# Patient Record
Sex: Male | Born: 1945 | Race: White | Hispanic: No | Marital: Married | Smoking: Former smoker
Health system: Southern US, Community
[De-identification: ages and names within clinical notes are randomized; demographics above are authoritative.]

## PROBLEM LIST (undated history)

## (undated) DIAGNOSIS — K5730 Diverticulosis of large intestine without perforation or abscess without bleeding: Secondary | ICD-10-CM

## (undated) DIAGNOSIS — R5381 Other malaise: Secondary | ICD-10-CM

## (undated) DIAGNOSIS — I10 Essential (primary) hypertension: Secondary | ICD-10-CM

## (undated) DIAGNOSIS — E119 Type 2 diabetes mellitus without complications: Secondary | ICD-10-CM

## (undated) DIAGNOSIS — J309 Allergic rhinitis, unspecified: Secondary | ICD-10-CM

## (undated) DIAGNOSIS — R58 Hemorrhage, not elsewhere classified: Secondary | ICD-10-CM

## (undated) DIAGNOSIS — I219 Acute myocardial infarction, unspecified: Secondary | ICD-10-CM

## (undated) DIAGNOSIS — I951 Orthostatic hypotension: Secondary | ICD-10-CM

## (undated) DIAGNOSIS — H269 Unspecified cataract: Secondary | ICD-10-CM

## (undated) DIAGNOSIS — T7840XA Allergy, unspecified, initial encounter: Secondary | ICD-10-CM

## (undated) DIAGNOSIS — I255 Ischemic cardiomyopathy: Secondary | ICD-10-CM

## (undated) DIAGNOSIS — F329 Major depressive disorder, single episode, unspecified: Secondary | ICD-10-CM

## (undated) DIAGNOSIS — Z95810 Presence of automatic (implantable) cardiac defibrillator: Secondary | ICD-10-CM

## (undated) DIAGNOSIS — I472 Ventricular tachycardia, unspecified: Secondary | ICD-10-CM

## (undated) DIAGNOSIS — F528 Other sexual dysfunction not due to a substance or known physiological condition: Secondary | ICD-10-CM

## (undated) DIAGNOSIS — Z860100 Personal history of colon polyps, unspecified: Secondary | ICD-10-CM

## (undated) DIAGNOSIS — M1990 Unspecified osteoarthritis, unspecified site: Secondary | ICD-10-CM

## (undated) DIAGNOSIS — Z87891 Personal history of nicotine dependence: Secondary | ICD-10-CM

## (undated) DIAGNOSIS — K683 Retroperitoneal hematoma: Secondary | ICD-10-CM

## (undated) DIAGNOSIS — K589 Irritable bowel syndrome without diarrhea: Secondary | ICD-10-CM

## (undated) DIAGNOSIS — I779 Disorder of arteries and arterioles, unspecified: Secondary | ICD-10-CM

## (undated) DIAGNOSIS — R5383 Other fatigue: Secondary | ICD-10-CM

## (undated) DIAGNOSIS — K222 Esophageal obstruction: Secondary | ICD-10-CM

## (undated) DIAGNOSIS — E785 Hyperlipidemia, unspecified: Secondary | ICD-10-CM

## (undated) DIAGNOSIS — Z9089 Acquired absence of other organs: Secondary | ICD-10-CM

## (undated) DIAGNOSIS — R351 Nocturia: Secondary | ICD-10-CM

## (undated) DIAGNOSIS — E669 Obesity, unspecified: Secondary | ICD-10-CM

## (undated) DIAGNOSIS — K219 Gastro-esophageal reflux disease without esophagitis: Secondary | ICD-10-CM

## (undated) DIAGNOSIS — I2510 Atherosclerotic heart disease of native coronary artery without angina pectoris: Secondary | ICD-10-CM

## (undated) DIAGNOSIS — F32A Depression, unspecified: Secondary | ICD-10-CM

## (undated) DIAGNOSIS — F4310 Post-traumatic stress disorder, unspecified: Secondary | ICD-10-CM

## (undated) DIAGNOSIS — F419 Anxiety disorder, unspecified: Secondary | ICD-10-CM

## (undated) DIAGNOSIS — I5022 Chronic systolic (congestive) heart failure: Secondary | ICD-10-CM

## (undated) DIAGNOSIS — C801 Malignant (primary) neoplasm, unspecified: Secondary | ICD-10-CM

## (undated) DIAGNOSIS — K649 Unspecified hemorrhoids: Secondary | ICD-10-CM

## (undated) DIAGNOSIS — I5042 Chronic combined systolic (congestive) and diastolic (congestive) heart failure: Secondary | ICD-10-CM

## (undated) DIAGNOSIS — R232 Flushing: Secondary | ICD-10-CM

## (undated) HISTORY — DX: Esophageal obstruction: K22.2

## (undated) HISTORY — DX: Personal history of colonic polyps: Z86.010

## (undated) HISTORY — DX: Personal history of colon polyps, unspecified: Z86.0100

## (undated) HISTORY — DX: Unspecified hemorrhoids: K64.9

## (undated) HISTORY — DX: Atherosclerotic heart disease of native coronary artery without angina pectoris: I25.10

## (undated) HISTORY — PX: ADENOIDECTOMY: SUR15

## (undated) HISTORY — DX: Orthostatic hypotension: I95.1

## (undated) HISTORY — DX: Irritable bowel syndrome, unspecified: K58.9

## (undated) HISTORY — DX: Essential (primary) hypertension: I10

## (undated) HISTORY — PX: SHOULDER SURGERY: SHX246

## (undated) HISTORY — PX: COLON RESECTION: SHX5231

## (undated) HISTORY — DX: Ventricular tachycardia, unspecified: I47.20

## (undated) HISTORY — DX: Diverticulosis of large intestine without perforation or abscess without bleeding: K57.30

## (undated) HISTORY — DX: Type 2 diabetes mellitus without complications: E11.9

## (undated) HISTORY — DX: Hemorrhage, not elsewhere classified: R58

## (undated) HISTORY — DX: Allergy, unspecified, initial encounter: T78.40XA

## (undated) HISTORY — PX: POLYPECTOMY: SHX149

## (undated) HISTORY — PX: CHOLECYSTECTOMY: SHX55

## (undated) HISTORY — DX: Other sexual dysfunction not due to a substance or known physiological condition: F52.8

## (undated) HISTORY — PX: ELBOW BURSA SURGERY: SHX615

## (undated) HISTORY — DX: Personal history of nicotine dependence: Z87.891

## (undated) HISTORY — DX: Disorder of arteries and arterioles, unspecified: I77.9

## (undated) HISTORY — DX: Presence of automatic (implantable) cardiac defibrillator: Z95.810

## (undated) HISTORY — PX: CORONARY ANGIOPLASTY: SHX604

## (undated) HISTORY — DX: Nocturia: R35.1

## (undated) HISTORY — DX: Other fatigue: R53.83

## (undated) HISTORY — DX: Chronic systolic (congestive) heart failure: I50.22

## (undated) HISTORY — DX: Anxiety disorder, unspecified: F41.9

## (undated) HISTORY — DX: Gastro-esophageal reflux disease without esophagitis: K21.9

## (undated) HISTORY — PX: CARPAL TUNNEL RELEASE: SHX101

## (undated) HISTORY — DX: Ventricular tachycardia: I47.2

## (undated) HISTORY — DX: Hyperlipidemia, unspecified: E78.5

## (undated) HISTORY — DX: Chronic combined systolic (congestive) and diastolic (congestive) heart failure: I50.42

## (undated) HISTORY — DX: Retroperitoneal hematoma: K68.3

## (undated) HISTORY — DX: Unspecified cataract: H26.9

## (undated) HISTORY — DX: Obesity, unspecified: E66.9

## (undated) HISTORY — DX: Acquired absence of other organs: Z90.89

## (undated) HISTORY — DX: Unspecified osteoarthritis, unspecified site: M19.90

## (undated) HISTORY — PX: TONSILLECTOMY: SUR1361

## (undated) HISTORY — DX: Malignant (primary) neoplasm, unspecified: C80.1

## (undated) HISTORY — DX: Ischemic cardiomyopathy: I25.5

## (undated) HISTORY — DX: Allergic rhinitis, unspecified: J30.9

## (undated) HISTORY — DX: Other malaise: R53.81

## (undated) HISTORY — PX: EYE SURGERY: SHX253

## (undated) HISTORY — DX: Flushing: R23.2

## (undated) SURGICAL SUPPLY — 1 items: POUCH AIGIS-R ANTIBACT ICD (Mesh General) ×1 IMPLANT

---

## 1989-08-02 ENCOUNTER — Encounter: Payer: Self-pay | Admitting: Gastroenterology

## 2000-01-20 ENCOUNTER — Emergency Department (HOSPITAL_COMMUNITY): Admission: EM | Admit: 2000-01-20 | Discharge: 2000-01-20 | Payer: Self-pay | Admitting: Emergency Medicine

## 2001-10-08 ENCOUNTER — Encounter: Payer: Self-pay | Admitting: Internal Medicine

## 2001-10-11 ENCOUNTER — Encounter: Payer: Self-pay | Admitting: Internal Medicine

## 2003-03-04 ENCOUNTER — Encounter: Payer: Self-pay | Admitting: Internal Medicine

## 2003-07-07 ENCOUNTER — Encounter: Payer: Self-pay | Admitting: Internal Medicine

## 2004-07-01 ENCOUNTER — Ambulatory Visit: Payer: Self-pay | Admitting: Internal Medicine

## 2004-08-03 ENCOUNTER — Ambulatory Visit: Payer: Self-pay | Admitting: Internal Medicine

## 2004-08-16 ENCOUNTER — Ambulatory Visit: Payer: Self-pay | Admitting: Internal Medicine

## 2004-08-26 ENCOUNTER — Ambulatory Visit: Payer: Self-pay | Admitting: Internal Medicine

## 2004-09-02 ENCOUNTER — Ambulatory Visit: Payer: Self-pay | Admitting: Internal Medicine

## 2004-10-15 ENCOUNTER — Ambulatory Visit: Payer: Self-pay | Admitting: Internal Medicine

## 2004-11-09 ENCOUNTER — Ambulatory Visit: Payer: Self-pay | Admitting: Internal Medicine

## 2004-11-28 ENCOUNTER — Emergency Department (HOSPITAL_COMMUNITY): Admission: EM | Admit: 2004-11-28 | Discharge: 2004-11-29 | Payer: Self-pay | Admitting: Emergency Medicine

## 2004-11-30 ENCOUNTER — Ambulatory Visit: Payer: Self-pay | Admitting: Internal Medicine

## 2005-01-03 ENCOUNTER — Ambulatory Visit: Payer: Self-pay | Admitting: Cardiology

## 2005-01-07 ENCOUNTER — Ambulatory Visit: Payer: Self-pay | Admitting: Internal Medicine

## 2005-02-15 ENCOUNTER — Ambulatory Visit: Payer: Self-pay | Admitting: Internal Medicine

## 2005-02-17 ENCOUNTER — Ambulatory Visit: Payer: Self-pay | Admitting: Internal Medicine

## 2005-03-14 ENCOUNTER — Ambulatory Visit: Payer: Self-pay | Admitting: Internal Medicine

## 2005-04-13 ENCOUNTER — Ambulatory Visit: Payer: Self-pay | Admitting: Internal Medicine

## 2005-04-21 ENCOUNTER — Ambulatory Visit: Payer: Self-pay | Admitting: Internal Medicine

## 2005-04-28 ENCOUNTER — Ambulatory Visit: Payer: Self-pay | Admitting: Internal Medicine

## 2005-05-11 ENCOUNTER — Ambulatory Visit: Payer: Self-pay | Admitting: Internal Medicine

## 2005-06-24 ENCOUNTER — Ambulatory Visit: Payer: Self-pay | Admitting: Internal Medicine

## 2005-06-30 ENCOUNTER — Ambulatory Visit: Payer: Self-pay | Admitting: Cardiology

## 2005-08-05 ENCOUNTER — Ambulatory Visit: Payer: Self-pay | Admitting: Internal Medicine

## 2005-08-25 ENCOUNTER — Ambulatory Visit: Payer: Self-pay | Admitting: Internal Medicine

## 2005-09-12 ENCOUNTER — Ambulatory Visit: Payer: Self-pay | Admitting: Internal Medicine

## 2005-09-13 ENCOUNTER — Ambulatory Visit: Payer: Self-pay | Admitting: Internal Medicine

## 2005-11-07 ENCOUNTER — Ambulatory Visit: Payer: Self-pay | Admitting: Internal Medicine

## 2005-11-14 ENCOUNTER — Ambulatory Visit: Payer: Self-pay | Admitting: Internal Medicine

## 2005-12-07 ENCOUNTER — Ambulatory Visit: Payer: Self-pay | Admitting: Internal Medicine

## 2005-12-14 ENCOUNTER — Ambulatory Visit: Payer: Self-pay | Admitting: Cardiology

## 2005-12-20 ENCOUNTER — Ambulatory Visit: Payer: Self-pay | Admitting: Internal Medicine

## 2005-12-22 ENCOUNTER — Ambulatory Visit: Payer: Self-pay | Admitting: Cardiology

## 2006-01-31 ENCOUNTER — Ambulatory Visit: Payer: Self-pay | Admitting: Internal Medicine

## 2006-02-01 ENCOUNTER — Ambulatory Visit: Payer: Self-pay | Admitting: Cardiology

## 2006-02-28 ENCOUNTER — Ambulatory Visit: Payer: Self-pay | Admitting: Internal Medicine

## 2006-03-13 ENCOUNTER — Ambulatory Visit: Payer: Self-pay | Admitting: Internal Medicine

## 2006-04-20 ENCOUNTER — Ambulatory Visit: Payer: Self-pay | Admitting: Internal Medicine

## 2006-06-02 ENCOUNTER — Ambulatory Visit: Payer: Self-pay | Admitting: Internal Medicine

## 2006-06-22 ENCOUNTER — Ambulatory Visit: Payer: Self-pay | Admitting: Internal Medicine

## 2006-07-18 ENCOUNTER — Ambulatory Visit: Payer: Self-pay | Admitting: Pulmonary Disease

## 2006-08-02 ENCOUNTER — Ambulatory Visit: Payer: Self-pay | Admitting: Internal Medicine

## 2006-08-08 ENCOUNTER — Ambulatory Visit: Payer: Self-pay | Admitting: Cardiology

## 2006-08-08 LAB — CONVERTED CEMR LAB
Chol/HDL Ratio, serum: 2.7
Cholesterol: 156 mg/dL (ref 0–200)
HDL: 57.7 mg/dL (ref >39.0)
LDL Cholesterol: 60 mg/dL (ref 0–99)
Total Bilirubin: 0.8 mg/dL (ref 0.3–1.2)
Total Protein: 6.7 g/dL (ref 6.0–8.3)

## 2006-08-15 ENCOUNTER — Ambulatory Visit: Payer: Self-pay | Admitting: Cardiology

## 2006-08-17 ENCOUNTER — Ambulatory Visit: Payer: Self-pay | Admitting: Internal Medicine

## 2006-08-23 ENCOUNTER — Ambulatory Visit: Payer: Self-pay | Admitting: Internal Medicine

## 2006-08-25 ENCOUNTER — Ambulatory Visit: Payer: Self-pay | Admitting: Internal Medicine

## 2006-08-31 ENCOUNTER — Ambulatory Visit: Payer: Self-pay | Admitting: Internal Medicine

## 2006-09-05 ENCOUNTER — Ambulatory Visit: Payer: Self-pay | Admitting: Internal Medicine

## 2006-09-07 ENCOUNTER — Ambulatory Visit: Payer: Self-pay | Admitting: Internal Medicine

## 2006-09-12 ENCOUNTER — Ambulatory Visit: Payer: Self-pay | Admitting: Internal Medicine

## 2006-09-18 ENCOUNTER — Ambulatory Visit: Payer: Self-pay | Admitting: Internal Medicine

## 2006-09-21 ENCOUNTER — Ambulatory Visit: Payer: Self-pay | Admitting: Internal Medicine

## 2006-09-25 ENCOUNTER — Ambulatory Visit: Payer: Self-pay | Admitting: Internal Medicine

## 2006-10-02 ENCOUNTER — Ambulatory Visit: Payer: Self-pay | Admitting: Internal Medicine

## 2006-10-06 ENCOUNTER — Ambulatory Visit: Payer: Self-pay | Admitting: Internal Medicine

## 2006-10-10 ENCOUNTER — Ambulatory Visit: Payer: Self-pay | Admitting: Internal Medicine

## 2006-10-10 LAB — CONVERTED CEMR LAB
AST: 25 units/L (ref 0–37)
BUN: 10 mg/dL (ref 6–23)
Basophils Absolute: 0.1 10*3/uL (ref 0.0–0.1)
Cholesterol: 124 mg/dL (ref 0–200)
Eosinophils Absolute: 0.1 10*3/uL (ref 0.0–0.6)
Hemoglobin: 17.7 g/dL — ABNORMAL HIGH (ref 13.0–17.0)
LDL Cholesterol: 40 mg/dL (ref 0–99)
Lymphocytes Relative: 24.1 % (ref 12.0–46.0)
Neutro Abs: 5.7 10*3/uL (ref 1.4–7.7)
Neutrophils Relative %: 67.1 % (ref 43.0–77.0)
Platelets: 181 10*3/uL (ref 150–400)
RBC: 5.84 M/uL — ABNORMAL HIGH (ref 4.22–5.81)
TSH: 0.96 microintl units/mL (ref 0.35–5.50)
Testosterone: 161.05 ng/dL — ABNORMAL LOW (ref 350.00–890)
Total CHOL/HDL Ratio: 2.5
VLDL: 35 mg/dL (ref 0–40)
Vitamin B-12: 292 pg/mL (ref 211–911)
WBC: 8.5 10*3/uL (ref 4.5–10.5)

## 2006-10-16 ENCOUNTER — Ambulatory Visit: Payer: Self-pay | Admitting: Internal Medicine

## 2006-10-18 ENCOUNTER — Ambulatory Visit: Payer: Self-pay | Admitting: Internal Medicine

## 2006-10-20 ENCOUNTER — Ambulatory Visit: Payer: Self-pay | Admitting: Internal Medicine

## 2006-10-24 ENCOUNTER — Ambulatory Visit: Payer: Self-pay | Admitting: Internal Medicine

## 2006-11-06 ENCOUNTER — Ambulatory Visit: Payer: Self-pay | Admitting: Internal Medicine

## 2006-11-16 ENCOUNTER — Ambulatory Visit: Payer: Self-pay | Admitting: Internal Medicine

## 2006-11-20 ENCOUNTER — Ambulatory Visit: Payer: Self-pay | Admitting: Internal Medicine

## 2006-11-27 ENCOUNTER — Ambulatory Visit: Payer: Self-pay | Admitting: Internal Medicine

## 2006-12-01 ENCOUNTER — Ambulatory Visit: Payer: Self-pay | Admitting: Internal Medicine

## 2006-12-06 ENCOUNTER — Ambulatory Visit: Payer: Self-pay | Admitting: Internal Medicine

## 2006-12-13 ENCOUNTER — Ambulatory Visit: Payer: Self-pay | Admitting: Internal Medicine

## 2006-12-21 ENCOUNTER — Ambulatory Visit: Payer: Self-pay | Admitting: Internal Medicine

## 2006-12-28 ENCOUNTER — Ambulatory Visit: Payer: Self-pay | Admitting: Internal Medicine

## 2007-01-03 ENCOUNTER — Ambulatory Visit: Payer: Self-pay | Admitting: Internal Medicine

## 2007-01-04 ENCOUNTER — Ambulatory Visit: Payer: Self-pay | Admitting: Internal Medicine

## 2007-01-05 ENCOUNTER — Ambulatory Visit: Payer: Self-pay | Admitting: Internal Medicine

## 2007-01-05 LAB — CONVERTED CEMR LAB
CO2: 33 meq/L — ABNORMAL HIGH (ref 19–32)
Calcium: 10.3 mg/dL (ref 8.4–10.5)
Chloride: 100 meq/L (ref 96–112)
GFR calc non Af Amer: 81 mL/min
Glucose, Bld: 123 mg/dL — ABNORMAL HIGH (ref 70–99)
PSA: 1.68 ng/mL (ref 0.10–4.00)
Sodium: 139 meq/L (ref 135–145)
TSH: 1.07 microintl units/mL (ref 0.35–5.50)
Testosterone: 203.26 ng/dL — ABNORMAL LOW (ref 350.00–890)

## 2007-01-10 ENCOUNTER — Ambulatory Visit: Payer: Self-pay | Admitting: Internal Medicine

## 2007-01-16 ENCOUNTER — Ambulatory Visit: Payer: Self-pay | Admitting: Internal Medicine

## 2007-01-23 ENCOUNTER — Ambulatory Visit: Payer: Self-pay | Admitting: Internal Medicine

## 2007-01-24 ENCOUNTER — Encounter: Payer: Self-pay | Admitting: Endocrinology

## 2007-01-24 DIAGNOSIS — Z9089 Acquired absence of other organs: Secondary | ICD-10-CM | POA: Insufficient documentation

## 2007-01-24 DIAGNOSIS — Z860100 Personal history of colon polyps, unspecified: Secondary | ICD-10-CM | POA: Insufficient documentation

## 2007-01-24 DIAGNOSIS — K5730 Diverticulosis of large intestine without perforation or abscess without bleeding: Secondary | ICD-10-CM | POA: Insufficient documentation

## 2007-01-24 DIAGNOSIS — I25119 Atherosclerotic heart disease of native coronary artery with unspecified angina pectoris: Secondary | ICD-10-CM | POA: Insufficient documentation

## 2007-01-24 DIAGNOSIS — K589 Irritable bowel syndrome without diarrhea: Secondary | ICD-10-CM | POA: Insufficient documentation

## 2007-01-24 DIAGNOSIS — I2510 Atherosclerotic heart disease of native coronary artery without angina pectoris: Secondary | ICD-10-CM

## 2007-01-24 DIAGNOSIS — F528 Other sexual dysfunction not due to a substance or known physiological condition: Secondary | ICD-10-CM | POA: Insufficient documentation

## 2007-01-24 DIAGNOSIS — E785 Hyperlipidemia, unspecified: Secondary | ICD-10-CM | POA: Insufficient documentation

## 2007-01-24 DIAGNOSIS — K219 Gastro-esophageal reflux disease without esophagitis: Secondary | ICD-10-CM | POA: Insufficient documentation

## 2007-01-24 DIAGNOSIS — E669 Obesity, unspecified: Secondary | ICD-10-CM | POA: Insufficient documentation

## 2007-01-24 DIAGNOSIS — I10 Essential (primary) hypertension: Secondary | ICD-10-CM | POA: Insufficient documentation

## 2007-01-31 ENCOUNTER — Ambulatory Visit: Payer: Self-pay | Admitting: Internal Medicine

## 2007-02-05 ENCOUNTER — Ambulatory Visit: Payer: Self-pay | Admitting: Internal Medicine

## 2007-02-13 ENCOUNTER — Ambulatory Visit: Payer: Self-pay | Admitting: Internal Medicine

## 2007-02-23 ENCOUNTER — Ambulatory Visit: Payer: Self-pay | Admitting: Internal Medicine

## 2007-03-01 ENCOUNTER — Ambulatory Visit: Payer: Self-pay | Admitting: Internal Medicine

## 2007-03-09 ENCOUNTER — Ambulatory Visit: Payer: Self-pay | Admitting: Internal Medicine

## 2007-03-16 ENCOUNTER — Ambulatory Visit: Payer: Self-pay | Admitting: Internal Medicine

## 2007-03-20 ENCOUNTER — Ambulatory Visit: Payer: Self-pay | Admitting: Internal Medicine

## 2007-03-26 ENCOUNTER — Ambulatory Visit: Payer: Self-pay | Admitting: Internal Medicine

## 2007-03-26 ENCOUNTER — Encounter: Payer: Self-pay | Admitting: Internal Medicine

## 2007-03-29 ENCOUNTER — Ambulatory Visit: Payer: Self-pay | Admitting: Internal Medicine

## 2007-03-31 ENCOUNTER — Emergency Department (HOSPITAL_COMMUNITY): Admission: EM | Admit: 2007-03-31 | Discharge: 2007-03-31 | Payer: Self-pay | Admitting: Emergency Medicine

## 2007-04-02 ENCOUNTER — Ambulatory Visit: Payer: Self-pay | Admitting: Internal Medicine

## 2007-04-13 ENCOUNTER — Ambulatory Visit: Payer: Self-pay | Admitting: Internal Medicine

## 2007-04-18 ENCOUNTER — Ambulatory Visit: Payer: Self-pay | Admitting: Internal Medicine

## 2007-04-24 ENCOUNTER — Ambulatory Visit: Payer: Self-pay | Admitting: Internal Medicine

## 2007-05-04 ENCOUNTER — Ambulatory Visit: Payer: Self-pay | Admitting: Internal Medicine

## 2007-05-14 ENCOUNTER — Ambulatory Visit: Payer: Self-pay | Admitting: Internal Medicine

## 2007-05-25 ENCOUNTER — Ambulatory Visit: Payer: Self-pay | Admitting: Internal Medicine

## 2007-06-01 ENCOUNTER — Ambulatory Visit: Payer: Self-pay | Admitting: Internal Medicine

## 2007-06-11 ENCOUNTER — Emergency Department (HOSPITAL_COMMUNITY): Admission: EM | Admit: 2007-06-11 | Discharge: 2007-06-12 | Payer: Self-pay | Admitting: Emergency Medicine

## 2007-06-11 ENCOUNTER — Ambulatory Visit: Payer: Self-pay | Admitting: Internal Medicine

## 2007-06-13 ENCOUNTER — Ambulatory Visit: Payer: Self-pay | Admitting: Internal Medicine

## 2007-06-18 ENCOUNTER — Encounter: Payer: Self-pay | Admitting: Internal Medicine

## 2007-06-20 ENCOUNTER — Ambulatory Visit: Payer: Self-pay | Admitting: Pulmonary Disease

## 2007-06-20 ENCOUNTER — Ambulatory Visit: Payer: Self-pay | Admitting: Internal Medicine

## 2007-06-21 ENCOUNTER — Ambulatory Visit: Payer: Self-pay | Admitting: Internal Medicine

## 2007-06-29 ENCOUNTER — Ambulatory Visit: Payer: Self-pay | Admitting: Internal Medicine

## 2007-07-09 ENCOUNTER — Ambulatory Visit: Payer: Self-pay | Admitting: Internal Medicine

## 2007-07-10 ENCOUNTER — Ambulatory Visit: Payer: Self-pay | Admitting: Internal Medicine

## 2007-07-17 ENCOUNTER — Ambulatory Visit: Payer: Self-pay | Admitting: Internal Medicine

## 2007-07-23 ENCOUNTER — Encounter: Payer: Self-pay | Admitting: Internal Medicine

## 2007-08-09 ENCOUNTER — Telehealth: Payer: Self-pay | Admitting: Internal Medicine

## 2007-08-09 ENCOUNTER — Ambulatory Visit: Payer: Self-pay | Admitting: Internal Medicine

## 2007-08-16 ENCOUNTER — Ambulatory Visit: Payer: Self-pay | Admitting: Internal Medicine

## 2007-08-16 DIAGNOSIS — J3089 Other allergic rhinitis: Secondary | ICD-10-CM | POA: Insufficient documentation

## 2007-08-27 ENCOUNTER — Ambulatory Visit: Payer: Self-pay | Admitting: Internal Medicine

## 2007-09-07 ENCOUNTER — Ambulatory Visit: Payer: Self-pay | Admitting: Internal Medicine

## 2007-09-07 DIAGNOSIS — E1169 Type 2 diabetes mellitus with other specified complication: Secondary | ICD-10-CM | POA: Insufficient documentation

## 2007-09-07 DIAGNOSIS — R5381 Other malaise: Secondary | ICD-10-CM | POA: Insufficient documentation

## 2007-09-07 DIAGNOSIS — R5383 Other fatigue: Secondary | ICD-10-CM | POA: Insufficient documentation

## 2007-09-07 DIAGNOSIS — E119 Type 2 diabetes mellitus without complications: Secondary | ICD-10-CM

## 2007-09-07 LAB — CONVERTED CEMR LAB
ALT: 39 units/L (ref 0–53)
Albumin: 4.2 g/dL (ref 3.5–5.2)
Alkaline Phosphatase: 75 units/L (ref 39–117)
Bilirubin, Direct: 0.1 mg/dL (ref 0.0–0.3)
CO2: 27 meq/L (ref 19–32)
Cholesterol: 135 mg/dL (ref 0–200)
Folate: 9.3 ng/mL
GFR calc Af Amer: 111 mL/min
GFR calc non Af Amer: 91 mL/min
HDL: 49.5 mg/dL (ref >39.0)
Hgb A1c MFr Bld: 6.6 % — ABNORMAL HIGH (ref 4.6–6.0)
LDL Cholesterol: 46 mg/dL (ref 0–99)
Potassium: 4 meq/L (ref 3.5–5.1)
Vitamin B-12: 250 pg/mL (ref 211–911)

## 2007-09-09 ENCOUNTER — Encounter: Payer: Self-pay | Admitting: Internal Medicine

## 2007-09-14 ENCOUNTER — Ambulatory Visit: Payer: Self-pay | Admitting: Internal Medicine

## 2007-09-17 ENCOUNTER — Ambulatory Visit: Payer: Self-pay | Admitting: Internal Medicine

## 2007-09-21 ENCOUNTER — Ambulatory Visit: Payer: Self-pay | Admitting: Internal Medicine

## 2007-09-27 ENCOUNTER — Telehealth: Payer: Self-pay | Admitting: Internal Medicine

## 2007-10-01 ENCOUNTER — Ambulatory Visit: Payer: Self-pay | Admitting: Internal Medicine

## 2007-10-09 ENCOUNTER — Telehealth: Payer: Self-pay | Admitting: Internal Medicine

## 2007-10-09 ENCOUNTER — Ambulatory Visit: Payer: Self-pay | Admitting: Internal Medicine

## 2007-10-11 ENCOUNTER — Ambulatory Visit: Payer: Self-pay | Admitting: Internal Medicine

## 2007-10-23 ENCOUNTER — Ambulatory Visit: Payer: Self-pay | Admitting: Internal Medicine

## 2007-11-02 ENCOUNTER — Ambulatory Visit: Payer: Self-pay | Admitting: Internal Medicine

## 2007-11-07 ENCOUNTER — Ambulatory Visit: Payer: Self-pay | Admitting: Internal Medicine

## 2007-11-22 ENCOUNTER — Ambulatory Visit: Payer: Self-pay | Admitting: Internal Medicine

## 2007-12-11 ENCOUNTER — Ambulatory Visit: Payer: Self-pay | Admitting: Internal Medicine

## 2007-12-11 DIAGNOSIS — R232 Flushing: Secondary | ICD-10-CM | POA: Insufficient documentation

## 2007-12-11 DIAGNOSIS — R351 Nocturia: Secondary | ICD-10-CM | POA: Insufficient documentation

## 2007-12-11 LAB — CONVERTED CEMR LAB
CO2: 31 meq/L (ref 19–32)
Chloride: 99 meq/L (ref 96–112)
Cortisol, Plasma: 1.8 ug/dL
Creatinine, Ser: 1 mg/dL (ref 0.4–1.5)
Direct LDL: 54.9 mg/dL
GFR calc Af Amer: 98 mL/min
Glucose, Bld: 141 mg/dL — ABNORMAL HIGH (ref 70–99)
HDL: 57.3 mg/dL (ref >39.0)
Potassium: 3.7 meq/L (ref 3.5–5.1)
Sodium: 137 meq/L (ref 135–145)
Total CHOL/HDL Ratio: 2.5
Triglycerides: 255 mg/dL (ref 0–149)
VLDL: 51 mg/dL — ABNORMAL HIGH (ref 0–40)

## 2007-12-13 ENCOUNTER — Ambulatory Visit: Payer: Self-pay | Admitting: *Deleted

## 2007-12-14 ENCOUNTER — Encounter: Payer: Self-pay | Admitting: Internal Medicine

## 2007-12-14 ENCOUNTER — Inpatient Hospital Stay (HOSPITAL_COMMUNITY): Admission: EM | Admit: 2007-12-14 | Discharge: 2007-12-14 | Payer: Self-pay | Admitting: Emergency Medicine

## 2007-12-14 LAB — CONVERTED CEMR LAB
5-HIAA, 24 Hr Urine: 1.8 mg/(24.h) (ref <=6.0)
Epinephrine 24 Hr Urine: 1 mcg/24hr (ref <20)
Norepinephrine 24 Hr Urine: 52 mcg/24hr (ref <80)
Normetanephrine, 24H Ur: 152 (ref 122–676)

## 2007-12-15 ENCOUNTER — Encounter: Payer: Self-pay | Admitting: Internal Medicine

## 2007-12-18 ENCOUNTER — Telehealth: Payer: Self-pay | Admitting: Internal Medicine

## 2007-12-20 ENCOUNTER — Ambulatory Visit: Payer: Self-pay

## 2007-12-20 ENCOUNTER — Encounter: Payer: Self-pay | Admitting: Internal Medicine

## 2007-12-26 ENCOUNTER — Ambulatory Visit: Payer: Self-pay | Admitting: Internal Medicine

## 2008-01-01 ENCOUNTER — Ambulatory Visit: Payer: Self-pay | Admitting: Cardiology

## 2008-01-31 ENCOUNTER — Ambulatory Visit: Payer: Self-pay | Admitting: Internal Medicine

## 2008-02-04 ENCOUNTER — Telehealth: Payer: Self-pay | Admitting: Internal Medicine

## 2008-02-05 ENCOUNTER — Telehealth: Payer: Self-pay | Admitting: Internal Medicine

## 2008-08-27 ENCOUNTER — Encounter: Payer: Self-pay | Admitting: Internal Medicine

## 2009-01-01 ENCOUNTER — Telehealth: Payer: Self-pay | Admitting: Internal Medicine

## 2009-01-27 ENCOUNTER — Ambulatory Visit: Payer: Self-pay | Admitting: Cardiology

## 2009-01-27 ENCOUNTER — Encounter: Payer: Self-pay | Admitting: Cardiology

## 2010-06-18 ENCOUNTER — Ambulatory Visit: Payer: Self-pay | Admitting: Internal Medicine

## 2010-06-18 DIAGNOSIS — K5732 Diverticulitis of large intestine without perforation or abscess without bleeding: Secondary | ICD-10-CM | POA: Insufficient documentation

## 2010-09-28 NOTE — Procedures (Signed)
Summary: EGD/[place]  E[facility]   Imported By: Sherian Rein 06/21/2010 12:01:18  _____________________________________________________________________  External Attachment:    Type:   Image     Comment:   External Document

## 2010-09-28 NOTE — Procedures (Signed)
Summary: EGD   EGD  Procedure date:  03/26/2007  Findings:      Location: [facility]   [name], Cameron Daniel. MRN:  Procedure Procedures: Panendoscopy (EGD) CPT: 43235.    with biopsy(s)/brushing(s). CPT: [id number].    with maloney dilation of the esophagus Personnel: Endoscopist: Wilhemina Bonito. Marina Goodell, MD.  Exam Location: Exam performed in Outpatient Clinic. Outpatient  Patient Consent: Procedure, Alternatives, Risks and Benefits discussed, consent obtained, from patient. Consent was obtained by the RN.  Indications  Therapeutics: Reason for exam: Esophageal dilation.  Symptoms: Dysphagia. Reflux symptoms  History  Current Medications: Patient is not currently taking Coumadin.  Pre-Exam Physical: Performed Mar 26, 2007  Cardio-pulmonary exam, HEENT exam, Abdominal exam, Mental status exam WNL.  Comments: Pt. history reviewed/updated, physical exam performed prior to initiation of sedation?yes Exam Exam Info: Maximum depth of insertion Duodenum, intended Duodenum. Patient position: on left side. Vocal cords visualized. Gastric retroflexion performed. Images taken. ASA Classification: II. Tolerance: excellent.  Sedation Meds: Patient assessed and found to be appropriate for moderate (conscious) sedation. Residual sedation present from prior procedure today. Cetacaine Spray 2 sprays Versed 2 mg. given IV.  Monitoring: BP and pulse monitoring done. Oximetry used. Supplemental O2 given  Findings STRICTURE / STENOSIS: Stricture in Distal Esophagus.  Constriction: partial. Etiology: benign due to reflux. 40 cm from mouth. Lumen diameter is 15 mm. ICD9: Esophageal Stricture: 530.3. Comment: No Barrett's or esophagitis.  - Dilation: Distal Esophagus. Maloney dilator used, Diameter: 54 F, No Resistance, No Heme present on extraction. Patient tolerance excellent.  - Normal: Fundus to Duodenal 2nd Portion. Biopsy/Normal taken. Comments: mild antral  erythema (CLO BX).   Assessment  Diagnoses: 530.3: Esophageal Stricture. s/p dilation.  530.81: GERD.   Comments: Mild antral erythema r/o H. pylori Events  Unplanned Intervention: No unplanned interventions were required.  Unplanned Events: There were no complications. Plans Medication(s): Continue current medications.  Disposition: After procedure patient sent to recovery. After recovery patient sent home.  Scheduling: Follow-up prn.  This report was created from the original endoscopy report, which was reviewed and signed by the above listed endoscopist.

## 2010-09-28 NOTE — Consult Note (Signed)
Summary: [facility]   Imported By: Sherian Rein 06/21/2010 12:03:31  _____________________________________________________________________  External Attachment:    Type:   Image     Comment:   External Document

## 2010-09-28 NOTE — Procedures (Signed)
Summary: Colon   Colonoscopy  Procedure date:  03/26/2007  Findings:      Location:  [facility].   Patient Name: Cameron, Daniel. MRN:  Procedure Procedures: Colonoscopy CPT: [phone].    with polypectomy. CPT: A3573898.  Personnel: Endoscopist: Wilhemina Bonito. Marina Goodell, MD.  Exam Location: Exam performed in Outpatient Clinic. Outpatient  Patient Consent: Procedure, Alternatives, Risks and Benefits discussed, consent obtained, from patient. Consent was obtained by the RN.  Indications  Surveillance of: Adenomatous Polyp(s). This is an initial surveillance exam. Initial polypectomy was performed in 2003. in Feb. 3 or more Polyps were found at Index Exam. Largest polyp removed was 6 to 9 mm. Prior polyp located in both proximal and distal colon. Pathology of worst  polyp: tubular adenoma.  History  Current Medications: Patient is not currently taking Coumadin.  Pre-Exam Physical: Performed Mar 26, 2007. Cardio-pulmonary exam, Rectal exam, HEENT exam , Abdominal exam, Mental status exam WNL.  Comments: Pt. history reviewed/updated, physical exam performed prior to initiation of sedation?yes Exam Exam: Extent of exam reached: Cecum, extent intended: Cecum.  The cecum was identified by appendiceal orifice and IC valve. Patient position: on left side. Time to Cecum: 00:02:44. Time for Withdrawl: 00:08:47. Colon retroflexion performed. Images taken. ASA Classification: II. Tolerance: excellent.  Monitoring: Pulse and BP monitoring, Oximetry used. Supplemental O2 given.  Colon Prep Used osmo prep for colon prep. Prep results: good.  Sedation Meds: Patient assessed and found to be appropriate for moderate (conscious) sedation. Fentanyl 75 mcg. given IV. Versed 7 mg. given IV.  Findings MULTIPLE POLYPS: Hepatic Flexure. removed, Polyp retrieved, 3 polyps Polyps sent to pathology. ICD9: Colon Polyps: 211.3. Comments: 3mm, 38mm,4mm.  NORMAL EXAM: Cecum to Rectum.  -  DIVERTICULOSIS: Cecum to Sigmoid Colon. ICD9: Diverticulosis, Colon: 562.10. Comments: marked changes.   Assessment  Diagnoses: 211.3: Colon Polyps. x 3.  562.10: Diverticulosis, Colon.  455.6: Hemorrhoids, Internal and  External. small.   Events  Unplanned Interventions: No intervention was required.  Unplanned Events: There were no complications. Plans Disposition: After procedure patient sent remain in endo suite for second procedure.  Scheduling/Referral: Colonoscopy, to Wilhemina Bonito. Marina Goodell, MD, in 5 years,    This report was created from the original endoscopy report, which was reviewed and signed by the above listed endoscopist.

## 2010-09-28 NOTE — Procedures (Signed)
Summary: Colon   Colonoscopy  Procedure date:  07/07/2003  Findings:      Location:  [facility].    Colonoscopy  Procedure date:  07/07/2003  Findings:      Location:  [facility].   [name], Cameron Daniel. MRN:  Procedure Procedures: Colonoscopy CPT: [phone].    with polypectomy. CPT: A3573898.  Personnel: Endoscopist: Wilhemina Bonito. Marina Goodell, MD.  Exam Location: Exam performed in Outpatient Clinic. Outpatient  Patient Consent: Procedure, Alternatives, Risks and Benefits discussed, consent obtained, from patient. Consent was obtained by the RN.  Indications  Surveillance of: Adenomatous Polyp(s). This is an initial surveillance exam. Initial polypectomy was performed in 2003. in Feb. 3 or more Polyps were found at Index Exam. Largest polyp removed was 6 to 9 mm. Prior polyp located in both proximal and distal colon. Pathology of worst  polyp: tubular adenoma.  History  Current Medications: Patient is not currently taking Coumadin.  Pre-Exam Physical: Performed Jul 07, 2003. Entire physical exam was normal.  Exam Exam: Extent of exam reached: Cecum, extent intended: Cecum.  The cecum was identified by appendiceal orifice and IC valve. Patient position: on left side. Colon retroflexion performed. Images taken. ASA Classification: II. Tolerance: excellent.  Monitoring: Pulse and BP monitoring, Oximetry used. Supplemental O2 given.  Colon Prep Used Visicol for colon prep. Prep results: excellent.  Sedation Meds: Patient assessed and found to be appropriate for moderate (conscious) sedation. Fentanyl 100 mcg. given IV. Versed 5 mg. given IV.  Findings - MULTIPLE POLYPS: Transverse Colon. minimum size 3 mm, maximum size 4 mm. Procedure:  snare without cautery, removed, Polyp retrieved, 3 polyps Polyps sent to pathology. ICD9: Colon Polyps: 211.3.  NORMAL EXAM: Cecum to Rectum.  - DIVERTICULOSIS: Ascending Colon to Sigmoid Colon.  ICD9: Diverticulosis, Colon: 562.10.  POLYP: Sigmoid Colon, diminutive, Procedure:  snare with cautery, removed, retrieved, Polyp sent to pathology.   Assessment Abnormal examination, see findings above.  Diagnoses: 211.3: Colon Polyps.  562.10: Diverticulosis, Colon.   Events  Unplanned Interventions: No intervention was required.  Unplanned Events: There were no complications. Plans Patient Education: Patient given standard instructions for: Polyps.  Disposition: After procedure patient sent to recovery. After recovery patient sent home.  Scheduling/Referral: Colonoscopy, to Wilhemina Bonito. Marina Goodell, MD, in 3 years,    This report was created from the original endoscopy report, which was reviewed and signed by the above listed endoscopist.

## 2010-09-28 NOTE — Assessment & Plan Note (Signed)
Summary: DIVERTICULITIS FLARE     History of Present Illness Visit Type: Follow-up Visit Primary GI MD: Yancey Flemings MD [facility]  Requesting Provider: na Chief Complaint: Pt c/o lower abd pain and lots of gas. Patient states that he found Flagyl and started taking that and finished them and started feeling better   History of Present Illness:   65 year old with coronary artery disease, hypertension, hyperlipidemia, obesity, GERD, colon polyps, diverticulosis. He also has a history of irritable bowel. He presents today with complaints of lower abdominal pain. He feels this may represent diverticulitis. No fevers. He did take a days worth of Flagyl and stated that this helped. Still with some discomfort. Also complains of increased intestinal gas. No nausea or vomiting. Normal bowel movements. Last colonoscopy in 2008. Due for followup in 2013. Other questions regarding anhedonia. He receives significant medical care at the [facility]. He requests H&H substitute for his pantoprazole.Marland Kitchen   GI Review of Systems    Reports abdominal pain and  bloating.     Location of  Abdominal pain: lower abdomen.    Denies acid reflux, belching, chest pain, dysphagia with liquids, dysphagia with solids, heartburn, loss of appetite, nausea, vomiting, vomiting blood, weight loss, and  weight gain.      Reports diverticulosis.     Denies anal fissure, black tarry stools, change in bowel habit, constipation, diarrhea, fecal incontinence, heme positive stool, hemorrhoids, irritable bowel syndrome, jaundice, light color stool, liver problems, rectal bleeding, and  rectal pain.    Current Medications (verified): 1)  Aspirin 81 Mg  Tbec (Aspirin) .... Take 1 By Mouth Once Daily 2)  Pantoprazole Sodium 40 Mg Tbec (Pantoprazole Sodium) .... One Tablet By Mouth Once Daily 3)  Testosterone Cypionate 100 Mg/ml Oil (Testosterone Cypionate) .... Injection Every Other Week 4)  Cholestyramine 4 Gm   Pack (Cholestyramine) .Marland Kitchen.. 1 Packet in Water Dialy 5)  Nitroglycerin 0.4 Mg Subl (Nitroglycerin) .... One Tablet Under Tongue Every 5 Minutes As Needed For Chest Pain---May Repeat Times Three 6)  Zocor 20 Mg Tabs (Simvastatin) .... Take 1 1/2 Tablet Once Daily 7)  Clarinex 5 Mg Tabs (Desloratadine) .... Take 1 Tablet By Mouth Once A Day 8)  Benicar 40 Mg Tabs (Olmesartan Medoxomil) .... One Tablet By Mouth Once Daily 9)  Actos 15 Mg Tabs (Pioglitazone Hcl) .... One Tablet By Mouth Once Daily  Allergies (verified): 1)  ! Sulfa 2)  ! Pcn 3)  ! Doxazosin Mesylate (Doxazosin Mesylate)  Past History:  Past Medical History:  CORONARY ARTERY DISEASE (ICD-414.00) HYPERTENSION (ICD-401.9) HYPERLIPIDEMIA (ICD-272.4) OBESITY (ICD-278.00) GERD (ICD-530.81) NOCTURIA (ICD-788.43) FACIAL FLUSHING (ICD-782.62) OTHER MALAISE AND FATIGUE (ICD-780.79) DIABETES MELLITUS, TYPE II (ICD-250.00) ALLERGIC RHINITIS (ICD-477.9) CHOLECYSTECTOMY, HX OF (ICD-V45.79) ERECTILE DYSFUNCTION (ICD-302.72) IRRITABLE BOWEL SYNDROME (ICD-564.1) DIVERTICULOSIS, COLON (ICD-562.10) COLONIC POLYPS, HX OF (ICD-V12.72)  Past Surgical History: Reviewed history from 01/22/2009 and no changes required. Surgical excision of (R) Elbow Bursa  percutaneous coronaryintervention in 1985.  Family History: Reviewed history from 01/22/2009 and no changes required. father- deceased @ 66; lung cancer mother- deceased @ 45: CHF, CAD, MVR Neg- colon cancer, DM, CAD  Social History: Reviewed history from 01/22/2009 and no changes required. HSG, College 2 years [employer] 2 years married '76 - 4 years divorced;  married '82-6 years divorced; married '92 - 1 son '81 work: Forensic psychologist business.  Review of Systems       The patient complains of hearing problems.  The patient denies allergy/sinus, anemia, anxiety-new, arthritis/joint pain,  back pain, blood in urine, breast changes/lumps, change in vision, confusion, cough, coughing up blood,  depression-new, fainting, fatigue, fever, headaches-new, heart murmur, heart rhythm changes, itching, menstrual pain, muscle pains/cramps, night sweats, nosebleeds, pregnancy symptoms, shortness of breath, skin rash, sleeping problems, sore throat, swelling of feet/legs, swollen lymph glands, thirst - excessive , urination - excessive , urination changes/pain, urine leakage, vision changes, and voice change.    Vital Signs:  Patient profile:   65 year old male Height:      71 inches Weight:      249 pounds BMI:     34.85 BSA:     2.32 Pulse rate:   64 / minute Pulse rhythm:   regular BP sitting:   142 / 86  (left arm) Cuff size:   large  Vitals Entered By: Ok Anis CMA (June 18, 2010 9:36 AM)  Physical Exam  General:  Well developed,obese, well nourished, no acute distress. Head:  Normocephalic and atraumatic. Eyes:  PERRLA, no icterus. Ears:  decreased hearing bilaterally Mouth:  No deformity or lesions. Neck:  Supple; no masses or thyromegaly. Lungs:  Clear throughout to auscultation. Heart:  Regular rate and rhythm; no murmurs, rubs,  or bruits. Abdomen:  obese soft with mild tenderness in the lower abdomen to palpation. No rebound. Bowel sounds heard. No hepatosplenomegaly or hernia. Msk:  Symmetrical with no gross deformities. Normal posture. Pulses:  Normal pulses noted. Extremities:  No clubbing, cyanosis, edema or deformities noted. Neurologic:  Alert and  oriented x4. Skin:  Intact without significant lesions or rashes. Psych:  Alert and cooperative. Normal mood and affect.   Impression & Recommendations:  Problem # 1:  DIVERTICULITIS-COLON (ICD-562.11) lower abdominal pain in a patient with pan diverticulosis and irritable bowel syndrome. Some mild tenderness on exam today. May have had a mild bout of diverticulitis. He did take one daily Flagyl.  Plan: #1. Metronidazole 250 mg p.o. t.i.d. x1 week. Prescribed electronically. One refill. #2. High-fiber diet  after symptom-free #3. Followup as needed.  Problem # 2:  GERD (ICD-530.81) asymptomatic on pantoprazole. Wants to change PPI for cost reasons.  Plan: #1. Prescribed generic omeprazole 40 mg daily  Problem # 3:  COLONIC POLYPS, HX OF (ICD-V12.72) history of adenomatous colon polyps in 2003. Last followup 2008 with small polyps. Followup do routinely around 2013  Patient Instructions: 1)  Metronidazole 250 mg 1 by mouth three times a day #21 x 1 RF. 2)  [facility]  3)  The medication list was reviewed and reconciled.  All changed / newly prescribed medications were explained.  A complete medication list was provided to the patient / caregiver. Prescriptions: METRONIDAZOLE 250 MG TABS (METRONIDAZOLE) 1 by mouth three times a day  #21 x 1   Entered by:   Milford Cage NCMA   Authorized by:   Hilarie Fredrickson MD   Signed by:   Milford Cage NCMA on 06/18/2010   Method used:   Print then Give to Patient   RxID:   [id number] METRONIDAZOLE 250 MG TABS (METRONIDAZOLE) 1 by mouth three times a day  #21 x 1   Entered by:   Milford Cage NCMA   Authorized by:   Hilarie Fredrickson MD   Signed by:   Milford Cage NCMA on 06/18/2010   Method used:   Electronically to        [id number] (retail)       [address]       [place], [place]  [address]       Ph: [phone] or [phone]       Fax: [phone]   RxID:   [id number]  Pt. wanted Rx. printed and given to him.  Milford Cage [facility]  June 18, 2010 10:16 AM

## 2010-09-28 NOTE — Procedures (Signed)
Summary: Colon   Colonoscopy  Procedure date:  10/11/2001  Findings:      Location:  [facility].   [name], Cameron Daniel. MRN:  Procedure Procedures: Colonoscopy CPT: [phone].    with polypectomy. CPT: A3573898.  Personnel: Endoscopist: Wilhemina Bonito. Marina Goodell, MD.  Exam Location: Exam performed in Outpatient Clinic. Outpatient  Patient Consent: Procedure, Alternatives, Risks and Benefits discussed, consent obtained, from patient. Consent was obtained by the RN.  Indications  Average Risk Screening Routine.  History  Pre-Exam Physical: Performed Oct 11, 2001. Entire physical exam was normal.  Exam Exam: Extent of exam reached: Cecum, extent intended: Cecum.  The cecum was identified by appendiceal orifice and IC valve. Patient position: on left side. Colon retroflexion performed. Images taken. ASA Classification: I. Tolerance: excellent.  Monitoring: Pulse and BP monitoring, Oximetry used. Supplemental O2 given.  Colon Prep Used Golytely for colon prep. Prep results: good.  Sedation Meds: Patient assessed and found to be appropriate for moderate (conscious) sedation. Fentanyl 100 mcg. given IV. Versed 5 mg. given IV.  Findings POLYP: Transverse Colon, Maximum size: 5 mm. sessile polyp. Procedure:  snare with cautery, removed, retrieved, Polyp sent to pathology. ICD9: Colon Polyps: 211.3.  POLYP: Transverse Colon, Maximum size: 5 mm. sessile polyp. Procedure:  snare with cautery, removed, not retrieved, ICD9: Colon Polyps: 211. 3.  POLYP: Descending Colon, Maximum size: 5 mm. sessile polyp. Procedure:  snare with cautery, removed, retrieved, sent to pathology. ICD9: Colon Polyps: 211.3.  POLYP: Ascending Colon, Maximum size: 7 mm. sessile polyp. Procedure:  snare with cautery, removed, retrieved, sent to pathology. ICD9: Colon Polyps: 211.3.  - DIVERTICULOSIS: Descending Colon to Sigmoid Colon. ICD9: Diverticulosis, Colon: 562.10.  NORMAL EXAM:  Cecum.  POLYP: Sigmoid Colon, Maximum size: 5 mm. sessile polyp. Procedure:  snare with cautery, removed, retrieved, sent to pathology. ICD9: Colon Polyps: 211.3.  POLYP: Rectum, Maximum size: 5 mm. sessile polyp. Procedure:  snare with cautery, removed, retrieved, sent to pathology. ICD9: Colon Polyps: 211.3.  NORMAL EXAM: Rectum.   Assessment Abnormal examination, see findings above.  Diagnoses: 562.10: Diverticulosis, Colon.  211.3: Colon Polyps.   Events  Unplanned Interventions: No intervention was required.  Unplanned Events: There were no complications. Plans  Post Exam Instructions: No aspirin or non-steroidal containing medications: 2 weeks.  Patient Education: Patient given standard instructions for: Polyps. Diverticulosis.  Disposition: After procedure patient sent to recovery. After recovery patient sent home.  Scheduling/Referral: Colonoscopy, to Wilhemina Bonito. Marina Goodell, MD, in 18 months if polyps adenomatous. ,    This report was created from the original endoscopy report, which was reviewed and signed by the above listed endoscopist.

## 2010-09-28 NOTE — Progress Notes (Signed)
Summary: [facility]   Imported By: Sherian Rein 06/21/2010 12:05:23  _____________________________________________________________________  External Attachment:    Type:   Image     Comment:   External Document

## 2010-12-23 ENCOUNTER — Telehealth: Payer: Self-pay | Admitting: Internal Medicine

## 2010-12-23 NOTE — Telephone Encounter (Signed)
Pt states he is having problems with lots of gas and bloating, some diarrhea. States he has a fullness across the top of his stomach. Pt states he is taking Nexium 40mg  2, bid that he gets from the [place]. Pt states Dr. Marina Goodell gave him something in the past that helped. Pt was given Metronidazole 250mg  tid. Pt isn't sure if this would help or not. Dr. Marina Goodell please advise.

## 2010-12-23 NOTE — Telephone Encounter (Signed)
Ok to retreat with metronidazole 250mg  tid x 10 days

## 2010-12-24 MED ORDER — METRONIDAZOLE 250 MG PO TABS: 250.0000 mg | ORAL_TABLET | Freq: Three times a day (TID) | ORAL | Status: DC

## 2010-12-24 MED ORDER — METRONIDAZOLE 250 MG PO TABS: 250.0000 mg | ORAL_TABLET | Freq: Three times a day (TID) | ORAL | Status: AC

## 2010-12-24 NOTE — Telephone Encounter (Signed)
Pt aware of Dr. Lamar Sprinkles recommendations. Rx sent to pharmacy.

## 2011-01-07 ENCOUNTER — Telehealth: Payer: Self-pay | Admitting: Internal Medicine

## 2011-01-07 NOTE — Telephone Encounter (Signed)
Pt is having a lot of problems with gas, bloating, and abdominal pain. Pt would like to see Dr. Marina Goodell. Appt given to pt for 01/10/11@8 :30am with Dr. Marina Goodell. Pt aware of appt date and time.

## 2011-01-10 ENCOUNTER — Ambulatory Visit: Payer: Self-pay | Admitting: Internal Medicine

## 2011-01-11 NOTE — H&P (Signed)
NAME:  TRUETT, MCFARLAN NO.:  [id number]   MEDICAL RECORD NO.:  [id number]          PATIENT TYPE:  INP   LOCATION:  2004                         FACILITY:  [facility]   PHYSICIAN:  Rod Holler, MD     DATE OF BIRTH:  27-Jan-1946   DATE OF ADMISSION:  12/13/2007  DATE OF DISCHARGE:                              HISTORY & PHYSICAL   ATTENDING CARDIOLOGIST:  Arturo Morton. Riley Kill, MD, [facility]   CHIEF COMPLAINT:  Not feeling well.   HISTORY:  Mr. 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  He states that over the past couple weeks he has generally  not felt well.  He has episodes where he feels like his face is flushed,  with associated elevation of his blood pressure, also with complaints of  PVC-like symptoms of palpitations.  He has had no syncope or presyncope.  He has had no chest pain.  He does have some mild shortness of breath  with exertion.  Otherwise, he has no further CHF symptoms to include PND  or orthopnea, no lower extremity swelling.  Tonight, while working, the  patient had a similar episode of just not feeling right and felt like  that he needed to come to the emergency department.  At current, he  feels at his baseline.   PAST MEDICAL HISTORY:  1. Coronary disease status post MI in the 1980s, status post balloon      angioplasty at that time.  2. Hypertension.  3. Hyperlipidemia.  4. Diabetes mellitus on no medical regimen.  5. Obesity.   MEDICINES.:  1. Toprol XL 50 mg p.o. daily.  2. Lipitor 10 mg p.o. daily.  3. Aspirin 81 mg p.o. daily.  4. Questran one pack p.o. daily.  5. Cardura 4 mg p.o. daily.   ALLERGIES:  PENICILLIN, SULFA.   SOCIAL HISTORY:  The patient is nonsmoker.   FAMILY HISTORY:  Noncontributory.   REVIEW OF SYSTEMS:  All systems reviewed in detail and are negative  except as noted in history of present illness.   PHYSICAL EXAMINATION:  VITAL SIGNS: Blood pressure 139/70, heart rate  76, respiratory rate 16, oxygen saturation 99%, temperature 97.7.  GENERAL:  Obese male, alert and x3, no apparent distress.  HEENT: Atraumatic, normocephalic, pupils equal, round, react to light,  extraocular is intact.  NECK:  Supple.  No adenopathy, no JVD, no carotid bruits.  CHEST:  Lungs clear to auscultation bilaterally with equal breath  sounds.  Coronary regular rhythm, normal rate, normal S1-S2, no murmurs,  rubs or gallops.  ABDOMEN:  Soft, nontender, nondistended.  EXTREMITIES:  No clubbing, cyanosis or edema.  NEUROLOGIC:  No focal deficits.   STUDIES:  EKG shows normal sinus rhythm with an old inferoposterior MI,  no ischemic changes from previous, poor quality tracing.  Chest x-ray  shows no acute abnormality.   LABORATORY DATA:  White blood cell count 10.9, hematocrit 52.7,  platelet  count 145.  Sodium 136, potassium 3.6, chloride 99, BUN 13, creatinine  1.1, glucose 172, D-dimer 0.22, CK-MB 1.6/2, troponin less than 0.05,  less than 0.05, myoglobin 99/126.   IMPRESSION AND PLAN:  A 65 year old male with known coronary artery  disease who presented to the emergency department with a couple week  history of facial flushing, episode of palpitations that sound similar  to premature ventricular contractions, along with elevated blood  pressure.  The patient has had no chest pain.  He does have mild  shortness of breath with exertion.   PLAN:  1. Cardiovascular:  Admit the patient to a telemetry bed, rule out      with serial cardiac enzymes, increase Toprol XL 200 mg p.o. daily,      aspirin daily, Lipitor daily, home dose of Cardura, daily EKGs,      check a BNP with next lab draw.  2. Endocrine.  Check hemoglobin A1c, sliding scale insulin, thyroid      function tests.  3.  Fluids, electrolytes and nutrition.  Diabetic diet, n.p.o. after      midnight, CMP and magnesium in the morning.  4. If all other workup is negative, consider pheochromocytoma workup.      Rod Holler, MD  Electronically Signed     TRK/MEDQ  D:  12/13/2007  T:  12/14/2007  Job:  [id number]

## 2011-01-11 NOTE — Assessment & Plan Note (Signed)
[facility]                         GASTROENTEROLOGY OFFICE NOTE   Cameron, Daniel                   MRN:          [id number]  DATE:01/04/2007                            DOB:          Jul 22, 1946    OFFICE CONSULTATION NOTE   Patient is self-referred.   REASON FOR CONSULTATION:  1. Dysphagia.  2. Gas.  3. Due for surveillance colonoscopy.   HISTORY:  This is a 65 year old white male with a history of coronary  artery disease, hypertension, hyperlipidemia, obesity, gastroesophageal  reflux disease, irritable bowel syndrome, prior cholecystectomy,  adenomatous colon polyps, and diverticulosis, who refers himself for  evaluation of the above-listed problems.  He was last evaluated in July  of 2007 when he was having problems with lower abdominal cramping,  urgency, and loose stools.  This was felt possibly to be due to  intolerance to the drug Zetia.  He tells me that after discontinuing the  drug, his symptoms improved.  He was advised that he was due for a  followup colonoscopy in the fall.  He did receive a recall letter.  For  his reflux disease, he has been using Nexium sporadically.  He tells me  now that his current problem is that of intermittent solid food  dysphagia, which he has experienced several times over the past several  weeks.  Also, he had a bad taste in his mouth and a coating on his  tongue, which Dr. Debby Bud treated him with some topical agent.  Other  complaints are more vague such as fatigue, bad taste in his mouth, and  gas.  He has had no vomiting, bleeding, or weight loss.   ALLERGIES:  1. SULFA.  2. PENICILLIN.  3. LISINOPRIL.   MEDICATIONS:  1. Toprol-XL 50 mg daily.  2. Lipitor 10 mg daily.  3. Aspirin 81 mg daily.  4. Cholestane one packet daily.  5. Xyzal once daily.  6. Nexium 40 mg p.r.n.   PHYSICAL EXAMINATION:  GENERAL:  A well-appearing male in no acute  distress.  VITAL SIGNS:  Blood pressure  is 150/88, heart rate is 90, weight is  241.8 pounds.  HEENT:  Sclerae are anicteric, conjunctivae are pink, oral mucosa is  intact with no evidence of thrush or other abnormality.  LUNGS:  Clear.  HEART:  Regular.  ABDOMEN:  Soft without tenderness, masses, or hernia.  Good bowel sounds  heard.  EXTREMITIES:  Without edema.   IMPRESSION:  1. History of reflux disease with intermittent dysphagia.  Rule out      peptic stricture.  2. Irritable bowel syndrome.  Diarrhea predominant.  Ongoing.  3. Increased intestinal gas.  4. History of adenomatous colon polyps due for surveillance.   RECOMMENDATIONS:  1. Nexium 40 mg daily.  A prescription with multiple refills has been      provided.  2. Probiotic Align one daily for two weeks.  Samples have been      provided.  3. Schedule a colonoscopy for polyp surveillance and upper endoscopy      with dilation for dysphagia.  The nature of both procedures as well  as the risks, benefits, and alternatives have been reviewed.  He      understood and agreed to proceed.     Wilhemina Bonito. Marina Goodell, MD  Electronically Signed    JNP/MedQ  DD: 01/04/2007  DT: 01/04/2007  Job #: [id number]   cc:   Rosalyn Gess. Norins, MD

## 2011-01-11 NOTE — Assessment & Plan Note (Signed)
[facility]                            CARDIOLOGY OFFICE NOTE   Cameron Daniel, Cameron Daniel                   MRN:          [id number]  DATE:01/01/2008                            DOB:          [birth year]    Cameron Daniel is in for followup.  We sat and reviewed his Cardiolite in detail.  He was able to exercise without chest pain or EKG changes.  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  Since  discharge from the hospital, he has actually been feeling quite well.  He denies any ongoing symptoms.  Cameron Daniel quit smoking in 1985.   MEDICATIONS:  1. Nexium 40 mg daily.  2. Toprol XL 50 mg daily.  3. Lipitor 10 mg daily.  4. Aspirin 81 mg daily.  5. Questran pack daily.   PHYSICAL EXAMINATION:  He is alert and oriented.  Blood pressure is  138/90, pulse is 68.  The lung fields are clear and the cardiac rhythm is regular.  No  significant murmurs are noted.   His hospital LDL was relatively well-controlled, being 54.9.   His chest x-ray in the hospital likewise is unremarkable.   IMPRESSION:  1. Coronary artery disease with prior percutaneous coronary      intervention 1985.  2. No significant change in myocardial perfusion imaging.  3. Hypercholesterolemia with an LDL at target.  4. Moderate obesity.  5. Hypertension, borderline control on current medical regimen.   RECOMMENDATIONS:  The recommendations remain the same and unchanged.  Treatment of blood pressure could be accomplished with minus weight  loss.     Arturo Morton. Riley Kill, MD, [facility]  Electronically Signed    TDS/MedQ  DD: 01/01/2008  DT: 01/01/2008  Job #: [id number]

## 2011-01-11 NOTE — Assessment & Plan Note (Signed)
[facility]                         GASTROENTEROLOGY OFFICE NOTE   AMERE, BRICCO                   MRN:          [id number]  DATE:07/10/2007                            DOB:          Oct 20, 1945    Cameron Daniel presents today for followup.  He was evaluated June 13, 2007, by  Mike Gip, PA-C as a work-in patient for abdominal bloating, cramps,  indigestion, gas, and loose stools of 2 month's duration.  Multiple  stool studies were obtained and returned normal or negative.  He was  treated with a 2-week course of Flagyl and Robinul.  Approximately 8  days into his treatment, he reports resolution of symptoms, still with  some gas.  Robinul is helpful.  Stools are more formed and back to  normal.  He has several other GI complaints including positional  numbness of his hands for which I have referred him to Dr. Debby Bud.   CURRENT MEDICATIONS:  Nexium, metoprolol XL, Lipitor, aspirin, Questran,  multivitamin, and Robinul.   PHYSICAL EXAM:  Well-appearing male in no acute distress.  His blood pressure is 142/98, heart rate 88, weight 252.6 pounds.  ABDOMEN:  Soft without tenderness, mass, or hernia, normal bowel sounds  heard.   IMPRESSION:  1. Recent problems with loose stools, likely infectious      gastroenteritis exacerbating IBS.  Improved after a course of      Flagyl therapy.  This is either cause and effect or temporal      relationship.  In any event, he is back to his baseline.  2. Irritable bowel syndrome.  Ongoing.  3. History of reflux disease.   RECOMMENDATIONS:  1. Continue Robinul p.r.n.  2. Continue Nexium for reflux.  3. Resume general medical care with Dr. Debby Bud.     Wilhemina Bonito. Marina Goodell, MD  Electronically Signed    JNP/MedQ  DD: 07/10/2007  DT: 07/10/2007  Job #: [id number]   cc:   Rosalyn Gess. Norins, MD

## 2011-01-11 NOTE — Assessment & Plan Note (Signed)
[facility]                         GASTROENTEROLOGY OFFICE NOTE   Cameron Daniel, Cameron Daniel                   MRN:          [id number]  DATE:06/13/2007                            DOB:          July 27, 1946    OFFICE ADD-ON NOTE:   CHIEF COMPLAINT:  Abdominal bloating, cramping, indigestion, gas, and  loose stools x2 months.   HISTORY:  Cameron Daniel is a pleasant 65 year old white male known to Dr. Yancey Flemings who does have a history of GERD and IBS and adenomatous polyps.  He has had problems with diarrhea and gas in the past attributed to his  IBS.  He last underwent a colonoscopy in July 2008, per Dr. Marina Goodell.  He  did have 3 small colon polyps, universal diverticulosis, and internal  and externa hemorrhoids.  He also had endoscopy at that time with an  esophageal stricture which was dilated.  He says that he has had trouble  over the past 6-8 weeks with increase in intestinal gas, bloating, and  indigestion.  He says he has also generally felt fatigued and says that  he feels that this is wearing him out.  He has not had any nausea or  vomiting.  His appetite has been okay, occasional nausea, no fever or  chills, and says usually he is having from 2-4 bowel movements per day,  usually starts out with a formed stool often followed by more urgent  diarrheal stools.  He has not noted any melena or hematochezia.  He has  been on Align now over the past 6 weeks and has not noticed any change  in his symptoms.  He does mention that Dr. Debby Bud had given him some  Bentyl which caused him side effects with sleepiness, lethargy, etc.,  and he stopped taking it.  He is also concerned because he says his wife  went on a trip to [place] and got sick after she returned.  He was not  aware that she was having diarrhea problems and wonders if he may have  caught something from her.  She apparently was given a course of Flagyl  and is feeling better.  The patient does work  outdoors.  He has well  water at home but drinks bottled water.  He did take a course of Biaxin  a couple of months ago for an upper respiratory infection.   CURRENT MEDICATIONS:  1. Nexium 40 p.o. daily.  2. Toprol XL 50 daily.  3. Lipitor 10 daily.  4. Aspirin 81 mg daily.  5. Questran 1 pack daily.   ALLERGIES:  1. SULFA.  2. PENICILLIN.  3. LISINOPRIL.   PHYSICAL EXAMINATION:  GENERAL:  A well developed white male in no acute  distress, very pleasant.  VITAL SIGNS:  Weight is 248, blood pressure 132/90, pulse 78.  HEENT:  Nontraumatic.  Normocephalic.  EOMI.  PERRLA.  Sclerae  anicteric.  CARDIOVASCULAR:  Regular rate and rhythm with S1 and S2.  No murmur,  rub, or gallop.  PULMONARY:  Clear to A&P.  ABDOMEN:  Soft, not visibly distended.  Bowel sounds are active to  hyperactive.  He  has no focal tenderness.  No mass or  hepatosplenomegaly.  RECTAL:  Not done today.   IMPRESSION:  A 65 year old male with intestinal gas, bloating, urgency,  and loose stools.  Unclear whether this is all an exacerbation of his  irritable bowel syndrome, perhaps underlying bacterial overgrowth, or  infectious enteritis.   PLAN:  1. Check stool for C&S, O&P, C. diff, and stool for WBC.  2. Trial of Robinul forte 2 mg q.a.m. which should have less      anticholinergic side effects.  3. Flagyl 250 p.o. q.i.d. x14 days.  4. He will follow up with Dr. Marina Goodell.      Mike Gip, PA-C  Electronically Signed      Wilhemina Bonito. Marina Goodell, MD  Electronically Signed   AE/MedQ  DD: 06/13/2007  DT: 06/14/2007  Job #: [id number]

## 2011-01-11 NOTE — Discharge Summary (Signed)
NAME:  Cameron Daniel, Cameron Daniel NO.:  [id number]   MEDICAL RECORD NO.:  [id number]          PATIENT TYPE:  INP   LOCATION:  2004                         FACILITY:  [facility]   PHYSICIAN:  Bevelyn Buckles. Bensimhon, MDDATE OF BIRTH:  [birth year]   DATE OF ADMISSION:  12/13/2007  DATE OF DISCHARGE:                         DISCHARGE SUMMARY - REFERRING   DISCHARGE DIAGNOSES:  1. Malaise.  2. History of coronary artery disease.  3. Flushing, currently being evaluated by Dr. Debby Bud was 24-hour      urine.  4. History as noted below.   BRIEF HISTORY:  Mr. Stecklein is a 65 year old male who presents to the  emergency room with multiple complaints.  He states that over the past  several weeks, he generally has not felt well.  He describes flushing  associated with elevated blood pressure and PVC-like symptoms of  palpitations.  He denies any chest discomfort, but he has noted some  mild dyspnea on exertion, but specifically denies orthopnea, PND or  lower extremity swelling.  While working on the evening of presentation,  he stated he just did not feel right, thus he came to the emergency room  for further evaluation.   PAST MEDICAL HISTORY:  His history is notable for:  1. Myocardial infarction in the 1980s with balloon angioplasty at that      time.  2. Hypertension.  3. Hyperlipidemia.  4. Diabetes without medical regimen.  5. Obesity.   ALLERGIES:  To PENICILLIN and SULFA.   LABORATORY AND ACCESSORY CLINICAL DATA:  Chest x-ray on the 16th  revealed no acute disease.   Admission weight was 112.7 kg.  Admission H&H were 17.8 and 52.7, normal  indices, platelets 145,000, WBC 10.9.  D-dimer 0.22.  Sodium 137,  potassium 3.9, BUN 11, creatinine 0.97, glucose 130, normal LFTs at 3  and 3.8 and 6.5 and 4.6.  Troponins were within normal limits x2.  BNP  was less than 30.  Urinalysis did not show any abnormalities.   EKG showed normal sinus rhythm, left axis deviation, early R  wave,  nonspecific ST-T wave changes.   HOSPITAL COURSE:  Dr. Samuel Bouche admitted the patient overnight for  observation.  By the morning of the 17th, he stated that he felt much  better and he did not have any specific complaints.  MBs were noted to  be slightly elevated so Dr. Gala Romney ordered a second set.  As these  were declining and his troponins were negative, Dr. Gala Romney felt that  the patient could be discharged home with outpatient followup with Dr.  Riley Kill.   DISPOSITION:  The patient is discharged home on December 14, 2007.   ACTIVITY:  Not restricted.   DIET:  He is asked to maintain a low-sodium heart-healthy ADA diet.   WOUND CARE:  Not applicable.   DISCHARGE MEDICATIONS:  He was asked to continue on his home medications  which include:  1. Aspirin 81 mg daily.  2. Lipitor 10 mg nightly.  3. Toprol-XL 50 mg daily.  4. Nexium 40 mg daily.  5. Questran daily.  6. Cardura 40 mg daily.  7. He received  a new prescription for nitroglycerin as needed.   FOLLOWUP:  He will have an outpatient stress Myoview; the office will  call him with the date and time.  He will follow up with Dr. Riley Kill on  Jan 01, 2008 a 4 p.m. and continue to follow up with Dr. Debby Bud as  scheduled.  He was asked to bring all medications to all appointments.   DISCHARGE TIME:  Forty minutes.      Joellyn Rued, PA-C      Bevelyn Buckles. Bensimhon, MD  Electronically Signed    EW/MEDQ  D:  12/14/2007  T:  12/14/2007  Job:  [id number]   cc:   Rosalyn Gess. Norins, MD  Arturo Morton. Riley Kill, MD, [facility]

## 2011-01-13 ENCOUNTER — Telehealth: Payer: Self-pay | Admitting: Internal Medicine

## 2011-01-13 NOTE — Telephone Encounter (Signed)
Pt aware of Dr. Perry's recommendations. 

## 2011-01-13 NOTE — Telephone Encounter (Signed)
Pt wants to be seen for increased gas and pain. He was scheduled for an appt 01/10/11 but states the [place] hosp called with an appt the same day so he couldn't keep that appt. He was not pleased with his visit that day, he was told to take Beano. Requesting an appt, offered appt with [facility]level but wants to see Cameron Daniel. Appt made for pt to see Cameron Daniel 01/28/11@4pm . Pt states that a couple of weeks ago Cameron Daniel had prescribed Flagyl for him and that the symptoms got better while he was on the Flagyl, but when he was finished the symptoms came back. Pt wants to know if there is anything he can do/try until his visit. Please advise.

## 2011-01-13 NOTE — Telephone Encounter (Signed)
Left message for pt to call back  °

## 2011-01-13 NOTE — Telephone Encounter (Signed)
Sorry he no showed for his appt this week. He can try Align one daily for 2 weeks

## 2011-01-14 NOTE — Assessment & Plan Note (Signed)
[facility]                             PULMONARY OFFICE NOTE   [name], [name]                   MRN:          [id number]  DATE:10/02/2006                            DOB:          [birth year]    PROBLEM:  1. Allergic conjunctivitis.  2. Allergic rhinitis, perennial and seasonal.   HISTORY:  Building up vaccine based on skin testing in December.  Had a  little local reaction once, but no systemic problems.  Notices blood  pressure is running higher.  Has had GI upset, followed by Dr. Marina Goodell for  irritable bowel, and just restarted Nexium, but no specific food  intolerance.   MEDICATIONS:  1. Enteric aspirin 81 mg.  2. Cholestyramine.  3. Testosterone.  4. Nexium 40 mg.  5. Xyzal 5 mg.  6. Lipitor 10 mg.  7. Veramyst.   DRUG INTOLERANCE:  SULFA, PENICILLIN, AND LISINOPRIL.   OBJECTIVE:  Weight 253 pounds, BP 156/104, pulse regular 75, room air  saturation 97%.  Oropharynx is a little reddened without evidence of yeast.  There is no  adenopathy, no visible postnasal drainage.  LUNGS:  Clear.  HEART:  Sounds regular without murmur.   IMPRESSION:  1. Stomatitis, likely from reflux.  2. Allergic rhinitis and allergic conjunctivitis, otherwise doing      well.  3. He needs to see a primary physician about his blood pressure.   PLAN:  1. He will continue allergy vaccine, and our lab is monitoring his      response.  2. Cameron Daniel.  3. Continue Nexium.  4. Follow up with Dr. Marina Goodell for gastrointestinal, and with his other      doctors as before.  5. Schedule return 2 months, earlier p.r.n.     Clinton D. Maple Hudson, MD, Tonny Bollman, FACP  Electronically Signed    CDY/MedQ  DD: 10/05/2006  DT: 10/05/2006  Job #: [id number]

## 2011-01-14 NOTE — Assessment & Plan Note (Signed)
[facility]                               PULMONARY OFFICE NOTE   Cameron Daniel, Cameron Daniel                   MRN:          [id number]  DATE:07/18/2006                            DOB:          July 06, 1946    HISTORY OF PRESENT ILLNESS:  This is a 65 year old white male patient of Dr.  Posey Rea, who has a known history of hypertension, hyperlipidemia, and  allergic rhinitis.  The patient presents for an acute office visit  complaining of a 2 month history of persistent allergy problems.  He  complains of watery itchy eyes, sneezing, nasal congestion, post-nasal drip.  He has been using Allergra, Zyrtec, Patanol, and Nasacort without much  relief.  The patient does report that he did take a couple samples of Xyzal  that seemed to help somewhat.  The patient denies any fever, chest pain,  shortness of breath, wheezing.  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  The patient also has previously been  on allergy vaccines several years ago by Dr. Corinda Gubler.   PAST MEDICAL HISTORY:  Reviewed.   CURRENT MEDICATIONS:  Reviewed.   PHYSICAL EXAM:  The patient is a pleasant male in no acute distress.  He is afebrile with stable vital signs.  O2 saturation is 95% on room air.  HEENT:  Conjunctivae and nasal mucosa are pale.  Posterior oropharynx is  clear without any exudate.  TMs  are normal.  NECK:  Supple without adenopathy.  LUNGS:  Sounds are clear.  CARDIAC:  Regular rate.  ABDOMEN:  Morbidly obese and soft.  EXTREMITIES:  Warm without any calf cyanosis, clubbing, or edema.   IMPRESSION AND PLAN:  Allergic rhinitis.  The patient is to begin Xyzal 5 mg  nightly.  Change from Nasacort over to Veramyst 2 puffs twice daily.  May  use saline nasal spray as needed.  The patient is advised on environmental  triggers and occupational preventative  measures. He  has been recommended to follow up with Dr. Maple Hudson, our allergist here at  [facility] in 2 weeks, or sooner if needed.     Rubye Oaks, NP  Electronically Signed      Lonzo Cloud. Kriste Basque, MD  Electronically Signed   TP/MedQ  DD: 07/18/2006  DT: 07/18/2006  Job #: [id number]

## 2011-01-14 NOTE — Assessment & Plan Note (Signed)
[facility]                           PRIMARY CARE OFFICE NOTE   ARSENIY, TOOMEY                   MRN:          [id number]  DATE:10/02/2006                            DOB:          09-30-1945    [name] is well known to me.  He has been followed for intermittent  hypertension and tried on multiple medications in the past for which he  was poorly tolerant including hydrochlorothiazide and lisinopril.   The patient was seen in the allergy department by Dr. Maple Hudson today and  was found to have significantly elevated blood pressure in the 150s over  102.  He was sent immediately downstairs for evaluation.   The patient does give a history of having elevated blood pressures at  home with systolics running in the 140s to 150s and diastolics in the  low 90s.  He has had no headaches, no nose bleeds, no visual changes or  no other symptoms.   The patient reports he has had a flare of dyspepsia with abdominal  bloating and gas.  He has resumed taking Levbid and Nexium.   Musculoskeletal:  The patient did see Dr. Cleophas Dunker recently.  He did  have a cortisone injection in his right shoulder with some improvement  in his discomfort.   CURRENT MEDICATIONS:  1. Testosterone injections.  2. Levbid as needed.  3. Aspirin daily.  4. Lipitor 10 mg daily.  5. Xyzal daily.  6. Questran as needed.   REVIEW OF SYSTEMS:  Negative as noted.   EXAMINATION:  VITAL SIGNS:  Temperature was 97.1 with blood pressure  157/98, on my repeat 156/102.  Pulse was 89 and weight 253.  GENERAL APPEARANCE:  This is a heavyset Caucasian male in no acute  distress.   No further exam conduced.   ASSESSMENT AND PLAN:  1. Hypertension.  The patient with persistently elevated blood      pressure, now running with diastolics in the 100 range.  Plan:  The      patient is given a prescription for Caduet 5/10 to start at this      time.  This will include his Lipitor as  well as amlodipine for      blood pressure      control.  He will need to have followup blood pressure check at his      convenience.  2. Gastrointestinal.  The patient with a flare of dyspepsia and      irritable bowel syndrome.  He is to continue with Levbid and      Nexium.     Cameron Gess Norins, MD  Electronically Signed    MEN/MedQ  DD: 10/02/2006  DT: 10/02/2006  Job #: [id number]

## 2011-01-14 NOTE — Assessment & Plan Note (Signed)
[facility]                             PULMONARY OFFICE NOTE   TRIPTON, NED                   MRN:          [id number]  DATE:08/02/2006                            DOB:          Jan 01, 1946    PROBLEMS:  1. Allergic conjunctivitis.  2. Allergic rhinitis, perennial and seasonal.   HISTORY:  One-year followup.  I had last seen him in June of 2006, when  he was choosing to transfer his allergy care to my service.  He grows  Teacher, adult education, and we had discussed the environmental precautions  available.  He comes now wishing allergy testing.  They have been  remodeling a house, changing filters, but there has been considerable  dust.  He saw our nurse practitioner in November and has tried [place] and  [employer].  These do help some.  He has also used Astelin nasal spray.  Previously he had been on allergy vaccine from Dr. Stevphen Rochester, and  feels this helped.  Specific triggers that he has recognized include  ragweed, house dust and especially exposure to wax myrtle trees.   MEDICATIONS:  1. Enteric aspirin 81 mg.  2. Cholestyramine.  3. Astelin nasal spray.  4. Testosterone injections.  5. Xizal 5 mg used p.r.n.  6. Nasacort.  7. Lipitor.  8. Recently he has substituted Veramyst for Nasacort.   ALLERGIES:  DRUG INTOLERANCE TO SULFA, PENICILLIN and LISINOPRIL.   OBJECTIVE:  Weight 251 pounds, BP 130/88, pulse regular 85, room air  saturation 95%.  Conjunctivae are injected.  There is mild end expiratory wheezing, unlabored, with fair air flow, no  cough.  No visible postnasal drainage or pharyngeal erythema, no neck vein  distention.  Heart sounds are regular without murmur.   SKIN TESTS:  Positive histamine, negative __________ controls, positive  intradermal reactions primarily to grass, weed and tree pollens, house  dust and dust mite, as would be expected from his history.   IMPRESSION:  1. Allergic rhinitis.  2.  Asthma.  3. Allergic conjunctivitis.   PLAN:  1. We refilled Veramyst, Pataday, with alternative to try Optivar.  2. We discussed risk and benefit considerations of allergy vaccine.      He is interested in having his wife give his shots, and I have      discussed issues related to administration outside of a medical      office.  He will begin here, and we will discuss associated issues      as he returns for followup.  He may continue Xizal p.r.n.  3. Schedule return in 2 months, earlier p.r.n.     Clinton D. Maple Hudson, MD, [facility] Bollman, FACP  Electronically Signed    CDY/MedQ  DD: 08/05/2006  DT: 08/06/2006  Job #: [id number]

## 2011-01-14 NOTE — Assessment & Plan Note (Signed)
[facility]                            CARDIOLOGY OFFICE NOTE   [name], [name]                   MRN:          [id number]  DATE:08/15/2006                            DOB:          26-Jun-1946    [name] is in for followup.  He is stable, he has not been having  any ongoing chest pain.  He has had trouble with a number of lipid-  lowering medicines, and has seen Dr. Debby Bud and now is back on Lipitor.  With this his LDL cholesterol is 65 and his SGOT is normal, with an SGPT  of 41.  His hemoglobin A1c is 6.5.  Over the last several months, he has  gained about 10 pounds.   PHYSICAL EXAMINATION:  The weight is 249 pounds, the blood pressure is  136/90, the pulse is 85.  LUNGS:  The lung fields are clear.  CARDIAC:  The cardiac rhythm is regular, there is no murmur appreciated.   EKG is compatible with inferoposterior wall myocardial infarction.   IMPRESSION:  1. Coronary disease, status post acute myocardial infarction 1985,      with repeat balloon dilatation in August 1986.  2. Hypercholesterolemia, on lipid-lowering therapy.  3. Obesity with increased weight gain.  4. Hypertension.   RECOMMENDATIONS:  We have reviewed the indications and reasons for  significant weight reduction.  I would recommend that he remain on his  lipid-lowering therapy as long as he tolerates this.  He is to return to  see [place] in followup in about a year.  He will continue with his lipid  followup with Dr. Debby Bud.     Cameron Daniel. Riley Kill, MD, [facility]  Electronically Signed    TDS/MedQ  DD: 08/15/2006  DT: 08/16/2006  Job #: [id number]

## 2011-01-14 NOTE — Assessment & Plan Note (Signed)
[facility]                             PRIMARY CARE OFFICE NOTE   Cameron Daniel, Cameron Daniel                   MRN:          [id number]  DATE:03/13/2006                            DOB:          07/22/1946    PRIMARY CARE OFFICE NOTE:  [name] is well known to the practice.  He  presents as a walk-in today complaining of allergy problems.  He reports  that he was recently at a house that had been closed-up for quite some time,  and immediately thereafter he had a significant increase in his allergy  symptoms. Patient is complaining of pressure in his maxillary and sphenoid  sinus regions.  He has had no fevers or chills.  He has continued with  Astelin nasal spray, but on an infrequent basis.  He recently restarted his  Singular.   Patient developed erythema and discomfort at the lateral aspect of his left  eye, the bulbar conjunctiva.  He had been using Acular, which did not  relieve his discomfort.  It has now stopped hurting but he still has an area  of painless erythema.   MEDICATIONS:  1.  No axillary medications.  2.  Aspirin 81 mg daily.  3.  Cholestyramine q.a.m.  4.  Astelin nasal spray p.r.n.  5.  Testosterone injections monthly.  6.  Nexium 40 mg daily.  7.  Levitra p.r.n.  8.  Recently had the addition of Levbid for irritable bowel syndrome.   The patient is followed in a lipid clinic where the staff is making efforts  to find a lipid lowering regimen he will tolerate.   REVIEW OF SYSTEMS:  Per the HPI.   EXAMINATION:  VITAL SIGNS:  Temperature 97.8.  Blood pressure 140/91.  Pulse  79.  GENERAL:  He is a heavy-set Caucasian gentleman in no acute distress.  HEENT:  Patient has dark circles and bags under his eyes.  He had tenderness  to percussion over his maxillary sinus.  No further exam conducted.   IMPRESSION AND PLAN:  1.  Allergic rhinitis.  Patient with a flare of allergies.  He will continue      with his Singular.   He will continue Astelin nasal spray daily.  Will      add nasal inhalation steroid spray. A sample of Rhinocort is provided      for him to use one spray in each nostril daily.  He may use over-the-      counter Claritin as needed for rhinorrhea or itching eyes.  Patient is      given Depo-Medrol 80 mg intramuscular today for acute allergy symptoms.  2.  Ophthalmologic.  Patient with raised, erythematous area the bulbar      conjunctiva not      involving the lens.  Patient is advised if he has recurrent pain or      discomfort or persistent erythema he will need to be seen and evaluated      by an Ophthalmology.  Rosalyn Gess Norins, MD   MEN/MedQ  DD:  03/13/2006  DT:  03/13/2006  Job #:  [id number]

## 2011-01-28 ENCOUNTER — Ambulatory Visit (INDEPENDENT_AMBULATORY_CARE_PROVIDER_SITE_OTHER): Payer: Self-pay | Admitting: Internal Medicine

## 2011-01-28 ENCOUNTER — Encounter: Payer: Self-pay | Admitting: Internal Medicine

## 2011-01-28 VITALS — BP 164/82 | HR 80 | Ht 71.0 in | Wt 241.8 lb

## 2011-01-28 DIAGNOSIS — R141 Gas pain: Secondary | ICD-10-CM

## 2011-01-28 DIAGNOSIS — R143 Flatulence: Secondary | ICD-10-CM

## 2011-01-28 DIAGNOSIS — K589 Irritable bowel syndrome without diarrhea: Secondary | ICD-10-CM

## 2011-01-28 DIAGNOSIS — Z86010 Personal history of colonic polyps: Secondary | ICD-10-CM

## 2011-01-28 DIAGNOSIS — K219 Gastro-esophageal reflux disease without esophagitis: Secondary | ICD-10-CM

## 2011-01-28 NOTE — Progress Notes (Signed)
HISTORY OF PRESENT ILLNESS:  Cameron Daniel is a 65 y.o. male with coronary artery disease, hypertension, hyperlipidemia, obesity, GERD, adenomatous colon polyps, diverticulosis. Also a history of irritable bowel syndrome with alternating bowel habits and bloating. He presents today with a chief complaint of abdominal discomfort and bloating. He was last evaluated October 2011. See that dictation. Contacted the office recently complaining of bloating. He requested metronidazole which he took for 10 days. This did not help. Subsequently seen at the [facility] hospital by a GI doctor. Apparently no significant recommendations. He contacted the esophagus was placed on probiotic Align which is taken for 10 days. He states that this started to help. Reports that he has been asymptomatic the past 3 days. No nausea, vomiting, or weight loss. No bleeding. His last colonoscopy was in July of 2008. He is due for followup next year. He continues on pantoprazole for GERD. He tells me that he is taking 160 mg daily. He should be on 40 mg daily. No GERD symptoms. No dysphagia.  REVIEW OF SYSTEMS:  All non-GI ROS reported to be negative.  Past Medical History  Diagnosis Date  . Coronary atherosclerosis of unspecified type of vessel, native or graft   . Unspecified essential hypertension   . Other and unspecified hyperlipidemia   . Obesity, unspecified   . Esophageal reflux   . Nocturia   . Flushing   . Other malaise and fatigue   . Type II or unspecified type diabetes mellitus without mention of complication, not stated as uncontrolled   . Allergic rhinitis, cause unspecified   . Other acquired absence of organ   . Psychosexual dysfunction with inhibited sexual excitement   . Irritable bowel syndrome   . Diverticulosis of colon (without mention of hemorrhage)   . Personal history of colonic polyps     Past Surgical History  Procedure Date  . Elbow bursa surgery     Social History Cameron Daniel   reports that he quit smoking about 27 years ago. He has quit using smokeless tobacco. He reports that he does not drink alcohol or use illicit drugs.  family history includes Coronary artery disease in his mother and Lung cancer in his father.  There is no history of Colon cancer.  Allergies  Allergen Reactions  . Doxazosin Mesylate   . Penicillins   . Sulfonamide Derivatives        PHYSICAL EXAMINATION:  Vital signs: BP 164/82  Pulse 80  Ht 5\' 11"  (1.803 m)  Wt 241 lb 12.8 oz (109.68 kg)  BMI 33.72 kg/m2 General: Well-developed, obese, well-nourished, no acute distress HEENT: Sclerae are anicteric, conjunctiva pink. Oral mucosa intact Lungs: Clear Heart: Regular Abdomen: soft, obese, nontender, nondistended, no obvious ascites, no peritoneal signs, normal bowel sounds. No organomegaly. Extremities: No edema Psychiatric: alert and oriented x3. Cooperative   ASSESSMENT:  #1. Irritable bowel syndrome. Improved with probiotic #2. Gas and bloating. Improved with probiotic #3. GERD. Controlled on PPI #4. History of adenomatous polyps. Last colonoscopy July 2008   PLAN:  #1. Molli Knock to use Align on demand. One month of samples provided #2. Decrease pantoprazole to 40 mg daily. To be taken 30 minutes before her first meal #3. Reflux precautions with attention to weight loss #4. Surveillance colonoscopy around July of 2013

## 2011-01-28 NOTE — Patient Instructions (Signed)
Align samples given for you to take 1 daily.

## 2011-03-03 ENCOUNTER — Emergency Department (HOSPITAL_COMMUNITY)
Admission: EM | Admit: 2011-03-03 | Discharge: 2011-03-03 | Disposition: A | Discharge status: Home / Self Care | Payer: Non-veteran care | Attending: Emergency Medicine | Admitting: Emergency Medicine

## 2011-03-03 ENCOUNTER — Emergency Department (HOSPITAL_COMMUNITY): Payer: Non-veteran care

## 2011-03-03 DIAGNOSIS — I4510 Unspecified right bundle-branch block: Secondary | ICD-10-CM | POA: Insufficient documentation

## 2011-03-03 DIAGNOSIS — I10 Essential (primary) hypertension: Secondary | ICD-10-CM | POA: Insufficient documentation

## 2011-03-03 DIAGNOSIS — E7800 Pure hypercholesterolemia, unspecified: Secondary | ICD-10-CM | POA: Insufficient documentation

## 2011-03-03 DIAGNOSIS — I2510 Atherosclerotic heart disease of native coronary artery without angina pectoris: Secondary | ICD-10-CM | POA: Insufficient documentation

## 2011-03-03 DIAGNOSIS — I252 Old myocardial infarction: Secondary | ICD-10-CM | POA: Insufficient documentation

## 2011-03-03 DIAGNOSIS — K117 Disturbances of salivary secretion: Secondary | ICD-10-CM | POA: Insufficient documentation

## 2011-03-03 DIAGNOSIS — E119 Type 2 diabetes mellitus without complications: Secondary | ICD-10-CM | POA: Insufficient documentation

## 2011-03-03 DIAGNOSIS — T43205A Adverse effect of unspecified antidepressants, initial encounter: Secondary | ICD-10-CM | POA: Insufficient documentation

## 2011-03-03 DIAGNOSIS — R197 Diarrhea, unspecified: Secondary | ICD-10-CM | POA: Insufficient documentation

## 2011-03-03 LAB — COMPREHENSIVE METABOLIC PANEL
ALT: 24 U/L (ref 0–53)
Alkaline Phosphatase: 85 U/L (ref 39–117)
BUN: 9 mg/dL (ref 6–23)
CO2: 26 mEq/L (ref 19–32)
Calcium: 10.5 mg/dL (ref 8.4–10.5)
GFR calc Af Amer: >60 mL/min (ref >60)
GFR calc non Af Amer: >60 mL/min (ref >60)
Glucose, Bld: 131 mg/dL — ABNORMAL HIGH (ref 70–99)
Potassium: 3.7 mEq/L (ref 3.5–5.1)
Sodium: 135 mEq/L (ref 135–145)
Total Protein: 7.4 g/dL (ref 6.0–8.3)

## 2011-03-03 LAB — CK TOTAL AND CKMB (NOT AT ARMC)
CK, MB: 3.2 ng/mL (ref 0.3–4.0)
Total CK: 124 U/L (ref 7–232)

## 2011-03-03 LAB — DIFFERENTIAL
Basophils Absolute: 0 10*3/uL (ref 0.0–0.1)
Basophils Relative: 0 % (ref 0–1)
Eosinophils Relative: 1 % (ref 0–5)
Monocytes Absolute: 0.6 10*3/uL (ref 0.1–1.0)
Monocytes Relative: 6 % (ref 3–12)
Neutrophils Relative %: 71 % (ref 43–77)

## 2011-03-03 LAB — URINALYSIS, ROUTINE W REFLEX MICROSCOPIC
Glucose, UA: NEGATIVE mg/dL
Hgb urine dipstick: NEGATIVE
Ketones, ur: NEGATIVE mg/dL
Leukocytes, UA: NEGATIVE
Protein, ur: NEGATIVE mg/dL
Specific Gravity, Urine: 1.018 (ref 1.005–1.030)
pH: 5 (ref 5.0–8.0)

## 2011-03-03 LAB — CBC
MCH: 30.5 pg (ref 26.0–34.0)
MCHC: 35.3 g/dL (ref 30.0–36.0)
RDW: 14.3 % (ref 11.5–15.5)

## 2011-03-03 LAB — LIPASE, BLOOD: Lipase: 26 U/L (ref 11–59)

## 2011-03-04 LAB — URINE CULTURE: Culture  Setup Time: 201207052153

## 2011-05-24 LAB — CBC
HCT: 52.7 — ABNORMAL HIGH
Hemoglobin: 17.8 — ABNORMAL HIGH
MCV: 86.7
RBC: 6.08 — ABNORMAL HIGH
WBC: 10.9 — ABNORMAL HIGH

## 2011-05-24 LAB — DIFFERENTIAL
Eosinophils Absolute: 0.1
Eosinophils Relative: 1
Lymphocytes Relative: 21
Lymphs Abs: 2.3
Monocytes Absolute: 0.6
Monocytes Relative: 6

## 2011-05-24 LAB — POCT I-STAT, CHEM 8
BUN: 13
Calcium, Ion: 1.23
Chloride: 99
Creatinine, Ser: 1.1
Glucose, Bld: 172 — ABNORMAL HIGH
TCO2: 27

## 2011-05-24 LAB — URINALYSIS, ROUTINE W REFLEX MICROSCOPIC
Bilirubin Urine: NEGATIVE
Nitrite: NEGATIVE
Specific Gravity, Urine: 1.015
Urobilinogen, UA: 0.2

## 2011-05-24 LAB — TROPONIN I: Troponin I: 0.02

## 2011-05-24 LAB — COMPREHENSIVE METABOLIC PANEL
ALT: 40
AST: 28
CO2: 26
Chloride: 102
GFR calc Af Amer: >60
GFR calc non Af Amer: >60
Glucose, Bld: 130 — ABNORMAL HIGH
Sodium: 137
Total Bilirubin: 0.9

## 2011-05-24 LAB — POCT CARDIAC MARKERS
CKMB, poc: 1.6
Myoglobin, poc: 126
Troponin i, poc: <0.05

## 2011-05-24 LAB — CK TOTAL AND CKMB (NOT AT ARMC)
CK, MB: 7.3 — ABNORMAL HIGH
Relative Index: 3.8 — ABNORMAL HIGH

## 2011-05-24 LAB — D-DIMER, QUANTITATIVE: D-Dimer, Quant: 0.22

## 2011-05-24 LAB — HEMOGLOBIN A1C
Hgb A1c MFr Bld: 7.1 — ABNORMAL HIGH
Mean Plasma Glucose: 175

## 2011-05-24 LAB — B-NATRIURETIC PEPTIDE (CONVERTED LAB): Pro B Natriuretic peptide (BNP): <30

## 2011-06-09 LAB — URINALYSIS, ROUTINE W REFLEX MICROSCOPIC
Hgb urine dipstick: NEGATIVE
Nitrite: NEGATIVE
Protein, ur: NEGATIVE
Urobilinogen, UA: 0.2

## 2011-06-09 LAB — COMPREHENSIVE METABOLIC PANEL
AST: 27
Albumin: 3.6
Alkaline Phosphatase: 89
BUN: 8
CO2: 27
Chloride: 99
Creatinine, Ser: 1.17
GFR calc Af Amer: >60
GFR calc non Af Amer: >60
Potassium: 3.2 — ABNORMAL LOW
Total Bilirubin: 0.5

## 2011-06-09 LAB — I-STAT 8, (EC8 V) (CONVERTED LAB)
Acid-Base Excess: 2
BUN: 10
BUN: 9
Bicarbonate: 26.9 — ABNORMAL HIGH
Chloride: 101
HCT: 47
HCT: 47
Hemoglobin: 16
Hemoglobin: 16
Operator id: 270651
Operator id: 270651
Potassium: 3.4 — ABNORMAL LOW
Sodium: 138
Sodium: 138
TCO2: 28
pCO2, Ven: 42.8 — ABNORMAL LOW

## 2011-06-09 LAB — DIFFERENTIAL
Basophils Absolute: 0.1
Eosinophils Relative: 1
Lymphocytes Relative: 30
Lymphs Abs: 2.7
Neutro Abs: 5.3
Neutrophils Relative %: 61

## 2011-06-09 LAB — CBC
HCT: 45.2
Platelets: 209
RDW: 13.6
WBC: 8.8

## 2011-06-09 LAB — POCT I-STAT CREATININE: Creatinine, Ser: 1.1

## 2011-06-13 LAB — I-STAT 8, (EC8 V) (CONVERTED LAB)
Acid-Base Excess: 3 — ABNORMAL HIGH
Chloride: 101
HCT: 44
Hemoglobin: 15
Operator id: 270111
Potassium: 3.9
Sodium: 137
pCO2, Ven: 53.5 — ABNORMAL HIGH

## 2011-06-13 LAB — POCT I-STAT CREATININE: Creatinine, Ser: 1

## 2011-06-13 LAB — CK TOTAL AND CKMB (NOT AT ARMC)
CK, MB: 6 — ABNORMAL HIGH
Total CK: 269 — ABNORMAL HIGH

## 2011-06-13 LAB — DIFFERENTIAL
Basophils Absolute: 0
Basophils Relative: 1
Eosinophils Absolute: 0.1
Eosinophils Relative: 1
Monocytes Absolute: 0.5
Monocytes Relative: 7

## 2011-06-13 LAB — POCT CARDIAC MARKERS: CKMB, poc: 2.7

## 2011-06-13 LAB — CBC
MCHC: 34.3
MCV: 88.4
Platelets: 213
RDW: 14.1 — ABNORMAL HIGH

## 2011-08-30 HISTORY — PX: COLON SURGERY: SHX602

## 2012-01-13 ENCOUNTER — Telehealth: Payer: Self-pay | Admitting: Internal Medicine

## 2012-01-13 NOTE — Telephone Encounter (Signed)
[facility]. Pt would like to see Dr. Marina Goodell and get his opinion. Offered appt with Dr. Marina Goodell for June 21st. Pt states he will keep his appt at the [facility] but would like for [place] to call him if we have any cancellations.

## 2012-01-20 ENCOUNTER — Ambulatory Visit (INDEPENDENT_AMBULATORY_CARE_PROVIDER_SITE_OTHER): Payer: Medicare Other | Admitting: General Surgery

## 2012-01-20 ENCOUNTER — Encounter (INDEPENDENT_AMBULATORY_CARE_PROVIDER_SITE_OTHER): Payer: Self-pay | Admitting: General Surgery

## 2012-01-20 VITALS — BP 124/90 | HR 80 | Temp 98.0°F | Resp 16 | Ht 71.0 in | Wt 245.4 lb

## 2012-01-20 DIAGNOSIS — K5732 Diverticulitis of large intestine without perforation or abscess without bleeding: Secondary | ICD-10-CM

## 2012-01-20 NOTE — Progress Notes (Signed)
Patient ID: Cameron Daniel, male   DOB: June 23, 1946, 66 y.o.   MRN: [id number]  Chief Complaint  Patient presents with  . New Evaluation    New PT: Eval Bowel Periforation    HPI Cameron Daniel is a 66 y.o. male.  This patient presents for evaluation of recurrent diverticulitis and a recent episode of propagated diverticulitis. He has had approximately one episode of left lower quadrant pain and suspected  Diverticulitis about once per year over the last 5 years. He recently presented to his primary care physician at the end of March for what he thought was a recurrent episode of his diverticulitis. He was given a prescription for Cipro and Flagyl and attempted to treat this as an outpatient. His symptoms became progressively worse and he presented to the [facility] for evaluation. He had a CT scan there which demonstrated diverticulitis with some small areas of localized air and a small abscess. There did not treat this surgically. He did not require drain placement. He stated 4 days in the hospital and his symptoms improved and he was discharged home on antibiotic therapy. Since then he has been doing fairly well and has only had one day of left lower quadrant pain where he thought that he might be having another episode but this resolved after a short period. His bowels are otherwise normal his lungs he takes Questran otherwise he complains of diarrhea. He denies any blood in the stool or melena. This episode started a suprapubic pain which radiated to his back and had some associated dark urine and fevers and chills. HPI  Past Medical History  Diagnosis Date  . Coronary atherosclerosis of unspecified type of vessel, native or graft   . Unspecified essential hypertension   . Other and unspecified hyperlipidemia   . Obesity, unspecified   . Esophageal reflux   . Nocturia   . Flushing   . Other malaise and fatigue   . Type II or unspecified type diabetes mellitus without mention of  complication, not stated as uncontrolled   . Allergic rhinitis, cause unspecified   . Other acquired absence of organ   . Psychosexual dysfunction with inhibited sexual excitement   . Irritable bowel syndrome   . Diverticulosis of colon (without mention of hemorrhage)   . Personal history of colonic polyps     Past Surgical History  Procedure Date  . Elbow bursa surgery   . Cholecystectomy   . Shoulder surgery     Rt. Shoulder    Family History  Problem Relation Age of Onset  . Lung cancer Father   . Cancer Father     Lung  . Coronary artery disease Mother   . Colon cancer Neg Hx     Social History History  Substance Use Topics  . Smoking status: Former Smoker    Quit date: 08/30/1983  . Smokeless tobacco: Former Neurosurgeon  . Alcohol Use: No    Allergies  Allergen Reactions  . Doxazosin Mesylate   . Penicillins   . Sulfonamide Derivatives     Current Outpatient Prescriptions  Medication Sig Dispense Refill  . aspirin 81 MG tablet Take 81 mg by mouth daily.        . cholestyramine (QUESTRAN) 4 G packet Take 1 packet by mouth 3 (three) times daily with meals.        . Ergocalciferol (VITAMIN D2 PO) Take 1 tablet by mouth daily.        . fexofenadine (ALLEGRA) 180 MG  tablet Take 180 mg by mouth daily.        . magnesium oxide (MAG-OX) 400 MG tablet Take 400 mg by mouth daily.        . Multiple Vitamin (MULTIVITAMIN) tablet Take 1 tablet by mouth daily.        Marland Kitchen olmesartan (BENICAR) 40 MG tablet Take 40 mg by mouth daily.        . pantoprazole (PROTONIX) 40 MG tablet Take 40 mg by mouth daily.        . pioglitazone (ACTOS) 15 MG tablet Take 15 mg by mouth daily.        . simvastatin (ZOCOR) 20 MG tablet Take 20 mg by mouth at bedtime.          Review of Systems Review of Systems All other review of systems negative or noncontributory except as stated in the HPI  Blood pressure 124/90, pulse 80, temperature 98 F (36.7 C), temperature source Temporal, resp. rate 16,  height 5\' 11"  (1.803 m), weight 245 lb 6.4 oz (111.313 kg).  Physical Exam Physical Exam Physical Exam  Vitals reviewed. Constitutional: He is oriented to person, place, and time. He appears well-developed and well-nourished. No distress.  HENT:  Head: Normocephalic and atraumatic.  Mouth/Throat: No oropharyngeal exudate.  Eyes: Conjunctivae and EOM are normal. Pupils are equal, round, and reactive to light. Right eye exhibits no discharge. Left eye exhibits no discharge. No scleral icterus.  Neck: Normal range of motion. No tracheal deviation present.  Cardiovascular: Normal rate, regular rhythm and normal heart sounds.   Pulmonary/Chest: Effort normal and breath sounds normal. No stridor. No respiratory distress. He has no wheezes. He has no rales. He exhibits no tenderness.  Abdominal: Soft. Bowel sounds are normal. He exhibits no distension and no mass. There is no tenderness. There is no rebound and no guarding.  Musculoskeletal: Normal range of motion. He exhibits no edema and no tenderness.  Neurological: He is alert and oriented to person, place, and time.  Skin: Skin is warm and dry. No rash noted. He is not diaphoretic. No erythema. No pallor.  Psychiatric: He has a normal mood and affect. His behavior is normal. Judgment and thought content normal.    [facility] report  Assessment    Recurrent diverticulitis with recent episode of complicated diverticulitis I had a lengthy discussion with the patient and his wife about the treatment options of continued observation and expectant management versus elective sigmoid colectomy to prevent recurrence. I explained that given his multiple episodes of recurrent diverticulitis as well as a single episode of recent complicated diverticulitis with an abscess, the standard recommendation would be for elective sigmoid colectomy. We discussed the pros and cons of observation versus surgery and we discussed the surgical  procedure in expected postoperative course. I recommended that we proceed with elective laparoscopic sigmoid colectomy and he is going to think about the options that we discussed and let me know if he would like to go ahead and schedule the procedure. I explained that he may have no further episodes of diverticulitis in the future or he could have a complicated episode of perforated diverticulitis and potentially require colostomy. Again, he would like to think about his options and he will let me know if you would like to schedule laparoscopic sigmoid colectomy. If he does want to proceed with surgery, I would recommend repeat CT scan of the abdomen to evaluate for resolution of his inflammatory changes and abscess.  Plan    He will let me know if he would like to proceed with CT scans and elective laparoscopic sigmoid colectomy.       [name] DAVID 01/20/2012, 4:43 PM

## 2012-01-24 ENCOUNTER — Telehealth (INDEPENDENT_AMBULATORY_CARE_PROVIDER_SITE_OTHER): Payer: Self-pay

## 2012-01-24 ENCOUNTER — Other Ambulatory Visit (INDEPENDENT_AMBULATORY_CARE_PROVIDER_SITE_OTHER): Payer: Self-pay

## 2012-01-24 DIAGNOSIS — K5792 Diverticulitis of intestine, part unspecified, without perforation or abscess without bleeding: Secondary | ICD-10-CM

## 2012-01-24 NOTE — Telephone Encounter (Signed)
Pt's wife calling to see about the Ct scan that is going to be scheduled for her husband. They are wanting to get the scan done ASAP. Pls call first thing on Wednesday.

## 2012-01-26 ENCOUNTER — Telehealth (INDEPENDENT_AMBULATORY_CARE_PROVIDER_SITE_OTHER): Payer: Self-pay

## 2012-01-26 NOTE — Telephone Encounter (Signed)
Patient CT rescheduled to later date closer to date of surgery, patient aware new date & time for CT abd pelvis 02/23/12 @ 8:15 (drink 1 bottle of contrast at 6:30 & 1 bottle of contrast @ 7:30, NPO 4 hours prior)

## 2012-01-27 ENCOUNTER — Other Ambulatory Visit: Payer: Non-veteran care

## 2012-01-31 ENCOUNTER — Encounter (INDEPENDENT_AMBULATORY_CARE_PROVIDER_SITE_OTHER): Payer: Self-pay

## 2012-02-23 ENCOUNTER — Ambulatory Visit
Admission: RE | Admit: 2012-02-23 | Discharge: 2012-02-23 | Disposition: A | Discharge status: Home / Self Care | Payer: Medicare Other | Source: Ambulatory Visit | Attending: General Surgery | Admitting: General Surgery

## 2012-02-23 ENCOUNTER — Telehealth (INDEPENDENT_AMBULATORY_CARE_PROVIDER_SITE_OTHER): Payer: Self-pay

## 2012-02-23 DIAGNOSIS — K5792 Diverticulitis of intestine, part unspecified, without perforation or abscess without bleeding: Secondary | ICD-10-CM

## 2012-02-23 MED ORDER — IOHEXOL 300 MG/ML  SOLN
125.0000 mL | Freq: Once | INTRAMUSCULAR | Status: AC | PRN
  Administered 2012-02-23: 125 mL via INTRAVENOUS

## 2012-02-23 NOTE — Telephone Encounter (Signed)
Faxed previous CT abd/pelvis reports to [facility] [phone].

## 2012-02-27 ENCOUNTER — Telehealth (INDEPENDENT_AMBULATORY_CARE_PROVIDER_SITE_OTHER): Payer: Self-pay | Admitting: General Surgery

## 2012-02-27 NOTE — Telephone Encounter (Signed)
Pt's wife calling for CT results (had it late last week.)  OK to leave results on answering machine:  [phone].

## 2012-02-28 NOTE — Telephone Encounter (Signed)
CT results given to patient.

## 2012-03-09 ENCOUNTER — Telehealth (INDEPENDENT_AMBULATORY_CARE_PROVIDER_SITE_OTHER): Payer: Self-pay

## 2012-03-09 NOTE — Telephone Encounter (Signed)
Pt calling b/c he wants Dr Delice Lesch opinion of the CT scan he had done a few weeks a go before he has surgery on 03/28/12. Pls call at home or cell.

## 2012-03-13 NOTE — Telephone Encounter (Signed)
Paged Dr. Biagio Quint with patient contact phone numbers.

## 2012-03-14 ENCOUNTER — Telehealth (INDEPENDENT_AMBULATORY_CARE_PROVIDER_SITE_OTHER): Payer: Self-pay

## 2012-03-14 NOTE — Telephone Encounter (Deleted)
Message copied by Maryan Puls on Wed Mar 14, 2012  6:08 PM ------      Message from: Marnette Burgess      Created: Wed Mar 14, 2012  1:00 PM      Contact: [phone]       Patient called to sched a pre-op appt, shceduled for sx on 03/28/12, please call.

## 2012-03-14 NOTE — Telephone Encounter (Signed)
Patient notified of Pre-Op appointment given to patient for 03/23/12 @ 12:15 w/Dr. Biagio Quint.  Patient scheduled for Sigmoid Colectomy on 03/28/12.

## 2012-03-20 ENCOUNTER — Encounter (HOSPITAL_COMMUNITY): Payer: Self-pay | Admitting: Pharmacy Technician

## 2012-03-22 NOTE — Progress Notes (Signed)
Need orders placed in EPIC for preop appointmetn on 03/23/12 at 0830am. Thanks.

## 2012-03-22 NOTE — Patient Instructions (Signed)
[address] BRAKEBILL  03/22/2012   Your procedure is scheduled on:  03/28/12 0730aqm-1100am  Report to [facility] at 0530 AM.  Call this number if you have problems the morning of surgery: [phone]   Remember:   Do not eat food:After Midnight.  May have clear liquids:until Midnight .    Take these medicines the morning of surgery with A SIP OF WATER:    Do not wear jewelry,   Do not wear lotions, powders, or perfumes.  . Men may shave face and neck.  Do not bring valuables to the hospital.  Contacts, dentures or bridgework may not be worn into surgery.  Leave suitcase in the car. After surgery it may be brought to your room.  For patients admitted to the hospital, checkout time is 11:00 AM the day of discharge.      Special Instructions: CHG Shower Use Special Wash: 1/2 bottle night before surgery and 1/2 bottle morning of surgery. shower chin to toes with CHG.  Wash face and private parts with regular soap.    Please read over the following fact sheets that you were given: MRSA Information, coughing and deep breathing exercises, leg exercises

## 2012-03-23 ENCOUNTER — Encounter (HOSPITAL_COMMUNITY): Payer: Self-pay

## 2012-03-23 ENCOUNTER — Encounter (INDEPENDENT_AMBULATORY_CARE_PROVIDER_SITE_OTHER): Payer: Self-pay | Admitting: General Surgery

## 2012-03-23 ENCOUNTER — Ambulatory Visit (INDEPENDENT_AMBULATORY_CARE_PROVIDER_SITE_OTHER): Payer: Medicare Other | Admitting: General Surgery

## 2012-03-23 ENCOUNTER — Encounter (HOSPITAL_COMMUNITY)
Admission: RE | Admit: 2012-03-23 | Discharge: 2012-03-23 | Disposition: A | Discharge status: Home / Self Care | Payer: Medicare Other | Source: Ambulatory Visit | Attending: General Surgery | Admitting: General Surgery

## 2012-03-23 VITALS — BP 124/76 | HR 70 | Temp 96.8°F | Resp 16 | Ht 71.0 in | Wt 254.4 lb

## 2012-03-23 DIAGNOSIS — K5732 Diverticulitis of large intestine without perforation or abscess without bleeding: Secondary | ICD-10-CM

## 2012-03-23 DIAGNOSIS — K5792 Diverticulitis of intestine, part unspecified, without perforation or abscess without bleeding: Secondary | ICD-10-CM

## 2012-03-23 HISTORY — DX: Depression, unspecified: F32.A

## 2012-03-23 HISTORY — DX: Acute myocardial infarction, unspecified: I21.9

## 2012-03-23 HISTORY — DX: Major depressive disorder, single episode, unspecified: F32.9

## 2012-03-23 LAB — COMPREHENSIVE METABOLIC PANEL
BUN: 12 mg/dL (ref 6–23)
Calcium: 10.2 mg/dL (ref 8.4–10.5)
GFR calc Af Amer: >90 mL/min (ref >90)
Glucose, Bld: 133 mg/dL — ABNORMAL HIGH (ref 70–99)
Sodium: 137 mEq/L (ref 135–145)
Total Protein: 6.9 g/dL (ref 6.0–8.3)

## 2012-03-23 LAB — CBC
HCT: 45.6 % (ref 39.0–52.0)
Hemoglobin: 15.3 g/dL (ref 13.0–17.0)
MCH: 29.4 pg (ref 26.0–34.0)
MCHC: 33.6 g/dL (ref 30.0–36.0)
RDW: 14.1 % (ref 11.5–15.5)

## 2012-03-23 LAB — SURGICAL PCR SCREEN: MRSA, PCR: NEGATIVE

## 2012-03-23 NOTE — Progress Notes (Signed)
03/23/12 Requested by fax with medical release form information regarding most recent ekg, stress test, cxr and cath report from [facility].

## 2012-03-23 NOTE — Progress Notes (Signed)
Patient ID: Cameron Daniel, male   DOB: 1945/10/11, 66 y.o.   MRN: [id number]  Chief Complaint  Patient presents with  . Pre-op Exam    Sigmoid colectomy    HPI Cameron Daniel is a 66 y.o. male.  HPI This patient presents for preoperative evaluation for sigmoid colectomy. He has a history of several episodes of diverticulitis approximately one year over the last 5 years. He most recently had an attack of diverticulitis with abscess and a small amount of contained free air back in March well admitted at [facility] and this was treated nonoperatively. He has improved and had a recent CT scan which showed some mild thickening posterior to the sigmoid colon but otherwise his infiltrate changes seem to have improved and he has been symptom free since then as well. Past Medical History  Diagnosis Date  . Unspecified essential hypertension   . Other and unspecified hyperlipidemia   . Obesity, unspecified   . Esophageal reflux   . Nocturia   . Flushing   . Other malaise and fatigue   . Type II or unspecified type diabetes mellitus without mention of complication, not stated as uncontrolled   . Allergic rhinitis, cause unspecified   . Other acquired absence of organ   . Psychosexual dysfunction with inhibited sexual excitement   . Irritable bowel syndrome   . Diverticulosis of colon (without mention of hemorrhage)   . Personal history of colonic polyps   . Coronary atherosclerosis of unspecified type of vessel, native or graft     1985 , arthroplasty x 1   . Myocardial infarction   . Depression     PTSD     Past Surgical History  Procedure Date  . Elbow bursa surgery   . Cholecystectomy   . Shoulder surgery     Rt. Shoulder  . Carpal tunnel release     left   . Coronary angioplasty     1985     Family History  Problem Relation Age of Onset  . Lung cancer Father   . Cancer Father     Lung  . Coronary artery disease Mother   . Colon cancer Neg Hx     Social  History History  Substance Use Topics  . Smoking status: Former Smoker    Quit date: 08/30/1983  . Smokeless tobacco: Former Neurosurgeon  . Alcohol Use: Yes     rare    Allergies  Allergen Reactions  . Doxazosin Mesylate     Reaction=unknown  . Penicillins Rash  . Sulfonamide Derivatives Rash    Current Outpatient Prescriptions  Medication Sig Dispense Refill  . citalopram (CELEXA) 10 MG tablet Take 10 mg by mouth daily.      Marland Kitchen aspirin 81 MG tablet Take 81 mg by mouth every morning.       . cholecalciferol (VITAMIN D) 1000 UNITS tablet Take 2,000 Units by mouth daily.      . cholestyramine (QUESTRAN) 4 G packet Take 1 packet by mouth 3 (three) times daily with meals.       . fexofenadine (ALLEGRA) 180 MG tablet Take 180 mg by mouth daily as needed. For allergies      . magnesium oxide (MAG-OX) 400 MG tablet Take 400 mg by mouth daily.       . Multiple Vitamin (MULTIVITAMIN) tablet Take 1 tablet by mouth daily.       Marland Kitchen olmesartan (BENICAR) 20 MG tablet Take 20 mg by mouth every morning.      Marland Kitchen  pantoprazole (PROTONIX) 40 MG tablet Take 40 mg by mouth every morning.       . pioglitazone (ACTOS) 15 MG tablet Take 15 mg by mouth every morning.       . simvastatin (ZOCOR) 20 MG tablet Take 20 mg by mouth at bedtime.       . Testosterone (ANDROGEL) 20.25 MG/1.25GM (1.62%) GEL Place onto the skin daily. 2 pumps daily        Review of Systems Review of Systems  Blood pressure 124/76, pulse 70, temperature 96.8 F (36 C), temperature source Temporal, resp. rate 16, height 5\' 11"  (1.803 m), weight 254 lb 6 oz (115.384 kg).  Physical Exam Physical Exam Physical Exam  Vitals reviewed. Constitutional: He is oriented to person, place, and time. He appears well-developed and well-nourished. No distress.  HENT:  Head: Normocephalic and atraumatic.  Mouth/Throat: No oropharyngeal exudate.  Eyes: Conjunctivae and EOM are normal. Pupils are equal, round, and reactive to light. Right eye exhibits  no discharge. Left eye exhibits no discharge. No scleral icterus.  Neck: Normal range of motion. No tracheal deviation present.  Cardiovascular: Normal rate, regular rhythm and normal heart sounds.   Pulmonary/Chest: Effort normal and breath sounds normal. No stridor. No respiratory distress. He has no wheezes. He has no rales. He exhibits no tenderness.  Abdominal: Soft. Bowel sounds are normal. He exhibits no distension and no mass. There is no tenderness. There is no rebound and no guarding.  Musculoskeletal: Normal range of motion. He exhibits no edema and no tenderness.  Neurological: He is alert and oriented to person, place, and time.  Skin: Skin is warm and dry. No rash noted. He is not diaphoretic. No erythema. No pallor.  Psychiatric: He has a normal mood and affect. His behavior is normal. Judgment and thought content normal.    Data Reviewed CT  Assessment    Recurrent diverticulitis Since he has had recurrent diverticulitis as well as the episode of complicated diverticulitis I discussed the options with him again of continued observation versus elective sigmoid colectomy and he is interested in sigmoid colectomy to minimize the chance of recurrence. I discussed with him the procedure and the perioperative management as well as the risks of the procedure. We discussed the risks of infection, bleeding, pain, scarring, hernia, and recurrence, injury to bowels or ureter, possible open, and anastomotic leakage and the possible need for reoperation or ostomy and he expressed understanding and desires to proceed with elective sigmoid colectomy.    Plan    We will set him up for laparoscopic sigmoid colectomy possible open procedure.       [name] DAVID 03/23/2012, 1:39 PM

## 2012-03-23 NOTE — Progress Notes (Signed)
03/23/12 0904  OBSTRUCTIVE SLEEP APNEA  Have you ever been diagnosed with sleep apnea through a sleep study? No  Do you snore loudly (loud enough to be heard through closed doors)?  0  Do you often feel tired, fatigued, or sleepy during the daytime? 0  Has anyone observed you stop breathing during your sleep? 0  Do you have, or are you being treated for high blood pressure? 1  BMI more than 35 kg/m2? 1  Age over 65 years old? 1  Gender: 1  Obstructive Sleep Apnea Score 4   Score 4 or greater  Updated health history

## 2012-03-23 NOTE — Progress Notes (Signed)
03/12/10 Stress Test from [facility] on chart  EKG 02/23/10 and 08/20/09 on chart  EKG 03/23/12 on chart

## 2012-03-26 ENCOUNTER — Telehealth (INDEPENDENT_AMBULATORY_CARE_PROVIDER_SITE_OTHER): Payer: Self-pay

## 2012-03-26 NOTE — Progress Notes (Signed)
03/26/12  Office called due to orders not in EPIC.  Nurse made aware at office and stated she would have MD put orders in EPIC.

## 2012-03-26 NOTE — Telephone Encounter (Signed)
Pt called with questions about pre op.  He is a Type II DM and wanted to know if he would be ok on liquids until surgery 7/31.  I instructed him to check his sugar daily, and gave him more options for liquids such as popsicles, jello, and broths.  He will call if his sugar drops or if he has other concerns.

## 2012-03-27 ENCOUNTER — Other Ambulatory Visit (INDEPENDENT_AMBULATORY_CARE_PROVIDER_SITE_OTHER): Payer: Self-pay | Admitting: General Surgery

## 2012-03-27 DIAGNOSIS — K5792 Diverticulitis of intestine, part unspecified, without perforation or abscess without bleeding: Secondary | ICD-10-CM

## 2012-03-27 NOTE — Progress Notes (Signed)
03/27/12/Pt is scheduled for surgery on 03/28/12 .  Need orders placed in EPIC.  Thank You.

## 2012-03-27 NOTE — Progress Notes (Signed)
Pt was notified of time change for OR. He will be here at [facility] 03/28/12

## 2012-03-28 ENCOUNTER — Encounter (HOSPITAL_COMMUNITY): Payer: Self-pay | Admitting: *Deleted

## 2012-03-28 ENCOUNTER — Encounter (HOSPITAL_COMMUNITY): Payer: Self-pay | Admitting: Registered Nurse

## 2012-03-28 ENCOUNTER — Inpatient Hospital Stay (HOSPITAL_COMMUNITY)
Admission: RE | Admit: 2012-03-28 | Discharge: 2012-04-07 | DRG: 330 | Disposition: A | Discharge status: Home Health | Payer: Medicare Other | Source: Ambulatory Visit | Attending: General Surgery | Admitting: General Surgery

## 2012-03-28 ENCOUNTER — Ambulatory Visit (HOSPITAL_COMMUNITY): Payer: Medicare Other | Admitting: Registered Nurse

## 2012-03-28 ENCOUNTER — Encounter (HOSPITAL_COMMUNITY)
Admission: RE | Disposition: A | Discharge status: Home Health | Payer: Self-pay | Source: Ambulatory Visit | Attending: General Surgery

## 2012-03-28 DIAGNOSIS — T8140XA Infection following a procedure, unspecified, initial encounter: Secondary | ICD-10-CM | POA: Diagnosis not present

## 2012-03-28 DIAGNOSIS — M109 Gout, unspecified: Secondary | ICD-10-CM

## 2012-03-28 DIAGNOSIS — E669 Obesity, unspecified: Secondary | ICD-10-CM | POA: Diagnosis present

## 2012-03-28 DIAGNOSIS — K5732 Diverticulitis of large intestine without perforation or abscess without bleeding: Principal | ICD-10-CM | POA: Diagnosis present

## 2012-03-28 DIAGNOSIS — I2510 Atherosclerotic heart disease of native coronary artery without angina pectoris: Secondary | ICD-10-CM | POA: Diagnosis present

## 2012-03-28 DIAGNOSIS — I10 Essential (primary) hypertension: Secondary | ICD-10-CM | POA: Diagnosis present

## 2012-03-28 DIAGNOSIS — Z9861 Coronary angioplasty status: Secondary | ICD-10-CM

## 2012-03-28 DIAGNOSIS — E119 Type 2 diabetes mellitus without complications: Secondary | ICD-10-CM | POA: Diagnosis present

## 2012-03-28 DIAGNOSIS — E1169 Type 2 diabetes mellitus with other specified complication: Secondary | ICD-10-CM | POA: Diagnosis present

## 2012-03-28 DIAGNOSIS — Z01812 Encounter for preprocedural laboratory examination: Secondary | ICD-10-CM

## 2012-03-28 DIAGNOSIS — Y838 Other surgical procedures as the cause of abnormal reaction of the patient, or of later complication, without mention of misadventure at the time of the procedure: Secondary | ICD-10-CM | POA: Diagnosis not present

## 2012-03-28 DIAGNOSIS — I252 Old myocardial infarction: Secondary | ICD-10-CM

## 2012-03-28 DIAGNOSIS — K5792 Diverticulitis of intestine, part unspecified, without perforation or abscess without bleeding: Secondary | ICD-10-CM

## 2012-03-28 LAB — CBC
MCH: 30.3 pg (ref 26.0–34.0)
MCHC: 33.9 g/dL (ref 30.0–36.0)
MCV: 89.3 fL (ref 78.0–100.0)
Platelets: 149 10*3/uL — ABNORMAL LOW (ref 150–400)
RDW: 14 % (ref 11.5–15.5)
WBC: 9.9 10*3/uL (ref 4.0–10.5)

## 2012-03-28 LAB — GLUCOSE, CAPILLARY: Glucose-Capillary: 139 mg/dL — ABNORMAL HIGH (ref 70–99)

## 2012-03-28 LAB — TYPE AND SCREEN
ABO/RH(D): B POS
Antibody Screen: NEGATIVE

## 2012-03-28 LAB — ABO/RH: ABO/RH(D): B POS

## 2012-03-28 SURGERY — COLECTOMY, SIGMOID, LAPAROSCOPIC
Anesthesia: General | Wound class: Clean Contaminated

## 2012-03-28 MED ORDER — PROPOFOL 10 MG/ML IV EMUL
INTRAVENOUS | Status: DC | PRN
  Administered 2012-03-28: 160 mg via INTRAVENOUS
  Administered 2012-03-28: 40 mg via INTRAVENOUS

## 2012-03-28 MED ORDER — CIPROFLOXACIN IN D5W 400 MG/200ML IV SOLN
400.0000 mg | Freq: Two times a day (BID) | INTRAVENOUS | Status: AC
  Administered 2012-03-28: 400 mg via INTRAVENOUS
  Filled 2012-03-28: qty 200

## 2012-03-28 MED ORDER — LIDOCAINE-EPINEPHRINE (PF) 1 %-1:200000 IJ SOLN
INTRAMUSCULAR | Status: DC | PRN
  Administered 2012-03-28: 3.5 mL

## 2012-03-28 MED ORDER — METRONIDAZOLE IN NACL 5-0.79 MG/ML-% IV SOLN
INTRAVENOUS | Status: AC
  Filled 2012-03-28: qty 100

## 2012-03-28 MED ORDER — HYDROMORPHONE HCL PF 1 MG/ML IJ SOLN
0.2500 mg | INTRAMUSCULAR | Status: DC | PRN
  Administered 2012-03-28 (×4): 0.5 mg via INTRAVENOUS

## 2012-03-28 MED ORDER — ONDANSETRON HCL 4 MG/2ML IJ SOLN
4.0000 mg | Freq: Four times a day (QID) | INTRAMUSCULAR | Status: DC | PRN
  Filled 2012-03-28: qty 2

## 2012-03-28 MED ORDER — SODIUM CHLORIDE 0.9 % IJ SOLN: 9.0000 mL | INTRAMUSCULAR | Status: DC | PRN

## 2012-03-28 MED ORDER — HETASTARCH-ELECTROLYTES 6 % IV SOLN
INTRAVENOUS | Status: DC | PRN
  Administered 2012-03-28 (×2): via INTRAVENOUS

## 2012-03-28 MED ORDER — MORPHINE SULFATE (PF) 1 MG/ML IV SOLN
INTRAVENOUS | Status: AC
  Filled 2012-03-28: qty 25

## 2012-03-28 MED ORDER — METRONIDAZOLE IN NACL 5-0.79 MG/ML-% IV SOLN
500.0000 mg | INTRAVENOUS | Status: AC
Start: 2012-03-28 — End: 2012-03-28
  Administered 2012-03-28: .5 g via INTRAVENOUS

## 2012-03-28 MED ORDER — LIDOCAINE-EPINEPHRINE 1 %-1:100000 IJ SOLN
INTRAMUSCULAR | Status: AC
  Filled 2012-03-28: qty 1

## 2012-03-28 MED ORDER — ONDANSETRON HCL 4 MG/2ML IJ SOLN
4.0000 mg | Freq: Four times a day (QID) | INTRAMUSCULAR | Status: DC | PRN
  Administered 2012-03-28 – 2012-03-29 (×2): 4 mg via INTRAVENOUS
  Filled 2012-03-28: qty 2

## 2012-03-28 MED ORDER — EPHEDRINE SULFATE 50 MG/ML IJ SOLN
INTRAMUSCULAR | Status: DC | PRN
  Administered 2012-03-28: 5 mg via INTRAVENOUS

## 2012-03-28 MED ORDER — PROMETHAZINE HCL 25 MG/ML IJ SOLN
6.2500 mg | INTRAMUSCULAR | Status: DC | PRN
  Administered 2012-03-28: 12.5 mg via INTRAVENOUS

## 2012-03-28 MED ORDER — ONDANSETRON HCL 4 MG/2ML IJ SOLN
INTRAMUSCULAR | Status: DC | PRN
  Administered 2012-03-28: 4 mg via INTRAVENOUS

## 2012-03-28 MED ORDER — GLYCOPYRROLATE 0.2 MG/ML IJ SOLN
INTRAMUSCULAR | Status: DC | PRN
  Administered 2012-03-28: .4 mg via INTRAVENOUS
  Administered 2012-03-28: .6 mg via INTRAVENOUS

## 2012-03-28 MED ORDER — 0.9 % SODIUM CHLORIDE (POUR BTL) OPTIME
TOPICAL | Status: DC | PRN
  Administered 2012-03-28: 1000 mL

## 2012-03-28 MED ORDER — BUPIVACAINE HCL 0.25 % IJ SOLN
INTRAMUSCULAR | Status: DC | PRN
  Administered 2012-03-28: 3.5 mL

## 2012-03-28 MED ORDER — HEPARIN SODIUM (PORCINE) 5000 UNIT/ML IJ SOLN
5000.0000 [IU] | Freq: Once | INTRAMUSCULAR | Status: AC
  Administered 2012-03-28: 5000 [IU] via SUBCUTANEOUS
  Filled 2012-03-28: qty 1

## 2012-03-28 MED ORDER — NEOSTIGMINE METHYLSULFATE 1 MG/ML IJ SOLN
INTRAMUSCULAR | Status: DC | PRN
  Administered 2012-03-28: 3 mg via INTRAVENOUS
  Administered 2012-03-28: 2 mg via INTRAVENOUS

## 2012-03-28 MED ORDER — STERILE WATER FOR IRRIGATION IR SOLN
Status: DC | PRN
  Administered 2012-03-28: 1

## 2012-03-28 MED ORDER — ENOXAPARIN SODIUM 40 MG/0.4ML ~~LOC~~ SOLN
40.0000 mg | SUBCUTANEOUS | Status: DC
  Filled 2012-03-28: qty 0.4

## 2012-03-28 MED ORDER — DIPHENHYDRAMINE HCL 12.5 MG/5ML PO ELIX
12.5000 mg | ORAL_SOLUTION | Freq: Four times a day (QID) | ORAL | Status: DC | PRN
  Filled 2012-03-28: qty 5

## 2012-03-28 MED ORDER — HYDROMORPHONE HCL PF 1 MG/ML IJ SOLN
INTRAMUSCULAR | Status: DC | PRN
  Administered 2012-03-28 (×2): 1 mg via INTRAVENOUS

## 2012-03-28 MED ORDER — ROCURONIUM BROMIDE 100 MG/10ML IV SOLN
INTRAVENOUS | Status: DC | PRN
  Administered 2012-03-28 (×4): 10 mg via INTRAVENOUS
  Administered 2012-03-28: 60 mg via INTRAVENOUS
  Administered 2012-03-28: 10 mg via INTRAVENOUS

## 2012-03-28 MED ORDER — ONDANSETRON HCL 4 MG PO TABS: 4.0000 mg | ORAL_TABLET | Freq: Four times a day (QID) | ORAL | Status: DC | PRN

## 2012-03-28 MED ORDER — SUFENTANIL CITRATE 50 MCG/ML IV SOLN
INTRAVENOUS | Status: DC | PRN
  Administered 2012-03-28 (×2): 10 ug via INTRAVENOUS
  Administered 2012-03-28 (×2): 5 ug via INTRAVENOUS
  Administered 2012-03-28: 10 ug via INTRAVENOUS
  Administered 2012-03-28: 15 ug via INTRAVENOUS
  Administered 2012-03-28 (×3): 10 ug via INTRAVENOUS
  Administered 2012-03-28: 5 ug via INTRAVENOUS
  Administered 2012-03-28 (×2): 10 ug via INTRAVENOUS

## 2012-03-28 MED ORDER — MORPHINE SULFATE (PF) 1 MG/ML IV SOLN
INTRAVENOUS | Status: DC
  Administered 2012-03-28: 13:00:00 via INTRAVENOUS
  Administered 2012-03-29: 3 mg via INTRAVENOUS
  Administered 2012-03-29: 4.2 mg via INTRAVENOUS
  Administered 2012-03-29: 7.5 mg via INTRAVENOUS
  Administered 2012-03-29: 15:00:00 via INTRAVENOUS
  Administered 2012-03-30: 1.5 mg via INTRAVENOUS
  Administered 2012-03-30: 3 mg via INTRAVENOUS
  Administered 2012-03-30 – 2012-03-31 (×4): 1.5 mg via INTRAVENOUS
  Administered 2012-03-31: 3 mg via INTRAVENOUS
  Administered 2012-03-31: 1.5 mg via INTRAVENOUS
  Administered 2012-04-01: 3 mg via INTRAVENOUS
  Administered 2012-04-01: 4 mg via INTRAVENOUS
  Administered 2012-04-01 (×2): 4.5 mg via INTRAVENOUS
  Administered 2012-04-01: 3 mg via INTRAVENOUS
  Administered 2012-04-01: 4.5 mg via INTRAVENOUS
  Administered 2012-04-02: 1.5 mg via INTRAVENOUS
  Administered 2012-04-02: 3 mg via INTRAVENOUS
  Administered 2012-04-02: 4.5 mg via INTRAVENOUS
  Administered 2012-04-03: 3 mg via INTRAVENOUS
  Administered 2012-04-03: 1.5 mg via INTRAVENOUS
  Filled 2012-03-28 (×3): qty 25

## 2012-03-28 MED ORDER — ENOXAPARIN SODIUM 60 MG/0.6ML ~~LOC~~ SOLN
60.0000 mg | SUBCUTANEOUS | Status: DC
  Administered 2012-03-29 – 2012-04-06 (×9): 60 mg via SUBCUTANEOUS
  Filled 2012-03-28 (×11): qty 0.6

## 2012-03-28 MED ORDER — CIPROFLOXACIN IN D5W 400 MG/200ML IV SOLN
INTRAVENOUS | Status: AC
  Filled 2012-03-28: qty 200

## 2012-03-28 MED ORDER — INSULIN ASPART 100 UNIT/ML ~~LOC~~ SOLN
0.0000 [IU] | SUBCUTANEOUS | Status: DC
  Administered 2012-03-28: 1 [IU] via SUBCUTANEOUS
  Administered 2012-03-28: 3 [IU] via SUBCUTANEOUS
  Administered 2012-03-29 (×2): 2 [IU] via SUBCUTANEOUS
  Administered 2012-03-29: 3 [IU] via SUBCUTANEOUS
  Administered 2012-03-29: 2 [IU] via SUBCUTANEOUS
  Administered 2012-03-29 (×2): 3 [IU] via SUBCUTANEOUS
  Administered 2012-03-30 (×4): 2 [IU] via SUBCUTANEOUS
  Administered 2012-03-30 – 2012-03-31 (×2): 3 [IU] via SUBCUTANEOUS
  Administered 2012-03-31 – 2012-04-02 (×11): 2 [IU] via SUBCUTANEOUS
  Administered 2012-04-03: 3 [IU] via SUBCUTANEOUS
  Administered 2012-04-03 – 2012-04-04 (×4): 2 [IU] via SUBCUTANEOUS
  Administered 2012-04-04: 3 [IU] via SUBCUTANEOUS
  Administered 2012-04-04 – 2012-04-05 (×2): 2 [IU] via SUBCUTANEOUS
  Administered 2012-04-05: 3 [IU] via SUBCUTANEOUS
  Administered 2012-04-05: 2 [IU] via SUBCUTANEOUS

## 2012-03-28 MED ORDER — METRONIDAZOLE IN NACL 5-0.79 MG/ML-% IV SOLN
500.0000 mg | Freq: Three times a day (TID) | INTRAVENOUS | Status: AC
  Administered 2012-03-28 – 2012-03-29 (×2): 500 mg via INTRAVENOUS
  Filled 2012-03-28 (×2): qty 100

## 2012-03-28 MED ORDER — SUCCINYLCHOLINE CHLORIDE 20 MG/ML IJ SOLN
INTRAMUSCULAR | Status: DC | PRN
  Administered 2012-03-28: 100 mg via INTRAVENOUS

## 2012-03-28 MED ORDER — HYDROMORPHONE HCL PF 1 MG/ML IJ SOLN
INTRAMUSCULAR | Status: AC
  Filled 2012-03-28: qty 1

## 2012-03-28 MED ORDER — BUPIVACAINE HCL (PF) 0.25 % IJ SOLN
INTRAMUSCULAR | Status: AC
  Filled 2012-03-28: qty 30

## 2012-03-28 MED ORDER — CIPROFLOXACIN IN D5W 400 MG/200ML IV SOLN
400.0000 mg | Freq: Once | INTRAVENOUS | Status: AC
  Administered 2012-03-28: 400 mg via INTRAVENOUS

## 2012-03-28 MED ORDER — LIDOCAINE HCL (CARDIAC) 20 MG/ML IV SOLN
INTRAVENOUS | Status: DC | PRN
  Administered 2012-03-28: 80 mg via INTRAVENOUS

## 2012-03-28 MED ORDER — LACTATED RINGERS IV SOLN
INTRAVENOUS | Status: DC | PRN
  Administered 2012-03-28: 14:00:00
  Administered 2012-03-28 (×3): via INTRAVENOUS

## 2012-03-28 MED ORDER — PROMETHAZINE HCL 25 MG/ML IJ SOLN
INTRAMUSCULAR | Status: AC
  Filled 2012-03-28: qty 1

## 2012-03-28 MED ORDER — DIPHENHYDRAMINE HCL 50 MG/ML IJ SOLN: 12.5000 mg | Freq: Four times a day (QID) | INTRAMUSCULAR | Status: DC | PRN

## 2012-03-28 MED ORDER — RINGERS IRRIGATION IR SOLN
Status: DC | PRN
  Administered 2012-03-28: 1

## 2012-03-28 MED ORDER — PHENYLEPHRINE HCL 10 MG/ML IJ SOLN
INTRAMUSCULAR | Status: DC | PRN
  Administered 2012-03-28 (×4): 80 ug via INTRAVENOUS

## 2012-03-28 MED ORDER — FLEET ENEMA 7-19 GM/118ML RE ENEM
1.0000 | ENEMA | Freq: Once | RECTAL | Status: AC
  Administered 2012-03-28: 1 via RECTAL
  Filled 2012-03-28: qty 1

## 2012-03-28 MED ORDER — LACTATED RINGERS IV SOLN
INTRAVENOUS | Status: DC
  Administered 2012-03-28: 21:00:00 via INTRAVENOUS

## 2012-03-28 MED ORDER — NALOXONE HCL 0.4 MG/ML IJ SOLN: 0.4000 mg | INTRAMUSCULAR | Status: DC | PRN

## 2012-03-28 MED ORDER — BIOTENE DRY MOUTH MT LIQD
15.0000 mL | Freq: Two times a day (BID) | OROMUCOSAL | Status: DC
  Administered 2012-03-28 – 2012-04-07 (×18): 15 mL via OROMUCOSAL

## 2012-03-28 MED ORDER — MIDAZOLAM HCL 5 MG/5ML IJ SOLN
INTRAMUSCULAR | Status: DC | PRN
  Administered 2012-03-28: 2 mg via INTRAVENOUS

## 2012-03-28 SURGICAL SUPPLY — 92 items
APPLIER CLIP 5 13 M/L LIGAMAX5 (MISCELLANEOUS)
APPLIER CLIP ROT 10 11.4 M/L (STAPLE)
APR CLP MED LRG 11.4X10 (STAPLE)
APR CLP MED LRG 5 ANG JAW (MISCELLANEOUS)
BLADE EXTENDED COATED 6.5IN (ELECTRODE) ×1 IMPLANT
BLADE HEX COATED 2.75 (ELECTRODE) ×2 IMPLANT
BLADE SURG SZ10 CARB STEEL (BLADE) ×1 IMPLANT
CANISTER SUCTION 2500CC (MISCELLANEOUS) ×2 IMPLANT
CELLS DAT CNTRL 66122 CELL SVR (MISCELLANEOUS) IMPLANT
CHLORAPREP W/TINT 26ML (MISCELLANEOUS) ×1 IMPLANT
CLAMP ENDO BABCK 10MM (STAPLE) IMPLANT
CLIP APPLIE 5 13 M/L LIGAMAX5 (MISCELLANEOUS) IMPLANT
CLIP APPLIE ROT 10 11.4 M/L (STAPLE) IMPLANT
CLOTH BEACON ORANGE TIMEOUT ST (SAFETY) ×2 IMPLANT
CONNECTOR 5 IN 1 STRAIGHT STRL (MISCELLANEOUS) IMPLANT
COVER MAYO STAND STRL (DRAPES) ×2 IMPLANT
COVER SURGICAL LIGHT HANDLE (MISCELLANEOUS) ×1 IMPLANT
DECANTER SPIKE VIAL GLASS SM (MISCELLANEOUS) ×2 IMPLANT
DEVICE TROCAR PUNCTURE CLOSURE (ENDOMECHANICALS) IMPLANT
DRAIN CHANNEL RND F F (WOUND CARE) ×1 IMPLANT
DRAPE LAPAROSCOPIC ABDOMINAL (DRAPES) ×2 IMPLANT
DRAPE LG THREE QUARTER DISP (DRAPES) ×1 IMPLANT
DRAPE UTILITY 15X26 (DRAPE) ×1 IMPLANT
DRAPE WARM FLUID 44X44 (DRAPE) ×3 IMPLANT
ELECT REM PT RETURN 9FT ADLT (ELECTROSURGICAL) ×2
ELECTRODE REM PT RTRN 9FT ADLT (ELECTROSURGICAL) ×1 IMPLANT
ENSEAL DEVICE STD TIP 35CM (ENDOMECHANICALS) IMPLANT
EVACUATOR DRAINAGE 10X20 100CC (DRAIN) IMPLANT
EVACUATOR SILICONE 100CC (DRAIN) ×2
GAUZE PACKING IODOFORM 1/4X5 (PACKING) ×1 IMPLANT
GLOVE BIOGEL PI IND STRL 7.0 (GLOVE) ×1 IMPLANT
GLOVE BIOGEL PI INDICATOR 7.0 (GLOVE) ×1
GOWN STRL NON-REIN LRG LVL3 (GOWN DISPOSABLE) ×1 IMPLANT
GOWN STRL REIN XL XLG (GOWN DISPOSABLE) ×6 IMPLANT
HAND ACTIVATED (MISCELLANEOUS) ×1 IMPLANT
KIT BASIN OR (CUSTOM PROCEDURE TRAY) ×2 IMPLANT
LEGGING LITHOTOMY PAIR STRL (DRAPES) ×1 IMPLANT
LIGASURE IMPACT 36 18CM CVD LR (INSTRUMENTS) ×1 IMPLANT
NS IRRIG 1000ML POUR BTL (IV SOLUTION) ×4 IMPLANT
PENCIL BUTTON HOLSTER BLD 10FT (ELECTRODE) ×2 IMPLANT
RETRACTOR WND ALEXIS 18 MED (MISCELLANEOUS) IMPLANT
RTRCTR WOUND ALEXIS 18CM MED (MISCELLANEOUS)
SCISSORS LAP 5X35 DISP (ENDOMECHANICALS) ×1 IMPLANT
SEALER TISSUE G2 CVD JAW 35 (ENDOMECHANICALS) IMPLANT
SEALER TISSUE G2 CVD JAW 45CM (ENDOMECHANICALS) ×1
SET IRRIG TUBING LAPAROSCOPIC (IRRIGATION / IRRIGATOR) ×1 IMPLANT
SLEEVE Z-THREAD 12X100MM (TROCAR) ×1 IMPLANT
SLEEVE Z-THREAD 5X100MM (TROCAR) IMPLANT
SOLUTION ANTI FOG 6CC (MISCELLANEOUS) ×2 IMPLANT
SPONGE GAUZE 4X4 12PLY (GAUZE/BANDAGES/DRESSINGS) ×1 IMPLANT
SPONGE LAP 18X18 X RAY DECT (DISPOSABLE) ×5 IMPLANT
STAPLER CUT CVD 40MM GREEN (STAPLE) ×1 IMPLANT
STAPLER CUT RELOAD GREEN (STAPLE) ×1 IMPLANT
STAPLER VISISTAT 35W (STAPLE) ×2 IMPLANT
STRIP CLOSURE SKIN 1/2X4 (GAUZE/BANDAGES/DRESSINGS) IMPLANT
SUCTION POOLE TIP (SUCTIONS) ×2 IMPLANT
SUT ETHILON 2 0 PS N (SUTURE) ×1 IMPLANT
SUT PDS AB 1 CT1 27 (SUTURE) IMPLANT
SUT PDS AB 1 CTX 36 (SUTURE) IMPLANT
SUT PDS AB 1 TP1 96 (SUTURE) ×2 IMPLANT
SUT PDS AB 3-0 SH 27 (SUTURE) ×2 IMPLANT
SUT PDS AB 4-0 SH 27 (SUTURE) IMPLANT
SUT PROLENE 2 0 KS (SUTURE) IMPLANT
SUT SILK 2 0 (SUTURE) ×2
SUT SILK 2 0 SH CR/8 (SUTURE) ×2 IMPLANT
SUT SILK 2-0 18XBRD TIE 12 (SUTURE) ×1 IMPLANT
SUT SILK 3 0 (SUTURE) ×2
SUT SILK 3 0 SH CR/8 (SUTURE) ×4 IMPLANT
SUT SILK 3-0 18XBRD TIE 12 (SUTURE) ×1 IMPLANT
SUT VIC AB 2-0 CT1 27 (SUTURE)
SUT VIC AB 2-0 CT1 27XBRD (SUTURE) IMPLANT
SUT VIC AB 3-0 PS2 18 (SUTURE)
SUT VIC AB 3-0 PS2 18XBRD (SUTURE) IMPLANT
SUT VIC AB 4-0 SH 18 (SUTURE) IMPLANT
SUT VICRYL 0 UR6 27IN ABS (SUTURE) IMPLANT
SYR 30ML LL (SYRINGE) ×1 IMPLANT
SYR BULB IRRIGATION 50ML (SYRINGE) ×2 IMPLANT
TAPE CLOTH SURG 4X10 WHT LF (GAUZE/BANDAGES/DRESSINGS) ×1 IMPLANT
TOWEL OR 17X26 10 PK STRL BLUE (TOWEL DISPOSABLE) ×3 IMPLANT
TRAY FOLEY CATH 14FRSI W/METER (CATHETERS) ×2 IMPLANT
TRAY LAP CHOLE (CUSTOM PROCEDURE TRAY) ×2 IMPLANT
TROCAR BALLN 12MMX100 BLUNT (TROCAR) ×1 IMPLANT
TROCAR BLADELESS OPT 5 100 (ENDOMECHANICALS) ×1 IMPLANT
TROCAR HASSON GELL 12X100 (TROCAR) IMPLANT
TROCAR Z-THREAD FIOS 11X100 BL (TROCAR) IMPLANT
TROCAR Z-THREAD FIOS 12X100MM (TROCAR) ×1 IMPLANT
TROCAR Z-THREAD FIOS 5X100MM (TROCAR) IMPLANT
TROCAR Z-THREAD SLEEVE 11X100 (TROCAR) IMPLANT
TUBING CONNECTING 10 (TUBING) IMPLANT
TUBING FILTER THERMOFLATOR (ELECTROSURGICAL) ×2 IMPLANT
YANKAUER SUCT BULB TIP 10FT TU (MISCELLANEOUS) ×2 IMPLANT
YANKAUER SUCT BULB TIP NO VENT (SUCTIONS) ×2 IMPLANT

## 2012-03-28 NOTE — Transfer of Care (Signed)
Immediate Anesthesia Transfer of Care Note  Patient: Cameron Daniel  Procedure(s) Performed: Procedure(s) (LRB): LAPAROSCOPIC SIGMOID COLECTOMY (N/A)  Patient Location: PACU  Anesthesia Type: General  Level of Consciousness: awake, alert , oriented and patient cooperative  Airway & Oxygen Therapy: Patient Spontanous Breathing and Patient connected to face mask oxygen  Post-op Assessment: Report given to PACU RN and Post -op Vital signs reviewed and stable  Post vital signs: stable  Complications: No apparent anesthesia complications

## 2012-03-28 NOTE — Progress Notes (Signed)
He is actually doing okay but I had requested stepdown given his low UOP during the case as well as his cardiac history so will move him to stepdown for observation.

## 2012-03-28 NOTE — Brief Op Note (Signed)
03/28/2012  12:29 PM  PATIENT:  Cameron Daniel  66 y.o. male  PRE-OPERATIVE DIAGNOSIS:  diverticulitis  POST-OPERATIVE DIAGNOSIS:  diverticulits  PROCEDURE:  Procedure(s) (LRB): LAPAROSCOPIC SIGMOID COLECTOMY (N/A)  SURGEON:  Surgeon(s) and Role:    * [name], DO - Primary    * Valarie Merino, MD - Assisting  PHYSICIAN ASSISTANT:   ASSISTANTS: martin   ANESTHESIA:   general  EBL:  Total I/O In: 3000 [I.V.:2000; IV Piggyback:1000] Out: 480 [Urine:180; Blood:300]  BLOOD ADMINISTERED:none  DRAINS: (69F) Jackson-Pratt drain(s) with closed bulb suction in the area of anastamosis   LOCAL MEDICATIONS USED:  NONE  SPECIMEN:  Source of Specimen:  sigmoid colon, suture distal  DISPOSITION OF SPECIMEN:  PATHOLOGY  COUNTS:  YES  TOURNIQUET:  * No tourniquets in log *  DICTATION: .Other Dictation: Dictation Number   PLAN OF CARE: Admit to inpatient   PATIENT DISPOSITION:  PACU - hemodynamically stable.   Delay start of Pharmacological VTE agent (>24hrs) due to surgical blood loss or risk of bleeding: no

## 2012-03-28 NOTE — Interval H&P Note (Signed)
History and Physical Interval Note:  03/28/2012 7:56 AM  Cameron Daniel  has presented today for surgery, with the diagnosis of diverticulitis  The various methods of treatment have been discussed with the patient and family. After consideration of risks, benefits and other options for treatment, the patient has consented to  Procedure(s) (LRB): LAPAROSCOPIC SIGMOID COLECTOMY (N/A) as a surgical intervention .  The patient's history has been reviewed, patient examined, no change in status, stable for surgery.  I have reviewed the patient's chart and labs.  Questions were answered to the patient's satisfaction.  Pt. Seen in preop area.  No changes from prior visit.  He will get one more enema this am and plan for lap sigmoidectomy.  We again discussed the risks of infection, bleeding, abscess, need for open surgery, injury to ureter, and anastamotic leakage or need for ostomy and he expressed understanding and desires to proceed with lap sigmoidectomy possible open.   Cameron Daniel

## 2012-03-28 NOTE — H&P (View-Only) (Signed)
Patient ID: Cameron Daniel, male   DOB: 08/25/1946, 66 y.o.   MRN: [id number]  Chief Complaint  Patient presents with  . Pre-op Exam    Sigmoid colectomy    HPI Cameron Daniel is a 66 y.o. male.  HPI This patient presents for preoperative evaluation for sigmoid colectomy. He has a history of several episodes of diverticulitis approximately one year over the last 5 years. He most recently had an attack of diverticulitis with abscess and a small amount of contained free air back in March well admitted at [facility] and this was treated nonoperatively. He has improved and had a recent CT scan which showed some mild thickening posterior to the sigmoid colon but otherwise his infiltrate changes seem to have improved and he has been symptom free since then as well. Past Medical History  Diagnosis Date  . Unspecified essential hypertension   . Other and unspecified hyperlipidemia   . Obesity, unspecified   . Esophageal reflux   . Nocturia   . Flushing   . Other malaise and fatigue   . Type II or unspecified type diabetes mellitus without mention of complication, not stated as uncontrolled   . Allergic rhinitis, cause unspecified   . Other acquired absence of organ   . Psychosexual dysfunction with inhibited sexual excitement   . Irritable bowel syndrome   . Diverticulosis of colon (without mention of hemorrhage)   . Personal history of colonic polyps   . Coronary atherosclerosis of unspecified type of vessel, native or graft     1985 , arthroplasty x 1   . Myocardial infarction   . Depression     PTSD     Past Surgical History  Procedure Date  . Elbow bursa surgery   . Cholecystectomy   . Shoulder surgery     Rt. Shoulder  . Carpal tunnel release     left   . Coronary angioplasty     1985     Family History  Problem Relation Age of Onset  . Lung cancer Father   . Cancer Father     Lung  . Coronary artery disease Mother   . Colon cancer Neg Hx     Social  History History  Substance Use Topics  . Smoking status: Former Smoker    Quit date: 08/30/1983  . Smokeless tobacco: Former User  . Alcohol Use: Yes     rare    Allergies  Allergen Reactions  . Doxazosin Mesylate     Reaction=unknown  . Penicillins Rash  . Sulfonamide Derivatives Rash    Current Outpatient Prescriptions  Medication Sig Dispense Refill  . citalopram (CELEXA) 10 MG tablet Take 10 mg by mouth daily.      . aspirin 81 MG tablet Take 81 mg by mouth every morning.       . cholecalciferol (VITAMIN D) 1000 UNITS tablet Take 2,000 Units by mouth daily.      . cholestyramine (QUESTRAN) 4 G packet Take 1 packet by mouth 3 (three) times daily with meals.       . fexofenadine (ALLEGRA) 180 MG tablet Take 180 mg by mouth daily as needed. For allergies      . magnesium oxide (MAG-OX) 400 MG tablet Take 400 mg by mouth daily.       . Multiple Vitamin (MULTIVITAMIN) tablet Take 1 tablet by mouth daily.       . olmesartan (BENICAR) 20 MG tablet Take 20 mg by mouth every morning.      .   pantoprazole (PROTONIX) 40 MG tablet Take 40 mg by mouth every morning.       . pioglitazone (ACTOS) 15 MG tablet Take 15 mg by mouth every morning.       . simvastatin (ZOCOR) 20 MG tablet Take 20 mg by mouth at bedtime.       . Testosterone (ANDROGEL) 20.25 MG/1.25GM (1.62%) GEL Place onto the skin daily. 2 pumps daily        Review of Systems Review of Systems  Blood pressure 124/76, pulse 70, temperature 96.8 F (36 C), temperature source Temporal, resp. rate 16, height 5' 11" (1.803 m), weight 254 lb 6 oz (115.384 kg).  Physical Exam Physical Exam Physical Exam  Vitals reviewed. Constitutional: He is oriented to person, place, and time. He appears well-developed and well-nourished. No distress.  HENT:  Head: Normocephalic and atraumatic.  Mouth/Throat: No oropharyngeal exudate.  Eyes: Conjunctivae and EOM are normal. Pupils are equal, round, and reactive to light. Right eye exhibits  no discharge. Left eye exhibits no discharge. No scleral icterus.  Neck: Normal range of motion. No tracheal deviation present.  Cardiovascular: Normal rate, regular rhythm and normal heart sounds.   Pulmonary/Chest: Effort normal and breath sounds normal. No stridor. No respiratory distress. He has no wheezes. He has no rales. He exhibits no tenderness.  Abdominal: Soft. Bowel sounds are normal. He exhibits no distension and no mass. There is no tenderness. There is no rebound and no guarding.  Musculoskeletal: Normal range of motion. He exhibits no edema and no tenderness.  Neurological: He is alert and oriented to person, place, and time.  Skin: Skin is warm and dry. No rash noted. He is not diaphoretic. No erythema. No pallor.  Psychiatric: He has a normal mood and affect. His behavior is normal. Judgment and thought content normal.    Data Reviewed CT  Assessment    Recurrent diverticulitis Since he has had recurrent diverticulitis as well as the episode of complicated diverticulitis I discussed the options with him again of continued observation versus elective sigmoid colectomy and he is interested in sigmoid colectomy to minimize the chance of recurrence. I discussed with him the procedure and the perioperative management as well as the risks of the procedure. We discussed the risks of infection, bleeding, pain, scarring, hernia, and recurrence, injury to bowels or ureter, possible open, and anastomotic leakage and the possible need for reoperation or ostomy and he expressed understanding and desires to proceed with elective sigmoid colectomy.    Plan    We will set him up for laparoscopic sigmoid colectomy possible open procedure.       Chen Holzman DAVID 03/23/2012, 1:39 PM    

## 2012-03-28 NOTE — Anesthesia Preprocedure Evaluation (Signed)
Anesthesia Evaluation  Patient identified by MRN, date of birth, ID band Patient awake    Reviewed: Allergy & Precautions, H&P , NPO status , Patient's Chart, lab work & pertinent test results, reviewed documented beta blocker date and time   Airway Mallampati: II TM Distance: >3 FB Neck ROM: Full    Dental  (+) Teeth Intact and Dental Advisory Given   Pulmonary neg pulmonary ROS,  breath sounds clear to auscultation        Cardiovascular hypertension, Pt. on medications + CAD and + Past MI Rhythm:Regular Rate:Normal  Angioplasty 1985 Currently asymptomatic   Neuro/Psych negative neurological ROS  negative psych ROS   GI/Hepatic Neg liver ROS, Diverticulosis   Endo/Other  Well Controlled, Type 2, Oral Hypoglycemic Agents  Renal/GU negative Renal ROS  negative genitourinary   Musculoskeletal negative musculoskeletal ROS (+)   Abdominal   Peds negative pediatric ROS (+)  Hematology negative hematology ROS (+)   Anesthesia Other Findings Upper front bridge  Reproductive/Obstetrics negative OB ROS                           Anesthesia Physical Anesthesia Plan  ASA: III  Anesthesia Plan: General   Post-op Pain Management:    Induction: Intravenous  Airway Management Planned: Oral ETT  Additional Equipment:   Intra-op Plan:   Post-operative Plan: Extubation in OR  Informed Consent: I have reviewed the patients History and Physical, chart, labs and discussed the procedure including the risks, benefits and alternatives for the proposed anesthesia with the patient or authorized representative who has indicated his/her understanding and acceptance.   Dental advisory given  Plan Discussed with: CRNA and Surgeon  Anesthesia Plan Comments:         Anesthesia Quick Evaluation

## 2012-03-28 NOTE — Anesthesia Postprocedure Evaluation (Signed)
  Anesthesia Post-op Note  Patient: Cameron Daniel  Procedure(s) Performed: Procedure(s) (LRB): LAPAROSCOPIC SIGMOID COLECTOMY (N/A)  Patient Location: PACU  Anesthesia Type: General  Level of Consciousness: oriented and sedated  Airway and Oxygen Therapy: Patient Spontanous Breathing and Patient connected to nasal cannula oxygen  Post-op Pain: mild  Post-op Assessment: Post-op Vital signs reviewed, Patient's Cardiovascular Status Stable, Respiratory Function Stable and Patent Airway  Post-op Vital Signs: stable  Complications: No apparent anesthesia complications

## 2012-03-29 LAB — BASIC METABOLIC PANEL
BUN: 6 mg/dL (ref 6–23)
Chloride: 97 mEq/L (ref 96–112)
GFR calc Af Amer: >90 mL/min (ref >90)
Potassium: 3.6 mEq/L (ref 3.5–5.1)

## 2012-03-29 LAB — CBC
HCT: 41.3 % (ref 39.0–52.0)
Hemoglobin: 13.8 g/dL (ref 13.0–17.0)
MCHC: 33.4 g/dL (ref 30.0–36.0)
RBC: 4.68 MIL/uL (ref 4.22–5.81)

## 2012-03-29 LAB — GLUCOSE, CAPILLARY
Glucose-Capillary: 126 mg/dL — ABNORMAL HIGH (ref 70–99)
Glucose-Capillary: 127 mg/dL — ABNORMAL HIGH (ref 70–99)
Glucose-Capillary: 131 mg/dL — ABNORMAL HIGH (ref 70–99)
Glucose-Capillary: 151 mg/dL — ABNORMAL HIGH (ref 70–99)
Glucose-Capillary: 159 mg/dL — ABNORMAL HIGH (ref 70–99)
Glucose-Capillary: 172 mg/dL — ABNORMAL HIGH (ref 70–99)

## 2012-03-29 MED ORDER — POTASSIUM CHLORIDE IN NACL 20-0.9 MEQ/L-% IV SOLN
INTRAVENOUS | Status: DC
  Administered 2012-03-29 – 2012-04-03 (×9): via INTRAVENOUS
  Filled 2012-03-29 (×13): qty 1000

## 2012-03-29 MED ORDER — PANTOPRAZOLE SODIUM 40 MG IV SOLR
40.0000 mg | Freq: Every day | INTRAVENOUS | Status: DC
  Administered 2012-03-29 – 2012-04-04 (×7): 40 mg via INTRAVENOUS
  Filled 2012-03-29 (×7): qty 40

## 2012-03-29 NOTE — Op Note (Signed)
NAMEMarland Kitchen  OLUWASEUN, CREMER NO.:  [id number]  MEDICAL RECORD NO.:  [id number]  LOCATION:  1238                         FACILITY:  [facility]  PHYSICIAN:  [name], MD       DATE OF BIRTH:  1945-10-15  DATE OF PROCEDURE:  03/28/2012 DATE OF DISCHARGE:                              OPERATIVE REPORT   PROCEDURE:  Laparoscopic-assisted sigmoid colectomy with mobilization of the splenic flexure.  PREOPERATIVE DIAGNOSIS:  Diverticulitis.  POSTOPERATIVE DIAGNOSIS:  Diverticulitis.  SURGEON:  [name], MD  ASSISTANT:  Dr. Daphine Deutscher.  ANESTHESIA:  General endotracheal anesthesia.  FLUIDS:  2500 mL of crystalloid with 1 L of extend.  ESTIMATED BLOOD LOSS:  300 mL.  URINE OUTPUT:  180 mL.  DRAINS:  A 19-French Blake drains placed near the anastomosis and exited through the left lower quadrant.  SPECIMENS:  Sigmoid colon with the suture marking in distal margin sent to pathology for permanent section.  COMPLICATIONS:  None apparent.  FINDINGS:  Thickened sigmoid colon with evidence of inflammatory changes from prior diverticulitis.  No evidence for residual purulent material. Very fatty colon and fatty mesentry with limited visualization laparoscopically and mobilization of the splenic flexure.  INDICATION FOR PROCEDURE:  Mr. Orrego is a 66 year old male with several episodes of recurrent diverticulitis, which has been treated medically in March of this year.  He had an episode of complicated diverticulitis with abscess, which was treated with percutaneous drainage at [facility].  We recommended elective sigmoid colectomy to minimize chance of recurrent diverticulitis.  OPERATIVE DETAILS:  Mr. Kothari was seen and evaluated in the preoperative area and risks and benefits of the procedure were again discussed in lay terms.  Informed consent was obtained.  He had been on clear liquid diet and received enemas for bowel prep.  He was given prophylactic  antibiotics and a subcutaneous heparin and taken to the operating room, placed on table in a supine position, and general endotracheal anesthesia was obtained.  His right arm was tucked and Foley catheter was placed, and his legs were placed in the Yellofin stirrups and his abdomen and perineum were prepped and draped in the standard surgical fashion.  Procedure time-out was performed with all operative team members to confirm proper patient and procedure, and supraumbilical midline incision was made in the skin and dissection carried down through the subcutaneous tissue using blunt dissection. The abdominal wall fascia was elevated and sharply incised, and the peritoneum entered bluntly.  A 12 mm balloon trocar was placed at the umbilicus and pneumoperitoneum was obtained.  Laparoscope was introduced and there was no evidence of bowel injury upon entry.  He had some filmy adhesions in the pelvis consistent with his prior infection and diverticulitis.  I placed a 12 mm right lower quadrant trocar and 12 mm suprapubic trocar under direct visualization.  I positioned him in Trendelenburg with left side up, and attempted to position him for laparoscopic sigmoid colectomy in my usual fashion from a medial to lateral approach.  However, the intra-abdominal contents were so fatty with very large fatty epiploica appendages, that even though I was able to retract most of the small bowel in the right upper quadrant.  I still had a very difficult time visualizing the aorta and the vascular structures, and difficult time retracting the sigmoid colon due to the very large and fatty intra-abdominal contents.  So, I decided to place another 5 mm left lower quadrant trocar and proceeded with a lateral to medial approach mobilizing the white line of Toldt, starting from the area of disease which was noted to be at the pelvic brim at the iliac vessels.  Using mostly scissors and cautery along the white  line of Toldt laterally, I mobilized the descending colon.  As I approached more proximal in the colon, I used EnSeal bipolar cautery device to mobilize the fat and lateral attachments in the splenic flexure with the EnSeal device.  I carried this up around the splenic flexure and on to the transverse colon mobilizing the greater omentum from the transverse colon.  The mesenteric colon was very fatty as well, and because of the inflammatory changes from his previous diverticulitis, it was difficult to visualize a good working plane as well.  Most of the dissection was done bluntly.  I was not able to visualize the ureter, so I stayed very high up on the colon for further mobilization and did not divide anything unless it was right up on the colon.  I divided the lateral attachment at the pelvic brim as well, and this was the area of the obvious disease where he had inflammatory changes in the colon and thickening of the colon as well.  After I had mobilized the lateral aspects of the colon and the splenic flexure, we decided to proceed with the remainder of the dissection in the open fashion.  He was taken out of Trendelenburg and is in lateral position.  I opened his abdomen in the lower midline and dissection carried down into the peritoneum.  A Bookwalter retractor was placed for self retraction and the sigmoid colon was elevated into the wound.  I packed the small bowel into the right upper quadrant, and we created a window in the medial colon just proximal to the diseased portion of the colon.  It was actually difficult to visualize the diverticular because he had such fatty colon as well, so it was difficult to identify the proximal and distal extent of the resection.  So, we decided to divide this proximal enough to encompass the gross disease.  After the window was created through the mesocolon, a Green contour stapler was used to divide the bowel proximally.  Then using the  EnSeal cautery device, I divided the mesocolon high up near the bowel wall of the sigmoid colon as to avoid injury to the ureter, which was not well visualized.  Any areas of bleeding or obvious vascular pedicles were controlled with 2-0 silk stick ties, and the remainder of the mesentery was divided with the EnSeal device.  Carried this down towards the pelvis to what appeared to be the area of the rectosigmoid junction, and the bowel wall was cleared of the fat and another Green contour staple load was used to divide the young colon at the rectosigmoid junction.  A silk suture was placed on the distal margin and this was sent to pathology for permanent section. The abdomen was then irrigated with sterile saline solution and noted to be hemostatic.  After we inspected the dissected area for hemostasis and we felt that hemostasis was obtained, we decided to perform a end-to-end sutured anastomosis.  The proximal and approached distal end was without tension and the  posterior wall of 3-0 silk interrupted Lembert sutures was used to perform the approximate the back wall of the colon to the rectum and after the back wall sutures were placed, each of the staple lines were removed with the scissors.  Then, inner layer of the anastomosis was created with a 3-0 running PDS x2 for the back wall and this was transitioned to a Connell stitch for the anterior wall.  The sutures were secured and tied again on the anterior wall, and a 2nd layer of was 3-0 silk Lembert sutures were used to oversew the inner layer.  Then, Dr. Daphine Deutscher performed sigmoidoscopy while I had the bowel clamp proximally with my hand.  He blew this up with air, actually very tightly while it was submerged under water and there was no evidence of air leakage.  The air was removed from the rectum and since he had such large fatty epiploica appendages, these were sutured over the anastomosis as well with interrupted 3-0 and 2-0 silk  sutures as additional fatty patch over the anastomosis.  The abdomen was then again irrigated with sterile saline solution, and hemostasis was noted to be adequate.  All of the lap sponges were removed from the abdomen and a 19- [place] Blake drain was placed through the left lower quadrant trocar site and left in place just lateral to the anastomosis and anterior to the anastomosis in the pelvis to drain any excess fluid.  Then, the fascia was approximated with #1 looped PDS x2 taking care to avoid injury to underlying bowel contents, and the fascia was well approximated.  We exchanged the gloves prior to fascial closure.  The subcutaneous tissue was irrigated and with sterile saline solution and the wound was noted to be hemostatic.  The skin edges were then loosely approximated with skin staples and the wound was packed intermittently with quarter-inch iodoform gauze, and sterile dressing was applied.  All sponge, needle, and instrument counts were correct at the end of the case and the patient tolerated procedure well without apparent complication.  Foley catheter was left in place and he was transferred to the recovery room in stable condition.          ______________________________ [name], MD     BL/MEDQ  D:  03/28/2012  T:  03/29/2012  Job:  [id number]

## 2012-03-29 NOTE — Progress Notes (Signed)
1 Day Post-Op  Subjective: Burping, no flatus.  Pain controlled.    Objective: Vital signs in last 24 hours: Temp:  [98 F (36.7 C)-99.1 F (37.3 C)] 98.9 F (37.2 C) (08/01 0400) Pulse Rate:  [94-114] 95  (08/01 0345) Resp:  [12-28] 24  (08/01 0345) BP: (104-137)/(70-92) 126/75 mmHg (08/01 0345) SpO2:  [91 %-100 %] 95 % (08/01 0345) FiO2 (%):  [21 %] 21 % (08/01 0344) Weight:  [253 lb 1.4 oz (114.8 kg)-253 lb 12.8 oz (115.123 kg)] 253 lb 1.4 oz (114.8 kg) (08/01 0000) Last BM Date: 03/28/12  Intake/Output from previous day: 07/31 0701 - 08/01 0700 In: 5545 [P.O.:50; I.V.:4195; IV Piggyback:1300] Out: 2620 [Urine:2230; Drains:90; Blood:300] Intake/Output this shift:    General appearance: alert, cooperative and no distress Resp: nonlabored Cardio: normal rate, regular rhythm GI: soft, appropriate tenderness, ND, dressing intact, JP with appropriate ss output  Lab Results:   Basename 03/29/12 0315 03/28/12 1558  WBC 10.3 9.9  HGB 13.8 13.6  HCT 41.3 40.1  PLT 168 149*   BMET  Basename 03/29/12 0315  NA 133*  K 3.6  CL 97  CO2 28  GLUCOSE 139*  BUN 6  CREATININE 0.93  CALCIUM 8.6   PT/INR No results found for this basename: LABPROT:2,INR:2 in the last 72 hours ABG No results found for this basename: PHART:2,PCO2:2,PO2:2,HCO3:2 in the last 72 hours  Studies/Results: No results found.  Anti-infectives: Anti-infectives     Start     Dose/Rate Route Frequency Ordered Stop   03/28/12 2100   ciprofloxacin (CIPRO) IVPB 400 mg        400 mg 200 mL/hr over 60 Minutes Intravenous Every 12 hours 03/28/12 1512 03/28/12 2259   03/28/12 1600   metroNIDAZOLE (FLAGYL) IVPB 500 mg        500 mg 100 mL/hr over 60 Minutes Intravenous Every 8 hours 03/28/12 1512 03/29/12 0138   03/28/12 0830   ciprofloxacin (CIPRO) IVPB 400 mg        400 mg 200 mL/hr over 60 Minutes Intravenous  Once 03/28/12 0829 03/28/12 0905   03/28/12 0634   metroNIDAZOLE (FLAGYL) IVPB 500 mg          500 mg 100 mL/hr over 60 Minutes Intravenous 60 min pre-op 03/28/12 0634 03/28/12 0838          Assessment/Plan: s/p Procedure(s) (LRB): LAPAROSCOPIC SIGMOID COLECTOMY (N/A) remove foley, ice chips, mobilize today.  awaiting return of bowel function.  will change dressing and wicks tomorrow  LOS: 1 day    [name] DAVID 03/29/2012

## 2012-03-29 NOTE — Progress Notes (Signed)
CARE MANAGEMENT NOTE 03/29/2012  Patient:  CARNELL, BEAVERS   Account Number:  [id number]  Date Initiated:  03/29/2012  Documentation initiated by:  DAVIS,RHONDA  Subjective/Objective Assessment:   patient with cardiac history and hypotensive episodes after surgery, colon resexction and post op cardiac monitoring     Action/Plan:   lives at home   Anticipated DC Date:  04/01/2012   Anticipated DC Plan:  HOME/SELF CARE  In-house referral  NA      DC Planning Services  NA      [facility] Choice  NA   Choice offered to / List presented to:  NA   DME arranged  NA      DME agency  NA     HH arranged  NA      HH agency  NA   Status of service:  In process, will continue to follow Medicare Important Message given?  NA - LOS <3 / Initial given by admissions (If response is "NO", the following Medicare IM given date fields will be blank) Date Medicare IM given:   Date Additional Medicare IM given:    Discharge Disposition:    Per UR Regulation:  Reviewed for med. necessity/level of care/duration of stay  If discussed at Long Length of Stay Meetings, dates discussed:    Comments:  [id number]/Rhonda Earlene Plater, RN, BSN, CCM: CHART REVIEWED AND UPDATED. NO DISCHARGE NEEDS PRESENT AT THIS TIME. CASE MANAGEMENT [phone]

## 2012-03-30 LAB — CBC WITH DIFFERENTIAL/PLATELET
Basophils Absolute: 0 10*3/uL (ref 0.0–0.1)
Basophils Relative: 0 % (ref 0–1)
Lymphocytes Relative: 11 % — ABNORMAL LOW (ref 12–46)
MCHC: 34.1 g/dL (ref 30.0–36.0)
Neutro Abs: 9.1 10*3/uL — ABNORMAL HIGH (ref 1.7–7.7)
Neutrophils Relative %: 79 % — ABNORMAL HIGH (ref 43–77)
Platelets: 149 10*3/uL — ABNORMAL LOW (ref 150–400)
RDW: 14.2 % (ref 11.5–15.5)
WBC: 11.5 10*3/uL — ABNORMAL HIGH (ref 4.0–10.5)

## 2012-03-30 LAB — GLUCOSE, CAPILLARY
Glucose-Capillary: 113 mg/dL — ABNORMAL HIGH (ref 70–99)
Glucose-Capillary: 135 mg/dL — ABNORMAL HIGH (ref 70–99)
Glucose-Capillary: 150 mg/dL — ABNORMAL HIGH (ref 70–99)
Glucose-Capillary: 166 mg/dL — ABNORMAL HIGH (ref 70–99)

## 2012-03-30 LAB — BASIC METABOLIC PANEL
BUN: 6 mg/dL (ref 6–23)
Chloride: 94 mEq/L — ABNORMAL LOW (ref 96–112)
GFR calc Af Amer: >90 mL/min (ref >90)
Potassium: 4.1 mEq/L (ref 3.5–5.1)
Sodium: 130 mEq/L — ABNORMAL LOW (ref 135–145)

## 2012-03-30 NOTE — Progress Notes (Signed)
2 Days Post-Op  Subjective: Urinating frequently throughout the night.  Good uop.  Pain improved compared to yesterday.  Still no flatus or BM  Objective: Vital signs in last 24 hours: Temp:  [97.9 F (36.6 C)-98.9 F (37.2 C)] 98.1 F (36.7 C) (08/02 0411) Pulse Rate:  [91-104] 99  (08/02 0411) Resp:  [16-30] 22  (08/02 0418) BP: (119-152)/(70-87) 148/87 mmHg (08/02 0411) SpO2:  [95 %-97 %] 96 % (08/02 0418) Weight:  [251 lb 8 oz (114.08 kg)] 251 lb 8 oz (114.08 kg) (08/01 1500) Last BM Date: 03/28/12  Intake/Output from previous day: 08/01 0701 - 08/02 0700 In: 2704.6 [I.V.:2689.6] Out: 3375 [Urine:3310; Drains:65] Intake/Output this shift:    General appearance: alert, cooperative and no distress Resp: clear to auscultation bilaterally Cardio: mild tachycardia HR 105, regular GI: soft, appropriate incisional and LLQ tenderness, mild distension, wound okay without infection, wicks in place, dressing changed.  no peritoneal signs  Lab Results:   Basename 03/30/12 0445 03/29/12 0315  WBC 11.5* 10.3  HGB 13.5 13.8  HCT 39.6 41.3  PLT 149* 168   BMET  Basename 03/30/12 0445 03/29/12 0315  NA 130* 133*  K 4.1 3.6  CL 94* 97  CO2 26 28  GLUCOSE 155* 139*  BUN 6 6  CREATININE 0.91 0.93  CALCIUM 9.0 8.6   PT/INR No results found for this basename: LABPROT:2,INR:2 in the last 72 hours ABG No results found for this basename: PHART:2,PCO2:2,PO2:2,HCO3:2 in the last 72 hours  Studies/Results: No results found.  Anti-infectives: Anti-infectives     Start     Dose/Rate Route Frequency Ordered Stop   03/28/12 2100   ciprofloxacin (CIPRO) IVPB 400 mg        400 mg 200 mL/hr over 60 Minutes Intravenous Every 12 hours 03/28/12 1512 03/28/12 2259   03/28/12 1600   metroNIDAZOLE (FLAGYL) IVPB 500 mg        500 mg 100 mL/hr over 60 Minutes Intravenous Every 8 hours 03/28/12 1512 03/29/12 0138   03/28/12 0830   ciprofloxacin (CIPRO) IVPB 400 mg        400 mg 200  mL/hr over 60 Minutes Intravenous  Once 03/28/12 0829 03/28/12 0905   03/28/12 0634   metroNIDAZOLE (FLAGYL) IVPB 500 mg        500 mg 100 mL/hr over 60 Minutes Intravenous 60 min pre-op 03/28/12 0634 03/28/12 0838          Assessment/Plan: s/p Procedure(s) (LRB): LAPAROSCOPIC SIGMOID COLECTOMY (N/A) decrease fluids.  awaiting return of bowel function.  ambulate  LOS: 2 days    Cameron Daniel DAVID 03/30/2012

## 2012-03-30 NOTE — Progress Notes (Signed)
Pt with urinary frequency and urgency all night. Voiding up to 200cc every hour to 2 hours. Voiced having difficulty starting stream and must stand up to void.   Minervia Osso, Ok Edwards RN

## 2012-03-30 NOTE — Progress Notes (Signed)
He is doing okay.  Still no bowel function but has active bowel sounds.  HD stable.  Exam appropriate.

## 2012-03-31 LAB — GLUCOSE, CAPILLARY: Glucose-Capillary: 140 mg/dL — ABNORMAL HIGH (ref 70–99)

## 2012-03-31 LAB — BASIC METABOLIC PANEL
Calcium: 9.4 mg/dL (ref 8.4–10.5)
Creatinine, Ser: 0.79 mg/dL (ref 0.50–1.35)
GFR calc Af Amer: >90 mL/min (ref >90)

## 2012-03-31 LAB — CBC WITH DIFFERENTIAL/PLATELET
Eosinophils Absolute: 0.3 10*3/uL (ref 0.0–0.7)
Eosinophils Relative: 3 % (ref 0–5)
Lymphs Abs: 1.6 10*3/uL (ref 0.7–4.0)
MCH: 29.5 pg (ref 26.0–34.0)
MCV: 88.6 fL (ref 78.0–100.0)
Monocytes Absolute: 0.9 10*3/uL (ref 0.1–1.0)
Platelets: 152 10*3/uL (ref 150–400)
RBC: 4.58 MIL/uL (ref 4.22–5.81)
RDW: 14.2 % (ref 11.5–15.5)

## 2012-03-31 NOTE — Progress Notes (Signed)
3 Days Post-Op  Subjective: Thinks that he may have passed some gas.  No nausea.  Pain controlled   Objective: Vital signs in last 24 hours: Temp:  [97.4 F (36.3 C)-98.4 F (36.9 C)] 97.4 F (36.3 C) (08/03 0435) Pulse Rate:  [89-102] 89  (08/03 0435) Resp:  [16-26] 19  (08/03 0435) BP: (130-134)/(72-85) 132/72 mmHg (08/03 0435) SpO2:  [97 %-98 %] 98 % (08/03 0435) Last BM Date: 03/28/12  Intake/Output from previous day: 08/02 0701 - 08/03 0700 In: 1652.9 [I.V.:1652.9] Out: 3045 [Urine:2975; Drains:70] Intake/Output this shift:    General appearance: alert, cooperative and no distress Resp: nonlabored Cardio: normal rate, regular GI: soft, appropriate incisional tenderness, wicks removed, slight erythema, JP serous,  active bowel sounds  Lab Results:   Basename 03/31/12 0437 03/30/12 0445  WBC 10.4 11.5*  HGB 13.5 13.5  HCT 40.6 39.6  PLT 152 149*   BMET  Basename 03/31/12 0437 03/30/12 0445  NA 132* 130*  K 4.1 4.1  CL 96 94*  CO2 27 26  GLUCOSE 123* 155*  BUN 8 6  CREATININE 0.79 0.91  CALCIUM 9.4 9.0   PT/INR No results found for this basename: LABPROT:2,INR:2 in the last 72 hours ABG No results found for this basename: PHART:2,PCO2:2,PO2:2,HCO3:2 in the last 72 hours  Studies/Results: No results found.  Anti-infectives: Anti-infectives     Start     Dose/Rate Route Frequency Ordered Stop   03/28/12 2100   ciprofloxacin (CIPRO) IVPB 400 mg        400 mg 200 mL/hr over 60 Minutes Intravenous Every 12 hours 03/28/12 1512 03/28/12 2259   03/28/12 1600   metroNIDAZOLE (FLAGYL) IVPB 500 mg        500 mg 100 mL/hr over 60 Minutes Intravenous Every 8 hours 03/28/12 1512 03/29/12 0138   03/28/12 0830   ciprofloxacin (CIPRO) IVPB 400 mg        400 mg 200 mL/hr over 60 Minutes Intravenous  Once 03/28/12 0829 03/28/12 0905   03/28/12 0634   metroNIDAZOLE (FLAGYL) IVPB 500 mg        500 mg 100 mL/hr over 60 Minutes Intravenous 60 min pre-op 03/28/12  0634 03/28/12 0838          Assessment/Plan: s/p Procedure(s) (LRB): LAPAROSCOPIC SIGMOID COLECTOMY (N/A) he has good bowel sounds, will try sips of clears.  wound slightly erythematous we will watch this for now.  otherwise HR has improved, wbc normal and seems to be doing okay.  LOS: 3 days    [name] DAVID 03/31/2012

## 2012-04-01 ENCOUNTER — Inpatient Hospital Stay (HOSPITAL_COMMUNITY): Payer: Medicare Other

## 2012-04-01 LAB — GLUCOSE, CAPILLARY
Glucose-Capillary: 144 mg/dL — ABNORMAL HIGH (ref 70–99)
Glucose-Capillary: 93 mg/dL (ref 70–99)
Glucose-Capillary: 132 mg/dL — ABNORMAL HIGH (ref 70–99)

## 2012-04-01 NOTE — Progress Notes (Signed)
4 Days Post-Op  Subjective: Still no flatus.  Tolerated liquids without nausea but did have some bloating last night.  Objective: Vital signs in last 24 hours: Temp:  [97.9 F (36.6 C)-98.1 F (36.7 C)] 97.9 F (36.6 C) (08/04 0541) Pulse Rate:  [82-90] 90  (08/04 0541) Resp:  [18-24] 18  (08/04 0541) BP: (114-147)/(75-81) 114/75 mmHg (08/04 0541) SpO2:  [94 %-99 %] 98 % (08/04 0541) Last BM Date: 03/28/12  Intake/Output from previous day: 08/03 0701 - 08/04 0700 In: 1200 [P.O.:300; I.V.:900] Out: 2015 [Urine:1950; Drains:65] Intake/Output this shift:    General appearance: alert, cooperative and no distress Resp: clear to auscultation bilaterally Cardio: normal rate, regular GI: soft, NT, mild distension and some upper abdominal tympany, no peritoneal signs. wound okay. JP with serous output Extremities: SCD's  Lab Results:   Basename 03/31/12 0437 03/30/12 0445  WBC 10.4 11.5*  HGB 13.5 13.5  HCT 40.6 39.6  PLT 152 149*   BMET  Basename 03/31/12 0437 03/30/12 0445  NA 132* 130*  K 4.1 4.1  CL 96 94*  CO2 27 26  GLUCOSE 123* 155*  BUN 8 6  CREATININE 0.79 0.91  CALCIUM 9.4 9.0   PT/INR No results found for this basename: LABPROT:2,INR:2 in the last 72 hours ABG No results found for this basename: PHART:2,PCO2:2,PO2:2,HCO3:2 in the last 72 hours  Studies/Results: No results found.  Anti-infectives: Anti-infectives     Start     Dose/Rate Route Frequency Ordered Stop   03/28/12 2100   ciprofloxacin (CIPRO) IVPB 400 mg        400 mg 200 mL/hr over 60 Minutes Intravenous Every 12 hours 03/28/12 1512 03/28/12 2259   03/28/12 1600   metroNIDAZOLE (FLAGYL) IVPB 500 mg        500 mg 100 mL/hr over 60 Minutes Intravenous Every 8 hours 03/28/12 1512 03/29/12 0138   03/28/12 0830   ciprofloxacin (CIPRO) IVPB 400 mg        400 mg 200 mL/hr over 60 Minutes Intravenous  Once 03/28/12 0829 03/28/12 0905   03/28/12 0634   metroNIDAZOLE (FLAGYL) IVPB 500 mg          500 mg 100 mL/hr over 60 Minutes Intravenous 60 min pre-op 03/28/12 0634 03/28/12 0838          Assessment/Plan: s/p Procedure(s) (LRB): LAPAROSCOPIC SIGMOID COLECTOMY (N/A) He has good bowel sounds but still no flatus or BM.  He has some bloating so we will check abdominal xrays for this.  will leave on clears until we have some bowel function.  otherwise HR has come down and is normal.  abdominal exam otherwise okay.  LOS: 4 days    [name] DAVID 04/01/2012

## 2012-04-01 NOTE — Progress Notes (Signed)
Still no flatus or BM.  Feels his bowels grumbling.  HD stable.  xrays with some colonic gas.  Nonspecific pattern.

## 2012-04-02 LAB — GLUCOSE, CAPILLARY
Glucose-Capillary: 127 mg/dL — ABNORMAL HIGH (ref 70–99)
Glucose-Capillary: 129 mg/dL — ABNORMAL HIGH (ref 70–99)
Glucose-Capillary: 109 mg/dL — ABNORMAL HIGH (ref 70–99)

## 2012-04-02 MED ORDER — DOCUSATE SODIUM 100 MG PO CAPS
100.0000 mg | ORAL_CAPSULE | Freq: Two times a day (BID) | ORAL | Status: DC
  Administered 2012-04-02 – 2012-04-07 (×9): 100 mg via ORAL
  Filled 2012-04-02 (×13): qty 1

## 2012-04-02 MED ORDER — BISACODYL 5 MG PO TBEC
5.0000 mg | DELAYED_RELEASE_TABLET | Freq: Once | ORAL | Status: AC
  Administered 2012-04-02: 5 mg via ORAL
  Filled 2012-04-02: qty 1

## 2012-04-02 NOTE — Progress Notes (Signed)
5 Days Post-Op  Subjective: Feeling well.  He has passed flatus several times throughout the night and this morning.  He rested well and "had a good night"  Objective: Vital signs in last 24 hours: Temp:  [97.3 F (36.3 C)-98.6 F (37 C)] 98.6 F (37 C) 2023-04-07 2056) Pulse Rate:  [89-96] 89  04/07/2023 2056) Resp:  [16-22] 18  (08/05 0400) BP: (129-131)/(76-81) 131/76 mmHg 2023/04/07 2056) SpO2:  [97 %-99 %] 98 % (08/05 0400) Last BM Date: 03/28/12  Intake/Output from previous day: 2023/04/07 0701 - 08/05 0700 In: 2120 [P.O.:420; I.V.:1650] Out: 1375 [Urine:1350; Drains:25] Intake/Output this shift: Total I/O In: 1080 [P.O.:180; I.V.:900] Out: 1075 [Urine:1050; Drains:25]  General appearance: alert, cooperative and no distress Resp: clear to auscultation bilaterally Cardio: normal rate (now 80's), regular GI: soft, nontender to my exam, distension improved, wound without infection, dressing changed, jp serous output Extremities: SCD's bilat  Lab Results:   [facility] 03/31/12 0437  WBC 10.4  HGB 13.5  HCT 40.6  PLT 152   BMET  Basename 03/31/12 0437  NA 132*  K 4.1  CL 96  CO2 27  GLUCOSE 123*  BUN 8  CREATININE 0.79  CALCIUM 9.4   PT/INR No results found for this basename: LABPROT:2,INR:2 in the last 72 hours ABG No results found for this basename: PHART:2,PCO2:2,PO2:2,HCO3:2 in the last 72 hours  Studies/Results: Dg Abd 2 Views  06-Apr-2012  *RADIOLOGY REPORT*  Clinical Data: Status post sigmoidectomy.  Bloating and abdominal pain.  ABDOMEN - 2 VIEW  Comparison: CT of the abdomen and pelvis 02/23/2012.  Findings: There is some gas scattered throughout the colon extending to the rectum.  There are a few minimally dilated loops of gas-filled small bowel measuring up to 3.3 cm in diameter in the left lower quadrant of the abdomen.  No gross evidence of pneumoperitoneum.  Midline skin staples and to skin staples projecting over the right lower pelvis.  Multiple pelvic  phleboliths.  Surgical clips projecting over the right upper quadrant of the abdomen, consistent with prior cholecystectomy. A single surgical drain is seen extending into the pelvis.  IMPRESSION: 1.  Nonspecific bowel gas pattern, favored to represent any mild ileus, as above. 2.  No pneumoperitoneum. 3.  Postoperative changes and support apparatus, as above.  Original Report Authenticated By: Florencia Reasons, M.D.    Anti-infectives: Anti-infectives     Start     Dose/Rate Route Frequency Ordered Stop   03/28/12 2100   ciprofloxacin (CIPRO) IVPB 400 mg        400 mg 200 mL/hr over 60 Minutes Intravenous Every 12 hours 03/28/12 1512 03/28/12 2259   03/28/12 1600   metroNIDAZOLE (FLAGYL) IVPB 500 mg        500 mg 100 mL/hr over 60 Minutes Intravenous Every 8 hours 03/28/12 1512 03/29/12 0138   03/28/12 0830   ciprofloxacin (CIPRO) IVPB 400 mg        400 mg 200 mL/hr over 60 Minutes Intravenous  Once 03/28/12 0829 03/28/12 0905   03/28/12 0634   metroNIDAZOLE (FLAGYL) IVPB 500 mg        500 mg 100 mL/hr over 60 Minutes Intravenous 60 min pre-op 03/28/12 0634 03/28/12 0838          Assessment/Plan: s/p Procedure(s) (LRB): LAPAROSCOPIC SIGMOID COLECTOMY (N/A) Advance diet He is passing flatus and continues to feel better.  will advance diet as tolerated  LOS: 5 days    Cameron Daniel DAVID 04/02/2012

## 2012-04-03 LAB — GLUCOSE, CAPILLARY
Glucose-Capillary: 124 mg/dL — ABNORMAL HIGH (ref 70–99)
Glucose-Capillary: 159 mg/dL — ABNORMAL HIGH (ref 70–99)

## 2012-04-03 MED ORDER — CHOLESTYRAMINE 4 G PO PACK
4.0000 g | PACK | Freq: Three times a day (TID) | ORAL | Status: DC
  Administered 2012-04-03 – 2012-04-07 (×6): 4 g via ORAL
  Filled 2012-04-03 (×14): qty 1

## 2012-04-03 MED ORDER — ENSURE PUDDING PO PUDG
1.0000 | Freq: Three times a day (TID) | ORAL | Status: DC
  Administered 2012-04-03 – 2012-04-07 (×6): 1 via ORAL
  Filled 2012-04-03 (×16): qty 1

## 2012-04-03 MED ORDER — MORPHINE SULFATE 2 MG/ML IJ SOLN: 1.0000 mg | INTRAMUSCULAR | Status: DC | PRN

## 2012-04-03 MED ORDER — OXYCODONE-ACETAMINOPHEN 5-325 MG PO TABS
1.0000 | ORAL_TABLET | ORAL | Status: DC | PRN
  Administered 2012-04-03 – 2012-04-07 (×7): 1 via ORAL
  Filled 2012-04-03 (×7): qty 1

## 2012-04-03 MED ORDER — CHOLESTYRAMINE 4 G PO PACK
4.0000 g | PACK | ORAL | Status: DC
  Administered 2012-04-03: 4 g via ORAL
  Filled 2012-04-03: qty 1

## 2012-04-03 NOTE — Progress Notes (Signed)
Doing well.  Tolerating full liquids. Bowels loose now.  Abdomen appropriate.  Vitals okay.  Will advance diet as tolerated.

## 2012-04-03 NOTE — Progress Notes (Signed)
6 Days Post-Op  Subjective: No issues.  Tolerating clears.  BMx2   Objective: Vital signs in last 24 hours: Temp:  [97.5 F (36.4 C)-97.9 F (36.6 C)] 97.9 F (36.6 C) (08/06 0434) Pulse Rate:  [81-89] 81  (08/06 0434) Resp:  [8-20] 8  (08/06 0527) BP: (126-132)/(76-79) 126/76 mmHg (08/06 0434) SpO2:  [97 %-100 %] 98 % (08/06 0527) Last BM Date: 04/02/12  Intake/Output from previous day: 08/05 0701 - 08/06 0700 In: 2385 [P.O.:960; I.V.:1425] Out: 947 [Urine:900; Drains:47] Intake/Output this shift:    General appearance: alert, cooperative and no distress Resp: clear to auscultation bilaterally Cardio: HR 90, regular GI: soft, minimal tenderness, wound okay, jp ss, no peritoneal signs  Lab Results:  No results found for this basename: WBC:2,HGB:2,HCT:2,PLT:2 in the last 72 hours BMET No results found for this basename: NA:2,K:2,CL:2,CO2:2,GLUCOSE:2,BUN:2,CREATININE:2,CALCIUM:2 in the last 72 hours PT/INR No results found for this basename: LABPROT:2,INR:2 in the last 72 hours ABG No results found for this basename: PHART:2,PCO2:2,PO2:2,HCO3:2 in the last 72 hours  Studies/Results: Dg Abd 2 Views  04-20-2012  *RADIOLOGY REPORT*  Clinical Data: Status post sigmoidectomy.  Bloating and abdominal pain.  ABDOMEN - 2 VIEW  Comparison: CT of the abdomen and pelvis 02/23/2012.  Findings: There is some gas scattered throughout the colon extending to the rectum.  There are a few minimally dilated loops of gas-filled small bowel measuring up to 3.3 cm in diameter in the left lower quadrant of the abdomen.  No gross evidence of pneumoperitoneum.  Midline skin staples and to skin staples projecting over the right lower pelvis.  Multiple pelvic phleboliths.  Surgical clips projecting over the right upper quadrant of the abdomen, consistent with prior cholecystectomy. A single surgical drain is seen extending into the pelvis.  IMPRESSION: 1.  Nonspecific bowel gas pattern, favored to  represent any mild ileus, as above. 2.  No pneumoperitoneum. 3.  Postoperative changes and support apparatus, as above.  Original Report Authenticated By: Florencia Reasons, M.D.    Anti-infectives: Anti-infectives     Start     Dose/Rate Route Frequency Ordered Stop   03/28/12 2100   ciprofloxacin (CIPRO) IVPB 400 mg        400 mg 200 mL/hr over 60 Minutes Intravenous Every 12 hours 03/28/12 1512 03/28/12 2259   03/28/12 1600   metroNIDAZOLE (FLAGYL) IVPB 500 mg        500 mg 100 mL/hr over 60 Minutes Intravenous Every 8 hours 03/28/12 1512 03/29/12 0138   03/28/12 0830   ciprofloxacin (CIPRO) IVPB 400 mg        400 mg 200 mL/hr over 60 Minutes Intravenous  Once 03/28/12 0829 03/28/12 0905   03/28/12 0634   metroNIDAZOLE (FLAGYL) IVPB 500 mg        500 mg 100 mL/hr over 60 Minutes Intravenous 60 min pre-op 03/28/12 0634 03/28/12 0838          Assessment/Plan: s/p Procedure(s) (LRB): LAPAROSCOPIC SIGMOID COLECTOMY (N/A) Advance diet  LOS: 6 days    Cameron Daniel DAVID 04/03/2012

## 2012-04-04 LAB — GLUCOSE, CAPILLARY
Glucose-Capillary: 133 mg/dL — ABNORMAL HIGH (ref 70–99)
Glucose-Capillary: 119 mg/dL — ABNORMAL HIGH (ref 70–99)
Glucose-Capillary: 155 mg/dL — ABNORMAL HIGH (ref 70–99)

## 2012-04-04 MED ORDER — PANTOPRAZOLE SODIUM 40 MG PO TBEC
40.0000 mg | DELAYED_RELEASE_TABLET | Freq: Every day | ORAL | Status: DC
  Administered 2012-04-05 – 2012-04-07 (×3): 40 mg via ORAL
  Filled 2012-04-04 (×3): qty 1

## 2012-04-04 MED ORDER — ASPIRIN EC 81 MG PO TBEC
81.0000 mg | DELAYED_RELEASE_TABLET | Freq: Every day | ORAL | Status: DC
  Administered 2012-04-04 – 2012-04-07 (×4): 81 mg via ORAL
  Filled 2012-04-04 (×4): qty 1

## 2012-04-04 NOTE — Progress Notes (Signed)
7 Days Post-Op  Subjective: No issues had some regular foods last night.  Objective: Vital signs in last 24 hours: Temp:  [98 F (36.7 C)-98.6 F (37 C)] 98.3 F (36.8 C) (08/07 0422) Pulse Rate:  [83-95] 83  (08/07 0422) Resp:  [16-19] 19  (08/07 0422) BP: (118-132)/(72-82) 132/81 mmHg (08/07 0422) SpO2:  [96 %-98 %] 97 % (08/07 0422) Last BM Date: 04/03/12  Intake/Output from previous day: 08/06 0701 - 08/07 0700 In: 720 [P.O.:720] Out: 1410 [Urine:1375; Drains:35] Intake/Output this shift:    General appearance: alert, cooperative and no distress Resp: clear to auscultation bilaterally Cardio: normal rate, regular GI: abdomen is soft, NT, ND, JP removed, no peritoneal signs, no infection Extremities: extremities normal, atraumatic, no cyanosis or edema  Lab Results:  No results found for this basename: WBC:2,HGB:2,HCT:2,PLT:2 in the last 72 hours BMET No results found for this basename: NA:2,K:2,CL:2,CO2:2,GLUCOSE:2,BUN:2,CREATININE:2,CALCIUM:2 in the last 72 hours PT/INR No results found for this basename: LABPROT:2,INR:2 in the last 72 hours ABG No results found for this basename: PHART:2,PCO2:2,PO2:2,HCO3:2 in the last 72 hours  Studies/Results: No results found.  Anti-infectives: Anti-infectives     Start     Dose/Rate Route Frequency Ordered Stop   03/28/12 2100   ciprofloxacin (CIPRO) IVPB 400 mg        400 mg 200 mL/hr over 60 Minutes Intravenous Every 12 hours 03/28/12 1512 03/28/12 2259   03/28/12 1600   metroNIDAZOLE (FLAGYL) IVPB 500 mg        500 mg 100 mL/hr over 60 Minutes Intravenous Every 8 hours 03/28/12 1512 03/29/12 0138   03/28/12 0830   ciprofloxacin (CIPRO) IVPB 400 mg        400 mg 200 mL/hr over 60 Minutes Intravenous  Once 03/28/12 0829 03/28/12 0905   03/28/12 0634   metroNIDAZOLE (FLAGYL) IVPB 500 mg        500 mg 100 mL/hr over 60 Minutes Intravenous 60 min pre-op 03/28/12 0634 03/28/12 0838          Assessment/Plan: s/p  Procedure(s) (LRB): LAPAROSCOPIC SIGMOID COLECTOMY (N/A) Advance diet Plan for discharge tomorrow, looks good, and improving   LOS: 7 days    [name] DAVID 04/04/2012

## 2012-04-05 LAB — GLUCOSE, CAPILLARY
Glucose-Capillary: 131 mg/dL — ABNORMAL HIGH (ref 70–99)
Glucose-Capillary: 128 mg/dL — ABNORMAL HIGH (ref 70–99)
Glucose-Capillary: 126 mg/dL — ABNORMAL HIGH (ref 70–99)

## 2012-04-05 MED ORDER — INDOMETHACIN 50 MG PO CAPS
50.0000 mg | ORAL_CAPSULE | Freq: Three times a day (TID) | ORAL | Status: DC
  Administered 2012-04-05 – 2012-04-07 (×5): 50 mg via ORAL
  Filled 2012-04-05 (×9): qty 1

## 2012-04-05 MED ORDER — CLINDAMYCIN HCL 300 MG PO CAPS
300.0000 mg | ORAL_CAPSULE | Freq: Four times a day (QID) | ORAL | Status: DC
  Administered 2012-04-05 – 2012-04-07 (×10): 300 mg via ORAL
  Filled 2012-04-05 (×13): qty 1

## 2012-04-05 MED ORDER — COLCHICINE 0.6 MG PO TABS
1.2000 mg | ORAL_TABLET | Freq: Once | ORAL | Status: AC
  Administered 2012-04-05: 1.2 mg via ORAL
  Filled 2012-04-05: qty 2

## 2012-04-05 NOTE — Progress Notes (Signed)
8 Days Post-Op  Subjective: Tolerating diet.  Main complaint is right foot pain  Objective: Vital signs in last 24 hours: Temp:  [98.1 F (36.7 C)-99.5 F (37.5 C)] 98.8 F (37.1 C) (08/08 0442) Pulse Rate:  [87-93] 87  (08/08 0442) Resp:  [18] 18  (08/08 0442) BP: (126-133)/(69-80) 132/80 mmHg (08/08 0442) SpO2:  [93 %-98 %] 93 % (08/08 0442) Last BM Date: 04/03/12  Intake/Output from previous day: 08/07 0701 - 08/08 0700 In: 600 [P.O.:600] Out: 2350 [Urine:2350] Intake/Output this shift:    General appearance: alert, cooperative and no distress Resp: clear to auscultation bilaterally Cardio: regular rate and rhythm, S1, S2 normal, no murmur, click, rub or gallop GI: soft, NT, ND, mild erythema at lower aspect of wound Extremities: right foot tenderness and swelling with mild erythema at DP region, tender to move great toe  Lab Results:  No results found for this basename: WBC:2,HGB:2,HCT:2,PLT:2 in the last 72 hours BMET No results found for this basename: NA:2,K:2,CL:2,CO2:2,GLUCOSE:2,BUN:2,CREATININE:2,CALCIUM:2 in the last 72 hours PT/INR No results found for this basename: LABPROT:2,INR:2 in the last 72 hours ABG No results found for this basename: PHART:2,PCO2:2,PO2:2,HCO3:2 in the last 72 hours  Studies/Results: No results found.  Anti-infectives: Anti-infectives     Start     Dose/Rate Route Frequency Ordered Stop   04/05/12 0715   clindamycin (CLEOCIN) capsule 300 mg        300 mg Oral 4 times per day 04/05/12 1610     03/28/12 2100   ciprofloxacin (CIPRO) IVPB 400 mg        400 mg 200 mL/hr over 60 Minutes Intravenous Every 12 hours 03/28/12 1512 03/28/12 2259   03/28/12 1600   metroNIDAZOLE (FLAGYL) IVPB 500 mg        500 mg 100 mL/hr over 60 Minutes Intravenous Every 8 hours 03/28/12 1512 03/29/12 0138   03/28/12 0830   ciprofloxacin (CIPRO) IVPB 400 mg        400 mg 200 mL/hr over 60 Minutes Intravenous  Once 03/28/12 0829 03/28/12 0905   03/28/12 0634   metroNIDAZOLE (FLAGYL) IVPB 500 mg        500 mg 100 mL/hr over 60 Minutes Intravenous 60 min pre-op 03/28/12 0634 03/28/12 0838          Assessment/Plan: s/p Procedure(s) (LRB): LAPAROSCOPIC SIGMOID COLECTOMY (N/A) I had planned for discharge today but he appears to be having some erythema at lower aspect of the wound, possible early wound infection? will start clindamycin and see how this looks later or tomorrow. I do not think that the wound needs opening today.  also appears to be having a gouty flare.  right foot swollen but calf nontender and no swelling in leg.  no evidence of DVT.  try colchicine  LOS: 8 days    [name] DAVID 04/05/2012

## 2012-04-06 DIAGNOSIS — I10 Essential (primary) hypertension: Secondary | ICD-10-CM

## 2012-04-06 DIAGNOSIS — M109 Gout, unspecified: Secondary | ICD-10-CM

## 2012-04-06 DIAGNOSIS — E119 Type 2 diabetes mellitus without complications: Secondary | ICD-10-CM

## 2012-04-06 LAB — GLUCOSE, CAPILLARY
Glucose-Capillary: 130 mg/dL — ABNORMAL HIGH (ref 70–99)
Glucose-Capillary: 177 mg/dL — ABNORMAL HIGH (ref 70–99)

## 2012-04-06 MED ORDER — SODIUM CHLORIDE 0.9 % IV SOLN: 1.0000 g | Freq: Once | INTRAVENOUS | Status: DC

## 2012-04-06 MED ORDER — CITALOPRAM HYDROBROMIDE 10 MG PO TABS
10.0000 mg | ORAL_TABLET | Freq: Every day | ORAL | Status: DC
  Administered 2012-04-06 – 2012-04-07 (×2): 10 mg via ORAL
  Filled 2012-04-06 (×2): qty 1

## 2012-04-06 MED ORDER — POLYETHYLENE GLYCOL 3350 17 G PO PACK
17.0000 g | PACK | Freq: Every day | ORAL | Status: DC
  Administered 2012-04-06: 17 g via ORAL
  Filled 2012-04-06 (×2): qty 1

## 2012-04-06 MED ORDER — SIMVASTATIN 20 MG PO TABS
20.0000 mg | ORAL_TABLET | Freq: Every day | ORAL | Status: DC
  Administered 2012-04-06: 20 mg via ORAL
  Filled 2012-04-06 (×2): qty 1

## 2012-04-06 MED ORDER — INSULIN ASPART 100 UNIT/ML ~~LOC~~ SOLN
0.0000 [IU] | Freq: Three times a day (TID) | SUBCUTANEOUS | Status: DC
  Administered 2012-04-06: 2 [IU] via SUBCUTANEOUS
  Administered 2012-04-06: 3 [IU] via SUBCUTANEOUS
  Administered 2012-04-06 – 2012-04-07 (×2): 2 [IU] via SUBCUTANEOUS

## 2012-04-06 NOTE — Progress Notes (Signed)
Still tolerating diet.  Wound examined and the erythema is improved.  Wound repacked.  His foot is feeling better.  Home health not able to come out until Monday and I tried to teach his wife how to care for the wound but she nearly fainted and is not really capable of the wound care.  We will keep him here and plan for discharge Sunday with home health set up for Monday for wound care.  I will be out of town this weekend and Dr. Michaell Cowing or Dr. Ezzard Standing will be caring for him this weekend. Since he will be here this weekend, I will ask the hospitalist to evaluate his gout and make recommendations for treatment.

## 2012-04-06 NOTE — Progress Notes (Signed)
Talked the patient's wife who stated that the did the surgery privately and not under the [employer] benefits and if it was changed in the system it was an error. Patient's wife stated that she will go to the admitting office and have his insurance changed back to [facility] as primary payer source.  Received call from Norberta Keens RN with [facility], stated that they will be able to see the patient Sunday if the patient is discharged on Saturday. Attending MD please call the Case Manager covering for the weekend, Sharmon Leyden RN [phone]) to make sure that [facility] can see the patient on Sunday if the patient is discharged on Saturday 04/07/2012.

## 2012-04-06 NOTE — Progress Notes (Signed)
Norberta Keens with [facility] called concerning [facility], patient set to be discharged home Sunday per Attending MD; Per Epic, patient's insurance has been changed to the [place] and [facility] will not accept that insurance.  Dorene Sorrow with [facility] called, he will contact his main office to see if he can accept the patient. Awaiting a call back.

## 2012-04-06 NOTE — Progress Notes (Signed)
Talked to patient with spouse present about DCP/ HHC; Home health care list provided, patient chose [facility]; telephone call to Norberta Keens RN with [facility] called, they cannot see the patient until Monday of next week;   Solicitor is listed as Public house manager [facility] called, spoke to [place], they do not accept the patient's insurance. [facility] called, per Corrie Dandy they do not accept the patient's insurance. [facility] called, will not accept the patient's insurance [facility] called, they have not staff available until mid next week [facility] called, they do not take the patient's insurance;  Genella Rife RN Case Management Asst Director called, updated concerning inability to arrange [facility] for the patient; Attending MD updated

## 2012-04-06 NOTE — Progress Notes (Signed)
9 Days Post-Op  Subjective: Still tolerating diet.  Right foot pain better and moving toe and foot.    Objective: Vital signs in last 24 hours: Temp:  [97.9 F (36.6 C)-98.8 F (37.1 C)] 98 F (36.7 C) (08/09 0538) Pulse Rate:  [70-89] 70  (08/09 0538) Resp:  [16-18] 16  (08/09 0538) BP: (114-128)/(70-82) 114/75 mmHg (08/09 0538) SpO2:  [95 %-97 %] 96 % (08/09 0538) Last BM Date: 04/03/12  Intake/Output from previous day: 08/08 0701 - 08/09 0700 In: 720 [P.O.:720] Out: 2350 [Urine:2350] Intake/Output this shift:    General appearance: alert, cooperative and no distress Resp: clear to auscultation bilaterally Cardio: regular rate and rhythm, S1, S2 normal, no murmur, click, rub or gallop GI: soft, NT, ND, incision still with some slight erythema at base of wound, I opened the lower aspect of the indision and drained mostly seroma fluid but did have slight purulence. wound packed Extremities: right foot much less tender and moving toes and ankle, dorsum of the foot mildly tender still  Lab Results:  No results found for this basename: WBC:2,HGB:2,HCT:2,PLT:2 in the last 72 hours BMET No results found for this basename: NA:2,K:2,CL:2,CO2:2,GLUCOSE:2,BUN:2,CREATININE:2,CALCIUM:2 in the last 72 hours PT/INR No results found for this basename: LABPROT:2,INR:2 in the last 72 hours ABG No results found for this basename: PHART:2,PCO2:2,PO2:2,HCO3:2 in the last 72 hours  Studies/Results: No results found.  Anti-infectives: Anti-infectives     Start     Dose/Rate Route Frequency Ordered Stop   04/05/12 0800   clindamycin (CLEOCIN) capsule 300 mg        300 mg Oral 4 times per day 04/05/12 0981     03/28/12 2100   ciprofloxacin (CIPRO) IVPB 400 mg        400 mg 200 mL/hr over 60 Minutes Intravenous Every 12 hours 03/28/12 1512 03/28/12 2259   03/28/12 1600   metroNIDAZOLE (FLAGYL) IVPB 500 mg        500 mg 100 mL/hr over 60 Minutes Intravenous Every 8 hours 03/28/12 1512  03/29/12 0138   03/28/12 0830   ciprofloxacin (CIPRO) IVPB 400 mg        400 mg 200 mL/hr over 60 Minutes Intravenous  Once 03/28/12 0829 03/28/12 0905   03/28/12 0634   metroNIDAZOLE (FLAGYL) IVPB 500 mg        500 mg 100 mL/hr over 60 Minutes Intravenous 60 min pre-op 03/28/12 0634 03/28/12 0838          Assessment/Plan: s/p Procedure(s) (LRB): LAPAROSCOPIC SIGMOID COLECTOMY (N/A) He may still be able to go home today if able to set up home health for him for wound care.  I opened his wound and drained some seroma fluid.  His gouty flare is better but will need to continue NSAIDS for this.  I instructed him how to dose and continue his NSAIDS at home for treatment.  LOS: 9 days    Cameron Daniel DAVID 04/06/2012

## 2012-04-06 NOTE — Consult Note (Signed)
Medical Consultation  Cameron Daniel NWG:[id number] DOB: [birth year] DOA: 03/28/2012  Referring physician: [name], MD PCP: Leo Grosser, MD   Date of consultation: 04/06/2012  Reason for consultation: gout  Chief Complaint: pain in right foot  HPI:  66 year old man status-post sigmoid colectomy. He developed pain in his right foot recently consistent with previous history of gout. Last attack was about 4 years ago, and he is not on any preventative therapy. He was started on indomethacin and reports improvement, with decreased pain and erythema in right foot. Pain over dorsum of foot, medially extending to the ankle. No toe pain. I was asked to make further recommendations for gout.  Review of Systems:  Denies fever, visual changes, sore throat, rash, no muscle aches, chest pain, shortness of breath, dysuria, bleeding, nausea, vomiting.  Past Medical History  Diagnosis Date  . Unspecified essential hypertension   . Other and unspecified hyperlipidemia   . Obesity, unspecified   . Esophageal reflux   . Nocturia   . Flushing   . Other malaise and fatigue   . Type II or unspecified type diabetes mellitus without mention of complication, not stated as uncontrolled   . Allergic rhinitis, cause unspecified   . Other acquired absence of organ   . Psychosexual dysfunction with inhibited sexual excitement   . Irritable bowel syndrome   . Diverticulosis of colon (without mention of hemorrhage)   . Personal history of colonic polyps   . Coronary atherosclerosis of unspecified type of vessel, native or graft     1985 , arthroplasty x 1   . Myocardial infarction   . Depression     PTSD    Past Surgical History  Procedure Date  . Elbow bursa surgery   . Cholecystectomy   . Shoulder surgery     Rt. Shoulder  . Carpal tunnel release     left   . Coronary angioplasty     1985    Social History:  reports that he quit smoking about 28 years ago. He has quit using smokeless  tobacco. He reports that he drinks alcohol. He reports that he does not use illicit drugs.  Allergies  Allergen Reactions  . Doxazosin Mesylate     Reaction=unknown  . Penicillins Rash  . Sulfonamide Derivatives Rash   Family History  Problem Relation Age of Onset  . Lung cancer Father   . Cancer Father     Lung  . Coronary artery disease Mother   . Colon cancer Neg Hx     Prior to Admission medications   Medication Sig Start Date End Date Taking? Authorizing Provider  cholecalciferol (VITAMIN D) 1000 UNITS tablet Take 2,000 Units by mouth daily.   Yes Historical Provider, MD  cholestyramine Lanetta Inch) 4 G packet Take 1 packet by mouth 3 (three) times daily with meals.    Yes Historical Provider, MD  citalopram (CELEXA) 10 MG tablet Take 10 mg by mouth daily.   Yes Historical Provider, MD  Multiple Vitamin (MULTIVITAMIN) tablet Take 1 tablet by mouth daily.    Yes Historical Provider, MD  olmesartan (BENICAR) 20 MG tablet Take 20 mg by mouth every morning.   Yes Historical Provider, MD  pantoprazole (PROTONIX) 40 MG tablet Take 40 mg by mouth every morning.    Yes Historical Provider, MD  pioglitazone (ACTOS) 15 MG tablet Take 15 mg by mouth every morning.    Yes Historical Provider, MD  simvastatin (ZOCOR) 20 MG tablet Take 20 mg by mouth at  bedtime.    Yes Historical Provider, MD  aspirin 81 MG tablet Take 81 mg by mouth every morning.     Historical Provider, MD  fexofenadine (ALLEGRA) 180 MG tablet Take 180 mg by mouth daily as needed. For allergies    Historical Provider, MD  magnesium oxide (MAG-OX) 400 MG tablet Take 400 mg by mouth daily.     Historical Provider, MD  Testosterone (ANDROGEL) 20.25 MG/1.25GM (1.62%) GEL Place onto the skin daily. 2 pumps daily    Historical Provider, MD   Physical Exam: Blood pressure 122/76, pulse 76, temperature 97.6 F (36.4 C), temperature source Oral, resp. rate 18, height 5\' 11"  (1.803 m), weight 114.08 kg (251 lb 8 oz), SpO2  96.00%. Filed Vitals:   04/05/12 1422 04/05/12 2120 04/06/12 0538 04/06/12 1430  BP: 128/82 119/70 114/75 122/76  Pulse: 89 85 70 76  Temp: 98.8 F (37.1 C) 97.9 F (36.6 C) 98 F (36.7 C) 97.6 F (36.4 C)  TempSrc: Oral Oral Oral Oral  Resp: 18 18 16 18   Height:      Weight:      SpO2: 95% 97% 96% 96%     General:  Appears calm and comfortable  Eyes: PEERL, normal lids, irises, conjunctiva  ENT: grossly normal hearing, lips and tongue appear unremarkable  Neck: no LAD or masses, no thyromegaly  Cardiovascular: RRR, no m/r/g. Minimal right foot edema, no LLE edema  Respiratory: CTA bilaterally, no w/r/r. Normal respiratory effort.  Abdomen: soft  Skin: right foot: erythema of dorsum, extending medially to ankle, minimal warmth, no fluctuance or pain with palpation.  Musculoskeletal: as above  Psychiatric: grossly normal mood and affect, speech fluent and appropriate  Neurologic: appears grossly normal  Labs on Admission:  Basic Metabolic Panel:  Lab 03/31/12 1610  NA 132*  K 4.1  CL 96  CO2 27  GLUCOSE 123*  BUN 8  CREATININE 0.79  CALCIUM 9.4  MG --  PHOS --   CBC:  Lab 03/31/12 0437  WBC 10.4  NEUTROABS 7.6  HGB 13.5  HCT 40.6  MCV 88.6  PLT 152   CBG:  Lab 04/06/12 1140 04/06/12 0714 04/05/12 2117 04/05/12 1703 04/05/12 1143  GLUCAP 177* 130* 126* 145* 153*   Impression/Recommendations 1. Gout--improving. Agree with indomethacin. Given improvement recommend taper rapidly--suggest change to BID 8/10 and then daily 8/11. Use for minimal duration possible. Recommend BMP in AM to assess renal function. 2. HTN--stable. Can resume Benicar on discharge. 3. Diabetes mellitus, type 2--stable. Can resume Actos on discharge  Time Spent: 25 minutes  I will followup again in the morning. Please contact me if I can be of assistance in the meanwhile. Thank you for this consultation.  Brendia Sacks, MD  [facility] Pager [phone]  If  8PM-8AM, please contact night-coverage [email or web address] Password [facility] 04/06/2012, 4:41 PM

## 2012-04-07 LAB — BASIC METABOLIC PANEL
BUN: 14 mg/dL (ref 6–23)
Calcium: 9.6 mg/dL (ref 8.4–10.5)
GFR calc Af Amer: >90 mL/min (ref >90)
GFR calc non Af Amer: >90 mL/min (ref >90)
Glucose, Bld: 148 mg/dL — ABNORMAL HIGH (ref 70–99)
Sodium: 133 mEq/L — ABNORMAL LOW (ref 135–145)

## 2012-04-07 LAB — GLUCOSE, CAPILLARY: Glucose-Capillary: 139 mg/dL — ABNORMAL HIGH (ref 70–99)

## 2012-04-07 MED ORDER — INDOMETHACIN 50 MG PO CAPS: 50.0000 mg | ORAL_CAPSULE | Freq: Two times a day (BID) | ORAL | Status: AC

## 2012-04-07 MED ORDER — POTASSIUM CHLORIDE CRYS ER 20 MEQ PO TBCR
40.0000 meq | EXTENDED_RELEASE_TABLET | Freq: Once | ORAL | Status: AC
  Administered 2012-04-07: 40 meq via ORAL
  Filled 2012-04-07: qty 2

## 2012-04-07 MED ORDER — OXYCODONE-ACETAMINOPHEN 5-325 MG PO TABS: 1.0000 | ORAL_TABLET | ORAL | Status: AC | PRN

## 2012-04-07 NOTE — Consult Note (Signed)
[facility] CONSULT FOLLOW-UP NOTE  Cameron Daniel:[id number] DOB: 12-11-1945 DOA: 03/28/2012 PCP: Leo Grosser, MD Requesting physician: [name], MD  Patient continues to improve. Gout rapidly resolving. Ambulating in hallway. BMP reassuring.  Discussed with Dr. Abbey Chatters. Discharge planned today. No new recs. Will sign off.  Brendia Sacks, MD  [facility] Pager [phone]. If 8PM-8AM, please contact night-coverage at [email or web address], password [facility] 04/07/2012, 10:22 AM  LOS: 10 days

## 2012-04-07 NOTE — Progress Notes (Signed)
10 Days Post-Op  Subjective: Feels good.  No complaints.  Objective: Vital signs in last 24 hours: Temp:  [97.6 F (36.4 C)-98.2 F (36.8 C)] 98.2 F (36.8 C) (08/10 0525) Pulse Rate:  [76-80] 80  (08/10 0525) Resp:  [16-18] 16  (08/10 0525) BP: (122-129)/(75-81) 122/75 mmHg (08/10 0525) SpO2:  [96 %-99 %] 98 % (08/10 0525) Last BM Date: 04/06/12  Intake/Output from previous day: 08/09 0701 - 08/10 0700 In: 960 [P.O.:960] Out: 700 [Urine:700] Intake/Output this shift: Total I/O In: 360 [P.O.:360] Out: 200 [Urine:200]  PE: Abd-soft, open wound is fairly clean  Lab Results:  No results found for this basename: WBC:2,HGB:2,HCT:2,PLT:2 in the last 72 hours BMET  Basename 04/07/12 0410  NA 133*  K 3.2*  CL 95*  CO2 27  GLUCOSE 148*  BUN 14  CREATININE 0.78  CALCIUM 9.6   PT/INR No results found for this basename: LABPROT:2,INR:2 in the last 72 hours Comprehensive Metabolic Panel:    Component Value Date/Time   NA 133* 04/07/2012 0410   K 3.2* 04/07/2012 0410   CL 95* 04/07/2012 0410   CO2 27 04/07/2012 0410   BUN 14 04/07/2012 0410   CREATININE 0.78 04/07/2012 0410   GLUCOSE 148* 04/07/2012 0410   CALCIUM 9.6 04/07/2012 0410   AST 27 03/23/2012 0905   ALT 30 03/23/2012 0905   ALKPHOS 76 03/23/2012 0905   BILITOT 0.4 03/23/2012 0905   PROT 6.9 03/23/2012 0905   ALBUMIN 3.9 03/23/2012 0905     Studies/Results: No results found.  Anti-infectives: Anti-infectives     Start     Dose/Rate Route Frequency Ordered Stop   04/06/12 0815   ertapenem (INVANZ) 1 g in sodium chloride 0.9 % 50 mL IVPB  Status:  Discontinued        1 g 100 mL/hr over 30 Minutes Intravenous  Once 04/06/12 0807 04/06/12 0808   04/05/12 0800   clindamycin (CLEOCIN) capsule 300 mg        300 mg Oral 4 times per day 04/05/12 0702     03/28/12 2100   ciprofloxacin (CIPRO) IVPB 400 mg        400 mg 200 mL/hr over 60 Minutes Intravenous Every 12 hours 03/28/12 1512 03/28/12 2259   03/28/12 1600    metroNIDAZOLE (FLAGYL) IVPB 500 mg        500 mg 100 mL/hr over 60 Minutes Intravenous Every 8 hours 03/28/12 1512 03/29/12 0138   03/28/12 0830   ciprofloxacin (CIPRO) IVPB 400 mg        400 mg 200 mL/hr over 60 Minutes Intravenous  Once 03/28/12 0829 03/28/12 0905   03/28/12 0634   metroNIDAZOLE (FLAGYL) IVPB 500 mg        500 mg 100 mL/hr over 60 Minutes Intravenous 60 min pre-op 03/28/12 1610 03/28/12 9604          Assessment Active Problems:  Sigmoid colectomy for diverticular disease 03/28/12  Wound infection-open and drained.  DIABETES MELLITUS, TYPE II  HYPERTENSION  Gout    LOS: 10 days   Plan: Discharge today with [facility] starting tomorrow.  Follow up with Dr. Biagio Quint next week.   Baby Gieger J 04/07/2012

## 2012-04-07 NOTE — Care Management (Signed)
Cm spoke with [facility] patient intake coordinator Britta Mccreedy at [phone]. Patient scheduled to receive skilled nursing Sunday 04/08/12. MD orders faxed to [facility] at [phone]. No other needs specified at this time.    Leonie Green [phone]

## 2012-04-09 ENCOUNTER — Telehealth (INDEPENDENT_AMBULATORY_CARE_PROVIDER_SITE_OTHER): Payer: Self-pay | Admitting: General Surgery

## 2012-04-09 NOTE — Telephone Encounter (Signed)
Pt's wife calling for post op appt; was instructed by Dr. Abbey Chatters to see Dr. Biagio Quint on Friday.  Pt still has open wound with Home Health coming daily for dressing changes.  He had a lengthy hospital stay, secondary to infection.  Please advise a time Friday for him to come in.

## 2012-04-09 NOTE — Telephone Encounter (Signed)
Working pt in to Dr. Delice Lesch schedule on Friday at 12:15.  Wife notified.

## 2012-04-13 ENCOUNTER — Ambulatory Visit (INDEPENDENT_AMBULATORY_CARE_PROVIDER_SITE_OTHER): Payer: Medicare Other | Admitting: General Surgery

## 2012-04-13 ENCOUNTER — Encounter (INDEPENDENT_AMBULATORY_CARE_PROVIDER_SITE_OTHER): Payer: Self-pay | Admitting: General Surgery

## 2012-04-13 ENCOUNTER — Encounter: Payer: Self-pay | Admitting: Internal Medicine

## 2012-04-13 VITALS — BP 122/74 | HR 88 | Ht 71.0 in | Wt 236.8 lb

## 2012-04-13 DIAGNOSIS — Z5189 Encounter for other specified aftercare: Secondary | ICD-10-CM

## 2012-04-13 DIAGNOSIS — Z4889 Encounter for other specified surgical aftercare: Secondary | ICD-10-CM

## 2012-04-13 NOTE — Progress Notes (Signed)
Subjective:     Patient ID: Cameron Daniel, male   DOB: March 25, 1946, 66 y.o.   MRN: [id number]  HPI This patient follows up in approximately 2-3 weeks status post sigmoid colectomy for diverticulitis. Prior to his discharge, we had to open up the lower aspect of his incision and he is at home health wound care for this and his wife is also caring for the wound. Is not any fevers or chills and has moved his bowels. He is moving his bowels about 1-2 times per day with soft stools and no blood. He is taking stool softeners. He still has some discomfort in his foot from his flare of gout but this is much improved as well. He is tolerating regular diet and supplementing with some protein as well.  Review of Systems     Objective:   Physical Exam Is in no distress and nontoxic-appearing His incision is healing well I removed the remainder of the staples and applied some benzoin and Steri-Strips. The lower 2 cm the wound is healing very rapidly with healthy granulation tissue and only accommodated a 2 x 2 gauze today. I suspect that this should continue to heal well and should be healed in the next 2-3 weeks. The erythema has completely resolved. His foot swelling has improved and he has full mobility. It looks essentially normal to me. He has some small amount of hemorrhoidal tissue at the left lateral position without evidence of thrombosis or other complication    Assessment:     Status post sigmoid colectomy-improving He seems to be improving daily from his surgery although he still has some recover to do. His gout looks like it has resolved and I recommended that he followup with his primary care physician for possible maintenance therapy were continued treatment for this. His pathology was benign. He is still on stool softeners and I recommended that he titrates as needed. He complained of some hemorrhoids although on exam he really had very minimal evidence of hemorrhoids. With a high fiber diet  and his continued stool softeners, this should improve as well    Plan:     Continue current wound care to see him back in about 2-3 weeks. Otherwise, increase diet as tolerated and work on strengthening and regaining his strength.

## 2012-04-17 NOTE — Discharge Summary (Signed)
Physician Discharge Summary  Patient ID: Cameron Daniel MRN: [id number] DOB/AGE: 65/65/1947 65 y.o.  Admit date: 03/28/12 Discharge date: 04/07/12  Admission Diagnoses: diverticulosis  Discharge Diagnoses: diverticulosis Active Problems:  DIABETES MELLITUS, TYPE II  HYPERTENSION  Gout   Discharged Condition: good  Hospital Course: to OR for lap sigmoid colectomy 03/28/12.  Due to his habitus and difficulty with the procedure, we enlarged the extraction site and a good portion was done in open fashion.  See op note for details.  I waited until the return of bowel function to begin diet and this was slow.  Once his bowels started to show function, we advanced his diet and he tolerated this well without nausea or vomiting.  He was nearing discharge and he had a flare of his gout in his right foot which was treated with indomethacin and responded nicely.  Also, on the morning of the planned discharge, I opened the lower 2cm of his wound for some erythema and the wound was packed.  His wife and family was not able to do the packing and so home health was asked to continue the wound care.  He had to stay an additional day for wound care waiting for home health to be available for wound care.  Consults: IM for management recommendations of gout  Significant Diagnostic Studies:   Treatments: surgery: 03/28/12 for lap-open sigmoidectomy  Disposition: 06-Home-Health Care Svc  Discharge Orders    Future Appointments: Provider: Department: Dept Phone: Center:   04/27/2012 12:00 PM [name], DO [facility] [phone] None     Medication List  As of 04/17/2012 12:18 PM   TAKE these medications         ACTOS 15 MG tablet   Generic drug: pioglitazone   Take 15 mg by mouth every morning.      ANDROGEL 20.25 MG/1.25GM (1.62%) Gel   Generic drug: Testosterone   Place onto the skin daily. 2 pumps daily      aspirin 81 MG tablet   Take 81 mg by mouth every morning.     cholecalciferol 1000 UNITS tablet   Commonly known as: VITAMIN D   Take 2,000 Units by mouth daily.      cholestyramine 4 G packet   Commonly known as: QUESTRAN   Take 1 packet by mouth 3 (three) times daily with meals.      citalopram 10 MG tablet   Commonly known as: CELEXA   Take 10 mg by mouth daily.      fexofenadine 180 MG tablet   Commonly known as: ALLEGRA   Take 180 mg by mouth daily as needed. For allergies      indomethacin 50 MG capsule   Commonly known as: INDOCIN   Take 1 capsule (50 mg total) by mouth 2 (two) times daily with a meal.      magnesium oxide 400 MG tablet   Commonly known as: MAG-OX   Take 400 mg by mouth daily.      multivitamin tablet   Take 1 tablet by mouth daily.      olmesartan 20 MG tablet   Commonly known as: BENICAR   Take 20 mg by mouth every morning.      oxyCODONE-acetaminophen 5-325 MG per tablet   Commonly known as: PERCOCET/ROXICET   Take 1 tablet by mouth every 4 (four) hours as needed (pain).      pantoprazole 40 MG tablet   Commonly known as: PROTONIX   Take 40 mg by  mouth every morning.      simvastatin 20 MG tablet   Commonly known as: ZOCOR   Take 20 mg by mouth at bedtime.             SignedLodema Pilot DAVID 04/17/2012, 12:18 PM

## 2012-04-27 ENCOUNTER — Ambulatory Visit (INDEPENDENT_AMBULATORY_CARE_PROVIDER_SITE_OTHER): Payer: Medicare Other | Admitting: General Surgery

## 2012-04-27 ENCOUNTER — Encounter (INDEPENDENT_AMBULATORY_CARE_PROVIDER_SITE_OTHER): Payer: Self-pay | Admitting: General Surgery

## 2012-04-27 VITALS — BP 140/88 | HR 74 | Temp 97.3°F | Resp 16 | Wt 240.8 lb

## 2012-04-27 DIAGNOSIS — Z4889 Encounter for other specified surgical aftercare: Secondary | ICD-10-CM

## 2012-04-27 DIAGNOSIS — Z5189 Encounter for other specified aftercare: Secondary | ICD-10-CM

## 2012-04-27 NOTE — Progress Notes (Signed)
Subjective:     Patient ID: Cameron Daniel, male   DOB: [birth year], 66 y.o.   MRN: [id number]  HPI The patient follows up almost one month status postsigmoid colectomy for treatment of diverticulitis. He continues to do well and is regain much of the strength and has begun to return to normal activities. He is eating well without any complaints he does have return of bowel function and his bowels are prelunch normal. He has an occasional "twinges" in the left lower quadrant but otherwise has no significant his comfort. He continues to pack his wound at the lower midline and this seems to be doing well.  Review of Systems     Objective:   Physical Exam No acute distress and nontoxic-appearing his incision is healing well without sign of infection. The were part of his wound is almost completely healed and his various shallow now. I did replace a 2 x 2 gauze and the wound but he shouldn't need packing for much longer.    Assessment:     Status post sigmoid colectomy-doing well He seems to be improving greatly and is soon as his wound is healed and another 2-3 or 4 weeks, he should be able to return to activity as tolerated.    Plan:     Continue current wound care and gradually increase activity and I'll see him back in 3 weeks

## 2012-05-07 ENCOUNTER — Ambulatory Visit (INDEPENDENT_AMBULATORY_CARE_PROVIDER_SITE_OTHER): Payer: Medicare Other | Admitting: General Surgery

## 2012-05-07 ENCOUNTER — Encounter (INDEPENDENT_AMBULATORY_CARE_PROVIDER_SITE_OTHER): Payer: Self-pay | Admitting: General Surgery

## 2012-05-07 VITALS — BP 132/84 | HR 98 | Temp 97.8°F | Ht 71.0 in | Wt 246.8 lb

## 2012-05-07 DIAGNOSIS — Z5189 Encounter for other specified aftercare: Secondary | ICD-10-CM

## 2012-05-07 DIAGNOSIS — Z4889 Encounter for other specified surgical aftercare: Secondary | ICD-10-CM

## 2012-05-07 NOTE — Progress Notes (Signed)
Subjective:     Patient ID: Cameron Daniel, male   DOB: November 12, 1945, 66 y.o.   MRN: [id number]  HPI This patient follows up status post sigmoid colectomy for diverticulitis. This was complicated with postoperative wound infection and he has been packing his wound since his procedure. Since the last time I saw him, he has had some clear drainage from the lower aspect of his wound which he says is not foul smelling and he has no tenderness in the area and no fevers.  Review of Systems     Objective:   Physical Exam At the lower aspect of his wound he has a 1 cm opening with some granulation tissue and some serous drainage on the bandage. There is no evidence of any enteric contents or evidence of colocutaneous fistula. I probed the wound and it is approximately 3 cm deep. However, I could not pack a quarter inch packing strip in the wound. I redressed it and applied some silver nitrate.    Assessment:     Status post sigmoid colectomy I'm not sure if exactly what this drainage is from. It appears to be serous fluid in is likely form a seroma which is draining. There is no evidence of infection. I recommended that he change the bandage frequently to avoid moisture and the skin otherwise I will see him back in about one week for repeat evaluation and wound check.    Plan:     followup in 1-2 weeks for repeat evaluation. Wound care as discussed.

## 2012-05-18 ENCOUNTER — Encounter (INDEPENDENT_AMBULATORY_CARE_PROVIDER_SITE_OTHER): Payer: Self-pay | Admitting: General Surgery

## 2012-05-18 ENCOUNTER — Ambulatory Visit (INDEPENDENT_AMBULATORY_CARE_PROVIDER_SITE_OTHER): Payer: Medicare Other | Admitting: General Surgery

## 2012-05-18 VITALS — BP 120/70 | HR 93 | Temp 98.3°F | Ht 71.0 in | Wt 248.2 lb

## 2012-05-18 DIAGNOSIS — Z4889 Encounter for other specified surgical aftercare: Secondary | ICD-10-CM

## 2012-05-18 DIAGNOSIS — Z5189 Encounter for other specified aftercare: Secondary | ICD-10-CM

## 2012-05-18 NOTE — Progress Notes (Signed)
Subjective:     Patient ID: Cameron Daniel, male   DOB: 16-Jun-1946, 66 y.o.   MRN: [id number]  HPI  This patient follows up to months status post sigmoid colectomy for diverticulitis. He says that the drainage from the lower portion of his wound is much decreased and is really just a very small amount. He feels much better and continues to improve every day. Review of Systems     Objective:   Physical Exam The wound is almost completely healed he just has a 2-3 mm area of granulation tissue at the lower portion of his wounds and applied some silver nitrate again to the area. I think at this will completely heal over the next 2 weeks.    Assessment:     Status post sigmoid colectomy-improving  He seems to be much improved. The drainage is nearly completely resolved. I think that this will be healed within the next 2 weeks.     Plan:     He has an appointment already scheduled for 10 days from now and he can keep that appointment if he still has drainage from the wound otherwise he can followup on a p.r.n. basis

## 2012-06-01 ENCOUNTER — Encounter (INDEPENDENT_AMBULATORY_CARE_PROVIDER_SITE_OTHER): Payer: Medicare Other | Admitting: General Surgery

## 2012-10-16 ENCOUNTER — Ambulatory Visit: Payer: Medicare Other | Attending: Orthopaedic Surgery | Admitting: Occupational Therapy

## 2012-10-16 DIAGNOSIS — M6281 Muscle weakness (generalized): Secondary | ICD-10-CM | POA: Insufficient documentation

## 2012-10-16 DIAGNOSIS — IMO0001 Reserved for inherently not codable concepts without codable children: Secondary | ICD-10-CM | POA: Insufficient documentation

## 2012-10-16 DIAGNOSIS — M25649 Stiffness of unspecified hand, not elsewhere classified: Secondary | ICD-10-CM | POA: Insufficient documentation

## 2012-10-18 ENCOUNTER — Ambulatory Visit: Payer: Medicare Other | Admitting: Occupational Therapy

## 2012-10-23 ENCOUNTER — Ambulatory Visit: Payer: Medicare Other | Admitting: Occupational Therapy

## 2012-10-25 ENCOUNTER — Ambulatory Visit: Payer: Medicare Other | Admitting: Occupational Therapy

## 2012-10-30 ENCOUNTER — Ambulatory Visit: Payer: Medicare Other | Admitting: Occupational Therapy

## 2012-11-01 ENCOUNTER — Ambulatory Visit: Payer: Medicare Other | Attending: Orthopaedic Surgery | Admitting: Occupational Therapy

## 2012-11-01 DIAGNOSIS — M6281 Muscle weakness (generalized): Secondary | ICD-10-CM | POA: Insufficient documentation

## 2012-11-01 DIAGNOSIS — IMO0001 Reserved for inherently not codable concepts without codable children: Secondary | ICD-10-CM | POA: Insufficient documentation

## 2012-11-01 DIAGNOSIS — M25649 Stiffness of unspecified hand, not elsewhere classified: Secondary | ICD-10-CM | POA: Insufficient documentation

## 2012-11-06 ENCOUNTER — Ambulatory Visit: Payer: Medicare Other | Admitting: Occupational Therapy

## 2012-11-08 ENCOUNTER — Ambulatory Visit: Payer: Medicare Other | Admitting: Occupational Therapy

## 2012-11-13 ENCOUNTER — Ambulatory Visit: Payer: Medicare Other | Admitting: Occupational Therapy

## 2012-11-15 ENCOUNTER — Ambulatory Visit: Payer: Medicare Other | Admitting: Occupational Therapy

## 2012-11-22 ENCOUNTER — Encounter: Payer: Medicare Other | Admitting: Occupational Therapy

## 2012-12-05 ENCOUNTER — Encounter: Payer: Medicare Other | Admitting: Occupational Therapy

## 2012-12-06 ENCOUNTER — Encounter: Payer: Self-pay | Admitting: Internal Medicine

## 2012-12-13 ENCOUNTER — Encounter: Payer: Self-pay | Admitting: Internal Medicine

## 2012-12-20 ENCOUNTER — Ambulatory Visit (INDEPENDENT_AMBULATORY_CARE_PROVIDER_SITE_OTHER): Payer: Medicare Other | Admitting: Family Medicine

## 2012-12-20 ENCOUNTER — Encounter: Payer: Self-pay | Admitting: Family Medicine

## 2012-12-20 VITALS — BP 140/82 | HR 90 | Temp 97.9°F | Resp 20 | Wt 265.0 lb

## 2012-12-20 DIAGNOSIS — J309 Allergic rhinitis, unspecified: Secondary | ICD-10-CM

## 2012-12-20 MED ORDER — MONTELUKAST SODIUM 10 MG PO TABS: 10.0000 mg | ORAL_TABLET | Freq: Every day | ORAL | Status: DC

## 2012-12-20 MED ORDER — METHYLPREDNISOLONE ACETATE 40 MG/ML IJ SUSP
80.0000 mg | Freq: Once | INTRAMUSCULAR | Status: AC
  Administered 2012-12-20: 80 mg via INTRAMUSCULAR

## 2012-12-20 NOTE — Progress Notes (Signed)
Subjective:    Patient ID: Cameron Daniel, male    DOB: [birth year], 67 y.o.   MRN: [id number]  HPI  Patient is here today with moderate to severe allergies. He has a history of severe allergies. He takes Allegra 180 mg by mouth daily, Nasacort 2 sprays each nostril daily. 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 Now both eyes are really swollen and puffy. His nasal passages are completely occluded. He has a dry raspy cough. He is reporting some mild shortness of breath and wheezing. Past Medical History  Diagnosis Date  . Unspecified essential hypertension   . Other and unspecified hyperlipidemia   . Obesity, unspecified   . Esophageal reflux   . Nocturia   . Flushing   . Other malaise and fatigue   . Type II or unspecified type diabetes mellitus without mention of complication, not stated as uncontrolled   . Allergic rhinitis, cause unspecified   . Other acquired absence of organ   . Psychosexual dysfunction with inhibited sexual excitement   . Irritable bowel syndrome   . Diverticulosis of colon (without mention of hemorrhage)   . Personal history of colonic polyps   . Coronary atherosclerosis of unspecified type of vessel, native or graft     1985 , arthroplasty x 1   . Myocardial infarction   . Depression     PTSD    Current Outpatient Prescriptions on File Prior to Visit  Medication Sig Dispense Refill  . aspirin 81 MG tablet Take 81 mg by mouth every morning.       . cholecalciferol (VITAMIN D) 1000 UNITS tablet Take 2,000 Units by mouth daily.      . cholestyramine (QUESTRAN) 4 G packet Take 1 packet by mouth 3 (three) times daily with meals.       . citalopram (CELEXA) 10 MG tablet Take 10 mg by mouth daily.      Marland Kitchen COLCRYS 0.6 MG tablet       . fexofenadine (ALLEGRA) 180 MG tablet Take 180 mg by mouth daily as needed. For allergies      . Multiple Vitamin (MULTIVITAMIN)  tablet Take 1 tablet by mouth daily.       Marland Kitchen olmesartan (BENICAR) 20 MG tablet Take 20 mg by mouth every morning.      . pantoprazole (PROTONIX) 40 MG tablet Take 40 mg by mouth every morning.       . pioglitazone (ACTOS) 15 MG tablet Take 15 mg by mouth every morning.       . simvastatin (ZOCOR) 20 MG tablet Take 20 mg by mouth at bedtime.       . Testosterone (ANDROGEL) 20.25 MG/1.25GM (1.62%) GEL Place onto the skin daily. 2 pumps daily       No current facility-administered medications on file prior to visit.   Allergies  Allergen Reactions  . Doxazosin Mesylate     Reaction=unknown  . Penicillins Rash  . Sulfonamide Derivatives Rash     Review of Systems  All other systems reviewed and are negative.       Objective:   Physical Exam  Constitutional: He appears well-developed and well-nourished.  HENT:  Head: Normocephalic and atraumatic.  Right Ear: Tympanic membrane and ear canal normal.  Left Ear: Tympanic membrane and ear canal normal.  Nose: Mucosal edema and rhinorrhea present. No epistaxis. Right sinus exhibits no maxillary sinus tenderness and no frontal  sinus tenderness. Left sinus exhibits no maxillary sinus tenderness and no frontal sinus tenderness.  Mouth/Throat: Oropharynx is clear and moist and mucous membranes are normal.  Cardiovascular: Normal rate, regular rhythm and normal heart sounds.   Pulmonary/Chest: Effort normal and breath sounds normal. No respiratory distress. He has no wheezes. He has no rales.          Assessment & Plan:  1. Allergic rhinitis Discussed the risk of steroid injection. The patient was still like to proceed due to severity of his symptoms to try get some control. I did recommend, given his line of work and severe allergies that he add something to his medical regimen. Therefore we'll start Singulair 10 mg by mouth daily that he is to begin taking immediately. - methylPREDNISolone acetate (DEPO-MEDROL) injection 80 mg; Inject 2  mLs (80 mg total) into the muscle once. - montelukast (SINGULAIR) 10 MG tablet; Take 1 tablet (10 mg total) by mouth at bedtime.  Dispense: 30 tablet; Refill: 3

## 2012-12-24 ENCOUNTER — Encounter: Payer: Self-pay | Admitting: Family Medicine

## 2012-12-24 ENCOUNTER — Ambulatory Visit (INDEPENDENT_AMBULATORY_CARE_PROVIDER_SITE_OTHER): Payer: Medicare Other | Admitting: Family Medicine

## 2012-12-24 VITALS — BP 120/80 | HR 90 | Temp 98.8°F | Resp 18 | Wt 257.0 lb

## 2012-12-24 DIAGNOSIS — J309 Allergic rhinitis, unspecified: Secondary | ICD-10-CM

## 2012-12-24 MED ORDER — PREDNISONE 20 MG PO TABS: ORAL_TABLET | ORAL | Status: DC

## 2012-12-24 NOTE — Progress Notes (Signed)
Subjective:    Patient ID: Cameron Daniel, male    DOB: [birth year], 67 y.o.   MRN: [id number]  Cough Associated symptoms include wheezing.  Wheezing  Associated symptoms include coughing.    Patient is here today with moderate to severe allergies. He has a history of severe allergies. He takes Allegra 180 mg by mouth daily, Nasacort 2 sprays each nostril daily. 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 Now both eyes are really swollen and puffy. His nasal passages are completely occluded. He has a dry raspy cough. He is reporting some mild shortness of breath and wheezing. Therefore at last office visit he was given a shot of Depo-Medrol. He states that this helped him for one day, but his symptoms have returned. He is currently using Allegra, Nasacort, and Singulair without relief. He denies any fevers or chills. His symptoms have not changed. Past Medical History  Diagnosis Date  . Unspecified essential hypertension   . Other and unspecified hyperlipidemia   . Obesity, unspecified   . Esophageal reflux   . Nocturia   . Flushing   . Other malaise and fatigue   . Type II or unspecified type diabetes mellitus without mention of complication, not stated as uncontrolled   . Allergic rhinitis, cause unspecified   . Other acquired absence of organ   . Psychosexual dysfunction with inhibited sexual excitement   . Irritable bowel syndrome   . Diverticulosis of colon (without mention of hemorrhage)   . Personal history of colonic polyps   . Coronary atherosclerosis of unspecified type of vessel, native or graft     1985 , arthroplasty x 1   . Myocardial infarction   . Depression     PTSD    Current Outpatient Prescriptions on File Prior to Visit  Medication Sig Dispense Refill  . aspirin 81 MG tablet Take 81 mg by mouth every morning.       . cholecalciferol (VITAMIN D) 1000 UNITS tablet  Take 2,000 Units by mouth daily.      . cholestyramine (QUESTRAN) 4 G packet Take 1 packet by mouth 3 (three) times daily with meals.       . citalopram (CELEXA) 10 MG tablet Take 10 mg by mouth daily.      Marland Kitchen COLCRYS 0.6 MG tablet       . fexofenadine (ALLEGRA) 180 MG tablet Take 180 mg by mouth daily as needed. For allergies      . montelukast (SINGULAIR) 10 MG tablet Take 1 tablet (10 mg total) by mouth at bedtime.  30 tablet  3  . Multiple Vitamin (MULTIVITAMIN) tablet Take 1 tablet by mouth daily.       Marland Kitchen olmesartan (BENICAR) 20 MG tablet Take 20 mg by mouth every morning.      . pantoprazole (PROTONIX) 40 MG tablet Take 40 mg by mouth every morning.       . pioglitazone (ACTOS) 15 MG tablet Take 15 mg by mouth every morning.       . simvastatin (ZOCOR) 20 MG tablet Take 20 mg by mouth at bedtime.       . Testosterone (ANDROGEL) 20.25 MG/1.25GM (1.62%) GEL Place onto the skin daily. 2 pumps daily      . triamcinolone (NASACORT) 55 MCG/ACT nasal inhaler Place 2 sprays into the nose daily.       No current facility-administered medications on file prior to visit.  Allergies  Allergen Reactions  . Doxazosin Mesylate     Reaction=unknown  . Penicillins Rash  . Sulfonamide Derivatives Rash     Review of Systems  Respiratory: Positive for cough and wheezing.   All other systems reviewed and are negative.       Objective:   Physical Exam  Constitutional: He appears well-developed and well-nourished.  HENT:  Head: Normocephalic and atraumatic.  Right Ear: Tympanic membrane and ear canal normal.  Left Ear: Tympanic membrane and ear canal normal.  Nose: Mucosal edema and rhinorrhea present. No epistaxis. Right sinus exhibits no maxillary sinus tenderness and no frontal sinus tenderness. Left sinus exhibits no maxillary sinus tenderness and no frontal sinus tenderness.  Mouth/Throat: Oropharynx is clear and moist and mucous membranes are normal.  Cardiovascular: Normal rate,  regular rhythm and normal heart sounds.   Pulmonary/Chest: Effort normal and breath sounds normal. No respiratory distress. He has no wheezes. He has no rales.          Assessment & Plan:  1. Allergic rhinitis Prescribed prednisone dose pack for 20 mg tablets. He is to take 3 tablets on days one and day 2. Take 2 tablets on day 3 and day 4. Take one tablet on day 5 and a 6.  I advised him to continue the Allegra, Nasacort, and Singulair. 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 However I cautioned him the symptoms are likely not improve given his line of work and given the time a year it is.

## 2013-01-06 ENCOUNTER — Other Ambulatory Visit: Payer: Self-pay | Admitting: Family Medicine

## 2013-01-17 ENCOUNTER — Encounter: Payer: Self-pay | Admitting: Internal Medicine

## 2013-01-17 ENCOUNTER — Ambulatory Visit (INDEPENDENT_AMBULATORY_CARE_PROVIDER_SITE_OTHER): Payer: Medicare Other | Admitting: Internal Medicine

## 2013-01-17 VITALS — BP 150/80 | HR 82 | Ht 70.0 in | Wt 255.5 lb

## 2013-01-17 DIAGNOSIS — K219 Gastro-esophageal reflux disease without esophagitis: Secondary | ICD-10-CM

## 2013-01-17 DIAGNOSIS — K589 Irritable bowel syndrome without diarrhea: Secondary | ICD-10-CM

## 2013-01-17 DIAGNOSIS — R1310 Dysphagia, unspecified: Secondary | ICD-10-CM

## 2013-01-17 DIAGNOSIS — K5732 Diverticulitis of large intestine without perforation or abscess without bleeding: Secondary | ICD-10-CM

## 2013-01-17 NOTE — Progress Notes (Signed)
HISTORY OF PRESENT ILLNESS:  Cameron Daniel is a 67 y.o. male with a history of coronary artery disease, hypertension, hyperlipidemia, obesity, GERD, adenomatous colon polyps, diverticulosis, and chronic irritable bowel syndrome manifested by alternating bowel habits and chronic bloating. He presents today to reestablish GI care. He has received significant portions of his care at the [facility]. I last saw the patient in June of 2012 for irritable bowel-type complaints. In March of 2013 he was admitted to the [facility] with acute diverticulitis. He was found to have 3 cm fluid collection. He subsequently underwent colonoscopy 12/14/2011. He was found to have polyps, diverticulosis, and a lipoma. For problems with recurrent diverticulitis, he underwent laparoscopic assisted sigmoid colectomy 03/28/2012. He recovered from surgery. He has had no further problems with diverticulitis. For chronic GERD he takes pantoprazole. Symptoms currently well controlled. He continues to have abdominal bloating with increased gas. He does complain of some intermittent dysphagia and is interested in esophageal dilation, though wants to have this done after upcoming dental work. Bowel habits continue to fluctuate. His chronic medical problems are stable  REVIEW OF SYSTEMS:  All non-GI ROS negative except for sinus and allergy, anxiety, arthritis, hearing problems, visual change  Past Medical History  Diagnosis Date  . Unspecified essential hypertension   . Other and unspecified hyperlipidemia   . Obesity, unspecified   . Esophageal reflux   . Nocturia   . Flushing   . Other malaise and fatigue   . Type II or unspecified type diabetes mellitus without mention of complication, not stated as uncontrolled   . Allergic rhinitis, cause unspecified   . Other acquired absence of organ   . Psychosexual dysfunction with inhibited sexual excitement   . Irritable bowel syndrome   . Diverticulosis of colon  (without mention of hemorrhage)   . Personal history of colonic polyps   . Coronary atherosclerosis of unspecified type of vessel, native or graft     1985 , arthroplasty x 1   . Myocardial infarction   . Depression     PTSD   . Hemorrhoids   . Esophageal stricture     Past Surgical History  Procedure Laterality Date  . Elbow bursa surgery    . Cholecystectomy    . Shoulder surgery      Rt. Shoulder  . Carpal tunnel release      left   . Coronary angioplasty      1985   . Colon surgery  2013    Social History [name]  reports that he quit smoking about 29 years ago. He has quit using smokeless tobacco. He reports that  drinks alcohol. He reports that he does not use illicit drugs.  family history includes Cancer in his father; Coronary artery disease in his mother; and Lung cancer in his father.  There is no history of Colon cancer.  Allergies  Allergen Reactions  . Doxazosin Mesylate     Reaction=unknown  . Penicillins Rash  . Sulfonamide Derivatives Rash       PHYSICAL EXAMINATION:  Vital signs: BP 150/80  Pulse 82  Ht 5\' 10"  (1.778 m)  Wt 255 lb 8 oz (115.894 kg)  BMI 36.66 kg/m2 General: Well-developed, well-nourished, no acute distress HEENT: Sclerae are anicteric, conjunctiva pink. Oral mucosa intact Lungs: Clear Heart: Regular Abdomen: soft, obese, nontender, nondistended, no obvious ascites, no peritoneal signs, normal bowel sounds. No organomegaly. Surgical incisions well-healed Extremities: No edema Psychiatric: alert and oriented x3. Cooperative  ASSESSMENT:  #1. Dysphagia. Likely due to peptic stricture #2. GERD. Chronic. Classic symptoms controlled with pantoprazole #3. History of diverticulitis status post sigmoid colectomy 03/28/2012 #4. History of adenomatous colon polyps. Last colonoscopy April 2013 #5. IBS. Ongoing   PLAN:  #1. Reflux precautions #2. Continue PPI #3. Schedule upper endoscopy with esophageal dilation  that his nearest convenience.The nature of the procedure, as well as the risks, benefits, and alternatives were carefully and thoroughly reviewed with the patient. Ample time for discussion and questions allowed. The patient understood, was satisfied, and agreed to proceed. #4. Provided samples of probiotic for complaints of increased gas #5. Surveillance colonoscopy around April 2018

## 2013-01-17 NOTE — Patient Instructions (Addendum)
You have been given some samples of Restora, a probiotic that puts good bacteria back into your colon.  You may also use Align, over the counter.  You have also been given some information on diverticulosis and diverticulitis

## 2013-03-12 ENCOUNTER — Encounter (HOSPITAL_COMMUNITY): Payer: Self-pay

## 2013-03-12 ENCOUNTER — Telehealth: Payer: Self-pay | Admitting: Internal Medicine

## 2013-03-12 ENCOUNTER — Emergency Department (HOSPITAL_COMMUNITY)
Admission: EM | Admit: 2013-03-12 | Discharge: 2013-03-12 | Disposition: A | Discharge status: Home / Self Care | Payer: Medicare Other | Attending: Emergency Medicine | Admitting: Emergency Medicine

## 2013-03-12 ENCOUNTER — Telehealth: Payer: Self-pay | Admitting: Family Medicine

## 2013-03-12 ENCOUNTER — Emergency Department (HOSPITAL_COMMUNITY): Payer: Medicare Other

## 2013-03-12 DIAGNOSIS — Z7982 Long term (current) use of aspirin: Secondary | ICD-10-CM | POA: Insufficient documentation

## 2013-03-12 DIAGNOSIS — K219 Gastro-esophageal reflux disease without esophagitis: Secondary | ICD-10-CM | POA: Insufficient documentation

## 2013-03-12 DIAGNOSIS — Z8679 Personal history of other diseases of the circulatory system: Secondary | ICD-10-CM | POA: Insufficient documentation

## 2013-03-12 DIAGNOSIS — E119 Type 2 diabetes mellitus without complications: Secondary | ICD-10-CM | POA: Insufficient documentation

## 2013-03-12 DIAGNOSIS — I2510 Atherosclerotic heart disease of native coronary artery without angina pectoris: Secondary | ICD-10-CM | POA: Insufficient documentation

## 2013-03-12 DIAGNOSIS — I10 Essential (primary) hypertension: Secondary | ICD-10-CM | POA: Insufficient documentation

## 2013-03-12 DIAGNOSIS — I252 Old myocardial infarction: Secondary | ICD-10-CM | POA: Insufficient documentation

## 2013-03-12 DIAGNOSIS — Z87448 Personal history of other diseases of urinary system: Secondary | ICD-10-CM | POA: Insufficient documentation

## 2013-03-12 DIAGNOSIS — E785 Hyperlipidemia, unspecified: Secondary | ICD-10-CM | POA: Insufficient documentation

## 2013-03-12 DIAGNOSIS — Z79899 Other long term (current) drug therapy: Secondary | ICD-10-CM | POA: Insufficient documentation

## 2013-03-12 DIAGNOSIS — Z860100 Personal history of colon polyps, unspecified: Secondary | ICD-10-CM | POA: Insufficient documentation

## 2013-03-12 DIAGNOSIS — E669 Obesity, unspecified: Secondary | ICD-10-CM | POA: Insufficient documentation

## 2013-03-12 DIAGNOSIS — F329 Major depressive disorder, single episode, unspecified: Secondary | ICD-10-CM | POA: Insufficient documentation

## 2013-03-12 DIAGNOSIS — Z8709 Personal history of other diseases of the respiratory system: Secondary | ICD-10-CM | POA: Insufficient documentation

## 2013-03-12 DIAGNOSIS — Z9089 Acquired absence of other organs: Secondary | ICD-10-CM | POA: Insufficient documentation

## 2013-03-12 DIAGNOSIS — Z880 Allergy status to penicillin: Secondary | ICD-10-CM | POA: Insufficient documentation

## 2013-03-12 DIAGNOSIS — Z8719 Personal history of other diseases of the digestive system: Secondary | ICD-10-CM | POA: Insufficient documentation

## 2013-03-12 DIAGNOSIS — K5900 Constipation, unspecified: Secondary | ICD-10-CM | POA: Insufficient documentation

## 2013-03-12 DIAGNOSIS — Z87891 Personal history of nicotine dependence: Secondary | ICD-10-CM | POA: Insufficient documentation

## 2013-03-12 DIAGNOSIS — R109 Unspecified abdominal pain: Secondary | ICD-10-CM | POA: Insufficient documentation

## 2013-03-12 DIAGNOSIS — F3289 Other specified depressive episodes: Secondary | ICD-10-CM | POA: Insufficient documentation

## 2013-03-12 LAB — URINALYSIS, ROUTINE W REFLEX MICROSCOPIC
Hgb urine dipstick: NEGATIVE
Nitrite: NEGATIVE
Protein, ur: NEGATIVE mg/dL
Specific Gravity, Urine: 1.029 (ref 1.005–1.030)
Urobilinogen, UA: 0.2 mg/dL (ref 0.0–1.0)

## 2013-03-12 LAB — CBC WITH DIFFERENTIAL/PLATELET
Basophils Relative: 0 % (ref 0–1)
Eosinophils Absolute: 0.1 10*3/uL (ref 0.0–0.7)
Eosinophils Relative: 1 % (ref 0–5)
MCH: 29.7 pg (ref 26.0–34.0)
MCHC: 33.5 g/dL (ref 30.0–36.0)
MCV: 88.4 fL (ref 78.0–100.0)
Neutrophils Relative %: 70 % (ref 43–77)
Platelets: 190 10*3/uL (ref 150–400)
RDW: 14.4 % (ref 11.5–15.5)

## 2013-03-12 LAB — BASIC METABOLIC PANEL
Calcium: 10.5 mg/dL (ref 8.4–10.5)
GFR calc Af Amer: >90 mL/min (ref >90)
GFR calc non Af Amer: 88 mL/min — ABNORMAL LOW (ref >90)
Glucose, Bld: 152 mg/dL — ABNORMAL HIGH (ref 70–99)
Potassium: 3.8 mEq/L (ref 3.5–5.1)
Sodium: 135 mEq/L (ref 135–145)

## 2013-03-12 MED ORDER — POLYETHYLENE GLYCOL 3350 17 GM/SCOOP PO POWD: 17.0000 g | Freq: Every day | ORAL | Status: DC

## 2013-03-12 NOTE — Telephone Encounter (Signed)
Patient's wife called back he is in the ER.  They will call back if they need GI

## 2013-03-12 NOTE — ED Provider Notes (Signed)
History    CSN: [id number] Arrival date & time 03/12/13  1514  First MD Initiated Contact with Patient 03/12/13 1609     Chief Complaint  Patient presents with  . Abdominal Pain   (Consider location/radiation/quality/duration/timing/severity/associated sxs/prior Treatment) Patient is a 67 y.o. male presenting with abdominal pain. The history is provided by the patient.  Abdominal Pain This is a new problem. Associated symptoms include abdominal pain. Pertinent negatives include no chest pain, no headaches and no shortness of breath.   patient has had some lower abdominal pain for the last 3 days. Has a history of a partial colectomy for "leaking". He states he normally does not have a problem with constipation but states she's had worked to have bowel movements. No fevers. His abdomen is not on any order. No nausea vomiting. No blood in stool or emesis. No weight loss. No weight gain. No trauma. Past Medical History  Diagnosis Date  . Unspecified essential hypertension   . Other and unspecified hyperlipidemia   . Obesity, unspecified   . Esophageal reflux   . Nocturia   . Flushing   . Other malaise and fatigue   . Type II or unspecified type diabetes mellitus without mention of complication, not stated as uncontrolled   . Allergic rhinitis, cause unspecified   . Other acquired absence of organ   . Psychosexual dysfunction with inhibited sexual excitement   . Irritable bowel syndrome   . Diverticulosis of colon (without mention of hemorrhage)   . Personal history of colonic polyps   . Coronary atherosclerosis of unspecified type of vessel, native or graft     1985 , arthroplasty x 1   . Myocardial infarction   . Depression     PTSD   . Hemorrhoids   . Esophageal stricture    Past Surgical History  Procedure Laterality Date  . Elbow bursa surgery    . Cholecystectomy    . Shoulder surgery      Rt. Shoulder  . Carpal tunnel release      left   . Coronary angioplasty       1985   . Colon surgery  2013   Family History  Problem Relation Age of Onset  . Lung cancer Father   . Cancer Father     Lung  . Coronary artery disease Mother   . Colon cancer Neg Hx    History  Substance Use Topics  . Smoking status: Former Smoker    Quit date: 08/30/1983  . Smokeless tobacco: Former Neurosurgeon  . Alcohol Use: Yes     Comment: rare    Review of Systems  Constitutional: Negative for activity change and appetite change.  HENT: Negative for neck stiffness.   Eyes: Negative for pain.  Respiratory: Negative for chest tightness and shortness of breath.   Cardiovascular: Negative for chest pain and leg swelling.  Gastrointestinal: Positive for abdominal pain and constipation. Negative for nausea, vomiting and diarrhea.  Genitourinary: Negative for flank pain.  Musculoskeletal: Negative for back pain.  Skin: Negative for rash.  Neurological: Negative for weakness, numbness and headaches.  Psychiatric/Behavioral: Negative for behavioral problems.    Allergies  Doxazosin mesylate; Penicillins; and Sulfonamide derivatives  Home Medications   Current Outpatient Rx  Name  Route  Sig  Dispense  Refill  . aspirin 81 MG tablet   Oral   Take 81 mg by mouth every morning.          . cholecalciferol (VITAMIN D) 1000  UNITS tablet   Oral   Take 2,000 Units by mouth daily.         . cholestyramine (QUESTRAN) 4 G packet   Oral   Take 1 packet by mouth 3 (three) times daily with meals.          . citalopram (CELEXA) 10 MG tablet   Oral   Take 10 mg by mouth daily.         . fexofenadine (ALLEGRA) 180 MG tablet   Oral   Take 180 mg by mouth daily as needed. For allergies         . montelukast (SINGULAIR) 10 MG tablet   Oral   Take 1 tablet (10 mg total) by mouth at bedtime.   30 tablet   3   . Multiple Vitamin (MULTIVITAMIN) tablet   Oral   Take 1 tablet by mouth daily.          Marland Kitchen olmesartan (BENICAR) 20 MG tablet   Oral   Take 20 mg by  mouth every morning.         . pantoprazole (PROTONIX) 40 MG tablet   Oral   Take 40 mg by mouth every morning.          Marland Kitchen PATADAY 0.2 % SOLN      INSTILL ONE DROP INTO EACH EYE DAILY AS NEEDED FOR ITCH.   1 Bottle   5   . pioglitazone (ACTOS) 15 MG tablet   Oral   Take 15 mg by mouth every morning.          . simvastatin (ZOCOR) 20 MG tablet   Oral   Take 20 mg by mouth at bedtime.          . Testosterone (ANDROGEL) 20.25 MG/1.25GM (1.62%) GEL   Transdermal   Place onto the skin daily. 2 pumps daily         . triamcinolone (NASACORT) 55 MCG/ACT nasal inhaler   Nasal   Place 2 sprays into the nose daily.         . polyethylene glycol powder (GLYCOLAX/MIRALAX) powder   Oral   Take 17 g by mouth daily.   255 g   0    BP 132/83  Pulse 87  Temp(Src) 98.6 F (37 C) (Oral)  Resp 17  Ht 5\' 11"  (1.803 m)  Wt 245 lb (111.131 kg)  BMI 34.19 kg/m2  SpO2 96% Physical Exam  Nursing note and vitals reviewed. Constitutional: He is oriented to person, place, and time. He appears well-developed and well-nourished.  HENT:  Head: Normocephalic and atraumatic.  Eyes: EOM are normal. Pupils are equal, round, and reactive to light.  Neck: Normal range of motion. Neck supple.  Cardiovascular: Normal rate, regular rhythm and normal heart sounds.   No murmur heard. Pulmonary/Chest: Effort normal and breath sounds normal.  Abdominal: Soft. Bowel sounds are normal. He exhibits no distension and no mass. There is tenderness. There is no rebound and no guarding.  Postsurgical scar inferior to umbilicus. Minimal lower abdominal tenderness without rebound or guarding.  Musculoskeletal: Normal range of motion. He exhibits no edema.  Neurological: He is alert and oriented to person, place, and time. No cranial nerve deficit.  Skin: Skin is warm and dry.  Psychiatric: He has a normal mood and affect.    ED Course  Procedures (including critical care time) Labs Reviewed  CBC  WITH DIFFERENTIAL - Abnormal; Notable for the following:    WBC 11.5 (*)  Neutro Abs 8.1 (*)    All other components within normal limits  BASIC METABOLIC PANEL - Abnormal; Notable for the following:    Glucose, Bld 152 (*)    GFR calc non Af Amer 88 (*)    All other components within normal limits  URINALYSIS, ROUTINE W REFLEX MICROSCOPIC - Abnormal; Notable for the following:    Bilirubin Urine SMALL (*)    All other components within normal limits   Dg Abd 2 Views  03/12/2013   *RADIOLOGY REPORT*  Clinical Data: Lower abdominal pain and constipation.  ABDOMEN - 2 VIEW  Comparison: 04/01/2012  Findings: No evidence for free air on the upright view.  Surgical clips in the right upper abdomen.  There is stool in the right colon.  Nonobstructive bowel gas pattern.  Multiple phleboliths in the pelvis.  IMPRESSION: Nonspecific bowel gas pattern.  Stool along the right side of the colon.   Original Report Authenticated By: Richarda Overlie, M.D.   1. Abdominal pain   2. Constipation     MDM  Patient with lower abdominal pain and constipation. States his stools or the normal. Minimal tenderness on exam. White count is barely elevated. I doubt  severe intra-abdominal pathology at this time. If she develops fever she will return. he was given MiraLAX and will followup as needed  Juliet Rude. Rubin Payor, MD 03/12/13 [phone]

## 2013-03-12 NOTE — ED Notes (Signed)
Pt c/o lower abdominal pain x3days, states had half his colon removed 21yr ago. Pt states is constipated, was able to pass stool that was hard.

## 2013-03-12 NOTE — Telephone Encounter (Signed)
Patient C/O severe abdominal pain.  Difficulty passing stool last two days.  States abd pain is a 8 out of 10.  Reports history of extensive bowel surgery last year and is concerned something has happened down there.  With no available appointments to offer patient and his high pain level and history, I have advised him to go to the Emergency Room for evaluation.

## 2013-03-12 NOTE — Telephone Encounter (Signed)
Left message for patient to call back  

## 2013-03-22 ENCOUNTER — Ambulatory Visit (INDEPENDENT_AMBULATORY_CARE_PROVIDER_SITE_OTHER): Payer: Medicare Other | Admitting: Family Medicine

## 2013-03-22 ENCOUNTER — Encounter: Payer: Self-pay | Admitting: Family Medicine

## 2013-03-22 VITALS — BP 136/92 | HR 98 | Temp 98.3°F | Resp 18 | Wt 255.0 lb

## 2013-03-22 DIAGNOSIS — IMO0001 Reserved for inherently not codable concepts without codable children: Secondary | ICD-10-CM

## 2013-03-22 DIAGNOSIS — M791 Myalgia, unspecified site: Secondary | ICD-10-CM

## 2013-03-22 DIAGNOSIS — I10 Essential (primary) hypertension: Secondary | ICD-10-CM

## 2013-03-22 DIAGNOSIS — E119 Type 2 diabetes mellitus without complications: Secondary | ICD-10-CM

## 2013-03-22 DIAGNOSIS — E785 Hyperlipidemia, unspecified: Secondary | ICD-10-CM

## 2013-03-22 DIAGNOSIS — Z125 Encounter for screening for malignant neoplasm of prostate: Secondary | ICD-10-CM

## 2013-03-22 NOTE — Progress Notes (Signed)
Subjective:    Patient ID: GWIN EAGON, male    DOB: [birth year], 67 y.o.   MRN: [id number]  HPI  Patient is a very pleasant 67 year old white male who comes in today complaining of diffuse myalgias. He is complaining of pain in his calves, thighs, upper arms, and lower back. He is also complaining of arthralgias in his ankles elbows and wrists. He states this is been going on now for approximately one month. He denies any tick bites or rashes. He denies any fevers or weight loss. 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  Past medical history is significant for hyperlipidemia which is currently being treated with a statin. He denies any headache or vision changes. He denies any pain over the temporal artery.  He also has type 2 diabetes mellitus. His last eye exam was 6 months ago through the [place]. His fasting blood sugars range 120-140. He denies any neuropathy. He denies any polyuria, polydipsia, or blurred vision.  He also has hypertension. He denies any chest pain shortness of breath or dyspnea on exertion. His diastolic blood pressure is elevated today in clinic at 92. However he checks his blood pressure every day and usually ranges 120-130/70-80.    He also has hyperlipidemia with a history of coronary artery disease.  His goal LDL is less than 70. He denies any right upper quadrant pain. He does complain of diffuse myalgias. He is presently on simvastatin 20 mg by mouth daily.  He is overdue for prostate exam.  Past Medical History  Diagnosis Date  . Unspecified essential hypertension   . Other and unspecified hyperlipidemia   . Obesity, unspecified   . Esophageal reflux   . Nocturia   . Flushing   . Other malaise and fatigue   . Type II or unspecified type diabetes mellitus without mention of complication, not stated as uncontrolled   . Allergic rhinitis, cause unspecified   . Other acquired absence of organ   . Psychosexual  dysfunction with inhibited sexual excitement   . Irritable bowel syndrome   . Diverticulosis of colon (without mention of hemorrhage)   . Personal history of colonic polyps   . Coronary atherosclerosis of unspecified type of vessel, native or graft     1985 , arthroplasty x 1   . Myocardial infarction   . Depression     PTSD   . Hemorrhoids   . Esophageal stricture    Past Surgical History  Procedure Laterality Date  . Elbow bursa surgery    . Cholecystectomy    . Shoulder surgery      Rt. Shoulder  . Carpal tunnel release      left   . Coronary angioplasty      1985   . Colon surgery  2013   Current Outpatient Prescriptions on File Prior to Visit  Medication Sig Dispense Refill  . aspirin 81 MG tablet Take 81 mg by mouth every morning.       . cholecalciferol (VITAMIN D) 1000 UNITS tablet Take 2,000 Units by mouth daily.      . cholestyramine (QUESTRAN) 4 G packet Take 1 packet by mouth 3 (three) times daily with meals.       . citalopram (CELEXA) 10 MG tablet Take 10 mg by mouth daily.      . fexofenadine (ALLEGRA) 180 MG tablet Take 180 mg by mouth daily as needed. For allergies      . montelukast (  SINGULAIR) 10 MG tablet Take 1 tablet (10 mg total) by mouth at bedtime.  30 tablet  3  . Multiple Vitamin (MULTIVITAMIN) tablet Take 1 tablet by mouth daily.       Marland Kitchen olmesartan (BENICAR) 20 MG tablet Take 20 mg by mouth every morning.      . pantoprazole (PROTONIX) 40 MG tablet Take 40 mg by mouth every morning.       Marland Kitchen PATADAY 0.2 % SOLN INSTILL ONE DROP INTO EACH EYE DAILY AS NEEDED FOR ITCH.  1 Bottle  5  . pioglitazone (ACTOS) 15 MG tablet Take 15 mg by mouth every morning.       . polyethylene glycol powder (GLYCOLAX/MIRALAX) powder Take 17 g by mouth daily.  255 g  0  . simvastatin (ZOCOR) 20 MG tablet Take 20 mg by mouth at bedtime.       . Testosterone (ANDROGEL) 20.25 MG/1.25GM (1.62%) GEL Place onto the skin daily. 2 pumps daily      . triamcinolone (NASACORT) 55  MCG/ACT nasal inhaler Place 2 sprays into the nose daily.       No current facility-administered medications on file prior to visit.   Allergies  Allergen Reactions  . Doxazosin Mesylate     Reaction=unknown  . Penicillins Rash  . Sulfonamide Derivatives Rash   History   Social History  . Marital Status: Married    Spouse Name: N/A    Number of Children: 1  . 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 Smoking status: Former Smoker    Quit date: 08/30/1983  . Smokeless tobacco: Former Neurosurgeon  . Alcohol Use: Yes     Comment: rare  . Drug Use: No  . Sexually Active: Not on file   Other Topics Concern  . Not on file   Social History Narrative  . No narrative on file   Family History  Problem Relation Age of Onset  . Lung cancer Father   . Cancer Father     Lung  . Coronary artery disease Mother   . Colon cancer Neg Hx      Review of Systems  All other systems reviewed and are negative.       Objective:   Physical Exam  Vitals reviewed. Constitutional: He is oriented to person, place, and time. He appears well-developed and well-nourished.  HENT:  Head: Normocephalic.  Mouth/Throat: Oropharynx is clear and moist. No oropharyngeal exudate.  Eyes: EOM are normal. Pupils are equal, round, and reactive to light. Right conjunctiva is injected. Left conjunctiva is injected.  Neck: Neck supple. No JVD present. No thyromegaly present.  Cardiovascular: Normal rate, regular rhythm, normal heart sounds and intact distal pulses.  Exam reveals no gallop and no friction rub.   No murmur heard. Pulmonary/Chest: Effort normal and breath sounds normal. No respiratory distress. He has no wheezes. He has no rales. He exhibits no tenderness.  Abdominal: Soft. Bowel sounds are normal. He exhibits no distension and no mass. There is no tenderness. There is no rebound and no guarding.  Musculoskeletal:       Right elbow: He exhibits  normal range of motion and no swelling. Tenderness found.       Left elbow: He exhibits normal range of motion, no swelling and no effusion. Tenderness found.       Right knee: He exhibits normal range of motion. No tenderness found.  Left knee: He exhibits normal range of motion and no swelling. No tenderness found.       Right ankle: He exhibits decreased range of motion and swelling. Tenderness.       Left ankle: He exhibits decreased range of motion and swelling. Tenderness.  Lymphadenopathy:    He has no cervical adenopathy.  Neurological: He is alert and oriented to person, place, and time. He displays normal reflexes. No cranial nerve deficit. He exhibits normal muscle tone. Coordination normal.  Skin: Skin is warm. No rash noted. No erythema.          Assessment & Plan:  1. Myalgia I asked the patient to discontinue simvastatin for 2-3 weeks.  If symptoms completely resolve, we will need to find an alternative statin for the patient. If symptoms persist will need began a rheumatologic workup including a sedimentation rate, ANA, Lyme titers, and x-rays of the involved joints. 2. Type II or unspecified type diabetes mellitus without mention of complication, not stated as uncontrolled Past patient to return fasting so that we can check a hemoglobin A1c. His goal A1c is less than 6.5 - COMPLETE METABOLIC PANEL WITH GFR; Future - Hemoglobin A1c; Future - Lipid panel; Future - Microalbumin, urine; Future  3. HTN (hypertension) Pressure today is slightly elevated, but the patient assures me at home is well controlled. Continue his present medications at their current dosages. - COMPLETE METABOLIC PANEL WITH GFR; Future  4. HLD (hyperlipidemia) Go LDL is less than 70. The patient return for a fasting lipid panel. We have temporarily holding his statin medication to see if the myalgias will improve. We will need to find an alternative if the simvastatin is the offending agent. -  COMPLETE METABOLIC PANEL WITH GFR; Future - Lipid panel; Future  5. Prostate cancer screening Check PSA with his upcoming fasting labs. - PSA, Medicare; Future

## 2013-03-25 ENCOUNTER — Other Ambulatory Visit: Payer: Medicare Other

## 2013-03-25 DIAGNOSIS — E119 Type 2 diabetes mellitus without complications: Secondary | ICD-10-CM

## 2013-03-25 DIAGNOSIS — E785 Hyperlipidemia, unspecified: Secondary | ICD-10-CM

## 2013-03-25 DIAGNOSIS — I10 Essential (primary) hypertension: Secondary | ICD-10-CM

## 2013-03-25 DIAGNOSIS — Z125 Encounter for screening for malignant neoplasm of prostate: Secondary | ICD-10-CM

## 2013-03-25 LAB — COMPLETE METABOLIC PANEL WITH GFR
AST: 29 U/L (ref 0–37)
Alkaline Phosphatase: 67 U/L (ref 39–117)
BUN: 11 mg/dL (ref 6–23)
GFR, Est Non African American: 83 mL/min
Glucose, Bld: 151 mg/dL — ABNORMAL HIGH (ref 70–99)
Potassium: 4.1 mEq/L (ref 3.5–5.3)
Sodium: 138 mEq/L (ref 135–145)
Total Bilirubin: 0.6 mg/dL (ref 0.3–1.2)

## 2013-03-25 LAB — HEMOGLOBIN A1C
Hgb A1c MFr Bld: 6.7 % — ABNORMAL HIGH (ref <5.7)
Mean Plasma Glucose: 146 mg/dL — ABNORMAL HIGH (ref <117)

## 2013-03-25 LAB — LIPID PANEL
Cholesterol: 253 mg/dL — ABNORMAL HIGH (ref 0–200)
HDL: 47 mg/dL (ref >39)
Triglycerides: 511 mg/dL — ABNORMAL HIGH (ref <150)

## 2013-03-27 ENCOUNTER — Telehealth: Payer: Self-pay | Admitting: Family Medicine

## 2013-03-28 NOTE — Telephone Encounter (Signed)
Patient aware.

## 2013-03-28 NOTE — Telephone Encounter (Signed)
Yes but will discuss at his ov due to his recent myalgias.

## 2013-04-05 ENCOUNTER — Encounter: Payer: Self-pay | Admitting: Family Medicine

## 2013-04-05 ENCOUNTER — Ambulatory Visit (INDEPENDENT_AMBULATORY_CARE_PROVIDER_SITE_OTHER): Payer: Medicare Other | Admitting: Family Medicine

## 2013-04-05 VITALS — BP 134/88 | HR 78 | Temp 98.0°F | Resp 18 | Wt 251.0 lb

## 2013-04-05 DIAGNOSIS — E785 Hyperlipidemia, unspecified: Secondary | ICD-10-CM

## 2013-04-05 DIAGNOSIS — I10 Essential (primary) hypertension: Secondary | ICD-10-CM

## 2013-04-05 DIAGNOSIS — E119 Type 2 diabetes mellitus without complications: Secondary | ICD-10-CM

## 2013-04-05 NOTE — Progress Notes (Signed)
Subjective:    Patient ID: Cameron Daniel, male    DOB: 05/17/1946, 67 y.o.   MRN: [id number]  HPI  Patient is here today for followup of diabetes mellitus type 2, hyperlipidemia, and hypertension. His past history Scott attended by coronary artery disease. His medication list and past medical history is reviewed. His most recent lab work is listed below: Appointment on 03/25/2013  Component Date Value Range Status  . Sodium 03/25/2013 138  135 - 145 mEq/L Final  . Potassium 03/25/2013 4.1  3.5 - 5.3 mEq/L Final  . Chloride 03/25/2013 100  96 - 112 mEq/L Final  . CO2 03/25/2013 25  19 - 32 mEq/L Final  . Glucose, Bld 03/25/2013 151* 70 - 99 mg/dL Final  . BUN 14/78/2956 11  6 - 23 mg/dL Final  . Creat 21/30/8657 0.95  0.50 - 1.35 mg/dL Final  . Total Bilirubin 03/25/2013 0.6  0.3 - 1.2 mg/dL Final  . Alkaline Phosphatase 03/25/2013 67  39 - 117 U/L Final  . AST 03/25/2013 29  0 - 37 U/L Final  . ALT 03/25/2013 28  0 - 53 U/L Final  . Total Protein 03/25/2013 6.4  6.0 - 8.3 g/dL Final  . Albumin 84/69/6295 4.2  3.5 - 5.2 g/dL Final  . Calcium 28/41/3244 10.1  8.4 - 10.5 mg/dL Final  . GFR, Est African American 03/25/2013 >89   Final  . GFR, Est Non African American 03/25/2013 83   Final   Comment:                            The estimated GFR is a calculation valid for adults (>=3 years old)                          that uses the CKD-EPI algorithm to adjust for age and sex. It is                            not to be used for children, pregnant women, hospitalized patients,                             patients on dialysis, or with rapidly changing kidney function.                          According to the NKDEP, eGFR >89 is normal, 60-89 shows mild                          impairment, 30-59 shows moderate impairment, 15-29 shows severe                          impairment and <15 is ESRD.                             Marland Kitchen Hemoglobin A1C 03/25/2013 6.7* <5.7 % Final   Comment:  According to the ADA Clinical Practice Recommendations for 2011, when                          HbA1c is used as a screening test:                                                       >=6.5%   Diagnostic of Diabetes Mellitus                                     (if abnormal result is confirmed)                                                     5.7-6.4%   Increased risk of developing Diabetes Mellitus                                                     References:Diagnosis and Classification of Diabetes Mellitus,Diabetes                          Care,2011,34(Suppl 1):S62-S69 and Standards of Medical Care in                                  Diabetes - 2011,Diabetes Care,2011,34 (Suppl 1):S11-S61.                             . Mean Plasma Glucose 03/25/2013 146* <117 mg/dL Final  . Cholesterol 47/42/5956 253* 0 - 200 mg/dL Final   Comment: ATP III Classification:                                < 200        mg/dL        Desirable                               200 - 239     mg/dL        Borderline High                               >= 240        mg/dL        High                             . Triglycerides 03/25/2013 511* <150 mg/dL Final  . HDL 38/75/6433 47  >39 mg/dL Final  . Total CHOL/HDL Ratio 03/25/2013 5.4   Final  . VLDL 03/25/2013 NOT CALC  0 - 40 mg/dL Final  Comment:                            Not calculated due to Triglyceride >400.                          Suggest ordering Direct LDL (Unit Code: 09811).  . LDL Cholesterol 03/25/2013    Final   Comment:                            Not calculated due to Triglyceride >400.                          Suggest ordering Direct LDL (Unit Code: 91478).                                                     Total Cholesterol/HDL Ratio:CHD Risk                                                 Coronary Heart Disease Risk Table                                                                  Men       Women                                   1/2 Average Risk              3.4        3.3                                       Average Risk              5.0        4.4                                    2X Average Risk              9.6        7.1                                    3X Average Risk             23.4       11.0                          Use the calculated Patient Ratio above and the CHD Risk table  to determine the patient's CHD Risk.                          ATP III Classification (LDL):                                < 100        mg/dL         Optimal                               100 - 129     mg/dL         Near or Above Optimal                               130 - 159     mg/dL         Borderline High                               160 - 189     mg/dL         High                                > 190        mg/dL         Very High                             . PSA 03/25/2013 2.27  <=4.00 ng/mL Final   Comment: Test Methodology: ECLIA PSA (Electrochemiluminescence Immunoassay)                                                     For PSA values from 2.5-4.0, particularly in younger men <60 years                          old, the AUA and NCCN suggest testing for % Free PSA (3515) and                          evaluation of the rate of increase in PSA (PSA velocity).  Alyson Reedy, Ur 03/25/2013 1.12  0.00 - 1.89 mg/dL Final  Admission on 78/29/5621, Discharged on 03/12/2013  Component Date Value Range Status  . WBC 03/12/2013 11.5* 4.0 - 10.5 K/uL Final  . RBC 03/12/2013 5.26  4.22 - 5.81 MIL/uL Final  . Hemoglobin 03/12/2013 15.6  13.0 - 17.0 g/dL Final  . HCT 30/86/5784 46.5  39.0 - 52.0 % Final  . MCV 03/12/2013 88.4  78.0 - 100.0 fL Final  . MCH 03/12/2013 29.7  26.0 - 34.0 pg Final  . MCHC 03/12/2013 33.5  30.0 - 36.0 g/dL Final  . RDW 69/62/9528 14.4  11.5 - 15.5 % Final  . Platelets 03/12/2013 190  150 -  400 K/uL Final  . Neutrophils Relative % 03/12/2013 70  43 - 77 % Final  .  Neutro Abs 03/12/2013 8.1* 1.7 - 7.7 K/uL Final  . Lymphocytes Relative 03/12/2013 22  12 - 46 % Final  . Lymphs Abs 03/12/2013 2.5  0.7 - 4.0 K/uL Final  . Monocytes Relative 03/12/2013 7  3 - 12 % Final  . Monocytes Absolute 03/12/2013 0.8  0.1 - 1.0 K/uL Final  . Eosinophils Relative 03/12/2013 1  0 - 5 % Final  . Eosinophils Absolute 03/12/2013 0.1  0.0 - 0.7 K/uL Final  . Basophils Relative 03/12/2013 0  0 - 1 % Final  . Basophils Absolute 03/12/2013 0.0  0.0 - 0.1 K/uL Final  . Sodium 03/12/2013 135  135 - 145 mEq/L Final  . Potassium 03/12/2013 3.8  3.5 - 5.1 mEq/L Final  . Chloride 03/12/2013 97  96 - 112 mEq/L Final  . CO2 03/12/2013 25  19 - 32 mEq/L Final  . Glucose, Bld 03/12/2013 152* 70 - 99 mg/dL Final  . BUN 40/98/1191 11  6 - 23 mg/dL Final  . Creatinine, Ser 03/12/2013 0.88  0.50 - 1.35 mg/dL Final  . Calcium 47/82/9562 10.5  8.4 - 10.5 mg/dL Final  . GFR calc non Af Amer 03/12/2013 88* >90 mL/min Final  . GFR calc Af Amer 03/12/2013 >90  >90 mL/min Final   Comment:                                 The eGFR has been calculated                          using the CKD EPI equation.                          This calculation has not been                          validated in all clinical                          situations.                          eGFR's persistently                          <90 mL/min signify                          possible Chronic Kidney Disease.  . Color, Urine 03/12/2013 YELLOW  YELLOW Final  . APPearance 03/12/2013 CLEAR  CLEAR Final  . Specific Gravity, Urine 03/12/2013 1.029  1.005 - 1.030 Final  . pH 03/12/2013 5.5  5.0 - 8.0 Final  . Glucose, UA 03/12/2013 NEGATIVE  NEGATIVE mg/dL Final  . Hgb urine dipstick 03/12/2013 NEGATIVE  NEGATIVE Final  . Bilirubin Urine 03/12/2013 SMALL* NEGATIVE Final  . Ketones, ur 03/12/2013 NEGATIVE  NEGATIVE mg/dL Final  . Protein, ur  13/03/6577 NEGATIVE  NEGATIVE mg/dL Final  . Urobilinogen, UA 03/12/2013 0.2  0.0 - 1.0 mg/dL Final  . Nitrite 46/96/2952 NEGATIVE  NEGATIVE Final  . Leukocytes, UA 03/12/2013 NEGATIVE  NEGATIVE Final   MICROSCOPIC NOT DONE ON URINES WITH NEGATIVE PROTEIN, BLOOD, LEUKOCYTES, NITRITE, OR GLUCOSE <1000 mg/dL.   Fasting lipid panel is significant for triglycerides  of 511. Of note the patient is taking cholestyramine for diarrhea. He has also discontinued his statin due to myalgias. Since discontinuing his statin however the myalgias have not improved. He is willing to resume taking his statin as he admits that the medicine was not causing pain. However he is very hesitant to stop taking cholestyramine he didn't know it is likely causing his elevated triglycerides. He has occasional diarrhea and he states that he does not take the cholestyramine he cannot "function".  His hemoglobin A1c is slightly elevated at 6.7. He is currently taking Actos 15 mg by mouth daily. He denies any polyuria, polydipsia, or blurred vision. He denies any neuropathy in the feet. He admits he's been eating a lot of junk food lately and would like to try to address the sugars with diet. He gets his eyes checked every 6 months. He denies any chest pain, shortness of breath, dyspnea on exertion. His blood pressure today is well controlled. Past Medical History  Diagnosis Date  . Unspecified essential hypertension   . Other and unspecified hyperlipidemia   . Obesity, unspecified   . Esophageal reflux   . Nocturia   . Flushing   . Other malaise and fatigue   . Type II or unspecified type diabetes mellitus without mention of complication, not stated as uncontrolled   . Allergic rhinitis, cause unspecified   . Other acquired absence of organ   . Psychosexual dysfunction with inhibited sexual excitement   . Irritable bowel syndrome   . Diverticulosis of colon (without mention of hemorrhage)   . Personal history of colonic polyps    . Coronary atherosclerosis of unspecified type of vessel, native or graft     1985 , arthroplasty x 1   . Myocardial infarction   . Depression     PTSD   . Hemorrhoids   . Esophageal stricture    Current Outpatient Prescriptions on File Prior to Visit  Medication Sig Dispense Refill  . aspirin 81 MG tablet Take 81 mg by mouth every morning.       . cholecalciferol (VITAMIN D) 1000 UNITS tablet Take 2,000 Units by mouth daily.      . cholestyramine (QUESTRAN) 4 G packet Take 1 packet by mouth 3 (three) times daily with meals.       . citalopram (CELEXA) 10 MG tablet Take 10 mg by mouth daily.      . fexofenadine (ALLEGRA) 180 MG tablet Take 180 mg by mouth daily as needed. For allergies      . montelukast (SINGULAIR) 10 MG tablet Take 1 tablet (10 mg total) by mouth at bedtime.  30 tablet  3  . Multiple Vitamin (MULTIVITAMIN) tablet Take 1 tablet by mouth daily.       Marland Kitchen olmesartan (BENICAR) 20 MG tablet Take 20 mg by mouth every morning.      . pantoprazole (PROTONIX) 40 MG tablet Take 40 mg by mouth every morning.       Marland Kitchen PATADAY 0.2 % SOLN INSTILL ONE DROP INTO EACH EYE DAILY AS NEEDED FOR ITCH.  1 Bottle  5  . pioglitazone (ACTOS) 15 MG tablet Take 15 mg by mouth every morning.       . polyethylene glycol powder (GLYCOLAX/MIRALAX) powder Take 17 g by mouth daily.  255 g  0  . Testosterone (ANDROGEL) 20.25 MG/1.25GM (1.62%) GEL Place onto the skin daily. 2 pumps daily      . triamcinolone (NASACORT) 55 MCG/ACT nasal inhaler Place 2 sprays  into the nose daily.       No current facility-administered medications on file prior to visit.   Allergies  Allergen Reactions  . Doxazosin Mesylate     Reaction=unknown  . Penicillins Rash  . Sulfonamide Derivatives Rash   History   Social History  . Marital Status: Married    Spouse Name: N/A    Number of Children: 1  . 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 Smoking status: Former  Smoker    Quit date: 08/30/1983  . Smokeless tobacco: Former Neurosurgeon  . Alcohol Use: Yes     Comment: rare  . Drug Use: No  . Sexually Active: Not on file   Other Topics Concern  . Not on file   Social History Narrative  . No narrative on file      Review of Systems  All other systems reviewed and are negative.       Objective:   Physical Exam  Vitals reviewed. Constitutional: He is oriented to person, place, and time. He appears well-developed and well-nourished. No distress.  HENT:  Head: Normocephalic.  Right Ear: External ear normal.  Left Ear: External ear normal.  Nose: Nose normal.  Mouth/Throat: Oropharynx is clear and moist. No oropharyngeal exudate.  Neck: Neck supple. No JVD present. No thyromegaly present.  Cardiovascular: Normal rate, regular rhythm and intact distal pulses.  Exam reveals no gallop and no friction rub.   No murmur heard. Pulmonary/Chest: Effort normal and breath sounds normal. No respiratory distress. He has no wheezes. He has no rales. He exhibits no tenderness.  Abdominal: Soft. Bowel sounds are normal. He exhibits no distension. There is no tenderness. There is no rebound and no guarding.  Lymphadenopathy:    He has no cervical adenopathy.  Neurological: He is alert and oriented to person, place, and time. He has normal reflexes.  Skin: Skin is warm. No rash noted. He is not diaphoretic. No erythema.          Assessment & Plan:  1. HYPERLIPIDEMIA I suggested the patient to discontinue Questran. I recommended fish oil 2 g by mouth daily. I recommended he resume his statin medication. Recheck fasting lipid panel in 3 months.  2. DIABETES MELLITUS, TYPE II Hemoglobin A1c is acceptable. The patient like to try to decrease carbs in his diet and achieve weight loss. Recheck hemoglobin A1c in 3 months.  3. HYPERTENSION Blood pressure is well controlled. Continue current medications at the present dosages.

## 2013-04-25 ENCOUNTER — Telehealth: Payer: Self-pay | Admitting: Family Medicine

## 2013-04-25 MED ORDER — MELOXICAM 15 MG PO TABS: 15.0000 mg | ORAL_TABLET | Freq: Every day | ORAL | Status: DC

## 2013-04-25 NOTE — Telephone Encounter (Signed)
Rx Refilled  

## 2013-04-25 NOTE — Telephone Encounter (Signed)
mobic 15 poqday prn joint pain 30 rf =1

## 2013-04-25 NOTE — Telephone Encounter (Signed)
.?   OK to Refill  - If ok what dose cause i do not see in chart?

## 2013-05-01 ENCOUNTER — Encounter: Payer: Self-pay | Admitting: *Deleted

## 2013-05-06 ENCOUNTER — Encounter: Payer: Self-pay | Admitting: Cardiology

## 2013-05-06 ENCOUNTER — Ambulatory Visit (INDEPENDENT_AMBULATORY_CARE_PROVIDER_SITE_OTHER): Payer: Medicare Other | Admitting: Cardiology

## 2013-05-06 ENCOUNTER — Other Ambulatory Visit: Payer: Self-pay

## 2013-05-06 VITALS — BP 142/90 | HR 70 | Ht 71.0 in | Wt 251.0 lb

## 2013-05-06 DIAGNOSIS — Z136 Encounter for screening for cardiovascular disorders: Secondary | ICD-10-CM

## 2013-05-06 DIAGNOSIS — E785 Hyperlipidemia, unspecified: Secondary | ICD-10-CM

## 2013-05-06 DIAGNOSIS — I2510 Atherosclerotic heart disease of native coronary artery without angina pectoris: Secondary | ICD-10-CM

## 2013-05-06 MED ORDER — ATORVASTATIN CALCIUM 10 MG PO TABS: 10.0000 mg | ORAL_TABLET | Freq: Every day | ORAL | Status: DC

## 2013-05-06 NOTE — Progress Notes (Signed)
Patient ID: AFNAN EMBERTON, male   DOB: Jan 23, 1946, 67 y.o.   MRN: [id number]     [name] Date of Encounter: 05/06/2013  Primary Care Provider:  Leo Grosser, MD Primary Cardiologist:  Tobias Alexander, MD   Patient Profile Re-establish cardiology care  Problem List   Past Medical History  Diagnosis Date  . Unspecified essential hypertension   . Other and unspecified hyperlipidemia   . Obesity, unspecified   . Esophageal reflux   . Nocturia   . Flushing   . Other malaise and fatigue   . Type II or unspecified type diabetes mellitus without mention of complication, not stated as uncontrolled   . Allergic rhinitis, cause unspecified   . Other acquired absence of organ   . Psychosexual dysfunction with inhibited sexual excitement   . Irritable bowel syndrome   . Diverticulosis of colon (without mention of hemorrhage)   . Personal history of colonic polyps   . Coronary atherosclerosis of unspecified type of vessel, native or graft     1985 , arthroplasty x 1   . Myocardial infarction   . Depression     PTSD   . Hemorrhoids   . Esophageal stricture    Past Surgical History  Procedure Laterality Date  . Elbow bursa surgery    . Cholecystectomy    . Shoulder surgery      Rt. Shoulder  . Carpal tunnel release      left   . Coronary angioplasty      1985   . Colon surgery  2013    Allergies  Allergies  Allergen Reactions  . Doxazosin Mesylate     Reaction=unknown  . Penicillins Rash  . Sulfonamide Derivatives Rash    HPI  67 year old male with h/o MI in 1985, HTN, hyperlipidemia, NIDDM, former smoking who is a former patient of Dr. Riley Kill. He was lost for follow up since 2009 when he lost his insurance and was followed at [place]. He denies any CP, SOB, dizziness, palpiations, syncope. He had a nuclear stress test 3 years ago that was unchanged from prior (per patient) and no intervention was done. 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   Home Medications  Prior to Admission medications   Medication Sig Start Date End Date Taking? Authorizing Provider  aspirin 81 MG tablet Take 81 mg by mouth every morning.    Yes Historical Provider, MD  cholecalciferol (VITAMIN D) 1000 UNITS tablet Take 2,000 Units by mouth daily.   Yes Historical Provider, MD  citalopram (CELEXA) 10 MG tablet Take 20 mg by mouth daily.    Yes Historical Provider, MD  colestipol (COLESTID) 5 G granules Take 5 g by mouth daily.   Yes Historical Provider, MD  fexofenadine (ALLEGRA) 180 MG tablet Take 180 mg by mouth daily as needed. For allergies   Yes Historical Provider, MD  fish oil-omega-3 fatty acids 1000 MG capsule Take 500 mg by mouth 2 (two) times daily.   Yes Historical Provider, MD  meloxicam (MOBIC) 15 MG tablet Take 1 tablet (15 mg total) by mouth daily. 04/25/13  Yes Donita Brooks, MD  montelukast (SINGULAIR) 10 MG tablet Take 1 tablet (10 mg total) by mouth at bedtime. 12/20/12  Yes Donita Brooks, MD  Multiple Vitamin (MULTIVITAMIN) tablet Take 1 tablet by mouth daily.    Yes Historical Provider, MD  olmesartan (BENICAR) 20 MG tablet Take 20 mg by mouth every  morning.   Yes Historical Provider, MD  pantoprazole (PROTONIX) 40 MG tablet Take 40 mg by mouth every morning.    Yes Historical Provider, MD  PATADAY 0.2 % SOLN INSTILL ONE DROP INTO EACH EYE DAILY AS NEEDED FOR ITCH. 01/06/13  Yes Donita Brooks, MD  pioglitazone (ACTOS) 15 MG tablet Take 15 mg by mouth every morning.    Yes Historical Provider, MD  polyethylene glycol powder (GLYCOLAX/MIRALAX) powder Take 17 g by mouth daily. 03/12/13  Yes Juliet Rude. Pickering, MD  simvastatin (ZOCOR) 20 MG tablet Take 30 mg by mouth every evening.   Yes Historical Provider, MD  Testosterone (ANDROGEL) 20.25 MG/1.25GM (1.62%) GEL Place onto the skin daily. 2 pumps daily   Yes Historical Provider, MD  triamcinolone (NASACORT) 55 MCG/ACT nasal inhaler Place 2 sprays  into the nose daily.   Yes Historical Provider, MD    Family History  Family History  Problem Relation Age of Onset  . Lung cancer Father   . Coronary artery disease Mother   . Colon cancer Neg Hx     Social History  History   Social History  . Marital Status: Married    Spouse Name: N/A    Number of Children: 1  . 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 Smoking status: Former Smoker    Quit date: 08/30/1983  . Smokeless tobacco: Former Neurosurgeon  . Alcohol Use: Yes     Comment: rare  . Drug Use: No  . Sexual Activity: Not on file   Other Topics Concern  . Not on file   Social History Narrative  . No narrative on file     Review of Systems General:  No chills, fever, night sweats or weight changes.  Cardiovascular:  No chest pain, dyspnea on exertion, edema, orthopnea, palpitations, paroxysmal nocturnal dyspnea. Dermatological: No rash, lesions/masses Respiratory: No cough, dyspnea Urologic: No hematuria, dysuria Abdominal:   No nausea, vomiting, diarrhea, bright red blood per rectum, melena, or hematemesis Neurologic:  No visual changes, wkns, changes in mental status. All other systems reviewed and are otherwise negative except as noted above.  Physical Exam  Blood pressure 142/90, pulse 70, height 5\' 11"  (1.803 m), weight 251 lb (113.853 kg).  General: Pleasant, NAD Psych: Normal affect. Neuro: Alert and oriented X 3. Moves all extremities spontaneously. HEENT: Normal  Neck: Supple without bruits or JVD. Lungs:  Resp regular and unlabored, CTA. Heart: RRR no s3, s4, or murmurs. Abdomen: Soft, non-tender, non-distended, BS + x 4.  Extremities: No clubbing, cyanosis or edema. DP/PT/Radials 2+ and equal bilaterally.  Accessory Clinical Findings  ECG - SR, 70/min, LAD,PR , QRS 98ms, QT 398 ms, prior inferior MI, no ST - T wave abnormalities  Assessment & Plan  67 yearold male who was followed in our  clinic for years and is coming back for re-establishing cardiology care  1. CAD - currently asymptomatic, we will continue with Aspirin, BB, ARB, statin  We will consider stress test if any change in status  2. Hypertension - elevated in the office (repeated 150/92), the patient was asked to bring a diary of [facility] records for a month - we will re-evaluate in 1 month and adjust BP meds   3. Hyperlipidemia - very elevated TAG and LDL in July 2014, per patient he was not NPO and binging. We will change Simvastatin to Lipitor and recheck lipid profile in 1 month. If  still elevated in 1 month we will increase Lipitor dose  4. NIDDM - elevated HbA1c - the patient will follow with PCP  5. Screening for AAA - male > 65 years, former smoker  Follow up in 1 month with BP diary and lipid profile re-chcek    Tobias Alexander, Rexene Edison, MD 05/06/2013, 10:42 AM

## 2013-05-06 NOTE — Patient Instructions (Addendum)
Schedule Abdominal Ultrasound for screening for AAA   Stop Zocor  Start Lipitor 10 mg after evening meal  Bring copy of cholesterol recently done at [facility]   Your physician recommends that you schedule a follow-up appointment in: 1 month need to schedule fasting lipid panel 1 week before follow up

## 2013-05-13 ENCOUNTER — Encounter (INDEPENDENT_AMBULATORY_CARE_PROVIDER_SITE_OTHER): Payer: Medicare Other

## 2013-05-13 DIAGNOSIS — I10 Essential (primary) hypertension: Secondary | ICD-10-CM

## 2013-05-13 DIAGNOSIS — Z136 Encounter for screening for cardiovascular disorders: Secondary | ICD-10-CM

## 2013-05-15 ENCOUNTER — Telehealth: Payer: Self-pay | Admitting: Family Medicine

## 2013-05-15 NOTE — Telephone Encounter (Signed)
Pt went to the [place] on 05/02/13 and had labs done. Here are the results.   Total Cholesterol - 162 Triglycerides - 273 LDL - 71 HDL - 53 Hemoglobin - 6.8 Vit D was low PSA was normal (he thinks it was 2.02)

## 2013-05-16 NOTE — Telephone Encounter (Signed)
Trigs are much better.

## 2013-05-27 ENCOUNTER — Other Ambulatory Visit (INDEPENDENT_AMBULATORY_CARE_PROVIDER_SITE_OTHER): Payer: Medicare Other

## 2013-05-27 DIAGNOSIS — Z136 Encounter for screening for cardiovascular disorders: Secondary | ICD-10-CM

## 2013-05-27 DIAGNOSIS — I2510 Atherosclerotic heart disease of native coronary artery without angina pectoris: Secondary | ICD-10-CM

## 2013-05-27 DIAGNOSIS — E785 Hyperlipidemia, unspecified: Secondary | ICD-10-CM

## 2013-05-27 LAB — LIPID PANEL
Cholesterol: 147 mg/dL (ref 0–200)
HDL: 57.3 mg/dL (ref >39.00)
Total CHOL/HDL Ratio: 3
Triglycerides: 335 mg/dL — ABNORMAL HIGH (ref 0.0–149.0)
VLDL: 67 mg/dL — ABNORMAL HIGH (ref 0.0–40.0)

## 2013-05-27 LAB — LDL CHOLESTEROL, DIRECT: Direct LDL: 50.2 mg/dL

## 2013-05-28 ENCOUNTER — Other Ambulatory Visit: Payer: Self-pay

## 2013-05-28 ENCOUNTER — Ambulatory Visit (INDEPENDENT_AMBULATORY_CARE_PROVIDER_SITE_OTHER): Payer: Medicare Other | Admitting: *Deleted

## 2013-05-28 DIAGNOSIS — Z23 Encounter for immunization: Secondary | ICD-10-CM

## 2013-05-28 DIAGNOSIS — E785 Hyperlipidemia, unspecified: Secondary | ICD-10-CM

## 2013-05-28 DIAGNOSIS — Z136 Encounter for screening for cardiovascular disorders: Secondary | ICD-10-CM

## 2013-05-28 DIAGNOSIS — I2510 Atherosclerotic heart disease of native coronary artery without angina pectoris: Secondary | ICD-10-CM

## 2013-05-28 MED ORDER — ATORVASTATIN CALCIUM 40 MG PO TABS: 40.0000 mg | ORAL_TABLET | Freq: Every day | ORAL | Status: DC

## 2013-06-05 ENCOUNTER — Ambulatory Visit (INDEPENDENT_AMBULATORY_CARE_PROVIDER_SITE_OTHER): Payer: Medicare Other | Admitting: Cardiology

## 2013-06-05 ENCOUNTER — Encounter: Payer: Self-pay | Admitting: Cardiology

## 2013-06-05 VITALS — BP 133/73 | HR 75 | Ht 71.0 in | Wt 247.0 lb

## 2013-06-05 DIAGNOSIS — I10 Essential (primary) hypertension: Secondary | ICD-10-CM

## 2013-06-05 DIAGNOSIS — E785 Hyperlipidemia, unspecified: Secondary | ICD-10-CM

## 2013-06-05 DIAGNOSIS — Z136 Encounter for screening for cardiovascular disorders: Secondary | ICD-10-CM

## 2013-06-05 DIAGNOSIS — I2510 Atherosclerotic heart disease of native coronary artery without angina pectoris: Secondary | ICD-10-CM

## 2013-06-05 MED ORDER — OLMESARTAN MEDOXOMIL 40 MG PO TABS: 40.0000 mg | ORAL_TABLET | Freq: Every morning | ORAL | Status: DC

## 2013-06-05 MED ORDER — ATORVASTATIN CALCIUM 20 MG PO TABS: 20.0000 mg | ORAL_TABLET | Freq: Every day | ORAL | Status: DC

## 2013-06-05 NOTE — Patient Instructions (Addendum)
**Note De-Identified Cameron Daniel Obfuscation** Your physician has recommended you make the following change in your medication: Decrease Lipitor to 20 mg at bedtime and increase Benicar to 40 mg daily  Your physician wants you to follow-up in: 1 year. You will receive a reminder letter in the mail two months in advance. If you don't receive a letter, please call our office to schedule the follow-up appointment.

## 2013-06-05 NOTE — Progress Notes (Signed)
Patient ID: VERNER MCCRONE, male   DOB: Oct 28, 1945, 67 y.o.   MRN: [id number]     [name] Date of Encounter: 06/05/2013  Primary Care Provider:  Leo Grosser, MD Primary Cardiologist:  Tobias Alexander, MD   Patient Profile Re-establish cardiology care  Problem List   Past Medical History  Diagnosis Date  . Unspecified essential hypertension   . Other and unspecified hyperlipidemia   . Obesity, unspecified   . Esophageal reflux   . Nocturia   . Flushing   . Other malaise and fatigue   . Type II or unspecified type diabetes mellitus without mention of complication, not stated as uncontrolled   . Allergic rhinitis, cause unspecified   . Other acquired absence of organ   . Psychosexual dysfunction with inhibited sexual excitement   . Irritable bowel syndrome   . Diverticulosis of colon (without mention of hemorrhage)   . Personal history of colonic polyps   . Coronary atherosclerosis of unspecified type of vessel, native or graft     1985 , arthroplasty x 1   . Myocardial infarction   . Depression     PTSD   . Hemorrhoids   . Esophageal stricture    Past Surgical History  Procedure Laterality Date  . Elbow bursa surgery    . Cholecystectomy    . Shoulder surgery      Rt. Shoulder  . Carpal tunnel release      left   . Coronary angioplasty      1985   . Colon surgery  2013    Allergies  Allergies  Allergen Reactions  . Doxazosin Mesylate     Reaction=unknown  . Penicillins Rash  . Sulfonamide Derivatives Rash    HPI  67 year old male with h/o MI in 1985, HTN, hyperlipidemia, NIDDM, former smoking who is a former patient of Dr. Riley Kill. He was lost for follow up since 2009 when he lost his insurance and was followed at [place]. He denies any CP, SOB, dizziness, palpiations, syncope. He had a nuclear stress test 3 years ago that was unchanged from prior (per patient) and no intervention was done. 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   This is 1 month follow up for BP control. The patient feels well. He brought his BP diary.  Home Medications  Prior to Admission medications   Medication Sig Start Date End Date Taking? Authorizing Provider  aspirin 81 MG tablet Take 81 mg by mouth every morning.    Yes Historical Provider, MD  cholecalciferol (VITAMIN D) 1000 UNITS tablet Take 2,000 Units by mouth daily.   Yes Historical Provider, MD  citalopram (CELEXA) 10 MG tablet Take 20 mg by mouth daily.    Yes Historical Provider, MD  colestipol (COLESTID) 5 G granules Take 5 g by mouth daily.   Yes Historical Provider, MD  fexofenadine (ALLEGRA) 180 MG tablet Take 180 mg by mouth daily as needed. For allergies   Yes Historical Provider, MD  fish oil-omega-3 fatty acids 1000 MG capsule Take 500 mg by mouth 2 (two) times daily.   Yes Historical Provider, MD  meloxicam (MOBIC) 15 MG tablet Take 1 tablet (15 mg total) by mouth daily. 04/25/13  Yes Donita Brooks, MD  montelukast (SINGULAIR) 10 MG tablet Take 1 tablet (10 mg total) by mouth at bedtime. 12/20/12  Yes Donita Brooks, MD  Multiple Vitamin (MULTIVITAMIN) tablet Take 1 tablet by mouth daily.  Yes Historical Provider, MD  olmesartan (BENICAR) 20 MG tablet Take 20 mg by mouth every morning.   Yes Historical Provider, MD  pantoprazole (PROTONIX) 40 MG tablet Take 40 mg by mouth every morning.    Yes Historical Provider, MD  PATADAY 0.2 % SOLN INSTILL ONE DROP INTO EACH EYE DAILY AS NEEDED FOR ITCH. 01/06/13  Yes Donita Brooks, MD  pioglitazone (ACTOS) 15 MG tablet Take 15 mg by mouth every morning.    Yes Historical Provider, MD  polyethylene glycol powder (GLYCOLAX/MIRALAX) powder Take 17 g by mouth daily. 03/12/13  Yes Juliet Rude. Pickering, MD  simvastatin (ZOCOR) 20 MG tablet Take 30 mg by mouth every evening.   Yes Historical Provider, MD  Testosterone (ANDROGEL) 20.25 MG/1.25GM (1.62%) GEL Place onto the skin daily. 2 pumps  daily   Yes Historical Provider, MD  triamcinolone (NASACORT) 55 MCG/ACT nasal inhaler Place 2 sprays into the nose daily.   Yes Historical Provider, MD    Family History  Family History  Problem Relation Age of Onset  . Lung cancer Father   . Coronary artery disease Mother   . Colon cancer Neg Hx     Social History  History   Social History  . Marital Status: Married    Spouse Name: N/A    Number of Children: 1  . 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 Smoking status: Former Smoker    Quit date: 08/30/1983  . Smokeless tobacco: Former Neurosurgeon  . Alcohol Use: Yes     Comment: rare  . Drug Use: No  . Sexual Activity: Not on file   Other Topics Concern  . Not on file   Social History Narrative  . No narrative on file     Review of Systems General:  No chills, fever, night sweats or weight changes.  Cardiovascular:  No chest pain, dyspnea on exertion, edema, orthopnea, palpitations, paroxysmal nocturnal dyspnea. Dermatological: No rash, lesions/masses Respiratory: No cough, dyspnea Urologic: No hematuria, dysuria Abdominal:   No nausea, vomiting, diarrhea, bright red blood per rectum, melena, or hematemesis Neurologic:  No visual changes, wkns, changes in mental status. All other systems reviewed and are otherwise negative except as noted above.  Physical Exam  Blood pressure 133/73, pulse 75, height 5\' 11"  (1.803 m), weight 247 lb (112.038 kg).  General: Pleasant, NAD Psych: Normal affect. Neuro: Alert and oriented X 3. Moves all extremities spontaneously. HEENT: Normal  Neck: Supple without bruits or JVD. Lungs:  Resp regular and unlabored, CTA. Heart: RRR no s3, s4, or murmurs. Abdomen: Soft, non-tender, non-distended, BS + x 4.  Extremities: No clubbing, cyanosis or edema. DP/PT/Radials 2+ and equal bilaterally.  Accessory Clinical Findings  ECG - SR, 70/min, LAD,PR , QRS 98ms, QT 398 ms, prior  inferior MI, no ST - T wave abnormalities  Assessment & Plan  67 yearold male who was followed in our clinic for years and is coming back for re-establishing cardiology care  1. CAD - currently asymptomatic, we will continue with Aspirin, BB, ARB, statin  We will consider stress test if any change in status  2. Hypertension - still elevated, multiple values over 150 mmHG, we will increase Benicar to 40 mg daily.  3. Hyperlipidemia - very elevated TAG., very good LDL 50- and HDL 57. We will decrease Lipitor to 20 mg QHS.  4. NIDDM - elevated HbA1c - the patient will follow with PCP  5. Screening for AAA - male > 65 years, former smoker  Follow up in 1 year.   Tobias Alexander, Rexene Edison, MD 06/05/2013, 9:52 AM

## 2013-06-09 ENCOUNTER — Other Ambulatory Visit: Payer: Self-pay | Admitting: Family Medicine

## 2013-06-24 ENCOUNTER — Telehealth: Payer: Self-pay | Admitting: Cardiology

## 2013-06-24 ENCOUNTER — Encounter: Payer: Self-pay | Admitting: Cardiology

## 2013-06-24 NOTE — Progress Notes (Signed)
Patient ID: Cameron Daniel, male   DOB: Jul 31, 1946, 67 y.o.   MRN: [id number]  The patient called with a complain of muscle pain after using Lipitor for 2 months. We will switch to Crestor 10 mg po QHS and recheck liver enzymes and a kidney function.   Tobias Alexander, Rexene Edison 06/24/2013

## 2013-06-24 NOTE — Telephone Encounter (Signed)
**Note De-Identified Nasreen Goedecke Obfuscation** Pt states that he has been taking Atorvastatin for about 2 months and that gradually he has developed muscle and joint pain in his legs, arms, fingers and neck. He thinks the pain is caused by Atorvastatin and wants to know what to do. Pt is advised not to take Atorvastatin dose tonight and that I will contact him tomorrow with Dr Lindaann Slough recommendation, he verbalized understanding. Please advise.

## 2013-06-24 NOTE — Telephone Encounter (Signed)
Larita Fife, We can try to switch to Crestor 10 mg po QHS. We need to check his liver enzymes. Could you call him ? Thank you, Aris Lot

## 2013-06-24 NOTE — Telephone Encounter (Signed)
New problem  Patient is taking lipitor, It is causing muscle and joint pain. Please call patient

## 2013-06-25 MED ORDER — ROSUVASTATIN CALCIUM 10 MG PO TABS: 10.0000 mg | ORAL_TABLET | Freq: Every day | ORAL | Status: DC

## 2013-06-25 NOTE — Telephone Encounter (Signed)
Pt advised, he verbalized understanding. He states that he wants to take Crestor for a couple of weeks to make sure he can tolerate it then he will call office to schedule labs. RX sent to [facility] to fill per pt request.

## 2013-07-05 ENCOUNTER — Telehealth: Payer: Self-pay | Admitting: Family Medicine

## 2013-07-05 NOTE — Telephone Encounter (Signed)
Patient was given a prescription for Mobic for his joint pain . If he takes it on a regular bases it makes his blood pressure go up. Do I need another appointment , or do you need to give me a referral for a Rheumatologist.

## 2013-07-08 NOTE — Telephone Encounter (Signed)
Would not recommend rheum as it is osteoarthritis.  We can adjust his blood pressure med if necessary.  All arthritis pills can potentially raise BP.

## 2013-07-09 NOTE — Telephone Encounter (Signed)
.  Patient's wife aware and he will take mobic daily and keep track of bp and call us back to let us know how high it is.

## 2013-07-10 NOTE — Telephone Encounter (Signed)
Agree 

## 2013-08-01 ENCOUNTER — Telehealth: Payer: Self-pay | Admitting: Cardiology

## 2013-08-01 DIAGNOSIS — E785 Hyperlipidemia, unspecified: Secondary | ICD-10-CM

## 2013-08-01 MED ORDER — ROSUVASTATIN CALCIUM 10 MG PO TABS: 10.0000 mg | ORAL_TABLET | Freq: Every day | ORAL | Status: DC

## 2013-08-01 NOTE — Telephone Encounter (Signed)
New problem:  Pt is requesting he come in for blood work to check his liver and cholesterol. Pt does not have any orders in Epic. Pt is requesting a call back to let him know if he needs blood work and if it should be fasting.

## 2013-08-01 NOTE — Telephone Encounter (Signed)
LMTCB

## 2013-08-01 NOTE — Telephone Encounter (Signed)
Follow up ° ° ° ° °Returned nurses call °

## 2013-08-01 NOTE — Telephone Encounter (Signed)
Left message with pt's wife for pt to return call.

## 2013-08-01 NOTE — Telephone Encounter (Signed)
Follow up    Pt returned nurses call

## 2013-08-01 NOTE — Telephone Encounter (Signed)
Pt states that he will be here next week to have labs drawn but not sure what day, he is aware not to eat or drink after midnight the night before labs are drawn. Crestor refill sent to [facility] in [place] to fill per pt request.

## 2013-08-26 ENCOUNTER — Other Ambulatory Visit (INDEPENDENT_AMBULATORY_CARE_PROVIDER_SITE_OTHER): Payer: Medicare Other

## 2013-08-26 DIAGNOSIS — E785 Hyperlipidemia, unspecified: Secondary | ICD-10-CM

## 2013-08-26 LAB — HEPATIC FUNCTION PANEL
ALT: 32 U/L (ref 0–53)
AST: 29 U/L (ref 0–37)
Albumin: 4.2 g/dL (ref 3.5–5.2)
Alkaline Phosphatase: 59 U/L (ref 39–117)
Bilirubin, Direct: 0.1 mg/dL (ref 0.0–0.3)
Total Bilirubin: 1 mg/dL (ref 0.3–1.2)
Total Protein: 6.5 g/dL (ref 6.0–8.3)

## 2013-08-26 LAB — LIPID PANEL
Cholesterol: 135 mg/dL (ref 0–200)
HDL: 56.8 mg/dL (ref >39.00)
Total CHOL/HDL Ratio: 2
Triglycerides: 217 mg/dL — ABNORMAL HIGH (ref 0.0–149.0)
VLDL: 43.4 mg/dL — ABNORMAL HIGH (ref 0.0–40.0)

## 2013-08-26 LAB — LDL CHOLESTEROL, DIRECT: Direct LDL: 49.9 mg/dL

## 2013-09-27 ENCOUNTER — Telehealth: Payer: Self-pay | Admitting: Family Medicine

## 2013-09-27 NOTE — Telephone Encounter (Signed)
The pt called and states that he is having head and chest congestion, cough no fever or achy feeling.  Per [facility] can take corcidin HBP and mucinex. We can also call in some tessalon or hycodan if pt needs for cough. We can work him in if he thinks he needs to be seen.  Patient is aware and will try those medications and if no better will call us back.

## 2013-09-30 ENCOUNTER — Ambulatory Visit (INDEPENDENT_AMBULATORY_CARE_PROVIDER_SITE_OTHER): Payer: Medicare HMO | Admitting: Family Medicine

## 2013-09-30 ENCOUNTER — Encounter: Payer: Self-pay | Admitting: Family Medicine

## 2013-09-30 VITALS — BP 120/70 | HR 78 | Temp 98.1°F | Resp 18 | Ht 71.0 in | Wt 246.0 lb

## 2013-09-30 DIAGNOSIS — J329 Chronic sinusitis, unspecified: Secondary | ICD-10-CM

## 2013-09-30 MED ORDER — LEVOFLOXACIN 750 MG PO TABS: 750.0000 mg | ORAL_TABLET | Freq: Every day | ORAL | Status: DC

## 2013-09-30 NOTE — Progress Notes (Signed)
Subjective:    Patient ID: Cameron Daniel, male    DOB: Dec 02, 1945, 68 y.o.   MRN: [id number]  HPI  Patient has had 1 week of sinus pressure, sinus drainage, pnd, sore throat, fevers and chills.   Past Medical History  Diagnosis Date  . Unspecified essential hypertension   . Other and unspecified hyperlipidemia   . Obesity, unspecified   . Esophageal reflux   . Nocturia   . Flushing   . Other malaise and fatigue   . Type II or unspecified type diabetes mellitus without mention of complication, not stated as uncontrolled   . Allergic rhinitis, cause unspecified   . Other acquired absence of organ   . Psychosexual dysfunction with inhibited sexual excitement   . Irritable bowel syndrome   . Diverticulosis of colon (without mention of hemorrhage)   . Personal history of colonic polyps   . Coronary atherosclerosis of unspecified type of vessel, native or graft     1985 , arthroplasty x 1   . Myocardial infarction   . Depression     PTSD   . Hemorrhoids   . Esophageal stricture    Current Outpatient Prescriptions on File Prior to Visit  Medication Sig Dispense Refill  . aspirin 81 MG tablet Take 81 mg by mouth every morning.       . cholecalciferol (VITAMIN D) 1000 UNITS tablet Take 2,000 Units by mouth daily.      . citalopram (CELEXA) 10 MG tablet Take 20 mg by mouth daily.       . colestipol (COLESTID) 5 G granules Take 5 g by mouth daily.      . fexofenadine (ALLEGRA) 180 MG tablet Take 180 mg by mouth daily as needed. For allergies      . fish oil-omega-3 fatty acids 1000 MG capsule Take 500 mg by mouth 2 (two) times daily.      . meloxicam (MOBIC) 15 MG tablet Take 1 tablet (15 mg total) by mouth daily.  30 tablet  1  . montelukast (SINGULAIR) 10 MG tablet Take 1 tablet (10 mg total) by mouth at bedtime.  30 tablet  3  . Multiple Vitamin (MULTIVITAMIN) tablet Take 1 tablet by mouth daily.       Marland Kitchen olmesartan (BENICAR) 40 MG tablet Take 1 tablet (40 mg total) by mouth  every morning.  90 tablet  3  . pantoprazole (PROTONIX) 40 MG tablet Take 40 mg by mouth every morning.       Marland Kitchen PATADAY 0.2 % SOLN INSTILL ONE DROP INTO EACH EYE DAILY AS NEEDED FOR ITCH   2.5 mL  4  . pioglitazone (ACTOS) 15 MG tablet Take 15 mg by mouth every morning.       . polyethylene glycol powder (GLYCOLAX/MIRALAX) powder Take 17 g by mouth daily.  255 g  0  . rosuvastatin (CRESTOR) 10 MG tablet Take 1 tablet (10 mg total) by mouth daily.  90 tablet  1  . Testosterone (ANDROGEL) 20.25 MG/1.25GM (1.62%) GEL Place onto the skin daily. 2 pumps daily      . triamcinolone (NASACORT) 55 MCG/ACT nasal inhaler Place 2 sprays into the nose daily.       No current facility-administered medications on file prior to visit.   Allergies  Allergen Reactions  . Doxazosin Mesylate     Reaction=unknown  . Penicillins Rash  . Sulfonamide Derivatives Rash   History   Social History  . Marital Status: Married    Spouse  Name: N/A    Number of Children: 1  . 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 Smoking status: Former Smoker    Quit date: 08/30/1983  . Smokeless tobacco: Former Systems developer  . Alcohol Use: Yes     Comment: rare  . Drug Use: No  . Sexual Activity: Not on file   Other Topics Concern  . Not on file   Social History Narrative  . No narrative on file     Review of Systems  All other systems reviewed and are negative.       Objective:   Physical Exam  Vitals reviewed. Constitutional: He appears well-developed and well-nourished. No distress.  HENT:  Right Ear: External ear normal.  Left Ear: External ear normal.  Nose: Mucosal edema and rhinorrhea present. Right sinus exhibits maxillary sinus tenderness and frontal sinus tenderness. Left sinus exhibits maxillary sinus tenderness and frontal sinus tenderness.  Mouth/Throat: Oropharynx is clear and moist. No oropharyngeal exudate.  Eyes: Conjunctivae are normal. Right  eye exhibits no discharge. Left eye exhibits no discharge. No scleral icterus.  Neck: Neck supple.  Cardiovascular: Normal rate, regular rhythm and normal heart sounds.   Pulmonary/Chest: Effort normal and breath sounds normal. No respiratory distress. He has no wheezes. He has no rales.  Lymphadenopathy:    He has no cervical adenopathy.  Skin: He is not diaphoretic.          Assessment & Plan:  1. Rhinosinusitis Also take coricidan hbp as needed for congestion and mucines dm as needed for cough. - levofloxacin (LEVAQUIN) 750 MG tablet; Take 1 tablet (750 mg total) by mouth daily.  Dispense: 7 tablet; Refill: 0

## 2013-10-01 ENCOUNTER — Encounter: Payer: Self-pay | Admitting: Family Medicine

## 2013-10-05 ENCOUNTER — Other Ambulatory Visit: Payer: Self-pay | Admitting: Family Medicine

## 2013-10-29 ENCOUNTER — Ambulatory Visit (INDEPENDENT_AMBULATORY_CARE_PROVIDER_SITE_OTHER): Payer: Medicare HMO | Admitting: Family Medicine

## 2013-10-29 ENCOUNTER — Telehealth: Payer: Self-pay | Admitting: Family Medicine

## 2013-10-29 ENCOUNTER — Encounter: Payer: Self-pay | Admitting: Family Medicine

## 2013-10-29 VITALS — BP 128/84 | HR 68 | Temp 97.0°F | Resp 18 | Wt 251.0 lb

## 2013-10-29 DIAGNOSIS — K529 Noninfective gastroenteritis and colitis, unspecified: Secondary | ICD-10-CM

## 2013-10-29 DIAGNOSIS — K5289 Other specified noninfective gastroenteritis and colitis: Secondary | ICD-10-CM

## 2013-10-29 MED ORDER — PROMETHAZINE HCL 25 MG PO TABS: 25.0000 mg | ORAL_TABLET | Freq: Three times a day (TID) | ORAL | Status: DC | PRN

## 2013-10-29 MED ORDER — PIOGLITAZONE HCL 15 MG PO TABS: 15.0000 mg | ORAL_TABLET | Freq: Every morning | ORAL | Status: DC

## 2013-10-29 NOTE — Telephone Encounter (Signed)
Pt with severe diarrhea ,vomiting.  Really feels need OV today.  Appt given.

## 2013-10-29 NOTE — Progress Notes (Signed)
Subjective:    Patient ID: Cameron Daniel, male    DOB: [birth year], 68 y.o.   MRN: [id number]  HPI Chest 3 days of nausea diarrhea and head congestion.  He also has rhinorrhea and a sore throat. He denies any coughing. He denies any vomiting. He denies any abdominal pain. He does report bloating. He has trace hematochezia hemorrhage irritation from the diarrhea. Past Medical History  Diagnosis Date  . Unspecified essential hypertension   . Other and unspecified hyperlipidemia   . Obesity, unspecified   . Esophageal reflux   . Nocturia   . Flushing   . Other malaise and fatigue   . Type II or unspecified type diabetes mellitus without mention of complication, not stated as uncontrolled   . Allergic rhinitis, cause unspecified   . Other acquired absence of organ   . Psychosexual dysfunction with inhibited sexual excitement   . Irritable bowel syndrome   . Diverticulosis of colon (without mention of hemorrhage)   . Personal history of colonic polyps   . Coronary atherosclerosis of unspecified type of vessel, native or graft     1985 , arthroplasty x 1   . Myocardial infarction   . Depression     PTSD   . Hemorrhoids   . Esophageal stricture    Current Outpatient Prescriptions on File Prior to Visit  Medication Sig Dispense Refill  . aspirin 81 MG tablet Take 81 mg by mouth every morning.       . cholecalciferol (VITAMIN D) 1000 UNITS tablet Take 2,000 Units by mouth daily.      . citalopram (CELEXA) 10 MG tablet Take 20 mg by mouth daily.       . colestipol (COLESTID) 5 G granules Take 5 g by mouth daily.      . fexofenadine (ALLEGRA) 180 MG tablet Take 180 mg by mouth daily as needed. For allergies      . fish oil-omega-3 fatty acids 1000 MG capsule Take 500 mg by mouth 2 (two) times daily.      . meloxicam (MOBIC) 15 MG tablet TAKE ONE TABLET BY MOUTH ONE TIME DAILY   30 tablet  11  . montelukast (SINGULAIR) 10 MG tablet Take 1 tablet (10 mg total) by mouth at bedtime.  30  tablet  3  . Multiple Vitamin (MULTIVITAMIN) tablet Take 1 tablet by mouth daily.       Marland Kitchen olmesartan (BENICAR) 40 MG tablet Take 1 tablet (40 mg total) by mouth every morning.  90 tablet  3  . pantoprazole (PROTONIX) 40 MG tablet Take 40 mg by mouth every morning.       Marland Kitchen PATADAY 0.2 % SOLN INSTILL ONE DROP INTO EACH EYE DAILY AS NEEDED FOR ITCH   2.5 mL  4  . polyethylene glycol powder (GLYCOLAX/MIRALAX) powder Take 17 g by mouth daily.  255 g  0  . rosuvastatin (CRESTOR) 10 MG tablet Take 1 tablet (10 mg total) by mouth daily.  90 tablet  1  . Testosterone (ANDROGEL) 20.25 MG/1.25GM (1.62%) GEL Place onto the skin daily. 2 pumps daily      . triamcinolone (NASACORT) 55 MCG/ACT nasal inhaler Place 2 sprays into the nose daily.       No current facility-administered medications on file prior to visit.   Allergies  Allergen Reactions  . Doxazosin Mesylate     Reaction=unknown  . Penicillins Rash  . Sulfonamide Derivatives Rash   History   Social History  . Marital  Status: Married    Spouse Name: N/A    Number of Children: 1  . 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 Smoking status: Former Smoker    Quit date: 08/30/1983  . Smokeless tobacco: Former Systems developer  . Alcohol Use: Yes     Comment: rare  . Drug Use: No  . Sexual Activity: Not on file   Other Topics Concern  . Not on file   Social History Narrative  . No narrative on file      Review of Systems  All other systems reviewed and are negative.       Objective:   Physical Exam  Vitals reviewed. Constitutional: He appears well-developed and well-nourished.  HENT:  Right Ear: External ear normal.  Left Ear: External ear normal.  Nose: Nose normal.  Mouth/Throat: Oropharynx is clear and moist. No oropharyngeal exudate.  Eyes: Conjunctivae are normal. No scleral icterus.  Neck: Neck supple.  Cardiovascular: Normal rate, regular rhythm and normal heart sounds.     No murmur heard. Pulmonary/Chest: Effort normal and breath sounds normal. No respiratory distress. He has no wheezes. He has no rales.  Abdominal: Soft. Bowel sounds are normal. He exhibits no distension. There is no tenderness. There is no rebound and no guarding.  Lymphadenopathy:    He has no cervical adenopathy.          Assessment & Plan:  1. gastroenteritis and colitis I believe the patient has viral gastroenteritis. I recommended tincture of time. I recommended he push fluids. I recommended Imodium over-the-counter as needed for diarrhea. I recommended Phenergan 25 mg every 8 hours as needed for nausea or vomiting. I anticipate spontaneous resolution infantile days. He is seen immediately if symptoms worsen. - promethazine (PHENERGAN) 25 MG tablet; Take 1 tablet (25 mg total) by mouth every 8 (eight) hours as needed for nausea or vomiting.  Dispense: 20 tablet; Refill: 0

## 2013-11-08 ENCOUNTER — Telehealth: Payer: Self-pay | Admitting: Family Medicine

## 2013-11-08 MED ORDER — LEVOFLOXACIN 500 MG PO TABS: 500.0000 mg | ORAL_TABLET | Freq: Every day | ORAL | Status: DC

## 2013-11-08 MED ORDER — AZITHROMYCIN 250 MG PO TABS: ORAL_TABLET | ORAL | Status: DC

## 2013-11-08 NOTE — Telephone Encounter (Signed)
Pt called stating that he is having yellow drainage and feels like a sinus infection can we call him in something?  Per Dr. Dennard Schaumann ok to do Levaquin  Med sent to pharm. And pt aware.

## 2013-11-30 ENCOUNTER — Other Ambulatory Visit: Payer: Self-pay | Admitting: Family Medicine

## 2013-12-02 ENCOUNTER — Telehealth: Payer: Self-pay | Admitting: *Deleted

## 2013-12-02 MED ORDER — PIOGLITAZONE HCL 15 MG PO TABS: ORAL_TABLET | ORAL | Status: DC

## 2013-12-02 NOTE — Telephone Encounter (Signed)
Pt is requesting a refill on Actos 15 mg please send to [facility] [place] [facility].  Call back number is [phone]

## 2013-12-02 NOTE — Telephone Encounter (Signed)
Prescription sent to pharmacy.

## 2013-12-02 NOTE — Addendum Note (Signed)
Addended by: Shary Decamp B on: 12/02/2013 05:01 PM   Modules accepted: Orders

## 2013-12-02 NOTE — Telephone Encounter (Signed)
Rx Refilled  

## 2013-12-03 ENCOUNTER — Telehealth: Payer: Self-pay | Admitting: Family Medicine

## 2013-12-03 MED ORDER — PIOGLITAZONE HCL 15 MG PO TABS: ORAL_TABLET | ORAL | Status: DC

## 2013-12-03 NOTE — Telephone Encounter (Signed)
90 day refill sent. 

## 2013-12-09 ENCOUNTER — Encounter: Payer: Self-pay | Admitting: Family Medicine

## 2013-12-09 ENCOUNTER — Ambulatory Visit (INDEPENDENT_AMBULATORY_CARE_PROVIDER_SITE_OTHER): Payer: Medicare HMO | Admitting: Family Medicine

## 2013-12-09 VITALS — BP 120/78 | HR 78 | Temp 98.5°F | Resp 18 | Ht 71.0 in | Wt 247.0 lb

## 2013-12-09 DIAGNOSIS — K5732 Diverticulitis of large intestine without perforation or abscess without bleeding: Secondary | ICD-10-CM

## 2013-12-09 MED ORDER — CIPROFLOXACIN HCL 500 MG PO TABS: 500.0000 mg | ORAL_TABLET | Freq: Two times a day (BID) | ORAL | Status: DC

## 2013-12-09 MED ORDER — METRONIDAZOLE 500 MG PO TABS: 500.0000 mg | ORAL_TABLET | Freq: Two times a day (BID) | ORAL | Status: DC

## 2013-12-09 NOTE — Progress Notes (Signed)
Subjective:    Patient ID: Cameron Daniel, male    DOB: 26-Apr-1946, 68 y.o.   MRN: [id number]  HPI Patient reports 4 days of left lower quadrant abdominal pain. He has a history of diverticulitis requiring partial colectomy due to perforated bowel. He states the pain feels similar to previous episodes of diverticulitis he has had. He denies any fever. He denies any diarrhea. He denies any hematochezia. He reports excessive gas pain in the left lower quadrant. He denies any constipation. He denies any nausea or vomiting. Past Medical History  Diagnosis Date  . Unspecified essential hypertension   . Other and unspecified hyperlipidemia   . Obesity, unspecified   . Esophageal reflux   . Nocturia   . Flushing   . Other malaise and fatigue   . Type II or unspecified type diabetes mellitus without mention of complication, not stated as uncontrolled   . Allergic rhinitis, cause unspecified   . Other acquired absence of organ   . Psychosexual dysfunction with inhibited sexual excitement   . Irritable bowel syndrome   . Diverticulosis of colon (without mention of hemorrhage)   . Personal history of colonic polyps   . Coronary atherosclerosis of unspecified type of vessel, native or graft     1985 , arthroplasty x 1   . Myocardial infarction   . Depression     PTSD   . Hemorrhoids   . Esophageal stricture    Current Outpatient Prescriptions on File Prior to Visit  Medication Sig Dispense Refill  . aspirin 81 MG tablet Take 81 mg by mouth every morning.       Marland Kitchen azithromycin (ZITHROMAX) 250 MG tablet .2 tabs po x 1 day; 1 tab po qd x days 2-5  6 tablet  0  . cholecalciferol (VITAMIN D) 1000 UNITS tablet Take 2,000 Units by mouth daily.      . citalopram (CELEXA) 10 MG tablet Take 20 mg by mouth daily.       . colestipol (COLESTID) 5 G granules Take 5 g by mouth daily.      . fexofenadine (ALLEGRA) 180 MG tablet Take 180 mg by mouth daily as needed. For allergies      . fish  oil-omega-3 fatty acids 1000 MG capsule Take 500 mg by mouth 2 (two) times daily.      . meloxicam (MOBIC) 15 MG tablet TAKE ONE TABLET BY MOUTH ONE TIME DAILY   30 tablet  11  . montelukast (SINGULAIR) 10 MG tablet Take 1 tablet (10 mg total) by mouth at bedtime.  30 tablet  3  . Multiple Vitamin (MULTIVITAMIN) tablet Take 1 tablet by mouth daily.       Marland Kitchen olmesartan (BENICAR) 40 MG tablet Take 1 tablet (40 mg total) by mouth every morning.  90 tablet  3  . pantoprazole (PROTONIX) 40 MG tablet Take 40 mg by mouth every morning.       Marland Kitchen PATADAY 0.2 % SOLN INSTILL ONE DROP INTO EACH EYE DAILY AS NEEDED FOR ITCH   2.5 mL  4  . pioglitazone (ACTOS) 15 MG tablet TAKE ONE TABLET BY MOUTH EVERY MORNING  90 tablet  0  . polyethylene glycol powder (GLYCOLAX/MIRALAX) powder Take 17 g by mouth daily.  255 g  0  . promethazine (PHENERGAN) 25 MG tablet Take 1 tablet (25 mg total) by mouth every 8 (eight) hours as needed for nausea or vomiting.  20 tablet  0  . rosuvastatin (CRESTOR) 10 MG  tablet Take 1 tablet (10 mg total) by mouth daily.  90 tablet  1  . Testosterone (ANDROGEL) 20.25 MG/1.25GM (1.62%) GEL Place onto the skin daily. 2 pumps daily      . triamcinolone (NASACORT) 55 MCG/ACT nasal inhaler Place 2 sprays into the nose daily.       No current facility-administered medications on file prior to visit.   Allergies  Allergen Reactions  . Doxazosin Mesylate     Reaction=unknown  . Penicillins Rash  . Sulfonamide Derivatives Rash   History   Social History  . Marital Status: Married    Spouse Name: N/A    Number of Children: 1  . 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 Smoking status: Former Smoker    Quit date: 08/30/1983  . Smokeless tobacco: Former Systems developer  . Alcohol Use: Yes     Comment: rare  . Drug Use: No  . Sexual Activity: Not on file   Other Topics Concern  . Not on file   Social History Narrative  . No narrative on file        Review of Systems  All other systems reviewed and are negative.      Objective:   Physical Exam  Vitals reviewed. Constitutional: He appears well-developed and well-nourished.  Cardiovascular: Normal rate, regular rhythm, normal heart sounds and intact distal pulses.  Exam reveals no gallop and no friction rub.   No murmur heard. Pulmonary/Chest: Effort normal and breath sounds normal. No respiratory distress. He has no wheezes. He has no rales.  Abdominal: Soft. He exhibits no distension. There is tenderness. There is no rebound and no guarding.          Assessment & Plan:  1. Diverticulitis of colon without hemorrhage Recheck in 48 hours if no better or sooner if worse. If worsening I would obtain a CT scan of the abdomen and pelvis. - ciprofloxacin (CIPRO) 500 MG tablet; Take 1 tablet (500 mg total) by mouth 2 (two) times daily.  Dispense: 20 tablet; Refill: 0 - metroNIDAZOLE (FLAGYL) 500 MG tablet; Take 1 tablet (500 mg total) by mouth 2 (two) times daily.  Dispense: 20 tablet; Refill: 0

## 2014-01-16 ENCOUNTER — Ambulatory Visit (INDEPENDENT_AMBULATORY_CARE_PROVIDER_SITE_OTHER): Payer: Medicare HMO | Admitting: Family Medicine

## 2014-01-16 ENCOUNTER — Encounter: Payer: Self-pay | Admitting: Family Medicine

## 2014-01-16 VITALS — BP 110/70 | HR 76 | Temp 98.5°F | Resp 20 | Ht 71.0 in | Wt 247.0 lb

## 2014-01-16 DIAGNOSIS — Z9109 Other allergy status, other than to drugs and biological substances: Secondary | ICD-10-CM

## 2014-01-16 MED ORDER — PREDNISONE 20 MG PO TABS: ORAL_TABLET | ORAL | Status: DC

## 2014-01-16 MED ORDER — METHYLPREDNISOLONE ACETATE 40 MG/ML IJ SUSP
80.0000 mg | Freq: Once | INTRAMUSCULAR | Status: AC
  Administered 2014-01-16: 80 mg via INTRAMUSCULAR

## 2014-01-16 NOTE — Progress Notes (Signed)
Subjective:    Patient ID: RAEL YO, male    DOB: October 25, 1945, 68 y.o.   MRN: [id number]  HPI  Patient has a history of severe rhinitis due to allergies, allergic conjunctivitis, and wheezing due to allergies. Last week his allergies have gotten significantly worse.  He is currently taking Pataday, Singulair, and Allegra without benefit. He is requesting a depomedrol injection to decrease the itching and burning in his eyes and to help with the wheezing and severe sinus congestion, pressure, and pain. Past Medical History  Diagnosis Date  . Unspecified essential hypertension   . Other and unspecified hyperlipidemia   . Obesity, unspecified   . Esophageal reflux   . Nocturia   . Flushing   . Other malaise and fatigue   . Type II or unspecified type diabetes mellitus without mention of complication, not stated as uncontrolled   . Allergic rhinitis, cause unspecified   . Other acquired absence of organ   . Psychosexual dysfunction with inhibited sexual excitement   . Irritable bowel syndrome   . Diverticulosis of colon (without mention of hemorrhage)   . Personal history of colonic polyps   . Coronary atherosclerosis of unspecified type of vessel, native or graft     1985 , arthroplasty x 1   . Myocardial infarction   . Depression     PTSD   . Hemorrhoids   . Esophageal stricture    Current Outpatient Prescriptions on File Prior to Visit  Medication Sig Dispense Refill  . aspirin 81 MG tablet Take 81 mg by mouth every morning.       . cholecalciferol (VITAMIN D) 1000 UNITS tablet Take 2,000 Units by mouth daily.      . citalopram (CELEXA) 10 MG tablet Take 20 mg by mouth daily.       . colestipol (COLESTID) 5 G granules Take 5 g by mouth daily.      . fexofenadine (ALLEGRA) 180 MG tablet Take 180 mg by mouth daily as needed. For allergies      . fish oil-omega-3 fatty acids 1000 MG capsule Take 500 mg by mouth 2 (two) times daily.      . meloxicam (MOBIC) 15 MG tablet  TAKE ONE TABLET BY MOUTH ONE TIME DAILY   30 tablet  11  . montelukast (SINGULAIR) 10 MG tablet Take 1 tablet (10 mg total) by mouth at bedtime.  30 tablet  3  . Multiple Vitamin (MULTIVITAMIN) tablet Take 1 tablet by mouth daily.       Marland Kitchen olmesartan (BENICAR) 40 MG tablet Take 1 tablet (40 mg total) by mouth every morning.  90 tablet  3  . pantoprazole (PROTONIX) 40 MG tablet Take 40 mg by mouth every morning.       Marland Kitchen PATADAY 0.2 % SOLN INSTILL ONE DROP INTO EACH EYE DAILY AS NEEDED FOR ITCH   2.5 mL  4  . pioglitazone (ACTOS) 15 MG tablet TAKE ONE TABLET BY MOUTH EVERY MORNING  90 tablet  0  . polyethylene glycol powder (GLYCOLAX/MIRALAX) powder Take 17 g by mouth daily.  255 g  0  . promethazine (PHENERGAN) 25 MG tablet Take 1 tablet (25 mg total) by mouth every 8 (eight) hours as needed for nausea or vomiting.  20 tablet  0  . rosuvastatin (CRESTOR) 10 MG tablet Take 1 tablet (10 mg total) by mouth daily.  90 tablet  1  . Testosterone (ANDROGEL) 20.25 MG/1.25GM (1.62%) GEL Place onto the skin daily. 2  pumps daily      . triamcinolone (NASACORT) 55 MCG/ACT nasal inhaler Place 2 sprays into the nose daily.       No current facility-administered medications on file prior to visit.   Allergies  Allergen Reactions  . Doxazosin Mesylate     Reaction=unknown  . Penicillins Rash  . Sulfonamide Derivatives Rash   History   Social History  . Marital Status: Married    Spouse Name: N/A    Number of Children: 1  . 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 Smoking status: Former Smoker    Quit date: 08/30/1983  . Smokeless tobacco: Former Systems developer  . Alcohol Use: Yes     Comment: rare  . Drug Use: No  . Sexual Activity: Not on file   Other Topics Concern  . Not on file   Social History Narrative  . No narrative on file     Review of Systems  All other systems reviewed and are negative.      Objective:   Physical Exam  Vitals  reviewed. HENT:  Right Ear: Tympanic membrane and ear canal normal.  Left Ear: Tympanic membrane and ear canal normal.  Nose: Mucosal edema and rhinorrhea present. Right sinus exhibits maxillary sinus tenderness and frontal sinus tenderness. Left sinus exhibits maxillary sinus tenderness and frontal sinus tenderness.  Mouth/Throat: Oropharynx is clear and moist and mucous membranes are normal.  Neck: Neck supple.  Cardiovascular: Normal rate.   Pulmonary/Chest: Effort normal and breath sounds normal.  Lymphadenopathy:    He has no cervical adenopathy.          Assessment & Plan:  1. Environmental allergies Depo-Medrol 80 mg IM x1. Also gave patient prescription for a prednisone taper in case his allergies return later this season. Hopefully we'll not need the prednisone. 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 - methylPREDNISolone acetate (DEPO-MEDROL) injection 80 mg; Inject 2 mLs (80 mg total) into the muscle once. - predniSONE (DELTASONE) 20 MG tablet; 3 tabs poqday 1-2, 2 tabs poqday 3-4, 1 tab poqday 5-6  Dispense: 12 tablet; Refill: 0

## 2014-02-03 ENCOUNTER — Telehealth: Payer: Self-pay | Admitting: Family Medicine

## 2014-02-03 NOTE — Telephone Encounter (Signed)
Patient aware of below and will try it over the weekend first to see if it makes him sleepy during the day.

## 2014-02-03 NOTE — Telephone Encounter (Signed)
Message copied by Alyson Locket on Mon Feb 03, 2014  4:14 PM ------      Message from: Jenna Luo      Created: Mon Feb 03, 2014  7:18 AM      Regarding: RE: Med      Contact: [phone]       Works pretty well for nerve pain.  Biggest side effects are dizziness and sleepiness but should be minimal if taken at night.      ----- Message -----         From: Alyson Locket         Sent: 01/31/2014   3:09 PM           To: [name], MD      Subject: FW: Med                                                              ----- Message -----         From: Lenore Manner         Sent: 01/31/2014   2:21 PM           To: Alyson Locket      Subject: Med                                                      Pt was prescribed a medication for nerve pain and the name of it gabapentin 300 mg take by mouth at bedtime            What does dr pickard think of this??             Can leave message if no one answers            [phone] is another number to reach him        ------

## 2014-02-05 ENCOUNTER — Other Ambulatory Visit: Payer: Self-pay | Admitting: Cardiology

## 2014-02-05 ENCOUNTER — Other Ambulatory Visit: Payer: Self-pay | Admitting: Family Medicine

## 2014-02-05 MED ORDER — ROSUVASTATIN CALCIUM 10 MG PO TABS: 10.0000 mg | ORAL_TABLET | Freq: Every day | ORAL | Status: DC

## 2014-02-05 NOTE — Telephone Encounter (Signed)
Rx Refilled  

## 2014-02-07 ENCOUNTER — Other Ambulatory Visit: Payer: Self-pay | Admitting: Cardiology

## 2014-03-06 ENCOUNTER — Encounter: Payer: Self-pay | Admitting: Family Medicine

## 2014-03-06 ENCOUNTER — Ambulatory Visit (INDEPENDENT_AMBULATORY_CARE_PROVIDER_SITE_OTHER): Payer: Medicare HMO | Admitting: Family Medicine

## 2014-03-06 VITALS — BP 126/78 | HR 78 | Temp 98.4°F | Resp 68 | Ht 71.0 in | Wt 244.0 lb

## 2014-03-06 DIAGNOSIS — N509 Disorder of male genital organs, unspecified: Secondary | ICD-10-CM

## 2014-03-06 DIAGNOSIS — R5381 Other malaise: Secondary | ICD-10-CM

## 2014-03-06 DIAGNOSIS — R5383 Other fatigue: Secondary | ICD-10-CM

## 2014-03-06 DIAGNOSIS — N50812 Left testicular pain: Secondary | ICD-10-CM

## 2014-03-06 LAB — COMPLETE METABOLIC PANEL WITH GFR
ALT: 29 U/L (ref 0–53)
AST: 28 U/L (ref 0–37)
Albumin: 4.7 g/dL (ref 3.5–5.2)
Alkaline Phosphatase: 73 U/L (ref 39–117)
BUN: 14 mg/dL (ref 6–23)
CALCIUM: 10.4 mg/dL (ref 8.4–10.5)
CHLORIDE: 98 meq/L (ref 96–112)
CO2: 27 meq/L (ref 19–32)
Creat: 0.98 mg/dL (ref 0.50–1.35)
GFR, EST NON AFRICAN AMERICAN: 79 mL/min
GFR, Est African American: >89 mL/min
GLUCOSE: 132 mg/dL — AB (ref 70–99)
Potassium: 4.5 mEq/L (ref 3.5–5.3)
Sodium: 136 mEq/L (ref 135–145)
Total Bilirubin: 0.5 mg/dL (ref 0.2–1.2)
Total Protein: 6.8 g/dL (ref 6.0–8.3)

## 2014-03-06 LAB — CBC WITH DIFFERENTIAL/PLATELET
Basophils Absolute: 0 10*3/uL (ref 0.0–0.1)
Basophils Relative: 0 % (ref 0–1)
EOS PCT: 1 % (ref 0–5)
Eosinophils Absolute: 0.1 10*3/uL (ref 0.0–0.7)
HEMATOCRIT: 45.5 % (ref 39.0–52.0)
Hemoglobin: 15.3 g/dL (ref 13.0–17.0)
LYMPHS PCT: 30 % (ref 12–46)
Lymphs Abs: 2.5 10*3/uL (ref 0.7–4.0)
MCH: 29.3 pg (ref 26.0–34.0)
MCHC: 33.6 g/dL (ref 30.0–36.0)
MCV: 87.2 fL (ref 78.0–100.0)
MONO ABS: 0.6 10*3/uL (ref 0.1–1.0)
Monocytes Relative: 7 % (ref 3–12)
Neutro Abs: 5.2 10*3/uL (ref 1.7–7.7)
Neutrophils Relative %: 62 % (ref 43–77)
Platelets: 179 10*3/uL (ref 150–400)
RBC: 5.22 MIL/uL (ref 4.22–5.81)
RDW: 14.9 % (ref 11.5–15.5)
WBC: 8.4 10*3/uL (ref 4.0–10.5)

## 2014-03-06 LAB — TSH: TSH: 1.37 u[IU]/mL (ref 0.350–4.500)

## 2014-03-06 LAB — TESTOSTERONE: Testosterone: 248 ng/dL — ABNORMAL LOW (ref 300–890)

## 2014-03-06 LAB — VITAMIN B12: VITAMIN B 12: 493 pg/mL (ref 211–911)

## 2014-03-06 MED ORDER — DOXYCYCLINE HYCLATE 100 MG PO TABS: 100.0000 mg | ORAL_TABLET | Freq: Two times a day (BID) | ORAL | Status: DC

## 2014-03-06 NOTE — Progress Notes (Signed)
Subjective:    Patient ID: Cameron Daniel, male    DOB: 1946/01/17, 68 y.o.   MRN: [id number]  HPI Patient presents today with one-week of worsening left testicular pain. On examination the patient is exquisitely tender to palpation around the left epididymis.  He denies any urethral discharge. He denies any dysuria. He denies any fevers or chills.  He also complains of severe fatigue and hypersomnolence. He states that after lunch, he has no energy and he frequently falls asleep. He also snores loudly. He denies any apnea. Past Medical History  Diagnosis Date  . Unspecified essential hypertension   . Other and unspecified hyperlipidemia   . Obesity, unspecified   . Esophageal reflux   . Nocturia   . Flushing   . Other malaise and fatigue   . Type II or unspecified type diabetes mellitus without mention of complication, not stated as uncontrolled   . Allergic rhinitis, cause unspecified   . Other acquired absence of organ   . Psychosexual dysfunction with inhibited sexual excitement   . Irritable bowel syndrome   . Diverticulosis of colon (without mention of hemorrhage)   . Personal history of colonic polyps   . Coronary atherosclerosis of unspecified type of vessel, native or graft     1985 , arthroplasty x 1   . Myocardial infarction   . Depression     PTSD   . Hemorrhoids   . Esophageal stricture    Current Outpatient Prescriptions on File Prior to Visit  Medication Sig Dispense Refill  . aspirin 81 MG tablet Take 81 mg by mouth every morning.       . cholecalciferol (VITAMIN D) 1000 UNITS tablet Take 2,000 Units by mouth daily.      . citalopram (CELEXA) 10 MG tablet Take 20 mg by mouth daily.       . colestipol (COLESTID) 5 G granules Take 5 g by mouth daily.      . fexofenadine (ALLEGRA) 180 MG tablet Take 180 mg by mouth daily as needed. For allergies      . fish oil-omega-3 fatty acids 1000 MG capsule Take 500 mg by mouth 2 (two) times daily.      . meloxicam  (MOBIC) 15 MG tablet TAKE ONE TABLET BY MOUTH ONE TIME DAILY   30 tablet  11  . montelukast (SINGULAIR) 10 MG tablet Take 1 tablet (10 mg total) by mouth at bedtime.  30 tablet  3  . Multiple Vitamin (MULTIVITAMIN) tablet Take 1 tablet by mouth daily.       Marland Kitchen olmesartan (BENICAR) 40 MG tablet Take 1 tablet (40 mg total) by mouth every morning.  90 tablet  3  . pantoprazole (PROTONIX) 40 MG tablet Take 40 mg by mouth every morning.       Marland Kitchen PATADAY 0.2 % SOLN INSTILL ONE DROP INTO EACH EYE DAILY AS NEEDED FOR ITCH   2.5 mL  4  . pioglitazone (ACTOS) 15 MG tablet TAKE ONE TABLET BY MOUTH EVERY MORNING  90 tablet  0  . polyethylene glycol powder (GLYCOLAX/MIRALAX) powder Take 17 g by mouth daily.  255 g  0  . promethazine (PHENERGAN) 25 MG tablet Take 1 tablet (25 mg total) by mouth every 8 (eight) hours as needed for nausea or vomiting.  20 tablet  0  . rosuvastatin (CRESTOR) 10 MG tablet Take 1 tablet (10 mg total) by mouth daily.  90 tablet  1  . Testosterone (ANDROGEL) 20.25 MG/1.25GM (1.62%) GEL Place  onto the skin daily. 2 pumps daily      . triamcinolone (NASACORT) 55 MCG/ACT nasal inhaler Place 2 sprays into the nose daily.       No current facility-administered medications on file prior to visit.   Allergies  Allergen Reactions  . Doxazosin Mesylate     Reaction=unknown  . Penicillins Rash  . Sulfonamide Derivatives Rash   History   Social History  . Marital Status: Married    Spouse Name: N/A    Number of Children: 1  . 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 Smoking status: Former Smoker    Quit date: 08/30/1983  . Smokeless tobacco: Former Systems developer  . Alcohol Use: Yes     Comment: rare  . Drug Use: No  . Sexual Activity: Not on file   Other Topics Concern  . Not on file   Social History Narrative  . No narrative on file      Review of Systems  All other systems reviewed and are negative.      Objective:    Physical Exam  Vitals reviewed. Cardiovascular: Normal rate and regular rhythm.   Pulmonary/Chest: Effort normal and breath sounds normal.  Abdominal: Soft. Bowel sounds are normal. Hernia confirmed negative in the left inguinal area.  Genitourinary: Penis normal. Left testis shows tenderness. Left testis shows no mass and no swelling.  Lymphadenopathy:       Left: No inguinal adenopathy present.          Assessment & Plan:  1. Testicular pain, left I suspect epididymitis. Begin doxycycline 100 mg by mouth twice a day for 10 days. Pain is not improving on proceed with an ultrasound of the left testicle. - doxycycline (VIBRA-TABS) 100 MG tablet; Take 1 tablet (100 mg total) by mouth 2 (two) times daily.  Dispense: 20 tablet; Refill: 0  2. Other malaise and fatigue Check lab work. Lab is normal consider a sleep study to evaluate for obstructive sleep apnea. - CBC with Differential - COMPLETE METABOLIC PANEL WITH GFR - TSH - Vitamin B12 - Testosterone

## 2014-03-21 ENCOUNTER — Ambulatory Visit (INDEPENDENT_AMBULATORY_CARE_PROVIDER_SITE_OTHER): Payer: Medicare HMO | Admitting: Family Medicine

## 2014-03-21 ENCOUNTER — Encounter: Payer: Self-pay | Admitting: Family Medicine

## 2014-03-21 VITALS — BP 130/78 | HR 64 | Temp 98.3°F | Resp 14 | Ht 70.0 in | Wt 248.0 lb

## 2014-03-21 DIAGNOSIS — M1711 Unilateral primary osteoarthritis, right knee: Secondary | ICD-10-CM

## 2014-03-21 DIAGNOSIS — M1710 Unilateral primary osteoarthritis, unspecified knee: Secondary | ICD-10-CM

## 2014-03-21 NOTE — Progress Notes (Signed)
Patient ID: Cameron Daniel, male   DOB: 10/16/1945, 68 y.o.   MRN: [id number]   Subjective:    Patient ID: Cameron Daniel, male    DOB: 10/06/1945, 68 y.o.   MRN: [id number]  Patient presents for R Knee Pain  patient here with right knee pain for the past 3 days. He is known osteoarthritis in his hands as well as his neck and back. He's had some swelling in his right knee. He does not have a history of gout. He does remember being out working yard this week more than normal it is the leg that he pushes down to adjust the mower. He's been hearing a cracking sound also when he moves his knee. He has not taken any over-the-counter medications. No recent falls.    Review Of Systems:  GEN- denies fatigue, fever, weight loss,weakness, recent illness HEENT- denies eye drainage, change in vision, nasal discharge, CVS- denies chest pain, palpitations RESP- denies SOB, cough, wheeze MSK- + joint pain, muscle aches, injury Neuro- denies headache, dizziness, syncope, seizure activity       Objective:    BP 130/78  Pulse 64  Temp(Src) 98.3 F (36.8 C) (Oral)  Resp 14  Ht 5\' 10"  (1.778 m)  Wt 248 lb (112.492 kg)  BMI 35.58 kg/m2 GEN- NAD, alert and oriented x3 MSK- Right knee- mild effusion, no bony abnormality, crepitus, fair ROM, ligaments in tact, no warmth Left knee- fair ROM, no effusion, mild crepitus EXT- No edema Pulses- Radial, DP- 2+  Procedure-Knee injection Procedure explained to patient questions answered benefits and risks discussed Verbal consent obtained. Antiseptic-Betadine Anesthesia-lidocaine 1%, Marcaine 0.05%, Kenolog 40mg   (2:2:1) Minimal blood loss Patient tolerated procedure well Bandage applied      Assessment & Plan:      Problem List Items Addressed This Visit   None    Visit Diagnoses   Primary osteoarthritis of right knee    -  Primary    known OA of joints, overuse caused flaire, steroid injection given, take mobic on regular basis, ICE  knee       Note: This dictation was prepared with Dragon dictation along with smaller phrase technology. Any transcriptional errors that result from this process are unintentional.

## 2014-03-21 NOTE — Patient Instructions (Signed)
Mobic once a day starting tomorrow ICE the knee Call if not improved

## 2014-03-26 ENCOUNTER — Other Ambulatory Visit: Payer: Medicare HMO

## 2014-03-26 DIAGNOSIS — E785 Hyperlipidemia, unspecified: Secondary | ICD-10-CM

## 2014-03-26 DIAGNOSIS — Z79899 Other long term (current) drug therapy: Secondary | ICD-10-CM

## 2014-03-26 LAB — LIPID PANEL
Cholesterol: 140 mg/dL (ref 0–200)
HDL: 65 mg/dL (ref >39)
LDL Cholesterol: 14 mg/dL (ref 0–99)
Total CHOL/HDL Ratio: 2.2 Ratio
Triglycerides: 304 mg/dL — ABNORMAL HIGH (ref <150)
VLDL: 61 mg/dL — ABNORMAL HIGH (ref 0–40)

## 2014-05-23 ENCOUNTER — Other Ambulatory Visit: Payer: Self-pay | Admitting: Family Medicine

## 2014-05-23 NOTE — Telephone Encounter (Signed)
Refill appropriate and filled per protocol. 

## 2014-06-05 ENCOUNTER — Ambulatory Visit (INDEPENDENT_AMBULATORY_CARE_PROVIDER_SITE_OTHER): Payer: Private Health Insurance - Indemnity | Admitting: Cardiology

## 2014-06-05 ENCOUNTER — Encounter: Payer: Self-pay | Admitting: Cardiology

## 2014-06-05 VITALS — BP 122/72 | HR 67 | Ht 70.0 in | Wt 239.0 lb

## 2014-06-05 DIAGNOSIS — I10 Essential (primary) hypertension: Secondary | ICD-10-CM

## 2014-06-05 MED ORDER — OLMESARTAN MEDOXOMIL 20 MG PO TABS: 20.0000 mg | ORAL_TABLET | Freq: Every day | ORAL | Status: DC

## 2014-06-05 NOTE — Patient Instructions (Signed)
Your physician recommends that you continue on your current medications as directed. Please refer to the Current Medication list given to you today.   Your physician wants you to follow-up in: ONE YEAR WITH DR NELSON You will receive a reminder letter in the mail two months in advance. If you don't receive a letter, please call our office to schedule the follow-up appointment.  

## 2014-06-05 NOTE — Progress Notes (Signed)
Patient ID: Cameron Daniel, male   DOB: 04-03-1946, 68 y.o.   MRN: [id number]    [name] Date of Encounter: 06/05/2014  Primary Care Provider:  Odette Fraction, MD Primary Cardiologist:  [name], MD   Patient Profile Re-establish cardiology care  Problem List   Past Medical History  Diagnosis Date  . Unspecified essential hypertension   . Other and unspecified hyperlipidemia   . Obesity, unspecified   . Esophageal reflux   . Nocturia   . Flushing   . Other malaise and fatigue   . Type II or unspecified type diabetes mellitus without mention of complication, not stated as uncontrolled   . Allergic rhinitis, cause unspecified   . Other acquired absence of organ   . Psychosexual dysfunction with inhibited sexual excitement   . Irritable bowel syndrome   . Diverticulosis of colon (without mention of hemorrhage)   . Personal history of colonic polyps   . Coronary atherosclerosis of unspecified type of vessel, native or graft     1985 , arthroplasty x 1   . Myocardial infarction   . Depression     PTSD   . Hemorrhoids   . Esophageal stricture    Past Surgical History  Procedure Laterality Date  . Elbow bursa surgery    . Cholecystectomy    . Shoulder surgery      Rt. Shoulder  . Carpal tunnel release      left   . Coronary angioplasty      1985   . Colon surgery  2013    Allergies  Allergies  Allergen Reactions  . Doxazosin Mesylate     Reaction=unknown  . Penicillins Rash  . Sulfonamide Derivatives Rash    HPI  68 year old male with h/o MI in 1985, HTN, hyperlipidemia, NIDDM, former smoking who is a former patient of [name]. He was lost for follow up since 2009 when he lost his insurance and was followed at [place]. He denies any CP, SOB, dizziness, palpiations, syncope. He had a nuclear stress test 3 years ago that was unchanged from prior (per patient) and no intervention was done. He is active, working as a Development worker, international aid,  without any exertional chest pain.   This is 1 year follow up, he didn't tolerate Lipitor but has no side effects with Crestor. He does landscaping and works daily with no chest pain and stable dyspnea on moderate exertion.  He is complaint with his meds, but taking benicar 20 as 40 daily made him hypotensive. No palpitations, orthopnea, PND, LE edema.    Home Medications  Prior to Admission medications   Medication Sig Start Date End Date Taking? Authorizing Provider  aspirin 81 MG tablet Take 81 mg by mouth every morning.    Yes Historical Provider, MD  cholecalciferol (VITAMIN D) 1000 UNITS tablet Take 2,000 Units by mouth daily.   Yes Historical Provider, MD  citalopram (CELEXA) 10 MG tablet Take 20 mg by mouth daily.    Yes Historical Provider, MD  colestipol (COLESTID) 5 G granules Take 5 g by mouth daily.   Yes Historical Provider, MD  fexofenadine (ALLEGRA) 180 MG tablet Take 180 mg by mouth daily as needed. For allergies   Yes Historical Provider, MD  fish oil-omega-3 fatty acids 1000 MG capsule Take 500 mg by mouth 2 (two) times daily.   Yes Historical Provider, MD  meloxicam (MOBIC) 15 MG tablet Take 1 tablet (15 mg total) by mouth daily. 04/25/13  Yes [name], MD  montelukast (SINGULAIR) 10 MG tablet Take 1 tablet (10 mg total) by mouth at bedtime. 12/20/12  Yes [name], MD  Multiple Vitamin (MULTIVITAMIN) tablet Take 1 tablet by mouth daily.    Yes Historical Provider, MD  olmesartan (BENICAR) 20 MG tablet Take 20 mg by mouth every morning.   Yes Historical Provider, MD  pantoprazole (PROTONIX) 40 MG tablet Take 40 mg by mouth every morning.    Yes Historical Provider, MD  PATADAY 0.2 % SOLN INSTILL ONE DROP INTO EACH EYE DAILY AS NEEDED FOR ITCH. 01/06/13  Yes [name], MD  pioglitazone (ACTOS) 15 MG tablet Take 15 mg by mouth every morning.    Yes Historical Provider, MD  polyethylene glycol powder (GLYCOLAX/MIRALAX) powder Take 17 g by mouth daily.  03/12/13  Yes Jasper Riling. Pickering, MD  simvastatin (ZOCOR) 20 MG tablet Take 30 mg by mouth every evening.   Yes Historical Provider, MD  Testosterone (ANDROGEL) 20.25 MG/1.25GM (1.62%) GEL Place onto the skin daily. 2 pumps daily   Yes Historical Provider, MD  triamcinolone (NASACORT) 55 MCG/ACT nasal inhaler Place 2 sprays into the nose daily.   Yes Historical Provider, MD    Family History  Family History  Problem Relation Age of Onset  . Lung cancer Father   . Coronary artery disease Mother   . Colon cancer Neg Hx     Social History  History   Social History  . Marital Status: Married    Spouse Name: N/A    Number of Children: 1  . 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 Smoking status: Former Smoker    Quit date: 08/30/1983  . Smokeless tobacco: Former Systems developer  . Alcohol Use: Yes     Comment: rare  . Drug Use: No  . Sexual Activity: Not on file   Other Topics Concern  . Not on file   Social History Narrative  . No narrative on file     Review of Systems General:  No chills, fever, night sweats or weight changes.  Cardiovascular:  No chest pain, dyspnea on exertion, edema, orthopnea, palpitations, paroxysmal nocturnal dyspnea. Dermatological: No rash, lesions/masses Respiratory: No cough, dyspnea Urologic: No hematuria, dysuria Abdominal:   No nausea, vomiting, diarrhea, bright red blood per rectum, melena, or hematemesis Neurologic:  No visual changes, wkns, changes in mental status. All other systems reviewed and are otherwise negative except as noted above.  Physical Exam  Blood pressure 122/72, pulse 67, height 5\' 10"  (1.778 m), weight 239 lb (108.41 kg).  General: Pleasant, NAD Psych: Normal affect. Neuro: Alert and oriented X 3. Moves all extremities spontaneously. HEENT: Normal  Neck: Supple without bruits or JVD. Lungs:  Resp regular and unlabored, CTA. Heart: RRR no s3, s4, or murmurs. Abdomen:  Soft, non-tender, non-distended, BS + x 4.  Extremities: No clubbing, cyanosis or edema. DP/PT/Radials 2+ and equal bilaterally.  Accessory Clinical Findings  ECG - SR, 70/min, LAD,PR 132ms, QRS 38ms, QT 398 ms, prior inferior MI, no ST - T wave abnormalities, unchanged from 2014    Assessment & Plan  68 yearold male who was followed in our clinic for years and is coming back for re-establishing cardiology care  1. CAD - currently asymptomatic, we will continue with Aspirin, BB, ARB, statin  We will consider stress test if any change in status  2. Hypertension - controlled  3.  Hyperlipidemia - very elevated TAG., very good LDL 14- and HDL 65. We started Crestor, added fish oil. He eats lot of sweets and had DM< advised on diet.  4. NIDDM - elevated HbA1c - the patient will follow with PCP  Follow up in 1 year.   Dorothy Spark, MD 06/05/2014, 9:51 AM

## 2014-06-19 ENCOUNTER — Ambulatory Visit (INDEPENDENT_AMBULATORY_CARE_PROVIDER_SITE_OTHER): Payer: Medicare HMO | Admitting: Family Medicine

## 2014-06-19 ENCOUNTER — Encounter: Payer: Self-pay | Admitting: Family Medicine

## 2014-06-19 VITALS — BP 130/70 | HR 76 | Temp 98.4°F | Resp 20 | Ht 71.0 in | Wt 247.0 lb

## 2014-06-19 DIAGNOSIS — J302 Other seasonal allergic rhinitis: Secondary | ICD-10-CM

## 2014-06-19 DIAGNOSIS — B354 Tinea corporis: Secondary | ICD-10-CM

## 2014-06-19 MED ORDER — CLOTRIMAZOLE 1 % EX CREA: 1.0000 "application " | TOPICAL_CREAM | Freq: Two times a day (BID) | CUTANEOUS | Status: DC

## 2014-06-19 MED ORDER — METHYLPREDNISOLONE ACETATE 80 MG/ML IJ SUSP
80.0000 mg | Freq: Once | INTRAMUSCULAR | Status: AC
  Administered 2014-06-19: 60 mg via INTRAMUSCULAR

## 2014-06-19 NOTE — Progress Notes (Signed)
Subjective:    Patient ID: Cameron Daniel, male    DOB: [birth year], 68 y.o.   MRN: [id number]  HPI Patient is a 68 year old white male who has a patch/rash that has appeared on his lateral upper right bicep. It is 2.5 cm x 2 cm in diameter. It is a red ringed rash with central scale. It is pruritic. Is been there for a couple weeks. Patient also is having severe allergies. He reports itchy watery eyes, rhinorrhea, sneezing, congestion, postnasal drip. He is currently taking antihistamine, nasal steroid, and singulair without benefit. He is requesting a steroid shot. He is fully aware of the risk of steroid shot and he is willing to accept those risks in the severity of his allergies. Past Medical History  Diagnosis Date  . Unspecified essential hypertension   . Other and unspecified hyperlipidemia   . Obesity, unspecified   . Esophageal reflux   . Nocturia   . Flushing   . Other malaise and fatigue   . Type II or unspecified type diabetes mellitus without mention of complication, not stated as uncontrolled   . Allergic rhinitis, cause unspecified   . Other acquired absence of organ   . Psychosexual dysfunction with inhibited sexual excitement   . Irritable bowel syndrome   . Diverticulosis of colon (without mention of hemorrhage)   . Personal history of colonic polyps   . Coronary atherosclerosis of unspecified type of vessel, native or graft     1985 , arthroplasty x 1   . Myocardial infarction   . Depression     PTSD   . Hemorrhoids   . Esophageal stricture    Past Surgical History  Procedure Laterality Date  . Elbow bursa surgery    . Cholecystectomy    . Shoulder surgery      Rt. Shoulder  . Carpal tunnel release      left   . Coronary angioplasty      1985   . Colon surgery  2013   Current Outpatient Prescriptions on File Prior to Visit  Medication Sig Dispense Refill  . aspirin 81 MG tablet Take 81 mg by mouth every morning.       . cholecalciferol (VITAMIN D)  1000 UNITS tablet Take 2,000 Units by mouth daily.      . citalopram (CELEXA) 10 MG tablet Take 20 mg by mouth daily.       . colestipol (COLESTID) 5 G granules Take 5 g by mouth daily.      . fexofenadine (ALLEGRA) 180 MG tablet Take 180 mg by mouth daily as needed. For allergies      . fish oil-omega-3 fatty acids 1000 MG capsule Take 500 mg by mouth 2 (two) times daily.      . meloxicam (MOBIC) 15 MG tablet TAKE ONE TABLET BY MOUTH ONE TIME DAILY   30 tablet  11  . montelukast (SINGULAIR) 10 MG tablet Take 1 tablet (10 mg total) by mouth at bedtime.  30 tablet  3  . Multiple Vitamin (MULTIVITAMIN) tablet Take 1 tablet by mouth daily.       Marland Kitchen olmesartan (BENICAR) 20 MG tablet Take 1 tablet (20 mg total) by mouth daily.  90 tablet  6  . pantoprazole (PROTONIX) 40 MG tablet Take 40 mg by mouth every morning.       Marland Kitchen PATADAY 0.2 % SOLN instill one drop into each eye once daily as needed for itch   2.5 mL  3  .  pioglitazone (ACTOS) 15 MG tablet TAKE ONE TABLET BY MOUTH EVERY MORNING  90 tablet  0  . polyethylene glycol powder (GLYCOLAX/MIRALAX) powder Take 17 g by mouth daily.  255 g  0  . promethazine (PHENERGAN) 25 MG tablet Take 1 tablet (25 mg total) by mouth every 8 (eight) hours as needed for nausea or vomiting.  20 tablet  0  . rosuvastatin (CRESTOR) 10 MG tablet Take 1 tablet (10 mg total) by mouth daily.  90 tablet  1  . Testosterone (ANDROGEL) 20.25 MG/1.25GM (1.62%) GEL Place onto the skin daily. 2 pumps daily      . triamcinolone (NASACORT) 55 MCG/ACT nasal inhaler Place 2 sprays into the nose daily.       No current facility-administered medications on file prior to visit.   Allergies  Allergen Reactions  . Doxazosin Mesylate     Reaction=unknown  . Penicillins Rash  . Sulfonamide Derivatives Rash   History   Social History  . Marital Status: Married    Spouse Name: N/A    Number of Children: 1  . Years of Education: N/A   Occupational History  . Lawns    Social  History Main Topics  . Smoking status: Former Smoker    Quit date: 08/30/1983  . Smokeless tobacco: Former Systems developer  . Alcohol Use: Yes     Comment: rare  . Drug Use: No  . Sexual Activity: Not on file   Other Topics Concern  . Not on file   Social History Narrative  . No narrative on file      Review of Systems  All other systems reviewed and are negative.      Objective:   Physical Exam  Vitals reviewed. HENT:  Right Ear: External ear normal.  Left Ear: External ear normal.  Nose: Mucosal edema and rhinorrhea present.  Mouth/Throat: Oropharynx is clear and moist. No oropharyngeal exudate.  Neck: Neck supple.  Cardiovascular: Normal rate, regular rhythm and normal heart sounds.   Pulmonary/Chest: Effort normal and breath sounds normal.  Lymphadenopathy:    He has no cervical adenopathy.  Skin: Rash noted. There is erythema.          Assessment & Plan:  Tinea corporis  Seasonal allergies  KOH is positive. The patient has ringworm. Begin Lotrimin cream twice a day for 14 days. Patient also received Depo-Medrol 60 mg IM x1.

## 2014-06-19 NOTE — Addendum Note (Signed)
Addended by: Shary Decamp B on: 06/19/2014 12:38 PM   Modules accepted: Orders

## 2014-08-25 ENCOUNTER — Ambulatory Visit (INDEPENDENT_AMBULATORY_CARE_PROVIDER_SITE_OTHER): Payer: Medicare HMO | Admitting: Family Medicine

## 2014-08-25 ENCOUNTER — Encounter: Payer: Self-pay | Admitting: Family Medicine

## 2014-08-25 VITALS — BP 130/72 | HR 78 | Temp 98.8°F | Resp 18 | Ht 71.0 in | Wt 251.0 lb

## 2014-08-25 DIAGNOSIS — J019 Acute sinusitis, unspecified: Secondary | ICD-10-CM

## 2014-08-25 MED ORDER — ROSUVASTATIN CALCIUM 10 MG PO TABS: 10.0000 mg | ORAL_TABLET | Freq: Every day | ORAL | Status: DC

## 2014-08-25 MED ORDER — LEVOFLOXACIN 500 MG PO TABS: 500.0000 mg | ORAL_TABLET | Freq: Every day | ORAL | Status: DC

## 2014-08-25 MED ORDER — METHYLPREDNISOLONE ACETATE 80 MG/ML IJ SUSP
60.0000 mg | Freq: Once | INTRAMUSCULAR | Status: AC
  Administered 2014-08-25: 60 mg via INTRAMUSCULAR

## 2014-08-25 NOTE — Addendum Note (Signed)
Addended by: Shary Decamp B on: 08/25/2014 12:53 PM   Modules accepted: Orders

## 2014-08-25 NOTE — Progress Notes (Signed)
Subjective:    Patient ID: Cameron Daniel, male    DOB: March 09, 1946, 68 y.o.   MRN: [id number]  HPI Patient has had pain and pressure in his bilateral maxillary and frontal sinuses for more than one week. He reports green purulent nasal discharge. He reports a dull constant sinus pressure headache. He reports fevers and chills. He reports sneezing and cough. Past Medical History  Diagnosis Date  . Unspecified essential hypertension   . Other and unspecified hyperlipidemia   . Obesity, unspecified   . Esophageal reflux   . Nocturia   . Flushing   . Other malaise and fatigue   . Type II or unspecified type diabetes mellitus without mention of complication, not stated as uncontrolled   . Allergic rhinitis, cause unspecified   . Other acquired absence of organ   . Psychosexual dysfunction with inhibited sexual excitement   . Irritable bowel syndrome   . Diverticulosis of colon (without mention of hemorrhage)   . Personal history of colonic polyps   . Coronary atherosclerosis of unspecified type of vessel, native or graft     1985 , arthroplasty x 1   . Myocardial infarction   . Depression     PTSD   . Hemorrhoids   . Esophageal stricture    Past Surgical History  Procedure Laterality Date  . Elbow bursa surgery    . Cholecystectomy    . Shoulder surgery      Rt. Shoulder  . Carpal tunnel release      left   . Coronary angioplasty      1985   . Colon surgery  2013   Current Outpatient Prescriptions on File Prior to Visit  Medication Sig Dispense Refill  . aspirin 81 MG tablet Take 81 mg by mouth every morning.     . cholecalciferol (VITAMIN D) 1000 UNITS tablet Take 2,000 Units by mouth daily.    . citalopram (CELEXA) 10 MG tablet Take 20 mg by mouth daily.     . clotrimazole (LOTRIMIN) 1 % cream Apply 1 application topically 2 (two) times daily. 30 g 0  . colestipol (COLESTID) 5 G granules Take 5 g by mouth daily.    . fexofenadine (ALLEGRA) 180 MG tablet Take 180 mg  by mouth daily as needed. For allergies    . fish oil-omega-3 fatty acids 1000 MG capsule Take 500 mg by mouth 2 (two) times daily.    . meloxicam (MOBIC) 15 MG tablet TAKE ONE TABLET BY MOUTH ONE TIME DAILY  30 tablet 11  . montelukast (SINGULAIR) 10 MG tablet Take 1 tablet (10 mg total) by mouth at bedtime. 30 tablet 3  . Multiple Vitamin (MULTIVITAMIN) tablet Take 1 tablet by mouth daily.     Marland Kitchen olmesartan (BENICAR) 20 MG tablet Take 1 tablet (20 mg total) by mouth daily. 90 tablet 6  . pantoprazole (PROTONIX) 40 MG tablet Take 40 mg by mouth every morning.     Marland Kitchen PATADAY 0.2 % SOLN instill one drop into each eye once daily as needed for itch  2.5 mL 3  . pioglitazone (ACTOS) 15 MG tablet TAKE ONE TABLET BY MOUTH EVERY MORNING 90 tablet 0  . polyethylene glycol powder (GLYCOLAX/MIRALAX) powder Take 17 g by mouth daily. 255 g 0  . promethazine (PHENERGAN) 25 MG tablet Take 1 tablet (25 mg total) by mouth every 8 (eight) hours as needed for nausea or vomiting. 20 tablet 0  . Testosterone (ANDROGEL) 20.25 MG/1.25GM (1.62%) GEL Place  onto the skin daily. 2 pumps daily    . triamcinolone (NASACORT) 55 MCG/ACT nasal inhaler Place 2 sprays into the nose daily.     No current facility-administered medications on file prior to visit.   Allergies  Allergen Reactions  . Doxazosin Mesylate     Reaction=unknown  . Penicillins Rash  . Sulfonamide Derivatives Rash   History   Social History  . Marital Status: Married    Spouse Name: N/A    Number of Children: 1  . 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 Smoking status: Former Smoker    Quit date: 08/30/1983  . Smokeless tobacco: Former Systems developer  . Alcohol Use: Yes     Comment: rare  . Drug Use: No  . Sexual Activity: Not on file   Other Topics Concern  . Not on file   Social History Narrative      Review of Systems  All other systems reviewed and are negative.      Objective:    Physical Exam  Constitutional: He appears well-developed and well-nourished.  HENT:  Right Ear: External ear normal.  Left Ear: External ear normal.  Nose: Mucosal edema and rhinorrhea present. Right sinus exhibits maxillary sinus tenderness and frontal sinus tenderness. Left sinus exhibits maxillary sinus tenderness and frontal sinus tenderness.  Mouth/Throat: Oropharynx is clear and moist. No oropharyngeal exudate.  Eyes: Conjunctivae are normal.  Neck: No thyromegaly present.  Cardiovascular: Normal rate, regular rhythm and normal heart sounds.   Pulmonary/Chest: Effort normal and breath sounds normal.  Lymphadenopathy:    He has no cervical adenopathy.  Vitals reviewed.         Assessment & Plan:  Acute rhinosinusitis - Plan: levofloxacin (LEVAQUIN) 500 MG tablet  Begin Levaquin 500 mg by mouth daily for 7 days. Patient received 60 mg of Depo-Medrol 1 in the office today.

## 2014-10-10 ENCOUNTER — Telehealth: Payer: Self-pay | Admitting: Family Medicine

## 2014-10-17 ENCOUNTER — Encounter: Payer: Self-pay | Admitting: Family Medicine

## 2014-10-17 ENCOUNTER — Telehealth: Payer: Self-pay | Admitting: Family Medicine

## 2014-10-17 MED ORDER — ROSUVASTATIN CALCIUM 10 MG PO TABS: 10.0000 mg | ORAL_TABLET | Freq: Every day | ORAL | Status: DC

## 2014-10-17 NOTE — Telephone Encounter (Signed)
Medication refill for one time only.  Patient needs to be seen.  Letter sent for patient to call and schedule 

## 2014-10-17 NOTE — Telephone Encounter (Signed)
[phone] PT is needing a refill on crestor 20 mg  [place]

## 2014-10-23 ENCOUNTER — Ambulatory Visit (INDEPENDENT_AMBULATORY_CARE_PROVIDER_SITE_OTHER): Payer: Medicare HMO | Admitting: Family Medicine

## 2014-10-23 ENCOUNTER — Encounter: Payer: Self-pay | Admitting: Family Medicine

## 2014-10-23 VITALS — BP 130/82 | HR 82 | Temp 97.9°F | Resp 18 | Ht 71.0 in | Wt 256.0 lb

## 2014-10-23 DIAGNOSIS — S39011A Strain of muscle, fascia and tendon of abdomen, initial encounter: Secondary | ICD-10-CM

## 2014-10-23 DIAGNOSIS — S76219A Strain of adductor muscle, fascia and tendon of unspecified thigh, initial encounter: Secondary | ICD-10-CM

## 2014-10-23 DIAGNOSIS — M545 Low back pain, unspecified: Secondary | ICD-10-CM

## 2014-10-23 MED ORDER — CYCLOBENZAPRINE HCL 10 MG PO TABS: 10.0000 mg | ORAL_TABLET | Freq: Three times a day (TID) | ORAL | Status: DC | PRN

## 2014-10-23 NOTE — Progress Notes (Signed)
Subjective:    Patient ID: Cameron Daniel, male    DOB: 03/26/1946, 69 y.o.   MRN: [id number]  HPI Early this week, the patient injured his lower back, his left hamstring, and his left groin.  He injured himself shoveling mulch.  He denies any sciatica or leg weakness. He has a palpable muscle spasm in his low left lower back. He also has a very tight and sore groin/hamstring.   Past Medical History  Diagnosis Date  . Unspecified essential hypertension   . Other and unspecified hyperlipidemia   . Obesity, unspecified   . Esophageal reflux   . Nocturia   . Flushing   . Other malaise and fatigue   . Type II or unspecified type diabetes mellitus without mention of complication, not stated as uncontrolled   . Allergic rhinitis, cause unspecified   . Other acquired absence of organ   . Psychosexual dysfunction with inhibited sexual excitement   . Irritable bowel syndrome   . Diverticulosis of colon (without mention of hemorrhage)   . Personal history of colonic polyps   . Coronary atherosclerosis of unspecified type of vessel, native or graft     1985 , arthroplasty x 1   . Myocardial infarction   . Depression     PTSD   . Hemorrhoids   . Esophageal stricture    Past Surgical History  Procedure Laterality Date  . Elbow bursa surgery    . Cholecystectomy    . Shoulder surgery      Rt. Shoulder  . Carpal tunnel release      left   . Coronary angioplasty      1985   . Colon surgery  2013   Current Outpatient Prescriptions on File Prior to Visit  Medication Sig Dispense Refill  . aspirin 81 MG tablet Take 81 mg by mouth every morning.     . cholecalciferol (VITAMIN D) 1000 UNITS tablet Take 2,000 Units by mouth daily.    . citalopram (CELEXA) 10 MG tablet Take 20 mg by mouth daily.     . clotrimazole (LOTRIMIN) 1 % cream Apply 1 application topically 2 (two) times daily. 30 g 0  . colestipol (COLESTID) 5 G granules Take 5 g by mouth daily.    . fexofenadine (ALLEGRA) 180  MG tablet Take 180 mg by mouth daily as needed. For allergies    . fish oil-omega-3 fatty acids 1000 MG capsule Take 500 mg by mouth 2 (two) times daily.    . Multiple Vitamin (MULTIVITAMIN) tablet Take 1 tablet by mouth daily.     Marland Kitchen olmesartan (BENICAR) 20 MG tablet Take 1 tablet (20 mg total) by mouth daily. 90 tablet 6  . pantoprazole (PROTONIX) 40 MG tablet Take 40 mg by mouth every morning.     Marland Kitchen PATADAY 0.2 % SOLN instill one drop into each eye once daily as needed for itch  2.5 mL 3  . pioglitazone (ACTOS) 15 MG tablet TAKE ONE TABLET BY MOUTH EVERY MORNING 90 tablet 0  . polyethylene glycol powder (GLYCOLAX/MIRALAX) powder Take 17 g by mouth daily. 255 g 0  . rosuvastatin (CRESTOR) 10 MG tablet Take 1 tablet (10 mg total) by mouth daily. 30 tablet 0  . Testosterone (ANDROGEL) 20.25 MG/1.25GM (1.62%) GEL Place onto the skin daily. 2 pumps daily     No current facility-administered medications on file prior to visit.   Allergies  Allergen Reactions  . Doxazosin Mesylate     Reaction=unknown  .  Penicillins Rash  . Sulfonamide Derivatives Rash   History   Social History  . Marital Status: Married    Spouse Name: N/A  . Number of Children: 1  . 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 Smoking status: Former Smoker    Quit date: 08/30/1983  . Smokeless tobacco: Former Systems developer  . Alcohol Use: Yes     Comment: rare  . Drug Use: No  . Sexual Activity: Not on file   Other Topics Concern  . Not on file   Social History Narrative      Review of Systems  All other systems reviewed and are negative.      Objective:   Physical Exam  Cardiovascular: Normal rate and regular rhythm.   Pulmonary/Chest: Effort normal and breath sounds normal.  Musculoskeletal:       Lumbar back: He exhibits decreased range of motion, tenderness, pain and spasm.       Left upper leg: He exhibits tenderness. He exhibits no bony tenderness and  no swelling.  Vitals reviewed.         Assessment & Plan:  Midline low back pain without sciatica - Plan: cyclobenzaprine (FLEXERIL) 10 MG tablet  Groin strain, initial encounter  Patient has strained his lower back and pulled his hamstring and groin.  I recommended moist heat, stretching, and Flexeril 10 mg every 8 hours when necessary muscle spasm.

## 2014-10-28 ENCOUNTER — Other Ambulatory Visit: Payer: Medicare HMO

## 2014-10-28 ENCOUNTER — Other Ambulatory Visit: Payer: Self-pay | Admitting: Family Medicine

## 2014-10-28 DIAGNOSIS — I2510 Atherosclerotic heart disease of native coronary artery without angina pectoris: Secondary | ICD-10-CM

## 2014-10-28 DIAGNOSIS — E785 Hyperlipidemia, unspecified: Secondary | ICD-10-CM

## 2014-10-28 DIAGNOSIS — Z125 Encounter for screening for malignant neoplasm of prostate: Secondary | ICD-10-CM

## 2014-10-28 DIAGNOSIS — I10 Essential (primary) hypertension: Secondary | ICD-10-CM

## 2014-10-28 LAB — LIPID PANEL
CHOLESTEROL: 160 mg/dL (ref 0–200)
HDL: 59 mg/dL (ref >=40)
LDL Cholesterol: 43 mg/dL (ref 0–99)
TRIGLYCERIDES: 289 mg/dL — AB (ref <150)
Total CHOL/HDL Ratio: 2.7 Ratio
VLDL: 58 mg/dL — AB (ref 0–40)

## 2014-10-28 LAB — CBC WITH DIFFERENTIAL/PLATELET
Basophils Absolute: 0.1 10*3/uL (ref 0.0–0.1)
Basophils Relative: 1 % (ref 0–1)
Eosinophils Absolute: 0.1 10*3/uL (ref 0.0–0.7)
Eosinophils Relative: 2 % (ref 0–5)
HCT: 46.5 % (ref 39.0–52.0)
HEMOGLOBIN: 15.4 g/dL (ref 13.0–17.0)
LYMPHS PCT: 40 % (ref 12–46)
Lymphs Abs: 2.6 10*3/uL (ref 0.7–4.0)
MCH: 29.6 pg (ref 26.0–34.0)
MCHC: 33.1 g/dL (ref 30.0–36.0)
MCV: 89.4 fL (ref 78.0–100.0)
MONOS PCT: 7 % (ref 3–12)
MPV: 10 fL (ref 8.6–12.4)
Monocytes Absolute: 0.5 10*3/uL (ref 0.1–1.0)
Neutro Abs: 3.3 10*3/uL (ref 1.7–7.7)
Neutrophils Relative %: 50 % (ref 43–77)
Platelets: 157 10*3/uL (ref 150–400)
RBC: 5.2 MIL/uL (ref 4.22–5.81)
RDW: 14.3 % (ref 11.5–15.5)
WBC: 6.6 10*3/uL (ref 4.0–10.5)

## 2014-10-28 LAB — COMPREHENSIVE METABOLIC PANEL
ALT: 32 U/L (ref 0–53)
AST: 33 U/L (ref 0–37)
Albumin: 4.4 g/dL (ref 3.5–5.2)
Alkaline Phosphatase: 57 U/L (ref 39–117)
BUN: 16 mg/dL (ref 6–23)
CO2: 26 mEq/L (ref 19–32)
Calcium: 10 mg/dL (ref 8.4–10.5)
Chloride: 98 mEq/L (ref 96–112)
Creat: 0.95 mg/dL (ref 0.50–1.35)
Glucose, Bld: 156 mg/dL — ABNORMAL HIGH (ref 70–99)
POTASSIUM: 4.2 meq/L (ref 3.5–5.3)
SODIUM: 138 meq/L (ref 135–145)
Total Bilirubin: 0.7 mg/dL (ref 0.2–1.2)
Total Protein: 6.2 g/dL (ref 6.0–8.3)

## 2014-10-28 LAB — HEMOGLOBIN A1C
Hgb A1c MFr Bld: 7.1 % — ABNORMAL HIGH (ref <5.7)
MEAN PLASMA GLUCOSE: 157 mg/dL — AB (ref <117)

## 2014-10-29 LAB — MEASLES/MUMPS/RUBELLA IMMUNITY
Rubella: 2.39 Index — ABNORMAL HIGH (ref <0.90)
Rubeola IgG: 10.8 AU/mL (ref <25.00)

## 2014-10-29 LAB — PSA, MEDICARE: PSA: 3.14 ng/mL (ref <=4.00)

## 2014-10-30 ENCOUNTER — Ambulatory Visit (INDEPENDENT_AMBULATORY_CARE_PROVIDER_SITE_OTHER): Payer: Medicare HMO | Admitting: *Deleted

## 2014-10-30 ENCOUNTER — Telehealth: Payer: Self-pay | Admitting: Family Medicine

## 2014-10-30 DIAGNOSIS — Z299 Encounter for prophylactic measures, unspecified: Secondary | ICD-10-CM

## 2014-10-30 DIAGNOSIS — Z23 Encounter for immunization: Secondary | ICD-10-CM | POA: Diagnosis not present

## 2014-10-30 NOTE — Telephone Encounter (Signed)
Pt came into office to get MMR injection and would like to know if you could write him a letter about the numbness in his hands and feet so that he can take it to the [place] later this month.

## 2014-10-30 NOTE — Progress Notes (Signed)
Patient ID: Cameron Daniel, male   DOB: 04/29/1946, 69 y.o.   MRN: [id number] Patient seen in office for MMR Vaccination.   Tolerated IM administration well.   Immunization history updated.

## 2014-11-03 ENCOUNTER — Ambulatory Visit (INDEPENDENT_AMBULATORY_CARE_PROVIDER_SITE_OTHER): Payer: Medicare HMO | Admitting: Family Medicine

## 2014-11-03 ENCOUNTER — Encounter: Payer: Self-pay | Admitting: Family Medicine

## 2014-11-03 VITALS — BP 118/78 | HR 84 | Temp 97.8°F | Resp 18 | Ht 71.0 in | Wt 253.0 lb

## 2014-11-03 DIAGNOSIS — IMO0002 Reserved for concepts with insufficient information to code with codable children: Secondary | ICD-10-CM

## 2014-11-03 DIAGNOSIS — N400 Enlarged prostate without lower urinary tract symptoms: Secondary | ICD-10-CM

## 2014-11-03 DIAGNOSIS — E1165 Type 2 diabetes mellitus with hyperglycemia: Secondary | ICD-10-CM | POA: Diagnosis not present

## 2014-11-03 NOTE — Progress Notes (Signed)
Subjective:    Patient ID: Cameron Daniel, male    DOB: 22-Mar-1946, 69 y.o.   MRN: [id number]  HPI Patient is here today to discuss his blood work that was obtained recently: Orders Only on 10/28/2014  Component Date Value Ref Range Status  . Rubella 10/28/2014 2.39* <0.90 Index Final   Comment:   Reference Range:        <0.90 Index = Not Immune                     0.90-0.99 Index = Equivocal                        >=1.00 Index = Immune     . Mumps IgG 10/28/2014 <5.00  <9.00 AU/mL Final   Comment:   Reference Range:         <9.00 AU/mL = Negative                     9.00-10.99 AU/mL = Equivocal                        >=11.00 AU/mL = Positive     . Rubeola IgG 10/28/2014 10.80  <25.00 AU/mL Final   Comment:   Reference Range:        <25.00 AU/mL = Negative                    25.00-29.99 AU/mL = Equivocal                        >=30.00 AU/mL = Positive     Lab on 10/28/2014  Component Date Value Ref Range Status  . WBC 10/28/2014 6.6  4.0 - 10.5 K/uL Final  . RBC 10/28/2014 5.20  4.22 - 5.81 MIL/uL Final  . Hemoglobin 10/28/2014 15.4  13.0 - 17.0 g/dL Final  . HCT 10/28/2014 46.5  39.0 - 52.0 % Final  . MCV 10/28/2014 89.4  78.0 - 100.0 fL Final  . MCH 10/28/2014 29.6  26.0 - 34.0 pg Final  . MCHC 10/28/2014 33.1  30.0 - 36.0 g/dL Final  . RDW 10/28/2014 14.3  11.5 - 15.5 % Final  . Platelets 10/28/2014 157  150 - 400 K/uL Final  . MPV 10/28/2014 10.0  8.6 - 12.4 fL Final  . Neutrophils Relative % 10/28/2014 50  43 - 77 % Final  . Neutro Abs 10/28/2014 3.3  1.7 - 7.7 K/uL Final  . Lymphocytes Relative 10/28/2014 40  12 - 46 % Final  . Lymphs Abs 10/28/2014 2.6  0.7 - 4.0 K/uL Final  . Monocytes Relative 10/28/2014 7  3 - 12 % Final  . Monocytes Absolute 10/28/2014 0.5  0.1 - 1.0 K/uL Final  . Eosinophils Relative 10/28/2014 2  0 - 5 % Final  . Eosinophils Absolute 10/28/2014 0.1  0.0 - 0.7 K/uL Final  . Basophils Relative 10/28/2014 1  0 - 1 % Final  . Basophils  Absolute 10/28/2014 0.1  0.0 - 0.1 K/uL Final  . Smear Review 10/28/2014 Criteria for review not met   Final  . Sodium 10/28/2014 138  135 - 145 mEq/L Final  . Potassium 10/28/2014 4.2  3.5 - 5.3 mEq/L Final  . Chloride 10/28/2014 98  96 - 112 mEq/L Final  . CO2 10/28/2014 26  19 - 32 mEq/L Final  . Glucose, Bld 10/28/2014 156* 70 -  99 mg/dL Final  . BUN 10/28/2014 16  6 - 23 mg/dL Final  . Creat 10/28/2014 0.95  0.50 - 1.35 mg/dL Final  . Total Bilirubin 10/28/2014 0.7  0.2 - 1.2 mg/dL Final  . Alkaline Phosphatase 10/28/2014 57  39 - 117 U/L Final  . AST 10/28/2014 33  0 - 37 U/L Final  . ALT 10/28/2014 32  0 - 53 U/L Final  . Total Protein 10/28/2014 6.2  6.0 - 8.3 g/dL Final  . Albumin 10/28/2014 4.4  3.5 - 5.2 g/dL Final  . Calcium 10/28/2014 10.0  8.4 - 10.5 mg/dL Final  . Hgb A1c MFr Bld 10/28/2014 7.1* <5.7 % Final   Comment:                                                                        According to the ADA Clinical Practice Recommendations for 2011, when HbA1c is used as a screening test:     >=6.5%   Diagnostic of Diabetes Mellitus            (if abnormal result is confirmed)   5.7-6.4%   Increased risk of developing Diabetes Mellitus   References:Diagnosis and Classification of Diabetes Mellitus,Diabetes NUUV,2536,64(QIHKV 1):S62-S69 and Standards of Medical Care in         Diabetes - 2011,Diabetes Care,2011,34 (Suppl 1):S11-S61.     . Mean Plasma Glucose 10/28/2014 157* <117 mg/dL Final  . Cholesterol 10/28/2014 160  0 - 200 mg/dL Final   Comment: ATP III Classification:       < 200        mg/dL        Desirable      200 - 239     mg/dL        Borderline High      >= 240        mg/dL        High     . Triglycerides 10/28/2014 289* <150 mg/dL Final  . HDL 10/28/2014 59  >=40 mg/dL Final   ** Please note change in reference range(s). **  . Total CHOL/HDL Ratio 10/28/2014 2.7   Final  . VLDL 10/28/2014 58* 0 - 40 mg/dL Final  . LDL Cholesterol 10/28/2014  43  0 - 99 mg/dL Final   Comment:   Total Cholesterol/HDL Ratio:CHD Risk                        Coronary Heart Disease Risk Table                                        Men       Women          1/2 Average Risk              3.4        3.3              Average Risk              5.0        4.4  2X Average Risk              9.6        7.1           3X Average Risk             23.4       11.0 Use the calculated Patient Ratio above and the CHD Risk table  to determine the patient's CHD Risk. ATP III Classification (LDL):       < 100        mg/dL         Optimal      100 - 129     mg/dL         Near or Above Optimal      130 - 159     mg/dL         Borderline High      160 - 189     mg/dL         High       > 190        mg/dL         Very High     . PSA 10/28/2014 3.14  <=4.00 ng/mL Final   Comment: Test Methodology: ECLIA PSA (Electrochemiluminescence Immunoassay)   For PSA values from 2.5-4.0, particularly in younger men <34 years old, the AUA and NCCN suggest testing for % Free PSA (3515) and evaluation of the rate of increase in PSA (PSA velocity).    Patient's PSA has steadily risen from just over 2-3.14. I am concerned because the patient is on testosterone replacement for hypogonadism. His LDL cholesterol is excellent at 43 which is well below his goal of 70 given his history of coronary artery disease. Unfortunately his hemoglobin A1c is 7.1. His goal hemoglobin A1c is less than 6.5. The patient missed that he drinks 3 soft drinks a day either [facility] or [employer]. He is also not exercising on a regular basis. He does report 3 episodes of nocturia per evening. He also reports increased urinary frequency and weak urinary stream. Past Medical History  Diagnosis Date  . Unspecified essential hypertension   . Other and unspecified hyperlipidemia   . Obesity, unspecified   . Esophageal reflux   . Nocturia   . Flushing   . Other malaise and fatigue   . Type II or  unspecified type diabetes mellitus without mention of complication, not stated as uncontrolled   . Allergic rhinitis, cause unspecified   . Other acquired absence of organ   . Psychosexual dysfunction with inhibited sexual excitement   . Irritable bowel syndrome   . Diverticulosis of colon (without mention of hemorrhage)   . Personal history of colonic polyps   . Coronary atherosclerosis of unspecified type of vessel, native or graft     1985 , arthroplasty x 1   . Myocardial infarction   . Depression     PTSD   . Hemorrhoids   . Esophageal stricture    Past Surgical History  Procedure Laterality Date  . Elbow bursa surgery    . Cholecystectomy    . Shoulder surgery      Rt. Shoulder  . Carpal tunnel release      left   . Coronary angioplasty      1985   . Colon surgery  2013   Current Outpatient Prescriptions on File Prior to Visit  Medication Sig Dispense Refill  . aspirin 81 MG tablet  Take 81 mg by mouth every morning.     . cholecalciferol (VITAMIN D) 1000 UNITS tablet Take 2,000 Units by mouth daily.    . citalopram (CELEXA) 10 MG tablet Take 20 mg by mouth daily.     . clotrimazole (LOTRIMIN) 1 % cream Apply 1 application topically 2 (two) times daily. 30 g 0  . colestipol (COLESTID) 5 G granules Take 5 g by mouth daily.    . cyclobenzaprine (FLEXERIL) 10 MG tablet Take 1 tablet (10 mg total) by mouth 3 (three) times daily as needed for muscle spasms. 30 tablet 0  . fexofenadine (ALLEGRA) 180 MG tablet Take 180 mg by mouth daily as needed. For allergies    . fish oil-omega-3 fatty acids 1000 MG capsule Take 500 mg by mouth 2 (two) times daily.    . Multiple Vitamin (MULTIVITAMIN) tablet Take 1 tablet by mouth daily.     Marland Kitchen olmesartan (BENICAR) 20 MG tablet Take 1 tablet (20 mg total) by mouth daily. 90 tablet 6  . pantoprazole (PROTONIX) 40 MG tablet Take 40 mg by mouth every morning.     Marland Kitchen PATADAY 0.2 % SOLN instill one drop into each eye once daily as needed for itch   2.5 mL 3  . pioglitazone (ACTOS) 15 MG tablet TAKE ONE TABLET BY MOUTH EVERY MORNING 90 tablet 0  . polyethylene glycol powder (GLYCOLAX/MIRALAX) powder Take 17 g by mouth daily. 255 g 0  . rosuvastatin (CRESTOR) 10 MG tablet Take 1 tablet (10 mg total) by mouth daily. 30 tablet 0  . Testosterone (ANDROGEL) 20.25 MG/1.25GM (1.62%) GEL Place onto the skin daily. 2 pumps daily     No current facility-administered medications on file prior to visit.   Allergies  Allergen Reactions  . Doxazosin Mesylate     Reaction=unknown  . Penicillins Rash  . Sulfonamide Derivatives Rash   History   Social History  . Marital Status: Married    Spouse Name: N/A  . Number of Children: 1  . 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 Smoking status: Former Smoker    Quit date: 08/30/1983  . Smokeless tobacco: Former Systems developer  . Alcohol Use: Yes     Comment: rare  . Drug Use: No  . Sexual Activity: Not on file   Other Topics Concern  . Not on file   Social History Narrative      Review of Systems  All other systems reviewed and are negative.      Objective:   Physical Exam  Constitutional: He appears well-developed and well-nourished.  Cardiovascular: Normal rate, regular rhythm, normal heart sounds and intact distal pulses.   Pulmonary/Chest: Effort normal and breath sounds normal. No respiratory distress. He has no wheezes. He has no rales. He exhibits no tenderness.  Abdominal: Soft. Bowel sounds are normal. He exhibits no distension. There is no tenderness. There is no rebound and no guarding.  Genitourinary: Rectum normal.  Vitals reviewed. Prostate is swollen +1 in size. There is no nodularity. There are no hard firm areas        Assessment & Plan:  BPH (benign prostatic hyperplasia)  Diabetes mellitus type II, uncontrolled  I believe the patient's elevated PSA is likely a reflection of BPH. On his examination today there is  no evidence of an underlying prostate cancer. I would like to recheck his PSA in 6 months to monitor more closely. Also recommended the  patient discontinue testosterone as this is likely contributing to his problem. Diabetes is not well controlled. I recommended starting the patient on Januvia but he would like to discontinue soft drinks and try to lose 5-10 pounds and recheck his blood sugar in 6 months. His blood pressure is excellent. When he returns in 6 months I will check a urine microalbumin. Also recommended a diabetic eye exam.

## 2014-11-04 ENCOUNTER — Encounter: Payer: Self-pay | Admitting: Family Medicine

## 2014-11-04 NOTE — Telephone Encounter (Signed)
Pt aware to pu via vm

## 2014-11-04 NOTE — Telephone Encounter (Signed)
Letter is ready to be picked up.

## 2014-11-05 ENCOUNTER — Other Ambulatory Visit: Payer: Self-pay | Admitting: Family Medicine

## 2014-11-05 MED ORDER — POLYETHYL GLYCOL-PROPYL GLYCOL 0.4-0.3 % OP SOLN: 2.0000 [drp] | Freq: Two times a day (BID) | OPHTHALMIC | Status: DC

## 2014-11-05 MED ORDER — OLOPATADINE HCL 0.2 % OP SOLN: OPHTHALMIC | Status: DC

## 2014-11-05 MED ORDER — MELOXICAM 15 MG PO TABS: 15.0000 mg | ORAL_TABLET | Freq: Every day | ORAL | Status: DC

## 2014-12-02 ENCOUNTER — Telehealth: Payer: Self-pay | Admitting: Family Medicine

## 2014-12-02 ENCOUNTER — Encounter: Payer: Self-pay | Admitting: Family Medicine

## 2014-12-02 NOTE — Telephone Encounter (Signed)
[facility] and would like to ask dr pickard some questions about this med if possible  Please call him at [phone]

## 2014-12-03 NOTE — Telephone Encounter (Signed)
Spoke to pt and the [place] sent him some gabapentin 400mg  to take 1/2 tab po qd x 7days 1/2 tab po bid then tid and he wanted to make sure that you thought it was a safe medication for him to take and wanted to know what side effects to look for just in case he had any.

## 2014-12-04 NOTE — Telephone Encounter (Signed)
Patients wife aware

## 2014-12-04 NOTE — Telephone Encounter (Signed)
Yes, may help neuropathic pain.

## 2014-12-09 ENCOUNTER — Telehealth: Payer: Self-pay | Admitting: Family Medicine

## 2014-12-09 NOTE — Telephone Encounter (Signed)
Patient is calling to speak with you regarding his bill  [phone]

## 2014-12-12 NOTE — Telephone Encounter (Signed)
I called and left msg with wife.

## 2014-12-16 ENCOUNTER — Ambulatory Visit (INDEPENDENT_AMBULATORY_CARE_PROVIDER_SITE_OTHER): Payer: Medicare HMO | Admitting: Family Medicine

## 2014-12-16 ENCOUNTER — Telehealth: Payer: Self-pay | Admitting: Family Medicine

## 2014-12-16 ENCOUNTER — Encounter: Payer: Self-pay | Admitting: Family Medicine

## 2014-12-16 VITALS — BP 130/72 | HR 78 | Temp 98.4°F | Resp 16 | Ht 71.0 in | Wt 250.0 lb

## 2014-12-16 DIAGNOSIS — J0100 Acute maxillary sinusitis, unspecified: Secondary | ICD-10-CM | POA: Diagnosis not present

## 2014-12-16 MED ORDER — FLUTICASONE PROPIONATE 50 MCG/ACT NA SUSP: 2.0000 | Freq: Every day | NASAL | Status: DC

## 2014-12-16 MED ORDER — CEFDINIR 300 MG PO CAPS: 300.0000 mg | ORAL_CAPSULE | Freq: Two times a day (BID) | ORAL | Status: DC

## 2014-12-16 NOTE — Telephone Encounter (Signed)
Seen in ov with Dr. Buelah Manis

## 2014-12-16 NOTE — Telephone Encounter (Signed)
Warn patient about abx resistance.  His life may depend on these working someday.  I will almost promise him this is allergies not infxn.  If he insists, he can have zpack.

## 2014-12-16 NOTE — Progress Notes (Signed)
Patient ID: Cameron Daniel, male   DOB: August 12, 1946, 69 y.o.   MRN: [id number]   Subjective:    Patient ID: Cameron Daniel, male    DOB: 09-12-1945, 69 y.o.   MRN: [id number]  Patient presents for Sinus Pressure  patient with sinus pressure and headache on the left side only. The drainage is mostly a thick white however he has history of sinusitis and simply gets this once a year. He has not had any fever no significant cough. He has been using Nasacort and some nasal saline with minimal improvement. He is scheduled to have Mohs procedure for basal cell carcinoma on this Thursday he is also leaving for a trip in about 1 week    Review Of Systems:  GEN- denies fatigue, fever, weight loss,weakness, recent illness HEENT- denies eye drainage, change in vision, +nasal discharge, CVS- denies chest pain, palpitations RESP- denies SOB, cough, wheeze ABD- denies N/V, change in stools, abd pain GU- denies dysuria, hematuria, dribbling, incontinence MSK- denies joint pain, muscle aches, injury Neuro- + headache, dizziness, syncope, seizure activity       Objective:    BP 130/72 mmHg  Pulse 78  Temp(Src) 98.4 F (36.9 C) (Oral)  Resp 16  Ht 5\' 11"  (1.803 m)  Wt 250 lb (113.399 kg)  BMI 34.88 kg/m2 GEN- NAD, alert and oriented x3 HEENT- PERRL, EOMI, non injected sclera, pink conjunctiva, MMM, oropharynxclear, TM clear bilat no effusion,  + Left frontal and maxillary sinus tenderness, mild swelling of left maxillary region,  inflammed turbinates,  Nasal drainage  Neck- Supple, no LAD CVS- RRR, no murmur RESP-CTAB EXT- No edema Pulses- Radial 2+        Assessment & Plan:      Problem List Items Addressed This Visit    None    Visit Diagnoses    Acute maxillary sinusitis, recurrence not specified    -  Primary    Treat with omnicef x 7 days, Flonase alternating with nasal saline, mucinex. Call if not improved    Relevant Medications    fluticasone (FLONASE) 50 MCG/ACT  nasal spray    cefdinir (OMNICEF) 300 MG capsule       Note: This dictation was prepared with Dragon dictation along with smaller phrase technology. Any transcriptional errors that result from this process are unintentional.

## 2014-12-16 NOTE — Patient Instructions (Signed)
Take antibiotics as prescribed Mucinex Flonase sent F/U as needed

## 2014-12-16 NOTE — Telephone Encounter (Signed)
Patient would like something called in for sinus infection if possible [phone] [facility] [place]

## 2014-12-18 DIAGNOSIS — Z85828 Personal history of other malignant neoplasm of skin: Secondary | ICD-10-CM | POA: Insufficient documentation

## 2014-12-23 NOTE — Telephone Encounter (Signed)
On 12/16/14 patient came into office and I advised the patient of the following after reviewing his account I do show that the patient paid $6.41 for DOS 06/19/14 which he paid on 08/25/14. However he was seen on 08/25/14 which his insurance is saying he owes $6.41 for that DOS as well. Patient went ahead and took care of his outstanding balance on that day.

## 2015-02-12 ENCOUNTER — Encounter: Payer: Self-pay | Admitting: Family Medicine

## 2015-02-12 ENCOUNTER — Ambulatory Visit (INDEPENDENT_AMBULATORY_CARE_PROVIDER_SITE_OTHER): Payer: Medicare HMO | Admitting: Family Medicine

## 2015-02-12 VITALS — BP 130/70 | HR 76 | Temp 98.1°F | Resp 18 | Ht 71.0 in | Wt 248.0 lb

## 2015-02-12 DIAGNOSIS — N410 Acute prostatitis: Secondary | ICD-10-CM

## 2015-02-12 DIAGNOSIS — R350 Frequency of micturition: Secondary | ICD-10-CM

## 2015-02-12 LAB — URINALYSIS, ROUTINE W REFLEX MICROSCOPIC
Bilirubin Urine: NEGATIVE
Glucose, UA: NEGATIVE mg/dL
Hgb urine dipstick: NEGATIVE
Ketones, ur: NEGATIVE mg/dL
LEUKOCYTES UA: NEGATIVE
Nitrite: NEGATIVE
PROTEIN: NEGATIVE mg/dL
Specific Gravity, Urine: 1.02 (ref 1.005–1.030)
UROBILINOGEN UA: 0.2 mg/dL (ref 0.0–1.0)
pH: 7 (ref 5.0–8.0)

## 2015-02-12 MED ORDER — TAMSULOSIN HCL 0.4 MG PO CAPS
0.4000 mg | ORAL_CAPSULE | Freq: Every day | ORAL | Status: DC
Start: 2015-02-12 — End: 2015-05-26

## 2015-02-12 MED ORDER — CIPROFLOXACIN HCL 500 MG PO TABS
500.0000 mg | ORAL_TABLET | Freq: Two times a day (BID) | ORAL | Status: DC
Start: 2015-02-12 — End: 2015-05-26

## 2015-02-12 NOTE — Progress Notes (Signed)
Subjective:    Patient ID: Cameron Daniel, male    DOB: 24-Mar-1946, 69 y.o.   MRN: [id number]  HPI Patient presents with 2 weeks of worsening urinary retention, urinary frequency, urinary hesitancy, and some mild dysuria. PSA was recently checked in March and was found to be elevated at 3.16. Since that time the patient has discontinued his testosterone. Over the last 2 weeks his urinary symptoms have developed. He denies any fevers or chills. His urinalysis shows no evidence of urinary tract infection. On examination today, the patient has a +2 prostate which is tender to palpation but there is no palpable nodularity Past Medical History  Diagnosis Date  . Unspecified essential hypertension   . Other and unspecified hyperlipidemia   . Obesity, unspecified   . Esophageal reflux   . Nocturia   . Flushing   . Other malaise and fatigue   . Type II or unspecified type diabetes mellitus without mention of complication, not stated as uncontrolled   . Allergic rhinitis, cause unspecified   . Other acquired absence of organ   . Psychosexual dysfunction with inhibited sexual excitement   . Irritable bowel syndrome   . Diverticulosis of colon (without mention of hemorrhage)   . Personal history of colonic polyps   . Coronary atherosclerosis of unspecified type of vessel, native or graft     1985 , arthroplasty x 1   . Myocardial infarction   . Depression     PTSD   . Hemorrhoids   . Esophageal stricture    Past Surgical History  Procedure Laterality Date  . Elbow bursa surgery    . Cholecystectomy    . Shoulder surgery      Rt. Shoulder  . Carpal tunnel release      left   . Coronary angioplasty      1985   . Colon surgery  2013   Current Outpatient Prescriptions on File Prior to Visit  Medication Sig Dispense Refill  . aspirin 81 MG tablet Take 81 mg by mouth every morning.     . cholecalciferol (VITAMIN D) 1000 UNITS tablet Take 2,000 Units by mouth daily.    . citalopram  (CELEXA) 10 MG tablet Take 20 mg by mouth daily.     . clotrimazole (LOTRIMIN) 1 % cream Apply 1 application topically 2 (two) times daily. 30 g 0  . colestipol (COLESTID) 5 G granules Take 5 g by mouth daily.    . fexofenadine (ALLEGRA) 180 MG tablet Take 180 mg by mouth daily as needed. For allergies    . fish oil-omega-3 fatty acids 1000 MG capsule Take 500 mg by mouth 2 (two) times daily.    . fluticasone (FLONASE) 50 MCG/ACT nasal spray Place 2 sprays into both nostrils daily. 16 g 6  . meloxicam (MOBIC) 15 MG tablet Take 1 tablet (15 mg total) by mouth daily. 90 tablet 3  . Multiple Vitamin (MULTIVITAMIN) tablet Take 1 tablet by mouth daily.     Marland Kitchen olmesartan (BENICAR) 20 MG tablet Take 1 tablet (20 mg total) by mouth daily. 90 tablet 6  . Olopatadine HCl (PATADAY) 0.2 % SOLN instill one drop into each eye once daily as needed for itch 2.5 mL 3  . pantoprazole (PROTONIX) 40 MG tablet Take 40 mg by mouth every morning.     . pioglitazone (ACTOS) 15 MG tablet TAKE ONE TABLET BY MOUTH EVERY MORNING 90 tablet 0  . Polyethyl Glycol-Propyl Glycol 0.4-0.3 % SOLN Apply 2 drops  to eye 2 (two) times daily. 30 mL 1  . rosuvastatin (CRESTOR) 10 MG tablet Take 1 tablet (10 mg total) by mouth daily. 30 tablet 0  . Testosterone (ANDROGEL) 20.25 MG/1.25GM (1.62%) GEL Place onto the skin daily. 2 pumps daily     No current facility-administered medications on file prior to visit.   Allergies  Allergen Reactions  . Doxazosin Mesylate     Reaction=unknown  . Penicillins Rash  . Sulfonamide Derivatives Rash   History   Social History  . Marital Status: Married    Spouse Name: N/A  . Number of Children: 1  . 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 Smoking status: Former Smoker    Quit date: 08/30/1983  . Smokeless tobacco: Former Systems developer  . Alcohol Use: Yes     Comment: rare  . Drug Use: No  . Sexual Activity: Not on file   Other Topics  Concern  . Not on file   Social History Narrative      Review of Systems  All other systems reviewed and are negative.      Objective:   Physical Exam  Cardiovascular: Normal rate, regular rhythm and normal heart sounds.   Pulmonary/Chest: Effort normal and breath sounds normal. No respiratory distress. He has no wheezes. He has no rales.  Genitourinary: Prostate is enlarged and tender.  Vitals reviewed.         Assessment & Plan:  Frequent urination - Plan: Urinalysis, Routine w reflex microscopic (not at [facility])  Acute prostatitis - Plan: tamsulosin (FLOMAX) 0.4 MG CAPS capsule, ciprofloxacin (CIPRO) 500 MG tablet, PSA  I believe the patient has developed acute prostatitis on top of BPH. I will treat him with Cipro 500 mg by mouth twice a day for 10 days and I will add Flomax 0.4 mg by mouth daily at bedtime. Recheck in 10-14 days and I will also recheck a PSA. PSA is rising substantially, I would like to get the patient into see a urologist. He states that his blood sugar has been around 150 so I doubt diabetes would be a cause of his polyuria.

## 2015-02-13 LAB — PSA: PSA: 3.59 ng/mL (ref <=4.00)

## 2015-02-24 ENCOUNTER — Telehealth: Payer: Self-pay | Admitting: Family Medicine

## 2015-02-24 NOTE — Telephone Encounter (Signed)
LMTRC

## 2015-02-24 NOTE — Telephone Encounter (Signed)
Spoke to pt and he feels he is having a rxn to the flomax - it started 2 nights ago and it got worse last night after taking the medication. 2 hours after he took a flomax he started itching all over. Informed pt that we could change it or he could stop it for 2-3 days and then try taking it again and that way we would be certain it was the medication. Pt would like to stop taking it and see what happens after he restarts it and will call us back to let us know if we need to change medication or not.

## 2015-02-24 NOTE — Telephone Encounter (Signed)
[phone] Pt states that Dr Dennard Schaumann put him on that Flomax and that the sulfate drug in it is making him itch really bad it has been going on for couple days no and he is wanting to speak to you about this.

## 2015-03-12 ENCOUNTER — Encounter: Payer: Self-pay | Admitting: Family Medicine

## 2015-03-12 ENCOUNTER — Ambulatory Visit (INDEPENDENT_AMBULATORY_CARE_PROVIDER_SITE_OTHER): Payer: Medicare HMO | Admitting: Family Medicine

## 2015-03-12 VITALS — BP 110/76 | HR 78 | Temp 98.2°F | Resp 18 | Ht 71.0 in | Wt 246.0 lb

## 2015-03-12 DIAGNOSIS — E1165 Type 2 diabetes mellitus with hyperglycemia: Secondary | ICD-10-CM

## 2015-03-12 DIAGNOSIS — N410 Acute prostatitis: Secondary | ICD-10-CM

## 2015-03-12 DIAGNOSIS — G629 Polyneuropathy, unspecified: Secondary | ICD-10-CM

## 2015-03-12 DIAGNOSIS — IMO0002 Reserved for concepts with insufficient information to code with codable children: Secondary | ICD-10-CM

## 2015-03-12 LAB — BASIC METABOLIC PANEL WITH GFR
BUN: 15 mg/dL (ref 6–23)
CALCIUM: 10.3 mg/dL (ref 8.4–10.5)
CHLORIDE: 100 meq/L (ref 96–112)
CO2: 25 meq/L (ref 19–32)
CREATININE: 0.94 mg/dL (ref 0.50–1.35)
GFR, EST NON AFRICAN AMERICAN: 83 mL/min
GFR, Est African American: >89 mL/min
Glucose, Bld: 157 mg/dL — ABNORMAL HIGH (ref 70–99)
POTASSIUM: 4.3 meq/L (ref 3.5–5.3)
SODIUM: 136 meq/L (ref 135–145)

## 2015-03-12 LAB — HEMOGLOBIN A1C
Hgb A1c MFr Bld: 7.4 % — ABNORMAL HIGH (ref <5.7)
MEAN PLASMA GLUCOSE: 166 mg/dL — AB (ref <117)

## 2015-03-12 NOTE — Progress Notes (Signed)
Subjective:    Patient ID: Cameron Daniel, male    DOB: 22-Feb-1946, 69 y.o.   MRN: [id number]  HPI 02/12/15 Patient presents with 2 weeks of worsening urinary retention, urinary frequency, urinary hesitancy, and some mild dysuria. PSA was recently checked in March and was found to be elevated at 3.16. Since that time the patient has discontinued his testosterone. Over the last 2 weeks his urinary symptoms have developed. He denies any fevers or chills. His urinalysis shows no evidence of urinary tract infection. On examination today, the patient has a +2 prostate which is tender to palpation but there is no palpable nodularity.  At that time, my plan was: I believe the patient has developed acute prostatitis on top of BPH. I will treat him with Cipro 500 mg by mouth twice a day for 10 days and I will add Flomax 0.4 mg by mouth daily at bedtime. Recheck in 10-14 days and I will also recheck a PSA. PSA is rising substantially, I would like to get the patient into see a urologist. He states that his blood sugar has been around 150 so I doubt diabetes would be a cause of his polyuria.  03/12/15 Patient is here today for recheck. Frequency of nocturia has improved. He is now only urinating once every night. He denies any dysuria, frequency, hesitancy. He completed the Cipro. He is here today to recheck his PSA. He denies any hematuria or urinary retention. PSA has steadily risen over the last few years. It was 3.16 in March. When I rechecked in June it was 3.69. He would like to recheck that today after completing metabolic. His hemoglobin A1c was also elevated in March. It was 7.1. At present he is trying to control the diabetes with Actos and through diet and exercise. He is due to recheck a hemoglobin A1c today. He also complains of severe peripheral neuropathy in his hands. Patient has undergone bilateral carpal tunnel surgery but continues to have painful burning paresthesias in both hands. Furthermore  the fingers on both hands are completely numb. Patient works as a Development worker, international aid. His work exacerbates his condition and causes severe pain when he tries to use his hands throughout the day. For instance yesterday he mowed using a push mower and had severe burning pain that kept him awake all night long. He also has numbness in his fingertips which prevents him from working on his equipment. This causes him to have a very poor quality of life as he is forced to continue working financially. His job exacerbates his peripheral neuropathy. 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   Past Medical History  Diagnosis Date  . Unspecified essential hypertension   . Other and unspecified hyperlipidemia   . Obesity, unspecified   . Esophageal reflux   . Nocturia   . Flushing   . Other malaise and fatigue   . Type II or unspecified type diabetes mellitus without mention of complication, not stated as uncontrolled   . Allergic rhinitis, cause unspecified   . Other acquired absence of organ   . Psychosexual dysfunction with inhibited sexual excitement   . Irritable bowel syndrome   . Diverticulosis of colon (without mention of hemorrhage)   . Personal history of colonic polyps   . Coronary atherosclerosis of unspecified type of vessel, native or graft     1985 , arthroplasty x 1   . Myocardial infarction   . Depression     PTSD   .  Hemorrhoids   . Esophageal stricture    Past Surgical History  Procedure Laterality Date  . Elbow bursa surgery    . Cholecystectomy    . Shoulder surgery      Rt. Shoulder  . Carpal tunnel release      left   . Coronary angioplasty      1985   . Colon surgery  2013   Current Outpatient Prescriptions on File Prior to Visit  Medication Sig Dispense Refill  . aspirin 81 MG tablet Take 81 mg by mouth every morning.     . cholecalciferol (VITAMIN D) 1000 UNITS tablet Take 2,000 Units by mouth daily.    . citalopram (CELEXA) 10 MG  tablet Take 20 mg by mouth daily.     . clotrimazole (LOTRIMIN) 1 % cream Apply 1 application topically 2 (two) times daily. 30 g 0  . colestipol (COLESTID) 5 G granules Take 5 g by mouth daily.    . fexofenadine (ALLEGRA) 180 MG tablet Take 180 mg by mouth daily as needed. For allergies    . fish oil-omega-3 fatty acids 1000 MG capsule Take 500 mg by mouth 2 (two) times daily.    . fluticasone (FLONASE) 50 MCG/ACT nasal spray Place 2 sprays into both nostrils daily. 16 g 6  . meloxicam (MOBIC) 15 MG tablet Take 1 tablet (15 mg total) by mouth daily. 90 tablet 3  . Multiple Vitamin (MULTIVITAMIN) tablet Take 1 tablet by mouth daily.     Marland Kitchen olmesartan (BENICAR) 20 MG tablet Take 1 tablet (20 mg total) by mouth daily. 90 tablet 6  . Olopatadine HCl (PATADAY) 0.2 % SOLN instill one drop into each eye once daily as needed for itch 2.5 mL 3  . pantoprazole (PROTONIX) 40 MG tablet Take 40 mg by mouth every morning.     . pioglitazone (ACTOS) 15 MG tablet TAKE ONE TABLET BY MOUTH EVERY MORNING 90 tablet 0  . Polyethyl Glycol-Propyl Glycol 0.4-0.3 % SOLN Apply 2 drops to eye 2 (two) times daily. 30 mL 1  . rosuvastatin (CRESTOR) 10 MG tablet Take 1 tablet (10 mg total) by mouth daily. 30 tablet 0  . tamsulosin (FLOMAX) 0.4 MG CAPS capsule Take 1 capsule (0.4 mg total) by mouth daily. 30 capsule 3  . ciprofloxacin (CIPRO) 500 MG tablet Take 1 tablet (500 mg total) by mouth 2 (two) times daily. (Patient not taking: Reported on 03/12/2015) 20 tablet 0   No current facility-administered medications on file prior to visit.   Allergies  Allergen Reactions  . Doxazosin Mesylate     Reaction=unknown  . Flomax [Tamsulosin Hcl]     Itching   . Penicillins Rash  . Sulfonamide Derivatives Rash   History   Social History  . Marital Status: Married    Spouse Name: N/A  . Number of Children: 1  . 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 Smoking status:  Former Smoker    Quit date: 08/30/1983  . Smokeless tobacco: Former Systems developer  . Alcohol Use: Yes     Comment: rare  . Drug Use: No  . Sexual Activity: Not on file   Other Topics Concern  . Not on file   Social History Narrative      Review of Systems  All other systems reviewed and are negative.      Objective:   Physical Exam  Cardiovascular: Normal rate, regular rhythm and normal  heart sounds.   Pulmonary/Chest: Effort normal and breath sounds normal. No respiratory distress. He has no wheezes. He has no rales.  Genitourinary: Prostate is enlarged. Prostate is not tender.  Vitals reviewed.         Assessment & Plan:  Acute prostatitis - Plan: PSA, Medicare  Diabetes mellitus type II, uncontrolled - Plan: BASIC METABOLIC PANEL WITH GFR, Hemoglobin A1c  Peripheral neuropathy  Recheck hemoglobin A1c. Goal hemoglobin A1c is less than 6.5. Consider trying the patient on Januvia if hemoglobin A1c is greater than 6.5. I will recheck a PSA. PSA continues to rise, I would recommend referral to a urologist. Prostate is examined today. It is smooth and symmetric and is nontender. It is still somewhat enlarged. Regarding his neuropathy he will continue to try to manage the pain with Tylenol and ibuprofen.  Unfortunately he cannot retired from working which exacerbates his neuropathy

## 2015-03-13 ENCOUNTER — Other Ambulatory Visit: Payer: Self-pay | Admitting: Family Medicine

## 2015-03-13 LAB — PSA, MEDICARE: PSA: 3.21 ng/mL (ref <=4.00)

## 2015-03-13 MED ORDER — SITAGLIPTIN PHOSPHATE 100 MG PO TABS: 100.0000 mg | ORAL_TABLET | Freq: Every day | ORAL | Status: DC

## 2015-03-13 MED ORDER — OLMESARTAN MEDOXOMIL 20 MG PO TABS: 20.0000 mg | ORAL_TABLET | Freq: Every day | ORAL | Status: DC

## 2015-03-16 ENCOUNTER — Telehealth: Payer: Self-pay | Admitting: Family Medicine

## 2015-03-16 NOTE — Telephone Encounter (Signed)
No generic of benicar but can take losartan 100 mg poqday instead.

## 2015-03-16 NOTE — Telephone Encounter (Signed)
[phone] PT has called and said that the benicar that was called in is to expensive and would like to have the generic of it omasartan called in instead

## 2015-03-16 NOTE — Telephone Encounter (Signed)
Can we change to something cheaper? We do send in generic.

## 2015-03-17 MED ORDER — LOSARTAN POTASSIUM 100 MG PO TABS: 100.0000 mg | ORAL_TABLET | Freq: Every day | ORAL | Status: DC

## 2015-03-17 NOTE — Telephone Encounter (Signed)
Med called to pharm and pt aware 

## 2015-03-24 ENCOUNTER — Ambulatory Visit (INDEPENDENT_AMBULATORY_CARE_PROVIDER_SITE_OTHER): Payer: Medicare HMO | Admitting: Family Medicine

## 2015-03-24 ENCOUNTER — Encounter: Payer: Self-pay | Admitting: Family Medicine

## 2015-03-24 VITALS — BP 122/68 | HR 76 | Temp 98.7°F | Resp 18 | Ht 71.0 in | Wt 246.0 lb

## 2015-03-24 DIAGNOSIS — S46811A Strain of other muscles, fascia and tendons at shoulder and upper arm level, right arm, initial encounter: Secondary | ICD-10-CM

## 2015-03-24 MED ORDER — CYCLOBENZAPRINE HCL 10 MG PO TABS: 10.0000 mg | ORAL_TABLET | Freq: Three times a day (TID) | ORAL | Status: DC | PRN

## 2015-03-24 NOTE — Progress Notes (Signed)
Subjective:    Patient ID: Cameron Daniel, male    DOB: 1946-04-14, 69 y.o.   MRN: [id number]  HPI Patient is a 69 year old white male who presents with several days of pain in the right side of his neck. The pain is located in the trapezius and radiates to to his clavicle and into the posterior aspect of his shoulder. He denies any numbness or tingling radiating down his arm. He denies any weakness in arm. He is very tender to palpation over the right side of the trapezius muscle. He has a palpable spasm. Patient states that this began while he was sleeping one night. He has decreased range of motion due to tightness and stiffness in his neck Past Medical History  Diagnosis Date  . Unspecified essential hypertension   . Other and unspecified hyperlipidemia   . Obesity, unspecified   . Esophageal reflux   . Nocturia   . Flushing   . Other malaise and fatigue   . Type II or unspecified type diabetes mellitus without mention of complication, not stated as uncontrolled   . Allergic rhinitis, cause unspecified   . Other acquired absence of organ   . Psychosexual dysfunction with inhibited sexual excitement   . Irritable bowel syndrome   . Diverticulosis of colon (without mention of hemorrhage)   . Personal history of colonic polyps   . Coronary atherosclerosis of unspecified type of vessel, native or graft     1985 , arthroplasty x 1   . Myocardial infarction   . Depression     PTSD   . Hemorrhoids   . Esophageal stricture    Past Surgical History  Procedure Laterality Date  . Elbow bursa surgery    . Cholecystectomy    . Shoulder surgery      Rt. Shoulder  . Carpal tunnel release      left   . Coronary angioplasty      1985   . Colon surgery  2013   Current Outpatient Prescriptions on File Prior to Visit  Medication Sig Dispense Refill  . aspirin 81 MG tablet Take 81 mg by mouth every morning.     . cholecalciferol (VITAMIN D) 1000 UNITS tablet Take 2,000 Units by  mouth daily.    . ciprofloxacin (CIPRO) 500 MG tablet Take 1 tablet (500 mg total) by mouth 2 (two) times daily. 20 tablet 0  . citalopram (CELEXA) 10 MG tablet Take 20 mg by mouth daily.     . clotrimazole (LOTRIMIN) 1 % cream Apply 1 application topically 2 (two) times daily. 30 g 0  . colestipol (COLESTID) 5 G granules Take 5 g by mouth daily.    . fexofenadine (ALLEGRA) 180 MG tablet Take 180 mg by mouth daily as needed. For allergies    . fish oil-omega-3 fatty acids 1000 MG capsule Take 500 mg by mouth 2 (two) times daily.    . fluticasone (FLONASE) 50 MCG/ACT nasal spray Place 2 sprays into both nostrils daily. 16 g 6  . losartan (COZAAR) 100 MG tablet Take 1 tablet (100 mg total) by mouth daily. 90 tablet 3  . meloxicam (MOBIC) 15 MG tablet Take 1 tablet (15 mg total) by mouth daily. 90 tablet 3  . Multiple Vitamin (MULTIVITAMIN) tablet Take 1 tablet by mouth daily.     . Olopatadine HCl (PATADAY) 0.2 % SOLN instill one drop into each eye once daily as needed for itch 2.5 mL 3  . pantoprazole (PROTONIX) 40 MG tablet  Take 40 mg by mouth every morning.     . pioglitazone (ACTOS) 15 MG tablet TAKE ONE TABLET BY MOUTH EVERY MORNING 90 tablet 0  . Polyethyl Glycol-Propyl Glycol 0.4-0.3 % SOLN Apply 2 drops to eye 2 (two) times daily. 30 mL 1  . rosuvastatin (CRESTOR) 10 MG tablet Take 1 tablet (10 mg total) by mouth daily. 30 tablet 0  . sitaGLIPtin (JANUVIA) 100 MG tablet Take 1 tablet (100 mg total) by mouth daily. 90 tablet 1  . tamsulosin (FLOMAX) 0.4 MG CAPS capsule Take 1 capsule (0.4 mg total) by mouth daily. 30 capsule 3   No current facility-administered medications on file prior to visit.   Allergies  Allergen Reactions  . Doxazosin Mesylate     Reaction=unknown  . Flomax [Tamsulosin Hcl]     Itching   . Penicillins Rash  . Sulfonamide Derivatives Rash   History   Social History  . Marital Status: Married    Spouse Name: N/A  . Number of Children: 1  . Years of  Education: N/A   Occupational History  . Lawns    Social History Main Topics  . Smoking status: Former Smoker    Quit date: 08/30/1983  . Smokeless tobacco: Former Systems developer  . Alcohol Use: Yes     Comment: rare  . Drug Use: No  . Sexual Activity: Not on file   Other Topics Concern  . Not on file   Social History Narrative      Review of Systems  All other systems reviewed and are negative.      Objective:   Physical Exam  Constitutional: He is oriented to person, place, and time.  Cardiovascular: Normal rate, regular rhythm and normal heart sounds.   Pulmonary/Chest: Effort normal and breath sounds normal.  Musculoskeletal:       Right shoulder: He exhibits deformity. He exhibits normal range of motion.       Cervical back: He exhibits decreased range of motion, tenderness, pain and spasm.  Neurological: He is alert and oriented to person, place, and time. He has normal reflexes. He displays normal reflexes. No cranial nerve deficit. He exhibits normal muscle tone. Coordination normal.  Vitals reviewed.         Assessment & Plan:  Trapezius strain, right, initial encounter - Plan: cyclobenzaprine (FLEXERIL) 10 MG tablet  Patient has had a previous rotator cuff surgery. There is a abnormal scar. However I do not believe this is due to his rotator cuff. Instead I think he strained his trapezius muscle while sleeping one night. Therefore I will treat the patient with heat and muscle relaxers. Use Flexeril 10 mg every 8 hours as needed for pain. Recheck in one week if no better or sooner if worse

## 2015-03-25 ENCOUNTER — Telehealth: Payer: Self-pay | Admitting: Family Medicine

## 2015-03-25 NOTE — Telephone Encounter (Signed)
PA submitted for Cyclobenzaprine through [facility] case [id number]

## 2015-03-26 NOTE — Telephone Encounter (Signed)
PA for Cyclobenzaprine has been approved 08/27/14 - 08/29/2015  Ref# [id number]  PA faxed to pharmacy

## 2015-03-30 ENCOUNTER — Encounter: Payer: Self-pay | Admitting: Family Medicine

## 2015-05-25 ENCOUNTER — Other Ambulatory Visit: Payer: Self-pay | Admitting: Family Medicine

## 2015-05-26 ENCOUNTER — Ambulatory Visit: Payer: Medicare HMO

## 2015-05-26 ENCOUNTER — Ambulatory Visit (INDEPENDENT_AMBULATORY_CARE_PROVIDER_SITE_OTHER): Payer: Medicare HMO | Admitting: Family Medicine

## 2015-05-26 ENCOUNTER — Telehealth: Payer: Self-pay | Admitting: Family Medicine

## 2015-05-26 ENCOUNTER — Encounter: Payer: Self-pay | Admitting: Family Medicine

## 2015-05-26 VITALS — BP 140/70 | HR 78 | Temp 98.4°F | Resp 18 | Ht 71.0 in | Wt 248.0 lb

## 2015-05-26 DIAGNOSIS — R42 Dizziness and giddiness: Secondary | ICD-10-CM

## 2015-05-26 NOTE — Progress Notes (Signed)
Subjective:    Patient ID: Cameron Daniel, male    DOB: 1946-07-06, 69 y.o.   MRN: [id number]  HPI  Patient was feeling fine this morning. 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 After bending over, the patient became very lightheaded. He felt extremely weak. He had to go sit down. He staggered to his chair and almost lost his balance. After drinking a Coke, he instantly felt better. He has been feeling fine ever since. Dix-Hallpike maneuver is negative bilaterally here. He denies any strokelike symptoms. He denies any double vision or vision loss. He denies any syncope or presyncope. He denies any palpitations or chest pain. He denies any vertigo but instead complains of dizziness and lightheadedness that was transient and lasted only a few seconds. He admits that he did not drink enough fluids this morning he only had coffee and had been sweating profusely with his job. He is also concerned his blood sugar may have dropped. He denies any seizure-like activity. He denies any unilateral neurologic deficit such as arm weakness or arm numbness or slurred speech or facial drooping Past Medical History  Diagnosis Date  . Unspecified essential hypertension   . Other and unspecified hyperlipidemia   . Obesity, unspecified   . Esophageal reflux   . Nocturia   . Flushing   . Other malaise and fatigue   . Type II or unspecified type diabetes mellitus without mention of complication, not stated as uncontrolled   . Allergic rhinitis, cause unspecified   . Other acquired absence of organ   . Psychosexual dysfunction with inhibited sexual excitement   . Irritable bowel syndrome   . Diverticulosis of colon (without mention of hemorrhage)   . Personal history of colonic polyps   . Coronary atherosclerosis of unspecified type of vessel, native or graft     1985 , arthroplasty x 1   . Myocardial infarction   . Depression     PTSD   . Hemorrhoids   . Esophageal stricture     Past Surgical History  Procedure Laterality Date  . Elbow bursa surgery    . Cholecystectomy    . Shoulder surgery      Rt. Shoulder  . Carpal tunnel release      left   . Coronary angioplasty      1985   . Colon surgery  2013   Current Outpatient Prescriptions on File Prior to Visit  Medication Sig Dispense Refill  . aspirin 81 MG tablet Take 81 mg by mouth every morning.     . cholecalciferol (VITAMIN D) 1000 UNITS tablet Take 2,000 Units by mouth daily.    . citalopram (CELEXA) 10 MG tablet Take 20 mg by mouth daily.     . clotrimazole (LOTRIMIN) 1 % cream Apply 1 application topically 2 (two) times daily. 30 g 0  . colestipol (COLESTID) 5 G granules Take 5 g by mouth daily.    . CRESTOR 10 MG tablet TAKE ONE TABLET BY MOUTH DAILY. 90 tablet 3  . cyclobenzaprine (FLEXERIL) 10 MG tablet Take 1 tablet (10 mg total) by mouth 3 (three) times daily as needed for muscle spasms. 30 tablet 0  . fexofenadine (ALLEGRA) 180 MG tablet Take 180 mg by mouth daily as needed. For allergies    . fish oil-omega-3 fatty acids 1000 MG capsule Take 500 mg by mouth 2 (two) times daily.    . fluticasone (FLONASE) 50 MCG/ACT nasal spray Place 2  sprays into both nostrils daily. 16 g 6  . losartan (COZAAR) 100 MG tablet Take 1 tablet (100 mg total) by mouth daily. 90 tablet 3  . meloxicam (MOBIC) 15 MG tablet Take 1 tablet (15 mg total) by mouth daily. 90 tablet 3  . Multiple Vitamin (MULTIVITAMIN) tablet Take 1 tablet by mouth daily.     . Olopatadine HCl (PATADAY) 0.2 % SOLN instill one drop into each eye once daily as needed for itch 2.5 mL 3  . pantoprazole (PROTONIX) 40 MG tablet Take 40 mg by mouth every morning.     . pioglitazone (ACTOS) 15 MG tablet TAKE ONE TABLET BY MOUTH EVERY MORNING 90 tablet 0  . Polyethyl Glycol-Propyl Glycol 0.4-0.3 % SOLN Apply 2 drops to eye 2 (two) times daily. 30 mL 1  . sitaGLIPtin (JANUVIA) 100 MG tablet Take 1 tablet (100 mg total) by mouth daily. 90 tablet 1  .  tamsulosin (FLOMAX) 0.4 MG CAPS capsule Take 1 capsule (0.4 mg total) by mouth daily. 30 capsule 3   No current facility-administered medications on file prior to visit.   Allergies  Allergen Reactions  . Doxazosin Mesylate     Reaction=unknown  . Flomax [Tamsulosin Hcl]     Itching   . Penicillins Rash  . Sulfonamide Derivatives Rash   Social History   Social History  . Marital Status: Married    Spouse Name: N/A  . Number of Children: 1  . 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 Smoking status: Former Smoker    Quit date: 08/30/1983  . Smokeless tobacco: Former Systems developer  . Alcohol Use: Yes     Comment: rare  . Drug Use: No  . Sexual Activity: Not on file   Other Topics Concern  . Not on file   Social History Narrative     Review of Systems  All other systems reviewed and are negative.      Objective:   Physical Exam  Constitutional: He is oriented to person, place, and time. He appears well-developed and well-nourished. No distress.  HENT:  Right Ear: External ear normal.  Left Ear: External ear normal.  Nose: Nose normal.  Mouth/Throat: Oropharynx is clear and moist. No oropharyngeal exudate.  Eyes: Conjunctivae and EOM are normal. Pupils are equal, round, and reactive to light.  Neck: Neck supple. No JVD present. No thyromegaly present.  Cardiovascular: Normal rate, regular rhythm and normal heart sounds.  Exam reveals no gallop and no friction rub.   No murmur heard. Pulmonary/Chest: Effort normal and breath sounds normal. No respiratory distress. He has no wheezes. He has no rales. He exhibits no tenderness.  Lymphadenopathy:    He has no cervical adenopathy.  Neurological: He is alert and oriented to person, place, and time. He has normal reflexes. He displays normal reflexes. No cranial nerve deficit. He exhibits normal muscle tone. Coordination normal.  Skin: Skin is warm. No rash noted. He is not  diaphoretic. No erythema. No pallor.  Vitals reviewed.         Assessment & Plan:  Dizziness  I suspect the patient's symptoms were due to a transient drop in his blood pressure secondary to heat exhaustion and dehydration versus hypoglycemic episode. Symptoms resolve with rest and with a Coca-Cola. The patient had no other symptoms that would suggest a TIA. He denies any chest pain or palpitations or signs of syncope. Therefore we will monitor the  patient clinically over the next few days. If symptoms do not return, no further workup is necessary at this time. He is to be rechecked immediately if symptoms return.

## 2015-05-26 NOTE — Telephone Encounter (Signed)
Pt called and states that he is urinating a lot during day and night and was wondering if the actos was causing this and if not anything he can do about it? His stream is normal and sometimes volume is a lot and sometimes not.

## 2015-05-26 NOTE — Telephone Encounter (Signed)
meds are not causing this.  Could be due to his prostate.  Lets see if his symptoms today return before any other meds are started.

## 2015-05-27 NOTE — Telephone Encounter (Signed)
Pt aware of below but wants to know if there is anything he can do for his prostate? He says it is doing the same thing as before and you put him on antibx's. He can not take Flomax d/t itching.

## 2015-05-28 ENCOUNTER — Encounter: Payer: Self-pay | Admitting: Family Medicine

## 2015-05-28 ENCOUNTER — Ambulatory Visit (INDEPENDENT_AMBULATORY_CARE_PROVIDER_SITE_OTHER): Payer: Medicare HMO | Admitting: Family Medicine

## 2015-05-28 VITALS — BP 100/60 | HR 78 | Temp 98.6°F | Resp 18 | Ht 71.0 in | Wt 249.0 lb

## 2015-05-28 DIAGNOSIS — R350 Frequency of micturition: Secondary | ICD-10-CM | POA: Diagnosis not present

## 2015-05-28 DIAGNOSIS — N400 Enlarged prostate without lower urinary tract symptoms: Secondary | ICD-10-CM | POA: Diagnosis not present

## 2015-05-28 LAB — URINALYSIS, ROUTINE W REFLEX MICROSCOPIC
Bilirubin Urine: NEGATIVE
GLUCOSE, UA: NEGATIVE
HGB URINE DIPSTICK: NEGATIVE
Ketones, ur: NEGATIVE
LEUKOCYTES UA: NEGATIVE
Nitrite: NEGATIVE
PH: 6 (ref 5.0–8.0)
Protein, ur: NEGATIVE
SPECIFIC GRAVITY, URINE: 1.015 (ref 1.001–1.035)

## 2015-05-28 MED ORDER — SILODOSIN 8 MG PO CAPS: 8.0000 mg | ORAL_CAPSULE | Freq: Every day | ORAL | Status: DC

## 2015-05-28 NOTE — Telephone Encounter (Signed)
Pt aware via vm and appt made

## 2015-05-28 NOTE — Progress Notes (Signed)
Subjective:    Patient ID: Cameron Daniel, male    DOB: 03/27/1946, 69 y.o.   MRN: [id number]  HPI  05/26/15 Patient was feeling fine this morning. 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 After bending over, the patient became very lightheaded. He felt extremely weak. He had to go sit down. He staggered to his chair and almost lost his balance. After drinking a Coke, he instantly felt better. He has been feeling fine ever since. Dix-Hallpike maneuver is negative bilaterally here. He denies any strokelike symptoms. He denies any double vision or vision loss. He denies any syncope or presyncope. He denies any palpitations or chest pain. He denies any vertigo but instead complains of dizziness and lightheadedness that was transient and lasted only a few seconds. He admits that he did not drink enough fluids this morning he only had coffee and had been sweating profusely with his job. He is also concerned his blood sugar may have dropped. He denies any seizure-like activity. He denies any unilateral neurologic deficit such as arm weakness or arm numbness or slurred speech or facial drooping.    At that time, my plan was: I suspect the patient's symptoms were due to a transient drop in his blood pressure secondary to heat exhaustion and dehydration versus hypoglycemic episode. Symptoms resolve with rest and with a Coca-Cola. The patient had no other symptoms that would suggest a TIA. He denies any chest pain or palpitations or signs of syncope. Therefore we will monitor the patient clinically over the next few days. If symptoms do not return, no further workup is necessary at this time. He is to be rechecked immediately if symptoms return.  05/28/15 Patient feels much better. However he forgot to mention at his last office visit that he has been experiencing polyuria. His blood sugars have been between 120 and 180. He checks it regularly and he denies any hyperglycemia. He  reports 3-4 episodes of nocturia per night. He denies any dysuria. He denies any hematuria. Urinalysis today is completely clear. He reports weak stream occasionally. He reports hesitancy. Past Medical History  Diagnosis Date  . Unspecified essential hypertension   . Other and unspecified hyperlipidemia   . Obesity, unspecified   . Esophageal reflux   . Nocturia   . Flushing   . Other malaise and fatigue   . Type II or unspecified type diabetes mellitus without mention of complication, not stated as uncontrolled   . Allergic rhinitis, cause unspecified   . Other acquired absence of organ   . Psychosexual dysfunction with inhibited sexual excitement   . Irritable bowel syndrome   . Diverticulosis of colon (without mention of hemorrhage)   . Personal history of colonic polyps   . Coronary atherosclerosis of unspecified type of vessel, native or graft     1985 , arthroplasty x 1   . Myocardial infarction   . Depression     PTSD   . Hemorrhoids   . Esophageal stricture    Past Surgical History  Procedure Laterality Date  . Elbow bursa surgery    . Cholecystectomy    . Shoulder surgery      Rt. Shoulder  . Carpal tunnel release      left   . Coronary angioplasty      1985   . Colon surgery  2013   Current Outpatient Prescriptions on File Prior to Visit  Medication Sig Dispense Refill  . aspirin 81 MG  tablet Take 81 mg by mouth every morning.     . cholecalciferol (VITAMIN D) 1000 UNITS tablet Take 2,000 Units by mouth daily.    . citalopram (CELEXA) 10 MG tablet Take 20 mg by mouth daily.     . clotrimazole (LOTRIMIN) 1 % cream Apply 1 application topically 2 (two) times daily. 30 g 0  . colestipol (COLESTID) 5 G granules Take 5 g by mouth daily.    . CRESTOR 10 MG tablet TAKE ONE TABLET BY MOUTH DAILY. 90 tablet 3  . cyclobenzaprine (FLEXERIL) 10 MG tablet Take 1 tablet (10 mg total) by mouth 3 (three) times daily as needed for muscle spasms. 30 tablet 0  . fexofenadine  (ALLEGRA) 180 MG tablet Take 180 mg by mouth daily as needed. For allergies    . fish oil-omega-3 fatty acids 1000 MG capsule Take 500 mg by mouth 2 (two) times daily.    . fluticasone (FLONASE) 50 MCG/ACT nasal spray Place 2 sprays into both nostrils daily. 16 g 6  . losartan (COZAAR) 100 MG tablet Take 1 tablet (100 mg total) by mouth daily. 90 tablet 3  . meloxicam (MOBIC) 15 MG tablet Take 1 tablet (15 mg total) by mouth daily. 90 tablet 3  . Multiple Vitamin (MULTIVITAMIN) tablet Take 1 tablet by mouth daily.     . Olopatadine HCl (PATADAY) 0.2 % SOLN instill one drop into each eye once daily as needed for itch 2.5 mL 3  . pantoprazole (PROTONIX) 40 MG tablet Take 40 mg by mouth every morning.     . pioglitazone (ACTOS) 15 MG tablet TAKE ONE TABLET BY MOUTH EVERY MORNING 90 tablet 0  . Polyethyl Glycol-Propyl Glycol 0.4-0.3 % SOLN Apply 2 drops to eye 2 (two) times daily. 30 mL 1  . sitaGLIPtin (JANUVIA) 100 MG tablet Take 1 tablet (100 mg total) by mouth daily. 90 tablet 1   No current facility-administered medications on file prior to visit.   Allergies  Allergen Reactions  . Doxazosin Mesylate     Reaction=unknown  . Flomax [Tamsulosin Hcl]     Itching   . Penicillins Rash  . Sulfonamide Derivatives Rash   Social History   Social History  . Marital Status: Married    Spouse Name: N/A  . Number of Children: 1  . 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 Smoking status: Former Smoker    Quit date: 08/30/1983  . Smokeless tobacco: Former Systems developer  . Alcohol Use: Yes     Comment: rare  . Drug Use: No  . Sexual Activity: Not on file   Other Topics Concern  . Not on file   Social History Narrative     Review of Systems  All other systems reviewed and are negative.      Objective:   Physical Exam  Constitutional: He appears well-developed and well-nourished. No distress.  HENT:  Mouth/Throat: No oropharyngeal  exudate.  Cardiovascular: Normal rate, regular rhythm and normal heart sounds.  Exam reveals no gallop and no friction rub.   No murmur heard. Pulmonary/Chest: Effort normal and breath sounds normal. No respiratory distress. He has no wheezes. He has no rales. He exhibits no tenderness.  Abdominal: Soft. Bowel sounds are normal. He exhibits no distension. There is no tenderness. There is no rebound and no guarding.  Genitourinary: Prostate is enlarged and tender.  Skin: Skin is warm. He is not diaphoretic.  Vitals  reviewed.         Assessment & Plan:  Frequent urination - Plan: Urinalysis, Routine w reflex microscopic (not at [facility])  BPH (benign prostatic hyperplasia) - Plan: silodosin (RAPAFLO) 8 MG CAPS capsule  PSA was recently checked in July was elevated greater than 3. Prostate is enlarged on exam. He cannot tolerate Fosamax due to previous history of itching. He cannot tolerate doxazosin due to hypotensive episodes. Therefore I believe his only choice in this family medication would be Rapaflo 8 mg by mouth daily at bedtime. He cannot tolerate that, I would switch the patient to finasteride. He may have to use Flomax temporarily into the finasteride takes effect and unfortunately, tolerate the itching.  He will accept this if necessary

## 2015-05-28 NOTE — Telephone Encounter (Signed)
Would check ua and psa and dre first.

## 2015-05-29 ENCOUNTER — Encounter: Payer: Self-pay | Admitting: Family Medicine

## 2015-06-02 ENCOUNTER — Telehealth: Payer: Self-pay | Admitting: Family Medicine

## 2015-06-02 MED ORDER — FINASTERIDE 5 MG PO TABS: 5.0000 mg | ORAL_TABLET | Freq: Every day | ORAL | Status: DC

## 2015-06-02 NOTE — Telephone Encounter (Signed)
Pt states that he is better but still going a lot at night and would like to know what to do about it?  Per [facility] - Try Finasteride 5 mg qd for 3 months then at that time we will stop the rapaflo but for now pt should be taking both medications.  Med sent to pharm and pt aware

## 2015-06-05 ENCOUNTER — Encounter: Payer: Self-pay | Admitting: Cardiology

## 2015-06-05 ENCOUNTER — Ambulatory Visit (INDEPENDENT_AMBULATORY_CARE_PROVIDER_SITE_OTHER): Payer: Medicare HMO | Admitting: Cardiology

## 2015-06-05 VITALS — BP 129/85 | HR 73 | Ht 71.0 in | Wt 246.0 lb

## 2015-06-05 DIAGNOSIS — I10 Essential (primary) hypertension: Secondary | ICD-10-CM | POA: Diagnosis not present

## 2015-06-05 DIAGNOSIS — I2510 Atherosclerotic heart disease of native coronary artery without angina pectoris: Secondary | ICD-10-CM | POA: Diagnosis not present

## 2015-06-05 DIAGNOSIS — E785 Hyperlipidemia, unspecified: Secondary | ICD-10-CM | POA: Diagnosis not present

## 2015-06-05 MED ORDER — FENOFIBRATE 160 MG PO TABS: 160.0000 mg | ORAL_TABLET | Freq: Every day | ORAL | Status: DC

## 2015-06-05 NOTE — Progress Notes (Signed)
Patient ID: Cameron Daniel, male   DOB: [birth year], 69 y.o.   MRN: [id number] Patient ID: Cameron Daniel, male   DOB: 1945-12-21, 69 y.o.   MRN: [id number]    [name] Date of Encounter: 06/05/2015  Primary Care Provider:  Odette Fraction, MD Primary Cardiologist:  [name], MD   Patient Profile Re-establish cardiology care  Problem List   Past Medical History  Diagnosis Date  . Unspecified essential hypertension   . Other and unspecified hyperlipidemia   . Obesity, unspecified   . Esophageal reflux   . Nocturia   . Flushing   . Other malaise and fatigue   . Type II or unspecified type diabetes mellitus without mention of complication, not stated as uncontrolled   . Allergic rhinitis, cause unspecified   . Other acquired absence of organ   . Psychosexual dysfunction with inhibited sexual excitement   . Irritable bowel syndrome   . Diverticulosis of colon (without mention of hemorrhage)   . Personal history of colonic polyps   . Coronary atherosclerosis of unspecified type of vessel, native or graft     1985 , arthroplasty x 1   . Myocardial infarction ([place])   . Depression     PTSD   . Hemorrhoids   . Esophageal stricture    Past Surgical History  Procedure Laterality Date  . Elbow bursa surgery    . Cholecystectomy    . Shoulder surgery      Rt. Shoulder  . Carpal tunnel release      left   . Coronary angioplasty      1985   . Colon surgery  2013    Allergies  Allergies  Allergen Reactions  . Doxazosin Mesylate     Reaction=unknown  . Doxazosin Mesylate Other (See Comments)  . Flomax [Tamsulosin Hcl]     Itching   . Other Other (See Comments)  . Penicillins Rash and Other (See Comments)  . Sulfonamide Derivatives Rash    HPI  69 year old male with h/o MI in 1985, HTN, hyperlipidemia, NIDDM, former smoking who is a former patient of [name]. He was lost for follow up since 2009 when he lost his insurance and  was followed at [place]. He denies any CP, SOB, dizziness, palpiations, syncope. He had a nuclear stress test 3 years ago that was unchanged from prior (per patient) and no intervention was done. 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   This is 1 year follow up, he didn't tolerate Lipitor but has no side effects with Crestor. He does landscaping and works daily with no chest pain and stable dyspnea on moderate exertion. He feels more tired now but attributes it to age and is planning to slow down and eventually retire.  He is complaint with his meds. No palpitations, orthopnea, PND, LE edema. One episode of dizziness when he was dehydrated.  Home Medications  Prior to Admission medications   Medication Sig Start Date End Date Taking? Authorizing Provider  aspirin 81 MG tablet Take 81 mg by mouth every morning.    Yes Historical Provider, MD  cholecalciferol (VITAMIN D) 1000 UNITS tablet Take 2,000 Units by mouth daily.   Yes Historical Provider, MD  citalopram (CELEXA) 10 MG tablet Take 20 mg by mouth daily.    Yes Historical Provider, MD  colestipol (COLESTID) 5 G granules Take 5 g by mouth daily.   Yes Historical Provider, MD  fexofenadine (  ALLEGRA) 180 MG tablet Take 180 mg by mouth daily as needed. For allergies   Yes Historical Provider, MD  fish oil-omega-3 fatty acids 1000 MG capsule Take 500 mg by mouth 2 (two) times daily.   Yes Historical Provider, MD  meloxicam (MOBIC) 15 MG tablet Take 1 tablet (15 mg total) by mouth daily. 04/25/13  Yes [name], MD  montelukast (SINGULAIR) 10 MG tablet Take 1 tablet (10 mg total) by mouth at bedtime. 12/20/12  Yes [name], MD  Multiple Vitamin (MULTIVITAMIN) tablet Take 1 tablet by mouth daily.    Yes Historical Provider, MD  olmesartan (BENICAR) 20 MG tablet Take 20 mg by mouth every morning.   Yes Historical Provider, MD  pantoprazole (PROTONIX) 40 MG tablet Take 40 mg by mouth every morning.    Yes  Historical Provider, MD  PATADAY 0.2 % SOLN INSTILL ONE DROP INTO EACH EYE DAILY AS NEEDED FOR ITCH. 01/06/13  Yes [name], MD  pioglitazone (ACTOS) 15 MG tablet Take 15 mg by mouth every morning.    Yes Historical Provider, MD  polyethylene glycol powder (GLYCOLAX/MIRALAX) powder Take 17 g by mouth daily. 03/12/13  Yes Jasper Riling. Pickering, MD  simvastatin (ZOCOR) 20 MG tablet Take 30 mg by mouth every evening.   Yes Historical Provider, MD  Testosterone (ANDROGEL) 20.25 MG/1.25GM (1.62%) GEL Place onto the skin daily. 2 pumps daily   Yes Historical Provider, MD  triamcinolone (NASACORT) 55 MCG/ACT nasal inhaler Place 2 sprays into the nose daily.   Yes Historical Provider, MD    Family History  Family History  Problem Relation Age of Onset  . Lung cancer Father   . Coronary artery disease Mother   . Colon cancer Neg Hx   . Heart attack Neg Hx   . Hypertension Neg Hx   . Stroke Neg Hx     Social History  Social History   Social History  . Marital Status: Married    Spouse Name: N/A  . Number of Children: 1  . 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 Smoking status: Former Smoker    Quit date: 08/30/1983  . Smokeless tobacco: Former Systems developer  . Alcohol Use: Yes     Comment: rare  . Drug Use: No  . Sexual Activity: Not on file   Other Topics Concern  . Not on file   Social History Narrative     Review of Systems General:  No chills, fever, night sweats or weight changes.  Cardiovascular:  No chest pain, dyspnea on exertion, edema, orthopnea, palpitations, paroxysmal nocturnal dyspnea. Dermatological: No rash, lesions/masses Respiratory: No cough, dyspnea Urologic: No hematuria, dysuria Abdominal:   No nausea, vomiting, diarrhea, bright red blood per rectum, melena, or hematemesis Neurologic:  No visual changes, wkns, changes in mental status. All other systems reviewed and are otherwise negative except as noted  above.  Physical Exam  Blood pressure 129/85, pulse 73, height 5\' 11"  (1.803 m), weight 246 lb (111.585 kg).  General: Pleasant, NAD Psych: Normal affect. Neuro: Alert and oriented X 3. Moves all extremities spontaneously. HEENT: Normal  Neck: Supple without bruits or JVD. Lungs:  Resp regular and unlabored, CTA. Heart: RRR no s3, s4, or murmurs. Abdomen: Soft, non-tender, non-distended, BS + x 4.  Extremities: No clubbing, cyanosis or edema. DP/PT/Radials 2+ and equal bilaterally.  Accessory Clinical Findings  ECG - SR, 70/min, LAD,PR 119ms, QRS 50ms,  QT 398 ms, prior inferior MI, no ST - T wave abnormalities, unchanged from 2014    Assessment & Plan  69 yearold male who was followed in our clinic for years and is coming back for re-establishing cardiology care  1. CAD - currently asymptomatic, we will continue with Aspirin, BB, ARB, statin  We will consider stress test if any change in status  2. Hypertension - controlled  3. Hyperlipidemia - very elevated TAG., very good LDL 14- and HDL 65. We started Crestor, added fish oil. TG from [place] 318, HDL 57, LDL 76. I will add fenofibrate 160 mg po daily.  4. NIDDM - elevated HbA1c - the patient will follow with PCP  Follow up in 1 year.   Dorothy Spark, MD 06/05/2015, 9:31 AM

## 2015-06-05 NOTE — Patient Instructions (Addendum)
Medication Instructions:  START FENOFIBRATE  160 MG  EVERY DAY  Labwork: NONE  Testing/Procedures: NONE  Follow-Up: Your physician wants you to follow-up in:  You will receive a reminder letter in the mail two months in advance. If you don't receive a letter, please call our office to schedule the follow-up appointment.   Any Other Special Instructions Will Be Listed Below (If Applicable).

## 2015-06-11 ENCOUNTER — Encounter: Payer: Self-pay | Admitting: Family Medicine

## 2015-06-23 ENCOUNTER — Encounter: Payer: Self-pay | Admitting: Family Medicine

## 2015-06-23 ENCOUNTER — Ambulatory Visit (INDEPENDENT_AMBULATORY_CARE_PROVIDER_SITE_OTHER): Payer: Medicare HMO | Admitting: Family Medicine

## 2015-06-23 VITALS — BP 112/68 | HR 88 | Temp 98.8°F | Resp 18 | Ht 71.0 in | Wt 253.0 lb

## 2015-06-23 DIAGNOSIS — J302 Other seasonal allergic rhinitis: Secondary | ICD-10-CM

## 2015-06-23 MED ORDER — METHYLPREDNISOLONE ACETATE 80 MG/ML IJ SUSP
60.0000 mg | Freq: Once | INTRAMUSCULAR | Status: AC
  Administered 2015-06-23: 60 mg via INTRAMUSCULAR

## 2015-06-23 NOTE — Progress Notes (Signed)
Subjective:    Patient ID: Cameron Daniel, male    DOB: [birth year], 69 y.o.   MRN: [id number]  HPI Patient reports 2 days of sinus congestion, postnasal drip, clear rhinorrhea, scratchy itchy throat, and cough productive of clear mucus. He has a history of chronic allergic rhinitis on maximum medical therapy. Including Allegra, flonase, and pataday.   Past Medical History  Diagnosis Date  . Unspecified essential hypertension   . Other and unspecified hyperlipidemia   . Obesity, unspecified   . Esophageal reflux   . Nocturia   . Flushing   . Other malaise and fatigue   . Type II or unspecified type diabetes mellitus without mention of complication, not stated as uncontrolled   . Allergic rhinitis, cause unspecified   . Other acquired absence of organ   . Psychosexual dysfunction with inhibited sexual excitement   . Irritable bowel syndrome   . Diverticulosis of colon (without mention of hemorrhage)   . Personal history of colonic polyps   . Coronary atherosclerosis of unspecified type of vessel, native or graft     1985 , arthroplasty x 1   . Myocardial infarction ([place])   . Depression     PTSD   . Hemorrhoids   . Esophageal stricture    Past Surgical History  Procedure Laterality Date  . Elbow bursa surgery    . Cholecystectomy    . Shoulder surgery      Rt. Shoulder  . Carpal tunnel release      left   . Coronary angioplasty      1985   . Colon surgery  2013   Current Outpatient Prescriptions on File Prior to Visit  Medication Sig Dispense Refill  . aspirin 81 MG tablet Take 81 mg by mouth every morning.     . cholecalciferol (VITAMIN D) 1000 UNITS tablet Take 2,000 Units by mouth daily.    . citalopram (CELEXA) 10 MG tablet Take 20 mg by mouth daily.     . colestipol (COLESTID) 5 G granules Take 5 g by mouth daily.    . CRESTOR 10 MG tablet TAKE ONE TABLET BY MOUTH DAILY. 90 tablet 3  . fenofibrate 160 MG tablet Take 1 tablet (160 mg total) by mouth daily. 30  tablet 11  . fexofenadine (ALLEGRA) 180 MG tablet Take 180 mg by mouth daily as needed. For allergies    . finasteride (PROSCAR) 5 MG tablet Take 1 tablet (5 mg total) by mouth daily. 30 tablet 5  . fish oil-omega-3 fatty acids 1000 MG capsule Take 500 mg by mouth 2 (two) times daily.    . fluticasone (FLONASE) 50 MCG/ACT nasal spray Place 2 sprays into both nostrils daily. 16 g 6  . losartan (COZAAR) 100 MG tablet Take 1 tablet (100 mg total) by mouth daily. 90 tablet 3  . meloxicam (MOBIC) 15 MG tablet Take 1 tablet (15 mg total) by mouth daily. 90 tablet 3  . Multiple Vitamin (MULTIVITAMIN) tablet Take 1 tablet by mouth daily.     Marland Kitchen olmesartan (BENICAR) 20 MG tablet Take 20 mg by mouth daily.    . Olopatadine HCl (PATADAY) 0.2 % SOLN instill one drop into each eye once daily as needed for itch 2.5 mL 3  . pantoprazole (PROTONIX) 40 MG tablet Take 40 mg by mouth every morning.     . pioglitazone (ACTOS) 15 MG tablet TAKE ONE TABLET BY MOUTH EVERY MORNING 90 tablet 0  . Polyethyl Glycol-Propyl Glycol 0.4-0.3 %  SOLN Apply 2 drops to eye 2 (two) times daily. 30 mL 1  . silodosin (RAPAFLO) 8 MG CAPS capsule Take 1 capsule (8 mg total) by mouth daily with breakfast. 30 capsule 5  . sitaGLIPtin (JANUVIA) 100 MG tablet Take 1 tablet (100 mg total) by mouth daily. (Patient not taking: Reported on 06/23/2015) 90 tablet 1   No current facility-administered medications on file prior to visit.   Allergies  Allergen Reactions  . Doxazosin Mesylate     Reaction=unknown  . Doxazosin Mesylate Other (See Comments)  . Flomax [Tamsulosin Hcl]     Itching   . Other Other (See Comments)  . Penicillins Rash and Other (See Comments)  . Sulfonamide Derivatives Rash   Social History   Social History  . Marital Status: Married    Spouse Name: N/A  . Number of Children: 1  . 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 Smoking status: Former Smoker     Quit date: 08/30/1983  . Smokeless tobacco: Former Systems developer  . Alcohol Use: Yes     Comment: rare  . Drug Use: No  . Sexual Activity: Not on file   Other Topics Concern  . Not on file   Social History Narrative     Review of Systems  All other systems reviewed and are negative.      Objective:   Physical Exam  Constitutional: He appears well-developed and well-nourished. No distress.  HENT:  Right Ear: External ear normal.  Left Ear: External ear normal.  Nose: Mucosal edema and rhinorrhea present. Right sinus exhibits no maxillary sinus tenderness and no frontal sinus tenderness. Left sinus exhibits no maxillary sinus tenderness and no frontal sinus tenderness.  Mouth/Throat: Oropharynx is clear and moist. No oropharyngeal exudate.  Cardiovascular: Normal rate, regular rhythm and normal heart sounds.   Pulmonary/Chest: Effort normal and breath sounds normal.  Lymphadenopathy:    He has no cervical adenopathy.  Skin: He is not diaphoretic.  Vitals reviewed.         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 I recommended tincture of time. However the patient request Depo-Medrol 60 mg as this helped calm his sinuses down. Therefore I gave the patient Depo-Medrol 60 mg IM 1

## 2015-06-23 NOTE — Addendum Note (Signed)
Addended by: Shary Decamp B on: 06/23/2015 03:06 PM   Modules accepted: Orders

## 2015-07-01 DIAGNOSIS — E119 Type 2 diabetes mellitus without complications: Secondary | ICD-10-CM | POA: Diagnosis not present

## 2015-07-16 DIAGNOSIS — M654 Radial styloid tenosynovitis [de Quervain]: Secondary | ICD-10-CM | POA: Diagnosis not present

## 2015-07-20 ENCOUNTER — Telehealth: Payer: Self-pay | Admitting: Family Medicine

## 2015-07-20 DIAGNOSIS — N400 Enlarged prostate without lower urinary tract symptoms: Secondary | ICD-10-CM

## 2015-07-20 DIAGNOSIS — R350 Frequency of micturition: Secondary | ICD-10-CM

## 2015-07-20 NOTE — Telephone Encounter (Signed)
Spoke to wife and he is going a lot and is still having the urgency to go. No blood and no odor to urine and would like to know if there is something else he could try to take before going to urology?

## 2015-07-20 NOTE — Telephone Encounter (Signed)
Patient is calling to talk to you about the urination medication that was prescribed  Cameron Daniel it is not working please call him at [phone]

## 2015-07-21 MED ORDER — SOLIFENACIN SUCCINATE 10 MG PO TABS: 10.0000 mg | ORAL_TABLET | Freq: Every day | ORAL | Status: DC

## 2015-07-21 NOTE — Telephone Encounter (Signed)
LMOVM about the instructions below and informed pt to call back with any questions - referral to urology placed

## 2015-07-21 NOTE — Telephone Encounter (Signed)
If he is on rapaflo AND finasteride, I would like to consult urology.  Meanwhile, lets try samples of vesicare 10 poqdya just in case it is oab and not his bph.  If it gets better on vesicare, I would THEN stop rapaflo.  I'd stay on finasteride because he certainly has bph.

## 2015-08-05 ENCOUNTER — Encounter: Payer: Self-pay | Admitting: Internal Medicine

## 2015-08-10 ENCOUNTER — Other Ambulatory Visit: Payer: Self-pay | Admitting: Family Medicine

## 2015-08-10 MED ORDER — PANTOPRAZOLE SODIUM 40 MG PO TBEC: 40.0000 mg | DELAYED_RELEASE_TABLET | Freq: Every morning | ORAL | Status: DC

## 2015-09-21 DIAGNOSIS — R351 Nocturia: Secondary | ICD-10-CM | POA: Diagnosis not present

## 2015-09-21 DIAGNOSIS — N401 Enlarged prostate with lower urinary tract symptoms: Secondary | ICD-10-CM | POA: Diagnosis not present

## 2015-09-21 DIAGNOSIS — R3915 Urgency of urination: Secondary | ICD-10-CM | POA: Diagnosis not present

## 2015-09-21 DIAGNOSIS — Z0000 Encounter for general adult medical examination without abnormal findings: Secondary | ICD-10-CM | POA: Diagnosis not present

## 2015-09-23 ENCOUNTER — Telehealth: Payer: Self-pay | Admitting: Family Medicine

## 2015-09-23 NOTE — Telephone Encounter (Signed)
Pt states that he just is not feeling well and he would like to be seen - appt made for same day

## 2015-09-23 NOTE — Telephone Encounter (Signed)
Patient would like you to call him. He called in requesting an appt for tomorrow afternoon. I advised patient we didn't have anything available with Dr. Dennard Schaumann I did offer MB.   Patient then asked if he could talk with Lovey Newcomer to let her know what is going on. He stated to me he didn't feel good and he has had problems with his prostate.

## 2015-09-24 ENCOUNTER — Encounter: Payer: Self-pay | Admitting: Family Medicine

## 2015-09-24 ENCOUNTER — Ambulatory Visit (INDEPENDENT_AMBULATORY_CARE_PROVIDER_SITE_OTHER): Payer: Medicare HMO | Admitting: Family Medicine

## 2015-09-24 VITALS — BP 110/64 | HR 78 | Temp 98.4°F | Resp 16 | Ht 71.0 in | Wt 253.0 lb

## 2015-09-24 DIAGNOSIS — N400 Enlarged prostate without lower urinary tract symptoms: Secondary | ICD-10-CM

## 2015-09-24 DIAGNOSIS — W19XXXA Unspecified fall, initial encounter: Secondary | ICD-10-CM | POA: Diagnosis not present

## 2015-09-24 DIAGNOSIS — R350 Frequency of micturition: Secondary | ICD-10-CM

## 2015-09-24 NOTE — Progress Notes (Signed)
Subjective:    Patient ID: THEORY IMIG, male    DOB: May 18, 1946, 70 y.o.   MRN: [id number]  HPI  2 weeks ago, the patient fell walking on ice. He landed on his right shoulder and also twisted his back while falling. He complains of some mild pain in the posterior lateral aspect of his right shoulder. He has full abduction to 150 without pain. 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 He has a negative empty can sign. A negative Hawkins sign. A negative Yergason and speed's test. He has some mild crepitus in the right shoulder. There is no palpable deformity. It is not tender to palpation. I believe he may of just strained his arm or bruised his shoulder in the fall. He also complains of some lower back pain since the fall. He denies any radiation into his legs. He denies any numbness or tingling in his legs. His reflexes are normal at his knee and his Achilles tendon. His muscle strength is normal. He has negative straight leg raise. He is slightly tender to palpation in the lumbar paraspinal muscles. He also saw urology for his BPH. They recommended finasteride and Flomax however when he took Flomax he experienced orthostatic near syncope and therefore he stopped the medication on his iron. His biggest concern now is polyuria. He states the sudden urges to urinate. Frequently he does not make it to the bathroom in time he will experience urge incontinence. The symptoms are more consistent with overactive bladder syndrome even though the patient does have BPH. Past Medical History  Diagnosis Date  . Unspecified essential hypertension   . Other and unspecified hyperlipidemia   . Obesity, unspecified   . Esophageal reflux   . Nocturia   . Flushing   . Other malaise and fatigue   . Type II or unspecified type diabetes mellitus without mention of complication, not stated as uncontrolled   .  Allergic rhinitis, cause unspecified   . Other acquired absence of organ   . Psychosexual dysfunction with inhibited sexual excitement   . Irritable bowel syndrome   . Diverticulosis of colon (without mention of hemorrhage)   . Personal history of colonic polyps   . Coronary atherosclerosis of unspecified type of vessel, native or graft     1985 , arthroplasty x 1   . Myocardial infarction ([place])   . Depression     PTSD   . Hemorrhoids   . Esophageal stricture    Past Surgical History  Procedure Laterality Date  . Elbow bursa surgery    . Cholecystectomy    . Shoulder surgery      Rt. Shoulder  . Carpal tunnel release      left   . Coronary angioplasty      1985   . Colon surgery  2013   Current Outpatient Prescriptions on File Prior to Visit  Medication Sig Dispense Refill  . aspirin 81 MG tablet Take 81 mg by mouth every morning.     . cholecalciferol (VITAMIN D) 1000 UNITS tablet Take 2,000 Units by mouth daily.    . citalopram (CELEXA) 10 MG tablet Take 20 mg by mouth daily.     . colestipol (COLESTID) 5 G granules Take 5 g by mouth daily.    . CRESTOR 10 MG tablet TAKE ONE TABLET BY MOUTH DAILY. 90 tablet 3  .  fenofibrate 160 MG tablet Take 1 tablet (160 mg total) by mouth daily. 30 tablet 11  . fexofenadine (ALLEGRA) 180 MG tablet Take 180 mg by mouth daily as needed. For allergies    . fish oil-omega-3 fatty acids 1000 MG capsule Take 500 mg by mouth 2 (two) times daily.    . fluticasone (FLONASE) 50 MCG/ACT nasal spray Place 2 sprays into both nostrils daily. 16 g 6  . losartan (COZAAR) 100 MG tablet Take 1 tablet (100 mg total) by mouth daily. 90 tablet 3  . meloxicam (MOBIC) 15 MG tablet Take 1 tablet (15 mg total) by mouth daily. 90 tablet 3  . Multiple Vitamin (MULTIVITAMIN) tablet Take 1 tablet by mouth daily.     Marland Kitchen olmesartan (BENICAR) 20 MG tablet Take 20 mg by mouth daily.    . Olopatadine HCl (PATADAY) 0.2 % SOLN instill one drop into each eye once daily as  needed for itch 2.5 mL 3  . pantoprazole (PROTONIX) 40 MG tablet Take 1 tablet (40 mg total) by mouth every morning. 30 tablet 11  . pioglitazone (ACTOS) 15 MG tablet TAKE ONE TABLET BY MOUTH EVERY MORNING 90 tablet 0  . Polyethyl Glycol-Propyl Glycol 0.4-0.3 % SOLN Apply 2 drops to eye 2 (two) times daily. 30 mL 1  . sitaGLIPtin (JANUVIA) 100 MG tablet Take 1 tablet (100 mg total) by mouth daily. 90 tablet 1  . finasteride (PROSCAR) 5 MG tablet Take 1 tablet (5 mg total) by mouth daily. (Patient not taking: Reported on 09/24/2015) 30 tablet 5   No current facility-administered medications on file prior to visit.   Allergies  Allergen Reactions  . Doxazosin Mesylate     Reaction=unknown  . Doxazosin Mesylate Other (See Comments)  . Flomax [Tamsulosin Hcl]     Itching   . Other Other (See Comments)  . Penicillins Rash and Other (See Comments)  . Sulfonamide Derivatives Rash   Social History   Social History  . Marital Status: Married    Spouse Name: N/A  . Number of Children: 1  . 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 Smoking status: Former Smoker    Quit date: 08/30/1983  . Smokeless tobacco: Former Systems developer  . Alcohol Use: Yes     Comment: rare  . Drug Use: No  . Sexual Activity: Not on file   Other Topics Concern  . Not on file   Social History Narrative      Review of Systems  All other systems reviewed and are negative.      Objective:   Physical Exam  Constitutional: He appears well-developed and well-nourished.  Cardiovascular: Normal rate, regular rhythm and normal heart sounds.   Pulmonary/Chest: Effort normal and breath sounds normal.  Musculoskeletal: He exhibits tenderness.       Right shoulder: He exhibits decreased range of motion and crepitus. He exhibits no tenderness, no bony tenderness, no deformity, no pain, no spasm and normal strength.       Lumbar back: He exhibits decreased range of  motion, tenderness and spasm. He exhibits no bony tenderness.  Vitals reviewed.         Assessment & Plan:  Fall, initial encounter  Frequent urination  BPH (benign prostatic hyperplasia)   I believe the patient suffered a contusion and a muscle strain in his shoulder and in his lower back when he fell. I recommended tincture of time. He can certainly  use a back brace to help with the pain in his back with walking. I anticipate both symptoms should improve gradually over the next 2 weeks. His urinary symptoms sound more like overactive bladder today. I have asked the patient  To continue finasteride. However I would suggest trying samples of Vesicare 10 mg by mouth daily to see if it helps with his symptoms. He will try call me back later to see if I have some samples because unfortunately today I'm out of stock.

## 2015-10-08 DIAGNOSIS — M654 Radial styloid tenosynovitis [de Quervain]: Secondary | ICD-10-CM | POA: Diagnosis not present

## 2015-10-21 ENCOUNTER — Other Ambulatory Visit: Payer: Self-pay | Admitting: Orthopaedic Surgery

## 2015-10-21 ENCOUNTER — Other Ambulatory Visit (HOSPITAL_COMMUNITY): Payer: Self-pay | Admitting: Orthopaedic Surgery

## 2015-10-21 DIAGNOSIS — M25531 Pain in right wrist: Secondary | ICD-10-CM

## 2015-10-23 ENCOUNTER — Other Ambulatory Visit: Payer: Self-pay | Admitting: Family Medicine

## 2015-10-23 DIAGNOSIS — E785 Hyperlipidemia, unspecified: Secondary | ICD-10-CM

## 2015-10-23 DIAGNOSIS — Z79899 Other long term (current) drug therapy: Secondary | ICD-10-CM

## 2015-10-23 DIAGNOSIS — E119 Type 2 diabetes mellitus without complications: Secondary | ICD-10-CM

## 2015-10-23 DIAGNOSIS — I10 Essential (primary) hypertension: Secondary | ICD-10-CM

## 2015-10-23 MED ORDER — ROSUVASTATIN CALCIUM 10 MG PO TABS: 10.0000 mg | ORAL_TABLET | Freq: Every day | ORAL | Status: DC

## 2015-10-26 DIAGNOSIS — R3915 Urgency of urination: Secondary | ICD-10-CM | POA: Diagnosis not present

## 2015-10-26 DIAGNOSIS — R351 Nocturia: Secondary | ICD-10-CM | POA: Diagnosis not present

## 2015-10-26 DIAGNOSIS — Z0000 Encounter for general adult medical examination without abnormal findings: Secondary | ICD-10-CM | POA: Diagnosis not present

## 2015-10-26 DIAGNOSIS — N5201 Erectile dysfunction due to arterial insufficiency: Secondary | ICD-10-CM | POA: Diagnosis not present

## 2015-10-26 DIAGNOSIS — N401 Enlarged prostate with lower urinary tract symptoms: Secondary | ICD-10-CM | POA: Diagnosis not present

## 2015-10-27 ENCOUNTER — Other Ambulatory Visit: Payer: Medicare HMO

## 2015-10-27 DIAGNOSIS — E119 Type 2 diabetes mellitus without complications: Secondary | ICD-10-CM

## 2015-10-27 DIAGNOSIS — Z79899 Other long term (current) drug therapy: Secondary | ICD-10-CM

## 2015-10-27 DIAGNOSIS — I10 Essential (primary) hypertension: Secondary | ICD-10-CM | POA: Diagnosis not present

## 2015-10-27 DIAGNOSIS — E785 Hyperlipidemia, unspecified: Secondary | ICD-10-CM | POA: Diagnosis not present

## 2015-10-27 LAB — CBC WITH DIFFERENTIAL/PLATELET
Basophils Absolute: 0.1 10*3/uL (ref 0.0–0.1)
Basophils Relative: 1 % (ref 0–1)
Eosinophils Absolute: 0.1 10*3/uL (ref 0.0–0.7)
Eosinophils Relative: 2 % (ref 0–5)
HEMATOCRIT: 47.7 % (ref 39.0–52.0)
HEMOGLOBIN: 15.6 g/dL (ref 13.0–17.0)
LYMPHS PCT: 34 % (ref 12–46)
Lymphs Abs: 2.2 10*3/uL (ref 0.7–4.0)
MCH: 29.1 pg (ref 26.0–34.0)
MCHC: 32.7 g/dL (ref 30.0–36.0)
MCV: 88.8 fL (ref 78.0–100.0)
MONO ABS: 0.5 10*3/uL (ref 0.1–1.0)
MONOS PCT: 8 % (ref 3–12)
MPV: 9.8 fL (ref 8.6–12.4)
NEUTROS ABS: 3.6 10*3/uL (ref 1.7–7.7)
Neutrophils Relative %: 55 % (ref 43–77)
Platelets: 178 10*3/uL (ref 150–400)
RBC: 5.37 MIL/uL (ref 4.22–5.81)
RDW: 14.6 % (ref 11.5–15.5)
WBC: 6.5 10*3/uL (ref 4.0–10.5)

## 2015-10-27 LAB — LIPID PANEL
CHOL/HDL RATIO: 3.7 ratio (ref <=5.0)
CHOLESTEROL: 195 mg/dL (ref 125–200)
HDL: 53 mg/dL (ref >=40)
LDL CALC: 63 mg/dL (ref <130)
Triglycerides: 397 mg/dL — ABNORMAL HIGH (ref <150)
VLDL: 79 mg/dL — ABNORMAL HIGH (ref <30)

## 2015-10-27 LAB — COMPREHENSIVE METABOLIC PANEL
ALBUMIN: 4.4 g/dL (ref 3.6–5.1)
ALT: 27 U/L (ref 9–46)
AST: 28 U/L (ref 10–35)
Alkaline Phosphatase: 69 U/L (ref 40–115)
BILIRUBIN TOTAL: 0.6 mg/dL (ref 0.2–1.2)
BUN: 13 mg/dL (ref 7–25)
CALCIUM: 10.6 mg/dL — AB (ref 8.6–10.3)
CHLORIDE: 99 mmol/L (ref 98–110)
CO2: 25 mmol/L (ref 20–31)
Creat: 0.94 mg/dL (ref 0.70–1.25)
Glucose, Bld: 168 mg/dL — ABNORMAL HIGH (ref 70–99)
POTASSIUM: 4.6 mmol/L (ref 3.5–5.3)
Sodium: 138 mmol/L (ref 135–146)
Total Protein: 6.7 g/dL (ref 6.1–8.1)

## 2015-10-27 LAB — HEMOGLOBIN A1C
Hgb A1c MFr Bld: 7.3 % — ABNORMAL HIGH (ref <5.7)
MEAN PLASMA GLUCOSE: 163 mg/dL — AB (ref <117)

## 2015-10-30 ENCOUNTER — Telehealth: Payer: Self-pay | Admitting: Family Medicine

## 2015-10-30 NOTE — Telephone Encounter (Signed)
Patient asking for blood work results  [phone]

## 2015-11-02 ENCOUNTER — Ambulatory Visit (HOSPITAL_COMMUNITY)
Admission: RE | Admit: 2015-11-02 | Discharge: 2015-11-02 | Disposition: A | Discharge status: Home / Self Care | Payer: Medicare HMO | Source: Ambulatory Visit | Attending: Orthopaedic Surgery | Admitting: Orthopaedic Surgery

## 2015-11-02 DIAGNOSIS — M67431 Ganglion, right wrist: Secondary | ICD-10-CM | POA: Diagnosis not present

## 2015-11-02 DIAGNOSIS — S63591A Other specified sprain of right wrist, initial encounter: Secondary | ICD-10-CM | POA: Insufficient documentation

## 2015-11-02 DIAGNOSIS — M25531 Pain in right wrist: Secondary | ICD-10-CM | POA: Diagnosis not present

## 2015-11-02 DIAGNOSIS — M19031 Primary osteoarthritis, right wrist: Secondary | ICD-10-CM | POA: Diagnosis not present

## 2015-11-02 DIAGNOSIS — M659 Synovitis and tenosynovitis, unspecified: Secondary | ICD-10-CM | POA: Insufficient documentation

## 2015-11-02 NOTE — Telephone Encounter (Signed)
Patient aware of results via vm  

## 2015-11-10 ENCOUNTER — Telehealth: Payer: Self-pay | Admitting: Family Medicine

## 2015-11-10 NOTE — Telephone Encounter (Signed)
PATIENT HAS A QUESTION ABOUT TAKING THE ACTOS WITH HIS JARDIANCE? DOES HE STILL TAKE THE ACTOS? [phone]

## 2015-11-11 NOTE — Telephone Encounter (Signed)
1. I do not see jardience on med list - Januvia is on med list - called pt's wife she states that pt is not on Januvia but has samples of Jardience 25 mg and Invokana 300 mg (I took Januvia off med list). 2. Is it OK for him to take Actos with either one of those medications?

## 2015-11-12 NOTE — Telephone Encounter (Signed)
I am thoroughly confused.  When he talked to me in the hallway, he stated he was only on actos.  Please verify that was all he was on until recently.  After that I wanted him to add jardiance 25 mg poqday to his actos because his recent a1c was elevated.  Those were supposed to be the only two meds he was on.  I am not sure when invokana was added or even if he were taking it.

## 2015-11-13 NOTE — Telephone Encounter (Signed)
[place] on cell [phone]

## 2015-11-13 NOTE — Telephone Encounter (Signed)
Pt called back.  He is only taking the Actos and is starting the Jardiance samples you gave him.  They are his only diabetic meds.  Jardiance added to med list.

## 2015-11-16 DIAGNOSIS — M25571 Pain in right ankle and joints of right foot: Secondary | ICD-10-CM | POA: Diagnosis not present

## 2015-11-16 NOTE — Telephone Encounter (Signed)
Pt aware via vm 

## 2015-11-16 NOTE — Telephone Encounter (Signed)
Recheck hga1c in 3 months ON jardiance

## 2015-11-26 ENCOUNTER — Other Ambulatory Visit: Payer: Self-pay | Admitting: Family Medicine

## 2015-11-26 ENCOUNTER — Telehealth: Payer: Self-pay | Admitting: *Deleted

## 2015-11-26 NOTE — Telephone Encounter (Signed)
Patient aware of providers recommendations.  

## 2015-11-26 NOTE — Telephone Encounter (Signed)
Given uncontrolled diabetes I would NOT recommend depomedrol.

## 2015-11-26 NOTE — Telephone Encounter (Signed)
Pt calling wanting to know if he could come in to get shot of Depo Medrol for his allergies, I advised pt needs to come to be seen since his last visit for allergies was in Oct pt wants to come in today. I advised pt no appts were available for today he stated that he is needing this NOW because they are so bad. Pt wants to know since there were no appts can he just come in and get the shot? Please advise!

## 2015-12-22 ENCOUNTER — Other Ambulatory Visit: Payer: Self-pay | Admitting: Family Medicine

## 2015-12-22 MED ORDER — EMPAGLIFLOZIN 25 MG PO TABS: 25.0000 mg | ORAL_TABLET | Freq: Every day | ORAL | Status: DC

## 2016-01-08 ENCOUNTER — Telehealth: Payer: Self-pay | Admitting: Family Medicine

## 2016-01-08 NOTE — Telephone Encounter (Signed)
Patient calling to discuss some stomach issues he seems to be having after he started jardiance, is this normal  [phone]

## 2016-01-08 NOTE — Telephone Encounter (Signed)
Pt is having increased gas, stomach upset and lite dizziness and wanted to know if the jardience was the cause.  Per wtp should not cause those symptoms it could be ibs or virus. Pt aware and informed if getting worse NTBS.

## 2016-01-14 ENCOUNTER — Other Ambulatory Visit: Payer: Self-pay | Admitting: Family Medicine

## 2016-01-14 DIAGNOSIS — G5603 Carpal tunnel syndrome, bilateral upper limbs: Secondary | ICD-10-CM

## 2016-01-18 ENCOUNTER — Telehealth: Payer: Self-pay | Admitting: Internal Medicine

## 2016-01-18 ENCOUNTER — Telehealth: Payer: Self-pay | Admitting: Family Medicine

## 2016-01-18 NOTE — Telephone Encounter (Signed)
Pts wife states he is having bad abdominal pain along with diarrhea and urgency. Pt scheduled to see Amy Esterwood PA 01/20/16@2 :30pm. Wife to call back if pt cannot keep appt.

## 2016-01-18 NOTE — Telephone Encounter (Signed)
Pt would like an appt w/ Dr. Dennard Schaumann for his IBS. Advised pt that Dr. Dennard Schaumann does not have any available appts until Thursday. He would like to speak with Dr. Samella Parr nurse [phone]

## 2016-01-19 ENCOUNTER — Other Ambulatory Visit: Payer: Self-pay | Admitting: Family Medicine

## 2016-01-19 DIAGNOSIS — G5603 Carpal tunnel syndrome, bilateral upper limbs: Secondary | ICD-10-CM

## 2016-01-19 NOTE — Telephone Encounter (Signed)
Pt is having some IBS related sxs that he believes could be coming from the [facility] but has an appt to see GI tomorrow. Told pt to go see what they have to say about it and if he would like he could try stopping the Jardience and see if sxs resolve and if they do restart the Jardience and see if sxs return. That is the only way to tell if it is the medication or not. He verbalizes understanding and will call with an update after the GI appt.

## 2016-01-20 ENCOUNTER — Ambulatory Visit (INDEPENDENT_AMBULATORY_CARE_PROVIDER_SITE_OTHER): Payer: Medicare HMO | Admitting: Physician Assistant

## 2016-01-20 ENCOUNTER — Other Ambulatory Visit: Payer: Medicare HMO

## 2016-01-20 ENCOUNTER — Encounter: Payer: Self-pay | Admitting: Physician Assistant

## 2016-01-20 VITALS — BP 116/64 | HR 76 | Ht 71.0 in | Wt 238.1 lb

## 2016-01-20 DIAGNOSIS — Z86010 Personal history of colonic polyps: Secondary | ICD-10-CM | POA: Diagnosis not present

## 2016-01-20 DIAGNOSIS — R140 Abdominal distension (gaseous): Secondary | ICD-10-CM | POA: Diagnosis not present

## 2016-01-20 DIAGNOSIS — K5732 Diverticulitis of large intestine without perforation or abscess without bleeding: Secondary | ICD-10-CM

## 2016-01-20 DIAGNOSIS — R1032 Left lower quadrant pain: Secondary | ICD-10-CM

## 2016-01-20 MED ORDER — METRONIDAZOLE 500 MG PO TABS: 500.0000 mg | ORAL_TABLET | Freq: Two times a day (BID) | ORAL | Status: DC

## 2016-01-20 MED ORDER — CIPROFLOXACIN HCL 500 MG PO TABS: 500.0000 mg | ORAL_TABLET | Freq: Two times a day (BID) | ORAL | Status: AC

## 2016-01-20 NOTE — Patient Instructions (Signed)
We sent prescriptions to [place], [place], [place].  1. Ciprofloxin 500 mg 2. Metronidazole 500 mg   Take Align probiotic daily, 1 capsule daily.  You can get this at your pharmacy or [facility].  After you finish the antibiotics, start Benefiber daily.

## 2016-01-20 NOTE — Progress Notes (Signed)
I agree with assessment and plans as outlined

## 2016-01-20 NOTE — Progress Notes (Addendum)
Patient ID: Cameron Daniel, male   DOB: 1946/03/25, 70 y.o.   MRN: [id number]   Subjective:    Patient ID: Cameron Daniel, male    DOB: [birth year], 70 y.o.   MRN: [id number]  HPI Cameron Daniel is a pleasant 70 year old white male known to Dr. Henrene Pastor with history of GERD, diverticular disease, IBS and adenomatous colon polyps who comes in today with complaints of abdominal pain gas and bloating. He is status post remote cholecystectomy and uses Colestid with good result. He also has history of coronary artery disease and adult-onset diabetes mellitus. He was last seen here for colonoscopy in July 2008 at which time he had 3 polyps removed all of which were tubular adenomas and was to follow-up in 5 years. He was also noted to have diffuse diverticulosis He says he had to go through the [place] for follow-up colonoscopy because of his lack of insurance and had a colonoscopy done at the [place] in 2013. He thinks he was told that he had polyps. Shortly thereafter he had an episode of severe diverticulitis with what sounds like a microperforation, he was treated with IV antibiotics and then eventually underwent a sigmoid resection by Dr. Lilyan Punt here in [place]. He says he did well and didn't have any problems until about 3 weeks ago when he started having lower abdominal discomfort some mild constipation, a sensation of urge for bowel movement without ability to evacuate any stool. He is also developed a lot of gas and bloating. No melena or hematochezia. Says this reminds him of prior episodes of diverticulitis. No fever or chills. He says chronically he does seem to go back and forth between loose stools and constipation. He also just started on Jardiance  about a month ago and wonders if that's causing any problems with bloating or gas.    Review of Systems Pertinent positive and negative review of systems were noted in the above HPI section.  All other review of systems was otherwise negative.  Outpatient  Encounter Prescriptions as of 01/20/2016  Medication Sig  . aspirin 81 MG tablet Take 81 mg by mouth every morning.   . cholecalciferol (VITAMIN D) 1000 UNITS tablet Take 2,000 Units by mouth daily.  . citalopram (CELEXA) 10 MG tablet Take 20 mg by mouth daily.   . colestipol (COLESTID) 5 G granules Take 5 g by mouth daily.  . empagliflozin (JARDIANCE) 25 MG TABS tablet Take 25 mg by mouth daily.  . fexofenadine (ALLEGRA) 180 MG tablet Take 180 mg by mouth daily as needed. For allergies  . finasteride (PROSCAR) 5 MG tablet Take 1 tablet (5 mg total) by mouth daily.  . fish oil-omega-3 fatty acids 1000 MG capsule Take 500 mg by mouth 2 (two) times daily.  . fluticasone (FLONASE) 50 MCG/ACT nasal spray Place 2 sprays into both nostrils daily.  Marland Kitchen losartan (COZAAR) 100 MG tablet Take 1 tablet (100 mg total) by mouth daily.  . meloxicam (MOBIC) 15 MG tablet Take 1 tablet (15 mg total) by mouth daily.  . Multiple Vitamin (MULTIVITAMIN) tablet Take 1 tablet by mouth daily.   Marland Kitchen olmesartan (BENICAR) 20 MG tablet Take 20 mg by mouth daily.  . pantoprazole (PROTONIX) 40 MG tablet Take 1 tablet (40 mg total) by mouth every morning.  Marland Kitchen PATADAY 0.2 % SOLN INSTILL ONE DROP INTO EACH EYE ONCE DAILY AS NEEDED FOR ITCH  . pioglitazone (ACTOS) 15 MG tablet TAKE ONE TABLET BY MOUTH EVERY MORNING  . Polyethyl Glycol-Propyl Glycol 0.4-0.3 %  SOLN Apply 2 drops to eye 2 (two) times daily.  . Probiotic Product (ALIGN PO) Take by mouth.  . rosuvastatin (CRESTOR) 10 MG tablet Take 1 tablet (10 mg total) by mouth daily.  . ciprofloxacin (CIPRO) 500 MG tablet Take 1 tablet (500 mg total) by mouth 2 (two) times daily.  . metroNIDAZOLE (FLAGYL) 500 MG tablet Take 1 tablet (500 mg total) by mouth 2 (two) times daily.  . [DISCONTINUED] fenofibrate 160 MG tablet Take 1 tablet (160 mg total) by mouth daily.   No facility-administered encounter medications on file as of 01/20/2016.   Allergies  Allergen Reactions  .  Doxazosin Mesylate     Reaction=unknown  . Doxazosin Mesylate Other (See Comments)  . Flomax [Tamsulosin Hcl]     Itching   . Other Other (See Comments)  . Penicillins Rash and Other (See Comments)  . Sulfonamide Derivatives Rash   Patient Active Problem List   Diagnosis Date Noted  . Gout 04/06/2012  . DIVERTICULITIS-COLON 06/18/2010  . FACIAL FLUSHING 12/11/2007  . NOCTURIA 12/11/2007  . DIABETES MELLITUS, TYPE II 09/07/2007  . OTHER MALAISE AND FATIGUE 09/07/2007  . ALLERGIC RHINITIS 08/16/2007  . HYPERLIPIDEMIA 01/24/2007  . OBESITY 01/24/2007  . ERECTILE DYSFUNCTION 01/24/2007  . HYPERTENSION 01/24/2007  . CORONARY ARTERY DISEASE 01/24/2007  . GERD 01/24/2007  . DIVERTICULOSIS, COLON 01/24/2007  . IRRITABLE BOWEL SYNDROME 01/24/2007  . COLONIC POLYPS, HX OF 01/24/2007  . CHOLECYSTECTOMY, HX OF 01/24/2007   Social History   Social History  . Marital Status: Married    Spouse Name: N/A  . Number of Children: 1  . 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 Smoking status: Former Smoker    Quit date: 08/30/1983  . Smokeless tobacco: Former Systems developer  . Alcohol Use: Yes     Comment: rare  . Drug Use: No  . Sexual Activity: Not on file   Other Topics Concern  . Not on file   Social History Narrative    [name]'s family history includes Coronary artery disease in his mother; Lung cancer in his father. There is no history of Colon cancer, Heart attack, Hypertension, or Stroke.      Objective:    Filed Vitals:   01/20/16 1414  BP: 116/64  Pulse: 76    Physical Exam well-developed older white male in no acute distress, very pleasant blood pressure 116/64 pulse 76 height 5 foot 11 weight 238, BMI 33. HEENT; nontraumatic normocephalic EOMI PERRLA sclera anicteric, Cardiovascular; regular rate and rhythm with S1-S2 no murmur or gallop, Pulmonary ;clear bilaterally, Abdomen ;large soft he's tender in the left  lower quadrant is no guarding or rebound no palpable mass or hepatosplenomegaly bowel sounds are present he does have a lower midline incisional scar Rectal; exam not done, Ext; no clubbing cyanosis or edema skin warm and dry, Neuropsych; mood and affect appropriate     Assessment & Plan:   #25 37 -year-old white male with 3 week history of lower abdominal discomfort and change in bowel habits bloating and gas. He is tender in the left lower quadrant. Patient has prior history of diverticulitis and symptoms are reminiscent. #2Status post prior sigmoid resection for diverticular disease 2013 #3History of tubular adenomatous colon polyps last colonoscopy here 2008. Patient relates having colonoscopy in 2013 at the [facility] uncertain of the findings #4Adult-onset diabetes mellitus #5 IBS #6 Status post cholecystectomy #7 Coronary artery disease #8Obesity  Plan; we'll start Cipro 500 mg by mouth twice a day and Flagyl 500 mg by mouth twice a day both 14 days Continue align 1 daily Start Benefiber once daily Patient signed a release and will obtain records from the [facility] regarding colonoscopy and path from 2013 and then determine antral for follow-up colonoscopy as he would like to have any further colonoscopies done here with Dr. Henrene Pastor Patient is asked to call if his symptoms do not resolve with the above regimen.  Addendum; colonoscopy report obtained from the [facility], procedure done for 17 2013 with finding of external hemorrhoids, 2 polyps measuring 5 mm and 7 mm in size removed, noted severe diverticulosis in the sigmoid colon and mild diverticulosis in the ascending colon also had a lipoma in the ascending colon Path on the polyps consistent with tubular adenomatous polyps. Will need 5 year interval follow-up which will be April 2018   Alfredia Ferguson PA-C 01/20/2016   Cc: [name], MD

## 2016-01-21 ENCOUNTER — Telehealth: Payer: Self-pay

## 2016-01-21 NOTE — Telephone Encounter (Signed)
Recall entered in epic.

## 2016-01-21 NOTE — Telephone Encounter (Signed)
-----   Message from Alfredia Ferguson, PA-C sent at 01/21/2016  4:17 PM EDT ----- Regarding: colon recall Please place  Pt for recall colon with  Dr Henrene Pastor in April 2018, hx adenomatous polyps... Prior colon was done at [place] in [place]- received records.. thanks

## 2016-01-22 ENCOUNTER — Emergency Department (HOSPITAL_COMMUNITY)
Admission: EM | Admit: 2016-01-22 | Discharge: 2016-01-22 | Disposition: A | Discharge status: Home / Self Care | Payer: Medicare HMO | Attending: Emergency Medicine | Admitting: Emergency Medicine

## 2016-01-22 ENCOUNTER — Emergency Department (HOSPITAL_COMMUNITY): Payer: Medicare HMO

## 2016-01-22 ENCOUNTER — Encounter (HOSPITAL_COMMUNITY): Payer: Self-pay | Admitting: *Deleted

## 2016-01-22 DIAGNOSIS — Z955 Presence of coronary angioplasty implant and graft: Secondary | ICD-10-CM | POA: Diagnosis not present

## 2016-01-22 DIAGNOSIS — Z791 Long term (current) use of non-steroidal anti-inflammatories (NSAID): Secondary | ICD-10-CM | POA: Diagnosis not present

## 2016-01-22 DIAGNOSIS — I252 Old myocardial infarction: Secondary | ICD-10-CM | POA: Insufficient documentation

## 2016-01-22 DIAGNOSIS — I10 Essential (primary) hypertension: Secondary | ICD-10-CM | POA: Insufficient documentation

## 2016-01-22 DIAGNOSIS — Z792 Long term (current) use of antibiotics: Secondary | ICD-10-CM | POA: Insufficient documentation

## 2016-01-22 DIAGNOSIS — K5732 Diverticulitis of large intestine without perforation or abscess without bleeding: Secondary | ICD-10-CM | POA: Diagnosis not present

## 2016-01-22 DIAGNOSIS — Z79899 Other long term (current) drug therapy: Secondary | ICD-10-CM | POA: Insufficient documentation

## 2016-01-22 DIAGNOSIS — Z87891 Personal history of nicotine dependence: Secondary | ICD-10-CM | POA: Diagnosis not present

## 2016-01-22 DIAGNOSIS — Z7984 Long term (current) use of oral hypoglycemic drugs: Secondary | ICD-10-CM | POA: Insufficient documentation

## 2016-01-22 DIAGNOSIS — Z7982 Long term (current) use of aspirin: Secondary | ICD-10-CM | POA: Diagnosis not present

## 2016-01-22 DIAGNOSIS — R1032 Left lower quadrant pain: Secondary | ICD-10-CM | POA: Diagnosis present

## 2016-01-22 DIAGNOSIS — K5730 Diverticulosis of large intestine without perforation or abscess without bleeding: Secondary | ICD-10-CM | POA: Diagnosis not present

## 2016-01-22 DIAGNOSIS — E119 Type 2 diabetes mellitus without complications: Secondary | ICD-10-CM | POA: Diagnosis not present

## 2016-01-22 LAB — LIPASE, BLOOD: Lipase: 26 U/L (ref 11–51)

## 2016-01-22 LAB — URINALYSIS, ROUTINE W REFLEX MICROSCOPIC
BILIRUBIN URINE: NEGATIVE
GLUCOSE, UA: 250 mg/dL — AB
HGB URINE DIPSTICK: NEGATIVE
KETONES UR: NEGATIVE mg/dL
Leukocytes, UA: NEGATIVE
NITRITE: NEGATIVE
PH: 5.5 (ref 5.0–8.0)
Protein, ur: NEGATIVE mg/dL
SPECIFIC GRAVITY, URINE: 1.007 (ref 1.005–1.030)

## 2016-01-22 LAB — COMPREHENSIVE METABOLIC PANEL
ALBUMIN: 4 g/dL (ref 3.5–5.0)
ALK PHOS: 70 U/L (ref 38–126)
ALT: 27 U/L (ref 17–63)
ANION GAP: 7 (ref 5–15)
AST: 33 U/L (ref 15–41)
BILIRUBIN TOTAL: 0.7 mg/dL (ref 0.3–1.2)
BUN: 17 mg/dL (ref 6–20)
CO2: 25 mmol/L (ref 22–32)
Calcium: 9.8 mg/dL (ref 8.9–10.3)
Chloride: 103 mmol/L (ref 101–111)
Creatinine, Ser: 0.94 mg/dL (ref 0.61–1.24)
GFR calc Af Amer: >60 mL/min (ref >60)
GFR calc non Af Amer: >60 mL/min (ref >60)
GLUCOSE: 172 mg/dL — AB (ref 65–99)
POTASSIUM: 4 mmol/L (ref 3.5–5.1)
SODIUM: 135 mmol/L (ref 135–145)
TOTAL PROTEIN: 6.3 g/dL — AB (ref 6.5–8.1)

## 2016-01-22 LAB — CBC
HEMATOCRIT: 43.1 % (ref 39.0–52.0)
HEMOGLOBIN: 14.4 g/dL (ref 13.0–17.0)
MCH: 28.8 pg (ref 26.0–34.0)
MCHC: 33.4 g/dL (ref 30.0–36.0)
MCV: 86.2 fL (ref 78.0–100.0)
Platelets: 150 10*3/uL (ref 150–400)
RBC: 5 MIL/uL (ref 4.22–5.81)
RDW: 14.5 % (ref 11.5–15.5)
WBC: 9.7 10*3/uL (ref 4.0–10.5)

## 2016-01-22 MED ORDER — MORPHINE SULFATE (PF) 4 MG/ML IV SOLN
4.0000 mg | Freq: Once | INTRAVENOUS | Status: AC
  Administered 2016-01-22: 4 mg via INTRAVENOUS
  Filled 2016-01-22: qty 1

## 2016-01-22 MED ORDER — IOPAMIDOL (ISOVUE-300) INJECTION 61%
100.0000 mL | Freq: Once | INTRAVENOUS | Status: AC | PRN
  Administered 2016-01-22: 100 mL via INTRAVENOUS

## 2016-01-22 MED ORDER — DIATRIZOATE MEGLUMINE & SODIUM 66-10 % PO SOLN
30.0000 mL | Freq: Once | ORAL | Status: AC
  Administered 2016-01-22: 30 mL via ORAL

## 2016-01-22 MED ORDER — SODIUM CHLORIDE 0.9 % IV BOLUS (SEPSIS)
1000.0000 mL | Freq: Once | INTRAVENOUS | Status: AC
  Administered 2016-01-22: 1000 mL via INTRAVENOUS

## 2016-01-22 MED ORDER — OXYCODONE-ACETAMINOPHEN 5-325 MG PO TABS: 1.0000 | ORAL_TABLET | Freq: Three times a day (TID) | ORAL | Status: DC | PRN

## 2016-01-22 NOTE — ED Provider Notes (Signed)
CSN: [id number]     Arrival date & time 01/22/16  0534 History   First MD Initiated Contact with Patient 01/22/16 [phone]     Chief Complaint  Patient presents with  . Abdominal Pain   (Consider location/radiation/quality/duration/timing/severity/associated sxs/prior Treatment) HPI 70 y.o. male with a hx of Diverticulitis, presents to the Emergency Department today complaining of LLQ pain since Wednesday. Pt states that he saw his GI in the office on 01/20/16 and was given Cipro and Flagyl for suspected diverticulitis. Noted pain last night that was worsening. States pain is 7/10 and constant. Worse with movement. Notes no relief with OTC tylenol. No fevers. No CP/SOB. No numbness/tingling. No N/V/D. No blood in stool. Has hx of perforation in 2013. No other symptoms noted.     Past Medical History  Diagnosis Date  . Unspecified essential hypertension   . Other and unspecified hyperlipidemia   . Obesity, unspecified   . Esophageal reflux   . Nocturia   . Flushing   . Other malaise and fatigue   . Type II or unspecified type diabetes mellitus without mention of complication, not stated as uncontrolled   . Allergic rhinitis, cause unspecified   . Other acquired absence of organ   . Psychosexual dysfunction with inhibited sexual excitement   . Irritable bowel syndrome   . Diverticulosis of colon (without mention of hemorrhage)   . Personal history of colonic polyps   . Coronary atherosclerosis of unspecified type of vessel, native or graft     1985 , arthroplasty x 1   . Myocardial infarction ([place])   . Depression     PTSD   . Hemorrhoids   . Esophageal stricture    Past Surgical History  Procedure Laterality Date  . Elbow bursa surgery    . Cholecystectomy    . Shoulder surgery      Rt. Shoulder  . Carpal tunnel release      left   . Coronary angioplasty      1985   . Colon surgery  2013  . Colon resection      18 inches   Family History  Problem Relation Age of Onset  .  Lung cancer Father   . Coronary artery disease Mother   . Colon cancer Neg Hx   . Heart attack Neg Hx   . Hypertension Neg Hx   . Stroke Neg Hx    Social History  Substance Use Topics  . Smoking status: Former Smoker    Quit date: 08/30/1983  . Smokeless tobacco: Former Systems developer  . Alcohol Use: Yes     Comment: rare    Review of Systems ROS reviewed and all are negative for acute change except as noted in the HPI.  Allergies  Doxazosin mesylate; Doxazosin mesylate; Flomax; Other; Penicillins; and Sulfonamide derivatives  Home Medications   Prior to Admission medications   Medication Sig Start Date End Date Taking? Authorizing Provider  aspirin 81 MG tablet Take 81 mg by mouth every morning.     Historical Provider, MD  cholecalciferol (VITAMIN D) 1000 UNITS tablet Take 2,000 Units by mouth daily.    Historical Provider, MD  ciprofloxacin (CIPRO) 500 MG tablet Take 1 tablet (500 mg total) by mouth 2 (two) times daily. 01/20/16 02/03/16  Amy S Esterwood, PA-C  citalopram (CELEXA) 10 MG tablet Take 20 mg by mouth daily.     Historical Provider, MD  colestipol (COLESTID) 5 G granules Take 5 g by mouth daily.  Historical Provider, MD  empagliflozin (JARDIANCE) 25 MG TABS tablet Take 25 mg by mouth daily. 12/22/15   [name], MD  fexofenadine (ALLEGRA) 180 MG tablet Take 180 mg by mouth daily as needed. For allergies    Historical Provider, MD  finasteride (PROSCAR) 5 MG tablet Take 1 tablet (5 mg total) by mouth daily. 06/02/15   [name], MD  fish oil-omega-3 fatty acids 1000 MG capsule Take 500 mg by mouth 2 (two) times daily.    Historical Provider, MD  fluticasone (FLONASE) 50 MCG/ACT nasal spray Place 2 sprays into both nostrils daily. 12/16/14   Alycia Rossetti, MD  losartan (COZAAR) 100 MG tablet Take 1 tablet (100 mg total) by mouth daily. 03/17/15   [name], MD  meloxicam (MOBIC) 15 MG tablet Take 1 tablet (15 mg total) by mouth daily. 11/05/14   [name], MD  metroNIDAZOLE (FLAGYL) 500 MG tablet Take 1 tablet (500 mg total) by mouth 2 (two) times daily. 01/20/16   Amy S Esterwood, PA-C  Multiple Vitamin (MULTIVITAMIN) tablet Take 1 tablet by mouth daily.     Historical Provider, MD  olmesartan (BENICAR) 20 MG tablet Take 20 mg by mouth daily.    Historical Provider, MD  pantoprazole (PROTONIX) 40 MG tablet Take 1 tablet (40 mg total) by mouth every morning. 08/10/15   [name], MD  PATADAY 0.2 % SOLN INSTILL ONE DROP INTO EACH EYE ONCE DAILY AS NEEDED FOR [facility] 11/27/15   [name], MD  pioglitazone (ACTOS) 15 MG tablet TAKE ONE TABLET BY MOUTH EVERY MORNING 12/03/13   [name], MD  Polyethyl Glycol-Propyl Glycol 0.4-0.3 % SOLN Apply 2 drops to eye 2 (two) times daily. 11/05/14   [name], MD  Probiotic Product (ALIGN PO) Take by mouth.    Historical Provider, MD  rosuvastatin (CRESTOR) 10 MG tablet Take 1 tablet (10 mg total) by mouth daily. 10/23/15   [name], MD   BP 125/76 mmHg  Pulse 81  Temp(Src) 98.5 F (36.9 C) (Oral)  Resp 18  SpO2 98%   Physical Exam  Constitutional: He is oriented to person, place, and time. He appears well-developed and well-nourished.  Pt in NAD. Sitting Comfortably during exam  HENT:  Head: Normocephalic and atraumatic.  Eyes: EOM are normal. Pupils are equal, round, and reactive to light.  Neck: Normal range of motion. Neck supple.  Cardiovascular: Normal rate, regular rhythm, normal heart sounds and intact distal pulses.   Pulmonary/Chest: Effort normal and breath sounds normal.  Abdominal: Soft. Normal appearance and bowel sounds are normal. There is tenderness in the left lower quadrant. There is no rigidity, no rebound, no guarding, no tenderness at McBurney's point and negative Murphy's sign.  Musculoskeletal: Normal range of motion.  Neurological: He is alert and oriented to person, place, and time.  Skin: Skin is warm and dry.  Psychiatric: He has a normal  mood and affect. His behavior is normal. Thought content normal.  Nursing note and vitals reviewed.  ED Course  Procedures (including critical care time) Labs Review Labs Reviewed  COMPREHENSIVE METABOLIC PANEL - Abnormal; Notable for the following:    Glucose, Bld 172 (*)    Total Protein 6.3 (*)    All other components within normal limits  URINALYSIS, ROUTINE W REFLEX MICROSCOPIC (NOT AT [facility]) - Abnormal; Notable for the following:    Glucose, UA 250 (*)    All other components within normal  limits  LIPASE, BLOOD  CBC   Imaging Review Ct Abdomen Pelvis W Contrast  01/22/2016  CLINICAL DATA:  Left lower quadrant abdominal pain for 1 day EXAM: CT ABDOMEN AND PELVIS WITH CONTRAST TECHNIQUE: Multidetector CT imaging of the abdomen and pelvis was performed using the standard protocol following bolus administration of intravenous contrast. CONTRAST:  158mL ISOVUE-300 IOPAMIDOL (ISOVUE-300) INJECTION 61% COMPARISON:  Plain films 03/12/2013.  CT 02/23/2012. FINDINGS: Lower chest: Lung bases are clear. No effusions. Heart is normal size. The visualized coronary arteries are diffusely calcified. Hepatobiliary: Prior cholecystectomy. Small hypodensities scattered within the liver, likely small cysts. Pancreas: No focal abnormality or ductal dilatation. Spleen: No focal abnormality.  Normal size. Adrenals/Urinary Tract: No renal or ureteral stones. No hydronephrosis. No focal renal abnormality. Mild bilateral perinephric stranding likely reflects remote insults. Adrenal glands and urinary bladder grossly unremarkable. Stomach/Bowel: Sigmoid and distal descending colonic diverticulosis. Mild inflammatory stranding around the proximal sigmoid colon. Stomach and small bowel decompressed. Vascular/Lymphatic: Aortic calcifications without aneurysm. No adenopathy. Reproductive: No visible focal abnormality. Other: No free fluid or free air. Musculoskeletal: No acute bony abnormality or focal bone lesion.  Degenerative changes in the lumbar spine. IMPRESSION: Descending colonic and sigmoid diverticulosis with changes of active diverticulitis in the proximal sigmoid colon. Coronary artery disease, aortic atherosclerosis. Electronically Signed   By: Rolm Baptise M.D.   On: 01/22/2016 08:33   I have personally reviewed and evaluated these images and lab results as part of my medical decision-making.   EKG Interpretation None     MDM  I have reviewed and evaluated the relevant laboratory values I have reviewed and evaluated the relevant imaging studies. I have reviewed the relevant previous healthcare records. I obtained HPI from historian.  ED Course:  Assessment: Pt is a 70yM with hx Diverticulitis who presents with LLQ pain. Seen at GI office and given Cipro/Flagyl on Wednesday. Increased pain last night. Concern for perf as pt has history before. On exam, pt in NAD. Nontoxic/nonseptic appearing. VSS. Afebrile. Lungs CTA. Heart RRR. Abdomen TTP LLQ. Labs unremarkable. UA unremarkable. CT ABD/Pelvis with active diverticulitis. Given morphine in ED. Plan is to DC home with follow up to GI if symptoms continue. Given Rx Percocet #12. Instructed to continue ABX given by GI. At time of discharge, Patient is in no acute distress. Vital Signs are stable. Patient is able to ambulate. Patient able to tolerate PO.   Disposition/Plan:  DC Home Additional Verbal discharge instructions given and discussed with patient.  Pt Instructed to f/u with GI for evaluation and treatment of symptoms if they do not improve. Strict return precautions given Pt acknowledges and agrees with plan  Supervising Physician Varney Biles, MD   Final diagnoses:  Diverticulitis of large intestine without perforation or abscess without bleeding      Shary Decamp, PA-C 01/22/16 [id number]  Varney Biles, MD 01/22/16 [id number]

## 2016-01-22 NOTE — Discharge Instructions (Signed)
Please read and follow all provided instructions.  Your diagnoses today include:  1. Diverticulitis of large intestine without perforation or abscess without bleeding    Tests performed today include:  Blood counts and electrolytes  Blood tests to check liver and kidney function  Blood tests to check pancreas function  Vital signs. See below for your results today.   Medications prescribed:   Take any prescribed medications only as directed.  Home care instructions:   Follow any educational materials contained in this packet.  Follow-up instructions: Please follow-up with Gastroenterologist for further evaluation of your symptoms if they do not improve.    Return instructions:  SEEK IMMEDIATE MEDICAL ATTENTION IF:  The pain does not go away or becomes severe   A temperature above 101F develops   Repeated vomiting occurs (multiple episodes)   The pain becomes localized to portions of the abdomen. The right side could possibly be appendicitis. In an adult, the left lower portion of the abdomen could be colitis or diverticulitis.   Blood is being passed in stools or vomit (bright red or black tarry stools)   You develop chest pain, difficulty breathing, dizziness or fainting, or become confused, poorly responsive, or inconsolable (young children)  If you have any other emergent concerns regarding your health  Additional Information: Abdominal (belly) pain can be caused by many things. Your caregiver performed an examination and possibly ordered blood/urine tests and imaging (CT scan, x-rays, ultrasound). Many cases can be observed and treated at home after initial evaluation in the emergency department. Even though you are being discharged home, abdominal pain can be unpredictable. Therefore, you need a repeated exam if your pain does not resolve, returns, or worsens. Most patients with abdominal pain don't have to be admitted to the hospital or have surgery, but serious  problems like appendicitis and gallbladder attacks can start out as nonspecific pain. Many abdominal conditions cannot be diagnosed in one visit, so follow-up evaluations are very important.  Your vital signs today were: BP 131/72 mmHg   Pulse 83   Temp(Src) 98.5 F (36.9 C) (Oral)   Resp 16   SpO2 97% If your blood pressure (bp) was elevated above 135/85 this visit, please have this repeated by your doctor within one month. --------------

## 2016-01-22 NOTE — ED Notes (Signed)
Pt c/o LLQ abdominal pain,, was diagnosed with diverticulitis on Wednesday, given Cipro and flagyl, reports pain much worse today. No n/v/d

## 2016-01-28 ENCOUNTER — Ambulatory Visit
Admission: RE | Admit: 2016-01-28 | Discharge: 2016-01-28 | Disposition: A | Discharge status: Home / Self Care | Payer: No Typology Code available for payment source | Source: Ambulatory Visit | Attending: Family Medicine | Admitting: Family Medicine

## 2016-01-28 DIAGNOSIS — G5603 Carpal tunnel syndrome, bilateral upper limbs: Secondary | ICD-10-CM

## 2016-02-16 ENCOUNTER — Telehealth: Payer: Self-pay | Admitting: Family Medicine

## 2016-02-16 NOTE — Telephone Encounter (Signed)
LMTRC

## 2016-02-16 NOTE — Telephone Encounter (Signed)
Patient called and would like you to call him back about a form from the [place].   CB#[phone]

## 2016-02-22 DIAGNOSIS — M545 Low back pain: Secondary | ICD-10-CM | POA: Diagnosis not present

## 2016-02-22 DIAGNOSIS — M5030 Other cervical disc degeneration, unspecified cervical region: Secondary | ICD-10-CM | POA: Diagnosis not present

## 2016-02-22 DIAGNOSIS — M47817 Spondylosis without myelopathy or radiculopathy, lumbosacral region: Secondary | ICD-10-CM | POA: Diagnosis not present

## 2016-02-22 DIAGNOSIS — S139XXA Sprain of joints and ligaments of unspecified parts of neck, initial encounter: Secondary | ICD-10-CM | POA: Diagnosis not present

## 2016-03-03 ENCOUNTER — Encounter: Payer: Self-pay | Admitting: Family Medicine

## 2016-03-03 ENCOUNTER — Ambulatory Visit (INDEPENDENT_AMBULATORY_CARE_PROVIDER_SITE_OTHER): Payer: Medicare HMO | Admitting: Family Medicine

## 2016-03-03 VITALS — BP 120/68 | HR 78 | Temp 97.7°F | Resp 16 | Ht 71.0 in | Wt 239.0 lb

## 2016-03-03 DIAGNOSIS — I10 Essential (primary) hypertension: Secondary | ICD-10-CM | POA: Diagnosis not present

## 2016-03-03 DIAGNOSIS — E1100 Type 2 diabetes mellitus with hyperosmolarity without nonketotic hyperglycemic-hyperosmolar coma (NKHHC): Secondary | ICD-10-CM | POA: Diagnosis not present

## 2016-03-03 DIAGNOSIS — E785 Hyperlipidemia, unspecified: Secondary | ICD-10-CM

## 2016-03-03 DIAGNOSIS — I2510 Atherosclerotic heart disease of native coronary artery without angina pectoris: Secondary | ICD-10-CM | POA: Diagnosis not present
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
## 2016-03-10 ENCOUNTER — Encounter: Payer: Self-pay | Admitting: Family Medicine

## 2016-03-10 ENCOUNTER — Ambulatory Visit (INDEPENDENT_AMBULATORY_CARE_PROVIDER_SITE_OTHER): Payer: Medicare HMO | Admitting: Family Medicine

## 2016-03-10 ENCOUNTER — Other Ambulatory Visit: Payer: Medicare HMO

## 2016-03-10 VITALS — BP 120/62 | HR 68 | Temp 97.8°F | Resp 16 | Ht 71.0 in | Wt 239.0 lb

## 2016-03-10 DIAGNOSIS — R300 Dysuria: Secondary | ICD-10-CM

## 2016-03-10 DIAGNOSIS — B3742 Candidal balanitis: Secondary | ICD-10-CM | POA: Diagnosis not present

## 2016-03-10 DIAGNOSIS — E1100 Type 2 diabetes mellitus with hyperosmolarity without nonketotic hyperglycemic-hyperosmolar coma (NKHHC): Secondary | ICD-10-CM | POA: Diagnosis not present

## 2016-03-10 LAB — CBC WITH DIFFERENTIAL/PLATELET
BASOS ABS: 0 {cells}/uL (ref 0–200)
Basophils Relative: 0 %
EOS PCT: 4 %
Eosinophils Absolute: 268 cells/uL (ref 15–500)
HCT: 45.7 % (ref 38.5–50.0)
Hemoglobin: 15 g/dL (ref 13.0–17.0)
Lymphocytes Relative: 31 %
Lymphs Abs: 2077 cells/uL (ref 850–3900)
MCH: 28.7 pg (ref 27.0–33.0)
MCHC: 32.8 g/dL (ref 32.0–36.0)
MCV: 87.4 fL (ref 80.0–100.0)
MONOS PCT: 6 %
MPV: 9.8 fL (ref 7.5–12.5)
Monocytes Absolute: 402 cells/uL (ref 200–950)
NEUTROS ABS: 3953 {cells}/uL (ref 1500–7800)
Neutrophils Relative %: 59 %
PLATELETS: 146 10*3/uL (ref 140–400)
RBC: 5.23 MIL/uL (ref 4.20–5.80)
RDW: 14.8 % (ref 11.0–15.0)
WBC: 6.7 10*3/uL (ref 3.8–10.8)

## 2016-03-10 LAB — COMPLETE METABOLIC PANEL WITH GFR
ALT: 28 U/L (ref 9–46)
AST: 28 U/L (ref 10–35)
Albumin: 4.2 g/dL (ref 3.6–5.1)
Alkaline Phosphatase: 71 U/L (ref 40–115)
BUN: 14 mg/dL (ref 7–25)
CHLORIDE: 103 mmol/L (ref 98–110)
CO2: 23 mmol/L (ref 20–31)
Calcium: 9.7 mg/dL (ref 8.6–10.3)
Creat: 0.93 mg/dL (ref 0.70–1.25)
GFR, EST NON AFRICAN AMERICAN: 83 mL/min (ref >=60)
GFR, Est African American: >89 mL/min (ref >=60)
GLUCOSE: 146 mg/dL — AB (ref 70–99)
POTASSIUM: 4 mmol/L (ref 3.5–5.3)
SODIUM: 138 mmol/L (ref 135–146)
Total Bilirubin: 0.6 mg/dL (ref 0.2–1.2)
Total Protein: 6.1 g/dL (ref 6.1–8.1)

## 2016-03-10 LAB — URINALYSIS, ROUTINE W REFLEX MICROSCOPIC
Bilirubin Urine: NEGATIVE
Hgb urine dipstick: NEGATIVE
Ketones, ur: NEGATIVE
Leukocytes, UA: NEGATIVE
Nitrite: NEGATIVE
PH: 5 (ref 5.0–8.0)
Protein, ur: NEGATIVE
SPECIFIC GRAVITY, URINE: 1.015 (ref 1.001–1.035)

## 2016-03-10 LAB — LIPID PANEL
CHOL/HDL RATIO: 2.3 ratio (ref <=5.0)
Cholesterol: 133 mg/dL (ref 125–200)
HDL: 59 mg/dL (ref >=40)
LDL CALC: 35 mg/dL (ref <130)
Triglycerides: 194 mg/dL — ABNORMAL HIGH (ref <150)
VLDL: 39 mg/dL — ABNORMAL HIGH (ref <30)

## 2016-03-10 LAB — HEMOGLOBIN A1C
HEMOGLOBIN A1C: 6.9 % — AB (ref <5.7)
Mean Plasma Glucose: 151 mg/dL

## 2016-03-10 MED ORDER — FLUCONAZOLE 150 MG PO TABS: 150.0000 mg | ORAL_TABLET | Freq: Once | ORAL | Status: DC

## 2016-03-10 MED ORDER — CLOTRIMAZOLE 1 % EX CREA: 1.0000 "application " | TOPICAL_CREAM | Freq: Two times a day (BID) | CUTANEOUS | Status: DC

## 2016-03-10 NOTE — Progress Notes (Signed)
Subjective:    Patient ID: Cameron Daniel, male    DOB: 1945/10/15, 70 y.o.   MRN: [id number]  HPI Patient is currently on Jardiance.  Approximately 1 week ago, he developed a white cheesy-like rash on the head of his penis with exudative discharge up underneath his foreskin consistent with candida balanitis. He also reports dysuria but his urinalysis today is clear. Examination of the head of the penis reveals an erythematous glans with white cottage cheeselike exudate underneath the foreskin at the proximal base of the glans Past Medical History  Diagnosis Date  . Unspecified essential hypertension   . Other and unspecified hyperlipidemia   . Obesity, unspecified   . Esophageal reflux   . Nocturia   . Flushing   . Other malaise and fatigue   . Type II or unspecified type diabetes mellitus without mention of complication, not stated as uncontrolled   . Allergic rhinitis, cause unspecified   . Other acquired absence of organ   . Psychosexual dysfunction with inhibited sexual excitement   . Irritable bowel syndrome   . Diverticulosis of colon (without mention of hemorrhage)   . Personal history of colonic polyps   . Coronary atherosclerosis of unspecified type of vessel, native or graft     1985 , arthroplasty x 1   . Myocardial infarction ([place])   . Depression     PTSD   . Hemorrhoids   . Esophageal stricture    Past Surgical History  Procedure Laterality Date  . Elbow bursa surgery    . Cholecystectomy    . Shoulder surgery      Rt. Shoulder  . Carpal tunnel release      left   . Coronary angioplasty      1985   . Colon surgery  2013  . Colon resection      18 inches   Current Outpatient Prescriptions on File Prior to Visit  Medication Sig Dispense Refill  . aspirin 81 MG tablet Take 81 mg by mouth every morning.     . cholecalciferol (VITAMIN D) 1000 UNITS tablet Take 2,000 Units by mouth daily.    . citalopram (CELEXA) 10 MG tablet Take 20 mg by mouth daily.      . colestipol (COLESTID) 5 G granules Take 5 g by mouth daily.    . empagliflozin (JARDIANCE) 25 MG TABS tablet Take 25 mg by mouth daily. 30 tablet 5  . fexofenadine (ALLEGRA) 180 MG tablet Take 180 mg by mouth daily as needed. For allergies    . finasteride (PROSCAR) 5 MG tablet Take 1 tablet (5 mg total) by mouth daily. 30 tablet 5  . fish oil-omega-3 fatty acids 1000 MG capsule Take 500 mg by mouth 2 (two) times daily.    . fluticasone (FLONASE) 50 MCG/ACT nasal spray Place 2 sprays into both nostrils daily. (Patient taking differently: Place 1 spray into both nostrils daily as needed for allergies. ) 16 g 6  . meloxicam (MOBIC) 15 MG tablet Take 1 tablet (15 mg total) by mouth daily. 90 tablet 3  . Multiple Vitamin (MULTIVITAMIN) tablet Take 1 tablet by mouth daily.     Marland Kitchen olmesartan (BENICAR) 20 MG tablet Take 20 mg by mouth daily.    Marland Kitchen oxyCODONE-acetaminophen (PERCOCET/ROXICET) 5-325 MG tablet Take 1 tablet by mouth every 8 (eight) hours as needed for severe pain. 12 tablet 0  . pantoprazole (PROTONIX) 40 MG tablet Take 1 tablet (40 mg total) by mouth every morning. 30 tablet  11  . PATADAY 0.2 % SOLN INSTILL ONE DROP INTO EACH EYE ONCE DAILY AS NEEDED FOR ITCH 2.5 mL 5  . pioglitazone (ACTOS) 15 MG tablet TAKE ONE TABLET BY MOUTH EVERY MORNING 90 tablet 0  . Polyethyl Glycol-Propyl Glycol 0.4-0.3 % SOLN Apply 2 drops to eye 2 (two) times daily. 30 mL 1  . Probiotic Product (ALIGN PO) Take by mouth.    . rosuvastatin (CRESTOR) 10 MG tablet Take 1 tablet (10 mg total) by mouth daily. 90 tablet 4   No current facility-administered medications on file prior to visit.   Allergies  Allergen Reactions  . Doxazosin Mesylate     Reaction=unknown  . Doxazosin Mesylate Other (See Comments)  . Flomax [Tamsulosin Hcl]     Itching   . Other Other (See Comments)  . Penicillins Rash and Other (See Comments)    Has patient had a PCN reaction causing immediate rash, facial/tongue/throat swelling,  SOB or lightheadedness with hypotension: No Has patient had a PCN reaction causing severe rash involving mucus membranes or skin necrosis: No Has patient had a PCN reaction that required hospitalization No Has patient had a PCN reaction occurring within the last 10 years: No If all of the above answers are "NO", then may proceed with Cephalosporin use.   . Sulfonamide Derivatives Rash   Social History   Social History  . Marital Status: Married    Spouse Name: N/A  . Number of Children: 1  . 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 Smoking status: Former Smoker    Quit date: 08/30/1983  . Smokeless tobacco: Former Systems developer  . Alcohol Use: Yes     Comment: rare  . Drug Use: No  . Sexual Activity: Not on file   Other Topics Concern  . Not on file   Social History Narrative      Review of Systems  All other systems reviewed and are negative.      Objective:   Physical Exam  Cardiovascular: Normal rate, regular rhythm and normal heart sounds.   Pulmonary/Chest: Effort normal and breath sounds normal.  Skin: Rash noted. There is erythema.  Vitals reviewed.         Assessment & Plan:  Candidal balanitis - Plan: fluconazole (DIFLUCAN) 150 MG tablet, clotrimazole (LOTRIMIN AF) 1 % cream  Temporarily discontinue jardiance.  Begin Diflucan 150 mg by mouth 1 now. Lotrimin cream applied 3 times a day to the head of the penis for 2 weeks. Resume diabetes medication once rash has resolved. We discussed strategies and techniques to prevent this in the future

## 2016-03-11 LAB — MICROALBUMIN, URINE: Microalb, Ur: 0.6 mg/dL

## 2016-03-22 ENCOUNTER — Telehealth: Payer: Self-pay | Admitting: *Deleted

## 2016-03-22 ENCOUNTER — Ambulatory Visit (INDEPENDENT_AMBULATORY_CARE_PROVIDER_SITE_OTHER): Payer: Medicare HMO | Admitting: Family Medicine

## 2016-03-22 ENCOUNTER — Encounter: Payer: Self-pay | Admitting: Family Medicine

## 2016-03-22 VITALS — BP 130/68 | HR 82 | Temp 98.1°F | Resp 18 | Ht 71.0 in | Wt 242.0 lb

## 2016-03-22 DIAGNOSIS — J302 Other seasonal allergic rhinitis: Secondary | ICD-10-CM

## 2016-03-22 DIAGNOSIS — B3742 Candidal balanitis: Secondary | ICD-10-CM

## 2016-03-22 DIAGNOSIS — H109 Unspecified conjunctivitis: Secondary | ICD-10-CM | POA: Diagnosis not present

## 2016-03-22 DIAGNOSIS — Z125 Encounter for screening for malignant neoplasm of prostate: Secondary | ICD-10-CM | POA: Diagnosis not present

## 2016-03-22 MED ORDER — CLOTRIMAZOLE 1 % EX CREA: 1.0000 "application " | TOPICAL_CREAM | Freq: Two times a day (BID) | CUTANEOUS | 0 refills | Status: DC

## 2016-03-22 MED ORDER — POLYMYXIN B-TRIMETHOPRIM 10000-0.1 UNIT/ML-% OP SOLN: 1.0000 [drp] | Freq: Four times a day (QID) | OPHTHALMIC | 0 refills | Status: AC

## 2016-03-22 MED ORDER — OLOPATADINE HCL 0.2 % OP SOLN
OPHTHALMIC | 5 refills | Status: DC
Start: 2016-03-22 — End: 2016-07-01

## 2016-03-22 MED ORDER — POLYMYXIN B-TRIMETHOPRIM 10000-0.1 UNIT/ML-% OP SOLN: 1.0000 [drp] | Freq: Four times a day (QID) | OPHTHALMIC | 0 refills | Status: DC

## 2016-03-22 NOTE — Addendum Note (Signed)
Addended by: Vic Blackbird F on: 03/22/2016 05:05 PM   Modules accepted: Orders

## 2016-03-22 NOTE — Telephone Encounter (Signed)
Received call from patient.   Reports that prescription for eye infection was not sent to pharmacy.   MD please advise.

## 2016-03-22 NOTE — Telephone Encounter (Signed)
Medication was sent, please call pharmacy and make sure they received

## 2016-03-22 NOTE — Progress Notes (Signed)
    Subjective:    Patient ID: Cameron Daniel, male    DOB: Oct 22, 1945, 70 y.o.   MRN: [id number]  Patient presents for Illness (sinus inflammation- sinus pressure, HA on frontal sinuses); Other (L Eyelid edema- x1 week- reports that he had something in eye and now skin around eye is swollen); and Other (possible Yeast infection d/t jardiance) Patient with multiple complaints He actually scheduled visit because of some swelling in his left eyeafter his Dog jumped up on him, he felt some grit in his eye. He had swelling of lower lid and  some pressure above it.  he has had some watery drainage she's been using over-the-counter lubricating drops which has helped the swelling and irritation.  He did not have any pus that he noted but states that his eyes been red. He also like a refill of his allergy drops in general. He has had some congestion he is using Flonase which helped with his allergies it is worse when he is outside medically he also has Allegra but has not been using this.  He was being treated for balanitis secondary to jardiance he is out of his cream it is helping to resolve the issue. He is currently holding the  jardiance  he has will plan to try again. States blood sugars have been okay  Requested PSA be done - not done with last set of labs are too early   Review Of Systems:  GEN- denies fatigue, fever, weight loss,weakness, recent illness HEENT+eye drainage,  Denies change in vision, nasal discharge, CVS- denies chest pain, palpitations RESP- denies SOB, cough, wheeze ABD- denies N/V, change in stools, abd pain GU- denies dysuria, hematuria, dribbling, incontinence MSK- denies joint pain, muscle aches, injury Neuro- denies headache, dizziness, syncope, seizure activity       Objective:    BP 130/68 (BP Location: Left Arm, Patient Position: Sitting, Cuff Size: Large)   Pulse 82   Temp 98.1 F (36.7 C) (Oral)   Resp 18   Ht 5\' 11"  (1.803 m)   Wt 242 lb (109.8 kg)    BMI 33.75 kg/m  GEN- NAD, alert and oriented x3 HEENT- PERRL, EOMI, non injected sclera,injected left  Conjunctiva, vision grossly in tact, no swelling of lid  MMM, oropharynx clear, TM clear bilat, nares clear  Neck- Supple, no LAD  CVS- RRR, no murmur RESP-CTAB GU- Penis- uncircumcised, cream noted, minimal erythema, no pustules, no penile discharge         Assessment & Plan:      Problem List Items Addressed This Visit    None    Visit Diagnoses    Conjunctivitis of left eye    -  Primary   Treat for conjunctivitis, vision in tact, erythema lower conjunctiva, no swelling noted, no sign of sinusitis   Candidal balanitis       improving with treatment  with clotrimazole   Relevant Medications   clotrimazole (LOTRIMIN AF) 1 % cream   Seasonal allergies       Pataday. allergra, flonase    Screening PSA (prostate specific antigen)       Relevant Orders   PSA, Medicare      Note: This dictation was prepared with Dragon dictation along with smaller phrase technology. Any transcriptional errors that result from this process are unintentional.

## 2016-03-22 NOTE — Patient Instructions (Signed)
Take your allegra Use antibiotic drop Pataday drops sent for your allergies More cream sent to pharmacy F/U with Dr. Dennard Schaumann as scheduled

## 2016-03-22 NOTE — Telephone Encounter (Signed)
Call placed to pharmacy.   Was advised that Polytrim is on backorder a will be available on 03/23/2016.  MD advised and states that pt can wait to pick up until 03/23/2016.  Call placed to patient and patient made aware per VM.

## 2016-03-23 LAB — PSA, MEDICARE: PSA: 1.94 ng/mL (ref <=4.00)

## 2016-03-24 ENCOUNTER — Other Ambulatory Visit: Payer: Self-pay | Admitting: Family Medicine

## 2016-03-25 MED ORDER — OLMESARTAN MEDOXOMIL 20 MG PO TABS: 20.0000 mg | ORAL_TABLET | Freq: Every day | ORAL | 3 refills | Status: DC

## 2016-03-25 NOTE — Addendum Note (Signed)
Addended by: Shary Decamp B on: 03/25/2016 08:46 AM   Modules accepted: Orders

## 2016-03-25 NOTE — Telephone Encounter (Signed)
Medication called/sent to requested pharmacy  

## 2016-03-28 DIAGNOSIS — M47817 Spondylosis without myelopathy or radiculopathy, lumbosacral region: Secondary | ICD-10-CM | POA: Diagnosis not present

## 2016-03-28 DIAGNOSIS — M5030 Other cervical disc degeneration, unspecified cervical region: Secondary | ICD-10-CM | POA: Diagnosis not present

## 2016-03-30 DIAGNOSIS — L739 Follicular disorder, unspecified: Secondary | ICD-10-CM | POA: Diagnosis not present

## 2016-04-05 DIAGNOSIS — M542 Cervicalgia: Secondary | ICD-10-CM | POA: Diagnosis not present

## 2016-04-05 DIAGNOSIS — M545 Low back pain: Secondary | ICD-10-CM | POA: Diagnosis not present

## 2016-04-08 DIAGNOSIS — M545 Low back pain: Secondary | ICD-10-CM | POA: Diagnosis not present

## 2016-04-08 DIAGNOSIS — M542 Cervicalgia: Secondary | ICD-10-CM | POA: Diagnosis not present

## 2016-04-11 DIAGNOSIS — M545 Low back pain: Secondary | ICD-10-CM | POA: Diagnosis not present

## 2016-04-11 DIAGNOSIS — M542 Cervicalgia: Secondary | ICD-10-CM | POA: Diagnosis not present

## 2016-04-12 ENCOUNTER — Telehealth: Payer: Self-pay | Admitting: Family Medicine

## 2016-04-12 NOTE — Telephone Encounter (Signed)
Pt called LMOVM stating that the urination medication is not working that he is going about 20 times a day and it is on a very urgent basis. Would like to know what else he can do/take?

## 2016-04-14 NOTE — Telephone Encounter (Signed)
I am assuming they mean finasteride.  Try samples of myrbetriq 25 mg poqday and increase to 50 mg poqday in 1 week.  Get samples here.

## 2016-04-14 NOTE — Telephone Encounter (Signed)
LMOVM about instructions to take medication and to pick up samples

## 2016-04-15 DIAGNOSIS — M542 Cervicalgia: Secondary | ICD-10-CM | POA: Diagnosis not present

## 2016-04-15 DIAGNOSIS — M545 Low back pain: Secondary | ICD-10-CM | POA: Diagnosis not present

## 2016-04-15 NOTE — Telephone Encounter (Signed)
Called and LMOVM with wife to pu samples

## 2016-04-18 ENCOUNTER — Other Ambulatory Visit: Payer: Medicare HMO

## 2016-04-18 DIAGNOSIS — N318 Other neuromuscular dysfunction of bladder: Secondary | ICD-10-CM | POA: Diagnosis not present

## 2016-04-18 DIAGNOSIS — N3281 Overactive bladder: Secondary | ICD-10-CM

## 2016-04-19 DIAGNOSIS — M545 Low back pain: Secondary | ICD-10-CM | POA: Diagnosis not present

## 2016-04-19 DIAGNOSIS — M542 Cervicalgia: Secondary | ICD-10-CM | POA: Diagnosis not present

## 2016-04-19 LAB — URINALYSIS, ROUTINE W REFLEX MICROSCOPIC
BILIRUBIN URINE: NEGATIVE
Glucose, UA: NEGATIVE
Hgb urine dipstick: NEGATIVE
KETONES UR: NEGATIVE
Leukocytes, UA: NEGATIVE
Nitrite: NEGATIVE
PH: 5 (ref 5.0–8.0)
PROTEIN: NEGATIVE
SPECIFIC GRAVITY, URINE: 1.03 (ref 1.001–1.035)

## 2016-04-21 DIAGNOSIS — M542 Cervicalgia: Secondary | ICD-10-CM | POA: Diagnosis not present

## 2016-04-21 DIAGNOSIS — M545 Low back pain: Secondary | ICD-10-CM | POA: Diagnosis not present

## 2016-04-26 DIAGNOSIS — M542 Cervicalgia: Secondary | ICD-10-CM | POA: Diagnosis not present

## 2016-04-26 DIAGNOSIS — M545 Low back pain: Secondary | ICD-10-CM | POA: Diagnosis not present

## 2016-04-28 DIAGNOSIS — M545 Low back pain: Secondary | ICD-10-CM | POA: Diagnosis not present

## 2016-04-28 DIAGNOSIS — M542 Cervicalgia: Secondary | ICD-10-CM | POA: Diagnosis not present

## 2016-04-29 DIAGNOSIS — M5030 Other cervical disc degeneration, unspecified cervical region: Secondary | ICD-10-CM | POA: Diagnosis not present

## 2016-04-29 DIAGNOSIS — M47817 Spondylosis without myelopathy or radiculopathy, lumbosacral region: Secondary | ICD-10-CM | POA: Diagnosis not present

## 2016-05-16 ENCOUNTER — Ambulatory Visit (INDEPENDENT_AMBULATORY_CARE_PROVIDER_SITE_OTHER): Payer: Medicare HMO | Admitting: Family Medicine

## 2016-05-16 ENCOUNTER — Encounter: Payer: Self-pay | Admitting: Family Medicine

## 2016-05-16 VITALS — BP 118/70 | HR 76 | Temp 98.3°F | Resp 18 | Ht 71.0 in | Wt 246.0 lb

## 2016-05-16 DIAGNOSIS — B3742 Candidal balanitis: Secondary | ICD-10-CM | POA: Diagnosis not present

## 2016-05-16 DIAGNOSIS — E1100 Type 2 diabetes mellitus with hyperosmolarity without nonketotic hyperglycemic-hyperosmolar coma (NKHHC): Secondary | ICD-10-CM

## 2016-05-16 MED ORDER — FLUCONAZOLE 150 MG PO TABS: 150.0000 mg | ORAL_TABLET | Freq: Once | ORAL | 0 refills | Status: AC

## 2016-05-16 MED ORDER — DULAGLUTIDE 1.5 MG/0.5ML ~~LOC~~ SOAJ: 1.5000 mg | SUBCUTANEOUS | 5 refills | Status: DC

## 2016-05-16 MED ORDER — CLOTRIMAZOLE 1 % EX CREA: 1.0000 "application " | TOPICAL_CREAM | Freq: Two times a day (BID) | CUTANEOUS | 0 refills | Status: DC

## 2016-05-16 NOTE — Progress Notes (Signed)
Subjective:    Patient ID: Cameron Daniel, male    DOB: 16-Aug-1946, 70 y.o.   MRN: [id number]  HPI  02/2016 Patient is currently on Jardiance.  Approximately 1 week ago, he developed a white cheesy-like rash on the head of his penis with exudative discharge up underneath his foreskin consistent with candida balanitis. He also reports dysuria but his urinalysis today is clear. Examination of the head of the penis reveals an erythematous glans with white cottage cheeselike exudate underneath the foreskin at the proximal base of the glans.  At that time, my plan was: Temporarily discontinue jardiance.  Begin Diflucan 150 mg by mouth 1 now. Lotrimin cream applied 3 times a day to the head of the penis for 2 weeks. Resume diabetes medication once rash has resolved. We discussed strategies and techniques to prevent this in the future  05/16/16 Patient Is having another reaction to Jardiance. He has erythematous glans penis. He has white cheesy like discharge underneath his foreskin. He is itching all over his body although there is no visible rash. He wants to discontinue the medication. Past Medical History:  Diagnosis Date  . Allergic rhinitis, cause unspecified   . Coronary atherosclerosis of unspecified type of vessel, native or graft    1985 , arthroplasty x 1   . Depression    PTSD   . Diverticulosis of colon (without mention of hemorrhage)   . Esophageal reflux   . Esophageal stricture   . Flushing   . Hemorrhoids   . Irritable bowel syndrome   . Myocardial infarction ([place])   . Nocturia   . Obesity, unspecified   . Other acquired absence of organ   . Other and unspecified hyperlipidemia   . Other malaise and fatigue   . Personal history of colonic polyps   . Psychosexual dysfunction with inhibited sexual excitement   . Type II or unspecified type diabetes mellitus without mention of complication, not stated as uncontrolled   . Unspecified essential hypertension    Past  Surgical History:  Procedure Laterality Date  . CARPAL TUNNEL RELEASE     left   . CHOLECYSTECTOMY    . COLON RESECTION     18 inches  . COLON SURGERY  2013  . CORONARY ANGIOPLASTY     1985   . ELBOW BURSA SURGERY    . SHOULDER SURGERY     Rt. Shoulder   Current Outpatient Prescriptions on File Prior to Visit  Medication Sig Dispense Refill  . aspirin 81 MG tablet Take 81 mg by mouth every morning.     Marland Kitchen BENICAR 20 MG tablet TAKE 1 TABLET (20 MG TOTAL) BY MOUTH DAILY. 90 tablet 3  . cholecalciferol (VITAMIN D) 1000 UNITS tablet Take 2,000 Units by mouth daily.    . citalopram (CELEXA) 10 MG tablet Take 20 mg by mouth daily.     . clotrimazole (LOTRIMIN AF) 1 % cream Apply 1 application topically 2 (two) times daily. 30 g 0  . colestipol (COLESTID) 5 G granules Take 5 g by mouth daily.    . fexofenadine (ALLEGRA) 180 MG tablet Take 180 mg by mouth daily as needed. For allergies    . finasteride (PROSCAR) 5 MG tablet Take 1 tablet (5 mg total) by mouth daily. 30 tablet 5  . fish oil-omega-3 fatty acids 1000 MG capsule Take 500 mg by mouth 2 (two) times daily.    . fluticasone (FLONASE) 50 MCG/ACT nasal spray Place 2 sprays into both nostrils  daily. (Patient taking differently: Place 1 spray into both nostrils daily as needed for allergies. ) 16 g 6  . meloxicam (MOBIC) 15 MG tablet Take 1 tablet (15 mg total) by mouth daily. 90 tablet 3  . Multiple Vitamin (MULTIVITAMIN) tablet Take 1 tablet by mouth daily.     Marland Kitchen olmesartan (BENICAR) 20 MG tablet Take 1 tablet (20 mg total) by mouth daily. 90 tablet 3  . Olopatadine HCl (PATADAY) 0.2 % SOLN INSTILL ONE DROP INTO EACH EYE ONCE DAILY AS NEEDED FOR ITCH 2.5 mL 5  . oxyCODONE-acetaminophen (PERCOCET/ROXICET) 5-325 MG tablet Take 1 tablet by mouth every 8 (eight) hours as needed for severe pain. 12 tablet 0  . pantoprazole (PROTONIX) 40 MG tablet Take 1 tablet (40 mg total) by mouth every morning. 30 tablet 11  . pioglitazone (ACTOS) 15 MG  tablet TAKE ONE TABLET BY MOUTH EVERY MORNING 90 tablet 0  . Polyethyl Glycol-Propyl Glycol 0.4-0.3 % SOLN Apply 2 drops to eye 2 (two) times daily. 30 mL 1  . Probiotic Product (ALIGN PO) Take by mouth.    . rosuvastatin (CRESTOR) 10 MG tablet Take 1 tablet (10 mg total) by mouth daily. 90 tablet 4  . empagliflozin (JARDIANCE) 25 MG TABS tablet Take 25 mg by mouth daily. (Patient not taking: Reported on 05/16/2016) 30 tablet 5   No current facility-administered medications on file prior to visit.    Allergies  Allergen Reactions  . Doxazosin Mesylate     Reaction=unknown  . Doxazosin Mesylate Other (See Comments)  . Flomax [Tamsulosin Hcl]     Itching   . Other Other (See Comments)  . Penicillins Rash and Other (See Comments)    Has patient had a PCN reaction causing immediate rash, facial/tongue/throat swelling, SOB or lightheadedness with hypotension: No Has patient had a PCN reaction causing severe rash involving mucus membranes or skin necrosis: No Has patient had a PCN reaction that required hospitalization No Has patient had a PCN reaction occurring within the last 10 years: No If all of the above answers are "NO", then may proceed with Cephalosporin use.   . Sulfonamide Derivatives Rash   Social History   Social History  . Marital status: Married    Spouse name: N/A  . Number of children: 1  . 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 Smoking status: Former Smoker    Quit date: 08/30/1983  . Smokeless tobacco: Former Systems developer  . Alcohol use Yes     Comment: rare  . Drug use: No  . Sexual activity: Yes   Other Topics Concern  . Not on file   Social History Narrative  . No narrative on file      Review of Systems  All other systems reviewed and are negative.      Objective:   Physical Exam  Cardiovascular: Normal rate, regular rhythm and normal heart sounds.   Pulmonary/Chest: Effort normal and breath sounds  normal.  Skin: Rash noted. There is erythema.  Vitals reviewed.         Assessment & Plan:  Candida Balanitis Discontinue Jardiance. Replace with Trulicity 1.5 mg weekly. Diflucan 150 mg by mouth 1. Lotrimin applied twice daily for 2 weeks to the head of the penis. I believe the itching all over his body is more likely anxiety. He is not taking Celexa. I recommended he resume that. There is no rash. I see no signs  or symptoms of scabies.

## 2016-05-23 ENCOUNTER — Ambulatory Visit: Payer: Medicare HMO | Admitting: Family Medicine

## 2016-05-23 DIAGNOSIS — E1100 Type 2 diabetes mellitus with hyperosmolarity without nonketotic hyperglycemic-hyperosmolar coma (NKHHC): Secondary | ICD-10-CM

## 2016-05-23 NOTE — Progress Notes (Signed)
Pt new to Trulicity use for his Diabetes.  He brings his first Trulicity pen in today for instructions on use.  Pt was shown device and how it works.  Then explained procedure for administration.  Then had pt to administer first injection under my guidance.  Pt did very well.  Had questions about possible side effects.  Went over side effects with patient and gave patient instructional sheet  on how to give injection and also gave him written literature for Trulicity.  He felt very comfortable with use.  Told him to call if any questions or concerns with next injection.

## 2016-05-30 ENCOUNTER — Telehealth: Payer: Self-pay | Admitting: Family Medicine

## 2016-05-30 NOTE — Telephone Encounter (Signed)
Pt called on 05/28/16 LMOVM stating that he can not take the Trulicity as it is causing him to be light headed, N&V, Dizzy and decrease in appetite. He states that he is not taking the medication any longer. MD please advise.

## 2016-05-31 NOTE — Telephone Encounter (Signed)
Please remove trulicity and jardiance from med list.  Try glipizide xr 5 mg poqam.

## 2016-06-01 MED ORDER — GLIPIZIDE ER 5 MG PO TB24
5.0000 mg | ORAL_TABLET | Freq: Every day | ORAL | 5 refills | Status: DC
Start: 2016-06-01 — End: 2017-04-13

## 2016-06-01 NOTE — Telephone Encounter (Signed)
Patient's wife aware of providers recommendations. Med called to pharm and dc medications removed from list.

## 2016-06-14 ENCOUNTER — Encounter: Payer: Self-pay | Admitting: Family Medicine

## 2016-06-14 ENCOUNTER — Ambulatory Visit (INDEPENDENT_AMBULATORY_CARE_PROVIDER_SITE_OTHER): Payer: Medicare HMO | Admitting: Family Medicine

## 2016-06-14 VITALS — BP 112/68 | HR 74 | Temp 98.1°F | Resp 18 | Ht 71.0 in | Wt 251.0 lb

## 2016-06-14 DIAGNOSIS — N4829 Other inflammatory disorders of penis: Secondary | ICD-10-CM

## 2016-06-14 DIAGNOSIS — Z23 Encounter for immunization: Secondary | ICD-10-CM

## 2016-06-14 DIAGNOSIS — R300 Dysuria: Secondary | ICD-10-CM

## 2016-06-14 MED ORDER — FLUCONAZOLE 150 MG PO TABS: 150.0000 mg | ORAL_TABLET | Freq: Once | ORAL | 0 refills | Status: AC

## 2016-06-14 NOTE — Progress Notes (Signed)
Subjective:    Patient ID: Cameron Daniel, male    DOB: 1946/07/07, 70 y.o.   MRN: [id number]  HPI  02/2016 Patient is currently on Jardiance.  Approximately 1 week ago, he developed a white cheesy-like rash on the head of his penis with exudative discharge up underneath his foreskin consistent with candida balanitis. He also reports dysuria but his urinalysis today is clear. Examination of the head of the penis reveals an erythematous glans with white cottage cheeselike exudate underneath the foreskin at the proximal base of the glans.  At that time, my plan was: Temporarily discontinue jardiance.  Begin Diflucan 150 mg by mouth 1 now. Lotrimin cream applied 3 times a day to the head of the penis for 2 weeks. Resume diabetes medication once rash has resolved. We discussed strategies and techniques to prevent this in the future  05/16/16 Patient Is having another reaction to Jardiance. He has erythematous glans penis. He has white cheesy like discharge underneath his foreskin. He is itching all over his body although there is no visible rash. He wants to discontinue the medication.  At that time, my plan was: Candida Balanitis Discontinue Jardiance. Replace with Trulicity 1.5 mg weekly. Diflucan 150 mg by mouth 1. Lotrimin applied twice daily for 2 weeks to the head of the penis. I believe the itching all over his body is more likely anxiety. He is not taking Celexa. I recommended he resume that. There is no rash. I see no signs or symptoms of scabies.  06/14/16 Patient walks in this afternoon stating that he has a recurrence of his use infection on the head of his penis. Patient is uncircumcised. There is no external rash on his foreskin. Examination of the head of the penis under the foreskin reveals no erythema or discharge or rash. There is slight erythema at the tip of the prepuce but nothing significant.  This is where the patient reports some itching. Also reports urinary frequency and  symptoms consistent with overactive bladder. He is constantly voiding throughout the day. Therefore I believe this area remains wet and irritated consistently throughout the day Past Medical History:  Diagnosis Date  . Allergic rhinitis, cause unspecified   . Coronary atherosclerosis of unspecified type of vessel, native or graft    1985 , arthroplasty x 1   . Depression    PTSD   . Diverticulosis of colon (without mention of hemorrhage)   . Esophageal reflux   . Esophageal stricture   . Flushing   . Hemorrhoids   . Irritable bowel syndrome   . Myocardial infarction   . Nocturia   . Obesity, unspecified   . Other acquired absence of organ   . Other and unspecified hyperlipidemia   . Other malaise and fatigue   . Personal history of colonic polyps   . Psychosexual dysfunction with inhibited sexual excitement   . Type II or unspecified type diabetes mellitus without mention of complication, not stated as uncontrolled   . Unspecified essential hypertension    Past Surgical History:  Procedure Laterality Date  . CARPAL TUNNEL RELEASE     left   . CHOLECYSTECTOMY    . COLON RESECTION     18 inches  . COLON SURGERY  2013  . CORONARY ANGIOPLASTY     1985   . ELBOW BURSA SURGERY    . SHOULDER SURGERY     Rt. Shoulder   Current Outpatient Prescriptions on File Prior to Visit  Medication Sig Dispense Refill  .  aspirin 81 MG tablet Take 81 mg by mouth every morning.     Marland Kitchen BENICAR 20 MG tablet TAKE 1 TABLET (20 MG TOTAL) BY MOUTH DAILY. 90 tablet 3  . cholecalciferol (VITAMIN D) 1000 UNITS tablet Take 2,000 Units by mouth daily.    . citalopram (CELEXA) 10 MG tablet Take 20 mg by mouth daily.     . clotrimazole (LOTRIMIN AF) 1 % cream Apply 1 application topically 2 (two) times daily. 30 g 0  . clotrimazole (LOTRIMIN) 1 % cream Apply 1 application topically 2 (two) times daily. 30 g 0  . colestipol (COLESTID) 5 G granules Take 5 g by mouth daily.    . fexofenadine (ALLEGRA) 180 MG  tablet Take 180 mg by mouth daily as needed. For allergies    . finasteride (PROSCAR) 5 MG tablet Take 1 tablet (5 mg total) by mouth daily. 30 tablet 5  . fish oil-omega-3 fatty acids 1000 MG capsule Take 500 mg by mouth 2 (two) times daily.    . fluticasone (FLONASE) 50 MCG/ACT nasal spray Place 2 sprays into both nostrils daily. (Patient taking differently: Place 1 spray into both nostrils daily as needed for allergies. ) 16 g 6  . glipiZIDE (GLUCOTROL XL) 5 MG 24 hr tablet Take 1 tablet (5 mg total) by mouth daily with breakfast. 30 tablet 5  . meloxicam (MOBIC) 15 MG tablet Take 1 tablet (15 mg total) by mouth daily. 90 tablet 3  . Multiple Vitamin (MULTIVITAMIN) tablet Take 1 tablet by mouth daily.     Marland Kitchen olmesartan (BENICAR) 20 MG tablet Take 1 tablet (20 mg total) by mouth daily. 90 tablet 3  . Olopatadine HCl (PATADAY) 0.2 % SOLN INSTILL ONE DROP INTO EACH EYE ONCE DAILY AS NEEDED FOR ITCH 2.5 mL 5  . oxyCODONE-acetaminophen (PERCOCET/ROXICET) 5-325 MG tablet Take 1 tablet by mouth every 8 (eight) hours as needed for severe pain. 12 tablet 0  . pantoprazole (PROTONIX) 40 MG tablet Take 1 tablet (40 mg total) by mouth every morning. 30 tablet 11  . pioglitazone (ACTOS) 15 MG tablet TAKE ONE TABLET BY MOUTH EVERY MORNING 90 tablet 0  . Polyethyl Glycol-Propyl Glycol 0.4-0.3 % SOLN Apply 2 drops to eye 2 (two) times daily. 30 mL 1  . Probiotic Product (ALIGN PO) Take by mouth.    . rosuvastatin (CRESTOR) 10 MG tablet Take 1 tablet (10 mg total) by mouth daily. 90 tablet 4   No current facility-administered medications on file prior to visit.    Allergies  Allergen Reactions  . Doxazosin Mesylate     Reaction=unknown  . Doxazosin Mesylate Other (See Comments)  . Other Other (See Comments)  . Flomax [Tamsulosin Hcl]     Itching   . Jardiance [Empagliflozin] Other (See Comments)    Nausea, dizziness - Severe (per pt)  . Penicillins Rash and Other (See Comments)    Has patient had a  PCN reaction causing immediate rash, facial/tongue/throat swelling, SOB or lightheadedness with hypotension: No Has patient had a PCN reaction causing severe rash involving mucus membranes or skin necrosis: No Has patient had a PCN reaction that required hospitalization No Has patient had a PCN reaction occurring within the last 10 years: No If all of the above answers are "NO", then may proceed with Cephalosporin use.   . Sulfonamide Derivatives Rash  . Trulicity [Dulaglutide] Nausea And Vomiting    N&V, Dizziness   Social History   Social History  . Marital status: Married  Spouse name: N/A  . Number of children: 1  . 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 Smoking status: Former Smoker    Quit date: 08/30/1983  . Smokeless tobacco: Former Systems developer  . Alcohol use Yes     Comment: rare  . Drug use: No  . Sexual activity: Yes   Other Topics Concern  . Not on file   Social History Narrative  . No narrative on file      Review of Systems  All other systems reviewed and are negative.      Objective:   Physical Exam  Cardiovascular: Normal rate, regular rhythm and normal heart sounds.   Pulmonary/Chest: Effort normal and breath sounds normal.  Skin: Rash noted. There is erythema.  Vitals reviewed.         Assessment & Plan:  Patient may have a mild candidal infection coupled with an irritant dermatitis similar to diaper rash in infants due to constant moisture and irritation at the tip of the foreskin. Symptoms have responded in the past to Lotrimin cream area this time I'll treat the patient with Diflucan 150 mg by mouth 1 and then followed that up with Lotrimin cream twice daily for 2 weeks. If patient continues to have recurrent itching and irritation in this area, he may want to consider circumcision

## 2016-06-15 LAB — URINALYSIS, ROUTINE W REFLEX MICROSCOPIC
Bilirubin Urine: NEGATIVE
GLUCOSE, UA: NEGATIVE
HGB URINE DIPSTICK: NEGATIVE
Ketones, ur: NEGATIVE
LEUKOCYTES UA: NEGATIVE
NITRITE: NEGATIVE
PROTEIN: NEGATIVE
Specific Gravity, Urine: 1.023 (ref 1.001–1.035)
pH: 5 (ref 5.0–8.0)

## 2016-06-16 ENCOUNTER — Ambulatory Visit: Payer: Medicare HMO | Admitting: Family Medicine

## 2016-06-16 ENCOUNTER — Telehealth: Payer: Self-pay | Admitting: Family Medicine

## 2016-06-16 MED ORDER — PIOGLITAZONE HCL 15 MG PO TABS: ORAL_TABLET | ORAL | 1 refills | Status: DC

## 2016-06-16 NOTE — Telephone Encounter (Signed)
Patient is calling about getting his actos sent to [place] if possible  [phone]

## 2016-06-16 NOTE — Telephone Encounter (Signed)
Medication called/sent to requested pharmacy  

## 2016-06-30 ENCOUNTER — Encounter: Payer: Self-pay | Admitting: Cardiology

## 2016-07-01 ENCOUNTER — Encounter: Payer: Self-pay | Admitting: Cardiology

## 2016-07-01 ENCOUNTER — Ambulatory Visit (INDEPENDENT_AMBULATORY_CARE_PROVIDER_SITE_OTHER): Payer: Medicare HMO | Admitting: Cardiology

## 2016-07-01 ENCOUNTER — Ambulatory Visit: Payer: No Typology Code available for payment source | Admitting: Cardiology

## 2016-07-01 VITALS — BP 124/60 | HR 75 | Ht 71.0 in | Wt 253.0 lb

## 2016-07-01 DIAGNOSIS — I2510 Atherosclerotic heart disease of native coronary artery without angina pectoris: Secondary | ICD-10-CM

## 2016-07-01 DIAGNOSIS — I10 Essential (primary) hypertension: Secondary | ICD-10-CM

## 2016-07-01 DIAGNOSIS — E782 Mixed hyperlipidemia: Secondary | ICD-10-CM | POA: Diagnosis not present

## 2016-07-01 DIAGNOSIS — E784 Other hyperlipidemia: Secondary | ICD-10-CM | POA: Diagnosis not present

## 2016-07-01 DIAGNOSIS — Z789 Other specified health status: Secondary | ICD-10-CM

## 2016-07-01 DIAGNOSIS — E7849 Other hyperlipidemia: Secondary | ICD-10-CM

## 2016-07-01 NOTE — Patient Instructions (Signed)

## 2016-07-01 NOTE — Progress Notes (Signed)
Patient ID: JOREL NARDONE, male   DOB: 1946/08/24, 70 y.o.   MRN: [id number]    [name] Date of Encounter: 07/01/2016  Primary Care Provider:  Odette Fraction, MD Primary Cardiologist:  [name], MD   Patient Profile Re-establish cardiology care  Problem List   Past Medical History:  Diagnosis Date  . Allergic rhinitis, cause unspecified   . Coronary atherosclerosis of unspecified type of vessel, native or graft    1985 , arthroplasty x 1   . Depression    PTSD   . Diverticulosis of colon (without mention of hemorrhage)   . Esophageal reflux   . Esophageal stricture   . Flushing   . Hemorrhoids   . Irritable bowel syndrome   . Myocardial infarction   . Nocturia   . Obesity, unspecified   . Other acquired absence of organ   . Other and unspecified hyperlipidemia   . Other malaise and fatigue   . Personal history of colonic polyps   . Psychosexual dysfunction with inhibited sexual excitement   . Type II or unspecified type diabetes mellitus without mention of complication, not stated as uncontrolled   . Unspecified essential hypertension    Past Surgical History:  Procedure Laterality Date  . CARPAL TUNNEL RELEASE     left   . CHOLECYSTECTOMY    . COLON RESECTION     18 inches  . COLON SURGERY  2013  . CORONARY ANGIOPLASTY     1985   . ELBOW BURSA SURGERY    . SHOULDER SURGERY     Rt. Shoulder   Allergies  Allergies  Allergen Reactions  . Doxazosin Mesylate     Reaction=unknown  . Doxazosin Mesylate Other (See Comments)  . Other Other (See Comments)  . Flomax [Tamsulosin Hcl]     Itching   . Jardiance [Empagliflozin] Other (See Comments)    Nausea, dizziness - Severe (per pt)  . Penicillins Rash and Other (See Comments)    Has patient had a PCN reaction causing immediate rash, facial/tongue/throat swelling, SOB or lightheadedness with hypotension: No Has patient had a PCN reaction causing severe rash involving  mucus membranes or skin necrosis: No Has patient had a PCN reaction that required hospitalization No Has patient had a PCN reaction occurring within the last 10 years: No If all of the above answers are "NO", then may proceed with Cephalosporin use.   . Sulfonamide Derivatives Rash  . Trulicity [Dulaglutide] Nausea And Vomiting    N&V, Dizziness    HPI  70 year old male with h/o MI in 1985, HTN, hyperlipidemia, NIDDM, former smoking who is a former patient of [name]. He was lost for follow up since 2009 when he lost his insurance and was followed at [place]. He denies any CP, SOB, dizziness, palpitations, syncope. He had a nuclear stress test 3 years ago that was unchanged from prior (per patient) and no intervention was done. 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   07/01/2016 - 1 year follow up, He feels great he continues to work as a Development worker, international aid, and denies any chest pain or shortness of breath, he has balance issue that he attributes to his diabetic neuropathy. He has been having hard time with diabetic medications as he develop recurrent yeast infection. Most recent hemoglobin A1c 6.9%. July 2017 HDL and LDL at goal however elevated triglycerides. In the past he was intolerant to Lipitor but is tolerating Crestor very well.  He denies any lower extremity edema no orthopnea proximal nocturnal dyspnea.   Home Medications  Prior to Admission medications   Medication Sig Start Date End Date Taking? Authorizing Provider  aspirin 81 MG tablet Take 81 mg by mouth every morning.    Yes Historical Provider, MD  cholecalciferol (VITAMIN D) 1000 UNITS tablet Take 2,000 Units by mouth daily.   Yes Historical Provider, MD  citalopram (CELEXA) 10 MG tablet Take 20 mg by mouth daily.    Yes Historical Provider, MD  colestipol (COLESTID) 5 G granules Take 5 g by mouth daily.   Yes Historical Provider, MD  fexofenadine (ALLEGRA) 180 MG tablet Take 180 mg by mouth daily  as needed. For allergies   Yes Historical Provider, MD  fish oil-omega-3 fatty acids 1000 MG capsule Take 500 mg by mouth 2 (two) times daily.   Yes Historical Provider, MD  meloxicam (MOBIC) 15 MG tablet Take 1 tablet (15 mg total) by mouth daily. 04/25/13  Yes [name], MD  montelukast (SINGULAIR) 10 MG tablet Take 1 tablet (10 mg total) by mouth at bedtime. 12/20/12  Yes [name], MD  Multiple Vitamin (MULTIVITAMIN) tablet Take 1 tablet by mouth daily.    Yes Historical Provider, MD  olmesartan (BENICAR) 20 MG tablet Take 20 mg by mouth every morning.   Yes Historical Provider, MD  pantoprazole (PROTONIX) 40 MG tablet Take 40 mg by mouth every morning.    Yes Historical Provider, MD  PATADAY 0.2 % SOLN INSTILL ONE DROP INTO EACH EYE DAILY AS NEEDED FOR ITCH. 01/06/13  Yes [name], MD  pioglitazone (ACTOS) 15 MG tablet Take 15 mg by mouth every morning.    Yes Historical Provider, MD  polyethylene glycol powder (GLYCOLAX/MIRALAX) powder Take 17 g by mouth daily. 03/12/13  Yes Jasper Riling. Pickering, MD  simvastatin (ZOCOR) 20 MG tablet Take 30 mg by mouth every evening.   Yes Historical Provider, MD  Testosterone (ANDROGEL) 20.25 MG/1.25GM (1.62%) GEL Place onto the skin daily. 2 pumps daily   Yes Historical Provider, MD  triamcinolone (NASACORT) 55 MCG/ACT nasal inhaler Place 2 sprays into the nose daily.   Yes Historical Provider, MD    Family History  Family History  Problem Relation Age of Onset  . Lung cancer Father   . Coronary artery disease Mother   . Colon cancer Neg Hx   . Heart attack Neg Hx   . Hypertension Neg Hx   . Stroke Neg Hx     Social History  Social History   Social History  . Marital status: Married    Spouse name: N/A  . Number of children: 1  . 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 Smoking status: Former Smoker    Quit date: 08/30/1983  . Smokeless tobacco: Former Systems developer  . Alcohol  use Yes     Comment: rare  . Drug use: No  . Sexual activity: Yes   Other Topics Concern  . Not on file   Social History Narrative  . No narrative on file     Review of Systems General:  No chills, fever, night sweats or weight changes.  Cardiovascular:  No chest pain, dyspnea on exertion, edema, orthopnea, palpitations, paroxysmal nocturnal dyspnea. Dermatological: No rash, lesions/masses Respiratory: No cough, dyspnea Urologic: No hematuria, dysuria Abdominal:   No nausea, vomiting, diarrhea, bright red blood per rectum, melena, or hematemesis Neurologic:  No visual changes, wkns, changes in mental status. All other systems reviewed and are otherwise negative except as noted above.  Physical Exam  There were no vitals taken for this visit.  General: Pleasant, NAD Psych: Normal affect. Neuro: Alert and oriented X 3. Moves all extremities spontaneously. HEENT: Normal  Neck: Supple without bruits or JVD. Lungs:  Resp regular and unlabored, CTA. Heart: RRR no s3, s4, or murmurs. Abdomen: Soft, non-tender, non-distended, BS + x 4.  Extremities: No clubbing, cyanosis or edema. DP/PT/Radials 2+ and equal bilaterally.  Accessory Clinical Findings  ECG - SR, 70/min, LAD,PR 123ms, QRS 52ms, QT 398 ms, prior inferior MI, no ST - T wave abnormalities, unchanged from 2014    Assessment & Plan  70 year old male who was followed in our clinic for years by Dr Lia Foyer and came back for re-establishing cardiology care  1. CAD - currently asymptomatic, we will continue with Aspirin, BB, ARB, statin, no ischemic workup needed.  2. Hypertension - controlled, continue same management.  3. Hyperlipidemia -previously very elevated TAG, very good LDL and HDL 65. We started Crestor, added fish oil with good control of LDL and HDL and mildly elevated triglycerides but significantly elevated from prior.  4. NIDDM - elevated HbA1c - the patient will follow with PCP  Follow up in 1 year.     [name], MD 07/01/2016, 8:47 AM

## 2016-08-25 ENCOUNTER — Ambulatory Visit
Admission: RE | Admit: 2016-08-25 | Discharge: 2016-08-25 | Disposition: A | Discharge status: Home / Self Care | Payer: Medicare HMO | Source: Ambulatory Visit | Attending: Family Medicine | Admitting: Family Medicine

## 2016-08-25 ENCOUNTER — Ambulatory Visit (INDEPENDENT_AMBULATORY_CARE_PROVIDER_SITE_OTHER): Payer: Medicare HMO | Admitting: Family Medicine

## 2016-08-25 VITALS — BP 120/62 | HR 84 | Temp 100.6°F | Resp 18 | Ht 71.0 in | Wt 257.0 lb

## 2016-08-25 DIAGNOSIS — J111 Influenza due to unidentified influenza virus with other respiratory manifestations: Secondary | ICD-10-CM | POA: Diagnosis not present

## 2016-08-25 DIAGNOSIS — R05 Cough: Secondary | ICD-10-CM | POA: Diagnosis not present

## 2016-08-25 DIAGNOSIS — R509 Fever, unspecified: Secondary | ICD-10-CM

## 2016-08-25 MED ORDER — OLMESARTAN MEDOXOMIL 20 MG PO TABS: 20.0000 mg | ORAL_TABLET | Freq: Every day | ORAL | 3 refills | Status: DC

## 2016-08-25 MED ORDER — FINASTERIDE 5 MG PO TABS: 5.0000 mg | ORAL_TABLET | Freq: Every day | ORAL | 3 refills | Status: DC

## 2016-08-25 MED ORDER — OSELTAMIVIR PHOSPHATE 75 MG PO CAPS: 75.0000 mg | ORAL_CAPSULE | Freq: Two times a day (BID) | ORAL | 0 refills | Status: DC

## 2016-08-25 NOTE — Progress Notes (Signed)
Subjective:    Patient ID: Cameron Daniel, male    DOB: October 21, 1945, 70 y.o.   MRN: [id number]  HPI Patient reports the sudden onset of fever and shaking chills/rigors beginning last night. Fever today is 100.6. He reports cough that is nonproductive. He reports chest congestion and wheezing. He has severe diffuse body aches. Symptoms are consistent with influenza. Examination is significant for mild expiratory wheezes and rhonchorous breath sounds. I cannot exclude a hidden pneumonia. Past Medical History:  Diagnosis Date  . Allergic rhinitis, cause unspecified   . Coronary atherosclerosis of unspecified type of vessel, native or graft    1985 , arthroplasty x 1   . Depression    PTSD   . Diverticulosis of colon (without mention of hemorrhage)   . Esophageal reflux   . Esophageal stricture   . Flushing   . Hemorrhoids   . Irritable bowel syndrome   . Myocardial infarction   . Nocturia   . Obesity, unspecified   . Other acquired absence of organ   . Other and unspecified hyperlipidemia   . Other malaise and fatigue   . Personal history of colonic polyps   . Psychosexual dysfunction with inhibited sexual excitement   . Type II or unspecified type diabetes mellitus without mention of complication, not stated as uncontrolled   . Unspecified essential hypertension    Past Surgical History:  Procedure Laterality Date  . CARPAL TUNNEL RELEASE     left   . CHOLECYSTECTOMY    . COLON RESECTION     18 inches  . COLON SURGERY  2013  . CORONARY ANGIOPLASTY     1985   . ELBOW BURSA SURGERY    . SHOULDER SURGERY     Rt. Shoulder   Current Outpatient Prescriptions on File Prior to Visit  Medication Sig Dispense Refill  . aspirin 81 MG tablet Take 81 mg by mouth every morning.     . citalopram (CELEXA) 10 MG tablet Take 20 mg by mouth daily.     . fexofenadine (ALLEGRA) 180 MG tablet Take 180 mg by mouth daily as needed. For allergies    . glipiZIDE (GLUCOTROL XL) 5 MG 24 hr  tablet Take 1 tablet (5 mg total) by mouth daily with breakfast. 30 tablet 5  . pantoprazole (PROTONIX) 40 MG tablet Take 1 tablet (40 mg total) by mouth every morning. 30 tablet 11  . pioglitazone (ACTOS) 15 MG tablet TAKE ONE TABLET BY MOUTH EVERY MORNING 90 tablet 1  . rosuvastatin (CRESTOR) 10 MG tablet Take 1 tablet (10 mg total) by mouth daily. 90 tablet 4   No current facility-administered medications on file prior to visit.    Allergies  Allergen Reactions  . Doxazosin Mesylate     Reaction=unknown  . Doxazosin Mesylate Other (See Comments)  . Other Other (See Comments)  . Flomax [Tamsulosin Hcl]     Itching   . Jardiance [Empagliflozin] Other (See Comments)    Nausea, dizziness - Severe (per pt)  . Penicillins Rash and Other (See Comments)    Has patient had a PCN reaction causing immediate rash, facial/tongue/throat swelling, SOB or lightheadedness with hypotension: No Has patient had a PCN reaction causing severe rash involving mucus membranes or skin necrosis: No Has patient had a PCN reaction that required hospitalization No Has patient had a PCN reaction occurring within the last 10 years: No If all of the above answers are "NO", then may proceed with Cephalosporin use.   Marland Kitchen  Sulfonamide Derivatives Rash  . Trulicity [Dulaglutide] Nausea And Vomiting    N&V, Dizziness   Social History   Social History  . Marital status: Married    Spouse name: N/A  . Number of children: 1  . 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 Smoking status: Former Smoker    Quit date: 08/30/1983  . Smokeless tobacco: Former Systems developer  . Alcohol use Yes     Comment: rare  . Drug use: No  . Sexual activity: Yes   Other Topics Concern  . Not on file   Social History Narrative  . No narrative on file       Review of Systems  All other systems reviewed and are negative.      Objective:   Physical Exam  Constitutional: He appears  well-developed and well-nourished. No distress.  HENT:  Right Ear: External ear normal.  Left Ear: External ear normal.  Nose: Nose normal.  Mouth/Throat: Oropharynx is clear and moist. No oropharyngeal exudate.  Eyes: Conjunctivae are normal.  Neck: Neck supple.  Cardiovascular: Normal rate, regular rhythm and normal heart sounds.   Pulmonary/Chest: Effort normal. No respiratory distress. He has wheezes. He has rhonchi.  Lymphadenopathy:    He has no cervical adenopathy.  Skin: He is not diaphoretic.          Assessment & Plan:  Fever, unspecified fever cause - Plan: Influenza A and B Ag, Immunoassay, DG Chest 2 View  Influenza with respiratory manifestation - Plan: oseltamivir (TAMIFLU) 75 MG capsule I believe the patient has influenza based on his history and appearance. However I'm also concerned about possible pneumonia based on his pulmonary exam. Therefore I will send the patient for a chest x-ray. I will treat the patient with Tamiflu 75 mg by mouth twice a day for 5 days. He can use Mucinex for chest congestion. Plan may change if the x-ray shows pneumonia.

## 2016-08-30 ENCOUNTER — Telehealth: Payer: Self-pay | Admitting: Family Medicine

## 2016-08-30 NOTE — Telephone Encounter (Signed)
Patient aware of providers recommendations.  

## 2016-08-30 NOTE — Telephone Encounter (Signed)
Called and spoke to pt and he is NOT coughing up blood but blowing blood out of his nose with large clots and his mucus is really thick plus he has ran a fever especially at night as he will wake up wet with sweat. The blood is dark brown with yellow mucus. He is using mucinex, vaseline in nose and saline spray and drinking lots of water. He is wondering if he needs an antibx as he thinks he has a sinus infection on top of the Flu.?

## 2016-08-30 NOTE — Telephone Encounter (Signed)
Patient is calling to say he is worse is coughing up blood and mucus would like a call back asap

## 2016-08-30 NOTE — Telephone Encounter (Signed)
Blood does not mean a sinus infection but is likely due to blowing his nose so much.  Keep doing what he is doing and he should be better by the end of the week.

## 2016-09-05 ENCOUNTER — Ambulatory Visit: Payer: No Typology Code available for payment source | Admitting: Cardiology

## 2016-09-09 ENCOUNTER — Telehealth: Payer: Self-pay

## 2016-09-09 NOTE — Telephone Encounter (Signed)
PT called and stated he was sweating really bad at night having to change sheets. Pt states his b/s reading was normal his b/p was normal no wheezing, no coughing, drainage is clear. Pt states he has frequent urination, no fever and has been having this problem for 3-4 nights now. Explained to pt things he could try at hme to help with the excessive sweats and if they did not help for him to go to an urgent care to get checked out. Pt recently had the flu. Pt understands

## 2016-10-14 LAB — INFLUENZA A AND B AG, IMMUNOASSAY
Influenza A Antigen: NOT DETECTED
Influenza B Antigen: NOT DETECTED

## 2016-10-21 ENCOUNTER — Encounter: Payer: Self-pay | Admitting: Internal Medicine

## 2016-10-30 ENCOUNTER — Other Ambulatory Visit: Payer: Self-pay | Admitting: Family Medicine

## 2016-11-08 ENCOUNTER — Other Ambulatory Visit: Payer: Self-pay | Admitting: Family Medicine

## 2016-11-18 ENCOUNTER — Encounter: Payer: Medicare HMO | Admitting: Family Medicine

## 2016-12-05 ENCOUNTER — Ambulatory Visit (INDEPENDENT_AMBULATORY_CARE_PROVIDER_SITE_OTHER): Payer: Medicare HMO | Admitting: Physician Assistant

## 2016-12-05 ENCOUNTER — Encounter: Payer: Self-pay | Admitting: Physician Assistant

## 2016-12-05 VITALS — BP 124/78 | HR 80 | Temp 98.0°F | Resp 18 | Wt 256.0 lb

## 2016-12-05 DIAGNOSIS — J0100 Acute maxillary sinusitis, unspecified: Secondary | ICD-10-CM

## 2016-12-05 MED ORDER — OLOPATADINE HCL 0.2 % OP SOLN
1.0000 [drp] | Freq: Every day | OPHTHALMIC | 5 refills | Status: DC | PRN
Start: 2016-12-05 — End: 2017-09-18

## 2016-12-05 MED ORDER — AZITHROMYCIN 250 MG PO TABS
ORAL_TABLET | ORAL | 0 refills | Status: DC
Start: 2016-12-05 — End: 2016-12-08

## 2016-12-05 NOTE — Progress Notes (Signed)
Patient ID: Cameron Daniel MRN: [id number], DOB: June 24, 1946, 71 y.o. Date of Encounter: 12/05/2016, 9:32 AM    Chief Complaint:  Chief Complaint  Patient presents with  . Sinusitis    x2days      HPI: 71 y.o. year old male says he always has "real bad allergies ". Says that he has been using Allegra and Flonase every day recently. Got worse about 2 weeks ago. In addition to the Allegra and Flonase has also used a whole pack of Mucinex sinus max. He recently was around some mowed grass. After that his eyes got really itchy. Now is having pain in the right maxillary sinus region. Says that it is causing pain down towards his teeth even though he doesn't even have teeth wears false teeth. Also a lot of pressure and throbbing in that right maxillary sinus. Says that he is now getting out thick dark yellow mucus. Says that in the past with his allergies he has gotten prednisone injection and feels like that is what he needs again. Sugar has been well controlled and he just checked it this morning with a good reading.      Home Meds:   Outpatient Medications Prior to Visit  Medication Sig Dispense Refill  . aspirin 81 MG tablet Take 81 mg by mouth every morning.     . citalopram (CELEXA) 10 MG tablet Take 20 mg by mouth daily.     . fexofenadine (ALLEGRA) 180 MG tablet Take 180 mg by mouth daily as needed. For allergies    . finasteride (PROSCAR) 5 MG tablet Take 1 tablet (5 mg total) by mouth daily. 90 tablet 3  . glipiZIDE (GLUCOTROL XL) 5 MG 24 hr tablet Take 1 tablet (5 mg total) by mouth daily with breakfast. 30 tablet 5  . olmesartan (BENICAR) 20 MG tablet Take 1 tablet (20 mg total) by mouth daily. 90 tablet 3  . pantoprazole (PROTONIX) 40 MG tablet TAKE 1 TABLET (40 MG TOTAL) BY MOUTH EVERY MORNING. 90 tablet 3  . pioglitazone (ACTOS) 15 MG tablet TAKE ONE TABLET BY MOUTH EVERY MORNING 90 tablet 1  . rosuvastatin (CRESTOR) 10 MG tablet TAKE 1 TABLET (10 MG TOTAL) BY MOUTH DAILY.  90 tablet 3  . oseltamivir (TAMIFLU) 75 MG capsule Take 1 capsule (75 mg total) by mouth 2 (two) times daily. 10 capsule 0   No facility-administered medications prior to visit.     Allergies:  Allergies  Allergen Reactions  . Doxazosin Mesylate     Reaction=unknown  . Doxazosin Mesylate Other (See Comments)  . Other Other (See Comments)  . Flomax [Tamsulosin Hcl]     Itching   . Jardiance [Empagliflozin] Other (See Comments)    Nausea, dizziness - Severe (per pt)  . Penicillins Rash and Other (See Comments)    Has patient had a PCN reaction causing immediate rash, facial/tongue/throat swelling, SOB or lightheadedness with hypotension: No Has patient had a PCN reaction causing severe rash involving mucus membranes or skin necrosis: No Has patient had a PCN reaction that required hospitalization No Has patient had a PCN reaction occurring within the last 10 years: No If all of the above answers are "NO", then may proceed with Cephalosporin use.   . Sulfonamide Derivatives Rash  . Trulicity [Dulaglutide] Nausea And Vomiting    N&V, Dizziness      Review of Systems: See HPI for pertinent ROS. All other ROS negative.    Physical Exam: Blood pressure 124/78, pulse  80, temperature 98 F (36.7 C), temperature source Oral, resp. rate 18, weight 256 lb (116.1 kg), SpO2 98 %., Body mass index is 35.7 kg/m. General:  WM. Appears in no acute distress. HEENT: Normocephalic, atraumatic, eyes without discharge, sclera non-icteric, nares are without discharge. Bilateral auditory canals clear, TM's are without perforation, pearly grey and translucent with reflective cone of light bilaterally. Oral cavity moist, posterior pharynx without exudate, erythema, peritonsillar abscess. Right Maxillary sinus: Tender with percussion. No tenderness with percussion to left maxillary sinus or to the frontal sinuses bilaterally. Neck: Supple. No thyromegaly. No lymphadenopathy. Lungs: Clear bilaterally to  auscultation without wheezes, rales, or rhonchi. Breathing is unlabored. Heart: Regular rhythm. No murmurs, rubs, or gallops. Msk:  Strength and tone normal for age. Extremities/Skin: Warm and dry.  Neuro: Alert and oriented X 3. Moves all extremities spontaneously. Gait is normal. CNII-XII grossly in tact. Psych:  Responds to questions appropriately with a normal affect.     ASSESSMENT AND PLAN:  71 y.o. year old male with  1. Acute maxillary sinusitis, recurrence not specified Depo-Medrol 80 mg IM given here. Patient aware that this will increase blood sugar and to be extra careful with carbohydrates over the upcoming week. He is to take the azithromycin as directed. Continue the Allegra and Flonase and Pataday. Follow-up if thick dark mucus and sinus pressure do not resolve and if other symptoms are not controlled with this. - azithromycin (ZITHROMAX) 250 MG tablet; Day 1: Take 2 daily. Days 2-5: Take 1 daily.  Dispense: 6 tablet; Refill: 0   Signed, [address] [place], [place], [facility] 12/05/2016 9:32 AM

## 2016-12-06 ENCOUNTER — Other Ambulatory Visit: Payer: Self-pay | Admitting: Family Medicine

## 2016-12-06 MED ORDER — METHYLPREDNISOLONE ACETATE 80 MG/ML IJ SUSP
80.0000 mg | Freq: Once | INTRAMUSCULAR | Status: AC
  Administered 2016-12-05: 80 mg via INTRAMUSCULAR

## 2016-12-06 NOTE — Addendum Note (Signed)
Addended by: Vonna Kotyk A on: 12/06/2016 08:00 AM   Modules accepted: Orders

## 2016-12-08 ENCOUNTER — Encounter: Payer: Self-pay | Admitting: Family Medicine

## 2016-12-08 ENCOUNTER — Ambulatory Visit (INDEPENDENT_AMBULATORY_CARE_PROVIDER_SITE_OTHER): Payer: Medicare HMO | Admitting: Family Medicine

## 2016-12-08 VITALS — BP 138/60 | HR 80 | Temp 98.0°F | Resp 18 | Ht 71.0 in | Wt 256.0 lb

## 2016-12-08 DIAGNOSIS — Z23 Encounter for immunization: Secondary | ICD-10-CM

## 2016-12-08 DIAGNOSIS — Z125 Encounter for screening for malignant neoplasm of prostate: Secondary | ICD-10-CM | POA: Diagnosis not present

## 2016-12-08 DIAGNOSIS — E119 Type 2 diabetes mellitus without complications: Secondary | ICD-10-CM | POA: Diagnosis not present

## 2016-12-08 DIAGNOSIS — M79672 Pain in left foot: Secondary | ICD-10-CM | POA: Diagnosis not present

## 2016-12-08 DIAGNOSIS — E7800 Pure hypercholesterolemia, unspecified: Secondary | ICD-10-CM

## 2016-12-08 DIAGNOSIS — Z0000 Encounter for general adult medical examination without abnormal findings: Secondary | ICD-10-CM

## 2016-12-08 DIAGNOSIS — I2510 Atherosclerotic heart disease of native coronary artery without angina pectoris: Secondary | ICD-10-CM | POA: Diagnosis not present

## 2016-12-08 DIAGNOSIS — I10 Essential (primary) hypertension: Secondary | ICD-10-CM

## 2016-12-08 NOTE — Addendum Note (Signed)
Addended by: Jenna Luo T on: 12/08/2016 04:53 PM   Modules accepted: Level of Service

## 2016-12-08 NOTE — Progress Notes (Signed)
Subjective:    Patient ID: Cameron Daniel, male    DOB: [birth year], 71 y.o.   MRN: [id number]  HPI Patient is here today for complete physical exam. In addition to a complete physical exam, the patient has 1 medical concern. He reports pain in his left foot over the navicular bone. The pain began one week ago without provocation. The pain was severe. It radiated from that area down into his great toe. He denied any erythema or warmth or swelling. Although somewhat better, is still sore  Regarding his complete physical exam, his diabetic eye exam has been performed within the last 12 months. He is due for hepatitis C screening. Because of his age and his history of smoking he is due for AAA screening, however a CT scan of the abdomen and pelvis obtained in November 2017 revealed no evidence of a AAA. He has had Pneumovax 23. He's had a shingles vaccine. He is due for tetanus shot which he prefers to get at the [place] for cost. He is also due for Prevnar 13. Depression screen and fall risk performed and negative. Diabetic foot exam was performed and is normal although he does have neuropathic pain in his feet. He is also due for a recheck a hemoglobin A1c, fasting lipid panel, and PSA to screen for prostate cancer. His last colonoscopy was in 2013 and he is due again as he is on 37-year-old protocol. Because of his history of coronary artery disease, his goal LDL cholesterol is less than 70. His goal hemoglobin A1c is less than 6.5 Past Medical History:  Diagnosis Date  . Allergic rhinitis, cause unspecified   . Coronary atherosclerosis of unspecified type of vessel, native or graft    1985 , arthroplasty x 1   . Depression    PTSD   . Diverticulosis of colon (without mention of hemorrhage)   . Esophageal reflux   . Esophageal stricture   . Flushing   . Hemorrhoids   . Irritable bowel syndrome   . Myocardial infarction   . Nocturia   . Obesity, unspecified   . Other acquired absence of organ     . Other and unspecified hyperlipidemia   . Other malaise and fatigue   . Personal history of colonic polyps   . Psychosexual dysfunction with inhibited sexual excitement   . Type II or unspecified type diabetes mellitus without mention of complication, not stated as uncontrolled   . Unspecified essential hypertension    Past Surgical History:  Procedure Laterality Date  . CARPAL TUNNEL RELEASE     left   . CHOLECYSTECTOMY    . COLON RESECTION     18 inches  . COLON SURGERY  2013  . CORONARY ANGIOPLASTY     1985   . ELBOW BURSA SURGERY    . SHOULDER SURGERY     Rt. Shoulder   Current Outpatient Prescriptions on File Prior to Visit  Medication Sig Dispense Refill  . aspirin 81 MG tablet Take 81 mg by mouth every morning.     . Cholecalciferol (VITAMIN D3) 1000 units CAPS Take 1,000 Units by mouth daily.    . citalopram (CELEXA) 10 MG tablet Take 20 mg by mouth daily.     . colestipol (COLESTID) 5 g packet Take 5 g by mouth 2 (two) times daily.    . fexofenadine (ALLEGRA) 180 MG tablet Take 180 mg by mouth daily as needed. For allergies    . finasteride (PROSCAR) 5 MG tablet  Take 1 tablet (5 mg total) by mouth daily. 90 tablet 3  . fluticasone (FLONASE) 50 MCG/ACT nasal spray Place 1 spray into the nose daily.     Marland Kitchen glipiZIDE (GLUCOTROL XL) 5 MG 24 hr tablet Take 1 tablet (5 mg total) by mouth daily with breakfast. 30 tablet 5  . meloxicam (MOBIC) 15 MG tablet Take 15 mg by mouth daily.    . Multiple Vitamin (MULTI-VITAMINS) TABS Take 1 capsule by mouth daily.    Marland Kitchen olmesartan (BENICAR) 20 MG tablet Take 1 tablet (20 mg total) by mouth daily. 90 tablet 3  . Olopatadine HCl (PATADAY) 0.2 % SOLN Apply 1 drop to eye daily as needed. 5 mL 5  . pantoprazole (PROTONIX) 40 MG tablet TAKE 1 TABLET (40 MG TOTAL) BY MOUTH EVERY MORNING. 90 tablet 3  . pioglitazone (ACTOS) 15 MG tablet TAKE ONE TABLET BY MOUTH EVERY MORNING 90 tablet 1  . rosuvastatin (CRESTOR) 10 MG tablet TAKE 1 TABLET (10  MG TOTAL) BY MOUTH DAILY. 90 tablet 3   No current facility-administered medications on file prior to visit.    Allergies  Allergen Reactions  . Doxazosin Mesylate     Reaction=unknown  . Doxazosin Mesylate Other (See Comments)  . Other Other (See Comments)  . Flomax [Tamsulosin Hcl]     Itching   . Jardiance [Empagliflozin] Other (See Comments)    Nausea, dizziness - Severe (per pt)  . Penicillins Rash and Other (See Comments)    Has patient had a PCN reaction causing immediate rash, facial/tongue/throat swelling, SOB or lightheadedness with hypotension: No Has patient had a PCN reaction causing severe rash involving mucus membranes or skin necrosis: No Has patient had a PCN reaction that required hospitalization No Has patient had a PCN reaction occurring within the last 10 years: No If all of the above answers are "NO", then may proceed with Cephalosporin use.   . Sulfonamide Derivatives Rash  . Trulicity [Dulaglutide] Nausea And Vomiting    N&V, Dizziness   Social History   Social History  . Marital status: Married    Spouse name: N/A  . Number of children: 1  . 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 Smoking status: Former Smoker    Quit date: 08/30/1983  . Smokeless tobacco: Former Systems developer  . Alcohol use Yes     Comment: rare  . Drug use: No  . Sexual activity: Yes   Other Topics Concern  . Not on file   Social History Narrative  . No narrative on file   Family History  Problem Relation Age of Onset  . Lung cancer Father   . Coronary artery disease Mother   . Colon cancer Neg Hx   . Heart attack Neg Hx   . Hypertension Neg Hx   . Stroke Neg Hx       Review of Systems  All other systems reviewed and are negative.      Objective:   Physical Exam  Constitutional: He is oriented to person, place, and time. He appears well-developed and well-nourished. No distress.  HENT:  Head: Normocephalic and  atraumatic.  Right Ear: External ear normal.  Left Ear: External ear normal.  Nose: Nose normal.  Mouth/Throat: Oropharynx is clear and moist. No oropharyngeal exudate.  Eyes: Conjunctivae and EOM are normal. Pupils are equal, round, and reactive to light. Right eye exhibits no discharge. Left eye exhibits no discharge.  No scleral icterus.  Neck: Normal range of motion. Neck supple. No JVD present. No tracheal deviation present. No thyromegaly present.  Cardiovascular: Normal rate, regular rhythm, normal heart sounds and intact distal pulses.  Exam reveals no gallop and no friction rub.   No murmur heard. Pulmonary/Chest: Effort normal and breath sounds normal. No stridor. No respiratory distress. He has no wheezes. He has no rales. He exhibits no tenderness.  Abdominal: Soft. Bowel sounds are normal. He exhibits no distension and no mass. There is no tenderness. There is no rebound and no guarding.  Genitourinary: Rectum normal and prostate normal. Rectal exam shows no external hemorrhoid, no internal hemorrhoid, no fissure, no mass and no tenderness. Prostate is not enlarged and not tender.  Musculoskeletal: Normal range of motion. He exhibits no edema, tenderness or deformity.  Lymphadenopathy:    He has no cervical adenopathy.  Neurological: He is alert and oriented to person, place, and time. He has normal reflexes. He displays normal reflexes. No cranial nerve deficit. He exhibits normal muscle tone. Coordination normal.  Skin: Skin is warm. No rash noted. He is not diaphoretic. No erythema. No pallor.  Psychiatric: He has a normal mood and affect. His behavior is normal. Judgment and thought content normal.  Vitals reviewed.         Assessment & Plan:  Left foot pain - Plan: DG Foot Complete Left  Need for prophylactic vaccination against Streptococcus pneumoniae (pneumococcus) - Plan: Pneumococcal conjugate vaccine 13-valent IM  Screening PSA (prostate specific  antigen)  Controlled type 2 diabetes mellitus without complication, without long-term current use of insulin ([place])  Pure hypercholesterolemia  Benign essential HTN  ASCVD (arteriosclerotic cardiovascular disease)  General medical exam  The patient's blood pressure today is well controlled. I will have him return fasting for a CBC, CMP, fasting lipid panel, hemoglobin A1c. His goal LDL cholesterol is less than 70. His goal hemoglobin A1c is less than 6.5. I will also screen the patient for prostate cancer with a PSA. He is also due for hepatitis C screening. Diabetic foot exam is performed and is up-to-date. Diabetic eye exam is up-to-date. He is due for colonoscopy. He will contact his gastroenterologist to schedule this. He is also due for AAA screening however this is been completed earlier coincidentally on a CT scan of the abdomen and pelvis. Vaccines are up-to-date including the shingles vaccine.  He did receive Prevnar 13 today. He defers a tetanus shot until he goes to the [place]. I believe the pain in his left foot secondary to osteoarthritis. I will like the patient to go for an x-ray of the left foot to evaluate further. Gout would also be a possibility

## 2016-12-09 ENCOUNTER — Encounter: Payer: Medicare HMO | Admitting: Family Medicine

## 2016-12-12 ENCOUNTER — Ambulatory Visit
Admission: RE | Admit: 2016-12-12 | Discharge: 2016-12-12 | Disposition: A | Discharge status: Home / Self Care | Payer: Medicare HMO | Source: Ambulatory Visit | Attending: Family Medicine | Admitting: Family Medicine

## 2016-12-12 DIAGNOSIS — M79672 Pain in left foot: Secondary | ICD-10-CM

## 2016-12-12 DIAGNOSIS — M19072 Primary osteoarthritis, left ankle and foot: Secondary | ICD-10-CM | POA: Diagnosis not present

## 2016-12-14 ENCOUNTER — Other Ambulatory Visit: Payer: Self-pay | Admitting: Family Medicine

## 2016-12-14 MED ORDER — PREDNISONE 20 MG PO TABS: ORAL_TABLET | ORAL | 0 refills | Status: DC

## 2016-12-15 ENCOUNTER — Other Ambulatory Visit: Payer: Medicare HMO

## 2016-12-15 DIAGNOSIS — E785 Hyperlipidemia, unspecified: Secondary | ICD-10-CM | POA: Diagnosis not present

## 2016-12-15 DIAGNOSIS — E119 Type 2 diabetes mellitus without complications: Secondary | ICD-10-CM

## 2016-12-15 DIAGNOSIS — Z79899 Other long term (current) drug therapy: Secondary | ICD-10-CM | POA: Diagnosis not present

## 2016-12-15 DIAGNOSIS — I10 Essential (primary) hypertension: Secondary | ICD-10-CM | POA: Diagnosis not present

## 2016-12-15 LAB — CBC WITH DIFFERENTIAL/PLATELET
Basophils Absolute: 0 cells/uL (ref 0–200)
Basophils Relative: 0 %
EOS PCT: 2 %
Eosinophils Absolute: 160 cells/uL (ref 15–500)
HEMATOCRIT: 44.4 % (ref 38.5–50.0)
HEMOGLOBIN: 14.7 g/dL (ref 13.0–17.0)
LYMPHS ABS: 2800 {cells}/uL (ref 850–3900)
Lymphocytes Relative: 35 %
MCH: 29.3 pg (ref 27.0–33.0)
MCHC: 33.1 g/dL (ref 32.0–36.0)
MCV: 88.4 fL (ref 80.0–100.0)
MONO ABS: 560 {cells}/uL (ref 200–950)
MPV: 9.2 fL (ref 7.5–12.5)
Monocytes Relative: 7 %
NEUTROS ABS: 4480 {cells}/uL (ref 1500–7800)
NEUTROS PCT: 56 %
Platelets: 194 10*3/uL (ref 140–400)
RBC: 5.02 MIL/uL (ref 4.20–5.80)
RDW: 15.1 % — ABNORMAL HIGH (ref 11.0–15.0)
WBC: 8 10*3/uL (ref 3.8–10.8)

## 2016-12-15 LAB — COMPREHENSIVE METABOLIC PANEL
ALBUMIN: 4.2 g/dL (ref 3.6–5.1)
ALT: 27 U/L (ref 9–46)
AST: 24 U/L (ref 10–35)
Alkaline Phosphatase: 72 U/L (ref 40–115)
BUN: 15 mg/dL (ref 7–25)
CALCIUM: 10 mg/dL (ref 8.6–10.3)
CHLORIDE: 101 mmol/L (ref 98–110)
CO2: 27 mmol/L (ref 20–31)
Creat: 0.99 mg/dL (ref 0.70–1.25)
GLUCOSE: 127 mg/dL — AB (ref 70–99)
Potassium: 4.6 mmol/L (ref 3.5–5.3)
Sodium: 137 mmol/L (ref 135–146)
Total Bilirubin: 0.4 mg/dL (ref 0.2–1.2)
Total Protein: 6.4 g/dL (ref 6.1–8.1)

## 2016-12-15 LAB — LIPID PANEL
CHOL/HDL RATIO: 2.3 ratio (ref <5.0)
CHOLESTEROL: 137 mg/dL (ref <200)
HDL: 60 mg/dL (ref >40)
LDL Cholesterol: 37 mg/dL (ref <100)
TRIGLYCERIDES: 201 mg/dL — AB (ref <150)
VLDL: 40 mg/dL — AB (ref <30)

## 2016-12-16 LAB — HEMOGLOBIN A1C
Hgb A1c MFr Bld: 7 % — ABNORMAL HIGH (ref <5.7)
Mean Plasma Glucose: 154 mg/dL

## 2017-02-01 DIAGNOSIS — E119 Type 2 diabetes mellitus without complications: Secondary | ICD-10-CM | POA: Diagnosis not present

## 2017-02-01 DIAGNOSIS — H26493 Other secondary cataract, bilateral: Secondary | ICD-10-CM | POA: Diagnosis not present

## 2017-02-01 DIAGNOSIS — H04123 Dry eye syndrome of bilateral lacrimal glands: Secondary | ICD-10-CM | POA: Diagnosis not present

## 2017-02-06 ENCOUNTER — Encounter: Payer: Self-pay | Admitting: Internal Medicine

## 2017-02-16 ENCOUNTER — Encounter: Payer: Self-pay | Admitting: Family Medicine

## 2017-02-16 ENCOUNTER — Ambulatory Visit (INDEPENDENT_AMBULATORY_CARE_PROVIDER_SITE_OTHER): Payer: Medicare HMO | Admitting: Family Medicine

## 2017-02-16 VITALS — BP 132/74 | HR 86 | Temp 98.2°F | Resp 18 | Ht 71.0 in | Wt 257.0 lb

## 2017-02-16 DIAGNOSIS — M5432 Sciatica, left side: Secondary | ICD-10-CM

## 2017-02-16 DIAGNOSIS — N451 Epididymitis: Secondary | ICD-10-CM

## 2017-02-16 MED ORDER — PREDNISONE 20 MG PO TABS: ORAL_TABLET | ORAL | 0 refills | Status: DC

## 2017-02-16 MED ORDER — DOXYCYCLINE HYCLATE 100 MG PO TABS
100.0000 mg | ORAL_TABLET | Freq: Two times a day (BID) | ORAL | 0 refills | Status: DC
Start: 2017-02-16 — End: 2017-03-07

## 2017-02-16 NOTE — Progress Notes (Signed)
Subjective:    Patient ID: Cameron Daniel, male    DOB: 01-Nov-1945, 71 y.o.   MRN: [id number]  HPI Patient presents with left-sided testicular pain. His left epididymis is exquisitely tender to palpation. The pain radiates up the inguinal canal. He denies any dysuria or hematuria or fevers or chills. He also complains of low back pain radiating into his posterior left hip and down his posterior left thigh. He has no pain with internal or external rotation of the hip. He has no pain with flexion of the hip. He denies any falls or injuries. Past Medical History:  Diagnosis Date  . Allergic rhinitis, cause unspecified   . Coronary atherosclerosis of unspecified type of vessel, native or graft    1985 , arthroplasty x 1   . Depression    PTSD   . Diverticulosis of colon (without mention of hemorrhage)   . Esophageal reflux   . Esophageal stricture   . Flushing   . Hemorrhoids   . Irritable bowel syndrome   . Myocardial infarction ([place])   . Nocturia   . Obesity, unspecified   . Other acquired absence of organ   . Other and unspecified hyperlipidemia   . Other malaise and fatigue   . Personal history of colonic polyps   . Psychosexual dysfunction with inhibited sexual excitement   . Type II or unspecified type diabetes mellitus without mention of complication, not stated as uncontrolled   . Unspecified essential hypertension    Past Surgical History:  Procedure Laterality Date  . CARPAL TUNNEL RELEASE     left   . CHOLECYSTECTOMY    . COLON RESECTION     18 inches  . COLON SURGERY  2013  . CORONARY ANGIOPLASTY     1985   . ELBOW BURSA SURGERY    . SHOULDER SURGERY     Rt. Shoulder   Current Outpatient Prescriptions on File Prior to Visit  Medication Sig Dispense Refill  . aspirin 81 MG tablet Take 81 mg by mouth every morning.     . Cholecalciferol (VITAMIN D3) 1000 units CAPS Take 1,000 Units by mouth daily.    . citalopram (CELEXA) 10 MG tablet Take 20 mg by mouth  daily.     . colestipol (COLESTID) 5 g packet Take 5 g by mouth 2 (two) times daily.    . fexofenadine (ALLEGRA) 180 MG tablet Take 180 mg by mouth daily as needed. For allergies    . finasteride (PROSCAR) 5 MG tablet Take 1 tablet (5 mg total) by mouth daily. 90 tablet 3  . fluticasone (FLONASE) 50 MCG/ACT nasal spray Place 1 spray into the nose daily.     Marland Kitchen glipiZIDE (GLUCOTROL XL) 5 MG 24 hr tablet Take 1 tablet (5 mg total) by mouth daily with breakfast. 30 tablet 5  . meloxicam (MOBIC) 15 MG tablet Take 15 mg by mouth daily.    . Multiple Vitamin (MULTI-VITAMINS) TABS Take 1 capsule by mouth daily.    Marland Kitchen olmesartan (BENICAR) 20 MG tablet Take 1 tablet (20 mg total) by mouth daily. 90 tablet 3  . Olopatadine HCl (PATADAY) 0.2 % SOLN Apply 1 drop to eye daily as needed. 5 mL 5  . pantoprazole (PROTONIX) 40 MG tablet TAKE 1 TABLET (40 MG TOTAL) BY MOUTH EVERY MORNING. 90 tablet 3  . pioglitazone (ACTOS) 15 MG tablet TAKE ONE TABLET BY MOUTH EVERY MORNING 90 tablet 1  . rosuvastatin (CRESTOR) 10 MG tablet TAKE 1 TABLET (10  MG TOTAL) BY MOUTH DAILY. 90 tablet 3   No current facility-administered medications on file prior to visit.    Allergies  Allergen Reactions  . Doxazosin Mesylate     Reaction=unknown  . Doxazosin Mesylate Other (See Comments)  . Other Other (See Comments)  . Flomax [Tamsulosin Hcl]     Itching   . Jardiance [Empagliflozin] Other (See Comments)    Nausea, dizziness - Severe (per pt)  . Penicillins Rash and Other (See Comments)    Has patient had a PCN reaction causing immediate rash, facial/tongue/throat swelling, SOB or lightheadedness with hypotension: No Has patient had a PCN reaction causing severe rash involving mucus membranes or skin necrosis: No Has patient had a PCN reaction that required hospitalization No Has patient had a PCN reaction occurring within the last 10 years: No If all of the above answers are "NO", then may proceed with Cephalosporin use.     . Sulfonamide Derivatives Rash  . Trulicity [Dulaglutide] Nausea And Vomiting    N&V, Dizziness   Social History   Social History  . Marital status: Married    Spouse name: N/A  . Number of children: 1  . 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 Smoking status: Former Smoker    Quit date: 08/30/1983  . Smokeless tobacco: Former Systems developer  . Alcohol use Yes     Comment: rare  . Drug use: No  . Sexual activity: Yes   Other Topics Concern  . Not on file   Social History Narrative  . No narrative on file      Review of Systems  All other systems reviewed and are negative.      Objective:   Physical Exam  Cardiovascular: Normal rate, regular rhythm and normal heart sounds.   Pulmonary/Chest: Effort normal and breath sounds normal.  Abdominal: Hernia confirmed negative in the right inguinal area and confirmed negative in the left inguinal area.  Genitourinary: Penis normal. Right testis shows no mass and no tenderness. Left testis shows tenderness.  Musculoskeletal:       Lumbar back: He exhibits decreased range of motion, tenderness and pain.       Back:  Lymphadenopathy:       Right: No inguinal adenopathy present.       Left: No inguinal adenopathy present.  Vitals reviewed.         Assessment & Plan:  Epididymitis  Sciatica of left side  I believe the patient has left-sided epididymitis. Begin doxycycline 100 mg by mouth twice a day for 10 days recheck if no better in 1 week. I believe his low back pain could either be a torn hamstring versus sciatica. I will treat the patient with a prednisone taper pack as he is already been using meloxicam without relief. He will need to push fluids to counteract the hyperglycemia that will likely occur after the prednisone. Reassess in one week. Proceed with imaging of the spine if symptoms are no better

## 2017-02-24 ENCOUNTER — Other Ambulatory Visit: Payer: Self-pay | Admitting: Family Medicine

## 2017-02-24 MED ORDER — OLMESARTAN MEDOXOMIL 20 MG PO TABS
20.0000 mg | ORAL_TABLET | Freq: Every day | ORAL | 3 refills | Status: DC
Start: 2017-02-24 — End: 2018-02-19

## 2017-03-07 ENCOUNTER — Encounter: Payer: Self-pay | Admitting: Family Medicine

## 2017-03-07 ENCOUNTER — Ambulatory Visit (INDEPENDENT_AMBULATORY_CARE_PROVIDER_SITE_OTHER): Payer: Medicare HMO | Admitting: Family Medicine

## 2017-03-07 VITALS — BP 120/72 | HR 80 | Temp 98.2°F | Resp 18 | Ht 71.0 in | Wt 259.0 lb

## 2017-03-07 DIAGNOSIS — M542 Cervicalgia: Secondary | ICD-10-CM | POA: Diagnosis not present

## 2017-03-07 DIAGNOSIS — R5382 Chronic fatigue, unspecified: Secondary | ICD-10-CM | POA: Diagnosis not present

## 2017-03-07 DIAGNOSIS — M25512 Pain in left shoulder: Secondary | ICD-10-CM | POA: Diagnosis not present

## 2017-03-07 DIAGNOSIS — Z125 Encounter for screening for malignant neoplasm of prostate: Secondary | ICD-10-CM | POA: Diagnosis not present

## 2017-03-07 NOTE — Progress Notes (Signed)
Subjective:    Patient ID: Cameron Daniel, male    DOB: 10-26-1945, 71 y.o.   MRN: [id number]  HPI Patient is a very pleasant 71 year old Caucasian male who presents with the sudden onset of left-sided neck pain. Pain starts in the body of the sternocleidomastoid and radiates up to the mastoid process into his left jaw and then posteriorly into the trapezius and over to the acromial process of the left shoulder. He is tender to palpation in this area. There was no injury. He denies any falls. The patient's pain began suddenly without provocation. He denies any numbness or tingling in his arms. He denies any weakness in his hand. The pain is gradually improving. There is a palpable muscle spasm in this area which reproduces his pain. He has a negative Spurling's maneuver. He does have pain with Hawkins maneuver however I believe this is due to muscle stiffness. He has a negative empty can sign. He has normal reflexes and normal sensation in the left arm as well as normal strength in the left upper extremity. He also complains of chronic fatigue. He is concerned that he may have low testosterone which he previously has had in lab work we have obtained earlier. Past Medical History:  Diagnosis Date  . Allergic rhinitis, cause unspecified   . Coronary atherosclerosis of unspecified type of vessel, native or graft    1985 , arthroplasty x 1   . Depression    PTSD   . Diverticulosis of colon (without mention of hemorrhage)   . Esophageal reflux   . Esophageal stricture   . Flushing   . Hemorrhoids   . Irritable bowel syndrome   . Myocardial infarction ([place])   . Nocturia   . Obesity, unspecified   . Other acquired absence of organ   . Other and unspecified hyperlipidemia   . Other malaise and fatigue   . Personal history of colonic polyps   . Psychosexual dysfunction with inhibited sexual excitement   . Type II or unspecified type diabetes mellitus without mention of complication, not  stated as uncontrolled   . Unspecified essential hypertension    Past Surgical History:  Procedure Laterality Date  . CARPAL TUNNEL RELEASE     left   . CHOLECYSTECTOMY    . COLON RESECTION     18 inches  . COLON SURGERY  2013  . CORONARY ANGIOPLASTY     1985   . ELBOW BURSA SURGERY    . SHOULDER SURGERY     Rt. Shoulder   Current Outpatient Prescriptions on File Prior to Visit  Medication Sig Dispense Refill  . aspirin 81 MG tablet Take 81 mg by mouth every morning.     . Cholecalciferol (VITAMIN D3) 1000 units CAPS Take 1,000 Units by mouth daily.    . citalopram (CELEXA) 10 MG tablet Take 20 mg by mouth daily.     . colestipol (COLESTID) 5 g packet Take 5 g by mouth 2 (two) times daily.    . fexofenadine (ALLEGRA) 180 MG tablet Take 180 mg by mouth daily as needed. For allergies    . finasteride (PROSCAR) 5 MG tablet Take 1 tablet (5 mg total) by mouth daily. 90 tablet 3  . fluticasone (FLONASE) 50 MCG/ACT nasal spray Place 1 spray into the nose daily.     Marland Kitchen glipiZIDE (GLUCOTROL XL) 5 MG 24 hr tablet Take 1 tablet (5 mg total) by mouth daily with breakfast. 30 tablet 5  . meloxicam (MOBIC) 15  MG tablet Take 15 mg by mouth daily.    . Multiple Vitamin (MULTI-VITAMINS) TABS Take 1 capsule by mouth daily.    Marland Kitchen olmesartan (BENICAR) 20 MG tablet Take 1 tablet (20 mg total) by mouth daily. 30 tablet 3  . Olopatadine HCl (PATADAY) 0.2 % SOLN Apply 1 drop to eye daily as needed. 5 mL 5  . pantoprazole (PROTONIX) 40 MG tablet TAKE 1 TABLET (40 MG TOTAL) BY MOUTH EVERY MORNING. 90 tablet 3  . pioglitazone (ACTOS) 15 MG tablet TAKE ONE TABLET BY MOUTH EVERY MORNING 90 tablet 1  . rosuvastatin (CRESTOR) 10 MG tablet TAKE 1 TABLET (10 MG TOTAL) BY MOUTH DAILY. 90 tablet 3   No current facility-administered medications on file prior to visit.    Allergies  Allergen Reactions  . Doxazosin Mesylate     Reaction=unknown  . Doxazosin Mesylate Other (See Comments)  . Other Other (See  Comments)  . Flomax [Tamsulosin Hcl]     Itching   . Jardiance [Empagliflozin] Other (See Comments)    Nausea, dizziness - Severe (per pt)  . Penicillins Rash and Other (See Comments)    Has patient had a PCN reaction causing immediate rash, facial/tongue/throat swelling, SOB or lightheadedness with hypotension: No Has patient had a PCN reaction causing severe rash involving mucus membranes or skin necrosis: No Has patient had a PCN reaction that required hospitalization No Has patient had a PCN reaction occurring within the last 10 years: No If all of the above answers are "NO", then may proceed with Cephalosporin use.   . Sulfonamide Derivatives Rash  . Trulicity [Dulaglutide] Nausea And Vomiting    N&V, Dizziness   Social History   Social History  . Marital status: Married    Spouse name: N/A  . Number of children: 1  . 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 Smoking status: Former Smoker    Quit date: 08/30/1983  . Smokeless tobacco: Former Systems developer  . Alcohol use Yes     Comment: rare  . Drug use: No  . Sexual activity: Yes   Other Topics Concern  . Not on file   Social History Narrative  . No narrative on file      Review of Systems  All other systems reviewed and are negative.      Objective:   Physical Exam  Constitutional: He appears well-developed and well-nourished.  Cardiovascular: Normal rate, regular rhythm, normal heart sounds and intact distal pulses.   No murmur heard. Pulmonary/Chest: Effort normal and breath sounds normal.  Musculoskeletal:       Cervical back: He exhibits tenderness, pain and spasm. He exhibits normal range of motion.       Back:  Skin:     Vitals reviewed.         Assessment & Plan:  Prostate cancer screening - Plan: PSA  Chronic fatigue - Plan: Testosterone  Neck pain on left side  Acute pain of left shoulder  The area of pain is outlined on the diagram in  red ink. I believe he is suffering from a muscle strain in his trapezius/sternocleidomastoid muscle group. I do not believe this is neuropathic although that is on the differential diagnosis. However he has normal reflexes in the left arm, normal strength in left arm, and no numbness or tingling in the left arm. I suspect that this is a muscle spasm. It is slowly resolving spontaneously. Should  the pain return, I would proceed with imaging of the neck. Presently he is now pain-free has very little minimal pain 1 on a scale of 10. I believe his fatigue is likely due to hypogonadism. I will check a testosterone level. He is interested in treatment and therefore I'll check a PSA to obtain a baseline. If he starts testosterone replacement, we will need to recheck PSA in 3 months

## 2017-03-08 LAB — PSA: PSA: 1.2 ng/mL (ref <=4.0)

## 2017-03-08 LAB — TESTOSTERONE: Testosterone: 202 ng/dL — ABNORMAL LOW (ref 250–827)

## 2017-03-09 DIAGNOSIS — H9113 Presbycusis, bilateral: Secondary | ICD-10-CM | POA: Diagnosis not present

## 2017-03-09 DIAGNOSIS — J309 Allergic rhinitis, unspecified: Secondary | ICD-10-CM | POA: Diagnosis not present

## 2017-03-09 DIAGNOSIS — E119 Type 2 diabetes mellitus without complications: Secondary | ICD-10-CM | POA: Diagnosis not present

## 2017-03-09 DIAGNOSIS — Z7984 Long term (current) use of oral hypoglycemic drugs: Secondary | ICD-10-CM | POA: Diagnosis not present

## 2017-03-09 DIAGNOSIS — Z0000 Encounter for general adult medical examination without abnormal findings: Secondary | ICD-10-CM | POA: Diagnosis not present

## 2017-03-09 DIAGNOSIS — E669 Obesity, unspecified: Secondary | ICD-10-CM | POA: Diagnosis not present

## 2017-03-09 DIAGNOSIS — R69 Illness, unspecified: Secondary | ICD-10-CM | POA: Diagnosis not present

## 2017-03-09 DIAGNOSIS — K219 Gastro-esophageal reflux disease without esophagitis: Secondary | ICD-10-CM | POA: Diagnosis not present

## 2017-03-09 DIAGNOSIS — E7800 Pure hypercholesterolemia, unspecified: Secondary | ICD-10-CM | POA: Diagnosis not present

## 2017-03-09 DIAGNOSIS — Z972 Presence of dental prosthetic device (complete) (partial): Secondary | ICD-10-CM | POA: Diagnosis not present

## 2017-03-09 DIAGNOSIS — N400 Enlarged prostate without lower urinary tract symptoms: Secondary | ICD-10-CM | POA: Diagnosis not present

## 2017-03-09 DIAGNOSIS — Z87891 Personal history of nicotine dependence: Secondary | ICD-10-CM | POA: Diagnosis not present

## 2017-03-09 DIAGNOSIS — Z79899 Other long term (current) drug therapy: Secondary | ICD-10-CM | POA: Diagnosis not present

## 2017-03-09 DIAGNOSIS — I10 Essential (primary) hypertension: Secondary | ICD-10-CM | POA: Diagnosis not present

## 2017-03-10 ENCOUNTER — Other Ambulatory Visit: Payer: Self-pay | Admitting: Family Medicine

## 2017-03-10 MED ORDER — TESTOSTERONE CYPIONATE 200 MG/ML IM SOLN: 200.0000 mg | INTRAMUSCULAR | 5 refills | Status: DC

## 2017-03-13 ENCOUNTER — Ambulatory Visit (INDEPENDENT_AMBULATORY_CARE_PROVIDER_SITE_OTHER): Payer: Medicare HMO

## 2017-03-13 DIAGNOSIS — R5383 Other fatigue: Secondary | ICD-10-CM

## 2017-03-13 DIAGNOSIS — R7989 Other specified abnormal findings of blood chemistry: Secondary | ICD-10-CM

## 2017-03-13 MED ORDER — TESTOSTERONE CYPIONATE 200 MG/ML IM SOLN
200.0000 mg | Freq: Once | INTRAMUSCULAR | Status: AC
  Administered 2017-03-13: 200 mg via INTRAMUSCULAR

## 2017-03-13 NOTE — Progress Notes (Signed)
Patient received 200mg /ml testosterone injection in the right ventrogluteal, Patient tolerated well.

## 2017-03-29 ENCOUNTER — Ambulatory Visit (INDEPENDENT_AMBULATORY_CARE_PROVIDER_SITE_OTHER): Payer: Medicare HMO | Admitting: Family Medicine

## 2017-03-29 DIAGNOSIS — E291 Testicular hypofunction: Secondary | ICD-10-CM | POA: Diagnosis not present

## 2017-03-29 MED ORDER — TESTOSTERONE CYPIONATE 200 MG/ML IM SOLN
200.0000 mg | INTRAMUSCULAR | Status: AC
  Administered 2017-03-29 – 2018-04-06 (×20): 200 mg via INTRAMUSCULAR

## 2017-04-06 ENCOUNTER — Encounter: Payer: Self-pay | Admitting: Family Medicine

## 2017-04-11 ENCOUNTER — Ambulatory Visit (INDEPENDENT_AMBULATORY_CARE_PROVIDER_SITE_OTHER): Payer: Medicare HMO | Admitting: Family Medicine

## 2017-04-11 ENCOUNTER — Encounter: Payer: Self-pay | Admitting: Family Medicine

## 2017-04-11 VITALS — BP 120/68 | HR 95 | Temp 97.8°F | Resp 16 | Wt 265.0 lb

## 2017-04-11 DIAGNOSIS — J302 Other seasonal allergic rhinitis: Secondary | ICD-10-CM | POA: Diagnosis not present

## 2017-04-11 MED ORDER — OLOPATADINE HCL 0.1 % OP SOLN: 1.0000 [drp] | Freq: Two times a day (BID) | OPHTHALMIC | 12 refills | Status: DC

## 2017-04-11 NOTE — Progress Notes (Signed)
Subjective:    Patient ID: Cameron Daniel, male    DOB: 20-Apr-1946, 71 y.o.   MRN: [id number]  HPI Patient has severe allergies. He is tried allergy shots in the past but discontinued them after 3-4 years due to no benefit. He is currently on Allegra 180 mg daily. He intermittently uses Flonase. He presents today due to red itchy watery eyes.  In the past, he has had good success with Patanol. Past Medical History:  Diagnosis Date  . Allergic rhinitis, cause unspecified   . Coronary atherosclerosis of unspecified type of vessel, native or graft    1985 , arthroplasty x 1   . Depression    PTSD   . Diverticulosis of colon (without mention of hemorrhage)   . Esophageal reflux   . Esophageal stricture   . Flushing   . Hemorrhoids   . Irritable bowel syndrome   . Myocardial infarction ([place])   . Nocturia   . Obesity, unspecified   . Other acquired absence of organ   . Other and unspecified hyperlipidemia   . Other malaise and fatigue   . Personal history of colonic polyps   . Psychosexual dysfunction with inhibited sexual excitement   . Type II or unspecified type diabetes mellitus without mention of complication, not stated as uncontrolled   . Unspecified essential hypertension    Past Surgical History:  Procedure Laterality Date  . CARPAL TUNNEL RELEASE     left   . CHOLECYSTECTOMY    . COLON RESECTION     18 inches  . COLON SURGERY  2013  . CORONARY ANGIOPLASTY     1985   . ELBOW BURSA SURGERY    . SHOULDER SURGERY     Rt. Shoulder   Current Outpatient Prescriptions on File Prior to Visit  Medication Sig Dispense Refill  . aspirin 81 MG tablet Take 81 mg by mouth every morning.     . Cholecalciferol (VITAMIN D3) 1000 units CAPS Take 1,000 Units by mouth daily.    . citalopram (CELEXA) 10 MG tablet Take 20 mg by mouth daily.     . colestipol (COLESTID) 5 g packet Take 5 g by mouth 2 (two) times daily.    . fexofenadine (ALLEGRA) 180 MG tablet Take 180 mg by mouth  daily as needed. For allergies    . finasteride (PROSCAR) 5 MG tablet Take 1 tablet (5 mg total) by mouth daily. 90 tablet 3  . fluticasone (FLONASE) 50 MCG/ACT nasal spray Place 1 spray into the nose daily.     Marland Kitchen glipiZIDE (GLUCOTROL XL) 5 MG 24 hr tablet Take 1 tablet (5 mg total) by mouth daily with breakfast. 30 tablet 5  . meloxicam (MOBIC) 15 MG tablet Take 15 mg by mouth daily.    . Multiple Vitamin (MULTI-VITAMINS) TABS Take 1 capsule by mouth daily.    Marland Kitchen olmesartan (BENICAR) 20 MG tablet Take 1 tablet (20 mg total) by mouth daily. 30 tablet 3  . Olopatadine HCl (PATADAY) 0.2 % SOLN Apply 1 drop to eye daily as needed. 5 mL 5  . pantoprazole (PROTONIX) 40 MG tablet TAKE 1 TABLET (40 MG TOTAL) BY MOUTH EVERY MORNING. 90 tablet 3  . pioglitazone (ACTOS) 15 MG tablet TAKE ONE TABLET BY MOUTH EVERY MORNING 90 tablet 1  . rosuvastatin (CRESTOR) 10 MG tablet TAKE 1 TABLET (10 MG TOTAL) BY MOUTH DAILY. 90 tablet 3  . testosterone cypionate (DEPOTESTOSTERONE CYPIONATE) 200 MG/ML injection Inject 1 mL (200 mg total)  into the muscle every 14 (fourteen) days. 10 mL 5   Current Facility-Administered Medications on File Prior to Visit  Medication Dose Route Frequency Provider Last Rate Last Dose  . testosterone cypionate (DEPOTESTOSTERONE CYPIONATE) injection 200 mg  200 mg Intramuscular Q14 Days [name], MD   200 mg at 03/29/17 1429   Allergies  Allergen Reactions  . Doxazosin Mesylate     Reaction=unknown  . Doxazosin Mesylate Other (See Comments)  . Other Other (See Comments)  . Flomax [Tamsulosin Hcl]     Itching   . Jardiance [Empagliflozin] Other (See Comments)    Nausea, dizziness - Severe (per pt)  . Penicillins Rash and Other (See Comments)    Has patient had a PCN reaction causing immediate rash, facial/tongue/throat swelling, SOB or lightheadedness with hypotension: No Has patient had a PCN reaction causing severe rash involving mucus membranes or skin necrosis: No Has  patient had a PCN reaction that required hospitalization No Has patient had a PCN reaction occurring within the last 10 years: No If all of the above answers are "NO", then may proceed with Cephalosporin use.   . Sulfonamide Derivatives Rash  . Trulicity [Dulaglutide] Nausea And Vomiting    N&V, Dizziness   Social History   Social History  . Marital status: Married    Spouse name: N/A  . Number of children: 1  . 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 Smoking status: Former Smoker    Quit date: 08/30/1983  . Smokeless tobacco: Former Systems developer  . Alcohol use Yes     Comment: rare  . Drug use: No  . Sexual activity: Yes   Other Topics Concern  . Not on file   Social History Narrative  . No narrative on file      Review of Systems  All other systems reviewed and are negative.      Objective:   Physical Exam  Constitutional: He appears well-developed and well-nourished. No distress.  HENT:  Right Ear: External ear normal.  Left Ear: External ear normal.  Nose: Nose normal.  Mouth/Throat: Oropharynx is clear and moist.  Eyes: Right conjunctiva is injected. Right conjunctiva has no hemorrhage. Left conjunctiva is injected. Left conjunctiva has no hemorrhage.  Neck: Neck supple.  Cardiovascular: Normal rate, regular rhythm, normal heart sounds and intact distal pulses.   No murmur heard. Pulmonary/Chest: Effort normal and breath sounds normal. No respiratory distress. He has no wheezes. He has no rales.  Musculoskeletal: He exhibits no edema.  Lymphadenopathy:    He has no cervical adenopathy.  Skin: He is not diaphoretic.  Vitals reviewed.         Assessment & Plan:  Seasonal allergic rhinitis, unspecified trigger  I believe the patient is having allergic reaction to ragweed. Continue Allegra and add Flonase 2 sprays each nostril daily. Also recommended that he resume patanol 1 drop each eye twice daily. I  dissuaded the patient from prednisone (which clears me up everytime) by explaining hte long term risks of glucocorticoids.  Of note, he is continuing to have pain in the left side of his neck radiating into his left shoulder now down his left arm with numbness and tingling in his left second and third digit. Pain sounds more now like cervical radiculopathy rather than muscle spasms in the neck. 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 I offered  the patient an MRI of the cervical spine to see if he'll be a candidate for an epidural steroid injection. He deferred at the present time

## 2017-04-12 DIAGNOSIS — E119 Type 2 diabetes mellitus without complications: Secondary | ICD-10-CM | POA: Diagnosis not present

## 2017-04-13 ENCOUNTER — Encounter: Payer: Self-pay | Admitting: Internal Medicine

## 2017-04-13 ENCOUNTER — Ambulatory Visit (AMBULATORY_SURGERY_CENTER): Payer: Self-pay

## 2017-04-13 VITALS — Ht 71.0 in | Wt 262.8 lb

## 2017-04-13 DIAGNOSIS — Z86010 Personal history of colonic polyps: Secondary | ICD-10-CM

## 2017-04-13 MED ORDER — NA SULFATE-K SULFATE-MG SULF 17.5-3.13-1.6 GM/177ML PO SOLN: 1.0000 | Freq: Once | ORAL | 0 refills | Status: AC

## 2017-04-13 NOTE — Progress Notes (Signed)
Denies allergies to eggs or soy products. Denies complication of anesthesia or sedation. Denies use of weight loss medication. Denies use of O2.   Emmi instructions declined.  

## 2017-04-25 ENCOUNTER — Telehealth: Payer: Self-pay | Admitting: Internal Medicine

## 2017-04-25 NOTE — Telephone Encounter (Signed)
Pt states he is scheduled for a colon on Thursday. Pt states he is having a lot of gas, bloating, and some diarrhea. Pt states he is not having any pain and no fevers. He wants to know if he is ok to have colon done. Pt states it feels a little bit like diverticulitis but no pain. Please advise.

## 2017-04-25 NOTE — Telephone Encounter (Signed)
Okay to proceed unless he develops pain and/or fever. I appreciate him checking

## 2017-04-25 NOTE — Telephone Encounter (Signed)
Spoke with pt and he is aware. 

## 2017-04-27 ENCOUNTER — Encounter: Payer: Self-pay | Admitting: Internal Medicine

## 2017-04-27 ENCOUNTER — Ambulatory Visit (AMBULATORY_SURGERY_CENTER): Payer: Medicare HMO | Admitting: Internal Medicine

## 2017-04-27 VITALS — BP 122/65 | HR 76 | Temp 97.3°F | Resp 24 | Ht 71.0 in | Wt 262.0 lb

## 2017-04-27 DIAGNOSIS — D124 Benign neoplasm of descending colon: Secondary | ICD-10-CM | POA: Diagnosis not present

## 2017-04-27 DIAGNOSIS — Z860100 Personal history of colon polyps, unspecified: Secondary | ICD-10-CM

## 2017-04-27 DIAGNOSIS — D125 Benign neoplasm of sigmoid colon: Secondary | ICD-10-CM

## 2017-04-27 DIAGNOSIS — D123 Benign neoplasm of transverse colon: Secondary | ICD-10-CM

## 2017-04-27 DIAGNOSIS — K635 Polyp of colon: Secondary | ICD-10-CM

## 2017-04-27 MED ORDER — SODIUM CHLORIDE 0.9 % IV SOLN: 500.0000 mL | INTRAVENOUS | Status: DC

## 2017-04-27 NOTE — Progress Notes (Signed)
Called to room to assist during endoscopic procedure.  Patient ID and intended procedure confirmed with present staff. Received instructions for my participation in the procedure from the performing physician.  

## 2017-04-27 NOTE — Op Note (Signed)
[place] [name] Procedure Date: 04/27/2017 8:07 AM MRN: [id number] Endoscopist: Docia Chuck. Henrene Pastor , MD Age: 71 Referring MD:  Date of Birth: 1946/05/06 Gender: Male Account #: [id number] Procedure:                Colonoscopy, with cold snare polypectomy X3 Indications:              High risk colon cancer surveillance: Personal                            history of multiple (3 or more) adenomas. Multiple                            previous examinations 2003, 2008, 2013 [facility]                            in [place] Status post sigmoid colectomy 2013 for                            diverticular disease Medicines:                Monitored Anesthesia Care Procedure:                Pre-Anesthesia Assessment:                           - Prior to the procedure, a History and Physical                            was performed, and patient medications and                            allergies were reviewed. The patient's tolerance of                            previous anesthesia was also reviewed. The risks                            and benefits of the procedure and the sedation                            options and risks were discussed with the patient.                            All questions were answered, and informed consent                            was obtained. Prior Anticoagulants: The patient has                            taken no previous anticoagulant or antiplatelet                            agents. ASA Grade Assessment: II - A patient with  mild systemic disease. After reviewing the risks                            and benefits, the patient was deemed in                            satisfactory condition to undergo the procedure.                           After obtaining informed consent, the colonoscope                            was passed under direct vision. Throughout the                            procedure,  the patient's blood pressure, pulse, and                            oxygen saturations were monitored continuously. The                            Colonoscope was introduced through the anus and                            advanced to the the cecum, identified by                            appendiceal orifice and ileocecal valve. The                            ileocecal valve, appendiceal orifice, and rectum                            were photographed. The quality of the bowel                            preparation was good. The colonoscopy was performed                            without difficulty. The patient tolerated the                            procedure well. The bowel preparation used was                            SUPREP. Scope In: 8:30:08 AM Scope Out: 8:44:30 AM Scope Withdrawal Time: 0 hours 12 minutes 36 seconds  Total Procedure Duration: 0 hours 14 minutes 22 seconds  Findings:                 Three polyps were found in the sigmoid colon,                            descending colon and transverse colon. The polyps  were 2 to 5 mm in size. These polyps were removed                            with a cold snare. Resection and retrieval were                            complete.                           Multiple diverticula were found in the entire colon.                           Internal hemorrhoids were found during retroflexion.                           The exam was otherwise without abnormality on                            direct and retroflexion views. Complications:            No immediate complications. Estimated blood loss:                            None. Estimated Blood Loss:     Estimated blood loss: none. Impression:               - Three 2 to 5 mm polyps in the sigmoid colon, in                            the descending colon and in the transverse colon,                            removed with a cold snare. Resected and retrieved.                            - Diverticulosis in the entire examined colon.                           - Internal hemorrhoids and hypertrophic anal                            papilla.                           - Healthy anastomosis from sigmoid resection at 30                            cm.The examination was otherwise normal on direct                            and retroflexion views. Recommendation:           - Repeat colonoscopy in 3 - 5 years for                            surveillance.                           -  Patient has a contact number available for                            emergencies. The signs and symptoms of potential                            delayed complications were discussed with the                            patient. Return to normal activities tomorrow.                            Written discharge instructions were provided to the                            patient.                           - Resume previous diet.                           - Continue present medications.                           - Await pathology results. Docia Chuck. Henrene Pastor, MD 04/27/2017 9:00:41 AM This report has been signed electronically.

## 2017-04-27 NOTE — Progress Notes (Signed)
Report to PACU, RN, vss, BBS= Clear.  

## 2017-04-27 NOTE — Patient Instructions (Signed)
YOU HAD AN ENDOSCOPIC PROCEDURE TODAY AT [facility]:   Refer to the procedure report that was given to you for any specific questions about what was found during the examination.  If the procedure report does not answer your questions, please call your gastroenterologist to clarify.  If you requested that your care partner not be given the details of your procedure findings, then the procedure report has been included in a sealed envelope for you to review at your convenience later.  YOU SHOULD EXPECT: Some feelings of bloating in the abdomen. Passage of more gas than usual.  Walking can help get rid of the air that was put into your GI tract during the procedure and reduce the bloating. If you had a lower endoscopy (such as a colonoscopy or flexible sigmoidoscopy) you may notice spotting of blood in your stool or on the toilet paper. If you underwent a bowel prep for your procedure, you may not have a normal bowel movement for a few days.  Please Note:  You might notice some irritation and congestion in your nose or some drainage.  This is from the oxygen used during your procedure.  There is no need for concern and it should clear up in a day or so.  SYMPTOMS TO REPORT IMMEDIATELY:   Following lower endoscopy (colonoscopy or flexible sigmoidoscopy):  Excessive amounts of blood in the stool  Significant tenderness or worsening of abdominal pains  Swelling of the abdomen that is new, acute  Fever of 100F or higher   Following upper endoscopy (EGD)  Vomiting of blood or coffee ground material  New chest pain or pain under the shoulder blades  Painful or persistently difficult swallowing  New shortness of breath  Fever of 100F or higher  Black, tarry-looking stools  For urgent or emergent issues, a gastroenterologist can be reached at any hour by calling [phone].   DIET:  We do recommend a small meal at first, but then you may proceed to your regular diet.  Drink  plenty of fluids but you should avoid alcoholic beverages for 24 hours.  ACTIVITY:  You should plan to take it easy for the rest of today and you should NOT DRIVE or use heavy machinery until tomorrow (because of the sedation medicines used during the test).    FOLLOW UP: Our staff will call the number listed on your records the next business day following your procedure to check on you and address any questions or concerns that you may have regarding the information given to you following your procedure. If we do not reach you, we will leave a message.  However, if you are feeling well and you are not experiencing any problems, there is no need to return our call.  We will assume that you have returned to your regular daily activities without incident.  If any biopsies were taken you will be contacted by phone or by letter within the next 1-3 weeks.  Please call us at [phone] if you have not heard about the biopsies in 3 weeks.    SIGNATURES/CONFIDENTIALITY: You and/or your care partner have signed paperwork which will be entered into your electronic medical record.  These signatures attest to the fact that that the information above on your After Visit Summary has been reviewed and is understood.  Full responsibility of the confidentiality of this discharge information lies with you and/or your care-partner.YOU HAD AN ENDOSCOPIC PROCEDURE TODAY AT [facility]:  Refer to the procedure report that was given to you for any specific questions about what was found during the examination.  If the procedure report does not answer your questions, please call your gastroenterologist to clarify.  If you requested that your care partner not be given the details of your procedure findings, then the procedure report has been included in a sealed envelope for you to review at your convenience later.  YOU SHOULD EXPECT: Some feelings of bloating in the abdomen. Passage of more gas than  usual.  Walking can help get rid of the air that was put into your GI tract during the procedure and reduce the bloating. If you had a lower endoscopy (such as a colonoscopy or flexible sigmoidoscopy) you may notice spotting of blood in your stool or on the toilet paper. If you underwent a bowel prep for your procedure, you may not have a normal bowel movement for a few days.  Please Note:  You might notice some irritation and congestion in your nose or some drainage.  This is from the oxygen used during your procedure.  There is no need for concern and it should clear up in a day or so.  SYMPTOMS TO REPORT IMMEDIATELY:   Following lower endoscopy (colonoscopy or flexible sigmoidoscopy):  Excessive amounts of blood in the stool  Significant tenderness or worsening of abdominal pains  Swelling of the abdomen that is new, acute  Fever of 100F or higher  For urgent or emergent issues, a gastroenterologist can be reached at any hour by calling [phone].  DIET:  We do recommend a small meal at first, but then you may proceed to your regular diet.  Drink plenty of fluids but you should avoid alcoholic beverages for 24 hours.  ACTIVITY:  You should plan to take it easy for the rest of today and you should NOT DRIVE or use heavy machinery until tomorrow (because of the sedation medicines used during the test).    FOLLOW UP: Our staff will call the number listed on your records the next business day following your procedure to check on you and address any questions or concerns that you may have regarding the information given to you following your procedure. If we do not reach you, we will leave a message.  However, if you are feeling well and you are not experiencing any problems, there is no need to return our call.  We will assume that you have returned to your regular daily activities without incident.  If any biopsies were taken you will be contacted by phone or by letter within the next 1-3  weeks.  Please call us at [phone] if you have not heard about the biopsies in 3 weeks.   SIGNATURES/CONFIDENTIALITY: You and/or your care partner have signed paperwork which will be entered into your electronic medical record.  These signatures attest to the fact that that the information above on your After Visit Summary has been reviewed and is understood.  Full responsibility of the confidentiality of this discharge information lies with you and/or your care-partner.  Await pathology  Please read over handouts about polyps, diverticulosis, hemorrhoids and high fiber diets  Continue your medication; your blood sugar in the recovery room was 171

## 2017-04-28 ENCOUNTER — Telehealth: Payer: Self-pay | Admitting: *Deleted

## 2017-04-28 ENCOUNTER — Ambulatory Visit (INDEPENDENT_AMBULATORY_CARE_PROVIDER_SITE_OTHER): Payer: Medicare HMO

## 2017-04-28 DIAGNOSIS — E291 Testicular hypofunction: Secondary | ICD-10-CM

## 2017-04-28 NOTE — Progress Notes (Signed)
Patient received testosterone injection in right ventrogluteal. Patient tolerated well

## 2017-04-28 NOTE — Telephone Encounter (Signed)
  Follow up Call-  Call back number 04/27/2017  Post procedure Call Back phone  # [phone]  Permission to leave phone message Yes  Some recent data might be hidden     Patient questions:  Do you have a fever, pain , or abdominal swelling? No. Pain Score  0 *  Have you tolerated food without any problems? Yes.    Have you been able to return to your normal activities? Yes.    Do you have any questions about your discharge instructions: Diet   No. Medications  No. Follow up visit  No.  Do you have questions or concerns about your Care? No.  Actions: * If pain score is 4 or above: No action needed, pain <4.

## 2017-05-02 ENCOUNTER — Encounter: Payer: Self-pay | Admitting: Internal Medicine

## 2017-05-08 ENCOUNTER — Telehealth: Payer: Self-pay | Admitting: Internal Medicine

## 2017-05-08 NOTE — Telephone Encounter (Signed)
Pt states that since the colonoscopy he has been having gas after he eats anything. He is taking gas-x, beano and align. He reports he is not having pain. Pt states he has taken Flagyl in the past and wonders if he needs a prescription of this again. Please advise.

## 2017-05-09 MED ORDER — METRONIDAZOLE 250 MG PO TABS: 250.0000 mg | ORAL_TABLET | Freq: Three times a day (TID) | ORAL | 0 refills | Status: DC

## 2017-05-09 NOTE — Telephone Encounter (Signed)
Flagyl 250 mg tid for 10 days

## 2017-05-09 NOTE — Telephone Encounter (Signed)
Pt aware and script sent to pharmacy. 

## 2017-05-16 ENCOUNTER — Ambulatory Visit (INDEPENDENT_AMBULATORY_CARE_PROVIDER_SITE_OTHER): Payer: Medicare HMO | Admitting: Family Medicine

## 2017-05-16 DIAGNOSIS — E291 Testicular hypofunction: Secondary | ICD-10-CM | POA: Diagnosis not present

## 2017-05-29 ENCOUNTER — Ambulatory Visit (INDEPENDENT_AMBULATORY_CARE_PROVIDER_SITE_OTHER): Payer: Medicare HMO | Admitting: Family Medicine

## 2017-05-29 DIAGNOSIS — E291 Testicular hypofunction: Secondary | ICD-10-CM

## 2017-05-29 DIAGNOSIS — Z23 Encounter for immunization: Secondary | ICD-10-CM | POA: Diagnosis not present

## 2017-06-05 ENCOUNTER — Other Ambulatory Visit: Payer: Self-pay | Admitting: Family Medicine

## 2017-06-05 NOTE — Telephone Encounter (Signed)
Medication refill for one time only.  Patient needs to be seen.  Letter sent for patient to call and schedule 

## 2017-06-08 ENCOUNTER — Ambulatory Visit (INDEPENDENT_AMBULATORY_CARE_PROVIDER_SITE_OTHER): Payer: Medicare HMO

## 2017-06-08 ENCOUNTER — Encounter (INDEPENDENT_AMBULATORY_CARE_PROVIDER_SITE_OTHER): Payer: Self-pay | Admitting: Orthopaedic Surgery

## 2017-06-08 ENCOUNTER — Ambulatory Visit (INDEPENDENT_AMBULATORY_CARE_PROVIDER_SITE_OTHER): Payer: Medicare HMO | Admitting: Orthopaedic Surgery

## 2017-06-08 VITALS — BP 141/70 | HR 93 | Resp 14 | Ht 70.0 in | Wt 262.0 lb

## 2017-06-08 DIAGNOSIS — M25561 Pain in right knee: Secondary | ICD-10-CM

## 2017-06-08 MED ORDER — BUPIVACAINE HCL 0.5 % IJ SOLN
3.0000 mL | INTRAMUSCULAR | Status: AC | PRN
  Administered 2017-06-08: 3 mL via INTRA_ARTICULAR

## 2017-06-08 MED ORDER — LIDOCAINE HCL 1 % IJ SOLN
5.0000 mL | INTRAMUSCULAR | Status: AC | PRN
  Administered 2017-06-08: 5 mL

## 2017-06-08 MED ORDER — METHYLPREDNISOLONE ACETATE 40 MG/ML IJ SUSP
80.0000 mg | INTRAMUSCULAR | Status: AC | PRN
Start: 2017-06-08 — End: 2017-06-08
  Administered 2017-06-08: 80 mg

## 2017-06-08 NOTE — Progress Notes (Signed)
Office Visit Note   Patient: Cameron Daniel           Date of Birth: Dec 05, 1945           MRN: [id number] Visit Date: 06/08/2017              Requested by: [name], MD [address] [facility] [place],  [id number] PCP: [name], MD   Assessment & Plan: Visit Diagnoses:  1. Right knee pain, unspecified chronicity    Mild osteoarthritis patellofemoral and medial compartments Plan: Intra-articular cortisone injection right knee as a diagnostic and therapeutic modality. Follow up 3-4 weeks if no improvement. Possible tear medial meniscus  Follow-Up Instructions: Return if symptoms worsen or fail to improve.   Orders:  Orders Placed This Encounter  Procedures  . Large Joint Injection/Arthrocentesis  . XR KNEE 3 VIEW RIGHT   No orders of the defined types were placed in this encounter.     Procedures: Large Joint Inj Date/Time: 06/08/2017 10:47 AM Performed by: Garald Balding Authorized by: Garald Balding   Consent Given by:  Patient Timeout: prior to procedure the correct patient, procedure, and site was verified   Indications:  Pain and joint swelling Location:  Knee Site:  R knee Prep: patient was prepped and draped in usual sterile fashion   Needle Size:  25 G Needle Length:  1.5 inches Approach:  Anteromedial Ultrasound Guidance: No   Fluoroscopic Guidance: No   Arthrogram: No   Medications:  5 mL lidocaine 1 %; 80 mg methylPREDNISolone acetate 40 MG/ML; 3 mL bupivacaine 0.5 % Aspiration Attempted: No   Patient tolerance:  Patient tolerated the procedure well with no immediate complications     Clinical Data: No additional findings.   Subjective: Chief Complaint  Patient presents with  . Right Knee - Pain, Injury    Cameron Daniel is a 71 y o that presents with R knee pain x 1 week. Mowing too much with a pro mower, right knee pressing the gas.   No obvious injury or trauma. No fever or chills. Believes his pain may be  related to continuous pressure on a commercial lawnmower. Not had any fever or chills. No effusion. No pain at thigh or calf. Does have some "noise" beneath his patella with difficulty with flexion and extension. He has not locked. No back pain.  HPI  Review of Systems  Constitutional: Negative for fatigue.  HENT: Positive for hearing loss.   Respiratory: Negative for apnea, chest tightness and shortness of breath.   Cardiovascular: Negative for chest pain, palpitations and leg swelling.  Gastrointestinal: Negative for blood in stool, constipation and diarrhea.  Genitourinary: Negative for difficulty urinating.  Musculoskeletal: Positive for back pain, joint swelling, myalgias, neck pain and neck stiffness. Negative for arthralgias.  Neurological: Negative for weakness, numbness and headaches.  Hematological: Does not bruise/bleed easily.  Psychiatric/Behavioral: Negative for sleep disturbance. The patient is not nervous/anxious.      Objective: Vital Signs: BP (!) 141/70   Pulse 93   Resp 14   Ht 5\' 10"  (1.778 m)   Wt 262 lb (118.8 kg)   BMI 37.59 kg/m   Physical Exam  Ortho Exam awake alert and oriented 3. Comfortable sitting. Mild crepitation with patella motion. Mild pain. No effusion. No medial anterior joint pain some posterior medially. No popping or clicking. No lateral joint pain. Full extension. No instability. No popliteal pain. No calf pain or swelling distally. Neurovascular exam intact.  Walks without a limp.  Specialty Comments:  No specialty comments available.  Imaging: Xr Knee 3 View Right  Result Date: 06/08/2017 Films of the right knee were obtained in 3 projections standing. There is minimal decrease in the medial joint space. No malalignment. No ectopic calcification. No irregularity along the joint surface either medial lateral joint. 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 Gout 04/06/2012  . DIVERTICULITIS-COLON 06/18/2010  . FACIAL FLUSHING 12/11/2007  . NOCTURIA 12/11/2007  . DIABETES MELLITUS, TYPE II 09/07/2007  . OTHER MALAISE AND FATIGUE 09/07/2007  . ALLERGIC RHINITIS 08/16/2007  . HYPERLIPIDEMIA 01/24/2007  . OBESITY 01/24/2007  . ERECTILE DYSFUNCTION 01/24/2007  . HYPERTENSION 01/24/2007  . CORONARY ARTERY DISEASE 01/24/2007  . GERD 01/24/2007  . DIVERTICULOSIS, COLON 01/24/2007  . IRRITABLE BOWEL SYNDROME 01/24/2007  . COLONIC POLYPS, HX OF 01/24/2007  . CHOLECYSTECTOMY, HX OF 01/24/2007   Past Medical History:  Diagnosis Date  . Allergic rhinitis, cause unspecified   . Allergy   . Anxiety   . Arthritis   . Cataract   . Coronary atherosclerosis of unspecified type of vessel, native or graft    1985 , arthroplasty x 1   . Depression    PTSD   . Diverticulosis of colon (without mention of hemorrhage)   . Esophageal reflux   . Esophageal stricture   . Flushing   . Hemorrhoids   . Irritable bowel syndrome   . Myocardial infarction ([place])   . Nocturia   . Obesity, unspecified   . Other acquired absence of organ   . Other and unspecified hyperlipidemia   . Other malaise and fatigue   . Personal history of colonic polyps   . Psychosexual dysfunction with inhibited sexual excitement   . Type II or unspecified type diabetes mellitus without mention of complication, not stated as uncontrolled   . Unspecified essential hypertension     Family History  Problem Relation Age of Onset  . Lung cancer Father   . Coronary artery disease Mother   . Colon cancer Neg Hx   . Heart attack Neg Hx   . Hypertension Neg Hx   . Stroke Neg Hx   . Esophageal cancer Neg Hx   . Rectal cancer Neg Hx   . Stomach cancer Neg Hx   . Pancreatic cancer Neg Hx     Past Surgical History:  Procedure Laterality Date  . CARPAL TUNNEL RELEASE     left   . CHOLECYSTECTOMY    . COLON RESECTION     18 inches  .  COLON SURGERY  2013  . CORONARY ANGIOPLASTY     1985   . ELBOW BURSA SURGERY    . POLYPECTOMY    . SHOULDER SURGERY     Rt. Shoulder   Social History   Occupational History  . Lawns    Social History Main Topics  . Smoking status: Former Smoker    Quit date: 08/30/1983  . Smokeless tobacco: Former Systems developer     Comment: Quit 30 years ago.  . Alcohol use Yes     Comment: rare  . Drug use: No  . Sexual activity: Yes

## 2017-06-14 ENCOUNTER — Ambulatory Visit (INDEPENDENT_AMBULATORY_CARE_PROVIDER_SITE_OTHER): Payer: Medicare HMO | Admitting: Family Medicine

## 2017-06-14 DIAGNOSIS — E291 Testicular hypofunction: Secondary | ICD-10-CM

## 2017-06-28 ENCOUNTER — Ambulatory Visit (INDEPENDENT_AMBULATORY_CARE_PROVIDER_SITE_OTHER): Payer: Medicare HMO | Admitting: Family Medicine

## 2017-06-28 DIAGNOSIS — E291 Testicular hypofunction: Secondary | ICD-10-CM

## 2017-06-28 DIAGNOSIS — R7989 Other specified abnormal findings of blood chemistry: Secondary | ICD-10-CM

## 2017-07-08 ENCOUNTER — Emergency Department (HOSPITAL_COMMUNITY)
Admission: EM | Admit: 2017-07-08 | Discharge: 2017-07-08 | Disposition: A | Discharge status: Home / Self Care | Payer: Medicare HMO | Attending: Emergency Medicine | Admitting: Emergency Medicine

## 2017-07-08 ENCOUNTER — Encounter (HOSPITAL_COMMUNITY): Payer: Self-pay

## 2017-07-08 DIAGNOSIS — Z7982 Long term (current) use of aspirin: Secondary | ICD-10-CM | POA: Insufficient documentation

## 2017-07-08 DIAGNOSIS — Z955 Presence of coronary angioplasty implant and graft: Secondary | ICD-10-CM | POA: Insufficient documentation

## 2017-07-08 DIAGNOSIS — Z79899 Other long term (current) drug therapy: Secondary | ICD-10-CM | POA: Insufficient documentation

## 2017-07-08 DIAGNOSIS — R109 Unspecified abdominal pain: Secondary | ICD-10-CM | POA: Insufficient documentation

## 2017-07-08 DIAGNOSIS — E119 Type 2 diabetes mellitus without complications: Secondary | ICD-10-CM | POA: Diagnosis not present

## 2017-07-08 DIAGNOSIS — R1031 Right lower quadrant pain: Secondary | ICD-10-CM | POA: Diagnosis not present

## 2017-07-08 DIAGNOSIS — Z87891 Personal history of nicotine dependence: Secondary | ICD-10-CM | POA: Diagnosis not present

## 2017-07-08 DIAGNOSIS — I10 Essential (primary) hypertension: Secondary | ICD-10-CM | POA: Insufficient documentation

## 2017-07-08 DIAGNOSIS — I2510 Atherosclerotic heart disease of native coronary artery without angina pectoris: Secondary | ICD-10-CM | POA: Insufficient documentation

## 2017-07-08 LAB — CBC
HEMATOCRIT: 52.7 % — AB (ref 39.0–52.0)
HEMOGLOBIN: 18.2 g/dL — AB (ref 13.0–17.0)
MCH: 29.8 pg (ref 26.0–34.0)
MCHC: 34.5 g/dL (ref 30.0–36.0)
MCV: 86.4 fL (ref 78.0–100.0)
Platelets: 154 10*3/uL (ref 150–400)
RBC: 6.1 MIL/uL — AB (ref 4.22–5.81)
RDW: 14.3 % (ref 11.5–15.5)
WBC: 10.6 10*3/uL — AB (ref 4.0–10.5)

## 2017-07-08 LAB — URINALYSIS, ROUTINE W REFLEX MICROSCOPIC
BACTERIA UA: NONE SEEN
BILIRUBIN URINE: NEGATIVE
Glucose, UA: NEGATIVE mg/dL
HGB URINE DIPSTICK: NEGATIVE
KETONES UR: NEGATIVE mg/dL
NITRITE: NEGATIVE
Protein, ur: NEGATIVE mg/dL
SPECIFIC GRAVITY, URINE: 1.02 (ref 1.005–1.030)
pH: 5 (ref 5.0–8.0)

## 2017-07-08 LAB — COMPREHENSIVE METABOLIC PANEL
ALT: 30 U/L (ref 17–63)
ANION GAP: 11 (ref 5–15)
AST: 32 U/L (ref 15–41)
Albumin: 4.2 g/dL (ref 3.5–5.0)
Alkaline Phosphatase: 62 U/L (ref 38–126)
BILIRUBIN TOTAL: 0.6 mg/dL (ref 0.3–1.2)
BUN: 10 mg/dL (ref 6–20)
CHLORIDE: 99 mmol/L — AB (ref 101–111)
CO2: 26 mmol/L (ref 22–32)
Calcium: 10 mg/dL (ref 8.9–10.3)
Creatinine, Ser: 0.97 mg/dL (ref 0.61–1.24)
Glucose, Bld: 138 mg/dL — ABNORMAL HIGH (ref 65–99)
POTASSIUM: 4.1 mmol/L (ref 3.5–5.1)
Sodium: 136 mmol/L (ref 135–145)
TOTAL PROTEIN: 6.8 g/dL (ref 6.5–8.1)

## 2017-07-08 LAB — LIPASE, BLOOD: LIPASE: 26 U/L (ref 11–51)

## 2017-07-08 NOTE — ED Triage Notes (Signed)
States earlier today c/o right lower quad pain off and on states hx of diverticulitis no fever no n/v/d voiced. States now he has gas pains.

## 2017-07-08 NOTE — Discharge Instructions (Signed)
Please read and follow all provided instructions.  Your diagnoses today include:  1. Right lateral abdominal pain    Home care instructions:  Follow any educational materials contained in this packet. Drink lots of fluids. You most likely passed a kidney stone.   Follow-up instructions: Please follow-up with your primary care provider for further evaluation of symptoms and treatment   Return instructions:  Please return to the Emergency Department if you do not get better, if you get worse, or new symptoms OR  - Fever (temperature greater than 101.40F)  - Bleeding that does not stop with holding pressure to the area    -Severe pain (please note that you may be more sore the day after your accident)  - Chest Pain  - Difficulty breathing  - Severe nausea or vomiting  - Inability to tolerate food and liquids  - Passing out  - Skin becoming red around your wounds  - Change in mental status (confusion or lethargy)  - New numbness or weakness    Please return if you have any other emergent concerns.  Additional Information:  Your vital signs today were: BP (!) 146/89 (BP Location: Left Arm)    Pulse 87    Temp 98.1 F (36.7 C) (Oral)    Resp 18    Ht 5\' 9"  (1.753 m)    Wt 118.8 kg (262 lb)    SpO2 95%    BMI 38.69 kg/m  If your blood pressure (BP) was elevated above 135/85 this visit, please have this repeated by your doctor within one month. ---------------

## 2017-07-08 NOTE — ED Provider Notes (Signed)
[facility] DEPT Provider Note   CSN: [id number] Arrival date & time: 07/08/17  1805     History   Chief Complaint Chief Complaint  Patient presents with  . Abdominal Pain    HPI Cameron Daniel is a 71 y.o. male.  HPI  71 y.o. male with a hx of CAD, Diverticulosis, DM2, presents to the Emergency Department today due to RLQ abdominal pain that occurred earlier today. Pt states that he was concerned due to hx of Diverticulitis and decided to come to ED. Pt states that the pain lasted until 4-5pm and then disappeared when he was waiting in the ED. States pain was sudden and sharp with pain in right groin. Noted radiation into right flank. No chills. No fevers. No N/V/D. Pt thought it was gas pain and took gassex initially. Notes that his urine was "a little dark initially," but has since cleared up. No other symptoms noted.   Past Medical History:  Diagnosis Date  . Allergic rhinitis, cause unspecified   . Allergy   . Anxiety   . Arthritis   . Cataract   . Coronary atherosclerosis of unspecified type of vessel, native or graft    1985 , arthroplasty x 1   . Depression    PTSD   . Diverticulosis of colon (without mention of hemorrhage)   . Esophageal reflux   . Esophageal stricture   . Flushing   . Hemorrhoids   . Irritable bowel syndrome   . Myocardial infarction ([place])   . Nocturia   . Obesity, unspecified   . Other acquired absence of organ   . Other and unspecified hyperlipidemia   . Other malaise and fatigue   . Personal history of colonic polyps   . Psychosexual dysfunction with inhibited sexual excitement   . Type II or unspecified type diabetes mellitus without mention of complication, not stated as uncontrolled   . Unspecified essential hypertension     Patient Active Problem List   Diagnosis Date Noted  . Gout 04/06/2012  . DIVERTICULITIS-COLON 06/18/2010  . FACIAL FLUSHING 12/11/2007  . NOCTURIA 12/11/2007  . DIABETES  MELLITUS, TYPE II 09/07/2007  . OTHER MALAISE AND FATIGUE 09/07/2007  . ALLERGIC RHINITIS 08/16/2007  . HYPERLIPIDEMIA 01/24/2007  . OBESITY 01/24/2007  . ERECTILE DYSFUNCTION 01/24/2007  . HYPERTENSION 01/24/2007  . CORONARY ARTERY DISEASE 01/24/2007  . GERD 01/24/2007  . DIVERTICULOSIS, COLON 01/24/2007  . IRRITABLE BOWEL SYNDROME 01/24/2007  . COLONIC POLYPS, HX OF 01/24/2007  . CHOLECYSTECTOMY, HX OF 01/24/2007    Past Surgical History:  Procedure Laterality Date  . CARPAL TUNNEL RELEASE     left   . CHOLECYSTECTOMY    . COLON RESECTION     18 inches  . COLON SURGERY  2013  . CORONARY ANGIOPLASTY     1985   . ELBOW BURSA SURGERY    . POLYPECTOMY    . SHOULDER SURGERY     Rt. Shoulder       Home Medications    Prior to Admission medications   Medication Sig Start Date End Date Taking? Authorizing Provider  aspirin 81 MG tablet Take 81 mg by mouth every morning.     [provider]  Cholecalciferol (VITAMIN D3) 1000 units CAPS Take 1,000 Units by mouth daily.    [provider]  citalopram (CELEXA) 10 MG tablet Take 20 mg by mouth daily.     [provider]  colestipol (COLESTID) 5 g packet Take 5 g  by mouth 2 (two) times daily.    [provider]  fexofenadine (ALLEGRA) 180 MG tablet Take 180 mg by mouth daily as needed. For allergies    [provider]  finasteride (PROSCAR) 5 MG tablet Take 1 tablet (5 mg total) by mouth daily. 08/25/16   Cameron Frizzle, MD  fluticasone (FLONASE) 50 MCG/ACT nasal spray Place 1 spray into the nose daily.     [provider]  meloxicam (MOBIC) 15 MG tablet Take 15 mg by mouth daily.    [provider]  metroNIDAZOLE (FLAGYL) 250 MG tablet Take 1 tablet (250 mg total) by mouth 3 (three) times daily. Patient not taking: Reported on 06/08/2017 05/09/17   Cameron Shipper, MD  Multiple Vitamin (MULTI-VITAMINS) TABS Take 1 capsule by mouth daily.    [provider]    olmesartan (BENICAR) 20 MG tablet Take 1 tablet (20 mg total) by mouth daily. 02/24/17   Cameron Frizzle, MD  Olopatadine HCl (PATADAY) 0.2 % SOLN Apply 1 drop to eye daily as needed. 12/05/16   Dena Billet B, PA-C  pantoprazole (PROTONIX) 40 MG tablet TAKE 1 TABLET (40 MG TOTAL) BY MOUTH EVERY MORNING. 10/31/16   Cameron Frizzle, MD  pioglitazone (ACTOS) 15 MG tablet TAKE ONE TABLET BY MOUTH EVERY MORNING 06/05/17   Cameron Frizzle, MD  rosuvastatin (CRESTOR) 10 MG tablet TAKE 1 TABLET (10 MG TOTAL) BY MOUTH DAILY. 11/08/16   Cameron Frizzle, MD  testosterone cypionate (DEPOTESTOSTERONE CYPIONATE) 200 MG/ML injection Inject 1 mL (200 mg total) into the muscle every 14 (fourteen) days. 03/10/17   Cameron Frizzle, MD    Family History Family History  Problem Relation Age of Onset  . Lung cancer Father   . Coronary artery disease Mother   . Colon cancer Neg Hx   . Heart attack Neg Hx   . Hypertension Neg Hx   . Stroke Neg Hx   . Esophageal cancer Neg Hx   . Rectal cancer Neg Hx   . Stomach cancer Neg Hx   . Pancreatic cancer Neg Hx     Social History Social History   Tobacco Use  . Smoking status: Former Smoker    Last attempt to quit: 08/30/1983    Years since quitting: 33.8  . Smokeless tobacco: Former Systems developer  . Tobacco comment: Quit 30 years ago.  Substance Use Topics  . Alcohol use: Yes    Comment: rare  . Drug use: No     Allergies   Doxazosin mesylate; Doxazosin mesylate; Other; Flomax [tamsulosin hcl]; Jardiance [empagliflozin]; Penicillins; Sulfonamide derivatives; and Trulicity [dulaglutide]   Review of Systems Review of Systems ROS reviewed and all are negative for acute change except as noted in the HPI.  Physical Exam Updated Vital Signs BP (!) 146/89 (BP Location: Left Arm)   Pulse 87   Temp 98.1 F (36.7 C) (Oral)   Resp 18   Ht 5\' 9"  (1.753 m)   Wt 118.8 kg (262 lb)   SpO2 95%   BMI 38.69 kg/m   Physical Exam  Constitutional: He is oriented to  person, place, and time. Vital signs are normal. He appears well-developed and well-nourished. No distress.  HENT:  Head: Normocephalic and atraumatic.  Right Ear: Hearing, tympanic membrane, external ear and ear canal normal.  Left Ear: Hearing, tympanic membrane, external ear and ear canal normal.  Nose: Nose normal.  Mouth/Throat: Uvula is midline, oropharynx is clear and moist and mucous membranes are  normal. No trismus in the jaw. No oropharyngeal exudate, posterior oropharyngeal erythema or tonsillar abscesses.  Eyes: Conjunctivae and EOM are normal. Pupils are equal, round, and reactive to light.  Neck: Normal range of motion. Neck supple. No tracheal deviation present.  Cardiovascular: Normal rate, regular rhythm, S1 normal, S2 normal, normal heart sounds, intact distal pulses and normal pulses.  Pulmonary/Chest: Effort normal and breath sounds normal. No respiratory distress. He has no decreased breath sounds. He has no wheezes. He has no rhonchi. He has no rales.  Abdominal: Normal appearance and bowel sounds are normal. There is no tenderness. There is no rigidity, no rebound, no guarding, no CVA tenderness, no tenderness at McBurney's point and negative Murphy's sign.  Musculoskeletal: Normal range of motion.  Neurological: He is alert and oriented to person, place, and time.  Skin: Skin is warm and dry.  Psychiatric: He has a normal mood and affect. His speech is normal and behavior is normal. Thought content normal.  Nursing note and vitals reviewed.    ED Treatments / Results  Labs (all labs ordered are listed, but only abnormal results are displayed) Labs Reviewed  COMPREHENSIVE METABOLIC PANEL - Abnormal; Notable for the following components:      Result Value   Chloride 99 (*)    Glucose, Bld 138 (*)    All other components within normal limits  CBC - Abnormal; Notable for the following components:   WBC 10.6 (*)    RBC 6.10 (*)    Hemoglobin 18.2 (*)    HCT 52.7  (*)    All other components within normal limits  URINALYSIS, ROUTINE W REFLEX MICROSCOPIC - Abnormal; Notable for the following components:   Leukocytes, UA TRACE (*)    Squamous Epithelial / LPF 0-5 (*)    All other components within normal limits  LIPASE, BLOOD    EKG  EKG Interpretation None       Radiology No results found.  Procedures Procedures (including critical care time)  Medications Ordered in ED Medications - No data to display   Initial Impression / Assessment and Plan / ED Course  I have reviewed the triage vital signs and the nursing notes.  Pertinent labs & imaging results that were available during my care of the patient were reviewed by me and considered in my medical decision making (see chart for details).  Final Clinical Impressions(s) / ED Diagnoses  {I have reviewed and evaluated the relevant laboratory values.   {I have reviewed the relevant previous healthcare records.  {I obtained HPI from historian. {Patient discussed with supervising physician.  ED Course:  Assessment: Pt is a 71 y.o. male with a hx of CAD, Diverticulosis, DM2, presents to the Emergency Department today due to RLQ abdominal pain that occurred earlier today. Pt states that he was concerned due to hx of Diverticulitis and decided to come to ED. Pt states that the pain lasted until 4-5pm and then disappeared when he was waiting in the ED. States pain was sudden and sharp with pain in right groin. Noted radiation into right flank. No chills. No fevers. No N/V/D. Pt thought it was gas pain and took gassex initially. Notes that his urine was "a little dark initially," but has since cleared up. On exam, pt in NAD. Nontoxic/nonseptic appearing. VSS. Afebrile. Lungs CTA. Heart RRR. Abdomen nontender soft. Labs unremarkable. Symptoms consistent with recently passed stone as most likely diagnosis. Pt asymptomatic currently. Doubt appendicitis or diverticulitis given exam. Plan is to [place]  with  follow up to PCP. At time of discharge, Patient is in no acute distress. Vital Signs are stable. Patient is able to ambulate. Patient able to tolerate PO.   Disposition/Plan:  DC Home Additional Verbal discharge instructions given and discussed with patient.  Pt Instructed to f/u with PCP in the next week for evaluation and treatment of symptoms. Return precautions given Pt acknowledges and agrees with plan  Supervising Physician Lacretia Leigh, MD  Final diagnoses:  Right lateral abdominal pain    ED Discharge Orders    None       Shary Decamp, Hershal Coria 07/08/17 2222    Lacretia Leigh, MD 07/10/17 [phone]

## 2017-07-11 ENCOUNTER — Ambulatory Visit (INDEPENDENT_AMBULATORY_CARE_PROVIDER_SITE_OTHER): Payer: Medicare HMO | Admitting: Family Medicine

## 2017-07-11 DIAGNOSIS — R7989 Other specified abnormal findings of blood chemistry: Secondary | ICD-10-CM | POA: Diagnosis not present

## 2017-07-31 ENCOUNTER — Ambulatory Visit (INDEPENDENT_AMBULATORY_CARE_PROVIDER_SITE_OTHER): Payer: Medicare HMO | Admitting: Family Medicine

## 2017-07-31 DIAGNOSIS — E291 Testicular hypofunction: Secondary | ICD-10-CM

## 2017-07-31 DIAGNOSIS — R7989 Other specified abnormal findings of blood chemistry: Secondary | ICD-10-CM

## 2017-08-11 ENCOUNTER — Encounter: Payer: Self-pay | Admitting: Family Medicine

## 2017-08-15 ENCOUNTER — Encounter: Payer: Self-pay | Admitting: Family Medicine

## 2017-08-15 ENCOUNTER — Ambulatory Visit (INDEPENDENT_AMBULATORY_CARE_PROVIDER_SITE_OTHER): Payer: Medicare HMO

## 2017-08-15 DIAGNOSIS — Z23 Encounter for immunization: Secondary | ICD-10-CM

## 2017-08-15 DIAGNOSIS — E291 Testicular hypofunction: Secondary | ICD-10-CM

## 2017-08-15 NOTE — Progress Notes (Signed)
Patient was in the office to receive his shingrix vaccine as well as his testosterone injection. Patient received his shingrix vaccine in his left arm and  His testosterone injection in right gluteal. Patient tolerated well.

## 2017-08-28 DIAGNOSIS — E119 Type 2 diabetes mellitus without complications: Secondary | ICD-10-CM | POA: Diagnosis not present

## 2017-08-30 ENCOUNTER — Ambulatory Visit (INDEPENDENT_AMBULATORY_CARE_PROVIDER_SITE_OTHER): Payer: Medicare HMO | Admitting: Family Medicine

## 2017-08-30 DIAGNOSIS — E291 Testicular hypofunction: Secondary | ICD-10-CM

## 2017-09-03 ENCOUNTER — Other Ambulatory Visit: Payer: Self-pay | Admitting: Family Medicine

## 2017-09-11 ENCOUNTER — Other Ambulatory Visit: Payer: Self-pay | Admitting: Family Medicine

## 2017-09-11 NOTE — Telephone Encounter (Signed)
Ok to refill??  Last office visit 04/11/2017.  Last refill 03/10/2017, #5 refills.

## 2017-09-13 ENCOUNTER — Ambulatory Visit (INDEPENDENT_AMBULATORY_CARE_PROVIDER_SITE_OTHER): Payer: Medicare HMO | Admitting: Family Medicine

## 2017-09-13 DIAGNOSIS — E291 Testicular hypofunction: Secondary | ICD-10-CM | POA: Diagnosis not present

## 2017-09-13 DIAGNOSIS — R7989 Other specified abnormal findings of blood chemistry: Secondary | ICD-10-CM

## 2017-09-18 ENCOUNTER — Encounter: Payer: Self-pay | Admitting: Family Medicine

## 2017-09-18 ENCOUNTER — Ambulatory Visit (INDEPENDENT_AMBULATORY_CARE_PROVIDER_SITE_OTHER): Payer: Medicare HMO | Admitting: Family Medicine

## 2017-09-18 VITALS — BP 144/84 | HR 80 | Temp 98.0°F | Resp 16 | Ht 71.0 in | Wt 262.0 lb

## 2017-09-18 DIAGNOSIS — E291 Testicular hypofunction: Secondary | ICD-10-CM

## 2017-09-18 DIAGNOSIS — E118 Type 2 diabetes mellitus with unspecified complications: Secondary | ICD-10-CM

## 2017-09-18 MED ORDER — PREGABALIN 50 MG PO CAPS: 50.0000 mg | ORAL_CAPSULE | Freq: Three times a day (TID) | ORAL | 0 refills | Status: DC

## 2017-09-18 MED ORDER — OLOPATADINE HCL 0.2 % OP SOLN: 1.0000 [drp] | Freq: Every day | OPHTHALMIC | 5 refills | Status: DC | PRN

## 2017-09-18 NOTE — Progress Notes (Signed)
Subjective:    Patient ID: Cameron Daniel, male    DOB: [birth year], 72 y.o.   MRN: [id number]  HPI Patient has a history of diabetes mellitus type 2.  He is currently on Actos 15 mg a day. Easily his blood sugars have been fluctuating. Fasting blood sugars are averaging between 120 and 150. He denies any hypoglycemia. He has developed burning pain in both feet. Pain begins at the MTP joints in both feet and radiates into his toes. Pain is stabbing, burning, tingling. He can hurt whether he is walking or whether he is sleeping. There is no exacerbating or alleviating factors. He denies any polyuria. He denies any polydipsia. He is also due to check fasting lab work including a lipid panel. He is currently on testosterone replacement for hypogonadism which is working well for its far as his fatigue and libido. However he is due to recheck a testosterone level along with a PSA. Past Medical History:  Diagnosis Date  . Allergic rhinitis, cause unspecified   . Allergy   . Anxiety   . Arthritis   . Cataract   . Coronary atherosclerosis of unspecified type of vessel, native or graft    1985 , arthroplasty x 1   . Depression    PTSD   . Diverticulosis of colon (without mention of hemorrhage)   . Esophageal reflux   . Esophageal stricture   . Flushing   . Hemorrhoids   . Irritable bowel syndrome   . Myocardial infarction ([place])   . Nocturia   . Obesity, unspecified   . Other acquired absence of organ   . Other and unspecified hyperlipidemia   . Other malaise and fatigue   . Personal history of colonic polyps   . Psychosexual dysfunction with inhibited sexual excitement   . Type II or unspecified type diabetes mellitus without mention of complication, not stated as uncontrolled   . Unspecified essential hypertension    Past Surgical History:  Procedure Laterality Date  . CARPAL TUNNEL RELEASE     left   . CHOLECYSTECTOMY    . COLON RESECTION     18 inches  . COLON SURGERY  2013    . CORONARY ANGIOPLASTY     1985   . ELBOW BURSA SURGERY    . POLYPECTOMY    . SHOULDER SURGERY     Rt. Shoulder   Current Outpatient Medications on File Prior to Visit  Medication Sig Dispense Refill  . aspirin 81 MG tablet Take 81 mg by mouth every morning.     . Cholecalciferol (VITAMIN D3) 1000 units CAPS Take 1,000 Units by mouth daily.    . citalopram (CELEXA) 10 MG tablet Take 20 mg by mouth daily.     . colestipol (COLESTID) 5 g packet Take 5 g by mouth 2 (two) times daily.    . fexofenadine (ALLEGRA) 180 MG tablet Take 180 mg by mouth daily as needed. For allergies    . finasteride (PROSCAR) 5 MG tablet Take 1 tablet (5 mg total) by mouth daily. 90 tablet 3  . fluticasone (FLONASE) 50 MCG/ACT nasal spray Place 1 spray into the nose daily.     . Multiple Vitamin (MULTI-VITAMINS) TABS Take 1 capsule by mouth daily.    Marland Kitchen olmesartan (BENICAR) 20 MG tablet Take 1 tablet (20 mg total) by mouth daily. 30 tablet 3  . pantoprazole (PROTONIX) 40 MG tablet TAKE 1 TABLET (40 MG TOTAL) BY MOUTH EVERY MORNING. 90 tablet 3  .  pioglitazone (ACTOS) 15 MG tablet TAKE 1 TABLET BY MOUTH EVERY DAY IN THE MORNING 90 tablet 2  . rosuvastatin (CRESTOR) 10 MG tablet TAKE 1 TABLET (10 MG TOTAL) BY MOUTH DAILY. 90 tablet 3  . meloxicam (MOBIC) 15 MG tablet Take 15 mg by mouth daily.     Current Facility-Administered Medications on File Prior to Visit  Medication Dose Route Frequency Provider Last Rate Last Dose  . 0.9 %  sodium chloride infusion  500 mL Intravenous Continuous Irene Shipper, MD      . testosterone cypionate (DEPOTESTOSTERONE CYPIONATE) injection 200 mg  200 mg Intramuscular Q14 Days [name], MD   200 mg at 09/13/17 1539   Allergies  Allergen Reactions  . Doxazosin Mesylate     Reaction=unknown  . Doxazosin Mesylate Other (See Comments)  . Other Other (See Comments)  . Flomax [Tamsulosin Hcl]     Itching   . Jardiance [Empagliflozin] Other (See Comments)    Nausea,  dizziness - Severe (per pt)  . Penicillins Rash and Other (See Comments)    Has patient had a PCN reaction causing immediate rash, facial/tongue/throat swelling, SOB or lightheadedness with hypotension: No Has patient had a PCN reaction causing severe rash involving mucus membranes or skin necrosis: No Has patient had a PCN reaction that required hospitalization No Has patient had a PCN reaction occurring within the last 10 years: No If all of the above answers are "NO", then may proceed with Cephalosporin use.   . Sulfonamide Derivatives Rash  . Trulicity [Dulaglutide] Nausea And Vomiting    N&V, Dizziness   Social History   Socioeconomic History  . Marital status: Married    Spouse name: Not on file  . Number of children: 1  . Years of education: Not on file  . Highest education level: Not on file  Social Needs  . Financial resource strain: Not on file  . Food insecurity - worry: Not on file  . Food insecurity - inability: Not on file  . Transportation needs - medical: Not on file  . Transportation needs - non-medical: Not on file  Occupational History  . 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 Smoking status: Former Smoker    Last attempt to quit: 08/30/1983    Years since quitting: 34.0  . Smokeless tobacco: Former Systems developer  . Tobacco comment: Quit 30 years ago.  Substance and Sexual Activity  . Alcohol use: Yes    Comment: rare  . Drug use: No  . Sexual activity: Yes  Other Topics Concern  . Not on file  Social History Narrative  . Not on file      Review of Systems  All other systems reviewed and are negative.      Objective:   Physical Exam  Constitutional: He appears well-developed and well-nourished.  Cardiovascular: Normal rate, regular rhythm and normal heart sounds.  Pulmonary/Chest: Effort normal and breath sounds normal. No respiratory distress. He has no wheezes. He has no rales.  Abdominal: Soft. Bowel sounds are normal.  Vitals reviewed.           Assessment & Plan:  Controlled type 2 diabetes mellitus with complication, without long-term current use of insulin (HCC) - Plan: pregabalin (LYRICA) 50 MG capsule, Hemoglobin A1c, Microalbumin, urine, Lipid panel  Hypogonadism in male - Plan: PSA, Testosterone Total,Free,Bio, Males  History sounds like diabetic neuropathy rather than metatarsalgia. Patient has tried gabapentin but could not tolerate it due to altered mental status. He would  like to try low-dose Lyrica 50 mg by mouth 3 times a day when necessary nerve pain as a rescue to return fasting so that I can check a fasting lipid panel, hemoglobin A1c, and a urine microalbumin. Goal hemoglobin A1c is less than 7. Goal LDL cholesterol is less than 70. I'll checking lab work, I would also check a testosterone level along with a PSA

## 2017-09-19 ENCOUNTER — Other Ambulatory Visit: Payer: Medicare HMO

## 2017-09-19 DIAGNOSIS — E119 Type 2 diabetes mellitus without complications: Secondary | ICD-10-CM | POA: Diagnosis not present

## 2017-09-19 DIAGNOSIS — I10 Essential (primary) hypertension: Secondary | ICD-10-CM

## 2017-09-19 DIAGNOSIS — Z79899 Other long term (current) drug therapy: Secondary | ICD-10-CM | POA: Diagnosis not present

## 2017-09-19 DIAGNOSIS — E785 Hyperlipidemia, unspecified: Secondary | ICD-10-CM | POA: Diagnosis not present

## 2017-09-20 LAB — LIPID PANEL
CHOL/HDL RATIO: 3.3 (calc) (ref <5.0)
CHOLESTEROL: 156 mg/dL (ref <200)
HDL: 48 mg/dL (ref >40)
LDL CHOLESTEROL (CALC): 64 mg/dL
NON-HDL CHOLESTEROL (CALC): 108 mg/dL (ref <130)
TRIGLYCERIDES: 367 mg/dL — AB (ref <150)

## 2017-09-20 LAB — CBC WITH DIFFERENTIAL/PLATELET
BASOS PCT: 0.6 %
Basophils Absolute: 38 cells/uL (ref 0–200)
EOS PCT: 1.3 %
Eosinophils Absolute: 83 cells/uL (ref 15–500)
HEMATOCRIT: 52.1 % — AB (ref 38.5–50.0)
HEMOGLOBIN: 17.6 g/dL — AB (ref 13.2–17.1)
LYMPHS ABS: 1997 {cells}/uL (ref 850–3900)
MCH: 28.5 pg (ref 27.0–33.0)
MCHC: 33.8 g/dL (ref 32.0–36.0)
MCV: 84.3 fL (ref 80.0–100.0)
MPV: 10 fL (ref 7.5–12.5)
Monocytes Relative: 7.4 %
NEUTROS ABS: 3808 {cells}/uL (ref 1500–7800)
Neutrophils Relative %: 59.5 %
Platelets: 155 10*3/uL (ref 140–400)
RBC: 6.18 10*6/uL — ABNORMAL HIGH (ref 4.20–5.80)
RDW: 16.6 % — ABNORMAL HIGH (ref 11.0–15.0)
Total Lymphocyte: 31.2 %
WBC mixed population: 474 cells/uL (ref 200–950)
WBC: 6.4 10*3/uL (ref 3.8–10.8)

## 2017-09-20 LAB — HEMOGLOBIN A1C
Hgb A1c MFr Bld: 7.9 % of total Hgb — ABNORMAL HIGH (ref <5.7)
MEAN PLASMA GLUCOSE: 180 (calc)
eAG (mmol/L): 10 (calc)

## 2017-09-20 LAB — COMPREHENSIVE METABOLIC PANEL
AG RATIO: 2 (calc) (ref 1.0–2.5)
ALT: 22 U/L (ref 9–46)
AST: 22 U/L (ref 10–35)
Albumin: 4.2 g/dL (ref 3.6–5.1)
Alkaline phosphatase (APISO): 65 U/L (ref 40–115)
BUN: 14 mg/dL (ref 7–25)
CALCIUM: 9.9 mg/dL (ref 8.6–10.3)
CO2: 29 mmol/L (ref 20–32)
Chloride: 98 mmol/L (ref 98–110)
Creat: 1.08 mg/dL (ref 0.70–1.18)
Globulin: 2.1 g/dL (calc) (ref 1.9–3.7)
Glucose, Bld: 194 mg/dL — ABNORMAL HIGH (ref 65–99)
Potassium: 4.7 mmol/L (ref 3.5–5.3)
Sodium: 135 mmol/L (ref 135–146)
Total Bilirubin: 0.8 mg/dL (ref 0.2–1.2)
Total Protein: 6.3 g/dL (ref 6.1–8.1)

## 2017-09-25 ENCOUNTER — Ambulatory Visit (INDEPENDENT_AMBULATORY_CARE_PROVIDER_SITE_OTHER): Payer: Medicare HMO | Admitting: Family Medicine

## 2017-09-25 DIAGNOSIS — R7989 Other specified abnormal findings of blood chemistry: Secondary | ICD-10-CM | POA: Diagnosis not present

## 2017-10-05 ENCOUNTER — Other Ambulatory Visit: Payer: Self-pay | Admitting: Family Medicine

## 2017-10-23 DIAGNOSIS — H02876 Vascular anomalies of left eye, unspecified eyelid: Secondary | ICD-10-CM | POA: Diagnosis not present

## 2017-10-25 DIAGNOSIS — D485 Neoplasm of uncertain behavior of skin: Secondary | ICD-10-CM | POA: Diagnosis not present

## 2017-10-25 DIAGNOSIS — C44111 Basal cell carcinoma of skin of unspecified eyelid, including canthus: Secondary | ICD-10-CM | POA: Diagnosis not present

## 2017-10-27 ENCOUNTER — Ambulatory Visit (INDEPENDENT_AMBULATORY_CARE_PROVIDER_SITE_OTHER): Payer: Medicare HMO

## 2017-10-27 DIAGNOSIS — R7989 Other specified abnormal findings of blood chemistry: Secondary | ICD-10-CM

## 2017-10-27 DIAGNOSIS — E291 Testicular hypofunction: Secondary | ICD-10-CM

## 2017-10-27 NOTE — Progress Notes (Signed)
Patient was in office for testosterone injection. Patient received injection in left ventrogluteal. Patient tolerated well  

## 2017-11-03 ENCOUNTER — Ambulatory Visit: Payer: Medicare HMO | Admitting: Cardiology

## 2017-11-03 ENCOUNTER — Encounter: Payer: Self-pay | Admitting: Cardiology

## 2017-11-03 VITALS — BP 140/88 | HR 89 | Ht 71.0 in | Wt 259.0 lb

## 2017-11-03 DIAGNOSIS — I10 Essential (primary) hypertension: Secondary | ICD-10-CM | POA: Diagnosis not present

## 2017-11-03 DIAGNOSIS — I2510 Atherosclerotic heart disease of native coronary artery without angina pectoris: Secondary | ICD-10-CM | POA: Diagnosis not present

## 2017-11-03 DIAGNOSIS — E7849 Other hyperlipidemia: Secondary | ICD-10-CM

## 2017-11-03 MED ORDER — OMEGA-3-ACID ETHYL ESTERS 1 G PO CAPS: 1.0000 g | ORAL_CAPSULE | Freq: Every day | ORAL | 2 refills | Status: DC

## 2017-11-03 NOTE — Progress Notes (Signed)
Patient ID: [name], male   DOB: 04-Mar-1946, 72 y.o.   MRN: [id number]    [name] Date of Encounter: 11/03/2017  Primary Care Provider:  [name], MD Primary Cardiologist:  [name], MD   Patient Profile Re-establish cardiology care  Problem List   Past Medical History:  Diagnosis Date  . Allergic rhinitis, cause unspecified   . Allergy   . Anxiety   . Arthritis   . Cataract   . Coronary atherosclerosis of unspecified type of vessel, native or graft    1985 , arthroplasty x 1   . Depression    PTSD   . Diverticulosis of colon (without mention of hemorrhage)   . Esophageal reflux   . Esophageal stricture   . Flushing   . Hemorrhoids   . Irritable bowel syndrome   . Myocardial infarction ([place])   . Nocturia   . Obesity, unspecified   . Other acquired absence of organ   . Other and unspecified hyperlipidemia   . Other malaise and fatigue   . Personal history of colonic polyps   . Psychosexual dysfunction with inhibited sexual excitement   . Type II or unspecified type diabetes mellitus without mention of complication, not stated as uncontrolled   . Unspecified essential hypertension    Past Surgical History:  Procedure Laterality Date  . CARPAL TUNNEL RELEASE     left   . CHOLECYSTECTOMY    . COLON RESECTION     18 inches  . COLON SURGERY  2013  . CORONARY ANGIOPLASTY     1985   . ELBOW BURSA SURGERY    . POLYPECTOMY    . SHOULDER SURGERY     Rt. Shoulder   Allergies  Allergies  Allergen Reactions  . Doxazosin Mesylate     Reaction=unknown  . Doxazosin Mesylate Other (See Comments)  . Other Other (See Comments)  . Flomax [Tamsulosin Hcl]     Itching   . Jardiance [Empagliflozin] Other (See Comments)    Nausea, dizziness - Severe (per pt)  . Penicillins Rash and Other (See Comments)    Has patient had a PCN reaction causing immediate rash, facial/tongue/throat swelling, SOB or lightheadedness with  hypotension: No Has patient had a PCN reaction causing severe rash involving mucus membranes or skin necrosis: No Has patient had a PCN reaction that required hospitalization No Has patient had a PCN reaction occurring within the last 10 years: No If all of the above answers are "NO", then may proceed with Cephalosporin use.   . Sulfonamide Derivatives Rash  . Trulicity [Dulaglutide] Nausea And Vomiting    N&V, Dizziness    HPI  72 year old male with h/o MI in 1985, HTN, hyperlipidemia, NIDDM, former smoking who is a former patient of [name]. He was lost for follow up since 2009 when he lost his insurance and was followed at [place]. He denies any CP, SOB, dizziness, palpitations, syncope. He had a nuclear stress test 3 years ago that was unchanged from prior (per patient) and no intervention was done. 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   07/01/2016 - 1 year follow up, He feels great he continues to work as a Development worker, international aid, and denies any chest pain or shortness of breath, he has balance issue that he attributes to his diabetic neuropathy. He has been having hard time with diabetic medications as he develop recurrent yeast infection. Most recent hemoglobin A1c 6.9%. July  2017 HDL and LDL at goal however elevated triglycerides. In the past he was intolerant to Lipitor but is tolerating Crestor very well. He denies any lower extremity edema no orthopnea proximal nocturnal dyspnea.  11/03/2017 - this is one year follow-up, the patient feels and looks great, he denies any chest pain shortness of breath, he gets orthostatic hypotension when he gets up but no falls or syncope. Is compliant with his meds and tolerates them well. He denies any claudication, his main concern is diabetic neuropathy with decreased sensation in his fingers. He's had no palpitations. He tolerates the medications well.   Home Medications  Prior to Admission medications   Medication Sig  Start Date End Date Taking? Authorizing Provider  aspirin 81 MG tablet Take 81 mg by mouth every morning.    Yes Historical Provider, MD  cholecalciferol (VITAMIN D) 1000 UNITS tablet Take 2,000 Units by mouth daily.   Yes Historical Provider, MD  citalopram (CELEXA) 10 MG tablet Take 20 mg by mouth daily.    Yes Historical Provider, MD  colestipol (COLESTID) 5 G granules Take 5 g by mouth daily.   Yes Historical Provider, MD  fexofenadine (ALLEGRA) 180 MG tablet Take 180 mg by mouth daily as needed. For allergies   Yes Historical Provider, MD  fish oil-omega-3 fatty acids 1000 MG capsule Take 500 mg by mouth 2 (two) times daily.   Yes Historical Provider, MD  meloxicam (MOBIC) 15 MG tablet Take 1 tablet (15 mg total) by mouth daily. 04/25/13  Yes [name], MD  montelukast (SINGULAIR) 10 MG tablet Take 1 tablet (10 mg total) by mouth at bedtime. 12/20/12  Yes [name], MD  Multiple Vitamin (MULTIVITAMIN) tablet Take 1 tablet by mouth daily.    Yes Historical Provider, MD  olmesartan (BENICAR) 20 MG tablet Take 20 mg by mouth every morning.   Yes Historical Provider, MD  pantoprazole (PROTONIX) 40 MG tablet Take 40 mg by mouth every morning.    Yes Historical Provider, MD  PATADAY 0.2 % SOLN INSTILL ONE DROP INTO EACH EYE DAILY AS NEEDED FOR ITCH. 01/06/13  Yes [name], MD  pioglitazone (ACTOS) 15 MG tablet Take 15 mg by mouth every morning.    Yes Historical Provider, MD  polyethylene glycol powder (GLYCOLAX/MIRALAX) powder Take 17 g by mouth daily. 03/12/13  Yes Jasper Riling. Pickering, MD  simvastatin (ZOCOR) 20 MG tablet Take 30 mg by mouth every evening.   Yes Historical Provider, MD  Testosterone (ANDROGEL) 20.25 MG/1.25GM (1.62%) GEL Place onto the skin daily. 2 pumps daily   Yes Historical Provider, MD  triamcinolone (NASACORT) 55 MCG/ACT nasal inhaler Place 2 sprays into the nose daily.   Yes Historical Provider, MD    Family History  Family History  Problem Relation  Age of Onset  . Lung cancer Father   . Coronary artery disease Mother   . Colon cancer Neg Hx   . Heart attack Neg Hx   . Hypertension Neg Hx   . Stroke Neg Hx   . Esophageal cancer Neg Hx   . Rectal cancer Neg Hx   . Stomach cancer Neg Hx   . Pancreatic cancer Neg Hx     Social History  Social History   Socioeconomic History  . Marital status: Married    Spouse name: Not on file  . Number of children: 1  . Years of education: Not on file  . Highest education level: Not on file  Social Needs  .  Financial resource strain: Not on file  . Food insecurity - worry: Not on file  . Food insecurity - inability: Not on file  . Transportation needs - medical: Not on file  . Transportation needs - non-medical: Not on file  Occupational History  . 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 Smoking status: Former Smoker    Last attempt to quit: 08/30/1983    Years since quitting: 34.2  . Smokeless tobacco: Former Systems developer  . Tobacco comment: Quit 30 years ago.  Substance and Sexual Activity  . Alcohol use: Yes    Comment: rare  . Drug use: No  . Sexual activity: Yes  Other Topics Concern  . Not on file  Social History Narrative  . Not on file     Review of Systems General:  No chills, fever, night sweats or weight changes.  Cardiovascular:  No chest pain, dyspnea on exertion, edema, orthopnea, palpitations, paroxysmal nocturnal dyspnea. Dermatological: No rash, lesions/masses Respiratory: No cough, dyspnea Urologic: No hematuria, dysuria Abdominal:   No nausea, vomiting, diarrhea, bright red blood per rectum, melena, or hematemesis Neurologic:  No visual changes, wkns, changes in mental status. All other systems reviewed and are otherwise negative except as noted above.  Physical Exam  Blood pressure 140/88, pulse 89, height 5\' 11"  (1.803 m), weight 259 lb (117.5 kg), SpO2 95 %.  General: Pleasant, NAD Psych: Normal affect. Neuro: Alert and oriented X 3. Moves all extremities  spontaneously. HEENT: Normal  Neck: Supple without bruits or JVD. Lungs:  Resp regular and unlabored, CTA. Heart: RRR no s3, s4, or murmurs. Abdomen: Soft, non-tender, non-distended, BS + x 4.  Extremities: No clubbing, cyanosis or edema. DP/PT/Radials 2+ and equal bilaterally.  Accessory Clinical Findings  ECG - performed today 11/03/2016 to normal rhythm, axis deviation, inferior infarct age undetermined, unchanged from prior, this was personally reviewed.    Assessment & Plan  72 year old male who was followed in our clinic for years by Dr Lia Foyer and came back for re-establishing cardiology care  1. CAD - currently asymptomatic, we will continue with Aspirin, BB, ARB, statin, no ischemic workup needed. EKG is unchanged from prior.  2. Hypertension - borderline today, however orthostatic hypotension at home to continue same management.   3. Hyperlipidemia LDL control, similarly elevated triglycerides, he admits to eating ice cream regularly, we wil start Lovaza 1 g daily.  4. NIDDM - elevated HbA1c - the patient will follow with PCP  Follow up in 1 year.   [name], MD 11/03/2017, 4:21 PM

## 2017-11-03 NOTE — Patient Instructions (Signed)
Medication Instructions:   START TAKING LOVAZA 1 GRAM BY MOUTH DAILY    Follow-Up:  Your physician wants you to follow-up in: [place] will receive a reminder letter in the mail two months in advance. If you don't receive a letter, please call our office to schedule the follow-up appointment.        If you need a refill on your cardiac medications before your next appointment, please call your pharmacy.

## 2017-11-14 ENCOUNTER — Ambulatory Visit (INDEPENDENT_AMBULATORY_CARE_PROVIDER_SITE_OTHER): Payer: Medicare HMO

## 2017-11-14 DIAGNOSIS — E291 Testicular hypofunction: Secondary | ICD-10-CM

## 2017-11-14 DIAGNOSIS — R7989 Other specified abnormal findings of blood chemistry: Secondary | ICD-10-CM

## 2017-11-14 NOTE — Progress Notes (Signed)
Patient was in office for testosterone injection. Patient received injection in right gluteal. Patient tolerated well

## 2017-11-15 ENCOUNTER — Other Ambulatory Visit: Payer: Self-pay | Admitting: Family Medicine

## 2017-11-30 DIAGNOSIS — E119 Type 2 diabetes mellitus without complications: Secondary | ICD-10-CM | POA: Diagnosis not present

## 2017-12-01 ENCOUNTER — Ambulatory Visit (INDEPENDENT_AMBULATORY_CARE_PROVIDER_SITE_OTHER): Payer: Medicare HMO | Admitting: Family Medicine

## 2017-12-01 DIAGNOSIS — E291 Testicular hypofunction: Secondary | ICD-10-CM

## 2017-12-05 ENCOUNTER — Encounter (INDEPENDENT_AMBULATORY_CARE_PROVIDER_SITE_OTHER): Payer: Self-pay | Admitting: Orthopaedic Surgery

## 2017-12-05 ENCOUNTER — Ambulatory Visit (INDEPENDENT_AMBULATORY_CARE_PROVIDER_SITE_OTHER): Payer: Medicare HMO

## 2017-12-05 ENCOUNTER — Encounter (INDEPENDENT_AMBULATORY_CARE_PROVIDER_SITE_OTHER): Payer: Self-pay

## 2017-12-05 ENCOUNTER — Ambulatory Visit (INDEPENDENT_AMBULATORY_CARE_PROVIDER_SITE_OTHER): Payer: Medicare HMO | Admitting: Orthopaedic Surgery

## 2017-12-05 VITALS — BP 144/88 | HR 87 | Resp 16 | Ht 71.0 in | Wt 250.0 lb

## 2017-12-05 DIAGNOSIS — G8929 Other chronic pain: Secondary | ICD-10-CM | POA: Diagnosis not present

## 2017-12-05 DIAGNOSIS — M25551 Pain in right hip: Secondary | ICD-10-CM

## 2017-12-05 DIAGNOSIS — M25561 Pain in right knee: Secondary | ICD-10-CM | POA: Diagnosis not present

## 2017-12-05 MED ORDER — LIDOCAINE HCL 1 % IJ SOLN
2.0000 mL | INTRAMUSCULAR | Status: AC | PRN
  Administered 2017-12-05: 2 mL

## 2017-12-05 MED ORDER — METHYLPREDNISOLONE ACETATE 40 MG/ML IJ SUSP
80.0000 mg | INTRAMUSCULAR | Status: AC | PRN
  Administered 2017-12-05: 80 mg

## 2017-12-05 MED ORDER — BUPIVACAINE HCL 0.5 % IJ SOLN
2.0000 mL | INTRAMUSCULAR | Status: AC | PRN
  Administered 2017-12-05: 2 mL via INTRA_ARTICULAR

## 2017-12-05 NOTE — Progress Notes (Signed)
Office Visit Note   Patient: Cameron Daniel           Date of Birth: 1946/05/15           MRN: [id number] Visit Date: 12/05/2017              Requested by: [name], MD [address] [facility] [place], [place] [address] PCP: [name], MD   Assessment & Plan: Visit Diagnoses:  1. Pain of right hip joint     Plan: Not sure the origin of his right leg pain.  Could be referred from his hip.  Could be related to his right knee.  Will inject his right knee with cortisone along the lateral joint and monitor his response.  Does not appear to have any significant back etiology Follow-Up Instructions: No follow-ups on file.   Orders:  Orders Placed This Encounter  Procedures  . XR Pelvis 1-2 Views   No orders of the defined types were placed in this encounter.     Procedures: Large Joint Inj: R knee on 12/05/2017 4:47 PM Indications: pain and diagnostic evaluation Details: 25 G 1.5 in needle, anteromedial approach  Arthrogram: No  Medications: 2 mL bupivacaine 0.5 %; 2 mL lidocaine 1 %; 80 mg methylPREDNISolone acetate 40 MG/ML Procedure, treatment alternatives, risks and benefits explained, specific risks discussed. Consent was given by the patient. Immediately prior to procedure a time out was called to verify the correct patient, procedure, equipment, support staff and site/side marked as required. Patient was prepped and draped in the usual sterile fashion.       Clinical Data: No additional findings.   Subjective: Chief Complaint  Patient presents with  . Right Knee - Pain  . New Patient (Initial Visit)    R KNEE PAIN FOR STARTED 4-5 MONTHS STARTED IN HAMSTRING AND DOWN BACK AND SIDE OF KNEE  [name] has had some recurrent episodes of pain along the posterior aspect of his right thigh radiating to the posterior lateral aspect of his right knee and even into the calf of his leg.  It almost feels like a "spasm" not had any specific pain in his knee.   No history of injury or trauma.  No swelling.  It has been a problem for him when he is active and even sitting on a lawnmower. has had some minor problem with his back but no specific back pain.  Had x-rays of his right knee in October 2018.  There was minimal decrease in the medial joint space and no ectopic calcification.  Films were consistent with mild osteoarthritis.  No numbness or tingling  HPI  Review of Systems  Constitutional: Negative for fatigue.  HENT: Negative for ear pain.   Eyes: Negative for pain.  Respiratory: Negative for cough and shortness of breath.   Cardiovascular: Positive for leg swelling.  Gastrointestinal: Negative for constipation and diarrhea.  Genitourinary: Negative for difficulty urinating.  Musculoskeletal: Positive for back pain. Negative for neck pain.  Skin: Negative for rash.  Allergic/Immunologic: Negative for food allergies.  Neurological: Positive for numbness. Negative for weakness, light-headedness and headaches.  Hematological: Bruises/bleeds easily.  Psychiatric/Behavioral: Negative for sleep disturbance.     Objective: Vital Signs: BP (!) 144/88 (BP Location: Left Arm, Patient Position: Sitting, Cuff Size: Normal)   Pulse 87   Resp 16   Ht 5\' 11"  (1.803 m)   Wt 250 lb (113.4 kg)   BMI 34.87 kg/m   Physical Exam  Ortho Exam awake alert and oriented x3.  Comfortable sitting.  Straight leg raise negative.  For any back pain.  Does have a little bit of hamstring discomfort to palpation but no masses.  Right knee was not effused nor hot.  No significant medial lateral joint pain.  Does have some mild lateral joint pain.  Just no evidence of instability.  Full flexion and extension.  No calf pain.  Good capillary refill to his right foot.  Does have some limitation of of right hip motion with internal rotation compared to the left  Specialty Comments:  No specialty comments available.  Imaging: No results found.   PMFS  History: Patient Active Problem List   Diagnosis Date Noted  . Gout 04/06/2012  . DIVERTICULITIS-COLON 06/18/2010  . FACIAL FLUSHING 12/11/2007  . NOCTURIA 12/11/2007  . DIABETES MELLITUS, TYPE II 09/07/2007  . OTHER MALAISE AND FATIGUE 09/07/2007  . ALLERGIC RHINITIS 08/16/2007  . HYPERLIPIDEMIA 01/24/2007  . OBESITY 01/24/2007  . ERECTILE DYSFUNCTION 01/24/2007  . HYPERTENSION 01/24/2007  . CORONARY ARTERY DISEASE 01/24/2007  . GERD 01/24/2007  . DIVERTICULOSIS, COLON 01/24/2007  . IRRITABLE BOWEL SYNDROME 01/24/2007  . COLONIC POLYPS, HX OF 01/24/2007  . CHOLECYSTECTOMY, HX OF 01/24/2007   Past Medical History:  Diagnosis Date  . Allergic rhinitis, cause unspecified   . Allergy   . Anxiety   . Arthritis   . Cancer ([place])   . Cataract   . Coronary atherosclerosis of unspecified type of vessel, native or graft    1985 , arthroplasty x 1   . Depression    PTSD   . Diverticulosis of colon (without mention of hemorrhage)   . Esophageal reflux   . Esophageal stricture   . Flushing   . Hemorrhoids   . Irritable bowel syndrome   . Myocardial infarction ([place])   . Nocturia   . Obesity, unspecified   . Other acquired absence of organ   . Other and unspecified hyperlipidemia   . Other malaise and fatigue   . Personal history of colonic polyps   . Psychosexual dysfunction with inhibited sexual excitement   . Type II or unspecified type diabetes mellitus without mention of complication, not stated as uncontrolled   . Unspecified essential hypertension     Family History  Problem Relation Age of Onset  . Lung cancer Father   . Coronary artery disease Mother   . Colon cancer Neg Hx   . Heart attack Neg Hx   . Hypertension Neg Hx   . Stroke Neg Hx   . Esophageal cancer Neg Hx   . Rectal cancer Neg Hx   . Stomach cancer Neg Hx   . Pancreatic cancer Neg Hx     Past Surgical History:  Procedure Laterality Date  . CARPAL TUNNEL RELEASE     left   . CHOLECYSTECTOMY     . COLON RESECTION     18 inches  . COLON SURGERY  2013  . CORONARY ANGIOPLASTY     1985   . ELBOW BURSA SURGERY    . EYE SURGERY    . POLYPECTOMY    . SHOULDER SURGERY     Rt. Shoulder   Social History   Occupational History  . 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 Smoking status: Former Smoker    Last attempt to quit: 08/30/1983    Years since quitting: 34.2  . Smokeless tobacco: Former Systems developer  . Tobacco comment: Quit 30 years ago.  Substance and  Sexual Activity  . Alcohol use: Yes    Comment: rare  . Drug use: No  . Sexual activity: Yes

## 2017-12-20 DIAGNOSIS — H0289 Other specified disorders of eyelid: Secondary | ICD-10-CM | POA: Diagnosis not present

## 2017-12-20 DIAGNOSIS — Z01818 Encounter for other preprocedural examination: Secondary | ICD-10-CM | POA: Diagnosis not present

## 2017-12-20 DIAGNOSIS — H5789 Other specified disorders of eye and adnexa: Secondary | ICD-10-CM | POA: Diagnosis not present

## 2017-12-20 DIAGNOSIS — C441192 Basal cell carcinoma of skin of left lower eyelid, including canthus: Secondary | ICD-10-CM | POA: Diagnosis not present

## 2017-12-20 DIAGNOSIS — Z481 Encounter for planned postprocedural wound closure: Secondary | ICD-10-CM | POA: Diagnosis not present

## 2017-12-20 DIAGNOSIS — D485 Neoplasm of uncertain behavior of skin: Secondary | ICD-10-CM | POA: Diagnosis not present

## 2017-12-20 DIAGNOSIS — E119 Type 2 diabetes mellitus without complications: Secondary | ICD-10-CM | POA: Diagnosis not present

## 2017-12-20 DIAGNOSIS — Z85828 Personal history of other malignant neoplasm of skin: Secondary | ICD-10-CM | POA: Diagnosis not present

## 2017-12-22 ENCOUNTER — Ambulatory Visit (INDEPENDENT_AMBULATORY_CARE_PROVIDER_SITE_OTHER): Payer: Non-veteran care | Admitting: Family Medicine

## 2017-12-22 DIAGNOSIS — E291 Testicular hypofunction: Secondary | ICD-10-CM | POA: Diagnosis not present

## 2017-12-22 DIAGNOSIS — R7989 Other specified abnormal findings of blood chemistry: Secondary | ICD-10-CM | POA: Diagnosis not present

## 2018-01-12 ENCOUNTER — Other Ambulatory Visit: Payer: Self-pay | Admitting: Family Medicine

## 2018-01-12 MED ORDER — GLIPIZIDE 5 MG PO TABS: 5.0000 mg | ORAL_TABLET | Freq: Every day | ORAL | 1 refills | Status: DC

## 2018-01-16 ENCOUNTER — Ambulatory Visit (INDEPENDENT_AMBULATORY_CARE_PROVIDER_SITE_OTHER): Payer: Medicare HMO

## 2018-01-16 DIAGNOSIS — E291 Testicular hypofunction: Secondary | ICD-10-CM | POA: Diagnosis not present

## 2018-01-23 ENCOUNTER — Ambulatory Visit (INDEPENDENT_AMBULATORY_CARE_PROVIDER_SITE_OTHER): Payer: PRIVATE HEALTH INSURANCE | Admitting: Allergy and Immunology

## 2018-01-23 ENCOUNTER — Encounter: Payer: Self-pay | Admitting: Allergy and Immunology

## 2018-01-23 VITALS — BP 138/88 | HR 81 | Temp 98.2°F | Resp 18 | Ht 68.0 in | Wt 254.6 lb

## 2018-01-23 DIAGNOSIS — H1010 Acute atopic conjunctivitis, unspecified eye: Secondary | ICD-10-CM | POA: Insufficient documentation

## 2018-01-23 DIAGNOSIS — H1013 Acute atopic conjunctivitis, bilateral: Secondary | ICD-10-CM

## 2018-01-23 DIAGNOSIS — J3089 Other allergic rhinitis: Secondary | ICD-10-CM

## 2018-01-23 DIAGNOSIS — J329 Chronic sinusitis, unspecified: Secondary | ICD-10-CM | POA: Insufficient documentation

## 2018-01-23 DIAGNOSIS — J321 Chronic frontal sinusitis: Secondary | ICD-10-CM | POA: Diagnosis not present

## 2018-01-23 MED ORDER — PAZEO 0.7 % OP SOLN: 1.0000 [drp] | OPHTHALMIC | 5 refills | Status: DC

## 2018-01-23 MED ORDER — AZELASTINE HCL 0.1 % NA SOLN: 2.0000 | Freq: Two times a day (BID) | NASAL | 5 refills | Status: DC

## 2018-01-23 NOTE — Assessment & Plan Note (Addendum)
   Treatment plan as outlined above for allergic rhinitis.  A prescription has been provided for Pazeo, one drop per eye daily as needed.  A prior authorization will be sent to the [facility] to allow for the use of this medication as he has tried and failed other ophthalmic eyedrops.  I have also recommended eye lubricant drops (i.e., Natural Tears) as needed.

## 2018-01-23 NOTE — Progress Notes (Signed)
New Patient Note  RE: Cameron Daniel MRN: [id number] DOB: 07-27-1946 Date of Office Visit: 01/23/2018  Referring provider: Georgiana Shore., P* Primary care provider: [name], MD  Chief Complaint: Conjunctivitis; Nasal Congestion; and Sinus Problem   History of present illness: Cameron Daniel is a 72 y.o. male seen today in consultation requested by Brett Canales, PA-C. He complains of red, itchy, watery eyes.  In addition, he experiences copious rhinorrhea, nasal congestion, postnasal drainage, and frontal sinus pressure.  These symptoms occur year-round despite compliance with fluticasone nasal spray and fexofenadine, however the symptoms are more frequent and severe when he is outdoors during pollen seasons.  Regarding his ocular symptoms, he states that Pataday is the only eyedrop that provides significant relief, however his insurance does not cover this medication.  He has tried and failed Zaditor eyedrops.  Assessment and plan: Allergic rhinitis  Aeroallergen avoidance measures have been discussed and provided in written form.  Continue fexofenadine 180 mg daily if needed.  Continue fluticasone nasal spray, 2 sprays per nostril daily as needed.  A prescription has been provided for azelastine nasal spray, 1-2 sprays per nostril 2 times daily as needed. Proper nasal spray technique has been discussed and demonstrated.   Nasal saline spray (i.e., Simply Saline) or nasal saline lavage (i.e., NeilMed) is recommended as needed and prior to medicated nasal sprays.  Allergic conjunctivitis  Treatment plan as outlined above for allergic rhinitis.  A prescription has been provided for Pazeo, one drop per eye daily as needed.  A prior authorization will be sent to the [facility] to allow for the use of this medication as he has tried and failed other ophthalmic eyedrops.  I have also recommended eye lubricant drops (i.e., Natural Tears) as  needed.   Meds ordered this encounter  Medications  . PAZEO 0.7 % SOLN    Sig: Apply 1 drop to eye 1 day or 1 dose for 1 dose.    Dispense:  2.5 mL    Refill:  5  . azelastine (ASTELIN) 0.1 % nasal spray    Sig: Place 2 sprays into both nostrils 2 (two) times daily. Use in each nostril as directed    Dispense:  30 mL    Refill:  5    Diagnostics: Epicutaneous testing: Negative despite a positive histamine control. Intradermal testing: Positive to molds and dust mite antigen.    Physical examination: Blood pressure 138/88, pulse 81, temperature 98.2 F (36.8 C), temperature source Oral, resp. rate 18, height 5\' 8"  (1.727 m), weight 254 lb 9.6 oz (115.5 kg), SpO2 96 %.  General: Alert, interactive, in no acute distress. HEENT: Moderate injection of the sclerae and conjunctiva.  TMs pearly gray, turbinates moderately edematous with thick discharge, post-pharynx erythematous. Neck: Supple without lymphadenopathy. Lungs: Clear to auscultation without wheezing, rhonchi or rales. CV: Normal S1, S2 without murmurs. Abdomen: Nondistended, nontender. Skin: Warm and dry, without lesions or rashes. Extremities:  No clubbing, cyanosis or edema. Neuro:   Grossly intact.  Review of systems:  Review of systems negative except as noted in HPI / PMHx or noted below: Review of Systems  Constitutional: Negative.   HENT: Negative.   Eyes: Negative.   Respiratory: Negative.   Cardiovascular: Negative.   Gastrointestinal: Negative.   Genitourinary: Negative.   Musculoskeletal: Negative.   Skin: Negative.   Neurological: Negative.   Endo/Heme/Allergies: Negative.   Psychiatric/Behavioral: Negative.     Past medical history:  Past Medical History:  Diagnosis Date  . Allergic rhinitis, cause unspecified   . Allergy   . Anxiety   . Arthritis   . Cancer ([place])   . Cataract   . Coronary atherosclerosis of unspecified type of vessel, native or graft    1985 , arthroplasty x 1   .  Depression    PTSD   . Diverticulosis of colon (without mention of hemorrhage)   . Esophageal reflux   . Esophageal stricture   . Flushing   . Hemorrhoids   . Irritable bowel syndrome   . Myocardial infarction ([place])   . Nocturia   . Obesity, unspecified   . Other acquired absence of organ   . Other and unspecified hyperlipidemia   . Other malaise and fatigue   . Personal history of colonic polyps   . Psychosexual dysfunction with inhibited sexual excitement   . Type II or unspecified type diabetes mellitus without mention of complication, not stated as uncontrolled   . Unspecified essential hypertension     Past surgical history:  Past Surgical History:  Procedure Laterality Date  . ADENOIDECTOMY    . CARPAL TUNNEL RELEASE     left   . CHOLECYSTECTOMY    . COLON RESECTION     18 inches  . COLON SURGERY  2013  . CORONARY ANGIOPLASTY     1985   . ELBOW BURSA SURGERY    . EYE SURGERY    . POLYPECTOMY    . SHOULDER SURGERY     Rt. Shoulder  . TONSILLECTOMY      Family history: Family History  Problem Relation Age of Onset  . Lung cancer Father   . Allergic rhinitis Father   . Coronary artery disease Mother   . Colon cancer Neg Hx   . Heart attack Neg Hx   . Hypertension Neg Hx   . Stroke Neg Hx   . Esophageal cancer Neg Hx   . Rectal cancer Neg Hx   . Stomach cancer Neg Hx   . Pancreatic cancer Neg Hx     Social history: Social History   Socioeconomic History  . Marital status: Married    Spouse name: Not on file  . Number of children: 1  . Years of education: Not on file  . Highest education level: Not on file  Occupational History  . 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 Financial resource strain: Not on file  . Food insecurity:    Worry: Not on file    Inability: Not on file  . Transportation needs:    Medical: Not on file    Non-medical: Not on file  Tobacco Use  . Smoking status: Former Smoker    Last attempt to quit: 08/30/1983    Years since  quitting: 34.4  . Smokeless tobacco: Former Systems developer  . Tobacco comment: Quit 30 years ago.  Substance and Sexual Activity  . Alcohol use: Yes    Comment: rare  . Drug use: No  . Sexual activity: Yes  Lifestyle  . Physical activity:    Days per week: Not on file    Minutes per session: Not on file  . Stress: Not on file  Relationships  . Social connections:    Talks on phone: Not on file    Gets together: Not on file    Attends religious service: Not on file    Active member of club or organization: Not on file    Attends meetings of clubs or organizations: Not  on file    Relationship status: Not on file  . Intimate partner violence:    Fear of current or ex partner: Not on file    Emotionally abused: Not on file    Physically abused: Not on file    Forced sexual activity: Not on file  Other Topics Concern  . Not on file  Social History Narrative  . Not on file   Environmental History: The patient lives in a house with hardwood floors throughout and central air/heat.  There are no pets in the home.  There is no known mold/water damage in the home.  He is a non-smoker.  Allergies as of 01/23/2018      Reactions   Doxazosin Mesylate    Reaction=unknown   Doxazosin Mesylate Other (See Comments)   Other Other (See Comments)   Flomax [tamsulosin Hcl]    Itching   Jardiance [empagliflozin] Other (See Comments)   Nausea, dizziness - Severe (per pt)   Penicillins Rash, Other (See Comments)   Has patient had a PCN reaction causing immediate rash, facial/tongue/throat swelling, SOB or lightheadedness with hypotension: No Has patient had a PCN reaction causing severe rash involving mucus membranes or skin necrosis: No Has patient had a PCN reaction that required hospitalization No Has patient had a PCN reaction occurring within the last 10 years: No If all of the above answers are "NO", then may proceed with Cephalosporin use.   Sulfonamide Derivatives Rash   Trulicity [dulaglutide]  Nausea And Vomiting   N&V, Dizziness      Medication List        Accurate as of 01/23/18  1:19 PM. Always use your most recent med list.          aspirin 81 MG tablet Take 81 mg by mouth every morning.   azelastine 0.1 % nasal spray Commonly known as:  ASTELIN Place 2 sprays into both nostrils 2 (two) times daily. Use in each nostril as directed   citalopram 10 MG tablet Commonly known as:  CELEXA Take 20 mg by mouth daily.   colestipol 5 g packet Commonly known as:  COLESTID Take 5 g by mouth 2 (two) times daily.   fexofenadine 180 MG tablet Commonly known as:  ALLEGRA Take 180 mg by mouth daily as needed. For allergies   finasteride 5 MG tablet Commonly known as:  PROSCAR Take 1 tablet (5 mg total) by mouth daily.   fluticasone 50 MCG/ACT nasal spray Commonly known as:  FLONASE Place 1 spray into the nose daily.   glipiZIDE 5 MG tablet Commonly known as:  GLUCOTROL Take 1 tablet (5 mg total) by mouth daily before breakfast.   meloxicam 15 MG tablet Commonly known as:  MOBIC Take 15 mg by mouth daily.   MULTI-VITAMINS Tabs Take 1 capsule by mouth daily.   neomycin-polymyxin b-dexamethasone 3.5-10000-0.1 Oint Commonly known as:  MAXITROL APPLY TO BOTH UPPER AND LOWER LID MARGINS OF BOTH EYES AT BEDTIME FOR 10 NIGHTS THEN STOP.   olmesartan 20 MG tablet Commonly known as:  BENICAR Take 1 tablet (20 mg total) by mouth daily.   omega-3 acid ethyl esters 1 g capsule Commonly known as:  LOVAZA Take 1 capsule (1 g total) by mouth daily.   pantoprazole 40 MG tablet Commonly known as:  PROTONIX TAKE 1 TABLET (40 MG TOTAL) BY MOUTH EVERY MORNING.   PAZEO 0.7 % Soln Generic drug:  Olopatadine HCl Apply 1 drop to eye 1 day or 1 dose for 1  dose.   pioglitazone 15 MG tablet Commonly known as:  ACTOS TAKE 1 TABLET BY MOUTH EVERY DAY IN THE MORNING   pregabalin 50 MG capsule Commonly known as:  LYRICA Take 1 capsule (50 mg total) by mouth 3 (three) times  daily.   rosuvastatin 10 MG tablet Commonly known as:  CRESTOR TAKE 1 TABLET (10 MG TOTAL) BY MOUTH DAILY.   testosterone cypionate 200 MG/ML injection Commonly known as:  DEPOTESTOSTERONE CYPIONATE INJECT 1 ML EVERY 14 DAYS   Vitamin D3 1000 units Caps Take 1,000 Units by mouth daily.       Known medication allergies: Allergies  Allergen Reactions  . Doxazosin Mesylate     Reaction=unknown  . Doxazosin Mesylate Other (See Comments)  . Other Other (See Comments)  . Flomax [Tamsulosin Hcl]     Itching   . Jardiance [Empagliflozin] Other (See Comments)    Nausea, dizziness - Severe (per pt)  . Penicillins Rash and Other (See Comments)    Has patient had a PCN reaction causing immediate rash, facial/tongue/throat swelling, SOB or lightheadedness with hypotension: No Has patient had a PCN reaction causing severe rash involving mucus membranes or skin necrosis: No Has patient had a PCN reaction that required hospitalization No Has patient had a PCN reaction occurring within the last 10 years: No If all of the above answers are "NO", then may proceed with Cephalosporin use.   . Sulfonamide Derivatives Rash  . Trulicity [Dulaglutide] Nausea And Vomiting    N&V, Dizziness    I appreciate the opportunity to take part in [place] care. Please do not hesitate to contact me with questions.  Sincerely,   R. Edgar Frisk, MD

## 2018-01-23 NOTE — Patient Instructions (Addendum)
Allergic rhinitis  Aeroallergen avoidance measures have been discussed and provided in written form.  Continue fexofenadine 180 mg daily if needed.  Continue fluticasone nasal spray, 2 sprays per nostril daily as needed.  A prescription has been provided for azelastine nasal spray, 1-2 sprays per nostril 2 times daily as needed. Proper nasal spray technique has been discussed and demonstrated.   Nasal saline spray (i.e., Simply Saline) or nasal saline lavage (i.e., NeilMed) is recommended as needed and prior to medicated nasal sprays.  Allergic conjunctivitis  Treatment plan as outlined above for allergic rhinitis.  A prescription has been provided for Pazeo, one drop per eye daily as needed.  A prior authorization will be sent to the [facility] to allow for the use of this medication as he has tried and failed other ophthalmic eyedrops.  I have also recommended eye lubricant drops (i.e., Natural Tears) as needed.   Return in about 4 months (around 05/26/2018), or if symptoms worsen or fail to improve.  Control of House Dust Mite Allergen  House dust mites play a major role in allergic asthma and rhinitis.  They occur in environments with high humidity wherever human skin, the food for dust mites is found. High levels have been detected in dust obtained from mattresses, pillows, carpets, upholstered furniture, bed covers, clothes and soft toys.  The principal allergen of the house dust mite is found in its feces.  A gram of dust may contain 1,000 mites and 250,000 fecal particles.  Mite antigen is easily measured in the air during house cleaning activities.    1. Encase mattresses, including the box spring, and pillow, in an air tight cover.  Seal the zipper end of the encased mattresses with wide adhesive tape. 2. Wash the bedding in water of 130 degrees Farenheit weekly.  Avoid cotton comforters/quilts and flannel bedding: the most ideal bed covering is the dacron  comforter. 3. Remove all upholstered furniture from the bedroom. 4. Remove carpets, carpet padding, rugs, and non-washable window drapes from the bedroom.  Wash drapes weekly or use plastic window coverings. 5. Remove all non-washable stuffed toys from the bedroom.  Wash stuffed toys weekly. 6. Have the room cleaned frequently with a vacuum cleaner and a damp dust-mop.  The patient should not be in a room which is being cleaned and should wait 1 hour after cleaning before going into the room. 7. Close and seal all heating outlets in the bedroom.  Otherwise, the room will become filled with dust-laden air.  An electric heater can be used to heat the room. 8. Reduce indoor humidity to less than 50%.  Do not use a humidifier.  Control of Mold Allergen  Mold and fungi can grow on a variety of surfaces provided certain temperature and moisture conditions exist.  Outdoor molds grow on plants, decaying vegetation and soil.  The major outdoor mold, Alternaria and Cladosporium, are found in very high numbers during hot and dry conditions.  Generally, a late Summer - Fall peak is seen for common outdoor fungal spores.  Rain will temporarily lower outdoor mold spore count, but counts rise rapidly when the rainy period ends.  The most important indoor molds are Aspergillus and Penicillium.  Dark, humid and poorly ventilated basements are ideal sites for mold growth.  The next most common sites of mold growth are the bathroom and the kitchen.  Outdoor [employer] 1. Use air conditioning and keep windows closed 2. Avoid exposure to decaying vegetation. 3. Avoid leaf raking.  4. Avoid grain handling. 5. Consider wearing a face mask if working in moldy areas.  Indoor Mold Control 1. Maintain humidity below 50%. 2. Clean washable surfaces with 5% bleach solution. 3. Remove sources e.g. Contaminated carpets.

## 2018-01-23 NOTE — Assessment & Plan Note (Signed)
   Aeroallergen avoidance measures have been discussed and provided in written form.  Continue fexofenadine 180 mg daily if needed.  Continue fluticasone nasal spray, 2 sprays per nostril daily as needed.  A prescription has been provided for azelastine nasal spray, 1-2 sprays per nostril 2 times daily as needed. Proper nasal spray technique has been discussed and demonstrated.   Nasal saline spray (i.e., Simply Saline) or nasal saline lavage (i.e., NeilMed) is recommended as needed and prior to medicated nasal sprays.

## 2018-01-26 ENCOUNTER — Ambulatory Visit (INDEPENDENT_AMBULATORY_CARE_PROVIDER_SITE_OTHER): Payer: Non-veteran care

## 2018-01-26 DIAGNOSIS — Z23 Encounter for immunization: Secondary | ICD-10-CM

## 2018-01-26 NOTE — Progress Notes (Signed)
Shingles vaccine was ran through [facility] and patient is due to pay $47. Patient signed print out and paid $47.00.Shingles vaccine given to patient in the left deltoid muscle and was tolerated well. VIS given to patient.

## 2018-02-01 ENCOUNTER — Ambulatory Visit (INDEPENDENT_AMBULATORY_CARE_PROVIDER_SITE_OTHER): Payer: Medicare HMO | Admitting: Orthopaedic Surgery

## 2018-02-01 ENCOUNTER — Other Ambulatory Visit (INDEPENDENT_AMBULATORY_CARE_PROVIDER_SITE_OTHER): Payer: Self-pay | Admitting: Radiology

## 2018-02-01 ENCOUNTER — Encounter (INDEPENDENT_AMBULATORY_CARE_PROVIDER_SITE_OTHER): Payer: Self-pay | Admitting: Orthopaedic Surgery

## 2018-02-01 VITALS — BP 140/83 | HR 75 | Ht 71.0 in | Wt 250.0 lb

## 2018-02-01 DIAGNOSIS — M25561 Pain in right knee: Secondary | ICD-10-CM

## 2018-02-01 DIAGNOSIS — G8929 Other chronic pain: Secondary | ICD-10-CM

## 2018-02-01 NOTE — Progress Notes (Signed)
Office Visit Note   Patient: Cameron Daniel           Date of Birth: 08/30/1945           MRN: [id number] Visit Date: 02/01/2018              Requested by: [name], MD [address] [facility] [place],  [address] PCP: [name], MD   Assessment & Plan: Visit Diagnoses:  1. Chronic pain of right knee     Plan: Recent acute exacerbation of right knee pain.  I am concerned that he may have a tear of the medial or lateral meniscus.  Will order MRI scan Follow-Up Instructions: Return after MRI right knee.   Orders:  No orders of the defined types were placed in this encounter.  No orders of the defined types were placed in this encounter.     Procedures: No procedures performed   Clinical Data: No additional findings.   Subjective: Chief Complaint  Patient presents with  . Right Knee - Pain  . Follow-up    LAST INJECTION 12/05/17 IN R KNEE PAIN GOT UP AND FELT A POP AND HAD MUSCLE PAIN DOWN BACK OF CALF AND BACK OF UPPER THIGH 1 WEEK AGO  Cameron Daniel was seen in October 2000 for evaluation of right knee pain.  X-rays were nondiagnostic at the time.  I performed a cortisone injection I gave him some relief of his pain.  However, he has had several episodes of his knee catching and popping and swelling.  Recently he had some pain getting out of the truck with referred pain both proximally and distally.  At one point he thought he might not be able to fully extend his knee.  Not had any skin rashes or erythema or ecchymosis  HPI  Review of Systems  Constitutional: Positive for fatigue. Negative for fever.  HENT: Negative for ear pain.   Eyes: Negative for pain.  Respiratory: Negative for cough and shortness of breath.   Cardiovascular: Positive for leg swelling.  Gastrointestinal: Negative for constipation and diarrhea.  Genitourinary: Negative for difficulty urinating.  Musculoskeletal: Positive for neck pain. Negative for back pain.  Skin:  Negative for rash.  Allergic/Immunologic: Negative for food allergies.  Neurological: Positive for weakness. Negative for numbness.  Hematological: Bruises/bleeds easily.  Psychiatric/Behavioral: Negative for sleep disturbance.     Objective: Vital Signs: BP 140/83 (BP Location: Left Arm, Patient Position: Sitting, Cuff Size: Normal)   Pulse 75   Ht 5\' 11"  (1.803 m)   Wt 250 lb (113.4 kg)   BMI 34.87 kg/m   Physical Exam  Constitutional: He is oriented to person, place, and time. He appears well-developed and well-nourished.  HENT:  Mouth/Throat: Oropharynx is clear and moist.  Eyes: Pupils are equal, round, and reactive to light. EOM are normal.  Pulmonary/Chest: Effort normal.  Neurological: He is alert and oriented to person, place, and time.  Skin: Skin is warm and dry.  Psychiatric: He has a normal mood and affect. His behavior is normal.    Ortho Exam awake alert and oriented x3.  Comfortable sitting.  Very minimal effusion right knee.  Predominant medial joint pain along the posterior inferior joint.  No popping clicking.  No patella pain.  No lateral joint pain.  Full extension flexion over 105 degrees.  No instability.  No calf pain.  No popliteal mass or fullness.  No swelling distally.  Foot was warm.  Leg raise negative.  No pain with range of motion right hip  Specialty Comments:  No specialty comments available.  Imaging: No results found.   PMFS History: Patient Active Problem List   Diagnosis Date Noted  . Allergic conjunctivitis 01/23/2018  . Chronic sinusitis 01/23/2018  . Gout 04/06/2012  . DIVERTICULITIS-COLON 06/18/2010  . FACIAL FLUSHING 12/11/2007  . NOCTURIA 12/11/2007  . DIABETES MELLITUS, TYPE II 09/07/2007  . OTHER MALAISE AND FATIGUE 09/07/2007  . Allergic rhinitis 08/16/2007  . HYPERLIPIDEMIA 01/24/2007  . OBESITY 01/24/2007  . ERECTILE DYSFUNCTION 01/24/2007  . HYPERTENSION 01/24/2007  . CORONARY ARTERY DISEASE 01/24/2007  . GERD  01/24/2007  . DIVERTICULOSIS, COLON 01/24/2007  . IRRITABLE BOWEL SYNDROME 01/24/2007  . COLONIC POLYPS, HX OF 01/24/2007  . CHOLECYSTECTOMY, HX OF 01/24/2007   Past Medical History:  Diagnosis Date  . Allergic rhinitis, cause unspecified   . Allergy   . Anxiety   . Arthritis   . Cancer ([name])   . Cataract   . Coronary atherosclerosis of unspecified type of vessel, native or graft    1985 , arthroplasty x 1   . Depression    PTSD   . Diverticulosis of colon (without mention of hemorrhage)   . Esophageal reflux   . Esophageal stricture   . Flushing   . Hemorrhoids   . Irritable bowel syndrome   . Myocardial infarction ([place])   . Nocturia   . Obesity, unspecified   . Other acquired absence of organ   . Other and unspecified hyperlipidemia   . Other malaise and fatigue   . Personal history of colonic polyps   . Psychosexual dysfunction with inhibited sexual excitement   . Type II or unspecified type diabetes mellitus without mention of complication, not stated as uncontrolled   . Unspecified essential hypertension     Family History  Problem Relation Age of Onset  . Lung cancer Father   . Allergic rhinitis Father   . Coronary artery disease Mother   . Colon cancer Neg Hx   . Heart attack Neg Hx   . Hypertension Neg Hx   . Stroke Neg Hx   . Esophageal cancer Neg Hx   . Rectal cancer Neg Hx   . Stomach cancer Neg Hx   . Pancreatic cancer Neg Hx     Past Surgical History:  Procedure Laterality Date  . ADENOIDECTOMY    . CARPAL TUNNEL RELEASE     left   . CHOLECYSTECTOMY    . COLON RESECTION     18 inches  . COLON SURGERY  2013  . CORONARY ANGIOPLASTY     1985   . ELBOW BURSA SURGERY    . EYE SURGERY    . POLYPECTOMY    . SHOULDER SURGERY     Rt. Shoulder  . TONSILLECTOMY     Social History   Occupational History  . 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 Smoking status: Former Smoker    Last attempt to quit: 08/30/1983    Years since quitting: 34.4  .  Smokeless tobacco: Former Systems developer  . Tobacco comment: Quit 30 years ago.  Substance and Sexual Activity  . Alcohol use: Yes    Comment: rare  . Drug use: No  . Sexual activity: Yes

## 2018-02-02 ENCOUNTER — Ambulatory Visit (INDEPENDENT_AMBULATORY_CARE_PROVIDER_SITE_OTHER): Payer: Medicare HMO | Admitting: Family Medicine

## 2018-02-02 DIAGNOSIS — E291 Testicular hypofunction: Secondary | ICD-10-CM | POA: Diagnosis not present

## 2018-02-15 ENCOUNTER — Ambulatory Visit
Admission: RE | Admit: 2018-02-15 | Discharge: 2018-02-15 | Disposition: A | Discharge status: Home / Self Care | Payer: Medicare HMO | Source: Ambulatory Visit | Attending: Orthopaedic Surgery | Admitting: Orthopaedic Surgery

## 2018-02-15 DIAGNOSIS — M25561 Pain in right knee: Secondary | ICD-10-CM | POA: Diagnosis not present

## 2018-02-15 DIAGNOSIS — G8929 Other chronic pain: Secondary | ICD-10-CM

## 2018-02-19 ENCOUNTER — Other Ambulatory Visit: Payer: Self-pay | Admitting: Family Medicine

## 2018-02-19 ENCOUNTER — Telehealth: Payer: Self-pay | Admitting: Family Medicine

## 2018-02-19 ENCOUNTER — Telehealth (INDEPENDENT_AMBULATORY_CARE_PROVIDER_SITE_OTHER): Payer: Self-pay | Admitting: Orthopaedic Surgery

## 2018-02-19 MED ORDER — FINASTERIDE 5 MG PO TABS: 5.0000 mg | ORAL_TABLET | Freq: Every day | ORAL | 3 refills | Status: DC

## 2018-02-19 MED ORDER — OLMESARTAN MEDOXOMIL 20 MG PO TABS: 20.0000 mg | ORAL_TABLET | Freq: Every day | ORAL | 3 refills | Status: DC

## 2018-02-19 NOTE — Telephone Encounter (Signed)
Medication called/sent to requested pharmacy  

## 2018-02-19 NOTE — Telephone Encounter (Signed)
Patient called stating his knee is still locking up on him.  Patient had MRI on 02/15/18 and is scheduled for a MRI follow-up appointment with Dr. Durward Fortes on 03/05/18.  Patient is aware that Dr. Durward Fortes is out of the office until 02/26/18, but is still requesting a return call at [phone].

## 2018-02-19 NOTE — Telephone Encounter (Signed)
SPOKE WITH PT. PT STATES HE WILL SEE DR 03/05/18

## 2018-02-19 NOTE — Telephone Encounter (Signed)
Can he just get the Benicar from a different pharmacy? If change medication he is going to need to come in for follow-up

## 2018-02-19 NOTE — Telephone Encounter (Signed)
Spoke w pt wife and notified her to have him call the office back bc we were returning his phone call

## 2018-02-19 NOTE — Telephone Encounter (Signed)
Pt needs refill on olmesartan and finasteride to [facility] [address].

## 2018-02-19 NOTE — Telephone Encounter (Signed)
Pharmacy sent message that Benicar 20mg  is on back order and would like to switch for Micardis 40mg  - OK to switch?

## 2018-02-20 NOTE — Telephone Encounter (Signed)
Called and spoke to pt and he would like to stay on Benicar and if he needs to go to another pharm he will. He is going to try and get it through the [place].

## 2018-02-21 DIAGNOSIS — E1140 Type 2 diabetes mellitus with diabetic neuropathy, unspecified: Secondary | ICD-10-CM | POA: Diagnosis not present

## 2018-02-23 DIAGNOSIS — E119 Type 2 diabetes mellitus without complications: Secondary | ICD-10-CM | POA: Diagnosis not present

## 2018-02-27 ENCOUNTER — Encounter (INDEPENDENT_AMBULATORY_CARE_PROVIDER_SITE_OTHER): Payer: Self-pay | Admitting: Orthopaedic Surgery

## 2018-02-27 ENCOUNTER — Ambulatory Visit (INDEPENDENT_AMBULATORY_CARE_PROVIDER_SITE_OTHER): Payer: Medicare HMO | Admitting: Orthopaedic Surgery

## 2018-02-27 VITALS — BP 144/83 | HR 80 | Ht 71.0 in | Wt 250.0 lb

## 2018-02-27 DIAGNOSIS — G8929 Other chronic pain: Secondary | ICD-10-CM

## 2018-02-27 DIAGNOSIS — M25561 Pain in right knee: Secondary | ICD-10-CM | POA: Diagnosis not present

## 2018-02-27 NOTE — Progress Notes (Signed)
Office Visit Note   Patient: Cameron Daniel           Date of Birth: 09-25-1945           MRN: [id number] Visit Date: 02/27/2018              Requested by: [name], MD [address] [facility] [place], [place] [address] PCP: [name], MD   Assessment & Plan: Visit Diagnoses:  1. 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  There is some degeneration of the ACL probably based on his age.  Long discussion with Mr. Mrs. Heng regarding the diagnosis and review of the MRI scan.  We have talked about knee replacement and Visco supplementation.  He like to proceed with the Visco supplementation.  We will check with the insurance company.  I will also apply a spider brace  Follow-Up Instructions: Return will precert visco.   Orders:  No orders of the defined types were placed in this encounter.  No orders of the defined types were placed in this encounter.     Procedures: No procedures performed   Clinical Data: No additional findings.   Subjective: Chief Complaint  Patient presents with  . Follow-up    MRI REVIEW  No change in symptoms of the right knee pain.  Occasionally he will have some swelling.  Some pain referred to the posterior aspect of his knee.  No sensation of his knee giving way or evidence of instability  HPI  Review of Systems  Constitutional: Positive for fatigue. Negative for fever.  HENT: Negative for ear pain.   Eyes: Negative for pain.  Respiratory: Negative for cough and shortness of breath.   Cardiovascular: Positive for leg swelling.  Gastrointestinal: Negative for constipation and diarrhea.  Genitourinary: Negative for difficulty urinating.  Musculoskeletal: Positive for back pain and neck pain.  Skin: Negative for rash.  Allergic/Immunologic: Negative for food allergies.  Neurological: Positive for weakness and numbness.    Hematological: Bruises/bleeds easily.  Psychiatric/Behavioral: Negative for sleep disturbance.     Objective: Vital Signs: BP (!) 144/83 (BP Location: Left Arm, Patient Position: Sitting, Cuff Size: Normal)   Pulse 80   Ht 5\' 11"  (1.803 m)   Wt 250 lb (113.4 kg)   BMI 34.87 kg/m   Physical Exam  Constitutional: He is oriented to person, place, and time. He appears well-developed and well-nourished.  HENT:  Mouth/Throat: Oropharynx is clear and moist.  Eyes: Pupils are equal, round, and reactive to light. EOM are normal.  Pulmonary/Chest: Effort normal.  Neurological: He is alert and oriented to person, place, and time.  Skin: Skin is warm and dry.  Psychiatric: He has a normal mood and affect. His behavior is normal.    Ortho Exam awake alert and oriented x3.  Comfortable sitting.  Did not have a limp with ambulation.  Minimal effusion right knee.  Appears to have very minimal increased varus.  Predominantly medial joint pain but does have some patellar crepitation.  No pain laterally.  No instability.  No popliteal pain or mass.  No calf pain.  No distal edema.  Skin intact. Specialty Comments:  No specialty comments available.  Imaging: No results found.   PMFS History: Patient Active Problem List   Diagnosis Date Noted  . Allergic conjunctivitis 01/23/2018  . Chronic sinusitis 01/23/2018  . Gout 04/06/2012  . DIVERTICULITIS-COLON 06/18/2010  .  FACIAL FLUSHING 12/11/2007  . NOCTURIA 12/11/2007  . DIABETES MELLITUS, TYPE II 09/07/2007  . OTHER MALAISE AND FATIGUE 09/07/2007  . Allergic rhinitis 08/16/2007  . HYPERLIPIDEMIA 01/24/2007  . OBESITY 01/24/2007  . ERECTILE DYSFUNCTION 01/24/2007  . HYPERTENSION 01/24/2007  . CORONARY ARTERY DISEASE 01/24/2007  . GERD 01/24/2007  . DIVERTICULOSIS, COLON 01/24/2007  . IRRITABLE BOWEL SYNDROME 01/24/2007  . COLONIC POLYPS, HX OF 01/24/2007  . CHOLECYSTECTOMY, HX OF 01/24/2007   Past Medical History:  Diagnosis Date   . Allergic rhinitis, cause unspecified   . Allergy   . Anxiety   . Arthritis   . Cancer ([place])   . Cataract   . Coronary atherosclerosis of unspecified type of vessel, native or graft    1985 , arthroplasty x 1   . Depression    PTSD   . Diverticulosis of colon (without mention of hemorrhage)   . Esophageal reflux   . Esophageal stricture   . Flushing   . Hemorrhoids   . Irritable bowel syndrome   . Myocardial infarction ([place])   . Nocturia   . Obesity, unspecified   . Other acquired absence of organ   . Other and unspecified hyperlipidemia   . Other malaise and fatigue   . Personal history of colonic polyps   . Psychosexual dysfunction with inhibited sexual excitement   . Type II or unspecified type diabetes mellitus without mention of complication, not stated as uncontrolled   . Unspecified essential hypertension     Family History  Problem Relation Age of Onset  . Lung cancer Father   . Allergic rhinitis Father   . Coronary artery disease Mother   . Colon cancer Neg Hx   . Heart attack Neg Hx   . Hypertension Neg Hx   . Stroke Neg Hx   . Esophageal cancer Neg Hx   . Rectal cancer Neg Hx   . Stomach cancer Neg Hx   . Pancreatic cancer Neg Hx     Past Surgical History:  Procedure Laterality Date  . ADENOIDECTOMY    . CARPAL TUNNEL RELEASE     left   . CHOLECYSTECTOMY    . COLON RESECTION     18 inches  . COLON SURGERY  2013  . CORONARY ANGIOPLASTY     1985   . ELBOW BURSA SURGERY    . EYE SURGERY    . POLYPECTOMY    . SHOULDER SURGERY     Rt. Shoulder  . TONSILLECTOMY     Social History   Occupational History  . 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 Smoking status: Former Smoker    Last attempt to quit: 08/30/1983    Years since quitting: 34.5  . Smokeless tobacco: Former Systems developer  . Tobacco comment: Quit 30 years ago.  Substance and Sexual Activity  . Alcohol use: Yes    Comment: rare  . Drug use: No  . Sexual activity: Yes

## 2018-02-28 ENCOUNTER — Telehealth (INDEPENDENT_AMBULATORY_CARE_PROVIDER_SITE_OTHER): Payer: Self-pay

## 2018-02-28 ENCOUNTER — Ambulatory Visit (INDEPENDENT_AMBULATORY_CARE_PROVIDER_SITE_OTHER): Payer: Medicare HMO | Admitting: Family Medicine

## 2018-02-28 DIAGNOSIS — E291 Testicular hypofunction: Secondary | ICD-10-CM | POA: Diagnosis not present

## 2018-02-28 NOTE — Telephone Encounter (Signed)
Submitted application online for Monovisc, right knee. 

## 2018-03-02 ENCOUNTER — Telehealth (INDEPENDENT_AMBULATORY_CARE_PROVIDER_SITE_OTHER): Payer: Self-pay | Admitting: Orthopaedic Surgery

## 2018-03-02 NOTE — Telephone Encounter (Signed)
Patient request a call back on mobile or at home regarding his knee. Patient wants to discuss knee brace that he received.

## 2018-03-02 NOTE — Telephone Encounter (Signed)
Spoke w/pt. °

## 2018-03-05 ENCOUNTER — Telehealth: Payer: Self-pay | Admitting: Family Medicine

## 2018-03-05 ENCOUNTER — Ambulatory Visit (INDEPENDENT_AMBULATORY_CARE_PROVIDER_SITE_OTHER): Payer: Non-veteran care | Admitting: Orthopaedic Surgery

## 2018-03-05 MED ORDER — METRONIDAZOLE 500 MG PO TABS: 500.0000 mg | ORAL_TABLET | Freq: Two times a day (BID) | ORAL | 0 refills | Status: DC

## 2018-03-05 MED ORDER — CIPROFLOXACIN HCL 500 MG PO TABS: 500.0000 mg | ORAL_TABLET | Freq: Two times a day (BID) | ORAL | 0 refills | Status: DC

## 2018-03-05 NOTE — Telephone Encounter (Signed)
Pt called and states that he is having severe gas, explosive diarrhea and abd pain and would like to know if you would send him in some Flaygl?

## 2018-03-05 NOTE — Telephone Encounter (Signed)
Had diverticulitis in the past.  Can use flagyl 500 bid and cipro 500 bid for 10 days

## 2018-03-05 NOTE — Telephone Encounter (Signed)
Medication called/sent to requested pharmacy and pt aware 

## 2018-03-08 ENCOUNTER — Ambulatory Visit (INDEPENDENT_AMBULATORY_CARE_PROVIDER_SITE_OTHER): Payer: Medicare HMO | Admitting: Family Medicine

## 2018-03-08 ENCOUNTER — Encounter: Payer: Self-pay | Admitting: Family Medicine

## 2018-03-08 ENCOUNTER — Telehealth (INDEPENDENT_AMBULATORY_CARE_PROVIDER_SITE_OTHER): Payer: Self-pay

## 2018-03-08 VITALS — BP 130/78 | HR 78 | Temp 98.2°F | Resp 18 | Ht 71.0 in | Wt 257.0 lb

## 2018-03-08 DIAGNOSIS — K5792 Diverticulitis of intestine, part unspecified, without perforation or abscess without bleeding: Secondary | ICD-10-CM | POA: Diagnosis not present

## 2018-03-08 DIAGNOSIS — M545 Low back pain, unspecified: Secondary | ICD-10-CM

## 2018-03-08 LAB — URINALYSIS, ROUTINE W REFLEX MICROSCOPIC
BILIRUBIN URINE: NEGATIVE
Hgb urine dipstick: NEGATIVE
KETONES UR: NEGATIVE
Leukocytes, UA: NEGATIVE
Nitrite: NEGATIVE
Protein, ur: NEGATIVE
SPECIFIC GRAVITY, URINE: 1.015 (ref 1.001–1.03)
pH: 7 (ref 5.0–8.0)

## 2018-03-08 MED ORDER — CITALOPRAM HYDROBROMIDE 10 MG PO TABS: 20.0000 mg | ORAL_TABLET | Freq: Every day | ORAL | 3 refills | Status: DC

## 2018-03-08 NOTE — Telephone Encounter (Signed)
PA initiated with [employer].  PA form being faxed to our office today, 03/08/2018.

## 2018-03-08 NOTE — Progress Notes (Signed)
Subjective:    Patient ID: Cameron Daniel, male    DOB: Apr 30, 1946, 72 y.o.   MRN: [id number]  HPI Patient presents with left-sided testicular pain. His left epididymis is exquisitely tender to palpation. The pain radiates up the inguinal canal. He denies any dysuria or hematuria or fevers or chills.  He also has left flank pain radiating into his left testicle raising the concern about a possible kidney stone however urinalysis is completely normal today.  The symptoms started 3 days ago.  3 days ago he was also started empirically on Cipro and Flagyl for possible colitis.  He has a history of diverticulitis with perforation requiring partial resection of the colon.  He states that 10 days ago he developed left lower quadrant abdominal pain that was severe with diarrhea and gas and bloating.  He states that since he started the Cipro and the Flagyl this left lower quadrant abdominal pain has improved dramatically but that he has noticed pain in his left flank radiating into his left testicle. Past Medical History:  Diagnosis Date  . Allergic rhinitis, cause unspecified   . Allergy   . Anxiety   . Arthritis   . Cancer ([place])   . Cataract   . Coronary atherosclerosis of unspecified type of vessel, native or graft    1985 , arthroplasty x 1   . Depression    PTSD   . Diverticulosis of colon (without mention of hemorrhage)   . Esophageal reflux   . Esophageal stricture   . Flushing   . Hemorrhoids   . Irritable bowel syndrome   . Myocardial infarction ([place])   . Nocturia   . Obesity, unspecified   . Other acquired absence of organ   . Other and unspecified hyperlipidemia   . Other malaise and fatigue   . Personal history of colonic polyps   . Psychosexual dysfunction with inhibited sexual excitement   . Type II or unspecified type diabetes mellitus without mention of complication, not stated as uncontrolled   . Unspecified essential hypertension    Past Surgical History:    Procedure Laterality Date  . ADENOIDECTOMY    . CARPAL TUNNEL RELEASE     left   . CHOLECYSTECTOMY    . COLON RESECTION     18 inches  . COLON SURGERY  2013  . CORONARY ANGIOPLASTY     1985   . ELBOW BURSA SURGERY    . EYE SURGERY    . POLYPECTOMY    . SHOULDER SURGERY     Rt. Shoulder  . TONSILLECTOMY     Current Outpatient Medications on File Prior to Visit  Medication Sig Dispense Refill  . aspirin 81 MG tablet Take 81 mg by mouth every morning.     Marland Kitchen azelastine (ASTELIN) 0.1 % nasal spray Place 2 sprays into both nostrils 2 (two) times daily. Use in each nostril as directed 30 mL 5  . Cholecalciferol (VITAMIN D3) 1000 units CAPS Take 1,000 Units by mouth daily.    . ciprofloxacin (CIPRO) 500 MG tablet Take 1 tablet (500 mg total) by mouth 2 (two) times daily. 20 tablet 0  . colestipol (COLESTID) 5 g packet Take 5 g by mouth 2 (two) times daily.    . fexofenadine (ALLEGRA) 180 MG tablet Take 180 mg by mouth daily as needed. For allergies    . finasteride (PROSCAR) 5 MG tablet Take 1 tablet (5 mg total) by mouth daily. 90 tablet 3  . glipiZIDE (GLUCOTROL)  5 MG tablet Take 1 tablet (5 mg total) by mouth daily before breakfast. 90 tablet 1  . meloxicam (MOBIC) 15 MG tablet Take 15 mg by mouth daily.    . metroNIDAZOLE (FLAGYL) 500 MG tablet Take 1 tablet (500 mg total) by mouth 2 (two) times daily. 20 tablet 0  . Multiple Vitamin (MULTI-VITAMINS) TABS Take 1 capsule by mouth daily.    Marland Kitchen olmesartan (BENICAR) 20 MG tablet Take 1 tablet (20 mg total) by mouth daily. 90 tablet 3  . omega-3 acid ethyl esters (LOVAZA) 1 g capsule Take 1 capsule (1 g total) by mouth daily. 90 capsule 2  . pantoprazole (PROTONIX) 40 MG tablet TAKE 1 TABLET (40 MG TOTAL) BY MOUTH EVERY MORNING. 90 tablet 3  . pioglitazone (ACTOS) 15 MG tablet TAKE 1 TABLET BY MOUTH EVERY DAY IN THE MORNING 90 tablet 2  . rosuvastatin (CRESTOR) 10 MG tablet TAKE 1 TABLET (10 MG TOTAL) BY MOUTH DAILY. 90 tablet 3  .  testosterone cypionate (DEPOTESTOSTERONE CYPIONATE) 200 MG/ML injection INJECT 1 ML EVERY 14 DAYS  1   Current Facility-Administered Medications on File Prior to Visit  Medication Dose Route Frequency Provider Last Rate Last Dose  . 0.9 %  sodium chloride infusion  500 mL Intravenous Continuous Irene Shipper, MD      . testosterone cypionate (DEPOTESTOSTERONE CYPIONATE) injection 200 mg  200 mg Intramuscular Q14 Days [name], MD   200 mg at 02/28/18 0350   Allergies  Allergen Reactions  . Doxazosin Mesylate     Reaction=unknown  . Doxazosin Mesylate Other (See Comments)  . Other Other (See Comments)  . Flomax [Tamsulosin Hcl]     Itching   . Jardiance [Empagliflozin] Other (See Comments)    Nausea, dizziness - Severe (per pt)  . Penicillins Rash and Other (See Comments)    Has patient had a PCN reaction causing immediate rash, facial/tongue/throat swelling, SOB or lightheadedness with hypotension: No Has patient had a PCN reaction causing severe rash involving mucus membranes or skin necrosis: No Has patient had a PCN reaction that required hospitalization No Has patient had a PCN reaction occurring within the last 10 years: No If all of the above answers are "NO", then may proceed with Cephalosporin use.   . Sulfonamide Derivatives Rash  . Trulicity [Dulaglutide] Nausea And Vomiting    N&V, Dizziness   Social History   Socioeconomic History  . Marital status: Married    Spouse name: Not on file  . Number of children: 1  . Years of education: Not on file  . Highest education level: Not on file  Occupational History  . 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 Financial resource strain: Not on file  . Food insecurity:    Worry: Not on file    Inability: Not on file  . Transportation needs:    Medical: Not on file    Non-medical: Not on file  Tobacco Use  . Smoking status: Former Smoker    Last attempt to quit: 08/30/1983    Years since quitting: 34.5  . Smokeless  tobacco: Former Systems developer  . Tobacco comment: Quit 30 years ago.  Substance and Sexual Activity  . Alcohol use: Yes    Comment: rare  . Drug use: No  . Sexual activity: Yes  Lifestyle  . Physical activity:    Days per week: Not on file    Minutes per session: Not on file  . Stress: Not on file  Relationships  .  Social connections:    Talks on phone: Not on file    Gets together: Not on file    Attends religious service: Not on file    Active member of club or organization: Not on file    Attends meetings of clubs or organizations: Not on file    Relationship status: Not on file  . Intimate partner violence:    Fear of current or ex partner: Not on file    Emotionally abused: Not on file    Physically abused: Not on file    Forced sexual activity: Not on file  Other Topics Concern  . Not on file  Social History Narrative  . Not on file      Review of Systems  All other systems reviewed and are negative.      Objective:   Physical Exam  Cardiovascular: Normal rate, regular rhythm and normal heart sounds.  Pulmonary/Chest: Effort normal and breath sounds normal.  Abdominal: Soft. Bowel sounds are normal. He exhibits no distension and no mass. There is no tenderness. There is no rebound and no guarding. Hernia confirmed negative in the right inguinal area and confirmed negative in the left inguinal area.  Genitourinary: Penis normal. Right testis shows no mass and no tenderness. Left testis shows tenderness.  Lymphadenopathy:       Right: No inguinal adenopathy present.       Left: No inguinal adenopathy present.  Vitals reviewed.         Assessment & Plan:  Left-sided low back pain without sciatica, unspecified chronicity - Plan: Urinalysis, Routine w reflex microscopic  Diverticulitis  I believe the patient had diverticulitis over the last 10 days that is slowly improving on the combination of Cipro and Flagyl.  Complete 10 days of Cipro 500 mg p.o. twice daily and  Flagyl 500 mg p.o. twice daily.  Proceed with a CT scan of the abdomen and pelvis if symptoms are worsening.  I believe that the pain radiating into his left testicle either represents a small kidney stone that is attempting to pass versus epididymitis.  We will continue Cipro which should allow coverage for epididymitis and Flagyl and encourage the patient to drink plenty of fluids.  He is unable to tolerate Flomax due to orthostatic hypotension.  Therefore we will treat the patient symptomatically by having him push fluids and take Cipro and monitoring him over the next week.  If the pain persist, I will proceed with a CT scan with a renal stone protocol to evaluate for nephrolithiasis.

## 2018-03-15 ENCOUNTER — Telehealth (INDEPENDENT_AMBULATORY_CARE_PROVIDER_SITE_OTHER): Payer: Self-pay | Admitting: Orthopaedic Surgery

## 2018-03-15 NOTE — Telephone Encounter (Signed)
Patient called checking the status of his knee brace.  Patient is in the area for the next 30 minutes and can stop by the clinic if you received it.

## 2018-03-16 ENCOUNTER — Telehealth (INDEPENDENT_AMBULATORY_CARE_PROVIDER_SITE_OTHER): Payer: Self-pay

## 2018-03-16 ENCOUNTER — Telehealth (INDEPENDENT_AMBULATORY_CARE_PROVIDER_SITE_OTHER): Payer: Self-pay | Admitting: Radiology

## 2018-03-16 ENCOUNTER — Telehealth (INDEPENDENT_AMBULATORY_CARE_PROVIDER_SITE_OTHER): Payer: Self-pay | Admitting: Orthopaedic Surgery

## 2018-03-16 NOTE — Telephone Encounter (Signed)
Called and left VM advising patient that authorization is needed for gel injection.  PA is currently Pending.

## 2018-03-16 NOTE — Telephone Encounter (Signed)
Patient request a call to get a status update on Gel injection Auth. Please call to advise.

## 2018-03-16 NOTE — Telephone Encounter (Signed)
Left message that his insurance would not cover another knee brace and he could order another one online for a cheaper price.

## 2018-03-16 NOTE — Telephone Encounter (Signed)
Called and left VM for patient concerning gel injection

## 2018-03-16 NOTE — Telephone Encounter (Signed)
Left message that he will have to pick up the brace he dropped off and that we are unable to return knee brace. We cant order one for opposite side because there is no documentation on the other knee and insurance will not pay for another one. I also notified him that he could get a donjoy reaction knee brace in size xl online for about $90 so he dosent get billed $295 for another one from our office.

## 2018-03-22 ENCOUNTER — Other Ambulatory Visit: Payer: Self-pay

## 2018-03-22 ENCOUNTER — Ambulatory Visit (INDEPENDENT_AMBULATORY_CARE_PROVIDER_SITE_OTHER): Payer: Medicare HMO | Admitting: *Deleted

## 2018-03-22 DIAGNOSIS — E291 Testicular hypofunction: Secondary | ICD-10-CM

## 2018-03-22 NOTE — Progress Notes (Signed)
Patient seen in office for testosterone injection.   Tolerated IM administration well in R glut.

## 2018-03-26 NOTE — Telephone Encounter (Signed)
Patient calling to get an update on Gel injections. Per patient, please leave a message if he does not answer.

## 2018-03-27 ENCOUNTER — Telehealth (INDEPENDENT_AMBULATORY_CARE_PROVIDER_SITE_OTHER): Payer: Self-pay

## 2018-03-27 ENCOUNTER — Telehealth (INDEPENDENT_AMBULATORY_CARE_PROVIDER_SITE_OTHER): Payer: Self-pay | Admitting: Orthopaedic Surgery

## 2018-03-27 NOTE — Telephone Encounter (Signed)
See updated message under telephone call date 03/16/2018.

## 2018-03-27 NOTE — Telephone Encounter (Signed)
PA required for Monovisc, right knee. ?Faxed completed PA form to [employer] at [phone]. ?

## 2018-03-27 NOTE — Telephone Encounter (Signed)
Talked with patient and advised him that I spoke with [facility] and they did receive his authorization form and authorization is currently pending.  Will contact patient once a decision has been made from [place]. Patient understands.

## 2018-03-27 NOTE — Telephone Encounter (Signed)
Patient spoke with [facility] about Gel injections. They do not have a record of request for approval for injections. Please call [employer] at [phone] for prior authorization. Patient request this to be done ASAP.

## 2018-03-28 ENCOUNTER — Telehealth (INDEPENDENT_AMBULATORY_CARE_PROVIDER_SITE_OTHER): Payer: Self-pay

## 2018-03-28 NOTE — Telephone Encounter (Signed)
Patient is approved for Monovisc Injection, right knee. [place] Patient will be responsible for 20% OOP. Co-pay $25.00    PA Approval # [id number] Valid 03/27/2018- 06/27/2018  Can you please schedule patient appointment with Dr. Durward Fortes?  Thank You.

## 2018-04-04 ENCOUNTER — Encounter (INDEPENDENT_AMBULATORY_CARE_PROVIDER_SITE_OTHER): Payer: Self-pay | Admitting: Orthopedic Surgery

## 2018-04-04 ENCOUNTER — Ambulatory Visit (INDEPENDENT_AMBULATORY_CARE_PROVIDER_SITE_OTHER): Payer: Medicare HMO | Admitting: Orthopedic Surgery

## 2018-04-04 VITALS — BP 166/88 | HR 85 | Resp 16 | Ht 71.0 in | Wt 250.0 lb

## 2018-04-04 DIAGNOSIS — M1711 Unilateral primary osteoarthritis, right knee: Secondary | ICD-10-CM | POA: Diagnosis not present

## 2018-04-04 MED ORDER — HYALURONAN 88 MG/4ML IX SOSY
88.0000 mg | PREFILLED_SYRINGE | INTRA_ARTICULAR | Status: AC | PRN
  Administered 2018-04-04: 88 mg via INTRA_ARTICULAR

## 2018-04-04 MED ORDER — LIDOCAINE HCL 1 % IJ SOLN
2.0000 mL | INTRAMUSCULAR | Status: AC | PRN
  Administered 2018-04-04: 2 mL

## 2018-04-04 NOTE — Progress Notes (Signed)
Office Visit Note   Patient: Cameron Daniel           Date of Birth: 11/25/1945           MRN: [id number] Visit Date: 04/04/2018              Requested by: [name], MD [address] [facility] [address] [place], [place] [address] PCP: [name], MD   Assessment & Plan: Visit Diagnoses:  1. Unilateral primary osteoarthritis, right knee     Plan:  #1: Monovisc was injected without difficulty.  Tolerated the procedure well.  Follow back up as needed  Follow-Up Instructions: No follow-ups on file.   Orders:  Orders Placed This Encounter  Procedures  . Large Joint Inj   No orders of the defined types were placed in this encounter.     Procedures: Large Joint Inj: R knee on 04/04/2018 10:38 AM Indications: pain and joint swelling Details: 25 G 1.5 in needle, anteromedial approach  Arthrogram: No  Medications: 88 mg Hyaluronan 88 MG/4ML; 2 mL lidocaine 1 % Procedure, treatment alternatives, risks and benefits explained, specific risks discussed. Consent was given by the patient. Immediately prior to procedure a time out was called to verify the correct patient, procedure, equipment, support staff and site/side marked as required. Patient was prepped and draped in the usual sterile fashion.       Clinical Data: No additional findings.   Subjective: Chief Complaint  Patient presents with  . Right Knee - Pain  . Knee Pain    Right knee pain, monovisc inj    HPI  Cameron Daniel is a very pleasant 72 year old white male seen today for evaluation of his right knee and Visco supplementation.  He has had problems with his right knee which have been worsening.  An MRI scan was ordered previously revealing tricompartment degenerative arthrosis without meniscal tears.  There was some degeneration of the ACL based on age.  Discussion of conservative versus operative treatment was noted.  He would like to stay conservative and would like to consider Visco supplementation.  Seen  today for his first injection.   Review of Systems  Constitutional: Positive for fatigue. Negative for fever.  HENT: Negative for ear pain.   Eyes: Negative for pain.  Respiratory: Negative for cough and shortness of breath.   Cardiovascular: Positive for leg swelling.  Gastrointestinal: Negative for constipation and diarrhea.  Genitourinary: Negative for difficulty urinating.  Musculoskeletal: Positive for back pain and neck pain.  Skin: Negative for rash.  Allergic/Immunologic: Negative for food allergies.  Neurological: Positive for weakness and numbness.  Hematological: Bruises/bleeds easily.  Psychiatric/Behavioral: Negative for sleep disturbance.     Objective: Vital Signs: BP (!) 166/88 (BP Location: Left Arm, Patient Position: Sitting, Cuff Size: Normal)   Pulse 85   Resp 16   Ht 5\' 11"  (1.803 m)   Wt 250 lb (113.4 kg)   BMI 34.87 kg/m   Physical Exam  Constitutional: He is oriented to person, place, and time. He appears well-developed and well-nourished.  HENT:  Mouth/Throat: Oropharynx is clear and moist.  Eyes: Pupils are equal, round, and reactive to light. EOM are normal.  Pulmonary/Chest: Effort normal.  Neurological: He is alert and oriented to person, place, and time.  Skin: Skin is warm and dry.  Psychiatric: He has a normal mood and affect. His behavior is normal.    Ortho Exam  Exam today reveals a minimal effusion.  He has pain along the medial  joint line.  He does have also some patellofemoral crepitance.  I do not detect much in way of instability.  No calf pain.  Neurovascular intact.  Skin is intact without ecchymosis.  Specialty Comments:  No specialty comments available.  Imaging: No results found.   PMFS History: Current Outpatient Medications  Medication Sig Dispense Refill  . aspirin 81 MG tablet Take 81 mg by mouth every morning.     Marland Kitchen azelastine (ASTELIN) 0.1 % nasal spray Place 2 sprays into both nostrils 2 (two) times daily. Use  in each nostril as directed 30 mL 5  . Cholecalciferol (VITAMIN D3) 1000 units CAPS Take 1,000 Units by mouth daily.    . ciprofloxacin (CIPRO) 500 MG tablet Take 1 tablet (500 mg total) by mouth 2 (two) times daily. 20 tablet 0  . citalopram (CELEXA) 10 MG tablet Take 2 tablets (20 mg total) by mouth daily. 180 tablet 3  . colestipol (COLESTID) 5 g packet Take 5 g by mouth 2 (two) times daily.    . fexofenadine (ALLEGRA) 180 MG tablet Take 180 mg by mouth daily as needed. For allergies    . finasteride (PROSCAR) 5 MG tablet Take 1 tablet (5 mg total) by mouth daily. 90 tablet 3  . glipiZIDE (GLUCOTROL) 5 MG tablet Take 1 tablet (5 mg total) by mouth daily before breakfast. 90 tablet 1  . meloxicam (MOBIC) 15 MG tablet Take 15 mg by mouth daily.    . metroNIDAZOLE (FLAGYL) 500 MG tablet Take 1 tablet (500 mg total) by mouth 2 (two) times daily. 20 tablet 0  . Multiple Vitamin (MULTI-VITAMINS) TABS Take 1 capsule by mouth daily.    Marland Kitchen olmesartan (BENICAR) 20 MG tablet Take 1 tablet (20 mg total) by mouth daily. 90 tablet 3  . omega-3 acid ethyl esters (LOVAZA) 1 g capsule Take 1 capsule (1 g total) by mouth daily. 90 capsule 2  . pantoprazole (PROTONIX) 40 MG tablet TAKE 1 TABLET (40 MG TOTAL) BY MOUTH EVERY MORNING. 90 tablet 3  . pioglitazone (ACTOS) 15 MG tablet TAKE 1 TABLET BY MOUTH EVERY DAY IN THE MORNING 90 tablet 2  . rosuvastatin (CRESTOR) 10 MG tablet TAKE 1 TABLET (10 MG TOTAL) BY MOUTH DAILY. 90 tablet 3  . testosterone cypionate (DEPOTESTOSTERONE CYPIONATE) 200 MG/ML injection INJECT 1 ML EVERY 14 DAYS  1   Current Facility-Administered Medications  Medication Dose Route Frequency Provider Last Rate Last Dose  . 0.9 %  sodium chloride infusion  500 mL Intravenous Continuous Irene Shipper, MD      . testosterone cypionate (DEPOTESTOSTERONE CYPIONATE) injection 200 mg  200 mg Intramuscular Q14 Days [name], MD   200 mg at 03/22/18 1611    Patient Active Problem List    Diagnosis Date Noted  . Allergic conjunctivitis 01/23/2018  . Chronic sinusitis 01/23/2018  . Gout 04/06/2012  . DIVERTICULITIS-COLON 06/18/2010  . FACIAL FLUSHING 12/11/2007  . NOCTURIA 12/11/2007  . DIABETES MELLITUS, TYPE II 09/07/2007  . OTHER MALAISE AND FATIGUE 09/07/2007  . Allergic rhinitis 08/16/2007  . HYPERLIPIDEMIA 01/24/2007  . OBESITY 01/24/2007  . ERECTILE DYSFUNCTION 01/24/2007  . HYPERTENSION 01/24/2007  . CORONARY ARTERY DISEASE 01/24/2007  . GERD 01/24/2007  . DIVERTICULOSIS, COLON 01/24/2007  . IRRITABLE BOWEL SYNDROME 01/24/2007  . COLONIC POLYPS, HX OF 01/24/2007  . CHOLECYSTECTOMY, HX OF 01/24/2007   Past Medical History:  Diagnosis Date  . Allergic rhinitis, cause unspecified   . Allergy   . Anxiety   .  Arthritis   . Cancer ([place])   . Cataract   . Coronary atherosclerosis of unspecified type of vessel, native or graft    1985 , arthroplasty x 1   . Depression    PTSD   . Diverticulosis of colon (without mention of hemorrhage)   . Esophageal reflux   . Esophageal stricture   . Flushing   . Hemorrhoids   . Irritable bowel syndrome   . Myocardial infarction ([place])   . Nocturia   . Obesity, unspecified   . Other acquired absence of organ   . Other and unspecified hyperlipidemia   . Other malaise and fatigue   . Personal history of colonic polyps   . Psychosexual dysfunction with inhibited sexual excitement   . Type II or unspecified type diabetes mellitus without mention of complication, not stated as uncontrolled   . Unspecified essential hypertension     Family History  Problem Relation Age of Onset  . Lung cancer Father   . Allergic rhinitis Father   . Coronary artery disease Mother   . Colon cancer Neg Hx   . Heart attack Neg Hx   . Hypertension Neg Hx   . Stroke Neg Hx   . Esophageal cancer Neg Hx   . Rectal cancer Neg Hx   . Stomach cancer Neg Hx   . Pancreatic cancer Neg Hx     Past Surgical History:  Procedure Laterality Date    . ADENOIDECTOMY    . CARPAL TUNNEL RELEASE     left   . CHOLECYSTECTOMY    . COLON RESECTION     18 inches  . COLON SURGERY  2013  . CORONARY ANGIOPLASTY     1985   . ELBOW BURSA SURGERY    . EYE SURGERY    . POLYPECTOMY    . SHOULDER SURGERY     Rt. Shoulder  . TONSILLECTOMY     Social History   Occupational History  . 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 Smoking status: Former Smoker    Last attempt to quit: 08/30/1983    Years since quitting: 34.6  . Smokeless tobacco: Former Systems developer  . Tobacco comment: Quit 30 years ago.  Substance and Sexual Activity  . Alcohol use: Yes    Comment: rare  . Drug use: No  . Sexual activity: Yes

## 2018-04-05 ENCOUNTER — Other Ambulatory Visit: Payer: Self-pay | Admitting: Family Medicine

## 2018-04-05 NOTE — Telephone Encounter (Signed)
Ok to refill??  Last office visit 03/08/2018.

## 2018-04-06 ENCOUNTER — Other Ambulatory Visit: Payer: Self-pay | Admitting: Family Medicine

## 2018-04-06 ENCOUNTER — Ambulatory Visit (INDEPENDENT_AMBULATORY_CARE_PROVIDER_SITE_OTHER): Payer: Medicare HMO | Admitting: Family Medicine

## 2018-04-06 DIAGNOSIS — E118 Type 2 diabetes mellitus with unspecified complications: Secondary | ICD-10-CM

## 2018-04-06 DIAGNOSIS — E7800 Pure hypercholesterolemia, unspecified: Secondary | ICD-10-CM

## 2018-04-06 DIAGNOSIS — E291 Testicular hypofunction: Secondary | ICD-10-CM

## 2018-04-06 DIAGNOSIS — I10 Essential (primary) hypertension: Secondary | ICD-10-CM

## 2018-04-06 DIAGNOSIS — I2510 Atherosclerotic heart disease of native coronary artery without angina pectoris: Secondary | ICD-10-CM

## 2018-04-06 DIAGNOSIS — E119 Type 2 diabetes mellitus without complications: Secondary | ICD-10-CM

## 2018-04-06 DIAGNOSIS — Z125 Encounter for screening for malignant neoplasm of prostate: Secondary | ICD-10-CM

## 2018-04-06 NOTE — Telephone Encounter (Signed)
[facility] with refill but he is due for ov with fasting labs prior to ov Cbc, cmp, flp, hga1c, microalbumin, and testosterone, with psa.

## 2018-04-06 NOTE — Telephone Encounter (Signed)
Called pt [facility] to schedule ov and lab visit - orders for labs placed.

## 2018-04-16 ENCOUNTER — Other Ambulatory Visit: Payer: Medicare HMO

## 2018-04-16 DIAGNOSIS — E7800 Pure hypercholesterolemia, unspecified: Secondary | ICD-10-CM | POA: Diagnosis not present

## 2018-04-16 DIAGNOSIS — I2510 Atherosclerotic heart disease of native coronary artery without angina pectoris: Secondary | ICD-10-CM

## 2018-04-16 DIAGNOSIS — E119 Type 2 diabetes mellitus without complications: Secondary | ICD-10-CM | POA: Diagnosis not present

## 2018-04-16 DIAGNOSIS — I10 Essential (primary) hypertension: Secondary | ICD-10-CM | POA: Diagnosis not present

## 2018-04-16 DIAGNOSIS — Z125 Encounter for screening for malignant neoplasm of prostate: Secondary | ICD-10-CM | POA: Diagnosis not present

## 2018-04-16 DIAGNOSIS — E291 Testicular hypofunction: Secondary | ICD-10-CM

## 2018-04-17 LAB — CBC WITH DIFFERENTIAL/PLATELET
BASOS PCT: 0.8 %
Basophils Absolute: 52 cells/uL (ref 0–200)
Eosinophils Absolute: 98 cells/uL (ref 15–500)
Eosinophils Relative: 1.5 %
HCT: 51.8 % — ABNORMAL HIGH (ref 38.5–50.0)
Hemoglobin: 16.9 g/dL (ref 13.2–17.1)
Lymphs Abs: 2236 cells/uL (ref 850–3900)
MCH: 28.7 pg (ref 27.0–33.0)
MCHC: 32.6 g/dL (ref 32.0–36.0)
MCV: 88.1 fL (ref 80.0–100.0)
MONOS PCT: 6.3 %
MPV: 10.1 fL (ref 7.5–12.5)
Neutro Abs: 3705 cells/uL (ref 1500–7800)
Neutrophils Relative %: 57 %
PLATELETS: 157 10*3/uL (ref 140–400)
RBC: 5.88 10*6/uL — AB (ref 4.20–5.80)
RDW: 14.6 % (ref 11.0–15.0)
TOTAL LYMPHOCYTE: 34.4 %
WBC mixed population: 410 cells/uL (ref 200–950)
WBC: 6.5 10*3/uL (ref 3.8–10.8)

## 2018-04-17 LAB — COMPREHENSIVE METABOLIC PANEL
AG RATIO: 2.1 (calc) (ref 1.0–2.5)
ALKALINE PHOSPHATASE (APISO): 56 U/L (ref 40–115)
ALT: 19 U/L (ref 9–46)
AST: 21 U/L (ref 10–35)
Albumin: 4.2 g/dL (ref 3.6–5.1)
BUN: 13 mg/dL (ref 7–25)
CHLORIDE: 100 mmol/L (ref 98–110)
CO2: 27 mmol/L (ref 20–32)
CREATININE: 1.02 mg/dL (ref 0.70–1.18)
Calcium: 9.8 mg/dL (ref 8.6–10.3)
GLOBULIN: 2 g/dL (ref 1.9–3.7)
Glucose, Bld: 168 mg/dL — ABNORMAL HIGH (ref 65–99)
Potassium: 4.1 mmol/L (ref 3.5–5.3)
Sodium: 138 mmol/L (ref 135–146)
Total Bilirubin: 0.6 mg/dL (ref 0.2–1.2)
Total Protein: 6.2 g/dL (ref 6.1–8.1)

## 2018-04-17 LAB — TESTOSTERONE: TESTOSTERONE: 583 ng/dL (ref 250–827)

## 2018-04-17 LAB — LIPID PANEL
Cholesterol: 149 mg/dL (ref <200)
HDL: 46 mg/dL (ref >40)
LDL CHOLESTEROL (CALC): 61 mg/dL
NON-HDL CHOLESTEROL (CALC): 103 mg/dL (ref <130)
TRIGLYCERIDES: 351 mg/dL — AB (ref <150)
Total CHOL/HDL Ratio: 3.2 (calc) (ref <5.0)

## 2018-04-17 LAB — PSA: PSA: 2.3 ng/mL (ref <=4.0)

## 2018-04-17 LAB — HEMOGLOBIN A1C
Hgb A1c MFr Bld: 7.1 % of total Hgb — ABNORMAL HIGH (ref <5.7)
MEAN PLASMA GLUCOSE: 157 (calc)
eAG (mmol/L): 8.7 (calc)

## 2018-04-17 LAB — MICROALBUMIN, URINE: MICROALB UR: 1.5 mg/dL

## 2018-04-19 ENCOUNTER — Ambulatory Visit: Payer: Medicare HMO | Admitting: Family Medicine

## 2018-04-19 ENCOUNTER — Encounter: Payer: Self-pay | Admitting: Family Medicine

## 2018-04-19 ENCOUNTER — Ambulatory Visit (INDEPENDENT_AMBULATORY_CARE_PROVIDER_SITE_OTHER): Payer: Medicare HMO | Admitting: Family Medicine

## 2018-04-19 VITALS — BP 138/84 | HR 78 | Temp 98.2°F | Resp 18 | Ht 71.0 in | Wt 259.0 lb

## 2018-04-19 DIAGNOSIS — M25561 Pain in right knee: Secondary | ICD-10-CM | POA: Diagnosis not present

## 2018-04-19 DIAGNOSIS — Z96651 Presence of right artificial knee joint: Secondary | ICD-10-CM

## 2018-04-19 DIAGNOSIS — E118 Type 2 diabetes mellitus with unspecified complications: Secondary | ICD-10-CM | POA: Diagnosis not present

## 2018-04-19 DIAGNOSIS — G8929 Other chronic pain: Secondary | ICD-10-CM | POA: Diagnosis not present

## 2018-04-19 DIAGNOSIS — I2510 Atherosclerotic heart disease of native coronary artery without angina pectoris: Secondary | ICD-10-CM

## 2018-04-19 DIAGNOSIS — E7800 Pure hypercholesterolemia, unspecified: Secondary | ICD-10-CM

## 2018-04-19 DIAGNOSIS — I10 Essential (primary) hypertension: Secondary | ICD-10-CM | POA: Diagnosis not present

## 2018-04-19 DIAGNOSIS — Z125 Encounter for screening for malignant neoplasm of prostate: Secondary | ICD-10-CM | POA: Diagnosis not present

## 2018-04-19 DIAGNOSIS — E291 Testicular hypofunction: Secondary | ICD-10-CM

## 2018-04-19 NOTE — Progress Notes (Signed)
Subjective:    Patient ID: Cameron Daniel, male    DOB: Mar 30, 1946, 72 y.o.   MRN: [id number]  HPI Patient presents today to discuss his chronic medical conditions including diabetes, hypertension, hyperlipidemia, and hypogonadism.  He is also requesting a referral to an orthopedic surgeon.  He has long-standing osteoarthritis in his right knee and is contemplating right-sided knee replacement.  He specifically request to see Dr. Maureen Ralphs.  Regarding his hypertension, his blood pressures controlled at 138/84.  He denies any chest pain shortness of breath or dyspnea on exertion.  Regarding his hyperlipidemia, patient's goal LDL cholesterol is less than 70 given his history of coronary artery disease.  His current LDL cholesterol is 61.  He denies any myalgias or right upper quadrant pain on his statin.  He seems to be tolerating the medication well.  His most recent hemoglobin A1c is 7.1.  He denies any polyuria, polydipsia, or blurry vision.  He does admit to having significant dietary indiscretion.  He admits that he eats cakes and cookies on a daily basis and he also eats a lot of biscuits and bread. Past Medical History:  Diagnosis Date  . Allergic rhinitis, cause unspecified   . Allergy   . Anxiety   . Arthritis   . Cancer ([place])   . Cataract   . Coronary atherosclerosis of unspecified type of vessel, native or graft    1985 , arthroplasty x 1   . Depression    PTSD   . Diverticulosis of colon (without mention of hemorrhage)   . Esophageal reflux   . Esophageal stricture   . Flushing   . Hemorrhoids   . Irritable bowel syndrome   . Myocardial infarction ([place])   . Nocturia   . Obesity, unspecified   . Other acquired absence of organ   . Other and unspecified hyperlipidemia   . Other malaise and fatigue   . Personal history of colonic polyps   . Psychosexual dysfunction with inhibited sexual excitement   . Type II or unspecified type diabetes mellitus without mention of  complication, not stated as uncontrolled   . Unspecified essential hypertension    Past Surgical History:  Procedure Laterality Date  . ADENOIDECTOMY    . CARPAL TUNNEL RELEASE     left   . CHOLECYSTECTOMY    . COLON RESECTION     18 inches  . COLON SURGERY  2013  . CORONARY ANGIOPLASTY     1985   . ELBOW BURSA SURGERY    . EYE SURGERY    . POLYPECTOMY    . SHOULDER SURGERY     Rt. Shoulder  . TONSILLECTOMY     Current Outpatient Medications on File Prior to Visit  Medication Sig Dispense Refill  . aspirin 81 MG tablet Take 81 mg by mouth every morning.     Marland Kitchen azelastine (ASTELIN) 0.1 % nasal spray Place 2 sprays into both nostrils 2 (two) times daily. Use in each nostril as directed 30 mL 5  . Cholecalciferol (VITAMIN D3) 1000 units CAPS Take 1,000 Units by mouth daily.    . citalopram (CELEXA) 10 MG tablet Take 2 tablets (20 mg total) by mouth daily. 180 tablet 3  . colestipol (COLESTID) 5 g packet Take 5 g by mouth 2 (two) times daily.    . fexofenadine (ALLEGRA) 180 MG tablet Take 180 mg by mouth daily as needed. For allergies    . finasteride (PROSCAR) 5 MG tablet Take 1 tablet (5 mg  total) by mouth daily. 90 tablet 3  . glipiZIDE (GLUCOTROL) 5 MG tablet Take 1 tablet (5 mg total) by mouth daily before breakfast. 90 tablet 1  . meloxicam (MOBIC) 15 MG tablet Take 15 mg by mouth daily.    . Multiple Vitamin (MULTI-VITAMINS) TABS Take 1 capsule by mouth daily.    Marland Kitchen olmesartan (BENICAR) 20 MG tablet Take 1 tablet (20 mg total) by mouth daily. 90 tablet 3  . omega-3 acid ethyl esters (LOVAZA) 1 g capsule Take 1 capsule (1 g total) by mouth daily. 90 capsule 2  . pantoprazole (PROTONIX) 40 MG tablet TAKE 1 TABLET (40 MG TOTAL) BY MOUTH EVERY MORNING. 90 tablet 3  . pioglitazone (ACTOS) 15 MG tablet TAKE 1 TABLET BY MOUTH EVERY DAY IN THE MORNING 90 tablet 2  . rosuvastatin (CRESTOR) 10 MG tablet TAKE 1 TABLET (10 MG TOTAL) BY MOUTH DAILY. 90 tablet 3  . testosterone cypionate  (DEPOTESTOSTERONE CYPIONATE) 200 MG/ML injection INJECT 1 ML EVERY 14 DAYS  1  . testosterone cypionate (DEPOTESTOSTERONE CYPIONATE) 200 MG/ML injection INJECT 1 ML EVERY 14 DAYS 2 mL 3   Current Facility-Administered Medications on File Prior to Visit  Medication Dose Route Frequency Provider Last Rate Last Dose  . 0.9 %  sodium chloride infusion  500 mL Intravenous Continuous Irene Shipper, MD       Allergies  Allergen Reactions  . Doxazosin Mesylate     Reaction=unknown  . Doxazosin Mesylate Other (See Comments)  . Other Other (See Comments)  . Flomax [Tamsulosin Hcl]     Itching   . Jardiance [Empagliflozin] Other (See Comments)    Nausea, dizziness - Severe (per pt)  . Penicillins Rash and Other (See Comments)    Has patient had a PCN reaction causing immediate rash, facial/tongue/throat swelling, SOB or lightheadedness with hypotension: No Has patient had a PCN reaction causing severe rash involving mucus membranes or skin necrosis: No Has patient had a PCN reaction that required hospitalization No Has patient had a PCN reaction occurring within the last 10 years: No If all of the above answers are "NO", then may proceed with Cephalosporin use.   . Sulfonamide Derivatives Rash  . Trulicity [Dulaglutide] Nausea And Vomiting    N&V, Dizziness   Social History   Socioeconomic History  . Marital status: Married    Spouse name: Not on file  . Number of children: 1  . Years of education: Not on file  . Highest education level: Not on file  Occupational History  . 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 Financial resource strain: Not on file  . Food insecurity:    Worry: Not on file    Inability: Not on file  . Transportation needs:    Medical: Not on file    Non-medical: Not on file  Tobacco Use  . Smoking status: Former Smoker    Last attempt to quit: 08/30/1983    Years since quitting: 34.6  . Smokeless tobacco: Former Systems developer  . Tobacco comment: Quit 30 years ago.    Substance and Sexual Activity  . Alcohol use: Yes    Comment: rare  . Drug use: No  . Sexual activity: Yes  Lifestyle  . Physical activity:    Days per week: Not on file    Minutes per session: Not on file  . Stress: Not on file  Relationships  . Social connections:    Talks on phone: Not on file    Gets together:  Not on file    Attends religious service: Not on file    Active member of club or organization: Not on file    Attends meetings of clubs or organizations: Not on file    Relationship status: Not on file  . Intimate partner violence:    Fear of current or ex partner: Not on file    Emotionally abused: Not on file    Physically abused: Not on file    Forced sexual activity: Not on file  Other Topics Concern  . Not on file  Social History Narrative  . Not on file      Review of Systems  All other systems reviewed and are negative.      Objective:   Physical Exam  Neck: Neck supple. No thyromegaly present.  Cardiovascular: Normal rate, regular rhythm, normal heart sounds and intact distal pulses. Exam reveals no gallop and no friction rub.  No murmur heard. Pulmonary/Chest: Effort normal and breath sounds normal. No respiratory distress. He has no wheezes. He has no rales. He exhibits no tenderness.  Abdominal: Soft. Bowel sounds are normal. He exhibits no distension and no mass. There is no tenderness. There is no rebound and no guarding. Hernia confirmed negative in the right inguinal area and confirmed negative in the left inguinal area.  Lymphadenopathy:       Right: No inguinal adenopathy present.       Left: No inguinal adenopathy present.  Vitals reviewed.         Assessment & Plan:  Controlled type 2 diabetes mellitus with complication, without long-term current use of insulin (HCC)  Hypogonadism in male  Prostate cancer screening  Pure hypercholesterolemia  Benign essential HTN  ASCVD (arteriosclerotic cardiovascular  disease)  Appointment on 04/16/2018  Component Date Value Ref Range Status  . Testosterone 04/16/2018 583  250 - 827 ng/dL Final  . Microalb, Ur 04/16/2018 1.5  mg/dL Final   Comment: Reference Range Not established   . RAM 04/16/2018    Final   Comment: . The ADA defines abnormalities in albumin excretion as follows: Marland Kitchen Category         Result (mcg/mg creatinine) . Normal                    <30 Microalbuminuria         30-299  Clinical albuminuria   > OR = 300 . The ADA recommends that at least two of three specimens collected within a 3-6 month period be abnormal before considering a patient to be within a diagnostic category.   . Hgb A1c MFr Bld 04/16/2018 7.1* <5.7 % of total Hgb Final   Comment: For someone without known diabetes, a hemoglobin A1c value of 6.5% or greater indicates that they may have  diabetes and this should be confirmed with a follow-up  test. . For someone with known diabetes, a value <7% indicates  that their diabetes is well controlled and a value  greater than or equal to 7% indicates suboptimal  control. A1c targets should be individualized based on  duration of diabetes, age, comorbid conditions, and  other considerations. . Currently, no consensus exists regarding use of hemoglobin A1c for diagnosis of diabetes for children. .   . Mean Plasma Glucose 04/16/2018 157  (calc) Final  . eAG (mmol/L) 04/16/2018 8.7  (calc) Final  . PSA 04/16/2018 2.3  < OR = 4.0 ng/mL Final   Comment: The total PSA value from this assay system is  standardized against the WHO standard. The test  result will be approximately 20% lower when compared  to the equimolar-standardized total PSA (Beckman  Coulter). Comparison of serial PSA results should be  interpreted with this fact in mind. . This test was performed using the Siemens  chemiluminescent method. Values obtained from  different assay methods cannot be used interchangeably. PSA levels, regardless  of value, should not be interpreted as absolute evidence of the presence or absence of disease.   . Cholesterol 04/16/2018 149  <200 mg/dL Final  . HDL 04/16/2018 46  >40 mg/dL Final  . Triglycerides 04/16/2018 351* <150 mg/dL Final   Comment: . If a non-fasting specimen was collected, consider repeat triglyceride testing on a fasting specimen if clinically indicated.  Yates Decamp et al. J. of Clin. Lipidol. 1093;2:355-732. .   . LDL Cholesterol (Calc) 04/16/2018 61  mg/dL (calc) Final   Comment: Reference range: <100 . Desirable range <100 mg/dL for primary prevention;   <70 mg/dL for patients with CHD or diabetic patients  with > or = 2 CHD risk factors. Marland Kitchen LDL-C is now calculated using the Martin-Hopkins  calculation, which is a validated novel method providing  better accuracy than the Friedewald equation in the  estimation of LDL-C.  Cresenciano Genre et al. Annamaria Helling. 2025;427(06): 2061-2068  (http://education.[facility].com/faq/FAQ164)   . Total CHOL/HDL Ratio 04/16/2018 3.2  <5.0 (calc) Final  . Non-HDL Cholesterol (Calc) 04/16/2018 103  <130 mg/dL (calc) Final   Comment: For patients with diabetes plus 1 major ASCVD risk  factor, treating to a non-HDL-C goal of <100 mg/dL  (LDL-C of <70 mg/dL) is considered a therapeutic  option.   . Glucose, Bld 04/16/2018 168* 65 - 99 mg/dL Final   Comment: .            Fasting reference interval . For someone without known diabetes, a glucose value >125 mg/dL indicates that they may have diabetes and this should be confirmed with a follow-up test. .   . BUN 04/16/2018 13  7 - 25 mg/dL Final  . Creat 04/16/2018 1.02  0.70 - 1.18 mg/dL Final   Comment: For patients >42 years of age, the reference limit for Creatinine is approximately 13% higher for people identified as African-American. .   Havery Moros Ratio 23/76/2831 NOT APPLICABLE  6 - 22 (calc) Final  . Sodium 04/16/2018 138  135 - 146 mmol/L Final  . Potassium 04/16/2018  4.1  3.5 - 5.3 mmol/L Final  . Chloride 04/16/2018 100  98 - 110 mmol/L Final  . CO2 04/16/2018 27  20 - 32 mmol/L Final  . Calcium 04/16/2018 9.8  8.6 - 10.3 mg/dL Final  . Total Protein 04/16/2018 6.2  6.1 - 8.1 g/dL Final  . Albumin 04/16/2018 4.2  3.6 - 5.1 g/dL Final  . Globulin 04/16/2018 2.0  1.9 - 3.7 g/dL (calc) Final  . AG Ratio 04/16/2018 2.1  1.0 - 2.5 (calc) Final  . Total Bilirubin 04/16/2018 0.6  0.2 - 1.2 mg/dL Final  . Alkaline phosphatase (APISO) 04/16/2018 56  40 - 115 U/L Final  . AST 04/16/2018 21  10 - 35 U/L Final  . ALT 04/16/2018 19  9 - 46 U/L Final  . WBC 04/16/2018 6.5  3.8 - 10.8 Thousand/uL Final  . RBC 04/16/2018 5.88* 4.20 - 5.80 Million/uL Final  . Hemoglobin 04/16/2018 16.9  13.2 - 17.1 g/dL Final  . HCT 04/16/2018 51.8* 38.5 - 50.0 % Final  . MCV 04/16/2018 88.1  80.0 -  100.0 fL Final  . MCH 04/16/2018 28.7  27.0 - 33.0 pg Final  . MCHC 04/16/2018 32.6  32.0 - 36.0 g/dL Final  . RDW 04/16/2018 14.6  11.0 - 15.0 % Final  . Platelets 04/16/2018 157  140 - 400 Thousand/uL Final  . MPV 04/16/2018 10.1  7.5 - 12.5 fL Final  . Neutro Abs 04/16/2018 3,705  1,500 - 7,800 cells/uL Final  . Lymphs Abs 04/16/2018 2,236  850 - 3,900 cells/uL Final  . WBC mixed population 04/16/2018 410  200 - 950 cells/uL Final  . Eosinophils Absolute 04/16/2018 98  15 - 500 cells/uL Final  . Basophils Absolute 04/16/2018 52  0 - 200 cells/uL Final  . Neutrophils Relative % 04/16/2018 57  % Final  . Total Lymphocyte 04/16/2018 34.4  % Final  . Monocytes Relative 04/16/2018 6.3  % Final  . Eosinophils Relative 04/16/2018 1.5  % Final  . Basophils Relative 04/16/2018 0.8  % Final   Labs are significant for hemoglobin A1c of 7.1.  We discussed medication options but I have recommended 10 to 15 pounds of weight loss and a low carbohydrate diet and then reassess in 6 months.  His LDL cholesterol is at goal.  His blood pressure is at goal.  His labs are significant for an elevated  hemoglobin and hematocrit.  He does not smoke.  I suspect that the patient may have mild reactive erythrocytosis possibly due to mild sleep apnea.  He denies any hypersomnolence or snoring or apnea episodes.  I recommended that we monitor him for this and discuss it with his wife when he gets home to see if she is noticed apneic episodes or snoring.  We will monitor his hemoglobin for the time being.  I will gladly consult orthopedics regarding his right knee pain

## 2018-04-24 ENCOUNTER — Ambulatory Visit (INDEPENDENT_AMBULATORY_CARE_PROVIDER_SITE_OTHER): Payer: Medicare HMO | Admitting: Family Medicine

## 2018-04-24 ENCOUNTER — Encounter: Payer: Self-pay | Admitting: Family Medicine

## 2018-04-24 VITALS — BP 138/74 | HR 84 | Temp 98.0°F | Resp 16 | Ht 71.0 in | Wt 259.0 lb

## 2018-04-24 DIAGNOSIS — L723 Sebaceous cyst: Secondary | ICD-10-CM

## 2018-04-24 DIAGNOSIS — E291 Testicular hypofunction: Secondary | ICD-10-CM | POA: Diagnosis not present

## 2018-04-24 MED ORDER — TESTOSTERONE CYPIONATE 200 MG/ML IM SOLN
200.0000 mg | Freq: Once | INTRAMUSCULAR | Status: AC
  Administered 2018-04-24: 200 mg via INTRAMUSCULAR

## 2018-04-24 NOTE — Progress Notes (Signed)
Subjective:    Patient ID: Cameron Daniel, male    DOB: 04-19-1946, 72 y.o.   MRN: [id number]  HPI Patient had a previous visit mention to cyst on his back that he ask [place] to remove.  One is located on the superior aspect of his posterior right shoulder.  It is 1.1 cm in diameter.  It is soft, round, well demarcated and spongy like a sebaceous cyst.  He has a very small 5 mm cyst on his left shoulder.  He presents today for excision Past Medical History:  Diagnosis Date  . Allergic rhinitis, cause unspecified   . Allergy   . Anxiety   . Arthritis   . Cancer ([place])   . Cataract   . Coronary atherosclerosis of unspecified type of vessel, native or graft    1985 , arthroplasty x 1   . Depression    PTSD   . Diverticulosis of colon (without mention of hemorrhage)   . Esophageal reflux   . Esophageal stricture   . Flushing   . Hemorrhoids   . Irritable bowel syndrome   . Myocardial infarction ([place])   . Nocturia   . Obesity, unspecified   . Other acquired absence of organ   . Other and unspecified hyperlipidemia   . Other malaise and fatigue   . Personal history of colonic polyps   . Psychosexual dysfunction with inhibited sexual excitement   . Type II or unspecified type diabetes mellitus without mention of complication, not stated as uncontrolled   . Unspecified essential hypertension    Past Surgical History:  Procedure Laterality Date  . ADENOIDECTOMY    . CARPAL TUNNEL RELEASE     left   . CHOLECYSTECTOMY    . COLON RESECTION     18 inches  . COLON SURGERY  2013  . CORONARY ANGIOPLASTY     1985   . ELBOW BURSA SURGERY    . EYE SURGERY    . POLYPECTOMY    . SHOULDER SURGERY     Rt. Shoulder  . TONSILLECTOMY     Current Outpatient Medications on File Prior to Visit  Medication Sig Dispense Refill  . aspirin 81 MG tablet Take 81 mg by mouth every morning.     Marland Kitchen azelastine (ASTELIN) 0.1 % nasal spray Place 2 sprays into both nostrils 2 (two) times daily. Use in  each nostril as directed 30 mL 5  . Cholecalciferol (VITAMIN D3) 1000 units CAPS Take 1,000 Units by mouth daily.    . citalopram (CELEXA) 10 MG tablet Take 2 tablets (20 mg total) by mouth daily. 180 tablet 3  . colestipol (COLESTID) 5 g packet Take 5 g by mouth 2 (two) times daily.    . fexofenadine (ALLEGRA) 180 MG tablet Take 180 mg by mouth daily as needed. For allergies    . finasteride (PROSCAR) 5 MG tablet Take 1 tablet (5 mg total) by mouth daily. 90 tablet 3  . glipiZIDE (GLUCOTROL) 5 MG tablet Take 1 tablet (5 mg total) by mouth daily before breakfast. 90 tablet 1  . meloxicam (MOBIC) 15 MG tablet Take 15 mg by mouth daily.    . Multiple Vitamin (MULTI-VITAMINS) TABS Take 1 capsule by mouth daily.    Marland Kitchen olmesartan (BENICAR) 20 MG tablet Take 1 tablet (20 mg total) by mouth daily. 90 tablet 3  . omega-3 acid ethyl esters (LOVAZA) 1 g capsule Take 1 capsule (1 g total) by mouth daily. 90 capsule 2  .  pantoprazole (PROTONIX) 40 MG tablet TAKE 1 TABLET (40 MG TOTAL) BY MOUTH EVERY MORNING. 90 tablet 3  . pioglitazone (ACTOS) 15 MG tablet TAKE 1 TABLET BY MOUTH EVERY DAY IN THE MORNING 90 tablet 2  . rosuvastatin (CRESTOR) 10 MG tablet TAKE 1 TABLET (10 MG TOTAL) BY MOUTH DAILY. 90 tablet 3  . testosterone cypionate (DEPOTESTOSTERONE CYPIONATE) 200 MG/ML injection INJECT 1 ML EVERY 14 DAYS  1  . testosterone cypionate (DEPOTESTOSTERONE CYPIONATE) 200 MG/ML injection INJECT 1 ML EVERY 14 DAYS 2 mL 3   Current Facility-Administered Medications on File Prior to Visit  Medication Dose Route Frequency Provider Last Rate Last Dose  . 0.9 %  sodium chloride infusion  500 mL Intravenous Continuous Irene Shipper, MD       Allergies  Allergen Reactions  . Doxazosin Mesylate     Reaction=unknown  . Doxazosin Mesylate Other (See Comments)  . Other Other (See Comments)  . Flomax [Tamsulosin Hcl]     Itching   . Jardiance [Empagliflozin] Other (See Comments)    Nausea, dizziness - Severe (per  pt)  . Penicillins Rash and Other (See Comments)    Has patient had a PCN reaction causing immediate rash, facial/tongue/throat swelling, SOB or lightheadedness with hypotension: No Has patient had a PCN reaction causing severe rash involving mucus membranes or skin necrosis: No Has patient had a PCN reaction that required hospitalization No Has patient had a PCN reaction occurring within the last 10 years: No If all of the above answers are "NO", then may proceed with Cephalosporin use.   . Sulfonamide Derivatives Rash  . Trulicity [Dulaglutide] Nausea And Vomiting    N&V, Dizziness   Social History   Socioeconomic History  . Marital status: Married    Spouse name: Not on file  . Number of children: 1  . Years of education: Not on file  . Highest education level: Not on file  Occupational History  . 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 Financial resource strain: Not on file  . Food insecurity:    Worry: Not on file    Inability: Not on file  . Transportation needs:    Medical: Not on file    Non-medical: Not on file  Tobacco Use  . Smoking status: Former Smoker    Last attempt to quit: 08/30/1983    Years since quitting: 34.6  . Smokeless tobacco: Former Systems developer  . Tobacco comment: Quit 30 years ago.  Substance and Sexual Activity  . Alcohol use: Yes    Comment: rare  . Drug use: No  . Sexual activity: Yes  Lifestyle  . Physical activity:    Days per week: Not on file    Minutes per session: Not on file  . Stress: Not on file  Relationships  . Social connections:    Talks on phone: Not on file    Gets together: Not on file    Attends religious service: Not on file    Active member of club or organization: Not on file    Attends meetings of clubs or organizations: Not on file    Relationship status: Not on file  . Intimate partner violence:    Fear of current or ex partner: Not on file    Emotionally abused: Not on file    Physically abused: Not on file    Forced  sexual activity: Not on file  Other Topics Concern  . Not on file  Social History Narrative  .  Not on file      Review of Systems  All other systems reviewed and are negative.      Objective:   Physical Exam  Cardiovascular: Normal rate and regular rhythm.  Pulmonary/Chest: Effort normal and breath sounds normal.  Skin: Lesion noted.     Vitals reviewed.  See the history and present illness to describe the location and size of the cyst as shown on the diagram       Assessment & Plan:  Sebaceous cyst  Each cyst was anesthetized with 0.1% lidocaine with epinephrine.  The patient was then prepped and draped in sterile fashion.  A 1.5 x 1 cm elliptical excision was performed around the right sided sebaceous cyst down to the underlying fascia.  The cyst was removed in its entirety.  The skin edges were then approximated with 3, simple interrupted Ethilon sutures 3-0.  Next, using a 5 mm punch biopsy, the entire cyst on the posterior aspect of the left shoulder was removed using a punch biopsy technique.  The skin edges were approximated with 1, simple interrupted 3-0 Ethilon suture.  Patient is instructed to return in 1 week for suture removal

## 2018-04-24 NOTE — Addendum Note (Signed)
Addended by: Shary Decamp B on: 04/24/2018 04:36 PM   Modules accepted: Orders

## 2018-05-01 ENCOUNTER — Ambulatory Visit (INDEPENDENT_AMBULATORY_CARE_PROVIDER_SITE_OTHER): Payer: Medicare HMO | Admitting: Family Medicine

## 2018-05-01 DIAGNOSIS — Z4802 Encounter for removal of sutures: Secondary | ICD-10-CM

## 2018-05-01 NOTE — Progress Notes (Signed)
Subjective:    Patient ID: Cameron Daniel, male    DOB: 08/18/1946, 72 y.o.   MRN: [id number]  HPI  04/24/18 Patient had a previous visit mention to cyst on his back that he ask [place] to remove.  One is located on the superior aspect of his posterior right shoulder.  It is 1.1 cm in diameter.  It is soft, round, well demarcated and spongy like a sebaceous cyst.  He has a very small 5 mm cyst on his left shoulder.  He presents today for excision.  At that time, my plan was: Each cyst was anesthetized with 0.1% lidocaine with epinephrine.  The patient was then prepped and draped in sterile fashion.  A 1.5 x 1 cm elliptical excision was performed around the right sided sebaceous cyst down to the underlying fascia.  The cyst was removed in its entirety.  The skin edges were then approximated with 3, simple interrupted Ethilon sutures 3-0.  Next, using a 5 mm punch biopsy, the entire cyst on the posterior aspect of the left shoulder was removed using a punch biopsy technique.  The skin edges were approximated with 1, simple interrupted 3-0 Ethilon suture.  Patient is instructed to return in 1 week for suture removal  05/01/18 Here for suture removal.   Past Medical History:  Diagnosis Date  . Allergic rhinitis, cause unspecified   . Allergy   . Anxiety   . Arthritis   . Cancer ([place])   . Cataract   . Coronary atherosclerosis of unspecified type of vessel, native or graft    1985 , arthroplasty x 1   . Depression    PTSD   . Diverticulosis of colon (without mention of hemorrhage)   . Esophageal reflux   . Esophageal stricture   . Flushing   . Hemorrhoids   . Irritable bowel syndrome   . Myocardial infarction ([place])   . Nocturia   . Obesity, unspecified   . Other acquired absence of organ   . Other and unspecified hyperlipidemia   . Other malaise and fatigue   . Personal history of colonic polyps   . Psychosexual dysfunction with inhibited sexual excitement   . Type II or unspecified type  diabetes mellitus without mention of complication, not stated as uncontrolled   . Unspecified essential hypertension    Past Surgical History:  Procedure Laterality Date  . ADENOIDECTOMY    . CARPAL TUNNEL RELEASE     left   . CHOLECYSTECTOMY    . COLON RESECTION     18 inches  . COLON SURGERY  2013  . CORONARY ANGIOPLASTY     1985   . ELBOW BURSA SURGERY    . EYE SURGERY    . POLYPECTOMY    . SHOULDER SURGERY     Rt. Shoulder  . TONSILLECTOMY     Current Outpatient Medications on File Prior to Visit  Medication Sig Dispense Refill  . aspirin 81 MG tablet Take 81 mg by mouth every morning.     Marland Kitchen azelastine (ASTELIN) 0.1 % nasal spray Place 2 sprays into both nostrils 2 (two) times daily. Use in each nostril as directed 30 mL 5  . Cholecalciferol (VITAMIN D3) 1000 units CAPS Take 1,000 Units by mouth daily.    . citalopram (CELEXA) 10 MG tablet Take 2 tablets (20 mg total) by mouth daily. 180 tablet 3  . colestipol (COLESTID) 5 g packet Take 5 g by mouth 2 (two) times daily.    Marland Kitchen  fexofenadine (ALLEGRA) 180 MG tablet Take 180 mg by mouth daily as needed. For allergies    . finasteride (PROSCAR) 5 MG tablet Take 1 tablet (5 mg total) by mouth daily. 90 tablet 3  . glipiZIDE (GLUCOTROL) 5 MG tablet Take 1 tablet (5 mg total) by mouth daily before breakfast. 90 tablet 1  . meloxicam (MOBIC) 15 MG tablet Take 15 mg by mouth daily.    . Multiple Vitamin (MULTI-VITAMINS) TABS Take 1 capsule by mouth daily.    Marland Kitchen olmesartan (BENICAR) 20 MG tablet Take 1 tablet (20 mg total) by mouth daily. 90 tablet 3  . omega-3 acid ethyl esters (LOVAZA) 1 g capsule Take 1 capsule (1 g total) by mouth daily. 90 capsule 2  . pantoprazole (PROTONIX) 40 MG tablet TAKE 1 TABLET (40 MG TOTAL) BY MOUTH EVERY MORNING. 90 tablet 3  . pioglitazone (ACTOS) 15 MG tablet TAKE 1 TABLET BY MOUTH EVERY DAY IN THE MORNING 90 tablet 2  . rosuvastatin (CRESTOR) 10 MG tablet TAKE 1 TABLET (10 MG TOTAL) BY MOUTH DAILY. 90  tablet 3  . testosterone cypionate (DEPOTESTOSTERONE CYPIONATE) 200 MG/ML injection INJECT 1 ML EVERY 14 DAYS  1  . testosterone cypionate (DEPOTESTOSTERONE CYPIONATE) 200 MG/ML injection INJECT 1 ML EVERY 14 DAYS 2 mL 3   Current Facility-Administered Medications on File Prior to Visit  Medication Dose Route Frequency Provider Last Rate Last Dose  . 0.9 %  sodium chloride infusion  500 mL Intravenous Continuous Irene Shipper, MD       Allergies  Allergen Reactions  . Doxazosin Mesylate     Reaction=unknown  . Doxazosin Mesylate Other (See Comments)  . Other Other (See Comments)  . Flomax [Tamsulosin Hcl]     Itching   . Jardiance [Empagliflozin] Other (See Comments)    Nausea, dizziness - Severe (per pt)  . Penicillins Rash and Other (See Comments)    Has patient had a PCN reaction causing immediate rash, facial/tongue/throat swelling, SOB or lightheadedness with hypotension: No Has patient had a PCN reaction causing severe rash involving mucus membranes or skin necrosis: No Has patient had a PCN reaction that required hospitalization No Has patient had a PCN reaction occurring within the last 10 years: No If all of the above answers are "NO", then may proceed with Cephalosporin use.   . Sulfonamide Derivatives Rash  . Trulicity [Dulaglutide] Nausea And Vomiting    N&V, Dizziness   Social History   Socioeconomic History  . Marital status: Married    Spouse name: Not on file  . Number of children: 1  . Years of education: Not on file  . Highest education level: Not on file  Occupational History  . 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 Financial resource strain: Not on file  . Food insecurity:    Worry: Not on file    Inability: Not on file  . Transportation needs:    Medical: Not on file    Non-medical: Not on file  Tobacco Use  . Smoking status: Former Smoker    Last attempt to quit: 08/30/1983    Years since quitting: 34.6  . Smokeless tobacco: Former Systems developer  . Tobacco  comment: Quit 30 years ago.  Substance and Sexual Activity  . Alcohol use: Yes    Comment: rare  . Drug use: No  . Sexual activity: Yes  Lifestyle  . Physical activity:    Days per week: Not on file    Minutes per session: Not  on file  . Stress: Not on file  Relationships  . Social connections:    Talks on phone: Not on file    Gets together: Not on file    Attends religious service: Not on file    Active member of club or organization: Not on file    Attends meetings of clubs or organizations: Not on file    Relationship status: Not on file  . Intimate partner violence:    Fear of current or ex partner: Not on file    Emotionally abused: Not on file    Physically abused: Not on file    Forced sexual activity: Not on file  Other Topics Concern  . Not on file  Social History Narrative  . Not on file      Review of Systems  All other systems reviewed and are negative.      Objective:   Physical Exam  Cardiovascular: Normal rate and regular rhythm.  Pulmonary/Chest: Effort normal and breath sounds normal.  Skin: Lesion noted.     Vitals reviewed. Surgical sites are well-healed.  No evidence of cellulitis.  3 sutures removed from the posterior aspect of the right shoulder without difficulty.  One suture removed from the posterior aspect of the left shoulder without difficulty.  Wounds covered with a Band-Aid        Assessment & Plan:  Suture removal.  4 sutures removed without difficulty.  Follow-up PRN

## 2018-05-07 ENCOUNTER — Ambulatory Visit (INDEPENDENT_AMBULATORY_CARE_PROVIDER_SITE_OTHER): Payer: Medicare HMO

## 2018-05-07 DIAGNOSIS — E291 Testicular hypofunction: Secondary | ICD-10-CM | POA: Diagnosis not present

## 2018-05-07 DIAGNOSIS — Z23 Encounter for immunization: Secondary | ICD-10-CM | POA: Diagnosis not present

## 2018-05-07 MED ORDER — TESTOSTERONE CYPIONATE 200 MG/ML IM SOLN
200.0000 mg | Freq: Once | INTRAMUSCULAR | Status: AC
  Administered 2018-05-07: 200 mg via INTRAMUSCULAR

## 2018-05-07 NOTE — Progress Notes (Signed)
Patient was in office to get  both a flu vaccine and a testosterone injection. Patient received the flu vaccine in his left deltoid, testosterone in the right ventrogluteal. Patient tolerated well.

## 2018-05-25 DIAGNOSIS — M1711 Unilateral primary osteoarthritis, right knee: Secondary | ICD-10-CM | POA: Diagnosis not present

## 2018-05-25 DIAGNOSIS — M25361 Other instability, right knee: Secondary | ICD-10-CM | POA: Diagnosis not present

## 2018-05-28 ENCOUNTER — Encounter: Payer: Self-pay | Admitting: Allergy and Immunology

## 2018-05-28 ENCOUNTER — Ambulatory Visit (INDEPENDENT_AMBULATORY_CARE_PROVIDER_SITE_OTHER): Payer: PRIVATE HEALTH INSURANCE | Admitting: Allergy and Immunology

## 2018-05-28 ENCOUNTER — Ambulatory Visit (INDEPENDENT_AMBULATORY_CARE_PROVIDER_SITE_OTHER): Payer: Medicare HMO | Admitting: Family Medicine

## 2018-05-28 DIAGNOSIS — J3089 Other allergic rhinitis: Secondary | ICD-10-CM | POA: Diagnosis not present

## 2018-05-28 DIAGNOSIS — E291 Testicular hypofunction: Secondary | ICD-10-CM | POA: Diagnosis not present

## 2018-05-28 DIAGNOSIS — H1013 Acute atopic conjunctivitis, bilateral: Secondary | ICD-10-CM

## 2018-05-28 MED ORDER — AZELASTINE HCL 0.1 % NA SOLN: 2.0000 | Freq: Two times a day (BID) | NASAL | 5 refills | Status: DC

## 2018-05-28 MED ORDER — FEXOFENADINE HCL 180 MG PO TABS: 180.0000 mg | ORAL_TABLET | Freq: Every day | ORAL | 0 refills | Status: DC | PRN

## 2018-05-28 MED ORDER — TESTOSTERONE CYPIONATE 200 MG/ML IM SOLN
200.0000 mg | INTRAMUSCULAR | Status: DC
  Administered 2018-05-28 – 2019-04-02 (×16): 200 mg via INTRAMUSCULAR

## 2018-05-28 NOTE — Assessment & Plan Note (Signed)
   Continue appropriate allergen avoidance measures and Pazeo eyedrops as needed.

## 2018-05-28 NOTE — Assessment & Plan Note (Signed)
Well-controlled.  Continue appropriate allergen avoidance measures, fexofenadine (Allegra) 180 mg daily as needed, and fluticasone nasal spray as needed.  Nasal saline spray (i.e., Simply Saline) or nasal saline lavage (i.e., NeilMed) is recommended as needed and prior to medicated nasal sprays.

## 2018-05-28 NOTE — Progress Notes (Signed)
Follow-up Note  RE: MOMEN HAM MRN: [id number] DOB: 09-Jun-1946 Date of Office Visit: 05/28/2018  Primary care provider: [name], MD Referring provider: Georgiana Shore., P*  History of present illness: Cameron Daniel is a 72 y.o. male with allergic rhinoconjunctivitis presenting today for follow-up.  He is previously seen in this clinic for his initial evaluation on Jan 23, 2018.  He reports that he has experienced nasal and ocular allergy symptom reduction in the interval since his previous visit.  He is currently taking fexofenadine daily and fluticasone nasal spray a few times per week on average.  He reports that his ocular pruritus is improved significantly while using Pazeo eyedrops.  He has no complaints today.  Assessment and plan: Allergic rhinitis Well-controlled.  Continue appropriate allergen avoidance measures, fexofenadine (Allegra) 180 mg daily as needed, and fluticasone nasal spray as needed.  Nasal saline spray (i.e., Simply Saline) or nasal saline lavage (i.e., NeilMed) is recommended as needed and prior to medicated nasal sprays.  Allergic conjunctivitis  Continue appropriate allergen avoidance measures and Pazeo eyedrops as needed.   Meds ordered this encounter  Medications  . azelastine (ASTELIN) 0.1 % nasal spray    Sig: Place 2 sprays into both nostrils 2 (two) times daily. Use in each nostril as directed    Dispense:  30 mL    Refill:  5  . fexofenadine (ALLEGRA) 180 MG tablet    Sig: Take 1 tablet (180 mg total) by mouth daily as needed. For allergies    Dispense:  90 tablet    Refill:  0    Physical examination: Blood pressure 136/80, pulse 76, resp. rate 14, SpO2 97 %.  General: Alert, interactive, in no acute distress. HEENT: TMs pearly gray, turbinates minimally edematous without discharge, post-pharynx unremarkable. Neck: Supple without lymphadenopathy. Lungs: Clear to auscultation without wheezing, rhonchi or  rales. CV: Normal S1, S2 without murmurs. Skin: Warm and dry, without lesions or rashes.  The following portions of the patient's history were reviewed and updated as appropriate: allergies, current medications, past family history, past medical history, past social history, past surgical history and problem list.  Allergies as of 05/28/2018      Reactions   Doxazosin Mesylate    Reaction=unknown   Doxazosin Mesylate Other (See Comments)   Other Other (See Comments)   Flomax [tamsulosin Hcl]    Itching   Jardiance [empagliflozin] Other (See Comments)   Nausea, dizziness - Severe (per pt)   Penicillins Rash, Other (See Comments)   Has patient had a PCN reaction causing immediate rash, facial/tongue/throat swelling, SOB or lightheadedness with hypotension: No Has patient had a PCN reaction causing severe rash involving mucus membranes or skin necrosis: No Has patient had a PCN reaction that required hospitalization No Has patient had a PCN reaction occurring within the last 10 years: No If all of the above answers are "NO", then may proceed with Cephalosporin use.   Sulfonamide Derivatives Rash   Trulicity [dulaglutide] Nausea And Vomiting   N&V, Dizziness      Medication List        Accurate as of 05/28/18 10:11 PM. Always use your most recent med list.          aspirin 81 MG tablet Take 81 mg by mouth every morning.   azelastine 0.1 % nasal spray Commonly known as:  ASTELIN Place 2 sprays into both nostrils 2 (two) times daily. Use in each nostril as directed   citalopram  10 MG tablet Commonly known as:  CELEXA Take 2 tablets (20 mg total) by mouth daily.   colestipol 5 g packet Commonly known as:  COLESTID Take 5 g by mouth 2 (two) times daily.   fexofenadine 180 MG tablet Commonly known as:  ALLEGRA Take 1 tablet (180 mg total) by mouth daily as needed. For allergies   finasteride 5 MG tablet Commonly known as:  PROSCAR Take 1 tablet (5 mg total) by mouth  daily.   glipiZIDE 5 MG tablet Commonly known as:  GLUCOTROL Take 1 tablet (5 mg total) by mouth daily before breakfast.   meloxicam 15 MG tablet Commonly known as:  MOBIC Take 15 mg by mouth daily.   MULTI-VITAMINS Tabs Take 1 capsule by mouth daily.   olmesartan 20 MG tablet Commonly known as:  BENICAR Take 1 tablet (20 mg total) by mouth daily.   omega-3 acid ethyl esters 1 g capsule Commonly known as:  LOVAZA Take 1 capsule (1 g total) by mouth daily.   pantoprazole 40 MG tablet Commonly known as:  PROTONIX TAKE 1 TABLET (40 MG TOTAL) BY MOUTH EVERY MORNING.   pioglitazone 15 MG tablet Commonly known as:  ACTOS TAKE 1 TABLET BY MOUTH EVERY DAY IN THE MORNING   rosuvastatin 10 MG tablet Commonly known as:  CRESTOR TAKE 1 TABLET (10 MG TOTAL) BY MOUTH DAILY.   testosterone cypionate 200 MG/ML injection Commonly known as:  DEPOTESTOSTERONE CYPIONATE INJECT 1 ML EVERY 14 DAYS   testosterone cypionate 200 MG/ML injection Commonly known as:  DEPOTESTOSTERONE CYPIONATE INJECT 1 ML EVERY 14 DAYS   Vitamin D3 1000 units Caps Take 1,000 Units by mouth daily.       Allergies  Allergen Reactions  . Doxazosin Mesylate     Reaction=unknown  . Doxazosin Mesylate Other (See Comments)  . Other Other (See Comments)  . Flomax [Tamsulosin Hcl]     Itching   . Jardiance [Empagliflozin] Other (See Comments)    Nausea, dizziness - Severe (per pt)  . Penicillins Rash and Other (See Comments)    Has patient had a PCN reaction causing immediate rash, facial/tongue/throat swelling, SOB or lightheadedness with hypotension: No Has patient had a PCN reaction causing severe rash involving mucus membranes or skin necrosis: No Has patient had a PCN reaction that required hospitalization No Has patient had a PCN reaction occurring within the last 10 years: No If all of the above answers are "NO", then may proceed with Cephalosporin use.   . Sulfonamide Derivatives Rash  .  Trulicity [Dulaglutide] Nausea And Vomiting    N&V, Dizziness    I appreciate the opportunity to take part in [facility] care. Please do not hesitate to contact me with questions.  Sincerely,   R. Edgar Frisk, MD

## 2018-05-28 NOTE — Patient Instructions (Addendum)
Allergic rhinitis Well-controlled.  Continue appropriate allergen avoidance measures, fexofenadine (Allegra) 180 mg daily as needed, and fluticasone nasal spray as needed.  Nasal saline spray (i.e., Simply Saline) or nasal saline lavage (i.e., NeilMed) is recommended as needed and prior to medicated nasal sprays.  Allergic conjunctivitis  Continue appropriate allergen avoidance measures and Pazeo eyedrops as needed.   Return in about 1 year (around 05/29/2019), or if symptoms worsen or fail to improve.

## 2018-06-07 DIAGNOSIS — M25569 Pain in unspecified knee: Secondary | ICD-10-CM | POA: Insufficient documentation

## 2018-06-07 DIAGNOSIS — M1711 Unilateral primary osteoarthritis, right knee: Secondary | ICD-10-CM | POA: Diagnosis not present

## 2018-06-08 DIAGNOSIS — D4989 Neoplasm of unspecified behavior of other specified sites: Secondary | ICD-10-CM | POA: Diagnosis not present

## 2018-06-09 ENCOUNTER — Other Ambulatory Visit: Payer: Self-pay | Admitting: Family Medicine

## 2018-06-11 DIAGNOSIS — M1711 Unilateral primary osteoarthritis, right knee: Secondary | ICD-10-CM | POA: Diagnosis not present

## 2018-06-15 ENCOUNTER — Ambulatory Visit: Payer: PRIVATE HEALTH INSURANCE | Admitting: Family Medicine

## 2018-06-15 ENCOUNTER — Ambulatory Visit: Payer: PRIVATE HEALTH INSURANCE

## 2018-06-18 ENCOUNTER — Ambulatory Visit (INDEPENDENT_AMBULATORY_CARE_PROVIDER_SITE_OTHER): Payer: PRIVATE HEALTH INSURANCE

## 2018-06-18 DIAGNOSIS — M25562 Pain in left knee: Secondary | ICD-10-CM | POA: Diagnosis not present

## 2018-06-18 DIAGNOSIS — R7989 Other specified abnormal findings of blood chemistry: Secondary | ICD-10-CM

## 2018-06-18 DIAGNOSIS — E291 Testicular hypofunction: Secondary | ICD-10-CM | POA: Diagnosis not present

## 2018-06-18 DIAGNOSIS — M25561 Pain in right knee: Secondary | ICD-10-CM | POA: Diagnosis not present

## 2018-06-18 NOTE — Progress Notes (Signed)
Patient was in office for testosterone injection.Patient received injection in his right ventrogluteal. Patient tolerated well.

## 2018-06-22 DIAGNOSIS — H02132 Senile ectropion of right lower eyelid: Secondary | ICD-10-CM | POA: Diagnosis not present

## 2018-07-10 DIAGNOSIS — E119 Type 2 diabetes mellitus without complications: Secondary | ICD-10-CM | POA: Diagnosis not present

## 2018-07-11 ENCOUNTER — Other Ambulatory Visit: Payer: Self-pay | Admitting: Family Medicine

## 2018-07-13 ENCOUNTER — Ambulatory Visit (INDEPENDENT_AMBULATORY_CARE_PROVIDER_SITE_OTHER): Payer: Medicare HMO

## 2018-07-13 DIAGNOSIS — E291 Testicular hypofunction: Secondary | ICD-10-CM

## 2018-07-13 DIAGNOSIS — R7989 Other specified abnormal findings of blood chemistry: Secondary | ICD-10-CM

## 2018-07-13 NOTE — Progress Notes (Signed)
Patient came in today for his biweekly testosterone injection. Injection was given in the Right Ventrogluteal muscle.He tolerated well.

## 2018-08-06 ENCOUNTER — Other Ambulatory Visit: Payer: Self-pay | Admitting: Family Medicine

## 2018-08-06 DIAGNOSIS — H0015 Chalazion left lower eyelid: Secondary | ICD-10-CM | POA: Diagnosis not present

## 2018-08-06 NOTE — Telephone Encounter (Signed)
Requesting refill      LOV:  05/01/18  LRF:  05/28/18

## 2018-08-07 ENCOUNTER — Ambulatory Visit (INDEPENDENT_AMBULATORY_CARE_PROVIDER_SITE_OTHER): Payer: Medicare HMO | Admitting: Family Medicine

## 2018-08-07 DIAGNOSIS — R7989 Other specified abnormal findings of blood chemistry: Secondary | ICD-10-CM | POA: Diagnosis not present

## 2018-08-07 DIAGNOSIS — E291 Testicular hypofunction: Secondary | ICD-10-CM

## 2018-08-23 ENCOUNTER — Ambulatory Visit (INDEPENDENT_AMBULATORY_CARE_PROVIDER_SITE_OTHER): Payer: Medicare HMO | Admitting: *Deleted

## 2018-08-23 DIAGNOSIS — E291 Testicular hypofunction: Secondary | ICD-10-CM

## 2018-08-23 DIAGNOSIS — R7989 Other specified abnormal findings of blood chemistry: Secondary | ICD-10-CM | POA: Diagnosis not present

## 2018-09-04 DIAGNOSIS — M1711 Unilateral primary osteoarthritis, right knee: Secondary | ICD-10-CM | POA: Diagnosis not present

## 2018-09-13 ENCOUNTER — Other Ambulatory Visit: Payer: Self-pay

## 2018-09-13 ENCOUNTER — Ambulatory Visit (INDEPENDENT_AMBULATORY_CARE_PROVIDER_SITE_OTHER): Payer: Medicare HMO | Admitting: *Deleted

## 2018-09-13 DIAGNOSIS — R7989 Other specified abnormal findings of blood chemistry: Secondary | ICD-10-CM

## 2018-09-13 DIAGNOSIS — E291 Testicular hypofunction: Secondary | ICD-10-CM

## 2018-09-17 ENCOUNTER — Other Ambulatory Visit: Payer: Self-pay | Admitting: Allergy and Immunology

## 2018-10-04 ENCOUNTER — Other Ambulatory Visit: Payer: Self-pay | Admitting: Family Medicine

## 2018-10-08 ENCOUNTER — Ambulatory Visit: Payer: Medicare HMO

## 2018-10-09 ENCOUNTER — Ambulatory Visit (INDEPENDENT_AMBULATORY_CARE_PROVIDER_SITE_OTHER): Payer: Medicare HMO | Admitting: Family Medicine

## 2018-10-09 DIAGNOSIS — E291 Testicular hypofunction: Secondary | ICD-10-CM

## 2018-10-09 DIAGNOSIS — R7989 Other specified abnormal findings of blood chemistry: Secondary | ICD-10-CM

## 2018-10-15 ENCOUNTER — Encounter: Payer: Self-pay | Admitting: Family Medicine

## 2018-10-15 ENCOUNTER — Ambulatory Visit (INDEPENDENT_AMBULATORY_CARE_PROVIDER_SITE_OTHER): Payer: Medicare HMO | Admitting: Family Medicine

## 2018-10-15 ENCOUNTER — Other Ambulatory Visit: Payer: Self-pay

## 2018-10-15 VITALS — BP 148/82 | HR 88 | Temp 98.8°F | Resp 16 | Ht 71.0 in | Wt 256.0 lb

## 2018-10-15 DIAGNOSIS — J0100 Acute maxillary sinusitis, unspecified: Secondary | ICD-10-CM

## 2018-10-15 DIAGNOSIS — J018 Other acute sinusitis: Secondary | ICD-10-CM

## 2018-10-15 MED ORDER — FLUTICASONE PROPIONATE 50 MCG/ACT NA SUSP: 2.0000 | Freq: Every day | NASAL | 6 refills | Status: DC

## 2018-10-15 MED ORDER — MAGIC MOUTHWASH: 5.0000 mL | Freq: Three times a day (TID) | ORAL | 0 refills | Status: DC | PRN

## 2018-10-15 MED ORDER — METHYLPREDNISOLONE ACETATE 40 MG/ML IJ SUSP
40.0000 mg | Freq: Once | INTRAMUSCULAR | Status: AC
  Administered 2018-10-15: 40 mg via INTRAMUSCULAR

## 2018-10-15 MED ORDER — CEFDINIR 300 MG PO CAPS: 300.0000 mg | ORAL_CAPSULE | Freq: Two times a day (BID) | ORAL | 0 refills | Status: DC

## 2018-10-15 NOTE — Progress Notes (Signed)
   Subjective:    Patient ID: Cameron Daniel, male    DOB: Jul 23, 1946, 73 y.o.   MRN: [id number]  Patient presents for Illness (x3 days- nasal drainage, eye itching, hoarse, productive cough with white mucus)   Nasal drainage sinus pressure sore throat, eyes buring, hoarse voice for past 3 days, has been gradually worse. Mild cough.  No fever no chills. Also feels like his tongue is burning   Taking allegra, has history of sinusitis and allergy problems. States CBG have been good his last A1c was 7.1%.  Has had steroid shots in the past which have helped significantly when he gets his symptoms.   Review Of Systems:  GEN- denies fatigue, fever, weight loss,weakness, recent illness HEENT- denies eye drainage, change in vision,+ nasal discharge, CVS- denies chest pain, palpitations RESP- denies SOB, +cough, wheeze ABD- denies N/V, change in stools, abd pain GU- denies dysuria, hematuria, dribbling, incontinence MSK- denies joint pain, muscle aches, injury Neuro- denies headache, dizziness, syncope, seizure activity       Objective:    BP (!) 148/82   Pulse 88   Temp 98.8 F (37.1 C) (Oral)   Resp 16   Ht 5\' 11"  (1.803 m)   Wt 256 lb (116.1 kg)   SpO2 98%   BMI 35.70 kg/m  GEN- NAD, alert and oriented x3 HEENT- PERRL, EOMI, non injected sclera, pink conjunctiva, MMM, oropharynx mild injection, TM clear bilat no effusion,  + left maxillary sinus tenderness, inflammed turbinates,  Nasal drainage  Neck- Supple, shotty ant LAD CVS- RRR, no murmur RESP-CTAB EXT- No edema Pulses- Radial 2+         Assessment & Plan:      Problem List Items Addressed This Visit    None    Visit Diagnoses    Acute maxillary sinusitis, recurrence not specified    -  Primary   Depo medrol 40mg  IM given, omnicef, magicmouth wash for tongue burning sensation, flonase, and allegra   Relevant Medications   fluticasone (FLONASE) 50 MCG/ACT nasal spray   cefdinir (OMNICEF) 300 MG capsule    magic mouthwash SOLN   methylPREDNISolone acetate (DEPO-MEDROL) injection 40 mg (Completed)      Note: This dictation was prepared with Dragon dictation along with smaller phrase technology. Any transcriptional errors that result from this process are unintentional.

## 2018-10-15 NOTE — Patient Instructions (Signed)
Magic mouthwash Flonase, allergra, antibiotics Steroid shot given F/U if not improved

## 2018-10-16 ENCOUNTER — Telehealth: Payer: Self-pay | Admitting: *Deleted

## 2018-10-16 MED ORDER — AZITHROMYCIN 250 MG PO TABS: ORAL_TABLET | ORAL | 0 refills | Status: DC

## 2018-10-16 NOTE — Telephone Encounter (Signed)
Received call from patient.   Reports that he has read safety information on Omnicef and is concerned about GI upset and that it is a close relative to PCN.   Reports that he did not pick up and would prefer a Z Pack.   MD please advise.

## 2018-10-16 NOTE — Telephone Encounter (Signed)
Okay to send in zpak

## 2018-10-16 NOTE — Telephone Encounter (Signed)
Call placed to patient and patient made aware.   Prescription sent to pharmacy.  

## 2018-10-17 DIAGNOSIS — R69 Illness, unspecified: Secondary | ICD-10-CM | POA: Diagnosis not present

## 2018-11-01 ENCOUNTER — Ambulatory Visit (INDEPENDENT_AMBULATORY_CARE_PROVIDER_SITE_OTHER): Payer: Medicare HMO | Admitting: Family Medicine

## 2018-11-01 DIAGNOSIS — R7989 Other specified abnormal findings of blood chemistry: Secondary | ICD-10-CM | POA: Diagnosis not present

## 2018-11-01 DIAGNOSIS — E291 Testicular hypofunction: Secondary | ICD-10-CM | POA: Diagnosis not present

## 2018-11-07 ENCOUNTER — Encounter: Payer: Self-pay | Admitting: Family Medicine

## 2018-11-07 ENCOUNTER — Other Ambulatory Visit: Payer: Self-pay

## 2018-11-07 ENCOUNTER — Ambulatory Visit (INDEPENDENT_AMBULATORY_CARE_PROVIDER_SITE_OTHER): Payer: Medicare HMO | Admitting: Family Medicine

## 2018-11-07 VITALS — BP 130/82 | HR 82 | Temp 98.4°F | Resp 16 | Ht 71.0 in | Wt 259.0 lb

## 2018-11-07 DIAGNOSIS — M1711 Unilateral primary osteoarthritis, right knee: Secondary | ICD-10-CM | POA: Diagnosis not present

## 2018-11-07 DIAGNOSIS — M25561 Pain in right knee: Secondary | ICD-10-CM | POA: Diagnosis not present

## 2018-11-07 DIAGNOSIS — J0100 Acute maxillary sinusitis, unspecified: Secondary | ICD-10-CM

## 2018-11-07 DIAGNOSIS — J018 Other acute sinusitis: Secondary | ICD-10-CM | POA: Diagnosis not present

## 2018-11-07 DIAGNOSIS — J3089 Other allergic rhinitis: Secondary | ICD-10-CM

## 2018-11-07 MED ORDER — METHYLPREDNISOLONE ACETATE 40 MG/ML IJ SUSP
40.0000 mg | Freq: Once | INTRAMUSCULAR | Status: AC
  Administered 2018-11-07: 40 mg via INTRAMUSCULAR

## 2018-11-07 MED ORDER — PREDNISONE 10 MG PO TABS: ORAL_TABLET | ORAL | 0 refills | Status: DC

## 2018-11-07 MED ORDER — AZITHROMYCIN 250 MG PO TABS: ORAL_TABLET | ORAL | 0 refills | Status: DC

## 2018-11-07 NOTE — Patient Instructions (Signed)
F/u IF NOT IMPROVED

## 2018-11-07 NOTE — Progress Notes (Signed)
   Subjective:    Patient ID: Cameron Daniel, male    DOB: August 14, 1946, 73 y.o.   MRN: [id number]  Patient presents for Illness (x2 weeks- sinus pressure, post nasal drip, productive cough, malaise, no fever)   Sore throat, chest congestion with cough and mild production, More of a tickle before he coughs, No fever. Sinus pressure and pain still present. Some ear pain.  Has underlying allergies  Used the zpak and flonase.  Has episodes where he gets beads of sweat  Has severe allergies was on shots in the past     Note was seen in  Feb, had more sinus symptoms, improved for a few days and then came back      Review Of Systems:  GEN- denies fatigue, fever, weight loss,weakness, recent illness HEENT- denies eye drainage, change in vision,+ nasal discharge, CVS- denies chest pain, palpitations RESP- denies SOB, +cough, wheeze ABD- denies N/V, change in stools, abd pain GU- denies dysuria, hematuria, dribbling, incontinence MSK- denies joint pain, muscle aches, injury Neuro- denies headache, dizziness, syncope, seizure activity       Objective:    BP 130/82   Pulse 82   Temp 98.4 F (36.9 C) (Oral)   Resp 16   Ht 5\' 11"  (1.803 m)   Wt 259 lb (117.5 kg)   SpO2 98%   BMI 36.12 kg/m  GEN- NAD, alert and oriented x3 HEENT- PERRL, EOMI, non injected sclera, pink conjunctiva, MMM, oropharynx mild injection, TM clear bilat no effusion,  + maxillary sinus tenderness, inflammed turbinates,  Nasal drainage  Neck- Supple, no LAD CVS- RRR, no murmur RESP-CTAB EXT- No edema Pulses- Radial 2+         Assessment & Plan:      Problem List Items Addressed This Visit      Unprioritized   Allergic rhinitis - Primary   Relevant Medications   methylPREDNISolone acetate (DEPO-MEDROL) injection 40 mg (Completed)    Other Visit Diagnoses    Acute maxillary sinusitis, recurrence not specified       repeat round of azithromycin, he has documented sinusitis, allergy issues that  are chronic    Relevant Medications   azithromycin (ZITHROMAX) 250 MG tablet   predniSONE (DELTASONE) 10 MG tablet   methylPREDNISolone acetate (DEPO-MEDROL) injection 40 mg (Completed)      Note: This dictation was prepared with Dragon dictation along with smaller phrase technology. Any transcriptional errors that result from this process are unintentional.

## 2018-11-14 DIAGNOSIS — M25561 Pain in right knee: Secondary | ICD-10-CM | POA: Diagnosis not present

## 2018-11-14 DIAGNOSIS — M545 Low back pain: Secondary | ICD-10-CM | POA: Diagnosis not present

## 2018-11-14 DIAGNOSIS — M1711 Unilateral primary osteoarthritis, right knee: Secondary | ICD-10-CM | POA: Diagnosis not present

## 2018-11-14 DIAGNOSIS — M5136 Other intervertebral disc degeneration, lumbar region: Secondary | ICD-10-CM | POA: Diagnosis not present

## 2018-11-14 IMAGING — CR DG FOOT COMPLETE 3+V*L*
3 series · 3 of 3 positions shown · non-contrast
Comparison: None in PACs

CLINICAL DATA: Mid tarsal left foot pain for the past 2-3 months
with no known injury. No soft tissue swelling.

EXAM:
LEFT FOOT - COMPLETE 3+ VIEW

[t foot ap left]
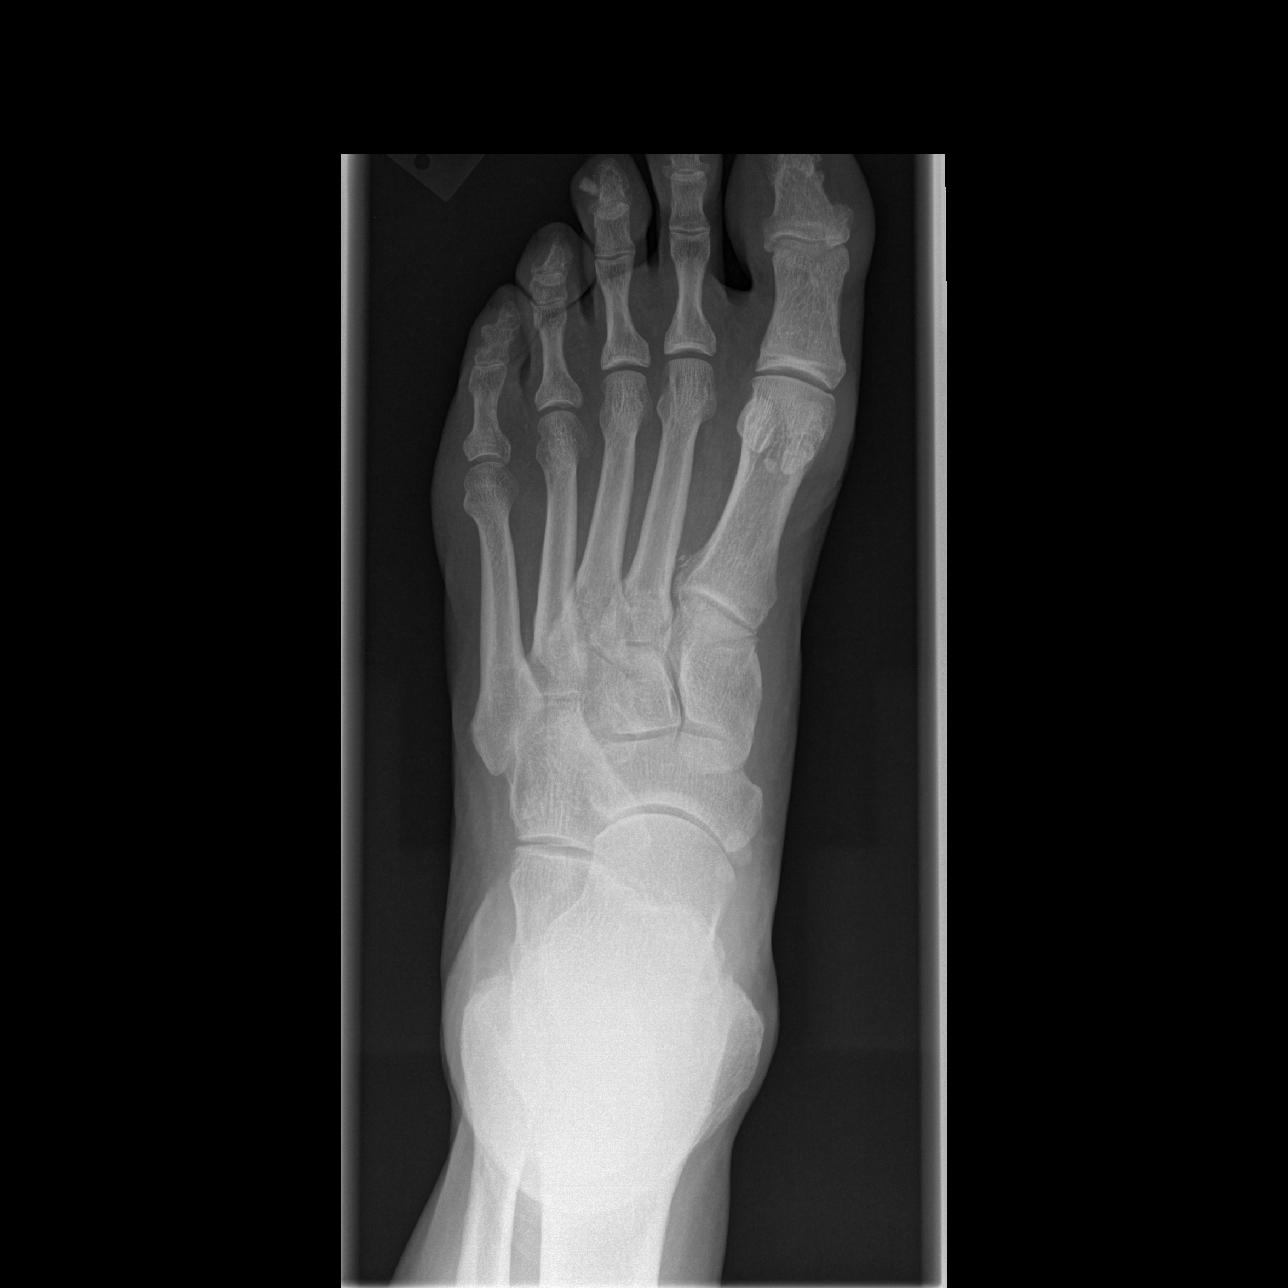

[t foot oblique left]
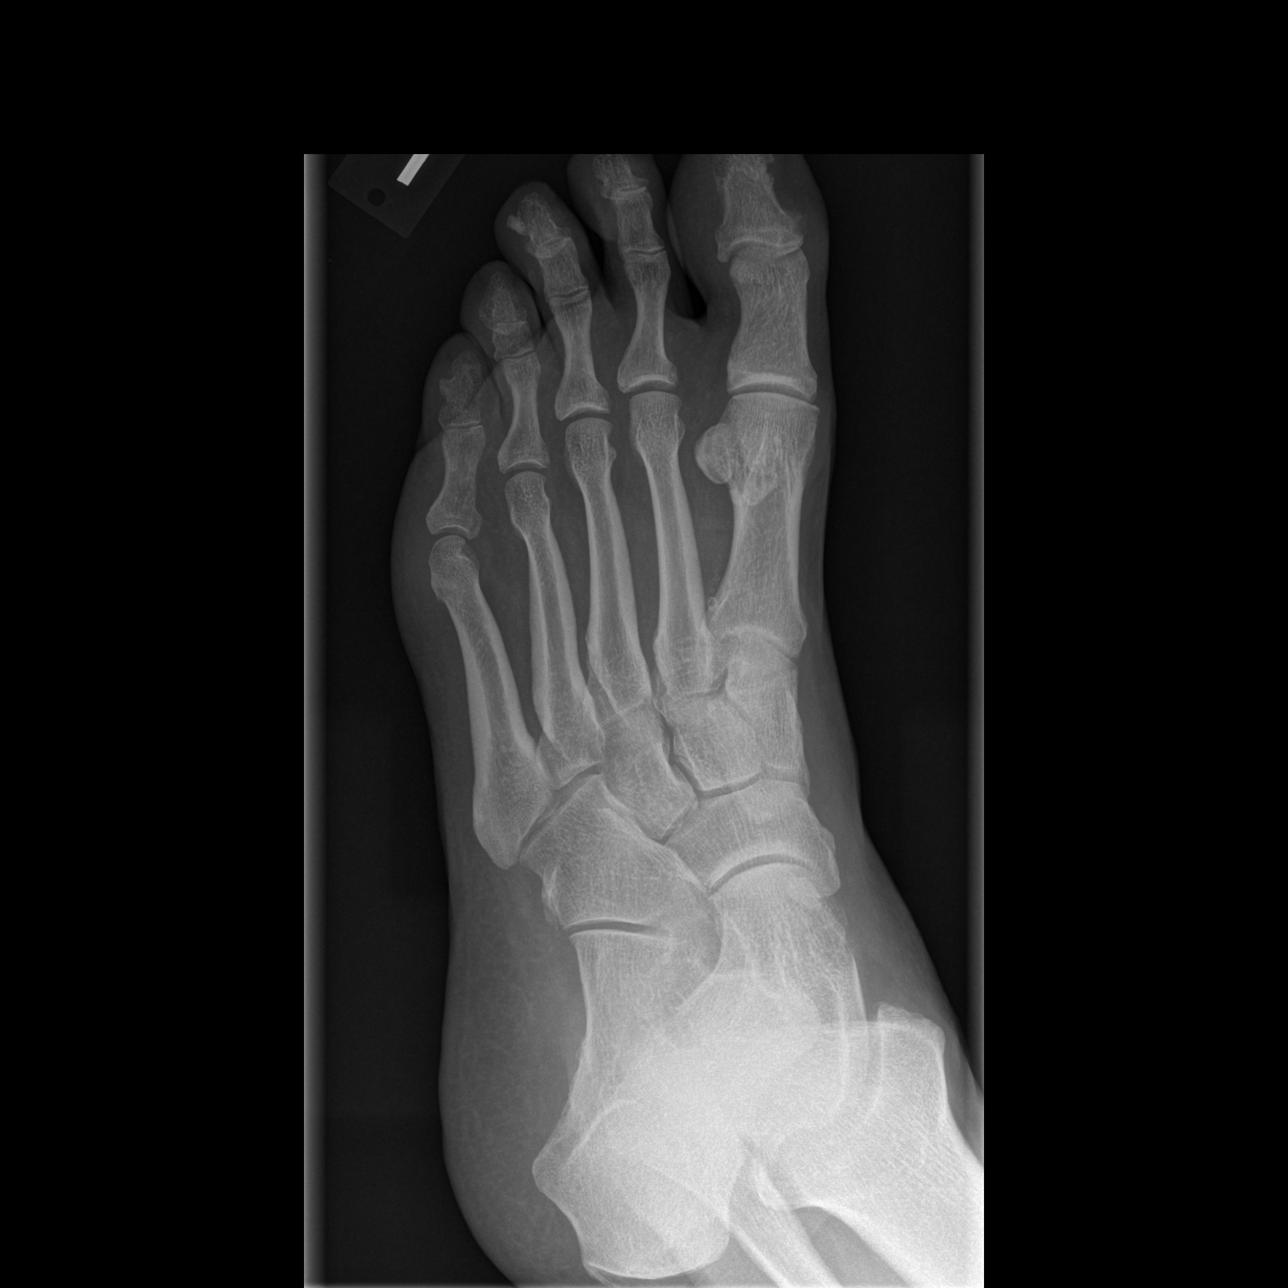

[t foot lat left]
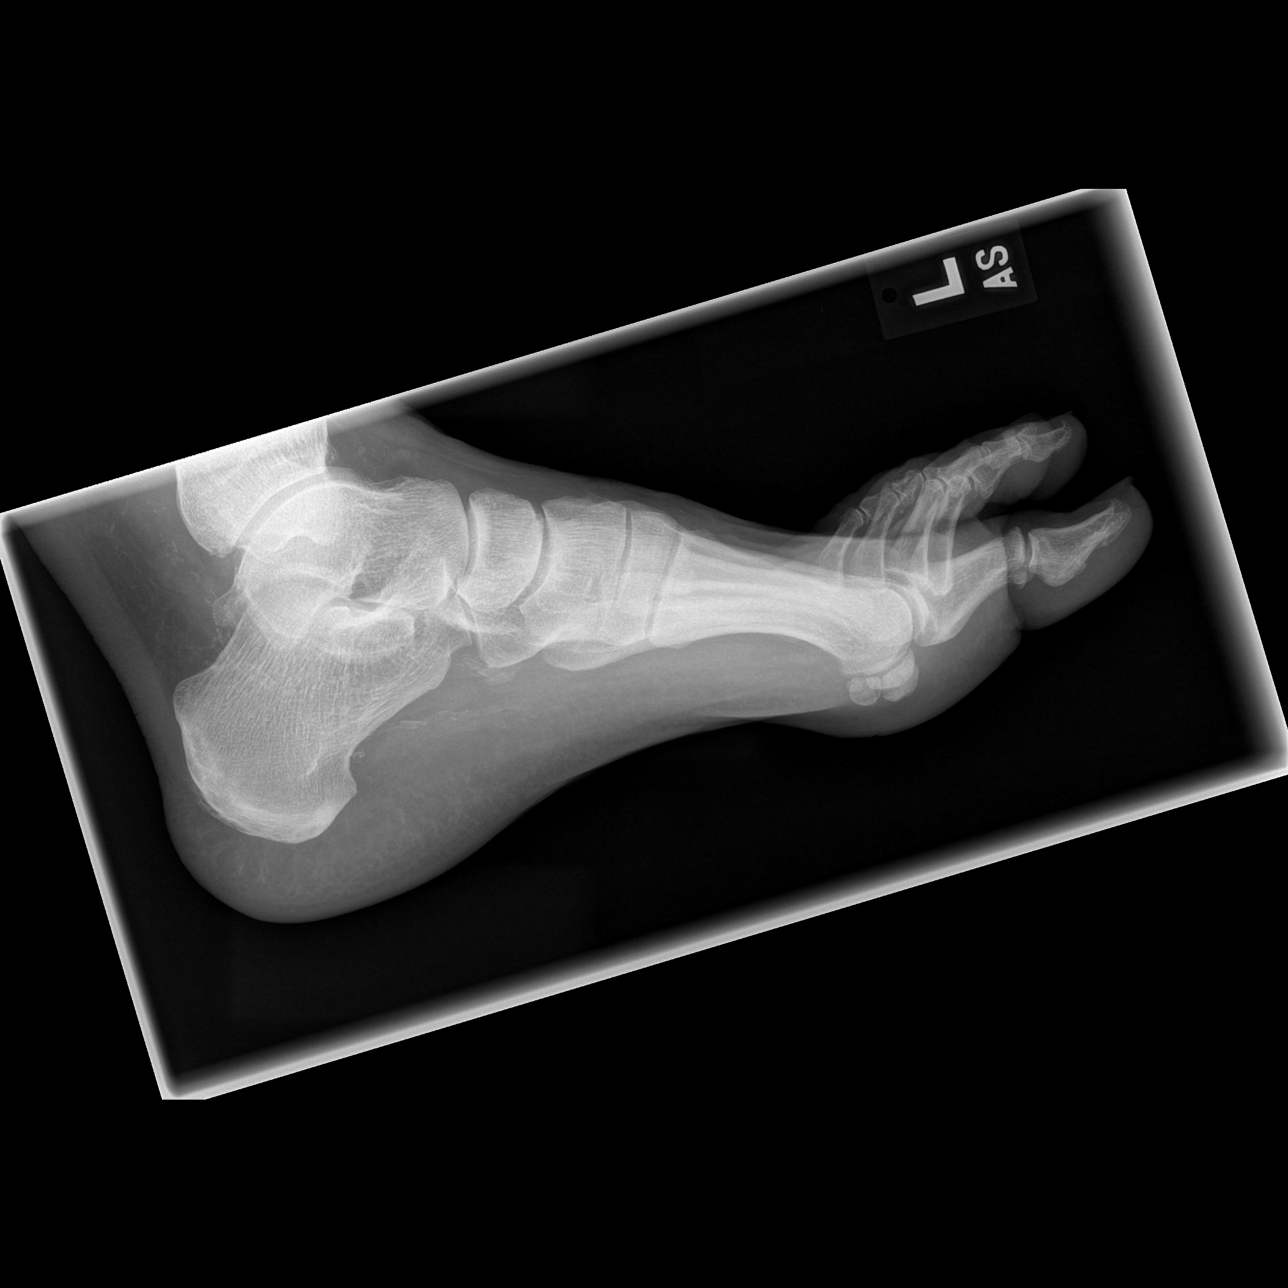

[3 of 3 positions shown; findings below may reference images not displayed]

FINDINGS: The bones are subjectively adequately mineralized. There is
radiodensity over the lateral aspect of the tip of the third toe
which likely lies within the nail but could reflect a bony
excrescence. The interphalangeal and metatarsophalangeal joint
spaces are reasonably well maintained for age. The tarsometatarsal
and intertarsal joints appear normal. No acute bony abnormality of
the tarsals or metatarsals is observed. There are arterial
calcifications in the foot.
IMPRESSION: There is no acute bony abnormality of the left foot. Mild
osteoarthritic changes are present.

## 2018-11-20 ENCOUNTER — Other Ambulatory Visit: Payer: Self-pay

## 2018-11-20 ENCOUNTER — Ambulatory Visit (INDEPENDENT_AMBULATORY_CARE_PROVIDER_SITE_OTHER): Payer: Medicare HMO

## 2018-11-20 DIAGNOSIS — R7989 Other specified abnormal findings of blood chemistry: Secondary | ICD-10-CM

## 2018-11-20 DIAGNOSIS — E291 Testicular hypofunction: Secondary | ICD-10-CM

## 2018-11-20 NOTE — Progress Notes (Signed)
Patient in today for testosterone injection. Patient received testosterone in the right ventrogluteal muscle. He tolerated well.

## 2018-11-21 DIAGNOSIS — M1711 Unilateral primary osteoarthritis, right knee: Secondary | ICD-10-CM | POA: Diagnosis not present

## 2018-11-26 ENCOUNTER — Other Ambulatory Visit: Payer: Self-pay | Admitting: *Deleted

## 2018-11-26 MED ORDER — ROSUVASTATIN CALCIUM 10 MG PO TABS: 10.0000 mg | ORAL_TABLET | Freq: Every day | ORAL | 3 refills | Status: DC

## 2018-12-13 ENCOUNTER — Other Ambulatory Visit: Payer: Self-pay

## 2018-12-13 ENCOUNTER — Ambulatory Visit (INDEPENDENT_AMBULATORY_CARE_PROVIDER_SITE_OTHER): Payer: Medicare HMO | Admitting: *Deleted

## 2018-12-13 DIAGNOSIS — E291 Testicular hypofunction: Secondary | ICD-10-CM

## 2018-12-13 DIAGNOSIS — R7989 Other specified abnormal findings of blood chemistry: Secondary | ICD-10-CM | POA: Diagnosis not present

## 2018-12-13 NOTE — Progress Notes (Signed)
Patient seen in office for testosterone injection.   Tolerated IM administration well in LV.

## 2019-01-02 ENCOUNTER — Ambulatory Visit (INDEPENDENT_AMBULATORY_CARE_PROVIDER_SITE_OTHER): Payer: Medicare HMO | Admitting: Family Medicine

## 2019-01-02 ENCOUNTER — Other Ambulatory Visit: Payer: Self-pay

## 2019-01-02 DIAGNOSIS — E291 Testicular hypofunction: Secondary | ICD-10-CM

## 2019-01-02 DIAGNOSIS — R7989 Other specified abnormal findings of blood chemistry: Secondary | ICD-10-CM | POA: Diagnosis not present

## 2019-01-03 DIAGNOSIS — M1711 Unilateral primary osteoarthritis, right knee: Secondary | ICD-10-CM | POA: Diagnosis not present

## 2019-01-07 ENCOUNTER — Other Ambulatory Visit: Payer: Self-pay | Admitting: Family Medicine

## 2019-01-17 DIAGNOSIS — M5136 Other intervertebral disc degeneration, lumbar region: Secondary | ICD-10-CM | POA: Diagnosis not present

## 2019-01-17 DIAGNOSIS — I700 Atherosclerosis of aorta: Secondary | ICD-10-CM | POA: Diagnosis not present

## 2019-01-23 ENCOUNTER — Other Ambulatory Visit: Payer: Self-pay

## 2019-01-23 ENCOUNTER — Ambulatory Visit (INDEPENDENT_AMBULATORY_CARE_PROVIDER_SITE_OTHER): Payer: Medicare HMO | Admitting: Family Medicine

## 2019-01-23 DIAGNOSIS — E291 Testicular hypofunction: Secondary | ICD-10-CM | POA: Diagnosis not present

## 2019-01-23 DIAGNOSIS — R7989 Other specified abnormal findings of blood chemistry: Secondary | ICD-10-CM

## 2019-01-25 ENCOUNTER — Other Ambulatory Visit: Payer: Self-pay | Admitting: Family Medicine

## 2019-01-25 MED ORDER — TESTOSTERONE CYPIONATE 200 MG/ML IM SOLN: INTRAMUSCULAR | 3 refills | Status: DC

## 2019-01-25 NOTE — Telephone Encounter (Signed)
Requesting refill    Testosterone  LOV:  11/07/18  LRF:   12/05/17

## 2019-01-29 DIAGNOSIS — M545 Low back pain: Secondary | ICD-10-CM | POA: Diagnosis not present

## 2019-01-31 ENCOUNTER — Telehealth: Payer: Self-pay | Admitting: Family Medicine

## 2019-01-31 NOTE — Telephone Encounter (Signed)
Pt needs to be seen for this, I would not recommend flagyl without having stool studies showing infection or C diff Prednisone itself should not cause C diff He can do BRAT diet, take immodium if no pain or fever

## 2019-01-31 NOTE — Telephone Encounter (Signed)
Pt called and states that he has just finished prednisone given to him by Dr. Nelva Bush. Since starting that he has had watery diarrhea and increased amount of gas. He has not had any fever or abd pain. He has taken align x 8 days and pepto bismol. He was requesting flagyl (dont know why) but he wanted to know if we could call him in something for it?? Please advise.

## 2019-02-01 NOTE — Telephone Encounter (Signed)
Left message return call

## 2019-02-04 NOTE — Telephone Encounter (Signed)
Patient returned call and made aware.   States that he has been trying [employer] with no relief.   Requested appointment with PCP. Appointment scheduled for 6/11, though earlier appointment with fellow provider offered.

## 2019-02-07 ENCOUNTER — Encounter: Payer: Self-pay | Admitting: Family Medicine

## 2019-02-07 ENCOUNTER — Ambulatory Visit (INDEPENDENT_AMBULATORY_CARE_PROVIDER_SITE_OTHER): Payer: Medicare HMO | Admitting: Family Medicine

## 2019-02-07 ENCOUNTER — Other Ambulatory Visit: Payer: Self-pay

## 2019-02-07 VITALS — BP 132/60 | HR 78 | Temp 98.5°F | Resp 18 | Ht 71.0 in | Wt 259.0 lb

## 2019-02-07 DIAGNOSIS — E291 Testicular hypofunction: Secondary | ICD-10-CM

## 2019-02-07 DIAGNOSIS — R208 Other disturbances of skin sensation: Secondary | ICD-10-CM

## 2019-02-07 DIAGNOSIS — K580 Irritable bowel syndrome with diarrhea: Secondary | ICD-10-CM

## 2019-02-07 MED ORDER — NYSTATIN 100000 UNIT/ML MT SUSP: 5.0000 mL | Freq: Four times a day (QID) | OROMUCOSAL | 0 refills | Status: DC

## 2019-02-07 MED ORDER — DIPHENOXYLATE-ATROPINE 2.5-0.025 MG PO TABS: 2.0000 | ORAL_TABLET | Freq: Four times a day (QID) | ORAL | 0 refills | Status: DC | PRN

## 2019-02-07 NOTE — Progress Notes (Signed)
Subjective:    Patient ID: Cameron Daniel, male    DOB: Jan 02, 1946, 73 y.o.   MRN: [id number]  Patient presents today with numerous concerns.  First and foremost he reports frequent explosive diarrhea now for 3 weeks.  He states that he will suddenly have the urge to defecate and will be extreme watery diarrhea.  He has been taking Pepto-Bismol 1-2 times a day and this is helped control the diarrhea somewhat.  He denies any blood in his stool.  He denies any fever.  He denies any nausea or vomiting.  He does report increased gas and bloating.  He is tried align over-the-counter with no benefit.  He also complains of a headache above his left eye.  The headache has been off and on now for 2 months.  He denies any vision changes.  He denies any sinus pain.  Although this the exact location where the headache is located.  He does report constant rhinorrhea and postnasal drip due to allergies.  He reports a burning sensation in his mouth.  He states it feels like that he is eating spices all the time despite not eating spices.  There are a few scattered white patches on his cheeks and on his tongue suggesting possible yeast.  He is on a nasal steroid spray but is not doing any inhaled steroids although he did recently take prednisone for hip pain under the care of an orthopedist.  He is due for fasting lab work to monitor his diabetes is no want to check lab work since August of last year.  He is requesting an EKG test.  He is requesting prostate cancer screening. Past Medical History:  Diagnosis Date  . Allergic rhinitis, cause unspecified   . Allergy   . Anxiety   . Arthritis   . Cancer ([place])   . Cataract   . Coronary atherosclerosis of unspecified type of vessel, native or graft    1985 , arthroplasty x 1   . Depression    PTSD   . Diverticulosis of colon (without mention of hemorrhage)   . Esophageal reflux   . Esophageal stricture   . Flushing   . Hemorrhoids   . Irritable bowel syndrome    . Myocardial infarction ([place])   . Nocturia   . Obesity, unspecified   . Other acquired absence of organ   . Other and unspecified hyperlipidemia   . Other malaise and fatigue   . Personal history of colonic polyps   . Psychosexual dysfunction with inhibited sexual excitement   . Type II or unspecified type diabetes mellitus without mention of complication, not stated as uncontrolled   . Unspecified essential hypertension    Past Surgical History:  Procedure Laterality Date  . ADENOIDECTOMY    . CARPAL TUNNEL RELEASE     left   . CHOLECYSTECTOMY    . COLON RESECTION     18 inches  . COLON SURGERY  2013  . CORONARY ANGIOPLASTY     1985   . ELBOW BURSA SURGERY    . EYE SURGERY    . POLYPECTOMY    . SHOULDER SURGERY     Rt. Shoulder  . TONSILLECTOMY     Current Outpatient Medications on File Prior to Visit  Medication Sig Dispense Refill  . aspirin 81 MG tablet Take 81 mg by mouth every morning.     . Cholecalciferol (VITAMIN D3) 1000 units CAPS Take 1,000 Units by mouth daily.    Marland Kitchen  citalopram (CELEXA) 10 MG tablet Take 2 tablets (20 mg total) by mouth daily. 180 tablet 3  . colestipol (COLESTID) 5 g packet Take 5 g by mouth 2 (two) times daily.    . fexofenadine (ALLEGRA) 180 MG tablet Take 1 tablet (180 mg total) by mouth daily as needed. For allergies 90 tablet 0  . finasteride (PROSCAR) 5 MG tablet Take 1 tablet (5 mg total) by mouth daily. 90 tablet 3  . fluticasone (FLONASE) 50 MCG/ACT nasal spray Place 2 sprays into both nostrils daily. 16 g 6  . glipiZIDE (GLUCOTROL) 5 MG tablet TAKE 1 TABLET (5 MG TOTAL) BY MOUTH DAILY BEFORE BREAKFAST. 90 tablet 1  . Multiple Vitamin (MULTI-VITAMINS) TABS Take 1 capsule by mouth daily.    Marland Kitchen olmesartan (BENICAR) 20 MG tablet Take 1 tablet (20 mg total) by mouth daily. 90 tablet 3  . omega-3 acid ethyl esters (LOVAZA) 1 g capsule Take 1 capsule (1 g total) by mouth daily. 90 capsule 2  . pantoprazole (PROTONIX) 40 MG tablet TAKE 1  TABLET (40 MG TOTAL) BY MOUTH EVERY MORNING. 90 tablet 3  . pioglitazone (ACTOS) 15 MG tablet TAKE 1 TABLET BY MOUTH EVERY DAY IN THE MORNING 90 tablet 2  . rosuvastatin (CRESTOR) 10 MG tablet Take 1 tablet (10 mg total) by mouth daily. 90 tablet 3  . testosterone cypionate (DEPOTESTOSTERONE CYPIONATE) 200 MG/ML injection INJECT 1 ML EVERY 14 DAYS 6 mL 3   Current Facility-Administered Medications on File Prior to Visit  Medication Dose Route Frequency Provider Last Rate Last Dose  . 0.9 %  sodium chloride infusion  500 mL Intravenous Continuous Irene Shipper, MD      . testosterone cypionate (DEPOTESTOSTERONE CYPIONATE) injection 200 mg  200 mg Intramuscular Q14 Days [name], MD   200 mg at 01/23/19 1529   Allergies  Allergen Reactions  . Doxazosin Mesylate     Reaction=unknown  . Doxazosin Mesylate Other (See Comments)  . Other Other (See Comments)  . Flomax [Tamsulosin Hcl]     Itching   . Jardiance [Empagliflozin] Other (See Comments)    Nausea, dizziness - Severe (per pt)  . Penicillins Rash and Other (See Comments)    Has patient had a PCN reaction causing immediate rash, facial/tongue/throat swelling, SOB or lightheadedness with hypotension: No Has patient had a PCN reaction causing severe rash involving mucus membranes or skin necrosis: No Has patient had a PCN reaction that required hospitalization No Has patient had a PCN reaction occurring within the last 10 years: No If all of the above answers are "NO", then may proceed with Cephalosporin use.   . Sulfonamide Derivatives Rash  . Trulicity [Dulaglutide] Nausea And Vomiting    N&V, Dizziness   Social History   Socioeconomic History  . Marital status: Married    Spouse name: Not on file  . Number of children: 1  . Years of education: Not on file  . Highest education level: Not on file  Occupational History  . 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 Financial resource strain: Not on file  . Food insecurity     Worry: Not on file    Inability: Not on file  . Transportation needs    Medical: Not on file    Non-medical: Not on file  Tobacco Use  . Smoking status: Former Smoker    Quit date: 08/30/1983    Years since quitting: 35.4  . Smokeless tobacco: Former Systems developer  . Tobacco comment:  Quit 30 years ago.  Substance and Sexual Activity  . Alcohol use: Yes    Comment: rare  . Drug use: No  . Sexual activity: Yes  Lifestyle  . Physical activity    Days per week: Not on file    Minutes per session: Not on file  . Stress: Not on file  Relationships  . Social Herbalist on phone: Not on file    Gets together: Not on file    Attends religious service: Not on file    Active member of club or organization: Not on file    Attends meetings of clubs or organizations: Not on file    Relationship status: Not on file  . Intimate partner violence    Fear of current or ex partner: Not on file    Emotionally abused: Not on file    Physically abused: Not on file    Forced sexual activity: Not on file  Other Topics Concern  . Not on file  Social History Narrative  . Not on file      Review of Systems  All other systems reviewed and are negative.      Objective:   Physical Exam  Neck: Neck supple. No thyromegaly present.  Cardiovascular: Normal rate, regular rhythm, normal heart sounds and intact distal pulses. Exam reveals no gallop and no friction rub.  No murmur heard. Pulmonary/Chest: Effort normal and breath sounds normal. No respiratory distress. He has no wheezes. He has no rales. He exhibits no tenderness.  Abdominal: Soft. Bowel sounds are normal. He exhibits no distension and no mass. There is no abdominal tenderness. There is no rebound and no guarding. Hernia confirmed negative in the right inguinal area and confirmed negative in the left inguinal area.  Vitals reviewed.         Assessment & Plan:  I believe his diarrhea is most likely IBS.  The patient also  complains of feeling nervous and anxious more recently.  I have recommended trying symptomatic treatment with Lomotil 2 tablets every 6 hours as needed diarrhea.  I recommended that he continue the align but also add FiberCon or Metamucil to increase the fiber in his diet.  Reassess the patient in 1 week.  I believe the burning sensation in his mouth could either reflect a mild yeast infection or burning mouth syndrome.  I see no evidence of lichen planus.  Therefore I will try the patient on nystatin 1 teaspoon swish and swallow 4 times daily for 1 week and then reassess the patient.  I believe the headache is likely due to sinusitis.  I explained to the patient that we cannot address all of his issues and a 15-minute office visit.  He is long overdue for a complete physical exam.  I recommended that he schedule a physical exam next week so that we can obtain fasting lab work along with his EKG and his prostate cancer screening and also reassess to see if his diarrhea which I believe his IBS has improved and to see if the burning sensation in his mouth has improved

## 2019-02-15 ENCOUNTER — Other Ambulatory Visit: Payer: Self-pay

## 2019-02-15 ENCOUNTER — Encounter: Payer: Self-pay | Admitting: Family Medicine

## 2019-02-15 ENCOUNTER — Ambulatory Visit (INDEPENDENT_AMBULATORY_CARE_PROVIDER_SITE_OTHER): Payer: Medicare HMO | Admitting: Family Medicine

## 2019-02-15 VITALS — BP 140/78 | HR 88 | Temp 98.4°F | Resp 16 | Ht 71.0 in | Wt 257.0 lb

## 2019-02-15 DIAGNOSIS — Z125 Encounter for screening for malignant neoplasm of prostate: Secondary | ICD-10-CM | POA: Diagnosis not present

## 2019-02-15 DIAGNOSIS — E291 Testicular hypofunction: Secondary | ICD-10-CM

## 2019-02-15 DIAGNOSIS — I2510 Atherosclerotic heart disease of native coronary artery without angina pectoris: Secondary | ICD-10-CM | POA: Diagnosis not present

## 2019-02-15 DIAGNOSIS — E118 Type 2 diabetes mellitus with unspecified complications: Secondary | ICD-10-CM

## 2019-02-15 DIAGNOSIS — Z0001 Encounter for general adult medical examination with abnormal findings: Secondary | ICD-10-CM | POA: Diagnosis not present

## 2019-02-15 DIAGNOSIS — E7800 Pure hypercholesterolemia, unspecified: Secondary | ICD-10-CM | POA: Diagnosis not present

## 2019-02-15 DIAGNOSIS — Z0000 Encounter for general adult medical examination without abnormal findings: Secondary | ICD-10-CM

## 2019-02-15 NOTE — Progress Notes (Signed)
Subjective:    Patient ID: Cameron Daniel, male    DOB: 19-Aug-1946, 73 y.o.   MRN: [id number]  HPI Patient is a 73 year old Caucasian male here today for Medicare wellness exam.  Past medical history is significant for coronary artery disease.  Patient also has a history of angioplasty.  Also has type 2 diabetes mellitus, hypertension, hyperlipidemia, and obesity with a BMI of 35.  Last colonoscopy was in 2018 and was significant for 3 polyps.  He is due for repeat colonoscopy in 2023.  He is due for prostate cancer screening.  He has a history of hypogonadism and is currently on testosterone replacement every 2 weeks with testosterone injections.  Therefore we are monitoring his PSA more frequently.  He denies any fatigue.  He does have some erectile dysfunction.  He denies any chest pain shortness of breath or dyspnea on exertion.  He denies any neuropathy in his feet.  He denies any numbness in his feet.  Diabetic foot exam was performed today and is normal.  He denies any blood in stool.  He denies any hemoptysis or coughing. Past Medical History:  Diagnosis Date  . Allergic rhinitis, cause unspecified   . Allergy   . Anxiety   . Arthritis   . Cancer ([place])   . Cataract   . Coronary atherosclerosis of unspecified type of vessel, native or graft    1985 , arthroplasty x 1   . Depression    PTSD   . Diverticulosis of colon (without mention of hemorrhage)   . Esophageal reflux   . Esophageal stricture   . Flushing   . Hemorrhoids   . Irritable bowel syndrome   . Myocardial infarction ([place])   . Nocturia   . Obesity, unspecified   . Other acquired absence of organ   . Other and unspecified hyperlipidemia   . Other malaise and fatigue   . Personal history of colonic polyps   . Psychosexual dysfunction with inhibited sexual excitement   . Type II or unspecified type diabetes mellitus without mention of complication, not stated as uncontrolled   . Unspecified essential hypertension     Past Surgical History:  Procedure Laterality Date  . ADENOIDECTOMY    . CARPAL TUNNEL RELEASE     left   . CHOLECYSTECTOMY    . COLON RESECTION     18 inches  . COLON SURGERY  2013  . CORONARY ANGIOPLASTY     1985   . ELBOW BURSA SURGERY    . EYE SURGERY    . POLYPECTOMY    . SHOULDER SURGERY     Rt. Shoulder  . TONSILLECTOMY     Current Outpatient Medications on File Prior to Visit  Medication Sig Dispense Refill  . aspirin 81 MG tablet Take 81 mg by mouth every morning.     . Cholecalciferol (VITAMIN D3) 1000 units CAPS Take 1,000 Units by mouth daily.    . citalopram (CELEXA) 10 MG tablet Take 2 tablets (20 mg total) by mouth daily. 180 tablet 3  . colestipol (COLESTID) 5 g packet Take 5 g by mouth 2 (two) times daily.    . diphenoxylate-atropine (LOMOTIL) 2.5-0.025 MG tablet Take 2 tablets by mouth 4 (four) times daily as needed for diarrhea or loose stools. 30 tablet 0  . fexofenadine (ALLEGRA) 180 MG tablet Take 1 tablet (180 mg total) by mouth daily as needed. For allergies 90 tablet 0  . finasteride (PROSCAR) 5 MG tablet Take 1 tablet (  5 mg total) by mouth daily. 90 tablet 3  . fluticasone (FLONASE) 50 MCG/ACT nasal spray Place 2 sprays into both nostrils daily. 16 g 6  . glipiZIDE (GLUCOTROL) 5 MG tablet TAKE 1 TABLET (5 MG TOTAL) BY MOUTH DAILY BEFORE BREAKFAST. 90 tablet 1  . Multiple Vitamin (MULTI-VITAMINS) TABS Take 1 capsule by mouth daily.    Marland Kitchen nystatin (MYCOSTATIN) 100000 UNIT/ML suspension Take 5 mLs (500,000 Units total) by mouth 4 (four) times daily. 60 mL 0  . olmesartan (BENICAR) 20 MG tablet Take 1 tablet (20 mg total) by mouth daily. 90 tablet 3  . omega-3 acid ethyl esters (LOVAZA) 1 g capsule Take 1 capsule (1 g total) by mouth daily. 90 capsule 2  . pantoprazole (PROTONIX) 40 MG tablet TAKE 1 TABLET (40 MG TOTAL) BY MOUTH EVERY MORNING. 90 tablet 3  . pioglitazone (ACTOS) 15 MG tablet TAKE 1 TABLET BY MOUTH EVERY DAY IN THE MORNING 90 tablet 2  .  rosuvastatin (CRESTOR) 10 MG tablet Take 1 tablet (10 mg total) by mouth daily. 90 tablet 3  . testosterone cypionate (DEPOTESTOSTERONE CYPIONATE) 200 MG/ML injection INJECT 1 ML EVERY 14 DAYS 6 mL 3   Current Facility-Administered Medications on File Prior to Visit  Medication Dose Route Frequency Provider Last Rate Last Dose  . 0.9 %  sodium chloride infusion  500 mL Intravenous Continuous Irene Shipper, MD      . testosterone cypionate (DEPOTESTOSTERONE CYPIONATE) injection 200 mg  200 mg Intramuscular Q14 Days [name], MD   200 mg at 02/07/19 1224   Allergies  Allergen Reactions  . Doxazosin Mesylate     Reaction=unknown  . Doxazosin Mesylate Other (See Comments)  . Other Other (See Comments)  . Flomax [Tamsulosin Hcl]     Itching   . Jardiance [Empagliflozin] Other (See Comments)    Nausea, dizziness - Severe (per pt)  . Penicillins Rash and Other (See Comments)    Has patient had a PCN reaction causing immediate rash, facial/tongue/throat swelling, SOB or lightheadedness with hypotension: No Has patient had a PCN reaction causing severe rash involving mucus membranes or skin necrosis: No Has patient had a PCN reaction that required hospitalization No Has patient had a PCN reaction occurring within the last 10 years: No If all of the above answers are "NO", then may proceed with Cephalosporin use.   . Sulfonamide Derivatives Rash  . Trulicity [Dulaglutide] Nausea And Vomiting    N&V, Dizziness   Social History   Socioeconomic History  . Marital status: Married    Spouse name: Not on file  . Number of children: 1  . Years of education: Not on file  . Highest education level: Not on file  Occupational History  . 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 Financial resource strain: Not on file  . Food insecurity    Worry: Not on file    Inability: Not on file  . Transportation needs    Medical: Not on file    Non-medical: Not on file  Tobacco Use  . Smoking  status: Former Smoker    Quit date: 08/30/1983    Years since quitting: 35.4  . Smokeless tobacco: Former Systems developer  . Tobacco comment: Quit 30 years ago.  Substance and Sexual Activity  . Alcohol use: Yes    Comment: rare  . Drug use: No  . Sexual activity: Yes  Lifestyle  . Physical activity    Days per week: Not on file  Minutes per session: Not on file  . Stress: Not on file  Relationships  . Social Herbalist on phone: Not on file    Gets together: Not on file    Attends religious service: Not on file    Active member of club or organization: Not on file    Attends meetings of clubs or organizations: Not on file    Relationship status: Not on file  . Intimate partner violence    Fear of current or ex partner: Not on file    Emotionally abused: Not on file    Physically abused: Not on file    Forced sexual activity: Not on file  Other Topics Concern  . Not on file  Social History Narrative  . Not on file      Review of Systems  All other systems reviewed and are negative.      Objective:   Physical Exam  Constitutional: He is oriented to person, place, and time. He appears well-developed and well-nourished. No distress.  HENT:  Head: Normocephalic and atraumatic.  Right Ear: External ear normal.  Left Ear: External ear normal.  Nose: Nose normal.  Mouth/Throat: Oropharynx is clear and moist. No oropharyngeal exudate.  Eyes: Pupils are equal, round, and reactive to light. Conjunctivae and EOM are normal. Right eye exhibits no discharge. Left eye exhibits no discharge. No scleral icterus.  Neck: Normal range of motion. Neck supple. No JVD present. No tracheal deviation present. No thyromegaly present.  Cardiovascular: Normal rate, regular rhythm, normal heart sounds and intact distal pulses. Exam reveals no gallop and no friction rub.  No murmur heard. Pulmonary/Chest: Effort normal and breath sounds normal. No stridor. No respiratory distress. He has no  wheezes. He has no rales. He exhibits no tenderness.  Abdominal: Soft. Bowel sounds are normal. He exhibits no distension and no mass. There is no abdominal tenderness. There is no rebound and no guarding.  Genitourinary:    Rectum normal.  Prostate is not enlarged and not tender.  Musculoskeletal:        General: Tenderness present. No deformity or edema.  Lymphadenopathy:    He has no cervical adenopathy.  Neurological: He is alert and oriented to person, place, and time. He displays normal reflexes. No cranial nerve deficit. He exhibits normal muscle tone. Coordination normal.  Skin: Skin is warm. No rash noted. He is not diaphoretic. No erythema. No pallor.  Psychiatric: He has a normal mood and affect. His behavior is normal. Judgment and thought content normal.  Vitals reviewed.         Assessment & Plan:  1. ASCVD (arteriosclerotic cardiovascular disease) Patient has a history of coronary artery disease status post myocardial infarction and angioplasty.  I will check an EKG today.  EKG shows normal sinus rhythm with QRS widening consistent with a fascicular block.  There is also Q waves in lead II, 3, aVF which shows his previous infarction that occurred in 1985.  There is no acute evidence of ischemia.  I will check a fasting lipid panel.  LDL cholesterol goal will be less than 70. - EKG 12-Lead - CBC with Differential/Platelet - COMPLETE METABOLIC PANEL WITH GFR - Lipid panel - Microalbumin, urine  2. Controlled type 2 diabetes mellitus with complication, without long-term current use of insulin (HCC) Check urine microalbumin and hemoglobin A1c.  Goal hemoglobin A1c is less than 6.5 - CBC with Differential/Platelet - COMPLETE METABOLIC PANEL WITH GFR - Lipid panel - Microalbumin,  urine - Hemoglobin A1c  3. Prostate cancer screening Screen for prostate cancer.  Check PSA given his treatment of hypogonadism with testosterone - PSA  4. Hypogonadism in male Check  testosterone level.  Monitor CBC for polycythemia and monitor PSA - Testosterone Total,Free,Bio, Males  5. Pure hypercholesterolemia Check fasting lipid panel.  Goal LDL cholesterol is less than 70. - COMPLETE METABOLIC PANEL WITH GFR - Lipid panel  6. General medical exam Recommended diet exercise and weight loss.  Immunizations are up-to-date. Immunization History  Administered Date(s) Administered  . Influenza,inj,Quad PF,6+ Mos 05/28/2013, 06/14/2016, 05/29/2017, 05/07/2018  . Influenza-Unspecified 05/10/2012, 05/23/2014  . MMR 10/30/2014  . Pneumococcal Conjugate-13 12/08/2016  . Pneumococcal Polysaccharide-23 05/28/2013  . Zoster Recombinat (Shingrix) 08/15/2017, 01/26/2018   Cancer screening is up-to-date.  Next colonoscopy is due in 2023.  Patient denies any issues with memory loss depression or falls

## 2019-02-16 LAB — MICROALBUMIN, URINE: Microalb, Ur: 1.5 mg/dL

## 2019-02-18 LAB — TESTOSTERONE TOTAL,FREE,BIO, MALES
Albumin: 4.3 g/dL (ref 3.6–5.1)
Sex Hormone Binding: 24 nmol/L (ref 22–77)
Testosterone, Bioavailable: 305.7 ng/dL — ABNORMAL HIGH (ref 15.0–150.0)
Testosterone, Free: 155.2 pg/mL — ABNORMAL HIGH (ref 6.0–73.0)
Testosterone: 790 ng/dL (ref 250–827)

## 2019-02-18 LAB — COMPLETE METABOLIC PANEL WITH GFR
AG Ratio: 2.2 (calc) (ref 1.0–2.5)
ALT: 24 U/L (ref 9–46)
AST: 23 U/L (ref 10–35)
Albumin: 4.3 g/dL (ref 3.6–5.1)
Alkaline phosphatase (APISO): 52 U/L (ref 35–144)
BUN: 13 mg/dL (ref 7–25)
CO2: 26 mmol/L (ref 20–32)
Calcium: 10.5 mg/dL — ABNORMAL HIGH (ref 8.6–10.3)
Chloride: 99 mmol/L (ref 98–110)
Creat: 0.98 mg/dL (ref 0.70–1.18)
GFR, Est African American: 89 mL/min/{1.73_m2} (ref >=60)
GFR, Est Non African American: 77 mL/min/{1.73_m2} (ref >=60)
Globulin: 2 g/dL (calc) (ref 1.9–3.7)
Glucose, Bld: 153 mg/dL — ABNORMAL HIGH (ref 65–99)
Potassium: 4.5 mmol/L (ref 3.5–5.3)
Sodium: 139 mmol/L (ref 135–146)
Total Bilirubin: 0.7 mg/dL (ref 0.2–1.2)
Total Protein: 6.3 g/dL (ref 6.1–8.1)

## 2019-02-18 LAB — LIPID PANEL
Cholesterol: 145 mg/dL (ref <200)
HDL: 55 mg/dL (ref >=40)
LDL Cholesterol (Calc): 59 mg/dL (calc)
Non-HDL Cholesterol (Calc): 90 mg/dL (calc) (ref <130)
Total CHOL/HDL Ratio: 2.6 (calc) (ref <5.0)
Triglycerides: 260 mg/dL — ABNORMAL HIGH (ref <150)

## 2019-02-18 LAB — CBC WITH DIFFERENTIAL/PLATELET
Absolute Monocytes: 525 cells/uL (ref 200–950)
Basophils Absolute: 43 cells/uL (ref 0–200)
Basophils Relative: 0.7 %
Eosinophils Absolute: 110 cells/uL (ref 15–500)
Eosinophils Relative: 1.8 %
HCT: 52.3 % — ABNORMAL HIGH (ref 38.5–50.0)
Hemoglobin: 17.3 g/dL — ABNORMAL HIGH (ref 13.2–17.1)
Lymphs Abs: 2056 cells/uL (ref 850–3900)
MCH: 30.8 pg (ref 27.0–33.0)
MCHC: 33.1 g/dL (ref 32.0–36.0)
MCV: 93.1 fL (ref 80.0–100.0)
MPV: 10.1 fL (ref 7.5–12.5)
Monocytes Relative: 8.6 %
Neutro Abs: 3367 cells/uL (ref 1500–7800)
Neutrophils Relative %: 55.2 %
Platelets: 180 10*3/uL (ref 140–400)
RBC: 5.62 10*6/uL (ref 4.20–5.80)
RDW: 14.5 % (ref 11.0–15.0)
Total Lymphocyte: 33.7 %
WBC: 6.1 10*3/uL (ref 3.8–10.8)

## 2019-02-18 LAB — HEMOGLOBIN A1C
Hgb A1c MFr Bld: 7 % of total Hgb — ABNORMAL HIGH (ref <5.7)
Mean Plasma Glucose: 154 (calc)
eAG (mmol/L): 8.5 (calc)

## 2019-02-18 LAB — PSA: PSA: 2 ng/mL (ref <=4.0)

## 2019-02-20 DIAGNOSIS — E119 Type 2 diabetes mellitus without complications: Secondary | ICD-10-CM | POA: Diagnosis not present

## 2019-02-25 DIAGNOSIS — M5136 Other intervertebral disc degeneration, lumbar region: Secondary | ICD-10-CM | POA: Diagnosis not present

## 2019-02-26 ENCOUNTER — Ambulatory Visit (INDEPENDENT_AMBULATORY_CARE_PROVIDER_SITE_OTHER): Payer: Medicare HMO

## 2019-02-26 ENCOUNTER — Telehealth: Payer: Self-pay | Admitting: *Deleted

## 2019-02-26 ENCOUNTER — Other Ambulatory Visit: Payer: Self-pay

## 2019-02-26 DIAGNOSIS — R7989 Other specified abnormal findings of blood chemistry: Secondary | ICD-10-CM

## 2019-02-26 DIAGNOSIS — E291 Testicular hypofunction: Secondary | ICD-10-CM

## 2019-02-26 NOTE — Progress Notes (Signed)
Patient came in today to receive his biweekly testosterone injection. Injection was given in his left ventrogluteal muscle. He tolerated well. Advised to return in 14 days.

## 2019-02-26 NOTE — Telephone Encounter (Signed)
Call placed off the recall list to schedule for yearly f/u with Melina Copa, PA-C.  Pt has answered not to the following covid19 prescreening questions.        COVID-19 Pre-Screening Questions:  . In the past 7 to 10 days have you had a cough,  shortness of breath, headache, congestion, fever (100 or greater) body aches, chills, sore throat, or sudden loss of taste or sense of smell? . Have you been around anyone with known Covid 19. . Have you been around anyone who is awaiting Covid 19 test results in the past 7 to 10 days? . Have you been around anyone who has been exposed to Covid 19, or has mentioned symptoms of Covid 19 within the past 7 to 10 days?  If you have any concerns/questions about symptoms patients report during screening (either on the phone or at threshold). Contact the provider seeing the patient or DOD for further guidance.  If neither are available contact a member of the leadership team.

## 2019-02-27 ENCOUNTER — Other Ambulatory Visit: Payer: Self-pay | Admitting: Family Medicine

## 2019-02-27 MED ORDER — FINASTERIDE 5 MG PO TABS: 5.0000 mg | ORAL_TABLET | Freq: Every day | ORAL | 3 refills | Status: DC

## 2019-02-27 MED ORDER — CITALOPRAM HYDROBROMIDE 10 MG PO TABS: 20.0000 mg | ORAL_TABLET | Freq: Every day | ORAL | 3 refills | Status: DC

## 2019-03-04 ENCOUNTER — Telehealth: Payer: Self-pay | Admitting: Physician Assistant

## 2019-03-04 ENCOUNTER — Encounter: Payer: Self-pay | Admitting: Physician Assistant

## 2019-03-04 NOTE — Telephone Encounter (Signed)
ERROR

## 2019-03-04 NOTE — Progress Notes (Addendum)
Cardiology Office Note    Date:  03/05/2019  ID:  Cameron, Daniel September 06, 1945, MRN [id number] PCP:  Cameron Frizzle, MD  Cardiologist:  [name], MD   Chief Complaint: f/u CAD  History of Present Illness:  Cameron Daniel is a 73 y.o. male with history of CAD with MI in 1985, HTN, HLD, NIDDM, former tobacco abuse, orthostasis, GERD, diverticulosis, IBS, depression, esophageal stricture who presents for 1 year follow-up. Details of his coronary artery disease are not available but someone wrote in his PMH in CAD that he had "arthroplasty x1." Per Dr. Maren Daniel prior note, Nuclear study 12/20/2007 showed inferolateral scar, trivial ischemia, EF 55%. Aortoiliac duplex 2014 showed no AAA. He has not required any further cardiac testing in recent years. Last labs 01/2019 showed A1c 7.0, LDL 59, triglycerides 260, Cr 0.98, K 4.5, calcium 10.5, Hgb 17.3, normal plt.   He returns for follow-up feeling great. 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 He has no angina, SOB, edema, dizziness, syncope, palpitations. He follows his BP at home and it is 120-130s/70s per his report. He has a hx of white coat HTN. Repeat BP by me at the end of the visit was 132/76. He admits his diet could be better.   Past Medical History:  Diagnosis Date  . Allergic rhinitis, cause unspecified   . Allergy   . Anxiety   . Arthritis   . Cancer ([place])   . Cataract   . Coronary atherosclerosis of unspecified type of vessel, native or graft    a. Details unclear, someone previously wrote "1985 , arthroplasty x 1."   . Depression    PTSD   . Diverticulosis of colon (without mention of hemorrhage)   . Esophageal reflux   . Esophageal stricture   . Essential hypertension   . Flushing   . Hemorrhoids   . Hyperlipidemia   . Irritable bowel syndrome   . Myocardial infarction ([place])   . Nocturia   . Obesity, unspecified   . Other acquired absence of  organ   . Other malaise and fatigue   . Personal history of colonic polyps   . Psychosexual dysfunction with inhibited sexual excitement   . Type II or unspecified type diabetes mellitus without mention of complication, not stated as uncontrolled     Past Surgical History:  Procedure Laterality Date  . ADENOIDECTOMY    . CARPAL TUNNEL RELEASE     left   . CHOLECYSTECTOMY    . COLON RESECTION     18 inches  . COLON SURGERY  2013  . CORONARY ANGIOPLASTY     1985   . ELBOW BURSA SURGERY    . EYE SURGERY    . POLYPECTOMY    . SHOULDER SURGERY     Rt. Shoulder  . TONSILLECTOMY      Current Medications: Current Meds  Medication Sig  . aspirin 81 MG tablet Take 81 mg by mouth every morning.   . Cholecalciferol (VITAMIN D3) 1000 units CAPS Take 1,000 Units by mouth daily.  . citalopram (CELEXA) 10 MG tablet Take 2 tablets (20 mg total) by mouth daily.  . colestipol (COLESTID) 5 g packet Take 5 g by mouth 2 (two) times daily.  . fexofenadine (ALLEGRA) 180 MG tablet Take 1 tablet (180 mg total) by mouth daily as needed. For allergies  . finasteride (PROSCAR) 5 MG tablet Take 1 tablet (5 mg total) by  mouth daily.  . fluticasone (FLONASE) 50 MCG/ACT nasal spray Place 2 sprays into both nostrils daily.  Marland Kitchen glipiZIDE (GLUCOTROL) 5 MG tablet TAKE 1 TABLET (5 MG TOTAL) BY MOUTH DAILY BEFORE BREAKFAST.  . Multiple Vitamin (MULTI-VITAMINS) TABS Take 1 capsule by mouth daily.  Marland Kitchen olmesartan (BENICAR) 20 MG tablet Take 1 tablet (20 mg total) by mouth daily.  Marland Kitchen omega-3 acid ethyl esters (LOVAZA) 1 g capsule Take 1 capsule (1 g total) by mouth daily.  . pantoprazole (PROTONIX) 40 MG tablet TAKE 1 TABLET (40 MG TOTAL) BY MOUTH EVERY MORNING.  . pioglitazone (ACTOS) 15 MG tablet Take 15 mg by mouth daily. Take 1 tab twice per dayl 1 tab in the  morning and 1 tab in the evening  . rosuvastatin (CRESTOR) 10 MG tablet Take 1 tablet (10 mg total) by mouth daily.  Marland Kitchen testosterone cypionate  (DEPOTESTOSTERONE CYPIONATE) 200 MG/ML injection INJECT 1 ML EVERY 14 DAYS   Current Facility-Administered Medications for the 03/05/19 encounter (Office Visit) with Cameron Pitter, PA-C  Medication  . 0.9 %  sodium chloride infusion  . testosterone cypionate (DEPOTESTOSTERONE CYPIONATE) injection 200 mg     Allergies:   Doxazosin mesylate, Doxazosin mesylate, Other, Flomax [tamsulosin hcl], Jardiance [empagliflozin], Penicillins, Sulfonamide derivatives, and Trulicity [dulaglutide]   Social History   Socioeconomic History  . Marital status: Married    Spouse name: Not on file  . Number of children: 1  . Years of education: Not on file  . Highest education level: Not on file  Occupational History  . 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 Financial resource strain: Not on file  . Food insecurity    Worry: Not on file    Inability: Not on file  . Transportation needs    Medical: Not on file    Non-medical: Not on file  Tobacco Use  . Smoking status: Former Smoker    Quit date: 08/30/1983    Years since quitting: 35.5  . Smokeless tobacco: Former Systems developer  . Tobacco comment: Quit 30 years ago.  Substance and Sexual Activity  . Alcohol use: Yes    Comment: rare  . Drug use: No  . Sexual activity: Yes  Lifestyle  . Physical activity    Days per week: Not on file    Minutes per session: Not on file  . Stress: Not on file  Relationships  . Social Herbalist on phone: Not on file    Gets together: Not on file    Attends religious service: Not on file    Active member of club or organization: Not on file    Attends meetings of clubs or organizations: Not on file    Relationship status: Not on file  Other Topics Concern  . Not on file  Social History Narrative  . Not on file     Family History:  The patient's family history includes Allergic rhinitis in his father; Coronary artery disease in his mother; Lung cancer in his father. There is no history of Colon cancer,  Heart attack, Hypertension, Stroke, Esophageal cancer, Rectal cancer, Stomach cancer, or Pancreatic cancer.  ROS:   Please see the history of present illness.  All other systems are reviewed and otherwise negative.    PHYSICAL EXAM:   VS:  BP 140/80   Pulse 83   Ht 5\' 11"  (1.803 m)   Wt 254 lb (115.2 kg)   SpO2 98%   BMI 35.43 kg/m   BMI:  Body mass index is 35.43 kg/m. GEN: Well nourished, well developed obese WM in no acute distress HEENT: normocephalic, atraumatic Neck: no JVD, carotid bruits, or masses Cardiac: RRR; no murmurs, rubs, or gallops, no edema  Respiratory:  clear to auscultation bilaterally, normal work of breathing GI: soft, nontender, nondistended, + BS MS: no deformity or atrophy Skin: warm and dry, no rash Neuro:  Alert and Oriented x 3, Strength and sensation are intact, follows commands Psych: euthymic mood, full affect  Wt Readings from Last 3 Encounters:  03/05/19 254 lb (115.2 kg)  02/15/19 257 lb (116.6 kg)  02/07/19 259 lb (117.5 kg)      Studies/Labs Reviewed:   EKG:  EKG was ordered today and personally reviewed by me and demonstrates NSR 83bpm, occasional PVC, LAFB, prior anterolateral infarct - no significant change  Recent Labs: 02/15/2019: ALT 24; BUN 13; Creat 0.98; Hemoglobin 17.3; Platelets 180; Potassium 4.5; Sodium 139   Lipid Panel    Component Value Date/Time   CHOL 145 02/15/2019 1050   TRIG 260 (H) 02/15/2019 1050   TRIG 193 (H) 08/08/2006 0924   HDL 55 02/15/2019 1050   CHOLHDL 2.6 02/15/2019 1050   VLDL 40 (H) 12/15/2016 0815   LDLCALC 59 02/15/2019 1050   LDLDIRECT 49.9 08/26/2013 1011    Additional studies/ records that were reviewed today include: Summarized above   ASSESSMENT & PLAN:   1. CAD - asymptomatic. Continue ASA and lipid control. Prior notes indicate to continue BB but he has not historically been on one. Given h/o orthostasis would not add at present time but consider if additional BP control is  needed in the future. 2. Essential HTN - BP mildly elevated on arrival, but improving by recheck at end of visit. He reports h/o white coat HTN as well as orthostasis so no changes made at today's visit. The patient was instructed to monitor their blood pressure at home and to call if tending to run higher than 130/80. 3. Hyperlipidemia - LDL was well controlled by recent labs but triglycerides could use some work. I discussed referral to lipid clinic to consider changing his fish oil to Vascepa. He would like to discuss with his PCP first. 4. Former tobacco abuse - he remains abstinent. 5. Orthostasis - fortunately has not been an issue recently.  Disposition: F/u with Dr. Meda Coffee in 1 year. Of note, his recent primary care labs also showed some hypercalcemia. Will defer management of this to PCP.  Medication Adjustments/Labs and Tests Ordered: Current medicines are reviewed at length with the patient today.  Concerns regarding medicines are outlined above. Medication changes, Labs and Tests ordered today are summarized above and listed in the Patient Instructions accessible in Encounters.   Signed, Cameron Pitter, PA-C  03/05/2019 1:47 PM    [facility] [address], [place], [place]  [address] Phone: [phone]; Fax: [phone]

## 2019-03-04 NOTE — Telephone Encounter (Signed)
Follow up ° ° ° °  ° ° °COVID-19 Pre-Screening Questions: ° °• In the past 7 to 10 days have you had a cough,  shortness of breath, headache, congestion, fever (100 or greater) body aches, chills, sore throat, or sudden loss of taste or sense of smell?  °NO °• Have you been around anyone with known Covid 19. °NO °• Have you been around anyone who is awaiting Covid 19 test results in the past 7 to 10 days? °NO °• Have you been around anyone who has been exposed to Covid 19, or has mentioned symptoms of Covid 19 within the past 7 to 10 days? °NO ° °If you have any concerns/questions about symptoms patients report during screening (either on the phone or at threshold). Contact the provider seeing the patient or DOD for further guidance.  If neither are available contact a member of the leadership team. ° ° ° °   ° ° ° ° ° °

## 2019-03-04 NOTE — Progress Notes (Deleted)
Cardiology Office Note    Date:  03/04/2019  ID:  Cameron, Daniel 03-Jun-1946, MRN [id number] PCP:  [name], MD  Cardiologist:  [name], MD   Chief Complaint: f/u CAD  History of Present Illness:  Cameron Daniel is a 73 y.o. male with history of CAD with MI in 1985, HTN, HLD, NIDDM, former tobacco abuse, orthostasis, GERD, diverticulosis, IBS, depression, esophageal stricture who presents for 1 year follow-up. Details of his coronary artery disease are not available but someone wrote in his PMH in CAD that he had "arthroplasty x1." Per Dr. Maren [place] prior note, Nuclear study 12/20/2007 showed inferolateral scar, trivial ischemia, EF 55%. Aortoiliac duplex 2014 showed no AAA. He has not required any further cardiac testing in recent years. Last labs 01/2019 showed A1c 7.0, LDL 59, triglycerides 260, Cr 0.98, K 4.5, calcium 10.5, Hgb 17.3, normal plt.  pcp consider vascepa  CAD Essential HTN Hyperlipidemia Former tobacco abuse    Past Medical History:  Diagnosis Date  . Allergic rhinitis, cause unspecified   . Allergy   . Anxiety   . Arthritis   . Cancer ([place])   . Cataract   . Coronary atherosclerosis of unspecified type of vessel, native or graft    a. Details unclear, someone previously wrote "1985 , arthroplasty x 1."   . Depression    PTSD   . Diverticulosis of colon (without mention of hemorrhage)   . Esophageal reflux   . Esophageal stricture   . Essential hypertension   . Flushing   . Hemorrhoids   . Hyperlipidemia   . Irritable bowel syndrome   . Myocardial infarction ([place])   . Nocturia   . Obesity, unspecified   . Other acquired absence of organ   . Other malaise and fatigue   . Personal history of colonic polyps   . Psychosexual dysfunction with inhibited sexual excitement   . Type II or unspecified type diabetes mellitus without mention of complication, not stated as uncontrolled     Past Surgical History:  Procedure Laterality  Date  . ADENOIDECTOMY    . CARPAL TUNNEL RELEASE     left   . CHOLECYSTECTOMY    . COLON RESECTION     18 inches  . COLON SURGERY  2013  . CORONARY ANGIOPLASTY     1985   . ELBOW BURSA SURGERY    . EYE SURGERY    . POLYPECTOMY    . SHOULDER SURGERY     Rt. Shoulder  . TONSILLECTOMY      Current Medications: No outpatient medications have been marked as taking for the 03/05/19 encounter (Appointment) with Charlie Pitter, PA-C.   Current Facility-Administered Medications for the 03/05/19 encounter (Appointment) with Charlie Pitter, PA-C  Medication  . 0.9 %  sodium chloride infusion  . testosterone cypionate (DEPOTESTOSTERONE CYPIONATE) injection 200 mg   ***   Allergies:   Doxazosin mesylate, Doxazosin mesylate, Other, Flomax [tamsulosin hcl], Jardiance [empagliflozin], Penicillins, Sulfonamide derivatives, and Trulicity [dulaglutide]   Social History   Socioeconomic History  . Marital status: Married    Spouse name: Not on file  . Number of children: 1  . Years of education: Not on file  . Highest education level: Not on file  Occupational History  . 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 Financial resource strain: Not on file  . Food insecurity    Worry: Not on file    Inability: Not on file  . Transportation needs  Medical: Not on file    Non-medical: Not on file  Tobacco Use  . Smoking status: Former Smoker    Quit date: 08/30/1983    Years since quitting: 35.5  . Smokeless tobacco: Former Systems developer  . Tobacco comment: Quit 30 years ago.  Substance and Sexual Activity  . Alcohol use: Yes    Comment: rare  . Drug use: No  . Sexual activity: Yes  Lifestyle  . Physical activity    Days per week: Not on file    Minutes per session: Not on file  . Stress: Not on file  Relationships  . Social Herbalist on phone: Not on file    Gets together: Not on file    Attends religious service: Not on file    Active member of club or organization: Not on file     Attends meetings of clubs or organizations: Not on file    Relationship status: Not on file  Other Topics Concern  . Not on file  Social History Narrative  . Not on file     Family History:  The patient's ***family history includes Allergic rhinitis in his father; Coronary artery disease in his mother; Lung cancer in his father. There is no history of Colon cancer, Heart attack, Hypertension, Stroke, Esophageal cancer, Rectal cancer, Stomach cancer, or Pancreatic cancer.  ROS:   Please see the history of present illness. Otherwise, review of systems is positive for ***.  All other systems are reviewed and otherwise negative.    PHYSICAL EXAM:   VS:  There were no vitals taken for this visit.  BMI: There is no height or weight on file to calculate BMI. GEN: Well nourished, well developed, in no acute distress HEENT: normocephalic, atraumatic Neck: no JVD, carotid bruits, or masses Cardiac: ***RRR; no murmurs, rubs, or gallops, no edema  Respiratory:  clear to auscultation bilaterally, normal work of breathing GI: soft, nontender, nondistended, + BS MS: no deformity or atrophy Skin: warm and dry, no rash Neuro:  Alert and Oriented x 3, Strength and sensation are intact, follows commands Psych: euthymic mood, full affect  Wt Readings from Last 3 Encounters:  02/15/19 257 lb (116.6 kg)  02/07/19 259 lb (117.5 kg)  11/07/18 259 lb (117.5 kg)      Studies/Labs Reviewed:   EKG:  EKG was ordered today and personally reviewed by me and demonstrates *** EKG was not ordered today.***  Recent Labs: 02/15/2019: ALT 24; BUN 13; Creat 0.98; Hemoglobin 17.3; Platelets 180; Potassium 4.5; Sodium 139   Lipid Panel    Component Value Date/Time   CHOL 145 02/15/2019 1050   TRIG 260 (H) 02/15/2019 1050   TRIG 193 (H) 08/08/2006 0924   HDL 55 02/15/2019 1050   CHOLHDL 2.6 02/15/2019 1050   VLDL 40 (H) 12/15/2016 0815   LDLCALC 59 02/15/2019 1050   LDLDIRECT 49.9 08/26/2013 1011     Additional studies/ records that were reviewed today include: Summarized above.***    ASSESSMENT & PLAN:   1. ***  Disposition: F/u with ***   Medication Adjustments/Labs and Tests Ordered: Current medicines are reviewed at length with the patient today.  Concerns regarding medicines are outlined above. Medication changes, Labs and Tests ordered today are summarized above and listed in the Patient Instructions accessible in Encounters.   Signed, Charlie Pitter, PA-C  03/04/2019 9:02 AM    [facility] [address], [place], [place]  [address] Phone: [phone];  Fax: [phone]  

## 2019-03-05 ENCOUNTER — Other Ambulatory Visit: Payer: Self-pay

## 2019-03-05 ENCOUNTER — Encounter: Payer: Self-pay | Admitting: Physician Assistant

## 2019-03-05 ENCOUNTER — Ambulatory Visit: Payer: Medicare HMO | Admitting: Physician Assistant

## 2019-03-05 VITALS — BP 140/80 | HR 83 | Ht 71.0 in | Wt 254.0 lb

## 2019-03-05 DIAGNOSIS — I10 Essential (primary) hypertension: Secondary | ICD-10-CM

## 2019-03-05 DIAGNOSIS — I2510 Atherosclerotic heart disease of native coronary artery without angina pectoris: Secondary | ICD-10-CM

## 2019-03-05 DIAGNOSIS — I951 Orthostatic hypotension: Secondary | ICD-10-CM

## 2019-03-05 DIAGNOSIS — Z87891 Personal history of nicotine dependence: Secondary | ICD-10-CM | POA: Diagnosis not present

## 2019-03-05 DIAGNOSIS — E785 Hyperlipidemia, unspecified: Secondary | ICD-10-CM

## 2019-03-05 NOTE — Patient Instructions (Addendum)
Medication Instructions:  Your physician recommends that you continue on your current medications as directed. Please refer to the Current Medication list given to you today.  If you need a refill on your cardiac medications before your next appointment, please call your pharmacy.   Lab work: None ordered If you have labs (blood work) drawn today and your tests are completely normal, you will receive your results only by: Marland Kitchen MyChart Message (if you have MyChart) OR . A paper copy in the mail If you have any lab test that is abnormal or we need to change your treatment, we will call you to review the results.  Testing/Procedures: None ordered  Follow-Up: At [facility], you and your health needs are our priority.  As part of our continuing mission to provide you with exceptional heart care, we have created designated Provider Care Teams.  These Care Teams include your primary Cardiologist (physician) and Advanced Practice Providers (APPs -  Physician Assistants and Nurse Practitioners) who all work together to provide you with the care you need, when you need it. You will need a follow up appointment in 12 months.  Please call our office 2 months in advance to schedule this appointment.  You may see [name], MD or one of the following Advanced Practice Providers on your designated Care Team:   [place], PA-C Melina Copa, PA-C . Ermalinda Barrios, PA-C  Any Other Special Instructions Will Be Listed Below (If Applicable).  To check your blood pressure, choose a time about 3 hours after taking your blood pressure medicines. Remain seated in a chair for 5 minutes quietly beforehand, then check it. Please monitor your blood pressure occasionally at home. Call us if you tend to get readings of greater than 130 on the top number or 80 on the bottom number.  Please talk to Dr. Dennard Schaumann about his thoughts on changing your fish oil to [facility] for better control of your triglycerides.  Controlling your weight, diet and blood sugar are also important ways to help manage this as well.

## 2019-03-08 ENCOUNTER — Ambulatory Visit: Payer: Medicare HMO | Admitting: Physician Assistant

## 2019-03-12 ENCOUNTER — Other Ambulatory Visit: Payer: Self-pay | Admitting: *Deleted

## 2019-03-12 MED ORDER — PIOGLITAZONE HCL 30 MG PO TABS: 30.0000 mg | ORAL_TABLET | Freq: Every day | ORAL | 1 refills | Status: DC

## 2019-03-14 ENCOUNTER — Ambulatory Visit (INDEPENDENT_AMBULATORY_CARE_PROVIDER_SITE_OTHER): Payer: Medicare HMO

## 2019-03-14 ENCOUNTER — Other Ambulatory Visit: Payer: Self-pay

## 2019-03-14 DIAGNOSIS — E291 Testicular hypofunction: Secondary | ICD-10-CM | POA: Diagnosis not present

## 2019-03-14 DIAGNOSIS — R7989 Other specified abnormal findings of blood chemistry: Secondary | ICD-10-CM

## 2019-03-14 NOTE — Progress Notes (Signed)
Patient came in to receive his biweekly Testosterone injection. Patient per his request received Testosterone injection in the left ventrogluteal muscle. Patient tolerated well.

## 2019-03-15 DIAGNOSIS — M25561 Pain in right knee: Secondary | ICD-10-CM | POA: Diagnosis not present

## 2019-03-19 DIAGNOSIS — M5136 Other intervertebral disc degeneration, lumbar region: Secondary | ICD-10-CM | POA: Diagnosis not present

## 2019-04-02 ENCOUNTER — Ambulatory Visit (INDEPENDENT_AMBULATORY_CARE_PROVIDER_SITE_OTHER): Payer: Medicare HMO

## 2019-04-02 ENCOUNTER — Other Ambulatory Visit: Payer: Self-pay

## 2019-04-02 DIAGNOSIS — E291 Testicular hypofunction: Secondary | ICD-10-CM

## 2019-04-02 DIAGNOSIS — R7989 Other specified abnormal findings of blood chemistry: Secondary | ICD-10-CM

## 2019-04-02 NOTE — Progress Notes (Signed)
Pt came in for testosterone injection. Administered in R gluteal. Pt tolerated well.

## 2019-04-10 DIAGNOSIS — M5136 Other intervertebral disc degeneration, lumbar region: Secondary | ICD-10-CM | POA: Diagnosis not present

## 2019-04-10 DIAGNOSIS — M47896 Other spondylosis, lumbar region: Secondary | ICD-10-CM | POA: Diagnosis not present

## 2019-04-11 ENCOUNTER — Telehealth: Payer: Self-pay | Admitting: *Deleted

## 2019-04-11 ENCOUNTER — Encounter (HOSPITAL_COMMUNITY): Payer: Self-pay

## 2019-04-11 ENCOUNTER — Emergency Department (HOSPITAL_COMMUNITY)
Admission: EM | Admit: 2019-04-11 | Discharge: 2019-04-11 | Disposition: A | Discharge status: Home / Self Care | Payer: No Typology Code available for payment source | Attending: Emergency Medicine | Admitting: Emergency Medicine

## 2019-04-11 ENCOUNTER — Other Ambulatory Visit: Payer: Self-pay

## 2019-04-11 DIAGNOSIS — E119 Type 2 diabetes mellitus without complications: Secondary | ICD-10-CM | POA: Diagnosis not present

## 2019-04-11 DIAGNOSIS — Z79899 Other long term (current) drug therapy: Secondary | ICD-10-CM | POA: Diagnosis not present

## 2019-04-11 DIAGNOSIS — E860 Dehydration: Secondary | ICD-10-CM

## 2019-04-11 DIAGNOSIS — I10 Essential (primary) hypertension: Secondary | ICD-10-CM | POA: Diagnosis not present

## 2019-04-11 DIAGNOSIS — I2510 Atherosclerotic heart disease of native coronary artery without angina pectoris: Secondary | ICD-10-CM | POA: Diagnosis not present

## 2019-04-11 DIAGNOSIS — Z87891 Personal history of nicotine dependence: Secondary | ICD-10-CM | POA: Insufficient documentation

## 2019-04-11 DIAGNOSIS — J3489 Other specified disorders of nose and nasal sinuses: Secondary | ICD-10-CM | POA: Diagnosis not present

## 2019-04-11 DIAGNOSIS — Z7984 Long term (current) use of oral hypoglycemic drugs: Secondary | ICD-10-CM | POA: Insufficient documentation

## 2019-04-11 DIAGNOSIS — R531 Weakness: Secondary | ICD-10-CM | POA: Diagnosis present

## 2019-04-11 DIAGNOSIS — R000 Tachycardia, unspecified: Secondary | ICD-10-CM | POA: Diagnosis not present

## 2019-04-11 DIAGNOSIS — M791 Myalgia, unspecified site: Secondary | ICD-10-CM

## 2019-04-11 LAB — BASIC METABOLIC PANEL
Anion gap: 9 (ref 5–15)
BUN: 15 mg/dL (ref 8–23)
CO2: 29 mmol/L (ref 22–32)
Calcium: 10.2 mg/dL (ref 8.9–10.3)
Chloride: 97 mmol/L — ABNORMAL LOW (ref 98–111)
Creatinine, Ser: 1.21 mg/dL (ref 0.61–1.24)
GFR calc Af Amer: >60 mL/min (ref >60)
GFR calc non Af Amer: 59 mL/min — ABNORMAL LOW (ref >60)
Glucose, Bld: 144 mg/dL — ABNORMAL HIGH (ref 70–99)
Potassium: 4.1 mmol/L (ref 3.5–5.1)
Sodium: 135 mmol/L (ref 135–145)

## 2019-04-11 LAB — URINALYSIS, ROUTINE W REFLEX MICROSCOPIC
Bilirubin Urine: NEGATIVE
Glucose, UA: >=500 mg/dL — AB
Ketones, ur: 5 mg/dL — AB
Leukocytes,Ua: NEGATIVE
Nitrite: NEGATIVE
Protein, ur: 100 mg/dL — AB
Specific Gravity, Urine: 1.031 — ABNORMAL HIGH (ref 1.005–1.030)
pH: 5 (ref 5.0–8.0)

## 2019-04-11 LAB — CBC
HCT: 52.1 % — ABNORMAL HIGH (ref 39.0–52.0)
Hemoglobin: 17 g/dL (ref 13.0–17.0)
MCH: 30.2 pg (ref 26.0–34.0)
MCHC: 32.6 g/dL (ref 30.0–36.0)
MCV: 92.7 fL (ref 80.0–100.0)
Platelets: 126 10*3/uL — ABNORMAL LOW (ref 150–400)
RBC: 5.62 MIL/uL (ref 4.22–5.81)
RDW: 14.6 % (ref 11.5–15.5)
WBC: 12.1 10*3/uL — ABNORMAL HIGH (ref 4.0–10.5)
nRBC: 0 % (ref 0.0–0.2)

## 2019-04-11 LAB — CK: Total CK: 357 U/L (ref 49–397)

## 2019-04-11 LAB — CBG MONITORING, ED
Glucose-Capillary: 163 mg/dL — ABNORMAL HIGH (ref 70–99)
Glucose-Capillary: 179 mg/dL — ABNORMAL HIGH (ref 70–99)

## 2019-04-11 MED ORDER — SODIUM CHLORIDE 0.9 % IV BOLUS
1000.0000 mL | Freq: Once | INTRAVENOUS | Status: AC
  Administered 2019-04-11: 1000 mL via INTRAVENOUS

## 2019-04-11 NOTE — Telephone Encounter (Signed)
Received call from patient. States that he feels he may be dehydrated. Reports that he was working outside yesterday and may have not drank enough.   Reports that he has dizziness and feels very jittery. Also reports clear nasal drainage and slightly productive cough. Denies fever, but does state he has cold chills and tremors.   HR 100- 120. BP 148/ 73. T 98.6 oral.  Advised to go to ER for eval as he may require COVID testing as well as IV fluids. Verbalized understanding.

## 2019-04-11 NOTE — Discharge Instructions (Signed)
Your lab work overall has been reassuring today, please make sure you are drinking plenty of fluids throughout the day at home especially if you are working outside.  Please follow-up with your primary care doctor within the next week for recheck.  Return for any new or worsening symptoms.

## 2019-04-11 NOTE — ED Provider Notes (Signed)
[facility] DEPT Provider Note   CSN: [id number] Arrival date & time: 04/11/19  1217     History   Chief Complaint Chief Complaint  Patient presents with  . Dehydration    HPI Cameron Daniel is a 73 y.o. male.     Cameron Daniel is a 73 y.o. male with a history of hypertension, hyperlipidemia, MI, CAD, seasonal allergies, arthritis, who presents to the emergency department with concerns for dehydration.  Patient reports that he has been working outside in the heat for the past several days and yesterday and today he started to just feel "not right".  He reports that last night he felt a little bit weak and shaky, he was worried his blood sugar was low, checked it but it was normal.  He reports that his muscles have been a bit achy.  He had one episode of nausea but no vomiting, diarrhea or abdominal pain, no chest pain, shortness of breath, cough or fevers.  He reports that he constantly has a runny nose but has terrible seasonal allergies and they have been acting up recently despite him taking Allegra.  He denies any focal weakness or numbness, no vision changes or changes in speech.  Reports that he has had a mild frontal headache, but no severe sudden onset headache.  Reports when he stands up he is felt a little bit lightheaded and last night noticed that his heart rate was a bit high and thinks he is dehydrated.  He has been trying to drink fluids at home.  No other aggravating or alleviating factors.     Past Medical History:  Diagnosis Date  . Allergic rhinitis, cause unspecified   . Allergy   . Anxiety   . Arthritis   . Cancer ([place])   . Cataract   . Coronary atherosclerosis of unspecified type of vessel, native or graft    a. Details unclear, someone previously wrote "1985 , arthroplasty x 1."   . Depression    PTSD   . Diverticulosis of colon (without mention of hemorrhage)   . Esophageal reflux   . Esophageal stricture   .  Essential hypertension   . Flushing   . Hemorrhoids   . Hyperlipidemia   . Irritable bowel syndrome   . Myocardial infarction ([place])   . Nocturia   . Obesity, unspecified   . Other acquired absence of organ   . Other malaise and fatigue   . Personal history of colonic polyps   . Psychosexual dysfunction with inhibited sexual excitement   . Type II or unspecified type diabetes mellitus without mention of complication, not stated as uncontrolled     Patient Active Problem List   Diagnosis Date Noted  . Allergic conjunctivitis 01/23/2018  . Chronic sinusitis 01/23/2018  . Gout 04/06/2012  . DIVERTICULITIS-COLON 06/18/2010  . FACIAL FLUSHING 12/11/2007  . NOCTURIA 12/11/2007  . DIABETES MELLITUS, TYPE II 09/07/2007  . OTHER MALAISE AND FATIGUE 09/07/2007  . Allergic rhinitis 08/16/2007  . HYPERLIPIDEMIA 01/24/2007  . OBESITY 01/24/2007  . ERECTILE DYSFUNCTION 01/24/2007  . HYPERTENSION 01/24/2007  . CORONARY ARTERY DISEASE 01/24/2007  . GERD 01/24/2007  . DIVERTICULOSIS, COLON 01/24/2007  . IRRITABLE BOWEL SYNDROME 01/24/2007  . COLONIC POLYPS, HX OF 01/24/2007  . CHOLECYSTECTOMY, HX OF 01/24/2007    Past Surgical History:  Procedure Laterality Date  . ADENOIDECTOMY    . CARPAL TUNNEL RELEASE     left   . CHOLECYSTECTOMY    . COLON  RESECTION     18 inches  . COLON SURGERY  2013  . CORONARY ANGIOPLASTY     1985   . ELBOW BURSA SURGERY    . EYE SURGERY    . POLYPECTOMY    . SHOULDER SURGERY     Rt. Shoulder  . TONSILLECTOMY          Home Medications    Prior to Admission medications   Medication Sig Start Date End Date Taking? Authorizing Provider  aspirin 81 MG tablet Take 81 mg by mouth every morning.     [provider]  Cholecalciferol (VITAMIN D3) 1000 units CAPS Take 1,000 Units by mouth daily.    [provider]  citalopram (CELEXA) 10 MG tablet Take 2 tablets (20 mg total) by mouth daily. 02/27/19   [name], MD  colestipol  (COLESTID) 5 g packet Take 5 g by mouth 2 (two) times daily.    [provider]  fexofenadine (ALLEGRA) 180 MG tablet Take 1 tablet (180 mg total) by mouth daily as needed. For allergies 05/28/18   Bobbitt, [place] Muta, MD  finasteride (PROSCAR) 5 MG tablet Take 1 tablet (5 mg total) by mouth daily. 02/27/19   [name], MD  fluticasone (FLONASE) 50 MCG/ACT nasal spray Place 2 sprays into both nostrils daily. 10/15/18   Kensett, [place] Nunnery, MD  glipiZIDE (GLUCOTROL) 5 MG tablet TAKE 1 TABLET (5 MG TOTAL) BY MOUTH DAILY BEFORE BREAKFAST. 01/07/19   [name], MD  Multiple Vitamin (MULTI-VITAMINS) TABS Take 1 capsule by mouth daily.    [provider]  olmesartan (BENICAR) 20 MG tablet Take 1 tablet (20 mg total) by mouth daily. 02/19/18   [name], MD  omega-3 acid ethyl esters (LOVAZA) 1 g capsule Take 1 capsule (1 g total) by mouth daily. 11/03/17   Dorothy Spark, MD  pantoprazole (PROTONIX) 40 MG tablet TAKE 1 TABLET (40 MG TOTAL) BY MOUTH EVERY MORNING. 10/04/18   [name], MD  pioglitazone (ACTOS) 30 MG tablet Take 1 tablet (30 mg total) by mouth daily. 03/12/19   [name], MD  rosuvastatin (CRESTOR) 10 MG tablet Take 1 tablet (10 mg total) by mouth daily. 11/26/18   [name], MD  testosterone cypionate (DEPOTESTOSTERONE CYPIONATE) 200 MG/ML injection INJECT 1 ML EVERY 14 DAYS 01/25/19   [name], MD    Family History Family History  Problem Relation Age of Onset  . Lung cancer Father   . Allergic rhinitis Father   . Coronary artery disease Mother   . Colon cancer Neg Hx   . Heart attack Neg Hx   . Hypertension Neg Hx   . Stroke Neg Hx   . Esophageal cancer Neg Hx   . Rectal cancer Neg Hx   . Stomach cancer Neg Hx   . Pancreatic cancer Neg Hx     Social History Social History   Tobacco Use  . Smoking status: Former Smoker    Quit date: 08/30/1983    Years since quitting: 35.6  . Smokeless tobacco: Former Systems developer   . Tobacco comment: Quit 30 years ago.  Substance Use Topics  . Alcohol use: Yes    Comment: rare  . Drug use: No     Allergies   Doxazosin mesylate, Doxazosin mesylate, Other, Flomax [tamsulosin hcl], Jardiance [empagliflozin], Penicillins, Sulfonamide derivatives, and Trulicity [dulaglutide]   Review of Systems Review of Systems  Constitutional: Positive for fatigue. Negative for chills and  fever.  HENT: Positive for rhinorrhea. Negative for congestion and sore throat.   Eyes: Negative for visual disturbance.  Respiratory: Negative for cough and shortness of breath.   Cardiovascular: Negative for chest pain.  Gastrointestinal: Positive for nausea. Negative for abdominal pain, blood in stool, constipation, diarrhea and vomiting.  Genitourinary: Negative for dysuria and frequency.  Musculoskeletal: Positive for myalgias. Negative for arthralgias.  Skin: Negative for color change and rash.  Neurological: Positive for light-headedness and headaches. Negative for dizziness, syncope, facial asymmetry, speech difficulty, weakness and numbness.     Physical Exam Updated Vital Signs BP (!) 156/75   Pulse (!) 110   Temp 98.4 F (36.9 C) (Oral)   Resp (!) 22   SpO2 96%   Physical Exam Vitals signs and nursing note reviewed.  Constitutional:      General: He is not in acute distress.    Appearance: Normal appearance. He is well-developed and normal weight. He is not ill-appearing or diaphoretic.  HENT:     Head: Normocephalic and atraumatic.     Mouth/Throat:     Comments: Mucous membranes slightly dry, oropharynx clear. Eyes:     General:        Right eye: No discharge.        Left eye: No discharge.     Pupils: Pupils are equal, round, and reactive to light.  Neck:     Musculoskeletal: Neck supple.  Cardiovascular:     Rate and Rhythm: Regular rhythm. Tachycardia present.     Pulses: Normal pulses.     Heart sounds: Normal heart sounds. No murmur. No friction rub. No  gallop.   Pulmonary:     Effort: Pulmonary effort is normal. No respiratory distress.     Breath sounds: Normal breath sounds. No wheezing or rales.     Comments: Respirations equal and unlabored, patient able to speak in full sentences, lungs clear to auscultation bilaterally Abdominal:     General: Bowel sounds are normal. There is no distension.     Palpations: Abdomen is soft. There is no mass.     Tenderness: There is no abdominal tenderness. There is no guarding.     Comments: Abdomen soft, nondistended, nontender to palpation in all quadrants without guarding or peritoneal signs  Musculoskeletal:        General: No deformity.     Right lower leg: No edema.     Left lower leg: No edema.     Comments: Bilateral lower extremities warm and well perfused without edema or tenderness.  Skin:    General: Skin is warm and dry.     Capillary Refill: Capillary refill takes less than 2 seconds.     Findings: No rash.  Neurological:     Mental Status: He is alert and oriented to person, place, and time.     Coordination: Coordination normal.     Comments: Speech is clear, able to follow commands CN III-XII intact Normal strength in upper and lower extremities bilaterally including dorsiflexion and plantar flexion, strong and equal grip strength Sensation normal to light and sharp touch Moves extremities without ataxia, coordination intact   Psychiatric:        Mood and Affect: Mood normal.        Behavior: Behavior normal.      ED Treatments / Results  Labs (all labs ordered are listed, but only abnormal results are displayed) Labs Reviewed  BASIC METABOLIC PANEL - Abnormal; Notable for the following components:  Result Value   Chloride 97 (*)    Glucose, Bld 144 (*)    GFR calc non Af Amer 59 (*)    All other components within normal limits  CBC - Abnormal; Notable for the following components:   WBC 12.1 (*)    HCT 52.1 (*)    Platelets 126 (*)    All other components  within normal limits  URINALYSIS, ROUTINE W REFLEX MICROSCOPIC - Abnormal; Notable for the following components:   Color, Urine AMBER (*)    Specific Gravity, Urine 1.031 (*)    Glucose, UA >=500 (*)    Hgb urine dipstick SMALL (*)    Ketones, ur 5 (*)    Protein, ur 100 (*)    Bacteria, UA RARE (*)    All other components within normal limits  CBG MONITORING, ED - Abnormal; Notable for the following components:   Glucose-Capillary 163 (*)    All other components within normal limits  CK    EKG EKG Interpretation  Date/Time:  Thursday April 11 2019 18:29:43 EDT Ventricular Rate:  105 PR Interval:    QRS Duration: 124 QT Interval:  328 QTC Calculation: 434 R Axis:   -34 Text Interpretation:  Sinus tachycardia Multiple ventricular premature complexes Nonspecific intraventricular conduction delay Probable lateral infarct, old No STEMI  Confirmed by Nanda Quinton [id number]) on 04/11/2019 6:57:44 PM Also confirmed by Nanda Quinton [phone]), editor Philomena Doheny [phone])  on 04/12/2019 7:01:34 AM   Radiology No results found.  Procedures Procedures (including critical care time)  Medications Ordered in ED Medications  sodium chloride 0.9 % bolus 1,000 mL (has no administration in time range)     Initial Impression / Assessment and Plan / ED Course  I have reviewed the triage vital signs and the nursing notes.  Pertinent labs & imaging results that were available during my care of the patient were reviewed by me and considered in my medical decision making (see chart for details).  73 year old male presents with concern for dehydration.  Reports he has been working outside in the heat for multiple days.  Today started to feel a bit weak and fatigued.  Reports some generalized muscle aches.  He denies any chest pain or shortness of breath. He has not had any fevers, or cough.  No abdominal pain, denies vomiting or diarrhea.  On evaluation he is initially tachycardic and hypertensive but  vitals otherwise normal.  He has clear lungs, heart with regular rhythm, abdomen nontender to palpation.  No focal neurologic deficits.  Mucous membranes slightly dry.  Initial labs obtained from triage overall reassuring, slight leukocytosis of 12.5, normal hemoglobin, glucose of 144 but no other significant electrolyte derangements, and normal renal function.  Urinalysis with some protein and ketones but no signs of infection.  Patient reporting some muscle aches, CK checked and not significantly elevated.  EKG with sinus tachycardia, no concerning changes.  Patient given 1 L fluid bolus afterwards tachycardia resolved and patient reports he is feeling much better, reporting he is ready to go home.  He is ambulated in the department with steady gait without assistance.  Will discharge home, strict return precautions discussed and PCP follow-up encouraged.  Patient expresses understanding and agreement with plan.  Patient discussed with Dr. Laverta Baltimore, who saw patient as well and agrees with plan.   Final Clinical Impressions(s) / ED Diagnoses   Final diagnoses:  Dehydration  Myalgia    ED Discharge Orders    None  Jacqlyn Larsen, PA-C 04/13/19 0236    Margette Fast, MD 04/13/19 1255

## 2019-04-11 NOTE — ED Notes (Signed)
Pt able to ambulate without assistance and shows significant more confidence in his own ability to move since arrival in room.

## 2019-04-11 NOTE — ED Triage Notes (Signed)
Pt reports "I'm dehydrated from working outside" Pt complains of muscle soreness and feeling shaky. Pt also complains of allergies. Runny nose. Pt ambulatory.

## 2019-04-12 ENCOUNTER — Encounter (HOSPITAL_COMMUNITY): Payer: Self-pay | Admitting: Emergency Medicine

## 2019-04-12 ENCOUNTER — Other Ambulatory Visit: Payer: Self-pay

## 2019-04-12 ENCOUNTER — Emergency Department (HOSPITAL_COMMUNITY): Payer: Medicare HMO

## 2019-04-12 ENCOUNTER — Inpatient Hospital Stay (HOSPITAL_COMMUNITY)
Admission: EM | Admit: 2019-04-12 | Discharge: 2019-05-02 | DRG: 233 | Disposition: A | Discharge status: Home Health | Payer: Medicare HMO | Attending: Surgery | Admitting: Surgery

## 2019-04-12 ENCOUNTER — Observation Stay (HOSPITAL_COMMUNITY): Payer: Medicare HMO

## 2019-04-12 DIAGNOSIS — D62 Acute posthemorrhagic anemia: Secondary | ICD-10-CM | POA: Diagnosis not present

## 2019-04-12 DIAGNOSIS — G8929 Other chronic pain: Secondary | ICD-10-CM | POA: Diagnosis present

## 2019-04-12 DIAGNOSIS — I2510 Atherosclerotic heart disease of native coronary artery without angina pectoris: Secondary | ICD-10-CM

## 2019-04-12 DIAGNOSIS — Z951 Presence of aortocoronary bypass graft: Secondary | ICD-10-CM | POA: Diagnosis not present

## 2019-04-12 DIAGNOSIS — K661 Hemoperitoneum: Secondary | ICD-10-CM

## 2019-04-12 DIAGNOSIS — I5020 Unspecified systolic (congestive) heart failure: Secondary | ICD-10-CM | POA: Clinically undetermined

## 2019-04-12 DIAGNOSIS — Z801 Family history of malignant neoplasm of trachea, bronchus and lung: Secondary | ICD-10-CM

## 2019-04-12 DIAGNOSIS — Z880 Allergy status to penicillin: Secondary | ICD-10-CM

## 2019-04-12 DIAGNOSIS — Z882 Allergy status to sulfonamides status: Secondary | ICD-10-CM

## 2019-04-12 DIAGNOSIS — M25551 Pain in right hip: Secondary | ICD-10-CM

## 2019-04-12 DIAGNOSIS — F4310 Post-traumatic stress disorder, unspecified: Secondary | ICD-10-CM | POA: Diagnosis present

## 2019-04-12 DIAGNOSIS — Z20828 Contact with and (suspected) exposure to other viral communicable diseases: Secondary | ICD-10-CM | POA: Diagnosis present

## 2019-04-12 DIAGNOSIS — I081 Rheumatic disorders of both mitral and tricuspid valves: Secondary | ICD-10-CM | POA: Diagnosis not present

## 2019-04-12 DIAGNOSIS — J189 Pneumonia, unspecified organism: Secondary | ICD-10-CM

## 2019-04-12 DIAGNOSIS — M549 Dorsalgia, unspecified: Secondary | ICD-10-CM | POA: Diagnosis present

## 2019-04-12 DIAGNOSIS — J9811 Atelectasis: Secondary | ICD-10-CM | POA: Diagnosis not present

## 2019-04-12 DIAGNOSIS — F329 Major depressive disorder, single episode, unspecified: Secondary | ICD-10-CM | POA: Diagnosis present

## 2019-04-12 DIAGNOSIS — I472 Ventricular tachycardia: Secondary | ICD-10-CM | POA: Diagnosis not present

## 2019-04-12 DIAGNOSIS — N401 Enlarged prostate with lower urinary tract symptoms: Secondary | ICD-10-CM | POA: Diagnosis present

## 2019-04-12 DIAGNOSIS — K219 Gastro-esophageal reflux disease without esophagitis: Secondary | ICD-10-CM | POA: Diagnosis present

## 2019-04-12 DIAGNOSIS — Z01818 Encounter for other preprocedural examination: Secondary | ICD-10-CM

## 2019-04-12 DIAGNOSIS — K5900 Constipation, unspecified: Secondary | ICD-10-CM | POA: Diagnosis not present

## 2019-04-12 DIAGNOSIS — Z9861 Coronary angioplasty status: Secondary | ICD-10-CM

## 2019-04-12 DIAGNOSIS — J9601 Acute respiratory failure with hypoxia: Secondary | ICD-10-CM | POA: Diagnosis present

## 2019-04-12 DIAGNOSIS — Z7951 Long term (current) use of inhaled steroids: Secondary | ICD-10-CM

## 2019-04-12 DIAGNOSIS — I214 Non-ST elevation (NSTEMI) myocardial infarction: Principal | ICD-10-CM | POA: Clinically undetermined

## 2019-04-12 DIAGNOSIS — E11649 Type 2 diabetes mellitus with hypoglycemia without coma: Secondary | ICD-10-CM | POA: Diagnosis not present

## 2019-04-12 DIAGNOSIS — I272 Pulmonary hypertension, unspecified: Secondary | ICD-10-CM | POA: Diagnosis present

## 2019-04-12 DIAGNOSIS — Z7984 Long term (current) use of oral hypoglycemic drugs: Secondary | ICD-10-CM

## 2019-04-12 DIAGNOSIS — R351 Nocturia: Secondary | ICD-10-CM | POA: Diagnosis present

## 2019-04-12 DIAGNOSIS — J181 Lobar pneumonia, unspecified organism: Secondary | ICD-10-CM | POA: Diagnosis not present

## 2019-04-12 DIAGNOSIS — N179 Acute kidney failure, unspecified: Secondary | ICD-10-CM | POA: Diagnosis not present

## 2019-04-12 DIAGNOSIS — Y840 Cardiac catheterization as the cause of abnormal reaction of the patient, or of later complication, without mention of misadventure at the time of the procedure: Secondary | ICD-10-CM | POA: Diagnosis not present

## 2019-04-12 DIAGNOSIS — R410 Disorientation, unspecified: Secondary | ICD-10-CM | POA: Diagnosis not present

## 2019-04-12 DIAGNOSIS — Z79899 Other long term (current) drug therapy: Secondary | ICD-10-CM

## 2019-04-12 DIAGNOSIS — K5730 Diverticulosis of large intestine without perforation or abscess without bleeding: Secondary | ICD-10-CM | POA: Diagnosis not present

## 2019-04-12 DIAGNOSIS — Z8249 Family history of ischemic heart disease and other diseases of the circulatory system: Secondary | ICD-10-CM

## 2019-04-12 DIAGNOSIS — J9600 Acute respiratory failure, unspecified whether with hypoxia or hypercapnia: Secondary | ICD-10-CM | POA: Diagnosis not present

## 2019-04-12 DIAGNOSIS — D649 Anemia, unspecified: Secondary | ICD-10-CM | POA: Diagnosis not present

## 2019-04-12 DIAGNOSIS — I9581 Postprocedural hypotension: Secondary | ICD-10-CM | POA: Diagnosis not present

## 2019-04-12 DIAGNOSIS — M1611 Unilateral primary osteoarthritis, right hip: Secondary | ICD-10-CM | POA: Diagnosis present

## 2019-04-12 DIAGNOSIS — I110 Hypertensive heart disease with heart failure: Secondary | ICD-10-CM | POA: Diagnosis not present

## 2019-04-12 DIAGNOSIS — I25118 Atherosclerotic heart disease of native coronary artery with other forms of angina pectoris: Secondary | ICD-10-CM | POA: Diagnosis not present

## 2019-04-12 DIAGNOSIS — Z7982 Long term (current) use of aspirin: Secondary | ICD-10-CM

## 2019-04-12 DIAGNOSIS — E871 Hypo-osmolality and hyponatremia: Secondary | ICD-10-CM | POA: Diagnosis not present

## 2019-04-12 DIAGNOSIS — Z4682 Encounter for fitting and adjustment of non-vascular catheter: Secondary | ICD-10-CM | POA: Diagnosis not present

## 2019-04-12 DIAGNOSIS — J90 Pleural effusion, not elsewhere classified: Secondary | ICD-10-CM | POA: Diagnosis not present

## 2019-04-12 DIAGNOSIS — Z01810 Encounter for preprocedural cardiovascular examination: Secondary | ICD-10-CM | POA: Diagnosis not present

## 2019-04-12 DIAGNOSIS — I252 Old myocardial infarction: Secondary | ICD-10-CM

## 2019-04-12 DIAGNOSIS — R05 Cough: Secondary | ICD-10-CM | POA: Diagnosis not present

## 2019-04-12 DIAGNOSIS — I25119 Atherosclerotic heart disease of native coronary artery with unspecified angina pectoris: Secondary | ICD-10-CM | POA: Diagnosis present

## 2019-04-12 DIAGNOSIS — E669 Obesity, unspecified: Secondary | ICD-10-CM | POA: Diagnosis present

## 2019-04-12 DIAGNOSIS — I97630 Postprocedural hematoma of a circulatory system organ or structure following a cardiac catheterization: Secondary | ICD-10-CM | POA: Diagnosis not present

## 2019-04-12 DIAGNOSIS — Z8719 Personal history of other diseases of the digestive system: Secondary | ICD-10-CM

## 2019-04-12 DIAGNOSIS — R079 Chest pain, unspecified: Secondary | ICD-10-CM | POA: Diagnosis not present

## 2019-04-12 DIAGNOSIS — J309 Allergic rhinitis, unspecified: Secondary | ICD-10-CM | POA: Diagnosis present

## 2019-04-12 DIAGNOSIS — Z87891 Personal history of nicotine dependence: Secondary | ICD-10-CM

## 2019-04-12 DIAGNOSIS — E1165 Type 2 diabetes mellitus with hyperglycemia: Secondary | ICD-10-CM | POA: Diagnosis present

## 2019-04-12 DIAGNOSIS — I517 Cardiomegaly: Secondary | ICD-10-CM | POA: Diagnosis not present

## 2019-04-12 DIAGNOSIS — R778 Other specified abnormalities of plasma proteins: Secondary | ICD-10-CM

## 2019-04-12 DIAGNOSIS — I10 Essential (primary) hypertension: Secondary | ICD-10-CM | POA: Diagnosis not present

## 2019-04-12 DIAGNOSIS — E1169 Type 2 diabetes mellitus with other specified complication: Secondary | ICD-10-CM | POA: Diagnosis present

## 2019-04-12 DIAGNOSIS — D72829 Elevated white blood cell count, unspecified: Secondary | ICD-10-CM | POA: Diagnosis not present

## 2019-04-12 DIAGNOSIS — I255 Ischemic cardiomyopathy: Secondary | ICD-10-CM | POA: Diagnosis present

## 2019-04-12 DIAGNOSIS — R740 Nonspecific elevation of levels of transaminase and lactic acid dehydrogenase [LDH]: Secondary | ICD-10-CM | POA: Diagnosis not present

## 2019-04-12 DIAGNOSIS — I340 Nonrheumatic mitral (valve) insufficiency: Secondary | ICD-10-CM | POA: Diagnosis not present

## 2019-04-12 DIAGNOSIS — Z888 Allergy status to other drugs, medicaments and biological substances status: Secondary | ICD-10-CM

## 2019-04-12 DIAGNOSIS — J811 Chronic pulmonary edema: Secondary | ICD-10-CM | POA: Diagnosis not present

## 2019-04-12 DIAGNOSIS — Z9049 Acquired absence of other specified parts of digestive tract: Secondary | ICD-10-CM

## 2019-04-12 DIAGNOSIS — Z6837 Body mass index (BMI) 37.0-37.9, adult: Secondary | ICD-10-CM

## 2019-04-12 DIAGNOSIS — I5021 Acute systolic (congestive) heart failure: Secondary | ICD-10-CM | POA: Diagnosis not present

## 2019-04-12 DIAGNOSIS — E119 Type 2 diabetes mellitus without complications: Secondary | ICD-10-CM | POA: Diagnosis not present

## 2019-04-12 DIAGNOSIS — J810 Acute pulmonary edema: Secondary | ICD-10-CM | POA: Diagnosis not present

## 2019-04-12 DIAGNOSIS — R32 Unspecified urinary incontinence: Secondary | ICD-10-CM | POA: Diagnosis not present

## 2019-04-12 DIAGNOSIS — R918 Other nonspecific abnormal finding of lung field: Secondary | ICD-10-CM | POA: Diagnosis not present

## 2019-04-12 DIAGNOSIS — E785 Hyperlipidemia, unspecified: Secondary | ICD-10-CM | POA: Diagnosis present

## 2019-04-12 DIAGNOSIS — I5043 Acute on chronic combined systolic (congestive) and diastolic (congestive) heart failure: Secondary | ICD-10-CM | POA: Diagnosis not present

## 2019-04-12 DIAGNOSIS — R7989 Other specified abnormal findings of blood chemistry: Secondary | ICD-10-CM | POA: Diagnosis not present

## 2019-04-12 DIAGNOSIS — K683 Retroperitoneal hematoma: Secondary | ICD-10-CM

## 2019-04-12 LAB — BLOOD GAS, ARTERIAL
Acid-base deficit: 3.4 mmol/L — ABNORMAL HIGH (ref 0.0–2.0)
Bicarbonate: 20.3 mmol/L (ref 20.0–28.0)
Drawn by: 11249
FIO2: 100
MECHVT: 550 mL
O2 Saturation: 98.1 %
PEEP: 5 cmH2O
Patient temperature: 39.2
RATE: 16 resp/min
pCO2 arterial: 38.6 mmHg (ref 32.0–48.0)
pH, Arterial: 7.354 (ref 7.350–7.450)
pO2, Arterial: 131 mmHg — ABNORMAL HIGH (ref 83.0–108.0)

## 2019-04-12 LAB — CBC WITH DIFFERENTIAL/PLATELET
Abs Immature Granulocytes: 0.32 10*3/uL — ABNORMAL HIGH (ref 0.00–0.07)
Basophils Absolute: 0.1 10*3/uL (ref 0.0–0.1)
Basophils Relative: 0 %
Eosinophils Absolute: 0 10*3/uL (ref 0.0–0.5)
Eosinophils Relative: 0 %
HCT: 52.2 % — ABNORMAL HIGH (ref 39.0–52.0)
Hemoglobin: 16.6 g/dL (ref 13.0–17.0)
Immature Granulocytes: 2 %
Lymphocytes Relative: 9 %
Lymphs Abs: 1.3 10*3/uL (ref 0.7–4.0)
MCH: 29.5 pg (ref 26.0–34.0)
MCHC: 31.8 g/dL (ref 30.0–36.0)
MCV: 92.7 fL (ref 80.0–100.0)
Monocytes Absolute: 1 10*3/uL (ref 0.1–1.0)
Monocytes Relative: 7 %
Neutro Abs: 12.1 10*3/uL — ABNORMAL HIGH (ref 1.7–7.7)
Neutrophils Relative %: 82 %
Platelets: 141 10*3/uL — ABNORMAL LOW (ref 150–400)
RBC: 5.63 MIL/uL (ref 4.22–5.81)
RDW: 14.5 % (ref 11.5–15.5)
WBC: 14.8 10*3/uL — ABNORMAL HIGH (ref 4.0–10.5)
nRBC: 0 % (ref 0.0–0.2)

## 2019-04-12 LAB — COMPREHENSIVE METABOLIC PANEL
ALT: 29 U/L (ref 0–44)
AST: 41 U/L (ref 15–41)
Albumin: 3.7 g/dL (ref 3.5–5.0)
Alkaline Phosphatase: 56 U/L (ref 38–126)
Anion gap: 14 (ref 5–15)
BUN: 16 mg/dL (ref 8–23)
CO2: 23 mmol/L (ref 22–32)
Calcium: 9.9 mg/dL (ref 8.9–10.3)
Chloride: 95 mmol/L — ABNORMAL LOW (ref 98–111)
Creatinine, Ser: 1.05 mg/dL (ref 0.61–1.24)
GFR calc Af Amer: >60 mL/min (ref >60)
GFR calc non Af Amer: >60 mL/min (ref >60)
Glucose, Bld: 121 mg/dL — ABNORMAL HIGH (ref 70–99)
Potassium: 4.1 mmol/L (ref 3.5–5.1)
Sodium: 132 mmol/L — ABNORMAL LOW (ref 135–145)
Total Bilirubin: 1.5 mg/dL — ABNORMAL HIGH (ref 0.3–1.2)
Total Protein: 6.9 g/dL (ref 6.5–8.1)

## 2019-04-12 LAB — LACTIC ACID, PLASMA: Lactic Acid, Venous: 2.7 mmol/L (ref 0.5–1.9)

## 2019-04-12 LAB — TROPONIN I (HIGH SENSITIVITY)
Troponin I (High Sensitivity): 136 ng/L (ref <18)
Troponin I (High Sensitivity): 150 ng/L (ref <18)

## 2019-04-12 LAB — BRAIN NATRIURETIC PEPTIDE: B Natriuretic Peptide: 359.3 pg/mL — ABNORMAL HIGH (ref 0.0–100.0)

## 2019-04-12 LAB — SARS CORONAVIRUS 2 BY RT PCR (HOSPITAL ORDER, PERFORMED IN ~~LOC~~ HOSPITAL LAB): SARS Coronavirus 2: NEGATIVE

## 2019-04-12 LAB — GLUCOSE, CAPILLARY: Glucose-Capillary: 160 mg/dL — ABNORMAL HIGH (ref 70–99)

## 2019-04-12 LAB — D-DIMER, QUANTITATIVE: D-Dimer, Quant: 0.7 ug/mL-FEU — ABNORMAL HIGH (ref 0.00–0.50)

## 2019-04-12 MED ORDER — IOHEXOL 350 MG/ML SOLN
100.0000 mL | Freq: Once | INTRAVENOUS | Status: AC | PRN
  Administered 2019-04-12: 100 mL via INTRAVENOUS

## 2019-04-12 MED ORDER — SUCCINYLCHOLINE CHLORIDE 20 MG/ML IJ SOLN
INTRAMUSCULAR | Status: AC | PRN
  Administered 2019-04-12: 100 mg via INTRAVENOUS

## 2019-04-12 MED ORDER — PROPOFOL 10 MG/ML IV BOLUS
INTRAVENOUS | Status: AC
  Filled 2019-04-12: qty 20

## 2019-04-12 MED ORDER — ONDANSETRON HCL 4 MG/2ML IJ SOLN
4.0000 mg | Freq: Four times a day (QID) | INTRAMUSCULAR | Status: DC | PRN
  Administered 2019-04-16: 4 mg via INTRAVENOUS
  Filled 2019-04-12: qty 2

## 2019-04-12 MED ORDER — PROPOFOL 10 MG/ML IV BOLUS
INTRAVENOUS | Status: AC | PRN
  Administered 2019-04-12 (×2): 20 mg via INTRAVENOUS

## 2019-04-12 MED ORDER — PROPOFOL 1000 MG/100ML IV EMUL
INTRAVENOUS | Status: AC
  Filled 2019-04-12: qty 100

## 2019-04-12 MED ORDER — IPRATROPIUM-ALBUTEROL 0.5-2.5 (3) MG/3ML IN SOLN
3.0000 mL | Freq: Four times a day (QID) | RESPIRATORY_TRACT | Status: DC
  Administered 2019-04-12 – 2019-04-13 (×4): 3 mL via RESPIRATORY_TRACT
  Filled 2019-04-12 (×4): qty 3

## 2019-04-12 MED ORDER — SODIUM CHLORIDE 0.9 % IV SOLN
INTRAVENOUS | Status: DC
  Administered 2019-04-12: 23:00:00 via INTRAVENOUS

## 2019-04-12 MED ORDER — PIPERACILLIN-TAZOBACTAM 3.375 G IVPB 30 MIN
3.3750 g | Freq: Once | INTRAVENOUS | Status: DC
  Administered 2019-04-12: 3.375 g via INTRAVENOUS
  Filled 2019-04-12: qty 50

## 2019-04-12 MED ORDER — FAMOTIDINE IN NACL 20-0.9 MG/50ML-% IV SOLN
20.0000 mg | Freq: Two times a day (BID) | INTRAVENOUS | Status: DC
  Administered 2019-04-12 – 2019-04-13 (×3): 20 mg via INTRAVENOUS
  Filled 2019-04-12 (×4): qty 50

## 2019-04-12 MED ORDER — SODIUM CHLORIDE 0.9 % IV SOLN
250.0000 mL | INTRAVENOUS | Status: DC
  Administered 2019-04-13: 250 mL via INTRAVENOUS

## 2019-04-12 MED ORDER — SODIUM CHLORIDE 0.9 % IV BOLUS
1000.0000 mL | Freq: Once | INTRAVENOUS | Status: AC
  Administered 2019-04-12: 1000 mL via INTRAVENOUS

## 2019-04-12 MED ORDER — PHENYLEPHRINE HCL-NACL 10-0.9 MG/250ML-% IV SOLN
25.0000 ug/min | INTRAVENOUS | Status: DC
  Administered 2019-04-12: 25 ug/min via INTRAVENOUS
  Filled 2019-04-12: qty 250

## 2019-04-12 MED ORDER — ACETAMINOPHEN 325 MG PO TABS
650.0000 mg | ORAL_TABLET | ORAL | Status: DC | PRN
  Administered 2019-04-13 (×2): 650 mg via ORAL
  Filled 2019-04-12 (×2): qty 2

## 2019-04-12 MED ORDER — PROPOFOL 1000 MG/100ML IV EMUL
INTRAVENOUS | Status: AC | PRN
  Administered 2019-04-12: 30 ug/kg/min via INTRAVENOUS

## 2019-04-12 MED ORDER — SODIUM CHLORIDE 0.9 % IV SOLN
2.0000 g | INTRAVENOUS | Status: AC
  Administered 2019-04-13 – 2019-04-16 (×5): 2 g via INTRAVENOUS
  Filled 2019-04-12: qty 20
  Filled 2019-04-12 (×4): qty 2
  Filled 2019-04-12: qty 20

## 2019-04-12 MED ORDER — ENOXAPARIN SODIUM 40 MG/0.4ML ~~LOC~~ SOLN
40.0000 mg | SUBCUTANEOUS | Status: DC
  Administered 2019-04-12: 40 mg via SUBCUTANEOUS
  Filled 2019-04-12: qty 0.4

## 2019-04-12 MED ORDER — PROPOFOL 1000 MG/100ML IV EMUL: 5.0000 ug/kg/min | INTRAVENOUS | Status: DC

## 2019-04-12 MED ORDER — FENTANYL 2500MCG IN NS 250ML (10MCG/ML) PREMIX INFUSION
0.0000 ug/h | INTRAVENOUS | Status: DC
  Administered 2019-04-13: 50 ug/h via INTRAVENOUS
  Filled 2019-04-12: qty 250

## 2019-04-12 MED ORDER — ETOMIDATE 2 MG/ML IV SOLN
INTRAVENOUS | Status: AC | PRN
  Administered 2019-04-12: 20 mg via INTRAVENOUS

## 2019-04-12 MED ORDER — LORAZEPAM 2 MG/ML IJ SOLN
0.5000 mg | Freq: Once | INTRAMUSCULAR | Status: AC
  Administered 2019-04-12: 0.5 mg via INTRAVENOUS
  Filled 2019-04-12: qty 1

## 2019-04-12 MED ORDER — SODIUM CHLORIDE 0.9 % IV SOLN
500.0000 mg | INTRAVENOUS | Status: AC
  Administered 2019-04-13 – 2019-04-16 (×5): 500 mg via INTRAVENOUS
  Filled 2019-04-12 (×5): qty 500

## 2019-04-12 MED ORDER — VANCOMYCIN HCL IN DEXTROSE 1-5 GM/200ML-% IV SOLN
1000.0000 mg | Freq: Once | INTRAVENOUS | Status: DC
  Filled 2019-04-12: qty 200

## 2019-04-12 MED ORDER — LEVOFLOXACIN IN D5W 750 MG/150ML IV SOLN
750.0000 mg | Freq: Once | INTRAVENOUS | Status: DC
  Filled 2019-04-12: qty 150

## 2019-04-12 MED ORDER — PROPOFOL 10 MG/ML IV BOLUS
INTRAVENOUS | Status: AC | PRN
  Administered 2019-04-12: 20 mg via INTRAVENOUS

## 2019-04-12 NOTE — ED Triage Notes (Signed)
Pt reports that he was here last night and got bad fluids. Reports having chills, cough, runny nose. Pt is anxious and didn't have medication for that.

## 2019-04-12 NOTE — H&P (Signed)
NAME:  Cameron Daniel, MRN:  [id number], DOB:  11-04-1945, LOS: 0 ADMISSION DATE:  04/12/2019, CONSULTATION DATE:  8/14 REFERRING MD:  EDP, CHIEF COMPLAINT:  Respiratory failure, PNA    Brief History   73yo male with hx CAD, HTN, DM who was seen 8/14 in ED with concerns for dehydration after working outside in the heat. He was given IV fluids and d/c. He returned 8/14 with chills, fatigue, cough, dyspnea.  CT chest with RLL consolidation, mildly elevated troponin.  Had progressive respiratory failure with hypoxia, worsening tachypnea and ultimately required intubation in ER.  PCCM called to admit.   History of present illness   73yo male with hx CAD, HTN, DM who was seen 8/14 in ED with concerns for dehydration after working outside in the heat. He was given IV fluids and d/c. He returned 8/14 with chills, fatigue, cough, dyspnea.  CT chest with RLL consolidation, mildly elevated troponin.  Had progressive respiratory failure with hypoxia, worsening tachypnea and ultimately required intubation in ER.  PCCM called to admit. I am concerned about the rapidity of onset of hypoxemia, on no O2 at time of presentation.  Possible ischemic event on top of PNA vs adverse reaction to IV dye?  Post intubation film with centrally located pulmonary edema.  Pneumonia is poorly visualized on plain chest x-ray.  PNA best visualized posteriorly in the right lower lobe on CT scan of the chest.   Past Medical History   Past Medical History:  Diagnosis Date  . Allergic rhinitis, cause unspecified   . Allergy   . Anxiety   . Arthritis   . Cancer ([place])   . Cataract   . Coronary atherosclerosis of unspecified type of vessel, native or graft    a. Details unclear, someone previously wrote "1985 , arthroplasty x 1."   . Depression    PTSD   . Diverticulosis of colon (without mention of hemorrhage)   . Esophageal reflux   . Esophageal stricture   . Essential hypertension   . Flushing   . Hemorrhoids   .  Hyperlipidemia   . Irritable bowel syndrome   . Myocardial infarction ([place])   . Nocturia   . Obesity, unspecified   . Other acquired absence of organ   . Other malaise and fatigue   . Personal history of colonic polyps   . Psychosexual dysfunction with inhibited sexual excitement   . Type II or unspecified type diabetes mellitus without mention of complication, not stated as uncontrolled      Significant Hospital Events     Consults:    Procedures:  ETT 8/14>>>  Significant Diagnostic Tests:  CTA chest 8/14>>>1. RIGHT lower lobe pneumonia. 2. Majority of the peripheral segmental and subsegmental pulmonary arteries cannot be characterized due to extensive patient breathing motion artifact. No central obstructing pulmonary embolism is seen within the main pulmonary arteries bilaterally. 3. Borderline enlarged lymph nodes within the mediastinum and right hilum, likely reactive in nature. 4. Coronary artery calcifications.  Micro Data:  BC x 2 8/14>>>  COVID 8/14>>> NEG  Sputum 8/14>>>  Antimicrobials:  Rocephin 8/14>>> Azithro 8/14>>>  Interim history/subjective:  Intubation admission ICU 04/12/2019  Objective   Blood pressure 108/65, pulse (!) 109, temperature 98.7 F (37.1 C), temperature source Oral, resp. rate (!) 27, height 5\' 11"  (1.803 m), SpO2 96 %.    Vent Mode: PRVC FiO2 (%):  [100 %] 100 % Set Rate:  [16 bmp] 16 bmp Vt Set:  [550 mL] 550  mL PEEP:  [6 cmH20] 6 cmH20 Plateau Pressure:  [29 cmH20] 29 cmH20  No intake or output data in the 24 hours ending 04/12/19 2147 There were no vitals filed for this visit.  Examination: General: Well-developed black male appears stated age HENT: Intubated within normal limits Lungs: Diminished breath sounds bilaterally Cardiovascular: Regular rate rhythm no murmurs or gallops Abdomen: Rotund benign bowel sounds positive Extremities: Trace peripheral edema Neuro: Sedated GU: N/A  Resolved Hospital Problem list      Assessment & Plan:  Acute respiratory failure  Concern for ARDS, etiology unclear, presentation film clear PLAN -  Vent support - 8cc/kg  F/u CXR  F/u ABG  abx as above  Sputum culture pending  Will gently hydrate for pressure  Hx HTN  PLAN -  Hold home anti-HTN   Hx DM (last HgbA1c 7.0) PLAN -  SSI  Hold home DM rx   Hx CAD  Mildly elevated troponin - suspect demand in setting respiratory failure  PLAN -  Trend troponin  Consider echo - no recent echo in system    Sedation needs on vent  PLAN -  PAD protocol with propofol - RASS goal -1   Best practice:  Diet: NPO Pain/Anxiety/Delirium protocol (if indicated): PAD protocol  VAP protocol (if indicated): ordered  DVT prophylaxis: SQ heparin  GI prophylaxis: PPI Glucose control: SSI Mobility: BR Code Status: full Family Communication: done in ER  Disposition:   Labs   CBC: Recent Labs  Lab 04/11/19 1303 04/12/19 1647  WBC 12.1* 14.8*  NEUTROABS  --  12.1*  HGB 17.0 16.6  HCT 52.1* 52.2*  MCV 92.7 92.7  PLT 126* 141*    Basic Metabolic Panel: Recent Labs  Lab 04/11/19 1303 04/12/19 1647  NA 135 132*  K 4.1 4.1  CL 97* 95*  CO2 29 23  GLUCOSE 144* 121*  BUN 15 16  CREATININE 1.21 1.05  CALCIUM 10.2 9.9   GFR: CrCl cannot be calculated (Unknown ideal weight.). Recent Labs  Lab 04/11/19 1303 04/12/19 1647  WBC 12.1* 14.8*    Liver Function Tests: Recent Labs  Lab 04/12/19 1647  AST 41  ALT 29  ALKPHOS 56  BILITOT 1.5*  PROT 6.9  ALBUMIN 3.7   No results for input(s): LIPASE, AMYLASE in the last 168 hours. No results for input(s): AMMONIA in the last 168 hours.  ABG    Component Value Date/Time   HCO3 27.5 (H) 06/11/2007 2256   HCO3 26.9 (H) 06/11/2007 2256   TCO2 27 12/13/2007 2032     Coagulation Profile: No results for input(s): INR, PROTIME in the last 168 hours.  Cardiac Enzymes: Recent Labs  Lab 04/11/19 1846  CKTOTAL 357    HbA1C: Hgb A1c MFr Bld   Date/Time Value Ref Range Status  02/15/2019 10:50 AM 7.0 (H) <5.7 % of total Hgb Final    Comment:    For someone without known diabetes, a hemoglobin A1c value of 6.5% or greater indicates that they may have  diabetes and this should be confirmed with a follow-up  test. . For someone with known diabetes, a value <7% indicates  that their diabetes is well controlled and a value  greater than or equal to 7% indicates suboptimal  control. A1c targets should be individualized based on  duration of diabetes, age, comorbid conditions, and  other considerations. . Currently, no consensus exists regarding use of hemoglobin A1c for diagnosis of diabetes for children. Marland Kitchen   04/16/2018 08:30 AM  7.1 (H) <5.7 % of total Hgb Final    Comment:    For someone without known diabetes, a hemoglobin A1c value of 6.5% or greater indicates that they may have  diabetes and this should be confirmed with a follow-up  test. . For someone with known diabetes, a value <7% indicates  that their diabetes is well controlled and a value  greater than or equal to 7% indicates suboptimal  control. A1c targets should be individualized based on  duration of diabetes, age, comorbid conditions, and  other considerations. . Currently, no consensus exists regarding use of hemoglobin A1c for diagnosis of diabetes for children. .     CBG: Recent Labs  Lab 04/11/19 1300 04/11/19 1843 04/12/19 2043  GLUCAP 163* 179* 160*    Review of Systems:   Unable - pt intubated. As per HPI above obtained from records and staff   Past Medical History  He,  has a past medical history of Allergic rhinitis, cause unspecified, Allergy, Anxiety, Arthritis, Cancer ([place]), Cataract, Coronary atherosclerosis of unspecified type of vessel, native or graft, Depression, Diverticulosis of colon (without mention of hemorrhage), Esophageal reflux, Esophageal stricture, Essential hypertension, Flushing, Hemorrhoids, Hyperlipidemia,  Irritable bowel syndrome, Myocardial infarction ([place]), Nocturia, Obesity, unspecified, Other acquired absence of organ, Other malaise and fatigue, Personal history of colonic polyps, Psychosexual dysfunction with inhibited sexual excitement, and Type II or unspecified type diabetes mellitus without mention of complication, not stated as uncontrolled.   Surgical History    Past Surgical History:  Procedure Laterality Date  . ADENOIDECTOMY    . CARPAL TUNNEL RELEASE     left   . CHOLECYSTECTOMY    . COLON RESECTION     18 inches  . COLON SURGERY  2013  . CORONARY ANGIOPLASTY     1985   . ELBOW BURSA SURGERY    . EYE SURGERY    . POLYPECTOMY    . SHOULDER SURGERY     Rt. Shoulder  . TONSILLECTOMY       Social History   reports that he quit smoking about 35 years ago. He has quit using smokeless tobacco. He reports current alcohol use. He reports that he does not use drugs.   Family History   His family history includes Allergic rhinitis in his father; Coronary artery disease in his mother; Lung cancer in his father. There is no history of Colon cancer, Heart attack, Hypertension, Stroke, Esophageal cancer, Rectal cancer, Stomach cancer, or Pancreatic cancer.   Allergies Allergies  Allergen Reactions  . Doxazosin Mesylate     Reaction=unknown  . Doxazosin Mesylate Other (See Comments)  . Other Other (See Comments)  . Flomax [Tamsulosin Hcl]     Itching   . Jardiance [Empagliflozin] Other (See Comments)    Nausea, dizziness - Severe (per pt)  . Penicillins Rash and Other (See Comments)    Has patient had a PCN reaction causing immediate rash, facial/tongue/throat swelling, SOB or lightheadedness with hypotension: No Has patient had a PCN reaction causing severe rash involving mucus membranes or skin necrosis: No Has patient had a PCN reaction that required hospitalization No Has patient had a PCN reaction occurring within the last 10 years: No If all of the above answers are  "NO", then may proceed with Cephalosporin use.   . Sulfonamide Derivatives Rash  . Trulicity [Dulaglutide] Nausea And Vomiting    N&V, Dizziness     Home Medications  Prior to Admission medications   Medication Sig Start Date End Date  Taking? Authorizing Provider  aspirin 81 MG tablet Take 81 mg by mouth every morning.    Yes [provider]  Cholecalciferol (VITAMIN D3) 1000 units CAPS Take 1,000 Units by mouth daily.   Yes [provider]  citalopram (CELEXA) 10 MG tablet Take 2 tablets (20 mg total) by mouth daily. Patient taking differently: Take 10 mg by mouth daily.  02/27/19  Yes [name], MD  colestipol (COLESTID) 5 g packet Take 5 g by mouth 2 (two) times daily.   Yes [provider]  fexofenadine (ALLEGRA) 180 MG tablet Take 1 tablet (180 mg total) by mouth daily as needed. For allergies Patient taking differently: Take 180 mg by mouth daily as needed for allergies.  05/28/18  Yes [place], [place] Muta, MD  finasteride (PROSCAR) 5 MG tablet Take 1 tablet (5 mg total) by mouth daily. 02/27/19  Yes [name], MD  fluticasone (FLONASE) 50 MCG/ACT nasal spray Place 2 sprays into both nostrils daily. 10/15/18  Yes [place], Modena Nunnery, MD  glipiZIDE (GLUCOTROL) 5 MG tablet TAKE 1 TABLET (5 MG TOTAL) BY MOUTH DAILY BEFORE BREAKFAST. 01/07/19  Yes [name], MD  Multiple Vitamin (MULTI-VITAMINS) TABS Take 1 capsule by mouth daily.   Yes [provider]  olmesartan (BENICAR) 20 MG tablet Take 1 tablet (20 mg total) by mouth daily. Patient taking differently: Take 20 mg by mouth at bedtime.  02/19/18  Yes [name], MD  omega-3 acid ethyl esters (LOVAZA) 1 g capsule Take 1 capsule (1 g total) by mouth daily. Patient taking differently: Take 1 g by mouth at bedtime.  11/03/17  Yes Dorothy Spark, MD  pantoprazole (PROTONIX) 40 MG tablet TAKE 1 TABLET (40 MG TOTAL) BY MOUTH EVERY MORNING. Patient taking differently: Take 40 mg by  mouth daily as needed (heartburn).  10/04/18  Yes [name], MD  pioglitazone (ACTOS) 30 MG tablet Take 1 tablet (30 mg total) by mouth daily. 03/12/19  Yes [name], MD  rosuvastatin (CRESTOR) 10 MG tablet Take 1 tablet (10 mg total) by mouth daily. 11/26/18  Yes [name], MD  testosterone cypionate (DEPOTESTOSTERONE CYPIONATE) 200 MG/ML injection INJECT 1 ML EVERY 14 DAYS 01/25/19   [name], MD     Critical care time: 35 minutes spent in bedside evaluation chart review discussing case with emergency room caregivers.

## 2019-04-12 NOTE — ED Notes (Signed)
ED TO INPATIENT HANDOFF REPORT  Name/Age/Gender Cameron Daniel 73 y.o. male  Code Status    Code Status Orders  (From admission, onward)         Start     Ordered   04/12/19 2208  Full code  Continuous     04/12/19 2210        Code Status History    This patient has a current code status but no historical code status.   Advance Care Planning Activity    Advance Directive Documentation     Most Recent Value  Type of Advance Directive  Healthcare Power of Attorney  Pre-existing out of facility DNR order (yellow form or pink MOST form)  -  "MOST" Form in Place?  -      Home/SNF/Other Home  Chief Complaint fever,rattle in chest  Level of Care/Admitting Diagnosis ED Disposition    ED Disposition Condition [place]: [place] [[id number]]  Level of Care: ICU [6]  Covid Evaluation: Confirmed COVID Negative  Diagnosis: Acute respiratory failure ([place]) [518.81.ICD-9-CM]  Admitting Physician: Shellia Cleverly [[id number]]  Attending Physician: Shellia Cleverly [[id number]]  Estimated length of stay: 5 - 7 days  Certification:: I certify this patient will need inpatient services for at least 2 midnights  PT Class (Do Not Modify): Inpatient [101]  PT Acc Code (Do Not Modify): Private [1]       Medical History Past Medical History:  Diagnosis Date  . Allergic rhinitis, cause unspecified   . Allergy   . Anxiety   . Arthritis   . Cancer ([place])   . Cataract   . Coronary atherosclerosis of unspecified type of vessel, native or graft    a. Details unclear, someone previously wrote "1985 , arthroplasty x 1."   . Depression    PTSD   . Diverticulosis of colon (without mention of hemorrhage)   . Esophageal reflux   . Esophageal stricture   . Essential hypertension   . Flushing   . Hemorrhoids   . Hyperlipidemia   . Irritable bowel syndrome   . Myocardial infarction ([place])   . Nocturia   . Obesity, unspecified   . Other  acquired absence of organ   . Other malaise and fatigue   . Personal history of colonic polyps   . Psychosexual dysfunction with inhibited sexual excitement   . Type II or unspecified type diabetes mellitus without mention of complication, not stated as uncontrolled     Allergies Allergies  Allergen Reactions  . Doxazosin Mesylate     Reaction=unknown  . Doxazosin Mesylate Other (See Comments)  . Other Other (See Comments)  . Flomax [Tamsulosin Hcl]     Itching   . Jardiance [Empagliflozin] Other (See Comments)    Nausea, dizziness - Severe (per pt)  . Penicillins Rash and Other (See Comments)    Has patient had a PCN reaction causing immediate rash, facial/tongue/throat swelling, SOB or lightheadedness with hypotension: No Has patient had a PCN reaction causing severe rash involving mucus membranes or skin necrosis: No Has patient had a PCN reaction that required hospitalization No Has patient had a PCN reaction occurring within the last 10 years: No If all of the above answers are "NO", then may proceed with Cephalosporin use.   . Sulfonamide Derivatives Rash  . Trulicity [Dulaglutide] Nausea And Vomiting    N&V, Dizziness    IV Location/Drains/Wounds Patient Lines/Drains/Airways Status   Active Line/Drains/Airways  Name:   Placement date:   Placement time:   Site:   Days:   Peripheral IV 04/12/19 Left Antecubital   04/12/19    1740    Antecubital   less than 1   Peripheral IV 04/12/19 Left;Posterior Wrist   04/12/19    2120    Wrist   less than 1   NG/OG Tube Orogastric 16 Fr. Right mouth Measured external length of tube   04/12/19    2120    Right mouth   less than 1   Urethral Catheter Leafy Kindle, RN  Triple-lumen   04/12/19    2153    Triple-lumen   less than 1   Airway 7.5 mm   04/12/19    2050     less than 1   Incision 03/28/12 Abdomen Other (Comment)   03/28/12    1205     2571   Incision - 2 Ports Abdomen 1: Right;Lateral 2: Left;Lateral   03/28/12    -      2571          Labs/Imaging Results for orders placed or performed during the hospital encounter of 04/12/19 (from the past 48 hour(s))  CBC with Differential/Platelet     Status: Abnormal   Collection Time: 04/12/19  4:47 PM  Result Value Ref Range   WBC 14.8 (H) 4.0 - 10.5 K/uL   RBC 5.63 4.22 - 5.81 MIL/uL   Hemoglobin 16.6 13.0 - 17.0 g/dL   HCT 52.2 (H) 39.0 - 52.0 %   MCV 92.7 80.0 - 100.0 fL   MCH 29.5 26.0 - 34.0 pg   MCHC 31.8 30.0 - 36.0 g/dL   RDW 14.5 11.5 - 15.5 %   Platelets 141 (L) 150 - 400 K/uL   nRBC 0.0 0.0 - 0.2 %   Neutrophils Relative % 82 %   Neutro Abs 12.1 (H) 1.7 - 7.7 K/uL   Lymphocytes Relative 9 %   Lymphs Abs 1.3 0.7 - 4.0 K/uL   Monocytes Relative 7 %   Monocytes Absolute 1.0 0.1 - 1.0 K/uL   Eosinophils Relative 0 %   Eosinophils Absolute 0.0 0.0 - 0.5 K/uL   Basophils Relative 0 %   Basophils Absolute 0.1 0.0 - 0.1 K/uL   Immature Granulocytes 2 %   Abs Immature Granulocytes 0.32 (H) 0.00 - 0.07 K/uL    Comment: Performed at [facility], [place] [address], [place], [place] [address]  Comprehensive metabolic panel     Status: Abnormal   Collection Time: 04/12/19  4:47 PM  Result Value Ref Range   Sodium 132 (L) 135 - 145 mmol/L   Potassium 4.1 3.5 - 5.1 mmol/L   Chloride 95 (L) 98 - 111 mmol/L   CO2 23 22 - 32 mmol/L   Glucose, Bld 121 (H) 70 - 99 mg/dL   BUN 16 8 - 23 mg/dL   Creatinine, Ser 1.05 0.61 - 1.24 mg/dL   Calcium 9.9 8.9 - 10.3 mg/dL   Total Protein 6.9 6.5 - 8.1 g/dL   Albumin 3.7 3.5 - 5.0 g/dL   AST 41 15 - 41 U/L   ALT 29 0 - 44 U/L   Alkaline Phosphatase 56 38 - 126 U/L   Total Bilirubin 1.5 (H) 0.3 - 1.2 mg/dL   GFR calc non Af Amer >60 >60 mL/min   GFR calc Af Amer >60 >60 mL/min   Anion gap 14 5 - 15    Comment: Performed at  [facility], [place] [address] [place], [place] [address]  Brain natriuretic peptide     Status: Abnormal   Collection Time: 04/12/19  4:47 PM  Result  Value Ref Range   B Natriuretic Peptide 359.3 (H) 0.0 - 100.0 pg/mL    Comment: Performed at [facility], [place] [address], [place], [place] [address]  Troponin I (High Sensitivity)     Status: Abnormal   Collection Time: 04/12/19  4:47 PM  Result Value Ref Range   Troponin I (High Sensitivity) 136 (HH) <18 ng/L    Comment: CRITICAL RESULT CALLED TO, READ BACK BY AND VERIFIED WITH: YELVERTEN,D RN @1802  ON 04/12/2019 JACKSON,K (NOTE) Elevated high sensitivity troponin I (hsTnI) values and significant  changes across serial measurements may suggest ACS but many other  chronic and acute conditions are known to elevate hsTnI results.  Refer to the Links section for chest pain algorithms and additional  guidance. Performed at [facility], [place] [address], [place], [place] [address]   D-dimer, quantitative (not at [facility])     Status: Abnormal   Collection Time: 04/12/19  4:47 PM  Result Value Ref Range   D-Dimer, Quant 0.70 (H) 0.00 - 0.50 ug/mL-FEU    Comment: (NOTE) At the manufacturer cut-off of 0.50 ug/mL FEU, this assay has been documented to exclude PE with a sensitivity and negative predictive value of 97 to 99%.  At this time, this assay has not been approved by the FDA to exclude DVT/VTE. Results should be correlated with clinical presentation. Performed at [facility], [place] [address], [place], [place] [address]   SARS Coronavirus 2 [facility] order, Performed in [facility] hospital lab) Nasopharyngeal Nasopharyngeal Swab     Status: None   Collection Time: 04/12/19  4:47 PM   Specimen: Nasopharyngeal Swab  Result Value Ref Range   SARS Coronavirus 2 NEGATIVE NEGATIVE    Comment: (NOTE) If result is NEGATIVE SARS-CoV-2 target nucleic acids are NOT DETECTED. The SARS-CoV-2 RNA is generally detectable in upper and lower  respiratory specimens during the acute phase of infection. The lowest  concentration of SARS-CoV-2  viral copies this assay can detect is 250  copies / mL. A negative result does not preclude SARS-CoV-2 infection  and should not be used as the sole basis for treatment or other  patient management decisions.  A negative result may occur with  improper specimen collection / handling, submission of specimen other  than nasopharyngeal swab, presence of viral mutation(s) within the  areas targeted by this assay, and inadequate number of viral copies  (<250 copies / mL). A negative result must be combined with clinical  observations, patient history, and epidemiological information. If result is POSITIVE SARS-CoV-2 target nucleic acids are DETECTED. The SARS-CoV-2 RNA is generally detectable in upper and lower  respiratory specimens dur ing the acute phase of infection.  Positive  results are indicative of active infection with SARS-CoV-2.  Clinical  correlation with patient history and other diagnostic information is  necessary to determine patient infection status.  Positive results do  not rule out bacterial infection or co-infection with other viruses. If result is PRESUMPTIVE POSTIVE SARS-CoV-2 nucleic acids MAY BE PRESENT.   A presumptive positive result was obtained on the submitted specimen  and confirmed on repeat testing.  While 2019 novel coronavirus  (SARS-CoV-2) nucleic acids may be present in the submitted sample  additional confirmatory testing may be necessary for epidemiological  and / or clinical  management purposes  to differentiate between  SARS-CoV-2 and other Sarbecovirus currently known to infect humans.  If clinically indicated additional testing with an alternate test  methodology [phone]) is advised. The SARS-CoV-2 RNA is generally  detectable in upper and lower respiratory sp ecimens during the acute  phase of infection. The expected result is Negative. Fact Sheet for Patients:  StrictlyIdeas.no Fact Sheet for Healthcare  Providers: BankingDealers.co.za This test is not yet approved or cleared by the [place] FDA and has been authorized for detection and/or diagnosis of SARS-CoV-2 by FDA under an Emergency Use Authorization (EUA).  This EUA will remain in effect (meaning this test can be used) for the duration of the COVID-19 declaration under Section 564(b)(1) of the Act, 21 U.S.C. section 360bbb-3(b)(1), unless the authorization is terminated or revoked sooner. Performed at [facility], [place] [address] [place], [place] [address]   Troponin I (High Sensitivity)     Status: Abnormal   Collection Time: 04/12/19  6:26 PM  Result Value Ref Range   Troponin I (High Sensitivity) 150 (HH) <18 ng/L    Comment: CRITICAL VALUE NOTED.  VALUE IS CONSISTENT WITH PREVIOUSLY REPORTED AND CALLED VALUE. (NOTE) Elevated high sensitivity troponin I (hsTnI) values and significant  changes across serial measurements may suggest ACS but many other  chronic and acute conditions are known to elevate hsTnI results.  Refer to the Links section for chest pain algorithms and additional  guidance. Performed at [facility], [place] [address] [place], [place] [address]   Glucose, capillary     Status: Abnormal   Collection Time: 04/12/19  8:43 PM  Result Value Ref Range   Glucose-Capillary 160 (H) 70 - 99 mg/dL   Dg Chest 2 View  Result Date: 04/12/2019 CLINICAL DATA:  73 year old male with history of chills, cough and runny nose. EXAM: CHEST - 2 VIEW COMPARISON:  Chest x-ray 08/25/2016. FINDINGS: Lung volumes are normal. No consolidative airspace disease. No pleural effusions. No pneumothorax. No pulmonary nodule or mass noted. Pulmonary vasculature and the cardiomediastinal silhouette are within normal limits. IMPRESSION: No radiographic evidence of acute cardiopulmonary disease. Electronically Signed   By: Vinnie Langton M.D.   On: 04/12/2019 14:18   Dg Abdomen  1 View  Result Date: 04/12/2019 CLINICAL DATA:  Orogastric tube placement. EXAM: ABDOMEN - 1 VIEW COMPARISON:  03/12/2013 and abdomen and pelvis CT dated 01/22/2016. FINDINGS: Orogastric tube tip in the mid stomach and side hole in the proximal to mid stomach. The visualized bowel gas pattern is unremarkable. Excreted contrast in the left renal collecting system. Lumbar and lower thoracic spine degenerative changes. Extensive bilateral patchy airspace opacity. IMPRESSION: 1. Orogastric tube tip in the mid stomach and side hole in the proximal to mid stomach. 2. Extensive bilateral pneumonia or alveolar edema. Electronically Signed   By: Claudie Revering M.D.   On: 04/12/2019 21:28   Ct Angio Chest Pe W And/or Wo Contrast  Result Date: 04/12/2019 CLINICAL DATA:  Chills, cough, runny nose, elevated D-dimer. EXAM: CT ANGIOGRAPHY CHEST WITH CONTRAST TECHNIQUE: Multidetector CT imaging of the chest was performed using the standard protocol during bolus administration of intravenous contrast. Multiplanar CT image reconstructions and MIPs were obtained to evaluate the vascular anatomy. CONTRAST:  120mL OMNIPAQUE IOHEXOL 350 MG/ML SOLN COMPARISON:  None. FINDINGS: Cardiovascular: Majority of the peripheral segmental and subsegmental pulmonary artery branches cannot be characterized due to extensive patient breathing motion artifact. I cannot exclude central obstructing pulmonary embolism within the main pulmonary  arteries bilaterally. No thoracic aortic aneurysm or evidence of aortic dissection. Heart size is within normal limits. No pericardial effusion. Diffuse coronary artery calcifications. Coronary artery stent? Mediastinum/Nodes: No mass or enlarged lymph nodes seen within the mediastinum. Borderline enlarged lymph nodes are seen within the RIGHT upper paratracheal space, RIGHT hilum and subcarinal space, likely reactive in nature. Esophagus appears normal. Trachea appears normal. Lungs/Pleura: Dense consolidation  within the RIGHT lower lobe, consistent with pneumonia. Lungs otherwise clear. No pleural effusion or pneumothorax. Upper Abdomen: No acute findings. Musculoskeletal: Mild degenerative spondylosis. No acute or suspicious osseous finding. Review of the MIP images confirms the above findings. IMPRESSION: 1. RIGHT lower lobe pneumonia. 2. Majority of the peripheral segmental and subsegmental pulmonary arteries cannot be characterized due to extensive patient breathing motion artifact. No central obstructing pulmonary embolism is seen within the main pulmonary arteries bilaterally. 3. Borderline enlarged lymph nodes within the mediastinum and right hilum, likely reactive in nature. 4. Coronary artery calcifications. Aortic Atherosclerosis (ICD10-I70.0). Electronically Signed   By: Franki Cabot M.D.   On: 04/12/2019 19:55   Dg Chest Portable 1 View  Result Date: 04/12/2019 CLINICAL DATA:  Intubated and orogastric tube placed. EXAM: PORTABLE CHEST 1 VIEW COMPARISON:  04/12/2019. FINDINGS: Interval extensive patchy opacity in both lungs. No pleural fluid. Stable borderline enlarged cardiac silhouette. Endotracheal tube in satisfactory position. Orogastric tube extending into the stomach. Thoracic spine degenerative changes. IMPRESSION: Interval extensive bilateral pneumonia or alveolar edema. Electronically Signed   By: Claudie Revering M.D.   On: 04/12/2019 21:26    Pending Labs Unresulted Labs (From admission, onward)    Start     Ordered   04/19/19 0500  Creatinine, serum  (enoxaparin (LOVENOX)    CrCl >/= 30 ml/min)  Weekly,   R    Comments: while on enoxaparin therapy    04/12/19 2210   04/13/19 0500  Comprehensive metabolic panel  Daily,   R     04/12/19 2210   04/13/19 0500  CBC  Daily,   R     04/12/19 2210   04/12/19 2207  CBC  (enoxaparin (LOVENOX)    CrCl >/= 30 ml/min)  Once,   STAT    Comments: Baseline for enoxaparin therapy IF NOT ALREADY DRAWN.  Notify MD if PLT < 100 K.    04/12/19 2210    04/12/19 2207  Creatinine, serum  (enoxaparin (LOVENOX)    CrCl >/= 30 ml/min)  Once,   STAT    Comments: Baseline for enoxaparin therapy IF NOT ALREADY DRAWN.    04/12/19 2210   04/12/19 2159  Strep pneumoniae urinary antigen  Add-on,   AD     04/12/19 2158   04/12/19 2121  Culture, respiratory (non-expectorated)  Once,   STAT    Question:  Patient immune status  Answer:  Normal   04/12/19 2122   04/12/19 2114  Blood gas, arterial (at [facility])  ONCE - STAT,   STAT     04/12/19 2114   04/12/19 2114  Culture, blood (Routine X 2) w Reflex to ID Panel  BLOOD CULTURE X 2,   STAT     04/12/19 2114   04/12/19 2114  Lactic acid, plasma  ONCE - STAT,   STAT     04/12/19 2114          Vitals/Pain Today's Vitals   04/12/19 2206 04/12/19 2210 04/12/19 2226 04/12/19 2230  BP: (!) 92/59 90/60 94/60  100/64  Pulse: (!) 103 (!) 102 95  96  Resp: (!) 21 (!) 23 (!) 21 (!) 24  Temp: (!) 102.6 F (39.2 C) (!) 102.6 F (39.2 C) (!) 102.2 F (39 C) (!) 101.8 F (38.8 C)  TempSrc:      SpO2: 96% 95% 97% 96%  Height:      PainSc:        Isolation Precautions Airborne and Contact precautions  Medications Medications  propofol (DIPRIVAN) 10 mg/mL bolus/IV push (has no administration in time range)  propofol (DIPRIVAN) 1000 MG/100ML infusion (has no administration in time range)  piperacillin-tazobactam (ZOSYN) IVPB 3.375 g (3.375 g Intravenous New Bag/Given 04/12/19 2237)  vancomycin (VANCOCIN) IVPB 1000 mg/200 mL premix (has no administration in time range)  azithromycin (ZITHROMAX) 500 mg in sodium chloride 0.9 % 250 mL IVPB (has no administration in time range)  enoxaparin (LOVENOX) injection 40 mg (has no administration in time range)  famotidine (PEPCID) IVPB 20 mg premix (has no administration in time range)  0.9 %  sodium chloride infusion (has no administration in time range)  acetaminophen (TYLENOL) tablet 650 mg (has no administration in time range)  ondansetron (ZOFRAN) injection  4 mg (has no administration in time range)  ipratropium-albuterol (DUONEB) 0.5-2.5 (3) MG/3ML nebulizer solution 3 mL (has no administration in time range)  LORazepam (ATIVAN) injection 0.5 mg (0.5 mg Intravenous Given 04/12/19 1901)  iohexol (OMNIPAQUE) 350 MG/ML injection 100 mL (100 mLs Intravenous Contrast Given 04/12/19 1934)  etomidate (AMIDATE) injection (20 mg Intravenous Given 04/12/19 2051)  succinylcholine (ANECTINE) injection (100 mg Intravenous Given 04/12/19 2052)  propofol (DIPRIVAN) 10 mg/mL bolus/IV push (20 mg Intravenous Given 04/12/19 2106)  propofol (DIPRIVAN) 1000 MG/100ML infusion (40 mcg/kg/min  115 kg (Order-Specific) Intravenous Rate/Dose Change 04/12/19 2142)  propofol (DIPRIVAN) 10 mg/mL bolus/IV push (20 mg Intravenous Given 04/12/19 2115)  sodium chloride 0.9 % bolus 1,000 mL (1,000 mLs Intravenous New Bag/Given (Non-Interop) 04/12/19 2217)    Mobility walks

## 2019-04-12 NOTE — ED Provider Notes (Addendum)
[facility] DEPT Provider Note   CSN: [id number] Arrival date & time: 04/12/19  1333    History   Chief Complaint Chief Complaint  Patient presents with  . Nasal Congestion  . Chills  . Cough    HPI Cameron Daniel is a 73 y.o. male.     HPI Patient was seen in emergency department yesterday for suspected dehydration.  Received IV fluids.  States his generalized weakness has improved though he still has chills and fatigue.  Has ongoing nonproductive cough.  States he is having some difficulty breathing and rattling sounds when he does breathe.  Denies any new lower extremity swelling or pain.  No nausea, vomiting or diarrhea.  No known sick contacts. Past Medical History:  Diagnosis Date  . Allergic rhinitis, cause unspecified   . Allergy   . Anxiety   . Arthritis   . Cancer ([place])   . Cataract   . Coronary atherosclerosis of unspecified type of vessel, native or graft    a. Details unclear, someone previously wrote "1985 , arthroplasty x 1."   . Depression    PTSD   . Diverticulosis of colon (without mention of hemorrhage)   . Esophageal reflux   . Esophageal stricture   . Essential hypertension   . Flushing   . Hemorrhoids   . Hyperlipidemia   . Irritable bowel syndrome   . Myocardial infarction ([place])   . Nocturia   . Obesity, unspecified   . Other acquired absence of organ   . Other malaise and fatigue   . Personal history of colonic polyps   . Psychosexual dysfunction with inhibited sexual excitement   . Type II or unspecified type diabetes mellitus without mention of complication, not stated as uncontrolled     Patient Active Problem List   Diagnosis Date Noted  . Pneumonia 04/12/2019  . Allergic conjunctivitis 01/23/2018  . Chronic sinusitis 01/23/2018  . Gout 04/06/2012  . DIVERTICULITIS-COLON 06/18/2010  . FACIAL FLUSHING 12/11/2007  . NOCTURIA 12/11/2007  . DIABETES MELLITUS, TYPE II 09/07/2007  . OTHER MALAISE  AND FATIGUE 09/07/2007  . Allergic rhinitis 08/16/2007  . HYPERLIPIDEMIA 01/24/2007  . OBESITY 01/24/2007  . ERECTILE DYSFUNCTION 01/24/2007  . HYPERTENSION 01/24/2007  . CORONARY ARTERY DISEASE 01/24/2007  . GERD 01/24/2007  . DIVERTICULOSIS, COLON 01/24/2007  . IRRITABLE BOWEL SYNDROME 01/24/2007  . COLONIC POLYPS, HX OF 01/24/2007  . CHOLECYSTECTOMY, HX OF 01/24/2007    Past Surgical History:  Procedure Laterality Date  . ADENOIDECTOMY    . CARPAL TUNNEL RELEASE     left   . CHOLECYSTECTOMY    . COLON RESECTION     18 inches  . COLON SURGERY  2013  . CORONARY ANGIOPLASTY     1985   . ELBOW BURSA SURGERY    . EYE SURGERY    . POLYPECTOMY    . SHOULDER SURGERY     Rt. Shoulder  . TONSILLECTOMY          Home Medications    Prior to Admission medications   Medication Sig Start Date End Date Taking? Authorizing Provider  aspirin 81 MG tablet Take 81 mg by mouth every morning.     [provider]  Cholecalciferol (VITAMIN D3) 1000 units CAPS Take 1,000 Units by mouth daily.    [provider]  citalopram (CELEXA) 10 MG tablet Take 2 tablets (20 mg total) by mouth daily. Patient taking differently: Take 10 mg by mouth daily.  02/27/19  [name], MD  colestipol (COLESTID) 5 g packet Take 5 g by mouth 2 (two) times daily.    [provider]  fexofenadine (ALLEGRA) 180 MG tablet Take 1 tablet (180 mg total) by mouth daily as needed. For allergies Patient taking differently: Take 180 mg by mouth daily as needed for allergies.  05/28/18   Bobbitt, [place] Muta, MD  finasteride (PROSCAR) 5 MG tablet Take 1 tablet (5 mg total) by mouth daily. 02/27/19   [name], MD  fluticasone (FLONASE) 50 MCG/ACT nasal spray Place 2 sprays into both nostrils daily. 10/15/18   [place], [place] Nunnery, MD  glipiZIDE (GLUCOTROL) 5 MG tablet TAKE 1 TABLET (5 MG TOTAL) BY MOUTH DAILY BEFORE BREAKFAST. 01/07/19   [name], MD  Multiple Vitamin  (MULTI-VITAMINS) TABS Take 1 capsule by mouth daily.    [provider]  olmesartan (BENICAR) 20 MG tablet Take 1 tablet (20 mg total) by mouth daily. Patient taking differently: Take 20 mg by mouth at bedtime.  02/19/18   [name], MD  omega-3 acid ethyl esters (LOVAZA) 1 g capsule Take 1 capsule (1 g total) by mouth daily. Patient taking differently: Take 1 g by mouth at bedtime.  11/03/17   Dorothy Spark, MD  pantoprazole (PROTONIX) 40 MG tablet TAKE 1 TABLET (40 MG TOTAL) BY MOUTH EVERY MORNING. Patient taking differently: Take 40 mg by mouth daily as needed (heartburn).  10/04/18   [name], MD  pioglitazone (ACTOS) 30 MG tablet Take 1 tablet (30 mg total) by mouth daily. 03/12/19   [name], MD  rosuvastatin (CRESTOR) 10 MG tablet Take 1 tablet (10 mg total) by mouth daily. 11/26/18   [name], MD  testosterone cypionate (DEPOTESTOSTERONE CYPIONATE) 200 MG/ML injection INJECT 1 ML EVERY 14 DAYS 01/25/19   [name], MD    Family History Family History  Problem Relation Age of Onset  . Lung cancer Father   . Allergic rhinitis Father   . Coronary artery disease Mother   . Colon cancer Neg Hx   . Heart attack Neg Hx   . Hypertension Neg Hx   . Stroke Neg Hx   . Esophageal cancer Neg Hx   . Rectal cancer Neg Hx   . Stomach cancer Neg Hx   . Pancreatic cancer Neg Hx     Social History Social History   Tobacco Use  . Smoking status: Former Smoker    Quit date: 08/30/1983    Years since quitting: 35.6  . Smokeless tobacco: Former Systems developer  . Tobacco comment: Quit 30 years ago.  Substance Use Topics  . Alcohol use: Yes    Comment: rare  . Drug use: No     Allergies   Doxazosin mesylate, Doxazosin mesylate, Other, Flomax [tamsulosin hcl], Jardiance [empagliflozin], Penicillins, Sulfonamide derivatives, and Trulicity [dulaglutide]   Review of Systems Review of Systems  Constitutional: Positive for chills and fatigue. Negative  for fever.  HENT: Positive for congestion. Negative for sore throat and trouble swallowing.   Respiratory: Positive for cough, shortness of breath and wheezing.   Cardiovascular: Negative for chest pain, palpitations and leg swelling.  Gastrointestinal: Negative for abdominal pain, constipation, diarrhea, nausea and vomiting.  Genitourinary: Negative for dysuria, flank pain and frequency.  Musculoskeletal: Negative for back pain, myalgias, neck pain and neck stiffness.  Skin: Negative for rash and wound.  Neurological: Negative for dizziness, weakness, light-headedness, numbness and headaches.  All other systems reviewed and  are negative.    Physical Exam Updated Vital Signs BP (!) 148/83   Pulse (!) 114   Temp 98.7 F (37.1 C) (Oral)   Resp 15   SpO2 96%   Physical Exam Vitals signs and nursing note reviewed.  Constitutional:      General: He is not in acute distress.    Appearance: Normal appearance. He is well-developed. He is not ill-appearing.  HENT:     Head: Normocephalic and atraumatic.     Comments: No sinus tenderness with percussion.    Nose: Congestion present.     Mouth/Throat:     Mouth: Mucous membranes are moist.     Pharynx: No oropharyngeal exudate or posterior oropharyngeal erythema.  Eyes:     Pupils: Pupils are equal, round, and reactive to light.  Neck:     Musculoskeletal: Normal range of motion and neck supple. No neck rigidity or muscular tenderness.  Cardiovascular:     Rate and Rhythm: Regular rhythm. Tachycardia present.     Heart sounds: No murmur. No friction rub. No gallop.   Pulmonary:     Effort: Pulmonary effort is normal.     Breath sounds: Rhonchi present.     Comments: Scattered basilar rhonchi Abdominal:     General: Bowel sounds are normal.     Palpations: Abdomen is soft.     Tenderness: There is no abdominal tenderness. There is no guarding or rebound.     Hernia: A hernia is present.     Comments: Ventral hernia easily  reduced.  Abdomen is nontender.  Musculoskeletal: Normal range of motion.        General: No swelling, tenderness, deformity or signs of injury.     Right lower leg: No edema.     Left lower leg: No edema.  Lymphadenopathy:     Cervical: No cervical adenopathy.  Skin:    General: Skin is warm and dry.     Capillary Refill: Capillary refill takes less than 2 seconds.     Findings: No erythema or rash.  Neurological:     General: No focal deficit present.     Mental Status: He is alert and oriented to person, place, and time.  Psychiatric:        Behavior: Behavior normal.      ED Treatments / Results  Labs (all labs ordered are listed, but only abnormal results are displayed) Labs Reviewed  CBC WITH DIFFERENTIAL/PLATELET - Abnormal; Notable for the following components:      Result Value   WBC 14.8 (*)    HCT 52.2 (*)    Platelets 141 (*)    Neutro Abs 12.1 (*)    Abs Immature Granulocytes 0.32 (*)    All other components within normal limits  COMPREHENSIVE METABOLIC PANEL - Abnormal; Notable for the following components:   Sodium 132 (*)    Chloride 95 (*)    Glucose, Bld 121 (*)    Total Bilirubin 1.5 (*)    All other components within normal limits  BRAIN NATRIURETIC PEPTIDE - Abnormal; Notable for the following components:   B Natriuretic Peptide 359.3 (*)    All other components within normal limits  D-DIMER, QUANTITATIVE (NOT AT [facility]) - Abnormal; Notable for the following components:   D-Dimer, Quant 0.70 (*)    All other components within normal limits  TROPONIN I (HIGH SENSITIVITY) - Abnormal; Notable for the following components:   Troponin I (High Sensitivity) 136 (*)    All other components  within normal limits  SARS CORONAVIRUS 2 (HOSPITAL ORDER, [facility])  TROPONIN I (HIGH SENSITIVITY)    EKG None  Radiology Dg Chest 2 View  Result Date: 04/12/2019 CLINICAL DATA:  73 year old male with history of chills, cough and runny  nose. EXAM: CHEST - 2 VIEW COMPARISON:  Chest x-ray 08/25/2016. FINDINGS: Lung volumes are normal. No consolidative airspace disease. No pleural effusions. No pneumothorax. No pulmonary nodule or mass noted. Pulmonary vasculature and the cardiomediastinal silhouette are within normal limits. IMPRESSION: No radiographic evidence of acute cardiopulmonary disease. Electronically Signed   By: Vinnie Langton M.D.   On: 04/12/2019 14:18   Ct Angio Chest Pe W And/or Wo Contrast  Result Date: 04/12/2019 CLINICAL DATA:  Chills, cough, runny nose, elevated D-dimer. EXAM: CT ANGIOGRAPHY CHEST WITH CONTRAST TECHNIQUE: Multidetector CT imaging of the chest was performed using the standard protocol during bolus administration of intravenous contrast. Multiplanar CT image reconstructions and MIPs were obtained to evaluate the vascular anatomy. CONTRAST:  131mL OMNIPAQUE IOHEXOL 350 MG/ML SOLN COMPARISON:  None. FINDINGS: Cardiovascular: Majority of the peripheral segmental and subsegmental pulmonary artery branches cannot be characterized due to extensive patient breathing motion artifact. I cannot exclude central obstructing pulmonary embolism within the main pulmonary arteries bilaterally. No thoracic aortic aneurysm or evidence of aortic dissection. Heart size is within normal limits. No pericardial effusion. Diffuse coronary artery calcifications. Coronary artery stent? Mediastinum/Nodes: No mass or enlarged lymph nodes seen within the mediastinum. Borderline enlarged lymph nodes are seen within the RIGHT upper paratracheal space, RIGHT hilum and subcarinal space, likely reactive in nature. Esophagus appears normal. Trachea appears normal. Lungs/Pleura: Dense consolidation within the RIGHT lower lobe, consistent with pneumonia. Lungs otherwise clear. No pleural effusion or pneumothorax. Upper Abdomen: No acute findings. Musculoskeletal: Mild degenerative spondylosis. No acute or suspicious osseous finding. Review of the  MIP images confirms the above findings. IMPRESSION: 1. RIGHT lower lobe pneumonia. 2. Majority of the peripheral segmental and subsegmental pulmonary arteries cannot be characterized due to extensive patient breathing motion artifact. No central obstructing pulmonary embolism is seen within the main pulmonary arteries bilaterally. 3. Borderline enlarged lymph nodes within the mediastinum and right hilum, likely reactive in nature. 4. Coronary artery calcifications. Aortic Atherosclerosis (ICD10-I70.0). Electronically Signed   By: Franki Cabot M.D.   On: 04/12/2019 19:55    Procedures Procedure Name: Intubation Date/Time: 04/12/2019 9:45 PM Performed by: Julianne Rice, MD Pre-anesthesia Checklist: Patient identified, Patient being monitored, Emergency Drugs available, Timeout performed and Suction available Oxygen Delivery Method: Non-rebreather mask Preoxygenation: Pre-oxygenation with 100% oxygen Induction Type: Rapid sequence Ventilation: Mask ventilation without difficulty Laryngoscope Size: Glidescope and 4 Tube size: 7.5 mm Number of attempts: 1 Placement Confirmation: ETT inserted through vocal cords under direct vision,  CO2 detector and Breath sounds checked- equal and bilateral Secured at: 24 cm Tube secured with: ETT holder      (including critical care time)  Medications Ordered in ED Medications  levofloxacin (LEVAQUIN) IVPB 750 mg (has no administration in time range)  LORazepam (ATIVAN) injection 0.5 mg (0.5 mg Intravenous Given 04/12/19 1901)  iohexol (OMNIPAQUE) 350 MG/ML injection 100 mL (100 mLs Intravenous Contrast Given 04/12/19 1934)   CRITICAL CARE Performed by: Julianne Rice Total critical care time: 40 minutes Critical care time was exclusive of separately billable procedures and treating other patients. Critical care was necessary to treat or prevent imminent or life-threatening deterioration. Critical care was time spent personally by me on the  following activities: development of  treatment plan with patient and/or surrogate as well as nursing, discussions with consultants, evaluation of patient's response to treatment, examination of patient, obtaining history from patient or surrogate, ordering and performing treatments and interventions, ordering and review of laboratory studies, ordering and review of radiographic studies, pulse oximetry and re-evaluation of patient's condition.  Initial Impression / Assessment and Plan / ED Course  I have reviewed the triage vital signs and the nursing notes.  Pertinent labs & imaging results that were available during my care of the patient were reviewed by me and considered in my medical decision making (see chart for details).        Patient became anxious in the emergency department which he attributes to his PTSD.  Given low-dose Ativan.  Mild elevation in his d-dimer.  Patient also has elevation in his troponin.  CT angios chest with evidence of right lower lobe pneumonia.  Continues to deny any chest pain.  Suspect elevated troponin is due to his underlying pulmonary infection.  Initiated IV antibiotics.  Discussed with hospitalist will see patient emergency department and admit.  Final Clinical Impressions(s) / ED Diagnoses   Final diagnoses:  Community acquired pneumonia of right lower lobe of lung ([place])  Elevated troponin    ED Discharge Orders    None       Julianne Rice, MD 04/12/19 2031  After CT angio of the chest patient became increasingly tachypneic, diaphoretic and pale.  Oxygen saturations dropped into the low 80s.  Only minimal improvement with nonrebreather.  Patient continues to deny any chest pain but states he is having increasing trouble breathing.  Decision made to intubate patient based on his persistent hypoxia.  Intubated in the emergency department.  Repeat chest x-ray with bilateral infiltrates concerning for ARDS.  Repeat troponin with only mild elevation.   Discussed with critical care who will see patient in the emergency department and admit.  Blood cultures drawn and patient will be started on broad-spectrum antibiotics.   Julianne Rice, MD 04/12/19 2148   Updated patient's wife, Kathreen Cosier [phone]).   Julianne Rice, MD 04/12/19 2157

## 2019-04-13 ENCOUNTER — Other Ambulatory Visit: Payer: Self-pay

## 2019-04-13 ENCOUNTER — Inpatient Hospital Stay (HOSPITAL_COMMUNITY): Payer: Medicare HMO

## 2019-04-13 DIAGNOSIS — I5021 Acute systolic (congestive) heart failure: Secondary | ICD-10-CM

## 2019-04-13 DIAGNOSIS — I340 Nonrheumatic mitral (valve) insufficiency: Secondary | ICD-10-CM

## 2019-04-13 DIAGNOSIS — I2510 Atherosclerotic heart disease of native coronary artery without angina pectoris: Secondary | ICD-10-CM

## 2019-04-13 DIAGNOSIS — J810 Acute pulmonary edema: Secondary | ICD-10-CM

## 2019-04-13 DIAGNOSIS — J9601 Acute respiratory failure with hypoxia: Secondary | ICD-10-CM

## 2019-04-13 DIAGNOSIS — J181 Lobar pneumonia, unspecified organism: Secondary | ICD-10-CM

## 2019-04-13 LAB — CBC
HCT: 51.5 % (ref 39.0–52.0)
HCT: 54.1 % — ABNORMAL HIGH (ref 39.0–52.0)
Hemoglobin: 16.6 g/dL (ref 13.0–17.0)
Hemoglobin: 17.2 g/dL — ABNORMAL HIGH (ref 13.0–17.0)
MCH: 29.8 pg (ref 26.0–34.0)
MCH: 29.9 pg (ref 26.0–34.0)
MCHC: 31.8 g/dL (ref 30.0–36.0)
MCHC: 32.2 g/dL (ref 30.0–36.0)
MCV: 92.8 fL (ref 80.0–100.0)
MCV: 93.8 fL (ref 80.0–100.0)
Platelets: 132 10*3/uL — ABNORMAL LOW (ref 150–400)
Platelets: 176 10*3/uL (ref 150–400)
RBC: 5.55 MIL/uL (ref 4.22–5.81)
RBC: 5.77 MIL/uL (ref 4.22–5.81)
RDW: 14.6 % (ref 11.5–15.5)
RDW: 14.6 % (ref 11.5–15.5)
WBC: 16.1 10*3/uL — ABNORMAL HIGH (ref 4.0–10.5)
WBC: 16.8 10*3/uL — ABNORMAL HIGH (ref 4.0–10.5)
nRBC: 0 % (ref 0.0–0.2)
nRBC: 0 % (ref 0.0–0.2)

## 2019-04-13 LAB — COMPREHENSIVE METABOLIC PANEL
ALT: 32 U/L (ref 0–44)
AST: 64 U/L — ABNORMAL HIGH (ref 15–41)
Albumin: 3.3 g/dL — ABNORMAL LOW (ref 3.5–5.0)
Alkaline Phosphatase: 57 U/L (ref 38–126)
Anion gap: 16 — ABNORMAL HIGH (ref 5–15)
BUN: 19 mg/dL (ref 8–23)
CO2: 20 mmol/L — ABNORMAL LOW (ref 22–32)
Calcium: 9.2 mg/dL (ref 8.9–10.3)
Chloride: 95 mmol/L — ABNORMAL LOW (ref 98–111)
Creatinine, Ser: 1.51 mg/dL — ABNORMAL HIGH (ref 0.61–1.24)
GFR calc Af Amer: 53 mL/min — ABNORMAL LOW (ref >60)
GFR calc non Af Amer: 45 mL/min — ABNORMAL LOW (ref >60)
Glucose, Bld: 220 mg/dL — ABNORMAL HIGH (ref 70–99)
Potassium: 4.2 mmol/L (ref 3.5–5.1)
Sodium: 131 mmol/L — ABNORMAL LOW (ref 135–145)
Total Bilirubin: 2.1 mg/dL — ABNORMAL HIGH (ref 0.3–1.2)
Total Protein: 6.8 g/dL (ref 6.5–8.1)

## 2019-04-13 LAB — GLUCOSE, CAPILLARY
Glucose-Capillary: 224 mg/dL — ABNORMAL HIGH (ref 70–99)
Glucose-Capillary: 231 mg/dL — ABNORMAL HIGH (ref 70–99)

## 2019-04-13 LAB — APTT: aPTT: 150 seconds — ABNORMAL HIGH (ref 24–36)

## 2019-04-13 LAB — PROTIME-INR
INR: 1.4 — ABNORMAL HIGH (ref 0.8–1.2)
Prothrombin Time: 16.9 s — ABNORMAL HIGH (ref 11.4–15.2)

## 2019-04-13 LAB — ECHOCARDIOGRAM COMPLETE
Height: 71 in
Weight: 4257.52 [oz_av]

## 2019-04-13 LAB — LACTIC ACID, PLASMA: Lactic Acid, Venous: 4.6 mmol/L (ref 0.5–1.9)

## 2019-04-13 LAB — HEPARIN LEVEL (UNFRACTIONATED): Heparin Unfractionated: 0.18 [IU]/mL — ABNORMAL LOW (ref 0.30–0.70)

## 2019-04-13 LAB — TROPONIN I (HIGH SENSITIVITY): Troponin I (High Sensitivity): 1963 ng/L

## 2019-04-13 LAB — MRSA PCR SCREENING: MRSA by PCR: NEGATIVE

## 2019-04-13 LAB — CREATININE, SERUM
Creatinine, Ser: 1.41 mg/dL — ABNORMAL HIGH (ref 0.61–1.24)
GFR calc Af Amer: 57 mL/min — ABNORMAL LOW (ref >60)
GFR calc non Af Amer: 49 mL/min — ABNORMAL LOW (ref >60)

## 2019-04-13 MED ORDER — ENOXAPARIN SODIUM 80 MG/0.8ML ~~LOC~~ SOLN: 70.0000 mg | Freq: Two times a day (BID) | SUBCUTANEOUS | Status: DC

## 2019-04-13 MED ORDER — ALBUTEROL SULFATE (2.5 MG/3ML) 0.083% IN NEBU: 2.5000 mg | INHALATION_SOLUTION | Freq: Four times a day (QID) | RESPIRATORY_TRACT | Status: DC | PRN

## 2019-04-13 MED ORDER — INSULIN ASPART 100 UNIT/ML ~~LOC~~ SOLN
1.0000 [IU] | SUBCUTANEOUS | Status: DC
  Administered 2019-04-14: 3 [IU] via SUBCUTANEOUS
  Administered 2019-04-14 (×2): 2 [IU] via SUBCUTANEOUS

## 2019-04-13 MED ORDER — HEPARIN BOLUS VIA INFUSION
4000.0000 [IU] | Freq: Once | INTRAVENOUS | Status: AC
  Administered 2019-04-13: 4000 [IU] via INTRAVENOUS
  Filled 2019-04-13: qty 4000

## 2019-04-13 MED ORDER — ACETAMINOPHEN 325 MG PO TABS
650.0000 mg | ORAL_TABLET | Freq: Four times a day (QID) | ORAL | Status: DC | PRN
  Administered 2019-04-13 – 2019-04-15 (×6): 650 mg via ORAL
  Filled 2019-04-13 (×6): qty 2

## 2019-04-13 MED ORDER — ACETAMINOPHEN 650 MG RE SUPP
650.0000 mg | Freq: Four times a day (QID) | RECTAL | Status: DC | PRN
  Administered 2019-04-13: 650 mg via RECTAL
  Filled 2019-04-13: qty 1

## 2019-04-13 MED ORDER — ENOXAPARIN SODIUM 80 MG/0.8ML ~~LOC~~ SOLN: 80.0000 mg | Freq: Two times a day (BID) | SUBCUTANEOUS | Status: DC

## 2019-04-13 MED ORDER — HEPARIN (PORCINE) 25000 UT/250ML-% IV SOLN
2000.0000 [IU]/h | INTRAVENOUS | Status: DC
  Administered 2019-04-13: 1600 [IU]/h via INTRAVENOUS
  Administered 2019-04-13: 1300 [IU]/h via INTRAVENOUS
  Administered 2019-04-14: 1600 [IU]/h via INTRAVENOUS
  Administered 2019-04-15: 2000 [IU]/h via INTRAVENOUS
  Administered 2019-04-15: 1600 [IU]/h via INTRAVENOUS
  Administered 2019-04-16: 2000 [IU]/h via INTRAVENOUS
  Filled 2019-04-13 (×6): qty 250

## 2019-04-13 MED ORDER — MORPHINE SULFATE (PF) 2 MG/ML IV SOLN
2.0000 mg | Freq: Once | INTRAVENOUS | Status: AC
  Administered 2019-04-13: 2 mg via INTRAVENOUS

## 2019-04-13 MED ORDER — FUROSEMIDE 10 MG/ML IJ SOLN
40.0000 mg | INTRAMUSCULAR | Status: AC
  Administered 2019-04-13: 40 mg via INTRAVENOUS
  Filled 2019-04-13: qty 4

## 2019-04-13 MED ORDER — ENOXAPARIN SODIUM 40 MG/0.4ML ~~LOC~~ SOLN: 40.0000 mg | Freq: Once | SUBCUTANEOUS | Status: DC

## 2019-04-13 MED ORDER — FUROSEMIDE 10 MG/ML IJ SOLN
40.0000 mg | Freq: Once | INTRAMUSCULAR | Status: AC
  Administered 2019-04-13: 40 mg via INTRAVENOUS
  Filled 2019-04-13: qty 4

## 2019-04-13 MED ORDER — ENOXAPARIN SODIUM 30 MG/0.3ML ~~LOC~~ SOLN: 30.0000 mg | Freq: Once | SUBCUTANEOUS | Status: DC

## 2019-04-13 MED ORDER — ORAL CARE MOUTH RINSE
15.0000 mL | OROMUCOSAL | Status: DC
  Administered 2019-04-13: 15 mL via OROMUCOSAL

## 2019-04-13 MED ORDER — ORAL CARE MOUTH RINSE
15.0000 mL | Freq: Two times a day (BID) | OROMUCOSAL | Status: DC
  Administered 2019-04-14 – 2019-04-25 (×18): 15 mL via OROMUCOSAL

## 2019-04-13 MED ORDER — NITROGLYCERIN IN D5W 200-5 MCG/ML-% IV SOLN
0.0000 ug/min | INTRAVENOUS | Status: DC
  Administered 2019-04-13: 5 ug/min via INTRAVENOUS
  Administered 2019-04-14: 10 ug/min via INTRAVENOUS
  Filled 2019-04-13: qty 250

## 2019-04-13 MED ORDER — MORPHINE SULFATE (PF) 2 MG/ML IV SOLN
INTRAVENOUS | Status: AC
  Administered 2019-04-13: 2 mg via INTRAVENOUS
  Filled 2019-04-13: qty 1

## 2019-04-13 MED ORDER — CHLORHEXIDINE GLUCONATE 0.12% ORAL RINSE (MEDLINE KIT)
15.0000 mL | Freq: Two times a day (BID) | OROMUCOSAL | Status: DC
  Administered 2019-04-13: 15 mL via OROMUCOSAL

## 2019-04-13 MED ORDER — CHLORHEXIDINE GLUCONATE CLOTH 2 % EX PADS
6.0000 | MEDICATED_PAD | Freq: Every day | CUTANEOUS | Status: DC
  Administered 2019-04-13 – 2019-04-24 (×9): 6 via TOPICAL

## 2019-04-13 MED ORDER — ASPIRIN 300 MG RE SUPP
300.0000 mg | Freq: Once | RECTAL | Status: AC
  Administered 2019-04-13: 13:00:00 300 mg via RECTAL
  Filled 2019-04-13: qty 1

## 2019-04-13 MED ORDER — METOPROLOL TARTRATE 5 MG/5ML IV SOLN
2.5000 mg | Freq: Once | INTRAVENOUS | Status: AC | PRN
  Administered 2019-04-13: 11:00:00 5 mg via INTRAVENOUS
  Filled 2019-04-13: qty 5

## 2019-04-13 MED ORDER — HEPARIN BOLUS VIA INFUSION
3000.0000 [IU] | Freq: Once | INTRAVENOUS | Status: AC
  Administered 2019-04-13: 3000 [IU] via INTRAVENOUS
  Filled 2019-04-13: qty 3000

## 2019-04-13 MED ORDER — PERFLUTREN LIPID MICROSPHERE
1.0000 mL | INTRAVENOUS | Status: AC | PRN
  Administered 2019-04-13: 12:00:00 3 mL via INTRAVENOUS
  Filled 2019-04-13: qty 10

## 2019-04-13 MED ORDER — CHLORHEXIDINE GLUCONATE CLOTH 2 % EX PADS
6.0000 | MEDICATED_PAD | Freq: Every day | CUTANEOUS | Status: DC
  Administered 2019-04-13: 6 via TOPICAL

## 2019-04-13 MED ORDER — IPRATROPIUM-ALBUTEROL 0.5-2.5 (3) MG/3ML IN SOLN
3.0000 mL | Freq: Three times a day (TID) | RESPIRATORY_TRACT | Status: DC
  Administered 2019-04-14: 3 mL via RESPIRATORY_TRACT
  Filled 2019-04-13: qty 3

## 2019-04-13 NOTE — Progress Notes (Signed)
Attempted echo.  HR elevated.  Will perform once HR normalizes

## 2019-04-13 NOTE — Progress Notes (Addendum)
RT was called by RN to assist with pt who was desating in low 80s with an increase in WOB. Pt placed on HFNC at 10 liters with SATs maintaining in low 90s. HR 124, RR 30-35. MD notified.

## 2019-04-13 NOTE — Progress Notes (Signed)
ANTICOAGULATION CONSULT NOTE - Initial Consult  Pharmacy Consult for heparin Indication: chest pain/ACS  Allergies  Allergen Reactions  . Doxazosin Mesylate     Reaction=unknown  . Doxazosin Mesylate Other (See Comments)  . Other Other (See Comments)  . Flomax [Tamsulosin Hcl]     Itching   . Jardiance [Empagliflozin] Other (See Comments)    Nausea, dizziness - Severe (per pt)  . Penicillins Rash and Other (See Comments)    Has patient had a PCN reaction causing immediate rash, facial/tongue/throat swelling, SOB or lightheadedness with hypotension: No Has patient had a PCN reaction causing severe rash involving mucus membranes or skin necrosis: No Has patient had a PCN reaction that required hospitalization No Has patient had a PCN reaction occurring within the last 10 years: No If all of the above answers are "NO", then may proceed with Cephalosporin use.   . Sulfonamide Derivatives Rash  . Trulicity [Dulaglutide] Nausea And Vomiting    N&V, Dizziness    Patient Measurements: Height: 5\' 11"  (180.3 cm) Weight: 266 lb 1.5 oz (120.7 kg) IBW/kg (Calculated) : 75.3 Heparin Dosing Weight: 102 kg  Vital Signs: Temp: 102.7 F (39.3 C) (08/15 1645) Temp Source: Bladder (08/15 1645) BP: 144/66 (08/15 1615) Pulse Rate: 107 (08/15 1815)  Labs: Recent Labs    04/11/19 1846 04/12/19 1647 04/12/19 1826 04/12/19 2352 04/13/19 0142 04/13/19 0731 04/13/19 1709  HGB  --  16.6  --  16.6 17.2*  --   --   HCT  --  52.2*  --  51.5 54.1*  --   --   PLT  --  141*  --  132* 176  --   --   APTT  --   --   --   --   --  150*  --   LABPROT  --   --   --   --   --  16.9*  --   INR  --   --   --   --   --  1.4*  --   HEPARINUNFRC  --   --   --   --   --   --  0.18*  CREATININE  --  1.05  --  1.41* 1.51*  --   --   CKTOTAL 357  --   --   --   --   --   --   TROPONINIHS  --  136* 150* 1,963*  --   --   --     Estimated Creatinine Clearance: 58.5 mL/min (A) (by C-G formula based on SCr  of 1.51 mg/dL (H)).   Medical History: Past Medical History:  Diagnosis Date  . Allergic rhinitis, cause unspecified   . Allergy   . Anxiety   . Arthritis   . Cancer ([place])   . Cataract   . Coronary atherosclerosis of unspecified type of vessel, native or graft    a. Details unclear, someone previously wrote "1985 , arthroplasty x 1."   . Depression    PTSD   . Diverticulosis of colon (without mention of hemorrhage)   . Esophageal reflux   . Esophageal stricture   . Essential hypertension   . Flushing   . Hemorrhoids   . Hyperlipidemia   . Irritable bowel syndrome   . Myocardial infarction ([place])   . Nocturia   . Obesity, unspecified   . Other acquired absence of organ   . Other malaise and fatigue   . Personal history of colonic  polyps   . Psychosexual dysfunction with inhibited sexual excitement   . Type II or unspecified type diabetes mellitus without mention of complication, not stated as uncontrolled     Assessment: Pharmacy consulted to dose and monitor heparin infusion in this 73 year old male for NSTEMI. Patient was not on anticoagulants PTA. Received a dose of enoxaparin 40 mg on 8/14 @ 2332.   Pt transferred to [facility] for urgent cath. Heparin drip was kept running at 1300 units/hr during transport. Plans for cath are still TBD.  Heparin level came back SUBtherapeutic this evening at 0.18.   Today, 04/13/19  Hgb 17.2 - WNL  Plt 176 - WNL  No signs of bleeding noted by nursing  Goal of Therapy:  Heparin level 0.3-0.7 units/ml Monitor platelets by anticoagulation protocol: Yes   Plan:  Give 3000 units bolus x 1 Increase heparin drip to 1600 units/hr Check anti-Xa level in 8 hours and daily while on heparin Continue to monitor H&H and platelets   Kennon Holter, PharmD PGY1 Ambulatory Care Pharmacy Resident [employer] Phone: [phone] 04/13/2019,6:26 PM

## 2019-04-13 NOTE — Consult Note (Signed)
Cardiology Consultation:   Patient ID: Cameron Daniel MRN: [id number]; DOB: [birth year]  Admit date: 04/12/2019 Date of Consult: 04/13/2019  Primary Care Provider: [name], MD Primary Cardiologist: [name], MD  Primary Electrophysiologist:  None    Patient Profile:   Cameron Daniel is a 73 y.o. male with a hx of CAD, s/p remote inferior MI who is being seen today for the evaluation of acute systolic heart failure at the request of Dr. Elsworth Soho.  History of Present Illness:   Cameron Daniel presented over 30 years ago and underwent stenting of the RCA. He has done well for years. He saw Melina Copa a month ago and was doing well. He worked until Wednesday when he began to feel bad. He thinks he was dehydrated and also developed a fever. He has subsequently been admitted for additional evaluation. He has not had syncope and he denies any anginal symptoms. He feels sob. He has had a productive cough. He developed oxygen desaturation and was placed on bipap. No edema.  Heart Pathway Score:     Past Medical History:  Diagnosis Date   Allergic rhinitis, cause unspecified    Allergy    Anxiety    Arthritis    Cancer ([place])    Cataract    Coronary atherosclerosis of unspecified type of vessel, native or graft    a. Details unclear, someone previously wrote "1985 , arthroplasty x 1."    Depression    PTSD    Diverticulosis of colon (without mention of hemorrhage)    Esophageal reflux    Esophageal stricture    Essential hypertension    Flushing    Hemorrhoids    Hyperlipidemia    Irritable bowel syndrome    Myocardial infarction (HCC)    Nocturia    Obesity, unspecified    Other acquired absence of organ    Other malaise and fatigue    Personal history of colonic polyps    Psychosexual dysfunction with inhibited sexual excitement    Type II or unspecified type diabetes mellitus without mention of complication, not stated as uncontrolled      Past Surgical History:  Procedure Laterality Date   ADENOIDECTOMY     CARPAL TUNNEL RELEASE     left    CHOLECYSTECTOMY     COLON RESECTION     18 inches   COLON SURGERY  2013   CORONARY ANGIOPLASTY     1985    ELBOW [place]. Shoulder   TONSILLECTOMY       Home Medications:  Prior to Admission medications   Medication Sig Start Date End Date Taking? Authorizing Provider  aspirin 81 MG tablet Take 81 mg by mouth every morning.    Yes [provider]  Cholecalciferol (VITAMIN D3) 1000 units CAPS Take 1,000 Units by mouth daily.   Yes [provider]  citalopram (CELEXA) 10 MG tablet Take 2 tablets (20 mg total) by mouth daily. Patient taking differently: Take 10 mg by mouth daily.  02/27/19  Yes [name], MD  colestipol (COLESTID) 5 g packet Take 5 g by mouth 2 (two) times daily.   Yes [provider]  fexofenadine (ALLEGRA) 180 MG tablet Take 1 tablet (180 mg total) by mouth daily as needed. For allergies Patient taking differently: Take 180 mg by mouth daily as needed for allergies.  05/28/18  Yes Bobbitt, Sedalia Muta, MD  finasteride (PROSCAR) 5 MG tablet Take 1 tablet (5 mg total) by mouth daily. 02/27/19  Yes [name], MD  fluticasone (FLONASE) 50 MCG/ACT nasal spray Place 2 sprays into both nostrils daily. 10/15/18  Yes [place], Modena Nunnery, MD  glipiZIDE (GLUCOTROL) 5 MG tablet TAKE 1 TABLET (5 MG TOTAL) BY MOUTH DAILY BEFORE BREAKFAST. 01/07/19  Yes [name], MD  Multiple Vitamin (MULTI-VITAMINS) TABS Take 1 capsule by mouth daily.   Yes [provider]  olmesartan (BENICAR) 20 MG tablet Take 1 tablet (20 mg total) by mouth daily. Patient taking differently: Take 20 mg by mouth at bedtime.  02/19/18  Yes [name], MD  omega-3 acid ethyl esters (LOVAZA) 1 g capsule Take 1 capsule (1 g total) by mouth daily. Patient taking  differently: Take 1 g by mouth at bedtime.  11/03/17  Yes Dorothy Spark, MD  pantoprazole (PROTONIX) 40 MG tablet TAKE 1 TABLET (40 MG TOTAL) BY MOUTH EVERY MORNING. Patient taking differently: Take 40 mg by mouth daily as needed (heartburn).  10/04/18  Yes [name], MD  pioglitazone (ACTOS) 30 MG tablet Take 1 tablet (30 mg total) by mouth daily. 03/12/19  Yes [name], MD  rosuvastatin (CRESTOR) 10 MG tablet Take 1 tablet (10 mg total) by mouth daily. 11/26/18  Yes [name], MD  testosterone cypionate (DEPOTESTOSTERONE CYPIONATE) 200 MG/ML injection INJECT 1 ML EVERY 14 DAYS 01/25/19   [name], MD    Inpatient Medications: Scheduled Meds:  Chlorhexidine Gluconate Cloth  6 each Topical Daily   ipratropium-albuterol  3 mL Nebulization Q6H   mouth rinse  15 mL Mouth Rinse BID   Continuous Infusions:  sodium chloride Stopped (04/12/19 2358)   azithromycin Stopped (04/13/19 0108)   cefTRIAXone (ROCEPHIN)  IV Stopped (04/13/19 0228)   famotidine (PEPCID) IV Stopped (04/13/19 1018)   heparin 1,300 Units/hr (04/13/19 0900)   nitroGLYCERIN 5 mcg/min (04/13/19 1221)   PRN Meds: acetaminophen **OR** acetaminophen, ondansetron (ZOFRAN) IV, perflutren lipid microspheres (DEFINITY) IV suspension  Allergies:    Allergies  Allergen Reactions   Doxazosin Mesylate     Reaction=unknown   Doxazosin Mesylate Other (See Comments)   Other Other (See Comments)   Flomax [Tamsulosin Hcl]     Itching    Jardiance [Empagliflozin] Other (See Comments)    Nausea, dizziness - Severe (per pt)   Penicillins Rash and Other (See Comments)    Has patient had a PCN reaction causing immediate rash, facial/tongue/throat swelling, SOB or lightheadedness with hypotension: No Has patient had a PCN reaction causing severe rash involving mucus membranes or skin necrosis: No Has patient had a PCN reaction that required hospitalization No Has patient had a PCN reaction  occurring within the last 10 years: No If all of the above answers are "NO", then may proceed with Cephalosporin use.    Sulfonamide Derivatives Rash   Trulicity [Dulaglutide] Nausea And Vomiting    N&V, Dizziness    Social History:   Social History   Socioeconomic History   Marital status: Married    Spouse name: Not on file   Number of children: 1   Years of education: Not on file   Highest education level: Not on file  Occupational History   Occupation: [employer]  Social Needs   Financial resource strain: Not on file   Food insecurity    Worry: Not on file    Inability: Not on  file   Transportation needs    Medical: Not on file    Non-medical: Not on file  Tobacco Use   Smoking status: Former Smoker    Quit date: 08/30/1983    Years since quitting: 35.6   Smokeless tobacco: Former Systems developer   Tobacco comment: Quit 30 years ago.  Substance and Sexual Activity   Alcohol use: Yes    Comment: rare   Drug use: No   Sexual activity: Yes  Lifestyle   Physical activity    Days per week: Not on file    Minutes per session: Not on file   Stress: Not on file  Relationships   Social connections    Talks on phone: Not on file    Gets together: Not on file    Attends religious service: Not on file    Active member of club or organization: Not on file    Attends meetings of clubs or organizations: Not on file    Relationship status: Not on file   Intimate partner violence    Fear of current or ex partner: Not on file    Emotionally abused: Not on file    Physically abused: Not on file    Forced sexual activity: Not on file  Other Topics Concern   Not on file  Social History Narrative   Not on file    Family History:    Family History  Problem Relation Age of Onset   Lung cancer Father    Allergic rhinitis Father    Coronary artery disease Mother    Colon cancer Neg Hx    Heart attack Neg Hx    Hypertension Neg Hx    Stroke Neg Hx     Esophageal cancer Neg Hx    Rectal cancer Neg Hx    Stomach cancer Neg Hx    Pancreatic cancer Neg Hx      ROS:  Please see the history of present illness.  none All other ROS reviewed and negative.     Physical Exam/Data:   Vitals:   04/13/19 1000 04/13/19 1010 04/13/19 1140 04/13/19 1234  BP: 115/79 115/79    Pulse: (!) 117  (!) 124 (!) 123  Resp: (!) 29 (!) 26 (!) 42   Temp:  (!) 100.6 F (38.1 C)    TempSrc:      SpO2:  92% 92% 96%  Weight:      Height:        Intake/Output Summary (Last 24 hours) at 04/13/2019 1348 Last data filed at 04/13/2019 1221 Gross per 24 hour  Intake 1345.04 ml  Output 1625 ml  Net -279.96 ml   Last 3 Weights 04/13/2019 03/05/2019 02/15/2019  Weight (lbs) 266 lb 1.5 oz 254 lb 257 lb  Weight (kg) 120.7 kg 115.214 kg 116.574 kg     Body mass index is 37.11 kg/m.  General:  Well nourished, well developed, in respiratory distress wearing bipap mask. HEENT: normal Lymph: no adenopathy Neck: 7 cm JVD Endocrine:  No thryomegaly Vascular: No carotid bruits; FA pulses 2+ bilaterally without bruits  Cardiac:  normal S1, S2; Reg tachycardia; no murmur  Lungs:  clear to auscultation bilaterally, no wheezing, rhonchi or rales  Abd: soft, nontender, no hepatomegaly  Ext: no edema Musculoskeletal:  No deformities, BUE and BLE strength normal and equal Skin: warm and dry  Neuro:  CNs 2-12 intact, no focal abnormalities noted Psych:  Normal affect   EKG:  The EKG was personally reviewed and  demonstrates:  Sinus tachycardia with old inferior MI Telemetry:  Telemetry was personally reviewed and demonstrates:  Sinus tachycardia  Relevant CV Studies: 2D echo reveals an EF 25%.   Laboratory Data:  High Sensitivity Troponin:   Recent Labs  Lab 04/12/19 1647 04/12/19 1826 04/12/19 2352  TROPONINIHS 136* 150* 1,963*     Cardiac EnzymesNo results for input(s): TROPONINI in the last 168 hours. No results for input(s): TROPIPOC in the last 168  hours.  Chemistry Recent Labs  Lab 04/11/19 1303 04/12/19 1647 04/12/19 2352 04/13/19 0142  NA 135 132*  --  131*  K 4.1 4.1  --  4.2  CL 97* 95*  --  95*  CO2 29 23  --  20*  GLUCOSE 144* 121*  --  220*  BUN 15 16  --  19  CREATININE 1.21 1.05 1.41* 1.51*  CALCIUM 10.2 9.9  --  9.2  GFRNONAA 59* >60 49* 45*  GFRAA >60 >60 57* 53*  ANIONGAP 9 14  --  16*    Recent Labs  Lab 04/12/19 1647 04/13/19 0142  PROT 6.9 6.8  ALBUMIN 3.7 3.3*  AST 41 64*  ALT 29 32  ALKPHOS 56 57  BILITOT 1.5* 2.1*   Hematology Recent Labs  Lab 04/12/19 1647 04/12/19 2352 04/13/19 0142  WBC 14.8* 16.1* 16.8*  RBC 5.63 5.55 5.77  HGB 16.6 16.6 17.2*  HCT 52.2* 51.5 54.1*  MCV 92.7 92.8 93.8  MCH 29.5 29.9 29.8  MCHC 31.8 32.2 31.8  RDW 14.5 14.6 14.6  PLT 141* 132* 176   BNP Recent Labs  Lab 04/12/19 1647  BNP 359.3*    DDimer  Recent Labs  Lab 04/12/19 1647  DDIMER 0.70*     Radiology/Studies:  Dg Chest 2 View  Result Date: 04/12/2019 CLINICAL DATA:  73 year old male with history of chills, cough and runny nose. EXAM: CHEST - 2 VIEW COMPARISON:  Chest x-ray 08/25/2016. FINDINGS: Lung volumes are normal. No consolidative airspace disease. No pleural effusions. No pneumothorax. No pulmonary nodule or mass noted. Pulmonary vasculature and the cardiomediastinal silhouette are within normal limits. IMPRESSION: No radiographic evidence of acute cardiopulmonary disease. Electronically Signed   By: Vinnie Langton M.D.   On: 04/12/2019 14:18   Dg Abdomen 1 View  Result Date: 04/12/2019 CLINICAL DATA:  Orogastric tube placement. EXAM: ABDOMEN - 1 VIEW COMPARISON:  03/12/2013 and abdomen and pelvis CT dated 01/22/2016. FINDINGS: Orogastric tube tip in the mid stomach and side hole in the proximal to mid stomach. The visualized bowel gas pattern is unremarkable. Excreted contrast in the left renal collecting system. Lumbar and lower thoracic spine degenerative changes. Extensive  bilateral patchy airspace opacity. IMPRESSION: 1. Orogastric tube tip in the mid stomach and side hole in the proximal to mid stomach. 2. Extensive bilateral pneumonia or alveolar edema. Electronically Signed   By: Claudie Revering M.D.   On: 04/12/2019 21:28   Ct Angio Chest Pe W And/or Wo Contrast  Result Date: 04/12/2019 CLINICAL DATA:  Chills, cough, runny nose, elevated D-dimer. EXAM: CT ANGIOGRAPHY CHEST WITH CONTRAST TECHNIQUE: Multidetector CT imaging of the chest was performed using the standard protocol during bolus administration of intravenous contrast. Multiplanar CT image reconstructions and MIPs were obtained to evaluate the vascular anatomy. CONTRAST:  156mL OMNIPAQUE IOHEXOL 350 MG/ML SOLN COMPARISON:  None. FINDINGS: Cardiovascular: Majority of the peripheral segmental and subsegmental pulmonary artery branches cannot be characterized due to extensive patient breathing motion artifact. I cannot exclude central obstructing pulmonary embolism  within the main pulmonary arteries bilaterally. No thoracic aortic aneurysm or evidence of aortic dissection. Heart size is within normal limits. No pericardial effusion. Diffuse coronary artery calcifications. Coronary artery stent? Mediastinum/Nodes: No mass or enlarged lymph nodes seen within the mediastinum. Borderline enlarged lymph nodes are seen within the RIGHT upper paratracheal space, RIGHT hilum and subcarinal space, likely reactive in nature. Esophagus appears normal. Trachea appears normal. Lungs/Pleura: Dense consolidation within the RIGHT lower lobe, consistent with pneumonia. Lungs otherwise clear. No pleural effusion or pneumothorax. Upper Abdomen: No acute findings. Musculoskeletal: Mild degenerative spondylosis. No acute or suspicious osseous finding. Review of the MIP images confirms the above findings. IMPRESSION: 1. RIGHT lower lobe pneumonia. 2. Majority of the peripheral segmental and subsegmental pulmonary arteries cannot be  characterized due to extensive patient breathing motion artifact. No central obstructing pulmonary embolism is seen within the main pulmonary arteries bilaterally. 3. Borderline enlarged lymph nodes within the mediastinum and right hilum, likely reactive in nature. 4. Coronary artery calcifications. Aortic Atherosclerosis (ICD10-I70.0). Electronically Signed   By: Franki Cabot M.D.   On: 04/12/2019 19:55   Dg Chest Port 1 View  Result Date: 04/13/2019 CLINICAL DATA:  Acute respiratory failure. Endotracheally intubated. EXAM: PORTABLE CHEST 1 VIEW COMPARISON:  04/12/2019 FINDINGS: Endotracheal tube and nasogastric tube remain in appropriate position. Heart size is stable. Symmetric bilateral airspace disease with central perihilar predominance shows no significant change compared to previous study. No definite pleural effusion. IMPRESSION: 1. Symmetric bilateral airspace disease, without significant change. 2. Endotracheal tube and nasogastric tube remain in appropriate position. Electronically Signed   By: Marlaine Hind M.D.   On: 04/13/2019 04:57   Dg Chest Portable 1 View  Result Date: 04/12/2019 CLINICAL DATA:  Intubated and orogastric tube placed. EXAM: PORTABLE CHEST 1 VIEW COMPARISON:  04/12/2019. FINDINGS: Interval extensive patchy opacity in both lungs. No pleural fluid. Stable borderline enlarged cardiac silhouette. Endotracheal tube in satisfactory position. Orogastric tube extending into the stomach. Thoracic spine degenerative changes. IMPRESSION: Interval extensive bilateral pneumonia or alveolar edema. Electronically Signed   By: Claudie Revering M.D.   On: 04/12/2019 21:26    Assessment and Plan:   1. Acute systolic heart failure - unclear if he has developed ischemia. He denies angina. I agree with bipap. Unclear if wet or dry. We will plan for heart cath. I suspect progression of CAD is the underlying cause of his worsening LV dysfunction although I have seen patients with a stress induced  CM 2. CAD - he is s/p remote PCI. I would expect he will require another left and right heart cath. His troponin is up. We will likely add a beta blocker but I am relectant to do so at this time.  3. Pneumonia - he has a fever to 39.4. His CXR has been reviewed. He is on appropriate anti-biotics. Hopefully he will continue to improve and avoid ventilation.  4. Sinus tachycardia - I suspect that the etiology is multifactorial although his fever likely the biggest driver.  For questions or updates, please contact [place] Please consult [email or web address] for contact info under   Signed, Cristopher Peru, MD  04/13/2019 1:48 PM

## 2019-04-13 NOTE — Progress Notes (Signed)
[facility] at bedside to transport pt to [facility]. Pt is being transported on Bi-PAP 12/6 50%. Report called to receiving RT.

## 2019-04-13 NOTE — Progress Notes (Signed)
Dr. Dallas Breeding paged regarding patient's arrival from [facility] to [facility].  Order for EKG.   RN will continue to monitor.

## 2019-04-13 NOTE — Procedures (Signed)
Extubation Procedure Note  Patient Details:   Name: QUAYSHAUN HUBBERT DOB: Dec 09, 1945 MRN: [id number]   Airway Documentation:    Vent end date: (not recorded) Vent end time: (not recorded)   Evaluation  O2 sats: stable throughout Complications: No apparent complications Patient did tolerate procedure well. Bilateral Breath Sounds: Clear, Diminished   Yes   Pt extubated to 5L [place] per MD order. Cuff leak heard prior to procedure. Pt is able to speak well without any complications. RN and RT at bedside.   Esperanza Sheets T 04/13/2019, 10:12 AM

## 2019-04-13 NOTE — Progress Notes (Signed)
NAME:  Cameron Daniel, MRN:  [id number], DOB:  1945-09-13, LOS: 1 ADMISSION DATE:  04/12/2019, CONSULTATION DATE:  8/14 REFERRING MD:  EDP, CHIEF COMPLAINT:  Respiratory failure, PNA    Brief History   73yo male with hx CAD, HTN, DM who was seen 8/14 in ED with concerns for dehydration after working outside in the heat. He was given IV fluids and d/c. He returned 8/14 with chills, fatigue, cough, dyspnea.  CT chest with RLL consolidation, mildly elevated troponin.  Had progressive respiratory failure with hypoxia, worsening tachypnea and ultimately required intubation in ER.  PCCM called to admit.      Significant Hospital Events     Consults:    Procedures:  ETT 8/14>>>  Significant Diagnostic Tests:  CTA chest 8/14>>>1. RIGHT lower lobe pneumonia. 2. Majority of the peripheral segmental and subsegmental pulmonary arteries cannot be characterized due to extensive patient breathing motion artifact. No central obstructing pulmonary embolism is seen within the main pulmonary arteries bilaterally. 3. Borderline enlarged lymph nodes within the mediastinum and right hilum, likely reactive in nature. 4. Coronary artery calcifications.  Micro Data:  BC x 2 8/14>>>  COVID 8/14>>> NEG  Sputum 8/14>>>  Antimicrobials:  Rocephin 8/14>>> Azithro 8/14>>>  Interim history/subjective:   Febrile 38.5 Critically ill, intubated, tachycardic  Objective   Blood pressure 133/84, pulse (!) 117, temperature (!) 102.4 F (39.1 C), temperature source Bladder, resp. rate (!) 30, height 5\' 11"  (1.803 m), weight 120.7 kg, SpO2 96 %.    Vent Mode: PSV;CPAP FiO2 (%):  [50 %-100 %] 50 % Set Rate:  [16 bmp] 16 bmp Vt Set:  [550 mL-600 mL] 600 mL PEEP:  [5 cmH20-6 cmH20] 5 cmH20 Pressure Support:  [5 cmH20] 5 cmH20 Plateau Pressure:  [23 cmH20-29 cmH20] 24 cmH20   Intake/Output Summary (Last 24 hours) at 04/13/2019 0932 Last data filed at 04/13/2019 0900 Gross per 24 hour  Intake 1345.04  ml  Output 425 ml  Net 920.04 ml   Filed Weights   04/13/19 0600  Weight: 120.7 kg    Examination: General: Well-developed man, intubated, no distress HENT: No pallor, icterus, no JVD Lungs: Diminished breath sounds bilaterally Cardiovascular: Tacky, regular  rhythm no murmurs or gallops Abdomen: Soft, nontender Extremities: Trace peripheral edema Neuro: Alert, interactive, nonfocal   Chest x-ray 8/15 personally reviewed which shows symmetric bilateral airspace disease with perihilar bilateral prominence  Resolved Hospital Problem list     Assessment & Plan:  Acute respiratory failure with hypoxia- rapid onset of hypoxemia, on no O2 at time of presentation.  Possible ischemic event on top of PNA vs adverse reaction to IV dye?  Post intubation film with centrally located pulmonary edema.  Pneumonia is  best visualized posteriorly in the right lower lobe on CT scan of the chest.  PLAN -  He is weaning well on pressure support 5/5 with good tidal volumes We will proceed with extubation after diuresis  Hx CAD  NSTEMI- suspect demand in setting respiratory failure  PLAN -  Cardiology input, will start IV heparin ASA Await echo Concern for severe LV dysfunction that may account for rising lactate Discontinue IV fluids and give Lasix x1 Hold home anti-HTN    AKI -received contrast and now Lasix Watch creatinine  CAP -continue ceftriaxone/azithromycin Urine strep antigen Blood cultures pending Ensure Respiratory culture sent   Hx DM (last HgbA1c 7.0) PLAN -  SSI  Hold home DM rx    Sedation needs on vent  PLAN -  PAD protocol with propofol - RASS goal -1   Best practice:  Diet: NPO Pain/Anxiety/Delirium protocol (if indicated): PAD protocol  VAP protocol (if indicated): ordered  DVT prophylaxis: IV heparin  GI prophylaxis: PPI Glucose control: SSI Mobility: BR Code Status: full Family Communication: done in ER  Disposition:  ICU  The patient is  critically ill with multiple organ systems failure and requires high complexity decision making for assessment and support, frequent evaluation and titration of therapies, application of advanced monitoring technologies and extensive interpretation of multiple databases. Critical Care Time devoted to patient care services described in this note independent of APP/resident  time is 35 minutes.   Kara Mead MD. Shade Flood. [facility] Pager [phone] If no response call [phone]   04/13/2019

## 2019-04-13 NOTE — Progress Notes (Addendum)
  Extubated and seemed to do well on nasal cannula Then developed acute respiratory distress requiring nonrebreather with bilateral crackles Given additional dose of Lasix 40, milligram of morphine IV Started on nitroglycerin drip. Stat echo obtained-reviewed with cardiology, EF is 25 to 30% with wall motion abnormality Arrange for transfer to [facility] for urgent cardiac cath  Reassessed patient-he is able to speak in full sentences through the BiPAP mask, down to 50%, 12/6, tachycardic no ST-T changes on monitor  Updated wife Kathreen Cosier ( who used to work in Government social research officer records at [employer] )  Additional critical care time x 19m  Luis Nickles V. Elsworth Soho MD

## 2019-04-13 NOTE — Progress Notes (Signed)
  Echocardiogram 2D Echocardiogram has been performed.  Cameron Daniel 04/13/2019, 11:50 AM

## 2019-04-13 NOTE — Progress Notes (Signed)
eLink Physician-Brief Progress Note Patient Name: KAIEA ESSELMAN DOB: Mar 29, 1946 MRN: [id number]   Date of Service  04/13/2019  HPI/Events of Note  Hyperglycemia of 224 Prior levels 160, 179, 163 (all less than 180)  eICU Interventions  Subcutaneous scale ordered - low dose to begin with      Intervention Category Intermediate Interventions: Hyperglycemia - evaluation and treatment  Margaretmary Lombard 04/13/2019, 10:53 PM

## 2019-04-13 NOTE — Progress Notes (Signed)
ANTICOAGULATION CONSULT NOTE - Initial Consult  Pharmacy Consult for heparin Indication: chest pain/ACS  Allergies  Allergen Reactions  . Doxazosin Mesylate     Reaction=unknown  . Doxazosin Mesylate Other (See Comments)  . Other Other (See Comments)  . Flomax [Tamsulosin Hcl]     Itching   . Jardiance [Empagliflozin] Other (See Comments)    Nausea, dizziness - Severe (per pt)  . Penicillins Rash and Other (See Comments)    Has patient had a PCN reaction causing immediate rash, facial/tongue/throat swelling, SOB or lightheadedness with hypotension: No Has patient had a PCN reaction causing severe rash involving mucus membranes or skin necrosis: No Has patient had a PCN reaction that required hospitalization No Has patient had a PCN reaction occurring within the last 10 years: No If all of the above answers are "NO", then may proceed with Cephalosporin use.   . Sulfonamide Derivatives Rash  . Trulicity [Dulaglutide] Nausea And Vomiting    N&V, Dizziness    Patient Measurements: Height: 5\' 11"  (180.3 cm) Weight: 266 lb 1.5 oz (120.7 kg) IBW/kg (Calculated) : 75.3 Heparin Dosing Weight: 102 kg  Vital Signs: Temp: 100.6 F (38.1 C) (08/15 0400) Temp Source: Bladder (08/15 0400) BP: 129/88 (08/15 0600) Pulse Rate: 102 (08/15 0600)  Labs: Recent Labs    04/11/19 1846 04/12/19 1647 04/12/19 1826 04/12/19 2352 04/13/19 0142  HGB  --  16.6  --  16.6 17.2*  HCT  --  52.2*  --  51.5 54.1*  PLT  --  141*  --  132* 176  CREATININE  --  1.05  --  1.41* 1.51*  CKTOTAL 357  --   --   --   --   TROPONINIHS  --  136* 150* 1,963*  --     Estimated Creatinine Clearance: 58.5 mL/min (A) (by C-G formula based on SCr of 1.51 mg/dL (H)).   Medical History: Past Medical History:  Diagnosis Date  . Allergic rhinitis, cause unspecified   . Allergy   . Anxiety   . Arthritis   . Cancer ([place])   . Cataract   . Coronary atherosclerosis of unspecified type of vessel, native or  graft    a. Details unclear, someone previously wrote "1985 , arthroplasty x 1."   . Depression    PTSD   . Diverticulosis of colon (without mention of hemorrhage)   . Esophageal reflux   . Esophageal stricture   . Essential hypertension   . Flushing   . Hemorrhoids   . Hyperlipidemia   . Irritable bowel syndrome   . Myocardial infarction ([place])   . Nocturia   . Obesity, unspecified   . Other acquired absence of organ   . Other malaise and fatigue   . Personal history of colonic polyps   . Psychosexual dysfunction with inhibited sexual excitement   . Type II or unspecified type diabetes mellitus without mention of complication, not stated as uncontrolled     Assessment: Pharmacy consulted to dose and monitor heparin infusion in this 73 year old male for NSTEMI. Patient was not on anticoagulants PTA. Received a dose of enoxaparin 40 mg on 8/14 @ 2332.   Today, 04/13/19  Hgb 17.2  Plt 176 - WNL  CrCl > 30 mL/min  APTT and INR ordered STAT  Goal of Therapy:  Heparin level 0.3-0.7 units/ml Monitor platelets by anticoagulation protocol: Yes   Plan:  Give 4000 units bolus x 1 Start heparin infusion at 1300 units/hr Check anti-Xa level in  8 hours and daily while on heparin Continue to monitor H&H and platelets  Lenis Noon, PharmD 04/13/2019,6:34 AM

## 2019-04-14 DIAGNOSIS — I214 Non-ST elevation (NSTEMI) myocardial infarction: Principal | ICD-10-CM

## 2019-04-14 LAB — GLUCOSE, CAPILLARY
Glucose-Capillary: 189 mg/dL — ABNORMAL HIGH (ref 70–99)
Glucose-Capillary: 180 mg/dL — ABNORMAL HIGH (ref 70–99)
Glucose-Capillary: 151 mg/dL — ABNORMAL HIGH (ref 70–99)
Glucose-Capillary: 138 mg/dL — ABNORMAL HIGH (ref 70–99)
Glucose-Capillary: 140 mg/dL — ABNORMAL HIGH (ref 70–99)

## 2019-04-14 LAB — BASIC METABOLIC PANEL
Anion gap: 12 (ref 5–15)
BUN: 28 mg/dL — ABNORMAL HIGH (ref 8–23)
CO2: 26 mmol/L (ref 22–32)
Calcium: 9.2 mg/dL (ref 8.9–10.3)
Chloride: 94 mmol/L — ABNORMAL LOW (ref 98–111)
Creatinine, Ser: 1.34 mg/dL — ABNORMAL HIGH (ref 0.61–1.24)
GFR calc Af Amer: >60 mL/min (ref >60)
GFR calc non Af Amer: 53 mL/min — ABNORMAL LOW (ref >60)
Glucose, Bld: 165 mg/dL — ABNORMAL HIGH (ref 70–99)
Potassium: 3.6 mmol/L (ref 3.5–5.1)
Sodium: 132 mmol/L — ABNORMAL LOW (ref 135–145)

## 2019-04-14 LAB — CBC
HCT: 44.2 % (ref 39.0–52.0)
Hemoglobin: 14.9 g/dL (ref 13.0–17.0)
MCH: 30 pg (ref 26.0–34.0)
MCHC: 33.7 g/dL (ref 30.0–36.0)
MCV: 88.9 fL (ref 80.0–100.0)
Platelets: 163 10*3/uL (ref 150–400)
RBC: 4.97 MIL/uL (ref 4.22–5.81)
RDW: 14.7 % (ref 11.5–15.5)
WBC: 14.2 10*3/uL — ABNORMAL HIGH (ref 4.0–10.5)
nRBC: 0 % (ref 0.0–0.2)

## 2019-04-14 LAB — HEPARIN LEVEL (UNFRACTIONATED): Heparin Unfractionated: 0.32 IU/mL (ref 0.30–0.70)

## 2019-04-14 MED ORDER — INSULIN ASPART 100 UNIT/ML ~~LOC~~ SOLN
0.0000 [IU] | Freq: Every day | SUBCUTANEOUS | Status: DC
  Administered 2019-04-18 – 2019-04-22 (×2): 2 [IU] via SUBCUTANEOUS

## 2019-04-14 MED ORDER — INSULIN ASPART 100 UNIT/ML ~~LOC~~ SOLN
0.0000 [IU] | Freq: Three times a day (TID) | SUBCUTANEOUS | Status: DC
  Administered 2019-04-14: 4 [IU] via SUBCUTANEOUS
  Administered 2019-04-14: 3 [IU] via SUBCUTANEOUS
  Administered 2019-04-15 (×2): 4 [IU] via SUBCUTANEOUS
  Administered 2019-04-15: 3 [IU] via SUBCUTANEOUS
  Administered 2019-04-16 – 2019-04-17 (×6): 7 [IU] via SUBCUTANEOUS
  Administered 2019-04-18: 4 [IU] via SUBCUTANEOUS
  Administered 2019-04-18: 7 [IU] via SUBCUTANEOUS
  Administered 2019-04-19: 4 [IU] via SUBCUTANEOUS
  Administered 2019-04-19: 7 [IU] via SUBCUTANEOUS
  Administered 2019-04-19: 11 [IU] via SUBCUTANEOUS
  Administered 2019-04-20: 7 [IU] via SUBCUTANEOUS
  Administered 2019-04-20: 4 [IU] via SUBCUTANEOUS
  Administered 2019-04-20 – 2019-04-21 (×3): 7 [IU] via SUBCUTANEOUS
  Administered 2019-04-21 – 2019-04-22 (×2): 4 [IU] via SUBCUTANEOUS
  Administered 2019-04-22: 7 [IU] via SUBCUTANEOUS
  Administered 2019-04-22: 11 [IU] via SUBCUTANEOUS
  Administered 2019-04-23: 4 [IU] via SUBCUTANEOUS
  Administered 2019-04-23: 7 [IU] via SUBCUTANEOUS
  Administered 2019-04-23: 4 [IU] via SUBCUTANEOUS
  Administered 2019-04-24: 7 [IU] via SUBCUTANEOUS
  Administered 2019-04-24: 3 [IU] via SUBCUTANEOUS
  Administered 2019-04-24 – 2019-04-25 (×2): 4 [IU] via SUBCUTANEOUS
  Administered 2019-04-25: 7 [IU] via SUBCUTANEOUS
  Administered 2019-04-25 – 2019-04-26 (×2): 4 [IU] via SUBCUTANEOUS

## 2019-04-14 MED ORDER — POLYETHYLENE GLYCOL 3350 17 G PO PACK
17.0000 g | PACK | Freq: Every day | ORAL | Status: DC
  Administered 2019-04-14 – 2019-04-22 (×7): 17 g via ORAL
  Filled 2019-04-14 (×8): qty 1

## 2019-04-14 MED ORDER — ROSUVASTATIN CALCIUM 20 MG PO TABS
40.0000 mg | ORAL_TABLET | Freq: Every day | ORAL | Status: DC
  Administered 2019-04-14 – 2019-04-25 (×12): 40 mg via ORAL
  Filled 2019-04-14 (×12): qty 2

## 2019-04-14 MED ORDER — IPRATROPIUM-ALBUTEROL 0.5-2.5 (3) MG/3ML IN SOLN
3.0000 mL | RESPIRATORY_TRACT | Status: DC | PRN
  Administered 2019-04-18: 3 mL via RESPIRATORY_TRACT
  Filled 2019-04-14: qty 3

## 2019-04-14 MED ORDER — BISACODYL 5 MG PO TBEC
5.0000 mg | DELAYED_RELEASE_TABLET | Freq: Every day | ORAL | Status: DC | PRN
  Administered 2019-04-16 – 2019-04-21 (×2): 5 mg via ORAL
  Filled 2019-04-14 (×2): qty 1

## 2019-04-14 MED ORDER — CARVEDILOL 3.125 MG PO TABS
3.1250 mg | ORAL_TABLET | Freq: Two times a day (BID) | ORAL | Status: DC
  Administered 2019-04-14 – 2019-04-15 (×3): 3.125 mg via ORAL
  Filled 2019-04-14 (×3): qty 1

## 2019-04-14 MED ORDER — PANTOPRAZOLE SODIUM 40 MG PO TBEC
40.0000 mg | DELAYED_RELEASE_TABLET | Freq: Every morning | ORAL | Status: DC
  Administered 2019-04-14 – 2019-04-25 (×11): 40 mg via ORAL
  Filled 2019-04-14 (×11): qty 1

## 2019-04-14 MED ORDER — ASPIRIN 81 MG PO CHEW
81.0000 mg | CHEWABLE_TABLET | Freq: Every day | ORAL | Status: DC
  Administered 2019-04-14 – 2019-04-25 (×12): 81 mg via ORAL
  Filled 2019-04-14 (×12): qty 1

## 2019-04-14 MED ORDER — CITALOPRAM HYDROBROMIDE 20 MG PO TABS
10.0000 mg | ORAL_TABLET | Freq: Every day | ORAL | Status: DC
  Administered 2019-04-14 – 2019-04-15 (×2): 10 mg via ORAL
  Administered 2019-04-17: 08:00:00 via ORAL
  Administered 2019-04-18 – 2019-04-25 (×8): 10 mg via ORAL
  Filled 2019-04-14 (×11): qty 1

## 2019-04-14 MED ORDER — SENNOSIDES-DOCUSATE SODIUM 8.6-50 MG PO TABS
1.0000 | ORAL_TABLET | Freq: Two times a day (BID) | ORAL | Status: DC
  Administered 2019-04-14 – 2019-04-22 (×16): 1 via ORAL
  Filled 2019-04-14 (×16): qty 1

## 2019-04-14 MED ORDER — FINASTERIDE 5 MG PO TABS
5.0000 mg | ORAL_TABLET | Freq: Every day | ORAL | Status: DC
  Administered 2019-04-14 – 2019-04-25 (×11): 5 mg via ORAL
  Filled 2019-04-14 (×11): qty 1

## 2019-04-14 NOTE — Progress Notes (Signed)
Patient taken off of bipap and placed on 3L nasal cannula.  Sats currently 99%.  Vitals are stable.  Will continue to monitor.

## 2019-04-14 NOTE — Progress Notes (Signed)
Progress Note  Patient Name: BILLYE NYDAM Date of Encounter: 04/14/2019  Primary Cardiologist: [name], MD   Subjective   On Bipap; denies dyspnea or CP  Inpatient Medications    Scheduled Meds:  Chlorhexidine Gluconate Cloth  6 each Topical Daily   insulin aspart  1-3 Units Subcutaneous Q4H   ipratropium-albuterol  3 mL Nebulization TID   mouth rinse  15 mL Mouth Rinse BID   Continuous Infusions:  sodium chloride Stopped (04/14/19 0218)   azithromycin Stopped (04/13/19 2350)   cefTRIAXone (ROCEPHIN)  IV Stopped (04/14/19 0059)   famotidine (PEPCID) IV Stopped (04/13/19 2240)   heparin 1,600 Units/hr (04/14/19 8502)   nitroGLYCERIN 10 mcg/min (04/14/19 0653)   PRN Meds: acetaminophen **OR** acetaminophen, albuterol, ondansetron (ZOFRAN) IV   Vital Signs    Vitals:   04/14/19 0300 04/14/19 0428 04/14/19 0500 04/14/19 0600  BP: 113/75 93/63 91/64  91/72  Pulse: 99 90 86 81  Resp: (!) 28 (!) 22 (!) 32 (!) 25  Temp: (!) 100.9 F (38.3 C) 99.7 F (37.6 C) 99.3 F (37.4 C) 99.1 F (37.3 C)  TempSrc:   Bladder   SpO2: 96% 97% 98% 96%  Weight:      Height:        Intake/Output Summary (Last 24 hours) at 04/14/2019 0759 Last data filed at 04/14/2019 0653 Gross per 24 hour  Intake 1448.72 ml  Output 2870 ml  Net -1421.28 ml   Last 3 Weights 04/14/2019 04/13/2019 04/13/2019  Weight (lbs) 255 lb 1.2 oz 257 lb 11.5 oz 266 lb 1.5 oz  Weight (kg) 115.7 kg 116.9 kg 120.7 kg      Telemetry    Sinus with pacs and pvcs - Personally Reviewed   Physical Exam   GEN: No acute distress.  On Bipap Neck: supple Cardiac: RRR Respiratory: Minimal basilar crackles GI: Soft, nontender, non-distended  MS: No edema Neuro:  Nonfocal  Psych: Normal affect   Labs    High Sensitivity Troponin:   Recent Labs  Lab 04/12/19 1647 04/12/19 1826 04/12/19 2352  TROPONINIHS 136* 150* 1,963*      Chemistry Recent Labs  Lab 04/11/19 1303 04/12/19 1647  04/12/19 2352 04/13/19 0142  NA 135 132*  --  131*  K 4.1 4.1  --  4.2  CL 97* 95*  --  95*  CO2 29 23  --  20*  GLUCOSE 144* 121*  --  220*  BUN 15 16  --  19  CREATININE 1.21 1.05 1.41* 1.51*  CALCIUM 10.2 9.9  --  9.2  PROT  --  6.9  --  6.8  ALBUMIN  --  3.7  --  3.3*  AST  --  41  --  64*  ALT  --  29  --  32  ALKPHOS  --  56  --  57  BILITOT  --  1.5*  --  2.1*  GFRNONAA 59* >60 49* 45*  GFRAA >60 >60 57* 53*  ANIONGAP 9 14  --  16*     Hematology Recent Labs  Lab 04/12/19 2352 04/13/19 0142 04/14/19 0312  WBC 16.1* 16.8* 14.2*  RBC 5.55 5.77 4.97  HGB 16.6 17.2* 14.9  HCT 51.5 54.1* 44.2  MCV 92.8 93.8 88.9  MCH 29.9 29.8 30.0  MCHC 32.2 31.8 33.7  RDW 14.6 14.6 14.7  PLT 132* 176 163    BNP Recent Labs  Lab 04/12/19 1647  BNP 359.3*     DDimer  Recent  Labs  Lab 04/12/19 1647  DDIMER 0.70*     Radiology    Dg Chest 2 View  Result Date: 04/12/2019 CLINICAL DATA:  73 year old male with history of chills, cough and runny nose. EXAM: CHEST - 2 VIEW COMPARISON:  Chest x-ray 08/25/2016. FINDINGS: Lung volumes are normal. No consolidative airspace disease. No pleural effusions. No pneumothorax. No pulmonary nodule or mass noted. Pulmonary vasculature and the cardiomediastinal silhouette are within normal limits. IMPRESSION: No radiographic evidence of acute cardiopulmonary disease. Electronically Signed   By: Vinnie Langton M.D.   On: 04/12/2019 14:18   Dg Abdomen 1 View  Result Date: 04/12/2019 CLINICAL DATA:  Orogastric tube placement. EXAM: ABDOMEN - 1 VIEW COMPARISON:  03/12/2013 and abdomen and pelvis CT dated 01/22/2016. FINDINGS: Orogastric tube tip in the mid stomach and side hole in the proximal to mid stomach. The visualized bowel gas pattern is unremarkable. Excreted contrast in the left renal collecting system. Lumbar and lower thoracic spine degenerative changes. Extensive bilateral patchy airspace opacity. IMPRESSION: 1. Orogastric tube tip  in the mid stomach and side hole in the proximal to mid stomach. 2. Extensive bilateral pneumonia or alveolar edema. Electronically Signed   By: Claudie Revering M.D.   On: 04/12/2019 21:28   Ct Angio Chest Pe W And/or Wo Contrast  Result Date: 04/12/2019 CLINICAL DATA:  Chills, cough, runny nose, elevated D-dimer. EXAM: CT ANGIOGRAPHY CHEST WITH CONTRAST TECHNIQUE: Multidetector CT imaging of the chest was performed using the standard protocol during bolus administration of intravenous contrast. Multiplanar CT image reconstructions and MIPs were obtained to evaluate the vascular anatomy. CONTRAST:  189mL OMNIPAQUE IOHEXOL 350 MG/ML SOLN COMPARISON:  None. FINDINGS: Cardiovascular: Majority of the peripheral segmental and subsegmental pulmonary artery branches cannot be characterized due to extensive patient breathing motion artifact. I cannot exclude central obstructing pulmonary embolism within the main pulmonary arteries bilaterally. No thoracic aortic aneurysm or evidence of aortic dissection. Heart size is within normal limits. No pericardial effusion. Diffuse coronary artery calcifications. Coronary artery stent? Mediastinum/Nodes: No mass or enlarged lymph nodes seen within the mediastinum. Borderline enlarged lymph nodes are seen within the RIGHT upper paratracheal space, RIGHT hilum and subcarinal space, likely reactive in nature. Esophagus appears normal. Trachea appears normal. Lungs/Pleura: Dense consolidation within the RIGHT lower lobe, consistent with pneumonia. Lungs otherwise clear. No pleural effusion or pneumothorax. Upper Abdomen: No acute findings. Musculoskeletal: Mild degenerative spondylosis. No acute or suspicious osseous finding. Review of the MIP images confirms the above findings. IMPRESSION: 1. RIGHT lower lobe pneumonia. 2. Majority of the peripheral segmental and subsegmental pulmonary arteries cannot be characterized due to extensive patient breathing motion artifact. No central  obstructing pulmonary embolism is seen within the main pulmonary arteries bilaterally. 3. Borderline enlarged lymph nodes within the mediastinum and right hilum, likely reactive in nature. 4. Coronary artery calcifications. Aortic Atherosclerosis (ICD10-I70.0). Electronically Signed   By: Franki Cabot M.D.   On: 04/12/2019 19:55   Dg Chest Port 1 View  Result Date: 04/13/2019 CLINICAL DATA:  Acute respiratory failure. Endotracheally intubated. EXAM: PORTABLE CHEST 1 VIEW COMPARISON:  04/12/2019 FINDINGS: Endotracheal tube and nasogastric tube remain in appropriate position. Heart size is stable. Symmetric bilateral airspace disease with central perihilar predominance shows no significant change compared to previous study. No definite pleural effusion. IMPRESSION: 1. Symmetric bilateral airspace disease, without significant change. 2. Endotracheal tube and nasogastric tube remain in appropriate position. Electronically Signed   By: Marlaine Hind M.D.   On: 04/13/2019 04:57   Dg  Chest Portable 1 View  Result Date: 04/12/2019 CLINICAL DATA:  Intubated and orogastric tube placed. EXAM: PORTABLE CHEST 1 VIEW COMPARISON:  04/12/2019. FINDINGS: Interval extensive patchy opacity in both lungs. No pleural fluid. Stable borderline enlarged cardiac silhouette. Endotracheal tube in satisfactory position. Orogastric tube extending into the stomach. Thoracic spine degenerative changes. IMPRESSION: Interval extensive bilateral pneumonia or alveolar edema. Electronically Signed   By: Claudie Revering M.D.   On: 04/12/2019 21:26    Patient Profile     73 y.o. male with past medical history of coronary artery disease admitted with pneumonia.  Echocardiogram however demonstrated severely reduced LV function and troponin increased.  Cardiology asked to evaluate.  Assessment & Plan    1 non-ST elevation myocardial infarction-patient has ruled in.  However this occurred in the setting of right lower lobe pneumonia.  His LV  function is noted to be severely reduced on echocardiogram.  He will ultimately require cardiac catheterization but needs to improve from his pneumonia first.  Add aspirin 81 mg daily.  Continue heparin and nitroglycerin.  Add carvedilol 3.125 mg twice daily.  Add Crestor 40 mg daily.  Likely cardiac catheterization in next 48 to 72 hours.  2 cardiomyopathy-ejection fraction severely reduced on echocardiogram.  As outlined above patient will require cardiac catheterization prior to discharge.  Question if there is a component of stress-induced cardiomyopathy.  Add carvedilol 3.125 mg twice daily.  Will add ARB or Entresto later once it is clear renal function stable and blood pressure improves.  3 right lower lobe pneumonia-continue antibiotics.  Wean BiPAP to nasal cannula.  4 acute systolic congestive heart failure-we will hold on further diuresis today as creatinine has increased.  We will add Spironolactone later as renal function improves.  5 acute renal insufficiency-creatinine increased.  We will hold on further diuresis.  Repeat lab work today.  For questions or updates, please contact [place] Please consult [email or web address] for contact info under        Signed, Kirk Ruths, MD  04/14/2019, 7:59 AM

## 2019-04-14 NOTE — Progress Notes (Signed)
ANTICOAGULATION CONSULT NOTE -Follow Up Consult  Pharmacy Consult for heparin Indication: chest pain/ACS  Allergies  Allergen Reactions  . Doxazosin Mesylate     Reaction=unknown  . Doxazosin Mesylate Other (See Comments)  . Other Other (See Comments)  . Flomax [Tamsulosin Hcl]     Itching   . Jardiance [Empagliflozin] Other (See Comments)    Nausea, dizziness - Severe (per pt)  . Penicillins Rash and Other (See Comments)    Has patient had a PCN reaction causing immediate rash, facial/tongue/throat swelling, SOB or lightheadedness with hypotension: No Has patient had a PCN reaction causing severe rash involving mucus membranes or skin necrosis: No Has patient had a PCN reaction that required hospitalization No Has patient had a PCN reaction occurring within the last 10 years: No If all of the above answers are "NO", then may proceed with Cephalosporin use.   . Sulfonamide Derivatives Rash  . Trulicity [Dulaglutide] Nausea And Vomiting    N&V, Dizziness    Patient Measurements: Height: 5\' 11"  (180.3 cm) Weight: 255 lb 1.2 oz (115.7 kg) IBW/kg (Calculated) : 75.3 Heparin Dosing Weight: 102 kg  Vital Signs: Temp: 97.8 F (36.6 C) (08/16 1111) Temp Source: Oral (08/16 1111) BP: 126/62 (08/16 1300) Pulse Rate: 100 (08/16 1300)  Labs: Recent Labs    04/11/19 1846  04/12/19 1647 04/12/19 1826 04/12/19 2352 04/13/19 0142 04/13/19 0731 04/13/19 1709 04/14/19 0312 04/14/19 0833  HGB  --    < > 16.6  --  16.6 17.2*  --   --  14.9  --   HCT  --    < > 52.2*  --  51.5 54.1*  --   --  44.2  --   PLT  --    < > 141*  --  132* 176  --   --  163  --   APTT  --   --   --   --   --   --  150*  --   --   --   LABPROT  --   --   --   --   --   --  16.9*  --   --   --   INR  --   --   --   --   --   --  1.4*  --   --   --   HEPARINUNFRC  --   --   --   --   --   --   --  0.18* 0.32  --   CREATININE  --    < > 1.05  --  1.41* 1.51*  --   --   --  1.34*  CKTOTAL 357  --   --    --   --   --   --   --   --   --   TROPONINIHS  --   --  136* 150* 1,963*  --   --   --   --   --    < > = values in this interval not displayed.    Estimated Creatinine Clearance: 64.5 mL/min (A) (by C-G formula based on SCr of 1.34 mg/dL (H)).   Medical History: Past Medical History:  Diagnosis Date  . Allergic rhinitis, cause unspecified   . Allergy   . Anxiety   . Arthritis   . Cancer ([place])   . Cataract   . Coronary atherosclerosis of unspecified type of vessel, native or graft  a. Details unclear, someone previously wrote "1985 , arthroplasty x 1."   . Depression    PTSD   . Diverticulosis of colon (without mention of hemorrhage)   . Esophageal reflux   . Esophageal stricture   . Essential hypertension   . Flushing   . Hemorrhoids   . Hyperlipidemia   . Irritable bowel syndrome   . Myocardial infarction ([place])   . Nocturia   . Obesity, unspecified   . Other acquired absence of organ   . Other malaise and fatigue   . Personal history of colonic polyps   . Psychosexual dysfunction with inhibited sexual excitement   . Type II or unspecified type diabetes mellitus without mention of complication, not stated as uncontrolled     Assessment: Pharmacy consulted to dose and monitor heparin infusion in this 73 year old male for NSTEMI. Patient was not on anticoagulants PTA. Received a dose of enoxaparin 40 mg on 8/14 @ 2332.   Pt transferred to [facility].  Heparin drip 1600 uts/hr HL 0.32 at goal  No bleeding noted, cbc ok    Goal of Therapy:  Heparin level 0.3-0.7 units/ml Monitor platelets by anticoagulation protocol: Yes   Plan:  Continue heparin drip to 1600 units/hr Daily HL, CBC Continue to monitor H&H and platelets    Bonnita Nasuti Pharm.D. CPP, BCPS Clinical Pharmacist [phone] 04/14/2019 2:43 PM

## 2019-04-14 NOTE — Progress Notes (Addendum)
NAME:  Cameron Daniel, MRN:  [id number], DOB:  [birth year], LOS: 2 ADMISSION DATE:  04/12/2019, CONSULTATION DATE:  8/14 REFERRING MD:  EDP, CHIEF COMPLAINT:  Respiratory failure, PNA    Brief History   73 yo male former smoker presented with concern for dehydration after working outside.  Had cough, dyspnea, fatigue, chills.  CT chest showed RLL PNA and had elevated troponin.  Developed worsening hypoxia and required intubation.  Past Medical History  CAD, HTN, DM, Colon polyps, IBS, HLD, GERD with esophageal stricture, Diverticulosis, Depression, Anxiety  Significant Hospital Events   8/14 Admit 8/15 extubate, cardiology consult, transfer from [facility] to 2h  Consults:  Cardiology 8/15 CHF  Procedures:  ETT 8/14 >> 8/15  Significant Diagnostic Tests:  CTA chest 8/14 >> Rt lower lobe consolidation, atherosclerosis Echo 8/5 >> EF 25 to 30%  Micro Data:  BC x 2 8/14 >>>  COVID 8/14 >>> NEG  Sputum 8/14 >>>  Antimicrobials:  Rocephin 8/14>>> Azithro 8/14>>>  Interim history/subjective:  Had trouble sleeping with Bipap.  Breathing better.  Not having as much cough.  Denies chest pain.  C/o bloating and constipation.  Objective   Blood pressure 103/72, pulse 93, temperature 99.3 F (37.4 C), temperature source Bladder, resp. rate (!) 25, height 5\' 11"  (1.803 m), weight 115.7 kg, SpO2 99 %.    FiO2 (%):  [40 %-50 %] 40 %   Intake/Output Summary (Last 24 hours) at 04/14/2019 0914 Last data filed at 04/14/2019 0800 Gross per 24 hour  Intake 1072.14 ml  Output 3095 ml  Net -2022.86 ml   Filed Weights   04/13/19 0600 04/13/19 1839 04/14/19 0245  Weight: 120.7 kg 116.9 kg 115.7 kg    Examination:  General - alert Eyes - pupils reactive ENT - no sinus tenderness, no stridor Cardiac - regular rate/rhythm, no murmur Chest - equal breath sounds b/l, no wheezing or rales Abdomen - soft, non tender, + bowel sounds Extremities - no cyanosis, clubbing, or edema Skin - no  rashes Neuro - normal strength, moves extremities, follows commands Psych - normal mood and behavior  Resolved Hospital Problem list     Assessment & Plan:   Acute hypoxic respiratory failure from community acquired pneumonia. - oxygen to keep SpO2 > 92% - change Bipap and BDs to prn - f/u CXR intermittently - day 3 of ABx  NSTEMI with acute systolic CHF. Hx of CAD, HTN, HLD. - continue NTG, heparin gtt - continue ASA, carvedilol, crestor - cardiology planning cath in next 48 to 72 hrs  Constipation. Hx of GERD with esophageal stricture. - adjust bowel regimen - continue protonix  DM type II. - SSI - hold outpt glucotrol, actos  Hx of Depression. - celexa  BPH. - proscar   Best practice:  Diet: carb modified, heart healthy diet DVT prophylaxis: IV heparin  GI prophylaxis: protonix Mobility: BR Code Status: full Disposition:  ICU  Labs:   CMP Latest Ref Rng & Units 04/14/2019 04/13/2019 04/12/2019  Glucose 70 - 99 mg/dL 165(H) 220(H) -  BUN 8 - 23 mg/dL 28(H) 19 -  Creatinine 0.61 - 1.24 mg/dL 1.34(H) 1.51(H) 1.41(H)  Sodium 135 - 145 mmol/L 132(L) 131(L) -  Potassium 3.5 - 5.1 mmol/L 3.6 4.2 -  Chloride 98 - 111 mmol/L 94(L) 95(L) -  CO2 22 - 32 mmol/L 26 20(L) -  Calcium 8.9 - 10.3 mg/dL 9.2 9.2 -  Total Protein 6.5 - 8.1 g/dL - 6.8 -  Total Bilirubin 0.3 -  1.2 mg/dL - 2.1(H) -  Alkaline Phos 38 - 126 U/L - 57 -  AST 15 - 41 U/L - 64(H) -  ALT 0 - 44 U/L - 32 -   CBC Latest Ref Rng & Units 04/14/2019 04/13/2019 04/12/2019  WBC 4.0 - 10.5 K/uL 14.2(H) 16.8(H) 16.1(H)  Hemoglobin 13.0 - 17.0 g/dL 14.9 17.2(H) 16.6  Hematocrit 39.0 - 52.0 % 44.2 54.1(H) 51.5  Platelets 150 - 400 K/uL 163 176 132(L)   ABG    Component Value Date/Time   PHART 7.354 04/12/2019 2149   PCO2ART 38.6 04/12/2019 2149   PO2ART 131 (H) 04/12/2019 2149   HCO3 20.3 04/12/2019 2149   TCO2 27 12/13/2007 2032   ACIDBASEDEF 3.4 (H) 04/12/2019 2149   O2SAT 98.1 04/12/2019 2149    CBG (last 3)  Recent Labs    04/13/19 2350 04/14/19 0344 04/14/19 [place], MD [place] [facility] 04/14/2019, 9:25 AM

## 2019-04-15 ENCOUNTER — Inpatient Hospital Stay (HOSPITAL_COMMUNITY): Payer: Medicare HMO

## 2019-04-15 ENCOUNTER — Ambulatory Visit: Payer: Medicare HMO | Admitting: Family Medicine

## 2019-04-15 ENCOUNTER — Other Ambulatory Visit: Payer: Self-pay | Admitting: Family Medicine

## 2019-04-15 DIAGNOSIS — I214 Non-ST elevation (NSTEMI) myocardial infarction: Secondary | ICD-10-CM | POA: Clinically undetermined

## 2019-04-15 DIAGNOSIS — I5020 Unspecified systolic (congestive) heart failure: Secondary | ICD-10-CM | POA: Clinically undetermined

## 2019-04-15 DIAGNOSIS — I5021 Acute systolic (congestive) heart failure: Secondary | ICD-10-CM | POA: Clinically undetermined

## 2019-04-15 DIAGNOSIS — N179 Acute kidney failure, unspecified: Secondary | ICD-10-CM

## 2019-04-15 LAB — COMPREHENSIVE METABOLIC PANEL
ALT: 44 U/L (ref 0–44)
AST: 69 U/L — ABNORMAL HIGH (ref 15–41)
Albumin: 2.4 g/dL — ABNORMAL LOW (ref 3.5–5.0)
Alkaline Phosphatase: 68 U/L (ref 38–126)
Anion gap: 12 (ref 5–15)
BUN: 24 mg/dL — ABNORMAL HIGH (ref 8–23)
CO2: 26 mmol/L (ref 22–32)
Calcium: 9.3 mg/dL (ref 8.9–10.3)
Chloride: 94 mmol/L — ABNORMAL LOW (ref 98–111)
Creatinine, Ser: 0.94 mg/dL (ref 0.61–1.24)
GFR calc Af Amer: >60 mL/min (ref >60)
GFR calc non Af Amer: >60 mL/min (ref >60)
Glucose, Bld: 157 mg/dL — ABNORMAL HIGH (ref 70–99)
Potassium: 3.9 mmol/L (ref 3.5–5.1)
Sodium: 132 mmol/L — ABNORMAL LOW (ref 135–145)
Total Bilirubin: 0.8 mg/dL (ref 0.3–1.2)
Total Protein: 5.6 g/dL — ABNORMAL LOW (ref 6.5–8.1)

## 2019-04-15 LAB — GLUCOSE, CAPILLARY
Glucose-Capillary: 146 mg/dL — ABNORMAL HIGH (ref 70–99)
Glucose-Capillary: 199 mg/dL — ABNORMAL HIGH (ref 70–99)
Glucose-Capillary: 212 mg/dL — ABNORMAL HIGH (ref 70–99)
Glucose-Capillary: 155 mg/dL — ABNORMAL HIGH (ref 70–99)
Glucose-Capillary: 183 mg/dL — ABNORMAL HIGH (ref 70–99)

## 2019-04-15 LAB — HEPARIN LEVEL (UNFRACTIONATED)
Heparin Unfractionated: 0.21 IU/mL — ABNORMAL LOW (ref 0.30–0.70)
Heparin Unfractionated: 0.26 IU/mL — ABNORMAL LOW (ref 0.30–0.70)
Heparin Unfractionated: 0.44 IU/mL (ref 0.30–0.70)

## 2019-04-15 LAB — CBC
HCT: 41.3 % (ref 39.0–52.0)
Hemoglobin: 14 g/dL (ref 13.0–17.0)
MCH: 29.9 pg (ref 26.0–34.0)
MCHC: 33.9 g/dL (ref 30.0–36.0)
MCV: 88.1 fL (ref 80.0–100.0)
Platelets: 172 10*3/uL (ref 150–400)
RBC: 4.69 MIL/uL (ref 4.22–5.81)
RDW: 14.6 % (ref 11.5–15.5)
WBC: 11.9 10*3/uL — ABNORMAL HIGH (ref 4.0–10.5)
nRBC: 0 % (ref 0.0–0.2)

## 2019-04-15 MED ORDER — FENTANYL CITRATE (PF) 100 MCG/2ML IJ SOLN
50.0000 ug | Freq: Once | INTRAMUSCULAR | Status: AC
  Administered 2019-04-15: 50 ug via INTRAVENOUS
  Filled 2019-04-15: qty 2

## 2019-04-15 MED ORDER — SODIUM CHLORIDE 0.9 % IV SOLN
INTRAVENOUS | Status: DC
  Administered 2019-04-16: 06:00:00 via INTRAVENOUS

## 2019-04-15 MED ORDER — SODIUM CHLORIDE 0.9% FLUSH
3.0000 mL | Freq: Two times a day (BID) | INTRAVENOUS | Status: DC
  Administered 2019-04-15: 3 mL via INTRAVENOUS

## 2019-04-15 MED ORDER — TRAMADOL HCL 50 MG PO TABS
50.0000 mg | ORAL_TABLET | Freq: Four times a day (QID) | ORAL | Status: DC | PRN
  Administered 2019-04-15 – 2019-04-25 (×17): 50 mg via ORAL
  Filled 2019-04-15 (×17): qty 1

## 2019-04-15 MED ORDER — SODIUM CHLORIDE 0.9 % IV SOLN: 250.0000 mL | INTRAVENOUS | Status: DC

## 2019-04-15 MED ORDER — SODIUM CHLORIDE 0.9% FLUSH: 3.0000 mL | INTRAVENOUS | Status: DC | PRN

## 2019-04-15 MED ORDER — SODIUM CHLORIDE 0.9 % IV SOLN: 250.0000 mL | INTRAVENOUS | Status: DC | PRN

## 2019-04-15 MED ORDER — LIDOCAINE 5 % EX PTCH
2.0000 | MEDICATED_PATCH | CUTANEOUS | Status: DC
  Administered 2019-04-15 – 2019-04-25 (×10): 2 via TRANSDERMAL
  Filled 2019-04-15 (×12): qty 2

## 2019-04-15 MED ORDER — CARVEDILOL 6.25 MG PO TABS
6.2500 mg | ORAL_TABLET | Freq: Two times a day (BID) | ORAL | Status: DC
  Administered 2019-04-15 – 2019-04-18 (×6): 6.25 mg via ORAL
  Filled 2019-04-15 (×6): qty 1

## 2019-04-15 NOTE — Progress Notes (Signed)
[place] for heparin Indication: chest pain/ACS  Allergies  Allergen Reactions  . Doxazosin Mesylate     Reaction=unknown  . Doxazosin Mesylate Other (See Comments)  . Other Other (See Comments)  . Flomax [Tamsulosin Hcl]     Itching   . Jardiance [Empagliflozin] Other (See Comments)    Nausea, dizziness - Severe (per pt)  . Penicillins Rash and Other (See Comments)    Has patient had a PCN reaction causing immediate rash, facial/tongue/throat swelling, SOB or lightheadedness with hypotension: No Has patient had a PCN reaction causing severe rash involving mucus membranes or skin necrosis: No Has patient had a PCN reaction that required hospitalization No Has patient had a PCN reaction occurring within the last 10 years: No If all of the above answers are "NO", then may proceed with Cephalosporin use.   . Sulfonamide Derivatives Rash  . Trulicity [Dulaglutide] Nausea And Vomiting    N&V, Dizziness    Patient Measurements: Height: 5\' 11"  (180.3 cm) Weight: 254 lb 10.1 oz (115.5 kg) IBW/kg (Calculated) : 75.3 Heparin Dosing Weight: 102 kg  Vital Signs: Temp: 97.2 F (36.2 C) (08/17 0000) Temp Source: Axillary (08/17 0000) BP: 120/76 (08/17 0400) Pulse Rate: 84 (08/17 0400)  Labs: Recent Labs    04/12/19 1647 04/12/19 1826 04/12/19 2352 04/13/19 0142 04/13/19 0731 04/13/19 1709 04/14/19 0312 04/14/19 0833 04/15/19 0409  HGB 16.6  --  16.6 17.2*  --   --  14.9  --  14.0  HCT 52.2*  --  51.5 54.1*  --   --  44.2  --  41.3  PLT 141*  --  132* 176  --   --  163  --  172  APTT  --   --   --   --  150*  --   --   --   --   LABPROT  --   --   --   --  16.9*  --   --   --   --   INR  --   --   --   --  1.4*  --   --   --   --   HEPARINUNFRC  --   --   --   --   --  0.18* 0.32  --  0.21*  CREATININE 1.05  --  1.41* 1.51*  --   --   --  1.34* 0.94  TROPONINIHS 136* 150* 1,963*  --   --   --   --   --   --     Estimated Creatinine  Clearance: 91.8 mL/min (by C-G formula based on SCr of 0.94 mg/dL).  Assessment: 73 y.o. male with NSTEMI for heparin  Goal of Therapy:  Heparin level 0.3-0.7 units/ml Monitor platelets by anticoagulation protocol: Yes   Plan:  Increase Heparin 1800 units/hr  Phillis Knack, PharmD, BCPS  04/15/2019 4:57 AM

## 2019-04-15 NOTE — Progress Notes (Signed)
Progress Note  [name] Date of Encounter: 04/15/2019  Primary Cardiologist: [name], MD   Subjective   Of BiPAP, denies CP or SOB but has severe right hip pain.  Inpatient Medications    Scheduled Meds: . aspirin  81 mg Oral Daily  . carvedilol  3.125 mg Oral BID WC  . Chlorhexidine Gluconate Cloth  6 each Topical Daily  . citalopram  10 mg Oral Daily  . finasteride  5 mg Oral Daily  . insulin aspart  0-20 Units Subcutaneous TID WC  . insulin aspart  0-5 Units Subcutaneous QHS  . lidocaine  2 patch Transdermal Q24H  . mouth rinse  15 mL Mouth Rinse BID  . pantoprazole  40 mg Oral q morning - 10a  . polyethylene glycol  17 g Oral Daily  . rosuvastatin  40 mg Oral q1800  . senna-docusate  1 tablet Oral BID   Continuous Infusions: . sodium chloride    . azithromycin Stopped (04/14/19 2329)  . cefTRIAXone (ROCEPHIN)  IV Stopped (04/15/19 0105)  . heparin 1,800 Units/hr (04/15/19 0800)   PRN Meds: [DISCONTINUED] acetaminophen **OR** acetaminophen, bisacodyl, ipratropium-albuterol, ondansetron (ZOFRAN) IV   Vital Signs    Vitals:   04/15/19 0500 04/15/19 0600 04/15/19 0733 04/15/19 0800  BP: 114/80 96/85 (!) 139/93 136/76  Pulse: 79 75 85 87  Resp: (!) 22 (!) 23 (!) 22 (!) 24  Temp:    97.7 F (36.5 C)  TempSrc:    Oral  SpO2: 96% 96% 96% 95%  Weight:      Height:        Intake/Output Summary (Last 24 hours) at 04/15/2019 1059 Last data filed at 04/15/2019 0800 Gross per 24 hour  Intake 1217.33 ml  Output 1515 ml  Net -297.67 ml   Last 3 Weights 04/15/2019 04/14/2019 04/13/2019  Weight (lbs) 254 lb 10.1 oz 255 lb 1.2 oz 257 lb 11.5 oz  Weight (kg) 115.5 kg 115.7 kg 116.9 kg      Telemetry    Sinus rhuythm - Personally Reviewed  Physical Exam   GEN: No acute distress.  On Bipap Neck: supple Cardiac: RRR Respiratory: Minimal basilar crackles GI: Soft, nontender, non-distended  MS: No edema Neuro:  Nonfocal  Psych:  Normal affect   Labs    High Sensitivity Troponin:   Recent Labs  Lab 04/12/19 1647 04/12/19 1826 04/12/19 2352  TROPONINIHS 136* 150* 1,963*      Chemistry Recent Labs  Lab 04/12/19 1647  04/13/19 0142 04/14/19 0833 04/15/19 0409  NA 132*  --  131* 132* 132*  K 4.1  --  4.2 3.6 3.9  CL 95*  --  95* 94* 94*  CO2 23  --  20* 26 26  GLUCOSE 121*  --  220* 165* 157*  BUN 16  --  19 28* 24*  CREATININE 1.05   < > 1.51* 1.34* 0.94  CALCIUM 9.9  --  9.2 9.2 9.3  PROT 6.9  --  6.8  --  5.6*  ALBUMIN 3.7  --  3.3*  --  2.4*  AST 41  --  64*  --  69*  ALT 29  --  32  --  44  ALKPHOS 56  --  57  --  68  BILITOT 1.5*  --  2.1*  --  0.8  GFRNONAA >60   < > 45* 53* >60  GFRAA >60   < > 53* >60 >60  ANIONGAP 14  --  16* 12 12   < > = values in this interval not displayed.     Hematology Recent Labs  Lab 04/13/19 0142 04/14/19 0312 04/15/19 0409  WBC 16.8* 14.2* 11.9*  RBC 5.77 4.97 4.69  HGB 17.2* 14.9 14.0  HCT 54.1* 44.2 41.3  MCV 93.8 88.9 88.1  MCH 29.8 30.0 29.9  MCHC 31.8 33.7 33.9  RDW 14.6 14.7 14.6  PLT 176 163 172    BNP Recent Labs  Lab 04/12/19 1647  BNP 359.3*     DDimer  Recent Labs  Lab 04/12/19 1647  DDIMER 0.70*     Radiology    Dg Chest Port 1 View  Result Date: 04/15/2019 CLINICAL DATA:  73 year old male with a history pneumonia EXAM: PORTABLE CHEST 1 VIEW COMPARISON:  April 13, 2019 FINDINGS: Cardiomediastinal silhouette unchanged in size and contour. Interval removal of the endotracheal tube and gastric tube. Significantly improved airspace opacities of bilateral lungs. No pleural effusion or pneumothorax. No displaced fracture identified. IMPRESSION: Significant improvement in bilateral airspace opacities, with interval removal of endotracheal tube and gastric tube. Electronically Signed   By: Corrie Mckusick D.O.   On: 04/15/2019 07:51   TTE; 04/13/2019    1. The left ventricle has severely reduced systolic function, with an  ejection fraction of 25-30%. The cavity size was normal. There is mildly increased left ventricular wall thickness. Left ventricular diastolic Doppler parameters are indeterminate.  2. Severe hypokinesis of the left ventricular, entire inferior wall and anteroseptal wall.  3. Left atrial size was mildly dilated.  4. Small pericardial effusion.  5. The pericardial effusion is posterior to the left ventricle.  6. The mitral valve is abnormal. Mild thickening of the mitral valve leaflet. There is mild to moderate mitral annular calcification present.  7. The aortic valve is tricuspid. Mild sclerosis of the aortic valve. No stenosis of the aortic valve.  8. The aorta is normal unless otherwise noted.  9. The inferior vena cava was dilated in size with <50% respiratory variability. 10. The interatrial septum was not assessed.    Patient Profile     73 y.o. male with past medical history of coronary artery disease admitted with pneumonia.  Echocardiogram however demonstrated severely reduced LV function and troponin increased.  Cardiology asked to evaluate.  Assessment & Plan    1 non-ST elevation myocardial infarction-patient has ruled in.  However this occurred in the setting of right lower lobe pneumonia.  His LV function is noted to be severely reduced on echocardiogram with LVEF 25-30% and regional wall motion abnormalities. We will schedule him for a cardiac cath tomorrow, risk and benefits were explained, he agrees.  Continue aspirin 81 mg daily, heparin and nitroglycerin, crestor.  Increase carvedilol to 6.25 mg twice daily.   2 cardiomyopathy-ejection fraction severely reduced on echocardiogram.  As outlined above patient will require cardiac catheterization prior to discharge.  Question if there is a component of stress-induced cardiomyopathy. Increase carvedilol 3.125 mg twice daily.  Will add ARB or Entresto later once it is clear renal function stable and post cath.  3 right lower  lobe pneumonia-continue antibiotics.  Wean BiPAP to nasal cannula.  4 acute systolic congestive heart failure-we will hold on further diuresis today as creatinine has increased.  We will add Spironolactone later as renal function improves.  5 acute renal insufficiency-creatinine increased.  We will hold on further diuresis. Crea improved 1.34 -> 0.94.  6. Right hip OA - start tramadol for pain control  For  questions or updates, please contact [place] Please consult [email or web address] for contact info under     Signed, [name], MD  04/15/2019, 10:59 AM

## 2019-04-15 NOTE — Progress Notes (Signed)
eLink Physician-Brief Progress Note [name] DOB: 09/03/1945 MRN: [id number]   Date of Service  04/15/2019  HPI/Events of Note  [place] - Patient c/o hip and back pain.   Request for additional pain medication.   eICU Interventions  Will order Fentanyl 50 mcg IV X 1 now.      Intervention Category Major Interventions: Other:  Lysle Dingwall 04/15/2019, 8:54 PM

## 2019-04-15 NOTE — Interval H&P Note (Signed)
Cath Lab Visit (complete for each Cath Lab visit)  Clinical Evaluation Leading to the Procedure:   ACS: Yes.    Non-ACS:    Anginal Classification: CCS Daniel  Anti-ischemic medical therapy: Minimal Therapy (1 class of medications)  Non-Invasive Test Results: No non-invasive testing performed  Prior CABG: No previous CABG      History and Physical Interval Note:  04/15/2019 9:00 PM  Cameron Daniel  has presented today for surgery, with the diagnosis of chest pain.  The various methods of treatment have been discussed with the patient and family. After consideration of risks, benefits and other options for treatment, the patient has consented to  Procedure(s): LEFT HEART CATH AND CORONARY ANGIOGRAPHY (N/A) as a surgical intervention.  The patient's history has been reviewed, patient examined, no change in status, stable for surgery.  I have reviewed the patient's chart and labs.  Questions were answered to the patient's satisfaction.     Cameron Daniel

## 2019-04-15 NOTE — Progress Notes (Addendum)
NAME:  Cameron Daniel, MRN:  [id number], DOB:  [birth year], LOS: 3 ADMISSION DATE:  04/12/2019, CONSULTATION DATE:  8/14 REFERRING MD:  EDP, CHIEF COMPLAINT:  Respiratory failure, PNA    Brief History   73 yo male former smoker presented with concern for dehydration after working outside.  Had cough, dyspnea, fatigue, chills.  CT chest showed RLL PNA and had elevated troponin.  Developed worsening hypoxia and required intubation.  Past Medical History  CAD, HTN, DM, Colon polyps, IBS, HLD, GERD with esophageal stricture, Diverticulosis, Depression, Anxiety  Significant Hospital Events   8/14 Admit 8/15 extubate, cardiology consult, transfer from [facility] to 2h  Consults:  Cardiology 8/15 CHF  Procedures:  ETT 8/14 >> 8/15  Significant Diagnostic Tests:  CTA chest 8/14 >> Rt lower lobe consolidation, atherosclerosis Echo 8/5 >> EF 25 to 30%  Micro Data:  BC x 2 8/14 >>>  COVID 8/14 >>> NEG  Sputum 8/14 >>>  Antimicrobials:  Rocephin 8/14>>> Azithro 8/14>>>  Interim history/subjective:  States "feeling lots better" Denies chest pain or difficulty breathing  Objective   Blood pressure 136/76, pulse 87, temperature 97.7 F (36.5 C), temperature source Oral, resp. rate (!) 24, height 5\' 11"  (1.803 m), weight 115.5 kg, SpO2 95 %.        Intake/Output Summary (Last 24 hours) at 04/15/2019 0838 Last data filed at 04/15/2019 0800 Gross per 24 hour  Intake 1255.18 ml  Output 1515 ml  Net -259.82 ml   Filed Weights   04/13/19 1839 04/14/19 0245 04/15/19 0250  Weight: 116.9 kg 115.7 kg 115.5 kg    Examination:  General - Overweight older adult M,  Seated in chair NAD  HEENT- NCAT anicteric sclera. Pink mmm. Patent nares with 2LNC in place. Trachea midline  Cardiac - RRR s1s2 no rgm Cap refill <3 sec BUE BLE  Chest - Symmterical chest expansion. No accessory muscle use. CTA bilaterally  Abdomen - protuberant, soft, round, ndnt. Hypoactive x4  Extremities - symmetrical  bulk and tone, no obvious deformity, no cyanosis no clubbing  Skin - clean dry warm without rash Neuro - AAOx4. PERRL. Following commands Psych - euthymic mood congruent affect, appropriate for age and situation   Resolved Hospital Problem list     Assessment & Plan:   Acute hypoxic respiratory failure  -suspected CAP  CXR 8/17 with improved bilateral ASD P -Supplemental O2 for SpO2 > 92% -PRN BiPAP -PRN albuterol  -PRN CXR - continue azithromycin, ceftriaxone for 5 day course, suspected CAP -IS, Flutter   NSTEMI with acute systolic CHF. Hx of CAD, HTN, HLD. P - ntg has been off since 8/16 PM. Will dc -Continue tele - heparin gtt per pharmacy  - continue ASA, carvedilol, crestor - cardiology tentatively planning cath later this week (no firm date as of yet)   Constipation Hx of GERD with esophageal stricture P - BID senna, qD miralax, PRN dulcolax - Protonix  DM type II. P - SSI - hold outpt glucotrol, actos  Hx of Depression P - celexa  BPH P - proscar  Hyponatremia, mild P - NS 102ml/hr -AM BMP, adjust as needed   Best practice:  Diet: carb modified, heart healthy diet DVT prophylaxis: IV heparin  GI prophylaxis: protonix Mobility: PT. OOB to chair Code Status: full Disposition:  Presently in ICU. However anticipate that patient may be stable for transfer to SDU. Will discuss with PCCM MD  Labs:   CMP Latest Ref Rng & Units 04/15/2019 04/14/2019 04/13/2019  Glucose 70 - 99 mg/dL 157(H) 165(H) 220(H)  BUN 8 - 23 mg/dL 24(H) 28(H) 19  Creatinine 0.61 - 1.24 mg/dL 0.94 1.34(H) 1.51(H)  Sodium 135 - 145 mmol/L 132(L) 132(L) 131(L)  Potassium 3.5 - 5.1 mmol/L 3.9 3.6 4.2  Chloride 98 - 111 mmol/L 94(L) 94(L) 95(L)  CO2 22 - 32 mmol/L 26 26 20(L)  Calcium 8.9 - 10.3 mg/dL 9.3 9.2 9.2  Total Protein 6.5 - 8.1 g/dL 5.6(L) - 6.8  Total Bilirubin 0.3 - 1.2 mg/dL 0.8 - 2.1(H)  Alkaline Phos 38 - 126 U/L 68 - 57  AST 15 - 41 U/L 69(H) - 64(H)  ALT 0 - 44  U/L 44 - 32   CBC Latest Ref Rng & Units 04/15/2019 04/14/2019 04/13/2019  WBC 4.0 - 10.5 K/uL 11.9(H) 14.2(H) 16.8(H)  Hemoglobin 13.0 - 17.0 g/dL 14.0 14.9 17.2(H)  Hematocrit 39.0 - 52.0 % 41.3 44.2 54.1(H)  Platelets 150 - 400 K/uL 172 163 176   ABG    Component Value Date/Time   PHART 7.354 04/12/2019 2149   PCO2ART 38.6 04/12/2019 2149   PO2ART 131 (H) 04/12/2019 2149   HCO3 20.3 04/12/2019 2149   TCO2 27 12/13/2007 2032   ACIDBASEDEF 3.4 (H) 04/12/2019 2149   O2SAT 98.1 04/12/2019 2149   CBG (last 3)  Recent Labs    04/14/19 1637 04/14/19 2130 04/15/19 [place] MSN, AGACNP-BC [place] [phone] If no answer, [phone] 04/15/2019, 8:38 AM

## 2019-04-15 NOTE — Progress Notes (Signed)
[place] for heparin Indication: chest pain/ACS  Allergies  Allergen Reactions  . Doxazosin Mesylate     Reaction=unknown  . Doxazosin Mesylate Other (See Comments)  . Other Other (See Comments)  . Flomax [Tamsulosin Hcl]     Itching   . Jardiance [Empagliflozin] Other (See Comments)    Nausea, dizziness - Severe (per pt)  . Penicillins Rash and Other (See Comments)    Has patient had a PCN reaction causing immediate rash, facial/tongue/throat swelling, SOB or lightheadedness with hypotension: No Has patient had a PCN reaction causing severe rash involving mucus membranes or skin necrosis: No Has patient had a PCN reaction that required hospitalization No Has patient had a PCN reaction occurring within the last 10 years: No If all of the above answers are "NO", then may proceed with Cephalosporin use.   . Sulfonamide Derivatives Rash  . Trulicity [Dulaglutide] Nausea And Vomiting    N&V, Dizziness    Patient Measurements: Height: 5\' 11"  (180.3 cm) Weight: 254 lb 10.1 oz (115.5 kg) IBW/kg (Calculated) : 75.3 Heparin Dosing Weight: 102 kg  Vital Signs: Temp: 97.7 F (36.5 C) (08/17 2015) Temp Source: Oral (08/17 2015) BP: 136/81 (08/17 2100) Pulse Rate: 72 (08/17 2100)  Labs: Recent Labs    04/12/19 2352 04/13/19 0142 04/13/19 0731  04/14/19 0312 04/14/19 0833 04/15/19 0409 04/15/19 1256 04/15/19 2152  HGB 16.6 17.2*  --   --  14.9  --  14.0  --   --   HCT 51.5 54.1*  --   --  44.2  --  41.3  --   --   PLT 132* 176  --   --  163  --  172  --   --   APTT  --   --  150*  --   --   --   --   --   --   LABPROT  --   --  16.9*  --   --   --   --   --   --   INR  --   --  1.4*  --   --   --   --   --   --   HEPARINUNFRC  --   --   --    < > 0.32  --  0.21* 0.26* 0.44  CREATININE 1.41* 1.51*  --   --   --  1.34* 0.94  --   --   TROPONINIHS 1,963*  --   --   --   --   --   --   --   --    < > = values in this interval not  displayed.    Estimated Creatinine Clearance: 91.8 mL/min (by C-G formula based on SCr of 0.94 mg/dL).  Assessment: 73 y.o. male with NSTEMI for heparin. Plans noted for cath on 8/18  Heparin level is therapeutic after rate increase.   Goal of Therapy:  Heparin level 0.3-0.7 units/ml Monitor platelets by anticoagulation protocol: Yes   Plan:  Continue Heparin to 2000 units/hr Follow-up daily levels.  Plan for Cath 8/18.   Sloan Leiter, PharmD, BCPS, BCCCP Clinical Pharmacist Please refer to [facility] for [place] numbers 04/15/2019, 10:23 PM

## 2019-04-15 NOTE — H&P (View-Only) (Signed)
Progress Note  [name] Date of Encounter: 04/15/2019  Primary Cardiologist: [name], MD   Subjective   Of BiPAP, denies CP or SOB but has severe right hip pain.  Inpatient Medications    Scheduled Meds: . aspirin  81 mg Oral Daily  . carvedilol  3.125 mg Oral BID WC  . Chlorhexidine Gluconate Cloth  6 each Topical Daily  . citalopram  10 mg Oral Daily  . finasteride  5 mg Oral Daily  . insulin aspart  0-20 Units Subcutaneous TID WC  . insulin aspart  0-5 Units Subcutaneous QHS  . lidocaine  2 patch Transdermal Q24H  . mouth rinse  15 mL Mouth Rinse BID  . pantoprazole  40 mg Oral q morning - 10a  . polyethylene glycol  17 g Oral Daily  . rosuvastatin  40 mg Oral q1800  . senna-docusate  1 tablet Oral BID   Continuous Infusions: . sodium chloride    . azithromycin Stopped (04/14/19 2329)  . cefTRIAXone (ROCEPHIN)  IV Stopped (04/15/19 0105)  . heparin 1,800 Units/hr (04/15/19 0800)   PRN Meds: [DISCONTINUED] acetaminophen **OR** acetaminophen, bisacodyl, ipratropium-albuterol, ondansetron (ZOFRAN) IV   Vital Signs    Vitals:   04/15/19 0500 04/15/19 0600 04/15/19 0733 04/15/19 0800  BP: 114/80 96/85 (!) 139/93 136/76  Pulse: 79 75 85 87  Resp: (!) 22 (!) 23 (!) 22 (!) 24  Temp:    97.7 F (36.5 C)  TempSrc:    Oral  SpO2: 96% 96% 96% 95%  Weight:      Height:        Intake/Output Summary (Last 24 hours) at 04/15/2019 1059 Last data filed at 04/15/2019 0800 Gross per 24 hour  Intake 1217.33 ml  Output 1515 ml  Net -297.67 ml   Last 3 Weights 04/15/2019 04/14/2019 04/13/2019  Weight (lbs) 254 lb 10.1 oz 255 lb 1.2 oz 257 lb 11.5 oz  Weight (kg) 115.5 kg 115.7 kg 116.9 kg      Telemetry    Sinus rhuythm - Personally Reviewed  Physical Exam   GEN: No acute distress.  On Bipap Neck: supple Cardiac: RRR Respiratory: Minimal basilar crackles GI: Soft, nontender, non-distended  MS: No edema Neuro:  Nonfocal  Psych:  Normal affect   Labs    High Sensitivity Troponin:   Recent Labs  Lab 04/12/19 1647 04/12/19 1826 04/12/19 2352  TROPONINIHS 136* 150* 1,963*      Chemistry Recent Labs  Lab 04/12/19 1647  04/13/19 0142 04/14/19 0833 04/15/19 0409  NA 132*  --  131* 132* 132*  K 4.1  --  4.2 3.6 3.9  CL 95*  --  95* 94* 94*  CO2 23  --  20* 26 26  GLUCOSE 121*  --  220* 165* 157*  BUN 16  --  19 28* 24*  CREATININE 1.05   < > 1.51* 1.34* 0.94  CALCIUM 9.9  --  9.2 9.2 9.3  PROT 6.9  --  6.8  --  5.6*  ALBUMIN 3.7  --  3.3*  --  2.4*  AST 41  --  64*  --  69*  ALT 29  --  32  --  44  ALKPHOS 56  --  57  --  68  BILITOT 1.5*  --  2.1*  --  0.8  GFRNONAA >60   < > 45* 53* >60  GFRAA >60   < > 53* >60 >60  ANIONGAP 14  --  16* 12 12   < > = values in this interval not displayed.     Hematology Recent Labs  Lab 04/13/19 0142 04/14/19 0312 04/15/19 0409  WBC 16.8* 14.2* 11.9*  RBC 5.77 4.97 4.69  HGB 17.2* 14.9 14.0  HCT 54.1* 44.2 41.3  MCV 93.8 88.9 88.1  MCH 29.8 30.0 29.9  MCHC 31.8 33.7 33.9  RDW 14.6 14.7 14.6  PLT 176 163 172    BNP Recent Labs  Lab 04/12/19 1647  BNP 359.3*     DDimer  Recent Labs  Lab 04/12/19 1647  DDIMER 0.70*     Radiology    Dg Chest Port 1 View  Result Date: 04/15/2019 CLINICAL DATA:  73 year old male with a history pneumonia EXAM: PORTABLE CHEST 1 VIEW COMPARISON:  April 13, 2019 FINDINGS: Cardiomediastinal silhouette unchanged in size and contour. Interval removal of the endotracheal tube and gastric tube. Significantly improved airspace opacities of bilateral lungs. No pleural effusion or pneumothorax. No displaced fracture identified. IMPRESSION: Significant improvement in bilateral airspace opacities, with interval removal of endotracheal tube and gastric tube. Electronically Signed   By: Corrie Mckusick D.O.   On: 04/15/2019 07:51   TTE; 04/13/2019    1. The left ventricle has severely reduced systolic function, with an  ejection fraction of 25-30%. The cavity size was normal. There is mildly increased left ventricular wall thickness. Left ventricular diastolic Doppler parameters are indeterminate.  2. Severe hypokinesis of the left ventricular, entire inferior wall and anteroseptal wall.  3. Left atrial size was mildly dilated.  4. Small pericardial effusion.  5. The pericardial effusion is posterior to the left ventricle.  6. The mitral valve is abnormal. Mild thickening of the mitral valve leaflet. There is mild to moderate mitral annular calcification present.  7. The aortic valve is tricuspid. Mild sclerosis of the aortic valve. No stenosis of the aortic valve.  8. The aorta is normal unless otherwise noted.  9. The inferior vena cava was dilated in size with <50% respiratory variability. 10. The interatrial septum was not assessed.    Patient Profile     73 y.o. male with past medical history of coronary artery disease admitted with pneumonia.  Echocardiogram however demonstrated severely reduced LV function and troponin increased.  Cardiology asked to evaluate.  Assessment & Plan    1 non-ST elevation myocardial infarction-patient has ruled in.  However this occurred in the setting of right lower lobe pneumonia.  His LV function is noted to be severely reduced on echocardiogram with LVEF 25-30% and regional wall motion abnormalities. We will schedule him for a cardiac cath tomorrow, risk and benefits were explained, he agrees.  Continue aspirin 81 mg daily, heparin and nitroglycerin, crestor.  Increase carvedilol to 6.25 mg twice daily.   2 cardiomyopathy-ejection fraction severely reduced on echocardiogram.  As outlined above patient will require cardiac catheterization prior to discharge.  Question if there is a component of stress-induced cardiomyopathy. Increase carvedilol 3.125 mg twice daily.  Will add ARB or Entresto later once it is clear renal function stable and post cath.  3 right lower  lobe pneumonia-continue antibiotics.  Wean BiPAP to nasal cannula.  4 acute systolic congestive heart failure-we will hold on further diuresis today as creatinine has increased.  We will add Spironolactone later as renal function improves.  5 acute renal insufficiency-creatinine increased.  We will hold on further diuresis. Crea improved 1.34 -> 0.94.  6. Right hip OA - start tramadol for pain control  For  questions or updates, please contact [place] Please consult [email or web address] for contact info under     Signed, [name], MD  04/15/2019, 10:59 AM

## 2019-04-15 NOTE — Progress Notes (Signed)
[place] for heparin Indication: chest pain/ACS  Allergies  Allergen Reactions  . Doxazosin Mesylate     Reaction=unknown  . Doxazosin Mesylate Other (See Comments)  . Other Other (See Comments)  . Flomax [Tamsulosin Hcl]     Itching   . Jardiance [Empagliflozin] Other (See Comments)    Nausea, dizziness - Severe (per pt)  . Penicillins Rash and Other (See Comments)    Has patient had a PCN reaction causing immediate rash, facial/tongue/throat swelling, SOB or lightheadedness with hypotension: No Has patient had a PCN reaction causing severe rash involving mucus membranes or skin necrosis: No Has patient had a PCN reaction that required hospitalization No Has patient had a PCN reaction occurring within the last 10 years: No If all of the above answers are "NO", then may proceed with Cephalosporin use.   . Sulfonamide Derivatives Rash  . Trulicity [Dulaglutide] Nausea And Vomiting    N&V, Dizziness    Patient Measurements: Height: 5\' 11"  (180.3 cm) Weight: 254 lb 10.1 oz (115.5 kg) IBW/kg (Calculated) : 75.3 Heparin Dosing Weight: 102 kg  Vital Signs: Temp: 97.6 F (36.4 C) (08/17 1200) Temp Source: Oral (08/17 1200) BP: 133/83 (08/17 1200) Pulse Rate: 84 (08/17 1200)  Labs: Recent Labs    04/12/19 1647 04/12/19 1826 04/12/19 2352 04/13/19 0142 04/13/19 0731  04/14/19 0312 04/14/19 0833 04/15/19 0409 04/15/19 1256  HGB 16.6  --  16.6 17.2*  --   --  14.9  --  14.0  --   HCT 52.2*  --  51.5 54.1*  --   --  44.2  --  41.3  --   PLT 141*  --  132* 176  --   --  163  --  172  --   APTT  --   --   --   --  150*  --   --   --   --   --   LABPROT  --   --   --   --  16.9*  --   --   --   --   --   INR  --   --   --   --  1.4*  --   --   --   --   --   HEPARINUNFRC  --   --   --   --   --    < > 0.32  --  0.21* 0.26*  CREATININE 1.05  --  1.41* 1.51*  --   --   --  1.34* 0.94  --   TROPONINIHS 136* 150* 1,963*  --   --   --   --    --   --   --    < > = values in this interval not displayed.    Estimated Creatinine Clearance: 91.8 mL/min (by C-G formula based on SCr of 0.94 mg/dL).  Assessment: 73 y.o. male with NSTEMI for heparin. Plans noted for cath on 8/18 -heparin level= 0.26  Goal of Therapy:  Heparin level 0.3-0.7 units/ml Monitor platelets by anticoagulation protocol: Yes   Plan:  -Increase Heparin to 2000 units/hr -Heparin level in 6 hours and daily wth CBC daily  Hildred Laser, PharmD Clinical Pharmacist **Pharmacist phone directory can now be found on [email or web address] ([email or web address]).  Listed under [place].

## 2019-04-16 ENCOUNTER — Encounter (HOSPITAL_COMMUNITY)
Admission: EM | Disposition: A | Discharge status: Home Health | Payer: Self-pay | Source: Home / Self Care | Attending: Internal Medicine

## 2019-04-16 ENCOUNTER — Other Ambulatory Visit: Payer: Self-pay | Admitting: *Deleted

## 2019-04-16 ENCOUNTER — Inpatient Hospital Stay (HOSPITAL_COMMUNITY): Payer: Medicare HMO

## 2019-04-16 ENCOUNTER — Inpatient Hospital Stay: Payer: Self-pay

## 2019-04-16 DIAGNOSIS — I5043 Acute on chronic combined systolic (congestive) and diastolic (congestive) heart failure: Secondary | ICD-10-CM

## 2019-04-16 DIAGNOSIS — I2510 Atherosclerotic heart disease of native coronary artery without angina pectoris: Secondary | ICD-10-CM

## 2019-04-16 DIAGNOSIS — I214 Non-ST elevation (NSTEMI) myocardial infarction: Secondary | ICD-10-CM

## 2019-04-16 DIAGNOSIS — J189 Pneumonia, unspecified organism: Secondary | ICD-10-CM

## 2019-04-16 DIAGNOSIS — I25118 Atherosclerotic heart disease of native coronary artery with other forms of angina pectoris: Secondary | ICD-10-CM

## 2019-04-16 DIAGNOSIS — J810 Acute pulmonary edema: Secondary | ICD-10-CM

## 2019-04-16 HISTORY — PX: RIGHT/LEFT HEART CATH AND CORONARY ANGIOGRAPHY: CATH118266

## 2019-04-16 LAB — POCT I-STAT EG7
Acid-Base Excess: 2 mmol/L (ref 0.0–2.0)
Bicarbonate: 28.7 mmol/L — ABNORMAL HIGH (ref 20.0–28.0)
Calcium, Ion: 1.34 mmol/L (ref 1.15–1.40)
HCT: 32 % — ABNORMAL LOW (ref 39.0–52.0)
Hemoglobin: 10.9 g/dL — ABNORMAL LOW (ref 13.0–17.0)
O2 Saturation: 52 %
Potassium: 4.3 mmol/L (ref 3.5–5.1)
Sodium: 131 mmol/L — ABNORMAL LOW (ref 135–145)
TCO2: 30 mmol/L (ref 22–32)
pCO2, Ven: 52.3 mmHg (ref 44.0–60.0)
pH, Ven: 7.347 (ref 7.250–7.430)
pO2, Ven: 30 mmHg — CL (ref 32.0–45.0)

## 2019-04-16 LAB — PULMONARY FUNCTION TEST
FEF 25-75 Pre: 1.4 L/sec
FEF2575-%Pred-Pre: 56 %
FEV1-%Pred-Pre: 46 %
FEV1-Pre: 1.53 L
FEV1FVC-%Pred-Pre: 107 %
FEV6-%Pred-Pre: 45 %
FEV6-Pre: 1.92 L
FEV6FVC-%Pred-Pre: 105 %
FVC-%Pred-Pre: 42 %
FVC-Pre: 1.94 L
Pre FEV1/FVC ratio: 79 %
Pre FEV6/FVC Ratio: 99 %

## 2019-04-16 LAB — COMPREHENSIVE METABOLIC PANEL
ALT: 57 U/L — ABNORMAL HIGH (ref 0–44)
AST: 81 U/L — ABNORMAL HIGH (ref 15–41)
Albumin: 2.7 g/dL — ABNORMAL LOW (ref 3.5–5.0)
Alkaline Phosphatase: 90 U/L (ref 38–126)
Anion gap: 12 (ref 5–15)
BUN: 23 mg/dL (ref 8–23)
CO2: 28 mmol/L (ref 22–32)
Calcium: 9.6 mg/dL (ref 8.9–10.3)
Chloride: 91 mmol/L — ABNORMAL LOW (ref 98–111)
Creatinine, Ser: 1.01 mg/dL (ref 0.61–1.24)
GFR calc Af Amer: >60 mL/min (ref >60)
GFR calc non Af Amer: >60 mL/min (ref >60)
Glucose, Bld: 232 mg/dL — ABNORMAL HIGH (ref 70–99)
Potassium: 4 mmol/L (ref 3.5–5.1)
Sodium: 131 mmol/L — ABNORMAL LOW (ref 135–145)
Total Bilirubin: 0.7 mg/dL (ref 0.3–1.2)
Total Protein: 6.3 g/dL — ABNORMAL LOW (ref 6.5–8.1)

## 2019-04-16 LAB — POCT I-STAT 7, (LYTES, BLD GAS, ICA,H+H)
Acid-Base Excess: 4 mmol/L — ABNORMAL HIGH (ref 0.0–2.0)
Bicarbonate: 29.8 mmol/L — ABNORMAL HIGH (ref 20.0–28.0)
Calcium, Ion: 1.34 mmol/L (ref 1.15–1.40)
HCT: 31 % — ABNORMAL LOW (ref 39.0–52.0)
Hemoglobin: 10.5 g/dL — ABNORMAL LOW (ref 13.0–17.0)
O2 Saturation: 99 %
Potassium: 4.4 mmol/L (ref 3.5–5.1)
Sodium: 130 mmol/L — ABNORMAL LOW (ref 135–145)
TCO2: 31 mmol/L (ref 22–32)
pCO2 arterial: 51.6 mmHg — ABNORMAL HIGH (ref 32.0–48.0)
pH, Arterial: 7.37 (ref 7.350–7.450)
pO2, Arterial: 140 mmHg — ABNORMAL HIGH (ref 83.0–108.0)

## 2019-04-16 LAB — GLUCOSE, CAPILLARY
Glucose-Capillary: 210 mg/dL — ABNORMAL HIGH (ref 70–99)
Glucose-Capillary: 218 mg/dL — ABNORMAL HIGH (ref 70–99)
Glucose-Capillary: 217 mg/dL — ABNORMAL HIGH (ref 70–99)
Glucose-Capillary: 180 mg/dL — ABNORMAL HIGH (ref 70–99)

## 2019-04-16 LAB — CBC
HCT: 39.9 % (ref 39.0–52.0)
Hemoglobin: 12.9 g/dL — ABNORMAL LOW (ref 13.0–17.0)
MCH: 29.3 pg (ref 26.0–34.0)
MCHC: 32.3 g/dL (ref 30.0–36.0)
MCV: 90.7 fL (ref 80.0–100.0)
Platelets: 275 10*3/uL (ref 150–400)
RBC: 4.4 MIL/uL (ref 4.22–5.81)
RDW: 14.5 % (ref 11.5–15.5)
WBC: 14.8 10*3/uL — ABNORMAL HIGH (ref 4.0–10.5)
nRBC: 0.1 % (ref 0.0–0.2)

## 2019-04-16 LAB — HEPARIN LEVEL (UNFRACTIONATED): Heparin Unfractionated: 0.61 IU/mL (ref 0.30–0.70)

## 2019-04-16 SURGERY — RIGHT/LEFT HEART CATH AND CORONARY ANGIOGRAPHY
Anesthesia: LOCAL

## 2019-04-16 MED ORDER — HYDRALAZINE HCL 20 MG/ML IJ SOLN: 10.0000 mg | INTRAMUSCULAR | Status: AC | PRN

## 2019-04-16 MED ORDER — IOHEXOL 350 MG/ML SOLN
INTRAVENOUS | Status: DC | PRN
  Administered 2019-04-16: 50 mL via INTRA_ARTERIAL

## 2019-04-16 MED ORDER — SODIUM CHLORIDE 0.9% FLUSH
3.0000 mL | Freq: Two times a day (BID) | INTRAVENOUS | Status: DC
  Administered 2019-04-16 – 2019-04-25 (×11): 3 mL via INTRAVENOUS

## 2019-04-16 MED ORDER — VERAPAMIL HCL 2.5 MG/ML IV SOLN
INTRAVENOUS | Status: AC
  Filled 2019-04-16: qty 2

## 2019-04-16 MED ORDER — DIGOXIN 125 MCG PO TABS
0.1250 mg | ORAL_TABLET | Freq: Every day | ORAL | Status: DC
  Administered 2019-04-16 – 2019-04-25 (×10): 0.125 mg via ORAL
  Filled 2019-04-16 (×11): qty 1

## 2019-04-16 MED ORDER — SODIUM CHLORIDE 0.9% FLUSH
3.0000 mL | INTRAVENOUS | Status: DC | PRN
  Administered 2019-04-21: 3 mL via INTRAVENOUS
  Filled 2019-04-16: qty 3

## 2019-04-16 MED ORDER — LABETALOL HCL 5 MG/ML IV SOLN: 10.0000 mg | INTRAVENOUS | Status: AC | PRN

## 2019-04-16 MED ORDER — VERAPAMIL HCL 2.5 MG/ML IV SOLN
INTRAVENOUS | Status: DC | PRN
  Administered 2019-04-16: 10 mL via INTRA_ARTERIAL

## 2019-04-16 MED ORDER — SPIRONOLACTONE 12.5 MG HALF TABLET
12.5000 mg | ORAL_TABLET | Freq: Every day | ORAL | Status: DC
  Administered 2019-04-16 – 2019-04-18 (×3): 12.5 mg via ORAL
  Filled 2019-04-16 (×3): qty 1

## 2019-04-16 MED ORDER — HEPARIN (PORCINE) 25000 UT/250ML-% IV SOLN
1800.0000 [IU]/h | INTRAVENOUS | Status: DC
  Administered 2019-04-18: 1800 [IU]/h via INTRAVENOUS
  Filled 2019-04-16 (×3): qty 250

## 2019-04-16 MED ORDER — MIDAZOLAM HCL 2 MG/2ML IJ SOLN
INTRAMUSCULAR | Status: DC | PRN
  Administered 2019-04-16 (×2): 0.5 mg via INTRAVENOUS

## 2019-04-16 MED ORDER — ONDANSETRON HCL 4 MG/2ML IJ SOLN: 4.0000 mg | Freq: Four times a day (QID) | INTRAMUSCULAR | Status: DC | PRN

## 2019-04-16 MED ORDER — LIDOCAINE HCL (PF) 1 % IJ SOLN
INTRAMUSCULAR | Status: DC | PRN
  Administered 2019-04-16 (×2): 2 mL

## 2019-04-16 MED ORDER — LIDOCAINE HCL (PF) 1 % IJ SOLN
INTRAMUSCULAR | Status: AC
  Filled 2019-04-16: qty 60

## 2019-04-16 MED ORDER — ACETAMINOPHEN 325 MG PO TABS
650.0000 mg | ORAL_TABLET | ORAL | Status: DC | PRN
  Administered 2019-04-17 – 2019-04-22 (×2): 650 mg via ORAL
  Filled 2019-04-16 (×3): qty 2

## 2019-04-16 MED ORDER — MIDAZOLAM HCL 2 MG/2ML IJ SOLN
INTRAMUSCULAR | Status: AC
  Filled 2019-04-16: qty 2

## 2019-04-16 MED ORDER — FENTANYL CITRATE (PF) 100 MCG/2ML IJ SOLN
INTRAMUSCULAR | Status: DC | PRN
  Administered 2019-04-16 (×2): 25 ug via INTRAVENOUS

## 2019-04-16 MED ORDER — HEPARIN (PORCINE) IN NACL 1000-0.9 UT/500ML-% IV SOLN
INTRAVENOUS | Status: DC | PRN
  Administered 2019-04-16 (×2): 500 mL

## 2019-04-16 MED ORDER — SODIUM CHLORIDE 0.9 % IV SOLN: 250.0000 mL | INTRAVENOUS | Status: DC | PRN

## 2019-04-16 MED ORDER — MILRINONE LACTATE IN DEXTROSE 20-5 MG/100ML-% IV SOLN
0.2500 ug/kg/min | INTRAVENOUS | Status: DC
  Administered 2019-04-16 – 2019-04-18 (×3): 0.125 ug/kg/min via INTRAVENOUS
  Administered 2019-04-19 – 2019-04-26 (×16): 0.25 ug/kg/min via INTRAVENOUS
  Filled 2019-04-16 (×19): qty 100

## 2019-04-16 MED ORDER — HEPARIN SODIUM (PORCINE) 1000 UNIT/ML IJ SOLN
INTRAMUSCULAR | Status: DC | PRN
  Administered 2019-04-16: 5000 [IU] via INTRAVENOUS

## 2019-04-16 MED ORDER — FENTANYL CITRATE (PF) 100 MCG/2ML IJ SOLN
INTRAMUSCULAR | Status: AC
  Filled 2019-04-16: qty 2

## 2019-04-16 MED ORDER — HEPARIN (PORCINE) IN NACL 1000-0.9 UT/500ML-% IV SOLN
INTRAVENOUS | Status: AC
  Filled 2019-04-16: qty 1000

## 2019-04-16 MED ORDER — SODIUM CHLORIDE 0.9 % IV SOLN: INTRAVENOUS | Status: AC

## 2019-04-16 SURGICAL SUPPLY — 12 items
CATH 5FR JL3.5 JR4 ANG PIG MP (CATHETERS) ×1 IMPLANT
CATH BALLN WEDGE 5F 110CM (CATHETERS) ×1 IMPLANT
DEVICE RAD COMP TR BAND LRG (VASCULAR PRODUCTS) ×1 IMPLANT
GUIDEWIRE INQWIRE 1.5J.035X260 (WIRE) IMPLANT
INQWIRE 1.5J .035X260CM (WIRE) ×2
KIT HEART LEFT (KITS) ×2 IMPLANT
PACK CARDIAC CATHETERIZATION (CUSTOM PROCEDURE TRAY) ×2 IMPLANT
SHEATH GLIDE SLENDER 4/5FR (SHEATH) ×1 IMPLANT
SHEATH PROBE COVER 6X72 (BAG) ×1 IMPLANT
SHEATH RAIN RADIAL 21G 6FR (SHEATH) ×1 IMPLANT
TRANSDUCER W/STOPCOCK (MISCELLANEOUS) ×2 IMPLANT
TUBING CIL FLEX 10 FLL-RA (TUBING) ×2 IMPLANT

## 2019-04-16 NOTE — Progress Notes (Signed)
[facility] for heparin Indication: chest pain/ACS  Allergies  Allergen Reactions  . Doxazosin Mesylate     Reaction=unknown  . Doxazosin Mesylate Other (See Comments)  . Other Other (See Comments)  . Flomax [Tamsulosin Hcl]     Itching   . Jardiance [Empagliflozin] Other (See Comments)    Nausea, dizziness - Severe (per pt)  . Penicillins Rash and Other (See Comments)    Has patient had a PCN reaction causing immediate rash, facial/tongue/throat swelling, SOB or lightheadedness with hypotension: No Has patient had a PCN reaction causing severe rash involving mucus membranes or skin necrosis: No Has patient had a PCN reaction that required hospitalization No Has patient had a PCN reaction occurring within the last 10 years: No If all of the above answers are "NO", then may proceed with Cephalosporin use.   . Sulfonamide Derivatives Rash  . Trulicity [Dulaglutide] Nausea And Vomiting    N&V, Dizziness    Patient Measurements: Height: 5\' 11"  (180.3 cm) Weight: 256 lb 6.3 oz (116.3 kg) IBW/kg (Calculated) : 75.3 Heparin Dosing Weight: 102 kg  Vital Signs: Temp: 98 F (36.7 C) (08/18 0400) Temp Source: Axillary (08/18 0400) BP: 115/78 (08/18 0800) Pulse Rate: 78 (08/18 0800)  Labs: Recent Labs    04/14/19 0312 04/14/19 0833 04/15/19 0409 04/15/19 1256 04/15/19 2152 04/16/19 0348  HGB 14.9  --  14.0  --   --  12.9*  HCT 44.2  --  41.3  --   --  39.9  PLT 163  --  172  --   --  275  HEPARINUNFRC 0.32  --  0.21* 0.26* 0.44 0.61  CREATININE  --  1.34* 0.94  --   --  1.01    Estimated Creatinine Clearance: 85.7 mL/min (by C-G formula based on SCr of 1.01 mg/dL).  Assessment: 73 y.o. male with NSTEMI for heparin. Plans noted for cath on 8/18  Heparin level is therapeutic this morning at 0.61. CBC stable. No overt bleeding noted.   Now s/p RHC/LHC with severe 3-vessel CAD and ischemic cardiomyopathy (possibly worsened by CAD /  intubation / PNA)  - LVEF < 30%, global hypokinesis - PCWP 16, CO 3.76, CI 1.6, mixed venous O2 sat 52% - Recommended surgical evaluation once medical status is improved  Pharmacy consulted to resume heparin 8 hours post sheath removal. Removal time was documented @ 10:03  Goal of Therapy:  Heparin level 0.3-0.7 units/ml Monitor platelets by anticoagulation protocol: Yes   Plan:  Resume heparin 2000 units/hr starting at 1800 today Daily heparin level, CBC Monitor for bleeding F/u plans for surgical intervention  Vertis Kelch, PharmD PGY2 Cardiology Pharmacy Resident Phone [phone] 04/16/2019       11:03 AM  Please check [email or web address] for unit-specific pharmacist phone numbers

## 2019-04-16 NOTE — Progress Notes (Signed)
[facility] consulted for CABG evaluation. °

## 2019-04-16 NOTE — Interval H&P Note (Signed)
History and Physical Interval Note:  04/16/2019 9:05 AM  Cameron Daniel  has presented today for surgery, with the diagnosis of chest pain.  The various methods of treatment have been discussed with the patient and family. After consideration of risks, benefits and other options for treatment, the patient has consented to  Procedure(s): LEFT HEART CATH AND CORONARY ANGIOGRAPHY (N/A) as a surgical intervention.  The patient's history has been reviewed, patient examined, no change in status, stable for surgery.  I have reviewed the patient's chart and labs.  Questions were answered to the patient's satisfaction.     [name]

## 2019-04-16 NOTE — Consult Note (Signed)
[name],Cameron Daniel             [phone]      Cardiothoracic Surgery Consultation  Reason for Consult: Severe multi-vessel coronary artery disease with severe LV systolic dysfunction Referring Physician: Dr. Chauncey Cruel. Cameron Daniel  Cameron Daniel is an 73 y.o. male.  HPI:   The patient is a 73 year old gentleman with history of hypertension, diabetes, hyperlipidemia, coronary artery disease status post angioplasty in 1985 who was seen in the emergency room on 04/12/2019 for possible dehydration after working outside in the heat.  His wife said they spent about 7 hours in the ER was given intravenous fluids and sent home.  He returned with chills, fatigue, cough, and dyspnea and a CT scan of the chest showed right lower lobe pneumonia.  High-sensitivity troponin was mildly elevated at 136 and subsequent values were 150 and 1,963.  He developed progressive respiratory failure with hypoxia and tachypnea and required intubation in the emergency room.  He was seen by PCCM.  He was started on antibiotics.  A post intubation chest x-ray showed central pulmonary edema.  He was extubated the following day and did well initially and then developed acute respiratory distress requiring increased oxygen and diuresis.  He had a stat echocardiogram which showed ejection fraction of 25 to 30% although it was poor quality.  There is no significant valvular dysfunction.  He underwent cardiac catheterization today showing severe three-vessel coronary disease.  Results of his right heart catheterization are not in the cath note although says that there was mild pulmonary hypertension and normal LVEDP.  The patient's wife is with him today.  He denies any history of chest pain or pressure.  He said that he stays active working in his yard.  He has occasional episodes of exertional shortness of breath but stops to rest and it goes away and he resumes his activity.  He feels like his  energy level has been good.  Past Medical History:  Diagnosis Date   Allergic rhinitis, cause unspecified    Allergy    Anxiety    Arthritis    Cancer ([place])    Cataract    Coronary atherosclerosis of unspecified type of vessel, native or graft    a. Details unclear, someone previously wrote "1985 , arthroplasty x 1."    Depression    PTSD    Diverticulosis of colon (without mention of hemorrhage)    Esophageal reflux    Esophageal stricture    Essential hypertension    Flushing    Hemorrhoids    Hyperlipidemia    Irritable bowel syndrome    Myocardial infarction (HCC)    Nocturia    Obesity, unspecified    Other acquired absence of organ    Other malaise and fatigue    Personal history of colonic polyps    Psychosexual dysfunction with inhibited sexual excitement    Type II or unspecified type diabetes mellitus without mention of complication, not stated as uncontrolled     Past Surgical History:  Procedure Laterality Date   ADENOIDECTOMY     CARPAL TUNNEL RELEASE     left    CHOLECYSTECTOMY     COLON RESECTION     18 inches   COLON SURGERY  2013   [place]  SHOULDER SURGERY     Rt. Shoulder   TONSILLECTOMY      Family History  Problem Relation Age of Onset   Lung cancer Father    Allergic rhinitis Father    Coronary artery disease Mother    Colon cancer Neg Hx    Heart attack Neg Hx    Hypertension Neg Hx    Stroke Neg Hx    Esophageal cancer Neg Hx    Rectal cancer Neg Hx    Stomach cancer Neg Hx    Pancreatic cancer Neg Hx     Social History:  reports that he quit smoking about 35 years ago. He has quit using smokeless tobacco. He reports current alcohol use. He reports that he does not use drugs.  Allergies:  Allergies  Allergen Reactions   Doxazosin Mesylate     Reaction=unknown   Doxazosin Mesylate Other (See  Comments)   Other Other (See Comments)   Flomax [Tamsulosin Hcl]     Itching    Jardiance [Empagliflozin] Other (See Comments)    Nausea, dizziness - Severe (per pt)   Penicillins Rash and Other (See Comments)    Has patient had a PCN reaction causing immediate rash, facial/tongue/throat swelling, SOB or lightheadedness with hypotension: No Has patient had a PCN reaction causing severe rash involving mucus membranes or skin necrosis: No Has patient had a PCN reaction that required hospitalization No Has patient had a PCN reaction occurring within the last 10 years: No If all of the above answers are "NO", then may proceed with Cephalosporin use.    Sulfonamide Derivatives Rash   Trulicity [Dulaglutide] Nausea And Vomiting    N&V, Dizziness    Medications:  I have reviewed the patient's current medications. Prior to Admission:  Facility-Administered Medications Prior to Admission  Medication Dose Route Frequency Provider Last Rate Last Dose   testosterone cypionate (DEPOTESTOSTERONE CYPIONATE) injection 200 mg  200 mg Intramuscular Q14 Days [name], MD   200 mg at 04/02/19 1059   [DISCONTINUED] 0.9 %  sodium chloride infusion  500 mL Intravenous Continuous Irene Shipper, MD       Medications Prior to Admission  Medication Sig Dispense Refill Last Dose   aspirin 81 MG tablet Take 81 mg by mouth every morning.    04/11/2019 at Unknown time   Cholecalciferol (VITAMIN D3) 1000 units CAPS Take 1,000 Units by mouth daily.   04/11/2019 at Unknown time   citalopram (CELEXA) 10 MG tablet Take 2 tablets (20 mg total) by mouth daily. (Patient taking differently: Take 10 mg by mouth daily. ) 180 tablet 3 04/11/2019 at Unknown time   colestipol (COLESTID) 5 g packet Take 5 g by mouth 2 (two) times daily.   04/11/2019 at Unknown time   fexofenadine (ALLEGRA) 180 MG tablet Take 1 tablet (180 mg total) by mouth daily as needed. For allergies (Patient taking differently: Take 180 mg  by mouth daily as needed for allergies. ) 90 tablet 0 unknown   finasteride (PROSCAR) 5 MG tablet Take 1 tablet (5 mg total) by mouth daily. 90 tablet 3 04/11/2019 at Unknown time   glipiZIDE (GLUCOTROL) 5 MG tablet TAKE 1 TABLET (5 MG TOTAL) BY MOUTH DAILY BEFORE BREAKFAST. 90 tablet 1 04/11/2019 at Unknown time   Multiple Vitamin (MULTI-VITAMINS) TABS Take 1 capsule by mouth daily.   04/11/2019 at Unknown time   olmesartan (BENICAR) 20 MG tablet Take 1 tablet (20 mg total) by mouth daily. (Patient taking differently: Take 20  mg by mouth at bedtime. ) 90 tablet 3 04/11/2019 at Unknown time   omega-3 acid ethyl esters (LOVAZA) 1 g capsule Take 1 capsule (1 g total) by mouth daily. (Patient taking differently: Take 1 g by mouth at bedtime. ) 90 capsule 2 04/11/2019 at Unknown time   pantoprazole (PROTONIX) 40 MG tablet TAKE 1 TABLET (40 MG TOTAL) BY MOUTH EVERY MORNING. (Patient taking differently: Take 40 mg by mouth daily as needed (heartburn). ) 90 tablet 3 04/11/2019 at Unknown time   pioglitazone (ACTOS) 30 MG tablet Take 1 tablet (30 mg total) by mouth daily. 90 tablet 1 04/11/2019 at Unknown time   rosuvastatin (CRESTOR) 10 MG tablet Take 1 tablet (10 mg total) by mouth daily. 90 tablet 3 04/11/2019 at Unknown time   testosterone cypionate (DEPOTESTOSTERONE CYPIONATE) 200 MG/ML injection INJECT 1 ML EVERY 14 DAYS 6 mL 3    Scheduled:  aspirin  81 mg Oral Daily   carvedilol  6.25 mg Oral BID WC   Chlorhexidine Gluconate Cloth  6 each Topical Daily   citalopram  10 mg Oral Daily   finasteride  5 mg Oral Daily   insulin aspart  0-20 Units Subcutaneous TID WC   insulin aspart  0-5 Units Subcutaneous QHS   lidocaine  2 patch Transdermal Q24H   mouth rinse  15 mL Mouth Rinse BID   pantoprazole  40 mg Oral q morning - 10a   polyethylene glycol  17 g Oral Daily   rosuvastatin  40 mg Oral q1800   senna-docusate  1 tablet Oral BID   sodium chloride flush  3 mL Intravenous Q12H    Continuous:  sodium chloride     azithromycin Stopped (04/15/19 2240)   cefTRIAXone (ROCEPHIN)  IV Stopped (04/16/19 0030)   heparin     ASN:KNLZJQ chloride, acetaminophen, bisacodyl, hydrALAZINE, ipratropium-albuterol, labetalol, ondansetron (ZOFRAN) IV, sodium chloride flush, traMADol Anti-infectives (From admission, onward)   Start     Dose/Rate Route Frequency Ordered Stop   04/13/19 0000  cefTRIAXone (ROCEPHIN) 2 g in sodium chloride 0.9 % 100 mL IVPB     2 g 200 mL/hr over 30 Minutes Intravenous Every 24 hours 04/12/19 2342 04/17/19 2359   04/12/19 2200  azithromycin (ZITHROMAX) 500 mg in sodium chloride 0.9 % 250 mL IVPB     500 mg 250 mL/hr over 60 Minutes Intravenous Every 24 hours 04/12/19 2123 04/17/19 2159   04/12/19 2115  piperacillin-tazobactam (ZOSYN) IVPB 3.375 g  Status:  Discontinued     3.375 g 100 mL/hr over 30 Minutes Intravenous  Once 04/12/19 2114 04/12/19 2303   04/12/19 2115  vancomycin (VANCOCIN) IVPB 1000 mg/200 mL premix  Status:  Discontinued     1,000 mg 200 mL/hr over 60 Minutes Intravenous  Once 04/12/19 2114 04/12/19 2259   04/12/19 2000  levofloxacin (LEVAQUIN) IVPB 750 mg  Status:  Discontinued     750 mg 100 mL/hr over 90 Minutes Intravenous  Once 04/12/19 1959 04/12/19 2114      Results for orders placed or performed during the hospital encounter of 04/12/19 (from the past 48 hour(s))  Glucose, capillary     Status: Abnormal   Collection Time: 04/14/19  4:37 PM  Result Value Ref Range   Glucose-Capillary 138 (H) 70 - 99 mg/dL  Glucose, capillary     Status: Abnormal   Collection Time: 04/14/19  9:30 PM  Result Value Ref Range   Glucose-Capillary 140 (H) 70 - 99 mg/dL  Comprehensive metabolic panel  Status: Abnormal   Collection Time: 04/15/19  4:09 AM  Result Value Ref Range   Sodium 132 (L) 135 - 145 mmol/L   Potassium 3.9 3.5 - 5.1 mmol/L   Chloride 94 (L) 98 - 111 mmol/L   CO2 26 22 - 32 mmol/L   Glucose, Bld 157 (H) 70 -  99 mg/dL   BUN 24 (H) 8 - 23 mg/dL   Creatinine, Ser 0.94 0.61 - 1.24 mg/dL   Calcium 9.3 8.9 - 10.3 mg/dL   Total Protein 5.6 (L) 6.5 - 8.1 g/dL   Albumin 2.4 (L) 3.5 - 5.0 g/dL   AST 69 (H) 15 - 41 U/L   ALT 44 0 - 44 U/L   Alkaline Phosphatase 68 38 - 126 U/L   Total Bilirubin 0.8 0.3 - 1.2 mg/dL   GFR calc non Af Amer >60 >60 mL/min   GFR calc Af Amer >60 >60 mL/min   Anion gap 12 5 - 15    Comment: Performed at [facility], [place] [address], [place], [place] [address]  CBC     Status: Abnormal   Collection Time: 04/15/19  4:09 AM  Result Value Ref Range   WBC 11.9 (H) 4.0 - 10.5 K/uL   RBC 4.69 4.22 - 5.81 MIL/uL   Hemoglobin 14.0 13.0 - 17.0 g/dL   HCT 41.3 39.0 - 52.0 %   MCV 88.1 80.0 - 100.0 fL   MCH 29.9 26.0 - 34.0 pg   MCHC 33.9 30.0 - 36.0 g/dL   RDW 14.6 11.5 - 15.5 %   Platelets 172 150 - 400 K/uL   nRBC 0.0 0.0 - 0.2 %    Comment: Performed at [facility], [place] [address], [place], [place] [address]  Heparin level (unfractionated)     Status: Abnormal   Collection Time: 04/15/19  4:09 AM  Result Value Ref Range   Heparin Unfractionated 0.21 (L) 0.30 - 0.70 IU/mL    Comment: (NOTE) If heparin results are below expected values, and patient dosage has  been confirmed, suggest follow up testing of antithrombin III levels. Performed at [facility], [place] [address], [address] [place], [place] [address]   Glucose, capillary     Status: Abnormal   Collection Time: 04/15/19  6:58 AM  Result Value Ref Range   Glucose-Capillary 146 (H) 70 - 99 mg/dL  Glucose, capillary     Status: Abnormal   Collection Time: 04/15/19 11:57 AM  Result Value Ref Range   Glucose-Capillary 199 (H) 70 - 99 mg/dL  Glucose, capillary     Status: Abnormal   Collection Time: 04/15/19 12:36 PM  Result Value Ref Range   Glucose-Capillary 212 (H) 70 - 99 mg/dL   Comment 1 Notify RN    Comment 2 Call MD NNP PA CNM    Comment 3 Document in Chart   Heparin level (unfractionated)      Status: Abnormal   Collection Time: 04/15/19 12:56 PM  Result Value Ref Range   Heparin Unfractionated 0.26 (L) 0.30 - 0.70 IU/mL    Comment: (NOTE) If heparin results are below expected values, and patient dosage has  been confirmed, suggest follow up testing of antithrombin III levels. Performed at [facility], [place] [address], [place], [place] [address]   Glucose, capillary     Status: Abnormal   Collection Time: 04/15/19  4:54 PM  Result Value Ref Range   Glucose-Capillary 155 (H) 70 - 99 mg/dL  Comment 1 Notify RN   Glucose, capillary     Status: Abnormal   Collection Time: 04/15/19  9:38 PM  Result Value Ref Range   Glucose-Capillary 183 (H) 70 - 99 mg/dL  Heparin level (unfractionated)     Status: None   Collection Time: 04/15/19  9:52 PM  Result Value Ref Range   Heparin Unfractionated 0.44 0.30 - 0.70 IU/mL    Comment: (NOTE) If heparin results are below expected values, and patient dosage has  been confirmed, suggest follow up testing of antithrombin III levels. Performed at [facility], [place] [address], [place], [place] [address]   Comprehensive metabolic panel     Status: Abnormal   Collection Time: 04/16/19  3:48 AM  Result Value Ref Range   Sodium 131 (L) 135 - 145 mmol/L   Potassium 4.0 3.5 - 5.1 mmol/L   Chloride 91 (L) 98 - 111 mmol/L   CO2 28 22 - 32 mmol/L   Glucose, Bld 232 (H) 70 - 99 mg/dL   BUN 23 8 - 23 mg/dL   Creatinine, Ser 1.01 0.61 - 1.24 mg/dL   Calcium 9.6 8.9 - 10.3 mg/dL   Total Protein 6.3 (L) 6.5 - 8.1 g/dL   Albumin 2.7 (L) 3.5 - 5.0 g/dL   AST 81 (H) 15 - 41 U/L   ALT 57 (H) 0 - 44 U/L   Alkaline Phosphatase 90 38 - 126 U/L   Total Bilirubin 0.7 0.3 - 1.2 mg/dL   GFR calc non Af Amer >60 >60 mL/min   GFR calc Af Amer >60 >60 mL/min   Anion gap 12 5 - 15    Comment: Performed at [facility], [place] [name], [place], [place] [address]  CBC     Status: Abnormal   Collection Time: 04/16/19  3:48 AM  Result Value  Ref Range   WBC 14.8 (H) 4.0 - 10.5 K/uL   RBC 4.40 4.22 - 5.81 MIL/uL   Hemoglobin 12.9 (L) 13.0 - 17.0 g/dL   HCT 39.9 39.0 - 52.0 %   MCV 90.7 80.0 - 100.0 fL   MCH 29.3 26.0 - 34.0 pg   MCHC 32.3 30.0 - 36.0 g/dL   RDW 14.5 11.5 - 15.5 %   Platelets 275 150 - 400 K/uL   nRBC 0.1 0.0 - 0.2 %    Comment: Performed at [facility], [place] [address] [place], [place] [address]  Heparin level (unfractionated)     Status: None   Collection Time: 04/16/19  3:48 AM  Result Value Ref Range   Heparin Unfractionated 0.61 0.30 - 0.70 IU/mL    Comment: (NOTE) If heparin results are below expected values, and patient dosage has  been confirmed, suggest follow up testing of antithrombin III levels. Performed at [facility], [place] [address], [place], [place] [address]   Glucose, capillary     Status: Abnormal   Collection Time: 04/16/19  6:37 AM  Result Value Ref Range   Glucose-Capillary 210 (H) 70 - 99 mg/dL  POCT I-Stat EG7     Status: Abnormal   Collection Time: 04/16/19  9:54 AM  Result Value Ref Range   pH, Ven 7.347 7.250 - 7.430   pCO2, Ven 52.3 44.0 - 60.0 mmHg   pO2, Ven 30.0 (LL) 32.0 - 45.0 mmHg   Bicarbonate 28.7 (H) 20.0 - 28.0 mmol/L   TCO2 30 22 - 32 mmol/L   O2 Saturation 52.0 %  Acid-Base Excess 2.0 0.0 - 2.0 mmol/L   Sodium 131 (L) 135 - 145 mmol/L   Potassium 4.3 3.5 - 5.1 mmol/L   Calcium, Ion 1.34 1.15 - 1.40 mmol/L   HCT 32.0 (L) 39.0 - 52.0 %   Hemoglobin 10.9 (L) 13.0 - 17.0 g/dL   Patient temperature HIDE    Sample type VENOUS    Comment NOTIFIED PHYSICIAN   Glucose, capillary     Status: Abnormal   Collection Time: 04/16/19 11:31 AM  Result Value Ref Range   Glucose-Capillary 218 (H) 70 - 99 mg/dL    Dg Pelvis 1-2 Views  Result Date: 04/15/2019 CLINICAL DATA:  Chronic RIGHT hip pain. EXAM: PELVIS - 1-2 VIEW COMPARISON:  Plain film of the pelvis dated 12/05/2017. FINDINGS: Osseous alignment is normal. No fracture line or displaced fracture  fragment seen. No acute or suspicious osseous finding. No significant degenerative change at either hip joint. Atherosclerotic calcifications within the pelvis and upper thighs. Soft tissues about the pelvis and hips are otherwise unremarkable. IMPRESSION: 1. No acute findings. No significant degenerative change. 2. Vascular calcifications. Electronically Signed   By: Franki Cabot M.D.   On: 04/15/2019 12:52   Dg Chest Port 1 View  Result Date: 04/15/2019 CLINICAL DATA:  73 year old male with a history pneumonia EXAM: PORTABLE CHEST 1 VIEW COMPARISON:  April 13, 2019 FINDINGS: Cardiomediastinal silhouette unchanged in size and contour. Interval removal of the endotracheal tube and gastric tube. Significantly improved airspace opacities of bilateral lungs. No pleural effusion or pneumothorax. No displaced fracture identified. IMPRESSION: Significant improvement in bilateral airspace opacities, with interval removal of endotracheal tube and gastric tube. Electronically Signed   By: Corrie Mckusick D.O.   On: 04/15/2019 07:51    Review of Systems  Constitutional: Positive for fever.  HENT: Negative.   Eyes: Negative.   Respiratory: Positive for cough and shortness of breath. Negative for hemoptysis and sputum production.   Cardiovascular: Negative for chest pain, orthopnea and leg swelling.  Gastrointestinal: Negative.   Genitourinary: Negative.   Musculoskeletal: Negative.   Skin: Negative.   Neurological: Positive for dizziness.  Endo/Heme/Allergies: Negative.   Psychiatric/Behavioral: Negative.    Blood pressure (!) 108/94, pulse 71, temperature (!) 97.4 F (36.3 C), temperature source Axillary, resp. rate 20, height _0  (1.803 m), weight 116.3 kg, SpO2 98 %. Physical Exam  Constitutional: He is oriented to person, place, and time.  Obese gentleman in no distress.  HENT:  Head: Normocephalic and atraumatic.  Mouth/Throat: Oropharynx is clear and moist.  Eyes: Pupils are equal, round,  and reactive to light. EOM are normal.  Neck: Normal range of motion. Neck supple. No JVD present.  Cardiovascular: Normal rate, regular rhythm, normal heart sounds and intact distal pulses.  No murmur heard. Respiratory: Effort normal and breath sounds normal. No respiratory distress. He has no wheezes. He has no rales.  GI: Soft. Bowel sounds are normal. He exhibits no distension. There is no abdominal tenderness.  Musculoskeletal: Normal range of motion.        General: No edema.  Lymphadenopathy:    He has no cervical adenopathy.  Neurological: He is alert and oriented to person, place, and time.  Skin: Skin is warm and dry.  Psychiatric: He has a normal mood and affect.    ECHOCARDIOGRAM REPORT       Patient Name:   Cameron Daniel Date of Exam: 04/13/2019 Medical Rec #:  [id number]          Height:  71.0 in Accession #:    [id number]         Weight:       266.1 lb Date of Birth:  07/31/1946          BSA:          2.38 m Patient Age:    89 years           BP:           115/79 mmHg Patient Gender: M                  HR:           110 bpm. Exam Location:  Inpatient    Procedure: 2D Echo  Indications:    CHF 428.31   History:        Patient has no prior history of Echocardiogram examinations. CAD                 Risk Factors: Hypertension, Diabetes and Dyslipidemia.   Sonographer:    Jannett Celestine RDCS (AE) Referring Phys: [id number] Shellia Cleverly    Sonographer Comments: Technically difficult study due to poor echo windows and suboptimal apical window. Respiratory motion and Image acquisition challenging due to patient body habitus. IMPRESSIONS    1. The left ventricle has severely reduced systolic function, with an ejection fraction of 25-30%. The cavity size was normal. There is mildly increased left ventricular wall thickness. Left ventricular diastolic Doppler parameters are indeterminate.  2. Severe hypokinesis of the left ventricular, entire inferior  wall and anteroseptal wall.  3. Left atrial size was mildly dilated.  4. Small pericardial effusion.  5. The pericardial effusion is posterior to the left ventricle.  6. The mitral valve is abnormal. Mild thickening of the mitral valve leaflet. There is mild to moderate mitral annular calcification present.  7. The aortic valve is tricuspid. Mild sclerosis of the aortic valve. No stenosis of the aortic valve.  8. The aorta is normal unless otherwise noted.  9. The inferior vena cava was dilated in size with <50% respiratory variability. 10. The interatrial septum was not assessed.  SUMMARY   Very technically difficult study, despite definity contrast. LVEF 25-30%, global hypokinesis with severe inferior and anteroseptal hypokinesis, indeterminate diastolic function, elevated LV filling pressure, mild LAE, MAC with mild MR, aortic valve sclerosis, dilated IVC, small posterior pericardial effusion.  FINDINGS  Left Ventricle: The left ventricle has severely reduced systolic function, with an ejection fraction of 25-30%. The cavity size was normal. There is mildly increased left ventricular wall thickness. Left ventricular diastolic Doppler parameters are  indeterminate. Severe hypokinesis of the left ventricular, entire inferior wall and anteroseptal wall. Definity contrast agent was given IV to delineate the left ventricular endocardial borders.  Right Ventricle: The right ventricle has mildly reduced systolic function. The cavity was normal. There is no increase in right ventricular wall thickness.  Left Atrium: Left atrial size was mildly dilated.  Right Atrium: Right atrial size was normal in size. Right atrial pressure is estimated at 10 mmHg.  Interatrial Septum: The interatrial septum was not assessed.  Pericardium: A small pericardial effusion is present. The pericardial effusion is posterior to the left ventricle.  Mitral Valve: The mitral valve is abnormal. Mild  thickening of the mitral valve leaflet. There is mild to moderate mitral annular calcification present. Mitral valve regurgitation is mild by color flow Doppler.  Tricuspid Valve: The tricuspid valve is not well visualized. Tricuspid valve regurgitation was not  visualized by color flow Doppler.  Aortic Valve: The aortic valve is tricuspid Mild sclerosis of the aortic valve. Aortic valve regurgitation was not visualized by color flow Doppler. There is No stenosis of the aortic valve.  Pulmonic Valve: The pulmonic valve was not well visualized. Pulmonic valve regurgitation is not visualized by color flow Doppler.  Aorta: The aorta is normal unless otherwise noted.  Venous: The inferior vena cava is dilated in size with less than 50% respiratory variability.    +--------------+--------++  LEFT VENTRICLE            +--------------+---------++ +--------------+--------++  Diastology                  PLAX 2D                   +--------------+---------++ +--------------+--------++  LV e' lateral: 7.83 cm/s    LVIDd:         4.80 cm    +--------------+---------++ +--------------+--------++  LVIDs:         3.40 cm    +--------------+--------++  LV PW:         1.20 cm    +--------------+--------++  LV IVS:        1.50 cm    +--------------+--------++  LVOT diam:     2.10 cm    +--------------+--------++  LV SV:         60 ml      +--------------+--------++  LV SV Index:   23.99      +--------------+--------++  LVOT Area:     3.46 cm   +--------------+--------++                            +--------------+--------++  +-----------+-------++----------++  LEFT ATRIUM          Index        +-----------+-------++----------++  LA diam:    4.20 cm  1.76 cm/m   +-----------+-------++----------++ +------------+----------+++  RIGHT ATRIUM               +------------+----------+++  RA Pressure: 15.00 mmHg    +------------+----------+++    +-------------+-------++  AORTA                    +-------------+-------++  Ao Root diam: 3.30 cm   +-------------+-------++  +---------------+----------++  TRICUSPID VALVE              +---------------+----------++  Estimated RAP:  15.00 mmHg   +---------------+----------++   +--------------+-------+  SHUNTS                  +--------------+-------+  Systemic Diam: 2.10 cm  +--------------+-------+    Lyman Bishop MD Electronically signed by Lyman Bishop MD Signature Date/Time: 04/13/2019/12:47:00 PM    Physicians  Panel Physicians Referring Physician Case Authorizing Physician  Belva Crome, MD (Primary)    Procedures  RIGHT/LEFT HEART CATH AND CORONARY ANGIOGRAPHY  Conclusion   Severe three-vessel coronary artery disease involving the ostial proximal and mid LAD, the mid RCA, and the mid and distal circumflex.  There is moderate but not angiographically significant left main disease.  Global hypokinesis with reduced EF less than 30% has been noted by echocardiography.  Overall picture is consistent with ischemic cardiomyopathy, possibly worsened EF with stunned myocardium in the setting of severe three-vessel CAD and increased demand caused by pneumonia and intubation.  Hemodynamics suggest that volume status is stable.  Cardiac output is low due to left  ventricular hypocontractility.  RECOMMENDATIONS:   Strong consideration for surgical revascularization once medical status is improved.  Guideline directed therapy for left ventricular systolic dysfunction which should also include anti-ischemic therapy.  Preventive therapy with high intensity statin therapy.  Consider advanced heart failure team assessment to help optimize clinical condition.  Debate whether viability imaging is necessary.  Recommendations  Antiplatelet/Anticoag Recommend Aspirin 36m daily for moderate CAD.  Surgeon Notes    04/16/2019 10:22 AM CV Procedure signed by SBelva Crome MD  Indications  Non-ST elevation (NSTEMI)  myocardial infarction ([facility] [I21.4 (ICD-10-CM)]  Coronary artery disease of native artery of native heart with stable angina pectoris ([place] [I25.118 (ICD-10-CM)]  Procedural Details  Technical Details The right radial area was sterilely prepped and draped. Intravenous sedation with Versed and fentanyl was administered. 1% Xylocaine was infiltrated to achieve local analgesia. Using real-time vascular ultrasound, a double wall stick with an angiocath was utilized to obtain intra-arterial access. A VUS image was saved for the permanent record.The modified Seldinger technique was used to place a 95F " Slender" sheath in the right radial artery. Weight based heparin was administered. Coronary angiography was done using 5 F catheters. Right coronary angiography was performed with a JR4. Left ventricular hemodymic recordings and angiography was done using the JR 4 catheter and hand injection. Left coronary angiography was performed with a JL 3.5 cm.  Right heart catheterization was performed real-time vascular ultrasound with direct puncture of the antecubital.  Local 1% Xylocaine infiltration was performed prior to stick.  A 5 French Slender sheath was placed using the Seldinger technique. After sheath insertion, right heart cath was performed using a 5 French balloon tipped catheter and fluoroscopic guidance. Pressures were recorded in each chamber and in the pulmonary capillary wedge position.. The main pulmonary artery O2 saturation was sampled.   Hemostasis was achieved using a pneumatic band.  During this procedure the patient is administered a total of Versed 1 mg and Fentanyl 50 mg to achieve and maintain moderate conscious sedation.  The patient's heart rate, blood pressure, and oxygen saturation are monitored continuously during the procedure. The period of conscious sedation is 19 minutes, of which I was present face-to-face 100% of this time.  Total contrast used, 50 cc. Estimated blood loss <50 mL.    During this procedure medications were administered to achieve and maintain moderate conscious sedation while the patient's heart rate, blood pressure, and oxygen saturation were continuously monitored and I was present face-to-face 100% of this time.  Medications (Filter: Administrations occurring from 04/16/19 0859 to 04/16/19 1010) (important)  Continuous medications are totaled by the amount administered until 04/16/19 1010.  Medication Rate/Dose/Volume Action  Date Time   midazolam (VERSED) injection (mg) 0.5 mg Given 04/16/19 0942   Total dose as of 04/16/19 1010 0.5 mg Given 0948   1 mg        fentaNYL (SUBLIMAZE) injection (mcg) 25 mcg Given 04/16/19 0942   Total dose as of 04/16/19 1010 25 mcg Given 0948   50 mcg        lidocaine (PF) (XYLOCAINE) 1 % injection (mL) 2 mL Given 04/16/19 0944   Total dose as of 04/16/19 1010 2 mL Given 0945   4 mL        Radial Cocktail/Verapamil only (mL) 10 mL Given 04/16/19 0944   Total dose as of 04/16/19 1010        10 mL        heparin injection (Units) 5,000 Units Given  04/16/19 0953   Total dose as of 04/16/19 1010        5,000 Units        Heparin (Porcine) in NaCl 1000-0.9 UT/500ML-% SOLN (mL) 500 mL Given 04/16/19 1004   Total dose as of 04/16/19 1010 500 mL Given 1004   1,000 mL        iohexol (OMNIPAQUE) 350 MG/ML injection (mL) 50 mL Given 04/16/19 1004   Total dose as of 04/16/19 1010        50 mL        aspirin chewable tablet 81 mg (mg) *Not included in total [facility] Hold 04/16/19 0859   Dosing weight:  115.7 kg        Total dose as of 04/16/19 1010        Cannot be calculated        azithromycin (ZITHROMAX) 500 mg in sodium chloride 0.9 % 250 mL IVPB (mL/hr) *Not included in total [facility] Hold 04/16/19 0859   Total dose as of 04/16/19 1010        Cannot be calculated        bisacodyl (DULCOLAX) EC tablet 5 mg (mg) *Not included in total [facility] Hold 04/16/19 0859   Dosing weight:  115.7 kg        Total dose as of 04/16/19 1010         Cannot be calculated        carvedilol (COREG) tablet 6.25 mg (mg) *Not included in total [facility] Hold 04/16/19 0859   Dosing weight:  115.5 kg        Total dose as of 04/16/19 1010        Cannot be calculated        cefTRIAXone (ROCEPHIN) 2 g in sodium chloride 0.9 % 100 mL IVPB (mL/hr) *Not included in total [facility] Hold 04/16/19 0859   Total dose as of 04/16/19 1010        Cannot be calculated        Chlorhexidine Gluconate Cloth 2 % PADS 6 each (each) *Not included in total [facility] Hold 04/16/19 0859   Dosing weight:  120.7 kg *Not included in total Automatically Held 1000   Total dose as of 04/16/19 1010        Cannot be calculated        citalopram (CELEXA) tablet 10 mg (mg) *Not included in total [facility] Hold 04/16/19 0859   Dosing weight:  115.7 kg *Not included in total Automatically Held 1000   Total dose as of 04/16/19 1010        Cannot be calculated        finasteride (PROSCAR) tablet 5 mg (mg) *Not included in total [facility] Hold 04/16/19 0859   Total dose as of 04/16/19 1010 *Not included in total Automatically Held 1000   Cannot be calculated        insulin aspart (novoLOG) injection 0-20 Units (Units) *Not included in total [facility] Hold 04/16/19 0859   Dosing weight:  115.7 kg        Total dose as of 04/16/19 1010        Cannot be calculated        insulin aspart (novoLOG) injection 0-5 Units (Units) *Not included in total [facility] Hold 04/16/19 0859   Dosing weight:  115.7 kg        Total dose as of 04/16/19 1010        Cannot be calculated        ipratropium-albuterol (DUONEB)  0.5-2.5 (3) MG/3ML nebulizer solution 3 mL (mL) *Not included in total [facility] Hold 04/16/19 0859   Dosing weight:  115.7 kg        Total dose as of 04/16/19 1010        Cannot be calculated        lidocaine (LIDODERM) 5 % 2 patch (patch) *Not included in total [facility] Hold 04/16/19 0859   Dosing weight:  115.5 kg *Not included in total Automatically Held 1000   Total dose as of 04/16/19 1010        Cannot be calculated          MEDLINE mouth rinse (mL) *Not included in total [facility] Hold 04/16/19 0859   Dosing weight:  120.7 kg *Not included in total Automatically Held 1000   Total dose as of 04/16/19 1010        Cannot be calculated        pantoprazole (PROTONIX) EC tablet 40 mg (mg) *Not included in total [facility] Hold 04/16/19 0859   Total dose as of 04/16/19 1010 *Not included in total Automatically Held 1000   Cannot be calculated        polyethylene glycol (MIRALAX / GLYCOLAX) packet 17 g (g) *Not included in total [facility] Hold 04/16/19 0859   Dosing weight:  115.7 kg *Not included in total Automatically Held 1000   Total dose as of 04/16/19 1010        Cannot be calculated        rosuvastatin (CRESTOR) tablet 40 mg (mg) *Not included in total [facility] Hold 04/16/19 0859   Dosing weight:  115.7 kg        Total dose as of 04/16/19 1010        Cannot be calculated        senna-docusate (Senokot-S) tablet 1 tablet (tablet) *Not included in total [facility] Hold 04/16/19 0859   Dosing weight:  115.7 kg *Not included in total Automatically Held 1000   Total dose as of 04/16/19 1010        Cannot be calculated        traMADol (ULTRAM) tablet 50 mg (mg) *Not included in total [facility] Hold 04/16/19 0859   Dosing weight:  115.5 kg        Total dose as of 04/16/19 1010        Cannot be calculated        0.9 % sodium chloride infusion (mL/hr) *10 mL/hr Transfusing/Transfer 04/16/19 0916   Dosing weight:  115.5 kg        Total dose as of 04/16/19 1010        0 mL        *Administration not included in total        heparin ADULT infusion 100 units/mL (25000 units/229m sodium chloride 0.45%) (Units/hr)  Stopped 04/16/19 0900   Dosing weight:  120.7 kg        Total dose as of 04/16/19 1010        Cannot be calculated        ondansetron (ZOFRAN) injection 4 mg (mg) *Not included in total [facility] Hold 04/16/19 0859   Total dose as of 04/16/19 1010        Cannot be calculated        Sedation Time  Sedation Time Physician-1: 18 minutes 38  seconds  Coronary Findings  Diagnostic Dominance: Right Left Main  Ost LM to Dist LM lesion 40% stenosed  Ost LM to Dist LM lesion is  40% stenosed.  Left Anterior Descending  Ost LAD lesion 65% stenosed  Ost LAD lesion is 65% stenosed.  Prox LAD to Mid LAD lesion 90% stenosed  Prox LAD to Mid LAD lesion is 90% stenosed.  Mid LAD lesion 95% stenosed  Mid LAD lesion is 95% stenosed.  Dist LAD lesion 60% stenosed  Dist LAD lesion is 60% stenosed.  Left Circumflex  Mid Cx lesion 85% stenosed  Mid Cx lesion is 85% stenosed.  Dist Cx lesion 95% stenosed  Dist Cx lesion is 95% stenosed.  First Obtuse Marginal Branch  Vessel is small in size.  Second Obtuse Marginal Branch  Vessel is small in size.  Third Obtuse Marginal Branch  Vessel is large in size.  Right Coronary Artery  Mid RCA lesion 85% stenosed  Mid RCA lesion is 85% stenosed.  Intervention  No interventions have been documented. Right Heart  Right Heart Pressures Hemodynamic findings consistent with mild pulmonary hypertension. LV EDP is normal.  Wall Motion  Resting    LVEF less than 30% by echo with global hypokinesis noted.        Coronary Diagrams  Diagnostic Dominance: Right     Assessment/Plan:  This 73 year old gentleman presented with right lower lobe pneumonia and ruled in for non-ST segment elevation MI and subsequently developed progressive hypoxemia and respiratory failure requiring intubation in the emergency room with a post intubation x-ray showing central pulmonary edema.  His echocardiogram shows an ejection fraction of 25% although it was a poor quality study.  Cardiac catheterization shows severe multivessel coronary disease with high-grade calcified stenoses in the LAD, left circumflex, and right coronary arteries.  His coronary vessels are diffusely calcified and have a diabetic vessel appearance.  His vessels do appear graftable distally although I suspect they will be diffusely diseased.   It is not clear what his baseline ejection fraction was before this started since he has not had any cardiac assessment in years.  I do not know if he has had a low ejection fraction long-term or a recent drop due to acute ischemia and infarction or a possible nonischemic component due to stress.  I think CABG is probably going to be his best long-term treatment option but I think getting him in the best preoperative shape possible will decrease his operative risk.  I discussed the case with Dr. [name] and he has agreed to see the patient for assessment and management of his heart failure and low ejection fraction.  If we do bypass surgery it would not be until Friday at the earliest which will allow time for further assessment and tune up.  I reviewed the catheterization and echo findings with the patient and his wife and answered all of their questions at this time.  I spent 40 minutes performing this consultation and > 50% of this time was spent face to face counseling and coordinating the care of this patient's severe multivessel coronary disease with severe left ventricular systolic dysfunction.  Gaye Pollack 04/16/2019, 2:52 PM

## 2019-04-16 NOTE — Progress Notes (Signed)
PROGRESS NOTE    Patient: Cameron Daniel                            PCP: [name], MD                    DOB: 1945-12-11            DOA: 04/12/2019 ERX:[id number]             DOS: 04/16/2019, 11:33 AM   LOS: 4 days   Date of Service: The patient was seen and examined on 04/16/2019  Subjective:   The patient was seen and examined this morning, stable no acute distress, went for cardiac catheterization this morning. Currently stable.  Brief Narrative:  8/14 Admit  73 yo male with past medical history ofCAD, HTN, DM, Colon polyps, IBS, HLD, GERD with esophageal stricture, Diverticulosis, Depression, Anxiety former smoker  presented with concern for dehydration after working outside.  Had cough, dyspnea, fatigue, chills.  CT chest showed RLL PNA and had elevated troponin.  Developed worsening hypoxia and required intubation in ICU.   8/15 extubate, cardiology consult, transfer from [facility] to 2h  CTA chest 8/14 >> Rt lower lobe consolidation, atherosclerosis Echo 8/5 >> EF 25 to 30%  Assessment & Plan:   Principal Problem:   Non-ST elevation (NSTEMI) myocardial infarction [facility]) Active Problems:   Coronary atherosclerosis   Community acquired pneumonia of right lower lobe of lung ([place])   Acute respiratory failure ([place])   AKI (acute kidney injury) ([place])   Acute systolic HF (heart failure) ([place])  Acute hypoxic respiratory failure  -Was suspected to be secondary to pneumonia, and acute MI CXR 8/17 with improved bilateral ASD -Patient was intubated on 814 subsequently extubated on 815 -PCCM was following closely -Supplemental O2 for SpO2 > 92% -PRN BiPAP -continue to wean off accordingly -PRN albuterol  -PRN CXR - continue azithromycin, ceftriaxone for 5 day course, suspected CAP  CTA chest 8/14 >> Rt lower lobe consolidation, atherosclerosis Echo 8/5 >> EF 25 to 30%  Suspected right lower lobe pneumonia -We will follow-up with cultures,  -continue initiated  azithromycin, ceftriaxone for 5 days.  Rocephin 8/14>>> Azithro 8/14>>>  Sputum/BC x 2 8/14 >>>  negative to date COVID 8/14 >>> NEG     Non-ST elevation myocardial infarction -Status post cardiac cath 04/16/2019 -Status post TTE on 04/13/2019 revealing severe LV dysfunction, ejection fraction 25-30%, regional wall motion abnormality -Cardiology following very closely -Continue supportive therapy, aspirin, heparin, nitroglycerin, statins, beta-blockers  Cardiomyopathy -systolic congestive heart failure -With significant reduced ejection fraction 25-30%,  -cardiology following -Per cardiology recommendation continue carvedilol, 6.25 mg p.o. twice daily,  -Anticipating additional medication including diuretics,  post cardiac cath  -We will monitor daily weight, proBNP -Currently holding diuretics due to elevated creatinine  Acute renal insufficiency -Monitoring BUN/creatinine closely, holding diuretics, - Cr. 1.34 -> 0.94->1.01. -Try to avoid all nephrotoxins  .Right hip OA  - started on tramadol for pain control  Constipation/GERD with history of esophageal stricture - BID senna, qD miralax, PRN dulcolax - Continue Protonix  DM type II. - SSI - hold outpt glucotrol, actos  Hx of Depression -Stable, continue Celexa  BPH -Stable, continue Proscar  Hyponatremia, mild -Improving,  -Monitoring, status post normal saline hydration and ICU    Cultures; Sputum/blood cultures 04/12/2019 >>> negative to date SARS-CoV-2 >> negative  Antimicrobials: Rocephin 8/14>>> Azithro 8/14>>>    DVT prophylaxis: Heparin  SQ Code Status:   Code Status: Full Code Family Communication: No family member present at bedside- attempt will be made to update daily The above findings and plan of care has been discussed with patient in detail,  they expressed understanding and agreement of above. Disposition Plan:  >3 days Admission status:  INPATIEN -   Consultants:  Cardiology/PCCM  Procedures:   TTE; 04/13/2019   1. The left ventricle has severely reduced systolic function, with an ejection fraction of 25-30%. The cavity size was normal. There is mildly increased left ventricular wall thickness. Left ventricular diastolic Doppler parameters are indeterminate. 2. Severe hypokinesis of the left ventricular, entire inferior wall and anteroseptal wall. 3. Left atrial size was mildly dilated. 4. Small pericardial effusion. 5. The pericardial effusion is posterior to the left ventricle. 6. The mitral valve is abnormal. Mild thickening of the mitral valve leaflet. There is mild to moderate mitral annular calcification present. 7. The aortic valve is tricuspid. Mild sclerosis of the aortic valve. No stenosis of the aortic valve. 8. The aorta is normal unless otherwise noted. 9. The inferior vena cava was dilated in size with <50% respiratory variability. 10. The interatrial septum was not assessed.  Antimicrobials:  Anti-infectives (From admission, onward)   Start     Dose/Rate Route Frequency Ordered Stop   04/13/19 0000  cefTRIAXone (ROCEPHIN) 2 g in sodium chloride 0.9 % 100 mL IVPB     2 g 200 mL/hr over 30 Minutes Intravenous Every 24 hours 04/12/19 2342 04/17/19 2359   04/12/19 2200  azithromycin (ZITHROMAX) 500 mg in sodium chloride 0.9 % 250 mL IVPB     500 mg 250 mL/hr over 60 Minutes Intravenous Every 24 hours 04/12/19 2123 04/17/19 2159   04/12/19 2115  piperacillin-tazobactam (ZOSYN) IVPB 3.375 g  Status:  Discontinued     3.375 g 100 mL/hr over 30 Minutes Intravenous  Once 04/12/19 2114 04/12/19 2303   04/12/19 2115  vancomycin (VANCOCIN) IVPB 1000 mg/200 mL premix  Status:  Discontinued     1,000 mg 200 mL/hr over 60 Minutes Intravenous  Once 04/12/19 2114 04/12/19 2259   04/12/19 2000  levofloxacin (LEVAQUIN) IVPB 750 mg  Status:  Discontinued     750 mg 100 mL/hr over 90 Minutes Intravenous  Once 04/12/19 1959 04/12/19 2114        Medication:  . aspirin  81 mg Oral Daily  . carvedilol  6.25 mg Oral BID WC  . Chlorhexidine Gluconate Cloth  6 each Topical Daily  . citalopram  10 mg Oral Daily  . finasteride  5 mg Oral Daily  . insulin aspart  0-20 Units Subcutaneous TID WC  . insulin aspart  0-5 Units Subcutaneous QHS  . lidocaine  2 patch Transdermal Q24H  . mouth rinse  15 mL Mouth Rinse BID  . pantoprazole  40 mg Oral q morning - 10a  . polyethylene glycol  17 g Oral Daily  . rosuvastatin  40 mg Oral q1800  . senna-docusate  1 tablet Oral BID  . sodium chloride flush  3 mL Intravenous Q12H    sodium chloride, acetaminophen, bisacodyl, hydrALAZINE, ipratropium-albuterol, labetalol, ondansetron (ZOFRAN) IV, sodium chloride flush, traMADol   Objective:   Vitals:   04/16/19 0600 04/16/19 0700 04/16/19 0800 04/16/19 0918  BP: 127/75 115/71 115/78   Pulse: 80 80 78   Resp: (!) 24 (!) 26 (!) 24   Temp:      TempSrc:      SpO2: 94% 95% 95%  96%  Weight:      Height:        Intake/Output Summary (Last 24 hours) at 04/16/2019 1133 Last data filed at 04/16/2019 0800 Gross per 24 hour  Intake 1141.25 ml  Output 925 ml  Net 216.25 ml   Filed Weights   04/15/19 0250 04/15/19 1938 04/16/19 0354  Weight: 115.5 kg 116.3 kg 116.3 kg     Examination:   Physical Exam  Constitution:  Alert, cooperative, no distress,  Appears calm and comfortable  Psychiatric: Normal and stable mood and affect, cognition intact,   HEENT: Normocephalic, PERRL, otherwise with in Normal limits  Chest:Chest symmetric Cardio vascular:  S1/S2, RRR, No murmure, No Rubs or Gallops  pulmonary: Clear to auscultation bilaterally, respirations unlabored, negative wheezes / crackles Abdomen: Soft, non-tender, non-distended, bowel sounds,no masses, no organomegaly Muscular skeletal: Limited exam - in bed, able to move all 4 extremities, Normal strength,  Neuro: CNII-XII intact. , normal motor and sensation, reflexes intact   Extremities: No pitting edema lower extremities, +2 pulses  Skin: Dry, warm to touch, negative for any Rashes, No open wounds Wounds: per nursing documentation  LABs:  CBC Latest Ref Rng & Units 04/16/2019 04/15/2019 04/14/2019  WBC 4.0 - 10.5 K/uL 14.8(H) 11.9(H) 14.2(H)  Hemoglobin 13.0 - 17.0 g/dL 12.9(L) 14.0 14.9  Hematocrit 39.0 - 52.0 % 39.9 41.3 44.2  Platelets 150 - 400 K/uL 275 172 163   CMP Latest Ref Rng & Units 04/16/2019 04/15/2019 04/14/2019  Glucose 70 - 99 mg/dL 232(H) 157(H) 165(H)  BUN 8 - 23 mg/dL 23 24(H) 28(H)  Creatinine 0.61 - 1.24 mg/dL 1.01 0.94 1.34(H)  Sodium 135 - 145 mmol/L 131(L) 132(L) 132(L)  Potassium 3.5 - 5.1 mmol/L 4.0 3.9 3.6  Chloride 98 - 111 mmol/L 91(L) 94(L) 94(L)  CO2 22 - 32 mmol/L 28 26 26   Calcium 8.9 - 10.3 mg/dL 9.6 9.3 9.2  Total Protein 6.5 - 8.1 g/dL 6.3(L) 5.6(L) -  Total Bilirubin 0.3 - 1.2 mg/dL 0.7 0.8 -  Alkaline Phos 38 - 126 U/L 90 68 -  AST 15 - 41 U/L 81(H) 69(H) -  ALT 0 - 44 U/L 57(H) 44 -     SIGNED: Deatra James, MD, FACP, FHM. [facility],  Pager [phone]  If 7PM-7AM, please contact night-coverage [email or web address] Ota [facility] 04/16/2019, 11:33 AM

## 2019-04-16 NOTE — Progress Notes (Signed)
Inpatient Diabetes Program Recommendations  AACE/ADA: New Consensus Statement on Inpatient Glycemic Control   Target Ranges:  Prepandial:   less than 140 mg/dL      Peak postprandial:   less than 180 mg/dL (1-2 hours)      Critically ill patients:  140 - 180 mg/dL   Results for Cameron Daniel, Cameron Daniel (MRN [id number]) as of 04/16/2019 15:24  Ref. Range 04/15/2019 06:58 04/15/2019 11:57 04/15/2019 12:36 04/15/2019 16:54 04/15/2019 21:38 04/16/2019 06:37 04/16/2019 11:31  Glucose-Capillary Latest Ref Range: 70 - 99 mg/dL 146 (H) 199 (H) 212 (H) 155 (H) 183 (H) 210 (H) 218 (H)   Review of Glycemic Control  Diabetes history: DM2 Outpatient Diabetes medications: Glipizide 5 mg QAM, Actos 30 mg daily Current orders for Inpatient glycemic control: Novolog 0-20 units TID with meals, Novolog 0-5 units QHS  Inpatient Diabetes Program Recommendations:   Insulin - Basal: Please consider ordering Lantus 5 units QHS.  Insulin - Meal Coverage: Please consider ordering Novolog 3 units TID with meals for meal coverage if patient eats at least 50% of meals.  Thanks, Barnie Alderman, RN, MSN, CDE Diabetes Coordinator Inpatient Diabetes Program [phone] (Team Pager from 8am to 5pm)

## 2019-04-16 NOTE — Progress Notes (Signed)
[place] for heparin Indication: chest pain/ACS  Allergies  Allergen Reactions  . Doxazosin Mesylate     Reaction=unknown  . Doxazosin Mesylate Other (See Comments)  . Other Other (See Comments)  . Flomax [Tamsulosin Hcl]     Itching   . Jardiance [Empagliflozin] Other (See Comments)    Nausea, dizziness - Severe (per pt)  . Penicillins Rash and Other (See Comments)    Has patient had a PCN reaction causing immediate rash, facial/tongue/throat swelling, SOB or lightheadedness with hypotension: No Has patient had a PCN reaction causing severe rash involving mucus membranes or skin necrosis: No Has patient had a PCN reaction that required hospitalization No Has patient had a PCN reaction occurring within the last 10 years: No If all of the above answers are "NO", then may proceed with Cephalosporin use.   . Sulfonamide Derivatives Rash  . Trulicity [Dulaglutide] Nausea And Vomiting    N&V, Dizziness    Patient Measurements: Height: 5\' 11"  (180.3 cm) Weight: 256 lb 6.3 oz (116.3 kg) IBW/kg (Calculated) : 75.3 Heparin Dosing Weight: 102 kg  Vital Signs: Temp: 98 F (36.7 C) (08/18 0400) Temp Source: Axillary (08/18 0400) BP: 115/71 (08/18 0700) Pulse Rate: 80 (08/18 0700)  Labs: Recent Labs    04/14/19 0312 04/14/19 0833 04/15/19 0409 04/15/19 1256 04/15/19 2152 04/16/19 0348  HGB 14.9  --  14.0  --   --  12.9*  HCT 44.2  --  41.3  --   --  39.9  PLT 163  --  172  --   --  275  HEPARINUNFRC 0.32  --  0.21* 0.26* 0.44 0.61  CREATININE  --  1.34* 0.94  --   --  1.01    Estimated Creatinine Clearance: 85.7 mL/min (by C-G formula based on SCr of 1.01 mg/dL).  Assessment: 73 y.o. male with NSTEMI for heparin. Plans noted for cath on 8/18  Heparin level is therapeutic this morning at 0.61. CBC stable. No overt bleeding noted.   Goal of Therapy:  Heparin level 0.3-0.7 units/ml Monitor platelets by anticoagulation protocol:  Yes   Plan:  Continue Heparin to 2000 units/hr Daily heparin level, CBC Follow up post-cath today  Vertis Kelch, PharmD PGY2 Cardiology Pharmacy Resident Phone [phone] 04/16/2019       8:34 AM  Please check [email or web address] for unit-specific pharmacist phone numbers

## 2019-04-16 NOTE — Consult Note (Addendum)
Advanced Heart Failure Team Consult Note   Primary Physician: [name], MD PCP-Cardiologist:  [name], MD  HF MD: Dr [name]  Reason for Consultation: Heart Failure   HPI:    Cameron Daniel is seen today for evaluation of heart failure at the request of Dr Cyndia Bent.   Mr Cameron Daniel is a 73 year old with a history of CAD, MI 30 years ago with stent to RCA.   Presented to [facility] on 8/14 with increased SOB, chills, and cough. Had acute hypoxic respiratory failure and required intubation. CTA was concerning for RLL PNA. Placed on azithromycin + ceftriaxone. Pertinent admission labs included: HS Troponin  136>150> 1963, Covid 19 negative, K 4.1, creatinine 1.05, BNP 357. Lactic Acid 2.7> 4.6. he was extubated on 8/15 but again decompensated and was placed on Bipap. Stat ECHO performed and showed reduced EF with WMA. Cardiology consulted and he was set up for cath. Had RHC/LHC today with severe 3V disease, PCWP 16 mmhg, CO 3.76, CI 1.6. Advanced HF team consulted to optimize for CABG.   At baseline runs a [employer] with his sons. Says he mostly mows the grass but can walk around the yards and up hills with mild DOE.   RHC  RA 8  RV 39/10  PA 30/23 27 PCWP 16 PAPi < 1.0 (assuming if PA 39/23 ---> papi is 2 RA/PCWP - 0.5   CO 3.8  CI 1.6   Echo 04/13/2019   1. The left ventricle has severely reduced systolic function, with an ejection fraction of 25-30%. The cavity size was normal. There is mildly increased left ventricular wall thickness. Left ventricular diastolic Doppler parameters are indeterminate.  2. Severe hypokinesis of the left ventricular, entire inferior wall and anteroseptal wall.  3. Left atrial size was mildly dilated.  4. Small pericardial effusion.  5. The pericardial effusion is posterior to the left ventricle.  6. The mitral valve is abnormal. Mild thickening of the mitral valve leaflet. There is mild to moderate mitral annular calcification  present.  7. The aortic valve is tricuspid. Mild sclerosis of the aortic valve. No stenosis of the aortic valve.  Review of Systems: [y] = yes, [ ]  = no    General: Weight gain [ ] ; Weight loss [ ] ; Anorexia [ ] ; Fatigue Blue.Reese ]; Fever [ ] ; Chills [ ] ; Weakness Blue.Reese ]   Cardiac: Chest pain/pressure [ ] ; Resting SOB [ ] ; Exertional SOB [ y]; Orthopnea [ ] ; Pedal Edema [ ] ; Palpitations [ ] ; Syncope [ ] ; Presyncope [ ] ; Paroxysmal nocturnal dyspnea[ ]    Pulmonary: Cough Blue.Reese ]; Wheezing[ ] ; Hemoptysis[ ] ; Sputum Blue.Reese ]; Snoring [ ]    GI: Vomiting[ ] ; Dysphagia[ ] ; Melena[ ] ; Hematochezia [ ] ; Heartburn[ ] ; Abdominal pain [ ] ; Constipation [ ] ; Diarrhea [ ] ; BRBPR [ ]    GU: Hematuria[ ] ; Dysuria [ ] ; Nocturia[ ]    Vascular: Pain in legs with walking [ ] ; Pain in feet with lying flat [ ] ; Non-healing sores [ ] ; Stroke [ ] ; TIA [ ] ; Slurred speech [ ] ;   Neuro: Headaches[ ] ; Vertigo[ ] ; Seizures[ ] ; Paresthesias[ ] ;Blurred vision [ ] ; Diplopia [ ] ; Vision changes [ ]    Ortho/Skin: Arthritis Blue.Reese ]; Joint pain Blue.Reese ]; Muscle pain [ ] ; Joint swelling [ ] ; Back Pain [ ] ; Rash [ ]    Psych: Depression[ ] ; Anxiety[ ]    Heme: Bleeding problems [ ] ; Clotting disorders [ ] ; Anemia [ ]    Endocrine: Diabetes [ ] ;  Thyroid dysfunction[ ]   Home Medications Prior to Admission medications   Medication Sig Start Date End Date Taking? Authorizing Provider  aspirin 81 MG tablet Take 81 mg by mouth every morning.    Yes [provider]  Cholecalciferol (VITAMIN D3) 1000 units CAPS Take 1,000 Units by mouth daily.   Yes [provider]  citalopram (CELEXA) 10 MG tablet Take 2 tablets (20 mg total) by mouth daily. Patient taking differently: Take 10 mg by mouth daily.  02/27/19  Yes [name], MD  colestipol (COLESTID) 5 g packet Take 5 g by mouth 2 (two) times daily.   Yes [provider]  fexofenadine (ALLEGRA) 180 MG tablet Take 1 tablet (180 mg total) by mouth daily as needed. For  allergies Patient taking differently: Take 180 mg by mouth daily as needed for allergies.  05/28/18  Yes Bobbitt, Sedalia Muta, MD  finasteride (PROSCAR) 5 MG tablet Take 1 tablet (5 mg total) by mouth daily. 02/27/19  Yes [name], MD  glipiZIDE (GLUCOTROL) 5 MG tablet TAKE 1 TABLET (5 MG TOTAL) BY MOUTH DAILY BEFORE BREAKFAST. 01/07/19  Yes [name], MD  Multiple Vitamin (MULTI-VITAMINS) TABS Take 1 capsule by mouth daily.   Yes [provider]  olmesartan (BENICAR) 20 MG tablet Take 1 tablet (20 mg total) by mouth daily. Patient taking differently: Take 20 mg by mouth at bedtime.  02/19/18  Yes [name], MD  omega-3 acid ethyl esters (LOVAZA) 1 g capsule Take 1 capsule (1 g total) by mouth daily. Patient taking differently: Take 1 g by mouth at bedtime.  11/03/17  Yes Dorothy Spark, MD  pantoprazole (PROTONIX) 40 MG tablet TAKE 1 TABLET (40 MG TOTAL) BY MOUTH EVERY MORNING. Patient taking differently: Take 40 mg by mouth daily as needed (heartburn).  10/04/18  Yes [name], MD  pioglitazone (ACTOS) 30 MG tablet Take 1 tablet (30 mg total) by mouth daily. 03/12/19  Yes [name], MD  rosuvastatin (CRESTOR) 10 MG tablet Take 1 tablet (10 mg total) by mouth daily. 11/26/18  Yes [name], MD  fluticasone [facility]) 50 MCG/ACT nasal spray SPRAY 2 SPRAYS INTO EACH NOSTRIL EVERY DAY 04/15/19   [name], MD  testosterone cypionate (DEPOTESTOSTERONE CYPIONATE) 200 MG/ML injection INJECT 1 ML EVERY 14 DAYS 01/25/19   [name], MD    Past Medical History: Past Medical History:  Diagnosis Date   Allergic rhinitis, cause unspecified    Allergy    Anxiety    Arthritis    Cancer ([place])    Cataract    Coronary atherosclerosis of unspecified type of vessel, native or graft    a. Details unclear, someone previously wrote "1985 , arthroplasty x 1."    Depression    PTSD    Diverticulosis of colon (without mention of  hemorrhage)    Esophageal reflux    Esophageal stricture    Essential hypertension    Flushing    Hemorrhoids    Hyperlipidemia    Irritable bowel syndrome    Myocardial infarction (H[facility])    Nocturia    Obesity, unspecified    Other acquired absence of organ    Other malaise and fatigue    Personal history of colonic polyps    Psychosexual dysfunction with inhibited sexual excitement    Type II or unspecified type diabetes mellitus without mention of complication, not stated as uncontrolled     Past Surgical History: Past Surgical  History:  Procedure Laterality Date   ADENOIDECTOMY     CARPAL TUNNEL RELEASE     left    CHOLECYSTECTOMY     COLON RESECTION     18 inches   COLON SURGERY  2013   CORONARY ANGIOPLASTY     1985    ELBOW BURSA SURGERY     EYE SURGERY     POLYPECTOMY     SHOULDER SURGERY     Rt. Shoulder   TONSILLECTOMY      Family History: Family History  Problem Relation Age of Onset   Lung cancer Father    Allergic rhinitis Father    Coronary artery disease Mother    Colon cancer Neg Hx    Heart attack Neg Hx    Hypertension Neg Hx    Stroke Neg Hx    Esophageal cancer Neg Hx    Rectal cancer Neg Hx    Stomach cancer Neg Hx    Pancreatic cancer Neg Hx     Social History: Social History   Socioeconomic History   Marital status: Married    Spouse name: Not on file   Number of children: 1   Years of education: Not on file   Highest education level: Not on file  Occupational History   Occupation: [employer]  Scientist, product/process development strain: Not on file   Food insecurity    Worry: Not on file    Inability: Not on file   Transportation needs    Medical: Not on file    Non-medical: Not on file  Tobacco Use   Smoking status: Former Smoker    Quit date: 08/30/1983    Years since quitting: 35.6   Smokeless tobacco: Former Systems developer   Tobacco comment: Quit 30 years ago.  Substance and Sexual  Activity   Alcohol use: Yes    Comment: rare   Drug use: No   Sexual activity: Yes  Lifestyle   Physical activity    Days per week: Not on file    Minutes per session: Not on file   Stress: Not on file  Relationships   Social connections    Talks on phone: Not on file    Gets together: Not on file    Attends religious service: Not on file    Active member of club or organization: Not on file    Attends meetings of clubs or organizations: Not on file    Relationship status: Not on file  Other Topics Concern   Not on file  Social History Narrative   Not on file    Allergies:  Allergies  Allergen Reactions   Doxazosin Mesylate     Reaction=unknown   Doxazosin Mesylate Other (See Comments)   Other Other (See Comments)   Flomax [Tamsulosin Hcl]     Itching    Jardiance [Empagliflozin] Other (See Comments)    Nausea, dizziness - Severe (per pt)   Penicillins Rash and Other (See Comments)    Has patient had a PCN reaction causing immediate rash, facial/tongue/throat swelling, SOB or lightheadedness with hypotension: No Has patient had a PCN reaction causing severe rash involving mucus membranes or skin necrosis: No Has patient had a PCN reaction that required hospitalization No Has patient had a PCN reaction occurring within the last 10 years: No If all of the above answers are "NO", then may proceed with Cephalosporin use.    Sulfonamide Derivatives Rash   Trulicity [Dulaglutide] Nausea And Vomiting  N&V, Dizziness    Objective:    Vital Signs:   Temp:  [97.4 F (36.3 C)-98 F (36.7 C)] 97.4 F (36.3 C) (08/18 1100) Pulse Rate:  [70-85] 71 (08/18 1245) Resp:  [12-28] 20 (08/18 1245) BP: (99-152)/(57-100) 108/94 (08/18 1245) SpO2:  [83 %-100 %] 98 % (08/18 1245) FiO2 (%):  [40 %] 40 % (08/18 0800) Weight:  [116.3 kg] 116.3 kg (08/18 0354) Last BM Date: 04/15/19  Weight change: Filed Weights   04/15/19 0250 04/15/19 1938 04/16/19 0354    Weight: 115.5 kg 116.3 kg 116.3 kg    Intake/Output:   Intake/Output Summary (Last 24 hours) at 04/16/2019 1530 Last data filed at 04/16/2019 0800 Gross per 24 hour  Intake 1003.25 ml  Output 425 ml  Net 578.25 ml      Physical Exam    General:   Obese male. No resp difficulty HEENT: normal Neck: supple. JVP 6-7 . Carotids 2+ bilat; no bruits. No lymphadenopathy or thyromegaly appreciated. Cor: PMI nondisplaced. Regular rate & rhythm. No rubs, gallops or murmurs. Lungs: coarse Abdomen: obesesoft, nontender, nondistended. No hepatosplenomegaly. No bruits or masses. Good bowel sounds. Extremities: no cyanosis, clubbing, rash, edema Neuro: alert & orientedx3, cranial nerves grossly intact. moves all 4 extremities w/o difficulty. Affect pleasant   Telemetry   SR 70-80s Personally reviewed   EKG     Sinus tach 115. IVCD frequent PVCs. Personally reviewed   Labs   Basic Metabolic Panel: Recent Labs  Lab 04/12/19 1647 04/12/19 2352 04/13/19 0142 04/14/19 8119 04/15/19 0409 04/16/19 0348 04/16/19 0954  NA 132*  --  131* 132* 132* 131* 131*  K 4.1  --  4.2 3.6 3.9 4.0 4.3  CL 95*  --  95* 94* 94* 91*  --   CO2 23  --  20* 26 26 28   --   GLUCOSE 121*  --  220* 165* 157* 232*  --   BUN 16  --  19 28* 24* 23  --   CREATININE 1.05 1.41* 1.51* 1.34* 0.94 1.01  --   CALCIUM 9.9  --  9.2 9.2 9.3 9.6  --     Liver Function Tests: Recent Labs  Lab 04/12/19 1647 04/13/19 0142 04/15/19 0409 04/16/19 0348  AST 41 64* 69* 81*  ALT 29 32 44 57*  ALKPHOS 56 57 68 90  BILITOT 1.5* 2.1* 0.8 0.7  PROT 6.9 6.8 5.6* 6.3*  ALBUMIN 3.7 3.3* 2.4* 2.7*   No results for input(s): LIPASE, AMYLASE in the last 168 hours. No results for input(s): AMMONIA in the last 168 hours.  CBC: Recent Labs  Lab 04/12/19 1647 04/12/19 2352 04/13/19 0142 04/14/19 0312 04/15/19 0409 04/16/19 0348 04/16/19 0954  WBC 14.8* 16.1* 16.8* 14.2* 11.9* 14.8*  --   NEUTROABS 12.1*  --   --    --   --   --   --   HGB 16.6 16.6 17.2* 14.9 14.0 12.9* 10.9*  HCT 52.2* 51.5 54.1* 44.2 41.3 39.9 32.0*  MCV 92.7 92.8 93.8 88.9 88.1 90.7  --   PLT 141* 132* 176 163 172 275  --     Cardiac Enzymes: Recent Labs  Lab 04/11/19 1846  CKTOTAL 357    BNP: BNP (last 3 results) Recent Labs    04/12/19 1647  BNP 359.3*    ProBNP (last 3 results) No results for input(s): PROBNP in the last 8760 hours.   CBG: Recent Labs  Lab 04/15/19 1236 04/15/19 1654 04/15/19 2138 04/16/19  2979 04/16/19 1131  GLUCAP 212* 155* 183* 210* 218*    Coagulation Studies: No results for input(s): LABPROT, INR in the last 72 hours.   Imaging    No results found.   Medications:     Current Medications:  aspirin  81 mg Oral Daily   carvedilol  6.25 mg Oral BID WC   Chlorhexidine Gluconate Cloth  6 each Topical Daily   citalopram  10 mg Oral Daily   finasteride  5 mg Oral Daily   insulin aspart  0-20 Units Subcutaneous TID WC   insulin aspart  0-5 Units Subcutaneous QHS   lidocaine  2 patch Transdermal Q24H   mouth rinse  15 mL Mouth Rinse BID   pantoprazole  40 mg Oral q morning - 10a   polyethylene glycol  17 g Oral Daily   rosuvastatin  40 mg Oral q1800   senna-docusate  1 tablet Oral BID   sodium chloride flush  3 mL Intravenous Q12H     Infusions:  sodium chloride     azithromycin Stopped (04/15/19 2240)   cefTRIAXone (ROCEPHIN)  IV Stopped (04/16/19 0030)   heparin          Assessment/Plan   1. Acute Hypoxic Respiratory Failure in the setting of PNA Blood cultures - NGTD.  Completing antibiotic course of azithromycin/ceftriaxone.  Intubated on admit and later extubated 8/15.  Now on 2 liters oxygen with stable O2 sats  - Aggressive pulmonary toilet.   2. NSTEMI HS Trop 136>150> 1963 Cath today with severe 3V disease,ostial proximal and mid LAD, the mid RCA, and the mid and distal circumflex.  CT surgery consulted for possible CABG. On  ASA + crestor.   3. Acute Systolic HF, ICM LHC today as noted above. RHC with CI 1.6 CO 3.7.  He does not appear volume overloaded on RHC/physical exam. . Hold off on lasix  Add digoxin 0.125 mg, milrinone 0.125 mcg, & 12.5 mg spiro.  Place PICC . Check CVP/CO-OX   4. Diabetes - HgBa1c 7.0   Length of Stay: 4  Amy Clegg, NP  04/16/2019, 3:30 PM  Advanced Heart Failure Team Pager [phone] (M-F; 7a - 4p)  Please contact [facility] for night-coverage after hours (4p -7a ) and weekends on [email or web address]  Patient seen and examined with the above-signed Advanced Practice Provider and/or Housestaff. I personally reviewed laboratory data, imaging studies and relevant notes. I independently examined the patient and formulated the important aspects of the plan. I have edited the note to reflect any of my changes or salient points. I have personally discussed the plan with the patient and/or family.  Cath films, echo. ECG, CT scan and previous notes all reviewed personally.   73 y/o obese male admitted with PNA. Found to have severe calcific CAD and ischemic CM with EF 25%., RHC today shows well compensated filling pressures with low cardiac output. At baseline functional class reasonable.   On exam  Obese male NAD Cor RRR  Lungs coarse Ab obese NT Ext warm no edema  I agree that CABG is his best option but based on RHC and recent PNA will need some optimization beforehand. Will place PICC. Start milrinone and follow co-ox. Will need ambulation and aggressive pulmonary toilet with IS and flutter valve. We will see if we can have him ready for Friday or if it may be best to defer until next week. I have d/w Dr. Cyndia Bent personally.   Glori Bickers, MD  9:27 PM

## 2019-04-16 NOTE — Plan of Care (Signed)
  Problem: Education: Goal: Knowledge of General Education information will improve Description: Including pain rating scale, medication(s)/side effects and non-pharmacologic comfort measures Outcome: Progressing   Problem: Clinical Measurements: Goal: Will remain free from infection Outcome: Progressing Goal: Respiratory complications will improve Outcome: Progressing   

## 2019-04-16 NOTE — H&P (View-Only) (Signed)
Progress Note  [name] Date of Encounter: 04/16/2019  Primary Cardiologist: [name], MD   Subjective   Of BiPAP, denies CP or SOB. His hip pain has improved.  Inpatient Medications    Scheduled Meds: . aspirin  81 mg Oral Daily  . carvedilol  6.25 mg Oral BID WC  . Chlorhexidine Gluconate Cloth  6 each Topical Daily  . citalopram  10 mg Oral Daily  . finasteride  5 mg Oral Daily  . insulin aspart  0-20 Units Subcutaneous TID WC  . insulin aspart  0-5 Units Subcutaneous QHS  . lidocaine  2 patch Transdermal Q24H  . mouth rinse  15 mL Mouth Rinse BID  . pantoprazole  40 mg Oral q morning - 10a  . polyethylene glycol  17 g Oral Daily  . rosuvastatin  40 mg Oral q1800  . senna-docusate  1 tablet Oral BID  . sodium chloride flush  3 mL Intravenous Q12H   Continuous Infusions: . sodium chloride    . sodium chloride 10 mL/hr at 04/16/19 0700  . azithromycin Stopped (04/15/19 2240)  . cefTRIAXone (ROCEPHIN)  IV Stopped (04/16/19 0030)  . heparin 2,000 Units/hr (04/16/19 0805)   PRN Meds: sodium chloride, [DISCONTINUED] acetaminophen **OR** acetaminophen, bisacodyl, ipratropium-albuterol, ondansetron (ZOFRAN) IV, sodium chloride flush, traMADol   Vital Signs    Vitals:   04/16/19 0400 04/16/19 0500 04/16/19 0600 04/16/19 0700  BP: 125/74  127/75 115/71  Pulse: 75 79 80 80  Resp: (!) 22 (!) 24 (!) 24 (!) 26  Temp: 98 F (36.7 C)     TempSrc: Axillary     SpO2: 99% 96% 94% 95%  Weight:      Height:        Intake/Output Summary (Last 24 hours) at 04/16/2019 0826 Last data filed at 04/16/2019 0700 Gross per 24 hour  Intake 1165.27 ml  Output 925 ml  Net 240.27 ml   Last 3 Weights 04/16/2019 04/15/2019 04/15/2019  Weight (lbs) 256 lb 6.3 oz 256 lb 6.3 oz 254 lb 10.1 oz  Weight (kg) 116.3 kg 116.3 kg 115.5 kg      Telemetry    Sinus rhuythm - Personally Reviewed  Physical Exam   GEN: No acute distress.  On Bipap Neck: supple  Cardiac: RRR Respiratory: Minimal basilar crackles GI: Soft, nontender, non-distended  MS: No edema Neuro:  Nonfocal  Psych: Normal affect   Labs    High Sensitivity Troponin:   Recent Labs  Lab 04/12/19 1647 04/12/19 1826 04/12/19 2352  TROPONINIHS 136* 150* 1,963*      Chemistry Recent Labs  Lab 04/13/19 0142 04/14/19 0833 04/15/19 0409 04/16/19 0348  NA 131* 132* 132* 131*  K 4.2 3.6 3.9 4.0  CL 95* 94* 94* 91*  CO2 20* 26 26 28   GLUCOSE 220* 165* 157* 232*  BUN 19 28* 24* 23  CREATININE 1.51* 1.34* 0.94 1.01  CALCIUM 9.2 9.2 9.3 9.6  PROT 6.8  --  5.6* 6.3*  ALBUMIN 3.3*  --  2.4* 2.7*  AST 64*  --  69* 81*  ALT 32  --  44 57*  ALKPHOS 57  --  68 90  BILITOT 2.1*  --  0.8 0.7  GFRNONAA 45* 53* >60 >60  GFRAA 53* >60 >60 >60  ANIONGAP 16* 12 12 12      Hematology Recent Labs  Lab 04/14/19 0312 04/15/19 0409 04/16/19 0348  WBC 14.2* 11.9* 14.8*  RBC 4.97 4.69 4.40  HGB  14.9 14.0 12.9*  HCT 44.2 41.3 39.9  MCV 88.9 88.1 90.7  MCH 30.0 29.9 29.3  MCHC 33.7 33.9 32.3  RDW 14.7 14.6 14.5  PLT 163 172 275    BNP Recent Labs  Lab 04/12/19 1647  BNP 359.3*     DDimer  Recent Labs  Lab 04/12/19 1647  DDIMER 0.70*     Radiology    Dg Pelvis 1-2 Views  Result Date: 04/15/2019 CLINICAL DATA:  Chronic RIGHT hip pain. EXAM: PELVIS - 1-2 VIEW COMPARISON:  Plain film of the pelvis dated 12/05/2017. FINDINGS: Osseous alignment is normal. No fracture line or displaced fracture fragment seen. No acute or suspicious osseous finding. No significant degenerative change at either hip joint. Atherosclerotic calcifications within the pelvis and upper thighs. Soft tissues about the pelvis and hips are otherwise unremarkable. IMPRESSION: 1. No acute findings. No significant degenerative change. 2. Vascular calcifications. Electronically Signed   By: Franki Cabot M.D.   On: 04/15/2019 12:52   Dg Chest Port 1 View  Result Date: 04/15/2019 CLINICAL DATA:   73 year old male with a history pneumonia EXAM: PORTABLE CHEST 1 VIEW COMPARISON:  April 13, 2019 FINDINGS: Cardiomediastinal silhouette unchanged in size and contour. Interval removal of the endotracheal tube and gastric tube. Significantly improved airspace opacities of bilateral lungs. No pleural effusion or pneumothorax. No displaced fracture identified. IMPRESSION: Significant improvement in bilateral airspace opacities, with interval removal of endotracheal tube and gastric tube. Electronically Signed   By: Corrie Mckusick D.O.   On: 04/15/2019 07:51   TTE; 04/13/2019    1. The left ventricle has severely reduced systolic function, with an ejection fraction of 25-30%. The cavity size was normal. There is mildly increased left ventricular wall thickness. Left ventricular diastolic Doppler parameters are indeterminate.  2. Severe hypokinesis of the left ventricular, entire inferior wall and anteroseptal wall.  3. Left atrial size was mildly dilated.  4. Small pericardial effusion.  5. The pericardial effusion is posterior to the left ventricle.  6. The mitral valve is abnormal. Mild thickening of the mitral valve leaflet. There is mild to moderate mitral annular calcification present.  7. The aortic valve is tricuspid. Mild sclerosis of the aortic valve. No stenosis of the aortic valve.  8. The aorta is normal unless otherwise noted.  9. The inferior vena cava was dilated in size with <50% respiratory variability. 10. The interatrial septum was not assessed.    Patient Profile     73 y.o. male with past medical history of coronary artery disease admitted with pneumonia.  Echocardiogram however demonstrated severely reduced LV function and troponin increased.  Cardiology asked to evaluate.  Assessment & Plan    1 non-ST elevation myocardial infarction-patient has ruled in.  However this occurred in the setting of right lower lobe pneumonia.  His LV function is noted to be severely reduced on  echocardiogram with LVEF 25-30% and regional wall motion abnormalities. Cardiac cath is scheduled for today.Risk and benefits were explained, he agrees.  Continue aspirin 81 mg daily, heparin and nitroglycerin, crestor.  Increase carvedilol to 6.25 mg twice daily.   2 cardiomyopathy-ejection fraction severely reduced on echocardiogram.  As outlined above patient will require cardiac catheterization prior to discharge.  Question if there is a component of stress-induced cardiomyopathy. Increase carvedilol to 6.25 mg twice daily.  Will add ARB or Entresto later once it is clear renal function stable and post cath.  3 right lower lobe pneumonia-continue antibiotics.  Wean BiPAP to nasal cannula.  4 acute systolic congestive heart failure-we will hold on further diuresis today as creatinine has increased.  We will add Spironolactone later as renal function improves.  5 acute renal insufficiency-creatinine increased.  We will hold on further diuresis. Crea improved 1.34 -> 0.94->1.01.  6. Right hip OA - started on tramadol for pain control  For questions or updates, please contact [place] Please consult [email or web address] for contact info under     Signed, [name], MD  04/16/2019, 8:26 AM

## 2019-04-16 NOTE — CV Procedure (Signed)
   Right and left heart catheterization via right antecubital and radial vein and artery respectively using real-time vascular ultrasound for access.  Severe three-vessel coronary calcification and mitral annular calcification.  Segmental 30% mid to distal left main.  Heavy LAD calcification with 80% ostial segmental 95% proximal and 80% mid.  The distal LAD after the final diagonal branch contains eccentric 75% stenosis.  Mid circumflex 90% stenosis distal 90% stenosis followed by a large distal obtuse marginal.  RCA 90% mid.  Diffuse mid to distal disease.  LVEDP 16 mmHg; mean capillary wedge pressure 16 mmHg.  Mixed venous O2 saturation 52%.  Cardiac output 3.76 with cardiac index 1.61  Echo EF less than 30%  Impression: Ischemic cardiomyopathy, with likely stunned myocardium related to increased demand in the setting of pneumonia and severe three-vessel coronary disease.  Current numbers suggest euvolemia although cardiac output is low.  RECOMMENDATIONS: Medical management of pneumonia.  Consider advanced heart failure therapy to assist with turning the patient.  Consider surgical revascularization when other medical issues are adequately treated.  Whether or not a viability study is needed can be debated.

## 2019-04-16 NOTE — Progress Notes (Signed)
PT Cancellation Note  Patient Details Name: Cameron Daniel MRN: [id number] DOB: 01-Jul-1946   Cancelled Treatment:    Reason Eval/Treat Not Completed: Patient at procedure or test/unavailable(Pt in Cath lab.  Will most likely be on bedrest after test. )Will check back as able.    Denice Paradise 04/16/2019, 9:23 AM  [name] [name],PT Acute Rehabilitation Services Pager:  [phone]  Office:  [phone]

## 2019-04-16 NOTE — Progress Notes (Signed)
Progress Note  [name] Date of Encounter: 04/16/2019  Primary Cardiologist: [name], MD   Subjective   Of BiPAP, denies CP or SOB. His hip pain has improved.  Inpatient Medications    Scheduled Meds: . aspirin  81 mg Oral Daily  . carvedilol  6.25 mg Oral BID WC  . Chlorhexidine Gluconate Cloth  6 each Topical Daily  . citalopram  10 mg Oral Daily  . finasteride  5 mg Oral Daily  . insulin aspart  0-20 Units Subcutaneous TID WC  . insulin aspart  0-5 Units Subcutaneous QHS  . lidocaine  2 patch Transdermal Q24H  . mouth rinse  15 mL Mouth Rinse BID  . pantoprazole  40 mg Oral q morning - 10a  . polyethylene glycol  17 g Oral Daily  . rosuvastatin  40 mg Oral q1800  . senna-docusate  1 tablet Oral BID  . sodium chloride flush  3 mL Intravenous Q12H   Continuous Infusions: . sodium chloride    . sodium chloride 10 mL/hr at 04/16/19 0700  . azithromycin Stopped (04/15/19 2240)  . cefTRIAXone (ROCEPHIN)  IV Stopped (04/16/19 0030)  . heparin 2,000 Units/hr (04/16/19 0805)   PRN Meds: sodium chloride, [DISCONTINUED] acetaminophen **OR** acetaminophen, bisacodyl, ipratropium-albuterol, ondansetron (ZOFRAN) IV, sodium chloride flush, traMADol   Vital Signs    Vitals:   04/16/19 0400 04/16/19 0500 04/16/19 0600 04/16/19 0700  BP: 125/74  127/75 115/71  Pulse: 75 79 80 80  Resp: (!) 22 (!) 24 (!) 24 (!) 26  Temp: 98 F (36.7 C)     TempSrc: Axillary     SpO2: 99% 96% 94% 95%  Weight:      Height:        Intake/Output Summary (Last 24 hours) at 04/16/2019 0826 Last data filed at 04/16/2019 0700 Gross per 24 hour  Intake 1165.27 ml  Output 925 ml  Net 240.27 ml   Last 3 Weights 04/16/2019 04/15/2019 04/15/2019  Weight (lbs) 256 lb 6.3 oz 256 lb 6.3 oz 254 lb 10.1 oz  Weight (kg) 116.3 kg 116.3 kg 115.5 kg      Telemetry    Sinus rhuythm - Personally Reviewed  Physical Exam   GEN: No acute distress.  On Bipap Neck: supple  Cardiac: RRR Respiratory: Minimal basilar crackles GI: Soft, nontender, non-distended  MS: No edema Neuro:  Nonfocal  Psych: Normal affect   Labs    High Sensitivity Troponin:   Recent Labs  Lab 04/12/19 1647 04/12/19 1826 04/12/19 2352  TROPONINIHS 136* 150* 1,963*      Chemistry Recent Labs  Lab 04/13/19 0142 04/14/19 0833 04/15/19 0409 04/16/19 0348  NA 131* 132* 132* 131*  K 4.2 3.6 3.9 4.0  CL 95* 94* 94* 91*  CO2 20* 26 26 28   GLUCOSE 220* 165* 157* 232*  BUN 19 28* 24* 23  CREATININE 1.51* 1.34* 0.94 1.01  CALCIUM 9.2 9.2 9.3 9.6  PROT 6.8  --  5.6* 6.3*  ALBUMIN 3.3*  --  2.4* 2.7*  AST 64*  --  69* 81*  ALT 32  --  44 57*  ALKPHOS 57  --  68 90  BILITOT 2.1*  --  0.8 0.7  GFRNONAA 45* 53* >60 >60  GFRAA 53* >60 >60 >60  ANIONGAP 16* 12 12 12      Hematology Recent Labs  Lab 04/14/19 0312 04/15/19 0409 04/16/19 0348  WBC 14.2* 11.9* 14.8*  RBC 4.97 4.69 4.40  HGB  14.9 14.0 12.9*  HCT 44.2 41.3 39.9  MCV 88.9 88.1 90.7  MCH 30.0 29.9 29.3  MCHC 33.7 33.9 32.3  RDW 14.7 14.6 14.5  PLT 163 172 275    BNP Recent Labs  Lab 04/12/19 1647  BNP 359.3*     DDimer  Recent Labs  Lab 04/12/19 1647  DDIMER 0.70*     Radiology    Dg Pelvis 1-2 Views  Result Date: 04/15/2019 CLINICAL DATA:  Chronic RIGHT hip pain. EXAM: PELVIS - 1-2 VIEW COMPARISON:  Plain film of the pelvis dated 12/05/2017. FINDINGS: Osseous alignment is normal. No fracture line or displaced fracture fragment seen. No acute or suspicious osseous finding. No significant degenerative change at either hip joint. Atherosclerotic calcifications within the pelvis and upper thighs. Soft tissues about the pelvis and hips are otherwise unremarkable. IMPRESSION: 1. No acute findings. No significant degenerative change. 2. Vascular calcifications. Electronically Signed   By: Franki Cabot M.D.   On: 04/15/2019 12:52   Dg Chest Port 1 View  Result Date: 04/15/2019 CLINICAL DATA:   73 year old male with a history pneumonia EXAM: PORTABLE CHEST 1 VIEW COMPARISON:  April 13, 2019 FINDINGS: Cardiomediastinal silhouette unchanged in size and contour. Interval removal of the endotracheal tube and gastric tube. Significantly improved airspace opacities of bilateral lungs. No pleural effusion or pneumothorax. No displaced fracture identified. IMPRESSION: Significant improvement in bilateral airspace opacities, with interval removal of endotracheal tube and gastric tube. Electronically Signed   By: Corrie Mckusick D.O.   On: 04/15/2019 07:51   TTE; 04/13/2019    1. The left ventricle has severely reduced systolic function, with an ejection fraction of 25-30%. The cavity size was normal. There is mildly increased left ventricular wall thickness. Left ventricular diastolic Doppler parameters are indeterminate.  2. Severe hypokinesis of the left ventricular, entire inferior wall and anteroseptal wall.  3. Left atrial size was mildly dilated.  4. Small pericardial effusion.  5. The pericardial effusion is posterior to the left ventricle.  6. The mitral valve is abnormal. Mild thickening of the mitral valve leaflet. There is mild to moderate mitral annular calcification present.  7. The aortic valve is tricuspid. Mild sclerosis of the aortic valve. No stenosis of the aortic valve.  8. The aorta is normal unless otherwise noted.  9. The inferior vena cava was dilated in size with <50% respiratory variability. 10. The interatrial septum was not assessed.    Patient Profile     73 y.o. male with past medical history of coronary artery disease admitted with pneumonia.  Echocardiogram however demonstrated severely reduced LV function and troponin increased.  Cardiology asked to evaluate.  Assessment & Plan    1 non-ST elevation myocardial infarction-patient has ruled in.  However this occurred in the setting of right lower lobe pneumonia.  His LV function is noted to be severely reduced on  echocardiogram with LVEF 25-30% and regional wall motion abnormalities. Cardiac cath is scheduled for today.Risk and benefits were explained, he agrees.  Continue aspirin 81 mg daily, heparin and nitroglycerin, crestor.  Increase carvedilol to 6.25 mg twice daily.   2 cardiomyopathy-ejection fraction severely reduced on echocardiogram.  As outlined above patient will require cardiac catheterization prior to discharge.  Question if there is a component of stress-induced cardiomyopathy. Increase carvedilol to 6.25 mg twice daily.  Will add ARB or Entresto later once it is clear renal function stable and post cath.  3 right lower lobe pneumonia-continue antibiotics.  Wean BiPAP to nasal cannula.  4 acute systolic congestive heart failure-we will hold on further diuresis today as creatinine has increased.  We will add Spironolactone later as renal function improves.  5 acute renal insufficiency-creatinine increased.  We will hold on further diuresis. Crea improved 1.34 -> 0.94->1.01.  6. Right hip OA - started on tramadol for pain control  For questions or updates, please contact [place] Please consult [email or web address] for contact info under     Signed, [name], MD  04/16/2019, 8:26 AM

## 2019-04-17 ENCOUNTER — Inpatient Hospital Stay (HOSPITAL_COMMUNITY): Payer: Medicare HMO

## 2019-04-17 ENCOUNTER — Encounter (HOSPITAL_COMMUNITY): Payer: Self-pay | Admitting: Interventional Cardiology

## 2019-04-17 DIAGNOSIS — Z01810 Encounter for preprocedural cardiovascular examination: Secondary | ICD-10-CM

## 2019-04-17 DIAGNOSIS — E1169 Type 2 diabetes mellitus with other specified complication: Secondary | ICD-10-CM

## 2019-04-17 DIAGNOSIS — E785 Hyperlipidemia, unspecified: Secondary | ICD-10-CM

## 2019-04-17 DIAGNOSIS — M25551 Pain in right hip: Secondary | ICD-10-CM

## 2019-04-17 DIAGNOSIS — R7989 Other specified abnormal findings of blood chemistry: Secondary | ICD-10-CM

## 2019-04-17 LAB — CBC
HCT: 26.9 % — ABNORMAL LOW (ref 39.0–52.0)
Hemoglobin: 9.1 g/dL — ABNORMAL LOW (ref 13.0–17.0)
MCH: 30 pg (ref 26.0–34.0)
MCHC: 33.8 g/dL (ref 30.0–36.0)
MCV: 88.8 fL (ref 80.0–100.0)
Platelets: 265 10*3/uL (ref 150–400)
RBC: 3.03 MIL/uL — ABNORMAL LOW (ref 4.22–5.81)
RDW: 14.5 % (ref 11.5–15.5)
WBC: 18.5 10*3/uL — ABNORMAL HIGH (ref 4.0–10.5)
nRBC: 0.5 % — ABNORMAL HIGH (ref 0.0–0.2)

## 2019-04-17 LAB — HEPARIN LEVEL (UNFRACTIONATED): Heparin Unfractionated: 0.54 IU/mL (ref 0.30–0.70)

## 2019-04-17 LAB — GLUCOSE, CAPILLARY
Glucose-Capillary: 212 mg/dL — ABNORMAL HIGH (ref 70–99)
Glucose-Capillary: 212 mg/dL — ABNORMAL HIGH (ref 70–99)
Glucose-Capillary: 206 mg/dL — ABNORMAL HIGH (ref 70–99)
Glucose-Capillary: 188 mg/dL — ABNORMAL HIGH (ref 70–99)

## 2019-04-17 LAB — COMPREHENSIVE METABOLIC PANEL
ALT: 55 U/L — ABNORMAL HIGH (ref 0–44)
AST: 72 U/L — ABNORMAL HIGH (ref 15–41)
Albumin: 2.5 g/dL — ABNORMAL LOW (ref 3.5–5.0)
Alkaline Phosphatase: 88 U/L (ref 38–126)
Anion gap: 11 (ref 5–15)
BUN: 26 mg/dL — ABNORMAL HIGH (ref 8–23)
CO2: 26 mmol/L (ref 22–32)
Calcium: 9.1 mg/dL (ref 8.9–10.3)
Chloride: 93 mmol/L — ABNORMAL LOW (ref 98–111)
Creatinine, Ser: 0.92 mg/dL (ref 0.61–1.24)
GFR calc Af Amer: >60 mL/min (ref >60)
GFR calc non Af Amer: >60 mL/min (ref >60)
Glucose, Bld: 228 mg/dL — ABNORMAL HIGH (ref 70–99)
Potassium: 4.2 mmol/L (ref 3.5–5.1)
Sodium: 130 mmol/L — ABNORMAL LOW (ref 135–145)
Total Bilirubin: 0.8 mg/dL (ref 0.3–1.2)
Total Protein: 5.1 g/dL — ABNORMAL LOW (ref 6.5–8.1)

## 2019-04-17 LAB — COOXEMETRY PANEL
Carboxyhemoglobin: 1.6 % — ABNORMAL HIGH (ref 0.5–1.5)
Methemoglobin: 1.1 % (ref 0.0–1.5)
O2 Saturation: 54.5 %
Total hemoglobin: 7.9 g/dL — ABNORMAL LOW (ref 12.0–16.0)

## 2019-04-17 MED ORDER — SODIUM CHLORIDE 0.9% FLUSH
10.0000 mL | INTRAVENOUS | Status: DC | PRN
  Administered 2019-04-25: 10 mL
  Filled 2019-04-17: qty 40

## 2019-04-17 MED ORDER — SODIUM CHLORIDE 0.9% FLUSH
10.0000 mL | Freq: Two times a day (BID) | INTRAVENOUS | Status: DC
  Administered 2019-04-17 – 2019-04-25 (×14): 10 mL

## 2019-04-17 NOTE — Progress Notes (Signed)
[id number] Asked by RN to give [facility] booklet. Gave booklet to pt and wife and discussed how to view pre op video. Will see pt again prior to surgery and discuss importance of mobility and IS. Graylon Good RN BSN 04/17/2019 1:56 PM

## 2019-04-17 NOTE — Progress Notes (Signed)
Peripherally Inserted Central Catheter/Midline Placement  The IV Nurse has discussed with the patient and/or persons authorized to consent for the patient, the purpose of this procedure and the potential benefits and risks involved with this procedure.  The benefits include less needle sticks, lab draws from the catheter, and the patient may be discharged home with the catheter. Risks include, but not limited to, infection, bleeding, blood clot (thrombus formation), and puncture of an artery; nerve damage and irregular heartbeat and possibility to perform a PICC exchange if needed/ordered by physician.  Alternatives to this procedure were also discussed.  Bard Power PICC patient education guide, fact sheet on infection prevention and patient information card has been provided to patient /or left at bedside.    PICC/Midline Placement Documentation  PICC Double Lumen 67/59/16 PICC Right Basilic 40 cm 0 cm (Active)  Indication for Insertion or Continuance of Line Vasoactive infusions 04/17/19 0852  Exposed Catheter (cm) 0 cm 04/17/19 3846  Site Assessment Dry;Clean;Intact 04/17/19 0852  Lumen #1 Status Flushed;Blood return noted 04/17/19 0852  Lumen #2 Status Flushed;Blood return noted 04/17/19 0852  Dressing Type Transparent 04/17/19 0852  Dressing Status Clean;Dry;Intact;Antimicrobial disc in place;Other (Comment) 04/17/19 0852  Dressing Intervention New dressing 04/17/19 0852  Dressing Change Due 04/24/19 04/17/19 0852       Christella Noa Albarece 04/17/2019, 8:53 AM

## 2019-04-17 NOTE — Evaluation (Signed)
Physical Therapy Evaluation Patient Details Name: Cameron Daniel MRN: [id number] DOB: 26-Nov-1945 Today's Date: 04/17/2019   History of Present Illness  73yo male with hx CAD, HTN, DM who was seen 8/14 in ED with concerns for dehydration after working outside in the heat. He was given IV fluids and d/c. He returned 8/14 with chills, fatigue, cough, dyspnea.  CT chest with RLL consolidation, mildly elevated troponin.  Had progressive respiratory failure with hypoxia, worsening tachypnea and ultimately required intubation in ER. Admitted 04/12/19 for treatment of NSTEMI and PNA. Intubation ended 04/13/19, Cardiac cath 8/18  Clinical Impression  PTA pt living with wife in multistory home with level entry and bed and bath on main level. Pt fully independent still working with son in [place]. Pt is currently limited in safe mobility by pain in R hip as well as decreased strength and balance. Pt is min A for bed mobility and transfers. Unable to ambulate with pt today as Vascular Tech arrived for vascular study. PT recommending HHPT level rehab at d/c, however pt scheduled for CABG Friday and needs may change. PT will follow acutely.     Follow Up Recommendations Home health PT;Supervision for mobility/OOB    Equipment Recommendations  Rolling walker with 5" wheels       Precautions / Restrictions Precautions Precautions: None Restrictions Weight Bearing Restrictions: No      Mobility  Bed Mobility Overal bed mobility: Needs Assistance Bed Mobility: Supine to Sit     Supine to sit: Min assist     General bed mobility comments: minA for pulling up against therapist   Transfers Overall transfer level: Needs assistance Equipment used: 1 person hand held assist Transfers: Sit to/from Stand;Stand Pivot Transfers Sit to Stand: Min assist Stand pivot transfers: Min assist       General transfer comment: min A for steadying with standing and for pivot to/from  [facility]  Ambulation/Gait             General Gait Details: did not attempt as vascular present for study     Balance Overall balance assessment: Needs assistance Sitting-balance support: Feet supported;No upper extremity supported Sitting balance-Leahy Scale: Fair     Standing balance support: Single extremity supported;Bilateral upper extremity supported;During functional activity Standing balance-Leahy Scale: Poor Standing balance comment: requires at least single UE support                             Pertinent Vitals/Pain Pain Assessment: 0-10 Pain Score: 10-Worst pain ever Pain Location: Rhip Pain Descriptors / Indicators: Aching;Burning;Sore;Throbbing Pain Intervention(s): Limited activity within patient's tolerance;Monitored during session;Premedicated before session;Repositioned    Home Living Family/patient expects to be discharged to:: Private residence Living Arrangements: Spouse/significant other Available Help at Discharge: Family;Available 24 hours/day Type of Home: House Home Access: Level entry     Home Layout: Multi-level;Able to live on main level with bedroom/bathroom(doesn't go upstairs) Home Equipment: Grab bars - tub/shower;Hand held shower head;Shower seat      Prior Function Level of Independence: Independent               Extremity/Trunk ssessment   Upper Extremity Assessment Upper Extremity Assessment: RUE deficits/detail;LUE deficits/detail RUE Sensation: history of peripheral neuropathy LUE Sensation: history of peripheral neuropathy    Lower Extremity Assessment Lower Extremity Assessment: RLE deficits/detail;LLE deficits/detail RLE Deficits / Details: R hip ROM limited by pain, strength grossly 4/5 RLE Sensation: history of peripheral neuropathy RLE Coordination: decreased  fine motor LLE Deficits / Details: ROM WFL. strength grossly 4/5 LLE Sensation: history of peripheral neuropathy LLE Coordination: decreased  fine motor       Communication   Communication: HOH  Cognition Arousal/Alertness: Awake/alert Behavior During Therapy: WFL for tasks assessed/performed Overall Cognitive Status: Within Functional Limits for tasks assessed                                        General Comments General comments (skin integrity, edema, etc.): VSS        Assessment/Plan    PT Assessment Patient needs continued PT services  PT Problem List Decreased strength;Decreased activity tolerance;Decreased balance;Decreased mobility;Cardiopulmonary status limiting activity;Pain;Impaired sensation       PT Treatment Interventions DME instruction;Gait training;Functional mobility training;Therapeutic activities;Balance training;Therapeutic exercise;Cognitive remediation;Patient/family education    PT Goals (Current goals can be found in the Care Plan section)  Acute Rehab PT Goals Patient Stated Goal: go fishing PT Goal Formulation: With patient Time For Goal Achievement: 05/01/19 Potential to Achieve Goals: Good    Frequency Min 3X/week    AM-PAC PT "6 Clicks" Mobility  Outcome Measure Help needed turning from your back to your side while in a flat bed without using bedrails?: None Help needed moving from lying on your back to sitting on the side of a flat bed without using bedrails?: A Little Help needed moving to and from a bed to a chair (including a wheelchair)?: A Little Help needed standing up from a chair using your arms (e.g., wheelchair or bedside chair)?: A Little Help needed to walk in hospital room?: A Lot Help needed climbing 3-5 steps with a railing? : A Lot 6 Click Score: 17    End of Session Equipment Utilized During Treatment: Gait belt Activity Tolerance: Patient tolerated treatment well;Patient limited by pain Patient left: in bed;with call bell/phone within reach;Other (comment)(vascular tech) Nurse Communication: Mobility status;Patient requests pain meds PT  Visit Diagnosis: Unsteadiness on feet (R26.81);Other abnormalities of gait and mobility (R26.89);Muscle weakness (generalized) (M62.81);Difficulty in walking, not elsewhere classified (R26.2);Pain Pain - Right/Left: Right Pain - part of body: Hip    Time: 4580-9983 PT Time Calculation (min) (ACUTE ONLY): 23 min   Charges:   PT Evaluation $PT Eval Moderate Complexity: 1 Mod PT Treatments $Therapeutic Activity: 8-22 mins        Loral Campi B. Migdalia Dk PT, DPT Acute Rehabilitation Services Pager [phone] Office [phone]   [place] 04/17/2019, 10:24 AM

## 2019-04-17 NOTE — Progress Notes (Signed)
Pre cabg has been completed.   Preliminary results in CV Proc.   Abram Sander 04/17/2019 11:04 AM

## 2019-04-17 NOTE — Progress Notes (Addendum)
Inpatient Diabetes Program Recommendations  AACE/ADA: New Consensus Statement on Inpatient Glycemic Control   Target Ranges:  Prepandial:   less than 140 mg/dL      Peak postprandial:   less than 180 mg/dL (1-2 hours)      Critically ill patients:  140 - 180 mg/dL   Results for Cameron Daniel, Cameron Daniel (MRN [id number]) as of 04/17/2019 10:04  Ref. Range 04/16/2019 06:37 04/16/2019 11:31 04/16/2019 15:59 04/16/2019 22:01 04/17/2019 06:35  Glucose-Capillary Latest Ref Range: 70 - 99 mg/dL 210 (H)  Novolog 7 units 218 (H)  Novolog 7 units 217 (H)  Novolog 7 units 180 (H) 212 (H)  Novolog 7 units   Review of Glycemic Control   Diabetes history: DM2 Outpatient Diabetes medications: Glipizide 5 mg QAM, Actos 30 mg daily Current orders for Inpatient glycemic control: Novolog 0-20 units TID with meals, Novolog 0-5 units QHS  Inpatient Diabetes Program Recommendations:   Insulin - Basal: Please consider ordering Lantus 5 units Q24H starting now.  Insulin - Meal Coverage: Please consider ordering Novolog 3 units TID with meals for meal coverage if patient eats at least 50% of meals.  Thanks, Barnie Alderman, RN, MSN, CDE Diabetes Coordinator Inpatient Diabetes Program [phone] (Team Pager from 8am to 5pm)

## 2019-04-17 NOTE — Progress Notes (Addendum)
[facility]  PROGRESS NOTE  HIRAM MCIVER AST:[id number] DOB: [birth year] DOA: 04/12/2019 PCP: [name], MD  Brief History    Cameron Daniel is a 73 y.o. year old male with medical history significant for CAD, HTN, DM, Colon polyps, IBS, HLD, GERD with esophageal stricture, Diverticulosis, Depression, Anxiety former smoker  who presented on 04/12/2019 to [facility] long hospital with increased shortness of breath, cough and chills and was found to have acute hypoxic respiratory failure secondary to right lower lobe pneumonia requiring intubation due to worsening hypoxia/respiratory distress on 8/14.  Extubated on 8/15 but due to significant respiratory distress needed BiPAP.  After extubation patient again decompensated requiring BiPAP due to worsening respiratory symptoms, prompting further evaluation by TTE which showed reduced ejection fraction with wall motion on right heart cath/left heart cath on 8/18 found to have severe three-vessel disease elevated PCWP, cardiac index of 1.6.  At that time advanced heart failure was consulted to optimize for CABG  A & P   Acute hypoxic respiratory failure, multifactorial secondary to community-acquired pneumonia and CHF with reduced EF, resolved.  Completed 5-day course of azithromycin and ceftriaxone.  Closely continue goal-directed medical therapy for CHF addressed below  NSTEMI Acute CHF ischemic cardiomyopathy with reduced EF exacerbation.  EF of 25% on TTE with wall motion abnormalities, high-sensitivity troponin trend 136--150--1963. Right/left heart cath on 8/18 shows low cardiac output as well as severe three-vessel disease, seems volemic on exam with no symptoms, started on milrinone on 8/18 for inotropic support given poor cardiac index, advanced heart failure following-continue digoxin, Coreg, spironolactone, follow co-oximetry, monitor ins and outs, daily BMP,   Severe three-vessel disease/CAD.  Needs further optimization of  cardiac index/output currently on milrinone as directed by advanced heart failure team.  Possible CABG on 8/21 if preop risk improved  Chronic hyponatremia.  Baseline seems to be between 130s-135.  Currently stable at 130, closely monitor.  AKI, resolved.  Suspect prerenal etiology related to previous infection, peak creatinine of 1.5 , creatinine now stable around 1, held home Benicar closely monitor while on milrinone.  Depression, stable continue Celexa  Type 2 diabetes, well controlled, A1c 7 (02/15/2019) closely monitor CBG, sliding scale as needed, holding home oral hypoglycemics  BPH, stable continue Proscar  GERD, stable.  Continue PPI  Hyperlipidemia, stable continue simvastatin  Chronic right hip pain.  Continue supportive care with Tylenol, avoid NSAIDs given CAD   DVT prophylaxis: IV heparin Code Status: Full code Family Communication: No family at bedside Disposition Plan: Continue milrinone drip to improve cardiac index, plan for tentative CABG on 8/21    [facility] Direct contact: see [email or web address] (further directions at bottom of note if needed) 7PM-7AM contact night coverage as at bottom of note 04/17/2019, 7:50 AM  LOS: 5 days   Consultants  . PCCM, cardiology, CT surgery, advanced heart failure  Procedures  TTE; 04/13/2019   1. The left ventricle has severely reduced systolic function, with an ejection fraction of 25-30%. The cavity size was normal. There is mildly increased left ventricular wall thickness. Left ventricular diastolic Doppler parameters are indeterminate. 2. Severe hypokinesis of the left ventricular, entire inferior wall and anteroseptal wall. 3. Left atrial size was mildly dilated. 4. Small pericardial effusion. 5. The pericardial effusion is posterior to the left ventricle. 6. The mitral valve is abnormal. Mild thickening of the mitral valve leaflet. There is mild to moderate mitral annular calcification present. 7. The aortic  valve is tricuspid. Mild sclerosis of the aortic  valve. No stenosis of the aortic valve. 8. The aorta is normal unless otherwise noted. 9. The inferior vena cava was dilated in size with <50% respiratory variability. 10. The interatrial septum was not assessed.  Antibiotics  . Ceftriaxone 8/14-8/18 . Azithromycin 8/14-8/18 . Zosyn 8/14  Interval History/Subjective  No current chest pain.  Feels breathing is fine.  Does have some chronic back pain.  Objective   Vitals:  Vitals:   04/17/19 0333 04/17/19 0719  BP: 122/64 114/69  Pulse: 85 98  Resp: (!) 24 (!) 25  Temp: 97.8 F (36.6 C)   SpO2: 99% 94%    Exam:  Awake Alert, Oriented X 3, No new F.N deficits, Normal affect .AT,PERRAL Supple Neck,No appreciable JVD, no peripheral edema Symmetrical Chest wall movement, Good air movement bilaterally, minimal crackles at bases RRR,No Gallops,Rubs or new Murmurs, +ve B.Sounds, Abd Soft, No tenderness,  , No rebound - guarding or rigidity. No Cyanosis, Clubbing or edema, No new Rash or bruise     I have personally reviewed the following:   Data Reviewed: Basic Metabolic Panel: Recent Labs  Lab 04/13/19 0142 04/14/19 0833 04/15/19 0409 04/16/19 0348 04/16/19 0954 04/16/19 0956 04/17/19 0159  NA 131* 132* 132* 131* 131* 130* 130*  K 4.2 3.6 3.9 4.0 4.3 4.4 4.2  CL 95* 94* 94* 91*  --   --  93*  CO2 20* 26 26 28   --   --  26  GLUCOSE 220* 165* 157* 232*  --   --  228*  BUN 19 28* 24* 23  --   --  26*  CREATININE 1.51* 1.34* 0.94 1.01  --   --  0.92  CALCIUM 9.2 9.2 9.3 9.6  --   --  9.1   Liver Function Tests: Recent Labs  Lab 04/12/19 1647 04/13/19 0142 04/15/19 0409 04/16/19 0348 04/17/19 0159  AST 41 64* 69* 81* 72*  ALT 29 32 44 57* 55*  ALKPHOS 56 57 68 90 88  BILITOT 1.5* 2.1* 0.8 0.7 0.8  PROT 6.9 6.8 5.6* 6.3* 5.1*  ALBUMIN 3.7 3.3* 2.4* 2.7* 2.5*   No results for input(s): LIPASE, AMYLASE in the last 168 hours. No results for input(s):  AMMONIA in the last 168 hours. CBC: Recent Labs  Lab 04/12/19 1647  04/13/19 0142 04/14/19 0312 04/15/19 0409 04/16/19 0348 04/16/19 0954 04/16/19 0956 04/17/19 0159  WBC 14.8*   < > 16.8* 14.2* 11.9* 14.8*  --   --  18.5*  NEUTROABS 12.1*  --   --   --   --   --   --   --   --   HGB 16.6   < > 17.2* 14.9 14.0 12.9* 10.9* 10.5* 9.1*  HCT 52.2*   < > 54.1* 44.2 41.3 39.9 32.0* 31.0* 26.9*  MCV 92.7   < > 93.8 88.9 88.1 90.7  --   --  88.8  PLT 141*   < > 176 163 172 275  --   --  265   < > = values in this interval not displayed.   Cardiac Enzymes: Recent Labs  Lab 04/11/19 1846  CKTOTAL 357   BNP (last 3 results) Recent Labs    04/12/19 1647  BNP 359.3*    ProBNP (last 3 results) No results for input(s): PROBNP in the last 8760 hours.  CBG: Recent Labs  Lab 04/16/19 0637 04/16/19 1131 04/16/19 1559 04/16/19 2201 04/17/19 0635  GLUCAP 210* 218* 217* 180* 212*  Recent Results (from the past 240 hour(s))  SARS Coronavirus 2 [facility] order, Performed in [facility] hospital lab) Nasopharyngeal Nasopharyngeal Swab     Status: None   Collection Time: 04/12/19  4:47 PM   Specimen: Nasopharyngeal Swab  Result Value Ref Range Status   SARS Coronavirus 2 NEGATIVE NEGATIVE Final    Comment: (NOTE) If result is NEGATIVE SARS-CoV-2 target nucleic acids are NOT DETECTED. The SARS-CoV-2 RNA is generally detectable in upper and lower  respiratory specimens during the acute phase of infection. The lowest  concentration of SARS-CoV-2 viral copies this assay can detect is 250  copies / mL. A negative result does not preclude SARS-CoV-2 infection  and should not be used as the sole basis for treatment or other  patient management decisions.  A negative result may occur with  improper specimen collection / handling, submission of specimen other  than nasopharyngeal swab, presence of viral mutation(s) within the  areas targeted by this assay, and inadequate number of  viral copies  (<250 copies / mL). A negative result must be combined with clinical  observations, patient history, and epidemiological information. If result is POSITIVE SARS-CoV-2 target nucleic acids are DETECTED. The SARS-CoV-2 RNA is generally detectable in upper and lower  respiratory specimens dur ing the acute phase of infection.  Positive  results are indicative of active infection with SARS-CoV-2.  Clinical  correlation with patient history and other diagnostic information is  necessary to determine patient infection status.  Positive results do  not rule out bacterial infection or co-infection with other viruses. If result is PRESUMPTIVE POSTIVE SARS-CoV-2 nucleic acids MAY BE PRESENT.   A presumptive positive result was obtained on the submitted specimen  and confirmed on repeat testing.  While 2019 novel coronavirus  (SARS-CoV-2) nucleic acids may be present in the submitted sample  additional confirmatory testing may be necessary for epidemiological  and / or clinical management purposes  to differentiate between  SARS-CoV-2 and other Sarbecovirus currently known to infect humans.  If clinically indicated additional testing with an alternate test  methodology [phone]) is advised. The SARS-CoV-2 RNA is generally  detectable in upper and lower respiratory sp ecimens during the acute  phase of infection. The expected result is Negative. Fact Sheet for Patients:  StrictlyIdeas.no Fact Sheet for Healthcare Providers: BankingDealers.co.za This test is not yet approved or cleared by the [place] FDA and has been authorized for detection and/or diagnosis of SARS-CoV-2 by FDA under an Emergency Use Authorization (EUA).  This EUA will remain in effect (meaning this test can be used) for the duration of the COVID-19 declaration under Section 564(b)(1) of the Act, 21 U.S.C. section 360bbb-3(b)(1), unless the authorization is  terminated or revoked sooner. Performed at [facility], [place] [address] [place], [place] [address]   Culture, blood (Routine X 2) w Reflex to ID Panel     Status: None (Preliminary result)   Collection Time: 04/12/19  9:14 PM   Specimen: BLOOD RIGHT FOREARM  Result Value Ref Range Status   Specimen Description   Final    BLOOD RIGHT FOREARM Performed at [facility], [place] [address] [place], [place] [address]    Special Requests   Final    BOTTLES DRAWN AEROBIC AND ANAEROBIC Blood Culture adequate volume Performed at [place] [address] [place],  [address]    Culture   Final    NO GROWTH 4 DAYS Performed at [facility], [address], [place], [place] [address]    Report Status PENDING  Incomplete  Culture, blood (Routine X 2) w Reflex to ID Panel     Status: None (Preliminary result)   Collection Time: 04/12/19 11:52 PM   Specimen: BLOOD  Result Value Ref Range Status   Specimen Description   Final    BLOOD RIGHT ANTECUBITAL Performed at [place] [address], [place], [place] [address]    Special Requests   Final    BOTTLES DRAWN AEROBIC AND ANAEROBIC Blood Culture adequate volume Performed at [place] [address], [place], [place] [address]    Culture   Final    NO GROWTH 4 DAYS Performed at [facility], [place] [address], [place], [place] [address]    Report Status PENDING  Incomplete  MRSA PCR Screening     Status: None   Collection Time: 04/13/19  6:21 PM   Specimen: Nasopharyngeal  Result Value Ref Range Status   MRSA by PCR NEGATIVE NEGATIVE Final    Comment:        The GeneXpert MRSA Assay (FDA approved for NASAL specimens only), is one component of a comprehensive MRSA colonization surveillance program. It is not intended to diagnose MRSA infection nor to guide or monitor treatment for MRSA infections. Performed at [facility], [place] [address], [place], [place] [address]      Studies: Dg Pelvis 1-2 Views  Result Date: 04/15/2019 CLINICAL DATA:  Chronic RIGHT hip pain. EXAM: PELVIS - 1-2 VIEW COMPARISON:  Plain film of the pelvis dated 12/05/2017. FINDINGS: Osseous alignment is normal. No fracture line or displaced fracture fragment seen. No acute or suspicious osseous finding. No significant degenerative change at either hip joint. Atherosclerotic calcifications within the pelvis and upper thighs. Soft tissues about the pelvis and hips are otherwise unremarkable. IMPRESSION: 1. No acute findings. No significant degenerative change. 2. Vascular calcifications. Electronically Signed   By: Franki Cabot M.D.   On: 04/15/2019 12:52   [place] Ekg Site Rite  Result Date: 04/16/2019 If Site Rite image not attached, placement could not be confirmed due to current cardiac rhythm.   Scheduled Meds: . aspirin  81 mg Oral Daily  . carvedilol  6.25 mg Oral BID WC  . Chlorhexidine Gluconate Cloth  6 each Topical Daily  . citalopram  10 mg Oral Daily  . digoxin  0.125 mg Oral Daily  . finasteride  5 mg Oral Daily  . insulin aspart  0-20 Units Subcutaneous TID WC  . insulin aspart  0-5 Units Subcutaneous QHS  . lidocaine  2 patch Transdermal Q24H  . mouth rinse  15 mL Mouth Rinse BID  . pantoprazole  40 mg Oral q morning - 10a  . polyethylene glycol  17 g Oral Daily  . rosuvastatin  40 mg Oral q1800  . senna-docusate  1 tablet Oral BID  . sodium chloride flush  3 mL Intravenous Q12H  . spironolactone  12.5 mg Oral Daily   Continuous Infusions: . sodium chloride    . heparin 2,000 Units/hr (04/16/19 1752)  . milrinone 0.125 mcg/kg/min (04/17/19 0600)    Principal Problem:   Non-ST elevation (NSTEMI) myocardial infarction [facility]) Active Problems:   Coronary atherosclerosis   Community acquired pneumonia of right lower lobe of lung ([place])   Acute respiratory failure (HCC)   AKI (acute kidney injury) ([place])   Acute  systolic HF (heart failure) ([place])  [facility]

## 2019-04-17 NOTE — Progress Notes (Signed)
[place] for heparin Indication: chest pain/ACS  Allergies  Allergen Reactions  . Doxazosin Mesylate     Reaction=unknown  . Doxazosin Mesylate Other (See Comments)  . Other Other (See Comments)  . Flomax [Tamsulosin Hcl]     Itching   . Jardiance [Empagliflozin] Other (See Comments)    Nausea, dizziness - Severe (per pt)  . Penicillins Rash and Other (See Comments)    Has patient had a PCN reaction causing immediate rash, facial/tongue/throat swelling, SOB or lightheadedness with hypotension: No Has patient had a PCN reaction causing severe rash involving mucus membranes or skin necrosis: No Has patient had a PCN reaction that required hospitalization No Has patient had a PCN reaction occurring within the last 10 years: No If all of the above answers are "NO", then may proceed with Cephalosporin use.   . Sulfonamide Derivatives Rash  . Trulicity [Dulaglutide] Nausea And Vomiting    N&V, Dizziness    Patient Measurements: Height: 5\' 11"  (180.3 cm) Weight: 260 lb 5.8 oz (118.1 kg) IBW/kg (Calculated) : 75.3 Heparin Dosing Weight: 102 kg  Vital Signs: Temp: 97.8 F (36.6 C) (08/19 0333) Temp Source: Oral (08/19 0333) BP: 114/69 (08/19 0719) Pulse Rate: 98 (08/19 0719)  Labs: Recent Labs    04/15/19 0409  04/15/19 2152 04/16/19 0348 04/16/19 0954 04/16/19 0956 04/17/19 0159  HGB 14.0  --   --  12.9* 10.9* 10.5* 9.1*  HCT 41.3  --   --  39.9 32.0* 31.0* 26.9*  PLT 172  --   --  275  --   --  265  HEPARINUNFRC 0.21*   < > 0.44 0.61  --   --  0.54  CREATININE 0.94  --   --  1.01  --   --  0.92   < > = values in this interval not displayed.    Estimated Creatinine Clearance: 94.9 mL/min (by C-G formula based on SCr of 0.92 mg/dL).  Assessment: 73 y.o. male with NSTEMI for heparin. Plans noted for cath on 8/18.   Now s/p RHC/LHC with severe 3-vessel CAD and ischemic cardiomyopathy (possibly worsened by CAD / intubation / PNA)  >> recommend CABG eval.   Heparin level is therapeutic at 0.54, on 2000 units/hr. Hgb trending down to 9.1, plt 265. No s/sx of bleeding.  Goal of Therapy:  Heparin level 0.3-0.7 units/ml Monitor platelets by anticoagulation protocol: Yes   Plan:  Continue heparin 2000 units/hr  Daily heparin level, CBC Monitor for bleeding F/u plans for surgical intervention  Antonietta Jewel, PharmD, [place] Clinical Pharmacist  Pager: [phone] Phone: [phone] 04/17/2019       9:21 AM  Please check [email or web address] for unit-specific pharmacist phone numbers

## 2019-04-17 NOTE — Progress Notes (Signed)
Advanced Heart Failure Rounding Note   Subjective:    PICC placed this am. Co-ox 54%. Started on milrinone.   C/o of severe back and hip pain which is chronic for him and made worse from lying in bed.   Denies CP. Dyspnea is much improved. No orthopnea or PND. Carotid u/s ok    Objective:   Weight Range:  Vital Signs:   Temp:  [97.5 F (36.4 C)-98 F (36.7 C)] 98 F (36.7 C) (08/19 1612) Pulse Rate:  [74-98] 85 (08/19 1612) Resp:  [24-25] 25 (08/19 0719) BP: (103-129)/(55-69) 129/69 (08/19 1612) SpO2:  [94 %-100 %] 98 % (08/19 1612) Weight:  [118.1 kg] 118.1 kg (08/19 0500) Last BM Date: 04/15/19  Weight change: Filed Weights   04/15/19 1938 04/16/19 0354 04/17/19 0500  Weight: 116.3 kg 116.3 kg 118.1 kg    Intake/Output:   Intake/Output Summary (Last 24 hours) at 04/17/2019 2242 Last data filed at 04/17/2019 2000 Gross per 24 hour  Intake 1828.31 ml  Output 850 ml  Net 978.31 ml     Physical Exam: General: Obese male sitting up in chair. No resp difficulty HEENT: normal Neck: supple. JVP not elevated . Carotids 2+ bilat; no bruits. No lymphadenopathy or thryomegaly appreciated. Cor: PMI nondisplaced. Regular rate & rhythm. No rubs, gallops or murmurs. Lungs: clear but decreased BS throughout Abdomen: obesesoft, nontender, nondistended. No hepatosplenomegaly. No bruits or masses. Good bowel sounds. Extremities: no cyanosis, clubbing, rash, edema Neuro: alert & orientedx3, cranial nerves grossly intact. moves all 4 extremities w/o difficulty. Affect pleasant  Telemetry: NSR 80-90s Personally reviewed   Labs: Basic Metabolic Panel: Recent Labs  Lab 04/13/19 0142 04/14/19 0833 04/15/19 0409 04/16/19 0348 04/16/19 0954 04/16/19 0956 04/17/19 0159  NA 131* 132* 132* 131* 131* 130* 130*  K 4.2 3.6 3.9 4.0 4.3 4.4 4.2  CL 95* 94* 94* 91*  --   --  93*  CO2 20* 26 26 28   --   --  26  GLUCOSE 220* 165* 157* 232*  --   --  228*  BUN 19 28* 24* 23  --    --  26*  CREATININE 1.51* 1.34* 0.94 1.01  --   --  0.92  CALCIUM 9.2 9.2 9.3 9.6  --   --  9.1    Liver Function Tests: Recent Labs  Lab 04/12/19 1647 04/13/19 0142 04/15/19 0409 04/16/19 0348 04/17/19 0159  AST 41 64* 69* 81* 72*  ALT 29 32 44 57* 55*  ALKPHOS 56 57 68 90 88  BILITOT 1.5* 2.1* 0.8 0.7 0.8  PROT 6.9 6.8 5.6* 6.3* 5.1*  ALBUMIN 3.7 3.3* 2.4* 2.7* 2.5*   No results for input(s): LIPASE, AMYLASE in the last 168 hours. No results for input(s): AMMONIA in the last 168 hours.  CBC: Recent Labs  Lab 04/12/19 1647  04/13/19 0142 04/14/19 0312 04/15/19 0409 04/16/19 0348 04/16/19 0954 04/16/19 0956 04/17/19 0159  WBC 14.8*   < > 16.8* 14.2* 11.9* 14.8*  --   --  18.5*  NEUTROABS 12.1*  --   --   --   --   --   --   --   --   HGB 16.6   < > 17.2* 14.9 14.0 12.9* 10.9* 10.5* 9.1*  HCT 52.2*   < > 54.1* 44.2 41.3 39.9 32.0* 31.0* 26.9*  MCV 92.7   < > 93.8 88.9 88.1 90.7  --   --  88.8  PLT 141*   < >  176 163 172 275  --   --  265   < > = values in this interval not displayed.    Cardiac Enzymes: Recent Labs  Lab 04/11/19 1846  CKTOTAL 357    BNP: BNP (last 3 results) Recent Labs    04/12/19 1647  BNP 359.3*    ProBNP (last 3 results) No results for input(s): PROBNP in the last 8760 hours.    Other results:  Imaging: Vas US Doppler Pre Cabg  Result Date: 04/17/2019 PREOPERATIVE VASCULAR EVALUATION  Indications:  Pre-surgical evaluation. Risk Factors: Hypertension, hyperlipidemia, Diabetes. Performing Technologist: Maudry Mayhew RDMS, RVT, RDCS Supporting Technologist: Abram Sander RVS  Examination Guidelines: A complete evaluation includes B-mode imaging, spectral Doppler, color Doppler, and power Doppler as needed of all accessible portions of each vessel. Bilateral testing is considered an integral part of a complete examination. Limited examinations for reoccurring indications may be performed as noted.  Right Carotid Findings:  +----------+--------+-------+--------+----------------------+------------------+             PSV cm/s EDV     Stenosis Describe               Comments                                 cm/s                                                        +----------+--------+-------+--------+----------------------+------------------+  CCA Prox   133      20                                      intimal thickening  +----------+--------+-------+--------+----------------------+------------------+  CCA Distal 102      20                                                          +----------+--------+-------+--------+----------------------+------------------+  ICA Prox   104      16               smooth and                                                                       heterogenous                               +----------+--------+-------+--------+----------------------+------------------+  ICA Distal 88       21                                                          +----------+--------+-------+--------+----------------------+------------------+  ECA        122      17                                                          +----------+--------+-------+--------+----------------------+------------------+ Portions of this table do not appear on this page. +----------+--------+-------+----------------+------------+             PSV cm/s EDV cms Describe         Arm Pressure  +----------+--------+-------+----------------+------------+  Subclavian 133              Multiphasic, WNL               +----------+--------+-------+----------------+------------+ +---------+--------+--------+--------------+  Vertebral PSV cm/s EDV cm/s Not identified  +---------+--------+--------+--------------+ Left Carotid Findings: +----------+-------+-------+--------+---------------------------------+--------+             PSV     EDV     Stenosis Describe                          Comments              cm/s    cm/s                                                          +----------+-------+-------+--------+---------------------------------+--------+  CCA Prox   132     31                                                           +----------+-------+-------+--------+---------------------------------+--------+  CCA Distal 85      22                                                           +----------+-------+-------+--------+---------------------------------+--------+  ICA Prox   74      16               heterogenous, irregular and                                                      calcific                                    +----------+-------+-------+--------+---------------------------------+--------+  ICA Distal 88      32               smooth, heterogenous and calcific           +----------+-------+-------+--------+---------------------------------+--------+  ECA        138     17  smooth and heterogenous                     +----------+-------+-------+--------+---------------------------------+--------+ +----------+--------+--------+----------------+------------+  Subclavian PSV cm/s EDV cm/s Describe         Arm Pressure  +----------+--------+--------+----------------+------------+             210               Multiphasic, WNL 120           +----------+--------+--------+----------------+------------+ +---------+--------+--+--------+--+---------+  Vertebral PSV cm/s 54 EDV cm/s 18 Antegrade  +---------+--------+--+--------+--+---------+  ABI Findings: +--------+------------------+-----+---------+--------------+  Right    Rt Pressure (mmHg) Index Waveform  Comment         +--------+------------------+-----+---------+--------------+  Brachial                          triphasic restricted arm  +--------+------------------+-----+---------+--------------+  PTA      255                2.12  triphasic                 +--------+------------------+-----+---------+--------------+  DP       109                0.91  triphasic                  +--------+------------------+-----+---------+--------------+ +--------+------------------+-----+---------+-------+  Left     Lt Pressure (mmHg) Index Waveform  Comment  +--------+------------------+-----+---------+-------+  Brachial 120                      triphasic          +--------+------------------+-----+---------+-------+  PTA      222                1.85  triphasic          +--------+------------------+-----+---------+-------+  DP       129                1.08  triphasic          +--------+------------------+-----+---------+-------+ +-------+---------------+----------------+  ABI/TBI Today's ABI/TBI Previous ABI/TBI  +-------+---------------+----------------+  Right   2.12                              +-------+---------------+----------------+  Left    1.85                              +-------+---------------+----------------+  Right Doppler Findings: +--------+--------+-----+---------+--------------+  Site     Pressure Index Doppler   Comments        +--------+--------+-----+---------+--------------+  Brachial                triphasic restricted arm  +--------+--------+-----+---------+--------------+  Radial                  triphasic                 +--------+--------+-----+---------+--------------+  Ulnar                   triphasic                 +--------+--------+-----+---------+--------------+  Left Doppler Findings: +--------+--------+-----+---------+--------+  Site     Pressure Index Doppler   Comments  +--------+--------+-----+---------+--------+  Brachial 120  triphasic           +--------+--------+-----+---------+--------+  Radial                  triphasic           +--------+--------+-----+---------+--------+  Ulnar                   triphasic           +--------+--------+-----+---------+--------+  Summary: Right Carotid: Velocities in the right ICA are consistent with a 1-39% stenosis. Left Carotid: Velocities in the left ICA are consistent with a 1-39% stenosis. Vertebrals:  Left  vertebral artery demonstrates antegrade flow. Right vertebral              artery was not visualized. Subclavians: Normal flow hemodynamics were seen in bilateral subclavian              arteries. Right ABI: Resting right ankle-brachial index indicates noncompressible right lower extremity arteries. Left ABI: Resting left ankle-brachial index indicates noncompressible left lower extremity arteries. Right Upper Extremity: Doppler waveform obliterate with right radial compression. Doppler waveform obliterate with right ulnar compression. Left Upper Extremity: Doppler waveforms decrease 50% with left radial compression. Doppler waveforms decrease 50% with left ulnar compression.  Electronically signed by Curt Jews MD on 04/17/2019 at 12:55:38 PM.    Final    [place] Ekg Site Rite  Result Date: 04/16/2019 If Site Rite image not attached, placement could not be confirmed due to current cardiac rhythm.     Medications:     Scheduled Medications:  aspirin  81 mg Oral Daily   carvedilol  6.25 mg Oral BID WC   Chlorhexidine Gluconate Cloth  6 each Topical Daily   citalopram  10 mg Oral Daily   digoxin  0.125 mg Oral Daily   finasteride  5 mg Oral Daily   insulin aspart  0-20 Units Subcutaneous TID WC   insulin aspart  0-5 Units Subcutaneous QHS   lidocaine  2 patch Transdermal Q24H   mouth rinse  15 mL Mouth Rinse BID   pantoprazole  40 mg Oral q morning - 10a   polyethylene glycol  17 g Oral Daily   rosuvastatin  40 mg Oral q1800   senna-docusate  1 tablet Oral BID   sodium chloride flush  10-40 mL Intracatheter Q12H   sodium chloride flush  3 mL Intravenous Q12H   spironolactone  12.5 mg Oral Daily     Infusions:  sodium chloride     heparin 2,000 Units/hr (04/17/19 1700)   milrinone 0.125 mcg/kg/min (04/17/19 1932)     PRN Medications:  sodium chloride, acetaminophen, bisacodyl, ipratropium-albuterol, ondansetron (ZOFRAN) IV, sodium chloride flush, sodium chloride  flush, traMADol   Assessment/Plam:   1. Acute Hypoxic Respiratory Failure in the setting of PNA - Intubated on admit and later extubated 8/15.  - much improved. - Completing antibiotic course of azithromycin/ceftriaxone.  - Now on 2 liters oxygen with stable O2 sats  - Needs aggressive pulmonary toilet - ambulation, flutter valve and IS   2. 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  - CT surgery consulted for possible CABG. On ASA + crestor.  - No angina currently  3. Acute Systolic HF, ICM - EF 20-25%. RV OK  RHC with CI 1.6 CO 3.7.  - PICC placed today - continue digoxin 0.125 mg, milrinone 0.125 mcg, &  sprio 12.5 mg  - follow CVPs. Diurese as needed - no b-blocker or ARB yet  4. Diabetes - HgBa1c 7.0  5. Arthritis pain - per priamry  He is improving. Would like to see him more ambulatory prior to surgery and also with improved co-ox. We will re-evaluate in am and discuss with Dr. Cyndia Bent if he will be ready for Friday or if better to wait until early next week. Currently I suspect Friday may be ok. We will see. PT seeing as well.    Length of Stay: 5   Glori Bickers MD 04/17/2019, 10:42 PM  Advanced Heart Failure Team Pager [phone] (M-F; [place])  Please contact [facility] for night-coverage after hours (4p -7a ) and weekends on [email or web address]

## 2019-04-18 ENCOUNTER — Encounter (HOSPITAL_COMMUNITY): Payer: Self-pay | Admitting: Anesthesiology

## 2019-04-18 ENCOUNTER — Inpatient Hospital Stay (HOSPITAL_COMMUNITY): Payer: Medicare HMO

## 2019-04-18 DIAGNOSIS — K661 Hemoperitoneum: Secondary | ICD-10-CM

## 2019-04-18 DIAGNOSIS — D62 Acute posthemorrhagic anemia: Secondary | ICD-10-CM

## 2019-04-18 DIAGNOSIS — D649 Anemia, unspecified: Secondary | ICD-10-CM

## 2019-04-18 LAB — COMPREHENSIVE METABOLIC PANEL
ALT: 48 U/L — ABNORMAL HIGH (ref 0–44)
AST: 65 U/L — ABNORMAL HIGH (ref 15–41)
Albumin: 2.5 g/dL — ABNORMAL LOW (ref 3.5–5.0)
Alkaline Phosphatase: 90 U/L (ref 38–126)
Anion gap: 11 (ref 5–15)
BUN: 19 mg/dL (ref 8–23)
CO2: 28 mmol/L (ref 22–32)
Calcium: 9.3 mg/dL (ref 8.9–10.3)
Chloride: 92 mmol/L — ABNORMAL LOW (ref 98–111)
Creatinine, Ser: 0.85 mg/dL (ref 0.61–1.24)
GFR calc Af Amer: >60 mL/min (ref >60)
GFR calc non Af Amer: >60 mL/min (ref >60)
Glucose, Bld: 165 mg/dL — ABNORMAL HIGH (ref 70–99)
Potassium: 4.1 mmol/L (ref 3.5–5.1)
Sodium: 131 mmol/L — ABNORMAL LOW (ref 135–145)
Total Bilirubin: 1.3 mg/dL — ABNORMAL HIGH (ref 0.3–1.2)
Total Protein: 5.1 g/dL — ABNORMAL LOW (ref 6.5–8.1)

## 2019-04-18 LAB — CBC
HCT: 22.4 % — ABNORMAL LOW (ref 39.0–52.0)
HCT: 22.7 % — ABNORMAL LOW (ref 39.0–52.0)
Hemoglobin: 7.3 g/dL — ABNORMAL LOW (ref 13.0–17.0)
Hemoglobin: 7.3 g/dL — ABNORMAL LOW (ref 13.0–17.0)
MCH: 29.9 pg (ref 26.0–34.0)
MCH: 29.3 pg (ref 26.0–34.0)
MCHC: 32.6 g/dL (ref 30.0–36.0)
MCHC: 32.2 g/dL (ref 30.0–36.0)
MCV: 91.8 fL (ref 80.0–100.0)
MCV: 91.2 fL (ref 80.0–100.0)
Platelets: 315 10*3/uL (ref 150–400)
Platelets: 343 10*3/uL (ref 150–400)
RBC: 2.44 MIL/uL — ABNORMAL LOW (ref 4.22–5.81)
RBC: 2.49 MIL/uL — ABNORMAL LOW (ref 4.22–5.81)
RDW: 14.5 % (ref 11.5–15.5)
RDW: 14.7 % (ref 11.5–15.5)
WBC: 20.3 10*3/uL — ABNORMAL HIGH (ref 4.0–10.5)
WBC: 21.2 10*3/uL — ABNORMAL HIGH (ref 4.0–10.5)
nRBC: 2.3 % — ABNORMAL HIGH (ref 0.0–0.2)
nRBC: 3.2 % — ABNORMAL HIGH (ref 0.0–0.2)

## 2019-04-18 LAB — GLUCOSE, CAPILLARY
Glucose-Capillary: 158 mg/dL — ABNORMAL HIGH (ref 70–99)
Glucose-Capillary: 209 mg/dL — ABNORMAL HIGH (ref 70–99)
Glucose-Capillary: 179 mg/dL — ABNORMAL HIGH (ref 70–99)
Glucose-Capillary: 212 mg/dL — ABNORMAL HIGH (ref 70–99)

## 2019-04-18 LAB — COOXEMETRY PANEL
Carboxyhemoglobin: 1.6 % — ABNORMAL HIGH (ref 0.5–1.5)
Carboxyhemoglobin: 2.1 % — ABNORMAL HIGH (ref 0.5–1.5)
Methemoglobin: 0.7 % (ref 0.0–1.5)
Methemoglobin: 1.2 % (ref 0.0–1.5)
O2 Saturation: 51 %
O2 Saturation: 46.3 %
Total hemoglobin: 7.4 g/dL — ABNORMAL LOW (ref 12.0–16.0)
Total hemoglobin: 8.5 g/dL — ABNORMAL LOW (ref 12.0–16.0)

## 2019-04-18 LAB — CULTURE, BLOOD (ROUTINE X 2)
Culture: NO GROWTH
Culture: NO GROWTH
Special Requests: ADEQUATE
Special Requests: ADEQUATE

## 2019-04-18 LAB — HEPARIN LEVEL (UNFRACTIONATED)
Heparin Unfractionated: 0.92 IU/mL — ABNORMAL HIGH (ref 0.30–0.70)
Heparin Unfractionated: 0.66 IU/mL (ref 0.30–0.70)

## 2019-04-18 LAB — PREPARE RBC (CROSSMATCH)

## 2019-04-18 LAB — ABO/RH: ABO/RH(D): B POS

## 2019-04-18 MED ORDER — INSULIN REGULAR(HUMAN) IN NACL 100-0.9 UT/100ML-% IV SOLN
INTRAVENOUS | Status: DC
  Filled 2019-04-18: qty 100

## 2019-04-18 MED ORDER — SODIUM CHLORIDE 0.9% IV SOLUTION: Freq: Once | INTRAVENOUS | Status: DC

## 2019-04-18 MED ORDER — MILRINONE LACTATE IN DEXTROSE 20-5 MG/100ML-% IV SOLN
0.3000 ug/kg/min | INTRAVENOUS | Status: AC
  Filled 2019-04-18: qty 100

## 2019-04-18 MED ORDER — TRANEXAMIC ACID (OHS) BOLUS VIA INFUSION
15.0000 mg/kg | INTRAVENOUS | Status: DC
  Filled 2019-04-18: qty 1796

## 2019-04-18 MED ORDER — DOPAMINE-DEXTROSE 3.2-5 MG/ML-% IV SOLN
0.0000 ug/kg/min | INTRAVENOUS | Status: DC
  Filled 2019-04-18: qty 250

## 2019-04-18 MED ORDER — DEXMEDETOMIDINE HCL IN NACL 400 MCG/100ML IV SOLN
0.1000 ug/kg/h | INTRAVENOUS | Status: DC
  Filled 2019-04-18: qty 100

## 2019-04-18 MED ORDER — TRANEXAMIC ACID 1000 MG/10ML IV SOLN
1.5000 mg/kg/h | INTRAVENOUS | Status: DC
  Filled 2019-04-18: qty 25

## 2019-04-18 MED ORDER — PLASMA-LYTE 148 IV SOLN
INTRAVENOUS | Status: DC
  Filled 2019-04-18: qty 2.5

## 2019-04-18 MED ORDER — MAGNESIUM SULFATE 50 % IJ SOLN
40.0000 meq | INTRAMUSCULAR | Status: DC
  Filled 2019-04-18: qty 9.85

## 2019-04-18 MED ORDER — EPINEPHRINE HCL 5 MG/250ML IV SOLN IN NS
0.0000 ug/min | INTRAVENOUS | Status: DC
  Filled 2019-04-18: qty 250

## 2019-04-18 MED ORDER — NITROGLYCERIN IN D5W 200-5 MCG/ML-% IV SOLN
2.0000 ug/min | INTRAVENOUS | Status: DC
  Filled 2019-04-18: qty 250

## 2019-04-18 MED ORDER — PHENYLEPHRINE HCL-NACL 20-0.9 MG/250ML-% IV SOLN
30.0000 ug/min | INTRAVENOUS | Status: DC
  Filled 2019-04-18: qty 250

## 2019-04-18 MED ORDER — POTASSIUM CHLORIDE 2 MEQ/ML IV SOLN
80.0000 meq | INTRAVENOUS | Status: AC
  Filled 2019-04-18: qty 40

## 2019-04-18 MED ORDER — NOREPINEPHRINE 4 MG/250ML-% IV SOLN
0.0000 ug/min | INTRAVENOUS | Status: DC
  Filled 2019-04-18: qty 250

## 2019-04-18 MED ORDER — LEVOFLOXACIN IN D5W 500 MG/100ML IV SOLN
500.0000 mg | INTRAVENOUS | Status: DC
  Filled 2019-04-18: qty 100

## 2019-04-18 MED ORDER — TRANEXAMIC ACID (OHS) PUMP PRIME SOLUTION
2.0000 mg/kg | INTRAVENOUS | Status: DC
  Filled 2019-04-18: qty 2.39

## 2019-04-18 MED ORDER — SODIUM CHLORIDE 0.9 % IV SOLN
INTRAVENOUS | Status: DC
  Filled 2019-04-18: qty 30

## 2019-04-18 MED ORDER — VANCOMYCIN HCL 10 G IV SOLR
1500.0000 mg | INTRAVENOUS | Status: DC
  Filled 2019-04-18: qty 1500

## 2019-04-18 NOTE — Progress Notes (Signed)
  Patient with severe hip and back pain and rapidly dropping hgb.   Heparin stopped. Stat CT showed large RP bleed.   Patient returned to [facility] and developed delirium. BP soft but stable.   Transfused RBCs.   Manual pressure being held on groin site. Will place fem stop.   Low threshold to move to ICU if needed.   D/w Dr. Cyndia Bent.   CCT 35 mins.   Glori Bickers, MD  3:59 PM

## 2019-04-18 NOTE — Progress Notes (Signed)
CARDIAC REHAB PHASE I   Came to walk with pt. Pt stating he is having too much pain to walk. Pt currently drinking contrast for a CT scan. Began talking with pt about OHS, pt unsure if he is still going tomorrow. Will f/u as time allows this afternoon.  [phone] [name], RN BSN 04/18/2019 10:41 AM

## 2019-04-18 NOTE — Progress Notes (Signed)
Pt with increasing confusin and hallucinations about a "man in a box" primary MD paged, and HF NP made aware, orders to re-check Coox which was sent by this RN. VSS. Will monitor.

## 2019-04-18 NOTE — Progress Notes (Signed)
[place] for heparin Indication: chest pain/ACS  Allergies  Allergen Reactions  . Doxazosin Mesylate     Reaction=unknown  . Doxazosin Mesylate Other (See Comments)  . Other Other (See Comments)  . Flomax [Tamsulosin Hcl]     Itching   . Jardiance [Empagliflozin] Other (See Comments)    Nausea, dizziness - Severe (per pt)  . Penicillins Rash and Other (See Comments)    Has patient had a PCN reaction causing immediate rash, facial/tongue/throat swelling, SOB or lightheadedness with hypotension: No Has patient had a PCN reaction causing severe rash involving mucus membranes or skin necrosis: No Has patient had a PCN reaction that required hospitalization No Has patient had a PCN reaction occurring within the last 10 years: No If all of the above answers are "NO", then may proceed with Cephalosporin use.   . Sulfonamide Derivatives Rash  . Trulicity [Dulaglutide] Nausea And Vomiting    N&V, Dizziness    Patient Measurements: Height: 5\' 11"  (180.3 cm) Weight: 260 lb 5.8 oz (118.1 kg) IBW/kg (Calculated) : 75.3 Heparin Dosing Weight: 102 kg  Vital Signs: Temp: 98.1 F (36.7 C) (08/19 2345) Temp Source: Oral (08/19 2345) BP: 110/68 (08/19 2345)  Labs: Recent Labs    04/16/19 0348  04/16/19 0956 04/17/19 0159 04/18/19 0153  HGB 12.9*   < > 10.5* 9.1* 7.3*  HCT 39.9   < > 31.0* 26.9* 22.4*  PLT 275  --   --  265 315  HEPARINUNFRC 0.61  --   --  0.54 0.92*  CREATININE 1.01  --   --  0.92 0.85   < > = values in this interval not displayed.    Estimated Creatinine Clearance: 102.7 mL/min (by C-G formula based on SCr of 0.85 mg/dL).  Assessment: 73 y.o. male with NSTEMI for heparin. Plans noted for cath on 8/18.   Now s/p RHC/LHC with severe 3-vessel CAD and ischemic cardiomyopathy (possibly worsened by CAD / intubation / PNA) >> recommend CABG eval.   Heparin level 0.92 units/ml, drawn from opposite arm per RN Goal of Therapy:   Heparin level 0.3-0.7 units/ml Monitor platelets by anticoagulation protocol: Yes   Plan:  Decrease heparin to 1800 units/hr  Check heparin level in ~ 6 hours after rate change Daily heparin level, CBC Monitor for bleeding F/u plans for surgical intervention  Thanks for allowing pharmacy to be a part of this patient's care.  Excell Seltzer, PharmD Clinical Pharmacist

## 2019-04-18 NOTE — Progress Notes (Signed)
Pt with increasing confusion, Able to answer orientation questions appropriately but then asking when the car will be done. Pt also proceeded to un-screw his IV tubing and hand it to the NT stating "here take this" Physicians notified.

## 2019-04-18 NOTE — Progress Notes (Signed)
CARDIAC REHAB PHASE I   CT results noted. Will f/u as appropriate.   [name], RN BSN 04/18/2019 2:02 PM

## 2019-04-18 NOTE — Progress Notes (Addendum)
Advanced Heart Failure Rounding Note   Subjective:    On milrinone 0.125 CO-OX 51%. Hgb 7.3  CVP 10-11  Complaining of abdominal/r groin pain.    Objective:   Weight Range:  Vital Signs:   Temp:  [97.5 F (36.4 C)-98.1 F (36.7 C)] 98.1 F (36.7 C) (08/19 2345) Pulse Rate:  [74-85] 85 (08/19 1612) Resp:  [21] 21 (08/19 2345) BP: (93-129)/(46-69) 93/46 (08/20 0300) SpO2:  [97 %-98 %] 98 % (08/19 1612) Weight:  [119.7 kg] 119.7 kg (08/20 0631) Last BM Date: 04/15/19  Weight change: Filed Weights   04/16/19 0354 04/17/19 0500 04/18/19 0631  Weight: 116.3 kg 118.1 kg 119.7 kg    Intake/Output:   Intake/Output Summary (Last 24 hours) at 04/18/2019 0826 Last data filed at 04/18/2019 0533 Gross per 24 hour  Intake 1637.37 ml  Output 850 ml  Net 787.37 ml     Physical Exam: CVP 8 -9  General:  Sitting in the chair. No resp difficulty HEENT: normal Neck: supple. JVP 8-9 . Carotids 2+ bilat; no bruits. No lymphadenopathy or thryomegaly appreciated. Cor: PMI nondisplaced. Regular rate & rhythm. No rubs, gallops or murmurs. Lungs: clear Abdomen: soft,  Lower abdomen tender, distended. No hepatosplenomegaly. No bruits or masses. Good bowel sounds. Extremities: no cyanosis, clubbing, rash, edema Neuro: alert & orientedx3, cranial nerves grossly intact. moves all 4 extremities w/o difficulty. Affect pleasant  Telemetry: NSR 80-90s    Labs: Basic Metabolic Panel: Recent Labs  Lab 04/14/19 0833 04/15/19 0409 04/16/19 0348 04/16/19 0954 04/16/19 0956 04/17/19 0159 04/18/19 0153  NA 132* 132* 131* 131* 130* 130* 131*  K 3.6 3.9 4.0 4.3 4.4 4.2 4.1  CL 94* 94* 91*  --   --  93* 92*  CO2 26 26 28   --   --  26 28  GLUCOSE 165* 157* 232*  --   --  228* 165*  BUN 28* 24* 23  --   --  26* 19  CREATININE 1.34* 0.94 1.01  --   --  0.92 0.85  CALCIUM 9.2 9.3 9.6  --   --  9.1 9.3    Liver Function Tests: Recent Labs  Lab 04/13/19 0142 04/15/19 0409  04/16/19 0348 04/17/19 0159 04/18/19 0153  AST 64* 69* 81* 72* 65*  ALT 32 44 57* 55* 48*  ALKPHOS 57 68 90 88 90  BILITOT 2.1* 0.8 0.7 0.8 1.3*  PROT 6.8 5.6* 6.3* 5.1* 5.1*  ALBUMIN 3.3* 2.4* 2.7* 2.5* 2.5*   No results for input(s): LIPASE, AMYLASE in the last 168 hours. No results for input(s): AMMONIA in the last 168 hours.  CBC: Recent Labs  Lab 04/12/19 1647  04/14/19 0312 04/15/19 0409 04/16/19 0348 04/16/19 0954 04/16/19 0956 04/17/19 0159 04/18/19 0153  WBC 14.8*   < > 14.2* 11.9* 14.8*  --   --  18.5* 20.3*  NEUTROABS 12.1*  --   --   --   --   --   --   --   --   HGB 16.6   < > 14.9 14.0 12.9* 10.9* 10.5* 9.1* 7.3*  HCT 52.2*   < > 44.2 41.3 39.9 32.0* 31.0* 26.9* 22.4*  MCV 92.7   < > 88.9 88.1 90.7  --   --  88.8 91.8  PLT 141*   < > 163 172 275  --   --  265 315   < > = values in this interval not displayed.    Cardiac Enzymes:  Recent Labs  Lab 04/11/19 1846  CKTOTAL 357    BNP: BNP (last 3 results) Recent Labs    04/12/19 1647  BNP 359.3*    ProBNP (last 3 results) No results for input(s): PROBNP in the last 8760 hours.    Other results:  Imaging: Vas US Doppler Pre Cabg  Result Date: 04/17/2019 PREOPERATIVE VASCULAR EVALUATION  Indications:  Pre-surgical evaluation. Risk Factors: Hypertension, hyperlipidemia, Diabetes. Performing Technologist: Maudry Mayhew RDMS, RVT, RDCS Supporting Technologist: Abram Sander RVS  Examination Guidelines: A complete evaluation includes B-mode imaging, spectral Doppler, color Doppler, and power Doppler as needed of all accessible portions of each vessel. Bilateral testing is considered an integral part of a complete examination. Limited examinations for reoccurring indications may be performed as noted.  Right Carotid Findings: +----------+--------+-------+--------+----------------------+------------------+             PSV cm/s EDV     Stenosis Describe               Comments                                  cm/s                                                        +----------+--------+-------+--------+----------------------+------------------+  CCA Prox   133      20                                      intimal thickening  +----------+--------+-------+--------+----------------------+------------------+  CCA Distal 102      20                                                          +----------+--------+-------+--------+----------------------+------------------+  ICA Prox   104      16               smooth and                                                                       heterogenous                               +----------+--------+-------+--------+----------------------+------------------+  ICA Distal 88       21                                                          +----------+--------+-------+--------+----------------------+------------------+  ECA        122      17                                                          +----------+--------+-------+--------+----------------------+------------------+  Portions of this table do not appear on this page. +----------+--------+-------+----------------+------------+             PSV cm/s EDV cms Describe         Arm Pressure  +----------+--------+-------+----------------+------------+  Subclavian 133              Multiphasic, WNL               +----------+--------+-------+----------------+------------+ +---------+--------+--------+--------------+  Vertebral PSV cm/s EDV cm/s Not identified  +---------+--------+--------+--------------+ Left Carotid Findings: +----------+-------+-------+--------+---------------------------------+--------+             PSV     EDV     Stenosis Describe                          Comments              cm/s    cm/s                                                         +----------+-------+-------+--------+---------------------------------+--------+  CCA Prox   132     31                                                            +----------+-------+-------+--------+---------------------------------+--------+  CCA Distal 85      22                                                           +----------+-------+-------+--------+---------------------------------+--------+  ICA Prox   74      16               heterogenous, irregular and                                                      calcific                                    +----------+-------+-------+--------+---------------------------------+--------+  ICA Distal 88      32               smooth, heterogenous and calcific           +----------+-------+-------+--------+---------------------------------+--------+  ECA        138     17               smooth and heterogenous                     +----------+-------+-------+--------+---------------------------------+--------+ +----------+--------+--------+----------------+------------+  Subclavian PSV cm/s EDV cm/s Describe         Arm Pressure  +----------+--------+--------+----------------+------------+             210  Multiphasic, WNL 120           +----------+--------+--------+----------------+------------+ +---------+--------+--+--------+--+---------+  Vertebral PSV cm/s 54 EDV cm/s 18 Antegrade  +---------+--------+--+--------+--+---------+  ABI Findings: +--------+------------------+-----+---------+--------------+  Right    Rt Pressure (mmHg) Index Waveform  Comment         +--------+------------------+-----+---------+--------------+  Brachial                          triphasic restricted arm  +--------+------------------+-----+---------+--------------+  PTA      255                2.12  triphasic                 +--------+------------------+-----+---------+--------------+  DP       109                0.91  triphasic                 +--------+------------------+-----+---------+--------------+ +--------+------------------+-----+---------+-------+  Left     Lt Pressure (mmHg) Index Waveform  Comment   +--------+------------------+-----+---------+-------+  Brachial 120                      triphasic          +--------+------------------+-----+---------+-------+  PTA      222                1.85  triphasic          +--------+------------------+-----+---------+-------+  DP       129                1.08  triphasic          +--------+------------------+-----+---------+-------+ +-------+---------------+----------------+  ABI/TBI Today's ABI/TBI Previous ABI/TBI  +-------+---------------+----------------+  Right   2.12                              +-------+---------------+----------------+  Left    1.85                              +-------+---------------+----------------+  Right Doppler Findings: +--------+--------+-----+---------+--------------+  Site     Pressure Index Doppler   Comments        +--------+--------+-----+---------+--------------+  Brachial                triphasic restricted arm  +--------+--------+-----+---------+--------------+  Radial                  triphasic                 +--------+--------+-----+---------+--------------+  Ulnar                   triphasic                 +--------+--------+-----+---------+--------------+  Left Doppler Findings: +--------+--------+-----+---------+--------+  Site     Pressure Index Doppler   Comments  +--------+--------+-----+---------+--------+  Brachial 120            triphasic           +--------+--------+-----+---------+--------+  Radial                  triphasic           +--------+--------+-----+---------+--------+  Ulnar                   triphasic           +--------+--------+-----+---------+--------+  Summary: Right Carotid: Velocities in the right ICA are consistent with a 1-39% stenosis. Left Carotid: Velocities in the left ICA are consistent with a 1-39% stenosis. Vertebrals:  Left vertebral artery demonstrates antegrade flow. Right vertebral              artery was not visualized. Subclavians: Normal flow hemodynamics were seen in bilateral  subclavian              arteries. Right ABI: Resting right ankle-brachial index indicates noncompressible right lower extremity arteries. Left ABI: Resting left ankle-brachial index indicates noncompressible left lower extremity arteries. Right Upper Extremity: Doppler waveform obliterate with right radial compression. Doppler waveform obliterate with right ulnar compression. Left Upper Extremity: Doppler waveforms decrease 50% with left radial compression. Doppler waveforms decrease 50% with left ulnar compression.  Electronically signed by Curt Jews MD on 04/17/2019 at 12:55:38 PM.    Final    [place] Ekg Site Rite  Result Date: 04/16/2019 If Site Rite image not attached, placement could not be confirmed due to current cardiac rhythm.    Medications:     Scheduled Medications:  aspirin  81 mg Oral Daily   carvedilol  6.25 mg Oral BID WC   Chlorhexidine Gluconate Cloth  6 each Topical Daily   citalopram  10 mg Oral Daily   digoxin  0.125 mg Oral Daily   finasteride  5 mg Oral Daily   insulin aspart  0-20 Units Subcutaneous TID WC   insulin aspart  0-5 Units Subcutaneous QHS   lidocaine  2 patch Transdermal Q24H   mouth rinse  15 mL Mouth Rinse BID   pantoprazole  40 mg Oral q morning - 10a   polyethylene glycol  17 g Oral Daily   rosuvastatin  40 mg Oral q1800   senna-docusate  1 tablet Oral BID   sodium chloride flush  10-40 mL Intracatheter Q12H   sodium chloride flush  3 mL Intravenous Q12H   spironolactone  12.5 mg Oral Daily    Infusions:  sodium chloride     heparin 1,800 Units/hr (04/18/19 0757)   milrinone 0.125 mcg/kg/min (04/18/19 0652)    PRN Medications: sodium chloride, acetaminophen, bisacodyl, ipratropium-albuterol, ondansetron (ZOFRAN) IV, sodium chloride flush, sodium chloride flush, traMADol   Assessment/Plam:   1. Acute Hypoxic Respiratory Failure in the setting of PNA - Intubated on admit and later extubated 8/15.  - much improved. -  Completing antibiotic course of azithromycin/ceftriaxone.  - Now on 2 liters oxygen with stable O2 sats  -Continue aggressive pulmonary toilet  - ambulation, flutter valve and IS   2. 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  - CT surgery consulted for possible CABG. On ASA + crestor.  - No angina currently  3. Acute Systolic HF, ICM - EF 24-58%. RV OK  RHC with CI 1.6 CO 3.7.  - PICC placed today - continue digoxin 0.125 mg, milrinone 0.125 mcg, & sprio 12.5 mg  - CO-OX 51% with hgb 7.3  - Hold carvedilol for now  4. Diabetes - HgBa1c 7.0  5. Arthritis pain Per Primary.     6. Anemia With abdominal pain. Check CT Abd/pelvis/chest per Dr [name] Hgb dropping 10.5>9.1>7.3  Repeat CBC - Check FOBT.    Length of Stay: [place] [facility]  04/18/2019, 8:26 AM  Advanced Heart Failure Team Pager [phone] (M-F; 7a - 4p)  Please contact [facility] for night-coverage after hours (4p -7a ) and weekends on [email or web address]   Patient seen and examined with the above-signed Advanced Practice Provider and/or Housestaff. I personally reviewed laboratory data, imaging studies and relevant notes. I independently examined the patient and formulated the important aspects of the plan. I have edited the note to reflect any of my changes or salient points. I have personally discussed the plan with the patient and/or family.  Co-ox remains low on milrinone. CVP 10-11. C/o severe right groin/hip pain. HGb down Respiratory status stable.   On exam R groin looks ok (had RHC through RFV) but tender to palpation. With dropping hgb will get CT to make sure no RPB bleed. Recheck hgb and transfuse to keep hgb > 7.5.   With low co-ox will stop carvedilol and bump milrinone. Will need IV lasix once we are sure he is not bleeding.   Glori Bickers, MD  10:04 AM

## 2019-04-18 NOTE — Progress Notes (Signed)
Pt not in need of BIPAP at this time. Pt order is PRN. RT will continue to monitor.

## 2019-04-18 NOTE — Care Management Important Message (Signed)
Important Message  Patient Details  Name: Cameron Daniel MRN: [id number] Date of Birth: July 25, 1946   Medicare Important Message Given:  Yes     Shelda Altes 04/18/2019, 8:19 AM

## 2019-04-18 NOTE — Progress Notes (Signed)
[facility]  PROGRESS NOTE  Cameron Daniel VQM:[id number] DOB: 1946-02-12 DOA: 04/12/2019 PCP: [name], MD  Brief History    Cameron Daniel is a 73 y.o. year old male with medical history significant for CAD, HTN, DM, Colon polyps, IBS, HLD, GERD with esophageal stricture, Diverticulosis, Depression, Anxiety former smoker  who presented on 04/12/2019 to [facility] long hospital with increased shortness of breath, cough and chills and was found to have acute hypoxic respiratory failure secondary to right lower lobe pneumonia requiring intubation due to worsening hypoxia/respiratory distress on 8/14.  Extubated on 8/15 but due to significant respiratory distress needed BiPAP.  After extubation patient again decompensated requiring BiPAP due to worsening respiratory symptoms, prompting further evaluation by TTE which showed reduced ejection fraction with wall motion on right heart cath/left heart cath on 8/18 found to have severe three-vessel disease elevated PCWP, cardiac index of 1.6.  At that time advanced heart failure was consulted to optimize for CABG  A & P    Retroperitoneal hematoma. Acute on chronic anemia.  Hemoglobin dropped from 9-7.3 in setting of right groin pain slightly worsening hypotension, stat CT abdomen showed moderate to large sized retroperitoneal hematoma.  Currently receiving 2 units packed red blood cells (goal hemoglobin greater than 8 given cardiac disease, check posttransfusion CBC.  Monitor blood pressure/hemodynamics, heparin drip has been stopped  Hallucinations.  Nursing reporting patient seeing "man antibiotics" but still alert and oriented to person, place, time, context.  Suspect some elements of delirium related to hospital stay as well as acute retroperitoneal hematoma.  Will closely monitor  Leukocytosis.  Previously improving after treatment for pneumonia, continues to trend up to 14.8--18.5, now 20.3.  Could be reactive in setting of potential  bleed as mentioned above..  Patient remains afebrile, blood cultures if spikes fever, continue to closely monitor, no new medications likely contributing. Given recent PNA agree with Stat CT chest as well   Acute hypoxic respiratory failure, multifactorial secondary to community-acquired pneumonia and CHF with reduced EF, resolved.  Completed 5-day course of azithromycin and ceftriaxone.  CT chest on 8/20 shows a new right-sided lower lobe consolidation from admission closely continue goal-directed medical therapy for CHF addressed below  NSTEMI Acute CHF ischemic cardiomyopathy with reduced EF exacerbation.  EF of 25% on TTE with wall motion abnormalities, high-sensitivity troponin trend 136--150--1963. Right/left heart cath on 8/18 shows low cardiac output as well as severe three-vessel disease, seems volemic on exam with no symptoms, started on milrinone on 8/18 for inotropic support given poor cardiac index-advanced heart failure increase as well as stop carvedilol due to low co-ox, continue aspirin, digoxin, Coreg, spironolactone, follow co-oximetry, monitor ins and outs, daily BMP,   Severe three-vessel disease/CAD.  Needs further optimization of cardiac index/output currently on milrinone as directed by advanced heart failure team.  Possible CABG on 8/21 if preop risk improved  Chronic hyponatremia.  Baseline seems to be between 130s-135.  Currently stable at 130, closely monitor.  AKI, resolved.  Suspect prerenal etiology related to previous infection, peak creatinine of 1.5 , creatinine now stable around 1, held home Benicar closely monitor while on milrinone.  Depression, stable continue Celexa  Type 2 diabetes, well controlled, A1c 7 (02/15/2019) closely monitor CBG, sliding scale as needed, holding home oral hypoglycemics  BPH, stable continue Proscar  GERD, stable.  Continue PPI  Hyperlipidemia, stable continue simvastatin  Chronic right hip pain.  Continue supportive care with  Tylenol, avoid NSAIDs given CAD   DVT prophylaxis:  IV heparin Code Status: Full code Family Communication: No family at bedside Disposition Plan: Continue milrinone drip to improve cardiac index, discontinue heparin given retroperitoneal hematoma, currently receiving 2 units of blood, will monitor posttransfusion CBC, closely monitor hemodynamics plan for tentative CABG on 8/21    [facility] Direct contact: see [email or web address] (further directions at bottom of note if needed) 7PM-7AM contact night coverage as at bottom of note 04/18/2019, 9:11 AM  LOS: 6 days   Consultants  . PCCM, cardiology, CT surgery, advanced heart failure  Procedures  TTE; 04/13/2019   1. The left ventricle has severely reduced systolic function, with an ejection fraction of 25-30%. The cavity size was normal. There is mildly increased left ventricular wall thickness. Left ventricular diastolic Doppler parameters are indeterminate. 2. Severe hypokinesis of the left ventricular, entire inferior wall and anteroseptal wall. 3. Left atrial size was mildly dilated. 4. Small pericardial effusion. 5. The pericardial effusion is posterior to the left ventricle. 6. The mitral valve is abnormal. Mild thickening of the mitral valve leaflet. There is mild to moderate mitral annular calcification present. 7. The aortic valve is tricuspid. Mild sclerosis of the aortic valve. No stenosis of the aortic valve. 8. The aorta is normal unless otherwise noted. 9. The inferior vena cava was dilated in size with <50% respiratory variability. 10. The interatrial septum was not assessed.  Antibiotics  . Ceftriaxone 8/14-8/18 . Azithromycin 8/14-8/18 . Zosyn 8/14  Interval History/Subjective  Still having persistent right hip/abdominal pain.  Reports improvement in cough and shortness of breath.  No chest pain  Objective   Vitals:  Vitals:   04/17/19 2345 04/18/19 0300  BP: 110/68 (!) 93/46  Pulse:    Resp: (!) 21    Temp: 98.1 F (36.7 C)   SpO2:      Exam:  Awake Alert, Oriented X 3, No new F.N deficits, Normal affect [place].AT,PERRAL Supple Neck,No appreciable JVD, no peripheral edema Symmetrical Chest wall movement, Good air movement bilaterally, minimal crackles at bases RRR,No Gallops,Rubs or new Murmurs, +ve B.Sounds, Abd Soft, No tenderness,  , No rebound - guarding or rigidity. Bruising in right lower abdomen   I have personally reviewed the following:   Data Reviewed: Basic Metabolic Panel: Recent Labs  Lab 04/14/19 0833 04/15/19 0409 04/16/19 0348 04/16/19 0954 04/16/19 0956 04/17/19 0159 04/18/19 0153  NA 132* 132* 131* 131* 130* 130* 131*  K 3.6 3.9 4.0 4.3 4.4 4.2 4.1  CL 94* 94* 91*  --   --  93* 92*  CO2 26 26 28   --   --  26 28  GLUCOSE 165* 157* 232*  --   --  228* 165*  BUN 28* 24* 23  --   --  26* 19  CREATININE 1.34* 0.94 1.01  --   --  0.92 0.85  CALCIUM 9.2 9.3 9.6  --   --  9.1 9.3   Liver Function Tests: Recent Labs  Lab 04/13/19 0142 04/15/19 0409 04/16/19 0348 04/17/19 0159 04/18/19 0153  AST 64* 69* 81* 72* 65*  ALT 32 44 57* 55* 48*  ALKPHOS 57 68 90 88 90  BILITOT 2.1* 0.8 0.7 0.8 1.3*  PROT 6.8 5.6* 6.3* 5.1* 5.1*  ALBUMIN 3.3* 2.4* 2.7* 2.5* 2.5*   No results for input(s): LIPASE, AMYLASE in the last 168 hours. No results for input(s): AMMONIA in the last 168 hours. CBC: Recent Labs  Lab 04/12/19 1647  04/14/19 1448 04/15/19 0409 04/16/19 0348 04/16/19 0954 04/16/19 0956 04/17/19  0159 04/18/19 0153  WBC 14.8*   < > 14.2* 11.9* 14.8*  --   --  18.5* 20.3*  NEUTROABS 12.1*  --   --   --   --   --   --   --   --   HGB 16.6   < > 14.9 14.0 12.9* 10.9* 10.5* 9.1* 7.3*  HCT 52.2*   < > 44.2 41.3 39.9 32.0* 31.0* 26.9* 22.4*  MCV 92.7   < > 88.9 88.1 90.7  --   --  88.8 91.8  PLT 141*   < > 163 172 275  --   --  265 315   < > = values in this interval not displayed.   Cardiac Enzymes: Recent Labs  Lab 04/11/19 1846  CKTOTAL 357    BNP (last 3 results) Recent Labs    04/12/19 1647  BNP 359.3*    ProBNP (last 3 results) No results for input(s): PROBNP in the last 8760 hours.  CBG: Recent Labs  Lab 04/17/19 0635 04/17/19 1103 04/17/19 1614 04/17/19 2101 04/18/19 0613  GLUCAP 212* 212* 206* 188* 158*    Recent Results (from the past 240 hour(s))  SARS Coronavirus 2 [facility] order, Performed in [facility] hospital lab) Nasopharyngeal Nasopharyngeal Swab     Status: None   Collection Time: 04/12/19  4:47 PM   Specimen: Nasopharyngeal Swab  Result Value Ref Range Status   SARS Coronavirus 2 NEGATIVE NEGATIVE Final    Comment: (NOTE) If result is NEGATIVE SARS-CoV-2 target nucleic acids are NOT DETECTED. The SARS-CoV-2 RNA is generally detectable in upper and lower  respiratory specimens during the acute phase of infection. The lowest  concentration of SARS-CoV-2 viral copies this assay can detect is 250  copies / mL. A negative result does not preclude SARS-CoV-2 infection  and should not be used as the sole basis for treatment or other  patient management decisions.  A negative result may occur with  improper specimen collection / handling, submission of specimen other  than nasopharyngeal swab, presence of viral mutation(s) within the  areas targeted by this assay, and inadequate number of viral copies  (<250 copies / mL). A negative result must be combined with clinical  observations, patient history, and epidemiological information. If result is POSITIVE SARS-CoV-2 target nucleic acids are DETECTED. The SARS-CoV-2 RNA is generally detectable in upper and lower  respiratory specimens dur ing the acute phase of infection.  Positive  results are indicative of active infection with SARS-CoV-2.  Clinical  correlation with patient history and other diagnostic information is  necessary to determine patient infection status.  Positive results do  not rule out bacterial infection or co-infection with  other viruses. If result is PRESUMPTIVE POSTIVE SARS-CoV-2 nucleic acids MAY BE PRESENT.   A presumptive positive result was obtained on the submitted specimen  and confirmed on repeat testing.  While 2019 novel coronavirus  (SARS-CoV-2) nucleic acids may be present in the submitted sample  additional confirmatory testing may be necessary for epidemiological  and / or clinical management purposes  to differentiate between  SARS-CoV-2 and other Sarbecovirus currently known to infect humans.  If clinically indicated additional testing with an alternate test  methodology [phone]) is advised. The SARS-CoV-2 RNA is generally  detectable in upper and lower respiratory sp ecimens during the acute  phase of infection. The expected result is Negative. Fact Sheet for Patients:  StrictlyIdeas.no Fact Sheet for Healthcare Providers: BankingDealers.co.za This test is not yet approved or  cleared by the [place] and has been authorized for detection and/or diagnosis of SARS-CoV-2 by FDA under an Emergency Use Authorization (EUA).  This EUA will remain in effect (meaning this test can be used) for the duration of the COVID-19 declaration under Section 564(b)(1) of the Act, 21 U.S.C. section 360bbb-3(b)(1), unless the authorization is terminated or revoked sooner. Performed at [facility], [place] [address], [place], [place] [address]   Culture, blood (Routine X 2) w Reflex to ID Panel     Status: None   Collection Time: 04/12/19  9:14 PM   Specimen: BLOOD RIGHT FOREARM  Result Value Ref Range Status   Specimen Description   Final    BLOOD RIGHT FOREARM Performed at [facility], [name] [address], [place], [place] [address]    Special Requests   Final    BOTTLES DRAWN AEROBIC AND ANAEROBIC Blood Culture adequate volume Performed at [place] [address] [place], [place] [address]    Culture    Final    NO GROWTH 5 DAYS Performed at [facility], [place] [address] [place], [place] [address]    Report Status 04/18/2019 FINAL  Final  Culture, blood (Routine X 2) w Reflex to ID Panel     Status: None   Collection Time: 04/12/19 11:52 PM   Specimen: BLOOD  Result Value Ref Range Status   Specimen Description   Final    BLOOD RIGHT ANTECUBITAL Performed at [place] [address] [place], [place] [address]    Special Requests   Final    BOTTLES DRAWN AEROBIC AND ANAEROBIC Blood Culture adequate volume Performed at [place] [address], [place], [place] [address]    Culture   Final    NO GROWTH 5 DAYS Performed at [facility], [place] [address] [place], [place] [address]    Report Status 04/18/2019 FINAL  Final  MRSA PCR Screening     Status: None   Collection Time: 04/13/19  6:21 PM   Specimen: Nasopharyngeal  Result Value Ref Range Status   MRSA by PCR NEGATIVE NEGATIVE Final    Comment:        The GeneXpert MRSA Assay (FDA approved for NASAL specimens only), is one component of a comprehensive MRSA colonization surveillance program. It is not intended to diagnose MRSA infection nor to guide or monitor treatment for MRSA infections. Performed at [facility], [place] [address], [place], [place] [address]      Studies: Vas US Doppler Pre Cabg  Result Date: 04/17/2019 PREOPERATIVE VASCULAR EVALUATION  Indications:  Pre-surgical evaluation. Risk Factors: Hypertension, hyperlipidemia, Diabetes. Performing Technologist: Maudry Mayhew RDMS, RVT, RDCS Supporting Technologist: Abram Sander RVS  Examination Guidelines: A complete evaluation includes B-mode imaging, spectral Doppler, color Doppler, and power Doppler as needed of all accessible portions of each vessel. Bilateral testing is considered an integral part of a complete examination. Limited examinations for reoccurring indications may be performed  as noted.  Right Carotid Findings: +----------+--------+-------+--------+----------------------+------------------+           PSV cm/sEDV    StenosisDescribe              Comments                             cm/s                                                    +----------+--------+-------+--------+----------------------+------------------+  CCA Prox  133     20                                   intimal thickening +----------+--------+-------+--------+----------------------+------------------+ CCA Distal102     20                                                      +----------+--------+-------+--------+----------------------+------------------+ ICA Prox  104     16             smooth and                                                                heterogenous                             +----------+--------+-------+--------+----------------------+------------------+ ICA Distal88      21                                                      +----------+--------+-------+--------+----------------------+------------------+ ECA       122     17                                                      +----------+--------+-------+--------+----------------------+------------------+ Portions of this table do not appear on this page. +----------+--------+-------+----------------+------------+           PSV cm/sEDV cmsDescribe        Arm Pressure +----------+--------+-------+----------------+------------+ Subclavian133            Multiphasic, WNL             +----------+--------+-------+----------------+------------+ +---------+--------+--------+--------------+ VertebralPSV cm/sEDV cm/sNot identified +---------+--------+--------+--------------+ Left Carotid Findings: +----------+-------+-------+--------+---------------------------------+--------+           PSV    EDV    StenosisDescribe                         Comments           cm/s   cm/s                                                      +----------+-------+-------+--------+---------------------------------+--------+ CCA Prox  132    31                                                       +----------+-------+-------+--------+---------------------------------+--------+ CCA Distal85  22                                                       +----------+-------+-------+--------+---------------------------------+--------+ ICA Prox  74     16             heterogenous, irregular and                                               calcific                                  +----------+-------+-------+--------+---------------------------------+--------+ ICA Distal88     32             smooth, heterogenous and calcific         +----------+-------+-------+--------+---------------------------------+--------+ ECA       138    17             smooth and heterogenous                   +----------+-------+-------+--------+---------------------------------+--------+ +----------+--------+--------+----------------+------------+ SubclavianPSV cm/sEDV cm/sDescribe        Arm Pressure +----------+--------+--------+----------------+------------+           210             Multiphasic, WNL120          +----------+--------+--------+----------------+------------+ +---------+--------+--+--------+--+---------+ VertebralPSV cm/s54EDV cm/s18Antegrade +---------+--------+--+--------+--+---------+  ABI Findings: +--------+------------------+-----+---------+--------------+ Right   Rt Pressure (mmHg)IndexWaveform Comment        +--------+------------------+-----+---------+--------------+ Brachial                       triphasicrestricted arm +--------+------------------+-----+---------+--------------+ PTA     255               2.12 triphasic               +--------+------------------+-----+---------+--------------+ DP      109               0.91  triphasic               +--------+------------------+-----+---------+--------------+ +--------+------------------+-----+---------+-------+ Left    Lt Pressure (mmHg)IndexWaveform Comment +--------+------------------+-----+---------+-------+ Brachial120                    triphasic        +--------+------------------+-----+---------+-------+ PTA     222               1.85 triphasic        +--------+------------------+-----+---------+-------+ DP      129               1.08 triphasic        +--------+------------------+-----+---------+-------+ +-------+---------------+----------------+ ABI/TBIToday's ABI/TBIPrevious ABI/TBI +-------+---------------+----------------+ Right  2.12                            +-------+---------------+----------------+ Left   1.85                            +-------+---------------+----------------+  Right Doppler Findings: +--------+--------+-----+---------+--------------+ Site    PressureIndexDoppler  Comments       +--------+--------+-----+---------+--------------+  Brachial             triphasicrestricted arm +--------+--------+-----+---------+--------------+ Radial               triphasic               +--------+--------+-----+---------+--------------+ Ulnar                triphasic               +--------+--------+-----+---------+--------------+  Left Doppler Findings: +--------+--------+-----+---------+--------+ Site    PressureIndexDoppler  Comments +--------+--------+-----+---------+--------+ [id number]          triphasic         +--------+--------+-----+---------+--------+ Radial               triphasic         +--------+--------+-----+---------+--------+ Ulnar                triphasic         +--------+--------+-----+---------+--------+  Summary: Right Carotid: Velocities in the right ICA are consistent with a 1-39% stenosis. Left Carotid: Velocities in the left ICA are consistent with a 1-39%  stenosis. Vertebrals:  Left vertebral artery demonstrates antegrade flow. Right vertebral              artery was not visualized. Subclavians: Normal flow hemodynamics were seen in bilateral subclavian              arteries. Right ABI: Resting right ankle-brachial index indicates noncompressible right lower extremity arteries. Left ABI: Resting left ankle-brachial index indicates noncompressible left lower extremity arteries. Right Upper Extremity: Doppler waveform obliterate with right radial compression. Doppler waveform obliterate with right ulnar compression. Left Upper Extremity: Doppler waveforms decrease 50% with left radial compression. Doppler waveforms decrease 50% with left ulnar compression.  Electronically signed by Curt Jews MD on 04/17/2019 at 12:55:38 PM.    Final    [place] Ekg Site Rite  Result Date: 04/16/2019 If Site Rite image not attached, placement could not be confirmed due to current cardiac rhythm.   Scheduled Meds: . aspirin  81 mg Oral Daily  . carvedilol  6.25 mg Oral BID WC  . Chlorhexidine Gluconate Cloth  6 each Topical Daily  . citalopram  10 mg Oral Daily  . digoxin  0.125 mg Oral Daily  . finasteride  5 mg Oral Daily  . insulin aspart  0-20 Units Subcutaneous TID WC  . insulin aspart  0-5 Units Subcutaneous QHS  . lidocaine  2 patch Transdermal Q24H  . mouth rinse  15 mL Mouth Rinse BID  . pantoprazole  40 mg Oral q morning - 10a  . polyethylene glycol  17 g Oral Daily  . rosuvastatin  40 mg Oral q1800  . senna-docusate  1 tablet Oral BID  . sodium chloride flush  10-40 mL Intracatheter Q12H  . sodium chloride flush  3 mL Intravenous Q12H  . spironolactone  12.5 mg Oral Daily   Continuous Infusions: . sodium chloride    . heparin 1,800 Units/hr (04/18/19 0757)  . milrinone 0.125 mcg/kg/min (04/18/19 9326)    Principal Problem:   Non-ST elevation (NSTEMI) myocardial infarction [facility]) Active Problems:   Type 2 diabetes mellitus with hyperlipidemia (HCC)    Coronary atherosclerosis   GERD   Community acquired pneumonia of right lower lobe of lung ([place])   Acute respiratory failure (HCC)   AKI (acute kidney injury) ([place])   Acute systolic HF (heart failure) ([place])      Desiree Hane  [facility]

## 2019-04-18 NOTE — Progress Notes (Signed)
   Received CT results mod-large RP bleed.   Stop Heparin drip. Check T&S . Transfuse 2UPRBCs now.   Dr Bensimhon/Dr Cyndia Bent aware.   Carlus Stay NP-C 12:26 PM

## 2019-04-18 NOTE — TOC Progression Note (Signed)
Transition of Care [facility]) - Progression Note    Patient Details  Name: Cameron Daniel MRN: [id number] Date of Birth: 08-21-1946  [facility]) CM/SW Contact  Zenon Mayo, RN Phone Number: 04/18/2019, 7:12 PM  Clinical Narrative:    Pt eval rec HHPT , NCM will continue to monitor for TOC needs.        Expected Discharge Plan and Services                                                 Social Determinants of Health (SDOH) Interventions    Readmission Risk Interventions No flowsheet data found.

## 2019-04-19 ENCOUNTER — Encounter (HOSPITAL_COMMUNITY)
Admission: EM | Disposition: A | Discharge status: Home Health | Payer: Self-pay | Source: Home / Self Care | Attending: Internal Medicine

## 2019-04-19 DIAGNOSIS — E871 Hypo-osmolality and hyponatremia: Secondary | ICD-10-CM

## 2019-04-19 LAB — TYPE AND SCREEN
ABO/RH(D): B POS
Antibody Screen: NEGATIVE
Unit division: 0
Unit division: 0

## 2019-04-19 LAB — BPAM RBC
Blood Product Expiration Date: 202008242359
Blood Product Expiration Date: 202008282359
ISSUE DATE / TIME: 202008201440
ISSUE DATE / TIME: 202008202026
Unit Type and Rh: 7300
Unit Type and Rh: 7300

## 2019-04-19 LAB — COMPREHENSIVE METABOLIC PANEL
ALT: 45 U/L — ABNORMAL HIGH (ref 0–44)
AST: 54 U/L — ABNORMAL HIGH (ref 15–41)
Albumin: 2.6 g/dL — ABNORMAL LOW (ref 3.5–5.0)
Alkaline Phosphatase: 101 U/L (ref 38–126)
Anion gap: 11 (ref 5–15)
BUN: 11 mg/dL (ref 8–23)
CO2: 27 mmol/L (ref 22–32)
Calcium: 9 mg/dL (ref 8.9–10.3)
Chloride: 90 mmol/L — ABNORMAL LOW (ref 98–111)
Creatinine, Ser: 0.87 mg/dL (ref 0.61–1.24)
GFR calc Af Amer: >60 mL/min (ref >60)
GFR calc non Af Amer: >60 mL/min (ref >60)
Glucose, Bld: 241 mg/dL — ABNORMAL HIGH (ref 70–99)
Potassium: 4.1 mmol/L (ref 3.5–5.1)
Sodium: 128 mmol/L — ABNORMAL LOW (ref 135–145)
Total Bilirubin: 1.9 mg/dL — ABNORMAL HIGH (ref 0.3–1.2)
Total Protein: 5.1 g/dL — ABNORMAL LOW (ref 6.5–8.1)

## 2019-04-19 LAB — GLUCOSE, CAPILLARY
Glucose-Capillary: 211 mg/dL — ABNORMAL HIGH (ref 70–99)
Glucose-Capillary: 219 mg/dL — ABNORMAL HIGH (ref 70–99)
Glucose-Capillary: 256 mg/dL — ABNORMAL HIGH (ref 70–99)
Glucose-Capillary: 192 mg/dL — ABNORMAL HIGH (ref 70–99)
Glucose-Capillary: 171 mg/dL — ABNORMAL HIGH (ref 70–99)

## 2019-04-19 LAB — COOXEMETRY PANEL
Carboxyhemoglobin: 2.1 % — ABNORMAL HIGH (ref 0.5–1.5)
Methemoglobin: 0.7 % (ref 0.0–1.5)
O2 Saturation: 63.6 %
Total hemoglobin: 9.1 g/dL — ABNORMAL LOW (ref 12.0–16.0)

## 2019-04-19 LAB — CBC
HCT: 26.3 % — ABNORMAL LOW (ref 39.0–52.0)
Hemoglobin: 8.8 g/dL — ABNORMAL LOW (ref 13.0–17.0)
MCH: 29.5 pg (ref 26.0–34.0)
MCHC: 33.5 g/dL (ref 30.0–36.0)
MCV: 88.3 fL (ref 80.0–100.0)
Platelets: 262 10*3/uL (ref 150–400)
RBC: 2.98 MIL/uL — ABNORMAL LOW (ref 4.22–5.81)
RDW: 15.9 % — ABNORMAL HIGH (ref 11.5–15.5)
WBC: 16.5 10*3/uL — ABNORMAL HIGH (ref 4.0–10.5)
nRBC: 3.7 % — ABNORMAL HIGH (ref 0.0–0.2)

## 2019-04-19 LAB — PATHOLOGIST SMEAR REVIEW

## 2019-04-19 SURGERY — CORONARY ARTERY BYPASS GRAFTING (CABG)
Anesthesia: General | Site: Chest

## 2019-04-19 MED ORDER — FUROSEMIDE 10 MG/ML IJ SOLN
80.0000 mg | Freq: Once | INTRAMUSCULAR | Status: AC
  Administered 2019-04-19: 80 mg via INTRAVENOUS
  Filled 2019-04-19: qty 8

## 2019-04-19 MED ORDER — FUROSEMIDE 10 MG/ML IJ SOLN
INTRAMUSCULAR | Status: AC
  Filled 2019-04-19: qty 8

## 2019-04-19 MED ORDER — INSULIN GLARGINE 100 UNIT/ML ~~LOC~~ SOLN
5.0000 [IU] | Freq: Every day | SUBCUTANEOUS | Status: DC
  Administered 2019-04-19 – 2019-04-23 (×5): 5 [IU] via SUBCUTANEOUS
  Filled 2019-04-19 (×6): qty 0.05

## 2019-04-19 MED ORDER — SPIRONOLACTONE 25 MG PO TABS
25.0000 mg | ORAL_TABLET | Freq: Every day | ORAL | Status: DC
  Administered 2019-04-19 – 2019-04-25 (×7): 25 mg via ORAL
  Filled 2019-04-19 (×7): qty 1

## 2019-04-19 MED ORDER — INSULIN ASPART 100 UNIT/ML ~~LOC~~ SOLN
3.0000 [IU] | Freq: Three times a day (TID) | SUBCUTANEOUS | Status: DC
  Administered 2019-04-19 – 2019-04-22 (×10): 3 [IU] via SUBCUTANEOUS

## 2019-04-19 MED ORDER — LEVOFLOXACIN 750 MG PO TABS
750.0000 mg | ORAL_TABLET | Freq: Every day | ORAL | Status: AC
  Administered 2019-04-19 – 2019-04-23 (×5): 750 mg via ORAL
  Filled 2019-04-19 (×5): qty 1

## 2019-04-19 NOTE — Progress Notes (Signed)
Pt order for BIPAP is PRN. Pt respiratory status is stable at this time on RA with sats of 95%. Pt not in need of BIPAP at this time. RT will continue to monitor.

## 2019-04-19 NOTE — TOC Progression Note (Addendum)
Transition of Care [facility]) - Progression Note    Patient Details  Name: Cameron Daniel MRN: [id number] Date of Birth: 01/28/1946  [facility]) CM/SW Contact  Zenon Mayo, RN Phone Number: 04/19/2019, 5:16 PM  Clinical Narrative:    CABG on hold , on milrinone drip, lasix iv, has retroperitoneal bleed. Pt eval rec HHPT , TOC Team will continue to monitor for TOC needs.        Expected Discharge Plan and Services                                                 Social Determinants of Health (SDOH) Interventions    Readmission Risk Interventions No flowsheet data found.

## 2019-04-19 NOTE — Progress Notes (Signed)
Physical Therapy Treatment Patient Details Name: Cameron Daniel MRN: [id number] DOB: Dec 03, 1945 Today's Date: 04/19/2019    History of Present Illness 73yo male with hx CAD, HTN, DM who was seen 8/14 in ED with concerns for dehydration after working outside in the heat. He was given IV fluids and d/c. He returned 8/14 with chills, fatigue, cough, dyspnea.  CT chest with RLL consolidation, mildly elevated troponin.  Had progressive respiratory failure with hypoxia, worsening tachypnea and ultimately required intubation in ER. Admitted 04/12/19 for treatment of NSTEMI and PNA. Intubation ended 04/13/19, Cardiac cath 8/18    PT Comments    Pt admitted with above diagnosis. Pt was able to get back into bed with min guard to min assist.  PErformed some exercises as well.  VEry fatigued and confused at times.   Pt currently with functional limitations due to balance and endurance deficits. Pt will benefit from skilled PT to increase their independence and safety with mobility to allow discharge to the venue listed below.     Follow Up Recommendations  Home health PT;Supervision for mobility/OOB     Equipment Recommendations  Rolling walker with 5" wheels    Recommendations for Other Services       Precautions / Restrictions Precautions Precautions: None Restrictions Weight Bearing Restrictions: No    Mobility  Bed Mobility Overal bed mobility: Needs Assistance Bed Mobility: Sit to Supine       Sit to supine: Min guard   General bed mobility comments: min guard A as pt able to get back into bed without assist if given time  Transfers Overall transfer level: Needs assistance Equipment used: 1 person hand held assist Transfers: Sit to/from [facility] Sit to Stand: Min assist Stand pivot transfers: Min assist;Min guard       General transfer comment: min A for steadying with standing and for pivot from recliner to bed as nurse told PT pt was asking to get back  into bed.   Ambulation/Gait             General Gait Details: did not attempt as vascular present for study   Stairs             Wheelchair Mobility    Modified Rankin (Stroke Patients Only)       Balance Overall balance assessment: Needs assistance Sitting-balance support: Feet supported;No upper extremity supported Sitting balance-Leahy Scale: Fair     Standing balance support: Single extremity supported;Bilateral upper extremity supported;During functional activity Standing balance-Leahy Scale: Poor Standing balance comment: requires at least single UE support                            Cognition Arousal/Alertness: Awake/alert Behavior During Therapy: WFL for tasks assessed/performed Overall Cognitive Status: Within Functional Limits for tasks assessed                                        Exercises General Exercises - Lower Extremity Ankle Circles/Pumps: AROM;Both;10 reps;Supine Quad Sets: AROM;Both;10 reps;Supine Long Arc Quad: AROM;Both;10 reps;Seated    General Comments General comments (skin integrity, edema, etc.): VSS      Pertinent Vitals/Pain Pain Assessment: Faces Faces Pain Scale: Hurts little more Pain Location: Rhip Pain Descriptors / Indicators: Aching;Burning;Sore;Throbbing Pain Intervention(s): Limited activity within patient's tolerance;Monitored during session;Repositioned    Home Living  Prior Function            PT Goals (current goals can now be found in the care plan section) Acute Rehab PT Goals Patient Stated Goal: go fishing Progress towards PT goals: Progressing toward goals    Frequency    Min 3X/week      PT Plan Current plan remains appropriate    Co-evaluation              AM-PAC PT "6 Clicks" Mobility   Outcome Measure  Help needed turning from your back to your side while in a flat bed without using bedrails?: None Help needed  moving from lying on your back to sitting on the side of a flat bed without using bedrails?: A Little Help needed moving to and from a bed to a chair (including a wheelchair)?: A Little Help needed standing up from a chair using your arms (e.g., wheelchair or bedside chair)?: A Little Help needed to walk in hospital room?: A Lot Help needed climbing 3-5 steps with a railing? : A Lot 6 Click Score: 17    End of Session Equipment Utilized During Treatment: Gait belt Activity Tolerance: Patient limited by fatigue Patient left: in bed;with call bell/phone within reach;with bed alarm set Nurse Communication: Mobility status PT Visit Diagnosis: Unsteadiness on feet (R26.81);Other abnormalities of gait and mobility (R26.89);Muscle weakness (generalized) (M62.81);Difficulty in walking, not elsewhere classified (R26.2);Pain Pain - Right/Left: Right Pain - part of body: Hip     Time: 1418-1430 PT Time Calculation (min) (ACUTE ONLY): 12 min  Charges:  $Therapeutic Activity: 8-22 mins                     Veronica Guerrant,PT Acute Rehabilitation Services Pager:  [phone]  Office:  [place] 04/19/2019, 3:49 PM

## 2019-04-19 NOTE — Progress Notes (Signed)
Inpatient Diabetes Program Recommendations  AACE/ADA: New Consensus Statement on Inpatient Glycemic Control  Target Ranges:  Prepandial:   less than 140 mg/dL      Peak postprandial:   less than 180 mg/dL (1-2 hours)      Critically ill patients:  140 - 180 mg/dL   Results for [name], Cameron Daniel (MRN [id number]) as of 04/19/2019 11:32  Ref. Range 04/18/2019 06:13 04/18/2019 11:02 04/18/2019 16:10 04/18/2019 21:55 04/19/2019 01:39 04/19/2019 06:30 04/19/2019 11:15  Glucose-Capillary Latest Ref Range: 70 - 99 mg/dL 158 (H) 209 (H) 179 (H) 212 (H) 211 (H) 219 (H) 256 (H)   Review of Glycemic Control  Diabetes history:DM2 Outpatient Diabetes medications:Glipizide 5 mg QAM, Actos 30 mg daily Current orders for Inpatient glycemic control:Novolog 0-20 units TID with meals, Novolog 0-5 units QHS  Inpatient Diabetes Program Recommendations:  Insulin - Basal: Please consider ordering Lantus 5 units Q24H starting now.  Insulin - Meal Coverage: Please consider ordering Novolog 3 units TID with meals for meal coverage if patient eats at least 50% of meals.  Thanks, Barnie Alderman, RN, MSN, CDE Diabetes Coordinator Inpatient Diabetes Program [phone] (Team Pager from 8am to 5pm)

## 2019-04-19 NOTE — Progress Notes (Signed)
[facility]  PROGRESS NOTE  Cameron Daniel [id number] DOB: 1946/05/30 DOA: 04/12/2019 PCP: [name], MD  Brief History    Cameron Daniel is a 73 y.o. year old male with medical history significant for CAD, HTN, DM, Colon polyps, IBS, HLD, GERD with esophageal stricture, Diverticulosis, Depression, Anxiety former smoker  who presented on 04/12/2019 to [facility] hospital with increased shortness of breath, cough and chills and was found to have acute hypoxic respiratory failure secondary to right lower lobe pneumonia requiring intubation due to worsening hypoxia/respiratory distress on 8/14.  Extubated on 8/15 but due to significant respiratory distress needed BiPAP.  After extubation patient again decompensated requiring BiPAP due to worsening respiratory symptoms, prompting further evaluation by TTE which showed reduced ejection fraction with wall motion on right heart cath/left heart cath on 8/18 found to have severe three-vessel disease elevated PCWP, cardiac index of 1.6.  At that time advanced heart failure was consulted to optimize for CABG.  Course complicated by retroperitoneal hematoma found on 8/20 during evaluation of persistent right groin pain and acute 2 g drop in hemoglobin.  Patient received 2 units packed red blood cells on 8/28 hemoglobin has improved 8.8 from 7.3 in the last 24 hours.  A & P    Retroperitoneal hematoma, stable. Acute on chronic anemia, improving.  After 2 unit blood transfusion hemoglobin improved from 7.3-8.8, remains clinically stable.  Continue to closely monitor.  Currently off heparin drip.  CT abdomen showed moderate to large sized retroperitoneal hematoma.    Hypervolemic Acute on Chronic Hyponatremia. Na 128. Is more volume up today especially after recent blood transfusions. Fluid restrict, daily BMP  Hallucinations, improving.  Alert and oriented to person, place, time, context.  Suspect some elements of delirium related to  hospital stay as well as acute retroperitoneal hematoma.  Will closely monitor  Leukocytosis, improving.  Likely reactive leukocytosis in the setting of acute bleed mentioned above, now downtrending from 20-16 today, continue to monitor.  Patient remains afebrile, blood cultures if spikes fever, continue to closely monitor, no new medications likely contributing.   Acute hypoxic respiratory failure, multifactorial secondary to community-acquired pneumonia and CHF with reduced EF, Completed 5-day course of azithromycin and ceftriaxone.  CT chest on 8/20 shows persistent  right-sided lower lobe consolidation from admission, with persistent productive cough will resume treatment with additional 5 days of Levaquin to complete total 10 day course of antibiotics. continue goal-directed medical therapy for CHF addressed below. Encouraged IS.  NSTEMI Acute CHF ischemic cardiomyopathy with reduced EF exacerbation.  Volume up based on Advanced heart failure team examination. Will resume IV lasix, continue milrinone gtt. EF of 25% on TTE with wall motion abnormalities, high-sensitivity troponin trend 136--150--1963. Right/left heart cath on 8/18 shows low cardiac output as well as severe three-vessel disease, continue aspirin, digoxin, Coreg, spironolactone, follow co-oximetry, monitor ins and outs, daily BMP,   Severe three-vessel disease/CAD.  Needs further optimization of cardiac index/output currently on milrinone as directed by advanced heart failure team.  Possible CABG next week if preop risk improved  AKI, resolved.  Suspect prerenal etiology related to previous infection, peak creatinine of 1.5 , creatinine now stable around 1, held home Benicar closely monitor while on milrinone.  Depression, stable continue Celexa  Type 2 diabetes, well controlled, A1c 7 (02/15/2019) FBG > 200, holding home meds will start low Lantus 5 U tonight, add scheduled Novolog 3 U with meals, appreciate diabetes coordinator  recommendations,closely monitor CBG, sliding scale as needed  BPH, stable continue Proscar  GERD, stable.  Continue PPI  Hyperlipidemia, stable continue simvastatin  Chronic right hip pain.  Continue supportive care with Tylenol, avoid NSAIDs given CAD   DVT prophylaxis: SCDs Code Status: Full code Family Communication: No family at bedside Disposition Plan: Continue milrinone drip to improve cardiac index,IV lasix, Levaquin, possible CABG next week, monitor Na   [facility] Direct contact: see [email or web address] (further directions at bottom of note if needed) 7PM-7AM contact night coverage as at bottom of note 04/19/2019, 7:51 AM  LOS: 7 days   Consultants   PCCM, cardiology, CT surgery, advanced heart failure  Procedures  TTE; 04/13/2019   1. The left ventricle has severely reduced systolic function, with an ejection fraction of 25-30%. The cavity size was normal. There is mildly increased left ventricular wall thickness. Left ventricular diastolic Doppler parameters are indeterminate. 2. Severe hypokinesis of the left ventricular, entire inferior wall and anteroseptal wall. 3. Left atrial size was mildly dilated. 4. Small pericardial effusion. 5. The pericardial effusion is posterior to the left ventricle. 6. The mitral valve is abnormal. Mild thickening of the mitral valve leaflet. There is mild to moderate mitral annular calcification present. 7. The aortic valve is tricuspid. Mild sclerosis of the aortic valve. No stenosis of the aortic valve. 8. The aorta is normal unless otherwise noted. 9. The inferior vena cava was dilated in size with <50% respiratory variability. 10. The interatrial septum was not assessed.    Antibiotics   Ceftriaxone 8/14-8/18  Azithromycin 8/14-8/18  Zosyn 8/14  Interval History/Subjective  Feels right flank pain much better today. No CP, SOB. Still coughing  Objective   Vitals:  Vitals:   04/19/19 0644 04/19/19 0744   BP:  (!) 147/76  Pulse:    Resp: (!) 28 (!) 25  Temp:  98.2 F (36.8 C)  SpO2:      Exam:  Awake Alert, Oriented X 3, No new F.N deficits, Normal affect [place].AT, Supple Neck, no peripheral edema Symmetrical Chest wall movement, Good air movement bilaterally, minimal crackles at bases RRR,No Gallops,Rubs or new Murmurs, +ve B.Sounds, Abd Soft, No tenderness,  , No rebound - guarding or rigidity. Bruising in right lower abdomen   I have personally reviewed the following:   Data Reviewed: Basic Metabolic Panel: Recent Labs  Lab 04/15/19 0409 04/16/19 0348 04/16/19 0954 04/16/19 0956 04/17/19 0159 04/18/19 0153 04/19/19 0219  NA 132* 131* 131* 130* 130* 131* 128*  K 3.9 4.0 4.3 4.4 4.2 4.1 4.1  CL 94* 91*  --   --  93* 92* 90*  CO2 26 28  --   --  26 28 27   GLUCOSE 157* 232*  --   --  228* 165* 241*  BUN 24* 23  --   --  26* 19 11  CREATININE 0.94 1.01  --   --  0.92 0.85 0.87  CALCIUM 9.3 9.6  --   --  9.1 9.3 9.0   Liver Function Tests: Recent Labs  Lab 04/15/19 0409 04/16/19 0348 04/17/19 0159 04/18/19 0153 04/19/19 0219  AST 69* 81* 72* 65* 54*  ALT 44 57* 55* 48* 45*  ALKPHOS 68 90 88 90 101  BILITOT 0.8 0.7 0.8 1.3* 1.9*  PROT 5.6* 6.3* 5.1* 5.1* 5.1*  ALBUMIN 2.4* 2.7* 2.5* 2.5* 2.6*   No results for input(s): LIPASE, AMYLASE in the last 168 hours. No results for input(s): AMMONIA in the last 168 hours. CBC: Recent Labs  Lab 04/12/19 1647  04/16/19  [id number]  04/16/19 0956 04/17/19 0159 04/18/19 0153 04/18/19 1004 04/19/19 0219  WBC 14.8*   < > 14.8*  --   --  18.5* 20.3* 21.2* 16.5*  NEUTROABS 12.1*  --   --   --   --   --   --   --   --   HGB 16.6   < > 12.9*   < > 10.5* 9.1* 7.3* 7.3* 8.8*  HCT 52.2*   < > 39.9   < > 31.0* 26.9* 22.4* 22.7* 26.3*  MCV 92.7   < > 90.7  --   --  88.8 91.8 91.2 88.3  PLT 141*   < > 275  --   --  265 315 343 262   < > = values in this interval not displayed.   Cardiac Enzymes: No results for input(s):  CKTOTAL, CKMB, CKMBINDEX, TROPONINI in the last 168 hours. BNP (last 3 results) Recent Labs    04/12/19 1647  BNP 359.3*    ProBNP (last 3 results) No results for input(s): PROBNP in the last 8760 hours.  CBG: Recent Labs  Lab 04/18/19 1102 04/18/19 1610 04/18/19 2155 04/19/19 0139 04/19/19 0630  GLUCAP 209* 179* 212* 211* 219*    Recent Results (from the past 240 hour(s))  SARS Coronavirus 2 [facility] order, Performed in [facility] lab) Nasopharyngeal Nasopharyngeal Swab     Status: None   Collection Time: 04/12/19  4:47 PM   Specimen: Nasopharyngeal Swab  Result Value Ref Range Status   SARS Coronavirus 2 NEGATIVE NEGATIVE Final    Comment: (NOTE) If result is NEGATIVE SARS-CoV-2 target nucleic acids are NOT DETECTED. The SARS-CoV-2 RNA is generally detectable in upper and lower  respiratory specimens during the acute phase of infection. The lowest  concentration of SARS-CoV-2 viral copies this assay can detect is 250  copies / mL. A negative result does not preclude SARS-CoV-2 infection  and should not be used as the sole basis for treatment or other  patient management decisions.  A negative result may occur with  improper specimen collection / handling, submission of specimen other  than nasopharyngeal swab, presence of viral mutation(s) within the  areas targeted by this assay, and inadequate number of viral copies  (<250 copies / mL). A negative result must be combined with clinical  observations, patient history, and epidemiological information. If result is POSITIVE SARS-CoV-2 target nucleic acids are DETECTED. The SARS-CoV-2 RNA is generally detectable in upper and lower  respiratory specimens dur ing the acute phase of infection.  Positive  results are indicative of active infection with SARS-CoV-2.  Clinical  correlation with patient history and other diagnostic information is  necessary to determine patient infection status.  Positive results do   not rule out bacterial infection or co-infection with other viruses. If result is PRESUMPTIVE POSTIVE SARS-CoV-2 nucleic acids MAY BE PRESENT.   A presumptive positive result was obtained on the submitted specimen  and confirmed on repeat testing.  While 2019 novel coronavirus  (SARS-CoV-2) nucleic acids may be present in the submitted sample  additional confirmatory testing may be necessary for epidemiological  and / or clinical management purposes  to differentiate between  SARS-CoV-2 and other Sarbecovirus currently known to infect humans.  If clinically indicated additional testing with an alternate test  methodology [phone]) is advised. The SARS-CoV-2 RNA is generally  detectable in upper and lower respiratory sp ecimens during the acute  phase of infection. The expected result is Negative. Fact Sheet  for Patients:  StrictlyIdeas.no Fact Sheet for Healthcare Providers: BankingDealers.co.za This test is not yet approved or cleared by the [place] FDA and has been authorized for detection and/or diagnosis of SARS-CoV-2 by FDA under an Emergency Use Authorization (EUA).  This EUA will remain in effect (meaning this test can be used) for the duration of the COVID-19 declaration under Section 564(b)(1) of the Act, 21 U.S.C. section 360bbb-3(b)(1), unless the authorization is terminated or revoked sooner. Performed at [facility], [place] [address], [place], [place] [address]   Culture, blood (Routine X 2) w Reflex to ID Panel     Status: None   Collection Time: 04/12/19  9:14 PM   Specimen: BLOOD RIGHT FOREARM  Result Value Ref Range Status   Specimen Description   Final    BLOOD RIGHT FOREARM Performed at [facility], [place] [address], [place], [place] [address]    Special Requests   Final    BOTTLES DRAWN AEROBIC AND ANAEROBIC Blood Culture adequate volume Performed at [place] [address] [place], [place] [address]    Culture   Final    NO GROWTH 5 DAYS Performed at [facility], [place] [address], [place], [place] [address]    Report Status 04/18/2019 FINAL  Final  Culture, blood (Routine X 2) w Reflex to ID Panel     Status: None   Collection Time: 04/12/19 11:52 PM   Specimen: BLOOD  Result Value Ref Range Status   Specimen Description   Final    BLOOD RIGHT ANTECUBITAL Performed at [place] [address] [place], [place] [address]    Special Requests   Final    BOTTLES DRAWN AEROBIC AND ANAEROBIC Blood Culture adequate volume Performed at [place] [address], [place], [place] [address]    Culture   Final    NO GROWTH 5 DAYS Performed at [facility], [place] [address] [place], [place] [address]    Report Status 04/18/2019 FINAL  Final  MRSA PCR Screening     Status: None   Collection Time: 04/13/19  6:21 PM   Specimen: Nasopharyngeal  Result Value Ref Range Status   MRSA by PCR NEGATIVE NEGATIVE Final    Comment:        The GeneXpert MRSA Assay (FDA approved for NASAL specimens only), is one component of a comprehensive MRSA colonization surveillance program. It is not intended to diagnose MRSA infection nor to guide or monitor treatment for MRSA infections. Performed at [facility], [place] [address] [place],  [address]      Studies: Ct Abdomen Pelvis Wo Contrast  Result Date: 04/18/2019 CLINICAL DATA:  Severe right hip pain.  Low hemoglobin.  On heparin. EXAM: CT CHEST, ABDOMEN AND PELVIS WITHOUT CONTRAST TECHNIQUE: Multidetector CT imaging of the chest, abdomen and pelvis was performed following the standard protocol without IV contrast. COMPARISON:  Pelvis radiograph dated 04/15/2019. Abdomen and pelvis CT dated 01/22/2016. FINDINGS: CT CHEST FINDINGS Cardiovascular: Dense coronary artery calcifications and probable stents. Right PICC tip in the superior vena  cava near the cavoatrial junction. Borderline enlarged heart. Mediastinum/Nodes: No enlarged mediastinal, hilar, or axillary lymph nodes. Thyroid gland, trachea, and esophagus demonstrate no significant findings. Lungs/Pleura: Small right pleural effusion. Dense right lower lobe airspace consolidation medially. Mild prominence of the interstitial markings. Musculoskeletal: Thoracic and lower cervical spine degenerative changes. CT ABDOMEN PELVIS FINDINGS Hepatobiliary: No focal liver abnormality  is seen. Status post cholecystectomy. No biliary dilatation. Pancreas: Unremarkable. No pancreatic ductal dilatation or surrounding inflammatory changes. Spleen: Normal in size without focal abnormality. Adrenals/Urinary Tract: Normal appearing adrenal glands, kidneys, ureters and urinary bladder. Right kidney is anteriorly displaced by a retroperitoneal hematoma. Stomach/Bowel: Multiple colonic diverticula. Normal appearing stomach, small bowel and appendix. Vascular/Lymphatic: Atheromatous arterial calcifications without aneurysm. Single mildly enlarged right perirectal lymph node with a short axis diameter of 9 mm on image number 118 series 3, previously 6 mm. Reproductive: Prostate is unremarkable. Other: Moderately large retroperitoneal hematoma on the right, displacing the right kidney anteriorly and extending inferiorly as far as the inferior pelvis, into the proximal portion of a right inguinal hernia containing fat this is also involving the right psoas muscle and iliopsoas muscle. Small bilateral inguinal hernias containing fat. Midline surgical scar. Bilateral subcutaneous edema. Musculoskeletal: Lumbar spine degenerative changes. IMPRESSION: 1. Moderately large right retroperitoneal hematoma, as described above. 2. Dense right lower lobe pneumonia with an associated small right pleural effusion. 3. Mild interstitial pulmonary edema or chronic interstitial lung disease. 4. Colonic diverticulosis. These results  will be called to the ordering clinician or representative by the Radiologist Assistant, and communication documented in the PACS or zVision Dashboard. Electronically Signed   By: Claudie Revering M.D.   On: 04/18/2019 12:09   Ct Chest Wo Contrast  Result Date: 04/18/2019 CLINICAL DATA:  Severe right hip pain.  Low hemoglobin.  On heparin. EXAM: CT CHEST, ABDOMEN AND PELVIS WITHOUT CONTRAST TECHNIQUE: Multidetector CT imaging of the chest, abdomen and pelvis was performed following the standard protocol without IV contrast. COMPARISON:  Pelvis radiograph dated 04/15/2019. Abdomen and pelvis CT dated 01/22/2016. FINDINGS: CT CHEST FINDINGS Cardiovascular: Dense coronary artery calcifications and probable stents. Right PICC tip in the superior vena cava near the cavoatrial junction. Borderline enlarged heart. Mediastinum/Nodes: No enlarged mediastinal, hilar, or axillary lymph nodes. Thyroid gland, trachea, and esophagus demonstrate no significant findings. Lungs/Pleura: Small right pleural effusion. Dense right lower lobe airspace consolidation medially. Mild prominence of the interstitial markings. Musculoskeletal: Thoracic and lower cervical spine degenerative changes. CT ABDOMEN PELVIS FINDINGS Hepatobiliary: No focal liver abnormality is seen. Status post cholecystectomy. No biliary dilatation. Pancreas: Unremarkable. No pancreatic ductal dilatation or surrounding inflammatory changes. Spleen: Normal in size without focal abnormality. Adrenals/Urinary Tract: Normal appearing adrenal glands, kidneys, ureters and urinary bladder. Right kidney is anteriorly displaced by a retroperitoneal hematoma. Stomach/Bowel: Multiple colonic diverticula. Normal appearing stomach, small bowel and appendix. Vascular/Lymphatic: Atheromatous arterial calcifications without aneurysm. Single mildly enlarged right perirectal lymph node with a short axis diameter of 9 mm on image number 118 series 3, previously 6 mm. Reproductive:  Prostate is unremarkable. Other: Moderately large retroperitoneal hematoma on the right, displacing the right kidney anteriorly and extending inferiorly as far as the inferior pelvis, into the proximal portion of a right inguinal hernia containing fat this is also involving the right psoas muscle and iliopsoas muscle. Small bilateral inguinal hernias containing fat. Midline surgical scar. Bilateral subcutaneous edema. Musculoskeletal: Lumbar spine degenerative changes. IMPRESSION: 1. Moderately large right retroperitoneal hematoma, as described above. 2. Dense right lower lobe pneumonia with an associated small right pleural effusion. 3. Mild interstitial pulmonary edema or chronic interstitial lung disease. 4. Colonic diverticulosis. These results will be called to the ordering clinician or representative by the Radiologist Assistant, and communication documented in the PACS or zVision Dashboard. Electronically Signed   By: Claudie Revering M.D.   On: 04/18/2019 12:09   Vas US Doppler Pre  Cabg  Result Date: 04/17/2019 PREOPERATIVE VASCULAR EVALUATION  Indications:  Pre-surgical evaluation. Risk Factors: Hypertension, hyperlipidemia, Diabetes. Performing Technologist: Maudry Mayhew RDMS, RVT, RDCS Supporting Technologist: Abram Sander RVS  Examination Guidelines: A complete evaluation includes B-mode imaging, spectral Doppler, color Doppler, and power Doppler as needed of all accessible portions of each vessel. Bilateral testing is considered an integral part of a complete examination. Limited examinations for reoccurring indications may be performed as noted.  Right Carotid Findings: +----------+--------+-------+--------+----------------------+------------------+             PSV cm/s EDV     Stenosis Describe               Comments                                 cm/s                                                        +----------+--------+-------+--------+----------------------+------------------+  CCA  Prox   133      20                                      intimal thickening  +----------+--------+-------+--------+----------------------+------------------+  CCA Distal 102      20                                                          +----------+--------+-------+--------+----------------------+------------------+  ICA Prox   104      16               smooth and                                                                       heterogenous                               +----------+--------+-------+--------+----------------------+------------------+  ICA Distal 88       21                                                          +----------+--------+-------+--------+----------------------+------------------+  ECA        122      17                                                          +----------+--------+-------+--------+----------------------+------------------+  Portions of this table do not appear on this page. +----------+--------+-------+----------------+------------+             PSV cm/s EDV cms Describe         Arm Pressure  +----------+--------+-------+----------------+------------+  Subclavian 133              Multiphasic, WNL               +----------+--------+-------+----------------+------------+ +---------+--------+--------+--------------+  Vertebral PSV cm/s EDV cm/s Not identified  +---------+--------+--------+--------------+ Left Carotid Findings: +----------+-------+-------+--------+---------------------------------+--------+             PSV     EDV     Stenosis Describe                          Comments              cm/s    cm/s                                                         +----------+-------+-------+--------+---------------------------------+--------+  CCA Prox   132     31                                                           +----------+-------+-------+--------+---------------------------------+--------+  CCA Distal 85      22                                                            +----------+-------+-------+--------+---------------------------------+--------+  ICA Prox   74      16               heterogenous, irregular and                                                      calcific                                    +----------+-------+-------+--------+---------------------------------+--------+  ICA Distal 88      32               smooth, heterogenous and calcific           +----------+-------+-------+--------+---------------------------------+--------+  ECA        138     17               smooth and heterogenous                     +----------+-------+-------+--------+---------------------------------+--------+ +----------+--------+--------+----------------+------------+  Subclavian PSV cm/s EDV cm/s Describe         Arm Pressure  +----------+--------+--------+----------------+------------+             210  Multiphasic, WNL 120           +----------+--------+--------+----------------+------------+ +---------+--------+--+--------+--+---------+  Vertebral PSV cm/s 54 EDV cm/s 18 Antegrade  +---------+--------+--+--------+--+---------+  ABI Findings: +--------+------------------+-----+---------+--------------+  Right    Rt Pressure (mmHg) Index Waveform  Comment         +--------+------------------+-----+---------+--------------+  Brachial                          triphasic restricted arm  +--------+------------------+-----+---------+--------------+  PTA      255                2.12  triphasic                 +--------+------------------+-----+---------+--------------+  DP       109                0.91  triphasic                 +--------+------------------+-----+---------+--------------+ +--------+------------------+-----+---------+-------+  Left     Lt Pressure (mmHg) Index Waveform  Comment  +--------+------------------+-----+---------+-------+  Brachial 120                      triphasic          +--------+------------------+-----+---------+-------+  PTA       222                1.85  triphasic          +--------+------------------+-----+---------+-------+  DP       129                1.08  triphasic          +--------+------------------+-----+---------+-------+ +-------+---------------+----------------+  ABI/TBI Today's ABI/TBI Previous ABI/TBI  +-------+---------------+----------------+  Right   2.12                              +-------+---------------+----------------+  Left    1.85                              +-------+---------------+----------------+  Right Doppler Findings: +--------+--------+-----+---------+--------------+  Site     Pressure Index Doppler   Comments        +--------+--------+-----+---------+--------------+  Brachial                triphasic restricted arm  +--------+--------+-----+---------+--------------+  Radial                  triphasic                 +--------+--------+-----+---------+--------------+  Ulnar                   triphasic                 +--------+--------+-----+---------+--------------+  Left Doppler Findings: +--------+--------+-----+---------+--------+  Site     Pressure Index Doppler   Comments  +--------+--------+-----+---------+--------+  Brachial 120            triphasic           +--------+--------+-----+---------+--------+  Radial                  triphasic           +--------+--------+-----+---------+--------+  Ulnar                   triphasic           +--------+--------+-----+---------+--------+  Summary: Right Carotid: Velocities in the right ICA are consistent with a 1-39% stenosis. Left Carotid: Velocities in the left ICA are consistent with a 1-39% stenosis. Vertebrals:  Left vertebral artery demonstrates antegrade flow. Right vertebral              artery was not visualized. Subclavians: Normal flow hemodynamics were seen in bilateral subclavian              arteries. Right ABI: Resting right ankle-brachial index indicates noncompressible right lower extremity arteries. Left ABI: Resting left ankle-brachial  index indicates noncompressible left lower extremity arteries. Right Upper Extremity: Doppler waveform obliterate with right radial compression. Doppler waveform obliterate with right ulnar compression. Left Upper Extremity: Doppler waveforms decrease 50% with left radial compression. Doppler waveforms decrease 50% with left ulnar compression.  Electronically signed by Curt Jews MD on 04/17/2019 at 12:55:38 PM.    Final     Scheduled Meds:  sodium chloride   Intravenous Once   aspirin  81 mg Oral Daily   Chlorhexidine Gluconate Cloth  6 each Topical Daily   citalopram  10 mg Oral Daily   digoxin  0.125 mg Oral Daily   finasteride  5 mg Oral Daily   insulin aspart  0-20 Units Subcutaneous TID WC   insulin aspart  0-5 Units Subcutaneous QHS   lidocaine  2 patch Transdermal Q24H   mouth rinse  15 mL Mouth Rinse BID   pantoprazole  40 mg Oral q morning - 10a   polyethylene glycol  17 g Oral Daily   potassium chloride  80 mEq Other To OR   rosuvastatin  40 mg Oral q1800   senna-docusate  1 tablet Oral BID   sodium chloride flush  10-40 mL Intracatheter Q12H   sodium chloride flush  3 mL Intravenous Q12H   spironolactone  12.5 mg Oral Daily   Continuous Infusions:  sodium chloride     milrinone 0.25 mcg/kg/min (04/19/19 0051)   milrinone      Principal Problem:   Non-ST elevation (NSTEMI) myocardial infarction [facility]) Active Problems:   Type 2 diabetes mellitus with hyperlipidemia ([place])   Coronary atherosclerosis   GERD   Community acquired pneumonia of right lower lobe of lung ([place])   Acute respiratory failure ([place])   AKI (acute kidney injury) ([place])   Acute systolic HF (heart failure) ([facility])   Retroperitoneal hematoma   Acute on chronic blood loss anemia      Waldron Session Tanor Glaspy  [facility]

## 2019-04-19 NOTE — Progress Notes (Addendum)
Advanced Heart Failure Rounding Note   Subjective:   Yesterday milrinone was increased to 0.25 mcg. CO-OX now increased to 64%.   Hgb was down to 7.3 . CT abd showed RP bleed. Heparin stopped and given 2UPRBCs.   Hgb trending up 7.3>8.8  Pleasantly confused. Denies SOB, CP, orthopnea or PND   Objective:   Weight Range:  Vital Signs:   Temp:  [98 F (36.7 C)-98.5 F (36.9 C)] 98.2 F (36.8 C) (08/21 0744) Pulse Rate:  [85-94] 91 (08/21 0744) Resp:  [19-32] 25 (08/21 0744) BP: (102-147)/(51-99) 147/76 (08/21 0744) SpO2:  [90 %-96 %] 93 % (08/21 0744) Weight:  [117.5 kg] 117.5 kg (08/21 0644) Last BM Date: 04/15/19  Weight change: Filed Weights   04/17/19 0500 04/18/19 0631 04/19/19 0644  Weight: 118.1 kg 119.7 kg 117.5 kg    Intake/Output:   Intake/Output Summary (Last 24 hours) at 04/19/2019 0828 Last data filed at 04/19/2019 0800 Gross per 24 hour  Intake 2407.5 ml  Output 2875 ml  Net -467.5 ml     Physical Exam: CVP 13  General:  No resp difficulty HEENT: normal Neck: supple. JVP 12-13.  Carotids 2+ bilat; no bruits. No lymphadenopathy or thryomegaly appreciated. Cor: PMI nondisplaced. Regular rate & rhythm. No rubs, gallops or murmurs. Lungs: clear Abdomen: soft, tender, distended. No hepatosplenomegaly. No bruits or masses. Good bowel sounds. R flank ecchymotic.  Extremities: no cyanosis, clubbing, rash, edema Neuro: alert & orientedx3, cranial nerves grossly intact. moves all 4 extremities w/o difficulty. Affect pleasant  Telemetry: NSR 80s    Labs: Basic Metabolic Panel: Recent Labs  Lab 04/15/19 0409 04/16/19 0348 04/16/19 0954 04/16/19 0956 04/17/19 0159 04/18/19 0153 04/19/19 0219  NA 132* 131* 131* 130* 130* 131* 128*  K 3.9 4.0 4.3 4.4 4.2 4.1 4.1  CL 94* 91*  --   --  93* 92* 90*  CO2 26 28  --   --  26 28 27   GLUCOSE 157* 232*  --   --  228* 165* 241*  BUN 24* 23  --   --  26* 19 11  CREATININE 0.94 1.01  --   --  0.92 0.85  0.87  CALCIUM 9.3 9.6  --   --  9.1 9.3 9.0    Liver Function Tests: Recent Labs  Lab 04/15/19 0409 04/16/19 0348 04/17/19 0159 04/18/19 0153 04/19/19 0219  AST 69* 81* 72* 65* 54*  ALT 44 57* 55* 48* 45*  ALKPHOS 68 90 88 90 101  BILITOT 0.8 0.7 0.8 1.3* 1.9*  PROT 5.6* 6.3* 5.1* 5.1* 5.1*  ALBUMIN 2.4* 2.7* 2.5* 2.5* 2.6*   No results for input(s): LIPASE, AMYLASE in the last 168 hours. No results for input(s): AMMONIA in the last 168 hours.  CBC: Recent Labs  Lab 04/12/19 1647  04/16/19 0348  04/16/19 0956 04/17/19 0159 04/18/19 0153 04/18/19 1004 04/19/19 0219  WBC 14.8*   < > 14.8*  --   --  18.5* 20.3* 21.2* 16.5*  NEUTROABS 12.1*  --   --   --   --   --   --   --   --   HGB 16.6   < > 12.9*   < > 10.5* 9.1* 7.3* 7.3* 8.8*  HCT 52.2*   < > 39.9   < > 31.0* 26.9* 22.4* 22.7* 26.3*  MCV 92.7   < > 90.7  --   --  88.8 91.8 91.2 88.3  PLT 141*   < >  275  --   --  265 315 343 262   < > = values in this interval not displayed.    Cardiac Enzymes: No results for input(s): CKTOTAL, CKMB, CKMBINDEX, TROPONINI in the last 168 hours.  BNP: BNP (last 3 results) Recent Labs    04/12/19 1647  BNP 359.3*    ProBNP (last 3 results) No results for input(s): PROBNP in the last 8760 hours.    Other results:  Imaging: Ct Abdomen Pelvis Wo Contrast  Result Date: 04/18/2019 CLINICAL DATA:  Severe right hip pain.  Low hemoglobin.  On heparin. EXAM: CT CHEST, ABDOMEN AND PELVIS WITHOUT CONTRAST TECHNIQUE: Multidetector CT imaging of the chest, abdomen and pelvis was performed following the standard protocol without IV contrast. COMPARISON:  Pelvis radiograph dated 04/15/2019. Abdomen and pelvis CT dated 01/22/2016. FINDINGS: CT CHEST FINDINGS Cardiovascular: Dense coronary artery calcifications and probable stents. Right PICC tip in the superior vena cava near the cavoatrial junction. Borderline enlarged heart. Mediastinum/Nodes: No enlarged mediastinal, hilar, or axillary  lymph nodes. Thyroid gland, trachea, and esophagus demonstrate no significant findings. Lungs/Pleura: Small right pleural effusion. Dense right lower lobe airspace consolidation medially. Mild prominence of the interstitial markings. Musculoskeletal: Thoracic and lower cervical spine degenerative changes. CT ABDOMEN PELVIS FINDINGS Hepatobiliary: No focal liver abnormality is seen. Status post cholecystectomy. No biliary dilatation. Pancreas: Unremarkable. No pancreatic ductal dilatation or surrounding inflammatory changes. Spleen: Normal in size without focal abnormality. Adrenals/Urinary Tract: Normal appearing adrenal glands, kidneys, ureters and urinary bladder. Right kidney is anteriorly displaced by a retroperitoneal hematoma. Stomach/Bowel: Multiple colonic diverticula. Normal appearing stomach, small bowel and appendix. Vascular/Lymphatic: Atheromatous arterial calcifications without aneurysm. Single mildly enlarged right perirectal lymph node with a short axis diameter of 9 mm on image number 118 series 3, previously 6 mm. Reproductive: Prostate is unremarkable. Other: Moderately large retroperitoneal hematoma on the right, displacing the right kidney anteriorly and extending inferiorly as far as the inferior pelvis, into the proximal portion of a right inguinal hernia containing fat this is also involving the right psoas muscle and iliopsoas muscle. Small bilateral inguinal hernias containing fat. Midline surgical scar. Bilateral subcutaneous edema. Musculoskeletal: Lumbar spine degenerative changes. IMPRESSION: 1. Moderately large right retroperitoneal hematoma, as described above. 2. Dense right lower lobe pneumonia with an associated small right pleural effusion. 3. Mild interstitial pulmonary edema or chronic interstitial lung disease. 4. Colonic diverticulosis. These results will be called to the ordering clinician or representative by the Radiologist Assistant, and communication documented in the  PACS or zVision Dashboard. Electronically Signed   By: Claudie Revering M.D.   On: 04/18/2019 12:09   Ct Chest Wo Contrast  Result Date: 04/18/2019 CLINICAL DATA:  Severe right hip pain.  Low hemoglobin.  On heparin. EXAM: CT CHEST, ABDOMEN AND PELVIS WITHOUT CONTRAST TECHNIQUE: Multidetector CT imaging of the chest, abdomen and pelvis was performed following the standard protocol without IV contrast. COMPARISON:  Pelvis radiograph dated 04/15/2019. Abdomen and pelvis CT dated 01/22/2016. FINDINGS: CT CHEST FINDINGS Cardiovascular: Dense coronary artery calcifications and probable stents. Right PICC tip in the superior vena cava near the cavoatrial junction. Borderline enlarged heart. Mediastinum/Nodes: No enlarged mediastinal, hilar, or axillary lymph nodes. Thyroid gland, trachea, and esophagus demonstrate no significant findings. Lungs/Pleura: Small right pleural effusion. Dense right lower lobe airspace consolidation medially. Mild prominence of the interstitial markings. Musculoskeletal: Thoracic and lower cervical spine degenerative changes. CT ABDOMEN PELVIS FINDINGS Hepatobiliary: No focal liver abnormality is seen. Status post cholecystectomy. No biliary dilatation. Pancreas: Unremarkable.  No pancreatic ductal dilatation or surrounding inflammatory changes. Spleen: Normal in size without focal abnormality. Adrenals/Urinary Tract: Normal appearing adrenal glands, kidneys, ureters and urinary bladder. Right kidney is anteriorly displaced by a retroperitoneal hematoma. Stomach/Bowel: Multiple colonic diverticula. Normal appearing stomach, small bowel and appendix. Vascular/Lymphatic: Atheromatous arterial calcifications without aneurysm. Single mildly enlarged right perirectal lymph node with a short axis diameter of 9 mm on image number 118 series 3, previously 6 mm. Reproductive: Prostate is unremarkable. Other: Moderately large retroperitoneal hematoma on the right, displacing the right kidney anteriorly and  extending inferiorly as far as the inferior pelvis, into the proximal portion of a right inguinal hernia containing fat this is also involving the right psoas muscle and iliopsoas muscle. Small bilateral inguinal hernias containing fat. Midline surgical scar. Bilateral subcutaneous edema. Musculoskeletal: Lumbar spine degenerative changes. IMPRESSION: 1. Moderately large right retroperitoneal hematoma, as described above. 2. Dense right lower lobe pneumonia with an associated small right pleural effusion. 3. Mild interstitial pulmonary edema or chronic interstitial lung disease. 4. Colonic diverticulosis. These results will be called to the ordering clinician or representative by the Radiologist Assistant, and communication documented in the PACS or zVision Dashboard. Electronically Signed   By: Claudie Revering M.D.   On: 04/18/2019 12:09   Vas US Doppler Pre Cabg  Result Date: 04/17/2019 PREOPERATIVE VASCULAR EVALUATION  Indications:  Pre-surgical evaluation. Risk Factors: Hypertension, hyperlipidemia, Diabetes. Performing Technologist: Maudry Mayhew RDMS, RVT, RDCS Supporting Technologist: Abram Sander RVS  Examination Guidelines: A complete evaluation includes B-mode imaging, spectral Doppler, color Doppler, and power Doppler as needed of all accessible portions of each vessel. Bilateral testing is considered an integral part of a complete examination. Limited examinations for reoccurring indications may be performed as noted.  Right Carotid Findings: +----------+--------+-------+--------+----------------------+------------------+             PSV cm/s EDV     Stenosis Describe               Comments                                 cm/s                                                        +----------+--------+-------+--------+----------------------+------------------+  CCA Prox   133      20                                      intimal thickening   +----------+--------+-------+--------+----------------------+------------------+  CCA Distal 102      20                                                          +----------+--------+-------+--------+----------------------+------------------+  ICA Prox   104      16               smooth and  heterogenous                               +----------+--------+-------+--------+----------------------+------------------+  ICA Distal 88       21                                                          +----------+--------+-------+--------+----------------------+------------------+  ECA        122      17                                                          +----------+--------+-------+--------+----------------------+------------------+ Portions of this table do not appear on this page. +----------+--------+-------+----------------+------------+             PSV cm/s EDV cms Describe         Arm Pressure  +----------+--------+-------+----------------+------------+  Subclavian 133              Multiphasic, WNL               +----------+--------+-------+----------------+------------+ +---------+--------+--------+--------------+  Vertebral PSV cm/s EDV cm/s Not identified  +---------+--------+--------+--------------+ Left Carotid Findings: +----------+-------+-------+--------+---------------------------------+--------+             PSV     EDV     Stenosis Describe                          Comments              cm/s    cm/s                                                         +----------+-------+-------+--------+---------------------------------+--------+  CCA Prox   132     31                                                           +----------+-------+-------+--------+---------------------------------+--------+  CCA Distal 85      22                                                            +----------+-------+-------+--------+---------------------------------+--------+  ICA Prox   74      16               heterogenous, irregular and  calcific                                    +----------+-------+-------+--------+---------------------------------+--------+  ICA Distal 88      32               smooth, heterogenous and calcific           +----------+-------+-------+--------+---------------------------------+--------+  ECA        138     17               smooth and heterogenous                     +----------+-------+-------+--------+---------------------------------+--------+ +----------+--------+--------+----------------+------------+  Subclavian PSV cm/s EDV cm/s Describe         Arm Pressure  +----------+--------+--------+----------------+------------+             210               Multiphasic, WNL 120           +----------+--------+--------+----------------+------------+ +---------+--------+--+--------+--+---------+  Vertebral PSV cm/s 54 EDV cm/s 18 Antegrade  +---------+--------+--+--------+--+---------+  ABI Findings: +--------+------------------+-----+---------+--------------+  Right    Rt Pressure (mmHg) Index Waveform  Comment         +--------+------------------+-----+---------+--------------+  Brachial                          triphasic restricted arm  +--------+------------------+-----+---------+--------------+  PTA      255                2.12  triphasic                 +--------+------------------+-----+---------+--------------+  DP       109                0.91  triphasic                 +--------+------------------+-----+---------+--------------+ +--------+------------------+-----+---------+-------+  Left     Lt Pressure (mmHg) Index Waveform  Comment  +--------+------------------+-----+---------+-------+  Brachial 120                      triphasic          +--------+------------------+-----+---------+-------+  PTA      222                 1.85  triphasic          +--------+------------------+-----+---------+-------+  DP       129                1.08  triphasic          +--------+------------------+-----+---------+-------+ +-------+---------------+----------------+  ABI/TBI Today's ABI/TBI Previous ABI/TBI  +-------+---------------+----------------+  Right   2.12                              +-------+---------------+----------------+  Left    1.85                              +-------+---------------+----------------+  Right Doppler Findings: +--------+--------+-----+---------+--------------+  Site     Pressure Index Doppler   Comments        +--------+--------+-----+---------+--------------+  Brachial                triphasic restricted arm  +--------+--------+-----+---------+--------------+  Radial  triphasic                 +--------+--------+-----+---------+--------------+  Ulnar                   triphasic                 +--------+--------+-----+---------+--------------+  Left Doppler Findings: +--------+--------+-----+---------+--------+  Site     Pressure Index Doppler   Comments  +--------+--------+-----+---------+--------+  Brachial 120            triphasic           +--------+--------+-----+---------+--------+  Radial                  triphasic           +--------+--------+-----+---------+--------+  Ulnar                   triphasic           +--------+--------+-----+---------+--------+  Summary: Right Carotid: Velocities in the right ICA are consistent with a 1-39% stenosis. Left Carotid: Velocities in the left ICA are consistent with a 1-39% stenosis. Vertebrals:  Left vertebral artery demonstrates antegrade flow. Right vertebral              artery was not visualized. Subclavians: Normal flow hemodynamics were seen in bilateral subclavian              arteries. Right ABI: Resting right ankle-brachial index indicates noncompressible right lower extremity arteries. Left ABI: Resting left ankle-brachial index indicates  noncompressible left lower extremity arteries. Right Upper Extremity: Doppler waveform obliterate with right radial compression. Doppler waveform obliterate with right ulnar compression. Left Upper Extremity: Doppler waveforms decrease 50% with left radial compression. Doppler waveforms decrease 50% with left ulnar compression.  Electronically signed by Curt Jews MD on 04/17/2019 at 12:55:38 PM.    Final      Medications:     Scheduled Medications:  sodium chloride   Intravenous Once   aspirin  81 mg Oral Daily   Chlorhexidine Gluconate Cloth  6 each Topical Daily   citalopram  10 mg Oral Daily   digoxin  0.125 mg Oral Daily   finasteride  5 mg Oral Daily   insulin aspart  0-20 Units Subcutaneous TID WC   insulin aspart  0-5 Units Subcutaneous QHS   lidocaine  2 patch Transdermal Q24H   mouth rinse  15 mL Mouth Rinse BID   pantoprazole  40 mg Oral q morning - 10a   polyethylene glycol  17 g Oral Daily   potassium chloride  80 mEq Other To OR   rosuvastatin  40 mg Oral q1800   senna-docusate  1 tablet Oral BID   sodium chloride flush  10-40 mL Intracatheter Q12H   sodium chloride flush  3 mL Intravenous Q12H   spironolactone  12.5 mg Oral Daily    Infusions:  sodium chloride     milrinone 0.25 mcg/kg/min (04/19/19 0051)   milrinone      PRN Medications: sodium chloride, acetaminophen, bisacodyl, ipratropium-albuterol, ondansetron (ZOFRAN) IV, sodium chloride flush, sodium chloride flush, traMADol   Assessment/Plam:   1. Acute Hypoxic Respiratory Failure in the setting of PNA - Intubated on admit and later extubated 8/15.  - much improved. - Completing antibiotic course of azithromycin/ceftriaxone.  - Now on 2 liters oxygen with stable O2 sats  -Continue aggressive pulmonary toilet  - ambulation, flutter valve and IS   2. NSTEMI - HS Trop 136>150> 1963 - Cath 8/18  with severe 3V disease,ostial proximal and mid LAD, the mid RCA, and the mid and  distal circumflex.  - CT surgery consulted for possible CABG. On ASA + crestor. - Heparin stopped 8/20 with RP bleed  -No chest pain.   3. Acute Systolic HF, ICM - EF 0000000. RV OK  RHC with CI 1.6 CO 3.7.  - Volume status trending up. CVp 13. Given 80 mg IV lasix now.  - Continue milrinone 0.25 mcg. CO-OX 64%.  - - continue digoxin 0.25 mg - Increase spiro to 25 mg daily. - Hold carvedilol for now  4. Diabetes - HgBa1c 7.0  5. Arthritis pain Per Primary.     6. Anemia in the setting or RP bleed CT showed mod-large RP hematoma.  8/20 Given 2 UPRBRCs  Hgb trending u0p. 7.3>8.8 . - Add SCDs.   7. Hyponatremia.  Sodium 128. Restrict free water. Follow BMET daily.  8. Confusion  Watch closely.       Length of Stay: [place] [facility]  04/19/2019, 8:28 AM  Advanced Heart Failure Team Pager [phone] (M-F; 7a - 4p)  Please contact [facility] for night-coverage after hours (4p -7a ) and weekends on [email or web address]  Patient seen and examined with Darrick Grinder, NP. We discussed all aspects of the encounter. I agree with the assessment and plan as stated above.   Developed large RP bleed yesterday and required 2u RBCs. Co-ox also dropped c/w cardiogenic shock. Milrinone increased.  More stable this am but still tenuous. Hgb up to 8.8. Co-ox improved to 64% Remains a bit confused but not combative. R groin area still sore but stable. Volume status up.  Continue milrinone at 0.25. Start IV lasix. Add SCDs.   D/w Dr. Cyndia Bent. Possible CABG next week as he improves. Continue to treat his PNA.   Glori Bickers, MD  9:20 AM

## 2019-04-20 DIAGNOSIS — E871 Hypo-osmolality and hyponatremia: Secondary | ICD-10-CM | POA: Diagnosis present

## 2019-04-20 DIAGNOSIS — D62 Acute posthemorrhagic anemia: Secondary | ICD-10-CM

## 2019-04-20 LAB — CBC
HCT: 29.3 % — ABNORMAL LOW (ref 39.0–52.0)
Hemoglobin: 9.8 g/dL — ABNORMAL LOW (ref 13.0–17.0)
MCH: 29.5 pg (ref 26.0–34.0)
MCHC: 33.4 g/dL (ref 30.0–36.0)
MCV: 88.3 fL (ref 80.0–100.0)
Platelets: 272 10*3/uL (ref 150–400)
RBC: 3.32 MIL/uL — ABNORMAL LOW (ref 4.22–5.81)
RDW: 16.3 % — ABNORMAL HIGH (ref 11.5–15.5)
WBC: 14 10*3/uL — ABNORMAL HIGH (ref 4.0–10.5)
nRBC: 2.6 % — ABNORMAL HIGH (ref 0.0–0.2)

## 2019-04-20 LAB — GLUCOSE, CAPILLARY
Glucose-Capillary: 181 mg/dL — ABNORMAL HIGH (ref 70–99)
Glucose-Capillary: 242 mg/dL — ABNORMAL HIGH (ref 70–99)
Glucose-Capillary: 228 mg/dL — ABNORMAL HIGH (ref 70–99)
Glucose-Capillary: 200 mg/dL — ABNORMAL HIGH (ref 70–99)

## 2019-04-20 LAB — COMPREHENSIVE METABOLIC PANEL
ALT: 56 U/L — ABNORMAL HIGH (ref 0–44)
AST: 73 U/L — ABNORMAL HIGH (ref 15–41)
Albumin: 2.6 g/dL — ABNORMAL LOW (ref 3.5–5.0)
Alkaline Phosphatase: 129 U/L — ABNORMAL HIGH (ref 38–126)
Anion gap: 10 (ref 5–15)
BUN: 10 mg/dL (ref 8–23)
CO2: 28 mmol/L (ref 22–32)
Calcium: 9.5 mg/dL (ref 8.9–10.3)
Chloride: 91 mmol/L — ABNORMAL LOW (ref 98–111)
Creatinine, Ser: 0.94 mg/dL (ref 0.61–1.24)
GFR calc Af Amer: >60 mL/min (ref >60)
GFR calc non Af Amer: >60 mL/min (ref >60)
Glucose, Bld: 191 mg/dL — ABNORMAL HIGH (ref 70–99)
Potassium: 4.3 mmol/L (ref 3.5–5.1)
Sodium: 129 mmol/L — ABNORMAL LOW (ref 135–145)
Total Bilirubin: 1.8 mg/dL — ABNORMAL HIGH (ref 0.3–1.2)
Total Protein: 5.6 g/dL — ABNORMAL LOW (ref 6.5–8.1)

## 2019-04-20 LAB — COOXEMETRY PANEL
Carboxyhemoglobin: 2.1 % — ABNORMAL HIGH (ref 0.5–1.5)
Methemoglobin: 0.9 % (ref 0.0–1.5)
O2 Saturation: 62.9 %
Total hemoglobin: 11.7 g/dL — ABNORMAL LOW (ref 12.0–16.0)

## 2019-04-20 MED ORDER — FUROSEMIDE 40 MG PO TABS
40.0000 mg | ORAL_TABLET | Freq: Every day | ORAL | Status: DC
  Administered 2019-04-20 – 2019-04-22 (×3): 40 mg via ORAL
  Filled 2019-04-20 (×3): qty 1

## 2019-04-20 MED ORDER — SACUBITRIL-VALSARTAN 24-26 MG PO TABS
1.0000 | ORAL_TABLET | Freq: Two times a day (BID) | ORAL | Status: DC
  Administered 2019-04-20 – 2019-04-22 (×5): 1 via ORAL
  Filled 2019-04-20 (×6): qty 1

## 2019-04-20 NOTE — Plan of Care (Signed)

## 2019-04-20 NOTE — Progress Notes (Signed)
[facility]  PROGRESS NOTE  ABIOLA FADDIS [id number] DOB: 04-Dec-1945 DOA: 04/12/2019 PCP: [name], MD  Brief History    Cameron Daniel is a 73 y.o. year old male with medical history significant for CAD, HTN, DM, Colon polyps, IBS, HLD, GERD with esophageal stricture, Diverticulosis, Depression, Anxiety former smoker  who presented on 04/12/2019 to [facility] long hospital with increased shortness of breath, cough and chills and was found to have acute hypoxic respiratory failure secondary to right lower lobe pneumonia requiring intubation due to worsening hypoxia/respiratory distress on 8/14.  Extubated on 8/15 but due to significant respiratory distress needed BiPAP.  After extubation patient again decompensated requiring BiPAP due to worsening respiratory symptoms, prompting further evaluation by TTE which showed reduced ejection fraction with wall motion on right heart cath/left heart cath on 8/18 found to have severe three-vessel disease elevated PCWP, cardiac index of 1.6.  At that time advanced heart failure was consulted to optimize for CABG.  Course complicated by retroperitoneal hematoma found on 8/20 during evaluation of persistent right groin pain and acute 2 g drop in hemoglobin.  Patient received 2 units packed red blood cells on 8/28 hemoglobin has improved 8.8 from 7.3 in the last 24 hours.  A & P    Retroperitoneal hematoma, stable. Acute on chronic anemia, improving.  After 2 unit blood transfusion hemoglobin improved from 7.3-8.8, remains clinically stable.  Continue to closely monitor.  Currently off heparin drip.  CT abdomen showed moderate to large sized retroperitoneal hematoma.    Hypervolemic Acute on Chronic Hyponatremia, slowly improving. 128--129( baseline 130-135). Restricting free water. Volume status is improving with diuresis  Hallucinations,resolved.  Alert and oriented to person, place, time, context.  Was likely delirium related to hospital  stay as well as acute retroperitoneal hematoma.    Leukocytosis, improving.  Likely reactive leukocytosis in the setting of acute bleed mentioned above, now downtrending from 20-16 today, continue to monitor.  Patient remains afebrile, blood cultures if spikes fever, continue to closely monitor, no new medications likely contributing.   Acute hypoxic respiratory failure, multifactorial secondary to community-acquired pneumonia and CHF with reduced EF, Completed 5-day course of azithromycin and ceftriaxone.  CT chest on 8/20 shows persistent  right-sided lower lobe consolidation from admission ( do not expect resolution of radiographic findings for several weeks), cough is stable, currently on room air with normal oxygen saturation, complete additional 5 days of Levaquin to complete total 10 day course of antibiotics. continue goal-directed medical therapy for CHF addressed below. Encouraged IS/flutter valve.  NSTEMI Severe three-vessel disease/CAD.   high-sensitivity troponin trend 136--150--1963. Right/left heart cath on 8/18 shows low cardiac output as well as severe three-vessel disease.  Needs further optimization of cardiac index/output currently on milrinone as directed by advanced heart failure team. Hopeful for CABG next week as he progresses clinically, continue aspirin, no need to resume heparin gtt per cardiology, CTS is following  Acute CHF ischemic cardiomyopathy with reduced EF exacerbation.   EF of 25% on TTE with wall motion abnormalities, Per AHF will transition to oral lasix given improved volume status ( wgt down 5lbs, improved JVD), continue milrinone gtt. continue digoxin, holding Coreg while on milrinone, spironolactone, start entrest,o follow co-oximetry, monitor ins and outs, daily BMP,   AKI, resolved.  Suspect prerenal etiology related to previous infection, peak creatinine of 1.5 , creatinine now stable around 1, held home Benicar closely monitor while on  milrinone.  Depression, stable continue Celexa  Type 2 diabetes, well  controlled, A1c 7 (02/15/2019) FBG > 200, holding home meds will start low Lantus 5 U tonight, add scheduled Novolog 3 U with meals, appreciate diabetes coordinator recommendations,closely monitor CBG, sliding scale as needed  BPH, stable continue Proscar  GERD, stable.  Continue PPI  Hyperlipidemia, stable continue simvastatin  Chronic right hip pain.  Continue supportive care with Tylenol, avoid NSAIDs given CAD   DVT prophylaxis: SCDs, likely due subcut heparin in 24 hours per AHF Code Status: Full code Family Communication: No family at bedside Disposition Plan: Continue milrinone drip to improve cardiac index, transition to oral lasix, Levaquin, possible CABG next week as he improves clinically, monitor Na   [facility] Direct contact: see [email or web address] (further directions at bottom of note if needed) 7PM-7AM contact night coverage as at bottom of note 04/20/2019, 8:25 AM  LOS: 8 days   Consultants   PCCM, cardiology, CT surgery, advanced heart failure  Procedures  TTE; 04/13/2019   1. The left ventricle has severely reduced systolic function, with an ejection fraction of 25-30%. The cavity size was normal. There is mildly increased left ventricular wall thickness. Left ventricular diastolic Doppler parameters are indeterminate. 2. Severe hypokinesis of the left ventricular, entire inferior wall and anteroseptal wall. 3. Left atrial size was mildly dilated. 4. Small pericardial effusion. 5. The pericardial effusion is posterior to the left ventricle. 6. The mitral valve is abnormal. Mild thickening of the mitral valve leaflet. There is mild to moderate mitral annular calcification present. 7. The aortic valve is tricuspid. Mild sclerosis of the aortic valve. No stenosis of the aortic valve. 8. The aorta is normal unless otherwise noted. 9. The inferior vena cava was dilated in size with <50%  respiratory variability. 10. The interatrial septum was not assessed.    Antibiotics   Ceftriaxone 8/14-8/18  Azithromycin 8/14-8/18  Zosyn 8/14  Interval History/Subjective  Feeling better. Mild cough. No SOB.   Objective   Vitals:  Vitals:   04/20/19 0445 04/20/19 0800  BP:  134/63  Pulse: 92 97  Resp: (!) 25 (!) 21  Temp:  98 F (36.7 C)  SpO2: 94% 96%    Exam:  Awake Alert, Oriented X 3, No new F.N deficits, Normal affect [place].AT, Supple Neck, no appreciable JVD,  no peripheral edema Symmetrical Chest wall movement, Good air movement bilaterally RRR,No Gallops,Rubs or new Murmurs, +ve B.Sounds, Abd Soft, No tenderness,  , No rebound - guarding or rigidity. Bruising in right lower abdomen   I have personally reviewed the following:   Data Reviewed: Basic Metabolic Panel: Recent Labs  Lab 04/16/19 0348  04/16/19 0956 04/17/19 0159 04/18/19 0153 04/19/19 0219 04/20/19 0415  NA 131*   < > 130* 130* 131* 128* 129*  K 4.0   < > 4.4 4.2 4.1 4.1 4.3  CL 91*  --   --  93* 92* 90* 91*  CO2 28  --   --  26 28 27 28   GLUCOSE 232*  --   --  228* 165* 241* 191*  BUN 23  --   --  26* 19 11 10   CREATININE 1.01  --   --  0.92 0.85 0.87 0.94  CALCIUM 9.6  --   --  9.1 9.3 9.0 9.5   < > = values in this interval not displayed.   Liver Function Tests: Recent Labs  Lab 04/16/19 0348 04/17/19 0159 04/18/19 0153 04/19/19 0219 04/20/19 0415  AST 81* 72* 65* 54* 73*  ALT 57* 55* 48* 45*  56*  ALKPHOS 90 88 90 101 129*  BILITOT 0.7 0.8 1.3* 1.9* 1.8*  PROT 6.3* 5.1* 5.1* 5.1* 5.6*  ALBUMIN 2.7* 2.5* 2.5* 2.6* 2.6*   No results for input(s): LIPASE, AMYLASE in the last 168 hours. No results for input(s): AMMONIA in the last 168 hours. CBC: Recent Labs  Lab 04/17/19 0159 04/18/19 0153 04/18/19 1004 04/19/19 0219 04/20/19 0415  WBC 18.5* 20.3* 21.2* 16.5* 14.0*  HGB 9.1* 7.3* 7.3* 8.8* 9.8*  HCT 26.9* 22.4* 22.7* 26.3* 29.3*  MCV 88.8 91.8 91.2 88.3 88.3   PLT 265 315 343 262 272   Cardiac Enzymes: No results for input(s): CKTOTAL, CKMB, CKMBINDEX, TROPONINI in the last 168 hours. BNP (last 3 results) Recent Labs    04/12/19 1647  BNP 359.3*    ProBNP (last 3 results) No results for input(s): PROBNP in the last 8760 hours.  CBG: Recent Labs  Lab 04/19/19 0630 04/19/19 1115 04/19/19 1632 04/19/19 2115 04/20/19 0635  GLUCAP 219* 256* 192* 171* 181*    Recent Results (from the past 240 hour(s))  SARS Coronavirus 2 [facility] order, Performed in [facility] hospital lab) Nasopharyngeal Nasopharyngeal Swab     Status: None   Collection Time: 04/12/19  4:47 PM   Specimen: Nasopharyngeal Swab  Result Value Ref Range Status   SARS Coronavirus 2 NEGATIVE NEGATIVE Final    Comment: (NOTE) If result is NEGATIVE SARS-CoV-2 target nucleic acids are NOT DETECTED. The SARS-CoV-2 RNA is generally detectable in upper and lower  respiratory specimens during the acute phase of infection. The lowest  concentration of SARS-CoV-2 viral copies this assay can detect is 250  copies / mL. A negative result does not preclude SARS-CoV-2 infection  and should not be used as the sole basis for treatment or other  patient management decisions.  A negative result may occur with  improper specimen collection / handling, submission of specimen other  than nasopharyngeal swab, presence of viral mutation(s) within the  areas targeted by this assay, and inadequate number of viral copies  (<250 copies / mL). A negative result must be combined with clinical  observations, patient history, and epidemiological information. If result is POSITIVE SARS-CoV-2 target nucleic acids are DETECTED. The SARS-CoV-2 RNA is generally detectable in upper and lower  respiratory specimens dur ing the acute phase of infection.  Positive  results are indicative of active infection with SARS-CoV-2.  Clinical  correlation with patient history and other diagnostic information  is  necessary to determine patient infection status.  Positive results do  not rule out bacterial infection or co-infection with other viruses. If result is PRESUMPTIVE POSTIVE SARS-CoV-2 nucleic acids MAY BE PRESENT.   A presumptive positive result was obtained on the submitted specimen  and confirmed on repeat testing.  While 2019 novel coronavirus  (SARS-CoV-2) nucleic acids may be present in the submitted sample  additional confirmatory testing may be necessary for epidemiological  and / or clinical management purposes  to differentiate between  SARS-CoV-2 and other Sarbecovirus currently known to infect humans.  If clinically indicated additional testing with an alternate test  methodology [phone]) is advised. The SARS-CoV-2 RNA is generally  detectable in upper and lower respiratory sp ecimens during the acute  phase of infection. The expected result is Negative. Fact Sheet for Patients:  StrictlyIdeas.no Fact Sheet for Healthcare Providers: BankingDealers.co.za This test is not yet approved or cleared by the [place] FDA and has been authorized for detection and/or diagnosis of SARS-CoV-2 by FDA under  an Emergency Use Authorization (EUA).  This EUA will remain in effect (meaning this test can be used) for the duration of the COVID-19 declaration under Section 564(b)(1) of the Act, 21 U.S.C. section 360bbb-3(b)(1), unless the authorization is terminated or revoked sooner. Performed at [facility], [place] [address], [place], [place] [address]   Culture, blood (Routine X 2) w Reflex to ID Panel     Status: None   Collection Time: 04/12/19  9:14 PM   Specimen: BLOOD RIGHT FOREARM  Result Value Ref Range Status   Specimen Description   Final    BLOOD RIGHT FOREARM Performed at [facility], [place] [address], [place], [place] [address]    Special Requests   Final    BOTTLES DRAWN AEROBIC AND ANAEROBIC  Blood Culture adequate volume Performed at [place] [address] [place], [place] [address]    Culture   Final    NO GROWTH 5 DAYS Performed at [facility], [place] [address], [place], [place] [address]    Report Status 04/18/2019 FINAL  Final  Culture, blood (Routine X 2) w Reflex to ID Panel     Status: None   Collection Time: 04/12/19 11:52 PM   Specimen: BLOOD  Result Value Ref Range Status   Specimen Description   Final    BLOOD RIGHT ANTECUBITAL Performed at [place] [address], [place], [place] [address]    Special Requests   Final    BOTTLES DRAWN AEROBIC AND ANAEROBIC Blood Culture adequate volume Performed at [place] [address] [place], [place] [address]    Culture   Final    NO GROWTH 5 DAYS Performed at [facility], [place] [address], [place], [place] [address]    Report Status 04/18/2019 FINAL  Final  MRSA PCR Screening     Status: None   Collection Time: 04/13/19  6:21 PM   Specimen: Nasopharyngeal  Result Value Ref Range Status   MRSA by PCR NEGATIVE NEGATIVE Final    Comment:        The GeneXpert MRSA Assay (FDA approved for NASAL specimens only), is one component of a comprehensive MRSA colonization surveillance program. It is not intended to diagnose MRSA infection nor to guide or monitor treatment for MRSA infections. Performed at [facility], [place] [address], [place], [place] [address]      Studies: Ct Abdomen Pelvis Wo Contrast  Result Date: 04/18/2019 CLINICAL DATA:  Severe right hip pain.  Low hemoglobin.  On heparin. EXAM: CT CHEST, ABDOMEN AND PELVIS WITHOUT CONTRAST TECHNIQUE: Multidetector CT imaging of the chest, abdomen and pelvis was performed following the standard protocol without IV contrast. COMPARISON:  Pelvis radiograph dated 04/15/2019. Abdomen and pelvis CT dated 01/22/2016. FINDINGS: CT CHEST FINDINGS Cardiovascular: Dense coronary  artery calcifications and probable stents. Right PICC tip in the superior vena cava near the cavoatrial junction. Borderline enlarged heart. Mediastinum/Nodes: No enlarged mediastinal, hilar, or axillary lymph nodes. Thyroid gland, trachea, and esophagus demonstrate no significant findings. Lungs/Pleura: Small right pleural effusion. Dense right lower lobe airspace consolidation medially. Mild prominence of the interstitial markings. Musculoskeletal: Thoracic and lower cervical spine degenerative changes. CT ABDOMEN PELVIS FINDINGS Hepatobiliary: No focal liver abnormality is seen. Status post cholecystectomy. No biliary dilatation. Pancreas: Unremarkable. No pancreatic ductal dilatation or surrounding inflammatory changes. Spleen: Normal in size without focal abnormality. Adrenals/Urinary Tract: Normal appearing adrenal glands, kidneys, ureters and urinary bladder.  Right kidney is anteriorly displaced by a retroperitoneal hematoma. Stomach/Bowel: Multiple colonic diverticula. Normal appearing stomach, small bowel and appendix. Vascular/Lymphatic: Atheromatous arterial calcifications without aneurysm. Single mildly enlarged right perirectal lymph node with a short axis diameter of 9 mm on image number 118 series 3, previously 6 mm. Reproductive: Prostate is unremarkable. Other: Moderately large retroperitoneal hematoma on the right, displacing the right kidney anteriorly and extending inferiorly as far as the inferior pelvis, into the proximal portion of a right inguinal hernia containing fat this is also involving the right psoas muscle and iliopsoas muscle. Small bilateral inguinal hernias containing fat. Midline surgical scar. Bilateral subcutaneous edema. Musculoskeletal: Lumbar spine degenerative changes. IMPRESSION: 1. Moderately large right retroperitoneal hematoma, as described above. 2. Dense right lower lobe pneumonia with an associated small right pleural effusion. 3. Mild interstitial pulmonary edema or  chronic interstitial lung disease. 4. Colonic diverticulosis. These results will be called to the ordering clinician or representative by the Radiologist Assistant, and communication documented in the PACS or zVision Dashboard. Electronically Signed   By: Claudie Revering M.D.   On: 04/18/2019 12:09   Ct Chest Wo Contrast  Result Date: 04/18/2019 CLINICAL DATA:  Severe right hip pain.  Low hemoglobin.  On heparin. EXAM: CT CHEST, ABDOMEN AND PELVIS WITHOUT CONTRAST TECHNIQUE: Multidetector CT imaging of the chest, abdomen and pelvis was performed following the standard protocol without IV contrast. COMPARISON:  Pelvis radiograph dated 04/15/2019. Abdomen and pelvis CT dated 01/22/2016. FINDINGS: CT CHEST FINDINGS Cardiovascular: Dense coronary artery calcifications and probable stents. Right PICC tip in the superior vena cava near the cavoatrial junction. Borderline enlarged heart. Mediastinum/Nodes: No enlarged mediastinal, hilar, or axillary lymph nodes. Thyroid gland, trachea, and esophagus demonstrate no significant findings. Lungs/Pleura: Small right pleural effusion. Dense right lower lobe airspace consolidation medially. Mild prominence of the interstitial markings. Musculoskeletal: Thoracic and lower cervical spine degenerative changes. CT ABDOMEN PELVIS FINDINGS Hepatobiliary: No focal liver abnormality is seen. Status post cholecystectomy. No biliary dilatation. Pancreas: Unremarkable. No pancreatic ductal dilatation or surrounding inflammatory changes. Spleen: Normal in size without focal abnormality. Adrenals/Urinary Tract: Normal appearing adrenal glands, kidneys, ureters and urinary bladder. Right kidney is anteriorly displaced by a retroperitoneal hematoma. Stomach/Bowel: Multiple colonic diverticula. Normal appearing stomach, small bowel and appendix. Vascular/Lymphatic: Atheromatous arterial calcifications without aneurysm. Single mildly enlarged right perirectal lymph node with a short axis diameter  of 9 mm on image number 118 series 3, previously 6 mm. Reproductive: Prostate is unremarkable. Other: Moderately large retroperitoneal hematoma on the right, displacing the right kidney anteriorly and extending inferiorly as far as the inferior pelvis, into the proximal portion of a right inguinal hernia containing fat this is also involving the right psoas muscle and iliopsoas muscle. Small bilateral inguinal hernias containing fat. Midline surgical scar. Bilateral subcutaneous edema. Musculoskeletal: Lumbar spine degenerative changes. IMPRESSION: 1. Moderately large right retroperitoneal hematoma, as described above. 2. Dense right lower lobe pneumonia with an associated small right pleural effusion. 3. Mild interstitial pulmonary edema or chronic interstitial lung disease. 4. Colonic diverticulosis. These results will be called to the ordering clinician or representative by the Radiologist Assistant, and communication documented in the PACS or zVision Dashboard. Electronically Signed   By: Claudie Revering M.D.   On: 04/18/2019 12:09    Scheduled Meds:  sodium chloride   Intravenous Once   aspirin  81 mg Oral Daily   Chlorhexidine Gluconate Cloth  6 each Topical Daily   citalopram  10 mg Oral Daily   digoxin  0.125 mg Oral Daily   finasteride  5 mg Oral Daily   insulin aspart  0-20 Units Subcutaneous TID WC   insulin aspart  0-5 Units Subcutaneous QHS   insulin aspart  3 Units Subcutaneous TID WC   insulin glargine  5 Units Subcutaneous Daily   levofloxacin  750 mg Oral Daily   lidocaine  2 patch Transdermal Q24H   mouth rinse  15 mL Mouth Rinse BID   pantoprazole  40 mg Oral q morning - 10a   polyethylene glycol  17 g Oral Daily   rosuvastatin  40 mg Oral q1800   senna-docusate  1 tablet Oral BID   sodium chloride flush  10-40 mL Intracatheter Q12H   sodium chloride flush  3 mL Intravenous Q12H   spironolactone  25 mg Oral Daily   Continuous Infusions:  sodium chloride      milrinone 0.25 mcg/kg/min (04/20/19 0125)    Principal Problem:   Non-ST elevation (NSTEMI) myocardial infarction [facility]) Active Problems:   Type 2 diabetes mellitus with hyperlipidemia ([place])   Coronary atherosclerosis   GERD   Community acquired pneumonia of right lower lobe of lung ([place])   Acute respiratory failure ([place])   AKI (acute kidney injury) ([place])   Acute systolic HF (heart failure) ([facility])   Retroperitoneal hematoma   Acute on chronic blood loss anemia      Waldron Session Wednesday Ericsson  [facility]

## 2019-04-20 NOTE — Progress Notes (Signed)
Advanced Heart Failure Rounding Note   Subjective:    On milrinone at 0.25. CO-OX stable at 63%. CVP 9-10  Sitting in chair this am. Says he is starting to feel better. Hip and back pain improved. No CP, SOB, orthopnea or PND. Now of heparin.  Hgb stable/improved at 9.8. Weight down 5 pounds with IV lasix after RBCs transfusion. Confusion resolved.   Objective:   Weight Range:  Vital Signs:   Temp:  [98 F (36.7 C)-98.5 F (36.9 C)] 98 F (36.7 C) (08/22 0800) Pulse Rate:  [92-103] 97 (08/22 0800) Resp:  [17-30] 21 (08/22 0800) BP: (134-156)/(63-73) 134/63 (08/22 0800) SpO2:  [90 %-98 %] 96 % (08/22 0800) Weight:  [115.6 kg] 115.6 kg (08/22 0500) Last BM Date: 04/19/19  Weight change: Filed Weights   04/18/19 0631 04/19/19 0644 04/20/19 0500  Weight: 119.7 kg 117.5 kg 115.6 kg    Intake/Output:   Intake/Output Summary (Last 24 hours) at 04/20/2019 0917 Last data filed at 04/20/2019 0500 Gross per 24 hour  Intake 562.71 ml  Output 4725 ml  Net -4162.29 ml     Physical Exam: CVP 9-10 General:  Sitting up in chair. No resp difficulty HEENT: normal Neck: supple. JVP 9-10. Carotids 2+ bilat; no bruits. No lymphadenopathy or thryomegaly appreciated. Cor: PMI nondisplaced. Regular rate & rhythm. No rubs, gallops or murmurs. Lungs: clear Abdomen: soft, nontender, nondistended. No hepatosplenomegaly. No bruits or masses. Good bowel sounds. R flank ecchymosis Extremities: no cyanosis, clubbing, rash, edema  Neuro: alert & orientedx3, cranial nerves grossly intact. moves all 4 extremities w/o difficulty. Affect pleasant   Telemetry: NSR 90s    Labs: Basic Metabolic Panel: Recent Labs  Lab 04/16/19 0348  04/16/19 0956 04/17/19 0159 04/18/19 0153 04/19/19 0219 04/20/19 0415  NA 131*   < > 130* 130* 131* 128* 129*  K 4.0   < > 4.4 4.2 4.1 4.1 4.3  CL 91*  --   --  93* 92* 90* 91*  CO2 28  --   --  26 28 27 28   GLUCOSE 232*  --   --  228* 165* 241* 191*    BUN 23  --   --  26* 19 11 10   CREATININE 1.01  --   --  0.92 0.85 0.87 0.94  CALCIUM 9.6  --   --  9.1 9.3 9.0 9.5   < > = values in this interval not displayed.    Liver Function Tests: Recent Labs  Lab 04/16/19 0348 04/17/19 0159 04/18/19 0153 04/19/19 0219 04/20/19 0415  AST 81* 72* 65* 54* 73*  ALT 57* 55* 48* 45* 56*  ALKPHOS 90 88 90 101 129*  BILITOT 0.7 0.8 1.3* 1.9* 1.8*  PROT 6.3* 5.1* 5.1* 5.1* 5.6*  ALBUMIN 2.7* 2.5* 2.5* 2.6* 2.6*   No results for input(s): LIPASE, AMYLASE in the last 168 hours. No results for input(s): AMMONIA in the last 168 hours.  CBC: Recent Labs  Lab 04/17/19 0159 04/18/19 0153 04/18/19 1004 04/19/19 0219 04/20/19 0415  WBC 18.5* 20.3* 21.2* 16.5* 14.0*  HGB 9.1* 7.3* 7.3* 8.8* 9.8*  HCT 26.9* 22.4* 22.7* 26.3* 29.3*  MCV 88.8 91.8 91.2 88.3 88.3  PLT 265 315 343 262 272    Cardiac Enzymes: No results for input(s): CKTOTAL, CKMB, CKMBINDEX, TROPONINI in the last 168 hours.  BNP: BNP (last 3 results) Recent Labs    04/12/19 1647  BNP 359.3*    ProBNP (last 3 results) No results for  input(s): PROBNP in the last 8760 hours.    Other results:  Imaging: Ct Abdomen Pelvis Wo Contrast  Result Date: 04/18/2019 CLINICAL DATA:  Severe right hip pain.  Low hemoglobin.  On heparin. EXAM: CT CHEST, ABDOMEN AND PELVIS WITHOUT CONTRAST TECHNIQUE: Multidetector CT imaging of the chest, abdomen and pelvis was performed following the standard protocol without IV contrast. COMPARISON:  Pelvis radiograph dated 04/15/2019. Abdomen and pelvis CT dated 01/22/2016. FINDINGS: CT CHEST FINDINGS Cardiovascular: Dense coronary artery calcifications and probable stents. Right PICC tip in the superior vena cava near the cavoatrial junction. Borderline enlarged heart. Mediastinum/Nodes: No enlarged mediastinal, hilar, or axillary lymph nodes. Thyroid gland, trachea, and esophagus demonstrate no significant findings. Lungs/Pleura: Small right pleural  effusion. Dense right lower lobe airspace consolidation medially. Mild prominence of the interstitial markings. Musculoskeletal: Thoracic and lower cervical spine degenerative changes. CT ABDOMEN PELVIS FINDINGS Hepatobiliary: No focal liver abnormality is seen. Status post cholecystectomy. No biliary dilatation. Pancreas: Unremarkable. No pancreatic ductal dilatation or surrounding inflammatory changes. Spleen: Normal in size without focal abnormality. Adrenals/Urinary Tract: Normal appearing adrenal glands, kidneys, ureters and urinary bladder. Right kidney is anteriorly displaced by a retroperitoneal hematoma. Stomach/Bowel: Multiple colonic diverticula. Normal appearing stomach, small bowel and appendix. Vascular/Lymphatic: Atheromatous arterial calcifications without aneurysm. Single mildly enlarged right perirectal lymph node with a short axis diameter of 9 mm on image number 118 series 3, previously 6 mm. Reproductive: Prostate is unremarkable. Other: Moderately large retroperitoneal hematoma on the right, displacing the right kidney anteriorly and extending inferiorly as far as the inferior pelvis, into the proximal portion of a right inguinal hernia containing fat this is also involving the right psoas muscle and iliopsoas muscle. Small bilateral inguinal hernias containing fat. Midline surgical scar. Bilateral subcutaneous edema. Musculoskeletal: Lumbar spine degenerative changes. IMPRESSION: 1. Moderately large right retroperitoneal hematoma, as described above. 2. Dense right lower lobe pneumonia with an associated small right pleural effusion. 3. Mild interstitial pulmonary edema or chronic interstitial lung disease. 4. Colonic diverticulosis. These results will be called to the ordering clinician or representative by the Radiologist Assistant, and communication documented in the PACS or zVision Dashboard. Electronically Signed   By: Claudie Revering M.D.   On: 04/18/2019 12:09   Ct Chest Wo  Contrast  Result Date: 04/18/2019 CLINICAL DATA:  Severe right hip pain.  Low hemoglobin.  On heparin. EXAM: CT CHEST, ABDOMEN AND PELVIS WITHOUT CONTRAST TECHNIQUE: Multidetector CT imaging of the chest, abdomen and pelvis was performed following the standard protocol without IV contrast. COMPARISON:  Pelvis radiograph dated 04/15/2019. Abdomen and pelvis CT dated 01/22/2016. FINDINGS: CT CHEST FINDINGS Cardiovascular: Dense coronary artery calcifications and probable stents. Right PICC tip in the superior vena cava near the cavoatrial junction. Borderline enlarged heart. Mediastinum/Nodes: No enlarged mediastinal, hilar, or axillary lymph nodes. Thyroid gland, trachea, and esophagus demonstrate no significant findings. Lungs/Pleura: Small right pleural effusion. Dense right lower lobe airspace consolidation medially. Mild prominence of the interstitial markings. Musculoskeletal: Thoracic and lower cervical spine degenerative changes. CT ABDOMEN PELVIS FINDINGS Hepatobiliary: No focal liver abnormality is seen. Status post cholecystectomy. No biliary dilatation. Pancreas: Unremarkable. No pancreatic ductal dilatation or surrounding inflammatory changes. Spleen: Normal in size without focal abnormality. Adrenals/Urinary Tract: Normal appearing adrenal glands, kidneys, ureters and urinary bladder. Right kidney is anteriorly displaced by a retroperitoneal hematoma. Stomach/Bowel: Multiple colonic diverticula. Normal appearing stomach, small bowel and appendix. Vascular/Lymphatic: Atheromatous arterial calcifications without aneurysm. Single mildly enlarged right perirectal lymph node with a short axis diameter of 9  mm on image number 118 series 3, previously 6 mm. Reproductive: Prostate is unremarkable. Other: Moderately large retroperitoneal hematoma on the right, displacing the right kidney anteriorly and extending inferiorly as far as the inferior pelvis, into the proximal portion of a right inguinal hernia  containing fat this is also involving the right psoas muscle and iliopsoas muscle. Small bilateral inguinal hernias containing fat. Midline surgical scar. Bilateral subcutaneous edema. Musculoskeletal: Lumbar spine degenerative changes. IMPRESSION: 1. Moderately large right retroperitoneal hematoma, as described above. 2. Dense right lower lobe pneumonia with an associated small right pleural effusion. 3. Mild interstitial pulmonary edema or chronic interstitial lung disease. 4. Colonic diverticulosis. These results will be called to the ordering clinician or representative by the Radiologist Assistant, and communication documented in the PACS or zVision Dashboard. Electronically Signed   By: Claudie Revering M.D.   On: 04/18/2019 12:09     Medications:     Scheduled Medications:  sodium chloride   Intravenous Once   aspirin  81 mg Oral Daily   Chlorhexidine Gluconate Cloth  6 each Topical Daily   citalopram  10 mg Oral Daily   digoxin  0.125 mg Oral Daily   finasteride  5 mg Oral Daily   insulin aspart  0-20 Units Subcutaneous TID WC   insulin aspart  0-5 Units Subcutaneous QHS   insulin aspart  3 Units Subcutaneous TID WC   insulin glargine  5 Units Subcutaneous Daily   levofloxacin  750 mg Oral Daily   lidocaine  2 patch Transdermal Q24H   mouth rinse  15 mL Mouth Rinse BID   pantoprazole  40 mg Oral q morning - 10a   polyethylene glycol  17 g Oral Daily   rosuvastatin  40 mg Oral q1800   senna-docusate  1 tablet Oral BID   sodium chloride flush  10-40 mL Intracatheter Q12H   sodium chloride flush  3 mL Intravenous Q12H   spironolactone  25 mg Oral Daily    Infusions:  sodium chloride     milrinone 0.25 mcg/kg/min (04/20/19 0125)    PRN Medications: sodium chloride, acetaminophen, bisacodyl, ipratropium-albuterol, ondansetron (ZOFRAN) IV, sodium chloride flush, sodium chloride flush, traMADol   Assessment/Plam:   1. Acute Hypoxic Respiratory Failure in  the setting of PNA - Intubated on admit and later extubated 8/15.  - much improved. - Treated with azithromycin/ceftriaxone -> CT with persistent dense airspace disease. Today day 2/4 for Levaquin.  - Now on 2 liters oxygen with stable O2 sats  - Continue aggressive pulmonary toilet  - ambulation, flutter valve and IS   2. 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  - CT surgery consulted for possible CABG. On ASA + crestor. - Heparin stopped 8/20 with RP bleed. No further angina. No need to restart heparin  - Hopefully should be ready for CABG by midweek   3. Acute Systolic HF, ICM - EF 0000000. RV OK  RHC with CI 1.6 CO 3.7.  - Volume status improved. CVP 8-9  - Start oral lasix - Continue milrinone 0.25 mcg. CO-OX 62%.  - Continue digoxin 0.25 mg - Continue spiro to 25 mg daily. - Hold carvedilol for now with need for intropes - Start low-dose Entresto  4. Diabetes - HgBa1c 7.0  5. Arthritis pain Per Primary.     6. Anemia in the setting or RP bleed -CT showed mod-large RP  hematoma at [place] site - 8/20 Given 2 UPRBRCs  Hgb trending u0p. 7.3>8.8 -> 9.8 . - Add SCDs.  - Start SQ heparin at DVT dose tomorrow  7. Hyponatremia.  -Sodium 129. Restrict free water. Follow BMET daily.  8. Acute delirium - resolved       Length of Stay: 8   Glori Bickers MD 04/20/2019, 9:17 AM  Advanced Heart Failure Team Pager [phone] (M-F; 7a - 4p)  Please contact [facility] for night-coverage after hours (4p -7a ) and weekends on [email or web address]

## 2019-04-20 NOTE — Progress Notes (Signed)
Patient ID: Cameron Daniel, male   DOB: Dec 09, 1945, 73 y.o.   MRN: [id number] TCTS DAILY ICU PROGRESS NOTE                   [address]            [place] [address]          [phone]   4 Days Post-Op Procedure(s) (LRB): RIGHT/LEFT HEART CATH AND CORONARY ANGIOGRAPHY (N/A)  Total Length of Stay:  LOS: 8 days   Subjective: Patient up in chair, awake and talkative, very mildly confused  Objective: Vital signs in last 24 hours: Temp:  [98 F (36.7 C)-98.5 F (36.9 C)] 98 F (36.7 C) (08/22 1051) Pulse Rate:  [92-103] 97 (08/22 0800) Cardiac Rhythm: Normal sinus rhythm;Bundle branch block (08/22 0800) Resp:  [17-30] 21 (08/22 0800) BP: (134-156)/(63-73) 134/63 (08/22 0800) SpO2:  [90 %-98 %] 96 % (08/22 0800) Weight:  [115.6 kg] 115.6 kg (08/22 0500)  Filed Weights   04/18/19 0631 04/19/19 0644 04/20/19 0500  Weight: 119.7 kg 117.5 kg 115.6 kg    Weight change: -1.9 kg   Hemodynamic parameters for last 24 hours: CVP:  [6 mmHg-10 mmHg] 7 mmHg  Intake/Output from previous day: 08/21 0701 - 08/22 0700 In: 562.7 [P.O.:360; I.V.:202.7] Out: 5400 [Urine:5400]  Intake/Output this shift: Total I/O In: -  Out: 550 [Urine:550]  Current Meds: Scheduled Meds: . sodium chloride   Intravenous Once  . aspirin  81 mg Oral Daily  . Chlorhexidine Gluconate Cloth  6 each Topical Daily  . citalopram  10 mg Oral Daily  . digoxin  0.125 mg Oral Daily  . finasteride  5 mg Oral Daily  . furosemide  40 mg Oral Daily  . insulin aspart  0-20 Units Subcutaneous TID WC  . insulin aspart  0-5 Units Subcutaneous QHS  . insulin aspart  3 Units Subcutaneous TID WC  . insulin glargine  5 Units Subcutaneous Daily  . levofloxacin  750 mg Oral Daily  . lidocaine  2 patch Transdermal Q24H  . mouth rinse  15 mL Mouth Rinse BID  . pantoprazole  40 mg Oral q morning - 10a  . polyethylene glycol  17 g Oral Daily  . rosuvastatin  40 mg Oral q1800  . sacubitril-valsartan   1 tablet Oral BID  . senna-docusate  1 tablet Oral BID  . sodium chloride flush  10-40 mL Intracatheter Q12H  . sodium chloride flush  3 mL Intravenous Q12H  . spironolactone  25 mg Oral Daily   Continuous Infusions: . sodium chloride    . milrinone 0.25 mcg/kg/min (04/20/19 0125)   PRN Meds:.sodium chloride, acetaminophen, bisacodyl, ipratropium-albuterol, ondansetron (ZOFRAN) IV, sodium chloride flush, sodium chloride flush, traMADol  General appearance: alert, cooperative and no distress Neurologic: intact Heart: regular rate and rhythm, S1, S2 normal, no murmur, click, rub or gallop Lungs: diminished breath sounds bibasilar Abdomen: soft, non-tender; bowel sounds normal; no masses,  no organomegaly Extremities: extremities normal, atraumatic, no cyanosis or edema and Homans sign is negative, no sign of DVT   Lab Results: CBC: Recent Labs    04/19/19 0219 04/20/19 0415  WBC 16.5* 14.0*  HGB 8.8* 9.8*  HCT 26.3* 29.3*  PLT 262 272   BMET:  Recent Labs    04/19/19 0219 04/20/19 0415  NA 128* 129*  K 4.1 4.3  CL 90* 91*  CO2 27 28  GLUCOSE 241* 191*  BUN 11 10  CREATININE 0.87  0.94  CALCIUM 9.0 9.5    CMET: Lab Results  Component Value Date   WBC 14.0 (H) 04/20/2019   HGB 9.8 (L) 04/20/2019   HCT 29.3 (L) 04/20/2019   PLT 272 04/20/2019   GLUCOSE 191 (H) 04/20/2019   CHOL 145 02/15/2019   TRIG 260 (H) 02/15/2019   HDL 55 02/15/2019   LDLDIRECT 49.9 08/26/2013   LDLCALC 59 02/15/2019   ALT 56 (H) 04/20/2019   AST 73 (H) 04/20/2019   NA 129 (L) 04/20/2019   K 4.3 04/20/2019   CL 91 (L) 04/20/2019   CREATININE 0.94 04/20/2019   BUN 10 04/20/2019   CO2 28 04/20/2019   TSH 1.370 03/06/2014   PSA 2.0 02/15/2019   INR 1.4 (H) 04/13/2019   HGBA1C 7.0 (H) 02/15/2019   MICROALBUR 1.5 02/15/2019      PT/INR: No results for input(s): LABPROT, INR in the last 72 hours. Radiology: No results found.   Assessment/Plan: S/P Procedure(s) (LRB):  RIGHT/LEFT HEART CATH AND CORONARY ANGIOGRAPHY (N/A) Mobilize Patient being considered for coronary artery bypass grafting by Dr. Mohammed Kindle, course has been complicated by retroperitoneal bleed and initial presentation with respiratory failure.  Patient is improving slowly but does not appear strong enough for bypass surgery on Monday, possibly later next week    Cameron Daniel 04/20/2019 11:04 AM

## 2019-04-20 NOTE — Progress Notes (Addendum)
Pt is alert and oriented, wife is in bed side and updated, pt is walking to the bathroom, CVP is running between 6 to 10, vitals stable, has a good appetite, denies pain and distress, will continue to monitor Palma Holter, RN

## 2019-04-21 LAB — CBC
HCT: 31.7 % — ABNORMAL LOW (ref 39.0–52.0)
HCT: 33 % — ABNORMAL LOW (ref 39.0–52.0)
Hemoglobin: 10.3 g/dL — ABNORMAL LOW (ref 13.0–17.0)
Hemoglobin: 10.9 g/dL — ABNORMAL LOW (ref 13.0–17.0)
MCH: 29.6 pg (ref 26.0–34.0)
MCH: 29.7 pg (ref 26.0–34.0)
MCHC: 32.5 g/dL (ref 30.0–36.0)
MCHC: 33 g/dL (ref 30.0–36.0)
MCV: 91.1 fL (ref 80.0–100.0)
MCV: 89.9 fL (ref 80.0–100.0)
Platelets: 236 10*3/uL (ref 150–400)
Platelets: 249 10*3/uL (ref 150–400)
RBC: 3.48 MIL/uL — ABNORMAL LOW (ref 4.22–5.81)
RBC: 3.67 MIL/uL — ABNORMAL LOW (ref 4.22–5.81)
RDW: 16.8 % — ABNORMAL HIGH (ref 11.5–15.5)
RDW: 16.9 % — ABNORMAL HIGH (ref 11.5–15.5)
WBC: 12.3 10*3/uL — ABNORMAL HIGH (ref 4.0–10.5)
WBC: 12.3 10*3/uL — ABNORMAL HIGH (ref 4.0–10.5)
nRBC: 1.1 % — ABNORMAL HIGH (ref 0.0–0.2)
nRBC: 1.1 % — ABNORMAL HIGH (ref 0.0–0.2)

## 2019-04-21 LAB — GLUCOSE, CAPILLARY
Glucose-Capillary: 174 mg/dL — ABNORMAL HIGH (ref 70–99)
Glucose-Capillary: 206 mg/dL — ABNORMAL HIGH (ref 70–99)
Glucose-Capillary: 201 mg/dL — ABNORMAL HIGH (ref 70–99)
Glucose-Capillary: 184 mg/dL — ABNORMAL HIGH (ref 70–99)

## 2019-04-21 LAB — COMPREHENSIVE METABOLIC PANEL
ALT: 64 U/L — ABNORMAL HIGH (ref 0–44)
AST: 76 U/L — ABNORMAL HIGH (ref 15–41)
Albumin: 2.5 g/dL — ABNORMAL LOW (ref 3.5–5.0)
Alkaline Phosphatase: 144 U/L — ABNORMAL HIGH (ref 38–126)
Anion gap: 10 (ref 5–15)
BUN: 12 mg/dL (ref 8–23)
CO2: 27 mmol/L (ref 22–32)
Calcium: 9.6 mg/dL (ref 8.9–10.3)
Chloride: 90 mmol/L — ABNORMAL LOW (ref 98–111)
Creatinine, Ser: 0.9 mg/dL (ref 0.61–1.24)
GFR calc Af Amer: >60 mL/min (ref >60)
GFR calc non Af Amer: >60 mL/min (ref >60)
Glucose, Bld: 206 mg/dL — ABNORMAL HIGH (ref 70–99)
Potassium: 4.4 mmol/L (ref 3.5–5.1)
Sodium: 127 mmol/L — ABNORMAL LOW (ref 135–145)
Total Bilirubin: 2.1 mg/dL — ABNORMAL HIGH (ref 0.3–1.2)
Total Protein: 5.5 g/dL — ABNORMAL LOW (ref 6.5–8.1)

## 2019-04-21 LAB — COOXEMETRY PANEL
Carboxyhemoglobin: 2.5 % — ABNORMAL HIGH (ref 0.5–1.5)
Methemoglobin: 1.2 % (ref 0.0–1.5)
O2 Saturation: 59.8 %
Total hemoglobin: 11.7 g/dL — ABNORMAL LOW (ref 12.0–16.0)

## 2019-04-21 LAB — OSMOLALITY, URINE: Osmolality, Ur: 520 mOsm/kg (ref 300–900)

## 2019-04-21 LAB — CREATININE, SERUM
Creatinine, Ser: 1 mg/dL (ref 0.61–1.24)
GFR calc Af Amer: >60 mL/min (ref >60)
GFR calc non Af Amer: >60 mL/min (ref >60)

## 2019-04-21 LAB — NA AND K (SODIUM & POTASSIUM), RAND UR
Potassium Urine: 18 mmol/L
Sodium, Ur: 117 mmol/L

## 2019-04-21 LAB — OSMOLALITY: Osmolality: 271 mOsm/kg — ABNORMAL LOW (ref 275–295)

## 2019-04-21 LAB — TSH: TSH: 2.142 u[IU]/mL (ref 0.350–4.500)

## 2019-04-21 MED ORDER — ENOXAPARIN SODIUM 40 MG/0.4ML ~~LOC~~ SOLN
40.0000 mg | SUBCUTANEOUS | Status: DC
  Administered 2019-04-21 – 2019-04-25 (×5): 40 mg via SUBCUTANEOUS
  Filled 2019-04-21 (×5): qty 0.4

## 2019-04-21 NOTE — Plan of Care (Signed)

## 2019-04-21 NOTE — Progress Notes (Signed)
Pt ambulated in a hallway almost 158ft, tolerated well. Denies pain, SOB and distress, CVP monitoring between 6-10, wife was in bed side for couple of afternoon hours. Pt is alert times 4, vitals stable, appetite good, walking to the bathroom with walker, will continue to monitor the patient  Palma Holter, RN

## 2019-04-21 NOTE — Progress Notes (Signed)
Advanced Heart Failure Rounding Note   Subjective:    On milrinone at 0.25. CO-OX 60%. CVP 7 (checked personally)  Lying in bed this am. Denies CP or SOB. Had some urinary incontinence so condom cath now on.   Hip pain improved. Able to ambulate some and sit up in chair. Hgb stable.   Objective:   Weight Range:  Vital Signs:   Temp:  [97.9 F (36.6 C)-98.1 F (36.7 C)] 98.1 F (36.7 C) (08/23 0729) Pulse Rate:  [87-102] 87 (08/23 0320) Resp:  [20-27] 20 (08/23 0729) BP: (104-135)/(59-76) 104/59 (08/23 0729) SpO2:  [92 %-98 %] 92 % (08/23 0729) Weight:  [111.7 kg] 111.7 kg (08/23 0320) Last BM Date: 04/20/19  Weight change: Filed Weights   04/19/19 0644 04/20/19 0500 04/21/19 0320  Weight: 117.5 kg 115.6 kg 111.7 kg    Intake/Output:   Intake/Output Summary (Last 24 hours) at 04/21/2019 1028 Last data filed at 04/21/2019 0755 Gross per 24 hour  Intake 1007.18 ml  Output 2327 ml  Net -1319.82 ml     Physical Exam: CVP 7 General:  Well appearing. No resp difficulty HEENT: normal Neck: supple. JVP 7. Carotids 2+ bilat; no bruits. No lymphadenopathy or thryomegaly appreciated. Cor: PMI nondisplaced. Regular rate & rhythm. No rubs, gallops or murmurs. Lungs: clear with decreased BS Abdomen: obese soft, nontender, nondistended. No hepatosplenomegaly. No bruits or masses. Good bowel sounds. R flank ecchymosis Extremities: no cyanosis, clubbing, rash, edema Neuro: alert & orientedx3, cranial nerves grossly intact. moves all 4 extremities w/o difficulty. Affect pleasant   Telemetry: [id number] Personally reviewed    Labs: Basic Metabolic Panel: Recent Labs  Lab 04/17/19 0159 04/18/19 0153 04/19/19 0219 04/20/19 0415 04/21/19 0546  NA 130* 131* 128* 129* 127*  K 4.2 4.1 4.1 4.3 4.4  CL 93* 92* 90* 91* 90*  CO2 26 28 27 28 27   GLUCOSE 228* 165* 241* 191* 206*  BUN 26* 19 11 10 12   CREATININE 0.92 0.85 0.87 0.94 0.90  CALCIUM 9.1 9.3 9.0 9.5 9.6     Liver Function Tests: Recent Labs  Lab 04/17/19 0159 04/18/19 0153 04/19/19 0219 04/20/19 0415 04/21/19 0546  AST 72* 65* 54* 73* 76*  ALT 55* 48* 45* 56* 64*  ALKPHOS 88 90 101 129* 144*  BILITOT 0.8 1.3* 1.9* 1.8* 2.1*  PROT 5.1* 5.1* 5.1* 5.6* 5.5*  ALBUMIN 2.5* 2.5* 2.6* 2.6* 2.5*   No results for input(s): LIPASE, AMYLASE in the last 168 hours. No results for input(s): AMMONIA in the last 168 hours.  CBC: Recent Labs  Lab 04/18/19 0153 04/18/19 1004 04/19/19 0219 04/20/19 0415 04/21/19 0546  WBC 20.3* 21.2* 16.5* 14.0* 12.3*  HGB 7.3* 7.3* 8.8* 9.8* 10.3*  HCT 22.4* 22.7* 26.3* 29.3* 31.7*  MCV 91.8 91.2 88.3 88.3 91.1  PLT 315 343 262 272 236    Cardiac Enzymes: No results for input(s): CKTOTAL, CKMB, CKMBINDEX, TROPONINI in the last 168 hours.  BNP: BNP (last 3 results) Recent Labs    04/12/19 1647  BNP 359.3*    ProBNP (last 3 results) No results for input(s): PROBNP in the last 8760 hours.    Other results:  Imaging: No results found.   Medications:     Scheduled Medications: . sodium chloride   Intravenous Once  . aspirin  81 mg Oral Daily  . Chlorhexidine Gluconate Cloth  6 each Topical Daily  . citalopram  10 mg Oral Daily  . digoxin  0.125 mg Oral Daily  .  finasteride  5 mg Oral Daily  . furosemide  40 mg Oral Daily  . insulin aspart  0-20 Units Subcutaneous TID WC  . insulin aspart  0-5 Units Subcutaneous QHS  . insulin aspart  3 Units Subcutaneous TID WC  . insulin glargine  5 Units Subcutaneous Daily  . levofloxacin  750 mg Oral Daily  . lidocaine  2 patch Transdermal Q24H  . mouth rinse  15 mL Mouth Rinse BID  . pantoprazole  40 mg Oral q morning - 10a  . polyethylene glycol  17 g Oral Daily  . rosuvastatin  40 mg Oral q1800  . sacubitril-valsartan  1 tablet Oral BID  . senna-docusate  1 tablet Oral BID  . sodium chloride flush  10-40 mL Intracatheter Q12H  . sodium chloride flush  3 mL Intravenous Q12H  .  spironolactone  25 mg Oral Daily    Infusions: . sodium chloride    . milrinone 0.25 mcg/kg/min (04/20/19 2350)    PRN Medications: sodium chloride, acetaminophen, bisacodyl, ipratropium-albuterol, ondansetron (ZOFRAN) IV, sodium chloride flush, sodium chloride flush, traMADol   Assessment/Plam:   1. Acute Hypoxic Respiratory Failure in the setting of PNA - Intubated on admit and later extubated 8/15.  - much improved. - Treated with azithromycin/ceftriaxone -> CT with persistent dense airspace disease. Today day 3/4 for Levaquin.  - Now on 2 liters oxygen with stable O2 sats  - Continue aggressive pulmonary toilet  - ambulation, flutter valve and IS   2. 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  - CT surgery consulted for possible CABG. On ASA + crestor. - Heparin stopped 8/20 with RP bleed. No further angina. No need to restart heparin  - Hopefully should be ready for CABG by midweek> d/w Dr. Servando Snare -> agree not ready for Monday  3. Acute Systolic HF, ICM - EF 0000000. RV OK  RHC with CI 1.6 CO 3.7.  - Volume status improved. CVP 7 - Continue oral lasix - Continue milrinone 0.25 mcg. CO-OX 60%.  - Continue digoxin 0.25 mg - Continue spiro to 25 mg daily. - Hold carvedilol for now with need for intropes - Continue low-dose  Entresto  4. Diabetes - HgBa1c 7.0  5. Arthritis pain Per Primary.     6. Anemia in the setting or RP bleed -CT showed mod-large RP hematoma at [place] site - 8/20 Given 2 UPRBRCs  Hgb trending u0p. 7.3>8.8 -> 9.8 -> 10.3. - Start SQ heparin at DVT dose  7. Hyponatremia.  -Sodium 127. Restrict free water. Follow BMET daily.  8. Acute delirium - resolved   9. Elevated LFTs and Bili - Liver normal on ab CT earlier this admission. Will follow      Length of Stay: 9   Glori Bickers MD 04/21/2019, 10:28 AM  Advanced Heart Failure  Team Pager [phone] (M-F; 7a - 4p)  Please contact [facility] for night-coverage after hours (4p -7a ) and weekends on [email or web address]

## 2019-04-21 NOTE — Progress Notes (Signed)
[facility]  PROGRESS NOTE  Cameron Daniel [id number] DOB: 1946/08/29 DOA: 04/12/2019 PCP: [name], MD  Brief History    Cameron Daniel is a 73 y.o. year old male with medical history significant for CAD, HTN, DM, Colon polyps, IBS, HLD, GERD with esophageal stricture, Diverticulosis, Depression, Anxiety former smoker  who presented on 04/12/2019 to [facility] long hospital with increased shortness of breath, cough and chills and was found to have acute hypoxic respiratory failure secondary to right lower lobe pneumonia requiring intubation due to worsening hypoxia/respiratory distress on 8/14.  Extubated on 8/15 but due to significant respiratory distress needed BiPAP.  After extubation patient again decompensated requiring BiPAP due to worsening respiratory symptoms, prompting further evaluation by TTE which showed reduced ejection fraction with wall motion on right heart cath/left heart cath on 8/18 found to have severe three-vessel disease elevated PCWP, cardiac index of 1.6.  At that time advanced heart failure was consulted to optimize for CABG.  Course complicated by retroperitoneal hematoma found on 8/20 during evaluation of persistent right groin pain and acute 2 g drop in hemoglobin.  Patient received 2 units packed red blood cells on 8/28 hemoglobin has improved 8.8 from 7.3 in the last 24 hours.  A & P    Retroperitoneal hematoma, stable. Acute on chronic anemia, improving.  After 2 unit blood transfusion this admission hemoglobin continues to improve from 7.3-8.8--10.9, remains clinically stable.  Continue to closely monitor.   CT abdomen showed moderate to large sized retroperitoneal hematoma.  Off heparin gtt x 48 hours, start subcut lovenox  Hypervolemic Acute  Hyponatremia, slightly worsening.  Further review of prior numbers on admission he was 135 and the year prior all values greater than 135. Initially suspected related to hypervolemia due to CHF, we are  diuresing and fluid restriction and Na is slightly worse at 127. Remains asymptomatic Check urine osm, urine na, plasma osm. Check TSH, closely monitor.  Hallucinations,resolved.  Alert and oriented to person, place, time, context.  Was likely delirium related to hospital stay as well as acute retroperitoneal hematoma.    Leukocytosis, improving.  Likely reactive leukocytosis in the setting of acute bleed mentioned above, now downtrending from 20-16 today, continue to monitor.  Patient remains afebrile, blood cultures if spikes fever, continue to closely monitor, no new medications likely contributing.   Acute hypoxic respiratory failure, multifactorial secondary to community-acquired pneumonia and CHF with reduced EF, Completed 5-day course of azithromycin and ceftriaxone.  CT chest on 8/20 shows persistent  right-sided lower lobe consolidation from admission ( do not expect resolution of radiographic findings for several weeks), cough is stable, currently on room air with normal oxygen saturation, complete additional 5 days of Levaquin to complete total 10 day course of antibiotics. continue goal-directed medical therapy for CHF addressed below. Encouraged IS/flutter valve.  NSTEMI Severe three-vessel disease/CAD.   high-sensitivity troponin trend 136--150--1963. Right/left heart cath on 8/18 shows low cardiac output as well as severe three-vessel disease.  Needs further optimization of cardiac index/output currently on milrinone as directed by advanced heart failure team. Hopeful for CABG next week as he progresses clinically, continue aspirin, no need to resume heparin gtt per cardiology, CTS is following  Acute CHF ischemic cardiomyopathy with reduced EF exacerbation.   EF of 25% on TTE with wall motion abnormalities, Per AHF continue oral lasix given improved volume status ( wgt down 9 kg for admission, improved JVD, CVP 7), continue milrinone gtt. continue digoxin, holding Coreg while on  milrinone, spironolactone, low doseentresto follow co-oximetry, monitor ins and outs, daily cMP,   Transaminitis stable AST and ALT in the 70s and 60s respectively, with abdominal pain no focal abnormalities on liver/biliary exam on CT abdomen on admission.  Daily CMP  AKI, resolved.  Suspect prerenal etiology related to previous infection, peak creatinine of 1.5 , creatinine now stable around 1, held home Benicar closely monitor while on milrinone.  Depression, stable continue Celexa  Type 2 diabetes, well controlled, A1c 7 (02/15/2019) FBG > 200, holding home meds will start low Lantus 5 U tonight, add scheduled Novolog 3 U with meals, appreciate diabetes coordinator recommendations,closely monitor CBG, sliding scale as needed  BPH, stable continue Proscar  GERD, stable.  Continue PPI  Hyperlipidemia, stable continue simvastatin  Chronic right hip pain.  Continue supportive care with Tylenol, avoid NSAIDs given CAD   DVT prophylaxis: Start enoxaparin now that hgb stable Code Status: Full code Family Communication: No family at bedside Disposition Plan: Continue milrinone drip to improve cardiac index,  oral lasix, Levaquin, possible CABG next week as he improves clinically, monitor Na   [facility] Direct contact: see [email or web address] (further directions at bottom of note if needed) 7PM-7AM contact night coverage as at bottom of note 04/21/2019, 3:02 PM  LOS: 9 days   Consultants   PCCM, cardiology, CT surgery, advanced heart failure  Procedures  TTE; 04/13/2019   1. The left ventricle has severely reduced systolic function, with an ejection fraction of 25-30%. The cavity size was normal. There is mildly increased left ventricular wall thickness. Left ventricular diastolic Doppler parameters are indeterminate. 2. Severe hypokinesis of the left ventricular, entire inferior wall and anteroseptal wall. 3. Left atrial size was mildly dilated. 4. Small pericardial  effusion. 5. The pericardial effusion is posterior to the left ventricle. 6. The mitral valve is abnormal. Mild thickening of the mitral valve leaflet. There is mild to moderate mitral annular calcification present. 7. The aortic valve is tricuspid. Mild sclerosis of the aortic valve. No stenosis of the aortic valve. 8. The aorta is normal unless otherwise noted. 9. The inferior vena cava was dilated in size with <50% respiratory variability. 10. The interatrial septum was not assessed.    Antibiotics   Levaquin  Ceftriaxone 8/14-8/18  Azithromycin 8/14-8/18  Zosyn 8/14  Interval History/Subjective  Feeling well. NO CP, SOB. Mild cough. No pain  Objective   Vitals:  Vitals:   04/21/19 1032 04/21/19 1141  BP:  114/64  Pulse:  92  Resp:  (!) 24  Temp: 97.9 F (36.6 C) 97.9 F (36.6 C)  SpO2:  97%    Exam:  Awake Alert, Oriented X 3, No new F.N deficits, Normal affect [place].AT, Supple Neck, no appreciable JVD,  no peripheral edema Symmetrical Chest wall movement, Good air movement bilaterally RRR,No Gallops,Rubs or new Murmurs, +ve B.Sounds, Abd Soft, No tenderness,  , No rebound - guarding or rigidity. Bruising in right lower abdomen   I have personally reviewed the following:   Data Reviewed: Basic Metabolic Panel: Recent Labs  Lab 04/17/19 0159 04/18/19 0153 04/19/19 0219 04/20/19 0415 04/21/19 0546 04/21/19 1107  NA 130* 131* 128* 129* 127*  --   K 4.2 4.1 4.1 4.3 4.4  --   CL 93* 92* 90* 91* 90*  --   CO2 26 28 27 28 27   --   GLUCOSE 228* 165* 241* 191* 206*  --   BUN 26* 19 11 10 12   --   CREATININE 0.92 0.85  0.87 0.94 0.90 1.00  CALCIUM 9.1 9.3 9.0 9.5 9.6  --    Liver Function Tests: Recent Labs  Lab 04/17/19 0159 04/18/19 0153 04/19/19 0219 04/20/19 0415 04/21/19 0546  AST 72* 65* 54* 73* 76*  ALT 55* 48* 45* 56* 64*  ALKPHOS 88 90 101 129* 144*  BILITOT 0.8 1.3* 1.9* 1.8* 2.1*  PROT 5.1* 5.1* 5.1* 5.6* 5.5*  ALBUMIN 2.5* 2.5*  2.6* 2.6* 2.5*   No results for input(s): LIPASE, AMYLASE in the last 168 hours. No results for input(s): AMMONIA in the last 168 hours. CBC: Recent Labs  Lab 04/18/19 1004 04/19/19 0219 04/20/19 0415 04/21/19 0546 04/21/19 1107  WBC 21.2* 16.5* 14.0* 12.3* 12.3*  HGB 7.3* 8.8* 9.8* 10.3* 10.9*  HCT 22.7* 26.3* 29.3* 31.7* 33.0*  MCV 91.2 88.3 88.3 91.1 89.9  PLT 343 262 272 236 249   Cardiac Enzymes: No results for input(s): CKTOTAL, CKMB, CKMBINDEX, TROPONINI in the last 168 hours. BNP (last 3 results) Recent Labs    04/12/19 1647  BNP 359.3*    ProBNP (last 3 results) No results for input(s): PROBNP in the last 8760 hours.  CBG: Recent Labs  Lab 04/20/19 1123 04/20/19 1628 04/20/19 2123 04/21/19 0620 04/21/19 1103  GLUCAP 242* 228* 200* 174* 206*    Recent Results (from the past 240 hour(s))  SARS Coronavirus 2 [facility] order, Performed in [facility] hospital lab) Nasopharyngeal Nasopharyngeal Swab     Status: None   Collection Time: 04/12/19  4:47 PM   Specimen: Nasopharyngeal Swab  Result Value Ref Range Status   SARS Coronavirus 2 NEGATIVE NEGATIVE Final    Comment: (NOTE) If result is NEGATIVE SARS-CoV-2 target nucleic acids are NOT DETECTED. The SARS-CoV-2 RNA is generally detectable in upper and lower  respiratory specimens during the acute phase of infection. The lowest  concentration of SARS-CoV-2 viral copies this assay can detect is 250  copies / mL. A negative result does not preclude SARS-CoV-2 infection  and should not be used as the sole basis for treatment or other  patient management decisions.  A negative result may occur with  improper specimen collection / handling, submission of specimen other  than nasopharyngeal swab, presence of viral mutation(s) within the  areas targeted by this assay, and inadequate number of viral copies  (<250 copies / mL). A negative result must be combined with clinical  observations, patient history, and  epidemiological information. If result is POSITIVE SARS-CoV-2 target nucleic acids are DETECTED. The SARS-CoV-2 RNA is generally detectable in upper and lower  respiratory specimens dur ing the acute phase of infection.  Positive  results are indicative of active infection with SARS-CoV-2.  Clinical  correlation with patient history and other diagnostic information is  necessary to determine patient infection status.  Positive results do  not rule out bacterial infection or co-infection with other viruses. If result is PRESUMPTIVE POSTIVE SARS-CoV-2 nucleic acids MAY BE PRESENT.   A presumptive positive result was obtained on the submitted specimen  and confirmed on repeat testing.  While 2019 novel coronavirus  (SARS-CoV-2) nucleic acids may be present in the submitted sample  additional confirmatory testing may be necessary for epidemiological  and / or clinical management purposes  to differentiate between  SARS-CoV-2 and other Sarbecovirus currently known to infect humans.  If clinically indicated additional testing with an alternate test  methodology [phone]) is advised. The SARS-CoV-2 RNA is generally  detectable in upper and lower respiratory sp ecimens during the acute  phase of infection. The expected result is Negative. Fact Sheet for Patients:  StrictlyIdeas.no Fact Sheet for Healthcare Providers: BankingDealers.co.za This test is not yet approved or cleared by the [place] FDA and has been authorized for detection and/or diagnosis of SARS-CoV-2 by FDA under an Emergency Use Authorization (EUA).  This EUA will remain in effect (meaning this test can be used) for the duration of the COVID-19 declaration under Section 564(b)(1) of the Act, 21 U.S.C. section 360bbb-3(b)(1), unless the authorization is terminated or revoked sooner. Performed at [facility], [place] [address], [place], [place] [address]     Culture, blood (Routine X 2) w Reflex to ID Panel     Status: None   Collection Time: 04/12/19  9:14 PM   Specimen: BLOOD RIGHT FOREARM  Result Value Ref Range Status   Specimen Description   Final    BLOOD RIGHT FOREARM Performed at [facility], [place] [address], [place], [place] [address]    Special Requests   Final    BOTTLES DRAWN AEROBIC AND ANAEROBIC Blood Culture adequate volume Performed at [place] [address] [place], [place] [address]    Culture   Final    NO GROWTH 5 DAYS Performed at [facility], [place] [address] [place], [place] [address]    Report Status 04/18/2019 FINAL  Final  Culture, blood (Routine X 2) w Reflex to ID Panel     Status: None   Collection Time: 04/12/19 11:52 PM   Specimen: BLOOD  Result Value Ref Range Status   Specimen Description   Final    BLOOD RIGHT ANTECUBITAL Performed at [place] [address] [place], [place] [address]    Special Requests   Final    BOTTLES DRAWN AEROBIC AND ANAEROBIC Blood Culture adequate volume Performed at [place] [address] [place], [place] [address]    Culture   Final    NO GROWTH 5 DAYS Performed at [facility], [place] [address] [place], [place] [address]    Report Status 04/18/2019 FINAL  Final  MRSA PCR Screening     Status: None   Collection Time: 04/13/19  6:21 PM   Specimen: Nasopharyngeal  Result Value Ref Range Status   MRSA by PCR NEGATIVE NEGATIVE Final    Comment:        The GeneXpert MRSA Assay (FDA approved for NASAL specimens only), is one component of a comprehensive MRSA colonization surveillance program. It is not intended to diagnose MRSA infection nor to guide or monitor treatment for MRSA infections. Performed at [facility], [place] [address], [place], [place] [address]      Studies: No results found.  Scheduled Meds:  sodium chloride   Intravenous Once   aspirin   81 mg Oral Daily   Chlorhexidine Gluconate Cloth  6 each Topical Daily   citalopram  10 mg Oral Daily   digoxin  0.125 mg Oral Daily   enoxaparin (LOVENOX) injection  40 mg Subcutaneous Q24H   finasteride  5 mg Oral Daily   furosemide  40 mg Oral Daily   insulin aspart  0-20 Units Subcutaneous TID WC   insulin aspart  0-5 Units Subcutaneous QHS   insulin aspart  3 Units Subcutaneous TID WC   insulin glargine  5 Units Subcutaneous Daily   levofloxacin  750 mg Oral Daily   lidocaine  2 patch Transdermal Q24H   mouth  rinse  15 mL Mouth Rinse BID   pantoprazole  40 mg Oral q morning - 10a   polyethylene glycol  17 g Oral Daily   rosuvastatin  40 mg Oral q1800   sacubitril-valsartan  1 tablet Oral BID   senna-docusate  1 tablet Oral BID   sodium chloride flush  10-40 mL Intracatheter Q12H   sodium chloride flush  3 mL Intravenous Q12H   spironolactone  25 mg Oral Daily   Continuous Infusions:  sodium chloride     milrinone 0.25 mcg/kg/min (04/21/19 1153)    Principal Problem:   Non-ST elevation (NSTEMI) myocardial infarction [facility]) Active Problems:   Type 2 diabetes mellitus with hyperlipidemia ([place])   Coronary atherosclerosis   GERD   Community acquired pneumonia of right lower lobe of lung ([place])   Acute respiratory failure ([place])   AKI (acute kidney injury) ([place])   Acute systolic HF (heart failure) ([place])   Retroperitoneal hematoma   Acute on chronic blood loss anemia   Hyponatremia      Desiree Hane  [facility]

## 2019-04-22 LAB — GLUCOSE, CAPILLARY
Glucose-Capillary: 182 mg/dL — ABNORMAL HIGH (ref 70–99)
Glucose-Capillary: 239 mg/dL — ABNORMAL HIGH (ref 70–99)
Glucose-Capillary: 264 mg/dL — ABNORMAL HIGH (ref 70–99)
Glucose-Capillary: 236 mg/dL — ABNORMAL HIGH (ref 70–99)

## 2019-04-22 LAB — CBC
HCT: 32.3 % — ABNORMAL LOW (ref 39.0–52.0)
Hemoglobin: 10.3 g/dL — ABNORMAL LOW (ref 13.0–17.0)
MCH: 28.9 pg (ref 26.0–34.0)
MCHC: 31.9 g/dL (ref 30.0–36.0)
MCV: 90.5 fL (ref 80.0–100.0)
Platelets: 245 10*3/uL (ref 150–400)
RBC: 3.57 MIL/uL — ABNORMAL LOW (ref 4.22–5.81)
RDW: 16.7 % — ABNORMAL HIGH (ref 11.5–15.5)
WBC: 12.6 10*3/uL — ABNORMAL HIGH (ref 4.0–10.5)
nRBC: 0.3 % — ABNORMAL HIGH (ref 0.0–0.2)

## 2019-04-22 LAB — COOXEMETRY PANEL
Carboxyhemoglobin: 2.6 % — ABNORMAL HIGH (ref 0.5–1.5)
Methemoglobin: 1.1 % (ref 0.0–1.5)
O2 Saturation: 62.8 %
Total hemoglobin: 12.9 g/dL (ref 12.0–16.0)

## 2019-04-22 LAB — COMPREHENSIVE METABOLIC PANEL
ALT: 61 U/L — ABNORMAL HIGH (ref 0–44)
AST: 64 U/L — ABNORMAL HIGH (ref 15–41)
Albumin: 2.4 g/dL — ABNORMAL LOW (ref 3.5–5.0)
Alkaline Phosphatase: 144 U/L — ABNORMAL HIGH (ref 38–126)
Anion gap: 10 (ref 5–15)
BUN: 15 mg/dL (ref 8–23)
CO2: 27 mmol/L (ref 22–32)
Calcium: 9.5 mg/dL (ref 8.9–10.3)
Chloride: 89 mmol/L — ABNORMAL LOW (ref 98–111)
Creatinine, Ser: 1.09 mg/dL (ref 0.61–1.24)
GFR calc Af Amer: >60 mL/min (ref >60)
GFR calc non Af Amer: >60 mL/min (ref >60)
Glucose, Bld: 198 mg/dL — ABNORMAL HIGH (ref 70–99)
Potassium: 4.5 mmol/L (ref 3.5–5.1)
Sodium: 126 mmol/L — ABNORMAL LOW (ref 135–145)
Total Bilirubin: 2.5 mg/dL — ABNORMAL HIGH (ref 0.3–1.2)
Total Protein: 5.3 g/dL — ABNORMAL LOW (ref 6.5–8.1)

## 2019-04-22 LAB — DIGOXIN LEVEL: Digoxin Level: 0.3 ng/mL — ABNORMAL LOW (ref 0.8–2.0)

## 2019-04-22 MED ORDER — SODIUM CHLORIDE 0.9 % IV BOLUS
250.0000 mL | Freq: Once | INTRAVENOUS | Status: AC
  Administered 2019-04-22: 250 mL via INTRAVENOUS

## 2019-04-22 MED ORDER — SODIUM CHLORIDE 0.9 % IV BOLUS
500.0000 mL | Freq: Once | INTRAVENOUS | Status: AC
  Administered 2019-04-22: 500 mL via INTRAVENOUS

## 2019-04-22 MED ORDER — INSULIN ASPART 100 UNIT/ML ~~LOC~~ SOLN
6.0000 [IU] | Freq: Three times a day (TID) | SUBCUTANEOUS | Status: DC
  Administered 2019-04-22 – 2019-04-25 (×10): 6 [IU] via SUBCUTANEOUS

## 2019-04-22 MED ORDER — LACTULOSE ENEMA
300.0000 mL | ORAL | Status: DC | PRN
  Filled 2019-04-22: qty 300

## 2019-04-22 MED ORDER — POLYETHYLENE GLYCOL 3350 17 G PO PACK
17.0000 g | PACK | Freq: Two times a day (BID) | ORAL | Status: DC
  Administered 2019-04-22 – 2019-04-25 (×7): 17 g via ORAL
  Filled 2019-04-22 (×7): qty 1

## 2019-04-22 MED ORDER — SENNOSIDES-DOCUSATE SODIUM 8.6-50 MG PO TABS
2.0000 | ORAL_TABLET | Freq: Two times a day (BID) | ORAL | Status: DC
  Administered 2019-04-22 – 2019-04-25 (×7): 2 via ORAL
  Filled 2019-04-22 (×7): qty 2

## 2019-04-22 NOTE — Progress Notes (Signed)
   04/22/19 2009  Vitals  Temp (!) 97.5 F (36.4 C)  Temp Source Oral  BP (!) 76/48  MAP (mmHg) (!) 59  BP Location Left Arm  BP Method Automatic  Patient Position (if appropriate) Lying  Pulse Rate 98  Pulse Rate Source Monitor  ECG Heart Rate 95  Resp 17  Oxygen Therapy  SpO2 95 %   Patient's BP still dropping despite 500 ml NS bolus 4 hours earlier. Cardiologist paged and an additional 250 ml NS bolus ordered and started. Will monitor closely.

## 2019-04-22 NOTE — Plan of Care (Signed)

## 2019-04-22 NOTE — Progress Notes (Signed)
CARDIAC REHAB PHASE I    MODE:  Ambulation: 100 ft   POST:  Rate/Rhythm: 105 ST  BP:  Sitting: 91/61    SaO2: 97 RA  Pt about to walk with NT when cardiac rehab arrived on floor. Pt ambulated 146ft standby assist with front wheel walker. Pt denies pain, dizziness, or SOB. Pt returned to bed. Began preop ed with pt and wife. Practiced IS and Flutter. Pt able to get around 1500 on IS. Reviewed importance of sitting in the chair, using Flutter and IS, and walking to help strengthen him prior to surgery, and then help him recover after. Wife and pt agreeable. Will continue to follow to ambulate with pt while he awaits surgery.  [id number] [name], RN BSN 04/22/2019 2:36 PM

## 2019-04-22 NOTE — Care Management Important Message (Signed)
Important Message  Patient Details  Name: Cameron Daniel MRN: [id number] Date of Birth: 1946-03-24   Medicare Important Message Given:  Yes     Memory Argue 04/22/2019, 2:59 PM

## 2019-04-22 NOTE — Progress Notes (Signed)
Physical Therapy Treatment Patient Details Name: Cameron Daniel MRN: [id number] DOB: 12-May-1946 Today's Date: 04/22/2019    History of Present Illness 73yo male with hx CAD, HTN, DM who was seen 8/14 in ED with concerns for dehydration after working outside in the heat. He was given IV fluids and d/c. He returned 8/14 with chills, fatigue, cough, dyspnea.  CT chest with RLL consolidation, mildly elevated troponin.  Had progressive respiratory failure with hypoxia, worsening tachypnea and ultimately required intubation in ER. Admitted 04/12/19 for treatment of NSTEMI and PNA. Intubation ended 04/13/19, Cardiac cath 8/18    PT Comments    Eager to participate.  Quick to fatigue, but moved safely.  Emphasis on sit to stand, standing exercise and progressing gait stability and stamina.    Follow Up Recommendations  Home health PT;Supervision for mobility/OOB     Equipment Recommendations  Rolling walker with 5" wheels    Recommendations for Other Services       Precautions / Restrictions Precautions Precautions: None    Mobility  Bed Mobility               General bed mobility comments: OOB in the chair on arrival  Transfers Overall transfer level: Needs assistance Equipment used: None;Rolling walker (2 wheeled) Transfers: Sit to/from Stand Sit to Stand: Min guard         General transfer comment: cues for safer hand placement  Ambulation/Gait Ambulation/Gait assistance: Min guard;Min assist(min to help with RW) Gait Distance (Feet): 100 Feet Assistive device: Rolling walker (2 wheeled) Gait Pattern/deviations: Step-through pattern Gait velocity: slower   General Gait Details: generally steady, flexed posture, tentative, HR low 100's and sats on RA 98%   Stairs             Wheelchair Mobility    Modified Rankin (Stroke Patients Only)       Balance     Sitting balance-Leahy Scale: Fair     Standing balance support: Single extremity  supported;Bilateral upper extremity supported Standing balance-Leahy Scale: Poor Standing balance comment: minor reliance on AD                             Cognition Arousal/Alertness: Awake/alert Behavior During Therapy: WFL for tasks assessed/performed Overall Cognitive Status: Within Functional Limits for tasks assessed                                        Exercises General Exercises - Lower Extremity Hip ABduction/ADduction: AROM;10 reps;Standing;Both Hip Flexion/Marching: AROM;Strengthening;Both;10 reps;Standing    General Comments        Pertinent Vitals/Pain Pain Assessment: Faces Faces Pain Scale: Hurts little more Pain Location: Rhip Pain Descriptors / Indicators: Discomfort;Grimacing;Sore Pain Intervention(s): Monitored during session    Home Living                      Prior Function            PT Goals (current goals can now be found in the care plan section) Acute Rehab PT Goals Patient Stated Goal: go fishing PT Goal Formulation: With patient Time For Goal Achievement: 05/01/19 Potential to Achieve Goals: Good Progress towards PT goals: Progressing toward goals    Frequency    Min 3X/week      PT Plan Current plan remains appropriate    Co-evaluation  AM-PAC PT "6 Clicks" Mobility   Outcome Measure  Help needed turning from your back to your side while in a flat bed without using bedrails?: None Help needed moving from lying on your back to sitting on the side of a flat bed without using bedrails?: A Little Help needed moving to and from a bed to a chair (including a wheelchair)?: A Little Help needed standing up from a chair using your arms (e.g., wheelchair or bedside chair)?: A Little Help needed to walk in hospital room?: A Little Help needed climbing 3-5 steps with a railing? : A Little 6 Click Score: 19    End of Session   Activity Tolerance: Patient limited by  fatigue;Patient tolerated treatment well Patient left: in chair;with call bell/phone within reach;with chair alarm set Nurse Communication: Mobility status PT Visit Diagnosis: Other abnormalities of gait and mobility (R26.89);Unsteadiness on feet (R26.81)     Time: [id number] PT Time Calculation (min) (ACUTE ONLY): 18 min  Charges:  $Gait Training: 8-22 mins                     04/22/2019  Donnella Sham, PT Acute Rehabilitation Services [phone]  (pager) [phone]  (office)   Cameron Daniel 04/22/2019, 11:50 AM

## 2019-04-22 NOTE — Progress Notes (Signed)
[facility]  PROGRESS NOTE  Cameron Daniel [id number] DOB: Oct 13, 1945 DOA: 04/12/2019 PCP: [name], MD  Brief History    Cameron Daniel is a 73 y.o. year old male with medical history significant for CAD, HTN, DM, Colon polyps, IBS, HLD, GERD with esophageal stricture, Diverticulosis, Depression, Anxiety former smoker  who presented on 04/12/2019 to [facility] with increased shortness of breath, cough and chills and was found to have acute hypoxic respiratory failure secondary to right lower lobe pneumonia requiring intubation due to worsening hypoxia/respiratory distress on 8/14.  Extubated on 8/15 but due to significant respiratory distress needed BiPAP.  After extubation patient again decompensated requiring BiPAP due to worsening respiratory symptoms, prompting further evaluation by TTE which showed reduced ejection fraction with wall motion on right heart cath/left heart cath on 8/18 found to have severe three-vessel disease elevated PCWP, cardiac index of 1.6.  At that time advanced heart failure was consulted to optimize for CABG.  Course complicated by retroperitoneal hematoma found on 8/20 during evaluation of persistent right groin pain and acute 2 g drop in hemoglobin.  Patient received 2 units packed red blood cells on 8/28 hemoglobin has improved 8.8 from 7.3 in the last 24 hours.  A & P    Retroperitoneal hematoma, stable. Acute on chronic anemia, improving.  After 2 unit blood transfusion this admission hemoglobin continues to improve from 7.3-8.8--10.9--10.3, remains clinically stable.  Continue to closely monitor.   CT abdomen showed moderate to large sized retroperitoneal hematoma.  Off heparin gtt x 48 hours, start subcut lovenox  Hypervolemic Acute  Hyponatremia, slightly worsening.  Initially suspected related to hypervolemia due to CHF but now seems euvolemic. TSH wnl. Elevated urine na and urine osm suggest SIADH given not likely  glucocorticoid deficiency and other diagnoses ruled out. Have changed diet to fluid restriction of 1200 cc. Repeat BMP in am  (na 126, asymptomatic)  Hallucinations,resolved.  Alert and oriented to person, place, time, context.  Was likely delirium related to hospital stay as well as acute retroperitoneal hematoma.    Leukocytosis, improving.  Likely reactive leukocytosis in the setting of acute bleed mentioned above, now downtrending from 20-16--12.6 today, continue to monitor.  Patient remains afebrile, blood cultures if spikes fever, continue to closely monitor, no new medications likely contributing.   Acute hypoxic respiratory failure, multifactorial secondary to community-acquired pneumonia and CHF with reduced EF, Completed 5-day course of azithromycin and ceftriaxone.  CT chest on 8/20 shows persistent  right-sided lower lobe consolidation from admission ( do not expect resolution of radiographic findings for several weeks), cough is stable, currently on room air with normal oxygen saturation, complete additional 5 days of Levaquin to complete total 10 day course of antibiotics. continue goal-directed medical therapy for CHF addressed below. Encouraged IS/flutter valve.  NSTEMI Severe three-vessel disease/CAD.   high-sensitivity troponin trend 136--150--1963. Right/left heart cath on 8/18 shows low cardiac output as well as severe three-vessel disease.  Needs further optimization of cardiac index/output currently on milrinone as directed by advanced heart failure team. Hopeful for CABG next week as he progresses clinically, continue aspirin, no need to resume heparin gtt per cardiology, CTS is following  Acute CHF ischemic cardiomyopathy with reduced EF exacerbation.   EF of 25% on TTE with wall motion abnormalities, Per AHF hold diuresis given euvolemia and looks a bit dry, continue milrinone gtt. continue digoxin, holding Coreg while on milrinone, spironolactone, low doseentresto follow  co-oximetry, monitor ins and outs, daily cMP,  Transaminitis stable AST and ALT in the 70s and 60s respectively, with abdominal pain no focal abnormalities on liver/biliary exam on CT abdomen on admission.  Daily CMP  AKI, resolved.  Suspect prerenal etiology related to previous infection, peak creatinine of 1.5 , creatinine now stable around 1, held home Benicar closely monitor while on milrinone.  Depression, stable continue Celexa  Type 2 diabetes, well controlled, A1c 7 (02/15/2019) FBG > 200, holding home meds ,  Lantus 5 U started in hospital,  scheduled Novolog 6 U with meals, appreciate diabetes coordinator recommendations,closely monitor CBG, sliding scale as needed  BPH, stable continue Proscar  GERD, stable.  Continue PPI  Hyperlipidemia, stable continue simvastatin  Chronic right hip pain.  Continue supportive care with Tylenol, avoid NSAIDs given CAD   DVT prophylaxis: Start enoxaparin now that hgb stable Code Status: Full code Family Communication: No family at bedside Disposition Plan: Continue milrinone drip to improve cardiac index,  Hold lasix, Levaquin, possible CABG next week as he improves clinically, monitor Na   [facility] Direct contact: see [email or web address] (further directions at bottom of note if needed) 7PM-7AM contact night coverage as at bottom of note 04/22/2019, 11:13 AM  LOS: 10 days   Consultants  . PCCM, cardiology, CT surgery, advanced heart failure  Procedures  TTE; 04/13/2019   1. The left ventricle has severely reduced systolic function, with an ejection fraction of 25-30%. The cavity size was normal. There is mildly increased left ventricular wall thickness. Left ventricular diastolic Doppler parameters are indeterminate. 2. Severe hypokinesis of the left ventricular, entire inferior wall and anteroseptal wall. 3. Left atrial size was mildly dilated. 4. Small pericardial effusion. 5. The pericardial effusion is posterior to the left  ventricle. 6. The mitral valve is abnormal. Mild thickening of the mitral valve leaflet. There is mild to moderate mitral annular calcification present. 7. The aortic valve is tricuspid. Mild sclerosis of the aortic valve. No stenosis of the aortic valve. 8. The aorta is normal unless otherwise noted. 9. The inferior vena cava was dilated in size with <50% respiratory variability. 10. The interatrial septum was not assessed.    Antibiotics  . Levaquin . Ceftriaxone 8/14-8/18 . Azithromycin 8/14-8/18 . Zosyn 8/14  Interval History/Subjective  No cough. Feels very well. Wants to have a BM  Objective   Vitals:  Vitals:   04/22/19 0716 04/22/19 0913  BP:    Pulse:  97  Resp:    Temp: 98.3 F (36.8 C)   SpO2:      Exam:  Awake Alert, Oriented X 3, No new F.N deficits, Normal affect .AT, Supple Neck, no appreciable JVD,  no peripheral edema Symmetrical Chest wall movement, Good air movement bilaterally RRR,No Gallops,Rubs or new Murmurs, +ve B.Sounds, Abd Soft, No tenderness,  , No rebound - guarding or rigidity. Bruising in right lower abdomen   I have personally reviewed the following:   Data Reviewed: Basic Metabolic Panel: Recent Labs  Lab 04/18/19 0153 04/19/19 0219 04/20/19 0415 04/21/19 0546 04/21/19 1107 04/22/19 0411  NA 131* 128* 129* 127*  --  126*  K 4.1 4.1 4.3 4.4  --  4.5  CL 92* 90* 91* 90*  --  89*  CO2 28 27 28 27   --  27  GLUCOSE 165* 241* 191* 206*  --  198*  BUN 19 11 10 12   --  15  CREATININE 0.85 0.87 0.94 0.90 1.00 1.09  CALCIUM 9.3 9.0 9.5 9.6  --  9.5  Liver Function Tests: Recent Labs  Lab 04/18/19 0153 04/19/19 0219 04/20/19 0415 04/21/19 0546 04/22/19 0411  AST 65* 54* 73* 76* 64*  ALT 48* 45* 56* 64* 61*  ALKPHOS 90 101 129* 144* 144*  BILITOT 1.3* 1.9* 1.8* 2.1* 2.5*  PROT 5.1* 5.1* 5.6* 5.5* 5.3*  ALBUMIN 2.5* 2.6* 2.6* 2.5* 2.4*   No results for input(s): LIPASE, AMYLASE in the last 168 hours. No results  for input(s): AMMONIA in the last 168 hours. CBC: Recent Labs  Lab 04/19/19 0219 04/20/19 0415 04/21/19 0546 04/21/19 1107 04/22/19 0411  WBC 16.5* 14.0* 12.3* 12.3* 12.6*  HGB 8.8* 9.8* 10.3* 10.9* 10.3*  HCT 26.3* 29.3* 31.7* 33.0* 32.3*  MCV 88.3 88.3 91.1 89.9 90.5  PLT 262 272 236 249 245   Cardiac Enzymes: No results for input(s): CKTOTAL, CKMB, CKMBINDEX, TROPONINI in the last 168 hours. BNP (last 3 results) Recent Labs    04/12/19 1647  BNP 359.3*    ProBNP (last 3 results) No results for input(s): PROBNP in the last 8760 hours.  CBG: Recent Labs  Lab 04/21/19 0620 04/21/19 1103 04/21/19 1632 04/21/19 2117 04/22/19 0632  GLUCAP 174* 206* 201* 184* 182*    Recent Results (from the past 240 hour(s))  SARS Coronavirus 2 [facility] order, Performed in [facility] hospital lab) Nasopharyngeal Nasopharyngeal Swab     Status: None   Collection Time: 04/12/19  4:47 PM   Specimen: Nasopharyngeal Swab  Result Value Ref Range Status   SARS Coronavirus 2 NEGATIVE NEGATIVE Final    Comment: (NOTE) If result is NEGATIVE SARS-CoV-2 target nucleic acids are NOT DETECTED. The SARS-CoV-2 RNA is generally detectable in upper and lower  respiratory specimens during the acute phase of infection. The lowest  concentration of SARS-CoV-2 viral copies this assay can detect is 250  copies / mL. A negative result does not preclude SARS-CoV-2 infection  and should not be used as the sole basis for treatment or other  patient management decisions.  A negative result may occur with  improper specimen collection / handling, submission of specimen other  than nasopharyngeal swab, presence of viral mutation(s) within the  areas targeted by this assay, and inadequate number of viral copies  (<250 copies / mL). A negative result must be combined with clinical  observations, patient history, and epidemiological information. If result is POSITIVE SARS-CoV-2 target nucleic acids are  DETECTED. The SARS-CoV-2 RNA is generally detectable in upper and lower  respiratory specimens dur ing the acute phase of infection.  Positive  results are indicative of active infection with SARS-CoV-2.  Clinical  correlation with patient history and other diagnostic information is  necessary to determine patient infection status.  Positive results do  not rule out bacterial infection or co-infection with other viruses. If result is PRESUMPTIVE POSTIVE SARS-CoV-2 nucleic acids MAY BE PRESENT.   A presumptive positive result was obtained on the submitted specimen  and confirmed on repeat testing.  While 2019 novel coronavirus  (SARS-CoV-2) nucleic acids may be present in the submitted sample  additional confirmatory testing may be necessary for epidemiological  and / or clinical management purposes  to differentiate between  SARS-CoV-2 and other Sarbecovirus currently known to infect humans.  If clinically indicated additional testing with an alternate test  methodology [phone]) is advised. The SARS-CoV-2 RNA is generally  detectable in upper and lower respiratory sp ecimens during the acute  phase of infection. The expected result is Negative. Fact Sheet for Patients:  StrictlyIdeas.no Fact Sheet  for Healthcare Providers: BankingDealers.co.za This test is not yet approved or cleared by the [place] and has been authorized for detection and/or diagnosis of SARS-CoV-2 by FDA under an Emergency Use Authorization (EUA).  This EUA will remain in effect (meaning this test can be used) for the duration of the COVID-19 declaration under Section 564(b)(1) of the Act, 21 U.S.C. section 360bbb-3(b)(1), unless the authorization is terminated or revoked sooner. Performed at [facility], [place] [address] [place], [place] [address]   Culture, blood (Routine X 2) w Reflex to ID Panel     Status: None   Collection Time:  04/12/19  9:14 PM   Specimen: BLOOD RIGHT FOREARM  Result Value Ref Range Status   Specimen Description   Final    BLOOD RIGHT FOREARM Performed at [facility], [place] [address], [place], [place] [address]    Special Requests   Final    BOTTLES DRAWN AEROBIC AND ANAEROBIC Blood Culture adequate volume Performed at [place] [address] [place], [place] [address]    Culture   Final    NO GROWTH 5 DAYS Performed at [facility], [place] [address] [place], [place] [address]    Report Status 04/18/2019 FINAL  Final  Culture, blood (Routine X 2) w Reflex to ID Panel     Status: None   Collection Time: 04/12/19 11:52 PM   Specimen: BLOOD  Result Value Ref Range Status   Specimen Description   Final    BLOOD RIGHT ANTECUBITAL Performed at [place] [address], [place], [place] [address]    Special Requests   Final    BOTTLES DRAWN AEROBIC AND ANAEROBIC Blood Culture adequate volume Performed at [place] [address] [place], [place] [address]    Culture   Final    NO GROWTH 5 DAYS Performed at [facility], [place] [address], [place], [place] [address]    Report Status 04/18/2019 FINAL  Final  MRSA PCR Screening     Status: None   Collection Time: 04/13/19  6:21 PM   Specimen: Nasopharyngeal  Result Value Ref Range Status   MRSA by PCR NEGATIVE NEGATIVE Final    Comment:        The GeneXpert MRSA Assay (FDA approved for NASAL specimens only), is one component of a comprehensive MRSA colonization surveillance program. It is not intended to diagnose MRSA infection nor to guide or monitor treatment for MRSA infections. Performed at [facility], [place] [address], [place], [place] [address]      Studies: No results found.  Scheduled Meds: . sodium chloride   Intravenous Once  . aspirin  81 mg Oral Daily  . Chlorhexidine Gluconate Cloth  6 each Topical Daily  . citalopram   10 mg Oral Daily  . digoxin  0.125 mg Oral Daily  . enoxaparin (LOVENOX) injection  40 mg Subcutaneous Q24H  . finasteride  5 mg Oral Daily  . insulin aspart  0-20 Units Subcutaneous TID WC  . insulin aspart  0-5 Units Subcutaneous QHS  . insulin aspart  3 Units Subcutaneous TID WC  . insulin glargine  5 Units Subcutaneous Daily  . levofloxacin  750 mg Oral Daily  . lidocaine  2 patch Transdermal Q24H  . mouth rinse  15 mL Mouth Rinse BID  . pantoprazole  40 mg Oral q morning - 10a  . polyethylene glycol  17 g  Oral Daily  . rosuvastatin  40 mg Oral q1800  . sacubitril-valsartan  1 tablet Oral BID  . senna-docusate  1 tablet Oral BID  . sodium chloride flush  10-40 mL Intracatheter Q12H  . sodium chloride flush  3 mL Intravenous Q12H  . spironolactone  25 mg Oral Daily   Continuous Infusions: . sodium chloride    . milrinone 0.25 mcg/kg/min (04/22/19 0002)    Principal Problem:   Non-ST elevation (NSTEMI) myocardial infarction [facility]) Active Problems:   Type 2 diabetes mellitus with hyperlipidemia (HCC)   Coronary atherosclerosis   GERD   Community acquired pneumonia of right lower lobe of lung ([place])   Acute respiratory failure (HCC)   AKI (acute kidney injury) ([place])   Acute systolic HF (heart failure) (HCC)   Retroperitoneal hematoma   Acute on chronic blood loss anemia   Hyponatremia      Cameron Daniel  [facility]

## 2019-04-22 NOTE — Progress Notes (Signed)
Advanced Heart Failure Rounding Note   Subjective:    On milrinone at 0.25. CO-OX 63%.  Feels much better. Denies CP/SOB. Hip pain resolved.   Objective:   Weight Range:  Vital Signs:   Temp:  [97.9 F (36.6 C)-98.5 F (36.9 C)] 98.3 F (36.8 C) (08/24 0716) Pulse Rate:  [87-97] 97 (08/24 0913) Resp:  [20-28] 24 (08/24 0309) BP: (105-117)/(56-64) 107/56 (08/24 0309) SpO2:  [96 %-97 %] 96 % (08/24 0309) Weight:  [111.8 kg] 111.8 kg (08/24 0309) Last BM Date: 04/20/19  Weight change: Filed Weights   04/20/19 0500 04/21/19 0320 04/22/19 0309  Weight: 115.6 kg 111.7 kg 111.8 kg    Intake/Output:   Intake/Output Summary (Last 24 hours) at 04/22/2019 0947 Last data filed at 04/22/2019 0300 Gross per 24 hour  Intake 1039.36 ml  Output 801 ml  Net 238.36 ml     Physical Exam: General:  Well appearing. No resp difficulty HEENT: normal Neck: supple. no JVD. Carotids 2+ bilat; no bruits. No lymphadenopathy or thryomegaly appreciated. Cor: PMI nondisplaced. Regular rate & rhythm. No rubs, gallops or murmurs. Lungs: clear Abdomen: soft, nontender, nondistended. No hepatosplenomegaly. No bruits or masses. Good bowel sounds. R flank ecchymosis Extremities: no cyanosis, clubbing, rash, edema Neuro: alert & orientedx3, cranial nerves grossly intact. moves all 4 extremities w/o difficulty. Affect pleasant    Telemetry: [id number] Personally reviewed    Labs: Basic Metabolic Panel: Recent Labs  Lab 04/18/19 0153 04/19/19 0219 04/20/19 0415 04/21/19 0546 04/21/19 1107 04/22/19 0411  NA 131* 128* 129* 127*  --  126*  K 4.1 4.1 4.3 4.4  --  4.5  CL 92* 90* 91* 90*  --  89*  CO2 28 27 28 27   --  27  GLUCOSE 165* 241* 191* 206*  --  198*  BUN 19 11 10 12   --  15  CREATININE 0.85 0.87 0.94 0.90 1.00 1.09  CALCIUM 9.3 9.0 9.5 9.6  --  9.5    Liver Function Tests: Recent Labs  Lab 04/18/19 0153 04/19/19 0219 04/20/19 0415 04/21/19 0546 04/22/19 0411  AST  65* 54* 73* 76* 64*  ALT 48* 45* 56* 64* 61*  ALKPHOS 90 101 129* 144* 144*  BILITOT 1.3* 1.9* 1.8* 2.1* 2.5*  PROT 5.1* 5.1* 5.6* 5.5* 5.3*  ALBUMIN 2.5* 2.6* 2.6* 2.5* 2.4*   No results for input(s): LIPASE, AMYLASE in the last 168 hours. No results for input(s): AMMONIA in the last 168 hours.  CBC: Recent Labs  Lab 04/19/19 0219 04/20/19 0415 04/21/19 0546 04/21/19 1107 04/22/19 0411  WBC 16.5* 14.0* 12.3* 12.3* 12.6*  HGB 8.8* 9.8* 10.3* 10.9* 10.3*  HCT 26.3* 29.3* 31.7* 33.0* 32.3*  MCV 88.3 88.3 91.1 89.9 90.5  PLT 262 272 236 249 245    Cardiac Enzymes: No results for input(s): CKTOTAL, CKMB, CKMBINDEX, TROPONINI in the last 168 hours.  BNP: BNP (last 3 results) Recent Labs    04/12/19 1647  BNP 359.3*    ProBNP (last 3 results) No results for input(s): PROBNP in the last 8760 hours.    Other results:  Imaging: No results found.   Medications:     Scheduled Medications: . sodium chloride   Intravenous Once  . aspirin  81 mg Oral Daily  . Chlorhexidine Gluconate Cloth  6 each Topical Daily  . citalopram  10 mg Oral Daily  . digoxin  0.125 mg Oral Daily  . enoxaparin (LOVENOX) injection  40 mg Subcutaneous Q24H  .  finasteride  5 mg Oral Daily  . furosemide  40 mg Oral Daily  . insulin aspart  0-20 Units Subcutaneous TID WC  . insulin aspart  0-5 Units Subcutaneous QHS  . insulin aspart  3 Units Subcutaneous TID WC  . insulin glargine  5 Units Subcutaneous Daily  . levofloxacin  750 mg Oral Daily  . lidocaine  2 patch Transdermal Q24H  . mouth rinse  15 mL Mouth Rinse BID  . pantoprazole  40 mg Oral q morning - 10a  . polyethylene glycol  17 g Oral Daily  . rosuvastatin  40 mg Oral q1800  . sacubitril-valsartan  1 tablet Oral BID  . senna-docusate  1 tablet Oral BID  . sodium chloride flush  10-40 mL Intracatheter Q12H  . sodium chloride flush  3 mL Intravenous Q12H  . spironolactone  25 mg Oral Daily    Infusions: . sodium chloride     . milrinone 0.25 mcg/kg/min (04/22/19 0002)    PRN Medications: sodium chloride, acetaminophen, bisacodyl, ipratropium-albuterol, ondansetron (ZOFRAN) IV, sodium chloride flush, sodium chloride flush, traMADol   Assessment/Plam:   1. Acute Hypoxic Respiratory Failure in the setting of PNA - Intubated on admit and later extubated 8/15.  - much improved. - Treated with azithromycin/ceftriaxone -> CT with persistent dense airspace disease. Today day 3/4 for Levaquin.  - Now on 2 liters oxygen with stable O2 sats  - Continue aggressive pulmonary toilet  - ambulation, flutter valve and IS   2. 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  - CT surgery consulted for possible CABG. On ASA + crestor. - Heparin stopped 8/20 with RP bleed. No further angina. No need to restart heparin  - Hopefully should be ready for CABG by midweek will d/w with Dr. Cyndia Bent  3. Acute Systolic HF, ICM - EF 0000000. RV OK  RHC with CI 1.6 CO 3.7.  - Volume status improved.  - Looks a bit dry. Will hold lasix today.  - Continue milrinone 0.25 mcg. CO-OX 63%.  - Continue digoxin 0.25 mg - Continue spiro to 25 mg daily. - Hold carvedilol for now with need for intropes - Continue low-dose  Entresto  4. Diabetes - HgBa1c 7.0  5. Arthritis pain Per Primary.     6. Anemia in the setting or RP bleed -CT showed mod-large RP hematoma at [place] site - 8/20 Given 2 UPRBRCs  Hgb trending u0p. 7.3>8.8 -> 9.8 -> 10.3-> 10.3 - Tolerating SQ heparin at DVT dose  7. Hyponatremia.  -Sodium 126 (worse today). Restrict free water. Follow BMET daily. - If drops further will give tolvaptan   8. Acute delirium - resolved   9. Elevated LFTs and Bili - Liver normal on ab CT earlier this admission. Will follow      Length of Stay: 10   Glori Bickers MD 04/22/2019, 9:47 AM  Advanced Heart Failure Team Pager  [phone] (M-F; 7a - 4p)  Please contact [facility] for night-coverage after hours (4p -7a ) and weekends on [email or web address]

## 2019-04-22 NOTE — Progress Notes (Addendum)
Inpatient Diabetes Program Recommendations  AACE/ADA: New Consensus Statement on Inpatient Glycemic Control   Target Ranges:  Prepandial:   less than 140 mg/dL      Peak postprandial:   less than 180 mg/dL (1-2 hours)      Critically ill patients:  140 - 180 mg/dL  Results for [name], [name] (MRN [id number]) as of 04/22/2019 09:16  Ref. Range 04/21/2019 06:20 04/21/2019 11:03 04/21/2019 16:32 04/21/2019 21:17 04/22/2019 06:32  Glucose-Capillary Latest Ref Range: 70 - 99 mg/dL 174 (H) 206 (H) 201 (H) 184 (H) 182 (H)    Review of Glycemic Control  Diabetes history:DM2 Outpatient Diabetes medications:Glipizide 5 mg QAM, Actos 30 mg daily Current orders for Inpatient glycemic control:Lantus 5 units daily, Novolog 0-20 units TID with meals, Novolog 0-5 units QHS, Novolog 3 units TID with meals for meal coverage  Inpatient Diabetes Program Recommendations:   Insulin - Meal Coverage: Please consider increasing meal coverage to Novolog 6 units TID with meals if patient eats at least 50% of meals.  Thanks, Barnie Alderman, RN, MSN, CDE Diabetes Coordinator Inpatient Diabetes Program [phone] (Team Pager from 8am to 5pm)

## 2019-04-22 NOTE — Progress Notes (Signed)
Patient resting comfortably, not in any respiratory distress, patient states that we will not be wearing the BIPAP this evening.

## 2019-04-23 LAB — COMPREHENSIVE METABOLIC PANEL
ALT: 56 U/L — ABNORMAL HIGH (ref 0–44)
AST: 61 U/L — ABNORMAL HIGH (ref 15–41)
Albumin: 2.3 g/dL — ABNORMAL LOW (ref 3.5–5.0)
Alkaline Phosphatase: 154 U/L — ABNORMAL HIGH (ref 38–126)
Anion gap: 8 (ref 5–15)
BUN: 22 mg/dL (ref 8–23)
CO2: 25 mmol/L (ref 22–32)
Calcium: 8.9 mg/dL (ref 8.9–10.3)
Chloride: 92 mmol/L — ABNORMAL LOW (ref 98–111)
Creatinine, Ser: 1.35 mg/dL — ABNORMAL HIGH (ref 0.61–1.24)
GFR calc Af Amer: >60 mL/min (ref >60)
GFR calc non Af Amer: 52 mL/min — ABNORMAL LOW (ref >60)
Glucose, Bld: 209 mg/dL — ABNORMAL HIGH (ref 70–99)
Potassium: 4.5 mmol/L (ref 3.5–5.1)
Sodium: 125 mmol/L — ABNORMAL LOW (ref 135–145)
Total Bilirubin: 2.1 mg/dL — ABNORMAL HIGH (ref 0.3–1.2)
Total Protein: 4.8 g/dL — ABNORMAL LOW (ref 6.5–8.1)

## 2019-04-23 LAB — GLUCOSE, CAPILLARY
Glucose-Capillary: 182 mg/dL — ABNORMAL HIGH (ref 70–99)
Glucose-Capillary: 221 mg/dL — ABNORMAL HIGH (ref 70–99)
Glucose-Capillary: 161 mg/dL — ABNORMAL HIGH (ref 70–99)
Glucose-Capillary: 162 mg/dL — ABNORMAL HIGH (ref 70–99)

## 2019-04-23 LAB — CBC
HCT: 30.8 % — ABNORMAL LOW (ref 39.0–52.0)
Hemoglobin: 10.4 g/dL — ABNORMAL LOW (ref 13.0–17.0)
MCH: 30.1 pg (ref 26.0–34.0)
MCHC: 33.8 g/dL (ref 30.0–36.0)
MCV: 89.3 fL (ref 80.0–100.0)
Platelets: 218 10*3/uL (ref 150–400)
RBC: 3.45 MIL/uL — ABNORMAL LOW (ref 4.22–5.81)
RDW: 16.6 % — ABNORMAL HIGH (ref 11.5–15.5)
WBC: 12.7 10*3/uL — ABNORMAL HIGH (ref 4.0–10.5)
nRBC: 0 % (ref 0.0–0.2)

## 2019-04-23 LAB — COOXEMETRY PANEL
Carboxyhemoglobin: 2.6 % — ABNORMAL HIGH (ref 0.5–1.5)
Methemoglobin: 1.2 % (ref 0.0–1.5)
O2 Saturation: 59.1 %
Total hemoglobin: 11.2 g/dL — ABNORMAL LOW (ref 12.0–16.0)

## 2019-04-23 NOTE — Plan of Care (Signed)

## 2019-04-23 NOTE — Plan of Care (Signed)

## 2019-04-23 NOTE — Progress Notes (Signed)
Advanced Heart Failure Rounding Note   Subjective:    On milrinone at 0.25. CO-OX 59%.  Received IVFs yesterday for BP in 70s. Feels better. No dizziness, CP or SOB. CVP 5   Objective:   Weight Range:  Vital Signs:   Temp:  [97.5 F (36.4 C)-97.9 F (36.6 C)] 97.9 F (36.6 C) (08/25 0800) Pulse Rate:  [86-99] 94 (08/25 0800) Resp:  [17-27] 26 (08/25 0800) BP: (73-110)/(42-59) 101/59 (08/25 0800) SpO2:  [92 %-96 %] 96 % (08/25 0800) Weight:  [112.9 kg] 112.9 kg (08/25 0600) Last BM Date: 04/22/19  Weight change: Filed Weights   04/21/19 0320 04/22/19 0309 04/23/19 0600  Weight: 111.7 kg 111.8 kg 112.9 kg    Intake/Output:   Intake/Output Summary (Last 24 hours) at 04/23/2019 0909 Last data filed at 04/23/2019 0807 Gross per 24 hour  Intake 2172.39 ml  Output 1875 ml  Net 297.39 ml     Physical Exam: General:  Well appearing. No resp difficulty HEENT: normal Neck: supple. no JVD. Carotids 2+ bilat; no bruits. No lymphadenopathy or thryomegaly appreciated. Cor: PMI nondisplaced. Regular rate & rhythm. No rubs, gallops or murmurs. Lungs: clear Abdomen: obese soft, nontender, nondistended. No hepatosplenomegaly. No bruits or masses. Good bowel sounds. Extremities: no cyanosis, clubbing, rash, edema Neuro: alert & orientedx3, cranial nerves grossly intact. moves all 4 extremities w/o difficulty. Affect pleasant    Telemetry: NSR 90s Personally reviewed    Labs: Basic Metabolic Panel: Recent Labs  Lab 04/19/19 0219 04/20/19 0415 04/21/19 0546 04/21/19 1107 04/22/19 0411 04/23/19 0415  NA 128* 129* 127*  --  126* 125*  K 4.1 4.3 4.4  --  4.5 4.5  CL 90* 91* 90*  --  89* 92*  CO2 27 28 27   --  27 25  GLUCOSE 241* 191* 206*  --  198* 209*  BUN 11 10 12   --  15 22  CREATININE 0.87 0.94 0.90 1.00 1.09 1.35*  CALCIUM 9.0 9.5 9.6  --  9.5 8.9    Liver Function Tests: Recent Labs  Lab 04/19/19 0219 04/20/19 0415 04/21/19 0546 04/22/19 0411  04/23/19 0415  AST 54* 73* 76* 64* 61*  ALT 45* 56* 64* 61* 56*  ALKPHOS 101 129* 144* 144* 154*  BILITOT 1.9* 1.8* 2.1* 2.5* 2.1*  PROT 5.1* 5.6* 5.5* 5.3* 4.8*  ALBUMIN 2.6* 2.6* 2.5* 2.4* 2.3*   No results for input(s): LIPASE, AMYLASE in the last 168 hours. No results for input(s): AMMONIA in the last 168 hours.  CBC: Recent Labs  Lab 04/20/19 0415 04/21/19 0546 04/21/19 1107 04/22/19 0411 04/23/19 0415  WBC 14.0* 12.3* 12.3* 12.6* 12.7*  HGB 9.8* 10.3* 10.9* 10.3* 10.4*  HCT 29.3* 31.7* 33.0* 32.3* 30.8*  MCV 88.3 91.1 89.9 90.5 89.3  PLT 272 236 249 245 218    Cardiac Enzymes: No results for input(s): CKTOTAL, CKMB, CKMBINDEX, TROPONINI in the last 168 hours.  BNP: BNP (last 3 results) Recent Labs    04/12/19 1647  BNP 359.3*    ProBNP (last 3 results) No results for input(s): PROBNP in the last 8760 hours.    Other results:  Imaging: No results found.   Medications:     Scheduled Medications: . sodium chloride   Intravenous Once  . aspirin  81 mg Oral Daily  . Chlorhexidine Gluconate Cloth  6 each Topical Daily  . citalopram  10 mg Oral Daily  . digoxin  0.125 mg Oral Daily  . enoxaparin (LOVENOX) injection  40 mg Subcutaneous Q24H  . finasteride  5 mg Oral Daily  . insulin aspart  0-20 Units Subcutaneous TID WC  . insulin aspart  0-5 Units Subcutaneous QHS  . insulin aspart  6 Units Subcutaneous TID WC  . insulin glargine  5 Units Subcutaneous Daily  . levofloxacin  750 mg Oral Daily  . lidocaine  2 patch Transdermal Q24H  . mouth rinse  15 mL Mouth Rinse BID  . pantoprazole  40 mg Oral q morning - 10a  . polyethylene glycol  17 g Oral BID  . rosuvastatin  40 mg Oral q1800  . senna-docusate  2 tablet Oral BID  . sodium chloride flush  10-40 mL Intracatheter Q12H  . sodium chloride flush  3 mL Intravenous Q12H  . spironolactone  25 mg Oral Daily    Infusions: . sodium chloride    . milrinone 0.25 mcg/kg/min (04/22/19 2032)    PRN  Medications: sodium chloride, acetaminophen, bisacodyl, ipratropium-albuterol, lactulose, ondansetron (ZOFRAN) IV, sodium chloride flush, sodium chloride flush, traMADol   Assessment/Plam:   1. Acute Hypoxic Respiratory Failure in the setting of PNA - Intubated on admit and later extubated 8/15.  - much improved. - Treated with azithromycin/ceftriaxone -> CT with persistent dense airspace disease. Has complete course of levaquin.  - Now on 2 liters oxygen with stable O2 sats  - Continue aggressive pulmonary toilet  - ambulation, flutter valve and IS   2. 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  - CT surgery consulted for possible CABG. On ASA + crestor. - Heparin stopped 8/20 with RP bleed. No further angina. No need to restart heparin  - Plan for CABG on Friday with Dr. Cyndia Bent. Continue to ambualte  3. Acute Systolic HF, ICM - EF 0000000. RV OK  RHC with CI 1.6 CO 3.7.  - Volume status low. Received IVF 8/24.  - Continue to hold lasix - Continue milrinone 0.25 mcg. CO-OX 59%.  - Continue digoxin 0.25 mg - Continue spiro to 25 mg daily. - Hold carvedilol for now with need for intoropes - Entresto stopped due to low BP. Can start losartan 12.5 qhs as BP tolerated  4. Diabetes - HgBa1c 7.0  5. Arthritis pain Per Primary.     6. Anemia in the setting or RP bleed -CT showed mod-large RP hematoma at [place] site - 8/20 Given 2 UPRBRCs  Hgb trending u0p. 7.3>8.8 -> 9.8 -> 10.3-> 10.3 - Tolerating SQ heparin at DVT dose  7. Hyponatremia.  -Sodium 12e (worse today). Continue to restrict free water. Follow BMET daily. - Start demeclocycline  8. Acute delirium - resolved   9. Elevated LFTs and Bili - Liver normal on ab CT earlier this admission. Will follow      Length of Stay: 11   Glori Bickers MD 04/23/2019, 9:09 AM  Advanced Heart Failure Team Pager [phone]  (M-F; [place])  Please contact [facility] for night-coverage after hours (4p -7a ) and weekends on [email or web address]

## 2019-04-23 NOTE — Progress Notes (Signed)
[facility]  PROGRESS NOTE  Cameron Daniel [id number] DOB: Feb 23, 1946 DOA: 04/12/2019 PCP: [name], MD  Brief History    Cameron Daniel is a 73 y.o. year old male with medical history significant for CAD, HTN, DM, Colon polyps, IBS, HLD, GERD with esophageal stricture, Diverticulosis, Depression, Anxiety former smoker  who presented on 04/12/2019 to [facility] long hospital with increased shortness of breath, cough and chills and was found to have acute hypoxic respiratory failure secondary to right lower lobe pneumonia requiring intubation due to worsening hypoxia/respiratory distress on 8/14.  Extubated on 8/15 but due to significant respiratory distress needed BiPAP.  After extubation patient again decompensated requiring BiPAP due to worsening respiratory symptoms, prompting further evaluation by TTE which showed reduced ejection fraction with wall motion on right heart cath/left heart cath on 8/18 found to have severe three-vessel disease elevated PCWP, cardiac index of 1.6.  At that time advanced heart failure was consulted to optimize for CABG.  Course complicated by retroperitoneal hematoma found on 8/20 during evaluation of persistent right groin pain and acute 2 g drop in hemoglobin.  Patient received 2 units packed red blood cells on 8/28 hemoglobin has improved 8.8 from 7.3 in the last 24 hours.  A & P    Hypotension.  Systolics in the [id number] to 0000000 overnight requiring fluid resuscitation.  Patient remained asymptomatic.  No concern for bleeding, patient's hemoglobin is stable.  Remains afebrile, asymptomatic, stable leukocytosis, doubt infection.  Possibly related to diminished cardiac output?  BP has improved with fluids currently 107/56.  Closely monitor. Entresto discontinued due to low BP.   Retroperitoneal hematoma, stable. Acute on chronic anemia, improving.  After 2 unit blood transfusion this admission hemoglobin continues to improve from 7.3-8.8--10.9--10.3,  remains clinically stable.  Continue to closely monitor.   CT abdomen showed moderate to large sized retroperitoneal hematoma.  Off heparin gtt x 48 hours, tolerating subcut lovenox  Acute  Hyponatremia, continues to worsen.    Given worsening hypotension overnight and concern for being a bit dry on yesterday prompting holding of diuresis I wonder if this related to low volume status in the setting of overdiuresis given elevated urine sodium.  Unfortunately sodium has continued to worsen with fluid restriction for presumed hyponatremia related to CHF/diminished cardiac output..  AHF starting demclocycline  Hallucinations,resolved.  Alert and oriented to person, place, time, context.  Was likely delirium related to hospital stay as well as acute retroperitoneal hematoma.    Leukocytosis, improving.  Likely reactive leukocytosis in the setting of acute bleed mentioned above, now downtrending from 20-16--12.6 today, continue to monitor.  Patient remains afebrile, blood cultures if spikes fever, continue to closely monitor, no new medications likely contributing.   Acute hypoxic respiratory failure, multifactorial secondary to community-acquired pneumonia and CHF with reduced EF, Completed 5-day course of azithromycin and ceftriaxone.  CT chest on 8/20 shows persistent  right-sided lower lobe consolidation from admission ( do not expect resolution of radiographic findings for several weeks), cough is stable, currently on room air with normal oxygen saturation, complete additional 5 days of Levaquin to complete total 10 day course of antibiotics. continue goal-directed medical therapy for CHF addressed below. Encouraged IS/flutter valve.  NSTEMI Severe three-vessel disease/CAD.   high-sensitivity troponin trend 136--150--1963. Right/left heart cath on 8/18 shows low cardiac output as well as severe three-vessel disease.  Needs further optimization of cardiac index/output currently on milrinone as directed by  advanced heart failure team. Hopeful for CABG next week as  he progresses clinically, continue aspirin, no need to resume heparin gtt per cardiology, CTS is following  Acute CHF ischemic cardiomyopathy with reduced EF exacerbation.   EF of 25% on TTE with wall motion abnormalities, Per AHF hold diuresis given euvolemia and looks a bit dry, continue milrinone gtt. continue digoxin, holding Coreg while on milrinone, spironolactone, low dose entresto d/c'd due to low BP, starting losartan. follow co-oximetry, monitor ins and outs, daily cMP,   Transaminitis stable AST and ALT in the 70s and 60s respectively, with abdominal pain no focal abnormalities on liver/biliary exam on CT abdomen on admission.  Daily CMP  AKI, .  Creatinine bumped 1.35 from 1 in the setting of hypotensive BPs overnight.  Status post bolus fluid cessation overnight.  May warrant maintenance fluids unclear given patient's CHF/low cardiac output.  Daily BMPs, avoid nephrotoxins  Depression, stable continue Celexa  Type 2 diabetes, well controlled, A1c 7 (02/15/2019) FBG > 200, holding home meds ,  Lantus 5 U started in hospital,  scheduled Novolog 6 U with meals, appreciate diabetes coordinator recommendations,closely monitor CBG, sliding scale as needed  BPH, stable continue Proscar  GERD, stable.  Continue PPI  Hyperlipidemia, stable continue simvastatin  Chronic right hip pain.  Continue supportive care with Tylenol, avoid NSAIDs given CAD   DVT prophylaxis: Start enoxaparin now that hgb stable Code Status: Full code Family Communication: No family at bedside Disposition Plan: Continue milrinone drip to improve cardiac index,  Hold lasix, Levaquin to complete today, monitor kidney function, na, BP, possible CABG next week as he improves clinically,    [facility] Direct contact: see [email or web address] (further directions at bottom of note if needed) 7PM-7AM contact night coverage as at bottom of note 04/23/2019, 7:50 AM   LOS: 11 days   Consultants   PCCM, cardiology, CT surgery, advanced heart failure  Procedures  TTE; 04/13/2019   1. The left ventricle has severely reduced systolic function, with an ejection fraction of 25-30%. The cavity size was normal. There is mildly increased left ventricular wall thickness. Left ventricular diastolic Doppler parameters are indeterminate. 2. Severe hypokinesis of the left ventricular, entire inferior wall and anteroseptal wall. 3. Left atrial size was mildly dilated. 4. Small pericardial effusion. 5. The pericardial effusion is posterior to the left ventricle. 6. The mitral valve is abnormal. Mild thickening of the mitral valve leaflet. There is mild to moderate mitral annular calcification present. 7. The aortic valve is tricuspid. Mild sclerosis of the aortic valve. No stenosis of the aortic valve. 8. The aorta is normal unless otherwise noted. 9. The inferior vena cava was dilated in size with <50% respiratory variability. 10. The interatrial septum was not assessed.    Antibiotics   Levaquin  Ceftriaxone 8/14-8/18  Azithromycin 8/14-8/18  Zosyn 8/14  Interval History/Subjective  No cough. Feels very well. Wants to have a BM  Objective   Vitals:  Vitals:   04/23/19 0215 04/23/19 0300  BP: (!) 98/57 (!) 107/56  Pulse: 87 86  Resp: 18 (!) 24  Temp:    SpO2: 92% 96%    Exam:  Awake Alert, Oriented X 3, No new F.N deficits, Normal affect [place].AT, Supple Neck, no appreciable JVD,  no peripheral edema Symmetrical Chest wall movement, Good air movement bilaterally on room air RRR,No Gallops,Rubs or new Murmurs, +ve B.Sounds, Abd Soft, No tenderness,  , No rebound - guarding or rigidity. Bruising in right lower abdomen   I have personally reviewed the following:   Data Reviewed: Basic  Metabolic Panel: Recent Labs  Lab 04/19/19 0219 04/20/19 0415 04/21/19 0546 04/21/19 1107 04/22/19 0411 04/23/19 0415  NA 128* 129* 127*   --  126* 125*  K 4.1 4.3 4.4  --  4.5 4.5  CL 90* 91* 90*  --  89* 92*  CO2 27 28 27   --  27 25  GLUCOSE 241* 191* 206*  --  198* 209*  BUN 11 10 12   --  15 22  CREATININE 0.87 0.94 0.90 1.00 1.09 1.35*  CALCIUM 9.0 9.5 9.6  --  9.5 8.9   Liver Function Tests: Recent Labs  Lab 04/19/19 0219 04/20/19 0415 04/21/19 0546 04/22/19 0411 04/23/19 0415  AST 54* 73* 76* 64* 61*  ALT 45* 56* 64* 61* 56*  ALKPHOS 101 129* 144* 144* 154*  BILITOT 1.9* 1.8* 2.1* 2.5* 2.1*  PROT 5.1* 5.6* 5.5* 5.3* 4.8*  ALBUMIN 2.6* 2.6* 2.5* 2.4* 2.3*   No results for input(s): LIPASE, AMYLASE in the last 168 hours. No results for input(s): AMMONIA in the last 168 hours. CBC: Recent Labs  Lab 04/20/19 0415 04/21/19 0546 04/21/19 1107 04/22/19 0411 04/23/19 0415  WBC 14.0* 12.3* 12.3* 12.6* 12.7*  HGB 9.8* 10.3* 10.9* 10.3* 10.4*  HCT 29.3* 31.7* 33.0* 32.3* 30.8*  MCV 88.3 91.1 89.9 90.5 89.3  PLT 272 236 249 245 218   Cardiac Enzymes: No results for input(s): CKTOTAL, CKMB, CKMBINDEX, TROPONINI in the last 168 hours. BNP (last 3 results) Recent Labs    04/12/19 1647  BNP 359.3*    ProBNP (last 3 results) No results for input(s): PROBNP in the last 8760 hours.  CBG: Recent Labs  Lab 04/22/19 0632 04/22/19 1122 04/22/19 1549 04/22/19 2113 04/23/19 0632  GLUCAP 182* 239* 264* 236* 182*    Recent Results (from the past 240 hour(s))  MRSA PCR Screening     Status: None   Collection Time: 04/13/19  6:21 PM   Specimen: Nasopharyngeal  Result Value Ref Range Status   MRSA by PCR NEGATIVE NEGATIVE Final    Comment:        The GeneXpert MRSA Assay (FDA approved for NASAL specimens only), is one component of a comprehensive MRSA colonization surveillance program. It is not intended to diagnose MRSA infection nor to guide or monitor treatment for MRSA infections. Performed at [facility], [place] [address] [place], [place] [address]      Studies: No results  found.  Scheduled Meds:  sodium chloride   Intravenous Once   aspirin  81 mg Oral Daily   Chlorhexidine Gluconate Cloth  6 each Topical Daily   citalopram  10 mg Oral Daily   digoxin  0.125 mg Oral Daily   enoxaparin (LOVENOX) injection  40 mg Subcutaneous Q24H   finasteride  5 mg Oral Daily   insulin aspart  0-20 Units Subcutaneous TID WC   insulin aspart  0-5 Units Subcutaneous QHS   insulin aspart  6 Units Subcutaneous TID WC   insulin glargine  5 Units Subcutaneous Daily   levofloxacin  750 mg Oral Daily   lidocaine  2 patch Transdermal Q24H   mouth rinse  15 mL Mouth Rinse BID   pantoprazole  40 mg Oral q morning - 10a   polyethylene glycol  17 g Oral BID   rosuvastatin  40 mg Oral q1800   senna-docusate  2 tablet Oral BID   sodium chloride flush  10-40 mL Intracatheter Q12H   sodium chloride flush  3 mL Intravenous  Q12H   spironolactone  25 mg Oral Daily   Continuous Infusions:  sodium chloride     milrinone 0.25 mcg/kg/min (04/22/19 2032)    Principal Problem:   Non-ST elevation (NSTEMI) myocardial infarction [facility]) Active Problems:   Type 2 diabetes mellitus with hyperlipidemia (HCC)   Coronary atherosclerosis   GERD   Community acquired pneumonia of right lower lobe of lung ([place])   Acute respiratory failure (HCC)   AKI (acute kidney injury) ([place])   Acute systolic HF (heart failure) (HCC)   Retroperitoneal hematoma   Acute on chronic blood loss anemia   Hyponatremia      Desiree Hane  [facility]

## 2019-04-23 NOTE — Progress Notes (Signed)
Inpatient Diabetes Program Recommendations  AACE/ADA: New Consensus Statement on Inpatient Glycemic Control   Target Ranges:  Prepandial:   less than 140 mg/dL      Peak postprandial:   less than 180 mg/dL (1-2 hours)      Critically ill patients:  140 - 180 mg/dL     Review of Glycemic Control Results for Cameron Daniel, Cameron Daniel (MRN [id number]) as of 04/23/2019 10:02  Ref. Range 04/22/2019 06:32 04/22/2019 11:22 04/22/2019 15:49 04/22/2019 21:13 04/23/2019 06:32  Glucose-Capillary Latest Ref Range: 70 - 99 mg/dL 182 (H) 239 (H) 264 (H) 236 (H) 182 (H)   Diabetes history:DM2 Outpatient Diabetes medications:Glipizide 5 mg QAM, Actos 30 mg daily Current orders for Inpatient glycemic control:Lantus 5 units daily, Novolog 0-20 units TID with meals, Novolog 0-5 units QHS, Novolog 6 units TID with meals for meal coverage  Inpatient Diabetes Program Recommendations:  Fasting glucose 180's. Consider increasing Lantus to 7 units.  Noted meal coverage increased yesterday will watch postprandial trends today.  Thanks, Tama Headings RN, MSN, BC-ADM Inpatient Diabetes Coordinator Team Pager [phone] (8a-5p)

## 2019-04-23 NOTE — Progress Notes (Signed)
CARDIAC REHAB PHASE I   Offered to walk with pt. Pt states he has recently returned from a walk with his NT. Pt states he is feeling stronger even when getting up to [facility]. Encouraged continued IS and Flutter use. Pts wife stating pts son is not handling the news of his father needing surgery well and requesting that the son be able to visit. Staff made aware.  [id number] [name], RN BSN 04/23/2019 2:42 PM

## 2019-04-23 NOTE — Progress Notes (Signed)
Patient resting comfortably on room air, BIPAP not needed at this time. Will continue to monitor patient.

## 2019-04-24 LAB — GLUCOSE, CAPILLARY
Glucose-Capillary: 162 mg/dL — ABNORMAL HIGH (ref 70–99)
Glucose-Capillary: 167 mg/dL — ABNORMAL HIGH (ref 70–99)
Glucose-Capillary: 203 mg/dL — ABNORMAL HIGH (ref 70–99)
Glucose-Capillary: 153 mg/dL — ABNORMAL HIGH (ref 70–99)

## 2019-04-24 LAB — COMPREHENSIVE METABOLIC PANEL
ALT: 64 U/L — ABNORMAL HIGH (ref 0–44)
AST: 72 U/L — ABNORMAL HIGH (ref 15–41)
Albumin: 2.5 g/dL — ABNORMAL LOW (ref 3.5–5.0)
Alkaline Phosphatase: 170 U/L — ABNORMAL HIGH (ref 38–126)
Anion gap: 9 (ref 5–15)
BUN: 16 mg/dL (ref 8–23)
CO2: 26 mmol/L (ref 22–32)
Calcium: 9.7 mg/dL (ref 8.9–10.3)
Chloride: 93 mmol/L — ABNORMAL LOW (ref 98–111)
Creatinine, Ser: 1.15 mg/dL (ref 0.61–1.24)
GFR calc Af Amer: >60 mL/min (ref >60)
GFR calc non Af Amer: >60 mL/min (ref >60)
Glucose, Bld: 166 mg/dL — ABNORMAL HIGH (ref 70–99)
Potassium: 4.8 mmol/L (ref 3.5–5.1)
Sodium: 128 mmol/L — ABNORMAL LOW (ref 135–145)
Total Bilirubin: 2.2 mg/dL — ABNORMAL HIGH (ref 0.3–1.2)
Total Protein: 5.5 g/dL — ABNORMAL LOW (ref 6.5–8.1)

## 2019-04-24 LAB — CBC
HCT: 32.7 % — ABNORMAL LOW (ref 39.0–52.0)
Hemoglobin: 10.8 g/dL — ABNORMAL LOW (ref 13.0–17.0)
MCH: 29.8 pg (ref 26.0–34.0)
MCHC: 33 g/dL (ref 30.0–36.0)
MCV: 90.3 fL (ref 80.0–100.0)
Platelets: 268 10*3/uL (ref 150–400)
RBC: 3.62 MIL/uL — ABNORMAL LOW (ref 4.22–5.81)
RDW: 16.8 % — ABNORMAL HIGH (ref 11.5–15.5)
WBC: 10.7 10*3/uL — ABNORMAL HIGH (ref 4.0–10.5)
nRBC: 0 % (ref 0.0–0.2)

## 2019-04-24 LAB — COOXEMETRY PANEL
Carboxyhemoglobin: 2.2 % — ABNORMAL HIGH (ref 0.5–1.5)
Methemoglobin: 0.7 % (ref 0.0–1.5)
O2 Saturation: 56.3 %
Total hemoglobin: 12.4 g/dL (ref 12.0–16.0)

## 2019-04-24 MED ORDER — LOSARTAN POTASSIUM 25 MG PO TABS
12.5000 mg | ORAL_TABLET | Freq: Every day | ORAL | Status: DC
  Administered 2019-04-24 – 2019-04-25 (×2): 12.5 mg via ORAL
  Filled 2019-04-24 (×2): qty 1

## 2019-04-24 MED ORDER — INSULIN GLARGINE 100 UNIT/ML ~~LOC~~ SOLN
7.0000 [IU] | Freq: Every day | SUBCUTANEOUS | Status: DC
  Administered 2019-04-24 – 2019-04-25 (×2): 7 [IU] via SUBCUTANEOUS
  Filled 2019-04-24 (×3): qty 0.07

## 2019-04-24 NOTE — Progress Notes (Signed)
[facility]  PROGRESS NOTE  Cameron Daniel [id number] DOB: 1946/04/17 DOA: 04/12/2019 PCP: [name], MD  Brief History    Cameron Daniel is a 73 y.o. year old male with medical history significant for CAD, HTN, DM, Colon polyps, IBS, HLD, GERD with esophageal stricture, Diverticulosis, Depression, Anxiety former smoker  who presented on 04/12/2019 to [facility] with increased shortness of breath, cough and chills and was found to have acute hypoxic respiratory failure secondary to right lower lobe pneumonia requiring intubation due to worsening hypoxia/respiratory distress on 8/14.  Extubated on 8/15 but due to significant respiratory distress needed BiPAP.  After extubation patient again decompensated requiring BiPAP due to worsening respiratory symptoms, prompting further evaluation by TTE which showed reduced ejection fraction with wall motion on right heart cath/left heart cath on 8/18 found to have severe three-vessel disease elevated PCWP, cardiac index of 1.6.  At that time advanced heart failure was consulted to optimize for CABG.  Course complicated by retroperitoneal hematoma found on 8/20 during evaluation of persistent right groin pain and acute 2 g drop in hemoglobin.  Patient received 2 units packed red blood cells on 8/28 hemoglobin has improved 8.8 from 7.3 in the last 24 hours.   Subjective    The patient was seen and examined this morning, stable no acute distress.  Denies any shortness of breath, or chest pain. No major events overnight. Did not need a BiPAP overnight.    Assessment and plan    Hypotension.  -Blood pressure remained stable, this morning 116/75   Patient remained asymptomatic.   No concern for bleeding, patient's hemoglobin is stable.  BP has improved with fluids  Closely monitor. Entresto discontinued due to low BP.   Retroperitoneal hematoma, stable. Acute on chronic anemia, improving.    -Status post  transfusion with 2 [place] PRBC, H&H monitoring remained to be stable -hemoglobin continues to improve from 7.3-8.8--10.9--10.3 >> 10.8  Continue to closely monitor.    CT abdomen showed moderate to large sized retroperitoneal hematoma.   Off heparin gtt x 48 hours, tolerating subcut lovenox  Acute  Hyponatremia, continues to worsen.    -Improving -Sodium levels 126-125-128 today. -Concerning for over diuresing, mild dehydration -sodium has continued to worsen with fluid restriction for presumed hyponatremia related to CHF/diminished cardiac output..  AHF started demclocycline  Hallucinations,resolved.   -Likely delirium, currently stable, improved Alert and oriented to person, place, time,   Leukocytosis, improving. -Likely reactive, due to acute bleeding, no signs of infection - continue to monitor.   -Patient remains afebrile, blood cultures if spikes fever, continue to closely monitor, no new medications likely contributing. -Completed 5 days course of antibiotic azithromycin/Rocephin, and 5 days of Levaquin total of 10 days of antibiotic coverage   Acute hypoxic respiratory failure, multifactorial secondary to community-acquired pneumonia and CHF with reduced EF,  -Remained stable improved shortness of breath, status post antibiotic treatment x5 days as it was/Rocephin  -  CT chest on 8/20 shows persistent  right-sided lower lobe consolidation from admission ( do not expect resolution of radiographic findings for several weeks), cough is stable, currently on room air with normal oxygen saturation, - complete additional 5 days of Levaquin to complete total 10 day course of antibiotics.  -continue goal-directed medical therapy for CHF addressed below. - Encouraged IS/flutter valve.  NSTEMI Severe three-vessel disease/CAD.  Stable this morning, denies any chest pain - high-sensitivity troponin trend 136--150--1963.  -Right/left heart cath on 8/18 shows low cardiac output  as well as  severe three-vessel disease. - Needs further optimization of cardiac index/output currently on milrinone as directed by advanced heart failure team.  -Continue medical management -Cardiology/CTS following pending CABG next week. -Continue aspirin,  no need to resume heparin gtt per cardiology, CTS is following  Acute CHF ischemic cardiomyopathy with reduced EF exacerbation.    -Stable, improved shortness of breath EF of 25% on TTE with wall motion abnormalities, Per AHF hold diuresis given euvolemia and looks a bit dry, continue milrinone gtt. continue digoxin, holding Coreg while on milrinone, spironolactone, low dose entresto d/c'd due to low BP, starting losartan. follow co-oximetry, monitor ins and outs, daily cMP,   Transaminitis stable -LFTs continue to improve  AST and ALT in the 70s and 60s respectively, with abdominal pain no focal abnormalities on liver/biliary exam on CT abdomen on admission.    AKI, . -Improving, -Monitor BUN/creatinine  -Creatinine bumped 1.35 >> 1.15 Likely due to HF/low cardiac output.  Brief hypotension, -=avoid nephrotoxins  Depression,  stable continue Celexa  Type 2 diabetes, well controlled, A1c 7 (02/15/2019)  -Continue CBG QA CHS, SSI -Increasing Lantus from 5 to 7 units today. -FBG > 200, holding home meds ,    BPH, stable continue Proscar  GERD, stable.  Continue PPI  Hyperlipidemia, stable continue simvastatin  Chronic right hip pain.  Continue supportive care with Tylenol, avoid NSAIDs given CAD   DVT prophylaxis: Start enoxaparin now that hgb stable Code Status: Full code Family Communication: No family at bedside Disposition Plan: Continue milrinone drip to improve cardiac index,  Hold lasix, Levaquin to complete today, monitor kidney function, na, BP, possible CABG next week as he improves clinically,     04/24/2019, 10:18 AM  LOS: 12 days   Consultants  . PCCM, cardiology, CT surgery, advanced heart failure  Procedures   TTE; 04/13/2019   1. The left ventricle has severely reduced systolic function, with an ejection fraction of 25-30%. The cavity size was normal. There is mildly increased left ventricular wall thickness. Left ventricular diastolic Doppler parameters are indeterminate. 2. Severe hypokinesis of the left ventricular, entire inferior wall and anteroseptal wall. 3. Left atrial size was mildly dilated. 4. Small pericardial effusion. 5. The pericardial effusion is posterior to the left ventricle. 6. The mitral valve is abnormal. Mild thickening of the mitral valve leaflet. There is mild to moderate mitral annular calcification present. 7. The aortic valve is tricuspid. Mild sclerosis of the aortic valve. No stenosis of the aortic valve. 8. The aorta is normal unless otherwise noted. 9. The inferior vena cava was dilated in size with <50% respiratory variability. 10. The interatrial septum was not assessed.    Antibiotics  . Levaquin . Ceftriaxone 8/14-8/18 . Azithromycin 8/14-8/18 . Zosyn 8/14  Interval History/Subjective  No cough. Feels very well. Wants to have a BM  Objective   Vitals:  Vitals:   04/24/19 0600 04/24/19 0746  BP: (!) 112/57 116/75  Pulse: 85 93  Resp:  18  Temp:  97.8 F (36.6 C)  SpO2: 95% 96%    Exam:  Awake Alert, Oriented X 3, No new F.N deficits, Normal affect [place].AT, Supple Neck, no appreciable JVD,  no peripheral edema Symmetrical Chest wall movement, Good air movement bilaterally on room air RRR,No Gallops,Rubs or new Murmurs, +ve B.Sounds, Abd Soft, No tenderness,  , No rebound - guarding or rigidity. Bruising in right lower abdomen   I have personally reviewed the following:   Data Reviewed: Basic Metabolic Panel:  Recent Labs  Lab 04/20/19 0415 04/21/19 0546 04/21/19 1107 04/22/19 0411 04/23/19 0415 04/24/19 0334  NA 129* 127*  --  126* 125* 128*  K 4.3 4.4  --  4.5 4.5 4.8  CL 91* 90*  --  89* 92* 93*  CO2 28 27  --  27  25 26   GLUCOSE 191* 206*  --  198* 209* 166*  BUN 10 12  --  15 22 16   CREATININE 0.94 0.90 1.00 1.09 1.35* 1.15  CALCIUM 9.5 9.6  --  9.5 8.9 9.7   Liver Function Tests: Recent Labs  Lab 04/20/19 0415 04/21/19 0546 04/22/19 0411 04/23/19 0415 04/24/19 0334  AST 73* 76* 64* 61* 72*  ALT 56* 64* 61* 56* 64*  ALKPHOS 129* 144* 144* 154* 170*  BILITOT 1.8* 2.1* 2.5* 2.1* 2.2*  PROT 5.6* 5.5* 5.3* 4.8* 5.5*  ALBUMIN 2.6* 2.5* 2.4* 2.3* 2.5*   No results for input(s): LIPASE, AMYLASE in the last 168 hours. No results for input(s): AMMONIA in the last 168 hours. CBC: Recent Labs  Lab 04/21/19 0546 04/21/19 1107 04/22/19 0411 04/23/19 0415 04/24/19 0334  WBC 12.3* 12.3* 12.6* 12.7* 10.7*  HGB 10.3* 10.9* 10.3* 10.4* 10.8*  HCT 31.7* 33.0* 32.3* 30.8* 32.7*  MCV 91.1 89.9 90.5 89.3 90.3  PLT 236 249 245 218 268   Cardiac Enzymes: No results for input(s): CKTOTAL, CKMB, CKMBINDEX, TROPONINI in the last 168 hours. BNP (last 3 results) Recent Labs    04/12/19 1647  BNP 359.3*    ProBNP (last 3 results) No results for input(s): PROBNP in the last 8760 hours.  CBG: Recent Labs  Lab 04/23/19 0632 04/23/19 1122 04/23/19 1625 04/23/19 2103 04/24/19 0646  GLUCAP 182* 221* 161* 162* 162*    No results found for this or any previous visit (from the past 240 hour(s)).   Studies: No results found.  Scheduled Meds: . sodium chloride   Intravenous Once  . aspirin  81 mg Oral Daily  . Chlorhexidine Gluconate Cloth  6 each Topical Daily  . citalopram  10 mg Oral Daily  . digoxin  0.125 mg Oral Daily  . enoxaparin (LOVENOX) injection  40 mg Subcutaneous Q24H  . finasteride  5 mg Oral Daily  . insulin aspart  0-20 Units Subcutaneous TID WC  . insulin aspart  0-5 Units Subcutaneous QHS  . insulin aspart  6 Units Subcutaneous TID WC  . insulin glargine  7 Units Subcutaneous Daily  . lidocaine  2 patch Transdermal Q24H  . mouth rinse  15 mL Mouth Rinse BID  .  pantoprazole  40 mg Oral q morning - 10a  . polyethylene glycol  17 g Oral BID  . rosuvastatin  40 mg Oral q1800  . senna-docusate  2 tablet Oral BID  . sodium chloride flush  10-40 mL Intracatheter Q12H  . sodium chloride flush  3 mL Intravenous Q12H  . spironolactone  25 mg Oral Daily   Continuous Infusions: . sodium chloride    . milrinone 0.25 mcg/kg/min (04/24/19 0804)      SIGNED: Deatra James, MD, FACP, FHM. [facility],  Pager [phone]  If 7PM-7AM, please contact night-coverage [email or web address], Password [facility] 04/24/2019, 10:18 AM

## 2019-04-24 NOTE — Progress Notes (Signed)
Physical Therapy Treatment Patient Details Name: Cameron Daniel MRN: [id number] DOB: 11-20-1945 Today's Date: 04/24/2019    History of Present Illness 73yo male with hx CAD, HTN, DM who was seen 8/14 in ED with concerns for dehydration after working outside in the heat. He was given IV fluids and d/c. He returned 8/14 with chills, fatigue, cough, dyspnea.  CT chest with RLL consolidation, mildly elevated troponin.  Had progressive respiratory failure with hypoxia, worsening tachypnea and ultimately required intubation in ER. Admitted 04/12/19 for treatment of NSTEMI and PNA. Intubation ended 04/13/19, Cardiac cath 8/18    PT Comments    Patient progressing with ambulation distance and endurance.  Remains reliant on RW for stability and not wanting to push to walk further.  Still appropriate for skilled PT during acute stay as planned CABG on Friday and hopeful for home with family support and HHPT after that.    Follow Up Recommendations  Home health PT;Supervision for mobility/OOB     Equipment Recommendations  Rolling walker with 5" wheels    Recommendations for Other Services       Precautions / Restrictions Precautions Precautions: Fall    Mobility  Bed Mobility Overal bed mobility: Needs Assistance       Supine to sit: Supervision     General bed mobility comments: increased time, use of rails, heavy UE assist  Transfers Overall transfer level: Needs assistance Equipment used: Rolling walker (2 wheeled) Transfers: Sit to/from Stand Sit to Stand: Min guard         General transfer comment: for balance once rising as pushing hard up from EOB  Ambulation/Gait Ambulation/Gait assistance: Min guard;Supervision Gait Distance (Feet): 140 Feet Assistive device: Rolling walker (2 wheeled) Gait Pattern/deviations: Step-through pattern;Decreased stride length     General Gait Details: VSS with ambulation, assist for safety/balance, IV pole   Stairs              Wheelchair Mobility    Modified Rankin (Stroke Patients Only)       Balance Overall balance assessment: Needs assistance   Sitting balance-Leahy Scale: Good     Standing balance support: Single extremity supported;Bilateral upper extremity supported Standing balance-Leahy Scale: Poor Standing balance comment: UE support                            Cognition Arousal/Alertness: Awake/alert Behavior During Therapy: WFL for tasks assessed/performed Overall Cognitive Status: Within Functional Limits for tasks assessed                                        Exercises      General Comments        Pertinent Vitals/Pain Pain Assessment: No/denies pain    Home Living                      Prior Function            PT Goals (current goals can now be found in the care plan section) Progress towards PT goals: Progressing toward goals    Frequency    Min 3X/week      PT Plan Current plan remains appropriate    Co-evaluation              AM-PAC PT "6 Clicks" Mobility   Outcome Measure  Help needed turning from your back  to your side while in a flat bed without using bedrails?: None Help needed moving from lying on your back to sitting on the side of a flat bed without using bedrails?: A Little Help needed moving to and from a bed to a chair (including a wheelchair)?: A Little Help needed standing up from a chair using your arms (e.g., wheelchair or bedside chair)?: A Little Help needed to walk in hospital room?: A Little Help needed climbing 3-5 steps with a railing? : A Little 6 Click Score: 19    End of Session   Activity Tolerance: Patient tolerated treatment well Patient left: in chair;with call bell/phone within reach   PT Visit Diagnosis: Other abnormalities of gait and mobility (R26.89);Unsteadiness on feet (R26.81)     Time: [id number] PT Time Calculation (min) (ACUTE ONLY): 18 min  Charges:  $Gait  Training: 8-22 mins                     Magda Kiel, [place] [phone] 04/24/2019    Reginia Naas 04/24/2019, 5:08 PM

## 2019-04-24 NOTE — Progress Notes (Addendum)
Progress Note  [name] Date of Encounter: 04/24/2019  Primary Cardiologist: [name], MD   Subjective   No complaints this morning. Out of bed and sitting in chair. Eating lunch. Denies CP. No dyspnea. Right flank still tender but less pain.   Inpatient Medications    Scheduled Meds:  sodium chloride   Intravenous Once   aspirin  81 mg Oral Daily   Chlorhexidine Gluconate Cloth  6 each Topical Daily   citalopram  10 mg Oral Daily   digoxin  0.125 mg Oral Daily   enoxaparin (LOVENOX) injection  40 mg Subcutaneous Q24H   finasteride  5 mg Oral Daily   insulin aspart  0-20 Units Subcutaneous TID WC   insulin aspart  0-5 Units Subcutaneous QHS   insulin aspart  6 Units Subcutaneous TID WC   insulin glargine  7 Units Subcutaneous Daily   lidocaine  2 patch Transdermal Q24H   mouth rinse  15 mL Mouth Rinse BID   pantoprazole  40 mg Oral q morning - 10a   polyethylene glycol  17 g Oral BID   rosuvastatin  40 mg Oral q1800   senna-docusate  2 tablet Oral BID   sodium chloride flush  10-40 mL Intracatheter Q12H   sodium chloride flush  3 mL Intravenous Q12H   spironolactone  25 mg Oral Daily   Continuous Infusions:  sodium chloride     milrinone 0.25 mcg/kg/min (04/24/19 0804)   PRN Meds: sodium chloride, acetaminophen, bisacodyl, ipratropium-albuterol, lactulose, ondansetron (ZOFRAN) IV, sodium chloride flush, sodium chloride flush, traMADol   Vital Signs    Vitals:   04/24/19 0412 04/24/19 0600 04/24/19 0746 04/24/19 1044  BP: 117/60 (!) 112/57 116/75 115/64  Pulse: 85 85 93 81  Resp:   18 (!) 21  Temp: 98 F (36.7 C)  97.8 F (36.6 C) 98 F (36.7 C)  TempSrc: Oral  Oral Oral  SpO2: 98% 95% 96%   Weight:  110.6 kg    Height:        Intake/Output Summary (Last 24 hours) at 04/24/2019 1140 Last data filed at 04/24/2019 1005 Gross per 24 hour  Intake 985.11 ml  Output 4190 ml  Net -3204.89 ml   Last 3  Weights 04/24/2019 04/23/2019 04/22/2019  Weight (lbs) 243 lb 13.3 oz 248 lb 14.4 oz 246 lb 7.6 oz  Weight (kg) 110.6 kg 112.9 kg 111.8 kg      Telemetry    NSR upper 90s, 3 beat run of NSVT - Personally Reviewed  ECG    No new EKGs to review - Personally Reviewed  Physical Exam   GEN: moderately obese, elderly WM in no acute distress.   Neck: No JVD Cardiac: RRR, no murmurs, rubs, or gallops.  Respiratory: Clear to auscultation bilaterally. GI: right flank hematoma (RPB), Soft, mildly tender, non-distended  MS: No edema; No deformity. Neuro:  Nonfocal  Psych: Normal affect   Labs    High Sensitivity Troponin:   Recent Labs  Lab 04/12/19 1647 04/12/19 1826 04/12/19 2352  TROPONINIHS 136* 150* 1,963*      Chemistry Recent Labs  Lab 04/22/19 0411 04/23/19 0415 04/24/19 0334  NA 126* 125* 128*  K 4.5 4.5 4.8  CL 89* 92* 93*  CO2 _0 GLUCOSE 198* 209* 166*  BUN _1 CREATININE 1.09 1.35* 1.15  CALCIUM 9.5 8.9 9.7  PROT 5.3* 4.8* 5.5*  ALBUMIN 2.4* 2.3* 2.5*  AST 64* 61* 72*  ALT 61* 56* 64*  ALKPHOS 144* 154* 170*  BILITOT 2.5* 2.1* 2.2*  GFRNONAA >60 52* >60  GFRAA >60 >60 >60  ANIONGAP _0 Hematology Recent Labs  Lab 04/22/19 0411 04/23/19 0415 04/24/19 0334  WBC 12.6* 12.7* 10.7*  RBC 3.57* 3.45* 3.62*  HGB 10.3* 10.4* 10.8*  HCT 32.3* 30.8* 32.7*  MCV 90.5 89.3 90.3  MCH 28.9 30.1 29.8  MCHC 31.9 33.8 33.0  RDW 16.7* 16.6* 16.8*  PLT 245 218 268    BNPNo results for input(s): BNP, PROBNP in the last 168 hours.   DDimer No results for input(s): DDIMER in the last 168 hours.   Radiology    No results found.  Cardiac Studies   LHC 04/16/19 Procedures  RIGHT/LEFT HEART CATH AND CORONARY ANGIOGRAPHY  Conclusion   Severe three-vessel coronary artery disease involving the ostial proximal and mid LAD, the mid RCA, and the mid and distal circumflex.  There is moderate but not angiographically significant left main  disease.  Global hypokinesis with reduced EF less than 30% has been noted by echocardiography.  Overall picture is consistent with ischemic cardiomyopathy, possibly worsened EF with stunned myocardium in the setting of severe three-vessel CAD and increased demand caused by pneumonia and intubation.  Hemodynamics suggest that volume status is stable.  Cardiac output is low due to left ventricular hypocontractility.  RECOMMENDATIONS:   Strong consideration for surgical revascularization once medical status is improved.  Guideline directed therapy for left ventricular systolic dysfunction which should also include anti-ischemic therapy.  Preventive therapy with high intensity statin therapy.  Consider advanced heart failure team assessment to help optimize clinical condition.  Debate whether viability imaging is necessary.   Right Heart  Right Heart Pressures Hemodynamic findings consistent with mild pulmonary hypertension. LV EDP is normal.  Wall Motion  Resting    LVEF less than 30% by echo with global hypokinesis noted.        Coronary Diagrams  Diagnostic Dominance: Right  Intervention - awaiting CABG   2D Echo 04/13/19 IMPRESSIONS    1. The left ventricle has severely reduced systolic function, with an ejection fraction of 25-30%. The cavity size was normal. There is mildly increased left ventricular wall thickness. Left ventricular diastolic Doppler parameters are indeterminate.  2. Severe hypokinesis of the left ventricular, entire inferior wall and anteroseptal wall.  3. Left atrial size was mildly dilated.  4. Small pericardial effusion.  5. The pericardial effusion is posterior to the left ventricle.  6. The mitral valve is abnormal. Mild thickening of the mitral valve leaflet. There is mild to moderate mitral annular calcification present.  7. The aortic valve is tricuspid. Mild sclerosis of the aortic valve. No stenosis of the aortic valve.  8. The aorta is  normal unless otherwise noted.  9. The inferior vena cava was dilated in size with <50% respiratory variability. 10. The interatrial septum was not assessed.  SUMMARY   Very technically difficult study, despite definity contrast. LVEF 25-30%, global hypokinesis with severe inferior and anteroseptal hypokinesis, indeterminate diastolic function, elevated LV filling pressure, mild LAE, MAC with mild MR, aortic valve sclerosis, dilated IVC, small posterior pericardial effusion.  Patient Profile     73 y.o. male w/ known CAD s/p MI 30 years ago w/ RCA PCI, who presented initially to [facility] w/ acute hypoxic respiratory failure requiring intubation. CTA c/w RLL PNA. COVID negative. Hs Troponin c/w NSTEMI. 2D echo showed reduced LVEF at 25-30% w/ WMA. Subsequent  LHC showed severe 3V CAD. Post cath recovery complicated by retroperitoneal bleed. IV heparin discontinued. S/p transfusion. Now awaiting CABG.   Assessment & Plan    1. CAD/ NSTEMI: hs Troponin peaked at 1,963. Cardiac cath 04/16/19 showed severe three-vessel coronary artery disease involving the ostial proximal and mid LAD, the mid RCA, and the mid and distal circumflex.  There is moderate but not angiographically significant left main disease. Awaiting CABG, tentatively scheduled for 8/28. Currently CP free. IV heparin discontinued due to RPB. NSR on tele. BP stable. Continue ASA and statin (LDL has been controlled, most recent LDL 59 mg/dL). No  blocker currently due to low output HF, requiring milrinone.    2. Acute Systolic CHF/ Ischemic Cardiomyopathy: euvolemic on exam. CVP normal at 6. Denies dyspnea. Net I/Os negative, -8L. No longer on loop diuretic due to hyponatremia. On spironolactone, 25 mg daily . SCr WNL, 1.15 and K normal at 4.8. He did not tolerate Entresto previously due to hypotension. No  blocker due to low output. Continue digoxin. Continue to monitor volume status. Continue milrinone, 0.25 mcg/kg/min. CO-Ox 56%. 3 beats of  NSVT noted but no other adverse arrhthymias on tele.   3. Retroperitoneal Bleed/ Acute Blood Loss Anemia: post cath complication. Hgb dropped from 9.1>>7.3. Require blood transfusion. Hgb improved and stabilized at 10.8. IV heparin discontinued. Right flank still sore but pt notes subjective improvement w/ less pain.     4. Hyponatremia: Na gradually improving from 126>>128.  Mentating well. Loop diuretics discontinued. Continue to fluid restrict, <1500 mL/daily. Continue to monitor.   5. DM: on insulin. Has been controlled. Most recent hgb A1c was 7.0 in June.   6. Lipids: on statin, Crestor 40 mg nightly.  Last lipid panel in June showed LDL at 59 mg/dL. Despite good lipid control w/ LDL < 70, he still has severe multivessel CAD. Consider more aggressive lipid lowering therapy to drive LDL down to < 50 mg/dL. Can adjust regimen post discharge. Consider later addition of Zetia vs PCSK9 inhibitor.   7. AKI: improved. Scr down from 1.35>>1.15 today.   For questions or updates, please contact [place] Please consult [email or web address] for contact info under      Signed, Lyda Jester, PA-C  04/24/2019, 11:40 AM    Patient seen with PA, agree with the above note.  He remains on milrinone 0.25.  Co-ox 56% with CVP 6 today.  No complaints, awaiting CABG.   Stable exam, no JVD.  He remains in NSR.   He will continue digoxin 0.125 and spironolactone 25.  Continue milrinone 0.25. No Lasix today with CVP 6.  BP stable today, can add losartan 12.5 mg qhs.   Plan for CABG on Friday.   Loralie Champagne 04/24/2019

## 2019-04-24 NOTE — Progress Notes (Signed)
CARDIAC REHAB PHASE I   PRE:  Rate/Rhythm: 90 SR  BP:  Sitting: 90/65      SaO2: 99 RA  MODE:  Ambulation: 150 ft   POST:  Rate/Rhythm: 102 ST  BP:  Sitting: 116/61    SaO2: 97 RA   Pt ambulated 145ft in hallway assist of 1 with front wheel walker. Pt denies CP or SOB. Pt helped into bed. Call bell and phone within reach. Observed pt using Flutter and IS. Encouraged continued use, and continued walking. Will continue to follow.  [id number] [name], RN BSN 04/24/2019 2:56 PM

## 2019-04-25 DIAGNOSIS — I2510 Atherosclerotic heart disease of native coronary artery without angina pectoris: Secondary | ICD-10-CM

## 2019-04-25 LAB — CBC
HCT: 34.4 % — ABNORMAL LOW (ref 39.0–52.0)
Hemoglobin: 11.1 g/dL — ABNORMAL LOW (ref 13.0–17.0)
MCH: 29.5 pg (ref 26.0–34.0)
MCHC: 32.3 g/dL (ref 30.0–36.0)
MCV: 91.5 fL (ref 80.0–100.0)
Platelets: 270 10*3/uL (ref 150–400)
RBC: 3.76 MIL/uL — ABNORMAL LOW (ref 4.22–5.81)
RDW: 16.8 % — ABNORMAL HIGH (ref 11.5–15.5)
WBC: 11.3 10*3/uL — ABNORMAL HIGH (ref 4.0–10.5)
nRBC: 0 % (ref 0.0–0.2)

## 2019-04-25 LAB — BASIC METABOLIC PANEL
Anion gap: 8 (ref 5–15)
BUN: 15 mg/dL (ref 8–23)
CO2: 28 mmol/L (ref 22–32)
Calcium: 9.7 mg/dL (ref 8.9–10.3)
Chloride: 92 mmol/L — ABNORMAL LOW (ref 98–111)
Creatinine, Ser: 1.15 mg/dL (ref 0.61–1.24)
GFR calc Af Amer: >60 mL/min (ref >60)
GFR calc non Af Amer: >60 mL/min (ref >60)
Glucose, Bld: 178 mg/dL — ABNORMAL HIGH (ref 70–99)
Potassium: 4.8 mmol/L (ref 3.5–5.1)
Sodium: 128 mmol/L — ABNORMAL LOW (ref 135–145)

## 2019-04-25 LAB — GLUCOSE, CAPILLARY
Glucose-Capillary: 156 mg/dL — ABNORMAL HIGH (ref 70–99)
Glucose-Capillary: 165 mg/dL — ABNORMAL HIGH (ref 70–99)
Glucose-Capillary: 215 mg/dL — ABNORMAL HIGH (ref 70–99)
Glucose-Capillary: 155 mg/dL — ABNORMAL HIGH (ref 70–99)

## 2019-04-25 LAB — TYPE AND SCREEN
ABO/RH(D): B POS
Antibody Screen: NEGATIVE

## 2019-04-25 LAB — COOXEMETRY PANEL
Carboxyhemoglobin: 2.3 % — ABNORMAL HIGH (ref 0.5–1.5)
Methemoglobin: 0.8 % (ref 0.0–1.5)
O2 Saturation: 55.3 %
Total hemoglobin: 11.1 g/dL — ABNORMAL LOW (ref 12.0–16.0)

## 2019-04-25 MED ORDER — CHLORHEXIDINE GLUCONATE CLOTH 2 % EX PADS
6.0000 | MEDICATED_PAD | Freq: Once | CUTANEOUS | Status: AC
  Administered 2019-04-25: 6 via TOPICAL

## 2019-04-25 MED ORDER — POTASSIUM CHLORIDE 2 MEQ/ML IV SOLN
80.0000 meq | INTRAVENOUS | Status: DC
  Filled 2019-04-25: qty 40

## 2019-04-25 MED ORDER — MILRINONE LACTATE IN DEXTROSE 20-5 MG/100ML-% IV SOLN
0.3000 ug/kg/min | INTRAVENOUS | Status: AC
  Administered 2019-04-26: 0.25 ug/kg/min via INTRAVENOUS
  Filled 2019-04-25: qty 100

## 2019-04-25 MED ORDER — TRANEXAMIC ACID (OHS) PUMP PRIME SOLUTION
2.0000 mg/kg | INTRAVENOUS | Status: DC
  Filled 2019-04-25: qty 2.19

## 2019-04-25 MED ORDER — BISACODYL 5 MG PO TBEC
5.0000 mg | DELAYED_RELEASE_TABLET | Freq: Once | ORAL | Status: AC
  Administered 2019-04-25: 5 mg via ORAL
  Filled 2019-04-25: qty 1

## 2019-04-25 MED ORDER — PHENYLEPHRINE HCL-NACL 20-0.9 MG/250ML-% IV SOLN
30.0000 ug/min | INTRAVENOUS | Status: DC
  Filled 2019-04-25: qty 250

## 2019-04-25 MED ORDER — INSULIN REGULAR(HUMAN) IN NACL 100-0.9 UT/100ML-% IV SOLN
INTRAVENOUS | Status: AC
  Administered 2019-04-26: 1.7 [IU]/h via INTRAVENOUS
  Filled 2019-04-25: qty 100

## 2019-04-25 MED ORDER — LEVOFLOXACIN IN D5W 500 MG/100ML IV SOLN
500.0000 mg | INTRAVENOUS | Status: AC
  Administered 2019-04-26: 500 mg via INTRAVENOUS
  Filled 2019-04-25: qty 100

## 2019-04-25 MED ORDER — EPINEPHRINE HCL 5 MG/250ML IV SOLN IN NS
0.0000 ug/min | INTRAVENOUS | Status: AC
  Administered 2019-04-26: 2 ug/min via INTRAVENOUS
  Filled 2019-04-25: qty 250

## 2019-04-25 MED ORDER — CHLORHEXIDINE GLUCONATE 0.12 % MT SOLN
15.0000 mL | Freq: Once | OROMUCOSAL | Status: AC
  Administered 2019-04-26: 15 mL via OROMUCOSAL
  Filled 2019-04-25: qty 15

## 2019-04-25 MED ORDER — MAGNESIUM SULFATE 50 % IJ SOLN
40.0000 meq | INTRAMUSCULAR | Status: DC
  Filled 2019-04-25: qty 9.85

## 2019-04-25 MED ORDER — TEMAZEPAM 15 MG PO CAPS: 15.0000 mg | ORAL_CAPSULE | Freq: Once | ORAL | Status: DC | PRN

## 2019-04-25 MED ORDER — SODIUM CHLORIDE 0.9 % IV SOLN
INTRAVENOUS | Status: DC
  Filled 2019-04-25: qty 30

## 2019-04-25 MED ORDER — TRANEXAMIC ACID 1000 MG/10ML IV SOLN
1.5000 mg/kg/h | INTRAVENOUS | Status: AC
  Administered 2019-04-26: 08:00:00 15 mg/kg/h via INTRAVENOUS
  Administered 2019-04-26: 1.5 mg/kg/h via INTRAVENOUS
  Filled 2019-04-25: qty 25

## 2019-04-25 MED ORDER — DEXMEDETOMIDINE HCL IN NACL 400 MCG/100ML IV SOLN
0.1000 ug/kg/h | INTRAVENOUS | Status: AC
  Administered 2019-04-26: .2 ug/kg/h via INTRAVENOUS
  Filled 2019-04-25: qty 100

## 2019-04-25 MED ORDER — NITROGLYCERIN IN D5W 200-5 MCG/ML-% IV SOLN
2.0000 ug/min | INTRAVENOUS | Status: DC
  Filled 2019-04-25: qty 250

## 2019-04-25 MED ORDER — PLASMA-LYTE 148 IV SOLN
INTRAVENOUS | Status: AC
  Administered 2019-04-26: 500 mL
  Filled 2019-04-25: qty 2.5

## 2019-04-25 MED ORDER — VANCOMYCIN HCL 10 G IV SOLR
1500.0000 mg | INTRAVENOUS | Status: AC
  Administered 2019-04-26: 07:00:00 1500 mg via INTRAVENOUS
  Filled 2019-04-25: qty 1500

## 2019-04-25 MED ORDER — TRANEXAMIC ACID (OHS) BOLUS VIA INFUSION
15.0000 mg/kg | INTRAVENOUS | Status: DC
  Filled 2019-04-25: qty 1646

## 2019-04-25 MED ORDER — CHLORHEXIDINE GLUCONATE CLOTH 2 % EX PADS: 6.0000 | MEDICATED_PAD | Freq: Once | CUTANEOUS | Status: DC

## 2019-04-25 MED ORDER — DOPAMINE-DEXTROSE 3.2-5 MG/ML-% IV SOLN
0.0000 ug/kg/min | INTRAVENOUS | Status: DC
  Filled 2019-04-25: qty 250

## 2019-04-25 NOTE — Progress Notes (Addendum)
Progress Note  [name] Date of Encounter: 04/25/2019  Primary Cardiologist: [name], MD   Subjective   No complaints today. He states "this is the best day I've had". Less confused. No CP or dyspnea. Ready for CABG tomorrow.   Inpatient Medications    Scheduled Meds:  sodium chloride   Intravenous Once   aspirin  81 mg Oral Daily   Chlorhexidine Gluconate Cloth  6 each Topical Daily   citalopram  10 mg Oral Daily   digoxin  0.125 mg Oral Daily   enoxaparin (LOVENOX) injection  40 mg Subcutaneous Q24H   finasteride  5 mg Oral Daily   insulin aspart  0-20 Units Subcutaneous TID WC   insulin aspart  0-5 Units Subcutaneous QHS   insulin aspart  6 Units Subcutaneous TID WC   insulin glargine  7 Units Subcutaneous Daily   lidocaine  2 patch Transdermal Q24H   losartan  12.5 mg Oral QHS   mouth rinse  15 mL Mouth Rinse BID   pantoprazole  40 mg Oral q morning - 10a   polyethylene glycol  17 g Oral BID   rosuvastatin  40 mg Oral q1800   senna-docusate  2 tablet Oral BID   sodium chloride flush  10-40 mL Intracatheter Q12H   sodium chloride flush  3 mL Intravenous Q12H   spironolactone  25 mg Oral Daily   Continuous Infusions:  sodium chloride     milrinone 0.25 mcg/kg/min (04/25/19 0802)   PRN Meds: sodium chloride, acetaminophen, bisacodyl, ipratropium-albuterol, lactulose, ondansetron (ZOFRAN) IV, sodium chloride flush, sodium chloride flush, traMADol   Vital Signs    Vitals:   04/25/19 0300 04/25/19 0500 04/25/19 0751 04/25/19 0809  BP:    104/60  Pulse:    90  Resp:    13  Temp: 98.3 F (36.8 C)  97.9 F (36.6 C) 98 F (36.7 C)  TempSrc: Oral  Oral Oral  SpO2:    97%  Weight:  109.7 kg    Height:        Intake/Output Summary (Last 24 hours) at 04/25/2019 0847 Last data filed at 04/25/2019 0413 Gross per 24 hour  Intake 545.84 ml  Output 650 ml  Net -104.16 ml   Last 3 Weights 04/25/2019 04/24/2019  04/23/2019  Weight (lbs) 241 lb 13.5 oz 243 lb 13.3 oz 248 lb 14.4 oz  Weight (kg) 109.7 kg 110.6 kg 112.9 kg      Telemetry    NSR 80s. No adverse arrthymias - Personally Reviewed  ECG    No new EKG to review today- Personally Reviewed  Physical Exam   GEN: obese elderly WM in No acute distress.   Neck: elevated JVD 5 cm above clavicle  Cardiac: RRR, no murmurs, rubs, or gallops.  Respiratory: Clear to auscultation bilaterally. GI: obese abdomen, rt flank hematoma, mildly distended abdomen, slightly tender w/ palpation of RUQ/RLQ. MS: No edema; No deformity. Neuro:  Nonfocal  Psych: Normal affect   Labs    High Sensitivity Troponin:   Recent Labs  Lab 04/12/19 1647 04/12/19 1826 04/12/19 2352  TROPONINIHS 136* 150* 1,963*      Chemistry Recent Labs  Lab 04/22/19 0411 04/23/19 0415 04/24/19 0334 04/25/19 0420  NA 126* 125* 128* 128*  K 4.5 4.5 4.8 4.8  CL 89* 92* 93* 92*  CO2 _0 GLUCOSE 198* 209* 166* 178*  BUN _1 CREATININE 1.09 1.35* 1.15 1.15  CALCIUM 9.5 8.9 9.7 9.7  PROT 5.3* 4.8* 5.5*  --   ALBUMIN 2.4* 2.3* 2.5*  --   AST 64* 61* 72*  --   ALT 61* 56* 64*  --   ALKPHOS 144* 154* 170*  --   BILITOT 2.5* 2.1* 2.2*  --   GFRNONAA >60 52* >60 >60  GFRAA >60 >60 >60 >60  ANIONGAP _0 Hematology Recent Labs  Lab 04/23/19 0415 04/24/19 0334 04/25/19 0420  WBC 12.7* 10.7* 11.3*  RBC 3.45* 3.62* 3.76*  HGB 10.4* 10.8* 11.1*  HCT 30.8* 32.7* 34.4*  MCV 89.3 90.3 91.5  MCH 30.1 29.8 29.5  MCHC 33.8 33.0 32.3  RDW 16.6* 16.8* 16.8*  PLT 218 268 270    BNPNo results for input(s): BNP, PROBNP in the last 168 hours.   DDimer No results for input(s): DDIMER in the last 168 hours.   Radiology    No results found.  Cardiac Studies   LHC 04/16/19 Procedures  RIGHT/LEFT HEART CATH AND CORONARY ANGIOGRAPHY  Conclusion   Severe three-vessel coronary artery disease involving the ostial proximal and mid LAD,  the mid RCA, and the mid and distal circumflex. There is moderate but not angiographically significant left main disease.  Global hypokinesis with reduced EF less than 30% has been noted by echocardiography. Overall picture is consistent with ischemic cardiomyopathy, possibly worsened EF with stunned myocardium in the setting of severe three-vessel CAD and increased demand caused by pneumonia and intubation.  Hemodynamics suggest that volume status is stable. Cardiac output is low due to left ventricular hypocontractility.  RECOMMENDATIONS:   Strong consideration for surgical revascularization once medical status is improved.  Guideline directed therapy for left ventricular systolic dysfunction which should also include anti-ischemic therapy.  Preventive therapy with high intensity statin therapy.  Consider advanced heart failure team assessment to help optimize clinical condition.  Debate whether viability imaging is necessary.   Right Heart  Right Heart Pressures Hemodynamic findings consistent with mild pulmonary hypertension. LV EDP is normal.  Wall Motion  Resting    LVEF less than 30% by echo with global hypokinesis noted.        Coronary Diagrams  Diagnostic Dominance: Right  Intervention - awaiting CABG   2D Echo 04/13/19 IMPRESSIONS   1. The left ventricle has severely reduced systolic function, with an ejection fraction of 25-30%. The cavity size was normal. There is mildly increased left ventricular wall thickness. Left ventricular diastolic Doppler parameters are indeterminate. 2. Severe hypokinesis of the left ventricular, entire inferior wall and anteroseptal wall. 3. Left atrial size was mildly dilated. 4. Small pericardial effusion. 5. The pericardial effusion is posterior to the left ventricle. 6. The mitral valve is abnormal. Mild thickening of the mitral valve leaflet. There is mild to moderate mitral annular calcification  present. 7. The aortic valve is tricuspid. Mild sclerosis of the aortic valve. No stenosis of the aortic valve. 8. The aorta is normal unless otherwise noted. 9. The inferior vena cava was dilated in size with <50% respiratory variability. 10. The interatrial septum was not assessed.  SUMMARY  Very technically difficult study, despite definity contrast. LVEF 25-30%, global hypokinesis with severe inferior and anteroseptal hypokinesis, indeterminate diastolic function, elevated LV filling pressure, mild LAE, MAC with mild MR, aortic valve sclerosis, dilated IVC, small posterior pericardial effusion.   Patient Profile     73 y.o. male w/ known CAD s/p MI 30 years ago w/ RCA PCI, who presented  initially to [facility] w/ acute hypoxic respiratory failure requiring intubation. CTA c/w RLL PNA. COVID negative. Hs Troponin c/w NSTEMI. 2D echo showed reduced LVEF at 25-30% w/ WMA. Subsequent LHC showed severe 3V CAD. Post cath recovery complicated by retroperitoneal bleed. IV heparin discontinued. S/p transfusion. Now awaiting CABG  Assessment & Plan    1. CAD/ NSTEMI: hs Troponin peaked at 1,963. Cardiac cath 04/16/19 showed severe three-vessel coronary artery disease involving the ostial proximal and mid LAD, the mid RCA, and the mid and distal circumflex. There is moderate but not angiographically significant left main disease. Awaiting CABG, tentatively scheduled for tomorrow w/ Dr. Cyndia Bent. Currently CP free. VSS. IV heparin discontinued due to RPB.  Continue ASA and statin. No ? blocker currently due to low output HF, requiring milrinone.    2. Acute Systolic CHF/ Ischemic Cardiomyopathy: euvolemic on exam. CVP normal ~7 today. Denies dyspnea. Net I/Os negative, -8.6L. No longer on loop diuretic due to hyponatremia. On spironolactone, 25 mg daily . SCr WNL, 1.15 and K normal at 4.8. He did not tolerate Entresto previously due to hypotension, but tolerating addition of low dose losartan ok (iniated  8/26). No ? blocker due to low output. Continue digoxin. Continue to monitor volume status. Continue milrinone, 0.25 mcg/kg/min. CO-Ox 55%. No adverse arrhthymias on tele.   3. Retroperitoneal Bleed/ Acute Blood Loss Anemia: post cath complication. Hgb dropped from 9.1>>7.3. Require blood transfusion. Hgb improving, now at 11.1.  IV heparin discontinued. Right flank still sore but pt notes subjective improvement w/ less pain.     4. Hyponatremia: Na has improved but remains low, up from 125>>128.  Mentating well. Loop diuretics discontinued. Continue to fluid restrict, <1500 mL/daily. Continue to monitor.   5. DM: on insulin. Has been controlled. Most recent hgb A1c was 7.0 in June but CBGs here have been high, up to low 200s. Management per IM.   6. Lipids: on statin, Crestor 40 mg nightly.  Last lipid panel in June showed LDL at 59 mg/dL. Despite good lipid control w/ LDL < 70, he still has severe multivessel CAD. Consider more aggressive lipid lowering therapy to drive LDL down to < 50 mg/dL. Can adjust regimen post discharge. Consider later addition of Zetia vs PCSK9 inhibitor.   7. AKI: improved. Scr down from 1.35>>1.15. Tolerating addition of losartan ok. No change in SCr or K since initiation.   For questions or updates, please contact [place] Please consult [email or web address] for contact info under       Signed, Lyda Jester, PA-C  04/25/2019, 8:47 AM    Patient seen with PA, agree with the above note.   He is stable today, no complaints other than sacral area pain.  Co-ox 55%, creatinine lower at 1.1.  He is not volume overloaded on exam.   I am not going to make any changes today, he is ready for CABG tomorrow.  I will continue milrinone 0.25.   Loralie Champagne 04/25/2019 1:54 PM

## 2019-04-25 NOTE — Progress Notes (Signed)
9 Days Post-Op Procedure(s) (LRB): RIGHT/LEFT HEART CATH AND CORONARY ANGIOGRAPHY (N/A) Subjective: Sitting up in chair eating. Says he feels the best he has this week today. Feels mentally clear. Only complaint is of constipation. Passing flatus and small amount of stool.  Objective: Vital signs in last 24 hours: Temp:  [97.5 F (36.4 C)-98.3 F (36.8 C)] 97.8 F (36.6 C) (08/27 1608) Pulse Rate:  [83-93] 89 (08/27 1608) Cardiac Rhythm: Normal sinus rhythm (08/27 0700) Resp:  [13-23] 19 (08/27 1608) BP: (104-121)/(60-64) 117/63 (08/27 1608) SpO2:  [95 %-98 %] 95 % (08/27 1608) Weight:  [109.7 kg] 109.7 kg (08/27 0500)  Hemodynamic parameters for last 24 hours: CVP:  [5 mmHg-8 mmHg] 8 mmHg  Intake/Output from previous day: 08/26 0701 - 08/27 0700 In: 829.1 [P.O.:600; I.V.:229.1] Out: 1300 [Urine:1300] Intake/Output this shift: No intake/output data recorded.  General appearance: alert and cooperative Heart: regular rate and rhythm, S1, S2 normal, no murmur Lungs: clear to auscultation bilaterally Abdomen: soft, non-tender; bowel sounds normal; protuberant Extremities: extremities normal, atraumatic, no cyanosis or edema  Lab Results: Recent Labs    04/24/19 0334 04/25/19 0420  WBC 10.7* 11.3*  HGB 10.8* 11.1*  HCT 32.7* 34.4*  PLT 268 270   BMET:  Recent Labs    04/24/19 0334 04/25/19 0420  NA 128* 128*  K 4.8 4.8  CL 93* 92*  CO2 26 28  GLUCOSE 166* 178*  BUN 16 15  CREATININE 1.15 1.15  CALCIUM 9.7 9.7    PT/INR: No results for input(s): LABPROT, INR in the last 72 hours. ABG    Component Value Date/Time   PHART 7.370 04/16/2019 0956   HCO3 29.8 (H) 04/16/2019 0956   TCO2 31 04/16/2019 0956   ACIDBASEDEF 3.4 (H) 04/12/2019 2149   O2SAT 55.3 04/25/2019 0405   CBG (last 3)  Recent Labs    04/25/19 0627 04/25/19 1109 04/25/19 1633  GLUCAP 156* 165* 215*    Assessment/Plan: S/P Procedure(s) (LRB): RIGHT/LEFT HEART CATH AND CORONARY  ANGIOGRAPHY (N/A)  He is hemodynamically stable on milrinone 0.25. Co-ox is 55% and stable. Plan CABG in am. I discussed the operative procedure with the patient and his wife including alternatives, benefits and risks; including but not limited to bleeding, blood transfusion, infection, stroke, myocardial infarction, graft failure, heart block requiring a permanent pacemaker, organ dysfunction, and death.  Cameron Daniel understands and agrees to proceed.    LOS: 13 days    Gaye Pollack 04/25/2019

## 2019-04-25 NOTE — Progress Notes (Signed)
[facility]  PROGRESS NOTE  Cameron Daniel [id number] DOB: 1946-02-19 DOA: 04/12/2019 PCP: [name], MD  Brief History    Cameron Daniel is a 73 y.o. year old male with medical history significant for CAD, HTN, DM, Colon polyps, IBS, HLD, GERD with esophageal stricture, Diverticulosis, Depression, Anxiety former smoker  who presented on 04/12/2019 to [facility] with increased shortness of breath, cough and chills and was found to have acute hypoxic respiratory failure secondary to right lower lobe pneumonia requiring intubation due to worsening hypoxia/respiratory distress on 8/14.  Extubated on 8/15 but due to significant respiratory distress needed BiPAP.  After extubation patient again decompensated requiring BiPAP due to worsening respiratory symptoms, prompting further evaluation by TTE which showed reduced ejection fraction with wall motion on right heart cath/left heart cath on 8/18 found to have severe three-vessel disease elevated PCWP, cardiac index of 1.6.  At that time advanced heart failure was consulted to optimize for CABG.  Course complicated by retroperitoneal hematoma found on 8/20 during evaluation of persistent right groin pain and acute 2 g drop in hemoglobin.  Patient received 2 units packed red blood cells on 8/28 hemoglobin has improved 8.8 from 7.3 in the last 24 hours.   Subjective    The patient was seen and examined this morning, stable no acute distress.  No issues overnight. Did not need a BiPAP overnight. Patient is informed that his CABG is scheduled for tomorrow.    Assessment and plan    NSTEMI Severe three-vessel disease/CAD.  -Denies any chest pain or shortness of breath. - high-sensitivity troponin trend 136--150--1963.  -Right/left heart cath on 8/18 shows low cardiac output as well as severe three-vessel disease. - Needs further optimization of cardiac index/output currently on milrinone as directed by advanced heart  failure team.  -Continue medical management -Cardiology/CTS following pending CABG -tentatively tomorrow 04/26/2019.  -Continue aspirin,  no need to resume heparin gtt per cardiology, CTS is following   Hypotension.  -Blood pressure remained stable, this morning 116/75   Patient remained asymptomatic.   No concern for bleeding, patient's hemoglobin is stable.  BP has improved with fluids  Closely monitor. Entresto discontinued due to low BP.   Retroperitoneal hematoma, stable. Acute on chronic anemia, improving.  -H&H remained stable, hematoma stable.  -Status post transfusion with 2 [place] PRBC, H&H monitoring remained to be stable -hemoglobin continues to improve from 7.3-8.8--10.9--10.3 >> 10.8  Continue to closely monitor.    CT abdomen showed moderate to large sized retroperitoneal hematoma.   Off heparin   Acute  Hyponatremia, continues to worsen.    -Improving -Sodium levels 126-125- -->128 today. -Concerning for over diuresing, mild dehydration -sodium stable with fluid restriction for presumed hyponatremia related to CHF/diminished cardiac output..  AHF started demclocycline  Hallucinations,resolved.   -Likely delirium, currently stable, improved Alert and oriented to person, place, time,   Leukocytosis, improving. -Likely reactive, due to acute bleeding, no signs of infection - continue to monitor.   -Patient remains afebrile, blood cultures if spikes fever, continue to closely monitor, no new medications likely contributing. -Completed 5 days course of antibiotic azithromycin/Rocephin, and 5 days of Levaquin total of 10 days of antibiotic coverage   Acute hypoxic respiratory failure, multifactorial secondary to community-acquired pneumonia and CHF with reduced EF,  -Remained stable improved shortness of breath, status post antibiotic treatment x5 days as it was/Rocephin  -  CT chest on 8/20 shows persistent  right-sided lower lobe consolidation from admission ( do  not  expect resolution of radiographic findings for several weeks), cough is stable, currently on room air with normal oxygen saturation, - complete additional 5 days of Levaquin to complete total 10 day course of antibiotics.  -continue goal-directed medical therapy for CHF addressed below. - Encouraged IS/flutter valve.    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    -Stable, improved shortness of breath. EF of 25% on TTE with wall motion abnormalities, Per AHF hold diuresis given euvolemia and looks a bit dry, continue milrinone gtt. continue digoxin, holding Coreg while on milrinone, spironolactone, low dose entresto d/c'd due to low BP,  -Tolerating losartan,  Filed Weights   04/23/19 0600 04/24/19 0600 04/25/19 0500  Weight: 112.9 kg 110.6 kg 109.7 kg      Transaminitis stable -LFTs continue to improve  AST and ALT in the 70s and 60s respectively, with abdominal pain no focal abnormalities on liver/biliary exam on CT abdomen on admission.    AKI, . -Remained stable. -Monitor BUN/creatinine  -Creatinine bumped 1.35 >> 1.15 Likely due to HF/low cardiac output.  Brief hypotension, -=avoid nephrotoxins  Depression,  stable continue Celexa  Type 2 diabetes, well controlled, A1c 7 (02/15/2019)  -Continue CBG QA CHS, SSI -Increasing Lantus from 5 to 7 units today. -FBG > 200, holding home meds ,    BPH, stable continue Proscar  GERD, stable.  Continue PPI  Hyperlipidemia, stable continue simvastatin  Chronic right hip pain.  Continue supportive care with Tylenol, avoid NSAIDs given CAD   DVT prophylaxis: Start enoxaparin now that hgb stable Code Status: Full code Family Communication: No family at bedside Disposition Plan: Continue milrinone drip to improve cardiac index,  Hold lasix, Levaquin to completed, monitoring kidney function, Na, BP, possible CABG next week as he improves clinically,     04/25/2019, 9:54 AM  LOS: 13 days   Consultants  .  PCCM, cardiology, CT surgery, advanced heart failure  Procedures  TTE; 04/13/2019   1. The left ventricle has severely reduced systolic function, with an ejection fraction of 25-30%. The cavity size was normal. There is mildly increased left ventricular wall thickness. Left ventricular diastolic Doppler parameters are indeterminate. 2. Severe hypokinesis of the left ventricular, entire inferior wall and anteroseptal wall. 3. Left atrial size was mildly dilated. 4. Small pericardial effusion. 5. The pericardial effusion is posterior to the left ventricle. 6. The mitral valve is abnormal. Mild thickening of the mitral valve leaflet. There is mild to moderate mitral annular calcification present. 7. The aortic valve is tricuspid. Mild sclerosis of the aortic valve. No stenosis of the aortic valve. 8. The aorta is normal unless otherwise noted. 9. The inferior vena cava was dilated in size with <50% respiratory variability. 10. The interatrial septum was not assessed.    Antibiotics  . Levaquin . Ceftriaxone 8/14-8/18 . Azithromycin 8/14-8/18 . Zosyn 8/14  Interval History/Subjective  No cough. Feels very well. Wants to have a BM  Objective   Vitals:  Vitals:   04/25/19 0751 04/25/19 0809  BP:  104/60  Pulse:  90  Resp:  13  Temp: 97.9 F (36.6 C) 98 F (36.7 C)  SpO2:  97%    Exam: BP 104/60 (BP Location: Left Arm)   Pulse 90   Temp 98 F (36.7 C) (Oral)   Resp 13   Ht 5\' 11"  (1.803 m)   Wt 109.7 kg   SpO2 97%   BMI 33.73 kg/m    Physical Exam  Constitution:  Alert,  cooperative, no distress,  Psychiatric: Normal and stable mood and affect, cognition intact,   HEENT: Normocephalic, PERRL, otherwise with in Normal limits  Chest:Chest symmetric Cardio vascular:  S1/S2, RRR, No murmure, No Rubs or Gallops  pulmonary: Clear to auscultation bilaterally, respirations unlabored, negative wheezes / crackles Abdomen: Soft, non-tender, non-distended, bowel  sounds,no masses, no organomegaly Muscular skeletal: Limited exam - in bed, able to move all 4 extremities, Normal strength,  Neuro: CNII-XII intact. , normal motor and sensation, reflexes intact  Extremities:+1 pitting edema lower extremities, +2 pulses  Skin: Large hematoma, flank area no changes.  Mild tenderness at the site.  Dry, warm to touch, negative for any Rashes, No open wounds Wounds: per nursing documentation      I have personally reviewed the following:   Data Reviewed: Basic Metabolic Panel: Recent Labs  Lab 04/21/19 0546 04/21/19 1107 04/22/19 0411 04/23/19 0415 04/24/19 0334 04/25/19 0420  NA 127*  --  126* 125* 128* 128*  K 4.4  --  4.5 4.5 4.8 4.8  CL 90*  --  89* 92* 93* 92*  CO2 27  --  27 25 26 28   GLUCOSE 206*  --  198* 209* 166* 178*  BUN 12  --  15 22 16 15   CREATININE 0.90 1.00 1.09 1.35* 1.15 1.15  CALCIUM 9.6  --  9.5 8.9 9.7 9.7   Liver Function Tests: Recent Labs  Lab 04/20/19 0415 04/21/19 0546 04/22/19 0411 04/23/19 0415 04/24/19 0334  AST 73* 76* 64* 61* 72*  ALT 56* 64* 61* 56* 64*  ALKPHOS 129* 144* 144* 154* 170*  BILITOT 1.8* 2.1* 2.5* 2.1* 2.2*  PROT 5.6* 5.5* 5.3* 4.8* 5.5*  ALBUMIN 2.6* 2.5* 2.4* 2.3* 2.5*   No results for input(s): LIPASE, AMYLASE in the last 168 hours. No results for input(s): AMMONIA in the last 168 hours. CBC: Recent Labs  Lab 04/21/19 1107 04/22/19 0411 04/23/19 0415 04/24/19 0334 04/25/19 0420  WBC 12.3* 12.6* 12.7* 10.7* 11.3*  HGB 10.9* 10.3* 10.4* 10.8* 11.1*  HCT 33.0* 32.3* 30.8* 32.7* 34.4*  MCV 89.9 90.5 89.3 90.3 91.5  PLT 249 245 218 268 270   Cardiac Enzymes: No results for input(s): CKTOTAL, CKMB, CKMBINDEX, TROPONINI in the last 168 hours. BNP (last 3 results) Recent Labs    04/12/19 1647  BNP 359.3*    ProBNP (last 3 results) No results for input(s): PROBNP in the last 8760 hours.  CBG: Recent Labs  Lab 04/24/19 0646 04/24/19 1045 04/24/19 1610 04/24/19 2131  04/25/19 0627  GLUCAP 162* 167* 203* 153* 156*    No results found for this or any previous visit (from the past 240 hour(s)).   Studies: No results found.  Scheduled Meds: . sodium chloride   Intravenous Once  . aspirin  81 mg Oral Daily  . Chlorhexidine Gluconate Cloth  6 each Topical Daily  . citalopram  10 mg Oral Daily  . digoxin  0.125 mg Oral Daily  . enoxaparin (LOVENOX) injection  40 mg Subcutaneous Q24H  . finasteride  5 mg Oral Daily  . insulin aspart  0-20 Units Subcutaneous TID WC  . insulin aspart  0-5 Units Subcutaneous QHS  . insulin aspart  6 Units Subcutaneous TID WC  . insulin glargine  7 Units Subcutaneous Daily  . lidocaine  2 patch Transdermal Q24H  . losartan  12.5 mg Oral QHS  . mouth rinse  15 mL Mouth Rinse BID  . pantoprazole  40 mg Oral q morning -  10a  . polyethylene glycol  17 g Oral BID  . rosuvastatin  40 mg Oral q1800  . senna-docusate  2 tablet Oral BID  . sodium chloride flush  10-40 mL Intracatheter Q12H  . sodium chloride flush  3 mL Intravenous Q12H  . spironolactone  25 mg Oral Daily   Continuous Infusions: . sodium chloride    . milrinone 0.25 mcg/kg/min (04/25/19 0802)      SIGNED: Deatra James, MD, FACP, FHM. [facility],  Pager [phone]  If 7PM-7AM, please contact night-coverage [email or web address], Password [facility] 04/24/2019, 10:18 AM

## 2019-04-25 NOTE — Progress Notes (Signed)
CARDIAC REHAB PHASE I   PRE:  Rate/Rhythm: 85 SR  BP:  Sitting: 111/62      SaO2: 100 RA  MODE:  Ambulation: 280 ft   POST:  Rate/Rhythm: 98 SR  BP:  Sitting: 108/68    SaO2: 98 RA   Pt ambulated 269ft in hallway standby assist with front wheel walker. Pt denies CP, dizziness, or SOB. Pt continues to be able to increase his distance and states he is feeling "himself" today. Pt seems 'clearer' today. Encouraged continued walks, and IS and Flutter use. Pt helped to [facility], call bell and bedside table within reach. RN made aware. Pt denies questions or concerns regarding upcoming surgery. Will continue to follow throughout his stay.  UJ:[id number] [name], RN BSN 04/25/2019 10:44 AM

## 2019-04-26 ENCOUNTER — Inpatient Hospital Stay (HOSPITAL_COMMUNITY): Payer: Medicare HMO | Admitting: Certified Registered Nurse Anesthetist

## 2019-04-26 ENCOUNTER — Inpatient Hospital Stay (HOSPITAL_COMMUNITY)
Admission: EM | Disposition: A | Discharge status: Home Health | Payer: Self-pay | Source: Home / Self Care | Attending: Internal Medicine

## 2019-04-26 ENCOUNTER — Encounter (HOSPITAL_COMMUNITY): Payer: Self-pay | Admitting: Anesthesiology

## 2019-04-26 ENCOUNTER — Inpatient Hospital Stay (HOSPITAL_COMMUNITY): Payer: Medicare HMO

## 2019-04-26 DIAGNOSIS — Z951 Presence of aortocoronary bypass graft: Secondary | ICD-10-CM

## 2019-04-26 DIAGNOSIS — I2510 Atherosclerotic heart disease of native coronary artery without angina pectoris: Secondary | ICD-10-CM

## 2019-04-26 HISTORY — PX: CORONARY ARTERY BYPASS GRAFT: SHX141

## 2019-04-26 HISTORY — PX: TEE WITHOUT CARDIOVERSION: SHX5443

## 2019-04-26 LAB — HEMOGLOBIN AND HEMATOCRIT, BLOOD
HCT: 24.4 % — ABNORMAL LOW (ref 39.0–52.0)
Hemoglobin: 8 g/dL — ABNORMAL LOW (ref 13.0–17.0)

## 2019-04-26 LAB — CBC
HCT: 33.3 % — ABNORMAL LOW (ref 39.0–52.0)
HCT: 27.1 % — ABNORMAL LOW (ref 39.0–52.0)
HCT: 30.7 % — ABNORMAL LOW (ref 39.0–52.0)
Hemoglobin: 11 g/dL — ABNORMAL LOW (ref 13.0–17.0)
Hemoglobin: 9 g/dL — ABNORMAL LOW (ref 13.0–17.0)
Hemoglobin: 10 g/dL — ABNORMAL LOW (ref 13.0–17.0)
MCH: 29.6 pg (ref 26.0–34.0)
MCH: 30.1 pg (ref 26.0–34.0)
MCH: 29.9 pg (ref 26.0–34.0)
MCHC: 33 g/dL (ref 30.0–36.0)
MCHC: 33.2 g/dL (ref 30.0–36.0)
MCHC: 32.6 g/dL (ref 30.0–36.0)
MCV: 89.8 fL (ref 80.0–100.0)
MCV: 90.6 fL (ref 80.0–100.0)
MCV: 91.9 fL (ref 80.0–100.0)
Platelets: 278 10*3/uL (ref 150–400)
Platelets: 248 10*3/uL (ref 150–400)
Platelets: 305 10*3/uL (ref 150–400)
RBC: 3.71 MIL/uL — ABNORMAL LOW (ref 4.22–5.81)
RBC: 2.99 MIL/uL — ABNORMAL LOW (ref 4.22–5.81)
RBC: 3.34 MIL/uL — ABNORMAL LOW (ref 4.22–5.81)
RDW: 16.6 % — ABNORMAL HIGH (ref 11.5–15.5)
RDW: 16.5 % — ABNORMAL HIGH (ref 11.5–15.5)
RDW: 16.6 % — ABNORMAL HIGH (ref 11.5–15.5)
WBC: 9.7 10*3/uL (ref 4.0–10.5)
WBC: 19.1 10*3/uL — ABNORMAL HIGH (ref 4.0–10.5)
WBC: 17.9 10*3/uL — ABNORMAL HIGH (ref 4.0–10.5)
nRBC: 0 % (ref 0.0–0.2)
nRBC: 0 % (ref 0.0–0.2)
nRBC: 0 % (ref 0.0–0.2)

## 2019-04-26 LAB — POCT I-STAT 4, (NA,K, GLUC, HGB,HCT)
Glucose, Bld: 145 mg/dL — ABNORMAL HIGH (ref 70–99)
Glucose, Bld: 145 mg/dL — ABNORMAL HIGH (ref 70–99)
Glucose, Bld: 127 mg/dL — ABNORMAL HIGH (ref 70–99)
Glucose, Bld: 128 mg/dL — ABNORMAL HIGH (ref 70–99)
Glucose, Bld: 163 mg/dL — ABNORMAL HIGH (ref 70–99)
Glucose, Bld: 176 mg/dL — ABNORMAL HIGH (ref 70–99)
HCT: 31 % — ABNORMAL LOW (ref 39.0–52.0)
HCT: 31 % — ABNORMAL LOW (ref 39.0–52.0)
HCT: 22 % — ABNORMAL LOW (ref 39.0–52.0)
HCT: 23 % — ABNORMAL LOW (ref 39.0–52.0)
HCT: 24 % — ABNORMAL LOW (ref 39.0–52.0)
HCT: 28 % — ABNORMAL LOW (ref 39.0–52.0)
Hemoglobin: 10.5 g/dL — ABNORMAL LOW (ref 13.0–17.0)
Hemoglobin: 10.5 g/dL — ABNORMAL LOW (ref 13.0–17.0)
Hemoglobin: 7.5 g/dL — ABNORMAL LOW (ref 13.0–17.0)
Hemoglobin: 7.8 g/dL — ABNORMAL LOW (ref 13.0–17.0)
Hemoglobin: 8.2 g/dL — ABNORMAL LOW (ref 13.0–17.0)
Hemoglobin: 9.5 g/dL — ABNORMAL LOW (ref 13.0–17.0)
Potassium: 4.3 mmol/L (ref 3.5–5.1)
Potassium: 4.4 mmol/L (ref 3.5–5.1)
Potassium: 4.5 mmol/L (ref 3.5–5.1)
Potassium: 4.8 mmol/L (ref 3.5–5.1)
Potassium: 4.3 mmol/L (ref 3.5–5.1)
Potassium: 3.8 mmol/L (ref 3.5–5.1)
Sodium: 127 mmol/L — ABNORMAL LOW (ref 135–145)
Sodium: 127 mmol/L — ABNORMAL LOW (ref 135–145)
Sodium: 127 mmol/L — ABNORMAL LOW (ref 135–145)
Sodium: 127 mmol/L — ABNORMAL LOW (ref 135–145)
Sodium: 128 mmol/L — ABNORMAL LOW (ref 135–145)
Sodium: 130 mmol/L — ABNORMAL LOW (ref 135–145)

## 2019-04-26 LAB — BASIC METABOLIC PANEL
Anion gap: 10 (ref 5–15)
Anion gap: 9 (ref 5–15)
BUN: 14 mg/dL (ref 8–23)
BUN: 11 mg/dL (ref 8–23)
CO2: 27 mmol/L (ref 22–32)
CO2: 24 mmol/L (ref 22–32)
Calcium: 9.7 mg/dL (ref 8.9–10.3)
Calcium: 9.1 mg/dL (ref 8.9–10.3)
Chloride: 91 mmol/L — ABNORMAL LOW (ref 98–111)
Chloride: 96 mmol/L — ABNORMAL LOW (ref 98–111)
Creatinine, Ser: 0.98 mg/dL (ref 0.61–1.24)
Creatinine, Ser: 0.93 mg/dL (ref 0.61–1.24)
GFR calc Af Amer: >60 mL/min (ref >60)
GFR calc Af Amer: >60 mL/min (ref >60)
GFR calc non Af Amer: >60 mL/min (ref >60)
GFR calc non Af Amer: >60 mL/min (ref >60)
Glucose, Bld: 167 mg/dL — ABNORMAL HIGH (ref 70–99)
Glucose, Bld: 167 mg/dL — ABNORMAL HIGH (ref 70–99)
Potassium: 4.5 mmol/L (ref 3.5–5.1)
Potassium: 5.2 mmol/L — ABNORMAL HIGH (ref 3.5–5.1)
Sodium: 128 mmol/L — ABNORMAL LOW (ref 135–145)
Sodium: 129 mmol/L — ABNORMAL LOW (ref 135–145)

## 2019-04-26 LAB — POCT I-STAT 7, (LYTES, BLD GAS, ICA,H+H)
Acid-Base Excess: 4 mmol/L — ABNORMAL HIGH (ref 0.0–2.0)
Acid-base deficit: 3 mmol/L — ABNORMAL HIGH (ref 0.0–2.0)
Bicarbonate: 29.1 mmol/L — ABNORMAL HIGH (ref 20.0–28.0)
Bicarbonate: 23.1 mmol/L (ref 20.0–28.0)
Bicarbonate: 25.8 mmol/L (ref 20.0–28.0)
Bicarbonate: 25.5 mmol/L (ref 20.0–28.0)
Bicarbonate: 25.7 mmol/L (ref 20.0–28.0)
Calcium, Ion: 1.13 mmol/L — ABNORMAL LOW (ref 1.15–1.40)
Calcium, Ion: 1.39 mmol/L (ref 1.15–1.40)
Calcium, Ion: 1.35 mmol/L (ref 1.15–1.40)
Calcium, Ion: 1.32 mmol/L (ref 1.15–1.40)
Calcium, Ion: 1.31 mmol/L (ref 1.15–1.40)
HCT: 24 % — ABNORMAL LOW (ref 39.0–52.0)
HCT: 38 % — ABNORMAL LOW (ref 39.0–52.0)
HCT: 30 % — ABNORMAL LOW (ref 39.0–52.0)
HCT: 29 % — ABNORMAL LOW (ref 39.0–52.0)
HCT: 32 % — ABNORMAL LOW (ref 39.0–52.0)
Hemoglobin: 8.2 g/dL — ABNORMAL LOW (ref 13.0–17.0)
Hemoglobin: 12.9 g/dL — ABNORMAL LOW (ref 13.0–17.0)
Hemoglobin: 10.2 g/dL — ABNORMAL LOW (ref 13.0–17.0)
Hemoglobin: 9.9 g/dL — ABNORMAL LOW (ref 13.0–17.0)
Hemoglobin: 10.9 g/dL — ABNORMAL LOW (ref 13.0–17.0)
O2 Saturation: 100 %
O2 Saturation: 97 %
O2 Saturation: 98 %
O2 Saturation: 96 %
O2 Saturation: 97 %
Patient temperature: 36.6
Patient temperature: 36.6
Patient temperature: 37
Patient temperature: 37.1
Potassium: 4.4 mmol/L (ref 3.5–5.1)
Potassium: 3.9 mmol/L (ref 3.5–5.1)
Potassium: 5.1 mmol/L (ref 3.5–5.1)
Potassium: 5.4 mmol/L — ABNORMAL HIGH (ref 3.5–5.1)
Potassium: 5.2 mmol/L — ABNORMAL HIGH (ref 3.5–5.1)
Sodium: 130 mmol/L — ABNORMAL LOW (ref 135–145)
Sodium: 131 mmol/L — ABNORMAL LOW (ref 135–145)
Sodium: 130 mmol/L — ABNORMAL LOW (ref 135–145)
Sodium: 130 mmol/L — ABNORMAL LOW (ref 135–145)
Sodium: 129 mmol/L — ABNORMAL LOW (ref 135–145)
TCO2: 30 mmol/L (ref 22–32)
TCO2: 24 mmol/L (ref 22–32)
TCO2: 27 mmol/L (ref 22–32)
TCO2: 27 mmol/L (ref 22–32)
TCO2: 27 mmol/L (ref 22–32)
pCO2 arterial: 46 mmHg (ref 32.0–48.0)
pCO2 arterial: 44.5 mmHg (ref 32.0–48.0)
pCO2 arterial: 45.4 mmHg (ref 32.0–48.0)
pCO2 arterial: 43.4 mmHg (ref 32.0–48.0)
pCO2 arterial: 43.7 mmHg (ref 32.0–48.0)
pH, Arterial: 7.409 (ref 7.350–7.450)
pH, Arterial: 7.321 — ABNORMAL LOW (ref 7.350–7.450)
pH, Arterial: 7.36 (ref 7.350–7.450)
pH, Arterial: 7.378 (ref 7.350–7.450)
pH, Arterial: 7.379 (ref 7.350–7.450)
pO2, Arterial: 322 mmHg — ABNORMAL HIGH (ref 83.0–108.0)
pO2, Arterial: 99 mmHg (ref 83.0–108.0)
pO2, Arterial: 104 mmHg (ref 83.0–108.0)
pO2, Arterial: 83 mmHg (ref 83.0–108.0)
pO2, Arterial: 92 mmHg (ref 83.0–108.0)

## 2019-04-26 LAB — PROTIME-INR
INR: 1.1 (ref 0.8–1.2)
INR: 1.4 — ABNORMAL HIGH (ref 0.8–1.2)
Prothrombin Time: 14.2 seconds (ref 11.4–15.2)
Prothrombin Time: 17.3 seconds — ABNORMAL HIGH (ref 11.4–15.2)

## 2019-04-26 LAB — BLOOD GAS, ARTERIAL
Acid-Base Excess: 4.5 mmol/L — ABNORMAL HIGH (ref 0.0–2.0)
Bicarbonate: 28.5 mmol/L — ABNORMAL HIGH (ref 20.0–28.0)
Drawn by: 418751
FIO2: 21
O2 Saturation: 95.4 %
Patient temperature: 98.6
pCO2 arterial: 42.6 mmHg (ref 32.0–48.0)
pH, Arterial: 7.441 (ref 7.350–7.450)
pO2, Arterial: 77.1 mmHg — ABNORMAL LOW (ref 83.0–108.0)

## 2019-04-26 LAB — URINALYSIS, ROUTINE W REFLEX MICROSCOPIC
Bilirubin Urine: NEGATIVE
Glucose, UA: NEGATIVE mg/dL
Hgb urine dipstick: NEGATIVE
Ketones, ur: NEGATIVE mg/dL
Leukocytes,Ua: NEGATIVE
Nitrite: NEGATIVE
Protein, ur: NEGATIVE mg/dL
Specific Gravity, Urine: 1.008 (ref 1.005–1.030)
pH: 6 (ref 5.0–8.0)

## 2019-04-26 LAB — GLUCOSE, CAPILLARY: Glucose-Capillary: 153 mg/dL — ABNORMAL HIGH (ref 70–99)

## 2019-04-26 LAB — HEMOGLOBIN A1C
Hgb A1c MFr Bld: 6.7 % — ABNORMAL HIGH (ref 4.8–5.6)
Mean Plasma Glucose: 145.59 mg/dL

## 2019-04-26 LAB — COOXEMETRY PANEL
Carboxyhemoglobin: 2.7 % — ABNORMAL HIGH (ref 0.5–1.5)
Methemoglobin: 1.1 % (ref 0.0–1.5)
O2 Saturation: 62.4 %
Total hemoglobin: 15.1 g/dL (ref 12.0–16.0)

## 2019-04-26 LAB — ECHO INTRAOPERATIVE TEE
Height: 71 in
Weight: 3855.4 oz

## 2019-04-26 LAB — APTT
aPTT: 34 seconds (ref 24–36)
aPTT: 31 seconds (ref 24–36)

## 2019-04-26 LAB — PLATELET COUNT: Platelets: 215 10*3/uL (ref 150–400)

## 2019-04-26 LAB — MAGNESIUM: Magnesium: 2.5 mg/dL — ABNORMAL HIGH (ref 1.7–2.4)

## 2019-04-26 SURGERY — CORONARY ARTERY BYPASS GRAFTING (CABG)
Anesthesia: General | Site: Chest

## 2019-04-26 MED ORDER — SODIUM BICARBONATE 8.4 % IV SOLN
50.0000 meq | Freq: Once | INTRAVENOUS | Status: AC
  Administered 2019-04-26: 50 meq via INTRAVENOUS

## 2019-04-26 MED ORDER — LACTATED RINGERS IV SOLN: INTRAVENOUS | Status: DC

## 2019-04-26 MED ORDER — THROMBIN (RECOMBINANT) 20000 UNITS EX SOLR
CUTANEOUS | Status: AC
  Filled 2019-04-26: qty 20000

## 2019-04-26 MED ORDER — TRANEXAMIC ACID 1000 MG/10ML IV SOLN
1.5000 mg/kg/h | INTRAVENOUS | Status: DC
  Filled 2019-04-26: qty 25

## 2019-04-26 MED ORDER — ONDANSETRON HCL 4 MG/2ML IJ SOLN
INTRAMUSCULAR | Status: AC
  Filled 2019-04-26: qty 2

## 2019-04-26 MED ORDER — VANCOMYCIN HCL IN DEXTROSE 1-5 GM/200ML-% IV SOLN
1000.0000 mg | Freq: Once | INTRAVENOUS | Status: AC
  Administered 2019-04-26: 1000 mg via INTRAVENOUS

## 2019-04-26 MED ORDER — SODIUM CHLORIDE 0.9% FLUSH
3.0000 mL | Freq: Two times a day (BID) | INTRAVENOUS | Status: DC
  Administered 2019-04-27: 3 mL via INTRAVENOUS

## 2019-04-26 MED ORDER — PROPOFOL 10 MG/ML IV BOLUS
INTRAVENOUS | Status: DC | PRN
  Administered 2019-04-26: 60 mg via INTRAVENOUS

## 2019-04-26 MED ORDER — MIDAZOLAM HCL 5 MG/5ML IJ SOLN
INTRAMUSCULAR | Status: DC | PRN
  Administered 2019-04-26 (×2): 2 mg via INTRAVENOUS
  Administered 2019-04-26: 1 mg via INTRAVENOUS
  Administered 2019-04-26: 5 mg via INTRAVENOUS

## 2019-04-26 MED ORDER — ACETAMINOPHEN 650 MG RE SUPP
650.0000 mg | Freq: Once | RECTAL | Status: AC
  Administered 2019-04-26: 650 mg via RECTAL

## 2019-04-26 MED ORDER — PROTAMINE SULFATE 10 MG/ML IV SOLN
INTRAVENOUS | Status: AC
  Filled 2019-04-26: qty 25

## 2019-04-26 MED ORDER — LACTATED RINGERS IV SOLN
INTRAVENOUS | Status: DC
  Administered 2019-04-28: 10 mL/h via INTRAVENOUS

## 2019-04-26 MED ORDER — PANTOPRAZOLE SODIUM 40 MG PO TBEC
40.0000 mg | DELAYED_RELEASE_TABLET | Freq: Every day | ORAL | Status: DC
  Administered 2019-04-28 – 2019-05-02 (×5): 40 mg via ORAL
  Filled 2019-04-26 (×5): qty 1

## 2019-04-26 MED ORDER — PHENYLEPHRINE HCL-NACL 20-0.9 MG/250ML-% IV SOLN: 0.0000 ug/min | INTRAVENOUS | Status: DC

## 2019-04-26 MED ORDER — LACTATED RINGERS IV SOLN
INTRAVENOUS | Status: DC | PRN
  Administered 2019-04-26 (×2): via INTRAVENOUS

## 2019-04-26 MED ORDER — THROMBIN 20000 UNITS EX SOLR
OROMUCOSAL | Status: DC | PRN
  Administered 2019-04-26 (×3): 4 mL via TOPICAL

## 2019-04-26 MED ORDER — MORPHINE SULFATE (PF) 2 MG/ML IV SOLN
1.0000 mg | INTRAVENOUS | Status: DC | PRN
  Administered 2019-04-26 – 2019-04-27 (×2): 2 mg via INTRAVENOUS
  Filled 2019-04-26 (×2): qty 1

## 2019-04-26 MED ORDER — ROSUVASTATIN CALCIUM 20 MG PO TABS
40.0000 mg | ORAL_TABLET | Freq: Every day | ORAL | Status: DC
  Administered 2019-04-27 – 2019-05-01 (×5): 40 mg via ORAL
  Filled 2019-04-26 (×5): qty 2

## 2019-04-26 MED ORDER — HEMOSTATIC AGENTS (NO CHARGE) OPTIME
TOPICAL | Status: DC | PRN
  Administered 2019-04-26: 1 via TOPICAL

## 2019-04-26 MED ORDER — SODIUM CHLORIDE 0.45 % IV SOLN
INTRAVENOUS | Status: DC | PRN
  Administered 2019-04-26: 13:00:00 via INTRAVENOUS

## 2019-04-26 MED ORDER — CITALOPRAM HYDROBROMIDE 20 MG PO TABS
10.0000 mg | ORAL_TABLET | Freq: Every day | ORAL | Status: DC
  Administered 2019-04-27 – 2019-05-02 (×6): 10 mg via ORAL
  Filled 2019-04-26 (×7): qty 1

## 2019-04-26 MED ORDER — ONDANSETRON HCL 4 MG/2ML IJ SOLN
4.0000 mg | Freq: Four times a day (QID) | INTRAMUSCULAR | Status: DC | PRN
  Administered 2019-04-27 – 2019-04-28 (×2): 4 mg via INTRAVENOUS
  Filled 2019-04-26 (×2): qty 2

## 2019-04-26 MED ORDER — BISACODYL 10 MG RE SUPP: 10.0000 mg | Freq: Every day | RECTAL | Status: DC

## 2019-04-26 MED ORDER — LEVOFLOXACIN IN D5W 750 MG/150ML IV SOLN
750.0000 mg | INTRAVENOUS | Status: AC
  Administered 2019-04-27: 750 mg via INTRAVENOUS
  Filled 2019-04-26: qty 150

## 2019-04-26 MED ORDER — SODIUM CHLORIDE 0.9 % IV SOLN
INTRAVENOUS | Status: DC
  Administered 2019-04-26: 13:00:00 via INTRAVENOUS

## 2019-04-26 MED ORDER — THROMBIN 20000 UNITS EX SOLR
CUTANEOUS | Status: DC | PRN
  Administered 2019-04-26: 20000 [IU] via TOPICAL

## 2019-04-26 MED ORDER — MAGNESIUM SULFATE 4 GM/100ML IV SOLN
4.0000 g | Freq: Once | INTRAVENOUS | Status: AC
  Administered 2019-04-26: 4 g via INTRAVENOUS
  Filled 2019-04-26: qty 100

## 2019-04-26 MED ORDER — FAMOTIDINE IN NACL 20-0.9 MG/50ML-% IV SOLN
20.0000 mg | Freq: Two times a day (BID) | INTRAVENOUS | Status: DC
  Administered 2019-04-26: 20 mg via INTRAVENOUS

## 2019-04-26 MED ORDER — PROTAMINE SULFATE 10 MG/ML IV SOLN
INTRAVENOUS | Status: DC | PRN
  Administered 2019-04-26 (×2): 150 mg via INTRAVENOUS

## 2019-04-26 MED ORDER — TRAMADOL HCL 50 MG PO TABS
50.0000 mg | ORAL_TABLET | ORAL | Status: DC | PRN
  Administered 2019-05-01 – 2019-05-02 (×2): 50 mg via ORAL
  Filled 2019-04-26 (×2): qty 1

## 2019-04-26 MED ORDER — PROTAMINE SULFATE 10 MG/ML IV SOLN
INTRAVENOUS | Status: AC
  Filled 2019-04-26: qty 10

## 2019-04-26 MED ORDER — ROCURONIUM BROMIDE 10 MG/ML (PF) SYRINGE
PREFILLED_SYRINGE | INTRAVENOUS | Status: AC
  Filled 2019-04-26: qty 20

## 2019-04-26 MED ORDER — CALCIUM CHLORIDE 10 % IV SOLN
INTRAVENOUS | Status: AC
  Filled 2019-04-26: qty 10

## 2019-04-26 MED ORDER — PHENYLEPHRINE HCL-NACL 40-0.9 MG/250ML-% IV SOLN
0.0000 ug/min | INTRAVENOUS | Status: DC
  Filled 2019-04-26: qty 250

## 2019-04-26 MED ORDER — HEPARIN SODIUM (PORCINE) 1000 UNIT/ML IJ SOLN
INTRAMUSCULAR | Status: DC | PRN
  Administered 2019-04-26: 34000 [IU] via INTRAVENOUS

## 2019-04-26 MED ORDER — ACETAMINOPHEN 500 MG PO TABS
1000.0000 mg | ORAL_TABLET | Freq: Four times a day (QID) | ORAL | Status: AC
  Administered 2019-04-26 – 2019-05-01 (×20): 1000 mg via ORAL
  Filled 2019-04-26 (×20): qty 2

## 2019-04-26 MED ORDER — LIDOCAINE 2% (20 MG/ML) 5 ML SYRINGE
INTRAMUSCULAR | Status: DC | PRN
  Administered 2019-04-26: 250 mg via INTRAVENOUS

## 2019-04-26 MED ORDER — HEPARIN SODIUM (PORCINE) 1000 UNIT/ML IJ SOLN
INTRAMUSCULAR | Status: AC
  Filled 2019-04-26: qty 1

## 2019-04-26 MED ORDER — INSULIN REGULAR BOLUS VIA INFUSION
0.0000 [IU] | Freq: Three times a day (TID) | INTRAVENOUS | Status: DC
  Filled 2019-04-26: qty 10

## 2019-04-26 MED ORDER — POTASSIUM CHLORIDE 10 MEQ/50ML IV SOLN
10.0000 meq | INTRAVENOUS | Status: AC
  Administered 2019-04-26 (×3): 10 meq via INTRAVENOUS

## 2019-04-26 MED ORDER — INSULIN REGULAR(HUMAN) IN NACL 100-0.9 UT/100ML-% IV SOLN
INTRAVENOUS | Status: DC
  Administered 2019-04-27: 4.8 [IU]/h via INTRAVENOUS
  Filled 2019-04-26: qty 100

## 2019-04-26 MED ORDER — ALBUMIN HUMAN 5 % IV SOLN
INTRAVENOUS | Status: DC | PRN
  Administered 2019-04-26 (×3): via INTRAVENOUS

## 2019-04-26 MED ORDER — ROCURONIUM BROMIDE 100 MG/10ML IV SOLN
INTRAVENOUS | Status: DC | PRN
  Administered 2019-04-26 (×4): 50 mg via INTRAVENOUS

## 2019-04-26 MED ORDER — SODIUM CHLORIDE 0.9 % IV SOLN
INTRAVENOUS | Status: DC | PRN
  Administered 2019-04-26: 25 ug/min via INTRAVENOUS

## 2019-04-26 MED ORDER — ACETAMINOPHEN 160 MG/5ML PO SOLN: 1000.0000 mg | Freq: Four times a day (QID) | ORAL | Status: AC

## 2019-04-26 MED ORDER — FENTANYL CITRATE (PF) 250 MCG/5ML IJ SOLN
INTRAMUSCULAR | Status: AC
  Filled 2019-04-26: qty 25

## 2019-04-26 MED ORDER — ASPIRIN 81 MG PO CHEW
324.0000 mg | CHEWABLE_TABLET | Freq: Every day | ORAL | Status: DC
  Filled 2019-04-26: qty 4

## 2019-04-26 MED ORDER — PHENYLEPHRINE HCL (PRESSORS) 10 MG/ML IV SOLN
INTRAVENOUS | Status: DC | PRN
  Administered 2019-04-26: 80 ug via INTRAVENOUS
  Administered 2019-04-26: 40 ug via INTRAVENOUS
  Administered 2019-04-26: 80 ug via INTRAVENOUS
  Administered 2019-04-26 (×2): 40 ug via INTRAVENOUS

## 2019-04-26 MED ORDER — MIDAZOLAM HCL 2 MG/2ML IJ SOLN: 2.0000 mg | INTRAMUSCULAR | Status: DC | PRN

## 2019-04-26 MED ORDER — METOPROLOL TARTRATE 25 MG/10 ML ORAL SUSPENSION: 12.5000 mg | Freq: Two times a day (BID) | ORAL | Status: DC

## 2019-04-26 MED ORDER — ARTIFICIAL TEARS OPHTHALMIC OINT
TOPICAL_OINTMENT | OPHTHALMIC | Status: DC | PRN
  Administered 2019-04-26: 1 via OPHTHALMIC

## 2019-04-26 MED ORDER — SODIUM CHLORIDE 0.9 % IV SOLN: 250.0000 mL | INTRAVENOUS | Status: DC

## 2019-04-26 MED ORDER — FINASTERIDE 5 MG PO TABS
5.0000 mg | ORAL_TABLET | Freq: Every day | ORAL | Status: DC
  Administered 2019-04-27 – 2019-05-02 (×6): 5 mg via ORAL
  Filled 2019-04-26 (×6): qty 1

## 2019-04-26 MED ORDER — NOREPINEPHRINE 16 MG/250ML-% IV SOLN
0.0000 ug/min | INTRAVENOUS | Status: DC
  Administered 2019-04-26: 3 ug/min via INTRAVENOUS
  Filled 2019-04-26: qty 250

## 2019-04-26 MED ORDER — FENTANYL CITRATE (PF) 250 MCG/5ML IJ SOLN
INTRAMUSCULAR | Status: DC | PRN
  Administered 2019-04-26: 100 ug via INTRAVENOUS
  Administered 2019-04-26: 150 ug via INTRAVENOUS
  Administered 2019-04-26: 200 ug via INTRAVENOUS
  Administered 2019-04-26 (×2): 100 ug via INTRAVENOUS
  Administered 2019-04-26: 150 ug via INTRAVENOUS
  Administered 2019-04-26: 50 ug via INTRAVENOUS
  Administered 2019-04-26: 100 ug via INTRAVENOUS
  Administered 2019-04-26: 50 ug via INTRAVENOUS
  Administered 2019-04-26: 250 ug via INTRAVENOUS

## 2019-04-26 MED ORDER — LACTATED RINGERS IV SOLN: 500.0000 mL | Freq: Once | INTRAVENOUS | Status: DC | PRN

## 2019-04-26 MED ORDER — BISACODYL 5 MG PO TBEC
10.0000 mg | DELAYED_RELEASE_TABLET | Freq: Every day | ORAL | Status: DC
  Administered 2019-04-27 – 2019-04-29 (×3): 10 mg via ORAL
  Filled 2019-04-26 (×3): qty 2

## 2019-04-26 MED ORDER — SODIUM CHLORIDE 0.9% FLUSH: 3.0000 mL | INTRAVENOUS | Status: DC | PRN

## 2019-04-26 MED ORDER — EPINEPHRINE HCL 5 MG/250ML IV SOLN IN NS
0.0000 ug/min | INTRAVENOUS | Status: DC
  Filled 2019-04-26: qty 250

## 2019-04-26 MED ORDER — MIDAZOLAM HCL (PF) 10 MG/2ML IJ SOLN
INTRAMUSCULAR | Status: AC
  Filled 2019-04-26: qty 2

## 2019-04-26 MED ORDER — CHLORHEXIDINE GLUCONATE 0.12 % MT SOLN
15.0000 mL | OROMUCOSAL | Status: AC
  Administered 2019-04-26: 15 mL via OROMUCOSAL

## 2019-04-26 MED ORDER — EPINEPHRINE PF 1 MG/ML IJ SOLN
0.0000 ug/min | INTRAVENOUS | Status: DC
  Filled 2019-04-26: qty 4

## 2019-04-26 MED ORDER — LACTATED RINGERS IV SOLN
INTRAVENOUS | Status: DC | PRN
  Administered 2019-04-26: 07:00:00 via INTRAVENOUS

## 2019-04-26 MED ORDER — ONDANSETRON HCL 4 MG/2ML IJ SOLN
INTRAMUSCULAR | Status: DC | PRN
  Administered 2019-04-26: 4 mg via INTRAVENOUS

## 2019-04-26 MED ORDER — DEXMEDETOMIDINE HCL IN NACL 400 MCG/100ML IV SOLN
0.0000 ug/kg/h | INTRAVENOUS | Status: DC
  Administered 2019-04-26: 0.7 ug/kg/h via INTRAVENOUS
  Filled 2019-04-26: qty 100

## 2019-04-26 MED ORDER — METOPROLOL TARTRATE 12.5 MG HALF TABLET
12.5000 mg | ORAL_TABLET | Freq: Two times a day (BID) | ORAL | Status: DC
  Administered 2019-04-26 – 2019-04-28 (×5): 12.5 mg via ORAL
  Filled 2019-04-26 (×5): qty 1

## 2019-04-26 MED ORDER — CHLORHEXIDINE GLUCONATE CLOTH 2 % EX PADS
6.0000 | MEDICATED_PAD | Freq: Every day | CUTANEOUS | Status: DC
  Administered 2019-04-26 – 2019-04-30 (×5): 6 via TOPICAL

## 2019-04-26 MED ORDER — OXYCODONE HCL 5 MG PO TABS
5.0000 mg | ORAL_TABLET | ORAL | Status: DC | PRN
  Administered 2019-04-27 – 2019-04-28 (×2): 10 mg via ORAL
  Administered 2019-04-30: 5 mg via ORAL
  Filled 2019-04-26: qty 1
  Filled 2019-04-26 (×2): qty 2

## 2019-04-26 MED ORDER — ASPIRIN EC 325 MG PO TBEC
325.0000 mg | DELAYED_RELEASE_TABLET | Freq: Every day | ORAL | Status: DC
  Administered 2019-04-27 – 2019-05-02 (×6): 325 mg via ORAL
  Filled 2019-04-26 (×6): qty 1

## 2019-04-26 MED ORDER — DOCUSATE SODIUM 100 MG PO CAPS
200.0000 mg | ORAL_CAPSULE | Freq: Every day | ORAL | Status: DC
  Administered 2019-04-27 – 2019-05-02 (×5): 200 mg via ORAL
  Filled 2019-04-26 (×4): qty 2

## 2019-04-26 MED ORDER — SODIUM CHLORIDE 0.9 % IV SOLN
INTRAVENOUS | Status: DC | PRN
  Administered 2019-04-26: 12:00:00 via INTRAVENOUS

## 2019-04-26 MED ORDER — METOPROLOL TARTRATE 5 MG/5ML IV SOLN: 2.5000 mg | INTRAVENOUS | Status: DC | PRN

## 2019-04-26 MED ORDER — MILRINONE LACTATE IN DEXTROSE 20-5 MG/100ML-% IV SOLN
0.1250 ug/kg/min | INTRAVENOUS | Status: DC
  Administered 2019-04-27 – 2019-04-28 (×3): 0.25 ug/kg/min via INTRAVENOUS
  Administered 2019-04-28: 0.125 ug/kg/min via INTRAVENOUS
  Filled 2019-04-26 (×4): qty 100

## 2019-04-26 MED ORDER — NITROGLYCERIN IN D5W 200-5 MCG/ML-% IV SOLN: 0.0000 ug/min | INTRAVENOUS | Status: DC

## 2019-04-26 MED ORDER — PROPOFOL 10 MG/ML IV BOLUS
INTRAVENOUS | Status: AC
  Filled 2019-04-26: qty 20

## 2019-04-26 MED ORDER — PHENYLEPHRINE 40 MCG/ML (10ML) SYRINGE FOR IV PUSH (FOR BLOOD PRESSURE SUPPORT)
PREFILLED_SYRINGE | INTRAVENOUS | Status: AC
  Filled 2019-04-26: qty 10

## 2019-04-26 MED ORDER — ALBUMIN HUMAN 5 % IV SOLN
250.0000 mL | INTRAVENOUS | Status: AC | PRN
  Administered 2019-04-26 – 2019-04-27 (×3): 12.5 g via INTRAVENOUS
  Filled 2019-04-26: qty 500

## 2019-04-26 MED ORDER — ACETAMINOPHEN 160 MG/5ML PO SOLN: 650.0000 mg | Freq: Once | ORAL | Status: AC

## 2019-04-26 SURGICAL SUPPLY — 115 items
ADH SKN CLS APL DERMABOND .7 (GAUZE/BANDAGES/DRESSINGS) ×2
BAG DECANTER FOR FLEXI CONT (MISCELLANEOUS) ×3 IMPLANT
BASKET HEART (ORDER IN 25'S) (MISCELLANEOUS) ×1
BASKET HEART (ORDER IN 25S) (MISCELLANEOUS) ×2 IMPLANT
BLADE CLIPPER SURG (BLADE) ×3 IMPLANT
BLADE STERNUM SYSTEM 6 (BLADE) ×3 IMPLANT
BLADE SURG 11 STRL SS (BLADE) ×1 IMPLANT
BNDG CMPR MED 10X6 ELC LF (GAUZE/BANDAGES/DRESSINGS) ×2
BNDG ELASTIC 4X5.8 VLCR STR LF (GAUZE/BANDAGES/DRESSINGS) ×3 IMPLANT
BNDG ELASTIC 6X10 VLCR STRL LF (GAUZE/BANDAGES/DRESSINGS) ×1 IMPLANT
BNDG ELASTIC 6X5.8 VLCR STR LF (GAUZE/BANDAGES/DRESSINGS) ×3 IMPLANT
BNDG GAUZE ELAST 4 BULKY (GAUZE/BANDAGES/DRESSINGS) ×3 IMPLANT
CANISTER SUCT 3000ML PPV (MISCELLANEOUS) ×3 IMPLANT
CANNULA ARTERIAL NVNT 3/8 22FR (MISCELLANEOUS) ×1 IMPLANT
CATH ROBINSON RED A/P 18FR (CATHETERS) ×6 IMPLANT
CATH THORACIC 28FR (CATHETERS) ×3 IMPLANT
CATH THORACIC 36FR (CATHETERS) ×3 IMPLANT
CATH THORACIC 36FR RT ANG (CATHETERS) ×3 IMPLANT
CLIP VESOCCLUDE MED 24/CT (CLIP) IMPLANT
CLIP VESOCCLUDE SM WIDE 24/CT (CLIP) ×1 IMPLANT
COVER WAND RF STERILE (DRAPES) ×2 IMPLANT
DEFOGGER ANTIFOG KIT (MISCELLANEOUS) ×1 IMPLANT
DERMABOND ADVANCED (GAUZE/BANDAGES/DRESSINGS) ×1
DERMABOND ADVANCED .7 DNX12 (GAUZE/BANDAGES/DRESSINGS) IMPLANT
DRAPE CARDIOVASCULAR INCISE (DRAPES) ×3
DRAPE SLUSH/WARMER DISC (DRAPES) ×3 IMPLANT
DRAPE SRG 135X102X78XABS (DRAPES) ×2 IMPLANT
DRSG COVADERM 4X14 (GAUZE/BANDAGES/DRESSINGS) ×3 IMPLANT
ELECT CAUTERY BLADE 6.4 (BLADE) ×3 IMPLANT
ELECT REM PT RETURN 9FT ADLT (ELECTROSURGICAL) ×6
ELECTRODE REM PT RTRN 9FT ADLT (ELECTROSURGICAL) ×4 IMPLANT
FELT TEFLON 1X6 (MISCELLANEOUS) ×5 IMPLANT
GAUZE SPONGE 4X4 12PLY STRL (GAUZE/BANDAGES/DRESSINGS) ×6 IMPLANT
GAUZE SPONGE 4X4 12PLY STRL LF (GAUZE/BANDAGES/DRESSINGS) ×2 IMPLANT
GLOVE BIO SURGEON STRL SZ 6 (GLOVE) ×1 IMPLANT
GLOVE BIO SURGEON STRL SZ 6.5 (GLOVE) ×11 IMPLANT
GLOVE BIO SURGEON STRL SZ7 (GLOVE) IMPLANT
GLOVE BIO SURGEON STRL SZ7.5 (GLOVE) IMPLANT
GLOVE BIOGEL PI IND STRL 6 (GLOVE) IMPLANT
GLOVE BIOGEL PI IND STRL 6.5 (GLOVE) IMPLANT
GLOVE BIOGEL PI IND STRL 7.0 (GLOVE) IMPLANT
GLOVE BIOGEL PI INDICATOR 6 (GLOVE) ×3
GLOVE BIOGEL PI INDICATOR 6.5 (GLOVE) ×6
GLOVE BIOGEL PI INDICATOR 7.0 (GLOVE)
GLOVE EUDERMIC 7 POWDERFREE (GLOVE) ×6 IMPLANT
GLOVE ORTHO TXT STRL SZ7.5 (GLOVE) IMPLANT
GLOVE SURG SS PI 6.0 STRL IVOR (GLOVE) ×2 IMPLANT
GOWN STRL REUS W/ TWL LRG LVL3 (GOWN DISPOSABLE) ×8 IMPLANT
GOWN STRL REUS W/ TWL XL LVL3 (GOWN DISPOSABLE) ×2 IMPLANT
GOWN STRL REUS W/TWL LRG LVL3 (GOWN DISPOSABLE) ×24
GOWN STRL REUS W/TWL XL LVL3 (GOWN DISPOSABLE) ×3
HEMOSTAT POWDER SURGIFOAM 1G (HEMOSTASIS) ×9 IMPLANT
HEMOSTAT SURGICEL 2X14 (HEMOSTASIS) ×3 IMPLANT
INSERT FOGARTY 61MM (MISCELLANEOUS) IMPLANT
INSERT FOGARTY XLG (MISCELLANEOUS) IMPLANT
KIT BASIN OR (CUSTOM PROCEDURE TRAY) ×3 IMPLANT
KIT CATH CPB BARTLE (MISCELLANEOUS) ×3 IMPLANT
KIT SUCTION CATH 14FR (SUCTIONS) ×3 IMPLANT
KIT TURNOVER KIT B (KITS) ×3 IMPLANT
KIT VASOVIEW HEMOPRO 2 VH 4000 (KITS) ×3 IMPLANT
NS IRRIG 1000ML POUR BTL (IV SOLUTION) ×16 IMPLANT
PACK E OPEN HEART (SUTURE) ×3 IMPLANT
PACK OPEN HEART (CUSTOM PROCEDURE TRAY) ×3 IMPLANT
PAD ARMBOARD 7.5X6 YLW CONV (MISCELLANEOUS) ×6 IMPLANT
PAD ELECT DEFIB RADIOL ZOLL (MISCELLANEOUS) ×3 IMPLANT
PENCIL BUTTON HOLSTER BLD 10FT (ELECTRODE) ×3 IMPLANT
POSITIONER HEAD DONUT 9IN (MISCELLANEOUS) ×3 IMPLANT
PUNCH AORTIC ROTATE 4.0MM (MISCELLANEOUS) IMPLANT
PUNCH AORTIC ROTATE 4.5MM 8IN (MISCELLANEOUS) ×3 IMPLANT
PUNCH AORTIC ROTATE 5MM 8IN (MISCELLANEOUS) IMPLANT
SET CARDIOPLEGIA MPS 5001102 (MISCELLANEOUS) ×1 IMPLANT
SPONGE INTESTINAL PEANUT (DISPOSABLE) IMPLANT
SPONGE LAP 18X18 RF (DISPOSABLE) IMPLANT
SPONGE LAP 18X18 X RAY DECT (DISPOSABLE) ×1 IMPLANT
SPONGE LAP 4X18 RFD (DISPOSABLE) ×4 IMPLANT
SUCTION CARDIAC 10 FR ×1 IMPLANT
SUT BONE WAX W31G (SUTURE) ×3 IMPLANT
SUT MNCRL AB 4-0 PS2 18 (SUTURE) ×1 IMPLANT
SUT PROLENE 3 0 SH DA (SUTURE) IMPLANT
SUT PROLENE 3 0 SH1 36 (SUTURE) ×4 IMPLANT
SUT PROLENE 4 0 RB 1 (SUTURE)
SUT PROLENE 4 0 SH DA (SUTURE) IMPLANT
SUT PROLENE 4-0 RB1 .5 CRCL 36 (SUTURE) IMPLANT
SUT PROLENE 5 0 C 1 36 (SUTURE) IMPLANT
SUT PROLENE 6 0 C 1 30 (SUTURE) ×1 IMPLANT
SUT PROLENE 7 0 BV 1 (SUTURE) IMPLANT
SUT PROLENE 7 0 BV1 MDA (SUTURE) ×3 IMPLANT
SUT PROLENE 8 0 BV175 6 (SUTURE) IMPLANT
SUT SILK  1 MH (SUTURE)
SUT SILK 1 MH (SUTURE) IMPLANT
SUT SILK 2 0 SH (SUTURE) IMPLANT
SUT SILK 2 0 SH CR/8 (SUTURE) ×1 IMPLANT
SUT STEEL STERNAL CCS#1 18IN (SUTURE) IMPLANT
SUT STEEL SZ 6 DBL 3X14 BALL (SUTURE) ×3 IMPLANT
SUT VIC AB 1 CTX 36 (SUTURE) ×9
SUT VIC AB 1 CTX36XBRD ANBCTR (SUTURE) ×4 IMPLANT
SUT VIC AB 2-0 CT1 27 (SUTURE)
SUT VIC AB 2-0 CT1 TAPERPNT 27 (SUTURE) IMPLANT
SUT VIC AB 2-0 CTX 27 (SUTURE) IMPLANT
SUT VIC AB 3-0 SH 27 (SUTURE)
SUT VIC AB 3-0 SH 27X BRD (SUTURE) IMPLANT
SUT VIC AB 3-0 X1 27 (SUTURE) IMPLANT
SUT VICRYL 4-0 PS2 18IN ABS (SUTURE) IMPLANT
SYSTEM SAHARA CHEST DRAIN ATS (WOUND CARE) ×3 IMPLANT
TAPE CLOTH SURG 4X10 WHT LF (GAUZE/BANDAGES/DRESSINGS) ×1 IMPLANT
TAPE PAPER 2X10 WHT MICROPORE (GAUZE/BANDAGES/DRESSINGS) ×1 IMPLANT
TOWEL GREEN STERILE (TOWEL DISPOSABLE) ×3 IMPLANT
TOWEL GREEN STERILE FF (TOWEL DISPOSABLE) ×2 IMPLANT
TRAY FOLEY SLVR 16FR TEMP STAT (SET/KITS/TRAYS/PACK) ×3 IMPLANT
TUBE CONNECTING 20X1/4 (TUBING) ×1 IMPLANT
TUBE SUCTION CARDIAC 10FR (CANNULA) IMPLANT
TUBING LAP HI FLOW INSUFFLATIO (TUBING) ×3 IMPLANT
UNDERPAD 30X30 (UNDERPADS AND DIAPERS) ×3 IMPLANT
WATER STERILE IRR 1000ML POUR (IV SOLUTION) ×6 IMPLANT
YANKAUER SUCT BULB TIP NO VENT (SUCTIONS) ×1 IMPLANT

## 2019-04-26 NOTE — Anesthesia Preprocedure Evaluation (Addendum)
Anesthesia Evaluation  Patient identified by MRN, date of birth, ID band Patient awake    Reviewed: Allergy & Precautions, NPO status , Patient's Chart, lab work & pertinent test results, reviewed documented beta blocker date and time   Airway Mallampati: II  TM Distance: >3 FB Neck ROM: full    Dental  (+) Teeth Intact, Dental Advidsory Given   Pulmonary former smoker,    breath sounds clear to auscultation       Cardiovascular hypertension, + CAD and + Past MI   Rhythm:Regular Rate:Normal     Neuro/Psych PSYCHIATRIC DISORDERS Anxiety Depression    GI/Hepatic GERD  Medicated and Controlled,  Endo/Other  diabetes  Renal/GU      Musculoskeletal   Abdominal   Peds  Hematology  (+) Blood dyscrasia, anemia ,   Anesthesia Other Findings   Reproductive/Obstetrics                            Anesthesia Physical Anesthesia Plan  ASA: IV  Anesthesia Plan: General   Post-op Pain Management:    Induction: Intravenous  PONV Risk Score and Plan: Ondansetron and Dexamethasone  Airway Management Planned: Oral ETT  Additional Equipment: Arterial line, CVP, PA Cath, 3D TEE and Ultrasound Guidance Line Placement  Intra-op Plan:   Post-operative Plan: Post-operative intubation/ventilation  Informed Consent: I have reviewed the patients History and Physical, chart, labs and discussed the procedure including the risks, benefits and alternatives for the proposed anesthesia with the patient or authorized representative who has indicated his/her understanding and acceptance.     Dental Advisory Given and Dental advisory given  Plan Discussed with: CRNA and Anesthesiologist  Anesthesia Plan Comments:        Anesthesia Quick Evaluation

## 2019-04-26 NOTE — Anesthesia Procedure Notes (Signed)
Central Venous Catheter Insertion Performed by: Roberts Gaudy, MD, anesthesiologist Start/End8/28/2020 7:00 AM, 04/26/2019 7:10 AM Patient location: Pre-op. Preanesthetic checklist: patient identified, IV checked, site marked, risks and benefits discussed, surgical consent, monitors and equipment checked, pre-op evaluation, timeout performed and anesthesia consent Lidocaine 1% used for infiltration and patient sedated Hand hygiene performed  and maximum sterile barriers used  Catheter size: 8.5 Fr Sheath introducer Procedure performed using ultrasound guided technique. Ultrasound Notes:anatomy identified, needle tip was noted to be adjacent to the nerve/plexus identified, no ultrasound evidence of intravascular and/or intraneural injection and image(s) printed for medical record Attempts: 1 Following insertion, line sutured and dressing applied. Post procedure assessment: blood return through all ports, free fluid flow and no air  Patient tolerated the procedure well with no immediate complications.

## 2019-04-26 NOTE — Anesthesia Procedure Notes (Signed)
Central Venous Catheter Insertion Performed by: Roberts Gaudy, MD, anesthesiologist Start/End8/28/2020 7:00 AM, 04/26/2019 7:10 AM Patient location: Pre-op. Preanesthetic checklist: patient identified, IV checked, site marked, risks and benefits discussed, surgical consent, monitors and equipment checked, pre-op evaluation, timeout performed and anesthesia consent Hand hygiene performed  and maximum sterile barriers used  PA cath was placed.Swan type:thermodilution Procedure performed without using ultrasound guided technique. Attempts: 1 Patient tolerated the procedure well with no immediate complications.

## 2019-04-26 NOTE — Procedures (Signed)
Extubation Procedure Note  Patient Details:   Name: Cameron Daniel DOB: [birth year] MRN: [id number]   Airway Documentation:    Vent end date: 04/26/19 Vent end time: 1858   Evaluation  O2 sats: stable throughout Complications: No apparent complications Patient did tolerate procedure well. Bilateral Breath Sounds: Clear, Diminished   Yes.  Pt extubated per rapid wean protocol. Pt NIF -25 and Vital Capacity 826ml. Pt with cuff leak. Pt extubated to 2L nasal cannula. Pt able to speak name, good cough and no stridor was heard. IS was initiated with pt. RT will continue to monitor.   Sharla Kidney 04/26/2019, 7:24 PM

## 2019-04-26 NOTE — Brief Op Note (Signed)
04/12/2019 - 04/26/2019  8:18 AM  PATIENT:  Cameron Daniel  73 y.o. male  PRE-OPERATIVE DIAGNOSIS:  Coronary Artery Disease  POST-OPERATIVE DIAGNOSIS:  Coronary Artery Disease  PROCEDURE:  Procedure(s): CORONARY ARTERY BYPASS GRAFTING (CABG) x Three, using left internal mammary artery and right leg greater saphenous vein harvested endoscopically (N/A) TRANSESOPHAGEAL ECHOCARDIOGRAM (TEE) (N/A)   LIMA to LAD SVG to RCA (distal) SVG to Circ  SURGEON:  Surgeon(s) and Role:    * Bartle, Fernande Boyden, MD - Primary  PHYSICIAN ASSISTANT:   Nicholes Rough, PA-C   ANESTHESIA:   general  EBL:  1100 mL   BLOOD ADMINISTERED:none  DRAINS: routine   LOCAL MEDICATIONS USED:  NONE  SPECIMEN:  No Specimen  DISPOSITION OF SPECIMEN:  PATHOLOGY  COUNTS:  YES  DICTATION: .Dragon Dictation  PLAN OF CARE: Admit to inpatient   PATIENT DISPOSITION:  ICU - intubated and hemodynamically stable.   Delay start of Pharmacological VTE agent (>24hrs) due to surgical blood loss or risk of bleeding: yes

## 2019-04-26 NOTE — Progress Notes (Signed)
      [address]       [place] [id number]             [phone]      S/p CABG x 3  Checking parameters for extubation  BP 104/62 (BP Location: Left Arm)   Pulse 95   Temp 97.9 F (36.6 C) (Core)   Resp (!) 25   Ht 5\' 11"  (1.803 m)   Wt 109.3 kg   SpO2 100%   BMI 33.61 kg/m   CI= 2.4 on 0.25 milrinone and 2 mcg/min of epi  Intake/Output Summary (Last 24 hours) at 04/26/2019 1725 Last data filed at 04/26/2019 1700 Gross per 24 hour  Intake 5876.07 ml  Output 3520 ml  Net 2356.07 ml   Hct= 38 K= 3.9 Minimal CT output  Doing well early postop  Remo Lipps C. Roxan Hockey, MD [facility] [phone]

## 2019-04-26 NOTE — Progress Notes (Signed)
Pt failed NIF and Vital capacity. Dr. Roxan Hockey at bedside and aware. Pt placed on initial settings. Rapid wean to restart when pt more awake.

## 2019-04-26 NOTE — Transfer of Care (Signed)
Immediate Anesthesia Transfer of Care Note  Patient: Cameron Daniel  Procedure(s) Performed: CORONARY ARTERY BYPASS GRAFTING (CABG) x Three, using left internal mammary artery and right leg greater saphenous vein harvested endoscopically (N/A Chest) TRANSESOPHAGEAL ECHOCARDIOGRAM (TEE) (N/A )  Patient Location: SICU  Anesthesia Type:General  Level of Consciousness: Patient remains intubated per anesthesia plan  Airway & Oxygen Therapy: Patient remains intubated per anesthesia plan and Patient placed on Ventilator (see vital sign flow sheet for setting)  Post-op Assessment: Report given to RN and Post -op Vital signs reviewed and stable  Post vital signs: Reviewed and stable  Last Vitals:  Vitals Value Taken Time  BP    Temp 36.6 C 04/26/19 1301  Pulse    Resp 20 04/26/19 1301  SpO2    Vitals shown include unvalidated device data.  Last Pain:  Vitals:   04/25/19 2358  TempSrc: Oral  PainSc:       Patients Stated Pain Goal: 0 [phone]  Complications: No apparent anesthesia complications

## 2019-04-26 NOTE — Anesthesia Procedure Notes (Signed)
Arterial Line Insertion Start/End8/28/2020 6:45 AM, 04/26/2019 6:55 AM Performed by: Neldon Newport, CRNA, CRNA  Patient location: Pre-op. Preanesthetic checklist: patient identified, IV checked, site marked, risks and benefits discussed, surgical consent, monitors and equipment checked, pre-op evaluation and timeout performed Lidocaine 1% used for infiltration and patient sedated Left, radial was placed Catheter size: 20 G Hand hygiene performed  and maximum sterile barriers used   Attempts: 1 Procedure performed without using ultrasound guided technique. Following insertion, dressing applied and Biopatch. Post procedure assessment: normal and unchanged  Patient tolerated the procedure well with no immediate complications.

## 2019-04-26 NOTE — Anesthesia Postprocedure Evaluation (Signed)
Anesthesia Post Note  Patient: Cameron Daniel  Procedure(s) Performed: CORONARY ARTERY BYPASS GRAFTING (CABG) x Three, using left internal mammary artery and right leg greater saphenous vein harvested endoscopically (N/A Chest) TRANSESOPHAGEAL ECHOCARDIOGRAM (TEE) (N/A )     Patient location during evaluation: SICU Anesthesia Type: General Level of consciousness: sedated and patient remains intubated per anesthesia plan Pain management: pain level controlled Vital Signs Assessment: post-procedure vital signs reviewed and stable Respiratory status: patient remains intubated per anesthesia plan and patient on ventilator - see flowsheet for VS Cardiovascular status: stable Postop Assessment: no apparent nausea or vomiting Anesthetic complications: no    Last Vitals:  Vitals:   04/26/19 1600 04/26/19 1615  BP: 96/62 105/67  Pulse: 96 93  Resp: 16 (!) 26  Temp: 36.6 C 36.6 C  SpO2: 100% 100%    Last Pain:  Vitals:   04/26/19 1615  TempSrc: Core (Comment)  PainSc:                  Cameron Daniel

## 2019-04-26 NOTE — Op Note (Signed)
CARDIOVASCULAR SURGERY OPERATIVE NOTE  04/26/2019  Surgeon:  Gaye Pollack, MD  First Assistant: Nicholes Rough, PA-C   Preoperative Diagnosis:  Severe multi-vessel coronary artery disease   Postoperative Diagnosis:  Same   Procedure:  1. Median Sternotomy 2. Extracorporeal circulation 3.   Coronary artery bypass grafting x 3   Left internal mammary graft to the LAD  SVG to OM  SVG to distal RCA  4.   Endoscopic vein harvest from the right leg   Anesthesia:  General Endotracheal   Clinical History/Surgical Indication:  This 73 year old gentleman presented with right lower lobe pneumonia and ruled in for non-ST segment elevation MI and subsequently developed progressive hypoxemia and respiratory failure requiring intubation in the emergency room with a post intubation x-ray showing central pulmonary edema.  His echocardiogram showed an ejection fraction of 25% although it was a poor quality study.  Cardiac catheterization showed severe multivessel coronary disease with high-grade calcified stenoses in the LAD, left circumflex, and right coronary arteries.  His coronary vessels are diffusely calcified and have a diabetic vessel appearance.  Right heart catheterization showed compensated filling pressures with low cardiac output.  The patient was evaluated by Dr. [name] in the advanced heart failure team.  He was started on milrinone with improvement in his Co-ox.  It was felt that coronary bypass graft surgery was the best treatment for him.  Unfortunately he developed a retroperitoneal bleed after catheterization with a significant drop in his hematocrit requiring transfusion and discontinuation of anticoagulation.  He recovered from this and looked better after further tune up. I discussed the operative procedure with the patient and his wife including alternatives, benefits and risks;  including but not limited to bleeding, blood transfusion, infection, stroke, myocardial infarction, graft failure, heart block requiring a permanent pacemaker, organ dysfunction, and death.  Thornton Papas understands and agrees to proceed.    Preparation:  The patient was seen in the preoperative holding area and the correct patient, correct operation were confirmed with the patient after reviewing the medical record and catheterization. The consent was signed by me. Preoperative antibiotics were given. A pulmonary arterial line and radial arterial line were placed by the anesthesia team. The patient was taken back to the operating room and positioned supine on the operating room table. After being placed under general endotracheal anesthesia by the anesthesia team a foley catheter was placed. The neck, chest, abdomen, and both legs were prepped with betadine soap and solution and draped in the usual sterile manner. A surgical time-out was taken and the correct patient and operative procedure were confirmed with the nursing and anesthesia staff.  TEE: performed by Dr. Roberts Gaudy.  This showed improved left ventricular ejection fraction of 45 to 50% on milrinone 0.25.  There is no significant valvular disease.  There was marked calcification of the posterior mitral annulus.  RV function is normal.  Cardiopulmonary Bypass:  A median sternotomy was performed. The pericardium was opened in the midline. Right ventricular function appeared normal. The ascending aorta was of normal size and had no palpable plaque. There were no contraindications to aortic cannulation or cross-clamping. The patient was fully systemically heparinized and the ACT was maintained > 400 sec. The proximal aortic arch was cannulated with a 10 F aortic cannula for arterial inflow. Venous cannulation was performed via the right atrial appendage using a two-staged venous cannula. An antegrade cardioplegia/vent cannula was inserted  into the mid-ascending aorta. Aortic occlusion was performed with a single cross-clamp. Systemic  cooling to 32 degrees Centigrade and topical cooling of the heart with iced saline were used. Hyperkalemic antegrade cold blood cardioplegia was used to induce diastolic arrest and was then given at about 20 minute intervals throughout the period of arrest to maintain myocardial temperature at or below 10 degrees centigrade. A temperature probe was inserted into the interventricular septum and an insulating pad was placed in the pericardium.   Left internal mammary harvest:  The left side of the sternum was retracted using the Rultract retractor. The left internal mammary artery was harvested as a pedicle graft. All side branches were clipped. It was a medium-sized vessel of good quality with excellent blood flow. It was ligated distally and divided. It was sprayed with topical papaverine solution to prevent vasospasm.   Endoscopic vein harvest:  The right greater saphenous vein was harvested endoscopically through a 2 cm incision medial to the right knee. It was harvested from the upper thigh to below the knee. It was a medium-sized vein of good quality. The side branches were all ligated with 4-0 silk ties.    Coronary arteries:  The coronary arteries were examined.   LAD:  Diffusely diseased but distal vessel suitable for grafting.  LCX:  Moderate sized OM that was diffusely diseased but graftable distally.  RCA:  Diffusely diseased but the distal vessel beyond the PDA was soft with minimal disease. The PDA and PL branches were small on cath and were deep in the epicardial fat.   Grafts:  1. LIMA to the LAD: 2.0 mm. It was sewn end to side using 8-0 prolene continuous suture. 2. SVG to OM:  1.6 mm distally. It was sewn end to side using 7-0 prolene continuous suture. 3. SVG to distal RCA:  2.5 mm. It was sewn end to side using 7-0 prolene continuous suture.   The proximal vein graft  anastomoses were performed to the mid-ascending aorta using continuous 6-0 prolene suture. Graft markers were placed around the proximal anastomoses.   Completion:  The patient was rewarmed to 37 degrees Centigrade. The clamp was removed from the LIMA pedicle and there was rapid warming of the septum and return of ventricular fibrillation. The crossclamp was removed with a time of 72 minutes. There was spontaneous return of sinus rhythm. The distal and proximal anastomoses were checked for hemostasis. The position of the grafts was satisfactory. Two temporary epicardial pacing wires were placed on the right atrium and two on the right ventricle. The patient was weaned from CPB without difficulty on milrinone 0.25 mcg/kg/min and epi 4 mcg. CPB time was 97 minutes. Cardiac output was 5 LPM. TEE showed improved LV systolic function.  Heparin was fully reversed with protamine and the aortic and venous cannulas removed. Hemostasis was achieved. Mediastinal and left pleural drainage tubes were placed. The sternum was closed with double #6 stainless steel wires. The fascia was closed with continuous # 1 vicryl suture. The subcutaneous tissue was closed with 2-0 vicryl continuous suture. The skin was closed with 3-0 vicryl subcuticular suture. All sponge, needle, and instrument counts were reported correct at the end of the case. Dry sterile dressings were placed over the incisions and around the chest tubes which were connected to pleurevac suction. The patient was then transported to the surgical intensive care unit in critical but stable condition.

## 2019-04-26 NOTE — Anesthesia Procedure Notes (Signed)
Procedure Name: Intubation Date/Time: 04/26/2019 7:41 AM Performed by: Neldon Newport, CRNA Pre-anesthesia Checklist: Timeout performed, Patient being monitored, Suction available, Emergency Drugs available and Patient identified Patient Re-evaluated:Patient Re-evaluated prior to induction Oxygen Delivery Method: Circle system utilized Preoxygenation: Pre-oxygenation with 100% oxygen Induction Type: IV induction Ventilation: Mask ventilation without difficulty, Two handed mask ventilation required and Oral airway inserted - appropriate to patient size Laryngoscope Size: Mac and 4 Grade View: Grade I Tube type: Oral Tube size: 8.0 mm Number of attempts: 1 Placement Confirmation: breath sounds checked- equal and bilateral,  positive ETCO2 and ETT inserted through vocal cords under direct vision Secured at: 23 cm Tube secured with: Tape Dental Injury: Teeth and Oropharynx as per pre-operative assessment

## 2019-04-26 NOTE — Progress Notes (Addendum)
[facility]  PROGRESS NOTE  WILBERTO HOYOS [id number] DOB: 1945/12/27 DOA: 04/12/2019 PCP: [name], MD  Brief History    Cameron Daniel is a 73 y.o. year old male with medical history significant for CAD, HTN, DM, Colon polyps, IBS, HLD, GERD with esophageal stricture, Diverticulosis, Depression, Anxiety former smoker  who presented on 04/12/2019 to [facility] with increased shortness of breath, cough and chills and was found to have acute hypoxic respiratory failure secondary to right lower lobe pneumonia requiring intubation due to worsening hypoxia/respiratory distress on 8/14.  Extubated on 8/15 but due to significant respiratory distress needed BiPAP.  After extubation patient again decompensated requiring BiPAP due to worsening respiratory symptoms, prompting further evaluation by TTE which showed reduced ejection fraction with wall motion on right heart cath/left heart cath on 8/18 found to have severe three-vessel disease elevated PCWP, cardiac index of 1.6.  At that time advanced heart failure was consulted to optimize for CABG.  Course complicated by retroperitoneal hematoma found on 8/20 during evaluation of persistent right groin pain and acute 2 g drop in hemoglobin.  Patient received 2 units packed red blood cells on 8/28 hemoglobin has improved 8.8 from 7.3 in the last 24 hours.   Subjective    Stable, postop day #0 CABG today 04/26/2019 Post op - Intubated, sedated with chest tubes   Currently hemodynamically stable.  We will sign off till he is X-ferd out   Assessment and plan    NSTEMI Severe three-vessel disease/CAD.  -Stable overnight, -Postop day #0 -Status post CABG 04/26/2019 Intubated- sedated ---- on the vent    - high-sensitivity troponin trend 136--150--1963.  -Right/left heart cath on 8/18 shows low cardiac output as well as severe three-vessel disease. - Needs further optimization of cardiac index/output currently on  milrinone as directed by advanced heart failure team.  -Continue medical management -Cardiology/CTS following   Hypotension.  -Holding BP meds as patient is mildly hypotensive postop  Closely monitor. Entresto discontinued due to low BP.   Retroperitoneal hematoma, stable. Acute on chronic anemia, improving.  -H&H remained stable, hematoma stable.  -Status post transfusion with 2 [place] PRBC, H&H monitoring remained to be stable -hemoglobin continues to improve from 7.3-8.8--10.9--10.3 >> 10.8 >>9.0  Continue to closely monitor.    CT abdomen showed moderate to large sized retroperitoneal hematoma.   Off heparin   Acute  Hyponatremia, continues to worsen.    -Improving -Sodium levels 126-125- -->128 today. -Concerning for over diuresing, mild dehydration -sodium stable with fluid restriction for presumed hyponatremia related to CHF/diminished cardiac output..  AHF started demclocycline  Hallucinations,resolved.   -Likely delirium, currently stable, improved Alert and oriented to person, place, time,   Leukocytosis, improving. -Likely reactive, due to acute bleeding, no signs of infection - continue to monitor.   -Patient remains afebrile, blood cultures if spikes fever, continue to closely monitor, no new medications likely contributing. -Completed 5 days course of antibiotic azithromycin/Rocephin, and 5 days of Levaquin total of 10 days of antibiotic coverage   Acute hypoxic respiratory failure, multifactorial secondary to community-acquired pneumonia and CHF with reduced EF,  -Remained stable improved shortness of breath, status post antibiotic treatment x5 days as it was/Rocephin  -  CT chest on 8/20 shows persistent  right-sided lower lobe consolidation from admission ( do not expect resolution of radiographic findings for several weeks), cough is stable, currently on room air with normal oxygen saturation, - complete additional 5 days of Levaquin to complete total 10  day  course of antibiotics.  -continue goal-directed medical therapy for CHF addressed below. - Encouraged IS/flutter valve.    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    -Stable, improved shortness of breath. EF of 25% on TTE with wall motion abnormalities, Per AHF hold diuresis given euvolemia and looks a bit dry, continue milrinone gtt. continue digoxin, holding Coreg while on milrinone, spironolactone, low dose entresto d/c'd due to low BP,  -Tolerating losartan,  Filed Weights   04/24/19 0600 04/25/19 0500 04/26/19 0226  Weight: 110.6 kg 109.7 kg 109.3 kg      Transaminitis stable -LFTs continue to improve  AST and ALT in the 70s and 60s respectively, with abdominal pain no focal abnormalities on liver/biliary exam on CT abdomen on admission.    AKI, . -Remained stable. -Monitor BUN/creatinine  -Creatinine bumped 1.35 >> 1.15 Likely due to HF/low cardiac output.  Brief hypotension, -=avoid nephrotoxins  Depression,  stable continue Celexa  Type 2 diabetes, well controlled, A1c 7 (02/15/2019)  -Continue CBG QA CHS, SSI -Increasing Lantus from 5 to 7 units today. -FBG > 200, holding home meds ,    BPH, stable continue Proscar  GERD, stable.  Continue PPI  Hyperlipidemia, stable continue simvastatin  Chronic right hip pain.  Continue supportive care with Tylenol, avoid NSAIDs given CAD   DVT prophylaxis: Start enoxaparin now that hgb stable Code Status: Full code Family Communication: No family at bedside Disposition Plan: Continue milrinone drip to improve cardiac index,  Hold lasix, Levaquin to completed, monitoring kidney function, Na, BP, possible CABG next week as he improves clinically,     04/26/2019, 1:40 PM  LOS: 14 days   Consultants  . PCCM, cardiology, CT surgery, advanced heart failure  Procedures  TTE; 04/13/2019   1. The left ventricle has severely reduced systolic function, with an ejection fraction of 25-30%. The cavity  size was normal. There is mildly increased left ventricular wall thickness. Left ventricular diastolic Doppler parameters are indeterminate. 2. Severe hypokinesis of the left ventricular, entire inferior wall and anteroseptal wall. 3. Left atrial size was mildly dilated. 4. Small pericardial effusion. 5. The pericardial effusion is posterior to the left ventricle. 6. The mitral valve is abnormal. Mild thickening of the mitral valve leaflet. There is mild to moderate mitral annular calcification present. 7. The aortic valve is tricuspid. Mild sclerosis of the aortic valve. No stenosis of the aortic valve. 8. The aorta is normal unless otherwise noted. 9. The inferior vena cava was dilated in size with <50% respiratory variability. 10. The interatrial septum was not assessed.    Antibiotics  . Levaquin . Ceftriaxone 8/14-8/18 . Azithromycin 8/14-8/18 . Zosyn 8/14  Interval History/Subjective  No cough. Feels very well. Wants to have a BM  Objective   Vitals:  Vitals:   04/26/19 0331 04/26/19 1255  BP: 110/61 (!) 102/41  Pulse: 84 89  Resp: (!) 26 19  Temp: 98 F (36.7 C)   SpO2:  96%    Exam: BP (!) 102/41   Pulse 89   Temp 98 F (36.7 C)   Resp 19   Ht 5\' 11"  (1.803 m)   Wt 109.3 kg   SpO2 96%   BMI 33.61 kg/m    Physical Exam  Constitution: Postop sedated  HEENT: Normocephalic, ET tube in place  Chest: chest tubes, Chest symmetric, chest dressing in place Cardio vascular:  S1/S2, RRR, No murmure, No Rubs or Gallops  pulmonary: + auscultation bilaterally BL,ET on the  vent  Abdomen: Soft, r, non-distendedno masses, no organomegaly Muscular skeletal: limited,  Neuro: sedated  Skin: Large hematoma, flank area no changes.  Mild tenderness at the site.  Dry, warm to touch, negative for any Rashes, No open wounds Wounds: Postop sternal surgical wound.     I have personally reviewed the following:   Data Reviewed: Basic Metabolic Panel: Recent Labs   Lab 04/22/19 0411 04/23/19 0415 04/24/19 0334 04/25/19 0420 04/26/19 0208 04/26/19 0751 04/26/19 0929 04/26/19 1002 04/26/19 1035 04/26/19 1110 04/26/19 1148  NA 126* 125* 128* 128* 128* 127* 127* 130* 127* 127* 128*  K 4.5 4.5 4.8 4.8 4.5 4.3 4.4 4.4 4.5 4.8 4.3  CL 89* 92* 93* 92* 91*  --   --   --   --   --   --   CO2 27 25 26 28 27   --   --   --   --   --   --   GLUCOSE 198* 209* 166* 178* 167* 145* 145*  --  128* 127* 163*  BUN 15 22 16 15 14   --   --   --   --   --   --   CREATININE 1.09 1.35* 1.15 1.15 0.98  --   --   --   --   --   --   CALCIUM 9.5 8.9 9.7 9.7 9.7  --   --   --   --   --   --    Liver Function Tests: Recent Labs  Lab 04/20/19 0415 04/21/19 0546 04/22/19 0411 04/23/19 0415 04/24/19 0334  AST 73* 76* 64* 61* 72*  ALT 56* 64* 61* 56* 64*  ALKPHOS 129* 144* 144* 154* 170*  BILITOT 1.8* 2.1* 2.5* 2.1* 2.2*  PROT 5.6* 5.5* 5.3* 4.8* 5.5*  ALBUMIN 2.6* 2.5* 2.4* 2.3* 2.5*   No results for input(s): LIPASE, AMYLASE in the last 168 hours. No results for input(s): AMMONIA in the last 168 hours. CBC: Recent Labs  Lab 04/23/19 0415 04/24/19 0334 04/25/19 0420 04/26/19 0208  04/26/19 1035 04/26/19 1047 04/26/19 1110 04/26/19 1148 04/26/19 1252  WBC 12.7* 10.7* 11.3* 9.7  --   --   --   --   --  19.1*  HGB 10.4* 10.8* 11.1* 11.0*   < > 7.8* 8.0* 7.5* 8.2* 9.0*  HCT 30.8* 32.7* 34.4* 33.3*   < > 23.0* 24.4* 22.0* 24.0* 27.1*  MCV 89.3 90.3 91.5 89.8  --   --   --   --   --  90.6  PLT 218 268 270 278  --   --  215  --   --  248   < > = values in this interval not displayed.   Cardiac Enzymes: No results for input(s): CKTOTAL, CKMB, CKMBINDEX, TROPONINI in the last 168 hours. BNP (last 3 results) Recent Labs    04/12/19 1647  BNP 359.3*    ProBNP (last 3 results) No results for input(s): PROBNP in the last 8760 hours.  CBG: Recent Labs  Lab 04/25/19 0627 04/25/19 1109 04/25/19 1633 04/25/19 2050 04/26/19 0530  GLUCAP 156* 165* 215*  155* 153*    No results found for this or any previous visit (from the past 240 hour(s)).   Studies: Dg Chest Port 1 View  Result Date: 04/26/2019 CLINICAL DATA:  Status post coronary bypass graft. EXAM: PORTABLE CHEST 1 VIEW COMPARISON:  Radiograph of same day. FINDINGS: Mild cardiomegaly is noted. Endotracheal and nasogastric tubes are unchanged  in position. Left-sided chest tube is noted without pneumothorax. Right internal jugular Swan-Ganz catheter is noted with tip directed into right pulmonary artery. No pleural effusion is noted. Right lung is clear. Minimal left basilar subsegmental atelectasis is noted. Right-sided PICC line is noted. Bony thorax is unremarkable. IMPRESSION: Endotracheal and nasogastric tubes are in grossly good position. Left-sided chest tube is noted without pneumothorax. Minimal left basilar subsegmental atelectasis is noted. Electronically Signed   By: Marijo Conception M.D.   On: 04/26/2019 13:15   Dg Chest Port 1 View  Result Date: 04/26/2019 CLINICAL DATA:  Preop for CABG EXAM: PORTABLE CHEST 1 VIEW COMPARISON:  04/15/2019 FINDINGS: The heart size and mediastinal contours are within normal limits. Both lungs are clear. The visualized skeletal structures are unremarkable. IMPRESSION: No active disease. Electronically Signed   By: Ulyses Jarred M.D.   On: 04/26/2019 01:20    Scheduled Meds: . [START ON 04/27/2019] acetaminophen  1,000 mg Oral Q6H   Or  . [START ON 04/27/2019] acetaminophen (TYLENOL) oral liquid 160 mg/5 mL  1,000 mg Per Tube Q6H  . [START ON 04/27/2019] aspirin EC  325 mg Oral Daily   Or  . [START ON 04/27/2019] aspirin  324 mg Per Tube Daily  . [START ON 04/27/2019] bisacodyl  10 mg Oral Daily   Or  . [START ON 04/27/2019] bisacodyl  10 mg Rectal Daily  . [START ON 04/27/2019] citalopram  10 mg Oral Daily  . [START ON 04/27/2019] docusate sodium  200 mg Oral Daily  . [START ON 04/27/2019] finasteride  5 mg Oral Daily  . insulin regular  0-10 Units  Intravenous TID WC  . metoprolol tartrate  12.5 mg Oral BID   Or  . metoprolol tartrate  12.5 mg Per Tube BID  . [START ON 04/28/2019] pantoprazole  40 mg Oral Daily  . [START ON 04/27/2019] rosuvastatin  40 mg Oral q1800  . [START ON 04/27/2019] sodium chloride flush  3 mL Intravenous Q12H   Continuous Infusions: . sodium chloride 20 mL/hr at 04/26/19 1255  . [START ON 04/27/2019] sodium chloride    . sodium chloride    . albumin human    . dexmedetomidine (PRECEDEX) IV infusion 0.7 mcg/kg/hr (04/26/19 1253)  . epinephrine    . famotidine (PEPCID) IV 20 mg (04/26/19 1255)  . insulin 4.1 Units/hr (04/26/19 1255)  . lactated ringers    . lactated ringers    . lactated ringers    . [START ON 04/27/2019] levofloxacin (LEVAQUIN) IV    . magnesium sulfate    . milrinone 0.25 mcg/kg/min (04/26/19 0300)  . nitroGLYCERIN    . phenylephrine (NEO-SYNEPHRINE) Adult infusion    . potassium chloride    . vancomycin        SIGNED: Deatra James, MD, FACP, FHM. [facility],  Pager [phone]  If 7PM-7AM, please contact night-coverage [email or web address], Password [facility] 04/24/2019, 10:18 AM

## 2019-04-26 NOTE — Progress Notes (Signed)
Progress Note  [name] Date of Encounter: 04/26/2019  Primary Cardiologist: [name], MD   Subjective   Underwent CABG x 3 today   Left internal mammary graft to the LAD  SVG to OM  SVG to distal RCA  Remains intubated. On milrinone 0.25 and Epi 2  Swan numbers look good.   CVP 8 PA 28/18 (22) CO 5.8/2.4  EF 45-50% on intra-op TEE  Inpatient Medications    Scheduled Meds: . [START ON 04/27/2019] acetaminophen  1,000 mg Oral Q6H   Or  . [START ON 04/27/2019] acetaminophen (TYLENOL) oral liquid 160 mg/5 mL  1,000 mg Per Tube Q6H  . [START ON 04/27/2019] aspirin EC  325 mg Oral Daily   Or  . [START ON 04/27/2019] aspirin  324 mg Per Tube Daily  . [START ON 04/27/2019] bisacodyl  10 mg Oral Daily   Or  . [START ON 04/27/2019] bisacodyl  10 mg Rectal Daily  . Chlorhexidine Gluconate Cloth  6 each Topical Daily  . [START ON 04/27/2019] citalopram  10 mg Oral Daily  . [START ON 04/27/2019] docusate sodium  200 mg Oral Daily  . [START ON 04/27/2019] finasteride  5 mg Oral Daily  . insulin regular  0-10 Units Intravenous TID WC  . metoprolol tartrate  12.5 mg Oral BID   Or  . metoprolol tartrate  12.5 mg Per Tube BID  . [START ON 04/28/2019] pantoprazole  40 mg Oral Daily  . [START ON 04/27/2019] rosuvastatin  40 mg Oral q1800  . [START ON 04/27/2019] sodium chloride flush  3 mL Intravenous Q12H   Continuous Infusions: . sodium chloride Stopped (04/26/19 1630)  . [START ON 04/27/2019] sodium chloride    . sodium chloride 20 mL/hr at 04/26/19 1255  . albumin human    . dexmedetomidine (PRECEDEX) IV infusion Stopped (04/26/19 1436)  . epinephrine 2 mcg/min (04/26/19 1700)  . famotidine (PEPCID) IV Stopped (04/26/19 1405)  . insulin 2.1 Units/hr (04/26/19 1712)  . lactated ringers    . lactated ringers 10 mL/hr at 04/26/19 1626  . lactated ringers 10 mL/hr at 04/26/19 1700  . [START ON 04/27/2019] levofloxacin (LEVAQUIN) IV    . magnesium  sulfate 20 mL/hr at 04/26/19 1700  . milrinone 0.25 mcg/kg/min (04/26/19 1700)  . nitroGLYCERIN    . norepinephrine (LEVOPHED) Adult infusion 8 mcg/min (04/26/19 1700)  . phenylephrine (NEO-SYNEPHRINE) Adult infusion Stopped (04/26/19 1530)  . potassium chloride 50 mL/hr at 04/26/19 1700  . vancomycin     PRN Meds: sodium chloride, albumin human, lactated ringers, metoprolol tartrate, midazolam, morphine injection, ondansetron (ZOFRAN) IV, oxyCODONE, [START ON 04/27/2019] sodium chloride flush, traMADol   Vital Signs    Vitals:   04/26/19 1630 04/26/19 1645 04/26/19 1700 04/26/19 1715  BP: 95/61 99/66 104/62   Pulse: 95 94 94 95  Resp: (!) 26 (!) 25 (!) 23 (!) 25  Temp: 97.9 F (36.6 C) 97.9 F (36.6 C) 97.9 F (36.6 C) 97.9 F (36.6 C)  TempSrc: Core (Comment) Core (Comment) Core (Comment) Core  SpO2: 99% 100% 100% 100%  Weight:      Height:        Intake/Output Summary (Last 24 hours) at 04/26/2019 1729 Last data filed at 04/26/2019 1700 Gross per 24 hour  Intake 5876.07 ml  Output 3520 ml  Net 2356.07 ml   Last 3 Weights 04/26/2019 04/25/2019 04/24/2019  Weight (lbs) 240 lb 15.4 oz 241 lb 13.5 oz 243 lb 13.3 oz  Weight (kg) 109.3 kg 109.7 kg 110.6 kg      Telemetry    NSR 90-105.- Personally Reviewed  ECG    No new EKG to review today- Personally Reviewed  Physical Exam   General:  Intubated/sedated HEENT: normal Neck: supple. RIJ swan. Carotids 2+ bilat; no bruits. No lymphadenopathy or thryomegaly appreciated. Cor: Sternal dressing ok. + CTs Regular rate & rhythm. Lungs: coarse Abdomen: soft, nontender, mildly distended. No hepatosplenomegaly. No bruits or masses. quiet. Extremities: no cyanosis, clubbing, rash, 1+ edema Neuro: intubated/sedatedpleasant   Labs    High Sensitivity Troponin:   Recent Labs  Lab 04/12/19 1647 04/12/19 1826 04/12/19 2352  TROPONINIHS 136* 150* 1,963*      Chemistry Recent Labs  Lab 04/22/19 0411 04/23/19 0415  04/24/19 0334 04/25/19 0420 04/26/19 0208  04/26/19 1110 04/26/19 1148 04/26/19 1317 04/26/19 1321  NA 126* 125* 128* 128* 128*   < > 127* 128* 130* 131*  K 4.5 4.5 4.8 4.8 4.5   < > 4.8 4.3 3.8 3.9  CL 89* 92* 93* 92* 91*  --   --   --   --   --   CO2 _0 --   --   --   --   --   GLUCOSE 198* 209* 166* 178* 167*   < > 127* 163* 176*  --   BUN _1 --   --   --   --   --   CREATININE 1.09 1.35* 1.15 1.15 0.98  --   --   --   --   --   CALCIUM 9.5 8.9 9.7 9.7 9.7  --   --   --   --   --   PROT 5.3* 4.8* 5.5*  --   --   --   --   --   --   --   ALBUMIN 2.4* 2.3* 2.5*  --   --   --   --   --   --   --   AST 64* 61* 72*  --   --   --   --   --   --   --   ALT 61* 56* 64*  --   --   --   --   --   --   --   ALKPHOS 144* 154* 170*  --   --   --   --   --   --   --   BILITOT 2.5* 2.1* 2.2*  --   --   --   --   --   --   --   GFRNONAA >60 52* >60 >60 >60  --   --   --   --   --   GFRAA >60 >60 >60 >60 >60  --   --   --   --   --   ANIONGAP _2 --   --   --   --   --    < > = values in this interval not displayed.     Hematology Recent Labs  Lab 04/25/19 0420 04/26/19 0208  04/26/19 1047  04/26/19 1252 04/26/19 1317 04/26/19 1321  WBC 11.3* 9.7  --   --   --  19.1*  --   --   RBC 3.76* 3.71*  --   --   --  2.99*  --   --  HGB 11.1* 11.0*   < > 8.0*   < > 9.0* 9.5* 12.9*  HCT 34.4* 33.3*   < > 24.4*   < > 27.1* 28.0* 38.0*  MCV 91.5 89.8  --   --   --  90.6  --   --   MCH 29.5 29.6  --   --   --  30.1  --   --   MCHC 32.3 33.0  --   --   --  33.2  --   --   RDW 16.8* 16.6*  --   --   --  16.5*  --   --   PLT 270 278  --  215  --  248  --   --    < > = values in this interval not displayed.    BNPNo results for input(s): BNP, PROBNP in the last 168 hours.   DDimer No results for input(s): DDIMER in the last 168 hours.   Radiology    Dg Chest Port 1 View  Result Date: 04/26/2019 CLINICAL DATA:  Status post coronary bypass graft. EXAM:  PORTABLE CHEST 1 VIEW COMPARISON:  Radiograph of same day. FINDINGS: Mild cardiomegaly is noted. Endotracheal and nasogastric tubes are unchanged in position. Left-sided chest tube is noted without pneumothorax. Right internal jugular Swan-Ganz catheter is noted with tip directed into right pulmonary artery. No pleural effusion is noted. Right lung is clear. Minimal left basilar subsegmental atelectasis is noted. Right-sided PICC line is noted. Bony thorax is unremarkable. IMPRESSION: Endotracheal and nasogastric tubes are in grossly good position. Left-sided chest tube is noted without pneumothorax. Minimal left basilar subsegmental atelectasis is noted. Electronically Signed   By: Marijo Conception M.D.   On: 04/26/2019 13:15   Dg Chest Port 1 View  Result Date: 04/26/2019 CLINICAL DATA:  Preop for CABG EXAM: PORTABLE CHEST 1 VIEW COMPARISON:  04/15/2019 FINDINGS: The heart size and mediastinal contours are within normal limits. Both lungs are clear. The visualized skeletal structures are unremarkable. IMPRESSION: No active disease. Electronically Signed   By: Ulyses Jarred M.D.   On: 04/26/2019 01:20    Cardiac Studies   LHC 04/16/19 Procedures  RIGHT/LEFT HEART CATH AND CORONARY ANGIOGRAPHY  Conclusion   Severe three-vessel coronary artery disease involving the ostial proximal and mid LAD, the mid RCA, and the mid and distal circumflex. There is moderate but not angiographically significant left main disease.  Global hypokinesis with reduced EF less than 30% has been noted by echocardiography. Overall picture is consistent with ischemic cardiomyopathy, possibly worsened EF with stunned myocardium in the setting of severe three-vessel CAD and increased demand caused by pneumonia and intubation.  Hemodynamics suggest that volume status is stable. Cardiac output is low due to left ventricular hypocontractility.  RECOMMENDATIONS:   Strong consideration for surgical revascularization once  medical status is improved.  Guideline directed therapy for left ventricular systolic dysfunction which should also include anti-ischemic therapy.  Preventive therapy with high intensity statin therapy.  Consider advanced heart failure team assessment to help optimize clinical condition.  Debate whether viability imaging is necessary.   Right Heart  Right Heart Pressures Hemodynamic findings consistent with mild pulmonary hypertension. LV EDP is normal.  Wall Motion  Resting    LVEF less than 30% by echo with global hypokinesis noted.        Coronary Diagrams  Diagnostic Dominance: Right  Intervention - awaiting CABG   2D Echo 04/13/19 IMPRESSIONS   1. The left ventricle has  severely reduced systolic function, with an ejection fraction of 25-30%. The cavity size was normal. There is mildly increased left ventricular wall thickness. Left ventricular diastolic Doppler parameters are indeterminate. 2. Severe hypokinesis of the left ventricular, entire inferior wall and anteroseptal wall. 3. Left atrial size was mildly dilated. 4. Small pericardial effusion. 5. The pericardial effusion is posterior to the left ventricle. 6. The mitral valve is abnormal. Mild thickening of the mitral valve leaflet. There is mild to moderate mitral annular calcification present. 7. The aortic valve is tricuspid. Mild sclerosis of the aortic valve. No stenosis of the aortic valve. 8. The aorta is normal unless otherwise noted. 9. The inferior vena cava was dilated in size with <50% respiratory variability. 10. The interatrial septum was not assessed.  SUMMARY  Very technically difficult study, despite definity contrast. LVEF 25-30%, global hypokinesis with severe inferior and anteroseptal hypokinesis, indeterminate diastolic function, elevated LV filling pressure, mild LAE, MAC with mild MR, aortic valve sclerosis, dilated IVC, small posterior pericardial effusion.    Patient Profile     73 y.o. male w/ known CAD s/p MI 30 years ago w/ RCA PCI, who presented initially to [facility] w/ acute hypoxic respiratory failure requiring intubation. CTA c/w RLL PNA. COVID negative. Hs Troponin c/w NSTEMI. 2D echo showed reduced LVEF at 25-30% w/ WMA. Subsequent LHC showed severe 3V CAD. Post cath recovery complicated by retroperitoneal bleed. IV heparin discontinued. S/p transfusion. Now awaiting CABG  Assessment & Plan    1. CAD/ NSTEMI:  - hs Troponin peaked at 1,963. Cardiac cath 04/16/19 showed severe three-vessel coronary artery disease involving the ostial proximal and mid LAD, the mid RCA, and the mid and distal circumflex.  - initial EF 25-30% - s/p CABG x 3 on  8/28 (LIMA to LAD, SVG to OM, SVG to distal RCA) - Remains intubated post-op. BP and rhythm stable   2. Acute Systolic CHF/ Ischemic Cardiomyopathy:  - initial EF 25-30%. Treated with milrinone - intra-op TEE 8/28 EF 45-50% - hemodynamics look good post-op on milrinone 0.25 and epi 2. Wean as tolerated - diurese post-op  3. Retroperitoneal Bleed/ Acute Blood Loss Anemia:  - Hgb dropped from 9.1>>7.3. Require blood transfusion. Hgb improved  4. Hyponatremia: -Na has improved but remains low, up from 125>>131 -Continue to follow  -Restrict free water.    5. DM:  - on insulin and SSI  - will need SGLT2i  6. Lipids: on statin, Crestor 40 mg nightly.  Last lipid panel in June showed LDL at 59 mg/dL. Despite good lipid control w/ LDL < 70, he still has severe multivessel CAD. Consider more aggressive lipid lowering therapy to drive LDL down to < 50 mg/dL. Can adjust regimen post discharge. Consider later addition of Zetia vs PCSK9 inhibitor.   7. AKI: - improved. Scr down from 1.35>>1.15 - watch post-op .   D/w Dr. Cyndia Bent.   CRITICAL CARE Performed by: Glori Bickers  Total critical care time: 35 minutes  Critical care time was exclusive of separately billable procedures and treating  other patients.  Critical care was necessary to treat or prevent imminent or life-threatening deterioration.  Critical care was time spent personally by me (independent of midlevel providers or residents) on the following activities: development of treatment plan with patient and/or surrogate as well as nursing, discussions with consultants, evaluation of patient's response to treatment, examination of patient, obtaining history from patient or surrogate, ordering and performing treatments and interventions, ordering and review of laboratory studies, ordering  and review of radiographic studies, pulse oximetry and re-evaluation of patient's condition.    For questions or updates, please contact [place] Please consult [email or web address] for contact info under     Glori Bickers, MD  04/26/2019, 5:29 PM

## 2019-04-27 ENCOUNTER — Inpatient Hospital Stay (HOSPITAL_COMMUNITY): Payer: Medicare HMO

## 2019-04-27 LAB — BASIC METABOLIC PANEL
Anion gap: 9 (ref 5–15)
Anion gap: 10 (ref 5–15)
BUN: 12 mg/dL (ref 8–23)
BUN: 16 mg/dL (ref 8–23)
CO2: 24 mmol/L (ref 22–32)
CO2: 25 mmol/L (ref 22–32)
Calcium: 9.1 mg/dL (ref 8.9–10.3)
Calcium: 9.2 mg/dL (ref 8.9–10.3)
Chloride: 97 mmol/L — ABNORMAL LOW (ref 98–111)
Chloride: 93 mmol/L — ABNORMAL LOW (ref 98–111)
Creatinine, Ser: 0.88 mg/dL (ref 0.61–1.24)
Creatinine, Ser: 1.1 mg/dL (ref 0.61–1.24)
GFR calc Af Amer: >60 mL/min (ref >60)
GFR calc Af Amer: >60 mL/min (ref >60)
GFR calc non Af Amer: >60 mL/min (ref >60)
GFR calc non Af Amer: >60 mL/min (ref >60)
Glucose, Bld: 108 mg/dL — ABNORMAL HIGH (ref 70–99)
Glucose, Bld: 169 mg/dL — ABNORMAL HIGH (ref 70–99)
Potassium: 4.6 mmol/L (ref 3.5–5.1)
Potassium: 4.6 mmol/L (ref 3.5–5.1)
Sodium: 130 mmol/L — ABNORMAL LOW (ref 135–145)
Sodium: 128 mmol/L — ABNORMAL LOW (ref 135–145)

## 2019-04-27 LAB — POCT I-STAT, CHEM 8
BUN: 16 mg/dL (ref 8–23)
Calcium, Ion: 1.3 mmol/L (ref 1.15–1.40)
Chloride: 92 mmol/L — ABNORMAL LOW (ref 98–111)
Creatinine, Ser: 1.1 mg/dL (ref 0.61–1.24)
Glucose, Bld: 161 mg/dL — ABNORMAL HIGH (ref 70–99)
HCT: 27 % — ABNORMAL LOW (ref 39.0–52.0)
Hemoglobin: 9.2 g/dL — ABNORMAL LOW (ref 13.0–17.0)
Potassium: 4.6 mmol/L (ref 3.5–5.1)
Sodium: 128 mmol/L — ABNORMAL LOW (ref 135–145)
TCO2: 25 mmol/L (ref 22–32)

## 2019-04-27 LAB — GLUCOSE, CAPILLARY
Glucose-Capillary: 104 mg/dL — ABNORMAL HIGH (ref 70–99)
Glucose-Capillary: 104 mg/dL — ABNORMAL HIGH (ref 70–99)
Glucose-Capillary: 106 mg/dL — ABNORMAL HIGH (ref 70–99)
Glucose-Capillary: 106 mg/dL — ABNORMAL HIGH (ref 70–99)
Glucose-Capillary: 110 mg/dL — ABNORMAL HIGH (ref 70–99)
Glucose-Capillary: 115 mg/dL — ABNORMAL HIGH (ref 70–99)
Glucose-Capillary: 120 mg/dL — ABNORMAL HIGH (ref 70–99)
Glucose-Capillary: 120 mg/dL — ABNORMAL HIGH (ref 70–99)
Glucose-Capillary: 129 mg/dL — ABNORMAL HIGH (ref 70–99)
Glucose-Capillary: 138 mg/dL — ABNORMAL HIGH (ref 70–99)
Glucose-Capillary: 144 mg/dL — ABNORMAL HIGH (ref 70–99)
Glucose-Capillary: 146 mg/dL — ABNORMAL HIGH (ref 70–99)
Glucose-Capillary: 146 mg/dL — ABNORMAL HIGH (ref 70–99)
Glucose-Capillary: 147 mg/dL — ABNORMAL HIGH (ref 70–99)
Glucose-Capillary: 154 mg/dL — ABNORMAL HIGH (ref 70–99)
Glucose-Capillary: 160 mg/dL — ABNORMAL HIGH (ref 70–99)
Glucose-Capillary: 163 mg/dL — ABNORMAL HIGH (ref 70–99)
Glucose-Capillary: 164 mg/dL — ABNORMAL HIGH (ref 70–99)
Glucose-Capillary: 165 mg/dL — ABNORMAL HIGH (ref 70–99)
Glucose-Capillary: 166 mg/dL — ABNORMAL HIGH (ref 70–99)
Glucose-Capillary: 168 mg/dL — ABNORMAL HIGH (ref 70–99)
Glucose-Capillary: 189 mg/dL — ABNORMAL HIGH (ref 70–99)
Glucose-Capillary: 190 mg/dL — ABNORMAL HIGH (ref 70–99)
Glucose-Capillary: 194 mg/dL — ABNORMAL HIGH (ref 70–99)
Glucose-Capillary: 98 mg/dL (ref 70–99)
Glucose-Capillary: 98 mg/dL (ref 70–99)

## 2019-04-27 LAB — COOXEMETRY PANEL
Carboxyhemoglobin: 2 % — ABNORMAL HIGH (ref 0.5–1.5)
Methemoglobin: 0.7 % (ref 0.0–1.5)
O2 Saturation: 53.9 %
Total hemoglobin: 9 g/dL — ABNORMAL LOW (ref 12.0–16.0)

## 2019-04-27 LAB — CBC
HCT: 27.8 % — ABNORMAL LOW (ref 39.0–52.0)
HCT: 27.1 % — ABNORMAL LOW (ref 39.0–52.0)
Hemoglobin: 8.9 g/dL — ABNORMAL LOW (ref 13.0–17.0)
Hemoglobin: 8.6 g/dL — ABNORMAL LOW (ref 13.0–17.0)
MCH: 29.5 pg (ref 26.0–34.0)
MCH: 29.7 pg (ref 26.0–34.0)
MCHC: 32 g/dL (ref 30.0–36.0)
MCHC: 31.7 g/dL (ref 30.0–36.0)
MCV: 92.1 fL (ref 80.0–100.0)
MCV: 93.4 fL (ref 80.0–100.0)
Platelets: 258 10*3/uL (ref 150–400)
Platelets: 195 10*3/uL (ref 150–400)
RBC: 3.02 MIL/uL — ABNORMAL LOW (ref 4.22–5.81)
RBC: 2.9 MIL/uL — ABNORMAL LOW (ref 4.22–5.81)
RDW: 16.9 % — ABNORMAL HIGH (ref 11.5–15.5)
RDW: 17 % — ABNORMAL HIGH (ref 11.5–15.5)
WBC: 13.7 10*3/uL — ABNORMAL HIGH (ref 4.0–10.5)
WBC: 10.5 10*3/uL (ref 4.0–10.5)
nRBC: 0 % (ref 0.0–0.2)
nRBC: 0 % (ref 0.0–0.2)

## 2019-04-27 LAB — MAGNESIUM
Magnesium: 2.2 mg/dL (ref 1.7–2.4)
Magnesium: 1.7 mg/dL (ref 1.7–2.4)

## 2019-04-27 MED ORDER — ALBUMIN HUMAN 5 % IV SOLN
12.5000 g | Freq: Once | INTRAVENOUS | Status: AC
  Administered 2019-04-27: 12.5 g via INTRAVENOUS
  Filled 2019-04-27: qty 250

## 2019-04-27 MED ORDER — ENOXAPARIN SODIUM 40 MG/0.4ML ~~LOC~~ SOLN
40.0000 mg | Freq: Every day | SUBCUTANEOUS | Status: DC
  Administered 2019-04-27 – 2019-04-29 (×3): 40 mg via SUBCUTANEOUS
  Filled 2019-04-27 (×3): qty 0.4

## 2019-04-27 MED ORDER — SODIUM CHLORIDE 0.9% FLUSH
10.0000 mL | Freq: Two times a day (BID) | INTRAVENOUS | Status: DC
  Administered 2019-04-27 – 2019-04-30 (×6): 10 mL via INTRAVENOUS

## 2019-04-27 MED ORDER — INSULIN ASPART 100 UNIT/ML ~~LOC~~ SOLN
3.0000 [IU] | Freq: Three times a day (TID) | SUBCUTANEOUS | Status: DC
  Administered 2019-04-28 – 2019-04-30 (×5): 3 [IU] via SUBCUTANEOUS

## 2019-04-27 MED ORDER — INSULIN ASPART 100 UNIT/ML ~~LOC~~ SOLN
0.0000 [IU] | SUBCUTANEOUS | Status: DC
  Administered 2019-04-27 (×2): 2 [IU] via SUBCUTANEOUS
  Administered 2019-04-27: 4 [IU] via SUBCUTANEOUS
  Administered 2019-04-28 (×4): 2 [IU] via SUBCUTANEOUS

## 2019-04-27 MED ORDER — FUROSEMIDE 10 MG/ML IJ SOLN
40.0000 mg | Freq: Once | INTRAMUSCULAR | Status: AC
  Administered 2019-04-27: 40 mg via INTRAVENOUS
  Filled 2019-04-27: qty 4

## 2019-04-27 MED ORDER — SODIUM CHLORIDE 0.9% FLUSH
10.0000 mL | INTRAVENOUS | Status: DC | PRN
  Administered 2019-04-28: 10 mL via INTRAVENOUS
  Filled 2019-04-27: qty 10

## 2019-04-27 MED ORDER — INSULIN DETEMIR 100 UNIT/ML ~~LOC~~ SOLN
30.0000 [IU] | Freq: Two times a day (BID) | SUBCUTANEOUS | Status: DC
  Administered 2019-04-27 – 2019-04-28 (×4): 30 [IU] via SUBCUTANEOUS
  Filled 2019-04-27 (×7): qty 0.3

## 2019-04-27 NOTE — Progress Notes (Signed)
      [address]       [place] [address]             [phone]      Asleep currently   BP (!) 104/53   Pulse 95   Temp 98.7 F (37.1 C) (Oral)   Resp (!) 32   Ht 5\' 11"  (1.803 m)   Wt 115.7 kg   SpO2 93%   BMI 35.57 kg/m   Intake/Output Summary (Last 24 hours) at 04/27/2019 1824 Last data filed at 04/27/2019 1700 Gross per 24 hour  Intake 2661.2 ml  Output 1539 ml  Net 1122.2 ml   CBG 104 to 147 K= 4.6, creatinine 1.10 Hct=27  Doing well POD # 1   Cameron Daniel C. Roxan Hockey, MD [facility] [phone]

## 2019-04-27 NOTE — Progress Notes (Signed)
1 Day Post-Op Procedure(s) (LRB): CORONARY ARTERY BYPASS GRAFTING (CABG) x Three, using left internal mammary artery and right leg greater saphenous vein harvested endoscopically (N/A) TRANSESOPHAGEAL ECHOCARDIOGRAM (TEE) (N/A) Subjective: Some discomfort  Objective: Vital signs in last 24 hours: Temp:  [97.7 F (36.5 C)-99.3 F (37.4 C)] 99 F (37.2 C) (08/29 0615) Pulse Rate:  [88-110] 94 (08/29 0615) Cardiac Rhythm: Normal sinus rhythm (08/29 0800) Resp:  [12-35] 30 (08/29 0615) BP: (82-122)/(41-96) 110/60 (08/29 0600) SpO2:  [94 %-100 %] 98 % (08/29 0615) Arterial Line BP: (68-150)/(38-57) 124/45 (08/29 0615) FiO2 (%):  [40 %-50 %] 40 % (08/28 1828) Weight:  [115.7 kg] 115.7 kg (08/29 0500)  Hemodynamic parameters for last 24 hours: PAP: (23-56)/(8-20) 30/14 CO:  [4.1 L/min-6.7 L/min] 5.1 L/min CI:  [1.8 L/min/m2-3 L/min/m2] 2.3 L/min/m2  Intake/Output from previous day: 08/28 0701 - 08/29 0700 In: 7613 [P.O.:660; I.V.:4126.4; Blood:480; IV Piggyback:1846.6] Out: 3699 [Urine:2365; Emesis/NG output:10; Blood:1100; Chest Tube:224] Intake/Output this shift: Total I/O In: 107.4 [I.V.:107.4] Out: 45 [Urine:45]  General appearance: alert, cooperative and no distress Neurologic: intact Heart: regular rate and rhythm Lungs: diminished breath sounds bibasilar Abdomen: benign  Lab Results: Recent Labs    04/26/19 1816  04/26/19 2001 04/27/19 0402  WBC 17.9*  --   --  13.7*  HGB 10.0*   < > 10.9* 8.9*  HCT 30.7*   < > 32.0* 27.8*  PLT 305  --   --  258   < > = values in this interval not displayed.   BMET:  Recent Labs    04/26/19 1816  04/26/19 2001 04/27/19 0402  NA 129*   < > 129* 130*  K 5.2*   < > 5.2* 4.6  CL 96*  --   --  97*  CO2 24  --   --  24  GLUCOSE 167*  --   --  108*  BUN 11  --   --  12  CREATININE 0.93  --   --  0.88  CALCIUM 9.1  --   --  9.1   < > = values in this interval not displayed.    PT/INR:  Recent Labs    04/26/19 1252   LABPROT 17.3*  INR 1.4*   ABG    Component Value Date/Time   PHART 7.379 04/26/2019 2001   HCO3 25.7 04/26/2019 2001   TCO2 27 04/26/2019 2001   ACIDBASEDEF 3.0 (H) 04/26/2019 1321   O2SAT 53.9 04/27/2019 0420   CBG (last 3)  Recent Labs    04/27/19 0306 04/27/19 0409 04/27/19 0459  GLUCAP 104* 115* 110*    Assessment/Plan: S/P Procedure(s) (LRB): CORONARY ARTERY BYPASS GRAFTING (CABG) x Three, using left internal mammary artery and right leg greater saphenous vein harvested endoscopically (N/A) TRANSESOPHAGEAL ECHOCARDIOGRAM (TEE) (N/A) -\CV- index Ok on milrinone 0.25, co-ox a little low earlier  Continue milrinone  Off epi RESP_ IS RENAL- creatinine and lytes OK, UO a little low, low filling pressures  Give albumin + lasix to stimulate diuresis ENDO_ CBG well controlled with insulin drip   Transition to levemir + SSI Anemia secondary to ABL- follow SCD + enoxaparin for DVT prophylaxis Cardiac rehab Dc chest tubes   LOS: 15 days    Cameron Daniel 04/27/2019

## 2019-04-28 ENCOUNTER — Inpatient Hospital Stay (HOSPITAL_COMMUNITY): Payer: Medicare HMO

## 2019-04-28 LAB — BASIC METABOLIC PANEL
Anion gap: 7 (ref 5–15)
BUN: 18 mg/dL (ref 8–23)
CO2: 27 mmol/L (ref 22–32)
Calcium: 9.1 mg/dL (ref 8.9–10.3)
Chloride: 93 mmol/L — ABNORMAL LOW (ref 98–111)
Creatinine, Ser: 0.92 mg/dL (ref 0.61–1.24)
GFR calc Af Amer: >60 mL/min (ref >60)
GFR calc non Af Amer: >60 mL/min (ref >60)
Glucose, Bld: 137 mg/dL — ABNORMAL HIGH (ref 70–99)
Potassium: 4.5 mmol/L (ref 3.5–5.1)
Sodium: 127 mmol/L — ABNORMAL LOW (ref 135–145)

## 2019-04-28 LAB — CBC
HCT: 26.7 % — ABNORMAL LOW (ref 39.0–52.0)
Hemoglobin: 8.6 g/dL — ABNORMAL LOW (ref 13.0–17.0)
MCH: 29.8 pg (ref 26.0–34.0)
MCHC: 32.2 g/dL (ref 30.0–36.0)
MCV: 92.4 fL (ref 80.0–100.0)
Platelets: 196 10*3/uL (ref 150–400)
RBC: 2.89 MIL/uL — ABNORMAL LOW (ref 4.22–5.81)
RDW: 16.9 % — ABNORMAL HIGH (ref 11.5–15.5)
WBC: 9.4 10*3/uL (ref 4.0–10.5)
nRBC: 0 % (ref 0.0–0.2)

## 2019-04-28 LAB — GLUCOSE, CAPILLARY
Glucose-Capillary: 133 mg/dL — ABNORMAL HIGH (ref 70–99)
Glucose-Capillary: 111 mg/dL — ABNORMAL HIGH (ref 70–99)
Glucose-Capillary: 121 mg/dL — ABNORMAL HIGH (ref 70–99)
Glucose-Capillary: 131 mg/dL — ABNORMAL HIGH (ref 70–99)
Glucose-Capillary: 133 mg/dL — ABNORMAL HIGH (ref 70–99)

## 2019-04-28 LAB — COOXEMETRY PANEL
Carboxyhemoglobin: 2.5 % — ABNORMAL HIGH (ref 0.5–1.5)
Methemoglobin: 0.7 % (ref 0.0–1.5)
O2 Saturation: 55.8 %
Total hemoglobin: 8.7 g/dL — ABNORMAL LOW (ref 12.0–16.0)

## 2019-04-28 MED ORDER — FUROSEMIDE 10 MG/ML IJ SOLN
40.0000 mg | Freq: Once | INTRAMUSCULAR | Status: AC
  Administered 2019-04-28: 40 mg via INTRAVENOUS
  Filled 2019-04-28: qty 4

## 2019-04-28 NOTE — Progress Notes (Signed)
Pt Rt IJ sleeve and TLC D/C'd according to Nursing protocol. Both removed intact and without resistance. Pressure held until hemostasis note. Site dressed with petroleum gauze and 4x4 secured in place. Pt tolerated procedure without difficulty.

## 2019-04-28 NOTE — Addendum Note (Signed)
Addendum  created 04/28/19 1543 by Roberts Gaudy, MD   Clinical Note Signed

## 2019-04-28 NOTE — Progress Notes (Signed)
Anesthesiology Follow-up:  Awake and alert, neuro intact, hemodynamically stable on low dose milrinone. Taking PO well, ambulating with assistance  VS: T- 36.5 BP- 109/58 HR- 94 (SR) RR- 22 O2 sat 97% on RA  K-4.5 BUN/Cr- 18/0.92 glucose- 137 H/H- 8.6/26.7 Platelets- 196,000 Co-ox 56%  Extubated 6 hours post-op.  73 year old male with ischemic cardiomyopathy, S/P non-Stemi with CHF and pneumonia. Now 2 days S/P CABG X 3.  Clinically stable, no apparent complications.  Cameron Daniel

## 2019-04-28 NOTE — Progress Notes (Signed)
2 Days Post-Op Procedure(s) (LRB): CORONARY ARTERY BYPASS GRAFTING (CABG) x Three, using left internal mammary artery and right leg greater saphenous vein harvested endoscopically (N/A) TRANSESOPHAGEAL ECHOCARDIOGRAM (TEE) (N/A) Subjective: C/o incisional pain, denies nausea  Objective: Vital signs in last 24 hours: Temp:  [97.8 F (36.6 C)-98.8 F (37.1 C)] 98.7 F (37.1 C) (08/30 0725) Pulse Rate:  [86-103] 103 (08/30 0800) Cardiac Rhythm: Normal sinus rhythm (08/29 2000) Resp:  [18-32] 21 (08/30 0800) BP: (95-119)/(53-68) 119/65 (08/30 0800) SpO2:  [91 %-100 %] 93 % (08/30 0800) Weight:  [114.8 kg] 114.8 kg (08/30 0600)  Hemodynamic parameters for last 24 hours:    Intake/Output from previous day: 08/29 0701 - 08/30 0700 In: 1247.8 [P.O.:420; I.V.:552.8; IV Piggyback:275] Out: 1225 [Urine:1165; Chest Tube:60] Intake/Output this shift: No intake/output data recorded.  General appearance: alert, cooperative and no distress Neurologic: intact Heart: regular rate and rhythm Lungs: diminished breath sounds bibasilar Abdomen: normal findings: soft, non-tender  Lab Results: Recent Labs    04/27/19 1647 04/27/19 1655 04/28/19 0320  WBC 10.5  --  9.4  HGB 8.6* 9.2* 8.6*  HCT 27.1* 27.0* 26.7*  PLT 195  --  196   BMET:  Recent Labs    04/27/19 1647 04/27/19 1655 04/28/19 0320  NA 128* 128* 127*  K 4.6 4.6 4.5  CL 93* 92* 93*  CO2 25  --  27  GLUCOSE 169* 161* 137*  BUN 16 16 18   CREATININE 1.10 1.10 0.92  CALCIUM 9.2  --  9.1    PT/INR:  Recent Labs    04/26/19 1252  LABPROT 17.3*  INR 1.4*   ABG    Component Value Date/Time   PHART 7.379 04/26/2019 2001   HCO3 25.7 04/26/2019 2001   TCO2 25 04/27/2019 1655   ACIDBASEDEF 3.0 (H) 04/26/2019 1321   O2SAT 55.8 04/28/2019 0411   CBG (last 3)  Recent Labs    04/27/19 2339 04/28/19 0327 04/28/19 0716  GLUCAP 146* 133* 111*    Assessment/Plan: S/P Procedure(s) (LRB): CORONARY ARTERY BYPASS  GRAFTING (CABG) x Three, using left internal mammary artery and right leg greater saphenous vein harvested endoscopically (N/A) TRANSESOPHAGEAL ECHOCARDIOGRAM (TEE) (N/A) -POD # 2 CV- in SR, co-ox 56, will decrease milrinone to 0.125 RESP- continue IS for atelectasis RENAL- IV lasix this AM ENDO- CBG mildly elevated Anemia secondary to ABL- stable Cardiac rehab   LOS: 16 days    Melrose Nakayama 04/28/2019

## 2019-04-28 NOTE — Addendum Note (Signed)
Addendum  created 04/28/19 1502 by Roberts Gaudy, MD   Order list changed

## 2019-04-28 NOTE — Progress Notes (Signed)
Evening Rounds  Stable day  Resting quietly. Adequate O2 sat on RA. Stable VS on reduced milrinone dose. Maintaining SR.   Macarthur Critchley, PA-C

## 2019-04-29 LAB — BASIC METABOLIC PANEL
Anion gap: 8 (ref 5–15)
BUN: 19 mg/dL (ref 8–23)
CO2: 28 mmol/L (ref 22–32)
Calcium: 9.2 mg/dL (ref 8.9–10.3)
Chloride: 94 mmol/L — ABNORMAL LOW (ref 98–111)
Creatinine, Ser: 0.88 mg/dL (ref 0.61–1.24)
GFR calc Af Amer: >60 mL/min (ref >60)
GFR calc non Af Amer: >60 mL/min (ref >60)
Glucose, Bld: 85 mg/dL (ref 70–99)
Potassium: 3.9 mmol/L (ref 3.5–5.1)
Sodium: 130 mmol/L — ABNORMAL LOW (ref 135–145)

## 2019-04-29 LAB — CBC
HCT: 29.5 % — ABNORMAL LOW (ref 39.0–52.0)
Hemoglobin: 9.3 g/dL — ABNORMAL LOW (ref 13.0–17.0)
MCH: 29.4 pg (ref 26.0–34.0)
MCHC: 31.5 g/dL (ref 30.0–36.0)
MCV: 93.4 fL (ref 80.0–100.0)
Platelets: 242 10*3/uL (ref 150–400)
RBC: 3.16 MIL/uL — ABNORMAL LOW (ref 4.22–5.81)
RDW: 16.9 % — ABNORMAL HIGH (ref 11.5–15.5)
WBC: 9.9 10*3/uL (ref 4.0–10.5)
nRBC: 0 % (ref 0.0–0.2)

## 2019-04-29 LAB — COOXEMETRY PANEL
Carboxyhemoglobin: 2.6 % — ABNORMAL HIGH (ref 0.5–1.5)
Methemoglobin: 0.7 % (ref 0.0–1.5)
O2 Saturation: 67.4 %
Total hemoglobin: 10.1 g/dL — ABNORMAL LOW (ref 12.0–16.0)

## 2019-04-29 LAB — GLUCOSE, CAPILLARY
Glucose-Capillary: 57 mg/dL — ABNORMAL LOW (ref 70–99)
Glucose-Capillary: 69 mg/dL — ABNORMAL LOW (ref 70–99)
Glucose-Capillary: 79 mg/dL (ref 70–99)
Glucose-Capillary: 82 mg/dL (ref 70–99)
Glucose-Capillary: 87 mg/dL (ref 70–99)
Glucose-Capillary: 89 mg/dL (ref 70–99)
Glucose-Capillary: 96 mg/dL (ref 70–99)
Glucose-Capillary: 99 mg/dL (ref 70–99)

## 2019-04-29 MED ORDER — INSULIN DETEMIR 100 UNIT/ML ~~LOC~~ SOLN
30.0000 [IU] | Freq: Every day | SUBCUTANEOUS | Status: DC
  Administered 2019-04-29: 30 [IU] via SUBCUTANEOUS
  Filled 2019-04-29 (×2): qty 0.3

## 2019-04-29 MED ORDER — POTASSIUM CHLORIDE CRYS ER 20 MEQ PO TBCR
20.0000 meq | EXTENDED_RELEASE_TABLET | Freq: Three times a day (TID) | ORAL | Status: AC
  Administered 2019-04-29 (×3): 20 meq via ORAL
  Filled 2019-04-29 (×3): qty 1

## 2019-04-29 MED ORDER — FUROSEMIDE 10 MG/ML IJ SOLN
40.0000 mg | Freq: Once | INTRAMUSCULAR | Status: AC
  Administered 2019-04-29: 40 mg via INTRAVENOUS
  Filled 2019-04-29: qty 4

## 2019-04-29 MED ORDER — INSULIN ASPART 100 UNIT/ML ~~LOC~~ SOLN
0.0000 [IU] | Freq: Three times a day (TID) | SUBCUTANEOUS | Status: DC
  Administered 2019-05-01: 4 [IU] via SUBCUTANEOUS
  Administered 2019-05-01: 2 [IU] via SUBCUTANEOUS

## 2019-04-29 MED ORDER — SPIRONOLACTONE 12.5 MG HALF TABLET
12.5000 mg | ORAL_TABLET | Freq: Every day | ORAL | Status: DC
  Administered 2019-04-29 – 2019-05-02 (×4): 12.5 mg via ORAL
  Filled 2019-04-29 (×4): qty 1

## 2019-04-29 MED ORDER — GLIPIZIDE 5 MG PO TABS
5.0000 mg | ORAL_TABLET | Freq: Every day | ORAL | Status: DC
  Administered 2019-04-29 – 2019-04-30 (×2): 5 mg via ORAL
  Filled 2019-04-29 (×3): qty 1

## 2019-04-29 MED FILL — Thrombin (Recombinant) For Soln 20000 Unit: CUTANEOUS | Qty: 1 | Status: AC

## 2019-04-29 NOTE — Progress Notes (Signed)
Progress Note  [name] Date of Encounter: 04/29/2019  Primary Cardiologist: [name], MD   Subjective   Underwent CABG x 3 today on 8/28   Left internal mammary graft to the LAD  SVG to OM  SVG to distal RCA  On milrinone 0.125 overnight. Co-ox 67% Stopped this am.   Denies CP or SOB. Says his butt sore  EF 45-50% on intra-op TEE  Inpatient Medications    Scheduled Meds: . acetaminophen  1,000 mg Oral Q6H   Or  . acetaminophen (TYLENOL) oral liquid 160 mg/5 mL  1,000 mg Per Tube Q6H  . aspirin EC  325 mg Oral Daily   Or  . aspirin  324 mg Per Tube Daily  . bisacodyl  10 mg Oral Daily   Or  . bisacodyl  10 mg Rectal Daily  . Chlorhexidine Gluconate Cloth  6 each Topical Daily  . citalopram  10 mg Oral Daily  . docusate sodium  200 mg Oral Daily  . enoxaparin (LOVENOX) injection  40 mg Subcutaneous QHS  . finasteride  5 mg Oral Daily  . furosemide  40 mg Intravenous Once  . glipiZIDE  5 mg Oral QAC breakfast  . insulin aspart  0-24 Units Subcutaneous TID WC  . insulin aspart  3 Units Subcutaneous TID WC  . insulin detemir  30 Units Subcutaneous Daily  . insulin regular  0-10 Units Intravenous TID WC  . pantoprazole  40 mg Oral Daily  . potassium chloride  20 mEq Oral TID  . rosuvastatin  40 mg Oral q1800  . sodium chloride flush  10 mL Intravenous Q12H   Continuous Infusions: . sodium chloride 10 mL/hr at 04/27/19 1100  . sodium chloride    . sodium chloride 20 mL/hr at 04/26/19 1255  . lactated ringers 10 mL/hr at 04/26/19 1626   PRN Meds: sodium chloride, morphine injection, ondansetron (ZOFRAN) IV, oxyCODONE, sodium chloride flush, traMADol   Vital Signs    Vitals:   04/29/19 0400 04/29/19 0513 04/29/19 0600 04/29/19 0700  BP: 110/65 102/67 108/66 104/67  Pulse: 92 97 94 94  Resp: (!) 22 (!) 28 (!) 24 (!) 26  Temp: (!) 97.5 F (36.4 C)     TempSrc: Oral     SpO2: 97% 98% 95% 95%  Weight:   113.3 kg   Height:         Intake/Output Summary (Last 24 hours) at 04/29/2019 0902 Last data filed at 04/29/2019 0819 Gross per 24 hour  Intake 1396.02 ml  Output 2001 ml  Net -604.98 ml   Last 3 Weights 04/29/2019 04/28/2019 04/27/2019  Weight (lbs) 249 lb 12.5 oz 253 lb 1.4 oz 255 lb  Weight (kg) 113.3 kg 114.8 kg 115.667 kg      Telemetry    NSR 90s.Personally reviewed   ECG    No new EKG to review today- Personally Reviewed  Physical Exam   General:  Sitting in chair No resp difficulty HEENT: normal Neck: supple. no JVD. Carotids 2+ bilat; no bruits. No lymphadenopathy or thryomegaly appreciated. Cor: PMI nondisplaced. Sternal incision looks good. Regular rate & rhythm. No rubs, gallops or murmurs. Lungs: clear Abdomen: soft, nontender, nondistended. No hepatosplenomegaly. No bruits or masses. Good bowel sounds. Extremities: no cyanosis, clubbing, rash, edema Neuro: alert & orientedx3, cranial nerves grossly intact. moves all 4 extremities w/o difficulty. Affect pleasant  Labs    High Sensitivity Troponin:   Recent Labs  Lab 04/12/19 1647 04/12/19  1826 04/12/19 2352  TROPONINIHS 136* 150* 1,963*      Chemistry Recent Labs  Lab 04/23/19 0415 04/24/19 0334  04/27/19 1647 04/27/19 1655 04/28/19 0320 04/29/19 0500  NA 125* 128*   < > 128* 128* 127* 130*  K 4.5 4.8   < > 4.6 4.6 4.5 3.9  CL 92* 93*   < > 93* 92* 93* 94*  CO2 25 26   < > 25  --  27 28  GLUCOSE 209* 166*   < > 169* 161* 137* 85  BUN 22 16   < > _0 CREATININE 1.35* 1.15   < > 1.10 1.10 0.92 0.88  CALCIUM 8.9 9.7   < > 9.2  --  9.1 9.2  PROT 4.8* 5.5*  --   --   --   --   --   ALBUMIN 2.3* 2.5*  --   --   --   --   --   AST 61* 72*  --   --   --   --   --   ALT 56* 64*  --   --   --   --   --   ALKPHOS 154* 170*  --   --   --   --   --   BILITOT 2.1* 2.2*  --   --   --   --   --   GFRNONAA 52* >60   < > >60  --  >60 >60  GFRAA >60 >60   < > >60  --  >60 >60  ANIONGAP 8 9   < > 10  --  7 8   < > =  values in this interval not displayed.     Hematology Recent Labs  Lab 04/27/19 1647 04/27/19 1655 04/28/19 0320 04/29/19 0500  WBC 10.5  --  9.4 9.9  RBC 2.90*  --  2.89* 3.16*  HGB 8.6* 9.2* 8.6* 9.3*  HCT 27.1* 27.0* 26.7* 29.5*  MCV 93.4  --  92.4 93.4  MCH 29.7  --  29.8 29.4  MCHC 31.7  --  32.2 31.5  RDW 17.0*  --  16.9* 16.9*  PLT 195  --  196 242    BNPNo results for input(s): BNP, PROBNP in the last 168 hours.   DDimer No results for input(s): DDIMER in the last 168 hours.   Radiology    Dg Chest Port 1 View  Result Date: 04/28/2019 CLINICAL DATA:  Three-vessel CABG EXAM: PORTABLE CHEST 1 VIEW COMPARISON:  Chest radiograph from earlier today. FINDINGS: Right PICC terminates in the middle third of the SVC. Intact sternotomy wires. Stable cardiomediastinal silhouette with mild cardiomegaly. No pneumothorax. No pleural effusion. Mild left basilar atelectasis, unchanged. No overt pulmonary edema. Low lung volumes. IMPRESSION: 1. No pneumothorax. 2. Low lung volumes with mild left basilar atelectasis, stable. 3. Mild cardiomegaly without overt pulmonary edema. Electronically Signed   By: Ilona Sorrel M.D.   On: 04/28/2019 12:05   Dg Chest Port 1 View  Result Date: 04/28/2019 CLINICAL DATA:  CABG.  Diabetes.  Hypertension. EXAM: PORTABLE CHEST 1 VIEW COMPARISON:  One day prior FINDINGS: Removal of right IJ Swan-Ganz catheter and left chest tubes. Median sternotomy. Right PICC line difficult to follow centrally but likely terminates at the mid SVC. Patient rotated right. Cardiomegaly accentuated by AP portable technique. No pleural fluid. No pneumothorax. No congestive failure. Diminished lung volumes with persistent areas of atelectasis in both lung bases.  IMPRESSION: Removal of support apparatus, without pneumothorax or other acute complication. Cardiomegaly with low lung volumes and scattered areas of atelectasis. Electronically Signed   By: Abigail Miyamoto M.D.   On: 04/28/2019  09:17    Cardiac Studies   LHC 04/16/19 Procedures  RIGHT/LEFT HEART CATH AND CORONARY ANGIOGRAPHY  Conclusion   Severe three-vessel coronary artery disease involving the ostial proximal and mid LAD, the mid RCA, and the mid and distal circumflex. There is moderate but not angiographically significant left main disease.  Global hypokinesis with reduced EF less than 30% has been noted by echocardiography. Overall picture is consistent with ischemic cardiomyopathy, possibly worsened EF with stunned myocardium in the setting of severe three-vessel CAD and increased demand caused by pneumonia and intubation.  Hemodynamics suggest that volume status is stable. Cardiac output is low due to left ventricular hypocontractility.  RECOMMENDATIONS:   Strong consideration for surgical revascularization once medical status is improved.  Guideline directed therapy for left ventricular systolic dysfunction which should also include anti-ischemic therapy.  Preventive therapy with high intensity statin therapy.  Consider advanced heart failure team assessment to help optimize clinical condition.  Debate whether viability imaging is necessary.   Right Heart  Right Heart Pressures Hemodynamic findings consistent with mild pulmonary hypertension. LV EDP is normal.  Wall Motion  Resting    LVEF less than 30% by echo with global hypokinesis noted.        Coronary Diagrams  Diagnostic Dominance: Right  Intervention - awaiting CABG   2D Echo 04/13/19 IMPRESSIONS   1. The left ventricle has severely reduced systolic function, with an ejection fraction of 25-30%. The cavity size was normal. There is mildly increased left ventricular wall thickness. Left ventricular diastolic Doppler parameters are indeterminate. 2. Severe hypokinesis of the left ventricular, entire inferior wall and anteroseptal wall. 3. Left atrial size was mildly dilated. 4. Small pericardial  effusion. 5. The pericardial effusion is posterior to the left ventricle. 6. The mitral valve is abnormal. Mild thickening of the mitral valve leaflet. There is mild to moderate mitral annular calcification present. 7. The aortic valve is tricuspid. Mild sclerosis of the aortic valve. No stenosis of the aortic valve. 8. The aorta is normal unless otherwise noted. 9. The inferior vena cava was dilated in size with <50% respiratory variability. 10. The interatrial septum was not assessed.  SUMMARY  Very technically difficult study, despite definity contrast. LVEF 25-30%, global hypokinesis with severe inferior and anteroseptal hypokinesis, indeterminate diastolic function, elevated LV filling pressure, mild LAE, MAC with mild MR, aortic valve sclerosis, dilated IVC, small posterior pericardial effusion.   Patient Profile     73 y.o. male w/ known CAD s/p MI 30 years ago w/ RCA PCI, who presented initially to [facility] w/ acute hypoxic respiratory failure requiring intubation. CTA c/w RLL PNA. COVID negative. Hs Troponin c/w NSTEMI. 2D echo showed reduced LVEF at 25-30% w/ WMA. Subsequent LHC showed severe 3V CAD. Post cath recovery complicated by retroperitoneal bleed. IV heparin discontinued. S/p transfusion. Now awaiting CABG  Assessment & Plan    1. CAD/ NSTEMI:  - hs Troponin peaked at 1,963. Cardiac cath 04/16/19 showed severe three-vessel coronary artery disease involving the ostial proximal and mid LAD, the mid RCA, and the mid and distal circumflex.  - initial EF 25-30% - s/p CABG x 3 on  8/28 (LIMA to LAD, SVG to OM, SVG to distal RCA) - Doing well post-op. Continue to ambulate  - start low-dose carvedilol in am if  stable - add spiro 12.5  2. Acute Systolic CHF/ Ischemic Cardiomyopathy:  - initial EF 25-30%. Treated with milrinone - intra-op TEE 8/28 EF 45-50% - co-ox 67%. Agree with stopping milrinone today - Weight well below pre-op  - Stop lasix  3. Retroperitoneal  Bleed/ Acute Blood Loss Anemia:  - Hgb stable at 9.3  4. Hyponatremia: -Na has improved but remains low. 130 today -Continue to follow  -Restrict free water.    5. DM:  - on insulin and SSI  - will need SGLT2i on d/c  6. Lipids: on statin, Crestor 40 mg nightly.  Last lipid panel in June showed LDL at 59 mg/dL. Despite good lipid control w/ LDL < 70, he still has severe multivessel CAD. Consider more aggressive lipid lowering therapy to drive LDL down to < 50 mg/dL. Can adjust regimen post discharge. Consider later addition of Zetia vs PCSK9 inhibitor.   7. AKI: - improved. Scr 0.88    For questions or updates, please contact [place] Please consult [email or web address] for contact info under     Glori Bickers, MD  04/29/2019, 9:02 AM

## 2019-04-29 NOTE — Progress Notes (Signed)
TCTS Evening Rounds  Pod #3 s/p CABG for low EF Stable day Diuresing well Exam WNL  A/p: continue diuresis  Cameron Daniel Z. Orvan Seen, Cameron Daniel

## 2019-04-29 NOTE — Progress Notes (Signed)
3 Days Post-Op Procedure(s) (LRB): CORONARY ARTERY BYPASS GRAFTING (CABG) x Three, using left internal mammary artery and right leg greater saphenous vein harvested endoscopically (N/A) TRANSESOPHAGEAL ECHOCARDIOGRAM (TEE) (N/A) Subjective: Only complaint is pain in butt from sitting in chair. Feels ok in bed but not in chair. Ambulated around ICU this am. Hope working.  Objective: Vital signs in last 24 hours: Temp:  [97.5 F (36.4 C)-98.3 F (36.8 C)] 97.5 F (36.4 C) (08/31 0400) Pulse Rate:  [87-106] 94 (08/31 0700) Cardiac Rhythm: Normal sinus rhythm (08/31 0400) Resp:  [8-30] 26 (08/31 0700) BP: (93-136)/(58-80) 104/67 (08/31 0700) SpO2:  [93 %-98 %] 95 % (08/31 0700) Weight:  [113.3 kg] 113.3 kg (08/31 0600)  Hemodynamic parameters for last 24 hours:    Intake/Output from previous day: 08/30 0701 - 08/31 0700 In: 1672.4 [P.O.:1320; I.V.:352.4] Out: 2200 [Urine:2200] Intake/Output this shift: No intake/output data recorded.  General appearance: alert and cooperative Neurologic: intact Heart: regular rate and rhythm, S1, S2 normal, no murmur, click, rub or gallop Lungs: clear to auscultation bilaterally Extremities: edema minmal Wound: incisions ok  Lab Results: Recent Labs    04/28/19 0320 04/29/19 0500  WBC 9.4 9.9  HGB 8.6* 9.3*  HCT 26.7* 29.5*  PLT 196 242   BMET:  Recent Labs    04/28/19 0320 04/29/19 0500  NA 127* 130*  K 4.5 3.9  CL 93* 94*  CO2 27 28  GLUCOSE 137* 85  BUN 18 19  CREATININE 0.92 0.88  CALCIUM 9.1 9.2    PT/INR:  Recent Labs    04/26/19 1252  LABPROT 17.3*  INR 1.4*   ABG    Component Value Date/Time   PHART 7.379 04/26/2019 2001   HCO3 25.7 04/26/2019 2001   TCO2 25 04/27/2019 1655   ACIDBASEDEF 3.0 (H) 04/26/2019 1321   O2SAT 67.4 04/29/2019 0511   CBG (last 3)  Recent Labs    04/29/19 0028 04/29/19 0433 04/29/19 0505  GLUCAP 79 69* 82    Assessment/Plan: S/P Procedure(s) (LRB): CORONARY ARTERY  BYPASS GRAFTING (CABG) x Three, using left internal mammary artery and right leg greater saphenous vein harvested endoscopically (N/A) TRANSESOPHAGEAL ECHOCARDIOGRAM (TEE) (N/A)  POD 3 Hemodynamically stable in sinus rhythm. Co-ox is 67% on milrinone 0.125. Will stop it today. See what medical regimen heart failure team wants to use. His EF was significantly improved with preop tune up and revascularization but was still on inotropes in the OR.  Mild volume excess. Wt is about 9 lbs over preop. Will diurese further.   DM: glucose under good control and was low of 69 this am. Will change Levemir to 30 units daily and resume glucotrol.  DC foley. May need condom cath.  PICC line out tomorrow if Co-ox ok off milrinone.  Continue IS, ambulation.   LOS: 17 days    Gaye Pollack 04/29/2019

## 2019-04-30 ENCOUNTER — Encounter (HOSPITAL_COMMUNITY): Payer: Self-pay | Admitting: Surgery

## 2019-04-30 LAB — COOXEMETRY PANEL
Carboxyhemoglobin: 2.9 % — ABNORMAL HIGH (ref 0.5–1.5)
Methemoglobin: 1.1 % (ref 0.0–1.5)
O2 Saturation: 62.7 %
Total hemoglobin: 10.8 g/dL — ABNORMAL LOW (ref 12.0–16.0)

## 2019-04-30 LAB — GLUCOSE, CAPILLARY
Glucose-Capillary: 84 mg/dL (ref 70–99)
Glucose-Capillary: 48 mg/dL — ABNORMAL LOW (ref 70–99)
Glucose-Capillary: 124 mg/dL — ABNORMAL HIGH (ref 70–99)
Glucose-Capillary: 98 mg/dL (ref 70–99)
Glucose-Capillary: 106 mg/dL — ABNORMAL HIGH (ref 70–99)

## 2019-04-30 LAB — BASIC METABOLIC PANEL
Anion gap: 9 (ref 5–15)
BUN: 23 mg/dL (ref 8–23)
CO2: 29 mmol/L (ref 22–32)
Calcium: 9.4 mg/dL (ref 8.9–10.3)
Chloride: 94 mmol/L — ABNORMAL LOW (ref 98–111)
Creatinine, Ser: 1.01 mg/dL (ref 0.61–1.24)
GFR calc Af Amer: >60 mL/min (ref >60)
GFR calc non Af Amer: >60 mL/min (ref >60)
Glucose, Bld: 98 mg/dL (ref 70–99)
Potassium: 4.4 mmol/L (ref 3.5–5.1)
Sodium: 132 mmol/L — ABNORMAL LOW (ref 135–145)

## 2019-04-30 MED ORDER — GLUCOSE 40 % PO GEL
ORAL | Status: AC
  Filled 2019-04-30: qty 1

## 2019-04-30 MED ORDER — DEXTROSE 50 % IV SOLN
INTRAVENOUS | Status: AC
  Administered 2019-04-30: 25 mL
  Filled 2019-04-30: qty 50

## 2019-04-30 MED ORDER — INSULIN DETEMIR 100 UNIT/ML ~~LOC~~ SOLN
20.0000 [IU] | Freq: Every day | SUBCUTANEOUS | Status: DC
  Administered 2019-04-30 – 2019-05-02 (×3): 20 [IU] via SUBCUTANEOUS
  Filled 2019-04-30 (×3): qty 0.2

## 2019-04-30 MED ORDER — CARVEDILOL 3.125 MG PO TABS
3.1250 mg | ORAL_TABLET | Freq: Two times a day (BID) | ORAL | Status: DC
  Administered 2019-04-30 – 2019-05-02 (×5): 3.125 mg via ORAL
  Filled 2019-04-30 (×6): qty 1

## 2019-04-30 MED FILL — Lidocaine HCl Local Soln Prefilled Syringe 100 MG/5ML (2%): INTRAMUSCULAR | Qty: 5 | Status: AC

## 2019-04-30 MED FILL — Potassium Chloride Inj 2 mEq/ML: INTRAVENOUS | Qty: 40 | Status: AC

## 2019-04-30 MED FILL — Sodium Bicarbonate IV Soln 8.4%: INTRAVENOUS | Qty: 50 | Status: AC

## 2019-04-30 MED FILL — Sodium Chloride IV Soln 0.9%: INTRAVENOUS | Qty: 2000 | Status: AC

## 2019-04-30 MED FILL — Magnesium Sulfate Inj 50%: INTRAMUSCULAR | Qty: 10 | Status: AC

## 2019-04-30 MED FILL — Electrolyte-R (PH 7.4) Solution: INTRAVENOUS | Qty: 4000 | Status: AC

## 2019-04-30 MED FILL — Heparin Sodium (Porcine) Inj 1000 Unit/ML: INTRAMUSCULAR | Qty: 20 | Status: AC

## 2019-04-30 MED FILL — Heparin Sodium (Porcine) Inj 1000 Unit/ML: INTRAMUSCULAR | Qty: 30 | Status: AC

## 2019-04-30 MED FILL — Calcium Chloride Inj 10%: INTRAVENOUS | Qty: 10 | Status: AC

## 2019-04-30 MED FILL — Mannitol IV Soln 20%: INTRAVENOUS | Qty: 500 | Status: AC

## 2019-04-30 NOTE — Discharge Summary (Signed)
[address]       [place] [address]             [phone]      Physician Discharge Summary  Patient ID: Cameron Daniel MRN: [id number] DOB/AGE: 72-Jan-1947 73 y.o.  Admit date: 04/12/2019 Discharge date: 05/02/2019  Admission Diagnoses:  Patient Active Problem List   Diagnosis Date Noted  . Hyponatremia 04/20/2019  . Retroperitoneal hematoma 04/18/2019  . Acute on chronic blood loss anemia 04/18/2019  . Acute systolic HF (heart failure) ([place]) 04/15/2019  . Non-ST elevation (NSTEMI) myocardial infarction ([place]) 04/15/2019  . AKI (acute kidney injury) ([place])   . Community acquired pneumonia of right lower lobe of lung ([place]) 04/12/2019  . Acute respiratory failure ([place]) 04/12/2019  . Allergic conjunctivitis 01/23/2018  . Chronic sinusitis 01/23/2018  . Gout 04/06/2012  . DIVERTICULITIS-COLON 06/18/2010  . FACIAL FLUSHING 12/11/2007  . NOCTURIA 12/11/2007  . Type 2 diabetes mellitus with hyperlipidemia ([place]) 09/07/2007  . OTHER MALAISE AND FATIGUE 09/07/2007  . Allergic rhinitis 08/16/2007  . HYPERLIPIDEMIA 01/24/2007  . OBESITY 01/24/2007  . ERECTILE DYSFUNCTION 01/24/2007  . HYPERTENSION 01/24/2007  . Coronary atherosclerosis 01/24/2007  . GERD 01/24/2007  . DIVERTICULOSIS, COLON 01/24/2007  . IRRITABLE BOWEL SYNDROME 01/24/2007  . COLONIC POLYPS, HX OF 01/24/2007  . CHOLECYSTECTOMY, HX OF 01/24/2007    Discharge Diagnoses:  Principal Problem:   Non-ST elevation (NSTEMI) myocardial infarction [facility]) Active Problems:   Type 2 diabetes mellitus with hyperlipidemia (H[facility])   Coronary atherosclerosis   GERD   Community acquired pneumonia of right lower lobe of lung ([place])   Acute respiratory failure (H[facility])   AKI (acute kidney injury) ([place])   Acute systolic HF (heart failure) (H[facility])   Retroperitoneal hematoma   Acute on chronic blood loss anemia   Hyponatremia   S/P CABG x 3   Discharged Condition: good  HPI:  The patient is a  73 year old gentleman with history of hypertension, diabetes, hyperlipidemia, coronary artery disease status post angioplasty in 1985 who was seen in the emergency room on 04/12/2019 for possible dehydration after working outside in the heat.  His wife said they spent about 7 hours in the ER was given intravenous fluids and sent home.  He returned with chills, fatigue, cough, and dyspnea and a CT scan of the chest showed right lower lobe pneumonia.  High-sensitivity troponin was mildly elevated at 136 and subsequent values were 150 and 1,963.  He developed progressive respiratory failure with hypoxia and tachypnea and required intubation in the emergency room.  He was seen by PCCM.  He was started on antibiotics.  A post intubation chest x-ray showed central pulmonary edema.  He was extubated the following day and did well initially and then developed acute respiratory distress requiring increased oxygen and diuresis.  He had a stat echocardiogram which showed ejection fraction of 25 to 30% although it was poor quality.  There is no significant valvular dysfunction.  He underwent cardiac catheterization today showing severe three-vessel coronary disease.  Results of his right heart catheterization are not in the cath note although says that there was mild pulmonary hypertension and normal LVEDP.  The patient's wife is with him today.  He denies any history of chest pain or pressure.  He said that he stays active working in his yard.  He has occasional episodes of exertional shortness of breath but stops to rest and it goes away and he resumes his activity.  He feels  like his energy level has been good.   Hospital Course:   On 05/27/2019 Mr. Filyaw underwent a CABG x 3 with Dr. Cyndia Bent.  He was transferred to the surgical ICU for continued care.  He was extubated in a timely manner.  Heart failure was consulted to assist with milrinone titration.  Intra-Op estimated ejection fraction was 45 to 50%.  Postop day  1 we were able to wean him off of his epinephrine.  We continued milrinone for now.  We gave some albumin and Lasix to stimulate diuresis.  We transitioned his insulin drip to Levemir and sliding scale insulin.  We discontinued his chest tubes.  We initiated Lovenox therapy for DVT prophylaxis.  Postop day 2 his co-ox was 56% therefore we able to decrease milrinone to 0.125.  He was given a dose of IV Lasix.  We continue to encourage use of his incentive spirometer.  Postop day 3 his only complaint was regarding his pain in his buttocks from sitting in the chair.  We continue diuresis due to fluid overload.  His co-ox increased to 67%, therefore we discontinued milrinone at this time.  We will continue to encourage ambulation and incentive spirometer use.  His Levemir dose was decreased due to hypoglycemia.  Postop day 4 he was off all of his drips.  He felt like he was recovering well.  His kidney function remained stable.  His Lasix was discontinued but he remained on spironolactone.  He was stable at this time to transfer to the stepdown unit for continued care.  On the stepdown unit, his two-view chest x-ray showed Decreased left basilar and right upper lobe atelectasis and interval minimal right basilar atelectasis.  Today, he is ambulating with limited assistance, tolerating room air, his incisions are healing well, and he is ready for discharge.  Consults: None  Significant Diagnostic Studies:   CLINICAL DATA:  Three-vessel CABG  EXAM: PORTABLE CHEST 1 VIEW  COMPARISON:  Chest radiograph from earlier today.  FINDINGS: Right PICC terminates in the middle third of the SVC. Intact sternotomy wires. Stable cardiomediastinal silhouette with mild cardiomegaly. No pneumothorax. No pleural effusion. Mild left basilar atelectasis, unchanged. No overt pulmonary edema. Low lung volumes.  IMPRESSION: 1. No pneumothorax. 2. Low lung volumes with mild left basilar atelectasis, stable. 3. Mild  cardiomegaly without overt pulmonary edema.   Electronically Signed   By: Ilona Sorrel M.D.   On: 04/28/2019 12:05  Treatments:  CARDIOVASCULAR SURGERY OPERATIVE NOTE  04/26/2019  Surgeon:  Gaye Pollack, MD  First Assistant: Nicholes Rough, PA-C   Preoperative Diagnosis:  Severe multi-vessel coronary artery disease   Postoperative Diagnosis:  Same   Procedure:  1. Median Sternotomy 2. Extracorporeal circulation 3.   Coronary artery bypass grafting x 3   Left internal mammary graft to the LAD  SVG to OM  SVG to distal RCA  4.   Endoscopic vein harvest from the right leg   Anesthesia:  General Endotracheal   Clinical History/Surgical Indication:  This 73 year old gentleman presented with right lower lobe pneumonia and ruled in for non-ST segment elevation MI and subsequently developed progressive hypoxemia and respiratory failure requiring intubation in the emergency room with a post intubation x-ray showing central pulmonary edema. His echocardiogram showed an ejection fraction of 25% although it was a poor quality study. Cardiac catheterization showed severe multivessel coronary disease with high-grade calcified stenoses in the LAD, left circumflex, and right coronary arteries. His coronary vessels are diffusely calcified and have a diabetic  vessel appearance.  Right heart catheterization showed compensated filling pressures with low cardiac output.  The patient was evaluated by Dr. [name] in the advanced heart failure team.  He was started on milrinone with improvement in his Co-ox.  It was felt that coronary bypass graft surgery was the best treatment for him.  Unfortunately he developed a retroperitoneal bleed after catheterization with a significant drop in his hematocrit requiring transfusion and discontinuation of anticoagulation.  He recovered from this and looked better after further tune up. I discussed the operative procedure with the  patient and his wife including alternatives, benefits and risks; including but not limited to bleeding, blood transfusion, infection, stroke, myocardial infarction, graft failure, heart block requiring a permanent pacemaker, organ dysfunction, and death.  Thornton Papas understands and agrees to proceed.   Discharge Exam: Blood pressure 110/76, pulse 92, temperature 97.8 F (36.6 C), temperature source Oral, resp. rate 18, height 5\' 11"  (1.803 m), weight 110.3 kg, SpO2 97 %.    General appearance: alert, cooperative and no distress Heart: regular rate and rhythm, S1, S2 normal, no murmur, click, rub or gallop Lungs: clear to auscultation bilaterally Abdomen: soft, non-tender; bowel sounds normal; no masses,  no organomegaly Extremities: extremities normal, atraumatic, no cyanosis or edema Wound: clean and dry   Disposition: Discharge disposition: 01-Home or Self Care        Allergies as of 05/02/2019      Reactions   Doxazosin Mesylate    UNKNOWN REACTION   Flomax [tamsulosin Hcl] Itching      Jardiance [empagliflozin] Nausea Only, Other (See Comments)   dizziness - Severe (per pt)   Penicillins Rash, Other (See Comments)   Has patient had a PCN reaction causing immediate rash, facial/tongue/throat swelling, SOB or lightheadedness with hypotension: No Has patient had a PCN reaction causing severe rash involving mucus membranes or skin necrosis: No Has patient had a PCN reaction that required hospitalization No Has patient had a PCN reaction occurring within the last 10 years: No If all of the above answers are "NO", then may proceed with Cephalosporin use.   Sulfonamide Derivatives Rash   Trulicity [dulaglutide] Nausea And Vomiting   N&V, Dizziness      Medication List    STOP taking these medications   aspirin 81 MG tablet Replaced by: aspirin 325 MG EC tablet   olmesartan 20 MG tablet Commonly known as: BENICAR   omega-3 acid ethyl esters 1 g capsule Commonly  known as: Lovaza     TAKE these medications   aspirin 325 MG EC tablet Take 1 tablet (325 mg total) by mouth daily. Replaces: aspirin 81 MG tablet   carvedilol 3.125 MG tablet Commonly known as: COREG Take 1 tablet (3.125 mg total) by mouth 2 (two) times daily with a meal.   citalopram 10 MG tablet Commonly known as: CELEXA Take 2 tablets (20 mg total) by mouth daily. What changed: how much to take   colestipol 5 g packet Commonly known as: COLESTID Take 5 g by mouth 2 (two) times daily.   fexofenadine 180 MG tablet Commonly known as: ALLEGRA Take 1 tablet (180 mg total) by mouth daily as needed. For allergies What changed:   reasons to take this  additional instructions   finasteride 5 MG tablet Commonly known as: PROSCAR Take 1 tablet (5 mg total) by mouth daily.   fluticasone 50 MCG/ACT nasal spray Commonly known as: FLONASE SPRAY 2 SPRAYS INTO EACH NOSTRIL EVERY DAY What changed: See the  new instructions.   glipiZIDE 5 MG tablet Commonly known as: GLUCOTROL TAKE 1 TABLET (5 MG TOTAL) BY MOUTH DAILY BEFORE BREAKFAST.   Multi-Vitamins Tabs Take 1 capsule by mouth daily.   oxyCODONE 5 MG immediate release tablet Commonly known as: Oxy IR/ROXICODONE Take 1 tablet (5 mg total) by mouth every 6 (six) hours as needed for severe pain.   pantoprazole 40 MG tablet Commonly known as: PROTONIX TAKE 1 TABLET (40 MG TOTAL) BY MOUTH EVERY MORNING. What changed:   when to take this  reasons to take this   pioglitazone 30 MG tablet Commonly known as: ACTOS Take 1 tablet (30 mg total) by mouth daily.   rosuvastatin 40 MG tablet Commonly known as: CRESTOR Take 1 tablet (40 mg total) by mouth daily at 6 PM. What changed:   medication strength  how much to take  when to take this   spironolactone 25 MG tablet Commonly known as: ALDACTONE Take 0.5 tablets (12.5 mg total) by mouth daily.   testosterone cypionate 200 MG/ML injection Commonly known as:  DEPOTESTOSTERONE CYPIONATE INJECT 1 ML EVERY 14 DAYS   Vitamin D3 25 MCG (1000 UT) Caps Take 1,000 Units by mouth daily.            Durable Medical Equipment  (From admission, onward)         Start     Ordered   05/02/19 0758  For home use only DME Bedside commode  Once    Question:  Patient needs a bedside commode to treat with the following condition  Answer:  Mobility impaired   05/01/19 0758         Follow-up Information    Gaye Pollack, MD Follow up.   Specialty: Cardiothoracic Surgery Why: Your routine follow-up appointment is on 9/30 @10 :30am. Please arrive at 10:00am for a chest xray located at [facility] which is located on the first floor of our building.  Contact information: [address] [place] [address] [phone]        [name], MD. Call in 1 day(s).   Specialty: Family Medicine Contact information: [address] [place] [address] [phone]        Dorothy Spark, MD .   Specialty: Cardiology Contact information: [address] [place] [address] [phone]        nursing suture appointment Follow up.   Why: Your suture removal appointment is on 9/10 @1100am  Contact information: Dr. Vivi Martens office       Idolina Primer, Nedra Hai, PA-C Follow up.   Specialties: Cardiology, Radiology Why: Your follow-up appointment is with Melina Copa, PA-C on 05/15/2019 @1 :45 pm ([facility]). Please bring your medication list.  Contact information: [address] [place] [address] [phone]        Jolaine Artist, MD. Go on 05/08/2019.   Specialty: Cardiology Why: 10:30 AM, [facility], Entrance C, parking code [place] information: [address] [address] [phone]          The patient has been discharged on:   1.Beta Blocker:  Yes [   ]                              No   [ no  ]  If No, reason: heart failure  2.Ace Inhibitor/ARB: Yes [   ]                                     No  [  no  ]                                     If No, reason: normotensive  3.Statin:   Yes [ yes  ]                  No  [   ]                  If No, reason:  4.Ecasa:  Yes  [  yes ]                  No   [   ]                  If No, reason:    Signed: Elgie Collard 05/02/2019, 8:17 AM

## 2019-04-30 NOTE — Progress Notes (Signed)
Inpatient Diabetes Program Recommendations  AACE/ADA: New Consensus Statement on Inpatient Glycemic Control  Target Ranges:  Prepandial:   less than 140 mg/dL      Peak postprandial:   less than 180 mg/dL (1-2 hours)      Critically ill patients:  140 - 180 mg/dL   Results for RHYETT, PEETE (MRN [id number]) as of 04/30/2019 08:09  Ref. Range 04/29/2019 05:05 04/29/2019 10:35 04/29/2019 11:49 04/29/2019 16:22 04/29/2019 17:19 04/29/2019 19:59 04/29/2019 22:09 04/30/2019 06:47  Glucose-Capillary Latest Ref Range: 70 - 99 mg/dL 82  Novolog 3 units @8 :15   Glipizide 5 mg  Levemir 30 units 89  Novolog 3 units 57 (L) [phone] 84  Glipizide 5 mg  Novolog 3 units   Review of Glycemic Control  Diabetes history: DM2 Outpatient Diabetes medications: Glipizide 5 mg QAM, Actos 30 mg daily Current orders for Inpatient glycemic control: Levemir 30 units daily, Novolog 0-24 units TID with meals, Novolog 3 units TID with meals for meal coverage, Glipizide 5 mg QAM  Inpatient Diabetes Program Recommendations:   Insulin-Basal: Noted Levemir was decreased from 30 units BID to 30 units daily yesterday and only received Levemir 30 units once on 04/29/19.  Insulin-Meal Coverage: Please consider discontinuing Novolog 3 units TID with meals.   Oral DM Medication: If patient has any other glucose levels less than 70 mg/dl, please consider discontinuing Glipizide for now. Patient has already received Glipizide this morning.   Thanks, Barnie Alderman, RN, MSN, CDE Diabetes Coordinator Inpatient Diabetes Program [phone] (Team Pager from 8am to 5pm)

## 2019-04-30 NOTE — Progress Notes (Signed)
Progress Note  [name] Date of Encounter: 04/30/2019  Primary Cardiologist: [name], MD   Subjective   Underwent CABG x 3 today on 8/28   Left internal mammary graft to the LAD  SVG to OM  SVG to distal RCA  Feels good. Had good BM. No CP or SOB. Milrinone stopped yesterday co-ox 63%  EF 45-50% on intra-op TEE  Inpatient Medications    Scheduled Meds: . acetaminophen  1,000 mg Oral Q6H   Or  . acetaminophen (TYLENOL) oral liquid 160 mg/5 mL  1,000 mg Per Tube Q6H  . aspirin EC  325 mg Oral Daily   Or  . aspirin  324 mg Per Tube Daily  . Chlorhexidine Gluconate Cloth  6 each Topical Daily  . citalopram  10 mg Oral Daily  . docusate sodium  200 mg Oral Daily  . finasteride  5 mg Oral Daily  . glipiZIDE  5 mg Oral QAC breakfast  . insulin aspart  0-24 Units Subcutaneous TID WC  . insulin detemir  20 Units Subcutaneous Daily  . pantoprazole  40 mg Oral Daily  . rosuvastatin  40 mg Oral q1800  . sodium chloride flush  10 mL Intravenous Q12H  . spironolactone  12.5 mg Oral Daily   Continuous Infusions: . sodium chloride    . lactated ringers 10 mL/hr at 04/26/19 1626   PRN Meds: morphine injection, ondansetron (ZOFRAN) IV, oxyCODONE, sodium chloride flush, traMADol   Vital Signs    Vitals:   04/30/19 0600 04/30/19 0700 04/30/19 0800 04/30/19 0900  BP: 99/70 106/63 111/61 116/69  Pulse: 90 87 90 92  Resp: (!) 23 (!) 24 (!) 21 (!) 24  Temp:      TempSrc:      SpO2: 97% 98% 98% 97%  Weight:      Height:        Intake/Output Summary (Last 24 hours) at 04/30/2019 1009 Last data filed at 04/30/2019 0900 Gross per 24 hour  Intake -  Output 2675 ml  Net -2675 ml   Last 3 Weights 04/30/2019 04/29/2019 04/28/2019  Weight (lbs) 243 lb 9.7 oz 249 lb 12.5 oz 253 lb 1.4 oz  Weight (kg) 110.5 kg 113.3 kg 114.8 kg      Telemetry    NSR 80-90s.Personally reviewed   ECG    No new EKG to review today- Personally Reviewed   Physical Exam   General:  Sitting up in bed No resp difficulty HEENT: normal Neck: supple. no JVD. Carotids 2+ bilat; no bruits. No lymphadenopathy or thryomegaly appreciated. Cor: Sternum ok PMI nondisplaced. Regular rate & rhythm. No rubs, gallops or murmurs. Lungs: clear Abdomen: soft, nontender, nondistended. No hepatosplenomegaly. No bruits or masses. Good bowel sounds. Extremities: no cyanosis, clubbing, rash, edema Neuro: alert & orientedx3, cranial nerves grossly intact. moves all 4 extremities w/o difficulty. Affect pleasant   Labs    High Sensitivity Troponin:   Recent Labs  Lab 04/12/19 1647 04/12/19 1826 04/12/19 2352  TROPONINIHS 136* 150* 1,963*      Chemistry Recent Labs  Lab 04/24/19 0334  04/27/19 1647 04/27/19 1655 04/28/19 0320 04/29/19 0500  NA 128*   < > 128* 128* 127* 130*  K 4.8   < > 4.6 4.6 4.5 3.9  CL 93*   < > 93* 92* 93* 94*  CO2 26   < > 25  --  27 28  GLUCOSE 166*   < > 169* 161* 137* 85  BUN 16   < > _0 CREATININE 1.15   < > 1.10 1.10 0.92 0.88  CALCIUM 9.7   < > 9.2  --  9.1 9.2  PROT 5.5*  --   --   --   --   --   ALBUMIN 2.5*  --   --   --   --   --   AST 72*  --   --   --   --   --   ALT 64*  --   --   --   --   --   ALKPHOS 170*  --   --   --   --   --   BILITOT 2.2*  --   --   --   --   --   GFRNONAA >60   < > >60  --  >60 >60  GFRAA >60   < > >60  --  >60 >60  ANIONGAP 9   < > 10  --  7 8   < > = values in this interval not displayed.     Hematology Recent Labs  Lab 04/27/19 1647 04/27/19 1655 04/28/19 0320 04/29/19 0500  WBC 10.5  --  9.4 9.9  RBC 2.90*  --  2.89* 3.16*  HGB 8.6* 9.2* 8.6* 9.3*  HCT 27.1* 27.0* 26.7* 29.5*  MCV 93.4  --  92.4 93.4  MCH 29.7  --  29.8 29.4  MCHC 31.7  --  32.2 31.5  RDW 17.0*  --  16.9* 16.9*  PLT 195  --  196 242    BNPNo results for input(s): BNP, PROBNP in the last 168 hours.   DDimer No results for input(s): DDIMER in the last 168 hours.   Radiology    Dg  Chest Port 1 View  Result Date: 04/28/2019 CLINICAL DATA:  Three-vessel CABG EXAM: PORTABLE CHEST 1 VIEW COMPARISON:  Chest radiograph from earlier today. FINDINGS: Right PICC terminates in the middle third of the SVC. Intact sternotomy wires. Stable cardiomediastinal silhouette with mild cardiomegaly. No pneumothorax. No pleural effusion. Mild left basilar atelectasis, unchanged. No overt pulmonary edema. Low lung volumes. IMPRESSION: 1. No pneumothorax. 2. Low lung volumes with mild left basilar atelectasis, stable. 3. Mild cardiomegaly without overt pulmonary edema. Electronically Signed   By: Ilona Sorrel M.D.   On: 04/28/2019 12:05    Cardiac Studies   LHC 04/16/19 Procedures  RIGHT/LEFT HEART CATH AND CORONARY ANGIOGRAPHY  Conclusion   Severe three-vessel coronary artery disease involving the ostial proximal and mid LAD, the mid RCA, and the mid and distal circumflex. There is moderate but not angiographically significant left main disease.  Global hypokinesis with reduced EF less than 30% has been noted by echocardiography. Overall picture is consistent with ischemic cardiomyopathy, possibly worsened EF with stunned myocardium in the setting of severe three-vessel CAD and increased demand caused by pneumonia and intubation.  Hemodynamics suggest that volume status is stable. Cardiac output is low due to left ventricular hypocontractility.  RECOMMENDATIONS:   Strong consideration for surgical revascularization once medical status is improved.  Guideline directed therapy for left ventricular systolic dysfunction which should also include anti-ischemic therapy.  Preventive therapy with high intensity statin therapy.  Consider advanced heart failure team assessment to help optimize clinical condition.  Debate whether viability imaging is necessary.   Right Heart  Right Heart Pressures Hemodynamic findings consistent with mild pulmonary hypertension. LV EDP is normal.   Wall Motion  Resting    LVEF less than 30% by echo with global hypokinesis noted.        Coronary Diagrams  Diagnostic Dominance: Right  Intervention - awaiting CABG   2D Echo 04/13/19 IMPRESSIONS   1. The left ventricle has severely reduced systolic function, with an ejection fraction of 25-30%. The cavity size was normal. There is mildly increased left ventricular wall thickness. Left ventricular diastolic Doppler parameters are indeterminate. 2. Severe hypokinesis of the left ventricular, entire inferior wall and anteroseptal wall. 3. Left atrial size was mildly dilated. 4. Small pericardial effusion. 5. The pericardial effusion is posterior to the left ventricle. 6. The mitral valve is abnormal. Mild thickening of the mitral valve leaflet. There is mild to moderate mitral annular calcification present. 7. The aortic valve is tricuspid. Mild sclerosis of the aortic valve. No stenosis of the aortic valve. 8. The aorta is normal unless otherwise noted. 9. The inferior vena cava was dilated in size with <50% respiratory variability. 10. The interatrial septum was not assessed.  SUMMARY  Very technically difficult study, despite definity contrast. LVEF 25-30%, global hypokinesis with severe inferior and anteroseptal hypokinesis, indeterminate diastolic function, elevated LV filling pressure, mild LAE, MAC with mild MR, aortic valve sclerosis, dilated IVC, small posterior pericardial effusion.   Patient Profile     73 y.o. male w/ known CAD s/p MI 30 years ago w/ RCA PCI, who presented initially to [facility] w/ acute hypoxic respiratory failure requiring intubation. CTA c/w RLL PNA. COVID negative. Hs Troponin c/w NSTEMI. 2D echo showed reduced LVEF at 25-30% w/ WMA. Subsequent LHC showed severe 3V CAD. Post cath recovery complicated by retroperitoneal bleed. IV heparin discontinued. S/p transfusion. Now awaiting CABG  Assessment & Plan    1. CAD/ NSTEMI:  -  hs Troponin peaked at 1,963. Cardiac cath 04/16/19 showed severe three-vessel coronary artery disease involving the ostial proximal and mid LAD, the mid RCA, and the mid and distal circumflex.  - initial EF 25-30% - s/p CABG x 3 on  8/28 (LIMA to LAD, SVG to OM, SVG to distal RCA) - Doing well post-op. Continue to ambulate    2. Acute Systolic CHF/ Ischemic Cardiomyopathy:  - initial EF 25-30%. Treated with milrinone - intra-op TEE 8/28 EF 45-50% - co-ox 62% off milrinone - Weight well below pre-op. Remains off lasix - start low-dose carvedilol  - continue spiro 12.5 - check bmet  3. Retroperitoneal Bleed/ Acute Blood Loss Anemia:  - Hgb stable at 10.8  4. Hyponatremia: -Na has improved but remains low. BMET pending -Continue to follow  -Restrict free water.    5. DM:  - on insulin and SSI  - will need SGLT2i on d/c  6. Lipids: on statin, Crestor 40 mg nightly.  Last lipid panel in June showed LDL at 59 mg/dL. Despite good lipid control w/ LDL < 70, he still has severe multivessel CAD. Consider more aggressive lipid lowering therapy to drive LDL down to < 50 mg/dL. Can adjust regimen post discharge. Consider later addition of Zetia vs PCSK9 inhibitor.   7. AKI: - improved. BMET pedning  Can go to floor. Probably home tomorrow.    For questions or updates, please contact [place] Please consult [email or web address] for contact info under     Glori Bickers, MD  04/30/2019, 10:09 AM

## 2019-04-30 NOTE — Progress Notes (Signed)
Hypoglycemic Event  CBG: 48  Treatment: Dextrose  25 mg  Symptoms: asymptomatic  Follow-up CBG: Time 1400 CBG Result: 125  Possible Reasons for Event: Levemir was reduced to 20 units but may still be too much coverage.  Comments/MD notified    Annie Sable

## 2019-04-30 NOTE — Progress Notes (Addendum)
      [address]       [place] [address]             [phone]      4 Days Post-Op Procedure(s) (LRB): CORONARY ARTERY BYPASS GRAFTING (CABG) x Three, using left internal mammary artery and right leg greater saphenous vein harvested endoscopically (N/A) TRANSESOPHAGEAL ECHOCARDIOGRAM (TEE) (N/A) Subjective: Feels good today. No complaints  Objective: Vital signs in last 24 hours: Temp:  [97.4 F (36.3 C)-98.7 F (37.1 C)] 97.6 F (36.4 C) (09/01 0503) Pulse Rate:  [87-101] 87 (09/01 0700) Cardiac Rhythm: Normal sinus rhythm (09/01 0505) Resp:  [18-33] 24 (09/01 0700) BP: (90-118)/(58-90) 106/63 (09/01 0700) SpO2:  [94 %-98 %] 98 % (09/01 0700) Weight:  [110.5 kg] 110.5 kg (09/01 0500)     Intake/Output from previous day: 08/31 0701 - 09/01 0700 In: 42.3 [I.V.:42.3] Out: 2476 [Urine:2475; Stool:1] Intake/Output this shift: No intake/output data recorded.  General appearance: alert, cooperative and no distress Heart: regular rate and rhythm, S1, S2 normal, no murmur, click, rub or gallop Lungs: clear to auscultation bilaterally Abdomen: soft, non-tender; bowel sounds normal; no masses,  no organomegaly Extremities: extremities normal, atraumatic, no cyanosis or edema Wound: clean and dry  Lab Results: Recent Labs    04/28/19 0320 04/29/19 0500  WBC 9.4 9.9  HGB 8.6* 9.3*  HCT 26.7* 29.5*  PLT 196 242   BMET:  Recent Labs    04/28/19 0320 04/29/19 0500  NA 127* 130*  K 4.5 3.9  CL 93* 94*  CO2 27 28  GLUCOSE 137* 85  BUN 18 19  CREATININE 0.92 0.88  CALCIUM 9.1 9.2    PT/INR: No results for input(s): LABPROT, INR in the last 72 hours. ABG    Component Value Date/Time   PHART 7.379 04/26/2019 2001   HCO3 25.7 04/26/2019 2001   TCO2 25 04/27/2019 1655   ACIDBASEDEF 3.0 (H) 04/26/2019 1321   O2SAT 62.7 04/30/2019 0514   CBG (last 3)  Recent Labs    04/29/19 1959 04/29/19 2209 04/30/19 0647  GLUCAP 96 87 84     Assessment/Plan: S/P Procedure(s) (LRB): CORONARY ARTERY BYPASS GRAFTING (CABG) x Three, using left internal mammary artery and right leg greater saphenous vein harvested endoscopically (N/A) TRANSESOPHAGEAL ECHOCARDIOGRAM (TEE) (N/A)  1. CV- Coox 62.7% this morning. Off milrinone. HR and BP well controlled.  Continue ASA and statin.  2. Pulm-tolerating room air with good oxygen saturation.  3. Renal-No BMET..will order. Creatinine 0.88 and electrolytes okay from yesterday. Continue Spironolactone. Lasix was discontinued. Weight continues to trend down. 4. H and H 9.3/29.5, expected acute blood loss anemia.  5. Endo-blood glucose remains low. Will change Levemir to 20 units daily.   Plan: Transfer to the step-down unit today. Feels good and recovering well.    LOS: 18 days    Elgie Collard 04/30/2019   Chart reviewed, patient examined, agree with above. He feels well and is ambulating well.  Bowels working Transfer to 4E and continue mobilization.  DC PICC line today. Pacing wires out tomorrow.

## 2019-04-30 NOTE — Progress Notes (Signed)
Patient ID: Cameron Daniel, male   DOB: 21-Dec-1945, 73 y.o.   MRN: [id number] EVENING ROUNDS NOTE :     [address]       [place] [address]             [phone]                 4 Days Post-Op Procedure(s) (LRB): CORONARY ARTERY BYPASS GRAFTING (CABG) x Three, using left internal mammary artery and right leg greater saphenous vein harvested endoscopically (N/A) TRANSESOPHAGEAL ECHOCARDIOGRAM (TEE) (N/A)  Total Length of Stay:  LOS: 18 days  BP 94/73   Pulse 87   Temp 97.8 F (36.6 C) (Oral)   Resp (!) 23   Ht 5\' 11"  (1.803 m)   Wt 110.5 kg   SpO2 98%   BMI 33.98 kg/m   .Intake/Output      08/31 0701 - 09/01 0700 09/01 0701 - 09/02 0700   P.O.  800   I.V. (mL/kg) 42.3 (0.4)    Total Intake(mL/kg) 42.3 (0.4) 800 (7.2)   Urine (mL/kg/hr) 2475 (0.9) 300 (0.3)   Stool 1    Total Output 2476 300   Net -2433.7 +500        Urine Occurrence 1 x      . sodium chloride    . lactated ringers 10 mL/hr at 04/26/19 1626     Lab Results  Component Value Date   WBC 9.9 04/29/2019   HGB 9.3 (L) 04/29/2019   HCT 29.5 (L) 04/29/2019   PLT 242 04/29/2019   GLUCOSE 98 04/30/2019   CHOL 145 02/15/2019   TRIG 260 (H) 02/15/2019   HDL 55 02/15/2019   LDLDIRECT 49.9 08/26/2013   LDLCALC 59 02/15/2019   ALT 64 (H) 04/24/2019   AST 72 (H) 04/24/2019   NA 132 (L) 04/30/2019   K 4.4 04/30/2019   CL 94 (L) 04/30/2019   CREATININE 1.01 04/30/2019   BUN 23 04/30/2019   CO2 29 04/30/2019   TSH 2.142 04/21/2019   PSA 2.0 02/15/2019   INR 1.4 (H) 04/26/2019   HGBA1C 6.7 (H) 04/26/2019   MICROALBUR 1.5 02/15/2019   Stable day ,waiting for step down bed   [name] MD  Beeper [phone] Office [phone] 04/30/2019 4:57 PM

## 2019-05-01 ENCOUNTER — Inpatient Hospital Stay (HOSPITAL_COMMUNITY): Payer: Medicare HMO

## 2019-05-01 LAB — BASIC METABOLIC PANEL
Anion gap: 10 (ref 5–15)
BUN: 20 mg/dL (ref 8–23)
CO2: 27 mmol/L (ref 22–32)
Calcium: 9.3 mg/dL (ref 8.9–10.3)
Chloride: 95 mmol/L — ABNORMAL LOW (ref 98–111)
Creatinine, Ser: 1 mg/dL (ref 0.61–1.24)
GFR calc Af Amer: >60 mL/min (ref >60)
GFR calc non Af Amer: >60 mL/min (ref >60)
Glucose, Bld: 135 mg/dL — ABNORMAL HIGH (ref 70–99)
Potassium: 4.2 mmol/L (ref 3.5–5.1)
Sodium: 132 mmol/L — ABNORMAL LOW (ref 135–145)

## 2019-05-01 LAB — CBC
HCT: 31.4 % — ABNORMAL LOW (ref 39.0–52.0)
Hemoglobin: 10.1 g/dL — ABNORMAL LOW (ref 13.0–17.0)
MCH: 30.1 pg (ref 26.0–34.0)
MCHC: 32.2 g/dL (ref 30.0–36.0)
MCV: 93.5 fL (ref 80.0–100.0)
Platelets: 254 10*3/uL (ref 150–400)
RBC: 3.36 MIL/uL — ABNORMAL LOW (ref 4.22–5.81)
RDW: 17.7 % — ABNORMAL HIGH (ref 11.5–15.5)
WBC: 6.4 10*3/uL (ref 4.0–10.5)
nRBC: 0 % (ref 0.0–0.2)

## 2019-05-01 LAB — GLUCOSE, CAPILLARY
Glucose-Capillary: 91 mg/dL (ref 70–99)
Glucose-Capillary: 127 mg/dL — ABNORMAL HIGH (ref 70–99)
Glucose-Capillary: 163 mg/dL — ABNORMAL HIGH (ref 70–99)
Glucose-Capillary: 145 mg/dL — ABNORMAL HIGH (ref 70–99)

## 2019-05-01 MED ORDER — SODIUM CHLORIDE 0.9 % IV SOLN: 250.0000 mL | INTRAVENOUS | Status: DC | PRN

## 2019-05-01 MED ORDER — MOVING RIGHT ALONG BOOK
Freq: Once | Status: AC
  Administered 2019-05-01: 08:00:00 1
  Filled 2019-05-01: qty 1

## 2019-05-01 MED ORDER — SODIUM CHLORIDE 0.9% FLUSH
3.0000 mL | Freq: Two times a day (BID) | INTRAVENOUS | Status: DC
  Administered 2019-05-01 – 2019-05-02 (×3): 3 mL via INTRAVENOUS

## 2019-05-01 MED ORDER — SODIUM CHLORIDE 0.9% FLUSH: 3.0000 mL | INTRAVENOUS | Status: DC | PRN

## 2019-05-01 NOTE — Progress Notes (Signed)
Received report on pt coming to [id number] from [facility]. Lajoyce Corners, RN

## 2019-05-01 NOTE — Progress Notes (Signed)
EVENING ROUNDS NOTE :     [address]       [place] [address]             [phone]                 5 Days Post-Op Procedure(s) (LRB): CORONARY ARTERY BYPASS GRAFTING (CABG) x Three, using left internal mammary artery and right leg greater saphenous vein harvested endoscopically (N/A) TRANSESOPHAGEAL ECHOCARDIOGRAM (TEE) (N/A)   Total Length of Stay:  LOS: 19 days  Events:  Doing well. Home in am    BP 119/86   Pulse 96   Temp 98.3 F (36.8 C) (Oral)   Resp 18   Ht 5\' 11"  (1.803 m)   Wt 109.7 kg   SpO2 99%   BMI 33.73 kg/m         . sodium chloride    . lactated ringers 10 mL/hr at 04/26/19 1626    I/O last 3 completed shifts: In: 1460 [P.O.:1460] Out: 1975 [Urine:1975]   CBC Latest Ref Rng & Units 05/01/2019 04/29/2019 04/28/2019  WBC 4.0 - 10.5 K/uL 6.4 9.9 9.4  Hemoglobin 13.0 - 17.0 g/dL 10.1(L) 9.3(L) 8.6(L)  Hematocrit 39.0 - 52.0 % 31.4(L) 29.5(L) 26.7(L)  Platelets 150 - 400 K/uL 254 242 196    BMP Latest Ref Rng & Units 05/01/2019 04/30/2019 04/29/2019  Glucose 70 - 99 mg/dL 135(H) 98 85  BUN 8 - 23 mg/dL 20 23 19   Creatinine 0.61 - 1.24 mg/dL 1.00 1.01 0.88  BUN/Creat Ratio 6 - 22 (calc) - - -  Sodium 135 - 145 mmol/L 132(L) 132(L) 130(L)  Potassium 3.5 - 5.1 mmol/L 4.2 4.4 3.9  Chloride 98 - 111 mmol/L 95(L) 94(L) 94(L)  CO2 22 - 32 mmol/L 27 29 28   Calcium 8.9 - 10.3 mg/dL 9.3 9.4 9.2    ABG    Component Value Date/Time   PHART 7.379 04/26/2019 2001   PCO2ART 43.7 04/26/2019 2001   PO2ART 92.0 04/26/2019 2001   HCO3 25.7 04/26/2019 2001   TCO2 25 04/27/2019 1655   ACIDBASEDEF 3.0 (H) 04/26/2019 1321   O2SAT 62.7 04/30/2019 0514       Melodie Bouillon, MD 05/01/2019 4:21 PM

## 2019-05-01 NOTE — TOC Initial Note (Signed)
Transition of Care [facility]) - Initial/Assessment Note    Patient Details  Name: Cameron Daniel MRN: [id number] Date of Birth: 06/26/1946  Transition [facility]) CM/SW Contact:    Bethena Roys, RN Phone Number: 05/01/2019, 4:11 PM  Clinical Narrative: Pt presented for Nstemi-sp CABG 8-28--20. PTA from home with spouse-plan to return home 05-02-19. Pt agreeable to [facility] PT services and DME Bedside commode. Patient will need HH PT orders and F2F and DME order for 3n1. Patient agreeable to have DME delivered from [employer]. CM trying to staff the patient for [facility] PT Services. Will continue to follow- several agencies called- awaiting call back.                  Expected Discharge Plan: [place] Barriers to Discharge: No Barriers Identified   Patient Goals and CMS Choice Patient states their goals for this hospitalization and ongoing recovery are:: "to return home" CMS Medicare.gov Compare Post Acute Care list provided to:: Patient Choice offered to / list presented to : Patient  Expected Discharge Plan and Services Expected Discharge Plan: [place] In-house Referral: NA Discharge Planning Services: CM Consult Post Acute Care Choice: Durable Medical Equipment, Home Health Living arrangements for the past 2 months: Single Family Home                 DME Arranged: 3-N-1 DME Agency: [facility] Date DME Agency Contacted: 05/01/19 Time DME Agency Contacted: [phone] Representative spoke with at DME Agency: [place] Arranged: PT     Prior Living Arrangements/Services Living arrangements for the past 2 months: [place] Lives with:: Spouse Patient language and need for interpreter reviewed:: Yes Do you feel safe going back to the place where you live?: Yes      Need for Family Participation in Patient Care: Yes (Comment) Care giver support system in place?: Yes (comment) Current home services: DME(Pt has RW in the home.) Criminal  Activity/Legal Involvement Pertinent to Current Situation/Hospitalization: No - Comment as needed  Activities of Daily Living Home Assistive Devices/Equipment: None ADL Screening (condition at time of admission) Patient's cognitive ability adequate to safely complete daily activities?: Yes Is the patient deaf or have difficulty hearing?: No Does the patient have difficulty seeing, even when wearing glasses/contacts?: No Does the patient have difficulty concentrating, remembering, or making decisions?: No Patient able to express need for assistance with ADLs?: Yes Does the patient have difficulty dressing or bathing?: No Independently performs ADLs?: Yes (appropriate for developmental age) Does the patient have difficulty walking or climbing stairs?: No Weakness of Legs: Both Weakness of Arms/Hands: Both  Permission Sought/Granted Permission sought to share information with : Family Supports Permission granted to share information with : Yes, Verbal Permission Granted     Permission granted to share info w AGENCY: [facility]        Emotional Assessment Appearance:: Appears stated age Attitude/Demeanor/Rapport: Engaged Affect (typically observed): Accepting Orientation: : Oriented to Self, Oriented to Place, Oriented to  Time, Oriented to Situation Alcohol / Substance Use: Not Applicable Psych Involvement: No (comment)  Admission diagnosis:  Acute respiratory failure (HCC) [J96.00] Elevated troponin [R79.89] Community acquired pneumonia of right lower lobe of lung ([place]) [J18.1] Patient Active Problem List   Diagnosis Date Noted  . 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 Hyponatremia 04/20/2019  . Retroperitoneal hematoma 04/18/2019  . Acute on chronic blood loss anemia 04/18/2019  . Acute systolic HF (heart failure) ([place]) 04/15/2019  .  Non-ST elevation (NSTEMI) myocardial infarction ([place]) 04/15/2019  . AKI (acute kidney injury) ([place])   . Community acquired pneumonia of right lower lobe  of lung ([place]) 04/12/2019  . Acute respiratory failure ([place]) 04/12/2019  . Allergic conjunctivitis 01/23/2018  . Chronic sinusitis 01/23/2018  . Gout 04/06/2012  . DIVERTICULITIS-COLON 06/18/2010  . FACIAL FLUSHING 12/11/2007  . NOCTURIA 12/11/2007  . Type 2 diabetes mellitus with hyperlipidemia ([place]) 09/07/2007  . OTHER MALAISE AND FATIGUE 09/07/2007  . Allergic rhinitis 08/16/2007  . HYPERLIPIDEMIA 01/24/2007  . OBESITY 01/24/2007  . ERECTILE DYSFUNCTION 01/24/2007  . HYPERTENSION 01/24/2007  . Coronary atherosclerosis 01/24/2007  . GERD 01/24/2007  . DIVERTICULOSIS, COLON 01/24/2007  . IRRITABLE BOWEL SYNDROME 01/24/2007  . COLONIC POLYPS, HX OF 01/24/2007  . CHOLECYSTECTOMY, HX OF 01/24/2007   PCP:  [name], MD Pharmacy:   [id number] - [place], [place] - [address] [place] [address] Phone: [phone] Fax: [phone]  [id number] - [place], [place] - [address] [place] [address] Phone: [phone] Fax: [phone]     Social Determinants of Health (SDOH) Interventions    Readmission Risk Interventions Readmission Risk Prevention Plan 05/01/2019  Transportation Screening Complete  Home Care Screening Complete  Medication Review (RN CM) Complete  Some recent data might be hidden

## 2019-05-01 NOTE — Progress Notes (Signed)
Progress Note  [name] Date of Encounter: 05/01/2019  Primary Cardiologist: [name], MD   Subjective   Underwent CABG x 3 today on 8/28   Left internal mammary graft to the LAD  SVG to OM  SVG to distal RCA  Feels good. Walking halls without difficulty. No CP or SOB SBP 120s. Labs stable   EF 45-50% on intra-op TEE  Inpatient Medications    Scheduled Meds: . acetaminophen  1,000 mg Oral Q6H   Or  . acetaminophen (TYLENOL) oral liquid 160 mg/5 mL  1,000 mg Per Tube Q6H  . aspirin EC  325 mg Oral Daily   Or  . aspirin  324 mg Per Tube Daily  . carvedilol  3.125 mg Oral BID WC  . Chlorhexidine Gluconate Cloth  6 each Topical Daily  . citalopram  10 mg Oral Daily  . docusate sodium  200 mg Oral Daily  . finasteride  5 mg Oral Daily  . insulin aspart  0-24 Units Subcutaneous TID WC  . insulin detemir  20 Units Subcutaneous Daily  . moving right along book   Does not apply Once  . pantoprazole  40 mg Oral Daily  . rosuvastatin  40 mg Oral q1800  . sodium chloride flush  3 mL Intravenous Q12H  . spironolactone  12.5 mg Oral Daily   Continuous Infusions: . sodium chloride    . lactated ringers 10 mL/hr at 04/26/19 1626   PRN Meds: sodium chloride, ondansetron (ZOFRAN) IV, oxyCODONE, sodium chloride flush, traMADol   Vital Signs    Vitals:   05/01/19 0600 05/01/19 0608 05/01/19 0700 05/01/19 0756  BP:  121/75 127/70   Pulse:  86 93   Resp: (!) 25 (!) 23 (!) 24   Temp:    97.9 F (36.6 C)  TempSrc:    Oral  SpO2:  99% 99%   Weight: 109.7 kg     Height:        Intake/Output Summary (Last 24 hours) at 05/01/2019 0811 Last data filed at 05/01/2019 0600 Gross per 24 hour  Intake 1260 ml  Output 1125 ml  Net 135 ml   Last 3 Weights 05/01/2019 04/30/2019 04/29/2019  Weight (lbs) 241 lb 13.5 oz 243 lb 9.7 oz 249 lb 12.5 oz  Weight (kg) 109.7 kg 110.5 kg 113.3 kg      Telemetry    NSR 80-90s.Personally reviewed   ECG    No  new EKG to review today- Personally Reviewed  Physical Exam   General:  Well appearing. No resp difficulty HEENT: normal Neck: supple. no JVD. Carotids 2+ bilat; no bruits. No lymphadenopathy or thryomegaly appreciated. Cor: sternum ok PMI nondisplaced. Regular rate & rhythm. No rubs, gallops or murmurs. Lungs: clear Abdomen: soft, nontender, nondistended. No hepatosplenomegaly. No bruits or masses. Good bowel sounds. Extremities: no cyanosis, clubbing, rash, edema Neuro: alert & orientedx3, cranial nerves grossly intact. moves all 4 extremities w/o difficulty. Affect pleasant  Labs    High Sensitivity Troponin:   Recent Labs  Lab 04/12/19 1647 04/12/19 1826 04/12/19 2352  TROPONINIHS 136* 150* 1,963*      Chemistry Recent Labs  Lab 04/28/19 0320 04/29/19 0500 04/30/19 1505  NA 127* 130* 132*  K 4.5 3.9 4.4  CL 93* 94* 94*  CO2 _0 GLUCOSE 137* 85 98  BUN _1 CREATININE 0.92 0.88 1.01  CALCIUM 9.1 9.2 9.4  GFRNONAA >60 >60 >60  GFRAA >60 >60 >  60  ANIONGAP _0 Hematology Recent Labs  Lab 04/27/19 1647 04/27/19 1655 04/28/19 0320 04/29/19 0500  WBC 10.5  --  9.4 9.9  RBC 2.90*  --  2.89* 3.16*  HGB 8.6* 9.2* 8.6* 9.3*  HCT 27.1* 27.0* 26.7* 29.5*  MCV 93.4  --  92.4 93.4  MCH 29.7  --  29.8 29.4  MCHC 31.7  --  32.2 31.5  RDW 17.0*  --  16.9* 16.9*  PLT 195  --  196 242    BNPNo results for input(s): BNP, PROBNP in the last 168 hours.   DDimer No results for input(s): DDIMER in the last 168 hours.   Radiology    No results found.  Cardiac Studies   LHC 04/16/19 Procedures  RIGHT/LEFT HEART CATH AND CORONARY ANGIOGRAPHY  Conclusion   Severe three-vessel coronary artery disease involving the ostial proximal and mid LAD, the mid RCA, and the mid and distal circumflex. There is moderate but not angiographically significant left main disease.  Global hypokinesis with reduced EF less than 30% has been noted by  echocardiography. Overall picture is consistent with ischemic cardiomyopathy, possibly worsened EF with stunned myocardium in the setting of severe three-vessel CAD and increased demand caused by pneumonia and intubation.  Hemodynamics suggest that volume status is stable. Cardiac output is low due to left ventricular hypocontractility.  RECOMMENDATIONS:   Strong consideration for surgical revascularization once medical status is improved.  Guideline directed therapy for left ventricular systolic dysfunction which should also include anti-ischemic therapy.  Preventive therapy with high intensity statin therapy.  Consider advanced heart failure team assessment to help optimize clinical condition.  Debate whether viability imaging is necessary.   Right Heart  Right Heart Pressures Hemodynamic findings consistent with mild pulmonary hypertension. LV EDP is normal.  Wall Motion  Resting    LVEF less than 30% by echo with global hypokinesis noted.        Coronary Diagrams  Diagnostic Dominance: Right  Intervention - awaiting CABG   2D Echo 04/13/19 IMPRESSIONS   1. The left ventricle has severely reduced systolic function, with an ejection fraction of 25-30%. The cavity size was normal. There is mildly increased left ventricular wall thickness. Left ventricular diastolic Doppler parameters are indeterminate. 2. Severe hypokinesis of the left ventricular, entire inferior wall and anteroseptal wall. 3. Left atrial size was mildly dilated. 4. Small pericardial effusion. 5. The pericardial effusion is posterior to the left ventricle. 6. The mitral valve is abnormal. Mild thickening of the mitral valve leaflet. There is mild to moderate mitral annular calcification present. 7. The aortic valve is tricuspid. Mild sclerosis of the aortic valve. No stenosis of the aortic valve. 8. The aorta is normal unless otherwise noted. 9. The inferior vena cava was  dilated in size with <50% respiratory variability. 10. The interatrial septum was not assessed.  SUMMARY  Very technically difficult study, despite definity contrast. LVEF 25-30%, global hypokinesis with severe inferior and anteroseptal hypokinesis, indeterminate diastolic function, elevated LV filling pressure, mild LAE, MAC with mild MR, aortic valve sclerosis, dilated IVC, small posterior pericardial effusion.   Patient Profile     73 y.o. male w/ known CAD s/p MI 30 years ago w/ RCA PCI, who [facility] w/ acute hypoxic respiratory failure requiring intubation. CTA c/w RLL PNA. COVID negative. Hs Troponin c/w NSTEMI. 2D echo showed reduced LVEF at 25-30% w/ WMA. Subsequent LHC showed severe 3V CAD. Post cath recovery complicated by  retroperitoneal bleed. IV heparin discontinued. S/p transfusion. Now awaiting CABG  Assessment & Plan    1. CAD/ NSTEMI:  - hs Troponin peaked at 1,963. Cardiac cath 04/16/19 showed severe three-vessel coronary artery disease involving the ostial proximal and mid LAD, the mid RCA, and the mid and distal circumflex.  - initial EF 25-30% - s/p CABG x 3 on  8/28 (LIMA to LAD, SVG to OM, SVG to distal RCA) - Doing well post-op. Continue to ambulate   2. Acute Systolic CHF/ Ischemic Cardiomyopathy:  - initial EF 25-30%. Treated with milrinone - intra-op TEE 8/28 EF 45-50% - Weight well below pre-op. - on low-dose carvedilol  - continue spiro 12.5 - Renal function stable  3. Retroperitoneal Bleed/ Acute Blood Loss Anemia:  - Hgb stable at 10.8  4. Hyponatremia: -Na has improved 132 today -Continue to follow  -Restrict free water.    5. DM:  - on insulin and SSI  - will need SGLT2i on d/c  6. Lipids: on statin, Crestor 40 mg nightly.  Last lipid panel in June showed LDL at 59 mg/dL. Despite good lipid control w/ LDL < 70, he still has severe multivessel CAD. Consider more aggressive lipid lowering therapy to drive LDL down to <  50 mg/dL. Can adjust regimen post discharge. Consider later addition of Zetia vs PCSK9 inhibitor.   7. AKI: - improved.  Looks great. Stable for d/c from our standpoint. We will sign off.   Home meds  ECASA 81 Crestor 40 (increase from 10) Benicar 10 (was on 20) Carvedilol 3.125 bid (new) Spiro 12.5 daily (new) Jardiance 10 (new. Stop Actos)  F/u HF Clinic 1 week    For questions or updates, please contact [place] Please consult [email or web address] for contact info under     Glori Bickers, MD  05/01/2019, 8:11 AM

## 2019-05-01 NOTE — Progress Notes (Signed)
Report given to Emerson Monte, RN. Patient transported to 4E by RN. Belongings at bedside with patient. Patient remained stable throughout transport.

## 2019-05-01 NOTE — Plan of Care (Signed)
  Problem: Education: Goal: Knowledge of General Education information will improve Description: Including pain rating scale, medication(s)/side effects and non-pharmacologic comfort measures Outcome: Progressing   Problem: Clinical Measurements: Goal: Ability to maintain clinical measurements within normal limits will improve Outcome: Progressing Goal: Will remain free from infection Outcome: Progressing Goal: Respiratory complications will improve Outcome: Progressing Goal: Cardiovascular complication will be avoided Outcome: Progressing   Problem: Activity: Goal: Risk for activity intolerance will decrease Outcome: Progressing   Problem: Nutrition: Goal: Adequate nutrition will be maintained Outcome: Progressing   Problem: Coping: Goal: Level of anxiety will decrease Outcome: Progressing   Problem: Elimination: Goal: Will not experience complications related to urinary retention Outcome: Progressing   Problem: Pain Managment: Goal: General experience of comfort will improve Outcome: Progressing   Problem: Safety: Goal: Ability to remain free from injury will improve Outcome: Progressing   Problem: Skin Integrity: Goal: Risk for impaired skin integrity will decrease Outcome: Progressing   

## 2019-05-01 NOTE — Progress Notes (Addendum)
TCTS DAILY ICU PROGRESS NOTE                   [address]            [place] [address]          [phone]   5 Days Post-Op Procedure(s) (LRB): CORONARY ARTERY BYPASS GRAFTING (CABG) x Three, using left internal mammary artery and right leg greater saphenous vein harvested endoscopically (N/A) TRANSESOPHAGEAL ECHOCARDIOGRAM (TEE) (N/A)  Total Length of Stay:  LOS: 19 days   Subjective: Feels good this morning. Walking around the unit with ease.   Objective: Vital signs in last 24 hours: Temp:  [97.5 F (36.4 C)-97.8 F (36.6 C)] 97.5 F (36.4 C) (09/02 0413) Pulse Rate:  [80-93] 93 (09/02 0700) Cardiac Rhythm: Normal sinus rhythm (09/02 0400) Resp:  [18-26] 24 (09/02 0700) BP: (94-131)/(61-75) 127/70 (09/02 0700) SpO2:  [96 %-100 %] 99 % (09/02 0700) Weight:  [109.7 kg] 109.7 kg (09/02 0600)  Filed Weights   04/29/19 0600 04/30/19 0500 05/01/19 0600  Weight: 113.3 kg 110.5 kg 109.7 kg    Weight change: -0.8 kg      Intake/Output from previous day: 09/01 0701 - 09/02 0700 In: 1460 [P.O.:1460] Out: 1225 [Urine:1225]  Intake/Output this shift: No intake/output data recorded.  Current Meds: Scheduled Meds: . acetaminophen  1,000 mg Oral Q6H   Or  . acetaminophen (TYLENOL) oral liquid 160 mg/5 mL  1,000 mg Per Tube Q6H  . aspirin EC  325 mg Oral Daily   Or  . aspirin  324 mg Per Tube Daily  . carvedilol  3.125 mg Oral BID WC  . Chlorhexidine Gluconate Cloth  6 each Topical Daily  . citalopram  10 mg Oral Daily  . docusate sodium  200 mg Oral Daily  . finasteride  5 mg Oral Daily  . glipiZIDE  5 mg Oral QAC breakfast  . insulin aspart  0-24 Units Subcutaneous TID WC  . insulin detemir  20 Units Subcutaneous Daily  . pantoprazole  40 mg Oral Daily  . rosuvastatin  40 mg Oral q1800  . sodium chloride flush  10 mL Intravenous Q12H  . spironolactone  12.5 mg Oral Daily   Continuous Infusions: . sodium chloride    . lactated ringers 10  mL/hr at 04/26/19 1626   PRN Meds:.morphine injection, ondansetron (ZOFRAN) IV, oxyCODONE, sodium chloride flush, traMADol  General appearance: alert, cooperative and no distress Heart: regular rate and rhythm, S1, S2 normal, no murmur, click, rub or gallop Lungs: clear to auscultation bilaterally Abdomen: soft, non-tender; bowel sounds normal; no masses,  no organomegaly Extremities: extremities normal, atraumatic, no cyanosis or edema Wound: clean and dry  Lab Results: CBC: Recent Labs    04/29/19 0500  WBC 9.9  HGB 9.3*  HCT 29.5*  PLT 242   BMET:  Recent Labs    04/29/19 0500 04/30/19 1505  NA 130* 132*  K 3.9 4.4  CL 94* 94*  CO2 28 29  GLUCOSE 85 98  BUN 19 23  CREATININE 0.88 1.01  CALCIUM 9.2 9.4    CMET: Lab Results  Component Value Date   WBC 9.9 04/29/2019   HGB 9.3 (L) 04/29/2019   HCT 29.5 (L) 04/29/2019   PLT 242 04/29/2019   GLUCOSE 98 04/30/2019   CHOL 145 02/15/2019   TRIG 260 (H) 02/15/2019   HDL 55 02/15/2019   LDLDIRECT 49.9 08/26/2013   LDLCALC 59 02/15/2019   ALT 64 (H)  04/24/2019   AST 72 (H) 04/24/2019   NA 132 (L) 04/30/2019   K 4.4 04/30/2019   CL 94 (L) 04/30/2019   CREATININE 1.01 04/30/2019   BUN 23 04/30/2019   CO2 29 04/30/2019   TSH 2.142 04/21/2019   PSA 2.0 02/15/2019   INR 1.4 (H) 04/26/2019   HGBA1C 6.7 (H) 04/26/2019   MICROALBUR 1.5 02/15/2019      PT/INR: No results for input(s): LABPROT, INR in the last 72 hours. Radiology: No results found.   Assessment/Plan: S/P Procedure(s) (LRB): CORONARY ARTERY BYPASS GRAFTING (CABG) x Three, using left internal mammary artery and right leg greater saphenous vein harvested endoscopically (N/A) TRANSESOPHAGEAL ECHOCARDIOGRAM (TEE) (N/A)   1. CV-  Off milrinone. HR and BP well controlled.  Continue ASA and statin.  2. Pulm-tolerating room air with good oxygen saturation.  3. Renal-Creatinine 1.01 and electrolytes okay from yesterday. Continue Spironolactone. Lasix  was discontinued. Weight continues to trend down. 4. H and H 9.3/29.5, expected acute blood loss anemia.  5. Endo-blood glucose remains low. Continue Levemir at 20 units daily. Will discontinue Glipizide.  6. + flatus but no bowel movement yet. Will continue stool softeners.   Plan: Remove EPW today. Transfer orders placed yesterday. Probably home tomorrow.     Elgie Collard 05/01/2019 7:40 AM    Chart reviewed, patient examined, agree with above. He is doing well overall, ambulating well without chest pain or shortness of breath. Wt is at preop. Plan home tomorrow.

## 2019-05-01 NOTE — Progress Notes (Signed)
CARDIAC REHAB PHASE I   PRE:  Rate/Rhythm: 90 SR  BP:  Sitting: 117/65      SaO2: 95 RA  MODE:  Ambulation: 295 ft   POST:  Rate/Rhythm: 106 ST  BP:  Sitting: 119/86    SaO2: 97 RA  Pt ambulated 231ft in hallway standby assist with front wheel walker. Pt denies SOB, c/o some incisional pain. Encouraged pt to sit in chair for meals and continue IS use. Pt looks much better than prior to surgery and already to increase ambulation distances. Will continue to follow. Plans for d/c education with wife if pt able to go home tomorrow.  [place], RN BSN 05/01/2019 2:11 PM

## 2019-05-02 ENCOUNTER — Other Ambulatory Visit: Payer: Self-pay | Admitting: Family Medicine

## 2019-05-02 LAB — GLUCOSE, CAPILLARY
Glucose-Capillary: 99 mg/dL (ref 70–99)
Glucose-Capillary: 143 mg/dL — ABNORMAL HIGH (ref 70–99)

## 2019-05-02 MED ORDER — EMPAGLIFLOZIN 25 MG PO TABS: 25.0000 mg | ORAL_TABLET | Freq: Every day | ORAL | 1 refills | Status: DC

## 2019-05-02 MED ORDER — ASPIRIN 325 MG PO TBEC: 325.0000 mg | DELAYED_RELEASE_TABLET | Freq: Every day | ORAL | 0 refills | Status: DC

## 2019-05-02 MED ORDER — SPIRONOLACTONE 25 MG PO TABS: 12.5000 mg | ORAL_TABLET | Freq: Every day | ORAL | 1 refills | Status: DC

## 2019-05-02 MED ORDER — ROSUVASTATIN CALCIUM 40 MG PO TABS: 40.0000 mg | ORAL_TABLET | Freq: Every day | ORAL | 1 refills | Status: DC

## 2019-05-02 MED ORDER — OXYCODONE HCL 5 MG PO TABS: 5.0000 mg | ORAL_TABLET | Freq: Four times a day (QID) | ORAL | 0 refills | Status: DC | PRN

## 2019-05-02 MED ORDER — JARDIANCE 25 MG PO TABS: 25.0000 mg | ORAL_TABLET | Freq: Every day | ORAL | 5 refills | Status: DC

## 2019-05-02 MED ORDER — CARVEDILOL 3.125 MG PO TABS: 3.1250 mg | ORAL_TABLET | Freq: Two times a day (BID) | ORAL | 1 refills | Status: DC

## 2019-05-02 MED FILL — ASPIRIN EC 325 MG TABLET: 325 | 30 days supply | Qty: 30 | Fill #0

## 2019-05-02 MED FILL — ROSUVASTATIN CALCIUM 40 MG: 40 | 30 days supply | Qty: 30 | Fill #0

## 2019-05-02 MED FILL — JARDIANCE 25 MG TABLET: 25 | 30 days supply | Qty: 30 | Fill #0

## 2019-05-02 MED FILL — oxyCODONE HCL 5 MG TABS: 5 | 7 days supply | Qty: 30 | Fill #0

## 2019-05-02 MED FILL — SPIRONOLACTONE 25 MG TABLET: 25 | 30 days supply | Qty: 30 | Fill #0

## 2019-05-02 MED FILL — CARVEDILOL 3.125 MG TABLET: 3.125 | 30 days supply | Qty: 60 | Fill #0

## 2019-05-02 NOTE — Progress Notes (Signed)
Pt arrived from [facility] to [facility]. Pt given CHG bath and assessments done. CCMD called and tele applied. Pt denies pain. Given call bell and bed in lowest position. Will continue to monitor. Lajoyce Corners, RN

## 2019-05-02 NOTE — Care Management Important Message (Signed)
Important Message  Patient Details  Name: Cameron Daniel MRN: [id number] Date of Birth: Nov 10, 1945   Medicare Important Message Given:  Yes     Shelda Altes 05/02/2019, 11:40 AM

## 2019-05-02 NOTE — Progress Notes (Signed)
Patient provided discharge and education instructions. Tele box removed. CCMD notified. IV's removed and intact. VSS. Patient denies complaints. Patient has all belongings. Volunteers called to transport patient via wheelchair to [place] parking. Arletta Bale, RN

## 2019-05-02 NOTE — Discharge Instructions (Signed)
Discharge Instructions:  1. You may shower, please wash incisions daily with soap and water and keep dry.  If you wish to cover wounds with dressing you may do so but please keep clean and change daily.  No tub baths or swimming until incisions have completely healed.  If your incisions become red or develop any drainage please call our office at [phone]  2. No Driving until cleared by our office and you are no longer using narcotic pain medications  3. Monitor your weight daily.. Please use the same scale and weigh at same time... If you gain 3-5 lbs in 48 hours with associated lower extremity swelling, please contact our office at [phone]  4. Fever of 101.5 for at least 24 hours with no source, please contact our office at [phone]  5. Activity- up as tolerated, please walk at least 3 times per day.  Avoid strenuous activity, no lifting, pushing, or pulling with your arms over 8-10 lbs for a minimum of 6 weeks  6. If any questions or concerns arise, please do not hesitate to contact our office at [phone]  7. Monitor blood glucose level closely. If <100 or >200 please contact your PCP for a medication adjustment.

## 2019-05-02 NOTE — Progress Notes (Signed)
      [address]       [place] [address]             [phone]      6 Days Post-Op Procedure(s) (LRB): CORONARY ARTERY BYPASS GRAFTING (CABG) x Three, using left internal mammary artery and right leg greater saphenous vein harvested endoscopically (N/A) TRANSESOPHAGEAL ECHOCARDIOGRAM (TEE) (N/A) Subjective: Feels good this morning  Objective: Vital signs in last 24 hours: Temp:  [97.7 F (36.5 C)-98.3 F (36.8 C)] 97.8 F (36.6 C) (09/03 0736) Pulse Rate:  [88-96] 92 (09/03 0736) Cardiac Rhythm: Normal sinus rhythm (09/02 2159) Resp:  [16-23] 16 (09/03 0326) BP: (101-123)/(61-86) 110/76 (09/03 0736) SpO2:  [97 %-100 %] 97 % (09/03 0326) Weight:  [110.3 kg] 110.3 kg (09/03 0326)     Intake/Output from previous day: 09/02 0701 - 09/03 0700 In: 228 [P.O.:225; I.V.:3] Out: 850 [Urine:850] Intake/Output this shift: No intake/output data recorded.  General appearance: alert, cooperative and no distress Heart: regular rate and rhythm, S1, S2 normal, no murmur, click, rub or gallop Lungs: clear to auscultation bilaterally Abdomen: soft, non-tender; bowel sounds normal; no masses,  no organomegaly Extremities: extremities normal, atraumatic, no cyanosis or edema Wound: clean and dry  Lab Results: Recent Labs    05/01/19 1017  WBC 6.4  HGB 10.1*  HCT 31.4*  PLT 254   BMET:  Recent Labs    04/30/19 1505 05/01/19 1017  NA 132* 132*  K 4.4 4.2  CL 94* 95*  CO2 29 27  GLUCOSE 98 135*  BUN 23 20  CREATININE 1.01 1.00  CALCIUM 9.4 9.3    PT/INR: No results for input(s): LABPROT, INR in the last 72 hours. ABG    Component Value Date/Time   PHART 7.379 04/26/2019 2001   HCO3 25.7 04/26/2019 2001   TCO2 25 04/27/2019 1655   ACIDBASEDEF 3.0 (H) 04/26/2019 1321   O2SAT 62.7 04/30/2019 0514   CBG (last 3)  Recent Labs    05/01/19 1604 05/01/19 2203 05/02/19 0602  GLUCAP 163* 145* 99    Assessment/Plan: S/P Procedure(s) (LRB):  CORONARY ARTERY BYPASS GRAFTING (CABG) x Three, using left internal mammary artery and right leg greater saphenous vein harvested endoscopically (N/A) TRANSESOPHAGEAL ECHOCARDIOGRAM (TEE) (N/A)  1. CV- HR and BP well controlled. Continue ASA and statin.  2. Pulm-tolerating room air with good oxygen saturation.  3. Renal-Creatinine 1.00 and electrolytes okay from yesterday. Continue Spironolactone. Lasix was discontinued. Weight continues to trend down. 4. H and H 10.1/31.4, expected acute bloodloss anemia.  5. Endo-Improved. None lower than 91.  Continue Levemir at 20 units daily.  6. + flatus but no bowel movement yet. Will continue stool softeners.   Plan: Will discontinue Levemir on discharge and add back Glipizide and Actos. Discussed taking blood sugar readings 3 times a day initially and getting in touch with PCP.      LOS: 20 days    Cameron Daniel 05/02/2019

## 2019-05-02 NOTE — Progress Notes (Addendum)
Spoke to patient on the phone about his diabetes. Patient states that he has a home blood glucose meter, strips, and lancets. Instructed him to check blood sugars X3 per day and keep a record of the blood sugars to take with him to PCP. He knows how to treat hypoglycemia (CBGs less than 70 mg/dl) and recheck blood sugar 15 minutes later. States that he already has appointment with PCP.   Patient seemed confident in home regimen. Attached diabetes exit notes on hypoglycemia treatment, blood glucose monitoring, general diabetes care to chart.  Harvel Ricks RN BSN CDE Diabetes Coordinator Pager: [phone]  8am-5pm

## 2019-05-02 NOTE — Progress Notes (Signed)
CARDIAC REHAB PHASE I   D/c education completed with pt and wife. Pt educated on importance of showers and monitoring incisions daily. Encouraged continued IS use, walks, and sternal precautions. Pt given in-the-tube sheet along with heart healthy, diabetic, and low sodium diets. Reviewed importance of daily weights, restrictions, and exercise guidelines. Will refer to CRP II .   [id number] [name], RN BSN 05/02/2019 11:21 AM

## 2019-05-02 NOTE — TOC Transition Note (Signed)
Transition of Care [facility]) - CM/SW Discharge Note Marvetta Gibbons RN, BSN Transitions of Care Unit 4E- RN Case Manager [phone]   Patient Details  Name: Cameron Daniel MRN: [id number] Date of Birth: October 21, 1945  [facility]) CM/SW Contact:  Dawayne Patricia, RN Phone Number: 05/02/2019, 3:38 PM   Clinical Narrative:    Pt stable for transition home today, 3n1 has been delivered to room for discharge. Per previous CM, [facility]vis [facility] has accepted referral- spoke with [place] at [place] and confirmed services for HHPT. [place] pharmacy to deliver meds to bedside prior to transition home.    Final next level of care: [place] Barriers to Discharge: No Barriers Identified   Patient Goals and CMS Choice Patient states their goals for this hospitalization and ongoing recovery are:: "to return home" CMS Medicare.gov Compare Post Acute Care list provided to:: Patient Choice offered to / list presented to : Patient  Discharge Placement          Home with [facility]             Discharge Plan and Services In-house Referral: NA Discharge Planning Services: CM Consult Post Acute Care Choice: Durable Medical Equipment, Home Health          DME Arranged: 3-N-1 DME Agency: [employer] Date DME Agency Contacted: 05/01/19 Time DME Agency Contacted: [phone] Representative spoke with at DME Agency: [place]: PT [place]: [place] Date [place]: 05/02/19 Time [place]: 1044 Representative spoke with at [place]: [place] ([place]) Interventions     Readmission Risk Interventions Readmission Risk Prevention Plan 05/02/2019 05/01/2019  Transportation Screening - Complete  PCP or Specialist Appt within 5-7 Days Complete -  Home Care Screening - Complete  Medication Review (RN CM) - Complete  Some recent data might be hidden

## 2019-05-03 DIAGNOSIS — I081 Rheumatic disorders of both mitral and tricuspid valves: Secondary | ICD-10-CM | POA: Diagnosis not present

## 2019-05-03 DIAGNOSIS — I110 Hypertensive heart disease with heart failure: Secondary | ICD-10-CM | POA: Diagnosis not present

## 2019-05-03 DIAGNOSIS — J9601 Acute respiratory failure with hypoxia: Secondary | ICD-10-CM | POA: Diagnosis not present

## 2019-05-03 DIAGNOSIS — I272 Pulmonary hypertension, unspecified: Secondary | ICD-10-CM | POA: Diagnosis not present

## 2019-05-03 DIAGNOSIS — Z48812 Encounter for surgical aftercare following surgery on the circulatory system: Secondary | ICD-10-CM | POA: Diagnosis not present

## 2019-05-03 DIAGNOSIS — E11649 Type 2 diabetes mellitus with hypoglycemia without coma: Secondary | ICD-10-CM | POA: Diagnosis not present

## 2019-05-03 DIAGNOSIS — I0981 Rheumatic heart failure: Secondary | ICD-10-CM | POA: Diagnosis not present

## 2019-05-03 DIAGNOSIS — I5021 Acute systolic (congestive) heart failure: Secondary | ICD-10-CM | POA: Diagnosis not present

## 2019-05-03 DIAGNOSIS — I214 Non-ST elevation (NSTEMI) myocardial infarction: Secondary | ICD-10-CM | POA: Diagnosis not present

## 2019-05-03 DIAGNOSIS — I2510 Atherosclerotic heart disease of native coronary artery without angina pectoris: Secondary | ICD-10-CM | POA: Diagnosis not present

## 2019-05-07 ENCOUNTER — Encounter: Payer: Self-pay | Admitting: Family Medicine

## 2019-05-07 ENCOUNTER — Ambulatory Visit (INDEPENDENT_AMBULATORY_CARE_PROVIDER_SITE_OTHER): Payer: Medicare HMO | Admitting: Family Medicine

## 2019-05-07 VITALS — BP 120/80 | HR 67 | Temp 97.6°F | Ht 71.0 in | Wt 230.0 lb

## 2019-05-07 DIAGNOSIS — I2510 Atherosclerotic heart disease of native coronary artery without angina pectoris: Secondary | ICD-10-CM | POA: Diagnosis not present

## 2019-05-07 DIAGNOSIS — Z09 Encounter for follow-up examination after completed treatment for conditions other than malignant neoplasm: Secondary | ICD-10-CM | POA: Diagnosis not present

## 2019-05-07 DIAGNOSIS — I214 Non-ST elevation (NSTEMI) myocardial infarction: Secondary | ICD-10-CM

## 2019-05-07 DIAGNOSIS — E118 Type 2 diabetes mellitus with unspecified complications: Secondary | ICD-10-CM | POA: Diagnosis not present

## 2019-05-07 DIAGNOSIS — I5021 Acute systolic (congestive) heart failure: Secondary | ICD-10-CM

## 2019-05-07 MED ORDER — LIDOCAINE-HYDROCORTISONE ACE 3-2.5 % RE KIT: 1.0000 "application " | PACK | Freq: Two times a day (BID) | RECTAL | 1 refills | Status: DC

## 2019-05-07 MED ORDER — CLOTRIMAZOLE 1 % EX OINT: 1.0000 "application " | TOPICAL_OINTMENT | Freq: Two times a day (BID) | CUTANEOUS | 1 refills | Status: DC | PRN

## 2019-05-07 NOTE — Progress Notes (Signed)
ADVANCED HF CLINIC NOTE  Primary Care: Margaretmary Eddy Primary Cardiologist: Catrice Zuleta  HPI:  Cameron Daniel is a 73 year old gentleman with history of HTN, DM2, HL, CAD s/p CABG 05/27/19 (LIMA -> LAD, SVG -> OM, SVG to dRCA).  He returns today for post-hospital f/u. Admitted in 8/20 with respiratory failure. He developed progressive respiratory failure with hypoxia and tachypnea and required intubation in the emergency room. He was seen by PCCM.He was started on antibiotics. A post intubation chest x-ray showed central pulmonary edema and PNA. He had a stat echocardiogram which showed ejection fraction of 25 to 30%  He underwent cardiac catheterization today showing severe three-vessel coronary disease with low output.   He was started on milrinone post-cath. He developed a large spontaneous RP bleed post cath (cath all done from arm). He was kept on milrinone and abx. He improved steadily throughout the week. Underwent CABG on 9/28. Pre-op TEE EF 45-50%. Post CABG course uneventful.   He is here for post d/c f/u visit. Feels like he is getting stronger every day. No CP or SOB. Uses a walker to get around. No fever or chills. No dizziness. Has not restarted Benicar. Arlyce Harman started in hospital. Feels constipated. Taking colace and Miralax  Potassium on labs yesterday 5.1     Past Medical History:  Diagnosis Date  . Allergic rhinitis, cause unspecified   . Allergy   . Anxiety   . Arthritis   . Cancer ([place])   . Cataract   . Coronary atherosclerosis of unspecified type of vessel, native or graft    a. Details unclear, someone previously wrote "1985 , arthroplasty x 1."   . Depression    PTSD   . Diverticulosis of colon (without mention of hemorrhage)   . Esophageal reflux   . Esophageal stricture   . Essential hypertension   . Flushing   . Hemorrhoids   . Hyperlipidemia   . Irritable bowel syndrome   . Myocardial infarction ([place])   . Nocturia   . Obesity, unspecified   . Other  acquired absence of organ   . Other malaise and fatigue   . Personal history of colonic polyps   . Psychosexual dysfunction with inhibited sexual excitement   . Type II or unspecified type diabetes mellitus without mention of complication, not stated as uncontrolled     Current Outpatient Medications  Medication Sig Dispense Refill  . aspirin EC 325 MG EC tablet Take 1 tablet (325 mg total) by mouth daily. 30 tablet 0  . carvedilol (COREG) 3.125 MG tablet Take 1 tablet (3.125 mg total) by mouth 2 (two) times daily with a meal. 60 tablet 1  . Cholecalciferol (VITAMIN D3) 1000 units CAPS Take 1,000 Units by mouth daily.    . citalopram (CELEXA) 10 MG tablet Take 10 mg by mouth daily.    . Clotrimazole 1 % OINT Apply 1 application topically 2 (two) times daily as needed. 60 g 1  . colestipol (COLESTID) 5 g packet Take 5 g by mouth 2 (two) times daily.    . empagliflozin (JARDIANCE) 25 MG TABS tablet Take 25 mg by mouth daily before breakfast. 90 tablet 1  . fexofenadine (ALLEGRA) 180 MG tablet Take 1 tablet (180 mg total) by mouth daily as needed. For allergies 90 tablet 0  . finasteride (PROSCAR) 5 MG tablet Take 1 tablet (5 mg total) by mouth daily. 90 tablet 3  . fluticasone (FLONASE) 50 MCG/ACT nasal spray SPRAY 2 SPRAYS INTO EACH NOSTRIL EVERY  DAY 48 mL 2  . glipiZIDE (GLUCOTROL) 5 MG tablet TAKE 1 TABLET (5 MG TOTAL) BY MOUTH DAILY BEFORE BREAKFAST. 90 tablet 1  . Lidocaine-Hydrocortisone Ace 3-2.5 % KIT Place 1 application rectally 2 (two) times daily. 1 kit 1  . Multiple Vitamin (MULTI-VITAMINS) TABS Take 1 capsule by mouth daily.    . pantoprazole (PROTONIX) 40 MG tablet TAKE 1 TABLET (40 MG TOTAL) BY MOUTH EVERY MORNING. 90 tablet 3  . rosuvastatin (CRESTOR) 40 MG tablet Take 1 tablet (40 mg total) by mouth daily at 6 PM. 30 tablet 1  . spironolactone (ALDACTONE) 25 MG tablet Take 0.5 tablets (12.5 mg total) by mouth daily. 30 tablet 1  . testosterone cypionate (DEPOTESTOSTERONE  CYPIONATE) 200 MG/ML injection INJECT 1 ML EVERY 14 DAYS 6 mL 3   No current facility-administered medications for this encounter.     Allergies  Allergen Reactions  . Doxazosin Mesylate     UNKNOWN REACTION  . Flomax [Tamsulosin Hcl] Itching       . Jardiance [Empagliflozin] Nausea Only and Other (See Comments)    dizziness - Severe (per pt)  . Penicillins Rash and Other (See Comments)    Has patient had a PCN reaction causing immediate rash, facial/tongue/throat swelling, SOB or lightheadedness with hypotension: No Has patient had a PCN reaction causing severe rash involving mucus membranes or skin necrosis: No Has patient had a PCN reaction that required hospitalization No Has patient had a PCN reaction occurring within the last 10 years: No If all of the above answers are "NO", then may proceed with Cephalosporin use.   . Sulfonamide Derivatives Rash  . Trulicity [Dulaglutide] Nausea And Vomiting    N&V, Dizziness      Social History   Socioeconomic History  . Marital status: Married    Spouse name: Not on file  . Number of children: 1  . Years of education: Not on file  . Highest education level: Not on file  Occupational History  . 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 Financial resource strain: Not on file  . Food insecurity    Worry: Not on file    Inability: Not on file  . Transportation needs    Medical: Not on file    Non-medical: Not on file  Tobacco Use  . Smoking status: Former Smoker    Quit date: 08/30/1983    Years since quitting: 35.7  . Smokeless tobacco: Former Systems developer  . Tobacco comment: Quit 30 years ago.  Substance and Sexual Activity  . Alcohol use: Yes    Comment: rare  . Drug use: No  . Sexual activity: Yes  Lifestyle  . Physical activity    Days per week: Not on file    Minutes per session: Not on file  . Stress: Not on file  Relationships  . Social Herbalist on phone: Not on file    Gets together: Not on file    Attends  religious service: Not on file    Active member of club or organization: Not on file    Attends meetings of clubs or organizations: Not on file    Relationship status: Not on file  . Intimate partner violence    Fear of current or ex partner: Not on file    Emotionally abused: Not on file    Physically abused: Not on file    Forced sexual activity: Not on file  Other Topics Concern  . Not on file  Social History Narrative  . Not on file      Family History  Problem Relation Age of Onset  . Lung cancer Father   . Allergic rhinitis Father   . Coronary artery disease Mother   . Colon cancer Neg Hx   . Heart attack Neg Hx   . Hypertension Neg Hx   . Stroke Neg Hx   . Esophageal cancer Neg Hx   . Rectal cancer Neg Hx   . Stomach cancer Neg Hx   . Pancreatic cancer Neg Hx     Vitals:   05/08/19 1031  BP: 122/76  Pulse: 77  SpO2: 98%  Weight: 103.2 kg (227 lb 8 oz)    PHYSICAL EXAM: General:  Well appearing. No respiratory difficulty HEENT: normal Neck: supple. no JVD. Carotids 2+ bilat; no bruits. No lymphadenopathy or thryomegaly appreciated. Cor: Sternum looks good PMI nondisplaced. Regular rate & rhythm. No rubs, gallops or murmurs. Lungs: clear Abdomen: soft, nontender, nondistended. No hepatosplenomegaly. No bruits or masses. Good bowel sounds. Extremities: no cyanosis, clubbing, rash, edema small seroma on RLE which popped with me pressing on it Neuro: alert & oriented x 3, cranial nerves grossly intact. moves all 4 extremities w/o difficulty. Affect pleasant.  ECG: NSR 75 LAFB Personally reviewed   ASSESSMENT & PLAN:  1. CAD s/p CABG 05/27/19 - CABG 05/27/19 (LIMA -> LAD, SVG -> OM, SVG to dRCA) - Continue ASA, statin and b-blocker - f/u with Dr. Cyndia Bent tomorrow for suture removal - refer to CR  2. Chronic Systolic HF - pre-CABG echo EF 25-30% - improved to 45-50% on intra-op TEE - K up. Will stop spiro - restart Benicar 20 on Friday. BMET 1 week  -  Continue carvedilol 3.125 bid  3. DM2 - on Jardiance. Continue  4. HL - continue statin. Goal LDL < 70  Glori Bickers, MD  1:34 PM

## 2019-05-07 NOTE — Progress Notes (Signed)
Subjective:    Patient ID: Cameron Daniel, male    DOB: 12/20/1945, 73 y.o.   MRN: [id number]  HPI  Unfortunately, patient was recently admitted to the hospital and was found to have severe three-vessel coronary artery disease as well as congestive heart failure.  I have copied relevant portions of the discharge summary below for my reference:  Admit date: 04/12/2019 Discharge date: 05/02/2019  Admission Diagnoses:      Patient Active Problem List   Diagnosis Date Noted  . Hyponatremia 04/20/2019  . Retroperitoneal hematoma 04/18/2019  . Acute on chronic blood loss anemia 04/18/2019  . Acute systolic HF (heart failure) ([place]) 04/15/2019  . Non-ST elevation (NSTEMI) myocardial infarction ([place]) 04/15/2019  . AKI (acute kidney injury) ([place])   . Community acquired pneumonia of right lower lobe of lung ([place]) 04/12/2019  . Acute respiratory failure ([place]) 04/12/2019  . Allergic conjunctivitis 01/23/2018  . Chronic sinusitis 01/23/2018  . Gout 04/06/2012  . DIVERTICULITIS-COLON 06/18/2010  . FACIAL FLUSHING 12/11/2007  . NOCTURIA 12/11/2007  . Type 2 diabetes mellitus with hyperlipidemia ([place]) 09/07/2007  . OTHER MALAISE AND FATIGUE 09/07/2007  . Allergic rhinitis 08/16/2007  . HYPERLIPIDEMIA 01/24/2007  . OBESITY 01/24/2007  . ERECTILE DYSFUNCTION 01/24/2007  . HYPERTENSION 01/24/2007  . Coronary atherosclerosis 01/24/2007  . GERD 01/24/2007  . DIVERTICULOSIS, COLON 01/24/2007  . IRRITABLE BOWEL SYNDROME 01/24/2007  . COLONIC POLYPS, HX OF 01/24/2007  . CHOLECYSTECTOMY, HX OF 01/24/2007    Discharge Diagnoses:  Principal Problem:   Non-ST elevation (NSTEMI) myocardial infarction [facility]) Active Problems:   Type 2 diabetes mellitus with hyperlipidemia (HCC)   Coronary atherosclerosis   GERD   Community acquired pneumonia of right lower lobe of lung ([place])   Acute respiratory failure (HCC)   AKI (acute kidney injury) ([place])   Acute systolic HF (heart failure) (HCC)  Retroperitoneal hematoma   Acute on chronic blood loss anemia   Hyponatremia   S/P CABG x 3   Discharged Condition: good  HPI:  The patient is a 73 year old gentleman with history of hypertension, diabetes, hyperlipidemia, coronary artery disease status post angioplasty in 1985 who was seen in the emergency room on 04/12/2019 for possible dehydration after working outside in the heat. His wife said they spent about 7 hours in the ER was given intravenous fluids and sent home. He returned with chills, fatigue, cough, and dyspnea and a CT scan of the chest showed right lower lobe pneumonia. High-sensitivity troponin was mildly elevated at 136 and subsequent values were 150 and 1,963. He developed progressive respiratory failure with hypoxia and tachypnea and required intubation in the emergency room. He was seen by PCCM.He was started on antibiotics. A post intubation chest x-ray showed central pulmonary edema. He was extubated the following day and did well initially and then developed acute respiratory distress requiring increased oxygen and diuresis. He had a stat echocardiogram which showed ejection fraction of 25 to 30% although it was poor quality. There is no significant valvular dysfunction. He underwent cardiac catheterization today showing severe three-vessel coronary disease. Results of his right heart catheterization arenot in the cath note although says that there was mild pulmonary hypertension and normal LVEDP.  The patient's wife is with him today. He denies any history of chest pain or pressure. He said that he stays active working in his yard. He has occasional episodes of exertional shortness of breath but stops to rest and it goes away and he resumes his activity. He feels like his energy  level has been good.   Hospital Course:   On 05/27/2019 Cameron Daniel underwent a CABG x 3 with Dr. Cyndia Bent.  He was transferred to the surgical ICU for continued care.  He was  extubated in a timely manner.  Heart failure was consulted to assist with milrinone titration.  Intra-Op estimated ejection fraction was 45 to 50%.  Postop day 1 we were able to wean him off of his epinephrine.  We continued milrinone for now.  We gave some albumin and Lasix to stimulate diuresis.  We transitioned his insulin drip to Levemir and sliding scale insulin.  We discontinued his chest tubes.  We initiated Lovenox therapy for DVT prophylaxis.  Postop day 2 his co-ox was 56% therefore we able to decrease milrinone to 0.125.  He was given a dose of IV Lasix.  We continue to encourage use of his incentive spirometer.  Postop day 3 his only complaint was regarding his pain in his buttocks from sitting in the chair.  We continue diuresis due to fluid overload.  His co-ox increased to 67%, therefore we discontinued milrinone at this time.  We will continue to encourage ambulation and incentive spirometer use.  His Levemir dose was decreased due to hypoglycemia.  Postop day 4 he was off all of his drips.  He felt like he was recovering well.  His kidney function remained stable.  His Lasix was discontinued but he remained on spironolactone.  He was stable at this time to transfer to the stepdown unit for continued care.  On the stepdown unit, his two-view chest x-ray showed Decreased left basilar and right upper lobe atelectasis and interval minimal right basilar atelectasis.  Today, he is ambulating with limited assistance, tolerating room air, his incisions are healing well, and he is ready for discharge.  05/07/19 Patient is here today for follow-up.  He feels extremely weak and tired after his open heart surgery which is to be expected.  The sternal incision appears to be healing well with no evidence of secondary cellulitis.  He does have a large hematoma on his right flank and just above his right gluteus.  However this appears to be fading.  He is tender to palpation in that area.  He did have a  retroperitoneal hematoma after his catheterization.  His hemoglobin at discharge was 10.  He is due to recheck that today.  I discussed his case with his cardiologist who recommended starting him on Jardiance for his diabetes given the cardiovascular protective effects which certainly is reasonable.  Previously the patient had Candida balanitis and also dizziness associated with Jardiance however he understands the positive implications of this medication and he is willing to try it again.  He would like clotrimazole cream to have on hand in case he develops a rash on the glans penis.  He does report polyuria ever since starting the medication.  We also discontinued his pioglitazone given the reduced ejection fraction that the patient had in the hospital and the pulmonary edema.  I felt that medication was too dangerous for him to be taking now.  He denies any shortness of breath.  He denies any cough.  He denies any fever.  He denies any dysuria.  He is ambulating using a walker for balance.  He denies any leg swelling or evidence of a DVT. Past Medical History:  Diagnosis Date  . Allergic rhinitis, cause unspecified   . Allergy   . Anxiety   . Arthritis   . Cancer (  [place])   . Cataract   . Coronary atherosclerosis of unspecified type of vessel, native or graft    a. Details unclear, someone previously wrote "1985 , arthroplasty x 1."   . Depression    PTSD   . Diverticulosis of colon (without mention of hemorrhage)   . Esophageal reflux   . Esophageal stricture   . Essential hypertension   . Flushing   . Hemorrhoids   . Hyperlipidemia   . Irritable bowel syndrome   . Myocardial infarction ([place])   . Nocturia   . Obesity, unspecified   . Other acquired absence of organ   . Other malaise and fatigue   . Personal history of colonic polyps   . Psychosexual dysfunction with inhibited sexual excitement   . Type II or unspecified type diabetes mellitus without mention of complication, not stated  as uncontrolled    Past Surgical History:  Procedure Laterality Date  . ADENOIDECTOMY    . CARPAL TUNNEL RELEASE     left   . CHOLECYSTECTOMY    . COLON RESECTION     18 inches  . COLON SURGERY  2013  . CORONARY ANGIOPLASTY     1985   . CORONARY ARTERY BYPASS GRAFT N/A 04/26/2019   Procedure: CORONARY ARTERY BYPASS GRAFTING (CABG) x Three, using left internal mammary artery and right leg greater saphenous vein harvested endoscopically;  Surgeon: Gaye Pollack, MD;  Location: [facility];  Service: Open Heart Surgery;  Laterality: N/A;  . ELBOW BURSA SURGERY    . EYE SURGERY    . POLYPECTOMY    . RIGHT/LEFT HEART CATH AND CORONARY ANGIOGRAPHY N/A 04/16/2019   Procedure: RIGHT/LEFT HEART CATH AND CORONARY ANGIOGRAPHY;  Surgeon: Belva Crome, MD;  Location: [facility];  Service: Cardiovascular;  Laterality: N/A;  . SHOULDER SURGERY     Rt. Shoulder  . TEE WITHOUT CARDIOVERSION N/A 04/26/2019   Procedure: TRANSESOPHAGEAL ECHOCARDIOGRAM (TEE);  Surgeon: Gaye Pollack, MD;  Location: [facility];  Service: Open Heart Surgery;  Laterality: N/A;  . TONSILLECTOMY     Current Outpatient Medications on File Prior to Visit  Medication Sig Dispense Refill  . aspirin EC 325 MG EC tablet Take 1 tablet (325 mg total) by mouth daily. 30 tablet 0  . carvedilol (COREG) 3.125 MG tablet Take 1 tablet (3.125 mg total) by mouth 2 (two) times daily with a meal. 60 tablet 1  . Cholecalciferol (VITAMIN D3) 1000 units CAPS Take 1,000 Units by mouth daily.    . citalopram (CELEXA) 10 MG tablet Take 2 tablets (20 mg total) by mouth daily. (Patient taking differently: Take 10 mg by mouth daily. ) 180 tablet 3  . colestipol (COLESTID) 5 g packet Take 5 g by mouth 2 (two) times daily.    . empagliflozin (JARDIANCE) 25 MG TABS tablet Take 25 mg by mouth daily before breakfast. 90 tablet 1  . fexofenadine (ALLEGRA) 180 MG tablet Take 1 tablet (180 mg total) by mouth daily as needed. For allergies (Patient taking  differently: Take 180 mg by mouth daily as needed for allergies. ) 90 tablet 0  . finasteride (PROSCAR) 5 MG tablet Take 1 tablet (5 mg total) by mouth daily. 90 tablet 3  . fluticasone (FLONASE) 50 MCG/ACT nasal spray SPRAY 2 SPRAYS INTO EACH NOSTRIL EVERY DAY 48 mL 2  . glipiZIDE (GLUCOTROL) 5 MG tablet TAKE 1 TABLET (5 MG TOTAL) BY MOUTH DAILY BEFORE BREAKFAST. 90 tablet 1  . Multiple Vitamin (MULTI-VITAMINS)  TABS Take 1 capsule by mouth daily.    Marland Kitchen oxyCODONE (OXY IR/ROXICODONE) 5 MG immediate release tablet Take 1 tablet (5 mg total) by mouth every 6 (six) hours as needed for severe pain. 30 tablet 0  . pantoprazole (PROTONIX) 40 MG tablet TAKE 1 TABLET (40 MG TOTAL) BY MOUTH EVERY MORNING. (Patient taking differently: Take 40 mg by mouth daily as needed (heartburn). ) 90 tablet 3  . rosuvastatin (CRESTOR) 40 MG tablet Take 1 tablet (40 mg total) by mouth daily at 6 PM. 30 tablet 1  . spironolactone (ALDACTONE) 25 MG tablet Take 0.5 tablets (12.5 mg total) by mouth daily. 30 tablet 1  . testosterone cypionate (DEPOTESTOSTERONE CYPIONATE) 200 MG/ML injection INJECT 1 ML EVERY 14 DAYS (Patient not taking: Reported on 05/07/2019) 6 mL 3   No current facility-administered medications on file prior to visit.    Allergies  Allergen Reactions  . Doxazosin Mesylate     UNKNOWN REACTION  . Flomax [Tamsulosin Hcl] Itching       . Jardiance [Empagliflozin] Nausea Only and Other (See Comments)    dizziness - Severe (per pt)  . Penicillins Rash and Other (See Comments)    Has patient had a PCN reaction causing immediate rash, facial/tongue/throat swelling, SOB or lightheadedness with hypotension: No Has patient had a PCN reaction causing severe rash involving mucus membranes or skin necrosis: No Has patient had a PCN reaction that required hospitalization No Has patient had a PCN reaction occurring within the last 10 years: No If all of the above answers are "NO", then may proceed with Cephalosporin  use.   . Sulfonamide Derivatives Rash  . Trulicity [Dulaglutide] Nausea And Vomiting    N&V, Dizziness   Social History   Socioeconomic History  . Marital status: Married    Spouse name: Not on file  . Number of children: 1  . Years of education: Not on file  . Highest education level: Not on file  Occupational History  . 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 Financial resource strain: Not on file  . Food insecurity    Worry: Not on file    Inability: Not on file  . Transportation needs    Medical: Not on file    Non-medical: Not on file  Tobacco Use  . Smoking status: Former Smoker    Quit date: 08/30/1983    Years since quitting: 35.7  . Smokeless tobacco: Former Systems developer  . Tobacco comment: Quit 30 years ago.  Substance and Sexual Activity  . Alcohol use: Yes    Comment: rare  . Drug use: No  . Sexual activity: Yes  Lifestyle  . Physical activity    Days per week: Not on file    Minutes per session: Not on file  . Stress: Not on file  Relationships  . Social Herbalist on phone: Not on file    Gets together: Not on file    Attends religious service: Not on file    Active member of club or organization: Not on file    Attends meetings of clubs or organizations: Not on file    Relationship status: Not on file  . Intimate partner violence    Fear of current or ex partner: Not on file    Emotionally abused: Not on file    Physically abused: Not on file    Forced sexual activity: Not on file  Other Topics Concern  . Not on file  Social History  Narrative  . Not on file     Review of Systems  All other systems reviewed and are negative.      Objective:   Physical Exam Vitals signs reviewed.  Constitutional:      General: He is not in acute distress.    Appearance: He is obese. He is not ill-appearing or toxic-appearing.  Cardiovascular:     Rate and Rhythm: Normal rate and regular rhythm.     Pulses: Normal pulses.     Heart sounds: Normal heart  sounds. No murmur. No friction rub. No gallop.   Pulmonary:     Effort: Pulmonary effort is normal. No respiratory distress.     Breath sounds: Normal breath sounds. No stridor. No wheezing, rhonchi or rales.  Chest:     Chest wall: No tenderness.  Abdominal:     General: Abdomen is flat. Bowel sounds are normal. There is no distension.     Palpations: Abdomen is soft. There is no mass.     Tenderness: There is no abdominal tenderness. There is no right CVA tenderness, left CVA tenderness, guarding or rebound.     Hernia: No hernia is present.  Musculoskeletal:     Right lower leg: No edema.     Left lower leg: No edema.  Neurological:     Mental Status: He is alert.           Assessment & Plan:  Non-ST elevation (NSTEMI) myocardial infarction ([place]) - Plan: CBC with Differential/Platelet, COMPLETE METABOLIC PANEL WITH GFR  Acute systolic HF (heart failure) ([place]) - Plan: CBC with Differential/Platelet, COMPLETE METABOLIC PANEL WITH GFR  Controlled type 2 diabetes mellitus with complication, without long-term current use of insulin ([place]) - Plan: CBC with Differential/Platelet, COMPLETE METABOLIC PANEL WITH GFR  ASCVD (arteriosclerotic cardiovascular disease) - Plan: CBC with Differential/Platelet, COMPLETE METABOLIC PANEL WITH GFR  Hospital discharge follow-up - Plan: CBC with Differential/Platelet, COMPLETE METABOLIC PANEL WITH GFR  Switch the patient to Jardiance and discontinue pioglitazone given his congestive heart failure and recent non-ST elevation myocardial infarction.  I believe the long-term cardiovascular protective effects outweigh any potential risk.  I did give the patient a prescription for Clotrimazole cream to use in case he develops Candida balanitis.  Recheck a CBC to monitor his anemia.  Recommended a multivitamin with iron to help regenerate the blood loss he experienced in the hospital.  Patient's blood pressure today is well controlled.  Ideally I like the  patient to be on an angiotensin receptor blocker or an ACE inhibitor.  However he feels extremely weak and dizzy right now so I will not start that at this time.  I will recheck the patient in a month and if his strength is improving and he is tolerating the Jardiance we may try a low-dose ACE inhibitor at that time.  Patient received his flu shot today.  Also recommended discontinuation of testosterone given the potential increased risk of cardiovascular events on this medication.

## 2019-05-08 ENCOUNTER — Other Ambulatory Visit: Payer: Self-pay

## 2019-05-08 ENCOUNTER — Encounter (HOSPITAL_COMMUNITY): Payer: Self-pay | Admitting: Internal Medicine

## 2019-05-08 ENCOUNTER — Ambulatory Visit (HOSPITAL_COMMUNITY)
Admission: RE | Admit: 2019-05-08 | Discharge: 2019-05-08 | Disposition: A | Discharge status: Home / Self Care | Payer: Medicare HMO | Source: Ambulatory Visit | Attending: Internal Medicine | Admitting: Internal Medicine

## 2019-05-08 VITALS — BP 122/76 | HR 77 | Wt 227.5 lb

## 2019-05-08 DIAGNOSIS — I5022 Chronic systolic (congestive) heart failure: Secondary | ICD-10-CM | POA: Diagnosis not present

## 2019-05-08 DIAGNOSIS — I252 Old myocardial infarction: Secondary | ICD-10-CM | POA: Diagnosis not present

## 2019-05-08 DIAGNOSIS — I110 Hypertensive heart disease with heart failure: Secondary | ICD-10-CM | POA: Diagnosis not present

## 2019-05-08 DIAGNOSIS — Z882 Allergy status to sulfonamides status: Secondary | ICD-10-CM | POA: Diagnosis not present

## 2019-05-08 DIAGNOSIS — Z880 Allergy status to penicillin: Secondary | ICD-10-CM | POA: Insufficient documentation

## 2019-05-08 DIAGNOSIS — Z7984 Long term (current) use of oral hypoglycemic drugs: Secondary | ICD-10-CM | POA: Diagnosis not present

## 2019-05-08 DIAGNOSIS — E785 Hyperlipidemia, unspecified: Secondary | ICD-10-CM | POA: Insufficient documentation

## 2019-05-08 DIAGNOSIS — Z86010 Personal history of colonic polyps: Secondary | ICD-10-CM | POA: Diagnosis not present

## 2019-05-08 DIAGNOSIS — I2510 Atherosclerotic heart disease of native coronary artery without angina pectoris: Secondary | ICD-10-CM | POA: Insufficient documentation

## 2019-05-08 DIAGNOSIS — Z7982 Long term (current) use of aspirin: Secondary | ICD-10-CM | POA: Insufficient documentation

## 2019-05-08 DIAGNOSIS — E119 Type 2 diabetes mellitus without complications: Secondary | ICD-10-CM | POA: Diagnosis not present

## 2019-05-08 DIAGNOSIS — Z87891 Personal history of nicotine dependence: Secondary | ICD-10-CM | POA: Insufficient documentation

## 2019-05-08 DIAGNOSIS — Z8249 Family history of ischemic heart disease and other diseases of the circulatory system: Secondary | ICD-10-CM | POA: Diagnosis not present

## 2019-05-08 DIAGNOSIS — Z951 Presence of aortocoronary bypass graft: Secondary | ICD-10-CM | POA: Insufficient documentation

## 2019-05-08 DIAGNOSIS — Z79899 Other long term (current) drug therapy: Secondary | ICD-10-CM | POA: Diagnosis not present

## 2019-05-08 LAB — COMPLETE METABOLIC PANEL WITH GFR
AG Ratio: 1.9 (calc) (ref 1.0–2.5)
ALT: 34 U/L (ref 9–46)
AST: 45 U/L — ABNORMAL HIGH (ref 10–35)
Albumin: 3.9 g/dL (ref 3.6–5.1)
Alkaline phosphatase (APISO): 103 U/L (ref 35–144)
BUN: 20 mg/dL (ref 7–25)
CO2: 26 mmol/L (ref 20–32)
Calcium: 10.2 mg/dL (ref 8.6–10.3)
Chloride: 99 mmol/L (ref 98–110)
Creat: 1.01 mg/dL (ref 0.70–1.18)
GFR, Est African American: 86 mL/min/{1.73_m2} (ref >=60)
GFR, Est Non African American: 74 mL/min/{1.73_m2} (ref >=60)
Globulin: 2.1 g/dL (calc) (ref 1.9–3.7)
Glucose, Bld: 112 mg/dL — ABNORMAL HIGH (ref 65–99)
Potassium: 5.1 mmol/L (ref 3.5–5.3)
Sodium: 135 mmol/L (ref 135–146)
Total Bilirubin: 1.2 mg/dL (ref 0.2–1.2)
Total Protein: 6 g/dL — ABNORMAL LOW (ref 6.1–8.1)

## 2019-05-08 LAB — CBC WITH DIFFERENTIAL/PLATELET
Absolute Monocytes: 648 cells/uL (ref 200–950)
Basophils Absolute: 73 cells/uL (ref 0–200)
Basophils Relative: 0.9 %
Eosinophils Absolute: 680 cells/uL — ABNORMAL HIGH (ref 15–500)
Eosinophils Relative: 8.4 %
HCT: 38 % — ABNORMAL LOW (ref 38.5–50.0)
Hemoglobin: 12.2 g/dL — ABNORMAL LOW (ref 13.2–17.1)
Lymphs Abs: 2219 cells/uL (ref 850–3900)
MCH: 29.5 pg (ref 27.0–33.0)
MCHC: 32.1 g/dL (ref 32.0–36.0)
MCV: 92 fL (ref 80.0–100.0)
MPV: 9.5 fL (ref 7.5–12.5)
Monocytes Relative: 8 %
Neutro Abs: 4479 cells/uL (ref 1500–7800)
Neutrophils Relative %: 55.3 %
Platelets: 401 10*3/uL — ABNORMAL HIGH (ref 140–400)
RBC: 4.13 10*6/uL — ABNORMAL LOW (ref 4.20–5.80)
RDW: 16 % — ABNORMAL HIGH (ref 11.0–15.0)
Total Lymphocyte: 27.4 %
WBC: 8.1 10*3/uL (ref 3.8–10.8)

## 2019-05-08 MED ORDER — OLMESARTAN MEDOXOMIL 20 MG PO TABS: 20.0000 mg | ORAL_TABLET | Freq: Every day | ORAL | 3 refills | Status: DC

## 2019-05-08 NOTE — Patient Instructions (Signed)
Stop Spironolactone.  Start Benicar 20mg  daily. (start Friday 05/10/2019)  Please have labs (bmet) drawn in 1 week with Dr.Pickard.  You have been referred to Cardiac Rehabilitation.   Follow up in 4 weeks with Dr.Bensimhon

## 2019-05-09 ENCOUNTER — Telehealth (HOSPITAL_COMMUNITY): Payer: Self-pay | Admitting: *Deleted

## 2019-05-09 ENCOUNTER — Ambulatory Visit (INDEPENDENT_AMBULATORY_CARE_PROVIDER_SITE_OTHER): Payer: Self-pay | Admitting: *Deleted

## 2019-05-09 ENCOUNTER — Telehealth: Payer: Self-pay

## 2019-05-09 DIAGNOSIS — Z4802 Encounter for removal of sutures: Secondary | ICD-10-CM

## 2019-05-09 DIAGNOSIS — K661 Hemoperitoneum: Secondary | ICD-10-CM

## 2019-05-09 DIAGNOSIS — Z951 Presence of aortocoronary bypass graft: Secondary | ICD-10-CM

## 2019-05-09 DIAGNOSIS — I2510 Atherosclerotic heart disease of native coronary artery without angina pectoris: Secondary | ICD-10-CM

## 2019-05-09 NOTE — Telephone Encounter (Signed)
Received phone call from pt wife who wanted to clarify that she would like her husband to go to [facility] for his cardiac rehab verses [facility]. Asked if pt was going to get [place] authorization or use his [employer].  Pt wife was unsure.  Pr does not have any follow up with the [place] in the near future.  Pt wife asked if they would have to pay out of pocket if they use [employer].  Advised that we would have to verify his insurance benefits and eligibility.  Pt wife would like for [place] to check with [place] medicare first.  Depending on the cost out of pocket if any decide which to do. Pt has completed his follow up appt with cardiology - Bensimhon and will follow up with [facility] 9/30.  Will follow up. Cherre Huger, BSN Cardiac and Training and development officer

## 2019-05-09 NOTE — Progress Notes (Signed)
Cameron Daniel has returned for staple removal s/p CABG X 3 on 04/26/19 after a 14 day hospital stay.  He is using a walker at present.  Appetite is good.  Bowels "are not to his liking" and he will be taking miralax today and beginning a stool softener.  He saw Dr. [name] yesterday.  He will begin Benicar tomorrow and has been referred to cardiac rehab.  On exam, his sternal incision is well healed.  His right lower leg EVH incision is also well healed but does have some swelling around it which probably is fluid filled.  I reassured him, told him this may absorb on it's on or could be aspirated at subsequent visits.  He was instructed to notify the office if it became hot to touch, started to drain, became reddened or he had a fever.  He and his wife agreed.  His three previous chest tube sites are well healed and the sutures were easily removed.  He will return as scheduled with a CXR...of note the site of the right retroperitoneal hematoma continues to clear.

## 2019-05-09 NOTE — Telephone Encounter (Signed)
I left a voicemail for the patient to see if he needs an OV with Dr Verlin Fester, if so I need to request a new [facility] auth as his has expired.

## 2019-05-13 ENCOUNTER — Telehealth: Payer: Self-pay | Admitting: Family Medicine

## 2019-05-13 ENCOUNTER — Other Ambulatory Visit: Payer: Self-pay | Admitting: Family Medicine

## 2019-05-13 ENCOUNTER — Encounter: Payer: Self-pay | Admitting: Physician Assistant

## 2019-05-13 MED ORDER — NYSTATIN 100000 UNIT/GM EX CREA: 1.0000 "application " | TOPICAL_CREAM | Freq: Three times a day (TID) | CUTANEOUS | 0 refills | Status: DC

## 2019-05-13 MED ORDER — LIDOCAINE-HYDROCORTISONE ACE 3-2.5 % RE KIT: 1.0000 "application " | PACK | Freq: Two times a day (BID) | RECTAL | 1 refills | Status: DC

## 2019-05-13 NOTE — Progress Notes (Signed)
Cardiology Office Note    Date:  05/15/2019   ID:  Sakib, Noguez [birth year], MRN [id number]  PCP:  [name], MD  Cardiologist:  [name], MD  Electrophysiologist:  None   Chief Complaint: post CABG f/u  History of Present Illness:   Cameron Daniel is a 73 y.o. male with history of CAD ([place] with unclear details, then CABG 03/2019 with LIMA -> LAD, SVG -> OM, SVG to dRCA with ischemic cardiomyopathy/acute systolic CHF and spontaneous RP bleed), recent PNA, HTN, DM2, HL, orthostasis, GERD, former tobacco abuse, diverticulosis, IBS, depression, esophageal stricture who presents for post-CABG f/u.  To recap he had MI in 1985 with unclear intervention. Aortoiliac duplex 2014 showed no AAA. He has not required any further cardiac testing in recent years until recent admission. 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 In late August he developed fever and malaise. He was seen in the ER and given fluids and discharged but returned back with respiratory failure requiring intubation.He was seen by PCCM.He was started on antibiotics.A post intubation chest x-ray showed central pulmonary edema and PNA. 2D echo 04/13/19 showed EF 25-30%, severe hypokinesis of the left ventricular, entire inferior wall and anteroseptal wall, mild LAE, small pericardial effusion. He ruled in for NSTEMI. He underwent cardiac catheterization showing severe three-vessel coronary disease with low output. He was started on milrinone post-cath. He developed a large spontaneous RP bleed post cath (cath all done from arm). He was treated with milrinone and antibiotics. His Crestor was titrated. He ultimately underwent CABG on 04/26/19. Pre-op TEE EF 45-50%. Intra-op TEE showed EF 45-50%, mild LVH. Post CABG course was fortunately uneventful. He was started on spironolactone in the hospital but not restarted on Benicar. He saw Dr. [name] in f/u 05/08/19 at which time  spironolactone was stopped (K 5.1) and Benicar was restarted. Last labs 05/07/19 showed K 5.1, Cr 1.01, AST 45, ALT wnl, Hgb 12.2 (improving), plt count 401, 03/2019 TSH wnl, no recent lipids on file.  He returns for general cardiology follow-up feeling well without any CP, SOB, orthopnea, edema. He is here with his wife. His biggest complaint is a hemorrhoid related to constipation he's had. He was trying Miralax with no relief. He then found a [employer] cramp-free laxative supplement (which is MagOx 511m) and he's taking 1 tablet nightly with smooth BMs since then. The hemorrhoid is taking a while to resolve - symptoms of pain/burning. Dr. PDennard Schaumannsent in an rx for hemorrhoid cream but it was $300 so they have not picked it up. [place], so I recommended they speak with primary care about potentially routing rx there instead.   Past Medical History:  Diagnosis Date   Allergic rhinitis, cause unspecified    Allergy    Anxiety    Arthritis    Cancer ([place]    Cataract    Chronic systolic CHF (congestive heart failure) ([place] [facility]    Coronary atherosclerosis of unspecified type of vessel, native or graft    a. MI 1985 with unclear details. b. CABG 03/2019 with LIMA -> LAD, SVG -> OM, SVG to dRCA.   Depression    PTSD    Diverticulosis of colon (without mention of hemorrhage)    Esophageal reflux    Esophageal stricture    Essential hypertension    Former tobacco use    Hemorrhoids    Hyperlipidemia  Irritable bowel syndrome    Ischemic cardiomyopathy    Myocardial infarction (HCC)    Nocturia    Obesity, unspecified    Orthostasis    Other acquired absence of organ    Personal history of colonic polyps    Psychosexual dysfunction with inhibited sexual excitement    Retroperitoneal bleed    Type II or unspecified type diabetes mellitus without mention of complication, not stated as uncontrolled     Past Surgical History:    Procedure Laterality Date   ADENOIDECTOMY     CARPAL TUNNEL RELEASE     left    CHOLECYSTECTOMY     COLON RESECTION     18 inches   COLON SURGERY  2013   CORONARY ANGIOPLASTY     1985    CORONARY ARTERY BYPASS GRAFT N/A 04/26/2019   Procedure: CORONARY ARTERY BYPASS GRAFTING (CABG) x Three, using left internal mammary artery and right leg greater saphenous vein harvested endoscopically;  Surgeon: Gaye Pollack, MD;  Location: [facility];  Service: Open Heart Surgery;  Laterality: N/A;   ELBOW BURSA SURGERY     EYE SURGERY     POLYPECTOMY     RIGHT/LEFT HEART CATH AND CORONARY ANGIOGRAPHY N/A 04/16/2019   Procedure: RIGHT/LEFT HEART CATH AND CORONARY ANGIOGRAPHY;  Surgeon: Belva Crome, MD;  Location: [facility];  Service: Cardiovascular;  Laterality: N/A;   SHOULDER SURGERY     Rt. Shoulder   TEE WITHOUT CARDIOVERSION N/A 04/26/2019   Procedure: TRANSESOPHAGEAL ECHOCARDIOGRAM (TEE);  Surgeon: Gaye Pollack, MD;  Location: [facility];  Service: Open Heart Surgery;  Laterality: N/A;   TONSILLECTOMY      Current Medications: Current Meds  Medication Sig   aspirin EC 325 MG EC tablet Take 1 tablet (325 mg total) by mouth daily.   carvedilol (COREG) 3.125 MG tablet Take 1 tablet (3.125 mg total) by mouth 2 (two) times daily with a meal.   citalopram (CELEXA) 10 MG tablet Take 10 mg by mouth daily.   colestipol (COLESTID) 5 g packet Take 5 g by mouth 2 (two) times daily.   docusate sodium (COLACE) 100 MG capsule Take 200 mg by mouth daily.   empagliflozin (JARDIANCE) 25 MG TABS tablet Take 25 mg by mouth daily before breakfast.   fexofenadine (ALLEGRA) 180 MG tablet Take 1 tablet (180 mg total) by mouth daily as needed. For allergies   finasteride (PROSCAR) 5 MG tablet Take 1 tablet (5 mg total) by mouth daily.   fluticasone (FLONASE) 50 MCG/ACT nasal spray SPRAY 2 SPRAYS INTO EACH NOSTRIL EVERY DAY   glipiZIDE (GLUCOTROL) 5 MG tablet TAKE 1 TABLET (5 MG  TOTAL) BY MOUTH DAILY BEFORE BREAKFAST.   Lidocaine-Hydrocortisone Ace 3-2.5 % KIT Place 1 application rectally 2 (two) times daily.   Magnesium Oxide 500 MG (LAX) TABS Take 500 mg by mouth daily.   Multiple Vitamin (MULTI-VITAMINS) TABS Take 1 capsule by mouth daily.   nystatin cream (MYCOSTATIN) Apply 1 application topically 3 (three) times daily.   olmesartan (BENICAR) 20 MG tablet Take 1 tablet (20 mg total) by mouth daily.   oxycodone (OXY-IR) 5 MG capsule Take 5 mg by mouth every 4 (four) hours as needed.   pantoprazole (PROTONIX) 40 MG tablet TAKE 1 TABLET (40 MG TOTAL) BY MOUTH EVERY MORNING.   rosuvastatin (CRESTOR) 40 MG tablet Take 1 tablet (40 mg total) by mouth daily at 6 PM.   Allergies:   Doxazosin mesylate, Flomax [tamsulosin hcl], Jardiance [empagliflozin],  Penicillins, Sulfonamide derivatives, and Trulicity [dulaglutide]   Social History   Socioeconomic History   Marital status: Married    Spouse name: Not on file   Number of children: 1   Years of education: Not on file   Highest education level: Not on file  Occupational History   Occupation: Lawns  Social Needs   Financial resource strain: Not on file   Food insecurity    Worry: Not on file    Inability: Not on file   Transportation needs    Medical: Not on file    Non-medical: Not on file  Tobacco Use   Smoking status: Former Smoker    Quit date: 08/30/1983    Years since quitting: 35.7   Smokeless tobacco: Former Systems developer   Tobacco comment: Quit 30 years ago.  Substance and Sexual Activity   Alcohol use: Yes    Comment: rare   Drug use: No   Sexual activity: Yes  Lifestyle   Physical activity    Days per week: Not on file    Minutes per session: Not on file   Stress: Not on file  Relationships   Social connections    Talks on phone: Not on file    Gets together: Not on file    Attends religious service: Not on file    Active member of club or organization: Not on file     Attends meetings of clubs or organizations: Not on file    Relationship status: Not on file  Other Topics Concern   Not on file  Social History Narrative   Not on file     Family History:  The patient's family history includes Allergic rhinitis in his father; Coronary artery disease in his mother; Lung cancer in his father. There is no history of Colon cancer, Heart attack, Hypertension, Stroke, Esophageal cancer, Rectal cancer, Stomach cancer, or Pancreatic cancer.  ROS:   Please see the history of present illness.   All other systems are reviewed and otherwise negative.    EKGs/Labs/Other Studies Reviewed:    Studies reviewed were summarized above.   EKG:  EKG is not ordered today. Reviewed last tracing in Epic, NSR 75bpm LAFB no acute STT changes (05/08/19).  Recent Labs: 04/12/2019: B Natriuretic Peptide 359.3 04/21/2019: TSH 2.142 04/27/2019: Magnesium 1.7 05/07/2019: ALT 34; BUN 20; Creat 1.01; Hemoglobin 12.2; Platelets 401; Potassium 5.1; Sodium 135  Recent Lipid Panel    Component Value Date/Time   CHOL 145 02/15/2019 1050   TRIG 260 (H) 02/15/2019 1050   TRIG 193 (H) 08/08/2006 0924   HDL 55 02/15/2019 1050   CHOLHDL 2.6 02/15/2019 1050   VLDL 40 (H) 12/15/2016 0815   LDLCALC 59 02/15/2019 1050   LDLDIRECT 49.9 08/26/2013 1011    PHYSICAL EXAM:    VS:  BP (!) 120/52    Pulse 87    Ht 5' 11" (1.803 m)    Wt 223 lb 6.4 oz (101.3 kg)    SpO2 97%    BMI 31.16 kg/m   BMI: Body mass index is 31.16 kg/m.  GEN: Well nourished, well developed obese WM, in no acute distress HEENT: normocephalic, atraumatic Neck: no JVD, carotid bruits, or masses Cardiac: RRR; no murmurs, rubs, or gallops, no edema  Respiratory:  clear to auscultation bilaterally, normal work of breathing GI: soft, nontender, nondistended, + BS MS: no deformity or atrophy Skin: warm and dry, no rash - incision sites c/d/i without suppuration or dehiscence Neuro:  Alert and Oriented  x 3, Strength and  sensation are intact, follows commands Psych: euthymic mood, full affect  Wt Readings from Last 3 Encounters:  05/15/19 223 lb 6.4 oz (101.3 kg)  05/08/19 227 lb 8 oz (103.2 kg)  05/07/19 230 lb (104.3 kg)     ASSESSMENT & PLAN:   1. CAD s/p CABG 03/2019 - clinically doing well. Continue ASA, statin, BB. He had spontaneous RP bleed in the hospital which is why I suspect he was not treated with Plavix (troponin peaked at 1963). He is tolerating full dose ASA with stable Hgb last week. He is eager to participate in cardiac rehab. F/u planned with cardiothoracic surgery, appreciate their input on when patient would be safe to reduce ASA to 28m daily. 2. Chronic systolic CHF with ischemic cardiomyopathy - clinically appears euvolemic and tolerating all meds well. At 9/9 f/u, Dr.Bensimhon recommended repeat BMET 1 week - will get today while he's here (along with LFTs given recent mild abnormality). Reviewed 2g sodium restriction, 2L fluid restriction, daily weights on AVS. 3. Essential HTN - BP well controlled. Continue present regimen. 4. Hyperlipidemia - Crestor was titrated from 10->457m He will be due for repeat lipids next month. He is going to see Dr. PiDennard Schaumannround that time so he would like to find out from Dr. PiDennard Schaumannf he will just draw while he's there. I told him if for any reason this doesn't occur, to let usKoreanow and we can draw in our office.  5. Constipation - he has found relief taking PhHardin NegusagOx 50043mnce daily (the insert says take 2-4 daily but he fortunately has found that only 1 tablet daily is working decPrintmakerHe only takes Colace sporadically as 100m58md not do much. While he is healing from his hemorrhoid will have him take Colace 100mg38mablets daily for preventative stool softening.  Disposition: F/u with Dr. NelsoMeda Coffee months.  Medication Adjustments/Labs and Tests Ordered: Current medicines are reviewed at length with the patient today.  Concerns regarding  medicines are outlined above. Medication changes, Labs and Tests ordered today are summarized above and listed in the Patient Instructions accessible in Encounters.   Signed, DaynaCharlie PitterC  05/15/2019 1:56 PM    [facility] [address] [id number]: [phone]: [phone]

## 2019-05-13 NOTE — Telephone Encounter (Signed)
Patient would like to know if he can get something for his hemorrhoid pain  [facility] [address]  [phone]

## 2019-05-14 ENCOUNTER — Telehealth (HOSPITAL_COMMUNITY): Payer: Self-pay

## 2019-05-14 NOTE — Telephone Encounter (Signed)
Discuss with wife in ov on 05/13/19 and med sent into pharm via Dr. Dennard Schaumann

## 2019-05-14 NOTE — Telephone Encounter (Signed)
Pt insurance is active and benefits verified through [facility]. Co-pay $45.00, DED $0.00/$0.00 met, out of pocket $4,200.00/$564.64 met, co-insurance 0%. No pre-authorization required. [place]/[employer], 05/14/2019 @ 3:54PM, IDP#[id number]

## 2019-05-14 NOTE — Telephone Encounter (Signed)
Called and spoke with wife Ricki, went over insurance benefits. Verbalized understanding.   Patient will come in for orientation on 05/30/2019 @ 2PM and will attend the 3PM exercise class.  Mailed letter.

## 2019-05-15 ENCOUNTER — Ambulatory Visit (INDEPENDENT_AMBULATORY_CARE_PROVIDER_SITE_OTHER): Payer: Medicare HMO | Admitting: Physician Assistant

## 2019-05-15 ENCOUNTER — Other Ambulatory Visit: Payer: Self-pay

## 2019-05-15 ENCOUNTER — Encounter: Payer: Self-pay | Admitting: Physician Assistant

## 2019-05-15 VITALS — BP 120/52 | HR 87 | Ht 71.0 in | Wt 223.4 lb

## 2019-05-15 DIAGNOSIS — I2510 Atherosclerotic heart disease of native coronary artery without angina pectoris: Secondary | ICD-10-CM | POA: Diagnosis not present

## 2019-05-15 DIAGNOSIS — E119 Type 2 diabetes mellitus without complications: Secondary | ICD-10-CM | POA: Diagnosis not present

## 2019-05-15 DIAGNOSIS — K5900 Constipation, unspecified: Secondary | ICD-10-CM

## 2019-05-15 DIAGNOSIS — E785 Hyperlipidemia, unspecified: Secondary | ICD-10-CM | POA: Diagnosis not present

## 2019-05-15 DIAGNOSIS — I5022 Chronic systolic (congestive) heart failure: Secondary | ICD-10-CM

## 2019-05-15 DIAGNOSIS — I10 Essential (primary) hypertension: Secondary | ICD-10-CM | POA: Diagnosis not present

## 2019-05-15 NOTE — Patient Instructions (Addendum)
Medication Instructions:  Your physician recommends that you continue on your current medications as directed. Please refer to the Current Medication list given to you today. The instructions for the Colace is to take 2 tablets once daily for constipation   If you need a refill on your cardiac medications before your next appointment, please call your pharmacy.   Lab work: TODAY:  CMET  If you have labs (blood work) drawn today and your tests are completely normal, you will receive your results only by: Marland Kitchen MyChart Message (if you have MyChart) OR . A paper copy in the mail If you have any lab test that is abnormal or we need to change your treatment, we will call you to review the results.  Testing/Procedures: None ordered  Follow-Up: At [facility], you and your health needs are our priority.  As part of our continuing mission to provide you with exceptional heart care, we have created designated Provider Care Teams.  These Care Teams include your primary Cardiologist (physician) and Advanced Practice Providers (APPs -  Physician Assistants and Nurse Practitioners) who all work together to provide you with the care you need, when you need it. . You will need a follow-up appointment in 4 months with Dr. Meda Coffee.  You will receive a letter in the mail when it's time to call and schedule.   Any Other Special Instructions Will Be Listed Below (If Applicable).  One of your heart tests in the hospital showed weakness of the heart muscle this admission. This may make you more susceptible to weight gain from fluid retention, which can lead to symptoms that we call heart failure. Please follow these special instructions:  1. Follow a low-salt diet - you are allowed no more than 2,000mg  of sodium per day. Watch your fluid intake. In general, you should not be taking in more than 2 liters of fluid per day (no more than 8 glasses per day). This includes sources of water in foods like soup, coffee,  tea, milk, etc. 2. Weigh yourself on the same scale at same time of day and keep a log. 3. Call your doctor: (Anytime you feel any of the following symptoms)  - 3lb weight gain overnight or 5lb within a few days - Shortness of breath, with or without a dry hacking cough  - Swelling in the hands, feet or stomach  - If you have to sleep on extra pillows at night in order to breathe  IT IS IMPORTANT TO LET YOUR DOCTOR KNOW EARLY ON IF YOU ARE HAVING SYMPTOMS SO WE CAN HELP YOU!  Please talk to Dr. Samella Parr office about options for your hemorrhoids. When you go in for your follow-up with him in October, that would be good timing to recheck your follow up cholesterol panel and liver function - please discuss with him at that visit. If he is unable to check it at that time for whatever reason, please call us and we will arrange in our office.

## 2019-05-16 ENCOUNTER — Telehealth: Payer: Self-pay | Admitting: Physician Assistant

## 2019-05-16 LAB — COMPREHENSIVE METABOLIC PANEL
ALT: 27 IU/L (ref 0–44)
AST: 35 IU/L (ref 0–40)
Albumin/Globulin Ratio: 2.2 (ref 1.2–2.2)
Albumin: 4.3 g/dL (ref 3.7–4.7)
Alkaline Phosphatase: 110 IU/L (ref 39–117)
BUN/Creatinine Ratio: 15 (ref 10–24)
BUN: 15 mg/dL (ref 8–27)
Bilirubin Total: 0.7 mg/dL (ref 0.0–1.2)
CO2: 22 mmol/L (ref 20–29)
Calcium: 10.4 mg/dL — ABNORMAL HIGH (ref 8.6–10.2)
Chloride: 98 mmol/L (ref 96–106)
Creatinine, Ser: 0.97 mg/dL (ref 0.76–1.27)
GFR calc Af Amer: 90 mL/min/{1.73_m2} (ref >59)
GFR calc non Af Amer: 78 mL/min/{1.73_m2} (ref >59)
Globulin, Total: 2 g/dL (ref 1.5–4.5)
Glucose: 110 mg/dL — ABNORMAL HIGH (ref 65–99)
Potassium: 4.8 mmol/L (ref 3.5–5.2)
Sodium: 137 mmol/L (ref 134–144)
Total Protein: 6.3 g/dL (ref 6.0–8.5)

## 2019-05-16 MED ORDER — CLOPIDOGREL BISULFATE 75 MG PO TABS: 75.0000 mg | ORAL_TABLET | Freq: Every day | ORAL | 3 refills | Status: DC

## 2019-05-16 MED ORDER — ASPIRIN EC 81 MG PO TBEC: 81.0000 mg | DELAYED_RELEASE_TABLET | Freq: Every day | ORAL | 3 refills | Status: AC

## 2019-05-16 NOTE — Telephone Encounter (Signed)
I spoke to the patient and shared Dayna Dunn's recommendation.  He verbalized understanding and will start Plavix 75 mg and ASA 81 mg Daily.

## 2019-05-16 NOTE — Telephone Encounter (Signed)
Please call patient. Please let him know I reached out to Dr. [name] to discuss his blood thinner regimen. Since he had officially ruled in for a small heart attack during his recent admission, he should be considered for combination antiplatelet therapy. He was not started on the Plavix in the hospital because he had had spontaneous bleeding episode into his retroperitoneum. He needed time to recover from that and fortunately was doing significantly better in follow-up yesterday without evidence for recurrence on physical examination. Hemoglobin came back up on 05/07/19 recheck as well.  Please let him know Dr. [name] agrees he is now far enough out from his bypass events that he would like him to start Plavix. So please ask him to decrease aspirin from 325mg  to 81mg  daily, and send in Plavix 75mg  once daily.  Please review this comment with the patient as well - "If you notice any bleeding such as blood in stool, black tarry stools, blood in urine, nosebleeds or any other unusual bleeding, call your doctor immediately. It is not normal to have this kind of bleeding while on a blood thinner and usually indicates there is an underlying problem with one of your body systems that needs to be checked out."  He also has a lab note under CMET yesterday if you want to relay those results too. I sent that to Rico Junker but I think she's out.  Dayna Dunn PA-C

## 2019-05-17 ENCOUNTER — Telehealth: Payer: Self-pay | Admitting: Family Medicine

## 2019-05-17 MED ORDER — PRAMOXINE HCL (PERIANAL) 1 % EX FOAM: 1.0000 "application " | Freq: Three times a day (TID) | CUTANEOUS | 3 refills | Status: DC | PRN

## 2019-05-17 NOTE — Telephone Encounter (Signed)
Sent over protocfoam as pt has used in past - if ins doesn't cover he will call back on monday

## 2019-05-17 NOTE — Telephone Encounter (Signed)
Pt states that his lat lab check his calcium was elevated and wanted you to look at it and see is he needed to do anything different or come back in to have rechecked.

## 2019-05-17 NOTE — Telephone Encounter (Signed)
Patient is calling to talk to you about the medication that was just called in for him, it was 306.00 and also needs to ask a question about calcium  [phone]

## 2019-05-20 MED ORDER — PRAMOXINE HCL (PERIANAL) 1 % EX FOAM: 1.0000 "application " | Freq: Three times a day (TID) | CUTANEOUS | 3 refills | Status: DC | PRN

## 2019-05-20 NOTE — Telephone Encounter (Signed)
That level is mild and likely not clinically significant.  We will recheck it when he comes to follow up with me next time.

## 2019-05-20 NOTE — Telephone Encounter (Signed)
Pt aware.

## 2019-05-23 ENCOUNTER — Telehealth (HOSPITAL_COMMUNITY): Payer: Self-pay | Admitting: Pharmacist

## 2019-05-27 ENCOUNTER — Telehealth: Payer: Self-pay | Admitting: Family Medicine

## 2019-05-27 ENCOUNTER — Other Ambulatory Visit: Payer: Self-pay | Admitting: Surgery

## 2019-05-27 ENCOUNTER — Ambulatory Visit
Admission: RE | Admit: 2019-05-27 | Discharge: 2019-05-27 | Disposition: A | Discharge status: Home / Self Care | Payer: No Typology Code available for payment source | Source: Ambulatory Visit | Attending: Surgery | Admitting: Surgery

## 2019-05-27 DIAGNOSIS — Z951 Presence of aortocoronary bypass graft: Secondary | ICD-10-CM

## 2019-05-27 MED ORDER — EMPAGLIFLOZIN 25 MG PO TABS: 25.0000 mg | ORAL_TABLET | Freq: Every day | ORAL | 3 refills | Status: DC

## 2019-05-27 MED ORDER — ROSUVASTATIN CALCIUM 40 MG PO TABS: 40.0000 mg | ORAL_TABLET | Freq: Every day | ORAL | 3 refills | Status: DC

## 2019-05-27 MED ORDER — CARVEDILOL 3.125 MG PO TABS: 3.1250 mg | ORAL_TABLET | Freq: Two times a day (BID) | ORAL | 3 refills | Status: DC

## 2019-05-27 NOTE — Telephone Encounter (Signed)
Medication called/sent to requested pharmacy  

## 2019-05-27 NOTE — Telephone Encounter (Signed)
Patient requesting refill on his Carvedilol, jardiance, and [facility]suvastatin [facility] [address] [facility] [address] [facility]. In [place].  CB# [phone]

## 2019-05-28 ENCOUNTER — Telehealth: Payer: Self-pay

## 2019-05-28 NOTE — Telephone Encounter (Signed)
Patient contacted the office asking if he was ok to drive.  Patient is s/p CABG x3 04/26/19 with Dr. Cyndia Bent.  Patient has a follow-up appointment with Dr. Cyndia Bent tomorrow, 05/29/2019.  Advised patient to wait until tomorrow for advise as to if he is ok to drive.  Patient's usually advised to wait 6 weeks after surgery for sternal healing and no pain medications.  Patient acknowledged receipt.

## 2019-05-28 NOTE — Telephone Encounter (Signed)
Cardiac Rehab Medication Review by a Pharmacist  Does the patient  feel that his/her medications are working for him/her?  yes  Has the patient been experiencing any side effects to the medications prescribed?  no  Does the patient measure his/her own blood pressure or blood glucose at home?  yes  Average BP -  120/77 Average BG - 130-150 fasting in the morning   Does the patient have any problems obtaining medications due to transportation or finances?   no  Understanding of regimen: good Understanding of indications: excellent Potential of compliance: excellent    Pharmacist comments: none   Thank you,   Eddie Candle, PharmD PGY-1 Pharmacy Resident  05/28/2019 4:10 PM

## 2019-05-29 ENCOUNTER — Other Ambulatory Visit: Payer: Self-pay

## 2019-05-29 ENCOUNTER — Ambulatory Visit (INDEPENDENT_AMBULATORY_CARE_PROVIDER_SITE_OTHER): Payer: Self-pay | Admitting: Surgery

## 2019-05-29 ENCOUNTER — Ambulatory Visit: Payer: Self-pay | Admitting: Surgery

## 2019-05-29 ENCOUNTER — Encounter: Payer: Self-pay | Admitting: Surgery

## 2019-05-29 VITALS — BP 115/68 | HR 79 | Temp 97.7°F | Resp 18 | Ht 71.0 in | Wt 218.0 lb

## 2019-05-29 DIAGNOSIS — Z951 Presence of aortocoronary bypass graft: Secondary | ICD-10-CM

## 2019-05-29 NOTE — Progress Notes (Signed)
HPI: Patient returns for routine postoperative follow-up having undergone coronary artery bypass graft surgery x3 on 04/26/2019. He presented with right lower lobe pneumonia and ruled in for non-ST segment elevation MI and subsequently developed progressive hypoxemia and respiratory failure requiring intubation in the emergency room with a post intubation x-ray showing central pulmonary edema. His echocardiogram showed an ejection fraction of 25% although it was a poor quality study. Cardiac catheterization showed severe multivessel coronary disease with high-grade calcified stenoses in the LAD, left circumflex, and right coronary arteries. His coronary vessels are diffusely calcified and have a diabetic vessel appearance.  Right heart catheterization showed compensated filling pressures with low cardiac output.  The patient was evaluated by Dr. [name] in the advanced heart failure team.  He was started on milrinone with improvement in his Co-ox.  It was felt that coronary bypass graft surgery was the best treatment for him.  Unfortunately he developed a retroperitoneal bleed after catheterization with a significant drop in his hematocrit requiring transfusion and discontinuation of anticoagulation.  He recovered from this and looked better after further tune up.  His surgery went well and his ejection fraction looked better on his intraoperative TEE at 45 to 50%.  The patient's early postoperative recovery while in the hospital was notable for an uncomplicated postoperative course.  Since hospital discharge the patient reports that he has been feeling well.  He is walking daily without chest pain or shortness of breath.  His stamina is much better.  He has been watching his diet and has lost about 30 pounds so far.   Current Outpatient Medications  Medication Sig Dispense Refill  . acetaminophen (TYLENOL) 500 MG tablet Take 1,000 mg by mouth at bedtime as needed for mild pain.    Marland Kitchen aspirin  EC 81 MG tablet Take 1 tablet (81 mg total) by mouth daily. 90 tablet 3  . carvedilol (COREG) 3.125 MG tablet Take 1 tablet (3.125 mg total) by mouth 2 (two) times daily with a meal. 180 tablet 3  . citalopram (CELEXA) 10 MG tablet Take 10 mg by mouth daily.    . clopidogrel (PLAVIX) 75 MG tablet Take 1 tablet (75 mg total) by mouth daily. 90 tablet 3  . colestipol (COLESTID) 5 g packet Take 5 g by mouth daily.     Marland Kitchen docusate sodium (COLACE) 100 MG capsule Take 200 mg by mouth daily.    . empagliflozin (JARDIANCE) 25 MG TABS tablet Take 25 mg by mouth daily before breakfast. 90 tablet 3  . fexofenadine (ALLEGRA) 180 MG tablet Take 1 tablet (180 mg total) by mouth daily as needed. For allergies 90 tablet 0  . finasteride (PROSCAR) 5 MG tablet Take 1 tablet (5 mg total) by mouth daily. 90 tablet 3  . fluticasone (FLONASE) 50 MCG/ACT nasal spray SPRAY 2 SPRAYS INTO EACH NOSTRIL EVERY DAY 48 mL 2  . glipiZIDE (GLUCOTROL) 5 MG tablet TAKE 1 TABLET (5 MG TOTAL) BY MOUTH DAILY BEFORE BREAKFAST. 90 tablet 1  . Lidocaine-Hydrocortisone Ace 3-2.5 % KIT Place 1 application rectally 2 (two) times daily. 1 kit 1  . Magnesium Oxide 500 MG (LAX) TABS Take 500 mg by mouth daily.    . Multiple Vitamin (MULTI-VITAMINS) TABS Take 1 capsule by mouth daily.    Marland Kitchen nystatin cream (MYCOSTATIN) Apply 1 application topically 3 (three) times daily. 60 g 0  . olmesartan (BENICAR) 20 MG tablet Take 1 tablet (20 mg total) by mouth daily. 30 tablet 3  .  oxycodone (OXY-IR) 5 MG capsule Take 5 mg by mouth every 4 (four) hours as needed.    . pantoprazole (PROTONIX) 40 MG tablet TAKE 1 TABLET (40 MG TOTAL) BY MOUTH EVERY MORNING. 90 tablet 3  . pramoxine (PROCTOFOAM) 1 % foam Place 1 application rectally 3 (three) times daily as needed for anal itching. 15 g 3  . rosuvastatin (CRESTOR) 40 MG tablet Take 1 tablet (40 mg total) by mouth daily at 6 PM. 90 tablet 3   No current facility-administered medications for this visit.      Physical Exam: BP 115/68 (BP Location: Right Arm, Patient Position: Sitting, Cuff Size: Large)   Pulse 79   Temp 97.7 F (36.5 C)   Resp 18   Ht '5\' 11"'$  (1.803 m)   Wt 218 lb (98.9 kg)   SpO2 95% Comment: RA  BMI 30.40 kg/m  He looks well. Cardiac exam shows regular rate and rhythm with normal heart sounds. Lungs clear Chest incision is healing well and sternum is stable. His right leg incision is well-healed.  There is a small hematoma or seroma deep to the incision medial to the right knee but it is soft and there is no sign of infection. There is no peripheral edema.  Diagnostic Tests:  CLINICAL DATA:  Status post CABG  EXAM: CHEST - 2 VIEW  COMPARISON:  05/01/2019  FINDINGS: Status post median sternotomy and CABG. Both lungs are clear. Disc degenerative disease of the thoracic spine.  IMPRESSION: No acute abnormality of the lungs status post median sternotomy and CABG.   Electronically Signed   By: Eddie Candle M.D.   On: 05/27/2019 13:04   Impression:  Overall recovery following coronary bypass surgery.  I told him we could return to driving a car at this time but should refrain lifting anything heavier than 10 pounds for 3 months postoperatively.  He appears very motivated to modify his cardiac risk factors.  I encouraged him to continue walking as much as possible and he is planning on participating in cardiac regurgitation. He has a follow-up appointment next week with Dr. [name].  He will return to see me if he has any problems with his incisions.  Plan:  He will return to see me as needed if there are any problems with his incisions.   Gaye Pollack, MD [facility] [phone]

## 2019-05-30 ENCOUNTER — Encounter (HOSPITAL_COMMUNITY)
Admission: RE | Admit: 2019-05-30 | Discharge: 2019-05-30 | Disposition: A | Discharge status: Home / Self Care | Payer: Medicare HMO | Source: Ambulatory Visit | Attending: Cardiology | Admitting: Cardiology

## 2019-05-30 ENCOUNTER — Encounter (HOSPITAL_COMMUNITY): Payer: Self-pay

## 2019-05-30 VITALS — BP 116/70 | HR 86 | Temp 97.9°F | Ht 69.5 in | Wt 222.0 lb

## 2019-05-30 DIAGNOSIS — I214 Non-ST elevation (NSTEMI) myocardial infarction: Secondary | ICD-10-CM | POA: Diagnosis not present

## 2019-05-30 DIAGNOSIS — Z951 Presence of aortocoronary bypass graft: Secondary | ICD-10-CM | POA: Insufficient documentation

## 2019-05-30 LAB — GLUCOSE, CAPILLARY: Glucose-Capillary: 144 mg/dL — ABNORMAL HIGH (ref 70–99)

## 2019-05-30 NOTE — Progress Notes (Signed)
Cardiac Individual Treatment Plan  Patient Details  Name: Cameron Daniel MRN: [id number] Date of Birth: 11/26/1945 Referring Provider:     [place] from 05/30/2019 in [place]  Referring Provider  Dr. 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  acetaminophen (TYLENOL) 500 MG tablet, Take 1,000 mg by mouth at bedtime as needed for mild pain., Disp: , Rfl:  .  aspirin EC 81 MG tablet, Take 1 tablet (81 mg total) by mouth daily., Disp: 90 tablet, Rfl: 3 .  carvedilol (COREG) 3.125 MG tablet, Take 1 tablet (3.125 mg total) by mouth 2 (two) times daily with a meal., Disp: 180 tablet, Rfl: 3 .  citalopram (CELEXA) 10 MG tablet, Take 10 mg by mouth daily., Disp: , Rfl:  .  clopidogrel (PLAVIX) 75 MG tablet, Take 1 tablet (75 mg total) by mouth daily., Disp: 90 tablet, Rfl: 3 .  colestipol (COLESTID) 5 g packet, Take 5 g by mouth daily. , Disp: , Rfl:  .  docusate sodium (COLACE) 100 MG capsule, Take 200 mg by mouth daily., Disp: , Rfl:  .  empagliflozin (JARDIANCE) 25 MG TABS tablet, Take 25 mg by mouth daily before breakfast., Disp: 90 tablet, Rfl: 3 .  fexofenadine (ALLEGRA) 180 MG tablet, Take 1 tablet (180 mg total) by mouth daily as needed. For allergies, Disp: 90 tablet, Rfl: 0 .  finasteride (PROSCAR) 5 MG tablet, Take 1 tablet (5 mg total) by mouth daily., Disp: 90 tablet, Rfl: 3 .  fluticasone (FLONASE) 50 MCG/ACT nasal spray, SPRAY 2 SPRAYS INTO EACH NOSTRIL EVERY DAY, Disp: 48 mL, Rfl: 2 .  glipiZIDE (GLUCOTROL) 5 MG tablet, TAKE 1 TABLET (5 MG TOTAL) BY MOUTH DAILY BEFORE BREAKFAST., Disp: 90 tablet, Rfl: 1 .   Lidocaine-Hydrocortisone Ace 3-2.5 % KIT, Place 1 application rectally 2 (two) times daily., Disp: 1 kit, Rfl: 1 .  Magnesium Oxide 500 MG (LAX) TABS, Take 500 mg by mouth daily., Disp: , Rfl:  .  Multiple Vitamin (MULTI-VITAMINS) TABS, Take 1 capsule by mouth daily., Disp: , Rfl:  .  nystatin cream (MYCOSTATIN), Apply 1 application topically 3 (three) times daily., Disp: 60 g, Rfl: 0 .  olmesartan (BENICAR) 20 MG tablet, Take 1 tablet (20 mg total) by mouth daily., Disp: 30 tablet, Rfl: 3 .  oxycodone (OXY-IR) 5 MG capsule, Take 5 mg by mouth every 4 (four) hours as needed., Disp: , Rfl:  .  pantoprazole (PROTONIX) 40 MG tablet, TAKE 1 TABLET (40 MG TOTAL) BY MOUTH EVERY MORNING., Disp: 90 tablet, Rfl: 3 .  pramoxine (PROCTOFOAM) 1 % foam, Place 1 application rectally 3 (three) times daily as needed for anal itching., Disp: 15 g, Rfl: 3 .  rosuvastatin (CRESTOR) 40 MG tablet, Take 1 tablet (40 mg total) by mouth daily at 6 PM., Disp: 90 tablet, Rfl: 3  Past Medical History: Past Medical History:  Diagnosis Date  . Allergic rhinitis, cause unspecified   . Allergy   . Anxiety   . Arthritis   . Cancer ([place])   . Cataract   . Chronic systolic CHF (congestive heart failure) ([place])   . Coronary atherosclerosis of  unspecified type of vessel, native or graft    a. MI 1985 with unclear details. b. CABG 03/2019 with LIMA -> LAD, SVG -> OM, SVG to dRCA.  . Depression    PTSD   . Diverticulosis of colon (without mention of hemorrhage)   . Esophageal reflux   . Esophageal stricture   . Essential hypertension   . Former tobacco use   . Hemorrhoids   . Hyperlipidemia   . Irritable bowel syndrome   . Ischemic cardiomyopathy   . Myocardial infarction ([place])   . Nocturia   . Obesity, unspecified   . Orthostasis   . Other acquired absence of organ   . Personal history of colonic polyps   . Psychosexual dysfunction with inhibited sexual excitement   . Retroperitoneal bleed   . Type II or  unspecified type diabetes mellitus without mention of complication, not stated as uncontrolled     Tobacco Use: Social History   Tobacco Use  Smoking Status Former Smoker  . Quit date: 08/30/1983  . Years since quitting: 35.7  Smokeless Tobacco Former Systems developer  Tobacco Comment   Quit 30 years ago.    Labs: Recent Review Flowsheet Data    Labs for ITP Cardiac and Pulmonary Rehab Latest Ref Rng & Units 04/26/2019 04/27/2019 04/28/2019 04/29/2019 04/30/2019   Cholestrol <200 mg/dL - - - - -   LDLCALC mg/dL (calc) - - - - -   LDLDIRECT mg/dL - - - - -   HDL > OR = 40 mg/dL - - - - -   Trlycerides <150 mg/dL - - - - -   Hemoglobin A1c 4.8 - 5.6 % - - - - -   PHART 7.350 - 7.450 7.379 - - - -   PCO2ART 32.0 - 48.0 mmHg 43.7 - - - -   HCO3 20.0 - 28.0 mmol/L 25.7 - - - -   TCO2 22 - 32 mmol/L 27 25 - - -   ACIDBASEDEF 0.0 - 2.0 mmol/L - - - - -   O2SAT % 97.0 53.9 55.8 67.4 62.7      Capillary Blood Glucose: Lab Results  Component Value Date   GLUCAP 144 (H) 05/30/2019   GLUCAP 143 (H) 05/02/2019   GLUCAP 99 05/02/2019   GLUCAP 145 (H) 05/01/2019   GLUCAP 163 (H) 05/01/2019     Exercise Target Goals: Exercise Program Goal: Individual exercise prescription set using results from initial 6 min walk test and THRR while considering  patient's activity barriers and safety.   Exercise Prescription Goal: Starting with aerobic activity 30 plus minutes a day, 3 days per week for initial exercise prescription. Provide home exercise prescription and guidelines that participant acknowledges understanding prior to discharge.  Activity Barriers & Risk Stratification: Activity Barriers & Cardiac Risk Stratification - 05/30/19 1534      Activity Barriers & Cardiac Risk Stratification   Activity Barriers  Back Problems;Deconditioning;Muscular Weakness;Balance Concerns   Right hip and knee pain   Cardiac Risk Stratification  High       6 Minute Walk: 6 Minute Walk    Row Name 05/30/19 1533          6 Minute Walk   Phase  Initial     Distance  1000 feet     Walk Time  6 minutes     # of Rest Breaks  0     MPH  1.8     METS  1.9     RPE  11  Perceived Dyspnea   0     VO2 Peak  6.9     Symptoms  No     Resting HR  86 bpm     Resting BP  116/70     Resting Oxygen Saturation   100 %     Exercise Oxygen Saturation  during 6 min walk  95 %     Max Ex. HR  105 bpm     Max Ex. BP  122/68     2 Minute Post BP  110/62        Oxygen Initial Assessment:   Oxygen Re-Evaluation:   Oxygen Discharge (Final Oxygen Re-Evaluation):   Initial Exercise Prescription: Initial Exercise Prescription - 05/30/19 1500      Date of Initial Exercise RX and Referring Provider   Date  05/30/19    Referring Provider  Dr. Felicity Pellegrini    Expected Discharge Date  07/26/19      NuStep   Level  2    SPM  75    Minutes  15    METs  1.8      Arm Ergometer   Level  1.5    Watts  15    Minutes  15    METs  1.79      Prescription Details   Frequency (times per week)  2    Duration  Progress to 30 minutes of continuous aerobic without signs/symptoms of physical distress      Intensity   THRR 40-80% of Max Heartrate  59-118    Ratings of Perceived Exertion  11-13      Progression   Progression  Continue to progress workloads to maintain intensity without signs/symptoms of physical distress.      Resistance Training   Training Prescription  Yes    Weight  3 lbs.     Reps  10-15       Perform Capillary Blood Glucose checks as needed.  Exercise Prescription Changes:   Exercise Comments:   Exercise Goals and Review: Exercise Goals    Row Name 05/30/19 1538             Exercise Goals   Increase Physical Activity  Yes       Intervention  Provide advice, education, support and counseling about physical activity/exercise needs.;Develop an individualized exercise prescription for aerobic and resistive training based on initial evaluation findings, risk  stratification, comorbidities and participant's personal goals.       Expected Outcomes  Short Term: Attend rehab on a regular basis to increase amount of physical activity.;Long Term: Add in home exercise to make exercise part of routine and to increase amount of physical activity.;Long Term: Exercising regularly at least 3-5 days a week.       Increase Strength and Stamina  Yes       Intervention  Provide advice, education, support and counseling about physical activity/exercise needs.;Develop an individualized exercise prescription for aerobic and resistive training based on initial evaluation findings, risk stratification, comorbidities and participant's personal goals.       Expected Outcomes  Short Term: Increase workloads from initial exercise prescription for resistance, speed, and METs.;Short Term: Perform resistance training exercises routinely during rehab and add in resistance training at home;Long Term: Improve cardiorespiratory fitness, muscular endurance and strength as measured by increased METs and functional capacity (6MWT)       Able to understand and use rate of perceived exertion (RPE) scale  Yes       Intervention  Provide education and explanation on how to use RPE scale       Expected Outcomes  Short Term: Able to use RPE daily in rehab to express subjective intensity level;Long Term:  Able to use RPE to guide intensity level when exercising independently       Knowledge and understanding of Target Heart Rate Range (THRR)  Yes       Intervention  Provide education and explanation of THRR including how the numbers were predicted and where they are located for reference       Expected Outcomes  Short Term: Able to state/look up THRR;Long Term: Able to use THRR to govern intensity when exercising independently;Short Term: Able to use daily as guideline for intensity in rehab       Able to check pulse independently  Yes       Intervention  Provide education and demonstration on how to  check pulse in carotid and radial arteries.;Review the importance of being able to check your own pulse for safety during independent exercise       Expected Outcomes  Short Term: Able to explain why pulse checking is important during independent exercise;Long Term: Able to check pulse independently and accurately       Understanding of Exercise Prescription  Yes       Intervention  Provide education, explanation, and written materials on patient's individual exercise prescription       Expected Outcomes  Short Term: Able to explain program exercise prescription;Long Term: Able to explain home exercise prescription to exercise independently          Exercise Goals Re-Evaluation :    Discharge Exercise Prescription (Final Exercise Prescription Changes):   Nutrition:  Target Goals: Understanding of nutrition guidelines, daily intake of sodium '1500mg'$ , cholesterol '200mg'$ , calories 30% from fat and 7% or less from saturated fats, daily to have 5 or more servings of fruits and vegetables.  Biometrics: Pre Biometrics - 05/30/19 1539      Pre Biometrics   Height  5' 9.5" (1.765 m)    Weight  100.7 kg    Waist Circumference  36.5 inches    Hip Circumference  44 inches    Waist to Hip Ratio  0.83 %    BMI (Calculated)  32.33    Triceps Skinfold  26 mm    % Body Fat  29.8 %    Grip Strength  28 kg    Flexibility  0 in    Single Leg Stand  3.25 seconds        Nutrition Therapy Plan and Nutrition Goals:   Nutrition Assessments:   Nutrition Goals Re-Evaluation:   Nutrition Goals Discharge (Final Nutrition Goals Re-Evaluation):   Psychosocial: Target Goals: Acknowledge presence or absence of significant depression and/or stress, maximize coping skills, provide positive support system. Participant is able to verbalize types and ability to use techniques and skills needed for reducing stress and depression.  Initial Review & Psychosocial Screening: Initial Psych Review &  Screening - 05/30/19 1523      Initial Review   Current issues with  Current Anxiety/Panic   Pt has a history of PTSD r/t his time served in the [employer] in [place].     Family Dynamics   Good Support System?  Yes   Pt states his wife as a source of support.     Barriers   Psychosocial barriers to participate in program  The patient should benefit from training in stress management and relaxation.  Screening Interventions   Interventions  Encouraged to exercise       Quality of Life Scores: Quality of Life - 05/30/19 1509      Quality of Life   Select  Quality of Life      Quality of Life Scores   Health/Function Pre  19.33 %    Socioeconomic Pre  22.5 %    Psych/Spiritual Pre  24.5 %    Family Pre  21.5 %    GLOBAL Pre  21.4 %      Scores of 19 and below usually indicate a poorer quality of life in these areas.  A difference of  2-3 points is a clinically meaningful difference.  A difference of 2-3 points in the total score of the Quality of Life Index has been associated with significant improvement in overall quality of life, self-image, physical symptoms, and general health in studies assessing change in quality of life.  PHQ-9: Recent Review Flowsheet Data    Depression screen [facility] 2/9 05/30/2019 02/15/2019 09/18/2017 12/05/2016 12/16/2014   Decreased Interest 0 0 0 0 0   Down, Depressed, Hopeless 0 0 0 0 0   PHQ - 2 Score 0 0 0 0 0   Altered sleeping - - - 0 -   Tired, decreased energy - - - 0 -   Change in appetite - - - 0 -   Feeling bad or failure about yourself  - - - 0 -   Trouble concentrating - - - 0 -   Moving slowly or fidgety/restless - - - 0 -   Suicidal thoughts - - - 0 -   PHQ-9 Score - - - 0 -   Difficult doing work/chores - - - Not difficult at all -     Interpretation of Total Score  Total Score Depression Severity:  1-4 = Minimal depression, 5-9 = Mild depression, 10-14 = Moderate depression, 15-19 = Moderately severe depression, 20-27 = Severe  depression   Psychosocial Evaluation and Intervention:   Psychosocial Re-Evaluation:   Psychosocial Discharge (Final Psychosocial Re-Evaluation):   Vocational Rehabilitation: Provide vocational rehab assistance to qualifying candidates.   Vocational Rehab Evaluation & Intervention: Vocational Rehab - 05/30/19 1524      Initial Vocational Rehab Evaluation & Intervention   Assessment shows need for Vocational Rehabilitation  No       Education: Education Goals: Education classes will be provided on a weekly basis, covering required topics. Participant will state understanding/return demonstration of topics presented.  Learning Barriers/Preferences: Learning Barriers/Preferences - 05/30/19 1539      Learning Barriers/Preferences   Learning Barriers  Sight;Hearing    Learning Preferences  Video;Pictoral       Education Topics: Hypertension, Hypertension Reduction -Define heart disease and high blood pressure. Discus how high blood pressure affects the body and ways to reduce high blood pressure.   Exercise and Your Heart -Discuss why it is important to exercise, the FITT principles of exercise, normal and abnormal responses to exercise, and how to exercise safely.   Angina -Discuss definition of angina, causes of angina, treatment of angina, and how to decrease risk of having angina.   Cardiac Medications -Review what the following cardiac medications are used for, how they affect the body, and side effects that may occur when taking the medications.  Medications include Aspirin, Beta blockers, calcium channel blockers, ACE Inhibitors, angiotensin receptor blockers, diuretics, digoxin, and antihyperlipidemics.   Congestive Heart Failure -Discuss the definition of CHF, how to live with  CHF, the signs and symptoms of CHF, and how keep track of weight and sodium intake.   Heart Disease and Intimacy -Discus the effect sexual activity has on the heart, how changes occur  during intimacy as we age, and safety during sexual activity.   Smoking Cessation / COPD -Discuss different methods to quit smoking, the health benefits of quitting smoking, and the definition of COPD.   Nutrition I: Fats -Discuss the types of cholesterol, what cholesterol does to the heart, and how cholesterol levels can be controlled.   Nutrition II: Labels -Discuss the different components of food labels and how to read food label   Heart Parts/Heart Disease and PAD -Discuss the anatomy of the heart, the pathway of blood circulation through the heart, and these are affected by heart disease.   Stress I: Signs and Symptoms -Discuss the causes of stress, how stress may lead to anxiety and depression, and ways to limit stress.   Stress II: Relaxation -Discuss different types of relaxation techniques to limit stress.   Warning Signs of Stroke / TIA -Discuss definition of a stroke, what the signs and symptoms are of a stroke, and how to identify when someone is having stroke.   Knowledge Questionnaire Score: Knowledge Questionnaire Score - 05/30/19 1522      Knowledge Questionnaire Score   Pre Score  21/24       Core Components/Risk Factors/Patient Goals at Admission: Personal Goals and Risk Factors at Admission - 05/30/19 1620      Core Components/Risk Factors/Patient Goals on Admission    Weight Management  Yes;Weight Maintenance;Weight Loss    Intervention  Weight Management: Develop a combined nutrition and exercise program designed to reach desired caloric intake, while maintaining appropriate intake of nutrient and fiber, sodium and fats, and appropriate energy expenditure required for the weight goal.;Weight Management: Provide education and appropriate resources to help participant work on and attain dietary goals.;Weight Management/Obesity: Establish reasonable short term and long term weight goals.    Admit Weight  222 lb 0.1 oz (100.7 kg)    Expected Outcomes   Short Term: Continue to assess and modify interventions until short term weight is achieved;Long Term: Adherence to nutrition and physical activity/exercise program aimed toward attainment of established weight goal;Weight Loss: Understanding of general recommendations for a balanced deficit meal plan, which promotes 1-2 lb weight loss per week and includes a negative energy balance of [phone] kcal/d;Understanding recommendations for meals to include 15-35% energy as protein, 25-35% energy from fat, 35-60% energy from carbohydrates, less than '200mg'$  of dietary cholesterol, 20-35 gm of total fiber daily;Weight Maintenance: Understanding of the daily nutrition guidelines, which includes 25-35% calories from fat, 7% or less cal from saturated fats, less than '200mg'$  cholesterol, less than 1.5gm of sodium, & 5 or more servings of fruits and vegetables daily;Understanding of distribution of calorie intake throughout the day with the consumption of 4-5 meals/snacks    Diabetes  Yes    Intervention  Provide education about signs/symptoms and action to take for hypo/hyperglycemia.;Provide education about proper nutrition, including hydration, and aerobic/resistive exercise prescription along with prescribed medications to achieve blood glucose in normal ranges: Fasting glucose 65-99 mg/dL    Expected Outcomes  Short Term: Participant verbalizes understanding of the signs/symptoms and immediate care of hyper/hypoglycemia, proper foot care and importance of medication, aerobic/resistive exercise and nutrition plan for blood glucose control.;Long Term: Attainment of HbA1C < 7%.    Heart Failure  Yes    Expected Outcomes  Short term: Attendance  in program 2-3 days a week with increased exercise capacity. Reported lower sodium intake. Reported increased fruit and vegetable intake. Reports medication compliance.;Short term: Daily weights obtained and reported for increase. Utilizing diuretic protocols set by physician.;Long  term: Adoption of self-care skills and reduction of barriers for early signs and symptoms recognition and intervention leading to self-care maintenance.    Hypertension  Yes    Intervention  Provide education on lifestyle modifcations including regular physical activity/exercise, weight management, moderate sodium restriction and increased consumption of fresh fruit, vegetables, and low fat dairy, alcohol moderation, and smoking cessation.;Monitor prescription use compliance.    Expected Outcomes  Short Term: Continued assessment and intervention until BP is < 140/23m HG in hypertensive participants. < 130/866mHG in hypertensive participants with diabetes, heart failure or chronic kidney disease.;Long Term: Maintenance of blood pressure at goal levels.    Lipids  Yes    Intervention  Provide education and support for participant on nutrition & aerobic/resistive exercise along with prescribed medications to achieve LDL '70mg'$ , HDL >'40mg'$ .    Expected Outcomes  Short Term: Participant states understanding of desired cholesterol values and is compliant with medications prescribed. Participant is following exercise prescription and nutrition guidelines.;Long Term: Cholesterol controlled with medications as prescribed, with individualized exercise RX and with personalized nutrition plan. Value goals: LDL < '70mg'$ , HDL > 40 mg.    Stress  Yes    Intervention  Offer individual and/or small group education and counseling on adjustment to heart disease, stress management and health-related lifestyle change. Teach and support self-help strategies.;Refer participants experiencing significant psychosocial distress to appropriate mental health specialists for further evaluation and treatment. When possible, include family members and significant others in education/counseling sessions.    Expected Outcomes  Short Term: Participant demonstrates changes in health-related behavior, relaxation and other stress management  skills, ability to obtain effective social support, and compliance with psychotropic medications if prescribed.;Long Term: Emotional wellbeing is indicated by absence of clinically significant psychosocial distress or social isolation.       Core Components/Risk Factors/Patient Goals Review:    Core Components/Risk Factors/Patient Goals at Discharge (Final Review):    ITP Comments: ITP Comments    Row Name 05/30/19 1552           ITP Comments  Dr. TrFransico HimMedical Director          Comments: KeYvone Neuttended orientation on 05/30/2019 to review rules and guidelines for program.  Completed 6 minute walk test, Intitial ITP, and exercise prescription.  VSS. Telemetry-Sinus Rhythm .  Asymptomatic. Safety measures and social distancing in place per CDC guidelines.MaBarnet PallRN,BSN 05/30/2019 4:27 PM

## 2019-06-03 ENCOUNTER — Other Ambulatory Visit: Payer: Self-pay

## 2019-06-03 ENCOUNTER — Encounter (HOSPITAL_COMMUNITY)
Admission: RE | Admit: 2019-06-03 | Discharge: 2019-06-03 | Disposition: A | Discharge status: Home / Self Care | Payer: Medicare HMO | Source: Ambulatory Visit | Attending: Internal Medicine | Admitting: Internal Medicine

## 2019-06-03 DIAGNOSIS — I214 Non-ST elevation (NSTEMI) myocardial infarction: Secondary | ICD-10-CM | POA: Diagnosis not present

## 2019-06-03 DIAGNOSIS — Z951 Presence of aortocoronary bypass graft: Secondary | ICD-10-CM

## 2019-06-03 LAB — GLUCOSE, CAPILLARY
Glucose-Capillary: 103 mg/dL — ABNORMAL HIGH (ref 70–99)
Glucose-Capillary: 137 mg/dL — ABNORMAL HIGH (ref 70–99)

## 2019-06-03 NOTE — Progress Notes (Signed)
Daily Session Note  Patient Details  Name: Cameron Daniel MRN: [id number] Date of Birth: [birth year] Referring Provider:     CARDIAC REHAB PHASE II ORIENTATION from 05/30/2019 in [place]  Referring Provider  Dr. Felicity Pellegrini      Encounter Date: 06/03/2019  Check In:   Capillary Blood Glucose: Results for orders placed or performed during the hospital encounter of 06/03/19 (from the past 24 hour(s))  Glucose, capillary     Status: Abnormal   Collection Time: 06/03/19  4:09 PM  Result Value Ref Range   Glucose-Capillary 137 (H) 70 - 99 mg/dL      Social History   Tobacco Use  Smoking Status Former Smoker  . Quit date: 08/30/1983  . Years since quitting: 35.7  Smokeless Tobacco Former Systems developer  Tobacco Comment   Quit 30 years ago.    Goals Met:  No report of cardiac concerns or symptoms  Goals Unmet:  Not Applicable  Comments: [name] started cardiac rehab today.  Pt tolerated light exercise without difficulty. VSS, telemetry-Sinus Rhythm, asymptomatic.  Medication list reconciled. Pt denies barriers to medicaiton compliance.  PSYCHOSOCIAL ASSESSMENT:  PHQ-0. Pt exhibits positive coping skills, hopeful outlook with supportive family. No psychosocial needs identified at this time, no psychosocial interventions necessary.    Pt enjoys fishing.   Pt oriented to exercise equipment and routine.    Understanding verbalized. CBG prior to exercise 103. Patient was given lemonade. Recheck CBG 137 post exercise. Patient ate breakfast at 10:30 then a cookie at 1pm. Patient reminded to eat lunch prior to returning to exercise on Wednesday. States understanding.Barnet Pall, RN,BSN 06/04/2019 4:04 PM   Dr. Fransico Him is Medical Director for Cardiac Rehab at [facility].

## 2019-06-05 ENCOUNTER — Ambulatory Visit (HOSPITAL_COMMUNITY)
Admission: RE | Admit: 2019-06-05 | Discharge: 2019-06-05 | Disposition: A | Discharge status: Home / Self Care | Payer: Medicare HMO | Source: Ambulatory Visit | Attending: Internal Medicine | Admitting: Internal Medicine

## 2019-06-05 ENCOUNTER — Other Ambulatory Visit: Payer: Self-pay

## 2019-06-05 ENCOUNTER — Ambulatory Visit: Payer: Self-pay | Admitting: Surgery

## 2019-06-05 ENCOUNTER — Encounter (HOSPITAL_COMMUNITY)
Admission: RE | Admit: 2019-06-05 | Discharge: 2019-06-05 | Disposition: A | Discharge status: Home / Self Care | Payer: Medicare HMO | Source: Ambulatory Visit | Attending: Internal Medicine | Admitting: Internal Medicine

## 2019-06-05 ENCOUNTER — Encounter (HOSPITAL_COMMUNITY): Payer: Self-pay | Admitting: Internal Medicine

## 2019-06-05 VITALS — BP 130/88 | HR 81 | Wt 222.8 lb

## 2019-06-05 DIAGNOSIS — Z801 Family history of malignant neoplasm of trachea, bronchus and lung: Secondary | ICD-10-CM | POA: Diagnosis not present

## 2019-06-05 DIAGNOSIS — E669 Obesity, unspecified: Secondary | ICD-10-CM | POA: Diagnosis not present

## 2019-06-05 DIAGNOSIS — E785 Hyperlipidemia, unspecified: Secondary | ICD-10-CM | POA: Diagnosis not present

## 2019-06-05 DIAGNOSIS — E1136 Type 2 diabetes mellitus with diabetic cataract: Secondary | ICD-10-CM | POA: Insufficient documentation

## 2019-06-05 DIAGNOSIS — I5022 Chronic systolic (congestive) heart failure: Secondary | ICD-10-CM | POA: Diagnosis not present

## 2019-06-05 DIAGNOSIS — Z7982 Long term (current) use of aspirin: Secondary | ICD-10-CM | POA: Insufficient documentation

## 2019-06-05 DIAGNOSIS — I110 Hypertensive heart disease with heart failure: Secondary | ICD-10-CM | POA: Diagnosis not present

## 2019-06-05 DIAGNOSIS — Z7902 Long term (current) use of antithrombotics/antiplatelets: Secondary | ICD-10-CM | POA: Diagnosis not present

## 2019-06-05 DIAGNOSIS — Z79899 Other long term (current) drug therapy: Secondary | ICD-10-CM | POA: Insufficient documentation

## 2019-06-05 DIAGNOSIS — Z7984 Long term (current) use of oral hypoglycemic drugs: Secondary | ICD-10-CM | POA: Diagnosis not present

## 2019-06-05 DIAGNOSIS — Z951 Presence of aortocoronary bypass graft: Secondary | ICD-10-CM

## 2019-06-05 DIAGNOSIS — K219 Gastro-esophageal reflux disease without esophagitis: Secondary | ICD-10-CM | POA: Insufficient documentation

## 2019-06-05 DIAGNOSIS — E875 Hyperkalemia: Secondary | ICD-10-CM | POA: Insufficient documentation

## 2019-06-05 DIAGNOSIS — I252 Old myocardial infarction: Secondary | ICD-10-CM | POA: Diagnosis not present

## 2019-06-05 DIAGNOSIS — Z8249 Family history of ischemic heart disease and other diseases of the circulatory system: Secondary | ICD-10-CM | POA: Insufficient documentation

## 2019-06-05 DIAGNOSIS — I2510 Atherosclerotic heart disease of native coronary artery without angina pectoris: Secondary | ICD-10-CM | POA: Diagnosis not present

## 2019-06-05 DIAGNOSIS — F419 Anxiety disorder, unspecified: Secondary | ICD-10-CM | POA: Insufficient documentation

## 2019-06-05 DIAGNOSIS — Z86010 Personal history of colonic polyps: Secondary | ICD-10-CM | POA: Insufficient documentation

## 2019-06-05 DIAGNOSIS — Z87891 Personal history of nicotine dependence: Secondary | ICD-10-CM | POA: Insufficient documentation

## 2019-06-05 DIAGNOSIS — M1990 Unspecified osteoarthritis, unspecified site: Secondary | ICD-10-CM | POA: Insufficient documentation

## 2019-06-05 DIAGNOSIS — H269 Unspecified cataract: Secondary | ICD-10-CM | POA: Insufficient documentation

## 2019-06-05 DIAGNOSIS — I255 Ischemic cardiomyopathy: Secondary | ICD-10-CM | POA: Insufficient documentation

## 2019-06-05 DIAGNOSIS — I25810 Atherosclerosis of coronary artery bypass graft(s) without angina pectoris: Secondary | ICD-10-CM | POA: Insufficient documentation

## 2019-06-05 DIAGNOSIS — F329 Major depressive disorder, single episode, unspecified: Secondary | ICD-10-CM | POA: Insufficient documentation

## 2019-06-05 DIAGNOSIS — I214 Non-ST elevation (NSTEMI) myocardial infarction: Secondary | ICD-10-CM

## 2019-06-05 LAB — GLUCOSE, CAPILLARY: Glucose-Capillary: 137 mg/dL — ABNORMAL HIGH (ref 70–99)

## 2019-06-05 NOTE — Progress Notes (Signed)
ADVANCED HF CLINIC NOTE  PCP: Margaretmary Eddy HF Cardiologist:   HPI:  Mr. Klontz is a 73 year old gentleman with history of HTN, DM2, HL, CAD s/p CABG 05/27/19 (LIMA -> LAD, SVG -> OM, SVG to dRCA).  Admitted 8/20 with PNA. Readmitted in mid August 202 with NSTEMI and HF. 2D echo 04/13/19 showed EF 25-30%, severe hypokinesis of the left ventricular, entire inferior wall and anteroseptal wall, mild LAE, small pericardial effusion. He ruled in for NSTEMI. He underwent cardiac catheterization showing severe three-vessel coronary disease with low output. He was started on milrinone post-cath. He developed a large spontaneous RP bleed post cath (cath all done from arm). He was treated with milrinone and antibiotics.  Underwent CABG 03/2019 with LIMA -> LAD, SVG -> OM, SVG to dRCA/ Pre-op TEE EF 45-50%. Intra-op TEE showed EF 45-50%, mild LVH. Post CABG course was fortunately uneventful.   Here for routine f/u. Feels great. Going to CR and doing well. No CP or SOB, orthopnea or PND. No edema. No problems with meds. SBP 110-115.   ROS: All systems negative except as listed in HPI, PMH and Problem List.  SH:  Social History   Socioeconomic History  . Marital status: Married    Spouse name: Not on file  . Number of children: 1  . Years of education: 38  . Highest education level: High school graduate  Occupational History  . 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 Financial resource strain: Not hard at all  . Food insecurity    Worry: Never true    Inability: Never true  . Transportation needs    Medical: No    Non-medical: No  Tobacco Use  . Smoking status: Former Smoker    Quit date: 08/30/1983    Years since quitting: 35.7  . Smokeless tobacco: Former Systems developer  . Tobacco comment: Quit 30 years ago.  Substance and Sexual Activity  . Alcohol use: Yes    Comment: rare  . Drug use: No  . Sexual activity: Yes  Lifestyle  . Physical activity    Days per week: 0 days    Minutes  per session: 0 min  . Stress: Only a little  Relationships  . Social Herbalist on phone: Not on file    Gets together: Not on file    Attends religious service: Not on file    Active member of club or organization: Not on file    Attends meetings of clubs or organizations: Not on file    Relationship status: Not on file  . Intimate partner violence    Fear of current or ex partner: Not on file    Emotionally abused: Not on file    Physically abused: Not on file    Forced sexual activity: Not on file  Other Topics Concern  . Not on file  Social History Narrative  . Not on file    FH:  Family History  Problem Relation Age of Onset  . Lung cancer Father   . Allergic rhinitis Father   . Coronary artery disease Mother   . Colon cancer Neg Hx   . Heart attack Neg Hx   . Hypertension Neg Hx   . Stroke Neg Hx   . Esophageal cancer Neg Hx   . Rectal cancer Neg Hx   . Stomach cancer Neg Hx   . Pancreatic cancer Neg Hx     Past Medical History:  Diagnosis Date  . Allergic rhinitis,  cause unspecified   . Allergy   . Anxiety   . Arthritis   . Cancer ([place])   . Cataract   . Chronic systolic CHF (congestive heart failure) ([place])   . Coronary atherosclerosis of unspecified type of vessel, native or graft    a. MI 1985 with unclear details. b. CABG 03/2019 with LIMA -> LAD, SVG -> OM, SVG to dRCA.  . Depression    PTSD   . Diverticulosis of colon (without mention of hemorrhage)   . Esophageal reflux   . Esophageal stricture   . Essential hypertension   . Former tobacco use   . Hemorrhoids   . Hyperlipidemia   . Irritable bowel syndrome   . Ischemic cardiomyopathy   . Myocardial infarction ([place])   . Nocturia   . Obesity, unspecified   . Orthostasis   . Other acquired absence of organ   . Personal history of colonic polyps   . Psychosexual dysfunction with inhibited sexual excitement   . Retroperitoneal bleed   . Type II or unspecified type diabetes mellitus  without mention of complication, not stated as uncontrolled     Current Outpatient Medications  Medication Sig Dispense Refill  . acetaminophen (TYLENOL) 500 MG tablet Take 1,000 mg by mouth at bedtime as needed for mild pain.    Marland Kitchen aspirin EC 81 MG tablet Take 1 tablet (81 mg total) by mouth daily. 90 tablet 3  . carvedilol (COREG) 3.125 MG tablet Take 1 tablet (3.125 mg total) by mouth 2 (two) times daily with a meal. 180 tablet 3  . citalopram (CELEXA) 10 MG tablet Take 10 mg by mouth daily.    . clopidogrel (PLAVIX) 75 MG tablet Take 1 tablet (75 mg total) by mouth daily. 90 tablet 3  . colestipol (COLESTID) 5 g packet Take 5 g by mouth daily.     Marland Kitchen docusate sodium (COLACE) 100 MG capsule Take 200 mg by mouth daily.    . empagliflozin (JARDIANCE) 25 MG TABS tablet Take 25 mg by mouth daily before breakfast. 90 tablet 3  . fexofenadine (ALLEGRA) 180 MG tablet Take 1 tablet (180 mg total) by mouth daily as needed. For allergies 90 tablet 0  . finasteride (PROSCAR) 5 MG tablet Take 1 tablet (5 mg total) by mouth daily. 90 tablet 3  . fluticasone (FLONASE) 50 MCG/ACT nasal spray SPRAY 2 SPRAYS INTO EACH NOSTRIL EVERY DAY 48 mL 2  . glipiZIDE (GLUCOTROL) 5 MG tablet TAKE 1 TABLET (5 MG TOTAL) BY MOUTH DAILY BEFORE BREAKFAST. 90 tablet 1  . nystatin cream (MYCOSTATIN) Apply 1 application topically 3 (three) times daily. 60 g 0  . olmesartan (BENICAR) 20 MG tablet Take 1 tablet (20 mg total) by mouth daily. 30 tablet 3  . pantoprazole (PROTONIX) 40 MG tablet TAKE 1 TABLET (40 MG TOTAL) BY MOUTH EVERY MORNING. 90 tablet 3  . pramoxine (PROCTOFOAM) 1 % foam Place 1 application rectally 3 (three) times daily as needed for anal itching. 15 g 3  . rosuvastatin (CRESTOR) 40 MG tablet Take 1 tablet (40 mg total) by mouth daily at 6 PM. 90 tablet 3   No current facility-administered medications for this encounter.     Vitals:   06/05/19 1048  BP: 130/88  Pulse: 81  SpO2: 97%  Weight: 101.1 kg (222  lb 12.8 oz)    PHYSICAL EXAM:  General:  Well appearing. No resp difficulty HEENT: normal Neck: supple. no JVD. Carotids 2+ bilat; no bruits. No lymphadenopathy or  thryomegaly appreciated. Cor: PMI nondisplaced. Regular rate & rhythm. No rubs, gallops or murmurs. Lungs: clear Abdomen: soft, nontender, nondistended. No hepatosplenomegaly. No bruits or masses. Good bowel sounds. Extremities: no cyanosis, clubbing, rash, trace edema on right. Small seroma at SVG harvest site Neuro: alert & orientedx3, cranial nerves grossly intact. moves all 4 extremities w/o difficulty. Affect pleasant   ASSESSMENT & PLAN:  1. CAD s/p CABG 05/27/19 - CABG 05/27/19 (LIMA -> LAD, SVG -> OM, SVG to dRCA) - Continue ASA, statin. Plavix and b-blocker - No s/s ischemia - Going to CR  2. Chronic Systolic HF - pre-CABG echo EF 25-30% - improved to 45-50% on intra-op TEE - volume status stable  - Continue jardiance - Continue Benicar 20 - Continue carvedilol 3.125 bid - Not on spiro due to hyperkalemia  3. DM2 - on Jardiance.   4. HL - continue statin. Goal LDL < 70  Doing great. Continue current therapy.   Glori Bickers, MD  11:10 AM

## 2019-06-05 NOTE — Patient Instructions (Signed)
NO changes today!  Your physician recommends that you schedule a follow-up appointment in: 6 months with Dr [name].  We will call you to schedule this appointment.   At the [facility], you and your health needs are our priority. As part of our continuing mission to provide you with exceptional heart care, we have created designated Provider Care Teams. These Care Teams include your primary Cardiologist (physician) and Advanced Practice Providers (APPs- Physician Assistants and Nurse Practitioners) who all work together to provide you with the care you need, when you need it.   You may see any of the following providers on your designated Care Team at your next follow up: Marland Kitchen Dr Glori Bickers . Dr Loralie Champagne . Darrick Grinder, NP   Please be sure to bring in all your medications bottles to every appointment.

## 2019-06-05 NOTE — Addendum Note (Signed)
Encounter addended by: Valeda Malm, RN on: 06/05/2019 11:18 AM  Actions taken: Clinical Note Signed

## 2019-06-06 LAB — GLUCOSE, CAPILLARY: Glucose-Capillary: 128 mg/dL — ABNORMAL HIGH (ref 70–99)

## 2019-06-07 ENCOUNTER — Encounter (HOSPITAL_COMMUNITY): Payer: Medicare HMO

## 2019-06-07 ENCOUNTER — Ambulatory Visit (INDEPENDENT_AMBULATORY_CARE_PROVIDER_SITE_OTHER): Payer: Medicare HMO | Admitting: Family Medicine

## 2019-06-07 ENCOUNTER — Other Ambulatory Visit: Payer: Self-pay

## 2019-06-07 ENCOUNTER — Encounter: Payer: Self-pay | Admitting: Family Medicine

## 2019-06-07 VITALS — BP 136/74 | HR 78 | Temp 97.5°F | Resp 16 | Ht 71.0 in | Wt 222.0 lb

## 2019-06-07 DIAGNOSIS — I5021 Acute systolic (congestive) heart failure: Secondary | ICD-10-CM | POA: Diagnosis not present

## 2019-06-07 DIAGNOSIS — I2510 Atherosclerotic heart disease of native coronary artery without angina pectoris: Secondary | ICD-10-CM | POA: Diagnosis not present

## 2019-06-07 DIAGNOSIS — Z23 Encounter for immunization: Secondary | ICD-10-CM | POA: Diagnosis not present

## 2019-06-07 DIAGNOSIS — E118 Type 2 diabetes mellitus with unspecified complications: Secondary | ICD-10-CM | POA: Diagnosis not present

## 2019-06-07 NOTE — Progress Notes (Signed)
Subjective:    Patient ID: Cameron Daniel, male    DOB: [birth year], 73 y.o.   MRN: [id number]  HPI  Unfortunately, patient was recently admitted to the hospital and was found to have severe three-vessel coronary artery disease as well as congestive heart failure.  I have copied relevant portions of the discharge summary below for my reference:  Admit date: 04/12/2019 Discharge date: 05/02/2019  Admission Diagnoses:      Patient Active Problem List   Diagnosis Date Noted   Hyponatremia 04/20/2019   Retroperitoneal hematoma 04/18/2019   Acute on chronic blood loss anemia [id number]   Acute systolic HF (heart failure) ([place]) 04/15/2019   Non-ST elevation (NSTEMI) myocardial infarction ([place]) 04/15/2019   AKI (acute kidney injury) ([place])    Community acquired pneumonia of right lower lobe of lung ([employer]) 04/12/2019   Acute respiratory failure ([place]) 04/12/2019   Allergic conjunctivitis 01/23/2018   Chronic sinusitis 01/23/2018   Gout 04/06/2012   DIVERTICULITIS-COLON 06/18/2010   FACIAL FLUSHING 12/11/2007   NOCTURIA 12/11/2007   Type 2 diabetes mellitus with hyperlipidemia ([place]) 09/07/2007   OTHER MALAISE AND FATIGUE 09/07/2007   Allergic rhinitis 08/16/2007   HYPERLIPIDEMIA 01/24/2007   OBESITY 01/24/2007   ERECTILE DYSFUNCTION 01/24/2007   HYPERTENSION 01/24/2007   Coronary atherosclerosis 01/24/2007   GERD 01/24/2007   DIVERTICULOSIS, COLON 01/24/2007   IRRITABLE BOWEL SYNDROME 01/24/2007   COLONIC POLYPS, HX OF 01/24/2007   CHOLECYSTECTOMY, HX OF 01/24/2007    Discharge Diagnoses:  Principal Problem:   Non-ST elevation (NSTEMI) myocardial infarction [facility]) Active Problems:   Type 2 diabetes mellitus with hyperlipidemia (HCC)   Coronary atherosclerosis   GERD   Community acquired pneumonia of right lower lobe of lung (HCC)   Acute respiratory failure (HCC)   AKI (acute kidney injury) ([place])   Acute systolic HF (heart failure) (HCC)  Retroperitoneal hematoma   Acute on chronic blood loss anemia   Hyponatremia   S/P CABG x 3   Discharged Condition: good  HPI:  The patient is a 73 year old gentleman with history of hypertension, diabetes, hyperlipidemia, coronary artery disease status post angioplasty in 1985 who was seen in the emergency room on 04/12/2019 for possible dehydration after working outside in the heat. His wife said they spent about 7 hours in the ER was given intravenous fluids and sent home. He returned with chills, fatigue, cough, and dyspnea and a CT scan of the chest showed right lower lobe pneumonia. High-sensitivity troponin was mildly elevated at 136 and subsequent values were 150 and 1,963. He developed progressive respiratory failure with hypoxia and tachypnea and required intubation in the emergency room. He was seen by PCCM.He was started on antibiotics. A post intubation chest x-ray showed central pulmonary edema. He was extubated the following day and did well initially and then developed acute respiratory distress requiring increased oxygen and diuresis. He had a stat echocardiogram which showed ejection fraction of 25 to 30% although it was poor quality. There is no significant valvular dysfunction. He underwent cardiac catheterization today showing severe three-vessel coronary disease. Results of his right heart catheterization arenot in the cath note although says that there was mild pulmonary hypertension and normal LVEDP.  The patient's wife is with him today. He denies any history of chest pain or pressure. He said that he stays active working in his yard. He has occasional episodes of exertional shortness of breath but stops to rest and it goes away and he resumes his activity. He feels like his energy  level has been good.   Hospital Course:   On 05/27/2019 Mr. Purifoy underwent a CABG x 3 with Dr. Cyndia Bent.  He was transferred to the surgical ICU for continued care.  He was  extubated in a timely manner.  Heart failure was consulted to assist with milrinone titration.  Intra-Op estimated ejection fraction was 45 to 50%.  Postop day 1 we were able to wean him off of his epinephrine.  We continued milrinone for now.  We gave some albumin and Lasix to stimulate diuresis.  We transitioned his insulin drip to Levemir and sliding scale insulin.  We discontinued his chest tubes.  We initiated Lovenox therapy for DVT prophylaxis.  Postop day 2 his co-ox was 56% therefore we able to decrease milrinone to 0.125.  He was given a dose of IV Lasix.  We continue to encourage use of his incentive spirometer.  Postop day 3 his only complaint was regarding his pain in his buttocks from sitting in the chair.  We continue diuresis due to fluid overload.  His co-ox increased to 67%, therefore we discontinued milrinone at this time.  We will continue to encourage ambulation and incentive spirometer use.  His Levemir dose was decreased due to hypoglycemia.  Postop day 4 he was off all of his drips.  He felt like he was recovering well.  His kidney function remained stable.  His Lasix was discontinued but he remained on spironolactone.  He was stable at this time to transfer to the stepdown unit for continued care.  On the stepdown unit, his two-view chest x-ray showed Decreased left basilar and right upper lobe atelectasis and interval minimal right basilar atelectasis.  Today, he is ambulating with limited assistance, tolerating room air, his incisions are healing well, and he is ready for discharge.  05/07/19 Patient is here today for follow-up.  He feels extremely weak and tired after his open heart surgery which is to be expected.  The sternal incision appears to be healing well with no evidence of secondary cellulitis.  He does have a large hematoma on his right flank and just above his right gluteus.  However this appears to be fading.  He is tender to palpation in that area.  He did have a  retroperitoneal hematoma after his catheterization.  His hemoglobin at discharge was 10.  He is due to recheck that today.  I discussed his case with his cardiologist who recommended starting him on Jardiance for his diabetes given the cardiovascular protective effects which certainly is reasonable.  Previously the patient had Candida balanitis and also dizziness associated with Jardiance however he understands the positive implications of this medication and he is willing to try it again.  He would like clotrimazole cream to have on hand in case he develops a rash on the glans penis.  He does report polyuria ever since starting the medication.  We also discontinued his pioglitazone given the reduced ejection fraction that the patient had in the hospital and the pulmonary edema.  I felt that medication was too dangerous for him to be taking now.  He denies any shortness of breath.  He denies any cough.  He denies any fever.  He denies any dysuria.  He is ambulating using a walker for balance.  He denies any leg swelling or evidence of a DVT.  At that time, my plan was: Switch the patient to [facility] and discontinue pioglitazone given his congestive heart failure and recent non-ST elevation myocardial infarction.  I  believe the long-term cardiovascular protective effects outweigh any potential risk.  I did give the patient a prescription for Clotrimazole cream to use in case he develops Candida balanitis.  Recheck a CBC to monitor his anemia.  Recommended a multivitamin with iron to help regenerate the blood loss he experienced in the hospital.  Patient's blood pressure today is well controlled.  Ideally I like the patient to be on an angiotensin receptor blocker or an ACE inhibitor.  However he feels extremely weak and dizzy right now so I will not start that at this time.  I will recheck the patient in a month and if his strength is improving and he is tolerating the Jardiance we may try a low-dose ACE inhibitor  at that time.  Patient received his flu shot today.  Also recommended discontinuation of testosterone given the potential increased risk of cardiovascular events on this medication.  06/07/19 Patient looks remarkable today.  He is doing very well.  He is tolerating the Jardiance without any complications however he has been applying nystatin to his glans on a daily basis.  I have discouraged that.  Instead I have recommended that he keep that area dry.  He can use of desiccant powder such as [employer].  I recommended only using nystatin if there is an active infection.  This way we avoid antibiotic resistance in the yeast.  He denies any chest pain.  His surgical scar on sternum is healing well.  He does have a small seroma on his medial right calf that appears today where they took the vein.  However there is no erythema or warmth in that area.  This seems to be slowly improving.  The large hematoma on his back has completely resolved.  He denies any hematuria or hematochezia or melena.  He denies any dyspnea on exertion.  He denies any pleurisy.  He denies any angina.  Blood pressures well controlled today.  My last visit I made mention of starting an ACE inhibitor however he is on Benicar and therefore this is not necessary.  Therefore he is already on a beta-blocker as well as an angiotensin receptor blocker plus Jardiance.  His preoperative ejection fraction was 25 to 30% however his intraoperative TEE showed an ejection fraction up to 50%.  Therefore Delene Loll is likely not necessary with beneficial.  Therefore he is on optimal medical therapy at this time for his congestive heart failure. g Past Medical History:  Diagnosis Date   Allergic rhinitis, cause unspecified    Allergy    Anxiety    Arthritis    Cancer ([place])    Cataract    Chronic systolic CHF (congestive heart failure) ([place])    Coronary atherosclerosis of unspecified type of vessel, native or graft    a. MI 1985 with unclear  details. b. CABG 03/2019 with LIMA -> LAD, SVG -> OM, SVG to dRCA.   Depression    PTSD    Diverticulosis of colon (without mention of hemorrhage)    Esophageal reflux    Esophageal stricture    Essential hypertension    Former tobacco use    Hemorrhoids    Hyperlipidemia    Irritable bowel syndrome    Ischemic cardiomyopathy    Myocardial infarction (HCC)    Nocturia    Obesity, unspecified    Orthostasis    Other acquired absence of organ    Personal history of colonic polyps    Psychosexual dysfunction with inhibited sexual excitement  Retroperitoneal bleed    Type II or unspecified type diabetes mellitus without mention of complication, not stated as uncontrolled    Past Surgical History:  Procedure Laterality Date   ADENOIDECTOMY     CARPAL TUNNEL RELEASE     left    CHOLECYSTECTOMY     COLON RESECTION     18 inches   COLON SURGERY  2013   CORONARY ANGIOPLASTY     1985    CORONARY ARTERY BYPASS GRAFT N/A 04/26/2019   Procedure: CORONARY ARTERY BYPASS GRAFTING (CABG) x Three, using left internal mammary artery and right leg greater saphenous vein harvested endoscopically;  Surgeon: Gaye Pollack, MD;  Location: [facility];  Service: Open Heart Surgery;  Laterality: N/A;   ELBOW BURSA SURGERY     EYE SURGERY     POLYPECTOMY     RIGHT/LEFT HEART CATH AND CORONARY ANGIOGRAPHY N/A 04/16/2019   Procedure: RIGHT/LEFT HEART CATH AND CORONARY ANGIOGRAPHY;  Surgeon: Belva Crome, MD;  Location: [facility];  Service: Cardiovascular;  Laterality: N/A;   SHOULDER SURGERY     Rt. Shoulder   TEE WITHOUT CARDIOVERSION N/A 04/26/2019   Procedure: TRANSESOPHAGEAL ECHOCARDIOGRAM (TEE);  Surgeon: Gaye Pollack, MD;  Location: [facility];  Service: Open Heart Surgery;  Laterality: N/A;   TONSILLECTOMY     Current Outpatient Medications on File Prior to Visit  Medication Sig Dispense Refill   acetaminophen (TYLENOL) 500 MG tablet Take 1,000 mg by  mouth at bedtime as needed for mild pain.     aspirin EC 81 MG tablet Take 1 tablet (81 mg total) by mouth daily. 90 tablet 3   carvedilol (COREG) 3.125 MG tablet Take 1 tablet (3.125 mg total) by mouth 2 (two) times daily with a meal. 180 tablet 3   citalopram (CELEXA) 10 MG tablet Take 10 mg by mouth daily.     clopidogrel (PLAVIX) 75 MG tablet Take 1 tablet (75 mg total) by mouth daily. 90 tablet 3   colestipol (COLESTID) 5 g packet Take 5 g by mouth daily.      docusate sodium (COLACE) 100 MG capsule Take 200 mg by mouth daily.     empagliflozin (JARDIANCE) 25 MG TABS tablet Take 25 mg by mouth daily before breakfast. 90 tablet 3   fexofenadine (ALLEGRA) 180 MG tablet Take 1 tablet (180 mg total) by mouth daily as needed. For allergies 90 tablet 0   finasteride (PROSCAR) 5 MG tablet Take 1 tablet (5 mg total) by mouth daily. 90 tablet 3   fluticasone (FLONASE) 50 MCG/ACT nasal spray SPRAY 2 SPRAYS INTO EACH NOSTRIL EVERY DAY 48 mL 2   glipiZIDE (GLUCOTROL) 5 MG tablet TAKE 1 TABLET (5 MG TOTAL) BY MOUTH DAILY BEFORE BREAKFAST. 90 tablet 1   nystatin cream (MYCOSTATIN) Apply 1 application topically 3 (three) times daily. 60 g 0   olmesartan (BENICAR) 20 MG tablet Take 1 tablet (20 mg total) by mouth daily. 30 tablet 3   pantoprazole (PROTONIX) 40 MG tablet TAKE 1 TABLET (40 MG TOTAL) BY MOUTH EVERY MORNING. 90 tablet 3   pramoxine (PROCTOFOAM) 1 % foam Place 1 application rectally 3 (three) times daily as needed for anal itching. 15 g 3   rosuvastatin (CRESTOR) 40 MG tablet Take 1 tablet (40 mg total) by mouth daily at 6 PM. 90 tablet 3   No current facility-administered medications on file prior to visit.    Allergies  Allergen Reactions   Doxazosin Mesylate  UNKNOWN REACTION   Flomax [Tamsulosin Hcl] Itching        Penicillins Rash and Other (See Comments)    Has patient had a PCN reaction causing immediate rash, facial/tongue/throat swelling, SOB or  lightheadedness with hypotension: No Has patient had a PCN reaction causing severe rash involving mucus membranes or skin necrosis: No Has patient had a PCN reaction that required hospitalization No Has patient had a PCN reaction occurring within the last 10 years: No If all of the above answers are "NO", then may proceed with Cephalosporin use.    Sulfonamide Derivatives Rash   Trulicity [Dulaglutide] Nausea And Vomiting    N&V, Dizziness   Social History   Socioeconomic History   Marital status: Married    Spouse name: Not on file   Number of children: 1   Years of education: 12   Highest education level: High school graduate  Occupational History   Occupation: [employer]  Scientist, product/process development strain: Not hard at all   Food insecurity    Worry: Never true    Inability: Never true   Transportation needs    Medical: No    Non-medical: No  Tobacco Use   Smoking status: Former Smoker    Quit date: 08/30/1983    Years since quitting: 35.7   Smokeless tobacco: Former Systems developer   Tobacco comment: Quit 30 years ago.  Substance and Sexual Activity   Alcohol use: Yes    Comment: rare   Drug use: No   Sexual activity: Yes  Lifestyle   Physical activity    Days per week: 0 days    Minutes per session: 0 min   Stress: Only a little  Relationships   Press photographer on phone: Not on file    Gets together: Not on file    Attends religious service: Not on file    Active member of club or organization: Not on file    Attends meetings of clubs or organizations: Not on file    Relationship status: Not on file   Intimate partner violence    Fear of current or ex partner: Not on file    Emotionally abused: Not on file    Physically abused: Not on file    Forced sexual activity: Not on file  Other Topics Concern   Not on file  Social History Narrative   Not on file     Review of Systems  All other systems reviewed and are negative.       Objective:   Physical Exam Vitals signs reviewed.  Constitutional:      General: He is not in acute distress.    Appearance: He is obese. He is not ill-appearing or toxic-appearing.  Cardiovascular:     Rate and Rhythm: Normal rate and regular rhythm.     Pulses: Normal pulses.     Heart sounds: Normal heart sounds. No murmur. No friction rub. No gallop.   Pulmonary:     Effort: Pulmonary effort is normal. No respiratory distress.     Breath sounds: Normal breath sounds. No stridor. No wheezing, rhonchi or rales.  Chest:     Chest wall: No tenderness.  Abdominal:     General: Abdomen is flat. Bowel sounds are normal. There is no distension.     Palpations: Abdomen is soft. There is no mass.     Tenderness: There is no abdominal tenderness. There is no right CVA tenderness, left CVA tenderness, guarding  or rebound.     Hernia: No hernia is present.  Musculoskeletal:     Right lower leg: No edema.     Left lower leg: No edema.  Neurological:     Mental Status: He is alert.           Assessment & Plan:  ASCVD (arteriosclerotic cardiovascular disease) - Plan: COMPLETE METABOLIC PANEL WITH GFR  Controlled type 2 diabetes mellitus with complication, without long-term current use of insulin (HCC)  Acute systolic HF (heart failure) ([place])  Repeat the CMP today to monitor the patient's calcium.  Calcium is persistently elevated we may need to check his parathyroid hormone status.  His hemoglobin at his last visit was greater than 12 and therefore I will not recheck a CBC today.  His blood pressure is outstanding.  He is reporting fasting blood sugars between 100-120 and his 2-hour postprandial sugars are under 150.  Therefore his diabetes seems well controlled.  I have recommended returning in 3 months to recheck an A1c and a fasting lipid panel.  Otherwise continue his current medical therapy.  Patient failed to get his flu shot at his last visit so he got it today.

## 2019-06-07 NOTE — Addendum Note (Signed)
Addended by: Shary Decamp B on: 06/07/2019 03:33 PM   Modules accepted: Orders

## 2019-06-08 LAB — COMPLETE METABOLIC PANEL WITH GFR
AG Ratio: 2.2 (calc) (ref 1.0–2.5)
ALT: 13 U/L (ref 9–46)
AST: 19 U/L (ref 10–35)
Albumin: 3.9 g/dL (ref 3.6–5.1)
Alkaline phosphatase (APISO): 77 U/L (ref 35–144)
BUN: 11 mg/dL (ref 7–25)
CO2: 29 mmol/L (ref 20–32)
Calcium: 10.2 mg/dL (ref 8.6–10.3)
Chloride: 101 mmol/L (ref 98–110)
Creat: 0.83 mg/dL (ref 0.70–1.18)
GFR, Est African American: 102 mL/min/{1.73_m2} (ref >=60)
GFR, Est Non African American: 88 mL/min/{1.73_m2} (ref >=60)
Globulin: 1.8 g/dL (calc) — ABNORMAL LOW (ref 1.9–3.7)
Glucose, Bld: 98 mg/dL (ref 65–99)
Potassium: 4.5 mmol/L (ref 3.5–5.3)
Sodium: 138 mmol/L (ref 135–146)
Total Bilirubin: 0.5 mg/dL (ref 0.2–1.2)
Total Protein: 5.7 g/dL — ABNORMAL LOW (ref 6.1–8.1)

## 2019-06-10 ENCOUNTER — Encounter (HOSPITAL_COMMUNITY)
Admission: RE | Admit: 2019-06-10 | Discharge: 2019-06-10 | Disposition: A | Discharge status: Home / Self Care | Payer: Medicare HMO | Source: Ambulatory Visit | Attending: Internal Medicine | Admitting: Internal Medicine

## 2019-06-10 ENCOUNTER — Ambulatory Visit: Payer: Medicare HMO | Admitting: Family Medicine

## 2019-06-10 ENCOUNTER — Other Ambulatory Visit: Payer: Self-pay

## 2019-06-10 DIAGNOSIS — Z951 Presence of aortocoronary bypass graft: Secondary | ICD-10-CM

## 2019-06-10 DIAGNOSIS — I214 Non-ST elevation (NSTEMI) myocardial infarction: Secondary | ICD-10-CM

## 2019-06-12 ENCOUNTER — Encounter (HOSPITAL_COMMUNITY)
Admission: RE | Admit: 2019-06-12 | Discharge: 2019-06-12 | Disposition: A | Discharge status: Home / Self Care | Payer: Medicare HMO | Source: Ambulatory Visit | Attending: Internal Medicine | Admitting: Internal Medicine

## 2019-06-12 ENCOUNTER — Other Ambulatory Visit: Payer: Self-pay

## 2019-06-12 DIAGNOSIS — I214 Non-ST elevation (NSTEMI) myocardial infarction: Secondary | ICD-10-CM

## 2019-06-12 DIAGNOSIS — Z951 Presence of aortocoronary bypass graft: Secondary | ICD-10-CM

## 2019-06-13 NOTE — Progress Notes (Signed)
Cardiac Individual Treatment Plan  Patient Details  Name: Cameron Daniel MRN: [id number] Date of Birth: 1946/05/31 Referring Provider:     [facility] from 05/30/2019 in [place]  Referring Provider  Dr. 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  acetaminophen (TYLENOL) 500 MG tablet, Take 1,000 mg by mouth at bedtime as needed for mild pain., Disp: , Rfl:  .  aspirin EC 81 MG tablet, Take 1 tablet (81 mg total) by mouth daily., Disp: 90 tablet, Rfl: 3 .  carvedilol (COREG) 3.125 MG tablet, Take 1 tablet (3.125 mg total) by mouth 2 (two) times daily with a meal., Disp: 180 tablet, Rfl: 3 .  citalopram (CELEXA) 10 MG tablet, Take 10 mg by mouth daily., Disp: , Rfl:  .  clopidogrel (PLAVIX) 75 MG tablet, Take 1 tablet (75 mg total) by mouth daily., Disp: 90 tablet, Rfl: 3 .  colestipol (COLESTID) 5 g packet, Take 5 g by mouth daily. , Disp: , Rfl:  .  docusate sodium (COLACE) 100 MG capsule, Take 200 mg by mouth daily., Disp: , Rfl:  .  empagliflozin (JARDIANCE) 25 MG TABS tablet, Take 25 mg by mouth daily before breakfast., Disp: 90 tablet, Rfl: 3 .  fexofenadine (ALLEGRA) 180 MG tablet, Take 1 tablet (180 mg total) by mouth daily as needed. For allergies, Disp: 90 tablet, Rfl: 0 .  finasteride (PROSCAR) 5 MG tablet, Take 1 tablet (5 mg total) by mouth daily., Disp: 90 tablet, Rfl: 3 .  fluticasone (FLONASE) 50 MCG/ACT nasal spray, SPRAY 2 SPRAYS INTO EACH NOSTRIL EVERY DAY, Disp: 48 mL, Rfl: 2 .  glipiZIDE (GLUCOTROL) 5 MG tablet, TAKE 1 TABLET (5 MG TOTAL) BY MOUTH DAILY BEFORE BREAKFAST., Disp: 90 tablet, Rfl: 1 .  nystatin  cream (MYCOSTATIN), Apply 1 application topically 3 (three) times daily., Disp: 60 g, Rfl: 0 .  olmesartan (BENICAR) 20 MG tablet, Take 1 tablet (20 mg total) by mouth daily., Disp: 30 tablet, Rfl: 3 .  pantoprazole (PROTONIX) 40 MG tablet, TAKE 1 TABLET (40 MG TOTAL) BY MOUTH EVERY MORNING., Disp: 90 tablet, Rfl: 3 .  pramoxine (PROCTOFOAM) 1 % foam, Place 1 application rectally 3 (three) times daily as needed for anal itching., Disp: 15 g, Rfl: 3 .  rosuvastatin (CRESTOR) 40 MG tablet, Take 1 tablet (40 mg total) by mouth daily at 6 PM., Disp: 90 tablet, Rfl: 3  Past Medical History: Past Medical History:  Diagnosis Date  . Allergic rhinitis, cause unspecified   . Allergy   . Anxiety   . Arthritis   . Cancer ([place])   . Cataract   . Chronic systolic CHF (congestive heart failure) ([place])   . Coronary atherosclerosis of unspecified type of vessel, native or graft    a. MI 1985 with unclear details. b. CABG 03/2019 with LIMA -> LAD, SVG -> OM, SVG to dRCA.  . Depression    PTSD   . Diverticulosis of colon (without mention of hemorrhage)   . Esophageal reflux   . Esophageal stricture   . Essential hypertension   . Former tobacco use   . Hemorrhoids   . Hyperlipidemia   .  Irritable bowel syndrome   . Ischemic cardiomyopathy   . Myocardial infarction ([place])   . Nocturia   . Obesity, unspecified   . Orthostasis   . Other acquired absence of organ   . Personal history of colonic polyps   . Psychosexual dysfunction with inhibited sexual excitement   . Retroperitoneal bleed   . Type II or unspecified type diabetes mellitus without mention of complication, not stated as uncontrolled     Tobacco Use: Social History   Tobacco Use  Smoking Status Former Smoker  . Quit date: 08/30/1983  . Years since quitting: 35.8  Smokeless Tobacco Former Systems developer  Tobacco Comment   Quit 30 years ago.    Labs: Recent Review Flowsheet Data    Labs for ITP Cardiac and Pulmonary Rehab Latest Ref Rng &  Units 04/26/2019 04/27/2019 04/28/2019 04/29/2019 04/30/2019   Cholestrol <200 mg/dL - - - - -   LDLCALC mg/dL (calc) - - - - -   LDLDIRECT mg/dL - - - - -   HDL > OR = 40 mg/dL - - - - -   Trlycerides <150 mg/dL - - - - -   Hemoglobin A1c 4.8 - 5.6 % - - - - -   PHART 7.350 - 7.450 7.379 - - - -   PCO2ART 32.0 - 48.0 mmHg 43.7 - - - -   HCO3 20.0 - 28.0 mmol/L 25.7 - - - -   TCO2 22 - 32 mmol/L 27 25 - - -   ACIDBASEDEF 0.0 - 2.0 mmol/L - - - - -   O2SAT % 97.0 53.9 55.8 67.4 62.7      Capillary Blood Glucose: Lab Results  Component Value Date   GLUCAP 128 (H) 06/05/2019   GLUCAP 137 (H) 06/05/2019   GLUCAP 137 (H) 06/03/2019   GLUCAP 103 (H) 06/03/2019   GLUCAP 144 (H) 05/30/2019     Exercise Target Goals: Exercise Program Goal: Individual exercise prescription set using results from initial 6 min walk test and THRR while considering  patient's activity barriers and safety.   Exercise Prescription Goal: Starting with aerobic activity 30 plus minutes a day, 3 days per week for initial exercise prescription. Provide home exercise prescription and guidelines that participant acknowledges understanding prior to discharge.  Activity Barriers & Risk Stratification: Activity Barriers & Cardiac Risk Stratification - 05/30/19 1534      Activity Barriers & Cardiac Risk Stratification   Activity Barriers  Back Problems;Deconditioning;Muscular Weakness;Balance Concerns   Right hip and knee pain   Cardiac Risk Stratification  High       6 Minute Walk: 6 Minute Walk    Row Name 05/30/19 1533         6 Minute Walk   Phase  Initial     Distance  1000 feet     Walk Time  6 minutes     # of Rest Breaks  0     MPH  1.8     METS  1.9     RPE  11     Perceived Dyspnea   0     VO2 Peak  6.9     Symptoms  No     Resting HR  86 bpm     Resting BP  116/70     Resting Oxygen Saturation   100 %     Exercise Oxygen Saturation  during 6 min walk  95 %     Max Ex. HR  105 bpm  Max  Ex. BP  122/68     2 Minute Post BP  110/62        Oxygen Initial Assessment:   Oxygen Re-Evaluation:   Oxygen Discharge (Final Oxygen Re-Evaluation):   Initial Exercise Prescription: Initial Exercise Prescription - 05/30/19 1500      Date of Initial Exercise RX and Referring Provider   Date  05/30/19    Referring Provider  Dr. Felicity Pellegrini    Expected Discharge Date  07/26/19      NuStep   Level  2    SPM  75    Minutes  15    METs  1.8      Arm Ergometer   Level  1.5    Watts  15    Minutes  15    METs  1.79      Prescription Details   Frequency (times per week)  2    Duration  Progress to 30 minutes of continuous aerobic without signs/symptoms of physical distress      Intensity   THRR 40-80% of Max Heartrate  59-118    Ratings of Perceived Exertion  11-13      Progression   Progression  Continue to progress workloads to maintain intensity without signs/symptoms of physical distress.      Resistance Training   Training Prescription  Yes    Weight  3 lbs.     Reps  10-15       Perform Capillary Blood Glucose checks as needed.  Exercise Prescription Changes:  Exercise Prescription Changes    Row Name 06/03/19 1508 06/10/19 1503           Response to Exercise   Blood Pressure (Admit)  116/58  118/72      Blood Pressure (Exercise)  122/70  118/78      Blood Pressure (Exit)  108/70  104/70      Heart Rate (Admit)  81 bpm  86 bpm      Heart Rate (Exercise)  101 bpm  104 bpm      Heart Rate (Exit)  80 bpm  83 bpm      Rating of Perceived Exertion (Exercise)  11  13      Symptoms  none  none      Comments  Off to a good start with exercise.  -      Duration  Progress to 30 minutes of  aerobic without signs/symptoms of physical distress  Progress to 30 minutes of  aerobic without signs/symptoms of physical distress      Intensity  THRR unchanged  THRR unchanged        Progression   Progression  Continue to progress workloads to maintain  intensity without signs/symptoms of physical distress.  Continue to progress workloads to maintain intensity without signs/symptoms of physical distress.      Average METs  1.8  2.1        Resistance Training   Training Prescription  Yes  Yes      Weight  3 lbs.   3 lbs.       Reps  10-15  10-15      Time  10 Minutes  10 Minutes        Interval Training   Interval Training  No  No        NuStep   Level  2  2      SPM  75  75      Minutes  15  15      METs  1.8  2.1        Arm Ergometer   Level  1.5  2      Minutes  15  15      METs  1.7  2         Exercise Comments:  Exercise Comments    Row Name 06/03/19 1610 06/10/19 1520         Exercise Comments  Patient tolerated 1st session of exercise fairly well without symptoms. Some orthopedic limitations because of his knees.  Reviewed METs and goals with patient.         Exercise Goals and Review:  Exercise Goals    Row Name 05/30/19 1538             Exercise Goals   Increase Physical Activity  Yes       Intervention  Provide advice, education, support and counseling about physical activity/exercise needs.;Develop an individualized exercise prescription for aerobic and resistive training based on initial evaluation findings, risk stratification, comorbidities and participant's personal goals.       Expected Outcomes  Short Term: Attend rehab on a regular basis to increase amount of physical activity.;Long Term: Add in home exercise to make exercise part of routine and to increase amount of physical activity.;Long Term: Exercising regularly at least 3-5 days a week.       Increase Strength and Stamina  Yes       Intervention  Provide advice, education, support and counseling about physical activity/exercise needs.;Develop an individualized exercise prescription for aerobic and resistive training based on initial evaluation findings, risk stratification, comorbidities and participant's personal goals.       Expected  Outcomes  Short Term: Increase workloads from initial exercise prescription for resistance, speed, and METs.;Short Term: Perform resistance training exercises routinely during rehab and add in resistance training at home;Long Term: Improve cardiorespiratory fitness, muscular endurance and strength as measured by increased METs and functional capacity (6MWT)       Able to understand and use rate of perceived exertion (RPE) scale  Yes       Intervention  Provide education and explanation on how to use RPE scale       Expected Outcomes  Short Term: Able to use RPE daily in rehab to express subjective intensity level;Long Term:  Able to use RPE to guide intensity level when exercising independently       Knowledge and understanding of Target Heart Rate Range (THRR)  Yes       Intervention  Provide education and explanation of THRR including how the numbers were predicted and where they are located for reference       Expected Outcomes  Short Term: Able to state/look up THRR;Long Term: Able to use THRR to govern intensity when exercising independently;Short Term: Able to use daily as guideline for intensity in rehab       Able to check pulse independently  Yes       Intervention  Provide education and demonstration on how to check pulse in carotid and radial arteries.;Review the importance of being able to check your own pulse for safety during independent exercise       Expected Outcomes  Short Term: Able to explain why pulse checking is important during independent exercise;Long Term: Able to check pulse independently and accurately       Understanding of Exercise Prescription  Yes       Intervention  Provide education, explanation,  and written materials on patient's individual exercise prescription       Expected Outcomes  Short Term: Able to explain program exercise prescription;Long Term: Able to explain home exercise prescription to exercise independently          Exercise Goals Re-Evaluation  : Exercise Goals Re-Evaluation    Row Name 06/03/19 1610 06/10/19 1520           Exercise Goal Re-Evaluation   Exercise Goals Review  Increase Physical Activity;Able to understand and use rate of perceived exertion (RPE) scale  Increase Physical Activity;Able to understand and use rate of perceived exertion (RPE) scale      Comments  Patient able to understand and use RPE scale appropriately.  Patient is walking 10-15 minutes daily as his mode of home exercise.      Expected Outcomes  Increase workloads as tolerated to help improve cardiorespiratory fitness.  Patient will continue daily walking in addition to exercise at cardiac rehab to help achieve personal health and fitness goals.          Discharge Exercise Prescription (Final Exercise Prescription Changes): Exercise Prescription Changes - 06/10/19 1503      Response to Exercise   Blood Pressure (Admit)  118/72    Blood Pressure (Exercise)  118/78    Blood Pressure (Exit)  104/70    Heart Rate (Admit)  86 bpm    Heart Rate (Exercise)  104 bpm    Heart Rate (Exit)  83 bpm    Rating of Perceived Exertion (Exercise)  13    Symptoms  none    Duration  Progress to 30 minutes of  aerobic without signs/symptoms of physical distress    Intensity  THRR unchanged      Progression   Progression  Continue to progress workloads to maintain intensity without signs/symptoms of physical distress.    Average METs  2.1      Resistance Training   Training Prescription  Yes    Weight  3 lbs.     Reps  10-15    Time  10 Minutes      Interval Training   Interval Training  No      NuStep   Level  2    SPM  75    Minutes  15    METs  2.1      Arm Ergometer   Level  2    Minutes  15    METs  2       Nutrition:  Target Goals: Understanding of nutrition guidelines, daily intake of sodium 1500mg , cholesterol 200mg , calories 30% from fat and 7% or less from saturated fats, daily to have 5 or more servings of fruits and  vegetables.  Biometrics: Pre Biometrics - 05/30/19 1539      Pre Biometrics   Height  5' 9.5" (1.765 m)    Weight  100.7 kg    Waist Circumference  36.5 inches    Hip Circumference  44 inches    Waist to Hip Ratio  0.83 %    BMI (Calculated)  32.33    Triceps Skinfold  26 mm    % Body Fat  29.8 %    Grip Strength  28 kg    Flexibility  0 in    Single Leg Stand  3.25 seconds        Nutrition Therapy Plan and Nutrition Goals:   Nutrition Assessments:   Nutrition Goals Re-Evaluation:   Nutrition Goals Discharge (Final Nutrition Goals  Re-Evaluation):   Psychosocial: Target Goals: Acknowledge presence or absence of significant depression and/or stress, maximize coping skills, provide positive support system. Participant is able to verbalize types and ability to use techniques and skills needed for reducing stress and depression.  Initial Review & Psychosocial Screening: Initial Psych Review & Screening - 05/30/19 1523      Initial Review   Current issues with  Current Anxiety/Panic   Pt has a history of PTSD r/t his time served in the [employer] in [place].     Family Dynamics   Good Support System?  Yes   Pt states his wife as a source of support.     Barriers   Psychosocial barriers to participate in program  The patient should benefit from training in stress management and relaxation.      Screening Interventions   Interventions  Encouraged to exercise       Quality of Life Scores: Quality of Life - 05/30/19 1509      Quality of Life   Select  Quality of Life      Quality of Life Scores   Health/Function Pre  19.33 %    Socioeconomic Pre  22.5 %    Psych/Spiritual Pre  24.5 %    Family Pre  21.5 %    GLOBAL Pre  21.4 %      Scores of 19 and below usually indicate a poorer quality of life in these areas.  A difference of  2-3 points is a clinically meaningful difference.  A difference of 2-3 points in the total score of the Quality of Life Index has  been associated with significant improvement in overall quality of life, self-image, physical symptoms, and general health in studies assessing change in quality of life.  PHQ-9: Recent Review Flowsheet Data    Depression screen [facility] 2/9 05/30/2019 02/15/2019 09/18/2017 12/05/2016 12/16/2014   Decreased Interest 0 0 0 0 0   Down, Depressed, Hopeless 0 0 0 0 0   PHQ - 2 Score 0 0 0 0 0   Altered sleeping - - - 0 -   Tired, decreased energy - - - 0 -   Change in appetite - - - 0 -   Feeling bad or failure about yourself  - - - 0 -   Trouble concentrating - - - 0 -   Moving slowly or fidgety/restless - - - 0 -   Suicidal thoughts - - - 0 -   PHQ-9 Score - - - 0 -   Difficult doing work/chores - - - Not difficult at all -     Interpretation of Total Score  Total Score Depression Severity:  1-4 = Minimal depression, 5-9 = Mild depression, 10-14 = Moderate depression, 15-19 = Moderately severe depression, 20-27 = Severe depression   Psychosocial Evaluation and Intervention:   Psychosocial Re-Evaluation: Psychosocial Re-Evaluation    Row Name 06/13/19 1426             Psychosocial Re-Evaluation   Current issues with  Current Anxiety/Panic       Comments  [name] has not voiced any increase in stress or anxiety since he has been participating in phase 2 cardiac       Expected Outcomes  Patient will have decreased stress upon completion of phase 2 cardiac rehab       Interventions  Encouraged to attend Cardiac Rehabilitation for the exercise;Stress management education       Continue Psychosocial Services   No Follow  up required          Psychosocial Discharge (Final Psychosocial Re-Evaluation): Psychosocial Re-Evaluation - 06/13/19 1426      Psychosocial Re-Evaluation   Current issues with  Current Anxiety/Panic    Comments  [name] has not voiced any increase in stress or anxiety since he has been participating in phase 2 cardiac    Expected Outcomes  Patient will have decreased stress  upon completion of phase 2 cardiac rehab    Interventions  Encouraged to attend Cardiac Rehabilitation for the exercise;Stress management education    Continue Psychosocial Services   No Follow up required       Vocational Rehabilitation: Provide vocational rehab assistance to qualifying candidates.   Vocational Rehab Evaluation & Intervention: Vocational Rehab - 05/30/19 1524      Initial Vocational Rehab Evaluation & Intervention   Assessment shows need for Vocational Rehabilitation  No       Education: Education Goals: Education classes will be provided on a weekly basis, covering required topics. Participant will state understanding/return demonstration of topics presented.  Learning Barriers/Preferences: Learning Barriers/Preferences - 05/30/19 1539      Learning Barriers/Preferences   Learning Barriers  Sight;Hearing    Learning Preferences  Video;Pictoral       Education Topics: Hypertension, Hypertension Reduction -Define heart disease and high blood pressure. Discus how high blood pressure affects the body and ways to reduce high blood pressure.   Exercise and Your Heart -Discuss why it is important to exercise, the FITT principles of exercise, normal and abnormal responses to exercise, and how to exercise safely.   Angina -Discuss definition of angina, causes of angina, treatment of angina, and how to decrease risk of having angina.   Cardiac Medications -Review what the following cardiac medications are used for, how they affect the body, and side effects that may occur when taking the medications.  Medications include Aspirin, Beta blockers, calcium channel blockers, ACE Inhibitors, angiotensin receptor blockers, diuretics, digoxin, and antihyperlipidemics.   Congestive Heart Failure -Discuss the definition of CHF, how to live with CHF, the signs and symptoms of CHF, and how keep track of weight and sodium intake.   Heart Disease and Intimacy -Discus the  effect sexual activity has on the heart, how changes occur during intimacy as we age, and safety during sexual activity.   Smoking Cessation / COPD -Discuss different methods to quit smoking, the health benefits of quitting smoking, and the definition of COPD.   Nutrition I: Fats -Discuss the types of cholesterol, what cholesterol does to the heart, and how cholesterol levels can be controlled.   Nutrition II: Labels -Discuss the different components of food labels and how to read food label   Heart Parts/Heart Disease and PAD -Discuss the anatomy of the heart, the pathway of blood circulation through the heart, and these are affected by heart disease.   Stress I: Signs and Symptoms -Discuss the causes of stress, how stress may lead to anxiety and depression, and ways to limit stress.   Stress II: Relaxation -Discuss different types of relaxation techniques to limit stress.   Warning Signs of Stroke / TIA -Discuss definition of a stroke, what the signs and symptoms are of a stroke, and how to identify when someone is having stroke.   Knowledge Questionnaire Score: Knowledge Questionnaire Score - 05/30/19 1522      Knowledge Questionnaire Score   Pre Score  21/24       Core Components/Risk Factors/Patient Goals at Admission: Personal Goals  and Risk Factors at Admission - 05/30/19 1620      Core Components/Risk Factors/Patient Goals on Admission    Weight Management  Yes;Weight Maintenance;Weight Loss    Intervention  Weight Management: Develop a combined nutrition and exercise program designed to reach desired caloric intake, while maintaining appropriate intake of nutrient and fiber, sodium and fats, and appropriate energy expenditure required for the weight goal.;Weight Management: Provide education and appropriate resources to help participant work on and attain dietary goals.;Weight Management/Obesity: Establish reasonable short term and long term weight goals.     Admit Weight  222 lb 0.1 oz (100.7 kg)    Expected Outcomes  Short Term: Continue to assess and modify interventions until short term weight is achieved;Long Term: Adherence to nutrition and physical activity/exercise program aimed toward attainment of established weight goal;Weight Loss: Understanding of general recommendations for a balanced deficit meal plan, which promotes 1-2 lb weight loss per week and includes a negative energy balance of [phone] kcal/d;Understanding recommendations for meals to include 15-35% energy as protein, 25-35% energy from fat, 35-60% energy from carbohydrates, less than 200mg  of dietary cholesterol, 20-35 gm of total fiber daily;Weight Maintenance: Understanding of the daily nutrition guidelines, which includes 25-35% calories from fat, 7% or less cal from saturated fats, less than 200mg  cholesterol, less than 1.5gm of sodium, & 5 or more servings of fruits and vegetables daily;Understanding of distribution of calorie intake throughout the day with the consumption of 4-5 meals/snacks    Diabetes  Yes    Intervention  Provide education about signs/symptoms and action to take for hypo/hyperglycemia.;Provide education about proper nutrition, including hydration, and aerobic/resistive exercise prescription along with prescribed medications to achieve blood glucose in normal ranges: Fasting glucose 65-99 mg/dL    Expected Outcomes  Short Term: Participant verbalizes understanding of the signs/symptoms and immediate care of hyper/hypoglycemia, proper foot care and importance of medication, aerobic/resistive exercise and nutrition plan for blood glucose control.;Long Term: Attainment of HbA1C < 7%.    Heart Failure  Yes    Expected Outcomes  Short term: Attendance in program 2-3 days a week with increased exercise capacity. Reported lower sodium intake. Reported increased fruit and vegetable intake. Reports medication compliance.;Short term: Daily weights obtained and reported for  increase. Utilizing diuretic protocols set by physician.;Long term: Adoption of self-care skills and reduction of barriers for early signs and symptoms recognition and intervention leading to self-care maintenance.    Hypertension  Yes    Intervention  Provide education on lifestyle modifcations including regular physical activity/exercise, weight management, moderate sodium restriction and increased consumption of fresh fruit, vegetables, and low fat dairy, alcohol moderation, and smoking cessation.;Monitor prescription use compliance.    Expected Outcomes  Short Term: Continued assessment and intervention until BP is < 140/44mm HG in hypertensive participants. < 130/24mm HG in hypertensive participants with diabetes, heart failure or chronic kidney disease.;Long Term: Maintenance of blood pressure at goal levels.    Lipids  Yes    Intervention  Provide education and support for participant on nutrition & aerobic/resistive exercise along with prescribed medications to achieve LDL 70mg , HDL >40mg .    Expected Outcomes  Short Term: Participant states understanding of desired cholesterol values and is compliant with medications prescribed. Participant is following exercise prescription and nutrition guidelines.;Long Term: Cholesterol controlled with medications as prescribed, with individualized exercise RX and with personalized nutrition plan. Value goals: LDL < 70mg , HDL > 40 mg.    Stress  Yes    Intervention  Offer individual and/or  small group education and counseling on adjustment to heart disease, stress management and health-related lifestyle change. Teach and support self-help strategies.;Refer participants experiencing significant psychosocial distress to appropriate mental health specialists for further evaluation and treatment. When possible, include family members and significant others in education/counseling sessions.    Expected Outcomes  Short Term: Participant demonstrates changes in  health-related behavior, relaxation and other stress management skills, ability to obtain effective social support, and compliance with psychotropic medications if prescribed.;Long Term: Emotional wellbeing is indicated by absence of clinically significant psychosocial distress or social isolation.       Core Components/Risk Factors/Patient Goals Review:  Goals and Risk Factor Review    Row Name 06/03/19 1656 06/13/19 1428           Core Components/Risk Factors/Patient Goals Review   Personal Goals Review  Weight Management/Obesity;Diabetes;Heart Failure;Hypertension;Lipids;Stress  Weight Management/Obesity;Diabetes;Heart Failure;Hypertension;Lipids;Stress      Review  [name] completed frist day of exercise on 06/03/19  [name] has been doing well with exercise. Ken's vital signs and CBG's have been stable.      Expected Outcomes  Patient will continue to partcipate in cardiac rehab for exercise diet and lifestyle modifications.  Patient will continue to partcipate in cardiac rehab for exercise diet and lifestyle modifications.         Core Components/Risk Factors/Patient Goals at Discharge (Final Review):  Goals and Risk Factor Review - 06/13/19 1428      Core Components/Risk Factors/Patient Goals Review   Personal Goals Review  Weight Management/Obesity;Diabetes;Heart Failure;Hypertension;Lipids;Stress    Review  [name] has been doing well with exercise. Ken's vital signs and CBG's have been stable.    Expected Outcomes  Patient will continue to partcipate in cardiac rehab for exercise diet and lifestyle modifications.       ITP Comments: ITP Comments    Row Name 05/30/19 1552 06/03/19 1658 06/13/19 1436       ITP Comments  Dr. Fransico Him, Medical Director  30 Day ITP Review. [name] started exercise on 06/03/19.  30 Day ITP Review. [name] has good attendance and participation in phase 2 cardiac rehab.        Comments: See ITP comments.Barnet Pall, RN,BSN 06/13/2019 2:39 PM

## 2019-06-14 ENCOUNTER — Encounter (HOSPITAL_COMMUNITY): Payer: Medicare HMO

## 2019-06-17 ENCOUNTER — Encounter (HOSPITAL_COMMUNITY)
Admission: RE | Admit: 2019-06-17 | Discharge: 2019-06-17 | Disposition: A | Discharge status: Home / Self Care | Payer: Medicare HMO | Source: Ambulatory Visit | Attending: Internal Medicine | Admitting: Internal Medicine

## 2019-06-17 ENCOUNTER — Other Ambulatory Visit: Payer: Self-pay

## 2019-06-17 DIAGNOSIS — Z951 Presence of aortocoronary bypass graft: Secondary | ICD-10-CM | POA: Diagnosis not present

## 2019-06-17 DIAGNOSIS — I214 Non-ST elevation (NSTEMI) myocardial infarction: Secondary | ICD-10-CM

## 2019-06-19 ENCOUNTER — Other Ambulatory Visit: Payer: Self-pay

## 2019-06-19 ENCOUNTER — Encounter (HOSPITAL_COMMUNITY)
Admission: RE | Admit: 2019-06-19 | Discharge: 2019-06-19 | Disposition: A | Discharge status: Home / Self Care | Payer: Medicare HMO | Source: Ambulatory Visit | Attending: Internal Medicine | Admitting: Internal Medicine

## 2019-06-19 DIAGNOSIS — I214 Non-ST elevation (NSTEMI) myocardial infarction: Secondary | ICD-10-CM

## 2019-06-19 DIAGNOSIS — Z951 Presence of aortocoronary bypass graft: Secondary | ICD-10-CM

## 2019-06-19 NOTE — Progress Notes (Signed)
   06/19/19 1545  Exercise Goal Re-Evaluation  Exercise Goals Review Increase Physical Activity;Able to understand and use rate of perceived exertion (RPE) scale;Knowledge and understanding of Target Heart Rate Range (THRR);Understanding of Exercise Prescription;Able to check pulse independently;Increase Strength and Stamina  Comments Reviewed home exercise guidelines with patient including THRR, RPE scale, and endpoints for exercise. Patient is walking 10-15 minutes daily in addition to exercise at cardiac rehab. Pt has chronic right knee & right hip pain, which limits the amount of walking he can do. Pt just purchased a stationary bike and a treadmill, which he plans to use as part of his home exercise routine. Patient states that the exercise is helping him, and he can tell a difference in how he feels since starting his exercise program. Pt has a blood pressure cuff at home that measures heart rate as well. Pulse counting handout given.  Expected Outcomes Increase workloads as tolerated to help increase his strength and stamina.

## 2019-06-21 ENCOUNTER — Encounter (HOSPITAL_COMMUNITY): Payer: Medicare HMO

## 2019-06-24 ENCOUNTER — Telehealth: Payer: Self-pay | Admitting: Family Medicine

## 2019-06-24 ENCOUNTER — Other Ambulatory Visit: Payer: Self-pay

## 2019-06-24 ENCOUNTER — Encounter (HOSPITAL_COMMUNITY)
Admission: RE | Admit: 2019-06-24 | Discharge: 2019-06-24 | Disposition: A | Discharge status: Home / Self Care | Payer: Medicare HMO | Source: Ambulatory Visit | Attending: Internal Medicine | Admitting: Internal Medicine

## 2019-06-24 ENCOUNTER — Telehealth (HOSPITAL_COMMUNITY): Payer: Self-pay | Admitting: *Deleted

## 2019-06-24 VITALS — BP 104/62 | HR 86 | Temp 97.3°F | Wt 219.6 lb

## 2019-06-24 DIAGNOSIS — I214 Non-ST elevation (NSTEMI) myocardial infarction: Secondary | ICD-10-CM

## 2019-06-24 DIAGNOSIS — Z951 Presence of aortocoronary bypass graft: Secondary | ICD-10-CM | POA: Diagnosis not present

## 2019-06-24 DIAGNOSIS — Z96651 Presence of right artificial knee joint: Secondary | ICD-10-CM

## 2019-06-24 DIAGNOSIS — G8929 Other chronic pain: Secondary | ICD-10-CM

## 2019-06-24 NOTE — Telephone Encounter (Signed)
Patient would like a referral sent to Dr. Anne Fu office for another injection. He states that he is overdue for one since his heart surgery.  CB# [phone]

## 2019-06-24 NOTE — Telephone Encounter (Signed)
Pt wants to know if he can continue to receive injections for knee pain. Pt was not sure the name of medication being injected. Pt states he has really bad pain and wants to make sure he can still take them despite his heart condition.   Routed to [facility]

## 2019-06-25 ENCOUNTER — Telehealth (HOSPITAL_COMMUNITY): Payer: Self-pay | Admitting: Family Medicine

## 2019-06-25 NOTE — Telephone Encounter (Signed)
Yes. That is ok 

## 2019-06-25 NOTE — Telephone Encounter (Signed)
Pt's wife aware of  appt with Richardson Landry - PA for Dr. Maureen Ralphs at 2:15 on 06/26/19

## 2019-06-25 NOTE — Telephone Encounter (Signed)
Spoke with patient he is aware

## 2019-06-25 NOTE — Telephone Encounter (Signed)
Placed urgent referral to Dr. Maureen Ralphs or his PA's - Larene Beach to call and let me know outcome.

## 2019-06-25 NOTE — Telephone Encounter (Signed)
Pt called stating that he needs another cortisone injection in his right knee asap. Pt states that he cannot do cardiac rehab because of his knee and he would like to get an injection to calm the pain so he can go to his appt tomorrow for cardiac rehab. I told the pt that there was no appts available and that he can be seen on Thursday. Pt refused the offer and states he wants it done today. Please advise.

## 2019-06-26 ENCOUNTER — Encounter (HOSPITAL_COMMUNITY): Payer: Medicare HMO

## 2019-06-26 DIAGNOSIS — M25561 Pain in right knee: Secondary | ICD-10-CM | POA: Diagnosis not present

## 2019-06-26 DIAGNOSIS — M1711 Unilateral primary osteoarthritis, right knee: Secondary | ICD-10-CM | POA: Diagnosis not present

## 2019-06-26 DIAGNOSIS — M170 Bilateral primary osteoarthritis of knee: Secondary | ICD-10-CM | POA: Diagnosis not present

## 2019-06-26 DIAGNOSIS — M1712 Unilateral primary osteoarthritis, left knee: Secondary | ICD-10-CM | POA: Diagnosis not present

## 2019-06-28 ENCOUNTER — Encounter (HOSPITAL_COMMUNITY): Payer: Medicare HMO

## 2019-07-01 ENCOUNTER — Encounter (HOSPITAL_COMMUNITY): Payer: No Typology Code available for payment source

## 2019-07-01 ENCOUNTER — Telehealth (HOSPITAL_COMMUNITY): Payer: Self-pay | Admitting: Family Medicine

## 2019-07-02 ENCOUNTER — Encounter (HOSPITAL_COMMUNITY): Payer: Self-pay | Admitting: *Deleted

## 2019-07-02 DIAGNOSIS — I214 Non-ST elevation (NSTEMI) myocardial infarction: Secondary | ICD-10-CM

## 2019-07-02 DIAGNOSIS — Z951 Presence of aortocoronary bypass graft: Secondary | ICD-10-CM

## 2019-07-03 ENCOUNTER — Encounter (HOSPITAL_COMMUNITY): Payer: No Typology Code available for payment source

## 2019-07-03 NOTE — Progress Notes (Signed)
Discharge Progress Report  Patient Details  Name: Cameron Daniel MRN: [id number] Date of Birth: 04-21-1946 Referring Provider:     [facility] from 05/30/2019 in [place]  Referring Provider  Dr. Felicity Pellegrini       Number of Visits: 7  Reason for Discharge:  Early Exit:  Stopped exercise early due to problems with knee  Smoking History:  Social History   Tobacco Use  Smoking Status Former Smoker  . Quit date: 08/30/1983  . Years since quitting: 35.8  Smokeless Tobacco Former Systems developer  Tobacco Comment   Quit 30 years ago.    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 Felicity Pellegrini    Expected Discharge Date  07/26/19      NuStep   Level  2    SPM  75    Minutes  15    METs  1.8      Arm Ergometer   Level  1.5    Watts  15    Minutes  15    METs  1.79      Prescription Details   Frequency (times per week)  2    Duration  Progress to 30 minutes of continuous aerobic without signs/symptoms of physical distress      Intensity   THRR 40-80% of Max Heartrate  59-118    Ratings of Perceived Exertion  11-13      Progression   Progression  Continue to progress workloads to maintain intensity without signs/symptoms of physical distress.      Resistance Training   Training Prescription  Yes    Weight  3 lbs.     Reps  10-15       Discharge Exercise Prescription (Final Exercise Prescription Changes): Exercise Prescription Changes - 06/24/19 1505      Response to Exercise   Blood Pressure (Admit)  104/62    Blood Pressure (Exercise)  108/64    Blood Pressure (Exit)  100/60    Heart Rate (Admit)  86 bpm    Heart Rate (Exercise)  95 bpm    Heart Rate  (Exit)  82 bpm    Rating of Perceived Exertion (Exercise)  13    Symptoms  none    Duration  Progress to 30 minutes of  aerobic without signs/symptoms of physical distress    Intensity  THRR unchanged      Progression   Progression  Continue to progress workloads to maintain intensity without signs/symptoms of physical distress.    Average METs  1.8      Resistance Training   Training Prescription  Yes    Weight  3 lbs.     Reps  10-15    Time  10 Minutes      Interval Training   Interval Training  No      Arm Ergometer   Level  3    Watts  15    Minutes  15    METs  1.79      Home Exercise Plan   Plans to continue exercise at  Home (comment)   Walking, treadmill, stationary bike   Frequency  Add 4 additional days to program exercise sessions.  Initial Home Exercises Provided  06/19/19       Functional Capacity: 6 Minute Walk    Row Name 05/30/19 1533         6 Minute Walk   Phase  Initial     Distance  1000 feet     Walk Time  6 minutes     # of Rest Breaks  0     MPH  1.8     METS  1.9     RPE  11     Perceived Dyspnea   0     VO2 Peak  6.9     Symptoms  No     Resting HR  86 bpm     Resting BP  116/70     Resting Oxygen Saturation   100 %     Exercise Oxygen Saturation  during 6 min walk  95 %     Max Ex. HR  105 bpm     Max Ex. BP  122/68     2 Minute Post BP  110/62        Psychological, QOL, Others - Outcomes: PHQ 2/9: Depression screen [facility] 2/9 05/30/2019 02/15/2019 09/18/2017 12/05/2016 12/16/2014  Decreased Interest 0 0 0 0 0  Down, Depressed, Hopeless 0 0 0 0 0  PHQ - 2 Score 0 0 0 0 0  Altered sleeping - - - 0 -  Tired, decreased energy - - - 0 -  Change in appetite - - - 0 -  Feeling bad or failure about yourself  - - - 0 -  Trouble concentrating - - - 0 -  Moving slowly or fidgety/restless - - - 0 -  Suicidal thoughts - - - 0 -  PHQ-9 Score - - - 0 -  Difficult doing work/chores - - - Not difficult at all -  Some recent data might be  hidden    Quality of Life: Quality of Life - 05/30/19 1509      Quality of Life   Select  Quality of Life      Quality of Life Scores   Health/Function Pre  19.33 %    Socioeconomic Pre  22.5 %    Psych/Spiritual Pre  24.5 %    Family Pre  21.5 %    GLOBAL Pre  21.4 %       Personal Goals: Goals established at orientation with interventions provided to work toward goal. Personal Goals and Risk Factors at Admission - 05/30/19 1620      Core Components/Risk Factors/Patient Goals on Admission    Weight Management  Yes;Weight Maintenance;Weight Loss    Intervention  Weight Management: Develop a combined nutrition and exercise program designed to reach desired caloric intake, while maintaining appropriate intake of nutrient and fiber, sodium and fats, and appropriate energy expenditure required for the weight goal.;Weight Management: Provide education and appropriate resources to help participant work on and attain dietary goals.;Weight Management/Obesity: Establish reasonable short term and long term weight goals.    Admit Weight  222 lb 0.1 oz (100.7 kg)    Expected Outcomes  Short Term: Continue to assess and modify interventions until short term weight is achieved;Long Term: Adherence to nutrition and physical activity/exercise program aimed toward attainment of established weight goal;Weight Loss: Understanding of general recommendations for a balanced deficit meal plan, which promotes 1-2 lb weight loss per week and includes a negative energy balance of [phone] kcal/d;Understanding recommendations for meals to include 15-35% energy as protein,  25-35% energy from fat, 35-60% energy from carbohydrates, less than 200mg  of dietary cholesterol, 20-35 gm of total fiber daily;Weight Maintenance: Understanding of the daily nutrition guidelines, which includes 25-35% calories from fat, 7% or less cal from saturated fats, less than 200mg  cholesterol, less than 1.5gm of sodium, & 5 or more servings  of fruits and vegetables daily;Understanding of distribution of calorie intake throughout the day with the consumption of 4-5 meals/snacks    Diabetes  Yes    Intervention  Provide education about signs/symptoms and action to take for hypo/hyperglycemia.;Provide education about proper nutrition, including hydration, and aerobic/resistive exercise prescription along with prescribed medications to achieve blood glucose in normal ranges: Fasting glucose 65-99 mg/dL    Expected Outcomes  Short Term: Participant verbalizes understanding of the signs/symptoms and immediate care of hyper/hypoglycemia, proper foot care and importance of medication, aerobic/resistive exercise and nutrition plan for blood glucose control.;Long Term: Attainment of HbA1C < 7%.    Heart Failure  Yes    Expected Outcomes  Short term: Attendance in program 2-3 days a week with increased exercise capacity. Reported lower sodium intake. Reported increased fruit and vegetable intake. Reports medication compliance.;Short term: Daily weights obtained and reported for increase. Utilizing diuretic protocols set by physician.;Long term: Adoption of self-care skills and reduction of barriers for early signs and symptoms recognition and intervention leading to self-care maintenance.    Hypertension  Yes    Intervention  Provide education on lifestyle modifcations including regular physical activity/exercise, weight management, moderate sodium restriction and increased consumption of fresh fruit, vegetables, and low fat dairy, alcohol moderation, and smoking cessation.;Monitor prescription use compliance.    Expected Outcomes  Short Term: Continued assessment and intervention until BP is < 140/90mm HG in hypertensive participants. < 130/63mm HG in hypertensive participants with diabetes, heart failure or chronic kidney disease.;Long Term: Maintenance of blood pressure at goal levels.    Lipids  Yes    Intervention  Provide education and support for  participant on nutrition & aerobic/resistive exercise along with prescribed medications to achieve LDL 70mg , HDL >40mg .    Expected Outcomes  Short Term: Participant states understanding of desired cholesterol values and is compliant with medications prescribed. Participant is following exercise prescription and nutrition guidelines.;Long Term: Cholesterol controlled with medications as prescribed, with individualized exercise RX and with personalized nutrition plan. Value goals: LDL < 70mg , HDL > 40 mg.    Stress  Yes    Intervention  Offer individual and/or small group education and counseling on adjustment to heart disease, stress management and health-related lifestyle change. Teach and support self-help strategies.;Refer participants experiencing significant psychosocial distress to appropriate mental health specialists for further evaluation and treatment. When possible, include family members and significant others in education/counseling sessions.    Expected Outcomes  Short Term: Participant demonstrates changes in health-related behavior, relaxation and other stress management skills, ability to obtain effective social support, and compliance with psychotropic medications if prescribed.;Long Term: Emotional wellbeing is indicated by absence of clinically significant psychosocial distress or social isolation.        Personal Goals Discharge: Goals and Risk Factor Review    Row Name 06/03/19 1656 06/13/19 1428           Core Components/Risk Factors/Patient Goals Review   Personal Goals Review  Weight Management/Obesity;Diabetes;Heart Failure;Hypertension;Lipids;Stress  Weight Management/Obesity;Diabetes;Heart Failure;Hypertension;Lipids;Stress      Review  Cameron Daniel completed frist day of exercise on 06/03/19  Cameron Daniel has been doing well with exercise. Cameron Daniel's vital signs and CBG's have been  stable.      Expected Outcomes  Patient will continue to partcipate in cardiac rehab for exercise diet and  lifestyle modifications.  Patient will continue to partcipate in cardiac rehab for exercise diet and lifestyle modifications.         Exercise Goals and Review: Exercise Goals    Row Name 05/30/19 1538             Exercise Goals   Increase Physical Activity  Yes       Intervention  Provide advice, education, support and counseling about physical activity/exercise needs.;Develop an individualized exercise prescription for aerobic and resistive training based on initial evaluation findings, risk stratification, comorbidities and participant's personal goals.       Expected Outcomes  Short Term: Attend rehab on a regular basis to increase amount of physical activity.;Long Term: Add in home exercise to make exercise part of routine and to increase amount of physical activity.;Long Term: Exercising regularly at least 3-5 days a week.       Increase Strength and Stamina  Yes       Intervention  Provide advice, education, support and counseling about physical activity/exercise needs.;Develop an individualized exercise prescription for aerobic and resistive training based on initial evaluation findings, risk stratification, comorbidities and participant's personal goals.       Expected Outcomes  Short Term: Increase workloads from initial exercise prescription for resistance, speed, and METs.;Short Term: Perform resistance training exercises routinely during rehab and add in resistance training at home;Long Term: Improve cardiorespiratory fitness, muscular endurance and strength as measured by increased METs and functional capacity (6MWT)       Able to understand and use rate of perceived exertion (RPE) scale  Yes       Intervention  Provide education and explanation on how to use RPE scale       Expected Outcomes  Short Term: Able to use RPE daily in rehab to express subjective intensity level;Long Term:  Able to use RPE to guide intensity level when exercising independently       Knowledge and  understanding of Target Heart Rate Range (THRR)  Yes       Intervention  Provide education and explanation of THRR including how the numbers were predicted and where they are located for reference       Expected Outcomes  Short Term: Able to state/look up THRR;Long Term: Able to use THRR to govern intensity when exercising independently;Short Term: Able to use daily as guideline for intensity in rehab       Able to check pulse independently  Yes       Intervention  Provide education and demonstration on how to check pulse in carotid and radial arteries.;Review the importance of being able to check your own pulse for safety during independent exercise       Expected Outcomes  Short Term: Able to explain why pulse checking is important during independent exercise;Long Term: Able to check pulse independently and accurately       Understanding of Exercise Prescription  Yes       Intervention  Provide education, explanation, and written materials on patient's individual exercise prescription       Expected Outcomes  Short Term: Able to explain program exercise prescription;Long Term: Able to explain home exercise prescription to exercise independently          Exercise Goals Re-Evaluation: Exercise Goals Re-Evaluation    Row Name 06/03/19 1610 06/10/19 1520 06/19/19 1545 07/01/19 1001  Exercise Goal Re-Evaluation   Exercise Goals Review  Increase Physical Activity;Able to understand and use rate of perceived exertion (RPE) scale  Increase Physical Activity;Able to understand and use rate of perceived exertion (RPE) scale  Increase Physical Activity;Able to understand and use rate of perceived exertion (RPE) scale;Knowledge and understanding of Target Heart Rate Range (THRR);Understanding of Exercise Prescription;Able to check pulse independently;Increase Strength and Stamina  -    Comments  Patient able to understand and use RPE scale appropriately.  Patient is walking 10-15 minutes daily as his  mode of home exercise.  Reviewed home exercise guidelines with patient including THRR, RPE scale, and endpoints for exercise. Patient is walking 10-15 minutes daily in addition to exercise at cardiac rehab. Pt has chronic right knee & right hip pain, which limits the amount of walking he can do. Pt just purchased a stationary bike and a treadmill, which he plans to use as part of his home exercise routine. Patient states that the exercise is helping him, and he can tell a difference in how he feels since starting his exercise program. Pt has a blood pressure cuff at home that measures heart rate as well. Pulse counting handout given.  Patient called to discharge from the cardiac rehab program secondary to ongoing knee pain.    Expected Outcomes  Increase workloads as tolerated to help improve cardiorespiratory fitness.  Patient will continue daily walking in addition to exercise at cardiac rehab to help achieve personal health and fitness goals.  Increase workloads as tolerated to help increase his strength and stamina.  -       Nutrition & Weight - Outcomes: Pre Biometrics - 05/30/19 1539      Pre Biometrics   Height  5' 9.5" (1.765 m)    Weight  222 lb 0.1 oz (100.7 kg)    Waist Circumference  36.5 inches    Hip Circumference  44 inches    Waist to Hip Ratio  0.83 %    BMI (Calculated)  32.33    Triceps Skinfold  26 mm    % Body Fat  29.8 %    Grip Strength  28 kg    Flexibility  0 in    Single Leg Stand  3.25 seconds      Post Biometrics - 06/24/19 1505       Post  Biometrics   Weight  219 lb 9.3 oz (99.6 kg)    BMI (Calculated)  30.64       Nutrition:   Nutrition Discharge:   Education Questionnaire Score: Knowledge Questionnaire Score - 05/30/19 1522      Knowledge Questionnaire Score   Pre Score  21/24       Cameron Daniel attended 7 exercise sessions between 06/03/19-06/24/19. Cameron Daniel did well with exercise and lost 1.7kg while in attendance. Cameron Daniel did complain about chronic hip and  knee pain and called to stop participation in the program on 07/01/19 due to continued problems with his knee.Barnet Pall, RN,BSN 07/03/2019 11:44 AM

## 2019-07-05 ENCOUNTER — Encounter (HOSPITAL_COMMUNITY): Payer: No Typology Code available for payment source

## 2019-07-08 ENCOUNTER — Encounter (HOSPITAL_COMMUNITY): Payer: No Typology Code available for payment source

## 2019-07-10 ENCOUNTER — Encounter (HOSPITAL_COMMUNITY): Payer: No Typology Code available for payment source

## 2019-07-12 ENCOUNTER — Ambulatory Visit (INDEPENDENT_AMBULATORY_CARE_PROVIDER_SITE_OTHER): Payer: Medicare HMO | Admitting: Family Medicine

## 2019-07-12 ENCOUNTER — Encounter (HOSPITAL_COMMUNITY): Payer: No Typology Code available for payment source

## 2019-07-12 ENCOUNTER — Encounter: Payer: Self-pay | Admitting: Family Medicine

## 2019-07-12 ENCOUNTER — Other Ambulatory Visit: Payer: Self-pay

## 2019-07-12 VITALS — BP 130/70 | HR 82 | Temp 98.1°F | Resp 16 | Ht 71.0 in | Wt 219.0 lb

## 2019-07-12 DIAGNOSIS — M79604 Pain in right leg: Secondary | ICD-10-CM | POA: Diagnosis not present

## 2019-07-12 NOTE — Progress Notes (Signed)
Subjective:    Patient ID: Cameron Daniel, male    DOB: 03/21/1946, 73 y.o.   MRN: [id number]  HPI  Unfortunately, patient was recently admitted to the hospital and was found to have severe three-vessel coronary artery disease as well as congestive heart failure.  I have copied relevant portions of the discharge summary below for my reference:  Admit date: 04/12/2019 Discharge date: 05/02/2019  Admission Diagnoses:      Patient Active Problem List   Diagnosis Date Noted   Hyponatremia 04/20/2019   Retroperitoneal hematoma 04/18/2019   Acute on chronic blood loss anemia [id number]   Acute systolic HF (heart failure) ([place]) 04/15/2019   Non-ST elevation (NSTEMI) myocardial infarction ([place]) 04/15/2019   AKI (acute kidney injury) ([place])    Community acquired pneumonia of right lower lobe of lung ([place]) 04/12/2019   Acute respiratory failure ([place]) 04/12/2019   Allergic conjunctivitis 01/23/2018   Chronic sinusitis 01/23/2018   Gout 04/06/2012   DIVERTICULITIS-COLON 06/18/2010   FACIAL FLUSHING 12/11/2007   NOCTURIA 12/11/2007   Type 2 diabetes mellitus with hyperlipidemia ([place]) 09/07/2007   OTHER MALAISE AND FATIGUE 09/07/2007   Allergic rhinitis 08/16/2007   HYPERLIPIDEMIA 01/24/2007   OBESITY 01/24/2007   ERECTILE DYSFUNCTION 01/24/2007   HYPERTENSION 01/24/2007   Coronary atherosclerosis 01/24/2007   GERD 01/24/2007   DIVERTICULOSIS, COLON 01/24/2007   IRRITABLE BOWEL SYNDROME 01/24/2007   COLONIC POLYPS, HX OF 01/24/2007   CHOLECYSTECTOMY, HX OF 01/24/2007    Discharge Diagnoses:  Principal Problem:   Non-ST elevation (NSTEMI) myocardial infarction [facility]) Active Problems:   Type 2 diabetes mellitus with hyperlipidemia (HCC)   Coronary atherosclerosis   GERD   Community acquired pneumonia of right lower lobe of lung (HCC)   Acute respiratory failure (HCC)   AKI (acute kidney injury) ([place])   Acute systolic HF (heart failure) (HCC)  Retroperitoneal hematoma   Acute on chronic blood loss anemia   Hyponatremia   S/P CABG x 3   Discharged Condition: good  HPI:  The patient is a 73 year old gentleman with history of hypertension, diabetes, hyperlipidemia, coronary artery disease status post angioplasty in 1985 who was seen in the emergency room on 04/12/2019 for possible dehydration after working outside in the heat. His wife said they spent about 7 hours in the ER was given intravenous fluids and sent home. He returned with chills, fatigue, cough, and dyspnea and a CT scan of the chest showed right lower lobe pneumonia. High-sensitivity troponin was mildly elevated at 136 and subsequent values were 150 and 1,963. He developed progressive respiratory failure with hypoxia and tachypnea and required intubation in the emergency room. He was seen by PCCM.He was started on antibiotics. A post intubation chest x-ray showed central pulmonary edema. He was extubated the following day and did well initially and then developed acute respiratory distress requiring increased oxygen and diuresis. He had a stat echocardiogram which showed ejection fraction of 25 to 30% although it was poor quality. There is no significant valvular dysfunction. He underwent cardiac catheterization today showing severe three-vessel coronary disease. Results of his right heart catheterization arenot in the cath note although says that there was mild pulmonary hypertension and normal LVEDP.  The patient's wife is with him today. He denies any history of chest pain or pressure. He said that he stays active working in his yard. He has occasional episodes of exertional shortness of breath but stops to rest and it goes away and he resumes his activity. He feels like his energy  level has been good.   Hospital Course:   On 05/27/2019 Cameron Daniel underwent a CABG x 3 with Dr. Cyndia Bent.  He was transferred to the surgical ICU for continued care.  He was  extubated in a timely manner.  Heart failure was consulted to assist with milrinone titration.  Intra-Op estimated ejection fraction was 45 to 50%.  Postop day 1 we were able to wean him off of his epinephrine.  We continued milrinone for now.  We gave some albumin and Lasix to stimulate diuresis.  We transitioned his insulin drip to Levemir and sliding scale insulin.  We discontinued his chest tubes.  We initiated Lovenox therapy for DVT prophylaxis.  Postop day 2 his co-ox was 56% therefore we able to decrease milrinone to 0.125.  He was given a dose of IV Lasix.  We continue to encourage use of his incentive spirometer.  Postop day 3 his only complaint was regarding his pain in his buttocks from sitting in the chair.  We continue diuresis due to fluid overload.  His co-ox increased to 67%, therefore we discontinued milrinone at this time.  We will continue to encourage ambulation and incentive spirometer use.  His Levemir dose was decreased due to hypoglycemia.  Postop day 4 he was off all of his drips.  He felt like he was recovering well.  His kidney function remained stable.  His Lasix was discontinued but he remained on spironolactone.  He was stable at this time to transfer to the stepdown unit for continued care.  On the stepdown unit, his two-view chest x-ray showed Decreased left basilar and right upper lobe atelectasis and interval minimal right basilar atelectasis.  Today, he is ambulating with limited assistance, tolerating room air, his incisions are healing well, and he is ready for discharge.  05/07/19 Patient is here today for follow-up.  He feels extremely weak and tired after his open heart surgery which is to be expected.  The sternal incision appears to be healing well with no evidence of secondary cellulitis.  He does have a large hematoma on his right flank and just above his right gluteus.  However this appears to be fading.  He is tender to palpation in that area.  He did have a  retroperitoneal hematoma after his catheterization.  His hemoglobin at discharge was 10.  He is due to recheck that today.  I discussed his case with his cardiologist who recommended starting him on Jardiance for his diabetes given the cardiovascular protective effects which certainly is reasonable.  Previously the patient had Candida balanitis and also dizziness associated with Jardiance however he understands the positive implications of this medication and he is willing to try it again.  He would like clotrimazole cream to have on hand in case he develops a rash on the glans penis.  He does report polyuria ever since starting the medication.  We also discontinued his pioglitazone given the reduced ejection fraction that the patient had in the hospital and the pulmonary edema.  I felt that medication was too dangerous for him to be taking now.  He denies any shortness of breath.  He denies any cough.  He denies any fever.  He denies any dysuria.  He is ambulating using a walker for balance.  He denies any leg swelling or evidence of a DVT.  At that time, my plan was: Switch the patient to [facility] and discontinue pioglitazone given his congestive heart failure and recent non-ST elevation myocardial infarction.  I  believe the long-term cardiovascular protective effects outweigh any potential risk.  I did give the patient a prescription for Clotrimazole cream to use in case he develops Candida balanitis.  Recheck a CBC to monitor his anemia.  Recommended a multivitamin with iron to help regenerate the blood loss he experienced in the hospital.  Patient's blood pressure today is well controlled.  Ideally I like the patient to be on an angiotensin receptor blocker or an ACE inhibitor.  However he feels extremely weak and dizzy right now so I will not start that at this time.  I will recheck the patient in a month and if his strength is improving and he is tolerating the Jardiance we may try a low-dose ACE inhibitor  at that time.  Patient received his flu shot today.  Also recommended discontinuation of testosterone given the potential increased risk of cardiovascular events on this medication.  06/07/19 Patient looks remarkable today.  He is doing very well.  He is tolerating the Jardiance without any complications however he has been applying nystatin to his glans on a daily basis.  I have discouraged that.  Instead I have recommended that he keep that area dry.  He can use of desiccant powder such as [employer].  I recommended only using nystatin if there is an active infection.  This way we avoid antibiotic resistance in the yeast.  He denies any chest pain.  His surgical scar on sternum is healing well.  He does have a small seroma on his medial right calf that appears today where they took the vein.  However there is no erythema or warmth in that area.  This seems to be slowly improving.  The large hematoma on his back has completely resolved.  He denies any hematuria or hematochezia or melena.  He denies any dyspnea on exertion.  He denies any pleurisy.  He denies any angina.  Blood pressures well controlled today.  My last visit I made mention of starting an ACE inhibitor however he is on Benicar and therefore this is not necessary.  Therefore he is already on a beta-blocker as well as an angiotensin receptor blocker plus Jardiance.  His preoperative ejection fraction was 25 to 30% however his intraoperative TEE showed an ejection fraction up to 50%.  Therefore Delene Loll is likely not necessary with beneficial.  Therefore he is on optimal medical therapy at this time for his congestive heart failure.  At that time, my plan was: Repeat the CMP today to monitor the patient's calcium.  Calcium is persistently elevated we may need to check his parathyroid hormone status.  His hemoglobin at his last visit was greater than 12 and therefore I will not recheck a CBC today.  His blood pressure is outstanding.  He is reporting  fasting blood sugars between 100-120 and his 2-hour postprandial sugars are under 150.  Therefore his diabetes seems well controlled.  I have recommended returning in 3 months to recheck an A1c and a fasting lipid panel.  Otherwise continue his current medical therapy.  Patient failed to get his flu shot at his last visit so he got it today.   07/12/19 Patient is here today for recheck.  He request that I check his lungs to make sure there is "no pneumonia.  He denies any fever or chills or chest pain or pleurisy or hemoptysis or purulent sputum.  He continues to have some burning and stinging pain in his anterior right thigh following the distribution of the femoral  nerve.  It also possibly follows the lateral femoral cutaneous nerve.  I suspect that this may be underlying nerve damage from his previous catheterization site.  He states that the burning has been there ever since his catheterization.  There is no visible abnormality on the skin.  There is no bruising or erythema or rash.  He also continues to complain of low back pain where he had significant hematoma after his catheterization.  However the bruising has improved there as well.  He is due for fasting lab work to monitor his A1c, etc. after November 28. Past Medical History:  Diagnosis Date   Allergic rhinitis, cause unspecified    Allergy    Anxiety    Arthritis    Cancer ([place])    Cataract    Chronic systolic CHF (congestive heart failure) ([place])    Coronary atherosclerosis of unspecified type of vessel, native or graft    a. MI 1985 with unclear details. b. CABG 03/2019 with LIMA -> LAD, SVG -> OM, SVG to dRCA.   Depression    PTSD    Diverticulosis of colon (without mention of hemorrhage)    Esophageal reflux    Esophageal stricture    Essential hypertension    Former tobacco use    Hemorrhoids    Hyperlipidemia    Irritable bowel syndrome    Ischemic cardiomyopathy    Myocardial infarction (HCC)     Nocturia    Obesity, unspecified    Orthostasis    Other acquired absence of organ    Personal history of colonic polyps    Psychosexual dysfunction with inhibited sexual excitement    Retroperitoneal bleed    Type II or unspecified type diabetes mellitus without mention of complication, not stated as uncontrolled    Past Surgical History:  Procedure Laterality Date   ADENOIDECTOMY     CARPAL TUNNEL RELEASE     left    CHOLECYSTECTOMY     COLON RESECTION     18 inches   COLON SURGERY  2013   CORONARY ANGIOPLASTY     1985    CORONARY ARTERY BYPASS GRAFT N/A 04/26/2019   Procedure: CORONARY ARTERY BYPASS GRAFTING (CABG) x Three, using left internal mammary artery and right leg greater saphenous vein harvested endoscopically;  Surgeon: Gaye Pollack, MD;  Location: [place] [facility] OR;  Service: Open Heart Surgery;  Laterality: N/A;   ELBOW BURSA SURGERY     EYE SURGERY     POLYPECTOMY     RIGHT/LEFT HEART CATH AND CORONARY ANGIOGRAPHY N/A 04/16/2019   Procedure: RIGHT/LEFT HEART CATH AND CORONARY ANGIOGRAPHY;  Surgeon: Belva Crome, MD;  Location: [facility];  Service: Cardiovascular;  Laterality: N/A;   SHOULDER SURGERY     Rt. Shoulder   TEE WITHOUT CARDIOVERSION N/A 04/26/2019   Procedure: TRANSESOPHAGEAL ECHOCARDIOGRAM (TEE);  Surgeon: Gaye Pollack, MD;  Location: [facility];  Service: Open Heart Surgery;  Laterality: N/A;   TONSILLECTOMY     Current Outpatient Medications on File Prior to Visit  Medication Sig Dispense Refill   acetaminophen (TYLENOL) 500 MG tablet Take 1,000 mg by mouth at bedtime as needed for mild pain.     aspirin EC 81 MG tablet Take 1 tablet (81 mg total) by mouth daily. 90 tablet 3   carvedilol (COREG) 3.125 MG tablet Take 1 tablet (3.125 mg total) by mouth 2 (two) times daily with a meal. 180 tablet 3   citalopram (CELEXA) 10 MG tablet Take 10 mg  by mouth daily.     clopidogrel (PLAVIX) 75 MG tablet Take 1 tablet (75 mg total) by  mouth daily. 90 tablet 3   colestipol (COLESTID) 5 g packet Take 5 g by mouth daily.      docusate sodium (COLACE) 100 MG capsule Take 200 mg by mouth daily.     empagliflozin (JARDIANCE) 25 MG TABS tablet Take 25 mg by mouth daily before breakfast. 90 tablet 3   fexofenadine (ALLEGRA) 180 MG tablet Take 1 tablet (180 mg total) by mouth daily as needed. For allergies 90 tablet 0   finasteride (PROSCAR) 5 MG tablet Take 1 tablet (5 mg total) by mouth daily. 90 tablet 3   fluticasone (FLONASE) 50 MCG/ACT nasal spray SPRAY 2 SPRAYS INTO EACH NOSTRIL EVERY DAY 48 mL 2   glipiZIDE (GLUCOTROL) 5 MG tablet TAKE 1 TABLET (5 MG TOTAL) BY MOUTH DAILY BEFORE BREAKFAST. 90 tablet 1   nystatin cream (MYCOSTATIN) Apply 1 application topically 3 (three) times daily. 60 g 0   olmesartan (BENICAR) 20 MG tablet Take 1 tablet (20 mg total) by mouth daily. 30 tablet 3   pantoprazole (PROTONIX) 40 MG tablet TAKE 1 TABLET (40 MG TOTAL) BY MOUTH EVERY MORNING. 90 tablet 3   pramoxine (PROCTOFOAM) 1 % foam Place 1 application rectally 3 (three) times daily as needed for anal itching. 15 g 3   rosuvastatin (CRESTOR) 40 MG tablet Take 1 tablet (40 mg total) by mouth daily at 6 PM. 90 tablet 3   No current facility-administered medications on file prior to visit.    Allergies  Allergen Reactions   Doxazosin Mesylate     UNKNOWN REACTION   Flomax [Tamsulosin Hcl] Itching        Penicillins Rash and Other (See Comments)    Has patient had a PCN reaction causing immediate rash, facial/tongue/throat swelling, SOB or lightheadedness with hypotension: No Has patient had a PCN reaction causing severe rash involving mucus membranes or skin necrosis: No Has patient had a PCN reaction that required hospitalization No Has patient had a PCN reaction occurring within the last 10 years: No If all of the above answers are "NO", then may proceed with Cephalosporin use.    Sulfonamide Derivatives Rash    Trulicity [Dulaglutide] Nausea And Vomiting    N&V, Dizziness   Social History   Socioeconomic History   Marital status: Married    Spouse name: Not on file   Number of children: 1   Years of education: 12   Highest education level: High school graduate  Occupational History   Occupation: [employer]  Scientist, product/process development strain: Not hard at all   Food insecurity    Worry: Never true    Inability: Never true   Transportation needs    Medical: No    Non-medical: No  Tobacco Use   Smoking status: Former Smoker    Quit date: 08/30/1983    Years since quitting: 35.8   Smokeless tobacco: Former Systems developer   Tobacco comment: Quit 30 years ago.  Substance and Sexual Activity   Alcohol use: Yes    Comment: rare   Drug use: No   Sexual activity: Yes  Lifestyle   Physical activity    Days per week: 0 days    Minutes per session: 0 min   Stress: Only a little  Relationships   Social connections    Talks on phone: Not on file    Gets together: Not on file  Attends religious service: Not on file    Active member of club or organization: Not on file    Attends meetings of clubs or organizations: Not on file    Relationship status: Not on file   Intimate partner violence    Fear of current or ex partner: Not on file    Emotionally abused: Not on file    Physically abused: Not on file    Forced sexual activity: Not on file  Other Topics Concern   Not on file  Social History Narrative   Not on file     Review of Systems  All other systems reviewed and are negative.      Objective:   Physical Exam Vitals signs reviewed.  Constitutional:      General: He is not in acute distress.    Appearance: He is obese. He is not ill-appearing or toxic-appearing.  Cardiovascular:     Rate and Rhythm: Normal rate and regular rhythm.     Pulses: Normal pulses.     Heart sounds: Normal heart sounds. No murmur. No friction rub. No gallop.   Pulmonary:      Effort: Pulmonary effort is normal. No respiratory distress.     Breath sounds: Normal breath sounds. No stridor. No wheezing, rhonchi or rales.  Chest:     Chest wall: No tenderness.  Abdominal:     General: Abdomen is flat. Bowel sounds are normal. There is no distension.     Palpations: Abdomen is soft. There is no mass.     Tenderness: There is no abdominal tenderness. There is no right CVA tenderness, left CVA tenderness, guarding or rebound.     Hernia: No hernia is present.  Musculoskeletal:     Right lower leg: No edema.     Left lower leg: No edema.  Neurological:     Mental Status: He is alert.           Assessment & Plan:  Anterior leg pain, right  I believe the pain in his anterior right leg over the quadriceps is likely neuropathic pain given the characteristics of burning and stinging.  He describes it as a fiery pain.  It appears to be following the pathway of the femoral nerve so I suspect it may be nerve compression due to inflammation from his previous catheterization in his right groin.  He had a hematoma that formed after catheterization there and I suspect the perhaps blood and swelling may have put pressure on the nerve causing irritation.  I hope that this will gradually improve over the next weeks to months.  His lungs sound completely clear today.  There is no evidence of any pneumonia.  I recommended returning fasting in 2 to 3 weeks to allow [place] to check fasting lab work including his A1c however his reported blood sugars are in the 1 25-1 30 range which sound acceptable.  His blood pressure today is well controlled at 130/70.

## 2019-07-15 ENCOUNTER — Encounter (HOSPITAL_COMMUNITY): Payer: No Typology Code available for payment source

## 2019-07-17 ENCOUNTER — Encounter (HOSPITAL_COMMUNITY): Payer: No Typology Code available for payment source

## 2019-07-17 DIAGNOSIS — G8311 Monoplegia of lower limb affecting right dominant side: Secondary | ICD-10-CM | POA: Diagnosis not present

## 2019-07-17 DIAGNOSIS — M47896 Other spondylosis, lumbar region: Secondary | ICD-10-CM | POA: Diagnosis not present

## 2019-07-17 DIAGNOSIS — M47816 Spondylosis without myelopathy or radiculopathy, lumbar region: Secondary | ICD-10-CM | POA: Diagnosis not present

## 2019-07-19 ENCOUNTER — Encounter (HOSPITAL_COMMUNITY): Payer: No Typology Code available for payment source

## 2019-07-22 ENCOUNTER — Encounter (HOSPITAL_COMMUNITY): Payer: No Typology Code available for payment source

## 2019-07-24 ENCOUNTER — Encounter (HOSPITAL_COMMUNITY): Payer: No Typology Code available for payment source

## 2019-07-26 ENCOUNTER — Encounter (HOSPITAL_COMMUNITY): Payer: No Typology Code available for payment source

## 2019-07-31 DIAGNOSIS — M1711 Unilateral primary osteoarthritis, right knee: Secondary | ICD-10-CM | POA: Diagnosis not present

## 2019-08-06 DIAGNOSIS — M47816 Spondylosis without myelopathy or radiculopathy, lumbar region: Secondary | ICD-10-CM | POA: Diagnosis not present

## 2019-08-06 DIAGNOSIS — M47896 Other spondylosis, lumbar region: Secondary | ICD-10-CM | POA: Diagnosis not present

## 2019-08-08 DIAGNOSIS — M25561 Pain in right knee: Secondary | ICD-10-CM | POA: Diagnosis not present

## 2019-08-09 ENCOUNTER — Other Ambulatory Visit: Payer: No Typology Code available for payment source

## 2019-08-09 ENCOUNTER — Encounter: Payer: Self-pay | Admitting: Family Medicine

## 2019-08-09 ENCOUNTER — Other Ambulatory Visit: Payer: Self-pay

## 2019-08-09 ENCOUNTER — Ambulatory Visit (INDEPENDENT_AMBULATORY_CARE_PROVIDER_SITE_OTHER): Payer: Medicare HMO | Admitting: Family Medicine

## 2019-08-09 VITALS — BP 122/74 | HR 72 | Temp 97.6°F | Resp 16 | Ht 71.0 in | Wt 218.0 lb

## 2019-08-09 DIAGNOSIS — E118 Type 2 diabetes mellitus with unspecified complications: Secondary | ICD-10-CM

## 2019-08-09 DIAGNOSIS — Z951 Presence of aortocoronary bypass graft: Secondary | ICD-10-CM | POA: Diagnosis not present

## 2019-08-09 NOTE — Progress Notes (Signed)
Subjective:    Patient ID: CALON FILAR, male    DOB: 1946/07/28, 73 y.o.   MRN: [id number]  HPI   Unfortunately, patient was recently admitted to the hospital and was found to have severe three-vessel coronary artery disease as well as congestive heart failure.  I have copied relevant portions of the discharge summary below for my reference:  Admit date: 04/12/2019 Discharge date: 05/02/2019  Admission Diagnoses:      Patient Active Problem List   Diagnosis Date Noted  . Hyponatremia 04/20/2019  . Retroperitoneal hematoma 04/18/2019  . Acute on chronic blood loss anemia 04/18/2019  . Acute systolic HF (heart failure) ([place]) 04/15/2019  . Non-ST elevation (NSTEMI) myocardial infarction ([place]) 04/15/2019  . AKI (acute kidney injury) ([place])   . Community acquired pneumonia of right lower lobe of lung ([place]) 04/12/2019  . Acute respiratory failure ([place]) 04/12/2019  . Allergic conjunctivitis 01/23/2018  . Chronic sinusitis 01/23/2018  . Gout 04/06/2012  . DIVERTICULITIS-COLON 06/18/2010  . FACIAL FLUSHING 12/11/2007  . NOCTURIA 12/11/2007  . Type 2 diabetes mellitus with hyperlipidemia ([place]) 09/07/2007  . OTHER MALAISE AND FATIGUE 09/07/2007  . Allergic rhinitis 08/16/2007  . HYPERLIPIDEMIA 01/24/2007  . OBESITY 01/24/2007  . ERECTILE DYSFUNCTION 01/24/2007  . HYPERTENSION 01/24/2007  . Coronary atherosclerosis 01/24/2007  . GERD 01/24/2007  . DIVERTICULOSIS, COLON 01/24/2007  . IRRITABLE BOWEL SYNDROME 01/24/2007  . COLONIC POLYPS, HX OF 01/24/2007  . CHOLECYSTECTOMY, HX OF 01/24/2007    Discharge Diagnoses:  Principal Problem:   Non-ST elevation (NSTEMI) myocardial infarction [facility]) Active Problems:   Type 2 diabetes mellitus with hyperlipidemia (HCC)   Coronary atherosclerosis   GERD   Community acquired pneumonia of right lower lobe of lung ([place])   Acute respiratory failure (HCC)   AKI (acute kidney injury) ([place])   Acute systolic HF (heart failure)  (HCC)   Retroperitoneal hematoma   Acute on chronic blood loss anemia   Hyponatremia   S/P CABG x 3   Discharged Condition: good  HPI:  The patient is a 73 year old gentleman with history of hypertension, diabetes, hyperlipidemia, coronary artery disease status post angioplasty in 1985 who was seen in the emergency room on 04/12/2019 for possible dehydration after working outside in the heat. His wife said they spent about 7 hours in the ER was given intravenous fluids and sent home. He returned with chills, fatigue, cough, and dyspnea and a CT scan of the chest showed right lower lobe pneumonia. High-sensitivity troponin was mildly elevated at 136 and subsequent values were 150 and 1,963. He developed progressive respiratory failure with hypoxia and tachypnea and required intubation in the emergency room. He was seen by PCCM.He was started on antibiotics. A post intubation chest x-ray showed central pulmonary edema. He was extubated the following day and did well initially and then developed acute respiratory distress requiring increased oxygen and diuresis. He had a stat echocardiogram which showed ejection fraction of 25 to 30% although it was poor quality. There is no significant valvular dysfunction. He underwent cardiac catheterization today showing severe three-vessel coronary disease. Results of his right heart catheterization arenot in the cath note although says that there was mild pulmonary hypertension and normal LVEDP.  The patient's wife is with him today. He denies any history of chest pain or pressure. He said that he stays active working in his yard. He has occasional episodes of exertional shortness of breath but stops to rest and it goes away and he resumes his activity. He feels  like his energy level has been good.   Hospital Course:   On 05/27/2019 Mr. Towry underwent a CABG x 3 with Dr. Cyndia Bent.  He was transferred to the surgical ICU for continued care.   He was extubated in a timely manner.  Heart failure was consulted to assist with milrinone titration.  Intra-Op estimated ejection fraction was 45 to 50%.  Postop day 1 we were able to wean him off of his epinephrine.  We continued milrinone for now.  We gave some albumin and Lasix to stimulate diuresis.  We transitioned his insulin drip to Levemir and sliding scale insulin.  We discontinued his chest tubes.  We initiated Lovenox therapy for DVT prophylaxis.  Postop day 2 his co-ox was 56% therefore we able to decrease milrinone to 0.125.  He was given a dose of IV Lasix.  We continue to encourage use of his incentive spirometer.  Postop day 3 his only complaint was regarding his pain in his buttocks from sitting in the chair.  We continue diuresis due to fluid overload.  His co-ox increased to 67%, therefore we discontinued milrinone at this time.  We will continue to encourage ambulation and incentive spirometer use.  His Levemir dose was decreased due to hypoglycemia.  Postop day 4 he was off all of his drips.  He felt like he was recovering well.  His kidney function remained stable.  His Lasix was discontinued but he remained on spironolactone.  He was stable at this time to transfer to the stepdown unit for continued care.  On the stepdown unit, his two-view chest x-ray showed Decreased left basilar and right upper lobe atelectasis and interval minimal right basilar atelectasis.  Today, he is ambulating with limited assistance, tolerating room air, his incisions are healing well, and he is ready for discharge.  05/07/19 Patient is here today for follow-up.  He feels extremely weak and tired after his open heart surgery which is to be expected.  The sternal incision appears to be healing well with no evidence of secondary cellulitis.  He does have a large hematoma on his right flank and just above his right gluteus.  However this appears to be fading.  He is tender to palpation in that area.  He did have a  retroperitoneal hematoma after his catheterization.  His hemoglobin at discharge was 10.  He is due to recheck that today.  I discussed his case with his cardiologist who recommended starting him on Jardiance for his diabetes given the cardiovascular protective effects which certainly is reasonable.  Previously the patient had Candida balanitis and also dizziness associated with Jardiance however he understands the positive implications of this medication and he is willing to try it again.  He would like clotrimazole cream to have on hand in case he develops a rash on the glans penis.  He does report polyuria ever since starting the medication.  We also discontinued his pioglitazone given the reduced ejection fraction that the patient had in the hospital and the pulmonary edema.  I felt that medication was too dangerous for him to be taking now.  He denies any shortness of breath.  He denies any cough.  He denies any fever.  He denies any dysuria.  He is ambulating using a walker for balance.  He denies any leg swelling or evidence of a DVT.  At that time, my plan was: Switch the patient to [facility] and discontinue pioglitazone given his congestive heart failure and recent non-ST elevation myocardial  infarction.  I believe the long-term cardiovascular protective effects outweigh any potential risk.  I did give the patient a prescription for Clotrimazole cream to use in case he develops Candida balanitis.  Recheck a CBC to monitor his anemia.  Recommended a multivitamin with iron to help regenerate the blood loss he experienced in the hospital.  Patient's blood pressure today is well controlled.  Ideally I like the patient to be on an angiotensin receptor blocker or an ACE inhibitor.  However he feels extremely weak and dizzy right now so I will not start that at this time.  I will recheck the patient in a month and if his strength is improving and he is tolerating the Jardiance we may try a low-dose ACE inhibitor  at that time.  Patient received his flu shot today.  Also recommended discontinuation of testosterone given the potential increased risk of cardiovascular events on this medication.  06/07/19 Patient looks remarkable today.  He is doing very well.  He is tolerating the Jardiance without any complications however he has been applying nystatin to his glans on a daily basis.  I have discouraged that.  Instead I have recommended that he keep that area dry.  He can use of desiccant powder such as Goldbond.  I recommended only using nystatin if there is an active infection.  This way we avoid antibiotic resistance in the yeast.  He denies any chest pain.  His surgical scar on sternum is healing well.  He does have a small seroma on his medial right calf that appears today where they took the vein.  However there is no erythema or warmth in that area.  This seems to be slowly improving.  The large hematoma on his back has completely resolved.  He denies any hematuria or hematochezia or melena.  He denies any dyspnea on exertion.  He denies any pleurisy.  He denies any angina.  Blood pressures well controlled today.  My last visit I made mention of starting an ACE inhibitor however he is on Benicar and therefore this is not necessary.  Therefore he is already on a beta-blocker as well as an angiotensin receptor blocker plus Jardiance.  His preoperative ejection fraction was 25 to 30% however his intraoperative TEE showed an ejection fraction up to 50%.  Therefore Delene Loll is likely not necessary with beneficial.  Therefore he is on optimal medical therapy at this time for his congestive heart failure.  At that time, my plan was: Repeat the CMP today to monitor the patient's calcium.  Calcium is persistently elevated we may need to check his parathyroid hormone status.  His hemoglobin at his last visit was greater than 12 and therefore I will not recheck a CBC today.  His blood pressure is outstanding.  He is reporting  fasting blood sugars between 100-120 and his 2-hour postprandial sugars are under 150.  Therefore his diabetes seems well controlled.  I have recommended returning in 3 months to recheck an A1c and a fasting lipid panel.  Otherwise continue his current medical therapy.  Patient failed to get his flu shot at his last visit so he got it today.  08/09/19 Since his hospitalization this summer, the patient has lost substantial weight.  As result his blood sugars have started to drop.  Recently he has been experiencing episodes of hypoglycemia where he feels dizzy and lightheaded and jittery.  He is also been having episodes of hypotension with systolic blood pressure approaching 100.  At this point,  the patient feels lightheaded and dizzy.  He states the highest his blood pressure has been has been in the 123456 systolic.  He denies any chest pain shortness of breath or dyspnea on exertion.  His last A1c was checked in August and is due again however he states the majority of his blood sugars are under 130.  He denies any polyuria polydipsia or blurry vision.   Past Medical History:  Diagnosis Date  . Allergic rhinitis, cause unspecified   . Allergy   . Anxiety   . Arthritis   . Cancer ([place])   . Cataract   . Chronic systolic CHF (congestive heart failure) ([place])   . Coronary atherosclerosis of unspecified type of vessel, native or graft    a. MI 1985 with unclear details. b. CABG 03/2019 with LIMA -> LAD, SVG -> OM, SVG to dRCA.  . Depression    PTSD   . Diverticulosis of colon (without mention of hemorrhage)   . Esophageal reflux   . Esophageal stricture   . Essential hypertension   . Former tobacco use   . Hemorrhoids   . Hyperlipidemia   . Irritable bowel syndrome   . Ischemic cardiomyopathy   . Myocardial infarction ([place])   . Nocturia   . Obesity, unspecified   . Orthostasis   . Other acquired absence of organ   . Personal history of colonic polyps   . Psychosexual dysfunction with  inhibited sexual excitement   . Retroperitoneal bleed   . Type II or unspecified type diabetes mellitus without mention of complication, not stated as uncontrolled    Past Surgical History:  Procedure Laterality Date  . ADENOIDECTOMY    . CARPAL TUNNEL RELEASE     left   . CHOLECYSTECTOMY    . COLON RESECTION     18 inches  . COLON SURGERY  2013  . CORONARY ANGIOPLASTY     1985   . CORONARY ARTERY BYPASS GRAFT N/A 04/26/2019   Procedure: CORONARY ARTERY BYPASS GRAFTING (CABG) x Three, using left internal mammary artery and right leg greater saphenous vein harvested endoscopically;  Surgeon: Gaye Pollack, MD;  Location: [facility];  Service: Open Heart Surgery;  Laterality: N/A;  . ELBOW BURSA SURGERY    . EYE SURGERY    . POLYPECTOMY    . RIGHT/LEFT HEART CATH AND CORONARY ANGIOGRAPHY N/A 04/16/2019   Procedure: RIGHT/LEFT HEART CATH AND CORONARY ANGIOGRAPHY;  Surgeon: Belva Crome, MD;  Location: [facility];  Service: Cardiovascular;  Laterality: N/A;  . SHOULDER SURGERY     Rt. Shoulder  . TEE WITHOUT CARDIOVERSION N/A 04/26/2019   Procedure: TRANSESOPHAGEAL ECHOCARDIOGRAM (TEE);  Surgeon: Gaye Pollack, MD;  Location: [facility];  Service: Open Heart Surgery;  Laterality: N/A;  . TONSILLECTOMY     Current Outpatient Medications on File Prior to Visit  Medication Sig Dispense Refill  . acetaminophen (TYLENOL) 500 MG tablet Take 1,000 mg by mouth at bedtime as needed for mild pain.    Marland Kitchen aspirin EC 81 MG tablet Take 1 tablet (81 mg total) by mouth daily. 90 tablet 3  . carvedilol (COREG) 3.125 MG tablet Take 1 tablet (3.125 mg total) by mouth 2 (two) times daily with a meal. 180 tablet 3  . citalopram (CELEXA) 10 MG tablet Take 10 mg by mouth daily.    . clopidogrel (PLAVIX) 75 MG tablet Take 1 tablet (75 mg total) by mouth daily. 90 tablet 3  . colestipol (COLESTID) 5 g  packet Take 5 g by mouth daily.     Marland Kitchen docusate sodium (COLACE) 100 MG capsule Take 200 mg by mouth daily.     . empagliflozin (JARDIANCE) 25 MG TABS tablet Take 25 mg by mouth daily before breakfast. 90 tablet 3  . fexofenadine (ALLEGRA) 180 MG tablet Take 1 tablet (180 mg total) by mouth daily as needed. For allergies 90 tablet 0  . finasteride (PROSCAR) 5 MG tablet Take 1 tablet (5 mg total) by mouth daily. 90 tablet 3  . fluticasone (FLONASE) 50 MCG/ACT nasal spray SPRAY 2 SPRAYS INTO EACH NOSTRIL EVERY DAY 48 mL 2  . glipiZIDE (GLUCOTROL) 5 MG tablet TAKE 1 TABLET (5 MG TOTAL) BY MOUTH DAILY BEFORE BREAKFAST. 90 tablet 1  . nystatin cream (MYCOSTATIN) Apply 1 application topically 3 (three) times daily. 60 g 0  . olmesartan (BENICAR) 20 MG tablet Take 1 tablet (20 mg total) by mouth daily. 30 tablet 3  . pantoprazole (PROTONIX) 40 MG tablet TAKE 1 TABLET (40 MG TOTAL) BY MOUTH EVERY MORNING. 90 tablet 3  . pramoxine (PROCTOFOAM) 1 % foam Place 1 application rectally 3 (three) times daily as needed for anal itching. 15 g 3  . rosuvastatin (CRESTOR) 40 MG tablet Take 1 tablet (40 mg total) by mouth daily at 6 PM. 90 tablet 3   No current facility-administered medications on file prior to visit.   Allergies  Allergen Reactions  . Doxazosin Mesylate     UNKNOWN REACTION  . Flomax [Tamsulosin Hcl] Itching       . Penicillins Rash and Other (See Comments)    Has patient had a PCN reaction causing immediate rash, facial/tongue/throat swelling, SOB or lightheadedness with hypotension: No Has patient had a PCN reaction causing severe rash involving mucus membranes or skin necrosis: No Has patient had a PCN reaction that required hospitalization No Has patient had a PCN reaction occurring within the last 10 years: No If all of the above answers are "NO", then may proceed with Cephalosporin use.   . Sulfonamide Derivatives Rash  . Trulicity [Dulaglutide] Nausea And Vomiting    N&V, Dizziness   Social History   Socioeconomic History  . Marital status: Married    Spouse name: Not on file  .  Number of children: 1  . Years of education: 49  . Highest education level: High school graduate  Occupational History  . 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 Smoking status: Former Smoker    Quit date: 08/30/1983    Years since quitting: 35.9  . Smokeless tobacco: Former Systems developer  . Tobacco comment: Quit 30 years ago.  Substance and Sexual Activity  . Alcohol use: Yes    Comment: rare  . Drug use: No  . Sexual activity: Yes  Other Topics Concern  . Not on file  Social History Narrative  . Not on file   Social Determinants of Health   Financial Resource Strain: Low Risk   . Difficulty of Paying Living Expenses: Not hard at all  Food Insecurity: No Food Insecurity  . Worried About Charity fundraiser in the Last Year: Never true  . Ran Out of Food in the Last Year: Never true  Transportation Needs: No Transportation Needs  . Lack of Transportation (Medical): No  . Lack of Transportation (Non-Medical): No  Physical Activity: Inactive  . Days of Exercise per Week: 0 days  . Minutes of Exercise per Session: 0 min  Stress: No Stress Concern Present  .  Feeling of Stress : Only a little  Social Connections:   . Frequency of Communication with Friends and Family: Not on file  . Frequency of Social Gatherings with Friends and Family: Not on file  . Attends Religious Services: Not on file  . Active Member of Clubs or Organizations: Not on file  . Attends Archivist Meetings: Not on file  . Marital Status: Not on file  Intimate Partner Violence:   . Fear of Current or Ex-Partner: Not on file  . Emotionally Abused: Not on file  . Physically Abused: Not on file  . Sexually Abused: Not on file     Review of Systems  All other systems reviewed and are negative.      Objective:   Physical Exam Vitals reviewed.  Constitutional:      General: He is not in acute distress.    Appearance: He is obese. He is not ill-appearing or toxic-appearing.  Cardiovascular:     Rate and  Rhythm: Normal rate and regular rhythm.     Pulses: Normal pulses.     Heart sounds: Normal heart sounds. No murmur. No friction rub. No gallop.   Pulmonary:     Effort: Pulmonary effort is normal. No respiratory distress.     Breath sounds: Normal breath sounds. No stridor. No wheezing, rhonchi or rales.  Chest:     Chest wall: No tenderness.  Abdominal:     General: Abdomen is flat. Bowel sounds are normal. There is no distension.     Palpations: Abdomen is soft. There is no mass.     Tenderness: There is no abdominal tenderness. There is no right CVA tenderness, left CVA tenderness, guarding or rebound.     Hernia: No hernia is present.  Musculoskeletal:     Right lower leg: No edema.     Left lower leg: No edema.  Neurological:     Mental Status: He is alert.           Assessment & Plan:  Controlled type 2 diabetes mellitus with complication, without long-term current use of insulin ([place]) - Plan: Hemoglobin A1c, CBC with Differential, COMPLETE METABOLIC PANEL WITH GFR, Lipid Panel  S/P CABG x 3 - Plan: Hemoglobin A1c, CBC with Differential, COMPLETE METABOLIC PANEL WITH GFR, Lipid Panel  I believe this patient is having episodes of hypotension.  I believe partly this is due to his lifestyle changes and weight loss coupled with the carvedilol, the Benicar, and the Jardiance.  I have recommended that he reduce his dose of Benicar from 20 mg a day to 10 mg a day and less monitor his blood pressure closely and see if his blood pressure stabilizes.  I explained the importance of the medication and helping his ejection fraction recover.  Therefore I would like to keep him on this medication if at all possible.  Patient understands and will monitor his blood pressure.  I have explained to the patient that his blood pressure is not dangerous as long as his systolic blood pressures greater than 100 unless he is having symptoms of hypotension.  I also believe he is having episodes of  hypoglycemia brought on by most likely glipizide and his weight loss.  Therefore I have recommended that the patient discontinue glipizide.  As an aside, the patient states that he feels anxious all the time.  He states that he constantly feels jittery and at times the anxiety gets out of control.  He has not been taking his  Celexa.  I have encouraged the patient to start taking Celexa 10 mg a day and allow 3 to 4 weeks for the medication to take effect.  If at that time the anxiety is still a problem we could increase the Celexa to 20 mg however I would not go beyond 20 mg due to his cardiac history and age.  Patient is comfortable with this plan.  More than 25 minutes was spent with the patient in discussion.

## 2019-08-10 LAB — CBC WITH DIFFERENTIAL/PLATELET
Absolute Monocytes: 518 cells/uL (ref 200–950)
Basophils Absolute: 28 cells/uL (ref 0–200)
Basophils Relative: 0.4 %
Eosinophils Absolute: 78 cells/uL (ref 15–500)
Eosinophils Relative: 1.1 %
HCT: 49.6 % (ref 38.5–50.0)
Hemoglobin: 15.9 g/dL (ref 13.2–17.1)
Lymphs Abs: 2535 cells/uL (ref 850–3900)
MCH: 28.1 pg (ref 27.0–33.0)
MCHC: 32.1 g/dL (ref 32.0–36.0)
MCV: 87.6 fL (ref 80.0–100.0)
MPV: 9.7 fL (ref 7.5–12.5)
Monocytes Relative: 7.3 %
Neutro Abs: 3941 cells/uL (ref 1500–7800)
Neutrophils Relative %: 55.5 %
Platelets: 161 10*3/uL (ref 140–400)
RBC: 5.66 10*6/uL (ref 4.20–5.80)
RDW: 13.6 % (ref 11.0–15.0)
Total Lymphocyte: 35.7 %
WBC: 7.1 10*3/uL (ref 3.8–10.8)

## 2019-08-10 LAB — HEMOGLOBIN A1C
Hgb A1c MFr Bld: 6.9 % of total Hgb — ABNORMAL HIGH (ref <5.7)
Mean Plasma Glucose: 151 (calc)
eAG (mmol/L): 8.4 (calc)

## 2019-08-10 LAB — COMPLETE METABOLIC PANEL WITH GFR
AG Ratio: 2 (calc) (ref 1.0–2.5)
ALT: 26 U/L (ref 9–46)
AST: 26 U/L (ref 10–35)
Albumin: 4.2 g/dL (ref 3.6–5.1)
Alkaline phosphatase (APISO): 78 U/L (ref 35–144)
BUN: 18 mg/dL (ref 7–25)
CO2: 25 mmol/L (ref 20–32)
Calcium: 10.5 mg/dL — ABNORMAL HIGH (ref 8.6–10.3)
Chloride: 101 mmol/L (ref 98–110)
Creat: 0.81 mg/dL (ref 0.70–1.18)
GFR, Est African American: 103 mL/min/{1.73_m2} (ref >=60)
GFR, Est Non African American: 89 mL/min/{1.73_m2} (ref >=60)
Globulin: 2.1 g/dL (calc) (ref 1.9–3.7)
Glucose, Bld: 146 mg/dL — ABNORMAL HIGH (ref 65–99)
Potassium: 4.6 mmol/L (ref 3.5–5.3)
Sodium: 139 mmol/L (ref 135–146)
Total Bilirubin: 0.5 mg/dL (ref 0.2–1.2)
Total Protein: 6.3 g/dL (ref 6.1–8.1)

## 2019-08-10 LAB — LIPID PANEL
Cholesterol: 132 mg/dL (ref <200)
HDL: 58 mg/dL (ref >=40)
LDL Cholesterol (Calc): 44 mg/dL (calc)
Non-HDL Cholesterol (Calc): 74 mg/dL (calc) (ref <130)
Total CHOL/HDL Ratio: 2.3 (calc) (ref <5.0)
Triglycerides: 237 mg/dL — ABNORMAL HIGH (ref <150)

## 2019-08-14 DIAGNOSIS — M1711 Unilateral primary osteoarthritis, right knee: Secondary | ICD-10-CM | POA: Diagnosis not present

## 2019-08-20 ENCOUNTER — Encounter: Payer: No Typology Code available for payment source | Admitting: Neurology

## 2019-08-22 ENCOUNTER — Other Ambulatory Visit (HOSPITAL_COMMUNITY): Payer: Self-pay | Admitting: Internal Medicine

## 2019-09-03 ENCOUNTER — Other Ambulatory Visit: Payer: Self-pay | Admitting: Family Medicine

## 2019-09-03 MED ORDER — LOTEPREDNOL ETABONATE 0.5 % OP SUSP: 1.0000 [drp] | Freq: Four times a day (QID) | OPHTHALMIC | 0 refills | Status: DC

## 2019-09-17 ENCOUNTER — Encounter: Payer: Self-pay | Admitting: Physician Assistant

## 2019-09-17 NOTE — Progress Notes (Signed)
Cardiology Office Note    Date:  09/19/2019   ID:  [name], [name] [birth year], MRN [id number]  PCP:  [name], MD  Cardiologist:  [name], MD (also follows with Dr. [name] - [facility]) Electrophysiologist:  None   Chief Complaint: routine f/u CAD, CHF  History of Present Illness:   Cameron Daniel is a 74 y.o. male with history of CAD ([name] with unclear details, then CABG 03/2019 with LIMA -> LAD, SVG -> OM, SVG to dRCA with ischemic cardiomyopathy/acute systolic CHF and spontaneous RP bleed),  HTN, DM2, HL, orthostasis, GERD, former tobacco abuse, diverticulosis, IBS, mild carotid artery disease, depression, esophageal stricture who presents for routine follow-up.  To recap he had MI in 1985 with unclear intervention, treated by [name] at that time.. Aortoiliac duplex 2014 showed no AAA. She did very well until recent admission. 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 In late August 2020, he developed fever and malaise. He was seen in the ER and given fluids and discharged but returned back with respiratory failure requiring intubation.He was seen by PCCM and started on antibiotics.A post intubation chest x-ray showed central pulmonary edema and PNA. 2D echo 04/13/19 showed EF 25-30%, severe hypokinesis of the left ventricular, entire inferior wall and anteroseptal wall, mild LAE, small pericardial effusion. He ruled in for NSTEMI. Coivd testing was negative. He underwent cardiac catheterization showing severe three-vessel coronary disease with low output. He was started on milrinone post-cath. He developed a large spontaneous RP bleed post cath (cath all done from arm). He was treated with milrinone and antibiotics. He ultimately underwent CABG on 04/26/19. Pre-op TEE EF 45-50%. Intra-op TEE showed EF 45-50%, mild LVH. Post CABG course was fortunately uneventful. Spironolactone was later stopped due to hyperkalemia. He has also  followed with Dr. [name], last OV 05/2019, doing great. Last outside labs personally reviewed include: 07/2019 LDL 44, trig 237, K 4.6, Cr 0.81, Hgb 15.9, A1C 6.9.  He returns for follow-up doing great. 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 No angina, dyspnea, palpitations, orthopnea, PND, syncope. He did notice a few weeks ago he was having a little bit of wooziness when standing at times. Dr. Dennard Schaumann cut his benicar back to 1/2 tablet daily (10mg  total) and discontinued his glipizide. He's felt well since.   Past Medical History:  Diagnosis Date  . Allergic rhinitis, cause unspecified   . Allergy   . Anxiety   . Arthritis   . Cancer ([place])   . Cataract   . Chronic systolic CHF (congestive heart failure) ([place])   . Coronary atherosclerosis of unspecified type of vessel, native or graft    a. MI 1985 with unclear details. b. CABG 03/2019 with LIMA -> LAD, SVG -> OM, SVG to dRCA.  . Depression    PTSD   . Diverticulosis of colon (without mention of hemorrhage)   . Esophageal reflux   . Esophageal stricture   . Essential hypertension   . Former tobacco use   . Hemorrhoids   . Hyperlipidemia   . Irritable bowel syndrome   . Ischemic cardiomyopathy   . Mild carotid artery disease ([facility])    a. 1-39% bilaterally 03/2019 duplex.  . Myocardial infarction ([name])   . Nocturia   . Obesity, unspecified   . Orthostasis   . Other acquired absence of organ   . Personal history of colonic polyps   .  Psychosexual dysfunction with inhibited sexual excitement   . Retroperitoneal bleed   . Type II or unspecified type diabetes mellitus without mention of complication, not stated as uncontrolled     Past Surgical History:  Procedure Laterality Date  . ADENOIDECTOMY    . CARPAL TUNNEL RELEASE     left   . CHOLECYSTECTOMY    . COLON RESECTION     18 inches  . COLON SURGERY  2013  . CORONARY ANGIOPLASTY     1985   . CORONARY ARTERY BYPASS GRAFT N/A  04/26/2019   Procedure: CORONARY ARTERY BYPASS GRAFTING (CABG) x Three, using left internal mammary artery and right leg greater saphenous vein harvested endoscopically;  Surgeon: Gaye Pollack, MD;  Location: [facility];  Service: Open Heart Surgery;  Laterality: N/A;  . ELBOW BURSA SURGERY    . EYE SURGERY    . POLYPECTOMY    . RIGHT/LEFT HEART CATH AND CORONARY ANGIOGRAPHY N/A 04/16/2019   Procedure: RIGHT/LEFT HEART CATH AND CORONARY ANGIOGRAPHY;  Surgeon: Belva Crome, MD;  Location: [facility];  Service: Cardiovascular;  Laterality: N/A;  . SHOULDER SURGERY     Rt. Shoulder  . TEE WITHOUT CARDIOVERSION N/A 04/26/2019   Procedure: TRANSESOPHAGEAL ECHOCARDIOGRAM (TEE);  Surgeon: Gaye Pollack, MD;  Location: [facility];  Service: Open Heart Surgery;  Laterality: N/A;  . TONSILLECTOMY      Current Medications: Current Meds  Medication Sig  . acetaminophen (TYLENOL) 500 MG tablet Take 1,000 mg by mouth at bedtime as needed for mild pain.  Marland Kitchen aspirin EC 81 MG tablet Take 1 tablet (81 mg total) by mouth daily.  . carvedilol (COREG) 3.125 MG tablet Take 1 tablet (3.125 mg total) by mouth 2 (two) times daily with a meal.  . citalopram (CELEXA) 10 MG tablet Take 10 mg by mouth daily.  . clopidogrel (PLAVIX) 75 MG tablet Take 1 tablet (75 mg total) by mouth daily.  . colestipol (COLESTID) 5 g packet Take 5 g by mouth daily.   . empagliflozin (JARDIANCE) 25 MG TABS tablet Take 25 mg by mouth daily before breakfast.  . fexofenadine (ALLEGRA) 180 MG tablet Take 1 tablet (180 mg total) by mouth daily as needed. For allergies  . finasteride (PROSCAR) 5 MG tablet Take 1 tablet (5 mg total) by mouth daily.  . fluticasone (FLONASE) 50 MCG/ACT nasal spray SPRAY 2 SPRAYS INTO EACH NOSTRIL EVERY DAY  . loteprednol (LOTEMAX) 0.5 % ophthalmic suspension Place 1 drop into both eyes 4 (four) times daily.  Marland Kitchen nystatin cream (MYCOSTATIN) Apply 1 application topically 3 (three) times daily.  Marland Kitchen olmesartan  (BENICAR) 20 MG tablet Take 10 mg by mouth daily.  . pantoprazole (PROTONIX) 40 MG tablet TAKE 1 TABLET (40 MG TOTAL) BY MOUTH EVERY MORNING.  . pramoxine (PROCTOFOAM) 1 % foam Place 1 application rectally 3 (three) times daily as needed for anal itching.  . rosuvastatin (CRESTOR) 40 MG tablet Take 1 tablet (40 mg total) by mouth daily at 6 PM.  . [DISCONTINUED] docusate sodium (COLACE) 100 MG capsule Take 200 mg by mouth daily.  . [DISCONTINUED] glipiZIDE (GLUCOTROL) 5 MG tablet TAKE 1 TABLET (5 MG TOTAL) BY MOUTH DAILY BEFORE BREAKFAST.  . [DISCONTINUED] olmesartan (BENICAR) 20 MG tablet TAKE 1 TABLET BY MOUTH EVERY DAY (Patient taking differently: Take 10 mg by mouth daily. )      Allergies:   Doxazosin mesylate, Flomax [tamsulosin hcl], Penicillins, Sulfonamide derivatives, and Trulicity [dulaglutide]   Social History  Socioeconomic History  . Marital status: Married    Spouse name: Not on file  . Number of children: 1  . Years of education: 42  . Highest education level: High school graduate  Occupational History  . 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 Smoking status: Former Smoker    Quit date: 08/30/1983    Years since quitting: 36.0  . Smokeless tobacco: Former Systems developer  . Tobacco comment: Quit 30 years ago.  Substance and Sexual Activity  . Alcohol use: Yes    Comment: rare  . Drug use: No  . Sexual activity: Yes  Other Topics Concern  . Not on file  Social History Narrative  . Not on file   Social Determinants of Health   Financial Resource Strain: Low Risk   . Difficulty of Paying Living Expenses: Not hard at all  Food Insecurity: No Food Insecurity  . Worried About Charity fundraiser in the Last Year: Never true  . Ran Out of Food in the Last Year: Never true  Transportation Needs: No Transportation Needs  . Lack of Transportation (Medical): No  . Lack of Transportation (Non-Medical): No  Physical Activity: Inactive  . Days of Exercise per Week: 0 days  .  Minutes of Exercise per Session: 0 min  Stress: No Stress Concern Present  . Feeling of Stress : Only a little  Social Connections:   . Frequency of Communication with Friends and Family: Not on file  . Frequency of Social Gatherings with Friends and Family: Not on file  . Attends Religious Services: Not on file  . Active Member of Clubs or Organizations: Not on file  . Attends Archivist Meetings: Not on file  . Marital Status: Not on file     Family History:  The patient's family history includes Allergic rhinitis in his father; Coronary artery disease in his mother; Lung cancer in his father. There is no history of Colon cancer, Heart attack, Hypertension, Stroke, Esophageal cancer, Rectal cancer, Stomach cancer, or Pancreatic cancer.  ROS:   Please see the history of present illness.  All other systems are reviewed and otherwise negative.    EKGs/Labs/Other Studies Reviewed:    Studies reviewed are outlined and summarized above.  INTRAOP TEE 04/26/19 (most recent) POST-OP IMPRESSIONS - Left Ventricle: The left ventricle is unchanged from pre-bypass. has mildly reduced systolic function. - Aorta: The aorta appears unchanged from pre-bypass. - Aortic Valve: The aortic valve appears unchanged from pre-bypass. - Mitral Valve: The mitral valve appears unchanged from pre-bypass. - Tricuspid Valve: The tricuspid valve appears unchanged from pre-bypass. - Interatrial Septum: The interatrial septum appears unchanged from pre-bypass.  PRE-OP FINDINGS  Left Ventricle: The left ventricle has mildly reduced systolic function, with an ejection fraction of 45-50%. The cavity size was normal. mild concentric LV hypertrophy. There was hypokinesis of inferior septum. The LV wall thickness was 1.15 cm.  Right Ventricle: The right ventricle has normal systolic function. The cavity was normal. There is no increase in right ventricular wall thickness.  Left Atrium: Left atrial size  was normal in size. The left atrial appendage is well visualized and there is no evidence of thrombus present.  Right Atrium: Right atrial size was normal in size.  Interatrial Septum: No atrial level shunt detected by color flow Doppler. Increased thickness of the atrial septum sparing the fossa ovalis consistent with lipomatous hypertrophy of the interatrial septum.  Pericardium: Trivial pericardial effusion is present. The pericardial effusion is posterior to  the left ventricle.  Mitral Valve: The mitral valve is degenerative in appearance. Mild thickening of the mitral valve leaflet. Moderate calcification of the mitral annulus. Mitral valve regurgitation is trivial by color flow Doppler. There is no evidence of mitral valve  vegetation. There was moderate mitral annular calcification most prominent at the base of the posterior leaflet. There was restricted motion of the posterior leaflet, but no evidence of mitral stenosis.    Tricuspid Valve: The tricuspid valve was normal in structure. Tricuspid valve regurgitation is trivial by color flow Doppler. The tricuspid valve is mildly thickened.  Aortic Valve: The aortic valve is tricuspid There is moderate thickening of the aortic valve and There is moderate sclerosis of the aortic valve aortic valve regurgitation was not visualized by color flow Doppler. There is no evidence of aortic valve  stenosis. There is no evidence of a vegetation on the aortic valve.  Pulmonic Valve: The pulmonic valve was normal in structure. Pulmonic valve regurgitation is not visualized by color flow Doppler.   Aorta: There was a well defined aortic root and sino-tubular junction without dilatation or effacement. There was intimal thickening and calcification of the walls of the asecending aorta especially at the area of the right coronary cusp. There was no  protruding atheromatous disease present.  The asecending aorta and aortic roort were normal in  diameter.  The descending aorta was normal in diameter with intimal thickening present.    Roberts Gaudy MD Electronically signed by Roberts Gaudy MD Signature Date/Time: 04/26/2019/5:01:43 PM   Final    2D echo 03/2019 IMPRESSIONS  1. The left ventricle has severely reduced systolic function, with an ejection fraction of 25-30%. The cavity size was normal. There is mildly increased left ventricular wall thickness. Left ventricular diastolic Doppler parameters are indeterminate.  2. Severe hypokinesis of the left ventricular, entire inferior wall and anteroseptal wall.  3. Left atrial size was mildly dilated.  4. Small pericardial effusion.  5. The pericardial effusion is posterior to the left ventricle.  6. The mitral valve is abnormal. Mild thickening of the mitral valve leaflet. There is mild to moderate mitral annular calcification present.  7. The aortic valve is tricuspid. Mild sclerosis of the aortic valve. No stenosis of the aortic valve.  8. The aorta is normal unless otherwise noted.  9. The inferior vena cava was dilated in size with <50% respiratory variability. 10. The interatrial septum was not assessed.  Carotid duplex 03/2019 Summary: Right Carotid: Velocities in the right ICA are consistent with a 1-39% stenosis.  Left Carotid: Velocities in the left ICA are consistent with a 1-39% stenosis. Vertebrals:  Left vertebral artery demonstrates antegrade flow. Right vertebral              artery was not visualized. Subclavians: Normal flow hemodynamics were seen in bilateral subclavian              arteries.  Right ABI: Resting right ankle-brachial index indicates noncompressible right lower extremity arteries. Left ABI: Resting left ankle-brachial index indicates noncompressible left lower extremity arteries. Right Upper Extremity: Doppler waveform obliterate with right radial compression. Doppler waveform obliterate with right ulnar compression. Left Upper Extremity:  Doppler waveforms decrease 50% with left radial compression. Doppler waveforms decrease 50% with left ulnar compression.      EKG:  EKG is not ordered today.  Recent Labs: 04/12/2019: B Natriuretic Peptide 359.3 04/21/2019: TSH 2.142 04/27/2019: Magnesium 1.7 08/09/2019: ALT 26; BUN 18; Creat 0.81; Hemoglobin 15.9; Platelets 161; Potassium  4.6; Sodium 139  Recent Lipid Panel    Component Value Date/Time   CHOL 132 08/09/2019 0819   TRIG 237 (H) 08/09/2019 0819   TRIG 193 (H) 08/08/2006 0924   HDL 58 08/09/2019 0819   CHOLHDL 2.3 08/09/2019 0819   VLDL 40 (H) 12/15/2016 0815   LDLCALC 44 08/09/2019 0819   LDLDIRECT 49.9 08/26/2013 1011    PHYSICAL EXAM:    VS:  BP 116/66   Pulse 77   Ht 5\' 11"  (1.803 m)   Wt 218 lb (98.9 kg)   SpO2 98%   BMI 30.40 kg/m   BMI: Body mass index is 30.4 kg/m.  GEN: Well nourished, well developed WM, in no acute distress HEENT: normocephalic, atraumatic Neck: no JVD, carotid bruits, or masses Cardiac: RRR; no murmurs, rubs, or gallops, no edema  Respiratory:  clear to auscultation bilaterally, normal work of breathing GI: soft, nontender, nondistended, + BS MS: no deformity or atrophy Skin: warm and dry, no rash Neuro:  Alert and Oriented x 3, Strength and sensation are intact, follows commands Psych: euthymic mood, full affect  Wt Readings from Last 3 Encounters:  09/19/19 218 lb (98.9 kg)  08/09/19 218 lb (98.9 kg)  07/12/19 219 lb (99.3 kg)     ASSESSMENT & PLAN:   1. CAD s/p CABG - clinically doing great. Continue ASA, statin, BB, Plavix. He was not initially on Plavix after hospitalization due to RP bleed, but later added per d/w Dr. [name]. He's doing fine on this regimen and f/u Hgb 07/2019 was normal. Anticipate DAPT for 1 year. Will have him f/u in 6 months with Dr. Meda Coffee at which time the ultimate duration can be discussed. 2. Chronic systolic CHF - appears euvolemic. Doing well. No new concerns. Benicar recently decreased  by PCP. 3. Essential HTN - Benicar recently decreased by PCP. BP looks good today - has been high in the past but he has lost some weight since his CABG which likely contributed. Eating habits have changed. Will continue present regimen for now, but told patient have low threshold to let us know if orthostatic-type symptoms recur at which time I would recommend further reduction in Benicar. Would continue carvedilol when possible. 4. Hyperlipidemia - recent LDL at goal. Triglycerides were not. Discussed option of Vascepa but he wishes to hold off in lieu of lifestyle optimization first. 5. Mild carotid artery disease - noted on duplex in 2020. Anticipate f/u will be due around late 2021-2022. This can be determined at time of next f/u in 6 months.  Disposition: F/u with Dr. Meda Coffee in 6 months. He is also due to see Dr. [name] in April.  Medication Adjustments/Labs and Tests Ordered: Current medicines are reviewed at length with the patient today.  Concerns regarding medicines are outlined above. Medication changes, Labs and Tests ordered today are summarized above and listed in the Patient Instructions accessible in Encounters.   Signed, Charlie Pitter, PA-C  09/19/2019 2:04 PM    [facility] [address], [place], [place]  [address] Phone: [phone]; Fax: [phone]

## 2019-09-19 ENCOUNTER — Ambulatory Visit: Payer: Medicare HMO | Admitting: Physician Assistant

## 2019-09-19 ENCOUNTER — Other Ambulatory Visit: Payer: Self-pay

## 2019-09-19 ENCOUNTER — Encounter: Payer: Self-pay | Admitting: Physician Assistant

## 2019-09-19 VITALS — BP 116/66 | HR 77 | Ht 71.0 in | Wt 218.0 lb

## 2019-09-19 DIAGNOSIS — I10 Essential (primary) hypertension: Secondary | ICD-10-CM | POA: Diagnosis not present

## 2019-09-19 DIAGNOSIS — I5022 Chronic systolic (congestive) heart failure: Secondary | ICD-10-CM | POA: Diagnosis not present

## 2019-09-19 DIAGNOSIS — E785 Hyperlipidemia, unspecified: Secondary | ICD-10-CM

## 2019-09-19 DIAGNOSIS — I779 Disorder of arteries and arterioles, unspecified: Secondary | ICD-10-CM

## 2019-09-19 DIAGNOSIS — Z951 Presence of aortocoronary bypass graft: Secondary | ICD-10-CM | POA: Diagnosis not present

## 2019-09-19 DIAGNOSIS — I2510 Atherosclerotic heart disease of native coronary artery without angina pectoris: Secondary | ICD-10-CM | POA: Diagnosis not present

## 2019-09-19 NOTE — Patient Instructions (Signed)
Medication Instructions:  Your physician recommends that you continue on your current medications as directed. Please refer to the Current Medication list given to you today.  *If you need a refill on your cardiac medications before your next appointment, please call your pharmacy*  Lab Work: None ordered  If you have labs (blood work) drawn today and your tests are completely normal, you will receive your results only by: Marland Kitchen MyChart Message (if you have MyChart) OR . A paper copy in the mail If you have any lab test that is abnormal or we need to change your treatment, we will call you to review the results.  Testing/Procedures: None ordered  Follow-Up: At [facility], you and your health needs are our priority.  As part of our continuing mission to provide you with exceptional heart care, we have created designated Provider Care Teams.  These Care Teams include your primary Cardiologist (physician) and Advanced Practice Providers (APPs -  Physician Assistants and Nurse Practitioners) who all work together to provide you with the care you need, when you need it.  Your next appointment:   6 month(s)  The format for your next appointment:   In Person  Provider:   You may see [name], MD or one of the following Advanced Practice Providers on your designated Care Team:    Melina Copa, PA-C  Ermalinda Barrios, PA-C

## 2019-09-26 ENCOUNTER — Encounter: Payer: Self-pay | Admitting: Family Medicine

## 2019-09-26 ENCOUNTER — Ambulatory Visit (INDEPENDENT_AMBULATORY_CARE_PROVIDER_SITE_OTHER): Payer: Medicare HMO | Admitting: Family Medicine

## 2019-09-26 ENCOUNTER — Other Ambulatory Visit: Payer: Self-pay

## 2019-09-26 VITALS — BP 110/70 | HR 72 | Temp 97.2°F | Resp 16 | Ht 71.0 in | Wt 218.0 lb

## 2019-09-26 DIAGNOSIS — H04129 Dry eye syndrome of unspecified lacrimal gland: Secondary | ICD-10-CM

## 2019-09-26 DIAGNOSIS — M545 Low back pain, unspecified: Secondary | ICD-10-CM

## 2019-09-26 DIAGNOSIS — I951 Orthostatic hypotension: Secondary | ICD-10-CM | POA: Diagnosis not present

## 2019-09-26 NOTE — Progress Notes (Signed)
Subjective:    Patient ID: Cameron Daniel, male    DOB: 07-06-1946, 74 y.o.   MRN: [id number]  HPI Patient is a very pleasant 74 year old Caucasian male who presents today with 3 separate concerns.  Primary concern is a drop in his blood pressure.  Recently we had the patient decrease his Benicar to 10 mg a day due to orthostatic dizziness and hypotension.  He states that his blood pressure is still between [id number] systolic however whenever he stands up from a chair or gets out of his truck, he becomes extremely lightheaded and feels like he may pass out.  This only occurs with changes in position such as standing.  He denies any tachycardia or palpitations.  The symptoms last for 30 seconds and then will gradually subside.  Second issue is itching in the left.  Patient takes Patanol and Allegra every day but continues to have itching in his left.  We gave him a temporary prescription of Lotemax which really seem to help the itching however that prescription has expired and he is wanting a refill.  I explained to the patient that constant prolonged use of topical corticosteroids can cause glaucoma in the and contribute to cataracts.  I would not recommend this simply for itching.  Today the conjunctiva appears white.  There is no obvious inflammation in the eye.  The surface of the sclera however does appear dry.  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  I believe this indicates chronic dry.  He has not been using any lubricating drops or Restasis.  He states he uses them occasionally.  He also complains of pain in his left lower back just above the pelvic/iliac crest.  He reports pain with flexion and extension of his spine.  Hurts to bend over to pick up things.  He is unable to take NSAIDs due to his cardiovascular disease. Past Medical History:  Diagnosis Date  . Allergic rhinitis, cause unspecified   . Allergy   . Anxiety   . Arthritis   . Cancer ([place])   . Cataract   .  Chronic systolic CHF (congestive heart failure) ([place])   . Coronary atherosclerosis of unspecified type of vessel, native or graft    a. MI 1985 with unclear details. b. CABG 03/2019 with LIMA -> LAD, SVG -> OM, SVG to dRCA.  . Depression    PTSD   . Diverticulosis of colon (without mention of hemorrhage)   . Esophageal reflux   . Esophageal stricture   . Essential hypertension   . Former tobacco use   . Hemorrhoids   . Hyperlipidemia   . Irritable bowel syndrome   . Ischemic cardiomyopathy   . Mild carotid artery disease ([facility])    a. 1-39% bilaterally 03/2019 duplex.  . Myocardial infarction ([place])   . Nocturia   . Obesity, unspecified   . Orthostasis   . Other acquired absence of organ   . Personal history of colonic polyps   . Psychosexual dysfunction with inhibited sexual excitement   . Retroperitoneal bleed   . Type II or unspecified type diabetes mellitus without mention of complication, not stated as uncontrolled    Past Surgical History:  Procedure Laterality Date  . ADENOIDECTOMY    . CARPAL TUNNEL RELEASE     left   . CHOLECYSTECTOMY    . COLON RESECTION     18 inches  . COLON SURGERY  2013  . CORONARY ANGIOPLASTY  1985   . CORONARY ARTERY BYPASS GRAFT N/A 04/26/2019   Procedure: CORONARY ARTERY BYPASS GRAFTING (CABG) x Three, using left internal mammary artery and right leg greater saphenous vein harvested endoscopically;  Surgeon: Gaye Pollack, MD;  Location: [facility];  Service: Open Heart Surgery;  Laterality: N/A;  . ELBOW BURSA SURGERY    . EYE SURGERY    . POLYPECTOMY    . RIGHT/LEFT HEART CATH AND CORONARY ANGIOGRAPHY N/A 04/16/2019   Procedure: RIGHT/LEFT HEART CATH AND CORONARY ANGIOGRAPHY;  Surgeon: Belva Crome, MD;  Location: [facility];  Service: Cardiovascular;  Laterality: N/A;  . SHOULDER SURGERY     Rt. Shoulder  . TEE WITHOUT CARDIOVERSION N/A 04/26/2019   Procedure: TRANSESOPHAGEAL ECHOCARDIOGRAM (TEE);  Surgeon: Gaye Pollack, MD;   Location: [facility];  Service: Open Heart Surgery;  Laterality: N/A;  . TONSILLECTOMY     Current Outpatient Medications on File Prior to Visit  Medication Sig Dispense Refill  . acetaminophen (TYLENOL) 500 MG tablet Take 1,000 mg by mouth at bedtime as needed for mild pain.    Marland Kitchen aspirin EC 81 MG tablet Take 1 tablet (81 mg total) by mouth daily. 90 tablet 3  . carvedilol (COREG) 3.125 MG tablet Take 1 tablet (3.125 mg total) by mouth 2 (two) times daily with a meal. 180 tablet 3  . citalopram (CELEXA) 10 MG tablet Take 10 mg by mouth daily.    . clopidogrel (PLAVIX) 75 MG tablet Take 1 tablet (75 mg total) by mouth daily. 90 tablet 3  . colestipol (COLESTID) 5 g packet Take 5 g by mouth daily.     . empagliflozin (JARDIANCE) 25 MG TABS tablet Take 25 mg by mouth daily before breakfast. 90 tablet 3  . fexofenadine (ALLEGRA) 180 MG tablet Take 1 tablet (180 mg total) by mouth daily as needed. For allergies 90 tablet 0  . finasteride (PROSCAR) 5 MG tablet Take 1 tablet (5 mg total) by mouth daily. 90 tablet 3  . fluticasone (FLONASE) 50 MCG/ACT nasal spray SPRAY 2 SPRAYS INTO EACH NOSTRIL EVERY DAY 48 mL 2  . loteprednol (LOTEMAX) 0.5 % ophthalmic suspension Place 1 drop into both eyes 4 (four) times daily. 5 mL 0  . nystatin cream (MYCOSTATIN) Apply 1 application topically 3 (three) times daily. 60 g 0  . olmesartan (BENICAR) 20 MG tablet Take 10 mg by mouth daily.    . pantoprazole (PROTONIX) 40 MG tablet TAKE 1 TABLET (40 MG TOTAL) BY MOUTH EVERY MORNING. 90 tablet 3  . rosuvastatin (CRESTOR) 40 MG tablet Take 1 tablet (40 mg total) by mouth daily at 6 PM. 90 tablet 3   No current facility-administered medications on file prior to visit.   Allergies  Allergen Reactions  . Doxazosin Mesylate     UNKNOWN REACTION  . Flomax [Tamsulosin Hcl] Itching       . Penicillins Rash and Other (See Comments)    Has patient had a PCN reaction causing immediate rash, facial/tongue/throat swelling, SOB or  lightheadedness with hypotension: No Has patient had a PCN reaction causing severe rash involving mucus membranes or skin necrosis: No Has patient had a PCN reaction that required hospitalization No Has patient had a PCN reaction occurring within the last 10 years: No If all of the above answers are "NO", then may proceed with Cephalosporin use.   . Sulfonamide Derivatives Rash  . Trulicity [Dulaglutide] Nausea And Vomiting    N&V, Dizziness   Social History  Socioeconomic History  . Marital status: Married    Spouse name: Not on file  . Number of children: 1  . Years of education: 48  . Highest education level: High school graduate  Occupational History  . 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 Smoking status: Former Smoker    Quit date: 08/30/1983    Years since quitting: 36.0  . Smokeless tobacco: Former Systems developer  . Tobacco comment: Quit 30 years ago.  Substance and Sexual Activity  . Alcohol use: Yes    Comment: rare  . Drug use: No  . Sexual activity: Yes  Other Topics Concern  . Not on file  Social History Narrative  . Not on file   Social Determinants of Health   Financial Resource Strain: Low Risk   . Difficulty of Paying Living Expenses: Not hard at all  Food Insecurity: No Food Insecurity  . Worried About Charity fundraiser in the Last Year: Never true  . Ran Out of Food in the Last Year: Never true  Transportation Needs: No Transportation Needs  . Lack of Transportation (Medical): No  . Lack of Transportation (Non-Medical): No  Physical Activity: Inactive  . Days of Exercise per Week: 0 days  . Minutes of Exercise per Session: 0 min  Stress: No Stress Concern Present  . Feeling of Stress : Only a little  Social Connections:   . Frequency of Communication with Friends and Family: Not on file  . Frequency of Social Gatherings with Friends and Family: Not on file  . Attends Religious Services: Not on file  . Active Member of Clubs or Organizations: Not on file    . Attends Archivist Meetings: Not on file  . Marital Status: Not on file  Intimate Partner Violence:   . Fear of Current or Ex-Partner: Not on file  . Emotionally Abused: Not on file  . Physically Abused: Not on file  . Sexually Abused: Not on file      Review of Systems  All other systems reviewed and are negative.      Objective:   Physical Exam Vitals reviewed.  Constitutional:      Appearance: Normal appearance. He is obese.  Eyes:     General:        Left eye: No discharge.     Conjunctiva/sclera:     Left eye: Left conjunctiva is not injected. No chemosis or exudate. Cardiovascular:     Rate and Rhythm: Normal rate and regular rhythm.     Heart sounds: Normal heart sounds. No murmur. No friction rub. No gallop.   Pulmonary:     Effort: Pulmonary effort is normal.     Breath sounds: Normal breath sounds. No wheezing or rales.  Musculoskeletal:     Lumbar back: Spasms and tenderness present. No bony tenderness. Decreased range of motion.       Back:  Neurological:     Mental Status: He is alert.           Assessment & Plan:  Orthostatic hypotension  Dry eye  Low back pain, unspecified back pain laterality, unspecified chronicity, unspecified whether sciatica present  Patient continues to have orthostatic hypotension despite lowering the dose of his Benicar.  We discussed this at length and he feels that the negative outweighs the positive and he would like to discontinue Benicar.  He will continue Jardiance along with his carvedilol.  I believe the itching is is a combination of allergies but primarily chronic  dry.  I recommended that he start taking his Restasis on a daily basis and he can also use the lubricating drops as well.  I believe the low back pain is likely muscular.  I recommended performing stretches every day to help improve his flexibility.  He can also use Voltaren gel in that area to see if it will help relieve some of the  inflammation.

## 2019-10-08 DIAGNOSIS — D6869 Other thrombophilia: Secondary | ICD-10-CM | POA: Insufficient documentation

## 2019-10-08 DIAGNOSIS — M533 Sacrococcygeal disorders, not elsewhere classified: Secondary | ICD-10-CM | POA: Diagnosis not present

## 2019-10-08 DIAGNOSIS — Z951 Presence of aortocoronary bypass graft: Secondary | ICD-10-CM | POA: Diagnosis not present

## 2019-10-11 ENCOUNTER — Other Ambulatory Visit: Payer: Self-pay

## 2019-10-11 ENCOUNTER — Telehealth (HOSPITAL_COMMUNITY): Payer: Self-pay | Admitting: *Deleted

## 2019-10-11 ENCOUNTER — Ambulatory Visit (INDEPENDENT_AMBULATORY_CARE_PROVIDER_SITE_OTHER): Payer: Medicare HMO | Admitting: Family Medicine

## 2019-10-11 ENCOUNTER — Encounter: Payer: Self-pay | Admitting: Family Medicine

## 2019-10-11 VITALS — BP 128/80 | HR 77 | Temp 96.5°F | Resp 18 | Ht 71.0 in | Wt 214.0 lb

## 2019-10-11 DIAGNOSIS — R1031 Right lower quadrant pain: Secondary | ICD-10-CM | POA: Diagnosis not present

## 2019-10-11 DIAGNOSIS — M25551 Pain in right hip: Secondary | ICD-10-CM

## 2019-10-11 DIAGNOSIS — K661 Hemoperitoneum: Secondary | ICD-10-CM

## 2019-10-11 DIAGNOSIS — K4090 Unilateral inguinal hernia, without obstruction or gangrene, not specified as recurrent: Secondary | ICD-10-CM

## 2019-10-11 NOTE — Telephone Encounter (Signed)
Pt left VM asking if he is to continue plavix and aspirin. Pt said he had triple bypass in September and wants to know how much longer he has to be on plavix.  Routed to [place]

## 2019-10-11 NOTE — Progress Notes (Signed)
Subjective:    Patient ID: Cameron Daniel, male    DOB: 1946/03/24, 74 y.o.   MRN: [id number]  HPI Patient reports worsening left-sided low back pain, left groin pain, and increasing weakness lifting/flexing his left hip.  Patient had a catheterization in August.  He suffered a large retroperitoneal hematoma after the catheterization.  The CT scan from August comments that blood was tracking inferiorly and pressing against the iliopsoas muscle/hip flexor as well as tracking into a right inguinal hernia.  Today the patient complains of pain in the right inguinal canal.  On physical exam he does have a small palpable inguinal hernia with Valsalva maneuver.  This elicits pain when he coughs.  He also complains of pain radiating into his right testicle however on examination there is no tenderness to palpation on the right testicle and there is no tenderness to palpation on the epididymis.  He also complains of pain with hip flexion.  However he also has weakness with hip flexion.  He states that he is unable to flex his hip to climb steps.  Hip flexion is 3/5 compared to 5/5 in the left leg.  I can easily overcome it with resistance.  This exacerbates the patient's pain.  He has no pain with internal or external rotation of the hip.  He also has some pain deep in the right side of his lower back which he pinpoints to the area just above his right gluteus. "Moderately large retroperitoneal hematoma on the right, displacing the right kidney anteriorly and extending inferiorly as far as the inferior pelvis, into the proximal portion of a right inguinal hernia containing fat this is also involving the right psoas muscle and iliopsoas muscle."   Past Surgical History:  Procedure Laterality Date  . ADENOIDECTOMY    . CARPAL TUNNEL RELEASE     left   . CHOLECYSTECTOMY    . COLON RESECTION     18 inches  . COLON SURGERY  2013  . CORONARY ANGIOPLASTY     1985   . CORONARY ARTERY BYPASS GRAFT N/A  04/26/2019   Procedure: CORONARY ARTERY BYPASS GRAFTING (CABG) x Three, using left internal mammary artery and right leg greater saphenous vein harvested endoscopically;  Surgeon: Gaye Pollack, MD;  Location: [facility];  Service: Open Heart Surgery;  Laterality: N/A;  . ELBOW BURSA SURGERY    . EYE SURGERY    . POLYPECTOMY    . RIGHT/LEFT HEART CATH AND CORONARY ANGIOGRAPHY N/A 04/16/2019   Procedure: RIGHT/LEFT HEART CATH AND CORONARY ANGIOGRAPHY;  Surgeon: Belva Crome, MD;  Location: [facility];  Service: Cardiovascular;  Laterality: N/A;  . SHOULDER SURGERY     Rt. Shoulder  . TEE WITHOUT CARDIOVERSION N/A 04/26/2019   Procedure: TRANSESOPHAGEAL ECHOCARDIOGRAM (TEE);  Surgeon: Gaye Pollack, MD;  Location: [facility];  Service: Open Heart Surgery;  Laterality: N/A;  . TONSILLECTOMY     Current Outpatient Medications on File Prior to Visit  Medication Sig Dispense Refill  . acetaminophen (TYLENOL) 500 MG tablet Take 1,000 mg by mouth at bedtime as needed for mild pain.    Marland Kitchen aspirin EC 81 MG tablet Take 1 tablet (81 mg total) by mouth daily. 90 tablet 3  . carvedilol (COREG) 3.125 MG tablet Take 1 tablet (3.125 mg total) by mouth 2 (two) times daily with a meal. 180 tablet 3  . citalopram (CELEXA) 10 MG tablet Take 10 mg by mouth daily.    . clopidogrel (PLAVIX)  75 MG tablet Take 1 tablet (75 mg total) by mouth daily. 90 tablet 3  . colestipol (COLESTID) 5 g packet Take 5 g by mouth daily.     . empagliflozin (JARDIANCE) 25 MG TABS tablet Take 25 mg by mouth daily before breakfast. 90 tablet 3  . fexofenadine (ALLEGRA) 180 MG tablet Take 1 tablet (180 mg total) by mouth daily as needed. For allergies 90 tablet 0  . finasteride (PROSCAR) 5 MG tablet Take 1 tablet (5 mg total) by mouth daily. 90 tablet 3  . fluticasone (FLONASE) 50 MCG/ACT nasal spray SPRAY 2 SPRAYS INTO EACH NOSTRIL EVERY DAY 48 mL 2  . loteprednol (LOTEMAX) 0.5 % ophthalmic suspension Place 1 drop into both eyes 4  (four) times daily. 5 mL 0  . nystatin cream (MYCOSTATIN) Apply 1 application topically 3 (three) times daily. 60 g 0  . olmesartan (BENICAR) 20 MG tablet Take 10 mg by mouth daily.    . pantoprazole (PROTONIX) 40 MG tablet TAKE 1 TABLET (40 MG TOTAL) BY MOUTH EVERY MORNING. 90 tablet 3  . rosuvastatin (CRESTOR) 40 MG tablet Take 1 tablet (40 mg total) by mouth daily at 6 PM. 90 tablet 3   No current facility-administered medications on file prior to visit.   Allergies  Allergen Reactions  . Doxazosin Mesylate     UNKNOWN REACTION  . Flomax [Tamsulosin Hcl] Itching       . Penicillins Rash and Other (See Comments)    Has patient had a PCN reaction causing immediate rash, facial/tongue/throat swelling, SOB or lightheadedness with hypotension: No Has patient had a PCN reaction causing severe rash involving mucus membranes or skin necrosis: No Has patient had a PCN reaction that required hospitalization No Has patient had a PCN reaction occurring within the last 10 years: No If all of the above answers are "NO", then may proceed with Cephalosporin use.   . Sulfonamide Derivatives Rash  . Trulicity [Dulaglutide] Nausea And Vomiting    N&V, Dizziness   Social History   Socioeconomic History  . Marital status: Married    Spouse name: Not on file  . Number of children: 1  . Years of education: 39  . Highest education level: High school graduate  Occupational History  . 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 Smoking status: Former Smoker    Quit date: 08/30/1983    Years since quitting: 36.1  . Smokeless tobacco: Former Systems developer  . Tobacco comment: Quit 30 years ago.  Substance and Sexual Activity  . Alcohol use: Yes    Comment: rare  . Drug use: No  . Sexual activity: Yes  Other Topics Concern  . Not on file  Social History Narrative  . Not on file   Social Determinants of Health   Financial Resource Strain: Low Risk   . Difficulty of Paying Living Expenses: Not hard at all    Food Insecurity: No Food Insecurity  . Worried About Charity fundraiser in the Last Year: Never true  . Ran Out of Food in the Last Year: Never true  Transportation Needs: No Transportation Needs  . Lack of Transportation (Medical): No  . Lack of Transportation (Non-Medical): No  Physical Activity: Inactive  . Days of Exercise per Week: 0 days  . Minutes of Exercise per Session: 0 min  Stress: No Stress Concern Present  . Feeling of Stress : Only a little  Social Connections:   . Frequency of Communication with Friends and Family: Not  on file  . Frequency of Social Gatherings with Friends and Family: Not on file  . Attends Religious Services: Not on file  . Active Member of Clubs or Organizations: Not on file  . Attends Archivist Meetings: Not on file  . Marital Status: Not on file  Intimate Partner Violence:   . Fear of Current or Ex-Partner: Not on file  . Emotionally Abused: Not on file  . Physically Abused: Not on file  . Sexually Abused: Not on file      Review of Systems  All other systems reviewed and are negative.      Objective:   Physical Exam Vitals reviewed.  Constitutional:      Appearance: Normal appearance. He is obese.  Eyes:     General:        Left eye: No discharge.     Conjunctiva/sclera:     Left eye: Left conjunctiva is not injected. No chemosis or exudate. Cardiovascular:     Rate and Rhythm: Normal rate and regular rhythm.     Heart sounds: Normal heart sounds. No murmur. No friction rub. No gallop.   Pulmonary:     Effort: Pulmonary effort is normal.     Breath sounds: Normal breath sounds. No wheezing or rales.  Abdominal:     Hernia: A hernia is present. Hernia is present in the right inguinal area.  Genitourinary:    Testes: Normal.        Right: Tenderness or swelling not present.     Epididymis:     Right: Normal.  Musculoskeletal:     Lumbar back: Spasms and tenderness present. Decreased range of motion. Negative  right straight leg raise test and negative left straight leg raise test.       Back:     Right hip: No tenderness or bony tenderness. Normal range of motion. Decreased strength.       Legs:  Lymphadenopathy:     Lower Body: No right inguinal adenopathy.  Neurological:     Mental Status: He is alert.           Assessment & Plan:  Right inguinal hernia  Retroperitoneal hematoma  RLQ abdominal pain  Right hip pain  If the patient simply had a hematoma in this area his pain should be improving.  We are now 6 months out and the pain is worsening.  It seems to have acutely worsened over the last few weeks.  He now has marked weakness with hip flexion.  Hip flexion is limited to 3/5 strength.  He has constant low back pain in the area where the hematoma was located.  He complains of pain radiating into his right groin and into his right inguinal canal where he does have an inguinal hernia.  I will obtain a CT scan of the abdomen and pelvis to reevaluate the hematoma and ensure that it is not developed into some type of abscess.  I do believe that the hematoma is the cause of his pain pattern.  If the CT scan shows no evidence of an abscess or complication, the hematoma will have to absorb gradually on its own and therefore I would recommend physical therapy for the back pain in the right hip pain.  I do not believe that this is lumbar radiculopathy or hip pathology causing his pain.

## 2019-10-11 NOTE — Telephone Encounter (Signed)
Continue for 1 year.

## 2019-10-14 ENCOUNTER — Encounter: Payer: Self-pay | Admitting: Family Medicine

## 2019-10-14 NOTE — Telephone Encounter (Signed)
Patient returned call to provider line  Aware he should continue plavix and ASA x 1 year appreciative of information/time  Nothing further needed at this time

## 2019-10-14 NOTE — Telephone Encounter (Signed)
Called pt no answer. Will try again later.

## 2019-10-15 DIAGNOSIS — H0288A Meibomian gland dysfunction right eye, upper and lower eyelids: Secondary | ICD-10-CM | POA: Diagnosis not present

## 2019-10-15 DIAGNOSIS — H0288B Meibomian gland dysfunction left eye, upper and lower eyelids: Secondary | ICD-10-CM | POA: Diagnosis not present

## 2019-10-15 DIAGNOSIS — E119 Type 2 diabetes mellitus without complications: Secondary | ICD-10-CM | POA: Diagnosis not present

## 2019-10-22 ENCOUNTER — Inpatient Hospital Stay: Admission: RE | Admit: 2019-10-22 | Payer: Medicare HMO | Source: Ambulatory Visit

## 2019-10-25 ENCOUNTER — Other Ambulatory Visit: Payer: Medicare HMO

## 2019-11-01 ENCOUNTER — Other Ambulatory Visit: Payer: Self-pay

## 2019-11-01 ENCOUNTER — Ambulatory Visit
Admission: RE | Admit: 2019-11-01 | Discharge: 2019-11-01 | Disposition: A | Discharge status: Home / Self Care | Payer: Medicare HMO | Source: Ambulatory Visit | Attending: Family Medicine | Admitting: Family Medicine

## 2019-11-01 DIAGNOSIS — R1031 Right lower quadrant pain: Secondary | ICD-10-CM

## 2019-11-01 DIAGNOSIS — K4090 Unilateral inguinal hernia, without obstruction or gangrene, not specified as recurrent: Secondary | ICD-10-CM

## 2019-11-01 DIAGNOSIS — M25551 Pain in right hip: Secondary | ICD-10-CM

## 2019-11-01 DIAGNOSIS — K661 Hemoperitoneum: Secondary | ICD-10-CM

## 2019-11-01 MED ORDER — IOPAMIDOL (ISOVUE-300) INJECTION 61%
100.0000 mL | Freq: Once | INTRAVENOUS | Status: AC | PRN
  Administered 2019-11-01: 100 mL via INTRAVENOUS

## 2019-11-06 DIAGNOSIS — M5416 Radiculopathy, lumbar region: Secondary | ICD-10-CM | POA: Diagnosis not present

## 2019-11-30 DIAGNOSIS — M5416 Radiculopathy, lumbar region: Secondary | ICD-10-CM | POA: Insufficient documentation

## 2019-12-07 DIAGNOSIS — M5416 Radiculopathy, lumbar region: Secondary | ICD-10-CM | POA: Diagnosis not present

## 2019-12-23 DIAGNOSIS — M545 Low back pain: Secondary | ICD-10-CM | POA: Diagnosis not present

## 2019-12-24 DIAGNOSIS — E119 Type 2 diabetes mellitus without complications: Secondary | ICD-10-CM | POA: Diagnosis not present

## 2019-12-27 ENCOUNTER — Telehealth: Payer: Self-pay | Admitting: Family Medicine

## 2019-12-27 DIAGNOSIS — M545 Low back pain, unspecified: Secondary | ICD-10-CM

## 2019-12-27 NOTE — Telephone Encounter (Signed)
Pt went to see ortho and was given Celebrex and told that it would not interfere with any of his current medications. When he got to the pharmacy they told him that it had an interaction with Plavix. He does not want to take it until you say it is ok.

## 2019-12-27 NOTE — Telephone Encounter (Signed)
It increases his risk of GI bleeding to take it with plavix.  I would not take it.

## 2019-12-30 NOTE — Telephone Encounter (Signed)
Pt aware via vm 

## 2019-12-30 NOTE — Telephone Encounter (Signed)
Pt would like to know if we can refer him to another ortho other then Dr. Nelva Bush for his back pain. Pt does not feel like he is helping at all the the pain in his back. He has had 2 ESI and it has not made his back feel any better.

## 2019-12-31 NOTE — Telephone Encounter (Signed)
I am fine with a referral to another orthopedic group.  Please schedule. Thanks.

## 2019-12-31 NOTE — Telephone Encounter (Signed)
Referral orders placed

## 2019-12-31 NOTE — Addendum Note (Signed)
Addended by: Launa Grill on: 12/31/2019 08:46 AM   Modules accepted: Orders

## 2020-01-02 ENCOUNTER — Other Ambulatory Visit: Payer: Self-pay

## 2020-01-02 ENCOUNTER — Ambulatory Visit (INDEPENDENT_AMBULATORY_CARE_PROVIDER_SITE_OTHER): Payer: Medicare HMO

## 2020-01-02 ENCOUNTER — Encounter: Payer: Self-pay | Admitting: Orthopaedic Surgery

## 2020-01-02 ENCOUNTER — Ambulatory Visit (INDEPENDENT_AMBULATORY_CARE_PROVIDER_SITE_OTHER): Payer: Medicare HMO | Admitting: Orthopaedic Surgery

## 2020-01-02 VITALS — Ht 71.0 in | Wt 212.0 lb

## 2020-01-02 DIAGNOSIS — M545 Low back pain, unspecified: Secondary | ICD-10-CM | POA: Insufficient documentation

## 2020-01-02 DIAGNOSIS — M25551 Pain in right hip: Secondary | ICD-10-CM

## 2020-01-02 DIAGNOSIS — M5441 Lumbago with sciatica, right side: Secondary | ICD-10-CM

## 2020-01-02 DIAGNOSIS — G8929 Other chronic pain: Secondary | ICD-10-CM | POA: Diagnosis not present

## 2020-01-02 MED ORDER — METHYLPREDNISOLONE ACETATE 40 MG/ML IJ SUSP
40.0000 mg | INTRAMUSCULAR | Status: AC | PRN
  Administered 2020-01-02: 40 mg via INTRAMUSCULAR

## 2020-01-02 MED ORDER — LIDOCAINE HCL 1 % IJ SOLN
2.0000 mL | INTRAMUSCULAR | Status: AC | PRN
  Administered 2020-01-02: 2 mL

## 2020-01-02 NOTE — Progress Notes (Signed)
Office Visit Note   Patient: Cameron Daniel           Date of Birth: 19-Oct-1945           MRN: [id number] Visit Date: 01/02/2020              Requested by: [name], MD [address] [facility] [place],  [place] [address] PCP: [name], MD   Assessment & Plan: Visit Diagnoses:  1. Pain in right hip   2. Chronic right-sided low back pain with right-sided sciatica     Plan: Cameron Daniel has had a chronic problem with his lumbar spine and has seen Dr. Jeanell Sparrow most at [facility].  He has had some injections in his spine but notes that they really have not helped and they "do not listen to me".  His pain is localized along the right parasacral region that he can localize.  He does have an area of trigger point tenderness and what appears to be a fibroid nodule.  I am going to inject that with Depo-Medrol and monitor his response.  Has had some referred pain into his buttock and his right groin.  He does have some mild arthritis on his x-rays involving the right hip but not much change from prior films.  So, there could be a combination of factors involved but the back pain seems to be to worsen.  We will monitor his response  Follow-Up Instructions: Return if symptoms worsen or fail to improve.   Orders:  Orders Placed This Encounter  Procedures  . XR Lumbar Spine 2-3 Views  . XR Pelvis 1-2 Views   No orders of the defined types were placed in this encounter.     Procedures: Trigger Point Inj  Date/Time: 01/02/2020 11:36 AM Performed by: Garald Balding, MD Authorized by: Garald Balding, MD   Consent Given by:  Patient Indications:  Pain Total # of Trigger Points:  1 Location: back   Needle Size:  25 G Approach:  Dorsal Medications #1:  2 mL lidocaine 1 %; 40 mg methylPREDNISolone acetate 40 MG/ML     Clinical Data: No additional findings.   Subjective: Chief Complaint  Patient presents with  . Right Hip - Pain  Patient presents today  for right hip pain. He states that he has lower right back pain that radiates into the backside of his thigh for 6 months. No known injury. No numbness or tingling. He states that his right leg is weaker now. He does have groin pain. He takes tylenol for pain. He states that weightbearing or getting up causes him pain. He feels like his gait is altered since this started.  He has seen Dr. Herma Mering at [facility] for injections in his lumbar spine.  He notes that they "do not listen to me" and the injections have not helped.  He is localizing his pain more the right parasacral region. He had a triple coronary artery bypass in August 2021 prior to which he was hospitalized for pneumonia.  Presently doing well HPI  Review of Systems  Constitutional: Negative for fatigue.  HENT: Negative for ear pain.   Eyes: Negative for pain.  Respiratory: Negative for shortness of breath.   Cardiovascular: Negative for leg swelling.  Gastrointestinal: Positive for constipation and diarrhea.  Endocrine: Negative for cold intolerance and heat intolerance.  Genitourinary: Negative for difficulty urinating.  Musculoskeletal: Negative for joint swelling.  Skin: Negative for rash.  Allergic/Immunologic: Negative  for food allergies.  Neurological: Positive for weakness.  Hematological: Bruises/bleeds easily.  Psychiatric/Behavioral: Negative for sleep disturbance.     Objective: Vital Signs: Ht 5\' 11"  (1.803 m)   Wt 212 lb (96.2 kg)   BMI 29.57 kg/m   Physical Exam Constitutional:      Appearance: He is well-developed.  Eyes:     Pupils: Pupils are equal, round, and reactive to light.  Pulmonary:     Effort: Pulmonary effort is normal.  Skin:    General: Skin is warm and dry.  Neurological:     Mental Status: He is alert and oriented to person, place, and time.  Psychiatric:        Behavior: Behavior normal.     Ortho Exam awake alert and oriented x3.  Comfortable sitting.  Localizes pain  directly over the area of fibroadipose nodule formation in the right parasacral region.  Straight leg raise was minimally positive for pain in that same area but not into his right lower extremity.  Had some decreased motion of his both hips but with minimal pain.  Loss of motion was but tickly with internal rotation.  Walks without a limp.  Might have some early neuropathy in both feet with some burning related to his diabetes. good pulses and no edema  Specialty Comments:  No specialty comments available.  Imaging: XR Lumbar Spine 2-3 Views  Result Date: 01/02/2020 Films lumbar spine obtained in 2 projections.  There is significant degenerative disc disease at L5-S1 with narrowing of the disc space and anterior and posterior osteophytes.  There are sclerotic changes at the facet joints L4-5 and L5-S1.  No obvious listhesis.  No obvious compression fracture  XR Pelvis 1-2 Views  Result Date: 01/02/2020 AP of the pelvis demonstrates some degenerative changes of both hips.  Not much change from prior films.  Mild subchondral cyst formation particularly in the superior lateral right femoral head and some calcification of the lateral acetabulum.  Films are consistent with mild to moderate osteoarthritis    PMFS History: Patient Active Problem List   Diagnosis Date Noted  . Low back pain 01/02/2020  . 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 Hyponatremia 04/20/2019  . Retroperitoneal hematoma 04/18/2019  . Acute on chronic blood loss anemia 04/18/2019  . Acute systolic HF (heart failure) ([place]) 04/15/2019  . Non-ST elevation (NSTEMI) myocardial infarction ([place]) 04/15/2019  . AKI (acute kidney injury) ([place])   . Community acquired pneumonia of right lower lobe of lung 04/12/2019  . Acute respiratory failure ([place]) 04/12/2019  . Allergic conjunctivitis 01/23/2018  . Chronic sinusitis 01/23/2018  . Gout 04/06/2012  . DIVERTICULITIS-COLON 06/18/2010  . FACIAL FLUSHING 12/11/2007  . NOCTURIA 12/11/2007  .  Type 2 diabetes mellitus with hyperlipidemia ([place]) 09/07/2007  . OTHER MALAISE AND FATIGUE 09/07/2007  . Allergic rhinitis 08/16/2007  . HYPERLIPIDEMIA 01/24/2007  . OBESITY 01/24/2007  . ERECTILE DYSFUNCTION 01/24/2007  . HYPERTENSION 01/24/2007  . Coronary atherosclerosis 01/24/2007  . GERD 01/24/2007  . DIVERTICULOSIS, COLON 01/24/2007  . IRRITABLE BOWEL SYNDROME 01/24/2007  . COLONIC POLYPS, HX OF 01/24/2007  . CHOLECYSTECTOMY, HX OF 01/24/2007   Past Medical History:  Diagnosis Date  . Allergic rhinitis, cause unspecified   . Allergy   . Anxiety   . Arthritis   . Cancer ([place])   . Cataract   . Chronic systolic CHF (congestive heart failure) ([place])   . Coronary atherosclerosis of unspecified type of vessel, native or graft    a. MI 1985  with unclear details. b. CABG 03/2019 with LIMA -> LAD, SVG -> OM, SVG to dRCA.  . Depression    PTSD   . Diverticulosis of colon (without mention of hemorrhage)   . Esophageal reflux   . Esophageal stricture   . Essential hypertension   . Former tobacco use   . Hemorrhoids   . Hyperlipidemia   . Irritable bowel syndrome   . Ischemic cardiomyopathy   . Mild carotid artery disease ([facility])    a. 1-39% bilaterally 03/2019 duplex.  . Myocardial infarction ([place])   . Nocturia   . Obesity, unspecified   . Orthostasis   . Other acquired absence of organ   . Personal history of colonic polyps   . Psychosexual dysfunction with inhibited sexual excitement   . Retroperitoneal bleed   . Type II or unspecified type diabetes mellitus without mention of complication, not stated as uncontrolled     Family History  Problem Relation Age of Onset  . Lung cancer Father   . Allergic rhinitis Father   . Coronary artery disease Mother   . Colon cancer Neg Hx   . Heart attack Neg Hx   . Hypertension Neg Hx   . Stroke Neg Hx   . Esophageal cancer Neg Hx   . Rectal cancer Neg Hx   . Stomach cancer Neg Hx   . Pancreatic cancer Neg Hx     Past Surgical  History:  Procedure Laterality Date  . ADENOIDECTOMY    . CARPAL TUNNEL RELEASE     left   . CHOLECYSTECTOMY    . COLON RESECTION     18 inches  . COLON SURGERY  2013  . CORONARY ANGIOPLASTY     1985   . CORONARY ARTERY BYPASS GRAFT N/A 04/26/2019   Procedure: CORONARY ARTERY BYPASS GRAFTING (CABG) x Three, using left internal mammary artery and right leg greater saphenous vein harvested endoscopically;  Surgeon: Gaye Pollack, MD;  Location: [facility];  Service: Open Heart Surgery;  Laterality: N/A;  . ELBOW BURSA SURGERY    . EYE SURGERY    . POLYPECTOMY    . RIGHT/LEFT HEART CATH AND CORONARY ANGIOGRAPHY N/A 04/16/2019   Procedure: RIGHT/LEFT HEART CATH AND CORONARY ANGIOGRAPHY;  Surgeon: Belva Crome, MD;  Location: [facility];  Service: Cardiovascular;  Laterality: N/A;  . SHOULDER SURGERY     Rt. Shoulder  . TEE WITHOUT CARDIOVERSION N/A 04/26/2019   Procedure: TRANSESOPHAGEAL ECHOCARDIOGRAM (TEE);  Surgeon: Gaye Pollack, MD;  Location: [facility];  Service: Open Heart Surgery;  Laterality: N/A;  . TONSILLECTOMY     Social History   Occupational History  . 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 Smoking status: Former Smoker    Quit date: 08/30/1983    Years since quitting: 36.3  . Smokeless tobacco: Former Systems developer  . Tobacco comment: Quit 30 years ago.  Substance and Sexual Activity  . Alcohol use: Yes    Comment: rare  . Drug use: No  . Sexual activity: Yes

## 2020-03-05 ENCOUNTER — Other Ambulatory Visit: Payer: Self-pay

## 2020-03-05 ENCOUNTER — Ambulatory Visit (INDEPENDENT_AMBULATORY_CARE_PROVIDER_SITE_OTHER): Payer: Medicare HMO | Admitting: Family Medicine

## 2020-03-05 VITALS — BP 110/60 | HR 78 | Temp 97.4°F | Ht 71.0 in | Wt 224.0 lb

## 2020-03-05 DIAGNOSIS — Z9229 Personal history of other drug therapy: Secondary | ICD-10-CM

## 2020-03-05 DIAGNOSIS — Z23 Encounter for immunization: Secondary | ICD-10-CM | POA: Diagnosis not present

## 2020-03-05 DIAGNOSIS — I5022 Chronic systolic (congestive) heart failure: Secondary | ICD-10-CM

## 2020-03-05 DIAGNOSIS — E118 Type 2 diabetes mellitus with unspecified complications: Secondary | ICD-10-CM | POA: Diagnosis not present

## 2020-03-05 DIAGNOSIS — Z125 Encounter for screening for malignant neoplasm of prostate: Secondary | ICD-10-CM | POA: Diagnosis not present

## 2020-03-05 DIAGNOSIS — Z951 Presence of aortocoronary bypass graft: Secondary | ICD-10-CM | POA: Diagnosis not present

## 2020-03-05 MED ORDER — FINASTERIDE 5 MG PO TABS: 5.0000 mg | ORAL_TABLET | Freq: Every day | ORAL | 3 refills | Status: DC

## 2020-03-05 MED ORDER — CITALOPRAM HYDROBROMIDE 10 MG PO TABS: 10.0000 mg | ORAL_TABLET | Freq: Every day | ORAL | 2 refills | Status: DC

## 2020-03-05 NOTE — Progress Notes (Signed)
Subjective:    Patient ID: Cameron Daniel, male    DOB: [birth year], 74 y.o.   MRN: [id number]  HPI  Unfortunately, patient was recently admitted to the hospital and was found to have severe three-vessel coronary artery disease as well as congestive heart failure.  I have copied relevant portions of the discharge summary below for my reference:  Admit date: 04/12/2019 Discharge date: 05/02/2019  Admission Diagnoses:      Patient Active Problem List   Diagnosis Date Noted  . Hyponatremia 04/20/2019  . Retroperitoneal hematoma 04/18/2019  . Acute on chronic blood loss anemia 04/18/2019  . Acute systolic HF (heart failure) ([place]) 04/15/2019  . Non-ST elevation (NSTEMI) myocardial infarction ([place]) 04/15/2019  . AKI (acute kidney injury) ([place])   . Community acquired pneumonia of right lower lobe of lung ([place]) 04/12/2019  . Acute respiratory failure ([place]) 04/12/2019  . Allergic conjunctivitis 01/23/2018  . Chronic sinusitis 01/23/2018  . Gout 04/06/2012  . DIVERTICULITIS-COLON 06/18/2010  . FACIAL FLUSHING 12/11/2007  . NOCTURIA 12/11/2007  . Type 2 diabetes mellitus with hyperlipidemia ([place]) 09/07/2007  . OTHER MALAISE AND FATIGUE 09/07/2007  . Allergic rhinitis 08/16/2007  . HYPERLIPIDEMIA 01/24/2007  . OBESITY 01/24/2007  . ERECTILE DYSFUNCTION 01/24/2007  . HYPERTENSION 01/24/2007  . Coronary atherosclerosis 01/24/2007  . GERD 01/24/2007  . DIVERTICULOSIS, COLON 01/24/2007  . IRRITABLE BOWEL SYNDROME 01/24/2007  . COLONIC POLYPS, HX OF 01/24/2007  . CHOLECYSTECTOMY, HX OF 01/24/2007    Discharge Diagnoses:  Principal Problem:   Non-ST elevation (NSTEMI) myocardial infarction [facility]) Active Problems:   Type 2 diabetes mellitus with hyperlipidemia (HCC)   Coronary atherosclerosis   GERD   Community acquired pneumonia of right lower lobe of lung ([place])   Acute respiratory failure (HCC)   AKI (acute kidney injury) ([place])   Acute systolic HF (heart failure) (HCC)    Retroperitoneal hematoma   Acute on chronic blood loss anemia   Hyponatremia   S/P CABG x 3   Discharged Condition: good  HPI:  The patient is a 74 year old gentleman with history of hypertension, diabetes, hyperlipidemia, coronary artery disease status post angioplasty in 1985 who was seen in the emergency room on 04/12/2019 for possible dehydration after working outside in the heat. His wife said they spent about 7 hours in the ER was given intravenous fluids and sent home. He returned with chills, fatigue, cough, and dyspnea and a CT scan of the chest showed right lower lobe pneumonia. High-sensitivity troponin was mildly elevated at 136 and subsequent values were 150 and 1,963. He developed progressive respiratory failure with hypoxia and tachypnea and required intubation in the emergency room. He was seen by PCCM.He was started on antibiotics. A post intubation chest x-ray showed central pulmonary edema. He was extubated the following day and did well initially and then developed acute respiratory distress requiring increased oxygen and diuresis. He had a stat echocardiogram which showed ejection fraction of 25 to 30% although it was poor quality. There is no significant valvular dysfunction. He underwent cardiac catheterization today showing severe three-vessel coronary disease. Results of his right heart catheterization arenot in the cath note although says that there was mild pulmonary hypertension and normal LVEDP.  The patient's wife is with him today. He denies any history of chest pain or pressure. He said that he stays active working in his yard. He has occasional episodes of exertional shortness of breath but stops to rest and it goes away and he resumes his activity. He feels like  his energy level has been good.   Hospital Course:   On 05/27/2019 Mr. Wente underwent a CABG x 3 with Dr. Cyndia Bent.  He was transferred to the surgical ICU for continued care.  He was  extubated in a timely manner.  Heart failure was consulted to assist with milrinone titration.  Intra-Op estimated ejection fraction was 45 to 50%.  Postop day 1 we were able to wean him off of his epinephrine.  We continued milrinone for now.  We gave some albumin and Lasix to stimulate diuresis.  We transitioned his insulin drip to Levemir and sliding scale insulin.  We discontinued his chest tubes.  We initiated Lovenox therapy for DVT prophylaxis.  Postop day 2 his co-ox was 56% therefore we able to decrease milrinone to 0.125.  He was given a dose of IV Lasix.  We continue to encourage use of his incentive spirometer.  Postop day 3 his only complaint was regarding his pain in his buttocks from sitting in the chair.  We continue diuresis due to fluid overload.  His co-ox increased to 67%, therefore we discontinued milrinone at this time.  We will continue to encourage ambulation and incentive spirometer use.  His Levemir dose was decreased due to hypoglycemia.  Postop day 4 he was off all of his drips.  He felt like he was recovering well.  His kidney function remained stable.  His Lasix was discontinued but he remained on spironolactone.  He was stable at this time to transfer to the stepdown unit for continued care.  On the stepdown unit, his two-view chest x-ray showed Decreased left basilar and right upper lobe atelectasis and interval minimal right basilar atelectasis.  Today, he is ambulating with limited assistance, tolerating room air, his incisions are healing well, and he is ready for discharge.  05/07/19 Patient is here today for follow-up.  He feels extremely weak and tired after his open heart surgery which is to be expected.  The sternal incision appears to be healing well with no evidence of secondary cellulitis.  He does have a large hematoma on his right flank and just above his right gluteus.  However this appears to be fading.  He is tender to palpation in that area.  He did have a  retroperitoneal hematoma after his catheterization.  His hemoglobin at discharge was 10.  He is due to recheck that today.  I discussed his case with his cardiologist who recommended starting him on Jardiance for his diabetes given the cardiovascular protective effects which certainly is reasonable.  Previously the patient had Candida balanitis and also dizziness associated with Jardiance however he understands the positive implications of this medication and he is willing to try it again.  He would like clotrimazole cream to have on hand in case he develops a rash on the glans penis.  He does report polyuria ever since starting the medication.  We also discontinued his pioglitazone given the reduced ejection fraction that the patient had in the hospital and the pulmonary edema.  I felt that medication was too dangerous for him to be taking now.  He denies any shortness of breath.  He denies any cough.  He denies any fever.  He denies any dysuria.  He is ambulating using a walker for balance.  He denies any leg swelling or evidence of a DVT.  At that time, my plan was: Switch the patient to [facility] and discontinue pioglitazone given his congestive heart failure and recent non-ST elevation myocardial infarction.  I believe the long-term cardiovascular protective effects outweigh any potential risk.  I did give the patient a prescription for Clotrimazole cream to use in case he develops Candida balanitis.  Recheck a CBC to monitor his anemia.  Recommended a multivitamin with iron to help regenerate the blood loss he experienced in the hospital.  Patient's blood pressure today is well controlled.  Ideally I like the patient to be on an angiotensin receptor blocker or an ACE inhibitor.  However he feels extremely weak and dizzy right now so I will not start that at this time.  I will recheck the patient in a month and if his strength is improving and he is tolerating the Jardiance we may try a low-dose ACE inhibitor  at that time.  Patient received his flu shot today.  Also recommended discontinuation of testosterone given the potential increased risk of cardiovascular events on this medication.  06/07/19 Patient looks remarkable today.  He is doing very well.  He is tolerating the Jardiance without any complications however he has been applying nystatin to his glans on a daily basis.  I have discouraged that.  Instead I have recommended that he keep that area dry.  He can use of desiccant powder such as [employer].  I recommended only using nystatin if there is an active infection.  This way we avoid antibiotic resistance in the yeast.  He denies any chest pain.  His surgical scar on sternum is healing well.  He does have a small seroma on his medial right calf that appears today where they took the vein.  However there is no erythema or warmth in that area.  This seems to be slowly improving.  The large hematoma on his back has completely resolved.  He denies any hematuria or hematochezia or melena.  He denies any dyspnea on exertion.  He denies any pleurisy.  He denies any angina.  Blood pressures well controlled today.  My last visit I made mention of starting an ACE inhibitor however he is on Benicar and therefore this is not necessary.  Therefore he is already on a beta-blocker as well as an angiotensin receptor blocker plus Jardiance.  His preoperative ejection fraction was 25 to 30% however his intraoperative TEE showed an ejection fraction up to 50%.  Therefore Delene Loll is likely not necessary with beneficial.  Therefore he is on optimal medical therapy at this time for his congestive heart failure.  At that time, my plan was: Repeat the CMP today to monitor the patient's calcium.  Calcium is persistently elevated we may need to check his parathyroid hormone status.  His hemoglobin at his last visit was greater than 12 and therefore I will not recheck a CBC today.  His blood pressure is outstanding.  He is reporting  fasting blood sugars between 100-120 and his 2-hour postprandial sugars are under 150.  Therefore his diabetes seems well controlled.  I have recommended returning in 3 months to recheck an A1c and a fasting lipid panel.  Otherwise continue his current medical therapy.  Patient failed to get his flu shot at his last visit so he got it today.  08/09/19 Since his hospitalization this summer, the patient has lost substantial weight.  As result his blood sugars have started to drop.  Recently he has been experiencing episodes of hypoglycemia where he feels dizzy and lightheaded and jittery.  He is also been having episodes of hypotension with systolic blood pressure approaching 100.  At this point, the patient  feels lightheaded and dizzy.  He states the highest his blood pressure has been has been in the 096 systolic.  He denies any chest pain shortness of breath or dyspnea on exertion.  His last A1c was checked in August and is due again however he states the majority of his blood sugars are under 130.  He denies any polyuria polydipsia or blurry vision.  At that time, my plan was: I believe this patient is having episodes of hypotension.  I believe partly this is due to his lifestyle changes and weight loss coupled with the carvedilol, the Benicar, and the Jardiance.  I have recommended that he reduce his dose of Benicar from 20 mg a day to 10 mg a day and less monitor his blood pressure closely and see if his blood pressure stabilizes.  I explained the importance of the medication and helping his ejection fraction recover.  Therefore I would like to keep him on this medication if at all possible.  Patient understands and will monitor his blood pressure.  I have explained to the patient that his blood pressure is not dangerous as long as his systolic blood pressures greater than 100 unless he is having symptoms of hypotension.  I also believe he is having episodes of hypoglycemia brought on by most likely  glipizide and his weight loss.  Therefore I have recommended that the patient discontinue glipizide.  As an aside, the patient states that he feels anxious all the time.  He states that he constantly feels jittery and at times the anxiety gets out of control.  He has not been taking his Celexa.  I have encouraged the patient to start taking Celexa 10 mg a day and allow 3 to 4 weeks for the medication to take effect.  If at that time the anxiety is still a problem we could increase the Celexa to 20 mg however I would not go beyond 20 mg due to his cardiac history and age.  Patient is comfortable with this plan.  More than 25 minutes was spent with the patient in discussion.  03/05/20 Patient has not had a repeat echocardiogram since August of last year.  He denies any chest pain.  He denies any shortness of breath.  He denies any dyspnea on exertion.  However he states that due to Covid he has been unable to see his cardiologist.  Patient had an ejection fraction of 25 to 30% prior to his bypass.  He denies any orthopnea or paroxysmal nocturnal dyspnea.  He also has a history of type 2 diabetes.  He denies any polyuria, polydipsia, or blurry vision.  He denies any hypoglycemic episodes.  He is due to recheck his fasting lab work.  He is also overdue to check for prostate cancer with a PSA.  Today he specifically would like to be evaluated for pneumonia.  He denies any cough or chest pain or pleurisy or hemoptysis or purulent sputum.  Patient states that he just wants to be checked.  He also had a diabetic foot exam performed which shows diminished sensation to 10 g monofilament in both feet.  He has normal dorsalis pedis and posterior tibialis pulses but evidence of peripheral neuropathy due to his diminished sensation.   Past Medical History:  Diagnosis Date  . Allergic rhinitis, cause unspecified   . Allergy   . Anxiety   . Arthritis   . Cancer ([place])   . Cataract   . Chronic systolic CHF (congestive  heart failure) (  [place])   . Coronary atherosclerosis of unspecified type of vessel, native or graft    a. MI 1985 with unclear details. b. CABG 03/2019 with LIMA -> LAD, SVG -> OM, SVG to dRCA.  . Depression    PTSD   . Diverticulosis of colon (without mention of hemorrhage)   . Esophageal reflux   . Esophageal stricture   . Essential hypertension   . Former tobacco use   . Hemorrhoids   . Hyperlipidemia   . Irritable bowel syndrome   . Ischemic cardiomyopathy   . Mild carotid artery disease (HCC)    a. 1-39% bilaterally 03/2019 duplex.  . Myocardial infarction ([place])   . Nocturia   . Obesity, unspecified   . Orthostasis   . Other acquired absence of organ   . Personal history of colonic polyps   . Psychosexual dysfunction with inhibited sexual excitement   . Retroperitoneal bleed   . Type II or unspecified type diabetes mellitus without mention of complication, not stated as uncontrolled    Past Surgical History:  Procedure Laterality Date  . ADENOIDECTOMY    . CARPAL TUNNEL RELEASE     left   . CHOLECYSTECTOMY    . COLON RESECTION     18 inches  . COLON SURGERY  2013  . CORONARY ANGIOPLASTY     1985   . CORONARY ARTERY BYPASS GRAFT N/A 04/26/2019   Procedure: CORONARY ARTERY BYPASS GRAFTING (CABG) x Three, using left internal mammary artery and right leg greater saphenous vein harvested endoscopically;  Surgeon: Gaye Pollack, MD;  Location: [facility];  Service: Open Heart Surgery;  Laterality: N/A;  . ELBOW BURSA SURGERY    . EYE SURGERY    . POLYPECTOMY    . RIGHT/LEFT HEART CATH AND CORONARY ANGIOGRAPHY N/A 04/16/2019   Procedure: RIGHT/LEFT HEART CATH AND CORONARY ANGIOGRAPHY;  Surgeon: Belva Crome, MD;  Location: [facility];  Service: Cardiovascular;  Laterality: N/A;  . SHOULDER SURGERY     Rt. Shoulder  . TEE WITHOUT CARDIOVERSION N/A 04/26/2019   Procedure: TRANSESOPHAGEAL ECHOCARDIOGRAM (TEE);  Surgeon: Gaye Pollack, MD;  Location: [facility];  Service: Open  Heart Surgery;  Laterality: N/A;  . TONSILLECTOMY     Current Outpatient Medications on File Prior to Visit  Medication Sig Dispense Refill  . acetaminophen (TYLENOL) 500 MG tablet Take 1,000 mg by mouth at bedtime as needed for mild pain.    . Ascorbic Acid (VITAMIN C) 100 MG tablet Take 100 mg by mouth daily.    Marland Kitchen aspirin EC 81 MG tablet Take 1 tablet (81 mg total) by mouth daily. 90 tablet 3  . calcium-vitamin D (OSCAL WITH D) 500-200 MG-UNIT tablet Take 1 tablet by mouth.    . carvedilol (COREG) 3.125 MG tablet Take 1 tablet (3.125 mg total) by mouth 2 (two) times daily with a meal. 180 tablet 3  . clopidogrel (PLAVIX) 75 MG tablet Take 1 tablet (75 mg total) by mouth daily. 90 tablet 3  . colestipol (COLESTID) 5 g packet Take 5 g by mouth daily.     . empagliflozin (JARDIANCE) 25 MG TABS tablet Take 25 mg by mouth daily before breakfast. 90 tablet 3  . fexofenadine (ALLEGRA) 180 MG tablet Take 1 tablet (180 mg total) by mouth daily as needed. For allergies 90 tablet 0  . fluticasone (FLONASE) 50 MCG/ACT nasal spray SPRAY 2 SPRAYS INTO EACH NOSTRIL EVERY DAY 48 mL 2  . loteprednol (LOTEMAX) 0.5 % ophthalmic  suspension Place 1 drop into both eyes 4 (four) times daily. 5 mL 0  . nystatin cream (MYCOSTATIN) Apply 1 application topically 3 (three) times daily. 60 g 0  . pantoprazole (PROTONIX) 40 MG tablet TAKE 1 TABLET (40 MG TOTAL) BY MOUTH EVERY MORNING. 90 tablet 3  . rosuvastatin (CRESTOR) 40 MG tablet Take 1 tablet (40 mg total) by mouth daily at 6 PM. 90 tablet 3  . vitamin B-12 (CYANOCOBALAMIN) 500 MCG tablet Take 500 mcg by mouth daily.     No current facility-administered medications on file prior to visit.   Allergies  Allergen Reactions  . Doxazosin Mesylate     UNKNOWN REACTION  . Flomax [Tamsulosin Hcl] Itching       . Penicillins Rash and Other (See Comments)    Has patient had a PCN reaction causing immediate rash, facial/tongue/throat swelling, SOB or lightheadedness  with hypotension: No Has patient had a PCN reaction causing severe rash involving mucus membranes or skin necrosis: No Has patient had a PCN reaction that required hospitalization No Has patient had a PCN reaction occurring within the last 10 years: No If all of the above answers are "NO", then may proceed with Cephalosporin use.   . Sulfonamide Derivatives Rash  . Trulicity [Dulaglutide] Nausea And Vomiting    N&V, Dizziness   Social History   Socioeconomic History  . Marital status: Married    Spouse name: Not on file  . Number of children: 1  . Years of education: 73  . Highest education level: High school graduate  Occupational History  . 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 Smoking status: Former Smoker    Quit date: 08/30/1983    Years since quitting: 36.5  . Smokeless tobacco: Former Systems developer  . Tobacco comment: Quit 30 years ago.  Vaping Use  . Vaping Use: Never used  Substance and Sexual Activity  . Alcohol use: Yes    Comment: rare  . Drug use: No  . Sexual activity: Yes  Other Topics Concern  . Not on file  Social History Narrative  . Not on file   Social Determinants of Health   Financial Resource Strain: Low Risk   . Difficulty of Paying Living Expenses: Not hard at all  Food Insecurity: No Food Insecurity  . Worried About Charity fundraiser in the Last Year: Never true  . Ran Out of Food in the Last Year: Never true  Transportation Needs: No Transportation Needs  . Lack of Transportation (Medical): No  . Lack of Transportation (Non-Medical): No  Physical Activity: Inactive  . Days of Exercise per Week: 0 days  . Minutes of Exercise per Session: 0 min  Stress: No Stress Concern Present  . Feeling of Stress : Only a little  Social Connections:   . Frequency of Communication with Friends and Family:   . Frequency of Social Gatherings with Friends and Family:   . Attends Religious Services:   . Active Member of Clubs or Organizations:   . Attends Theatre manager Meetings:   Marland Kitchen Marital Status:   Intimate Partner Violence:   . Fear of Current or Ex-Partner:   . Emotionally Abused:   Marland Kitchen Physically Abused:   . Sexually Abused:      Review of Systems  All other systems reviewed and are negative.      Objective:   Physical Exam Vitals reviewed.  Constitutional:      General: He is not in acute distress.  Appearance: He is obese. He is not ill-appearing or toxic-appearing.  Cardiovascular:     Rate and Rhythm: Normal rate and regular rhythm.     Pulses: Normal pulses.     Heart sounds: Normal heart sounds. No murmur heard.  No friction rub. No gallop.   Pulmonary:     Effort: Pulmonary effort is normal. No respiratory distress.     Breath sounds: Normal breath sounds. No stridor. No wheezing, rhonchi or rales.  Chest:     Chest wall: No tenderness.  Abdominal:     General: Abdomen is flat. Bowel sounds are normal. There is no distension.     Palpations: Abdomen is soft. There is no mass.     Tenderness: There is no abdominal tenderness. There is no right CVA tenderness, left CVA tenderness, guarding or rebound.     Hernia: No hernia is present.  Musculoskeletal:     Right lower leg: No edema.     Left lower leg: No edema.  Neurological:     Mental Status: He is alert.           Assessment & Plan:  Controlled type 2 diabetes mellitus with complication, without long-term current use of insulin ([place]) - Plan: Hemoglobin A1c, CBC with Differential/Platelet, COMPLETE METABOLIC PANEL WITH GFR, Lipid panel, Microalbumin, urine, ECHOCARDIOGRAM COMPLETE  S/P CABG x 3 - Plan: Hemoglobin A1c, CBC with Differential/Platelet, COMPLETE METABOLIC PANEL WITH GFR, Lipid panel, Microalbumin, urine, ECHOCARDIOGRAM COMPLETE  Chronic systolic congestive heart failure (HCC) - Plan: Hemoglobin A1c, CBC with Differential/Platelet, COMPLETE METABOLIC PANEL WITH GFR, Lipid panel, Microalbumin, urine, ECHOCARDIOGRAM COMPLETE  Prostate cancer  screening - Plan: PSA  Immunizations up to date - Plan: Pneumococcal conjugate vaccine 13-valent IM Patient received a booster on his pneumonia vaccine.  I have asked him to return fasting for a PSA.  I would like to check a hemoglobin A1c.  Goal hemoglobin A1c is less than 6.5.  I would like to check a fasting lipid panel.  Goal LDL cholesterol is less than 70.  I would like to check a urine microalbumin.  Schedule the patient for an echocardiogram to follow-up on his congestive heart failure.  He is currently on Coreg and Jardiance.  He is not on an ACE inhibitor or an ARB or Entresto.  If his ejection fraction remains suppressed I would recommend starting him on either an ARB or Entresto.

## 2020-03-11 ENCOUNTER — Other Ambulatory Visit: Payer: Medicare HMO

## 2020-03-11 ENCOUNTER — Other Ambulatory Visit: Payer: Self-pay

## 2020-03-11 DIAGNOSIS — I5022 Chronic systolic (congestive) heart failure: Secondary | ICD-10-CM | POA: Diagnosis not present

## 2020-03-11 DIAGNOSIS — Z951 Presence of aortocoronary bypass graft: Secondary | ICD-10-CM | POA: Diagnosis not present

## 2020-03-11 DIAGNOSIS — Z125 Encounter for screening for malignant neoplasm of prostate: Secondary | ICD-10-CM | POA: Diagnosis not present

## 2020-03-11 DIAGNOSIS — E118 Type 2 diabetes mellitus with unspecified complications: Secondary | ICD-10-CM | POA: Diagnosis not present

## 2020-03-12 LAB — COMPLETE METABOLIC PANEL WITH GFR
AG Ratio: 2.4 (calc) (ref 1.0–2.5)
ALT: 35 U/L (ref 9–46)
AST: 31 U/L (ref 10–35)
Albumin: 4.3 g/dL (ref 3.6–5.1)
Alkaline phosphatase (APISO): 66 U/L (ref 35–144)
BUN: 13 mg/dL (ref 7–25)
CO2: 29 mmol/L (ref 20–32)
Calcium: 10.1 mg/dL (ref 8.6–10.3)
Chloride: 102 mmol/L (ref 98–110)
Creat: 0.9 mg/dL (ref 0.70–1.18)
GFR, Est African American: 98 mL/min/{1.73_m2} (ref >=60)
GFR, Est Non African American: 84 mL/min/{1.73_m2} (ref >=60)
Globulin: 1.8 g/dL (calc) — ABNORMAL LOW (ref 1.9–3.7)
Glucose, Bld: 134 mg/dL — ABNORMAL HIGH (ref 65–99)
Potassium: 4.6 mmol/L (ref 3.5–5.3)
Sodium: 140 mmol/L (ref 135–146)
Total Bilirubin: 0.7 mg/dL (ref 0.2–1.2)
Total Protein: 6.1 g/dL (ref 6.1–8.1)

## 2020-03-12 LAB — LIPID PANEL
Cholesterol: 109 mg/dL (ref <200)
HDL: 49 mg/dL (ref >=40)
LDL Cholesterol (Calc): 28 mg/dL (calc)
Non-HDL Cholesterol (Calc): 60 mg/dL (calc) (ref <130)
Total CHOL/HDL Ratio: 2.2 (calc) (ref <5.0)
Triglycerides: 269 mg/dL — ABNORMAL HIGH (ref <150)

## 2020-03-12 LAB — CBC WITH DIFFERENTIAL/PLATELET
Absolute Monocytes: 439 cells/uL (ref 200–950)
Basophils Absolute: 29 cells/uL (ref 0–200)
Basophils Relative: 0.5 %
Eosinophils Absolute: 80 cells/uL (ref 15–500)
Eosinophils Relative: 1.4 %
HCT: 48 % (ref 38.5–50.0)
Hemoglobin: 16.2 g/dL (ref 13.2–17.1)
Lymphs Abs: 1750 cells/uL (ref 850–3900)
MCH: 30.1 pg (ref 27.0–33.0)
MCHC: 33.8 g/dL (ref 32.0–36.0)
MCV: 89.1 fL (ref 80.0–100.0)
MPV: 10.2 fL (ref 7.5–12.5)
Monocytes Relative: 7.7 %
Neutro Abs: 3403 cells/uL (ref 1500–7800)
Neutrophils Relative %: 59.7 %
Platelets: 151 10*3/uL (ref 140–400)
RBC: 5.39 10*6/uL (ref 4.20–5.80)
RDW: 14.3 % (ref 11.0–15.0)
Total Lymphocyte: 30.7 %
WBC: 5.7 10*3/uL (ref 3.8–10.8)

## 2020-03-12 LAB — PSA: PSA: 0.3 ng/mL (ref <=4.0)

## 2020-03-12 LAB — HEMOGLOBIN A1C
Hgb A1c MFr Bld: 7.3 % of total Hgb — ABNORMAL HIGH (ref <5.7)
Mean Plasma Glucose: 163 (calc)
eAG (mmol/L): 9 (calc)

## 2020-03-12 LAB — MICROALBUMIN, URINE: Microalb, Ur: 0.2 mg/dL

## 2020-03-13 ENCOUNTER — Telehealth: Payer: Self-pay | Admitting: Family Medicine

## 2020-03-13 ENCOUNTER — Other Ambulatory Visit: Payer: Self-pay

## 2020-03-13 MED ORDER — METFORMIN HCL 1000 MG PO TABS: 1000.0000 mg | ORAL_TABLET | Freq: Two times a day (BID) | ORAL | 3 refills | Status: DC

## 2020-03-13 NOTE — Telephone Encounter (Signed)
Pt called back and decided to go on Metformin extended  release, that rx was sent into her pharmacy

## 2020-03-13 NOTE — Telephone Encounter (Signed)
CB# [phone]  Pt was told to call  Dr.Pickard  he would like to go ahead with the Metformin  Call in the pharmacy today his going to pick up the rest of his medication -

## 2020-03-16 ENCOUNTER — Other Ambulatory Visit: Payer: Self-pay | Admitting: Family Medicine

## 2020-03-16 ENCOUNTER — Other Ambulatory Visit: Payer: Self-pay | Admitting: *Deleted

## 2020-03-16 MED ORDER — METFORMIN HCL ER (MOD) 1000 MG PO TB24: 1000.0000 mg | ORAL_TABLET | Freq: Every day | ORAL | 3 refills | Status: DC

## 2020-03-16 MED ORDER — METFORMIN HCL ER 750 MG PO TB24: 750.0000 mg | ORAL_TABLET | Freq: Every day | ORAL | 3 refills | Status: DC

## 2020-03-17 ENCOUNTER — Other Ambulatory Visit: Payer: Self-pay

## 2020-03-17 ENCOUNTER — Ambulatory Visit (HOSPITAL_COMMUNITY)
Admission: RE | Admit: 2020-03-17 | Discharge: 2020-03-17 | Disposition: A | Discharge status: Home / Self Care | Payer: Medicare HMO | Source: Ambulatory Visit | Attending: Internal Medicine | Admitting: Internal Medicine

## 2020-03-17 DIAGNOSIS — I5022 Chronic systolic (congestive) heart failure: Secondary | ICD-10-CM | POA: Diagnosis not present

## 2020-03-17 DIAGNOSIS — K219 Gastro-esophageal reflux disease without esophagitis: Secondary | ICD-10-CM | POA: Diagnosis not present

## 2020-03-17 DIAGNOSIS — I252 Old myocardial infarction: Secondary | ICD-10-CM | POA: Insufficient documentation

## 2020-03-17 DIAGNOSIS — E785 Hyperlipidemia, unspecified: Secondary | ICD-10-CM | POA: Diagnosis not present

## 2020-03-17 DIAGNOSIS — Z87891 Personal history of nicotine dependence: Secondary | ICD-10-CM | POA: Insufficient documentation

## 2020-03-17 DIAGNOSIS — Z951 Presence of aortocoronary bypass graft: Secondary | ICD-10-CM

## 2020-03-17 DIAGNOSIS — E118 Type 2 diabetes mellitus with unspecified complications: Secondary | ICD-10-CM | POA: Insufficient documentation

## 2020-03-17 DIAGNOSIS — I110 Hypertensive heart disease with heart failure: Secondary | ICD-10-CM | POA: Diagnosis not present

## 2020-03-17 LAB — ECHOCARDIOGRAM COMPLETE
AR max vel: 1.48 cm2
AV Area VTI: 1.45 cm2
AV Area mean vel: 1.4 cm2
AV Mean grad: 4 mmHg
AV Peak grad: 7.5 mmHg
Ao pk vel: 1.37 m/s
Area-P 1/2: 4.31 cm2
S' Lateral: 3.9 cm

## 2020-03-17 NOTE — Progress Notes (Signed)
  Echocardiogram 2D Echocardiogram has been performed.  Cameron Daniel 03/17/2020, 2:56 PM

## 2020-03-23 ENCOUNTER — Telehealth: Payer: Self-pay | Admitting: Family Medicine

## 2020-03-23 NOTE — Telephone Encounter (Signed)
CB# [phone] Call for [place] results from 03-18-20

## 2020-03-24 DIAGNOSIS — E119 Type 2 diabetes mellitus without complications: Secondary | ICD-10-CM | POA: Diagnosis not present

## 2020-03-24 NOTE — Telephone Encounter (Signed)
Spoke with Pt about his results from his echocardiogram, Pt has appt 03/26/20 to discuss results with his provider.

## 2020-03-26 ENCOUNTER — Other Ambulatory Visit: Payer: Self-pay

## 2020-03-26 ENCOUNTER — Ambulatory Visit (INDEPENDENT_AMBULATORY_CARE_PROVIDER_SITE_OTHER): Payer: Medicare HMO | Admitting: Family Medicine

## 2020-03-26 VITALS — BP 116/70 | HR 80 | Temp 98.0°F | Ht 71.0 in | Wt 224.0 lb

## 2020-03-26 DIAGNOSIS — I5022 Chronic systolic (congestive) heart failure: Secondary | ICD-10-CM

## 2020-03-26 DIAGNOSIS — E118 Type 2 diabetes mellitus with unspecified complications: Secondary | ICD-10-CM | POA: Diagnosis not present

## 2020-03-26 DIAGNOSIS — Z951 Presence of aortocoronary bypass graft: Secondary | ICD-10-CM | POA: Diagnosis not present

## 2020-03-26 NOTE — Progress Notes (Signed)
Subjective:    Patient ID: Cameron Daniel, male    DOB: [birth year], 74 y.o.   MRN: [id number]  HPI  Unfortunately, patient was recently admitted to the hospital and was found to have severe three-vessel coronary artery disease as well as congestive heart failure.  I have copied relevant portions of the discharge summary below for my reference:  Admit date: 04/12/2019 Discharge date: 05/02/2019  Admission Diagnoses:      Patient Active Problem List   Diagnosis Date Noted  . Hyponatremia 04/20/2019  . Retroperitoneal hematoma 04/18/2019  . Acute on chronic blood loss anemia 04/18/2019  . Acute systolic HF (heart failure) ([place]) 04/15/2019  . Non-ST elevation (NSTEMI) myocardial infarction ([place]) 04/15/2019  . AKI (acute kidney injury) ([place])   . Community acquired pneumonia of right lower lobe of lung ([place]) 04/12/2019  . Acute respiratory failure ([place]) 04/12/2019  . Allergic conjunctivitis 01/23/2018  . Chronic sinusitis 01/23/2018  . Gout 04/06/2012  . DIVERTICULITIS-COLON 06/18/2010  . FACIAL FLUSHING 12/11/2007  . NOCTURIA 12/11/2007  . Type 2 diabetes mellitus with hyperlipidemia ([place]) 09/07/2007  . OTHER MALAISE AND FATIGUE 09/07/2007  . Allergic rhinitis 08/16/2007  . HYPERLIPIDEMIA 01/24/2007  . OBESITY 01/24/2007  . ERECTILE DYSFUNCTION 01/24/2007  . HYPERTENSION 01/24/2007  . Coronary atherosclerosis 01/24/2007  . GERD 01/24/2007  . DIVERTICULOSIS, COLON 01/24/2007  . IRRITABLE BOWEL SYNDROME 01/24/2007  . COLONIC POLYPS, HX OF 01/24/2007  . CHOLECYSTECTOMY, HX OF 01/24/2007    Discharge Diagnoses:  Principal Problem:   Non-ST elevation (NSTEMI) myocardial infarction [facility]) Active Problems:   Type 2 diabetes mellitus with hyperlipidemia (HCC)   Coronary atherosclerosis   GERD   Community acquired pneumonia of right lower lobe of lung ([place])   Acute respiratory failure (HCC)   AKI (acute kidney injury) ([place])   Acute systolic HF (heart failure) (HCC)    Retroperitoneal hematoma   Acute on chronic blood loss anemia   Hyponatremia   S/P CABG x 3   Discharged Condition: good  HPI:  The patient is a 74 year old gentleman with history of hypertension, diabetes, hyperlipidemia, coronary artery disease status post angioplasty in 1985 who was seen in the emergency room on 04/12/2019 for possible dehydration after working outside in the heat. His wife said they spent about 7 hours in the ER was given intravenous fluids and sent home. He returned with chills, fatigue, cough, and dyspnea and a CT scan of the chest showed right lower lobe pneumonia. High-sensitivity troponin was mildly elevated at 136 and subsequent values were 150 and 1,963. He developed progressive respiratory failure with hypoxia and tachypnea and required intubation in the emergency room. He was seen by PCCM.He was started on antibiotics. A post intubation chest x-ray showed central pulmonary edema. He was extubated the following day and did well initially and then developed acute respiratory distress requiring increased oxygen and diuresis. He had a stat echocardiogram which showed ejection fraction of 25 to 30% although it was poor quality. There is no significant valvular dysfunction. He underwent cardiac catheterization today showing severe three-vessel coronary disease. Results of his right heart catheterization arenot in the cath note although says that there was mild pulmonary hypertension and normal LVEDP.  The patient's wife is with him today. He denies any history of chest pain or pressure. He said that he stays active working in his yard. He has occasional episodes of exertional shortness of breath but stops to rest and it goes away and he resumes his activity. He feels like  his energy level has been good.   Hospital Course:   On 05/27/2019 Cameron Daniel underwent a CABG x 3 with Dr. Cyndia Bent.  He was transferred to the surgical ICU for continued care.  He was  extubated in a timely manner.  Heart failure was consulted to assist with milrinone titration.  Intra-Op estimated ejection fraction was 45 to 50%.  Postop day 1 we were able to wean him off of his epinephrine.  We continued milrinone for now.  We gave some albumin and Lasix to stimulate diuresis.  We transitioned his insulin drip to Levemir and sliding scale insulin.  We discontinued his chest tubes.  We initiated Lovenox therapy for DVT prophylaxis.  Postop day 2 his co-ox was 56% therefore we able to decrease milrinone to 0.125.  He was given a dose of IV Lasix.  We continue to encourage use of his incentive spirometer.  Postop day 3 his only complaint was regarding his pain in his buttocks from sitting in the chair.  We continue diuresis due to fluid overload.  His co-ox increased to 67%, therefore we discontinued milrinone at this time.  We will continue to encourage ambulation and incentive spirometer use.  His Levemir dose was decreased due to hypoglycemia.  Postop day 4 he was off all of his drips.  He felt like he was recovering well.  His kidney function remained stable.  His Lasix was discontinued but he remained on spironolactone.  He was stable at this time to transfer to the stepdown unit for continued care.  On the stepdown unit, his two-view chest x-ray showed Decreased left basilar and right upper lobe atelectasis and interval minimal right basilar atelectasis.  Today, he is ambulating with limited assistance, tolerating room air, his incisions are healing well, and he is ready for discharge.  05/07/19 Patient is here today for follow-up.  He feels extremely weak and tired after his open heart surgery which is to be expected.  The sternal incision appears to be healing well with no evidence of secondary cellulitis.  He does have a large hematoma on his right flank and just above his right gluteus.  However this appears to be fading.  He is tender to palpation in that area.  He did have a  retroperitoneal hematoma after his catheterization.  His hemoglobin at discharge was 10.  He is due to recheck that today.  I discussed his case with his cardiologist who recommended starting him on Jardiance for his diabetes given the cardiovascular protective effects which certainly is reasonable.  Previously the patient had Candida balanitis and also dizziness associated with Jardiance however he understands the positive implications of this medication and he is willing to try it again.  He would like clotrimazole cream to have on hand in case he develops a rash on the glans penis.  He does report polyuria ever since starting the medication.  We also discontinued his pioglitazone given the reduced ejection fraction that the patient had in the hospital and the pulmonary edema.  I felt that medication was too dangerous for him to be taking now.  He denies any shortness of breath.  He denies any cough.  He denies any fever.  He denies any dysuria.  He is ambulating using a walker for balance.  He denies any leg swelling or evidence of a DVT.  At that time, my plan was: Switch the patient to [facility] and discontinue pioglitazone given his congestive heart failure and recent non-ST elevation myocardial infarction.  I believe the long-term cardiovascular protective effects outweigh any potential risk.  I did give the patient a prescription for Clotrimazole cream to use in case he develops Candida balanitis.  Recheck a CBC to monitor his anemia.  Recommended a multivitamin with iron to help regenerate the blood loss he experienced in the hospital.  Patient's blood pressure today is well controlled.  Ideally I like the patient to be on an angiotensin receptor blocker or an ACE inhibitor.  However he feels extremely weak and dizzy right now so I will not start that at this time.  I will recheck the patient in a month and if his strength is improving and he is tolerating the Jardiance we may try a low-dose ACE inhibitor  at that time.  Patient received his flu shot today.  Also recommended discontinuation of testosterone given the potential increased risk of cardiovascular events on this medication.  06/07/19 Patient looks remarkable today.  He is doing very well.  He is tolerating the Jardiance without any complications however he has been applying nystatin to his glans on a daily basis.  I have discouraged that.  Instead I have recommended that he keep that area dry.  He can use of desiccant powder such as [employer].  I recommended only using nystatin if there is an active infection.  This way we avoid antibiotic resistance in the yeast.  He denies any chest pain.  His surgical scar on sternum is healing well.  He does have a small seroma on his medial right calf that appears today where they took the vein.  However there is no erythema or warmth in that area.  This seems to be slowly improving.  The large hematoma on his back has completely resolved.  He denies any hematuria or hematochezia or melena.  He denies any dyspnea on exertion.  He denies any pleurisy.  He denies any angina.  Blood pressures well controlled today.  My last visit I made mention of starting an ACE inhibitor however he is on Benicar and therefore this is not necessary.  Therefore he is already on a beta-blocker as well as an angiotensin receptor blocker plus Jardiance.  His preoperative ejection fraction was 25 to 30% however his intraoperative TEE showed an ejection fraction up to 50%.  Therefore Delene Loll is likely not necessary with beneficial.  Therefore he is on optimal medical therapy at this time for his congestive heart failure.  At that time, my plan was: Repeat the CMP today to monitor the patient's calcium.  Calcium is persistently elevated we may need to check his parathyroid hormone status.  His hemoglobin at his last visit was greater than 12 and therefore I will not recheck a CBC today.  His blood pressure is outstanding.  He is reporting  fasting blood sugars between 100-120 and his 2-hour postprandial sugars are under 150.  Therefore his diabetes seems well controlled.  I have recommended returning in 3 months to recheck an A1c and a fasting lipid panel.  Otherwise continue his current medical therapy.  Patient failed to get his flu shot at his last visit so he got it today.  08/09/19 Since his hospitalization this summer, the patient has lost substantial weight.  As result his blood sugars have started to drop.  Recently he has been experiencing episodes of hypoglycemia where he feels dizzy and lightheaded and jittery.  He is also been having episodes of hypotension with systolic blood pressure approaching 100.  At this point, the patient  feels lightheaded and dizzy.  He states the highest his blood pressure has been has been in the 751 systolic.  He denies any chest pain shortness of breath or dyspnea on exertion.  His last A1c was checked in August and is due again however he states the majority of his blood sugars are under 130.  He denies any polyuria polydipsia or blurry vision.  At that time, my plan was: I believe this patient is having episodes of hypotension.  I believe partly this is due to his lifestyle changes and weight loss coupled with the carvedilol, the Benicar, and the Jardiance.  I have recommended that he reduce his dose of Benicar from 20 mg a day to 10 mg a day and less monitor his blood pressure closely and see if his blood pressure stabilizes.  I explained the importance of the medication and helping his ejection fraction recover.  Therefore I would like to keep him on this medication if at all possible.  Patient understands and will monitor his blood pressure.  I have explained to the patient that his blood pressure is not dangerous as long as his systolic blood pressures greater than 100 unless he is having symptoms of hypotension.  I also believe he is having episodes of hypoglycemia brought on by most likely  glipizide and his weight loss.  Therefore I have recommended that the patient discontinue glipizide.  As an aside, the patient states that he feels anxious all the time.  He states that he constantly feels jittery and at times the anxiety gets out of control.  He has not been taking his Celexa.  I have encouraged the patient to start taking Celexa 10 mg a day and allow 3 to 4 weeks for the medication to take effect.  If at that time the anxiety is still a problem we could increase the Celexa to 20 mg however I would not go beyond 20 mg due to his cardiac history and age.  Patient is comfortable with this plan.  More than 25 minutes was spent with the patient in discussion.  03/05/20 Patient has not had a repeat echocardiogram since August of last year.  He denies any chest pain.  He denies any shortness of breath.  He denies any dyspnea on exertion.  However he states that due to Covid he has been unable to see his cardiologist.  Patient had an ejection fraction of 25 to 30% prior to his bypass.  He denies any orthopnea or paroxysmal nocturnal dyspnea.  He also has a history of type 2 diabetes.  He denies any polyuria, polydipsia, or blurry vision.  He denies any hypoglycemic episodes.  He is due to recheck his fasting lab work.  He is also overdue to check for prostate cancer with a PSA.  Today he specifically would like to be evaluated for pneumonia.  He denies any cough or chest pain or pleurisy or hemoptysis or purulent sputum.  Patient states that he just wants to be checked.  He also had a diabetic foot exam performed which shows diminished sensation to 10 g monofilament in both feet.  He has normal dorsalis pedis and posterior tibialis pulses but evidence of peripheral neuropathy due to his diminished sensation.  At that time, my plan was: Patient received a booster on his pneumonia vaccine.  I have asked him to return fasting for a PSA.  I would like to check a hemoglobin A1c.  Goal hemoglobin A1c is  less than 6.5.  I would like to check a fasting lipid panel.  Goal LDL cholesterol is less than 70.  I would like to check a urine microalbumin.  Schedule the patient for an echocardiogram to follow-up on his congestive heart failure.  He is currently on Coreg and Jardiance.  He is not on an ACE inhibitor or an ARB or Entresto.  If his ejection fraction remains suppressed I would recommend starting him on either an ARB or Entresto.  03/26/20  Echo- Comparison(s): No significant change from prior study. 04/26/19: TEE, LVEF  45-50%, inferior hypokinesis. The current study demonstrates high LV  filling pressure consistent with congestive heart failure.   Patient is here today to discuss further. Patient works in Biomedical scientist. Is extremely hot outside. He is sweating profusely every day. Some days he gets extremely lightheaded. Most days he comes in around lunchtime and has to take a nap due to the fact he is so tired. He denies any chest pain or shortness of breath or dyspnea on exertion. He is not taking an ACE inhibitor. In the past he took Benicar. He denies any palpitations or tachyarrhythmias. He denies any syncope or near syncope. On his recent lab work his A1c was elevated at 7.3. We have started the patient on Metformin extended release 1000 mg a day in addition to his Jardiance. Thus far he is tolerating the medication without worsening of his diarrhea.   Past Medical History:  Diagnosis Date  . Allergic rhinitis, cause unspecified   . Allergy   . Anxiety   . Arthritis   . Cancer ([place])   . Cataract   . Chronic systolic CHF (congestive heart failure) ([place])   . Coronary atherosclerosis of unspecified type of vessel, native or graft    a. MI 1985 with unclear details. b. CABG 03/2019 with LIMA -> LAD, SVG -> OM, SVG to dRCA.  . Depression    PTSD   . Diverticulosis of colon (without mention of hemorrhage)   . Esophageal reflux   . Esophageal stricture   . Essential hypertension   . Former  tobacco use   . Hemorrhoids   . Hyperlipidemia   . Irritable bowel syndrome   . Ischemic cardiomyopathy   . Mild carotid artery disease (HCC)    a. 1-39% bilaterally 03/2019 duplex.  . Myocardial infarction ([place])   . Nocturia   . Obesity, unspecified   . Orthostasis   . Other acquired absence of organ   . Personal history of colonic polyps   . Psychosexual dysfunction with inhibited sexual excitement   . Retroperitoneal bleed   . Type II or unspecified type diabetes mellitus without mention of complication, not stated as uncontrolled    Past Surgical History:  Procedure Laterality Date  . ADENOIDECTOMY    . CARPAL TUNNEL RELEASE     left   . CHOLECYSTECTOMY    . COLON RESECTION     18 inches  . COLON SURGERY  2013  . CORONARY ANGIOPLASTY     1985   . CORONARY ARTERY BYPASS GRAFT N/A 04/26/2019   Procedure: CORONARY ARTERY BYPASS GRAFTING (CABG) x Three, using left internal mammary artery and right leg greater saphenous vein harvested endoscopically;  Surgeon: Gaye Pollack, MD;  Location: [place] [facility];  Service: Open Heart Surgery;  Laterality: N/A;  . ELBOW BURSA SURGERY    . EYE SURGERY    . POLYPECTOMY    . RIGHT/LEFT HEART CATH AND CORONARY ANGIOGRAPHY N/A 04/16/2019   Procedure: RIGHT/LEFT HEART  CATH AND CORONARY ANGIOGRAPHY;  Surgeon: Belva Crome, MD;  Location: [place] [facility];  Service: Cardiovascular;  Laterality: N/A;  . SHOULDER SURGERY     Rt. Shoulder  . TEE WITHOUT CARDIOVERSION N/A 04/26/2019   Procedure: TRANSESOPHAGEAL ECHOCARDIOGRAM (TEE);  Surgeon: Gaye Pollack, MD;  Location: [place];  Service: Open Heart Surgery;  Laterality: N/A;  . TONSILLECTOMY     Current Outpatient Medications on File Prior to Visit  Medication Sig Dispense Refill  . acetaminophen (TYLENOL) 500 MG tablet Take 1,000 mg by mouth at bedtime as needed for mild pain.    . Ascorbic Acid (VITAMIN C) 100 MG tablet Take 100 mg by mouth daily.    Marland Kitchen aspirin EC 81 MG tablet Take 1 tablet (81  mg total) by mouth daily. 90 tablet 3  . calcium-vitamin D (OSCAL WITH D) 500-200 MG-UNIT tablet Take 1 tablet by mouth.    . carvedilol (COREG) 3.125 MG tablet Take 1 tablet (3.125 mg total) by mouth 2 (two) times daily with a meal. 180 tablet 3  . citalopram (CELEXA) 10 MG tablet Take 1 tablet (10 mg total) by mouth daily. 90 tablet 2  . clopidogrel (PLAVIX) 75 MG tablet Take 1 tablet (75 mg total) by mouth daily. 90 tablet 3  . colestipol (COLESTID) 5 g packet Take 5 g by mouth daily.     . empagliflozin (JARDIANCE) 25 MG TABS tablet Take 25 mg by mouth daily before breakfast. 90 tablet 3  . fexofenadine (ALLEGRA) 180 MG tablet Take 1 tablet (180 mg total) by mouth daily as needed. For allergies 90 tablet 0  . finasteride (PROSCAR) 5 MG tablet Take 1 tablet (5 mg total) by mouth daily. 90 tablet 3  . fluticasone (FLONASE) 50 MCG/ACT nasal spray SPRAY 2 SPRAYS INTO EACH NOSTRIL EVERY DAY 48 mL 2  . loteprednol (LOTEMAX) 0.5 % ophthalmic suspension Place 1 drop into both eyes 4 (four) times daily. 5 mL 0  . metFORMIN (GLUCOPHAGE XR) 750 MG 24 hr tablet Take 1 tablet (750 mg total) by mouth daily with breakfast. 90 tablet 3  . nystatin cream (MYCOSTATIN) Apply 1 application topically 3 (three) times daily. 60 g 0  . pantoprazole (PROTONIX) 40 MG tablet TAKE 1 TABLET (40 MG TOTAL) BY MOUTH EVERY MORNING. 90 tablet 3  . rosuvastatin (CRESTOR) 40 MG tablet Take 1 tablet (40 mg total) by mouth daily at 6 PM. 90 tablet 3  . vitamin B-12 (CYANOCOBALAMIN) 500 MCG tablet Take 500 mcg by mouth daily.     No current facility-administered medications on file prior to visit.   Allergies  Allergen Reactions  . Doxazosin Mesylate     UNKNOWN REACTION  . Flomax [Tamsulosin Hcl] Itching       . Penicillins Rash and Other (See Comments)    Has patient had a PCN reaction causing immediate rash, facial/tongue/throat swelling, SOB or lightheadedness with hypotension: No Has patient had a PCN reaction causing  severe rash involving mucus membranes or skin necrosis: No Has patient had a PCN reaction that required hospitalization No Has patient had a PCN reaction occurring within the last 10 years: No If all of the above answers are "NO", then may proceed with Cephalosporin use.   . Sulfonamide Derivatives Rash  . Trulicity [Dulaglutide] Nausea And Vomiting    N&V, Dizziness   Social History   Socioeconomic History  . Marital status: Married    Spouse name: Not on file  . Number of children:  1  . Years of education: 110  . Highest education level: High school graduate  Occupational History  . 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 Smoking status: Former Smoker    Quit date: 08/30/1983    Years since quitting: 36.5  . Smokeless tobacco: Former Systems developer  . Tobacco comment: Quit 30 years ago.  Vaping Use  . Vaping Use: Never used  Substance and Sexual Activity  . Alcohol use: Yes    Comment: rare  . Drug use: No  . Sexual activity: Yes  Other Topics Concern  . Not on file  Social History Narrative  . Not on file   Social Determinants of Health   Financial Resource Strain: Low Risk   . Difficulty of Paying Living Expenses: Not hard at all  Food Insecurity: No Food Insecurity  . Worried About Charity fundraiser in the Last Year: Never true  . Ran Out of Food in the Last Year: Never true  Transportation Needs: No Transportation Needs  . Lack of Transportation (Medical): No  . Lack of Transportation (Non-Medical): No  Physical Activity: Inactive  . Days of Exercise per Week: 0 days  . Minutes of Exercise per Session: 0 min  Stress: No Stress Concern Present  . Feeling of Stress : Only a little  Social Connections:   . Frequency of Communication with Friends and Family:   . Frequency of Social Gatherings with Friends and Family:   . Attends Religious Services:   . Active Member of Clubs or Organizations:   . Attends Archivist Meetings:   Marland Kitchen Marital Status:   Intimate Partner  Violence:   . Fear of Current or Ex-Partner:   . Emotionally Abused:   Marland Kitchen Physically Abused:   . Sexually Abused:      Review of Systems  All other systems reviewed and are negative.      Objective:   Physical Exam Vitals reviewed.  Constitutional:      General: He is not in acute distress.    Appearance: He is obese. He is not ill-appearing or toxic-appearing.  Cardiovascular:     Rate and Rhythm: Normal rate and regular rhythm.     Pulses: Normal pulses.     Heart sounds: Normal heart sounds. No murmur heard.  No friction rub. No gallop.   Pulmonary:     Effort: Pulmonary effort is normal. No respiratory distress.     Breath sounds: Normal breath sounds. No stridor. No wheezing, rhonchi or rales.  Chest:     Chest wall: No tenderness.  Abdominal:     General: Abdomen is flat. Bowel sounds are normal. There is no distension.     Palpations: Abdomen is soft. There is no mass.     Tenderness: There is no abdominal tenderness. There is no right CVA tenderness, left CVA tenderness, guarding or rebound.     Hernia: No hernia is present.  Musculoskeletal:     Right lower leg: No edema.     Left lower leg: No edema.  Neurological:     Mental Status: He is alert.           Assessment & Plan:   Chronic systolic congestive heart failure (HCC)  S/P CABG x 3  Controlled type 2 diabetes mellitus with complication, without long-term current use of insulin (HCC)  Blood pressure is relatively low at 116/70. Given his frequent episodes of dehydration and lightheadedness, I am concerned that the patient may experience orthostatic hypotension if  we had Benicar or Entresto. We discussed this at length. Patient would like to take the Metformin first and recheck in 3 months his A1c. This would put [place] in October when it is much cooler outside. He would then like to try to add a low-dose of Benicar so that he can start adjusting to the medication when he is not sweating so much and  dealing with dehydration as often. I believe this is appropriate. I did recommend that he schedule his follow-up with his cardiologist as well. Tentatively I will plan to recheck his A1c in October and I would like to start him on Benicar 5 to 10 mg a day if tolerated

## 2020-04-03 DIAGNOSIS — M1711 Unilateral primary osteoarthritis, right knee: Secondary | ICD-10-CM | POA: Diagnosis not present

## 2020-04-10 DIAGNOSIS — M1711 Unilateral primary osteoarthritis, right knee: Secondary | ICD-10-CM | POA: Diagnosis not present

## 2020-04-13 ENCOUNTER — Other Ambulatory Visit: Payer: Self-pay

## 2020-04-13 ENCOUNTER — Encounter: Payer: Self-pay | Admitting: Physician Assistant

## 2020-04-13 ENCOUNTER — Ambulatory Visit: Payer: Medicare HMO | Admitting: Physician Assistant

## 2020-04-13 VITALS — BP 130/60 | HR 87 | Ht 71.0 in | Wt 221.0 lb

## 2020-04-13 DIAGNOSIS — I5022 Chronic systolic (congestive) heart failure: Secondary | ICD-10-CM | POA: Diagnosis not present

## 2020-04-13 DIAGNOSIS — E782 Mixed hyperlipidemia: Secondary | ICD-10-CM | POA: Diagnosis not present

## 2020-04-13 DIAGNOSIS — E785 Hyperlipidemia, unspecified: Secondary | ICD-10-CM | POA: Diagnosis not present

## 2020-04-13 DIAGNOSIS — E1169 Type 2 diabetes mellitus with other specified complication: Secondary | ICD-10-CM | POA: Diagnosis not present

## 2020-04-13 DIAGNOSIS — I6523 Occlusion and stenosis of bilateral carotid arteries: Secondary | ICD-10-CM

## 2020-04-13 DIAGNOSIS — I10 Essential (primary) hypertension: Secondary | ICD-10-CM | POA: Diagnosis not present

## 2020-04-13 DIAGNOSIS — I2510 Atherosclerotic heart disease of native coronary artery without angina pectoris: Secondary | ICD-10-CM

## 2020-04-13 MED ORDER — LOSARTAN POTASSIUM 25 MG PO TABS: 25.0000 mg | ORAL_TABLET | Freq: Every day | ORAL | 1 refills | Status: DC

## 2020-04-13 NOTE — Patient Instructions (Addendum)
Medication Instructions:  Your physician has recommended you make the following change in your medication:   1) Start Losartan 25 mg, 1 tablet by mouth once a day  *If you need a refill on your cardiac medications before your next appointment, please call your pharmacy*  Lab Work: Your physician recommends that you return for lab work in: 2 weeks for BMET  Testing/Procedures: Your physician has requested that you have a carotid duplex. This test is an ultrasound of the carotid arteries in your neck. It looks at blood flow through these arteries that supply the brain with blood. Allow one hour for this exam. There are no restrictions or special instructions.  Follow-Up: At [facility], you and your health needs are our priority.  As part of our continuing mission to provide you with exceptional heart care, we have created designated Provider Care Teams.  These Care Teams include your primary Cardiologist (physician) and Advanced Practice Providers (APPs -  Physician Assistants and Nurse Practitioners) who all work together to provide you with the care you need, when you need it.  Your next appointment:   6 month(s)  The format for your next appointment:   In Person  Provider:   [name], MD

## 2020-04-13 NOTE — Progress Notes (Signed)
Cardiology Office Note:    Date:  04/13/2020   ID:  Gearl, Baratta 20-Feb-1946, MRN [id number]  PCP:  [name], MD  Cardiologist:  [name], MD   Electrophysiologist:  None  Advanced Heart Failure Clinic:  Glori Bickers, MD    Referring MD: [name], MD   Chief Complaint:  Follow-up (CAD, CHF)    Patient Profile:    Cameron Daniel is a 74 y.o. male with:   Coronary artery disease  S/p NSTEMI 03/2019 (large retroperitoneal bleed post cath)>> s/p CABG  Grafts: L-LAD, S-OM, S-dRCA  Systolic CHF   Ischemic CM  EF 03/2019: 25-30 >> Intraop: EF 45-50  Not on spironolactone secondary to hyperkalemia  Echo 7/21: EF 45-50, GRII DD  Hypertension   Diabetes mellitus   Hyperlipidemia   Prior CV studies: Echocardiogram 03/17/2020 EF 45-50, normal wall motion, moderate LVH, GRII DD, mildly reduced RV SF, mild LAE, trivial MR  Carotid US 04/17/2019 Bilateral ICA 1-39  Cardiac catheterization 04/16/2019  Severe three-vessel coronary artery disease involving the ostial proximal and mid LAD, the mid RCA, and the mid and distal circumflex.  There is moderate but not angiographically significant left main disease.  Global hypokinesis with reduced EF less than 30% has been noted by echocardiography.  Overall picture is consistent with ischemic cardiomyopathy, possibly worsened EF with stunned myocardium in the setting of severe three-vessel CAD and increased demand caused by pneumonia and intubation.  Hemodynamics suggest that volume status is stable.  Cardiac output is low due to left ventricular hypocontractility.   History of Present Illness:    Mr. Herrod was last seen in clinic by Melina Copa, PA-C in 08/2019.  He returns for follow-up.  He is here alone.  He has been doing well.  He remains active without chest discomfort, significant shortness of breath, orthopnea, leg swelling or syncope.  He had to stop olmesartan due to  hypotension.  Past Medical History:  Diagnosis Date   Allergic rhinitis, cause unspecified    Allergy    Anxiety    Arthritis    Cancer ([place])    Cataract    Chronic systolic CHF (congestive heart failure) ([place])    Coronary atherosclerosis of unspecified type of vessel, native or graft    a. MI 1985 with unclear details. b. CABG 03/2019 with LIMA -> LAD, SVG -> OM, SVG to dRCA.   Depression    PTSD    Diverticulosis of colon (without mention of hemorrhage)    Esophageal reflux    Esophageal stricture    Essential hypertension    Former tobacco use    Hemorrhoids    Hyperlipidemia    Irritable bowel syndrome    Ischemic cardiomyopathy    Mild carotid artery disease ([place])    a. 1-39% bilaterally 03/2019 duplex.   Myocardial infarction (HCC)    Nocturia    Obesity, unspecified    Orthostasis    Other acquired absence of organ    Personal history of colonic polyps    Psychosexual dysfunction with inhibited sexual excitement    Retroperitoneal bleed    Type II or unspecified type diabetes mellitus without mention of complication, not stated as uncontrolled     Current Medications: Current Meds  Medication Sig   acetaminophen (TYLENOL) 500 MG tablet Take 1,000 mg by mouth at bedtime as needed for mild pain.   Ascorbic Acid (VITAMIN C) 100 MG tablet Take 100 mg by mouth daily.   aspirin EC  81 MG tablet Take 1 tablet (81 mg total) by mouth daily.   calcium-vitamin D (OSCAL WITH D) 500-200 MG-UNIT tablet Take 1 tablet by mouth.   carvedilol (COREG) 3.125 MG tablet Take 1 tablet (3.125 mg total) by mouth 2 (two) times daily with a meal.   citalopram (CELEXA) 10 MG tablet Take 1 tablet (10 mg total) by mouth daily.   clopidogrel (PLAVIX) 75 MG tablet Take 1 tablet (75 mg total) by mouth daily.   colestipol (COLESTID) 5 g packet Take 5 g by mouth daily.    empagliflozin (JARDIANCE) 25 MG TABS tablet Take 25 mg by mouth daily before breakfast.    fexofenadine (ALLEGRA) 180 MG tablet Take 1 tablet (180 mg total) by mouth daily as needed. For allergies   finasteride (PROSCAR) 5 MG tablet Take 1 tablet (5 mg total) by mouth daily.   fluticasone (FLONASE) 50 MCG/ACT nasal spray SPRAY 2 SPRAYS INTO EACH NOSTRIL EVERY DAY   loteprednol (LOTEMAX) 0.5 % ophthalmic suspension Place 1 drop into both eyes 4 (four) times daily.   metFORMIN (GLUCOPHAGE XR) 750 MG 24 hr tablet Take 1 tablet (750 mg total) by mouth daily with breakfast.   nystatin cream (MYCOSTATIN) Apply 1 application topically 3 (three) times daily.   pantoprazole (PROTONIX) 40 MG tablet TAKE 1 TABLET (40 MG TOTAL) BY MOUTH EVERY MORNING.   rosuvastatin (CRESTOR) 40 MG tablet Take 1 tablet (40 mg total) by mouth daily at 6 PM.   vitamin B-12 (CYANOCOBALAMIN) 500 MCG tablet Take 500 mcg by mouth daily.     Allergies:   Doxazosin mesylate, Flomax [tamsulosin hcl], Penicillins, Sulfonamide derivatives, and Trulicity [dulaglutide]   Social History   Tobacco Use   Smoking status: Former Smoker    Quit date: 08/30/1983    Years since quitting: 36.6   Smokeless tobacco: Former Systems developer   Tobacco comment: Quit 30 years ago.  Vaping Use   Vaping Use: Never used  Substance Use Topics   Alcohol use: Yes    Comment: rare   Drug use: No     Family Hx: The patient's family history includes Allergic rhinitis in his father; Coronary artery disease in his mother; Lung cancer in his father. There is no history of Colon cancer, Heart attack, Hypertension, Stroke, Esophageal cancer, Rectal cancer, Stomach cancer, or Pancreatic cancer.  ROS   EKGs/Labs/Other Test Reviewed:    EKG:  EKG is  ordered today.  The ekg ordered today demonstrates normal sinus rhythm, heart rate 87, inferior Q waves, anterolateral Q waves, QTC 500, IVCD-?  Incomplete left bundle branch block  Recent Labs: 04/21/2019: TSH 2.142 04/27/2019: Magnesium 1.7 03/11/2020: ALT 35; BUN 13; Creat 0.90; Hemoglobin  16.2; Platelets 151; Potassium 4.6; Sodium 140   Recent Lipid Panel Lab Results  Component Value Date/Time   CHOL 109 03/11/2020 08:04 AM   TRIG 269 (H) 03/11/2020 08:04 AM   TRIG 193 (H) 08/08/2006 09:24 AM   HDL 49 03/11/2020 08:04 AM   CHOLHDL 2.2 03/11/2020 08:04 AM   LDLCALC 28 03/11/2020 08:04 AM   LDLDIRECT 49.9 08/26/2013 10:11 AM    Physical Exam:    VS:  BP 130/60    Pulse 87    Ht 5\' 11"  (1.803 m)    Wt 221 lb (100.2 kg)    SpO2 96%    BMI 30.82 kg/m     Wt Readings from Last 3 Encounters:  04/13/20 221 lb (100.2 kg)  03/26/20 (!) 224 lb (101.6 kg)  03/05/20  224 lb (101.6 kg)     Constitutional:      Appearance: Healthy appearance. Not in distress.  Neck:     Thyroid: No thyromegaly.     Vascular: No carotid bruit. JVD normal.  Pulmonary:     Effort: Pulmonary effort is normal.     Breath sounds: No wheezing. No rales.  Cardiovascular:     Normal rate. Regular rhythm. Normal S1. Normal S2.     Murmurs: There is no murmur.  Edema:    Peripheral edema absent.  Abdominal:     Palpations: Abdomen is soft. There is no hepatomegaly.  Skin:    General: Skin is warm and dry.  Neurological:     General: No focal deficit present.     Mental Status: Alert and oriented to person, place and time.     Cranial Nerves: Cranial nerves are intact.      ASSESSMENT & PLAN:    1. Coronary artery disease involving native coronary artery of native heart without angina pectoris S/p non-STEMI in 03/2019 followed by CABG.  He is doing well without anginal symptoms.  Continue aspirin, clopidogrel, carvedilol, rosuvastatin.  I will review further with Dr. [name] but we should be able to stop his clopidogrel after the end of August.  Follow-up in 6 months.  2. Chronic systolic heart failure ([place]) EF improved from 25-30% to 45-50% by most recent echocardiogram in July 2021.  Ischemic cardiomyopathy.  NYHA II.  Volume status appears stable.  He had to stop olmesartan secondary to  hypotension.  I have asked him to try losartan 25 mg daily.  If he cannot tolerate this, he will remain on beta-blocker alone.  Continue current dose of carvedilol.  3. Essential hypertension The patient's blood pressure is controlled on his current regimen.    4. Mixed hyperlipidemia Recent LDL optimal.  Triglycerides were elevated.  He notes that his diet was poor at that time.  If he continues to have elevated triglycerides, we could could place him on Vascepa.  5. Type 2 diabetes mellitus with hyperlipidemia ([place]) He remains on empagliflozin.  6. Bilateral carotid artery stenosis Obtain follow-up carotid Dopplers.    Dispo:  Return in about 6 months (around 10/14/2020) for Routine Follow Up w/ Dr. Meda Coffee, in person.   Medication Adjustments/Labs and Tests Ordered: Current medicines are reviewed at length with the patient today.  Concerns regarding medicines are outlined above.  Tests Ordered: Orders Placed This Encounter  Procedures   Basic metabolic panel   EKG [id number]   VAS US CAROTID   Medication Changes: Meds ordered this encounter  Medications   losartan (COZAAR) 25 MG tablet    Sig: Take 1 tablet (25 mg total) by mouth daily.    Dispense:  90 tablet    Refill:  1    Signed, Richardson Dopp, PA-C  04/13/2020 3:48 PM    [facility] [place], [place], [place]  [address] Phone: [phone]; Fax: [phone]

## 2020-04-15 ENCOUNTER — Telehealth: Payer: Self-pay | Admitting: Physician Assistant

## 2020-04-15 NOTE — Telephone Encounter (Signed)
I called and left patient a message to call back. 

## 2020-04-15 NOTE — Telephone Encounter (Signed)
Follow up  ° ° °Pt returning call  °

## 2020-04-15 NOTE — Telephone Encounter (Signed)
Reviewed with Dr. [name]. He does not need to follow up in the [facility]F Clinic. He can continue follow up here as planned. Also, after August 31, he can stop Plavix. Richardson Dopp, PA-C    04/15/2020 8:25 AM

## 2020-04-15 NOTE — Telephone Encounter (Signed)
Pt called back. °

## 2020-04-15 NOTE — Telephone Encounter (Signed)
Patient returned my call, he is aware per Nicki Reaper he does not need to follow up with Dr. [name], appointment on 05/14/20 cancelled with Dr. [name]. Patient will follow up here as planned and stop Plavix after 04/28/20.

## 2020-04-17 DIAGNOSIS — M1711 Unilateral primary osteoarthritis, right knee: Secondary | ICD-10-CM | POA: Diagnosis not present

## 2020-04-20 DIAGNOSIS — E119 Type 2 diabetes mellitus without complications: Secondary | ICD-10-CM | POA: Diagnosis not present

## 2020-04-29 ENCOUNTER — Ambulatory Visit (HOSPITAL_COMMUNITY)
Admission: RE | Admit: 2020-04-29 | Discharge: 2020-04-29 | Disposition: A | Discharge status: Home / Self Care | Payer: Medicare HMO | Source: Ambulatory Visit | Attending: Cardiovascular Disease | Admitting: Cardiovascular Disease

## 2020-04-29 ENCOUNTER — Other Ambulatory Visit: Payer: Self-pay

## 2020-04-29 DIAGNOSIS — I6523 Occlusion and stenosis of bilateral carotid arteries: Secondary | ICD-10-CM | POA: Diagnosis not present

## 2020-04-29 DIAGNOSIS — I10 Essential (primary) hypertension: Secondary | ICD-10-CM

## 2020-04-29 DIAGNOSIS — I5022 Chronic systolic (congestive) heart failure: Secondary | ICD-10-CM

## 2020-04-29 DIAGNOSIS — Z9289 Personal history of other medical treatment: Secondary | ICD-10-CM

## 2020-04-29 HISTORY — DX: Personal history of other medical treatment: Z92.89

## 2020-04-29 LAB — BASIC METABOLIC PANEL
BUN/Creatinine Ratio: 16 (ref 10–24)
BUN: 13 mg/dL (ref 8–27)
CO2: 24 mmol/L (ref 20–29)
Calcium: 10.6 mg/dL — ABNORMAL HIGH (ref 8.6–10.2)
Chloride: 97 mmol/L (ref 96–106)
Creatinine, Ser: 0.79 mg/dL (ref 0.76–1.27)
GFR calc Af Amer: 103 mL/min/{1.73_m2} (ref >59)
GFR calc non Af Amer: 89 mL/min/{1.73_m2} (ref >59)
Glucose: 170 mg/dL — ABNORMAL HIGH (ref 65–99)
Potassium: 4.3 mmol/L (ref 3.5–5.2)
Sodium: 136 mmol/L (ref 134–144)

## 2020-04-30 ENCOUNTER — Encounter: Payer: Self-pay | Admitting: Physician Assistant

## 2020-05-05 ENCOUNTER — Ambulatory Visit (INDEPENDENT_AMBULATORY_CARE_PROVIDER_SITE_OTHER): Payer: Medicare HMO | Admitting: Family Medicine

## 2020-05-05 ENCOUNTER — Other Ambulatory Visit: Payer: Self-pay

## 2020-05-05 DIAGNOSIS — E118 Type 2 diabetes mellitus with unspecified complications: Secondary | ICD-10-CM

## 2020-05-05 MED ORDER — HYDROCORTISONE ACETATE 2.5 % EX CREA: 1.0000 "application " | TOPICAL_CREAM | Freq: Two times a day (BID) | CUTANEOUS | 1 refills | Status: DC

## 2020-05-05 MED ORDER — SITAGLIPTIN PHOSPHATE 50 MG PO TABS: 50.0000 mg | ORAL_TABLET | Freq: Every day | ORAL | 5 refills | Status: DC

## 2020-05-05 NOTE — Progress Notes (Signed)
Subjective:    Patient ID: Cameron Daniel, male    DOB: [birth year], 74 y.o.   MRN: [id number]  HPI  Unfortunately, patient was recently admitted to the hospital and was found to have severe three-vessel coronary artery disease as well as congestive heart failure.  I have copied relevant portions of the discharge summary below for my reference:  Admit date: 04/12/2019 Discharge date: 05/02/2019  Admission Diagnoses:      Patient Active Problem List   Diagnosis Date Noted  . Hyponatremia 04/20/2019  . Retroperitoneal hematoma 04/18/2019  . Acute on chronic blood loss anemia 04/18/2019  . Acute systolic HF (heart failure) ([place]) 04/15/2019  . Non-ST elevation (NSTEMI) myocardial infarction ([place]) 04/15/2019  . AKI (acute kidney injury) ([place])   . Community acquired pneumonia of right lower lobe of lung ([place]) 04/12/2019  . Acute respiratory failure ([place]) 04/12/2019  . Allergic conjunctivitis 01/23/2018  . Chronic sinusitis 01/23/2018  . Gout 04/06/2012  . DIVERTICULITIS-COLON 06/18/2010  . FACIAL FLUSHING 12/11/2007  . NOCTURIA 12/11/2007  . Type 2 diabetes mellitus with hyperlipidemia ([place]) 09/07/2007  . OTHER MALAISE AND FATIGUE 09/07/2007  . Allergic rhinitis 08/16/2007  . HYPERLIPIDEMIA 01/24/2007  . OBESITY 01/24/2007  . ERECTILE DYSFUNCTION 01/24/2007  . HYPERTENSION 01/24/2007  . Coronary atherosclerosis 01/24/2007  . GERD 01/24/2007  . DIVERTICULOSIS, COLON 01/24/2007  . IRRITABLE BOWEL SYNDROME 01/24/2007  . COLONIC POLYPS, HX OF 01/24/2007  . CHOLECYSTECTOMY, HX OF 01/24/2007    Discharge Diagnoses:  Principal Problem:   Non-ST elevation (NSTEMI) myocardial infarction [facility]) Active Problems:   Type 2 diabetes mellitus with hyperlipidemia (HCC)   Coronary atherosclerosis   GERD   Community acquired pneumonia of right lower lobe of lung ([place])   Acute respiratory failure (HCC)   AKI (acute kidney injury) ([place])   Acute systolic HF (heart failure) (HCC)    Retroperitoneal hematoma   Acute on chronic blood loss anemia   Hyponatremia   S/P CABG x 3   Discharged Condition: good  HPI:  The patient is a 74 year old gentleman with history of hypertension, diabetes, hyperlipidemia, coronary artery disease status post angioplasty in 1985 who was seen in the emergency room on 04/12/2019 for possible dehydration after working outside in the heat. His wife said they spent about 7 hours in the ER was given intravenous fluids and sent home. He returned with chills, fatigue, cough, and dyspnea and a CT scan of the chest showed right lower lobe pneumonia. High-sensitivity troponin was mildly elevated at 136 and subsequent values were 150 and 1,963. He developed progressive respiratory failure with hypoxia and tachypnea and required intubation in the emergency room. He was seen by PCCM.He was started on antibiotics. A post intubation chest x-ray showed central pulmonary edema. He was extubated the following day and did well initially and then developed acute respiratory distress requiring increased oxygen and diuresis. He had a stat echocardiogram which showed ejection fraction of 25 to 30% although it was poor quality. There is no significant valvular dysfunction. He underwent cardiac catheterization today showing severe three-vessel coronary disease. Results of his right heart catheterization arenot in the cath note although says that there was mild pulmonary hypertension and normal LVEDP.  The patient's wife is with him today. He denies any history of chest pain or pressure. He said that he stays active working in his yard. He has occasional episodes of exertional shortness of breath but stops to rest and it goes away and he resumes his activity. He feels like  his energy level has been good.   Hospital Course:   On 05/27/2019 Cameron Daniel underwent a CABG x 3 with Dr. Cyndia Bent.  He was transferred to the surgical ICU for continued care.  He was  extubated in a timely manner.  Heart failure was consulted to assist with milrinone titration.  Intra-Op estimated ejection fraction was 45 to 50%.  Postop day 1 we were able to wean him off of his epinephrine.  We continued milrinone for now.  We gave some albumin and Lasix to stimulate diuresis.  We transitioned his insulin drip to Levemir and sliding scale insulin.  We discontinued his chest tubes.  We initiated Lovenox therapy for DVT prophylaxis.  Postop day 2 his co-ox was 56% therefore we able to decrease milrinone to 0.125.  He was given a dose of IV Lasix.  We continue to encourage use of his incentive spirometer.  Postop day 3 his only complaint was regarding his pain in his buttocks from sitting in the chair.  We continue diuresis due to fluid overload.  His co-ox increased to 67%, therefore we discontinued milrinone at this time.  We will continue to encourage ambulation and incentive spirometer use.  His Levemir dose was decreased due to hypoglycemia.  Postop day 4 he was off all of his drips.  He felt like he was recovering well.  His kidney function remained stable.  His Lasix was discontinued but he remained on spironolactone.  He was stable at this time to transfer to the stepdown unit for continued care.  On the stepdown unit, his two-view chest x-ray showed Decreased left basilar and right upper lobe atelectasis and interval minimal right basilar atelectasis.  Today, he is ambulating with limited assistance, tolerating room air, his incisions are healing well, and he is ready for discharge.  05/07/19 Patient is here today for follow-up.  He feels extremely weak and tired after his open heart surgery which is to be expected.  The sternal incision appears to be healing well with no evidence of secondary cellulitis.  He does have a large hematoma on his right flank and just above his right gluteus.  However this appears to be fading.  He is tender to palpation in that area.  He did have a  retroperitoneal hematoma after his catheterization.  His hemoglobin at discharge was 10.  He is due to recheck that today.  I discussed his case with his cardiologist who recommended starting him on Jardiance for his diabetes given the cardiovascular protective effects which certainly is reasonable.  Previously the patient had Candida balanitis and also dizziness associated with Jardiance however he understands the positive implications of this medication and he is willing to try it again.  He would like clotrimazole cream to have on hand in case he develops a rash on the glans penis.  He does report polyuria ever since starting the medication.  We also discontinued his pioglitazone given the reduced ejection fraction that the patient had in the hospital and the pulmonary edema.  I felt that medication was too dangerous for him to be taking now.  He denies any shortness of breath.  He denies any cough.  He denies any fever.  He denies any dysuria.  He is ambulating using a walker for balance.  He denies any leg swelling or evidence of a DVT.  At that time, my plan was: Switch the patient to [facility] and discontinue pioglitazone given his congestive heart failure and recent non-ST elevation myocardial infarction.  I believe the long-term cardiovascular protective effects outweigh any potential risk.  I did give the patient a prescription for Clotrimazole cream to use in case he develops Candida balanitis.  Recheck a CBC to monitor his anemia.  Recommended a multivitamin with iron to help regenerate the blood loss he experienced in the hospital.  Patient's blood pressure today is well controlled.  Ideally I like the patient to be on an angiotensin receptor blocker or an ACE inhibitor.  However he feels extremely weak and dizzy right now so I will not start that at this time.  I will recheck the patient in a month and if his strength is improving and he is tolerating the Jardiance we may try a low-dose ACE inhibitor  at that time.  Patient received his flu shot today.  Also recommended discontinuation of testosterone given the potential increased risk of cardiovascular events on this medication.  06/07/19 Patient looks remarkable today.  He is doing very well.  He is tolerating the Jardiance without any complications however he has been applying nystatin to his glans on a daily basis.  I have discouraged that.  Instead I have recommended that he keep that area dry.  He can use of desiccant powder such as [employer].  I recommended only using nystatin if there is an active infection.  This way we avoid antibiotic resistance in the yeast.  He denies any chest pain.  His surgical scar on sternum is healing well.  He does have a small seroma on his medial right calf that appears today where they took the vein.  However there is no erythema or warmth in that area.  This seems to be slowly improving.  The large hematoma on his back has completely resolved.  He denies any hematuria or hematochezia or melena.  He denies any dyspnea on exertion.  He denies any pleurisy.  He denies any angina.  Blood pressures well controlled today.  My last visit I made mention of starting an ACE inhibitor however he is on Benicar and therefore this is not necessary.  Therefore he is already on a beta-blocker as well as an angiotensin receptor blocker plus Jardiance.  His preoperative ejection fraction was 25 to 30% however his intraoperative TEE showed an ejection fraction up to 50%.  Therefore Delene Loll is likely not necessary with beneficial.  Therefore he is on optimal medical therapy at this time for his congestive heart failure.  At that time, my plan was: Repeat the CMP today to monitor the patient's calcium.  Calcium is persistently elevated we may need to check his parathyroid hormone status.  His hemoglobin at his last visit was greater than 12 and therefore I will not recheck a CBC today.  His blood pressure is outstanding.  He is reporting  fasting blood sugars between 100-120 and his 2-hour postprandial sugars are under 150.  Therefore his diabetes seems well controlled.  I have recommended returning in 3 months to recheck an A1c and a fasting lipid panel.  Otherwise continue his current medical therapy.  Patient failed to get his flu shot at his last visit so he got it today.  08/09/19 Since his hospitalization this summer, the patient has lost substantial weight.  As result his blood sugars have started to drop.  Recently he has been experiencing episodes of hypoglycemia where he feels dizzy and lightheaded and jittery.  He is also been having episodes of hypotension with systolic blood pressure approaching 100.  At this point, the patient  feels lightheaded and dizzy.  He states the highest his blood pressure has been has been in the 751 systolic.  He denies any chest pain shortness of breath or dyspnea on exertion.  His last A1c was checked in August and is due again however he states the majority of his blood sugars are under 130.  He denies any polyuria polydipsia or blurry vision.  At that time, my plan was: I believe this patient is having episodes of hypotension.  I believe partly this is due to his lifestyle changes and weight loss coupled with the carvedilol, the Benicar, and the Jardiance.  I have recommended that he reduce his dose of Benicar from 20 mg a day to 10 mg a day and less monitor his blood pressure closely and see if his blood pressure stabilizes.  I explained the importance of the medication and helping his ejection fraction recover.  Therefore I would like to keep him on this medication if at all possible.  Patient understands and will monitor his blood pressure.  I have explained to the patient that his blood pressure is not dangerous as long as his systolic blood pressures greater than 100 unless he is having symptoms of hypotension.  I also believe he is having episodes of hypoglycemia brought on by most likely  glipizide and his weight loss.  Therefore I have recommended that the patient discontinue glipizide.  As an aside, the patient states that he feels anxious all the time.  He states that he constantly feels jittery and at times the anxiety gets out of control.  He has not been taking his Celexa.  I have encouraged the patient to start taking Celexa 10 mg a day and allow 3 to 4 weeks for the medication to take effect.  If at that time the anxiety is still a problem we could increase the Celexa to 20 mg however I would not go beyond 20 mg due to his cardiac history and age.  Patient is comfortable with this plan.  More than 25 minutes was spent with the patient in discussion.  03/05/20 Patient has not had a repeat echocardiogram since August of last year.  He denies any chest pain.  He denies any shortness of breath.  He denies any dyspnea on exertion.  However he states that due to Covid he has been unable to see his cardiologist.  Patient had an ejection fraction of 25 to 30% prior to his bypass.  He denies any orthopnea or paroxysmal nocturnal dyspnea.  He also has a history of type 2 diabetes.  He denies any polyuria, polydipsia, or blurry vision.  He denies any hypoglycemic episodes.  He is due to recheck his fasting lab work.  He is also overdue to check for prostate cancer with a PSA.  Today he specifically would like to be evaluated for pneumonia.  He denies any cough or chest pain or pleurisy or hemoptysis or purulent sputum.  Patient states that he just wants to be checked.  He also had a diabetic foot exam performed which shows diminished sensation to 10 g monofilament in both feet.  He has normal dorsalis pedis and posterior tibialis pulses but evidence of peripheral neuropathy due to his diminished sensation.  At that time, my plan was: Patient received a booster on his pneumonia vaccine.  I have asked him to return fasting for a PSA.  I would like to check a hemoglobin A1c.  Goal hemoglobin A1c is  less than 6.5.  I would like to check a fasting lipid panel.  Goal LDL cholesterol is less than 70.  I would like to check a urine microalbumin.  Schedule the patient for an echocardiogram to follow-up on his congestive heart failure.  He is currently on Coreg and Jardiance.  He is not on an ACE inhibitor or an ARB or Entresto.  If his ejection fraction remains suppressed I would recommend starting him on either an ARB or Entresto.  03/26/20  Echo- Comparison(s): No significant change from prior study. 04/26/19: TEE, LVEF  45-50%, inferior hypokinesis. The current study demonstrates high LV  filling pressure consistent with congestive heart failure.   Patient is here today to discuss further. Patient works in Biomedical scientist. Is extremely hot outside. He is sweating profusely every day. Some days he gets extremely lightheaded. Most days he comes in around lunchtime and has to take a nap due to the fact he is so tired. He denies any chest pain or shortness of breath or dyspnea on exertion. He is not taking an ACE inhibitor. In the past he took Benicar. He denies any palpitations or tachyarrhythmias. He denies any syncope or near syncope. On his recent lab work his A1c was elevated at 7.3. We have started the patient on Metformin extended release 1000 mg a day in addition to his Jardiance. Thus far he is tolerating the medication without worsening of his diarrhea.  At that time, my plan was: Blood pressure is relatively low at 116/70. Given his frequent episodes of dehydration and lightheadedness, I am concerned that the patient may experience orthostatic hypotension if we had Benicar or Entresto. We discussed this at length. Patient would like to take the Metformin first and recheck in 3 months his A1c. This would put [place] in October when it is much cooler outside. He would then like to try to add a low-dose of Benicar so that he can start adjusting to the medication when he is not sweating so much and dealing with  dehydration as often. I believe this is appropriate. I did recommend that he schedule his follow-up with his cardiologist as well. Tentatively I will plan to recheck his A1c in October and I would like to start him on Benicar 5 to 10 mg a day if tolerated  05/05/20 Patient states that he had to stop Metformin due to diarrhea. The diarrhea was so bad it is aggravated his hemorrhoids. He is requesting a refill on his 2.5% hydrocortisone cream that he used previously which seem to work better for him. He denies any blood in his stool or melena. However he is on a combination of Jardiance and Metformin for his diabetes. We will need to replace Metformin with an alternative if he discontinues Metformin which he is decided to do. We discussed Trulicity versus Januvia versus glipizide. We weighed the pros and cons of each. Ultimately he would like to try Januvia due to his low incidence of side effects. Since I last saw him, cardiology has started him on losartan 25 mg a day due to his hypotension. Therefore we will not add Benicar as we previously discussed.   Past Medical History:  Diagnosis Date  . Allergic rhinitis, cause unspecified   . Allergy   . Anxiety   . Arthritis   . Cancer ([place])   . Carotid Doppler 04/2020   Carotid US 9/21: No evidence of ICA stenosis bilaterally; right vertebral artery stenosis  . Cataract   . Chronic systolic CHF (congestive heart failure) ([place])   .  Coronary atherosclerosis of unspecified type of vessel, native or graft    a. MI 1985 with unclear details. b. CABG 03/2019 with LIMA -> LAD, SVG -> OM, SVG to dRCA.  . Depression    PTSD   . Diverticulosis of colon (without mention of hemorrhage)   . Esophageal reflux   . Esophageal stricture   . Essential hypertension   . Former tobacco use   . Hemorrhoids   . Hyperlipidemia   . Irritable bowel syndrome   . Ischemic cardiomyopathy   . Mild carotid artery disease (HCC)    a. 1-39% bilaterally 03/2019 duplex.  .  Myocardial infarction ([place])   . Nocturia   . Obesity, unspecified   . Orthostasis   . Other acquired absence of organ   . Personal history of colonic polyps   . Psychosexual dysfunction with inhibited sexual excitement   . Retroperitoneal bleed   . Type II or unspecified type diabetes mellitus without mention of complication, not stated as uncontrolled    Past Surgical History:  Procedure Laterality Date  . ADENOIDECTOMY    . CARPAL TUNNEL RELEASE     left   . CHOLECYSTECTOMY    . COLON RESECTION     18 inches  . COLON SURGERY  2013  . CORONARY ANGIOPLASTY     1985   . CORONARY ARTERY BYPASS GRAFT N/A 04/26/2019   Procedure: CORONARY ARTERY BYPASS GRAFTING (CABG) x Three, using left internal mammary artery and right leg greater saphenous vein harvested endoscopically;  Surgeon: Gaye Pollack, MD;  Location: [facility];  Service: Open Heart Surgery;  Laterality: N/A;  . ELBOW BURSA SURGERY    . EYE SURGERY    . POLYPECTOMY    . RIGHT/LEFT HEART CATH AND CORONARY ANGIOGRAPHY N/A 04/16/2019   Procedure: RIGHT/LEFT HEART CATH AND CORONARY ANGIOGRAPHY;  Surgeon: Belva Crome, MD;  Location: [facility];  Service: Cardiovascular;  Laterality: N/A;  . SHOULDER SURGERY     Rt. Shoulder  . TEE WITHOUT CARDIOVERSION N/A 04/26/2019   Procedure: TRANSESOPHAGEAL ECHOCARDIOGRAM (TEE);  Surgeon: Gaye Pollack, MD;  Location: [facility];  Service: Open Heart Surgery;  Laterality: N/A;  . TONSILLECTOMY     Current Outpatient Medications on File Prior to Visit  Medication Sig Dispense Refill  . acetaminophen (TYLENOL) 500 MG tablet Take 1,000 mg by mouth at bedtime as needed for mild pain.    . Ascorbic Acid (VITAMIN C) 100 MG tablet Take 100 mg by mouth daily.    Marland Kitchen aspirin EC 81 MG tablet Take 1 tablet (81 mg total) by mouth daily. 90 tablet 3  . calcium-vitamin D (OSCAL WITH D) 500-200 MG-UNIT tablet Take 1 tablet by mouth.    . carvedilol (COREG) 3.125 MG tablet Take 1 tablet (3.125 mg  total) by mouth 2 (two) times daily with a meal. 180 tablet 3  . citalopram (CELEXA) 10 MG tablet Take 1 tablet (10 mg total) by mouth daily. 90 tablet 2  . clopidogrel (PLAVIX) 75 MG tablet Take 1 tablet (75 mg total) by mouth daily. 90 tablet 3  . colestipol (COLESTID) 5 g packet Take 5 g by mouth daily.     . empagliflozin (JARDIANCE) 25 MG TABS tablet Take 25 mg by mouth daily before breakfast. 90 tablet 3  . fexofenadine (ALLEGRA) 180 MG tablet Take 1 tablet (180 mg total) by mouth daily as needed. For allergies 90 tablet 0  . finasteride (PROSCAR) 5 MG tablet Take 1 tablet (  5 mg total) by mouth daily. 90 tablet 3  . fluticasone (FLONASE) 50 MCG/ACT nasal spray SPRAY 2 SPRAYS INTO EACH NOSTRIL EVERY DAY 48 mL 2  . losartan (COZAAR) 25 MG tablet Take 1 tablet (25 mg total) by mouth daily. 90 tablet 1  . loteprednol (LOTEMAX) 0.5 % ophthalmic suspension Place 1 drop into both eyes 4 (four) times daily. 5 mL 0  . metFORMIN (GLUCOPHAGE XR) 750 MG 24 hr tablet Take 1 tablet (750 mg total) by mouth daily with breakfast. 90 tablet 3  . nystatin cream (MYCOSTATIN) Apply 1 application topically 3 (three) times daily. 60 g 0  . pantoprazole (PROTONIX) 40 MG tablet TAKE 1 TABLET (40 MG TOTAL) BY MOUTH EVERY MORNING. 90 tablet 3  . rosuvastatin (CRESTOR) 40 MG tablet Take 1 tablet (40 mg total) by mouth daily at 6 PM. 90 tablet 3  . vitamin B-12 (CYANOCOBALAMIN) 500 MCG tablet Take 500 mcg by mouth daily.     No current facility-administered medications on file prior to visit.   Allergies  Allergen Reactions  . Doxazosin Mesylate     UNKNOWN REACTION  . Flomax [Tamsulosin Hcl] Itching       . Penicillins Rash and Other (See Comments)    Has patient had a PCN reaction causing immediate rash, facial/tongue/throat swelling, SOB or lightheadedness with hypotension: No Has patient had a PCN reaction causing severe rash involving mucus membranes or skin necrosis: No Has patient had a PCN reaction that  required hospitalization No Has patient had a PCN reaction occurring within the last 10 years: No If all of the above answers are "NO", then may proceed with Cephalosporin use.   . Sulfonamide Derivatives Rash  . Trulicity [Dulaglutide] Nausea And Vomiting    N&V, Dizziness   Social History   Socioeconomic History  . Marital status: Married    Spouse name: Not on file  . Number of children: 1  . Years of education: 61  . Highest education level: High school graduate  Occupational History  . 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 Smoking status: Former Smoker    Quit date: 08/30/1983    Years since quitting: 36.7  . Smokeless tobacco: Former Systems developer  . Tobacco comment: Quit 30 years ago.  Vaping Use  . Vaping Use: Never used  Substance and Sexual Activity  . Alcohol use: Yes    Comment: rare  . Drug use: No  . Sexual activity: Yes  Other Topics Concern  . Not on file  Social History Narrative  . Not on file   Social Determinants of Health   Financial Resource Strain: Low Risk   . Difficulty of Paying Living Expenses: Not hard at all  Food Insecurity: No Food Insecurity  . Worried About Charity fundraiser in the Last Year: Never true  . Ran Out of Food in the Last Year: Never true  Transportation Needs: No Transportation Needs  . Lack of Transportation (Medical): No  . Lack of Transportation (Non-Medical): No  Physical Activity: Inactive  . Days of Exercise per Week: 0 days  . Minutes of Exercise per Session: 0 min  Stress: No Stress Concern Present  . Feeling of Stress : Only a little  Social Connections:   . Frequency of Communication with Friends and Family: Not on file  . Frequency of Social Gatherings with Friends and Family: Not on file  . Attends Religious Services: Not on file  . Active Member of Clubs or Organizations: Not  on file  . Attends Archivist Meetings: Not on file  . Marital Status: Not on file  Intimate Partner Violence:   . Fear of  Current or Ex-Partner: Not on file  . Emotionally Abused: Not on file  . Physically Abused: Not on file  . Sexually Abused: Not on file     Review of Systems  All other systems reviewed and are negative.      Objective:   Physical Exam Vitals reviewed.  Constitutional:      General: He is not in acute distress.    Appearance: He is obese. He is not ill-appearing or toxic-appearing.  Cardiovascular:     Rate and Rhythm: Normal rate and regular rhythm.     Pulses: Normal pulses.     Heart sounds: Normal heart sounds. No murmur heard.  No friction rub. No gallop.   Pulmonary:     Effort: Pulmonary effort is normal. No respiratory distress.     Breath sounds: Normal breath sounds. No stridor. No wheezing, rhonchi or rales.  Chest:     Chest wall: No tenderness.  Abdominal:     General: Abdomen is flat. Bowel sounds are normal. There is no distension.     Palpations: Abdomen is soft. There is no mass.     Tenderness: There is no abdominal tenderness. There is no right CVA tenderness, left CVA tenderness, guarding or rebound.     Hernia: No hernia is present.  Musculoskeletal:     Right lower leg: No edema.     Left lower leg: No edema.  Neurological:     Mental Status: He is alert.           Assessment & Plan:  Hypercalcemia - Plan: BASIC METABOLIC PANEL WITH GFR, Parathyroid hormone, intact (no Ca), VITAMIN D 25 Hydroxy (Vit-D Deficiency, Fractures)  Controlled type 2 diabetes mellitus with complication, without long-term current use of insulin (HCC)  Discontinue Metformin and replace with Januvia 50 mg a day and recheck hemoglobin A1c in 3 months. Continue Jardiance for now. Continue losartan 25 mg a day along with carvedilol as this optimizes his treatment of congestive heart failure. Blood pressure today is acceptable and he denies any orthostatic hypotension.

## 2020-05-06 LAB — BASIC METABOLIC PANEL WITH GFR
BUN: 10 mg/dL (ref 7–25)
CO2: 27 mmol/L (ref 20–32)
Calcium: 10.7 mg/dL — ABNORMAL HIGH (ref 8.6–10.3)
Chloride: 100 mmol/L (ref 98–110)
Creat: 0.93 mg/dL (ref 0.70–1.18)
GFR, Est African American: 94 mL/min/{1.73_m2} (ref >=60)
GFR, Est Non African American: 81 mL/min/{1.73_m2} (ref >=60)
Glucose, Bld: 153 mg/dL — ABNORMAL HIGH (ref 65–99)
Potassium: 4.1 mmol/L (ref 3.5–5.3)
Sodium: 138 mmol/L (ref 135–146)

## 2020-05-06 LAB — VITAMIN D 25 HYDROXY (VIT D DEFICIENCY, FRACTURES): Vit D, 25-Hydroxy: 29 ng/mL — ABNORMAL LOW (ref 30–100)

## 2020-05-06 LAB — PARATHYROID HORMONE, INTACT (NO CA): PTH: 61 pg/mL (ref 14–64)

## 2020-05-08 ENCOUNTER — Other Ambulatory Visit: Payer: Self-pay

## 2020-05-08 DIAGNOSIS — E559 Vitamin D deficiency, unspecified: Secondary | ICD-10-CM

## 2020-05-08 DIAGNOSIS — E58 Dietary calcium deficiency: Secondary | ICD-10-CM

## 2020-05-11 ENCOUNTER — Telehealth: Payer: Self-pay

## 2020-05-11 NOTE — Telephone Encounter (Signed)
Advised patient that Dr.Pickard wants him to be seen by endo. Verbalized understanding. Understands that once referral has been worked, someone will call to schedule the appt

## 2020-05-14 ENCOUNTER — Encounter (HOSPITAL_COMMUNITY): Payer: Medicare HMO | Admitting: Internal Medicine

## 2020-05-27 ENCOUNTER — Other Ambulatory Visit: Payer: Self-pay | Admitting: Physician Assistant

## 2020-05-29 ENCOUNTER — Ambulatory Visit: Payer: Self-pay | Admitting: Family Medicine

## 2020-05-30 ENCOUNTER — Other Ambulatory Visit: Payer: Self-pay | Admitting: Family Medicine

## 2020-06-07 ENCOUNTER — Other Ambulatory Visit: Payer: Self-pay | Admitting: Family Medicine

## 2020-06-19 ENCOUNTER — Emergency Department (HOSPITAL_COMMUNITY): Payer: Medicare HMO

## 2020-06-19 ENCOUNTER — Inpatient Hospital Stay (HOSPITAL_COMMUNITY)
Admission: EM | Admit: 2020-06-19 | Discharge: 2020-06-26 | DRG: 225 | Disposition: A | Discharge status: Home / Self Care | Payer: Medicare HMO | Source: Ambulatory Visit | Attending: Cardiology | Admitting: Cardiology

## 2020-06-19 ENCOUNTER — Other Ambulatory Visit: Payer: Self-pay

## 2020-06-19 ENCOUNTER — Encounter (HOSPITAL_COMMUNITY): Payer: Self-pay | Admitting: Emergency Medicine

## 2020-06-19 ENCOUNTER — Encounter: Payer: Self-pay | Admitting: Family Medicine

## 2020-06-19 ENCOUNTER — Inpatient Hospital Stay (HOSPITAL_COMMUNITY)
Admission: EM | Disposition: A | Discharge status: Home / Self Care | Payer: Self-pay | Source: Ambulatory Visit | Attending: Cardiology

## 2020-06-19 DIAGNOSIS — I4510 Unspecified right bundle-branch block: Secondary | ICD-10-CM | POA: Diagnosis present

## 2020-06-19 DIAGNOSIS — I255 Ischemic cardiomyopathy: Secondary | ICD-10-CM | POA: Diagnosis present

## 2020-06-19 DIAGNOSIS — Z951 Presence of aortocoronary bypass graft: Secondary | ICD-10-CM | POA: Diagnosis not present

## 2020-06-19 DIAGNOSIS — Z880 Allergy status to penicillin: Secondary | ICD-10-CM

## 2020-06-19 DIAGNOSIS — Z801 Family history of malignant neoplasm of trachea, bronchus and lung: Secondary | ICD-10-CM | POA: Diagnosis not present

## 2020-06-19 DIAGNOSIS — I110 Hypertensive heart disease with heart failure: Secondary | ICD-10-CM | POA: Diagnosis not present

## 2020-06-19 DIAGNOSIS — R002 Palpitations: Secondary | ICD-10-CM | POA: Diagnosis not present

## 2020-06-19 DIAGNOSIS — R000 Tachycardia, unspecified: Secondary | ICD-10-CM | POA: Diagnosis not present

## 2020-06-19 DIAGNOSIS — E785 Hyperlipidemia, unspecified: Secondary | ICD-10-CM | POA: Diagnosis present

## 2020-06-19 DIAGNOSIS — Z95810 Presence of automatic (implantable) cardiac defibrillator: Secondary | ICD-10-CM

## 2020-06-19 DIAGNOSIS — Z9861 Coronary angioplasty status: Secondary | ICD-10-CM | POA: Diagnosis not present

## 2020-06-19 DIAGNOSIS — I214 Non-ST elevation (NSTEMI) myocardial infarction: Secondary | ICD-10-CM | POA: Diagnosis not present

## 2020-06-19 DIAGNOSIS — I25119 Atherosclerotic heart disease of native coronary artery with unspecified angina pectoris: Secondary | ICD-10-CM | POA: Diagnosis not present

## 2020-06-19 DIAGNOSIS — E871 Hypo-osmolality and hyponatremia: Secondary | ICD-10-CM | POA: Diagnosis not present

## 2020-06-19 DIAGNOSIS — Z888 Allergy status to other drugs, medicaments and biological substances status: Secondary | ICD-10-CM | POA: Diagnosis not present

## 2020-06-19 DIAGNOSIS — I472 Ventricular tachycardia, unspecified: Secondary | ICD-10-CM

## 2020-06-19 DIAGNOSIS — J90 Pleural effusion, not elsewhere classified: Secondary | ICD-10-CM | POA: Diagnosis not present

## 2020-06-19 DIAGNOSIS — E119 Type 2 diabetes mellitus without complications: Secondary | ICD-10-CM | POA: Diagnosis present

## 2020-06-19 DIAGNOSIS — Z8249 Family history of ischemic heart disease and other diseases of the circulatory system: Secondary | ICD-10-CM

## 2020-06-19 DIAGNOSIS — I2510 Atherosclerotic heart disease of native coronary artery without angina pectoris: Secondary | ICD-10-CM | POA: Diagnosis present

## 2020-06-19 DIAGNOSIS — Z20822 Contact with and (suspected) exposure to covid-19: Secondary | ICD-10-CM | POA: Diagnosis not present

## 2020-06-19 DIAGNOSIS — I10 Essential (primary) hypertension: Secondary | ICD-10-CM | POA: Diagnosis not present

## 2020-06-19 DIAGNOSIS — Z882 Allergy status to sulfonamides status: Secondary | ICD-10-CM | POA: Diagnosis not present

## 2020-06-19 DIAGNOSIS — R531 Weakness: Secondary | ICD-10-CM | POA: Diagnosis present

## 2020-06-19 DIAGNOSIS — I5022 Chronic systolic (congestive) heart failure: Secondary | ICD-10-CM | POA: Diagnosis not present

## 2020-06-19 DIAGNOSIS — I517 Cardiomegaly: Secondary | ICD-10-CM | POA: Diagnosis not present

## 2020-06-19 HISTORY — PX: LEFT HEART CATH AND CORS/GRAFTS ANGIOGRAPHY: CATH118250

## 2020-06-19 LAB — RESPIRATORY PANEL BY RT PCR (FLU A&B, COVID)
Influenza A by PCR: NEGATIVE
Influenza B by PCR: NEGATIVE
SARS Coronavirus 2 by RT PCR: NEGATIVE

## 2020-06-19 LAB — CBC
HCT: 48 % (ref 39.0–52.0)
Hemoglobin: 15.5 g/dL (ref 13.0–17.0)
MCH: 28.4 pg (ref 26.0–34.0)
MCHC: 32.3 g/dL (ref 30.0–36.0)
MCV: 87.9 fL (ref 80.0–100.0)
Platelets: 141 10*3/uL — ABNORMAL LOW (ref 150–400)
RBC: 5.46 MIL/uL (ref 4.22–5.81)
RDW: 14 % (ref 11.5–15.5)
WBC: 9.1 10*3/uL (ref 4.0–10.5)
nRBC: 0 % (ref 0.0–0.2)

## 2020-06-19 LAB — MAGNESIUM: Magnesium: 1.6 mg/dL — ABNORMAL LOW (ref 1.7–2.4)

## 2020-06-19 LAB — CBC WITH DIFFERENTIAL/PLATELET
Abs Immature Granulocytes: 0.04 10*3/uL (ref 0.00–0.07)
Basophils Absolute: 0.1 10*3/uL (ref 0.0–0.1)
Basophils Relative: 1 %
Eosinophils Absolute: 0 10*3/uL (ref 0.0–0.5)
Eosinophils Relative: 0 %
HCT: 49.9 % (ref 39.0–52.0)
Hemoglobin: 16.4 g/dL (ref 13.0–17.0)
Immature Granulocytes: 0 %
Lymphocytes Relative: 24 %
Lymphs Abs: 2.7 10*3/uL (ref 0.7–4.0)
MCH: 28.8 pg (ref 26.0–34.0)
MCHC: 32.9 g/dL (ref 30.0–36.0)
MCV: 87.5 fL (ref 80.0–100.0)
Monocytes Absolute: 0.8 10*3/uL (ref 0.1–1.0)
Monocytes Relative: 7 %
Neutro Abs: 7.6 10*3/uL (ref 1.7–7.7)
Neutrophils Relative %: 68 %
Platelets: 166 10*3/uL (ref 150–400)
RBC: 5.7 MIL/uL (ref 4.22–5.81)
RDW: 13.9 % (ref 11.5–15.5)
WBC: 11.3 10*3/uL — ABNORMAL HIGH (ref 4.0–10.5)
nRBC: 0 % (ref 0.0–0.2)

## 2020-06-19 LAB — CREATININE, SERUM
Creatinine, Ser: 0.91 mg/dL (ref 0.61–1.24)
GFR, Estimated: >60 mL/min (ref >60)

## 2020-06-19 LAB — BASIC METABOLIC PANEL
Anion gap: 13 (ref 5–15)
BUN: 18 mg/dL (ref 8–23)
CO2: 23 mmol/L (ref 22–32)
Calcium: 10.1 mg/dL (ref 8.9–10.3)
Chloride: 101 mmol/L (ref 98–111)
Creatinine, Ser: 1.09 mg/dL (ref 0.61–1.24)
GFR, Estimated: >60 mL/min (ref >60)
Glucose, Bld: 199 mg/dL — ABNORMAL HIGH (ref 70–99)
Potassium: 4.4 mmol/L (ref 3.5–5.1)
Sodium: 137 mmol/L (ref 135–145)

## 2020-06-19 LAB — TROPONIN I (HIGH SENSITIVITY)
Troponin I (High Sensitivity): 237 ng/L (ref <18)
Troponin I (High Sensitivity): 387 ng/L (ref <18)

## 2020-06-19 LAB — GLUCOSE, CAPILLARY
Glucose-Capillary: 130 mg/dL — ABNORMAL HIGH (ref 70–99)
Glucose-Capillary: 133 mg/dL — ABNORMAL HIGH (ref 70–99)
Glucose-Capillary: 141 mg/dL — ABNORMAL HIGH (ref 70–99)

## 2020-06-19 SURGERY — LEFT HEART CATH AND CORS/GRAFTS ANGIOGRAPHY
Anesthesia: LOCAL

## 2020-06-19 MED ORDER — IOHEXOL 350 MG/ML SOLN
INTRAVENOUS | Status: DC | PRN
  Administered 2020-06-19: 100 mL

## 2020-06-19 MED ORDER — FENTANYL CITRATE (PF) 100 MCG/2ML IJ SOLN
INTRAMUSCULAR | Status: DC | PRN
Start: 2020-06-19 — End: 2020-06-19
  Administered 2020-06-19: 25 ug via INTRAVENOUS

## 2020-06-19 MED ORDER — CITALOPRAM HYDROBROMIDE 10 MG PO TABS: 10.0000 mg | ORAL_TABLET | Freq: Every day | ORAL | Status: DC

## 2020-06-19 MED ORDER — ASPIRIN 81 MG PO CHEW
81.0000 mg | CHEWABLE_TABLET | ORAL | Status: AC
  Administered 2020-06-19: 81 mg via ORAL
  Filled 2020-06-19: qty 1

## 2020-06-19 MED ORDER — PANTOPRAZOLE SODIUM 40 MG PO TBEC
40.0000 mg | DELAYED_RELEASE_TABLET | Freq: Every morning | ORAL | Status: DC
  Administered 2020-06-20 – 2020-06-26 (×7): 40 mg via ORAL
  Filled 2020-06-19 (×7): qty 1

## 2020-06-19 MED ORDER — VERAPAMIL HCL 2.5 MG/ML IV SOLN
INTRAVENOUS | Status: AC
  Filled 2020-06-19: qty 2

## 2020-06-19 MED ORDER — ONDANSETRON HCL 4 MG/2ML IJ SOLN: 4.0000 mg | Freq: Four times a day (QID) | INTRAMUSCULAR | Status: DC | PRN

## 2020-06-19 MED ORDER — SODIUM CHLORIDE 0.9 % IV SOLN: INTRAVENOUS | Status: DC

## 2020-06-19 MED ORDER — VITAMIN C 100 MG PO TABS: 100.0000 mg | ORAL_TABLET | Freq: Every day | ORAL | Status: DC

## 2020-06-19 MED ORDER — ROSUVASTATIN CALCIUM 20 MG PO TABS: 40.0000 mg | ORAL_TABLET | Freq: Every day | ORAL | Status: DC

## 2020-06-19 MED ORDER — HEPARIN (PORCINE) IN NACL 1000-0.9 UT/500ML-% IV SOLN
INTRAVENOUS | Status: DC | PRN
  Administered 2020-06-19 (×2): 500 mL

## 2020-06-19 MED ORDER — ASPIRIN 81 MG PO CHEW: 81.0000 mg | CHEWABLE_TABLET | Freq: Every day | ORAL | Status: DC

## 2020-06-19 MED ORDER — HEPARIN SODIUM (PORCINE) 1000 UNIT/ML IJ SOLN
INTRAMUSCULAR | Status: AC
  Filled 2020-06-19: qty 1

## 2020-06-19 MED ORDER — ETOMIDATE 2 MG/ML IV SOLN
INTRAVENOUS | Status: AC
  Filled 2020-06-19: qty 10

## 2020-06-19 MED ORDER — SODIUM CHLORIDE 0.9 % IV SOLN: 250.0000 mL | INTRAVENOUS | Status: DC | PRN

## 2020-06-19 MED ORDER — FLUTICASONE PROPIONATE 50 MCG/ACT NA SUSP
2.0000 | Freq: Every day | NASAL | Status: DC | PRN
  Filled 2020-06-19: qty 16

## 2020-06-19 MED ORDER — CARVEDILOL 3.125 MG PO TABS
3.1250 mg | ORAL_TABLET | Freq: Two times a day (BID) | ORAL | Status: DC
  Administered 2020-06-19 – 2020-06-21 (×4): 3.125 mg via ORAL
  Filled 2020-06-19 (×4): qty 1

## 2020-06-19 MED ORDER — VITAMIN B-12 100 MCG PO TABS
500.0000 ug | ORAL_TABLET | Freq: Every day | ORAL | Status: DC
  Administered 2020-06-21 – 2020-06-26 (×6): 500 ug via ORAL
  Filled 2020-06-19 (×7): qty 5

## 2020-06-19 MED ORDER — LINAGLIPTIN 5 MG PO TABS
5.0000 mg | ORAL_TABLET | Freq: Every day | ORAL | Status: DC
  Administered 2020-06-20 – 2020-06-26 (×6): 5 mg via ORAL
  Filled 2020-06-19 (×6): qty 1

## 2020-06-19 MED ORDER — MIDAZOLAM HCL 2 MG/2ML IJ SOLN
INTRAMUSCULAR | Status: AC
  Filled 2020-06-19: qty 2

## 2020-06-19 MED ORDER — ASPIRIN 81 MG PO CHEW: 81.0000 mg | CHEWABLE_TABLET | ORAL | Status: DC

## 2020-06-19 MED ORDER — HYDRALAZINE HCL 20 MG/ML IJ SOLN: 10.0000 mg | INTRAMUSCULAR | Status: AC | PRN

## 2020-06-19 MED ORDER — ENOXAPARIN SODIUM 40 MG/0.4ML ~~LOC~~ SOLN
40.0000 mg | SUBCUTANEOUS | Status: DC
  Administered 2020-06-20: 40 mg via SUBCUTANEOUS
  Filled 2020-06-19: qty 0.4

## 2020-06-19 MED ORDER — LORATADINE 10 MG PO TABS: 10.0000 mg | ORAL_TABLET | Freq: Every day | ORAL | Status: DC | PRN

## 2020-06-19 MED ORDER — SODIUM CHLORIDE 0.9% FLUSH
3.0000 mL | Freq: Two times a day (BID) | INTRAVENOUS | Status: DC
  Administered 2020-06-20 – 2020-06-22 (×5): 3 mL via INTRAVENOUS

## 2020-06-19 MED ORDER — AMIODARONE HCL IN DEXTROSE 360-4.14 MG/200ML-% IV SOLN
INTRAVENOUS | Status: AC
  Filled 2020-06-19: qty 200

## 2020-06-19 MED ORDER — ROSUVASTATIN CALCIUM 20 MG PO TABS
40.0000 mg | ORAL_TABLET | Freq: Every day | ORAL | Status: DC
  Administered 2020-06-19 – 2020-06-25 (×7): 40 mg via ORAL
  Filled 2020-06-19 (×7): qty 2

## 2020-06-19 MED ORDER — HEPARIN (PORCINE) IN NACL 1000-0.9 UT/500ML-% IV SOLN
INTRAVENOUS | Status: AC
  Filled 2020-06-19: qty 1000

## 2020-06-19 MED ORDER — LOSARTAN POTASSIUM 25 MG PO TABS: 25.0000 mg | ORAL_TABLET | Freq: Every day | ORAL | Status: DC

## 2020-06-19 MED ORDER — FINASTERIDE 5 MG PO TABS
5.0000 mg | ORAL_TABLET | Freq: Every day | ORAL | Status: DC
  Administered 2020-06-20 – 2020-06-26 (×7): 5 mg via ORAL
  Filled 2020-06-19 (×7): qty 1

## 2020-06-19 MED ORDER — LIDOCAINE HCL (PF) 1 % IJ SOLN
INTRAMUSCULAR | Status: AC
  Filled 2020-06-19: qty 30

## 2020-06-19 MED ORDER — COLESTIPOL HCL 5 G PO PACK: 5.0000 g | PACK | Freq: Every day | ORAL | Status: DC

## 2020-06-19 MED ORDER — SODIUM CHLORIDE 0.9 % IV SOLN: INTRAVENOUS | Status: AC

## 2020-06-19 MED ORDER — ASPIRIN EC 81 MG PO TBEC
81.0000 mg | DELAYED_RELEASE_TABLET | Freq: Every day | ORAL | Status: DC
  Administered 2020-06-20 – 2020-06-26 (×7): 81 mg via ORAL
  Filled 2020-06-19 (×7): qty 1

## 2020-06-19 MED ORDER — CITALOPRAM HYDROBROMIDE 10 MG PO TABS
10.0000 mg | ORAL_TABLET | Freq: Every day | ORAL | Status: DC
  Administered 2020-06-19 – 2020-06-25 (×7): 10 mg via ORAL
  Filled 2020-06-19 (×7): qty 1

## 2020-06-19 MED ORDER — FENTANYL CITRATE (PF) 100 MCG/2ML IJ SOLN
INTRAMUSCULAR | Status: AC
  Filled 2020-06-19: qty 2

## 2020-06-19 MED ORDER — MIDAZOLAM HCL 2 MG/2ML IJ SOLN
INTRAMUSCULAR | Status: DC | PRN
  Administered 2020-06-19: 2 mg via INTRAVENOUS

## 2020-06-19 MED ORDER — ETOMIDATE 2 MG/ML IV SOLN
INTRAVENOUS | Status: AC | PRN
  Administered 2020-06-19: 5 mg via INTRAVENOUS

## 2020-06-19 MED ORDER — ACETAMINOPHEN 500 MG PO TABS: 1000.0000 mg | ORAL_TABLET | Freq: Every evening | ORAL | Status: DC | PRN

## 2020-06-19 MED ORDER — AMIODARONE HCL IN DEXTROSE 360-4.14 MG/200ML-% IV SOLN
60.0000 mg/h | INTRAVENOUS | Status: AC
  Filled 2020-06-19: qty 200

## 2020-06-19 MED ORDER — AMIODARONE LOAD VIA INFUSION
150.0000 mg | Freq: Once | INTRAVENOUS | Status: AC
  Administered 2020-06-19: 150 mg via INTRAVENOUS
  Filled 2020-06-19: qty 83.34

## 2020-06-19 MED ORDER — SODIUM CHLORIDE 0.9% FLUSH
3.0000 mL | Freq: Two times a day (BID) | INTRAVENOUS | Status: DC
  Administered 2020-06-19 – 2020-06-22 (×4): 3 mL via INTRAVENOUS

## 2020-06-19 MED ORDER — LOSARTAN POTASSIUM 25 MG PO TABS
25.0000 mg | ORAL_TABLET | Freq: Every morning | ORAL | Status: DC
  Administered 2020-06-20 – 2020-06-25 (×6): 25 mg via ORAL
  Filled 2020-06-19 (×7): qty 1

## 2020-06-19 MED ORDER — ACETAMINOPHEN 325 MG PO TABS
650.0000 mg | ORAL_TABLET | ORAL | Status: DC | PRN
  Administered 2020-06-20 – 2020-06-22 (×3): 650 mg via ORAL
  Filled 2020-06-19 (×3): qty 2

## 2020-06-19 MED ORDER — AMIODARONE HCL IN DEXTROSE 360-4.14 MG/200ML-% IV SOLN
30.0000 mg/h | INTRAVENOUS | Status: DC
  Administered 2020-06-19 – 2020-06-21 (×4): 30 mg/h via INTRAVENOUS
  Filled 2020-06-19 (×4): qty 200

## 2020-06-19 MED ORDER — LABETALOL HCL 5 MG/ML IV SOLN: 10.0000 mg | INTRAVENOUS | Status: AC | PRN

## 2020-06-19 MED ORDER — MAGNESIUM SULFATE 2 GM/50ML IV SOLN
2.0000 g | Freq: Once | INTRAVENOUS | Status: AC
  Administered 2020-06-19: 2 g via INTRAVENOUS
  Filled 2020-06-19: qty 50

## 2020-06-19 MED ORDER — LIDOCAINE HCL (PF) 1 % IJ SOLN
INTRAMUSCULAR | Status: DC | PRN
  Administered 2020-06-19: 15 mL

## 2020-06-19 MED ORDER — SODIUM CHLORIDE 0.9% FLUSH: 3.0000 mL | INTRAVENOUS | Status: DC | PRN

## 2020-06-19 MED ORDER — EMPAGLIFLOZIN 25 MG PO TABS
25.0000 mg | ORAL_TABLET | Freq: Every day | ORAL | Status: DC
  Administered 2020-06-20 – 2020-06-26 (×6): 25 mg via ORAL
  Filled 2020-06-19 (×7): qty 1

## 2020-06-19 SURGICAL SUPPLY — 10 items
CATH INFINITI 5 FR IM (CATHETERS) ×1 IMPLANT
CATH INFINITI 5FR MULTPACK ANG (CATHETERS) ×1 IMPLANT
ELECT DEFIB PAD ADLT CADENCE (PAD) ×1 IMPLANT
KIT HEART LEFT (KITS) ×2 IMPLANT
PACK CARDIAC CATHETERIZATION (CUSTOM PROCEDURE TRAY) ×2 IMPLANT
SHEATH PINNACLE 5F 10CM (SHEATH) ×1 IMPLANT
SYR MEDRAD MARK 7 150ML (SYRINGE) ×2 IMPLANT
TRANSDUCER W/STOPCOCK (MISCELLANEOUS) ×2 IMPLANT
TUBING CIL FLEX 10 FLL-RA (TUBING) ×2 IMPLANT
WIRE EMERALD 3MM-J .035X260CM (WIRE) ×1 IMPLANT

## 2020-06-19 NOTE — ED Notes (Signed)
Date and time results received: 06/19/20 0941 (use smartphrase ".now" to insert current time)  Test: troponin Critical Value: 237  Name of Provider Notified: curatolo  Orders Received? Or Actions Taken?: Actions Taken: no new orders

## 2020-06-19 NOTE — ED Provider Notes (Signed)
[facility] EMERGENCY DEPARTMENT Provider Note   CSN: [id number] Arrival date & time: 06/19/20  0830     History No chief complaint on file.   Cameron Daniel is a 74 y.o. male.  Transferred from PCP's office for V-tach with a pulse  The history is provided by the patient.  Palpitations Palpitations quality:  Fast Onset quality:  Sudden Duration:  16 hours Timing:  Constant Progression:  Worsening Chronicity:  New Relieved by:  Nothing Worsened by:  Nothing Associated symptoms: nausea   Associated symptoms: no back pain, no chest pain, no cough, no shortness of breath and no vomiting        Past Medical History:  Diagnosis Date  . Allergic rhinitis, cause unspecified   . Allergy   . Anxiety   . Arthritis   . Cancer ([place])   . Carotid Doppler 04/2020   Carotid US 9/21: No evidence of ICA stenosis bilaterally; right vertebral artery stenosis  . Cataract   . Chronic systolic CHF (congestive heart failure) ([place])   . Coronary atherosclerosis of unspecified type of vessel, native or graft    a. MI 1985 with unclear details. b. CABG 03/2019 with LIMA -> LAD, SVG -> OM, SVG to dRCA.  . Depression    PTSD   . Diverticulosis of colon (without mention of hemorrhage)   . Esophageal reflux   . Esophageal stricture   . Essential hypertension   . Former tobacco use   . Hemorrhoids   . Hyperlipidemia   . Irritable bowel syndrome   . Ischemic cardiomyopathy   . Mild carotid artery disease (HCC)    a. 1-39% bilaterally 03/2019 duplex.  . Myocardial infarction ([place])   . Nocturia   . Obesity, unspecified   . Orthostasis   . Other acquired absence of organ   . Personal history of colonic polyps   . Psychosexual dysfunction with inhibited sexual excitement   . Retroperitoneal bleed   . Type II or unspecified type diabetes mellitus without mention of complication, not stated as uncontrolled     Patient Active Problem List   Diagnosis Date Noted  .  Low back pain 01/02/2020  . 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 Hyponatremia 04/20/2019  . Retroperitoneal hematoma 04/18/2019  . Acute on chronic blood loss anemia 04/18/2019  . Acute systolic HF (heart failure) ([place]) 04/15/2019  . Non-ST elevation (NSTEMI) myocardial infarction ([place]) 04/15/2019  . AKI (acute kidney injury) ([place])   . Community acquired pneumonia of right lower lobe of lung 04/12/2019  . Acute respiratory failure ([place]) 04/12/2019  . Allergic conjunctivitis 01/23/2018  . Chronic sinusitis 01/23/2018  . Gout 04/06/2012  . DIVERTICULITIS-COLON 06/18/2010  . FACIAL FLUSHING 12/11/2007  . NOCTURIA 12/11/2007  . Type 2 diabetes mellitus with hyperlipidemia ([place]) 09/07/2007  . OTHER MALAISE AND FATIGUE 09/07/2007  . Allergic rhinitis 08/16/2007  . HLD (hyperlipidemia) 01/24/2007  . OBESITY 01/24/2007  . ERECTILE DYSFUNCTION 01/24/2007  . HYPERTENSION 01/24/2007  . Coronary atherosclerosis 01/24/2007  . GERD 01/24/2007  . DIVERTICULOSIS, COLON 01/24/2007  . IRRITABLE BOWEL SYNDROME 01/24/2007  . COLONIC POLYPS, HX OF 01/24/2007  . CHOLECYSTECTOMY, HX OF 01/24/2007    Past Surgical History:  Procedure Laterality Date  . ADENOIDECTOMY    . CARPAL TUNNEL RELEASE     left   . CHOLECYSTECTOMY    . COLON RESECTION     18 inches  . COLON SURGERY  2013  . CORONARY ANGIOPLASTY     1985   .  CORONARY ARTERY BYPASS GRAFT N/A 04/26/2019   Procedure: CORONARY ARTERY BYPASS GRAFTING (CABG) x Three, using left internal mammary artery and right leg greater saphenous vein harvested endoscopically;  Surgeon: Gaye Pollack, MD;  Location: [facility];  Service: Open Heart Surgery;  Laterality: N/A;  . ELBOW BURSA SURGERY    . EYE SURGERY    . POLYPECTOMY    . RIGHT/LEFT HEART CATH AND CORONARY ANGIOGRAPHY N/A 04/16/2019   Procedure: RIGHT/LEFT HEART CATH AND CORONARY ANGIOGRAPHY;  Surgeon: Belva Crome, MD;  Location: [facility];  Service: Cardiovascular;  Laterality: N/A;  .  SHOULDER SURGERY     Rt. Shoulder  . TEE WITHOUT CARDIOVERSION N/A 04/26/2019   Procedure: TRANSESOPHAGEAL ECHOCARDIOGRAM (TEE);  Surgeon: Gaye Pollack, MD;  Location: [facility];  Service: Open Heart Surgery;  Laterality: N/A;  . TONSILLECTOMY         Family History  Problem Relation Age of Onset  . Lung cancer Father   . Allergic rhinitis Father   . Coronary artery disease Mother   . Colon cancer Neg Hx   . Heart attack Neg Hx   . Hypertension Neg Hx   . Stroke Neg Hx   . Esophageal cancer Neg Hx   . Rectal cancer Neg Hx   . Stomach cancer Neg Hx   . Pancreatic cancer Neg Hx     Social History   Tobacco Use  . Smoking status: Former Smoker    Quit date: 08/30/1983    Years since quitting: 36.8  . Smokeless tobacco: Former Systems developer  . Tobacco comment: Quit 30 years ago.  Vaping Use  . Vaping Use: Never used  Substance Use Topics  . Alcohol use: Yes    Comment: rare  . Drug use: No    Home Medications Prior to Admission medications   Medication Sig Start Date End Date Taking? Authorizing Provider  acetaminophen (TYLENOL) 500 MG tablet Take 1,000 mg by mouth at bedtime as needed for mild pain.   Yes [provider]  Ascorbic Acid (VITAMIN C) 100 MG tablet Take 100 mg by mouth daily.   Yes [provider]  aspirin EC 81 MG tablet Take 1 tablet (81 mg total) by mouth daily. 05/16/19  Yes Dunn, Dayna N, PA-C  carvedilol (COREG) 3.125 MG tablet TAKE 1 TABLET (3.125 MG TOTAL) BY MOUTH 2 (TWO) TIMES DAILY WITH A MEAL. 06/10/20  Yes [name], MD  citalopram (CELEXA) 10 MG tablet Take 1 tablet (10 mg total) by mouth daily. 03/05/20  Yes [name], MD  colestipol (COLESTID) 5 g packet Take 5 g by mouth daily.    Yes [provider]  fexofenadine (ALLEGRA) 180 MG tablet Take 1 tablet (180 mg total) by mouth daily as needed. For allergies 05/28/18  Yes Bobbitt, Sedalia Muta, MD  finasteride (PROSCAR) 5 MG tablet Take 1 tablet (5 mg total) by mouth  daily. 03/05/20  Yes [name], MD  fluticasone (FLONASE) 50 MCG/ACT nasal spray SPRAY 2 SPRAYS INTO EACH NOSTRIL EVERY DAY Patient taking differently: Place 2 sprays into both nostrils daily.  04/15/19  Yes PickardCammie Mcgee, MD  JARDIANCE 25 MG TABS tablet TAKE 25 MG (1 TABLET) BY MOUTH DAILY BEFORE BREAKFAST. STOP PIOGLITAZONE Patient taking differently: Take 25 mg by mouth daily.  06/01/20  Yes [name], MD  ketotifen (ZADITOR) 0.025 % ophthalmic solution Place 1 drop into both eyes daily as needed (For itching).   Yes [provider]  losartan (COZAAR) 25 MG tablet Take 1 tablet (25 mg total) by mouth daily. 04/13/20  Yes Weaver, Scott T, PA-C  loteprednol (LOTEMAX) 0.5 % ophthalmic suspension Place 1 drop into both eyes 4 (four) times daily. Patient taking differently: Place 1 drop into both eyes 4 (four) times daily as needed (itching).  09/03/19  Yes [name], MD  pantoprazole (PROTONIX) 40 MG tablet TAKE 1 TABLET (40 MG TOTAL) BY MOUTH EVERY MORNING. 10/04/18  Yes [name], MD  rosuvastatin (CRESTOR) 40 MG tablet TAKE 1 TABLET (40 MG TOTAL) BY MOUTH DAILY AT 6 PM. 06/10/20  Yes [name], MD  sitaGLIPtin (JANUVIA) 50 MG tablet Take 1 tablet (50 mg total) by mouth daily. 05/05/20  Yes [name], MD  vitamin B-12 (CYANOCOBALAMIN) 500 MCG tablet Take 500 mcg by mouth daily.   Yes [provider]  clopidogrel (PLAVIX) 75 MG tablet TAKE 1 TABLET BY MOUTH EVERY DAY Patient not taking: Reported on 06/19/2020 05/27/20   Dorothy Spark, MD  Hydrocortisone Acetate 2.5 % CREA Apply 1 application topically in the morning and at bedtime. Patient not taking: Reported on 06/19/2020 05/05/20   [name], MD  nystatin cream (MYCOSTATIN) Apply 1 application topically 3 (three) times daily. Patient not taking: Reported on 06/19/2020 05/13/19   [name], MD    Allergies    Doxazosin mesylate, Flomax [tamsulosin hcl], Penicillins,  Sulfonamide derivatives, and Trulicity [dulaglutide]  Review of Systems   Review of Systems  Constitutional: Positive for fatigue. Negative for chills and fever.  HENT: Negative for ear pain and sore throat.   Eyes: Negative for pain and visual disturbance.  Respiratory: Negative for cough and shortness of breath.   Cardiovascular: Positive for palpitations. Negative for chest pain.  Gastrointestinal: Positive for nausea. Negative for abdominal pain and vomiting.  Genitourinary: Negative for dysuria and hematuria.  Musculoskeletal: Negative for arthralgias and back pain.  Skin: Negative for color change and rash.  Neurological: Negative for seizures and syncope.  All other systems reviewed and are negative.   Physical Exam Updated Vital Signs BP 109/76   Pulse (!) 174   Temp (!) 96.3 F (35.7 C) (Temporal)   Resp (!) 22   Ht 5\' 11"  (1.803 m)   Wt 98 kg   SpO2 100%   BMI 30.13 kg/m   Physical Exam Vitals and nursing note reviewed.  Constitutional:      Appearance: He is well-developed. He is obese. He is ill-appearing (appears chronically ill). He is not toxic-appearing or diaphoretic.  HENT:     Head: Normocephalic and atraumatic.     Mouth/Throat:     Mouth: Mucous membranes are dry.     Pharynx: Oropharynx is clear.  Eyes:     Conjunctiva/sclera: Conjunctivae normal.     Pupils: Pupils are equal, round, and reactive to light.  Cardiovascular:     Rate and Rhythm: Regular rhythm. Tachycardia present.     Heart sounds: No murmur heard.  No gallop.   Pulmonary:     Effort: Pulmonary effort is normal. No respiratory distress.     Breath sounds: Normal breath sounds.  Abdominal:     Palpations: Abdomen is soft.     Tenderness: There is no abdominal tenderness.  Musculoskeletal:     Cervical back: Neck supple.     Right lower leg: No edema.     Left lower leg: No edema.  Skin:    General: Skin  is warm and dry.  Neurological:     Mental Status: He is alert and  oriented to person, place, and time.     ED Results / Procedures / Treatments   Labs (all labs ordered are listed, but only abnormal results are displayed) Labs Reviewed  CBC WITH DIFFERENTIAL/PLATELET - Abnormal; Notable for the following components:      Result Value   WBC 11.3 (*)    All other components within normal limits  BASIC METABOLIC PANEL - Abnormal; Notable for the following components:   Glucose, Bld 199 (*)    All other components within normal limits  MAGNESIUM - Abnormal; Notable for the following components:   Magnesium 1.6 (*)    All other components within normal limits  TROPONIN I (HIGH SENSITIVITY) - Abnormal; Notable for the following components:   Troponin I (High Sensitivity) 237 (*)    All other components within normal limits  RESPIRATORY PANEL BY RT PCR (FLU A&B, COVID)  TROPONIN I (HIGH SENSITIVITY)    EKG EKG Interpretation  Date/Time:  Friday June 19 2020 08:34:13 EDT Ventricular Rate:  183 PR Interval:    QRS Duration: 149 QT Interval:  295 QTC Calculation: 515 R Axis:   -109 Text Interpretation: Extreme tachycardia with wide complex, Confirmed by Lennice Sites [phone]) on 06/19/2020 9:17:01 AM   Radiology DG Chest Portable 1 View  Result Date: 06/19/2020 CLINICAL DATA:  Ventricular tachycardia. EXAM: PORTABLE CHEST 1 VIEW COMPARISON:  May 27, 2019. FINDINGS: The heart size and mediastinal contours are within normal limits. Status post coronary bypass graft. No pneumothorax or pleural effusion is noted. Both lungs are clear. The visualized skeletal structures are unremarkable. IMPRESSION: No active disease. Electronically Signed   By: Marijo Conception M.D.   On: 06/19/2020 09:00    Procedures Procedures (including critical care time)  Medications Ordered in ED Medications  amiodarone (NEXTERONE PREMIX) 360-4.14 MG/200ML-% (1.8 mg/mL) IV infusion (has no administration in time range)  amiodarone (NEXTERONE) 1.8 mg/mL load via  infusion 150 mg (150 mg Intravenous Bolus from Bag 06/19/20 0839)    Followed by  amiodarone (NEXTERONE PREMIX) 360-4.14 MG/200ML-% (1.8 mg/mL) IV infusion (60 mg/hr Intravenous Rate/Dose Change 06/19/20 0839)    Followed by  amiodarone (NEXTERONE PREMIX) 360-4.14 MG/200ML-% (1.8 mg/mL) IV infusion (has no administration in time range)  etomidate (AMIDATE) 2 MG/ML injection (has no administration in time range)  sodium chloride flush (NS) 0.9 % injection 3 mL (has no administration in time range)  magnesium sulfate IVPB 2 g 50 mL (0 g Intravenous Stopped 06/19/20 1110)    ED Course  I have reviewed the triage vital signs and the nursing notes.  Pertinent labs & imaging results that were available during my care of the patient were reviewed by me and considered in my medical decision making (see chart for details).    MDM Rules/Calculators/A&P                          The patient is a 74yo male, PMH CAD s/p CABG HF last EF 45 to 50%, who presents to the ED via EMS from PCPs office for ventricular tachycardia with a pulse.  On my initial evaluation, the patient is tachycardic to 180 but normotensive, afebrile, nontoxic-appearing. Physical exam remarkable for regular tachycardia with no rales, BLE edema, but with dry mucous membranes.  Cardiology consulted. EMS had provided 300 mg amiodarone. Provided an additional 150 mg bolus of amiodarone and  500 mL bolus of IV fluids. Differentials considered include ACS, electrolyte abnormality. EKG with ventricular tachycardia.   Cardiology recommending admission to their service. Patient provided IV magnesium for hypomagnesemia, which may have precipitated v-tach. Labs otherwise remarkable for elevated troponin, likely related to demand ischemia from tachycardic rate.  CXR unremarkable.  Amiodarone with no effect on tachycardia.  Cardiology recommended cardioversion.  Patient and wife at bedside consented for sedation and cardioversion.  Please see Dr.  Sanjuana Kava note for further details regarding procedures.  Patient tolerated well, went from ventricular tachycardia to sinus rhythm after cardioversion.  Cardiology reported they would admit, asked for patient to be kept n.p.o. for cath later on today.  Cardiology did not recommend initiation of heparin at this time for the troponinemia.  Patient admitted to cardiology EP service with no further acute events while under my care.  The care of this patient was overseen by Dr. Ronnald Nian, who agreed with evaluation and plan of care.   Final Clinical Impression(s) / ED Diagnoses Final diagnoses:  Ventricular tachycardia [facility])    Rx / DC Orders ED Discharge Orders    None       Launa Flight, MD 06/19/20 [place], [place], DO 06/19/20 1249

## 2020-06-19 NOTE — Progress Notes (Signed)
Site area: Rt Fem Art 5FR Site Prior to Removal:  Level 0 Pressure Applied For: 46min Manual:   yes Patient Status During Pull:  A/O Post Pull Site:  Level ) Post Pull Instructions Given:  Post instructions given and pt understands. Post Pull Pulses Present: 2+ Rt Dp/Pt Dressing Applied:  Tegaderm and a 4x4 Bedrest begins @ 15:00:00 Comments: Pt leaves cath lab holding area in stable condition. Rt groin is unremarkable and dressing is CDI.

## 2020-06-19 NOTE — ED Notes (Signed)
Cardiology at bedside.

## 2020-06-19 NOTE — ED Provider Notes (Signed)
I have personally seen and examined the patient. I have reviewed the documentation on PMH/FH/Soc Hx. I have discussed the plan of care with the resident and patient.  I have reviewed and agree with the resident's documentation. Please see associated encounter note.  Briefly, the patient is a 74 y.o. male here with weakness.  Went to primary care doctor's office and was found to be in V. tach.  Blood pressure and other vital signs are normal at this time but heart rate in the 170s.  He does not have chest pain or shortness of breath.  Blood pressure is 123/78.  Had been given amiodarone for V. tach by EMS.  Continues to be in what looks like ventricular tachycardia.  Will bolus amiodarone and start infusion.  Cardiology has been consulted he recommends the same at this time.  They will admit the patient and come down to the ED to evaluate and give further recommendations.  Will hold on any cardioversion at this time given that patient is stable.  Chest x-ray shows no signs of infectious process.  No signs of volume overload.  Magnesium is 1.6 and will replete magnesium.  Awaiting remaining lab work but anticipate admission to cardiology. Trop elevated.  Cardiology recommended cardioversion as patient was not responding to amiodarone.  Etomidate sedation was performed and patient was cardioverted successfully to normal sinus rhythm.  Patient to be admitted to cardiology for further ischemic work-up.  .Critical Care Performed by: Lennice Sites, DO Authorized by: Lennice Sites, DO   Critical care provider statement:    Critical care time (minutes):  45   Critical care was necessary to treat or prevent imminent or life-threatening deterioration of the following conditions:  Cardiac failure   Critical care was time spent personally by me on the following activities:  Blood draw for specimens, development of treatment plan with patient or surrogate, discussions with primary provider, examination of patient,  evaluation of patient's response to treatment, obtaining history from patient or surrogate, ordering and performing treatments and interventions, ordering and review of laboratory studies, ordering and review of radiographic studies, pulse oximetry, re-evaluation of patient's condition and review of old charts   I assumed direction of critical care for this patient from another provider in my specialty: no   .Sedation  Date/Time: 06/19/2020 11:20 AM Performed by: Lennice Sites, DO Authorized by: Lennice Sites, DO   Consent:    Consent obtained:  Verbal, written and emergent situation   Consent given by:  Patient   Risks discussed:  Allergic reaction, dysrhythmia, inadequate sedation, nausea, prolonged hypoxia resulting in organ damage, prolonged sedation necessitating reversal, respiratory compromise necessitating ventilatory assistance and intubation and vomiting   Alternatives discussed:  Anxiolysis Universal protocol:    Procedure explained and questions answered to patient or proxy's satisfaction: yes     Relevant documents present and verified: yes     Test results available and properly labeled: yes     Immediately prior to procedure a time out was called: yes     Patient identity confirmation method:  Arm band and verbally with patient Indications:    Procedure performed:  Cardioversion   Procedure necessitating sedation performed by:  Physician performing sedation Pre-sedation assessment:    Time since last food or drink:  Hours   ASA classification: class 2 - patient with mild systemic disease     Neck mobility: normal     Mouth opening:  3 or more finger widths   Thyromental distance:  4 finger  widths   Mallampati score:  II - soft palate, uvula, fauces visible   Pre-sedation assessments completed and reviewed: airway patency, cardiovascular function, hydration status, mental status, nausea/vomiting, pain level, respiratory function and temperature     Pre-sedation  assessment completed:  06/19/2020 11:00 AM Immediate pre-procedure details:    Reassessment: Patient reassessed immediately prior to procedure     Reviewed: vital signs, relevant labs/tests and NPO status     Verified: bag valve mask available, emergency equipment available, intubation equipment available, IV patency confirmed, oxygen available, reversal medications available and suction available   Procedure details (see MAR for exact dosages):    Preoxygenation:  Nasal cannula   Sedation:  Etomidate   Intended level of sedation: moderate (conscious sedation)   Intra-procedure monitoring:  Blood pressure monitoring, cardiac monitor, continuous capnometry, continuous pulse oximetry, frequent LOC assessments and frequent vital sign checks   Intra-procedure events: none     Total Provider sedation time (minutes):  20 Post-procedure details:    Post-sedation assessment completed:  06/19/2020 11:38 AM   Attendance: Constant attendance by certified staff until patient recovered     Recovery: Patient returned to pre-procedure baseline     Post-sedation assessments completed and reviewed: airway patency, cardiovascular function, hydration status, mental status, nausea/vomiting, pain level, respiratory function and temperature     Patient is stable for discharge or admission: yes     Patient tolerance:  Tolerated well, no immediate complications .Cardioversion  Date/Time: 06/19/2020 11:21 AM Performed by: Lennice Sites, DO Authorized by: Lennice Sites, DO   Consent:    Consent obtained:  Verbal, written and emergent situation   Consent given by:  Patient   Risks discussed:  Cutaneous burn, death, induced arrhythmia and pain   Alternatives discussed:  Rate-control medication Pre-procedure details:    Cardioversion basis:  Emergent   Rhythm:  Ventricular tachycardia   Electrode placement:  Anterior-posterior Patient sedated: Yes. Refer to sedation procedure documentation for details of  sedation.  Attempt one:    Cardioversion mode:  Synchronous   Waveform:  Biphasic   Shock (Joules):  100   Shock outcome:  Conversion to normal sinus rhythm Post-procedure details:    Patient status:  Awake   Patient tolerance of procedure:  Tolerated well, no immediate complications     EKG Interpretation  Date/Time:  Friday June 19 2020 08:34:13 EDT Ventricular Rate:  183 PR Interval:    QRS Duration: 149 QT Interval:  295 QTC Calculation: 515 R Axis:   -109 Text Interpretation: Extreme tachycardia with wide complex, Confirmed by Lennice Sites [phone]) on 06/19/2020 9:17:01 AM         Lennice Sites, DO 06/19/20 1140

## 2020-06-19 NOTE — Progress Notes (Signed)
Patient drove to my office this morning at around 730 stating that he felt like his heart was racing.  Heart rate was 195 bpm.  Pulse oximetry was 91% on room air blood pressure was 100/60.  Patient denied any chest pain but he did report feeling dizzy and weak.  He denied any syncope or near syncope.  He states that his heart been racing since yesterday evening.  He did not sleep well through the night and his heart had maintain a rapid rhythm throughout the evening.  EMS was immediately contacted.  Patient was placed on 4 L.  EMS arrived quickly on scene.  Based on rhythm strip, the patient appears to be in a wide-complex tachycardia at 195 bpm.  Differential diagnosis would be A. fib with RVR versus ventricular tachycardia.  EMS started the patient on amiodarone drip and the patient was taken to the emergency room.

## 2020-06-19 NOTE — H&P (Addendum)
ELECTROPHYSIOLOGY H&P NOTE    Patient ID: OMAR GAYDEN MRN: [id number], DOB/AGE: Nov 07, 1945 74 y.o.  Admit date: 06/19/2020 Date of Consult: 06/19/2020  Primary Physician: [name], MD Primary Cardiologist: [name], MD  Electrophysiologist: New  Reason for admission: Ventricular Tachycardia  Patient Profile: OTHER ATIENZA is a 74 y.o. male with a history of chronic systolic CHF (most recent EF 45-50% 02/2020), CAD s/p CABG 03/2019, HTN, DM2, HLD who is being seen today for the evaluation of VT at the request of Dr. Harrington Challenger.  HPI:  TRAMEL WESTBROOK is a 74 y.o. male with medical history as above.   Pt previously seen 03/2019 in ED with "feeling bad" and fever. Underwent cath as below which severe 3vD and ended up getting CABG x 3 04/26/2019.  Last seen in [facility] office 04/13/2020. He was doing well at that time with NYHA II symptoms and stable volume status. He had previously been taken off of olmesartan 2/2 hypotension, and was tried back on losartan.   Pt presented to PCP office this am around 0730 stating that his heart was racing. Pulse ox there showed HRs in 190s with BP 100/60 and SPo2 at 91%. He denied chest pain. Did report feeling dizzy and weak. He denies any syncope or near syncope. His heart started racing last night. EMS arrived and noted WCT. Given amiodarone bolus and started on gtt.   EP asked to see as on arrival noted to be in a wide complex tachycardia in the 170s.   Pt currently awake and alert, and overall without complaint. He noted a fast HR on his BP cuff last night, which he checks most nights.  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 He felt slightly fatigued and "wobbly on his feet", but otherwise stable. He denies chest pain, syncope, tachy-palpitations, or dyspnea.    LHC 04/16/19 - Severe three-vessel coronary artery disease involving the ostial proximal and mid LAD, the mid RCA, and the mid and distal  circumflex.  There is moderate but not angiographically significant left main disease. - Global hypokinesis with reduced EF less than 30% has been noted by echocardiography. Overall picture is consistent with ischemic cardiomyopathy, possibly worsened EF with stunned myocardium in the setting of severe three-vessel CAD and increased demand caused by pneumonia and intubation. - Hemodynamics suggest that volume status is stable.  Cardiac output is low due to left ventricular hypocontractility.  RECOMMENDATIONS:   Strong consideration for surgical revascularization once medical status is improved.  Guideline directed therapy for left ventricular systolic dysfunction which should also include anti-ischemic therapy.  Preventive therapy with high intensity statin therapy.  Consider advanced heart failure team assessment to help optimize clinical condition.  Debate whether viability imaging is necessary.   Past Medical History:  Diagnosis Date  . Allergic rhinitis, cause unspecified   . Allergy   . Anxiety   . Arthritis   . Cancer ([place])   . Carotid Doppler 04/2020   Carotid US 9/21: No evidence of ICA stenosis bilaterally; right vertebral artery stenosis  . Cataract   . Chronic systolic CHF (congestive heart failure) ([place])   . Coronary atherosclerosis of unspecified type of vessel, native or graft    a. MI 1985 with unclear details. b. CABG 03/2019 with LIMA -> LAD, SVG -> OM, SVG to dRCA.  . Depression    PTSD   . Diverticulosis of colon (without mention of hemorrhage)   . Esophageal reflux   .  Esophageal stricture   . Essential hypertension   . Former tobacco use   . Hemorrhoids   . Hyperlipidemia   . Irritable bowel syndrome   . Ischemic cardiomyopathy   . Mild carotid artery disease (HCC)    a. 1-39% bilaterally 03/2019 duplex.  . Myocardial infarction ([place])   . Nocturia   . Obesity, unspecified   . Orthostasis   . Other acquired absence of organ   . Personal history of  colonic polyps   . Psychosexual dysfunction with inhibited sexual excitement   . Retroperitoneal bleed   . Type II or unspecified type diabetes mellitus without mention of complication, not stated as uncontrolled      Surgical History:  Past Surgical History:  Procedure Laterality Date  . ADENOIDECTOMY    . CARPAL TUNNEL RELEASE     left   . CHOLECYSTECTOMY    . COLON RESECTION     18 inches  . COLON SURGERY  2013  . CORONARY ANGIOPLASTY     1985   . CORONARY ARTERY BYPASS GRAFT N/A 04/26/2019   Procedure: CORONARY ARTERY BYPASS GRAFTING (CABG) x Three, using left internal mammary artery and right leg greater saphenous vein harvested endoscopically;  Surgeon: Gaye Pollack, MD;  Location: [facility];  Service: Open Heart Surgery;  Laterality: N/A;  . ELBOW BURSA SURGERY    . EYE SURGERY    . POLYPECTOMY    . RIGHT/LEFT HEART CATH AND CORONARY ANGIOGRAPHY N/A 04/16/2019   Procedure: RIGHT/LEFT HEART CATH AND CORONARY ANGIOGRAPHY;  Surgeon: Belva Crome, MD;  Location: [facility];  Service: Cardiovascular;  Laterality: N/A;  . SHOULDER SURGERY     Rt. Shoulder  . TEE WITHOUT CARDIOVERSION N/A 04/26/2019   Procedure: TRANSESOPHAGEAL ECHOCARDIOGRAM (TEE);  Surgeon: Gaye Pollack, MD;  Location: [facility];  Service: Open Heart Surgery;  Laterality: N/A;  . TONSILLECTOMY       (Not in a hospital admission)   Inpatient Medications:  . amiodarone  150 mg Intravenous Once    Allergies:  Allergies  Allergen Reactions  . Doxazosin Mesylate     UNKNOWN REACTION  . Flomax [Tamsulosin Hcl] Itching       . Penicillins Rash and Other (See Comments)    Has patient had a PCN reaction causing immediate rash, facial/tongue/throat swelling, SOB or lightheadedness with hypotension: No Has patient had a PCN reaction causing severe rash involving mucus membranes or skin necrosis: No Has patient had a PCN reaction that required hospitalization No Has patient had a PCN reaction occurring  within the last 10 years: No If all of the above answers are "NO", then may proceed with Cephalosporin use.   . Sulfonamide Derivatives Rash  . Trulicity [Dulaglutide] Nausea And Vomiting    N&V, Dizziness    Social History   Socioeconomic History  . Marital status: Married    Spouse name: Not on file  . Number of children: 1  . Years of education: 75  . Highest education level: High school graduate  Occupational History  . 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 Smoking status: Former Smoker    Quit date: 08/30/1983    Years since quitting: 36.8  . Smokeless tobacco: Former Systems developer  . Tobacco comment: Quit 30 years ago.  Vaping Use  . Vaping Use: Never used  Substance and Sexual Activity  . Alcohol use: Yes    Comment: rare  . Drug use: No  . Sexual activity: Yes  Other Topics  Concern  . Not on file  Social History Narrative  . Not on file   Social Determinants of Health   Financial Resource Strain:   . Difficulty of Paying Living Expenses: Not on file  Food Insecurity:   . Worried About Charity fundraiser in the Last Year: Not on file  . Ran Out of Food in the Last Year: Not on file  Transportation Needs:   . Lack of Transportation (Medical): Not on file  . Lack of Transportation (Non-Medical): Not on file  Physical Activity:   . Days of Exercise per Week: Not on file  . Minutes of Exercise per Session: Not on file  Stress:   . Feeling of Stress : Not on file  Social Connections:   . Frequency of Communication with Friends and Family: Not on file  . Frequency of Social Gatherings with Friends and Family: Not on file  . Attends Religious Services: Not on file  . Active Member of Clubs or Organizations: Not on file  . Attends Archivist Meetings: Not on file  . Marital Status: Not on file  Intimate Partner Violence:   . Fear of Current or Ex-Partner: Not on file  . Emotionally Abused: Not on file  . Physically Abused: Not on file  . Sexually Abused: Not  on file     Family History  Problem Relation Age of Onset  . Lung cancer Father   . Allergic rhinitis Father   . Coronary artery disease Mother   . Colon cancer Neg Hx   . Heart attack Neg Hx   . Hypertension Neg Hx   . Stroke Neg Hx   . Esophageal cancer Neg Hx   . Rectal cancer Neg Hx   . Stomach cancer Neg Hx   . Pancreatic cancer Neg Hx      Review of Systems: All other systems reviewed and are otherwise negative except as noted above.  Physical Exam: Vitals:   06/19/20 0835 06/19/20 0838 06/19/20 0853  BP: (!) 112/91    Pulse: (!) 184    Resp: 18    Temp:  (!) 96.3 F (35.7 C)   TempSrc:  Temporal   SpO2: 97%    Weight:   98 kg  Height:   5\' 11"  (1.803 m)    GEN- The patient is well appearing, alert and oriented x 3 today.   HEENT: normocephalic, atraumatic; sclera clear, conjunctiva pink; hearing intact; oropharynx clear; neck supple Lungs- Clear to ausculation bilaterally, normal work of breathing.  No wheezes, rales, rhonchi Heart- Regular rate and rhythm, no murmurs, rubs or gallops GI- soft, non-tender, non-distended, bowel sounds present Extremities- no clubbing, cyanosis, or edema; DP/PT/radial pulses 2+ bilaterally MS- no significant deformity or atrophy Skin- warm and dry, no rash or lesion Psych- euthymic mood, full affect Neuro- strength and sensation are intact  Labs:   Lab Results  Component Value Date   WBC 11.3 (H) 06/19/2020   HGB 16.4 06/19/2020   HCT 49.9 06/19/2020   MCV 87.5 06/19/2020   PLT 166 06/19/2020   No results for input(s): NA, K, CL, CO2, BUN, CREATININE, CALCIUM, PROT, BILITOT, ALKPHOS, ALT, AST, GLUCOSE in the last 168 hours.  Invalid input(s): LABALBU    Radiology/Studies: No results found.  EKG: WCT at 183 bpm (personally reviewed)  TELEMETRY: WCT 170-180s (personally reviewed)  Assessment/Plan: 1.  Monomorphic VT Hemodynamically stable, likely in the setting of previous scar vs ischemia Agree with amiodarone.  Rate has improved.  If pt decompensates will need to consider urgent cardioversion. He has not had food or drink today. Keep NPO.  COVID negative.  Goal K >4.0 and Mg> 1.9. K 4.4 and Mg 1.6 (Mg ordered)  2. CAD s/p CABG 12/7844 (complicatd by large retroperitoneal bleed post cath) Initial HSTrop 237. Denies chest pain, but did not have prior to his CABG either  It is unclear if he has an anginal equivalent, as he had pneumonia as well during his initial episode in 03/2019. He is relatively active at baseline working in [employer], and occasionally has mild DOE walking up hills.   Will review with MD for further. Pt will likely need ischemic work up +/- ICD pending repeat Echo (once in sinus). ? Candidacy for VT ablation  ADDENDUM Dr. Quentin Ore has seen.  Pt now s/p DCC to NSR. No ST elevation noted on post defib EKG.   Plan cath today for ischemic work up. If no target lesion, consider VT ablation and ICD next week. If CAD worsened, will plan accordingly.   For questions or updates, please contact [place] Please consult [email or web address] for contact info under Cardiology/STEMI.  Jacalyn Lefevre, PA-C  06/19/2020 8:58 AM

## 2020-06-19 NOTE — ED Notes (Signed)
EMS gave 300 mg of amiodarone

## 2020-06-19 NOTE — ED Triage Notes (Signed)
Pt here from [place] office with c/o V tach 180's pt is c/o nausea and dizziness

## 2020-06-19 NOTE — Sedation Documentation (Signed)
Pt shocked at 200J by Dr Ronnald Nian

## 2020-06-19 NOTE — Interval H&P Note (Signed)
Cath Lab Visit (complete for each Cath Lab visit)  Clinical Evaluation Leading to the Procedure:   ACS: No.  Non-ACS:    Anginal Classification: CCS II  Anti-ischemic medical therapy: Minimal Therapy (1 class of medications)  Non-Invasive Test Results: No non-invasive testing performed  Prior CABG: Previous CABG      History and Physical Interval Note:  06/19/2020 1:26 PM  Thornton Papas  has presented today for surgery, with the diagnosis of CAD.  The various methods of treatment have been discussed with the patient and family. After consideration of risks, benefits and other options for treatment, the patient has consented to  Procedure(s): LEFT HEART CATH AND CORS/GRAFTS ANGIOGRAPHY (N/A) as a surgical intervention.  The patient's history has been reviewed, patient examined, no change in status, stable for surgery.  I have reviewed the patient's chart and labs.  Questions were answered to the patient's satisfaction.     Shelva Majestic

## 2020-06-19 NOTE — Progress Notes (Signed)
Patient monitored with q 15-30 minute groin checks. No change in assessment. Right groin cath site  Level 0 with no bleeding, hematoma, swelling or bruising noted. Patient denies any flank pain or any pain during this time. Checked at 1900. Report to Vita Erm RN

## 2020-06-20 ENCOUNTER — Other Ambulatory Visit (HOSPITAL_COMMUNITY): Payer: Medicare HMO

## 2020-06-20 ENCOUNTER — Inpatient Hospital Stay (HOSPITAL_COMMUNITY): Payer: Medicare HMO

## 2020-06-20 DIAGNOSIS — I472 Ventricular tachycardia: Secondary | ICD-10-CM | POA: Diagnosis not present

## 2020-06-20 DIAGNOSIS — I25119 Atherosclerotic heart disease of native coronary artery with unspecified angina pectoris: Secondary | ICD-10-CM | POA: Diagnosis not present

## 2020-06-20 LAB — BASIC METABOLIC PANEL
Anion gap: 9 (ref 5–15)
BUN: 17 mg/dL (ref 8–23)
CO2: 28 mmol/L (ref 22–32)
Calcium: 9.5 mg/dL (ref 8.9–10.3)
Chloride: 100 mmol/L (ref 98–111)
Creatinine, Ser: 1.06 mg/dL (ref 0.61–1.24)
GFR, Estimated: >60 mL/min (ref >60)
Glucose, Bld: 121 mg/dL — ABNORMAL HIGH (ref 70–99)
Potassium: 4.2 mmol/L (ref 3.5–5.1)
Sodium: 137 mmol/L (ref 135–145)

## 2020-06-20 LAB — GLUCOSE, CAPILLARY
Glucose-Capillary: 137 mg/dL — ABNORMAL HIGH (ref 70–99)
Glucose-Capillary: 162 mg/dL — ABNORMAL HIGH (ref 70–99)
Glucose-Capillary: 152 mg/dL — ABNORMAL HIGH (ref 70–99)
Glucose-Capillary: 145 mg/dL — ABNORMAL HIGH (ref 70–99)

## 2020-06-20 LAB — ECHOCARDIOGRAM COMPLETE
Area-P 1/2: 3.72 cm2
Height: 71 in
S' Lateral: 3.6 cm
Weight: 3471.27 oz

## 2020-06-20 LAB — CBC
HCT: 44.9 % (ref 39.0–52.0)
Hemoglobin: 14.6 g/dL (ref 13.0–17.0)
MCH: 28.9 pg (ref 26.0–34.0)
MCHC: 32.5 g/dL (ref 30.0–36.0)
MCV: 88.9 fL (ref 80.0–100.0)
Platelets: 122 10*3/uL — ABNORMAL LOW (ref 150–400)
RBC: 5.05 MIL/uL (ref 4.22–5.81)
RDW: 14.1 % (ref 11.5–15.5)
WBC: 7.3 10*3/uL (ref 4.0–10.5)
nRBC: 0 % (ref 0.0–0.2)

## 2020-06-20 LAB — MAGNESIUM: Magnesium: 1.8 mg/dL (ref 1.7–2.4)

## 2020-06-20 MED ORDER — PERFLUTREN LIPID MICROSPHERE
1.0000 mL | INTRAVENOUS | Status: AC | PRN
  Administered 2020-06-20: 2 mL via INTRAVENOUS
  Filled 2020-06-20: qty 10

## 2020-06-20 MED ORDER — COLESTIPOL HCL 5 G PO PACK
5.0000 g | PACK | Freq: Every day | ORAL | Status: DC
  Filled 2020-06-20: qty 1

## 2020-06-20 MED ORDER — COLESTIPOL HCL 1 G PO TABS
2.0000 g | ORAL_TABLET | Freq: Two times a day (BID) | ORAL | Status: DC
  Administered 2020-06-20 – 2020-06-26 (×12): 2 g via ORAL
  Filled 2020-06-20 (×13): qty 2

## 2020-06-20 NOTE — Progress Notes (Signed)
Progress Note   Subjective   Doing well today, the patient denies CP or SOB.  No new concerns  Inpatient Medications    Scheduled Meds: . aspirin EC  81 mg Oral Daily  . carvedilol  3.125 mg Oral BID WC  . citalopram  10 mg Oral q1800  . colestipol  2 g Oral BID  . empagliflozin  25 mg Oral Daily  . enoxaparin (LOVENOX) injection  40 mg Subcutaneous Q24H  . finasteride  5 mg Oral Daily  . linagliptin  5 mg Oral Daily  . losartan  25 mg Oral q morning - 10a  . pantoprazole  40 mg Oral q morning - 10a  . rosuvastatin  40 mg Oral q1800  . sodium chloride flush  3 mL Intravenous Q12H  . sodium chloride flush  3 mL Intravenous Q12H  . vitamin B-12  500 mcg Oral Daily   Continuous Infusions: . sodium chloride 10 mL/hr at 06/19/20 1225  . sodium chloride    . amiodarone 30 mg/hr (06/20/20 0713)   PRN Meds: sodium chloride, acetaminophen, fluticasone, loratadine, ondansetron (ZOFRAN) IV, sodium chloride flush   Vital Signs    Vitals:   06/19/20 1910 06/19/20 2337 06/20/20 0342 06/20/20 0745  BP: 102/73 110/68 112/67   Pulse:      Resp: 18 18 18 18   Temp: 98 F (36.7 C) 98 F (36.7 C) 98 F (36.7 C) 97.8 F (36.6 C)  TempSrc: Oral Oral Oral Oral  SpO2:      Weight:   98.4 kg   Height:        Intake/Output Summary (Last 24 hours) at 06/20/2020 0845 Last data filed at 06/20/2020 0202 Gross per 24 hour  Intake 679 ml  Output 500 ml  Net 179 ml   Filed Weights   06/19/20 0853 06/20/20 0342  Weight: 98 kg 98.4 kg    Telemetry    sinus - Personally Reviewed  Physical Exam   GEN- The patient is well appearing, alert and oriented x 3 today.   Head- normocephalic, atraumatic Eyes-  Sclera clear, conjunctiva pink Ears- hearing intact Oropharynx- clear Neck- supple, Lungs-  normal work of breathing Heart- Regular rate and rhythm  GI- soft  Extremities- no clubbing, cyanosis, or edema  MS- no significant deformity or atrophy Skin- no rash or  lesion Psych- euthymic mood, full affect Neuro- strength and sensation are intact   Labs    Chemistry Recent Labs  Lab 06/19/20 0833 06/19/20 1535 06/20/20 0252  NA 137  --  137  K 4.4  --  4.2  CL 101  --  100  CO2 23  --  28  GLUCOSE 199*  --  121*  BUN 18  --  17  CREATININE 1.09 0.91 1.06  CALCIUM 10.1  --  9.5  GFRNONAA >60 >60 >60  ANIONGAP 13  --  9     Hematology Recent Labs  Lab 06/19/20 0833 06/19/20 1535 06/20/20 0252  WBC 11.3* 9.1 7.3  RBC 5.70 5.46 5.05  HGB 16.4 15.5 14.6  HCT 49.9 48.0 44.9  MCV 87.5 87.9 88.9  MCH 28.8 28.4 28.9  MCHC 32.9 32.3 32.5  RDW 13.9 14.0 14.1  PLT 166 141* 122*     Patient ID  Cameron Daniel is a 74 y.o. 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  Assessment & Plan    1.  VT The patient presents with ventricular tachycardia and has been started on amiodarone.  He is scheduled for VT ablation on Monday.  Rhythm is currently stable. Echo is pending  2. CAD s/p CABG No ischemic symptoms Cath from yesterday is reviewed.  He has advanced CAD but patent grafts.  Medical therapy is advised Echo is pending  3. HTN Stable No change required today   Thompson Grayer MD, [facility] 06/20/2020 8:45 AM

## 2020-06-21 DIAGNOSIS — I25119 Atherosclerotic heart disease of native coronary artery with unspecified angina pectoris: Secondary | ICD-10-CM | POA: Diagnosis not present

## 2020-06-21 DIAGNOSIS — I472 Ventricular tachycardia: Secondary | ICD-10-CM | POA: Diagnosis not present

## 2020-06-21 LAB — BASIC METABOLIC PANEL
Anion gap: 8 (ref 5–15)
BUN: 17 mg/dL (ref 8–23)
CO2: 27 mmol/L (ref 22–32)
Calcium: 9.4 mg/dL (ref 8.9–10.3)
Chloride: 102 mmol/L (ref 98–111)
Creatinine, Ser: 0.99 mg/dL (ref 0.61–1.24)
GFR, Estimated: >60 mL/min (ref >60)
Glucose, Bld: 131 mg/dL — ABNORMAL HIGH (ref 70–99)
Potassium: 3.6 mmol/L (ref 3.5–5.1)
Sodium: 137 mmol/L (ref 135–145)

## 2020-06-21 MED ORDER — SODIUM CHLORIDE 0.9 % IV SOLN: INTRAVENOUS | Status: DC

## 2020-06-21 MED ORDER — CARVEDILOL 3.125 MG PO TABS
3.1250 mg | ORAL_TABLET | Freq: Two times a day (BID) | ORAL | Status: DC
  Administered 2020-06-22 – 2020-06-23 (×3): 3.125 mg via ORAL
  Filled 2020-06-21 (×3): qty 1

## 2020-06-21 NOTE — Progress Notes (Signed)
Progress Note   Subjective   Doing well today, the patient denies CP or SOB.  No new concerns  Inpatient Medications    Scheduled Meds: . aspirin EC  81 mg Oral Daily  . carvedilol  3.125 mg Oral BID WC  . citalopram  10 mg Oral q1800  . colestipol  2 g Oral BID  . empagliflozin  25 mg Oral Daily  . enoxaparin (LOVENOX) injection  40 mg Subcutaneous Q24H  . finasteride  5 mg Oral Daily  . linagliptin  5 mg Oral Daily  . losartan  25 mg Oral q morning - 10a  . pantoprazole  40 mg Oral q morning - 10a  . rosuvastatin  40 mg Oral q1800  . sodium chloride flush  3 mL Intravenous Q12H  . sodium chloride flush  3 mL Intravenous Q12H  . vitamin B-12  500 mcg Oral Daily   Continuous Infusions: . sodium chloride 10 mL/hr at 06/19/20 1225  . sodium chloride    . amiodarone 30 mg/hr (06/21/20 0618)   PRN Meds: sodium chloride, acetaminophen, fluticasone, loratadine, ondansetron (ZOFRAN) IV, sodium chloride flush   Vital Signs    Vitals:   06/20/20 1417 06/20/20 1917 06/21/20 0250 06/21/20 0800  BP: 111/65 120/67 116/68 120/72  Pulse: 67 72 68 67  Resp: 20 18 18 16   Temp: 97.8 F (36.6 C) 98.7 F (37.1 C) 98.7 F (37.1 C) 97.8 F (36.6 C)  TempSrc: Oral Oral Oral Oral  SpO2: 95% 95% 98% 100%  Weight:   97.8 kg   Height:        Intake/Output Summary (Last 24 hours) at 06/21/2020 1036 Last data filed at 06/21/2020 0900 Gross per 24 hour  Intake 606 ml  Output 1200 ml  Net -594 ml   Filed Weights   06/19/20 0853 06/20/20 0342 06/21/20 0250  Weight: 98 kg 98.4 kg 97.8 kg    Telemetry    No VT - Personally Reviewed  Physical Exam   GEN- The patient is well appearing, alert and oriented x 3 today.   Head- normocephalic, atraumatic Eyes-  Sclera clear, conjunctiva pink Ears- hearing intact Oropharynx- clear Neck- supple, Lungs-  normal work of breathing Heart- Regular rate and rhythm  GI- soft  Extremities- no clubbing, cyanosis, or edema  MS- no  significant deformity or atrophy Skin- no rash or lesion Psych- euthymic mood, full affect Neuro- strength and sensation are intact   Labs    Chemistry Recent Labs  Lab 06/19/20 0833 06/19/20 0833 06/19/20 1535 06/20/20 0252 06/21/20 0338  NA 137  --   --  137 137  K 4.4  --   --  4.2 3.6  CL 101  --   --  100 102  CO2 23  --   --  28 27  GLUCOSE 199*  --   --  121* 131*  BUN 18  --   --  17 17  CREATININE 1.09   < > 0.91 1.06 0.99  CALCIUM 10.1  --   --  9.5 9.4  GFRNONAA >60   < > >60 >60 >60  ANIONGAP 13  --   --  9 8   < > = values in this interval not displayed.     Hematology Recent Labs  Lab 06/19/20 0833 06/19/20 1535 06/20/20 0252  WBC 11.3* 9.1 7.3  RBC 5.70 5.46 5.05  HGB 16.4 15.5 14.6  HCT 49.9 48.0 44.9  MCV 87.5 87.9 88.9  MCH 28.8 28.4 28.9  MCHC 32.9 32.3 32.5  RDW 13.9 14.0 14.1  PLT 166 141* 122*     Patient ID  Cameron Daniel a 74 y.o.malewith a history of chronic systolic CHF (most recent EF 45-50% 02/2020), CAD s/p CABG 03/2019, HTN, DM2, HLDwho was admitted with symptomatic VT.  He is planned for VT ablation 06/22/2020.  Assessment & Plan    1.  VT Planned for VT ablation with Dr Quentin Ore tomorrow.  Therapeutic strategies for ventricular tachycardia including medicine and ablation were discussed in detail with the patient and his son again by me today. Risk, benefits, and alternatives to EP study and radiofrequency ablation were also discussed in detail today. These risks include but are not limited to stroke, bleeding, vascular damage, tamponade, perforation, damage to the heart and other structures, AV block requiring pacemaker, worsening renal function, and death. The patient understands these risk and wishes to proceed.   NPO after midnight for the procedure. Stop amiodarone and hold coreg overnight.  EF 45-50%.  He is very clear that he would like to avoid ICD implantation if possible.  I told him that Dr Quentin Ore would  evaluate with EP study and then make a final decision with him (shared decision making process) post ablation.  2. CAD s/p CABG No ischemic symptoms Cath reviewed, medical therapy advised Ef 45-50%  3. HTN Stable No change required today   Thompson Grayer MD, [facility] 06/21/2020 10:36 AM

## 2020-06-22 ENCOUNTER — Inpatient Hospital Stay (HOSPITAL_COMMUNITY): Payer: Medicare HMO | Admitting: Anesthesiology

## 2020-06-22 ENCOUNTER — Encounter (HOSPITAL_COMMUNITY): Payer: Self-pay | Admitting: Cardiovascular Disease

## 2020-06-22 ENCOUNTER — Encounter (HOSPITAL_COMMUNITY)
Admission: EM | Disposition: A | Discharge status: Home / Self Care | Payer: Self-pay | Source: Ambulatory Visit | Attending: Cardiology

## 2020-06-22 DIAGNOSIS — I472 Ventricular tachycardia: Secondary | ICD-10-CM | POA: Diagnosis not present

## 2020-06-22 DIAGNOSIS — I25119 Atherosclerotic heart disease of native coronary artery with unspecified angina pectoris: Secondary | ICD-10-CM | POA: Diagnosis not present

## 2020-06-22 HISTORY — PX: V TACH ABLATION: EP1227

## 2020-06-22 LAB — BASIC METABOLIC PANEL
Anion gap: 7 (ref 5–15)
BUN: 11 mg/dL (ref 8–23)
CO2: 28 mmol/L (ref 22–32)
Calcium: 9.9 mg/dL (ref 8.9–10.3)
Chloride: 102 mmol/L (ref 98–111)
Creatinine, Ser: 0.88 mg/dL (ref 0.61–1.24)
GFR, Estimated: >60 mL/min (ref >60)
Glucose, Bld: 128 mg/dL — ABNORMAL HIGH (ref 70–99)
Potassium: 3.9 mmol/L (ref 3.5–5.1)
Sodium: 137 mmol/L (ref 135–145)

## 2020-06-22 LAB — POCT ACTIVATED CLOTTING TIME
Activated Clotting Time: 285 seconds
Activated Clotting Time: 285 seconds
Activated Clotting Time: 301 seconds

## 2020-06-22 LAB — GLUCOSE, CAPILLARY: Glucose-Capillary: 114 mg/dL — ABNORMAL HIGH (ref 70–99)

## 2020-06-22 SURGERY — V TACH ABLATION
Anesthesia: General

## 2020-06-22 MED ORDER — HEPARIN SODIUM (PORCINE) 1000 UNIT/ML IJ SOLN
INTRAMUSCULAR | Status: DC | PRN
  Administered 2020-06-22: 1000 [IU] via INTRAVENOUS

## 2020-06-22 MED ORDER — ONDANSETRON HCL 4 MG/2ML IJ SOLN
INTRAMUSCULAR | Status: DC | PRN
  Administered 2020-06-22: 4 mg via INTRAVENOUS

## 2020-06-22 MED ORDER — HEPARIN (PORCINE) IN NACL 1000-0.9 UT/500ML-% IV SOLN
INTRAVENOUS | Status: AC
  Filled 2020-06-22: qty 500

## 2020-06-22 MED ORDER — LIDOCAINE 2% (20 MG/ML) 5 ML SYRINGE
INTRAMUSCULAR | Status: DC | PRN
  Administered 2020-06-22: 40 mg via INTRAVENOUS

## 2020-06-22 MED ORDER — PHENYLEPHRINE HCL-NACL 10-0.9 MG/250ML-% IV SOLN
INTRAVENOUS | Status: DC | PRN
  Administered 2020-06-22: 25 ug/min via INTRAVENOUS

## 2020-06-22 MED ORDER — VASOPRESSIN 20 UNIT/ML IV SOLN
INTRAVENOUS | Status: DC | PRN
  Administered 2020-06-22: 2 [IU] via INTRAVENOUS

## 2020-06-22 MED ORDER — HEPARIN SODIUM (PORCINE) 1000 UNIT/ML IJ SOLN
INTRAMUSCULAR | Status: AC
  Filled 2020-06-22: qty 1

## 2020-06-22 MED ORDER — SODIUM CHLORIDE 0.9% FLUSH: 3.0000 mL | INTRAVENOUS | Status: DC | PRN

## 2020-06-22 MED ORDER — NYSTATIN 100000 UNIT/GM EX POWD
Freq: Two times a day (BID) | CUTANEOUS | Status: DC
  Filled 2020-06-22: qty 15

## 2020-06-22 MED ORDER — BUPIVACAINE HCL (PF) 0.25 % IJ SOLN
INTRAMUSCULAR | Status: DC | PRN
  Administered 2020-06-22: 30 mL

## 2020-06-22 MED ORDER — ACETAMINOPHEN 325 MG PO TABS
650.0000 mg | ORAL_TABLET | ORAL | Status: DC | PRN
  Administered 2020-06-23 – 2020-06-26 (×4): 650 mg via ORAL
  Filled 2020-06-22 (×4): qty 2

## 2020-06-22 MED ORDER — SUGAMMADEX SODIUM 200 MG/2ML IV SOLN
INTRAVENOUS | Status: DC | PRN
  Administered 2020-06-22: 200 mg via INTRAVENOUS

## 2020-06-22 MED ORDER — PROPOFOL 10 MG/ML IV BOLUS
INTRAVENOUS | Status: DC | PRN
  Administered 2020-06-22: 120 mg via INTRAVENOUS

## 2020-06-22 MED ORDER — ONDANSETRON HCL 4 MG/2ML IJ SOLN: 4.0000 mg | Freq: Four times a day (QID) | INTRAMUSCULAR | Status: DC | PRN

## 2020-06-22 MED ORDER — BUPIVACAINE HCL (PF) 0.25 % IJ SOLN
INTRAMUSCULAR | Status: AC
  Filled 2020-06-22: qty 30

## 2020-06-22 MED ORDER — DEXAMETHASONE SODIUM PHOSPHATE 10 MG/ML IJ SOLN
INTRAMUSCULAR | Status: DC | PRN
  Administered 2020-06-22: 10 mg via INTRAVENOUS

## 2020-06-22 MED ORDER — HEPARIN (PORCINE) IN NACL 1000-0.9 UT/500ML-% IV SOLN
INTRAVENOUS | Status: DC | PRN
  Administered 2020-06-22 (×5): 500 mL

## 2020-06-22 MED ORDER — HEPARIN SODIUM (PORCINE) 1000 UNIT/ML IJ SOLN
INTRAMUSCULAR | Status: DC | PRN
  Administered 2020-06-22: 4000 [IU] via INTRAVENOUS
  Administered 2020-06-22: 15000 [IU] via INTRAVENOUS
  Administered 2020-06-22: 3000 [IU] via INTRAVENOUS

## 2020-06-22 MED ORDER — SODIUM CHLORIDE 0.9% FLUSH
3.0000 mL | Freq: Two times a day (BID) | INTRAVENOUS | Status: DC
  Administered 2020-06-22 – 2020-06-23 (×2): 3 mL via INTRAVENOUS

## 2020-06-22 MED ORDER — FENTANYL CITRATE (PF) 250 MCG/5ML IJ SOLN
INTRAMUSCULAR | Status: DC | PRN
Start: 2020-06-22 — End: 2020-06-22
  Administered 2020-06-22: 50 ug via INTRAVENOUS

## 2020-06-22 MED ORDER — SODIUM CHLORIDE 0.9 % IV SOLN: 250.0000 mL | INTRAVENOUS | Status: DC | PRN

## 2020-06-22 MED ORDER — ROCURONIUM BROMIDE 10 MG/ML (PF) SYRINGE
PREFILLED_SYRINGE | INTRAVENOUS | Status: DC | PRN
  Administered 2020-06-22: 70 mg via INTRAVENOUS
  Administered 2020-06-22: 30 mg via INTRAVENOUS

## 2020-06-22 MED ORDER — PROTAMINE SULFATE 10 MG/ML IV SOLN
INTRAVENOUS | Status: DC | PRN
  Administered 2020-06-22: 30 mg via INTRAVENOUS

## 2020-06-22 MED ORDER — VASOPRESSIN 20 UNIT/ML IV SOLN
INTRAVENOUS | Status: AC
  Filled 2020-06-22: qty 1

## 2020-06-22 MED FILL — Verapamil HCl IV Soln 2.5 MG/ML: INTRAVENOUS | Qty: 2 | Status: AC

## 2020-06-22 SURGICAL SUPPLY — 21 items
BAG SNAP BAND KOVER 36X36 (MISCELLANEOUS) ×1 IMPLANT
CATH 8FR REPROCESSED SOUNDSTAR (CATHETERS) ×2 IMPLANT
CATH 8FR SOUNDSTAR REPROCESSED (CATHETERS) IMPLANT
CATH JOSEPH QUAD ALLRED 6F REP (CATHETERS) ×1 IMPLANT
CATH MAPPNG PENTARAY F 2-6-2MM (CATHETERS) IMPLANT
CATH SMTCH THERMOCOOL SF DF (CATHETERS) ×1 IMPLANT
CLOSURE PERCLOSE PROSTYLE (VASCULAR PRODUCTS) ×3 IMPLANT
MAT PREVALON FULL STRYKER (MISCELLANEOUS) ×1 IMPLANT
PACK EP LATEX FREE (CUSTOM PROCEDURE TRAY) ×2
PACK EP LF (CUSTOM PROCEDURE TRAY) ×1 IMPLANT
PAD PRO RADIOLUCENT 2001M-C (PAD) ×2 IMPLANT
PATCH CARTO3 (PAD) ×1 IMPLANT
PENTARAY F 2-6-2MM (CATHETERS) ×2
SHEATH BAYLIS TRANSSEPTAL 98CM (NEEDLE) ×1 IMPLANT
SHEATH CARTO VIZIGO SM CVD (SHEATH) ×1 IMPLANT
SHEATH PINNACLE 4F 10CM (SHEATH) ×1 IMPLANT
SHEATH PINNACLE 8F 10CM (SHEATH) ×2 IMPLANT
SHEATH PINNACLE 9F 10CM (SHEATH) ×1 IMPLANT
SHEATH PROBE COVER 6X72 (BAG) ×1 IMPLANT
SHEATH SET SUPER ARROWFLEX 8FR (SHEATH) ×1 IMPLANT
TUBING SMART ABLATE COOLFLOW (TUBING) ×1 IMPLANT

## 2020-06-22 NOTE — Anesthesia Preprocedure Evaluation (Signed)
Anesthesia Evaluation  Patient identified by MRN, date of birth, ID band Patient awake    Reviewed: Allergy & Precautions, NPO status , Patient's Chart, lab work & pertinent test results  Airway Mallampati: I  TM Distance: >3 FB Neck ROM: Full    Dental  (+) Edentulous Upper, Edentulous Lower   Pulmonary former smoker,    breath sounds clear to auscultation       Cardiovascular hypertension, Pt. on home beta blockers and Pt. on medications + CAD, + CABG and +CHF   Rhythm:Regular Rate:Normal     Neuro/Psych PSYCHIATRIC DISORDERS Anxiety Depression negative neurological ROS     GI/Hepatic Neg liver ROS, GERD  Medicated,  Endo/Other  diabetes, Type 2  Renal/GU Renal disease  negative genitourinary   Musculoskeletal  (+) Arthritis ,   Abdominal Normal abdominal exam  (+)   Peds  Hematology   Anesthesia Other Findings   Reproductive/Obstetrics                             Anesthesia Physical Anesthesia Plan  ASA: III  Anesthesia Plan: General   Post-op Pain Management:    Induction: Intravenous  PONV Risk Score and Plan: 2 and Ondansetron, Dexamethasone and Treatment may vary due to age or medical condition  Airway Management Planned: Oral ETT  Additional Equipment: None  Intra-op Plan:   Post-operative Plan: Extubation in OR  Informed Consent: I have reviewed the patients History and Physical, chart, labs and discussed the procedure including the risks, benefits and alternatives for the proposed anesthesia with the patient or authorized representative who has indicated his/her understanding and acceptance.     Dental advisory given  Plan Discussed with: CRNA  Anesthesia Plan Comments: (Echo: 1. Inferior and inferoseptal hypokinesis. Left ventricular ejection  fraction, by estimation, is 45 to 50%. The left ventricle has mildly  decreased function. The left ventricle  demonstrates regional wall motion  abnormalities (see scoring diagram/findings  for description). There is mild concentric left ventricular hypertrophy.  Left ventricular diastolic parameters are consistent with Grade II  diastolic dysfunction (pseudonormalization).  2. Right ventricular systolic function is normal. The right ventricular  size is normal.  3. Left atrial size was moderately dilated.  4. The mitral valve is normal in structure. Trivial mitral valve  regurgitation. No evidence of mitral stenosis. Moderate mitral annular  calcification.  5. The aortic valve is calcified. There is mild calcification of the  aortic valve. There is mild thickening of the aortic valve. Aortic valve  regurgitation is not visualized. No aortic stenosis is present.  6. The inferior vena cava is dilated in size with <50% respiratory  variability, suggesting right atrial pressure of 15 mmHg. )        Anesthesia Quick Evaluation

## 2020-06-22 NOTE — Anesthesia Procedure Notes (Signed)
Procedure Name: Intubation Date/Time: 06/22/2020 11:59 AM Performed by: Kyung Rudd, CRNA Pre-anesthesia Checklist: Patient identified, Emergency Drugs available, Suction available and Patient being monitored Patient Re-evaluated:Patient Re-evaluated prior to induction Oxygen Delivery Method: Circle system utilized Preoxygenation: Pre-oxygenation with 100% oxygen Induction Type: IV induction Ventilation: Mask ventilation without difficulty and Oral airway inserted - appropriate to patient size Laryngoscope Size: Mac and 4 Grade View: Grade I Tube type: Oral Tube size: 7.5 mm Number of attempts: 1 Airway Equipment and Method: Stylet Placement Confirmation: ETT inserted through vocal cords under direct vision,  positive ETCO2 and breath sounds checked- equal and bilateral Secured at: 21 cm Tube secured with: Tape Dental Injury: Teeth and Oropharynx as per pre-operative assessment and Injury to lip

## 2020-06-22 NOTE — Progress Notes (Signed)
Verified allergies. NPO since midnight. Nothing removable.

## 2020-06-22 NOTE — Progress Notes (Signed)
Progress Note  [name] Date of Encounter: 06/22/2020  [facility] Cardiologist: [name], MD   Subjective   Feels well, just hungry this AM.  No CP, SOB  Inpatient Medications    Scheduled Meds: . aspirin EC  81 mg Oral Daily  . carvedilol  3.125 mg Oral BID WC  . citalopram  10 mg Oral q1800  . colestipol  2 g Oral BID  . empagliflozin  25 mg Oral Daily  . finasteride  5 mg Oral Daily  . linagliptin  5 mg Oral Daily  . losartan  25 mg Oral q morning - 10a  . pantoprazole  40 mg Oral q morning - 10a  . rosuvastatin  40 mg Oral q1800  . sodium chloride flush  3 mL Intravenous Q12H  . sodium chloride flush  3 mL Intravenous Q12H  . vitamin B-12  500 mcg Oral Daily   Continuous Infusions: . sodium chloride 10 mL/hr at 06/19/20 1225  . sodium chloride    . sodium chloride 10 mL/hr at 06/22/20 0609   PRN Meds: sodium chloride, acetaminophen, fluticasone, loratadine, ondansetron (ZOFRAN) IV, sodium chloride flush   Vital Signs    Vitals:   06/21/20 0800 06/21/20 1334 06/21/20 2018 06/22/20 0523  BP: 120/72 112/72 114/67 120/82  Pulse: 67 67 66 76  Resp: 16 19 20 20   Temp: 97.8 F (36.6 C) 98 F (36.7 C) 97.6 F (36.4 C) (!) 97.4 F (36.3 C)  TempSrc: Oral Oral Oral Oral  SpO2: 100% 96%    Weight:    99.8 kg  Height:        Intake/Output Summary (Last 24 hours) at 06/22/2020 0800 Last data filed at 06/22/2020 0531 Gross per 24 hour  Intake 486 ml  Output 2625 ml  Net -2139 ml   Last 3 Weights 06/22/2020 06/21/2020 06/20/2020  Weight (lbs) 220 lb 215 lb 9.8 oz 216 lb 15.3 oz  Weight (kg) 99.791 kg 97.8 kg 98.41 kg      Telemetry    SR 60's, no arrhythmias - Personally Reviewed  ECG    No new EKGs - Personally Reviewed  Physical Exam   GEN: No acute distress.   Neck: No JVD Cardiac: RRR, no murmurs, rubs, or gallops.  Respiratory: CTA b/l. GI: Soft, nontender, non-distended  MS: No edema; No  deformity. Neuro:  Nonfocal  Psych: Normal affect   Labs    High Sensitivity Troponin:   Recent Labs  Lab 06/19/20 0833 06/19/20 1535  TROPONINIHS 237* 387*      Chemistry Recent Labs  Lab 06/19/20 [phone] 06/19/20 0833 06/19/20 1535 06/20/20 0252 06/21/20 0338  NA 137  --   --  137 137  K 4.4  --   --  4.2 3.6  CL 101  --   --  100 102  CO2 23  --   --  28 27  GLUCOSE 199*  --   --  121* 131*  BUN 18  --   --  17 17  CREATININE 1.09   < > 0.91 1.06 0.99  CALCIUM 10.1  --   --  9.5 9.4  GFRNONAA >60   < > >60 >60 >60  ANIONGAP 13  --   --  9 8   < > = values in this interval not displayed.     Hematology Recent Labs  Lab 06/19/20 0833 06/19/20 1535 06/20/20 0252  WBC 11.3* 9.1 7.3  RBC 5.70 5.46  5.05  HGB 16.4 15.5 14.6  HCT 49.9 48.0 44.9  MCV 87.5 87.9 88.9  MCH 28.8 28.4 28.9  MCHC 32.9 32.3 32.5  RDW 13.9 14.0 14.1  PLT 166 141* 122*    BNPNo results for input(s): BNP, PROBNP in the last 168 hours.   DDimer No results for input(s): DDIMER in the last 168 hours.   Radiology      Cardiac Studies    06/19/2020; LHC  Ost LAD lesion is 80% stenosed.  Prox LAD to Mid LAD lesion is 90% stenosed.  Mid LAD lesion is 95% stenosed.  Mid RCA lesion is 85% stenosed.  Mid LM to Dist LM lesion is 70% stenosed.  Prox Cx to Mid Cx lesion is 85% stenosed.  Mid Cx to Dist Cx lesion is 95% stenosed.  Non-stenotic Prox RCA lesion.  Prox Graft lesion is 20% stenosed.   Severe native multivessel CAD with 70% distal left main stenosis, 80% ostial LAD stenosis followed by segmental 90% stenoses before and after the first diagonal vessel with 95% stenosis in the midsegment proximal to a septal perforating artery with competitive filling seen distally; diffuse 80 to 95% circumflex stenoses; ectatic proximal RCA with 80% mid stenoses.  Patent LIMA graft supplying the mid LAD.  Patent SVG supplying the distal circumflex marginal vessel branch.  Patent  SVG supplying the distal RCA with mild smooth 20% focal proximal to mid narrowing.  Fairly preserved global LV function with EF estimated 50 to 55%.  There is a suggestion of possible apical hypertrophy with nipple protrusion of the apex.  LVEDP 1mmHg.  RECOMMENDATION: The patient's bypass conduits are widely patent.  Further EP evaluation for possible defibrillator or VT ablation.    06/20/2020: TTE IMPRESSIONS  1. Inferior and inferoseptal hypokinesis. Left ventricular ejection  fraction, by estimation, is 45 to 50%. The left ventricle has mildly  decreased function. The left ventricle demonstrates regional wall motion  abnormalities (see scoring diagram/findings  for description). There is mild concentric left ventricular hypertrophy.  Left ventricular diastolic parameters are consistent with Grade II  diastolic dysfunction (pseudonormalization).  2. Right ventricular systolic function is normal. The right ventricular  size is normal.  3. Left atrial size was moderately dilated.  4. The mitral valve is normal in structure. Trivial mitral valve  regurgitation. No evidence of mitral stenosis. Moderate mitral annular  calcification.  5. The aortic valve is calcified. There is mild calcification of the  aortic valve. There is mild thickening of the aortic valve. Aortic valve  regurgitation is not visualized. No aortic stenosis is present.  6. The inferior vena cava is dilated in size with <50% respiratory  variability, suggesting right atrial pressure of 15 mmHg.    Patient Profile     74 y.o. male w/PMHx of CAD (CABG 03/2019), HTN, DM, HLD sought attention at his PMD for palpitations, weakness, found in [facility] brought [facility] via EMS, DCCV in the ER, admitted for further evaluation and management  Assessment & Plan    1.  VT       Planned for EPS/VT ablation with Dr Quentin Ore this morning     amiodarone and cored stopped  yesterday  EF 45-50% (by echo) 50-55% (by LV  gram) He is very clear that he would like to avoid ICD implantation if possible.   He will d/w Dr Quentin Ore post EP study/ablation for final recommendation and decision with him (shared decision making process)  2. CAD s/p CABG No ischemic symptoms Works  a very labor intensive job, denies any exertional intolerances, symptoms Cath reviewed, medical therapy advised EF 45-50% by echo, 50-55% by LV gram  3. HTN No change required today  For questions or updates, please contact [place] Please consult [email or web address] for contact info under        Signed, Baldwin Jamaica, PA-C  06/22/2020, 8:00 AM

## 2020-06-22 NOTE — Transfer of Care (Signed)
Immediate Anesthesia Transfer of Care Note  Patient: Cameron Daniel  Procedure(s) Performed: Stephanie Coup ABLATION (N/A )  Patient Location: PACU and Cath Lab  Anesthesia Type:General  Level of Consciousness: awake, alert , oriented and patient cooperative  Airway & Oxygen Therapy: Patient Spontanous Breathing and Patient connected to nasal cannula oxygen  Post-op Assessment: Report given to RN and Post -op Vital signs reviewed and stable  Post vital signs: Reviewed and stable  Last Vitals:  Vitals Value Taken Time  BP    Temp 36.5 C 06/22/20 1449  Pulse 71 06/22/20 1448  Resp 18 06/22/20 1448  SpO2 94 % 06/22/20 1448  Vitals shown include unvalidated device data.  Last Pain:  Vitals:   06/22/20 1449  TempSrc: Temporal  PainSc:       Patients Stated Pain Goal: 0 (86/57/84 6962)  Complications: No complications documented.

## 2020-06-23 ENCOUNTER — Encounter (HOSPITAL_COMMUNITY): Payer: Self-pay | Admitting: Cardiology

## 2020-06-23 DIAGNOSIS — I472 Ventricular tachycardia: Secondary | ICD-10-CM | POA: Diagnosis not present

## 2020-06-23 DIAGNOSIS — I25119 Atherosclerotic heart disease of native coronary artery with unspecified angina pectoris: Secondary | ICD-10-CM | POA: Diagnosis not present

## 2020-06-23 LAB — BASIC METABOLIC PANEL
Anion gap: 11 (ref 5–15)
BUN: 16 mg/dL (ref 8–23)
CO2: 26 mmol/L (ref 22–32)
Calcium: 9.8 mg/dL (ref 8.9–10.3)
Chloride: 102 mmol/L (ref 98–111)
Creatinine, Ser: 1.1 mg/dL (ref 0.61–1.24)
GFR, Estimated: >60 mL/min (ref >60)
Glucose, Bld: 149 mg/dL — ABNORMAL HIGH (ref 70–99)
Potassium: 4.5 mmol/L (ref 3.5–5.1)
Sodium: 139 mmol/L (ref 135–145)

## 2020-06-23 LAB — SURGICAL PCR SCREEN
MRSA, PCR: NEGATIVE
Staphylococcus aureus: NEGATIVE

## 2020-06-23 MED ORDER — SODIUM CHLORIDE 0.9% FLUSH
3.0000 mL | Freq: Two times a day (BID) | INTRAVENOUS | Status: DC
  Administered 2020-06-24 – 2020-06-26 (×4): 3 mL via INTRAVENOUS

## 2020-06-23 NOTE — Progress Notes (Signed)
Progress Note  [name] Date of Encounter: 06/23/2020  [facility] Cardiologist: [name], MD   Subjective   Feels well.  No CP, SOB, no site discomfort  Inpatient Medications    Scheduled Meds: . aspirin EC  81 mg Oral Daily  . carvedilol  3.125 mg Oral BID WC  . citalopram  10 mg Oral q1800  . colestipol  2 g Oral BID  . empagliflozin  25 mg Oral Daily  . finasteride  5 mg Oral Daily  . linagliptin  5 mg Oral Daily  . losartan  25 mg Oral q morning - 10a  . nystatin   Topical BID  . pantoprazole  40 mg Oral q morning - 10a  . rosuvastatin  40 mg Oral q1800  . sodium chloride flush  3 mL Intravenous Q12H  . sodium chloride flush  3 mL Intravenous Q12H  . sodium chloride flush  3 mL Intravenous Q12H  . vitamin B-12  500 mcg Oral Daily   Continuous Infusions: . sodium chloride 10 mL/hr at 06/19/20 1225  . sodium chloride    . sodium chloride     PRN Meds: sodium chloride, sodium chloride, acetaminophen, acetaminophen, fluticasone, loratadine, ondansetron (ZOFRAN) IV, ondansetron (ZOFRAN) IV, sodium chloride flush, sodium chloride flush   Vital Signs    Vitals:   06/22/20 1530 06/22/20 1739 06/22/20 2052 06/23/20 0450  BP: 113/71 134/67 (!) 99/58 (!) 98/56  Pulse: 72 69 66 66  Resp: 16  18 18   Temp: 98.1 F (36.7 C)  98.3 F (36.8 C) (!) 97.5 F (36.4 C)  TempSrc: Oral  Oral Oral  SpO2: 97%  96% 98%  Weight:      Height:        Intake/Output Summary (Last 24 hours) at 06/23/2020 0808 Last data filed at 06/23/2020 0453 Gross per 24 hour  Intake 1565 ml  Output 1660 ml  Net -95 ml   Last 3 Weights 06/22/2020 06/21/2020 06/20/2020  Weight (lbs) 220 lb 215 lb 9.8 oz 216 lb 15.3 oz  Weight (kg) 99.791 kg 97.8 kg 98.41 kg      Telemetry    SR 60's, no arrhythmias - Personally Reviewed  ECG    No new EKGs - Personally Reviewed  Physical Exam   GEN: No acute distress.   Neck: No JVD Cardiac: RRR, no murmurs,  rubs, or gallops.  Respiratory: CTA b/l. GI: Soft, nontender, non-distended  MS: No edema; No deformity. Neuro:  Nonfocal  Psych: Normal affect   B/l groin sites are soft, no bleeding or hematoma, small area of ecchymosis R  Labs    High Sensitivity Troponin:   Recent Labs  Lab 06/19/20 0833 06/19/20 1535  TROPONINIHS 237* 387*      Chemistry Recent Labs  Lab 06/21/20 0338 06/22/20 0845 06/23/20 0209  NA 137 137 139  K 3.6 3.9 4.5  CL 102 102 102  CO2 27 28 26   GLUCOSE 131* 128* 149*  BUN 17 11 16   CREATININE 0.99 0.88 1.10  CALCIUM 9.4 9.9 9.8  GFRNONAA >60 >60 >60  ANIONGAP 8 7 11      Hematology Recent Labs  Lab 06/19/20 0833 06/19/20 1535 06/20/20 0252  WBC 11.3* 9.1 7.3  RBC 5.70 5.46 5.05  HGB 16.4 15.5 14.6  HCT 49.9 48.0 44.9  MCV 87.5 87.9 88.9  MCH 28.8 28.4 28.9  MCHC 32.9 32.3 32.5  RDW 13.9 14.0 14.1  PLT 166 141* 122*  BNPNo results for input(s): BNP, PROBNP in the last 168 hours.   DDimer No results for input(s): DDIMER in the last 168 hours.   Radiology      Cardiac Studies   06/22/2020: EPS/Ablation CONCLUSIONS: 1. Sinus rhythm upon presentation   2. Clinical VT noninducible. 3. Nonclinical VT induced and was not hemodynamically tolerated (positive precordial concordance, inferior axis, CL 375ms) 4. Successful scar modification along the mid inferior wall extending to the lateral wall 5. A large inferior and inferolateral LV scar on ICE and 3d electroanatomic mapping. 6. No early apparent complications. 7. ICD discussion with the patient and family  06/19/2020; [place] LAD lesion is 80% stenosed.  Prox LAD to Mid LAD lesion is 90% stenosed.  Mid LAD lesion is 95% stenosed.  Mid RCA lesion is 85% stenosed.  Mid LM to Dist LM lesion is 70% stenosed.  Prox Cx to Mid Cx lesion is 85% stenosed.  Mid Cx to Dist Cx lesion is 95% stenosed.  Non-stenotic Prox RCA lesion.  Prox Graft lesion is 20% stenosed.     Severe native multivessel CAD with 70% distal left main stenosis, 80% ostial LAD stenosis followed by segmental 90% stenoses before and after the first diagonal vessel with 95% stenosis in the midsegment proximal to a septal perforating artery with competitive filling seen distally; diffuse 80 to 95% circumflex stenoses; ectatic proximal RCA with 80% mid stenoses.  Patent LIMA graft supplying the mid LAD.  Patent SVG supplying the distal circumflex marginal vessel branch.  Patent SVG supplying the distal RCA with mild smooth 20% focal proximal to mid narrowing.  Fairly preserved global LV function with EF estimated 50 to 55%.  There is a suggestion of possible apical hypertrophy with nipple protrusion of the apex.  LVEDP 31mmHg.  RECOMMENDATION: The patient's bypass conduits are widely patent.  Further EP evaluation for possible defibrillator or VT ablation.    06/20/2020: TTE IMPRESSIONS  1. Inferior and inferoseptal hypokinesis. Left ventricular ejection  fraction, by estimation, is 45 to 50%. The left ventricle has mildly  decreased function. The left ventricle demonstrates regional wall motion  abnormalities (see scoring diagram/findings  for description). There is mild concentric left ventricular hypertrophy.  Left ventricular diastolic parameters are consistent with Grade II  diastolic dysfunction (pseudonormalization).  2. Right ventricular systolic function is normal. The right ventricular  size is normal.  3. Left atrial size was moderately dilated.  4. The mitral valve is normal in structure. Trivial mitral valve  regurgitation. No evidence of mitral stenosis. Moderate mitral annular  calcification.  5. The aortic valve is calcified. There is mild calcification of the  aortic valve. There is mild thickening of the aortic valve. Aortic valve  regurgitation is not visualized. No aortic stenosis is present.  6. The inferior vena cava is dilated in size with <50%  respiratory  variability, suggesting right atrial pressure of 15 mmHg.    Patient Profile     74 y.o. male w/PMHx of CAD (CABG 03/2019), HTN, DM, HLD sought attention at his PMD for palpitations, weakness, found in [facility] brought to [facility] via EMS, DCCV in the ER, admitted for further evaluation and management  Assessment & Plan    1.  VT       Planned for EPS/VT ablation with Dr Quentin Ore this morning     amiodarone and cored stopped  yesterday  EF 45-50% (by echo) 50-55% (by LV gram) S/p EPS/ablation yesterday In further d/w Dr. Quentin Ore and his family,  the patient would like to proceed with ICD implant Plan for Thursday No amiodarone Coreg was resumed  D/w patient and RN, OOB, mobilizing  today   2. CAD s/p CABG No ischemic symptoms Works a very labor intensive job, denies any exertional intolerances, symptoms Cath reviewed, medical therapy advised EF 45-50% by echo, 50-55% by LV gram  3. HTN Looks good    For questions or updates, please contact [place] Please consult [email or web address] for contact info under        Signed, Baldwin Jamaica, PA-C  06/23/2020, 8:08 AM

## 2020-06-23 NOTE — Anesthesia Postprocedure Evaluation (Signed)
Anesthesia Post Note  Patient: Cameron Daniel  Procedure(s) Performed: Stephanie Coup ABLATION (N/A )     Patient location during evaluation: PACU Anesthesia Type: General Level of consciousness: sedated Pain management: pain level controlled Vital Signs Assessment: post-procedure vital signs reviewed and stable Respiratory status: spontaneous breathing and respiratory function stable Cardiovascular status: stable Postop Assessment: no apparent nausea or vomiting Anesthetic complications: no   No complications documented.  Last Vitals:  Vitals:   06/22/20 2052 06/23/20 0450  BP: (!) 99/58 (!) 98/56  Pulse: 66 66  Resp: 18 18  Temp: 36.8 C (!) 36.4 C  SpO2: 96% 98%    Last Pain:  Vitals:   06/23/20 0450  TempSrc: Oral  PainSc:                  [name]

## 2020-06-23 NOTE — Care Management Important Message (Signed)
Important Message  Patient Details  Name: ANDREA COLGLAZIER MRN: [id number] Date of Birth: 1945/11/05   Medicare Important Message Given:  Yes     Shelda Altes 06/23/2020, 9:36 AM

## 2020-06-24 DIAGNOSIS — I25119 Atherosclerotic heart disease of native coronary artery with unspecified angina pectoris: Secondary | ICD-10-CM | POA: Diagnosis not present

## 2020-06-24 DIAGNOSIS — I472 Ventricular tachycardia: Secondary | ICD-10-CM | POA: Diagnosis not present

## 2020-06-24 LAB — BASIC METABOLIC PANEL
Anion gap: 9 (ref 5–15)
BUN: 19 mg/dL (ref 8–23)
CO2: 29 mmol/L (ref 22–32)
Calcium: 10.4 mg/dL — ABNORMAL HIGH (ref 8.9–10.3)
Chloride: 99 mmol/L (ref 98–111)
Creatinine, Ser: 1.16 mg/dL (ref 0.61–1.24)
GFR, Estimated: >60 mL/min (ref >60)
Glucose, Bld: 138 mg/dL — ABNORMAL HIGH (ref 70–99)
Potassium: 4.7 mmol/L (ref 3.5–5.1)
Sodium: 137 mmol/L (ref 135–145)

## 2020-06-24 LAB — GLUCOSE, CAPILLARY: Glucose-Capillary: 160 mg/dL — ABNORMAL HIGH (ref 70–99)

## 2020-06-24 MED ORDER — CHLORHEXIDINE GLUCONATE 4 % EX LIQD
60.0000 mL | Freq: Once | CUTANEOUS | Status: AC
  Administered 2020-06-24: 4 via TOPICAL
  Filled 2020-06-24: qty 60

## 2020-06-24 MED ORDER — SODIUM CHLORIDE 0.9 % IV SOLN: 250.0000 mL | INTRAVENOUS | Status: DC

## 2020-06-24 MED ORDER — SODIUM CHLORIDE 0.9 % IV SOLN: INTRAVENOUS | Status: DC

## 2020-06-24 MED ORDER — SODIUM CHLORIDE 0.9 % IV SOLN
80.0000 mg | INTRAVENOUS | Status: AC
  Administered 2020-06-25: 80 mg
  Filled 2020-06-24: qty 2

## 2020-06-24 MED ORDER — CARVEDILOL 6.25 MG PO TABS
6.2500 mg | ORAL_TABLET | Freq: Two times a day (BID) | ORAL | Status: DC
  Administered 2020-06-24 – 2020-06-26 (×5): 6.25 mg via ORAL
  Filled 2020-06-24 (×5): qty 1

## 2020-06-24 MED ORDER — CHLORHEXIDINE GLUCONATE 4 % EX LIQD
60.0000 mL | Freq: Once | CUTANEOUS | Status: AC
  Filled 2020-06-24: qty 60

## 2020-06-24 MED ORDER — SODIUM CHLORIDE 0.9% FLUSH: 3.0000 mL | INTRAVENOUS | Status: DC | PRN

## 2020-06-24 MED ORDER — VANCOMYCIN HCL IN DEXTROSE 1-5 GM/200ML-% IV SOLN
1000.0000 mg | INTRAVENOUS | Status: AC
  Administered 2020-06-25: 1000 mg via INTRAVENOUS

## 2020-06-24 NOTE — Progress Notes (Signed)
Progress Note  [name] Date of Encounter: 06/24/2020  [facility] Cardiologist: [name], MD   Subjective   NAEO. Up and moving this morning. No dizziness/lightheadedness. Answered questions about ICD remote monitoring this AM.  Inpatient Medications    Scheduled Meds: . aspirin EC  81 mg Oral Daily  . carvedilol  3.125 mg Oral BID WC  . citalopram  10 mg Oral q1800  . colestipol  2 g Oral BID  . empagliflozin  25 mg Oral Daily  . finasteride  5 mg Oral Daily  . linagliptin  5 mg Oral Daily  . losartan  25 mg Oral q morning - 10a  . nystatin   Topical BID  . pantoprazole  40 mg Oral q morning - 10a  . rosuvastatin  40 mg Oral q1800  . sodium chloride flush  3 mL Intravenous Q12H  . vitamin B-12  500 mcg Oral Daily   Continuous Infusions:  PRN Meds: acetaminophen, fluticasone, loratadine, ondansetron (ZOFRAN) IV, sodium chloride flush   Vital Signs    Vitals:   06/23/20 1308 06/23/20 1702 06/23/20 2021 06/24/20 0500  BP: 111/62 110/69 109/71 110/67  Pulse: 68 66 69 64  Resp: 20  18 18   Temp: 97.8 F (36.6 C)  98.4 F (36.9 C) 97.8 F (36.6 C)  TempSrc: Oral  Oral Oral  SpO2: 95%  98% 99%  Weight:    98.1 kg  Height:        Intake/Output Summary (Last 24 hours) at 06/24/2020 0650 Last data filed at 06/24/2020 0500 Gross per 24 hour  Intake 240 ml  Output 450 ml  Net -210 ml   Last 3 Weights 06/24/2020 06/22/2020 06/21/2020  Weight (lbs) 216 lb 4.3 oz 220 lb 215 lb 9.8 oz  Weight (kg) 98.1 kg 99.791 kg 97.8 kg      Telemetry    SR 60's, no arrhythmias - Personally Reviewed  ECG    No new EKGs - Personally Reviewed  Physical Exam   GEN: No acute distress.   Neck: No JVD Cardiac: RRR, no murmurs, rubs, or gallops. Groins soft. Respiratory: CTA b/l. GI: Soft, nontender, non-distended  MS: No edema; No deformity. Neuro:  Nonfocal  Psych: Normal affect     Labs    High Sensitivity Troponin:   Recent  Labs  Lab 06/19/20 0833 06/19/20 1535  TROPONINIHS 237* 387*      Chemistry Recent Labs  Lab 06/22/20 0845 06/23/20 0209 06/24/20 0145  NA 137 139 137  K 3.9 4.5 4.7  CL 102 102 99  CO2 28 26 29   GLUCOSE 128* 149* 138*  BUN 11 16 19   CREATININE 0.88 1.10 1.16  CALCIUM 9.9 9.8 10.4*  GFRNONAA >60 >60 >60  ANIONGAP 7 11 9      Hematology Recent Labs  Lab 06/19/20 0833 06/19/20 1535 06/20/20 0252  WBC 11.3* 9.1 7.3  RBC 5.70 5.46 5.05  HGB 16.4 15.5 14.6  HCT 49.9 48.0 44.9  MCV 87.5 87.9 88.9  MCH 28.8 28.4 28.9  MCHC 32.9 32.3 32.5  RDW 13.9 14.0 14.1  PLT 166 141* 122*    BNPNo results for input(s): BNP, PROBNP in the last 168 hours.   DDimer No results for input(s): DDIMER in the last 168 hours.   Radiology      Cardiac Studies   06/22/2020: EPS/Ablation CONCLUSIONS: 1. Sinus rhythm upon presentation   2. Clinical VT noninducible. 3. Nonclinical VT induced and was not hemodynamically  tolerated (positive precordial concordance, inferior axis, CL 354ms) 4. Successful scar modification along the mid inferior wall extending to the lateral wall 5. A large inferior and inferolateral LV scar on ICE and 3d electroanatomic mapping. 6. No early apparent complications. 7. ICD discussion with the patient and family  06/19/2020; [place] LAD lesion is 80% stenosed.  Prox LAD to Mid LAD lesion is 90% stenosed.  Mid LAD lesion is 95% stenosed.  Mid RCA lesion is 85% stenosed.  Mid LM to Dist LM lesion is 70% stenosed.  Prox Cx to Mid Cx lesion is 85% stenosed.  Mid Cx to Dist Cx lesion is 95% stenosed.  Non-stenotic Prox RCA lesion.  Prox Graft lesion is 20% stenosed.   Severe native multivessel CAD with 70% distal left main stenosis, 80% ostial LAD stenosis followed by segmental 90% stenoses before and after the first diagonal vessel with 95% stenosis in the midsegment proximal to a septal perforating artery with competitive filling seen distally;  diffuse 80 to 95% circumflex stenoses; ectatic proximal RCA with 80% mid stenoses.  Patent LIMA graft supplying the mid LAD.  Patent SVG supplying the distal circumflex marginal vessel branch.  Patent SVG supplying the distal RCA with mild smooth 20% focal proximal to mid narrowing.  Fairly preserved global LV function with EF estimated 50 to 55%.  There is a suggestion of possible apical hypertrophy with nipple protrusion of the apex.  LVEDP 62mmHg.  RECOMMENDATION: The patient's bypass conduits are widely patent.  Further EP evaluation for possible defibrillator or VT ablation.    06/20/2020: TTE IMPRESSIONS  1. Inferior and inferoseptal hypokinesis. Left ventricular ejection  fraction, by estimation, is 45 to 50%. The left ventricle has mildly  decreased function. The left ventricle demonstrates regional wall motion  abnormalities (see scoring diagram/findings  for description). There is mild concentric left ventricular hypertrophy.  Left ventricular diastolic parameters are consistent with Grade II  diastolic dysfunction (pseudonormalization).  2. Right ventricular systolic function is normal. The right ventricular  size is normal.  3. Left atrial size was moderately dilated.  4. The mitral valve is normal in structure. Trivial mitral valve  regurgitation. No evidence of mitral stenosis. Moderate mitral annular  calcification.  5. The aortic valve is calcified. There is mild calcification of the  aortic valve. There is mild thickening of the aortic valve. Aortic valve  regurgitation is not visualized. No aortic stenosis is present.  6. The inferior vena cava is dilated in size with <50% respiratory  variability, suggesting right atrial pressure of 15 mmHg.    Patient Profile     74 y.o. male w/PMHx of CAD (CABG 03/2019), HTN, DM, HLD sought attention at his PMD for palpitations, weakness, found in [facility] brought to [facility] via EMS, DCCV in the ER, admitted for further  evaluation and management  Assessment & Plan    1.  VT       Planned for EPS/VT ablation with Dr Quentin Ore this morning     amiodarone stopped. Continue coreg.  EF 45-50% (by echo)  S/p EPS/ablation 10/25. Plan to implant VVI ICD Thursday. NPO after MN.  The patient has an ischemic CM and history of sustained VT.  At this time, he meets criteria for ICD implantation for secondary prevention of sudden death.  I have had a thorough discussion with the patient reviewing options.  The patient and their family (if available) have had opportunities to ask questions and have them answered. The patient and I have  decided together through a shared decision making process to proceed with ICD implant at this time.   Risks, benefits, alternatives to ICD implantation were discussed in detail with the patient today. The patient understands that the risks include but are not limited to bleeding, infection, pneumothorax, perforation, tamponade, vascular damage, renal failure, MI, stroke, death, inappropriate shocks, and lead dislodgement and wishes to proceed.  We will therefore schedule device implantation on Thursday.      2. CAD s/p CABG No ischemic symptoms Cath reviewed, medical therapy advised EF 45-50% by echo  3. HTN Looks good    For questions or updates, please contact [place] Please consult [email or web address] for contact info under        Signed, Vickie Epley, MD  06/24/2020, 6:50 AM

## 2020-06-25 ENCOUNTER — Inpatient Hospital Stay (HOSPITAL_COMMUNITY)
Admission: EM | Disposition: A | Discharge status: Home / Self Care | Payer: Self-pay | Source: Ambulatory Visit | Attending: Cardiology

## 2020-06-25 DIAGNOSIS — I472 Ventricular tachycardia: Secondary | ICD-10-CM

## 2020-06-25 DIAGNOSIS — I255 Ischemic cardiomyopathy: Secondary | ICD-10-CM

## 2020-06-25 HISTORY — PX: ICD IMPLANT: EP1208

## 2020-06-25 SURGERY — ICD IMPLANT

## 2020-06-25 MED ORDER — LIDOCAINE HCL 1 % IJ SOLN
INTRAMUSCULAR | Status: AC
  Filled 2020-06-25: qty 20

## 2020-06-25 MED ORDER — VANCOMYCIN HCL IN DEXTROSE 1-5 GM/200ML-% IV SOLN
INTRAVENOUS | Status: AC
  Filled 2020-06-25: qty 200

## 2020-06-25 MED ORDER — HEPARIN (PORCINE) IN NACL 1000-0.9 UT/500ML-% IV SOLN
INTRAVENOUS | Status: DC | PRN
  Administered 2020-06-25: 500 mL

## 2020-06-25 MED ORDER — ONDANSETRON HCL 4 MG/2ML IJ SOLN: 4.0000 mg | Freq: Four times a day (QID) | INTRAMUSCULAR | Status: DC | PRN

## 2020-06-25 MED ORDER — SODIUM CHLORIDE 0.9 % IV SOLN: 250.0000 mL | INTRAVENOUS | Status: DC

## 2020-06-25 MED ORDER — LIDOCAINE HCL (PF) 1 % IJ SOLN
INTRAMUSCULAR | Status: DC | PRN
  Administered 2020-06-25: 45 mL

## 2020-06-25 MED ORDER — CHLORHEXIDINE GLUCONATE 4 % EX LIQD
CUTANEOUS | Status: AC
  Administered 2020-06-25: 4 via TOPICAL
  Filled 2020-06-25: qty 60

## 2020-06-25 MED ORDER — ACETAMINOPHEN 325 MG PO TABS: 325.0000 mg | ORAL_TABLET | ORAL | Status: DC | PRN

## 2020-06-25 MED ORDER — HEPARIN (PORCINE) IN NACL 1000-0.9 UT/500ML-% IV SOLN
INTRAVENOUS | Status: AC
  Filled 2020-06-25: qty 500

## 2020-06-25 MED ORDER — FENTANYL CITRATE (PF) 100 MCG/2ML IJ SOLN
INTRAMUSCULAR | Status: DC | PRN
  Administered 2020-06-25 (×2): 25 ug via INTRAVENOUS

## 2020-06-25 MED ORDER — FENTANYL CITRATE (PF) 100 MCG/2ML IJ SOLN
INTRAMUSCULAR | Status: AC
  Filled 2020-06-25: qty 2

## 2020-06-25 MED ORDER — SODIUM CHLORIDE 0.9 % IV SOLN: INTRAVENOUS | Status: DC

## 2020-06-25 MED ORDER — VANCOMYCIN HCL IN DEXTROSE 1-5 GM/200ML-% IV SOLN
1000.0000 mg | Freq: Two times a day (BID) | INTRAVENOUS | Status: AC
  Administered 2020-06-26: 1000 mg via INTRAVENOUS
  Filled 2020-06-25: qty 200

## 2020-06-25 MED ORDER — SODIUM CHLORIDE 0.9 % IV SOLN
INTRAVENOUS | Status: AC
  Filled 2020-06-25: qty 2

## 2020-06-25 MED ORDER — MIDAZOLAM HCL 5 MG/5ML IJ SOLN
INTRAMUSCULAR | Status: AC
  Filled 2020-06-25: qty 5

## 2020-06-25 MED ORDER — MIDAZOLAM HCL 5 MG/5ML IJ SOLN
INTRAMUSCULAR | Status: DC | PRN
  Administered 2020-06-25 (×2): 1 mg via INTRAVENOUS

## 2020-06-25 SURGICAL SUPPLY — 7 items
CABLE SURGICAL S-101-97-12 (CABLE) ×2 IMPLANT
ICD VISIA MRI VR DVFB1D4 (ICD Generator) IMPLANT
LEAD SPRINT QUAT SEC 6935M-62 (Lead) ×1 IMPLANT
PAD PRO RADIOLUCENT 2001M-C (PAD) ×2 IMPLANT
SHEATH 9.5FR PRELUDE SNAP 13 (SHEATH) ×1 IMPLANT
TRAY PACEMAKER INSERTION (PACKS) ×2 IMPLANT
VISIA MRI VR DVFB1D4 (ICD Generator) ×2 IMPLANT

## 2020-06-25 NOTE — Plan of Care (Signed)
  Problem: Clinical Measurements: Goal: Ability to maintain clinical measurements within normal limits will improve Outcome: Progressing   Problem: Clinical Measurements: Goal: Will remain free from infection Outcome: Progressing   

## 2020-06-25 NOTE — Progress Notes (Signed)
Progress Note  [name] Date of Encounter: 06/25/2020  [facility] Cardiologist: [name], MD   Subjective   Feels well.  No CP, SOB  Inpatient Medications    Scheduled Meds: . aspirin EC  81 mg Oral Daily  . carvedilol  6.25 mg Oral BID WC  . chlorhexidine  60 mL Topical Once  . citalopram  10 mg Oral q1800  . colestipol  2 g Oral BID  . empagliflozin  25 mg Oral Daily  . finasteride  5 mg Oral Daily  . gentamicin irrigation  80 mg Irrigation To Cath  . linagliptin  5 mg Oral Daily  . losartan  25 mg Oral q morning - 10a  . nystatin   Topical BID  . pantoprazole  40 mg Oral q morning - 10a  . rosuvastatin  40 mg Oral q1800  . sodium chloride flush  3 mL Intravenous Q12H  . vitamin B-12  500 mcg Oral Daily   Continuous Infusions: . sodium chloride 50 mL/hr at 06/25/20 0615  . sodium chloride    . vancomycin     PRN Meds: acetaminophen, fluticasone, loratadine, ondansetron (ZOFRAN) IV, sodium chloride flush, sodium chloride flush   Vital Signs    Vitals:   06/24/20 1417 06/24/20 1633 06/24/20 2050 06/25/20 0605  BP: 99/62 101/67 113/65 98/69  Pulse: 67 68 64 67  Resp: 20   18  Temp: 97.8 F (36.6 C)  97.9 F (36.6 C) 97.7 F (36.5 C)  TempSrc: Oral  Oral Oral  SpO2: 98%  99% 98%  Weight:    97.7 kg  Height:        Intake/Output Summary (Last 24 hours) at 06/25/2020 0755 Last data filed at 06/24/2020 2000 Gross per 24 hour  Intake 475 ml  Output --  Net 475 ml   Last 3 Weights 06/25/2020 06/24/2020 06/22/2020  Weight (lbs) 215 lb 6.4 oz 216 lb 4.3 oz 220 lb  Weight (kg) 97.705 kg 98.1 kg 99.791 kg      Telemetry    SR 60's, rare PVCs, no arrhythmias - Personally Reviewed  ECG    No new EKGs - Personally Reviewed  Physical Exam   GEN: No acute distress.   Neck: No JVD Cardiac: RRR, no murmurs, rubs, or gallops.  Respiratory: CTA b/l. GI: Soft, nontender, non-distended  MS: No edema; No  deformity. Neuro:  Nonfocal  Psych: Normal affect    Labs    High Sensitivity Troponin:   Recent Labs  Lab 06/19/20 0833 06/19/20 1535  TROPONINIHS 237* 387*      Chemistry Recent Labs  Lab 06/22/20 0845 06/23/20 0209 06/24/20 0145  NA 137 139 137  K 3.9 4.5 4.7  CL 102 102 99  CO2 28 26 29   GLUCOSE 128* 149* 138*  BUN 11 16 19   CREATININE 0.88 1.10 1.16  CALCIUM 9.9 9.8 10.4*  GFRNONAA >60 >60 >60  ANIONGAP 7 11 9      Hematology Recent Labs  Lab 06/19/20 0833 06/19/20 1535 06/20/20 0252  WBC 11.3* 9.1 7.3  RBC 5.70 5.46 5.05  HGB 16.4 15.5 14.6  HCT 49.9 48.0 44.9  MCV 87.5 87.9 88.9  MCH 28.8 28.4 28.9  MCHC 32.9 32.3 32.5  RDW 13.9 14.0 14.1  PLT 166 141* 122*    BNPNo results for input(s): BNP, PROBNP in the last 168 hours.   DDimer No results for input(s): DDIMER in the last 168 hours.   Radiology  Cardiac Studies   06/22/2020: EPS/Ablation CONCLUSIONS: 1. Sinus rhythm upon presentation   2. Clinical VT noninducible. 3. Nonclinical VT induced and was not hemodynamically tolerated (positive precordial concordance, inferior axis, CL 370ms) 4. Successful scar modification along the mid inferior wall extending to the lateral wall 5. A large inferior and inferolateral LV scar on ICE and 3d electroanatomic mapping. 6. No early apparent complications. 7. ICD discussion with the patient and family  06/19/2020; [place] LAD lesion is 80% stenosed.  Prox LAD to Mid LAD lesion is 90% stenosed.  Mid LAD lesion is 95% stenosed.  Mid RCA lesion is 85% stenosed.  Mid LM to Dist LM lesion is 70% stenosed.  Prox Cx to Mid Cx lesion is 85% stenosed.  Mid Cx to Dist Cx lesion is 95% stenosed.  Non-stenotic Prox RCA lesion.  Prox Graft lesion is 20% stenosed.   Severe native multivessel CAD with 70% distal left main stenosis, 80% ostial LAD stenosis followed by segmental 90% stenoses before and after the first diagonal vessel with 95%  stenosis in the midsegment proximal to a septal perforating artery with competitive filling seen distally; diffuse 80 to 95% circumflex stenoses; ectatic proximal RCA with 80% mid stenoses.  Patent LIMA graft supplying the mid LAD.  Patent SVG supplying the distal circumflex marginal vessel branch.  Patent SVG supplying the distal RCA with mild smooth 20% focal proximal to mid narrowing.  Fairly preserved global LV function with EF estimated 50 to 55%.  There is a suggestion of possible apical hypertrophy with nipple protrusion of the apex.  LVEDP 77mmHg.  RECOMMENDATION: The patient's bypass conduits are widely patent.  Further EP evaluation for possible defibrillator or VT ablation.    06/20/2020: TTE IMPRESSIONS  1. Inferior and inferoseptal hypokinesis. Left ventricular ejection  fraction, by estimation, is 45 to 50%. The left ventricle has mildly  decreased function. The left ventricle demonstrates regional wall motion  abnormalities (see scoring diagram/findings  for description). There is mild concentric left ventricular hypertrophy.  Left ventricular diastolic parameters are consistent with Grade II  diastolic dysfunction (pseudonormalization).  2. Right ventricular systolic function is normal. The right ventricular  size is normal.  3. Left atrial size was moderately dilated.  4. The mitral valve is normal in structure. Trivial mitral valve  regurgitation. No evidence of mitral stenosis. Moderate mitral annular  calcification.  5. The aortic valve is calcified. There is mild calcification of the  aortic valve. There is mild thickening of the aortic valve. Aortic valve  regurgitation is not visualized. No aortic stenosis is present.  6. The inferior vena cava is dilated in size with <50% respiratory  variability, suggesting right atrial pressure of 15 mmHg.    Patient Profile     74 y.o. male w/PMHx of CAD (CABG 03/2019), HTN, DM, HLD sought attention at his  PMD for palpitations, weakness, found in [facility] brought to [facility] via EMS, DCCV in the ER, admitted for further evaluation and management  Assessment & Plan    1.  VT          EF 45-50% (by echo) 50-55% (by LV gram) S/p EPS/ablation 06/22/20 ICD implant today, f/u questions answered today, patient remains agreeable,   2. CAD s/p CABG No ischemic symptoms Works a very labor intensive job, denies any exertional intolerances, symptoms Cath reviewed, medical therapy advised EF 45-50% by echo, 50-55% by LV gram  3. HTN Looks good  Anticipate discharge tomorrow    For questions or  updates, please contact [place] Please consult [email or web address] for contact info under        Signed, Baldwin Jamaica, PA-C  06/25/2020, 7:55 AM

## 2020-06-25 NOTE — Discharge Summary (Signed)
DISCHARGE SUMMARY    Patient ID: GIOVANNE NICKOLSON,  MRN: [id number], DOB/AGE: [birth year] 69 y.o.  Admit date: 06/19/2020 Discharge date: 06/26/2020  Primary Care Physician: [name], MD  Primary Cardiologist: Dr. Meda Coffee Electrophysiologist: new, Dr. Quentin Ore  Primary Discharge Diagnosis:  1. VT  Secondary Discharge Diagnosis:  1. CAD 2. HTN 3. DM  Allergies  Allergen Reactions  . Doxazosin Mesylate Other (See Comments)    Drops in blood pressure   . Flomax [Tamsulosin Hcl] Itching and Other (See Comments)    Drops in blood pressure    . Penicillins Rash and Other (See Comments)    Has patient had a PCN reaction causing immediate rash, facial/tongue/throat swelling, SOB or lightheadedness with hypotension: No Has patient had a PCN reaction causing severe rash involving mucus membranes or skin necrosis: No Has patient had a PCN reaction that required hospitalization No Has patient had a PCN reaction occurring within the last 10 years: No If all of the above answers are "NO", then may proceed with Cephalosporin use.   . Sulfonamide Derivatives Rash  . Trulicity [Dulaglutide] Nausea And Vomiting    N&V, Dizziness     Procedures This Admission:  1. 06/19/2020: LHC  Ost LAD lesion is 80% stenosed.  Prox LAD to Mid LAD lesion is 90% stenosed.  Mid LAD lesion is 95% stenosed.  Mid RCA lesion is 85% stenosed.  Mid LM to Dist LM lesion is 70% stenosed.  Prox Cx to Mid Cx lesion is 85% stenosed.  Mid Cx to Dist Cx lesion is 95% stenosed.  Non-stenotic Prox RCA lesion.  Prox Graft lesion is 20% stenosed.   Severe native multivessel CAD with 70% distal left main stenosis, 80% ostial LAD stenosis followed by segmental 90% stenoses before and after the first diagonal vessel with 95% stenosis in the midsegment proximal to a septal perforating artery with competitive filling seen distally; diffuse 80 to 95% circumflex stenoses; ectatic proximal RCA with  80% mid stenoses.  Patent LIMA graft supplying the mid LAD.  Patent SVG supplying the distal circumflex marginal vessel branch.  Patent SVG supplying the distal RCA with mild smooth 20% focal proximal to mid narrowing.  Fairly preserved global LV function with EF estimated 50 to 55%.  There is a suggestion of possible apical hypertrophy with nipple protrusion of the apex.  LVEDP 36mmHg.   2. 06/22/2020: EPS/ablation CONCLUSIONS: 1. Sinus rhythm upon presentation   2. Clinical VT noninducible. 3. Nonclinical VT induced and was not hemodynamically tolerated (positive precordial concordance, inferior axis, CL 381ms) 4. Successful scar modification along the mid inferior wall extending to the lateral wall 5. A large inferior and inferolateral LV scar on ICE and 3d electroanatomic mapping. 6. No early apparent complications. 7. ICD discussion with the patient and family  3. Implantation of a MDT single chamber ICD on 06/25/2020 by Dr Quentin Ore.  The patient received a  (model [id number], serial [id number]) (RV lead), pulse generator (model [id number], serial [id number] DFT's were deferred at time of implant There were no immediate post procedure complications. CXR on 06/26/2020 demonstrated no pneumothorax status post device implantation.   Brief HPI: IDO WOLLMAN is a 74 y.o. male w/PMHx of of chronic systolic CHF (most recent EF 45-50% 02/2020), CAD s/p CABG 03/2019, HTN, DM2, HLD presented to PCP office the day of his admission around 0730 stating that his heart was racing. Pulse ox there showed HRs in 190s with BP 100/60 and SPo2 at 91%. He  denied chest pain. Did report feeling dizzy and weak. He denies any syncope or near syncope. His heart started racing last night. EMS arrived and noted WCT.    Hospital Course:  The patient initially treated with amiodarone given stable BP though ultimately CV in the ER. He was admitted, labs largely unremarkable, not felt to have ACS with mildly  abnormal HS Trop, likely demand though planned for LHC given hx of CABG/CAD. His cath noted advanced CAD but patent grafts, medical therapy was advised.  Amiodarone was stopped and he underwent EPS/Ablation with scar modification.  In discussion with the patient and his family with Dr. Quentin Ore, was recommended and decided to pursue ICD Implant.  He was monitored on telemetry throughout his stay maintaining SR without recurrent VT, no arrhythmias. Was decided not to continue AAD at this juncture .  Left chest was without hematoma or ecchymosis.  The device was interrogated and found to be functioning normally.  CXR was obtained and demonstrated no pneumothorax status post device implantation.  Wound care, arm mobility, and restrictions were reviewed with the patient.  The patient feels very well, denies any CP or SOB, minimal site discomfort, he was examined by Dr. Quentin Ore and considered stable for discharge to home.   He reports he has been off Plavix for quite some time, was told to stop after his 1 year post CABG I have instructed the patient no driving until cleared by Dr. Quentin Ore given his presenting issue was VT, though no syncope and hemodynamically stable did require DCCV    Physical Exam: Vitals:   06/25/20 1727 06/25/20 2100 06/26/20 0440 06/26/20 0452  BP: 107/66 98/85 112/65   Pulse: 65 65 67   Resp:  20 18   Temp: 97.7 F (36.5 C) 98.1 F (36.7 C) 98.7 F (37.1 C)   TempSrc: Oral Oral Oral   SpO2: 95% 97% 97%   Weight:    97.3 kg  Height:        GEN- The patient is well appearing, alert and oriented x 3 today.   HEENT: normocephalic, atraumatic; sclera clear, conjunctiva pink; hearing intact; oropharynx clear Lungs-  CTA b/l, normal work of breathing.  No wheezes, rales, rhonchi Heart- RRR, no murmurs, rubs or gallops, PMI not laterally displaced GI- soft, non-tender, non-distended Extremities- no clubbing, cyanosis, or edema, b/l groins are soft, nontender, no  bleeding/stable MS- no significant deformity or atrophy Skin- warm and dry, no rash or lesion, left chest without hematoma/ecchymosis Psych- euthymic mood, full affect Neuro- no gross defecits  Labs:   Lab Results  Component Value Date   WBC 7.3 06/20/2020   HGB 14.6 06/20/2020   HCT 44.9 06/20/2020   MCV 88.9 06/20/2020   PLT 122 (L) 06/20/2020    Recent Labs  Lab 06/24/20 0145  NA 137  K 4.7  CL 99  CO2 29  BUN 19  CREATININE 1.16  CALCIUM 10.4*  GLUCOSE 138*    Discharge Medications:  Allergies as of 06/26/2020      Reactions   Doxazosin Mesylate Other (See Comments)   Drops in blood pressure    Flomax [tamsulosin Hcl] Itching, Other (See Comments)   Drops in blood pressure    Penicillins Rash, Other (See Comments)   Has patient had a PCN reaction causing immediate rash, facial/tongue/throat swelling, SOB or lightheadedness with hypotension: No Has patient had a PCN reaction causing severe rash involving mucus membranes or skin necrosis: No Has patient had a PCN reaction that required  hospitalization No Has patient had a PCN reaction occurring within the last 10 years: No If all of the above answers are "NO", then may proceed with Cephalosporin use.   Sulfonamide Derivatives Rash   Trulicity [dulaglutide] Nausea And Vomiting   N&V, Dizziness      Medication List    STOP taking these medications   clopidogrel 75 MG tablet Commonly known as: PLAVIX     TAKE these medications   acetaminophen 500 MG tablet Commonly known as: TYLENOL Take 1,000 mg by mouth at bedtime as needed for mild pain.   aspirin EC 81 MG tablet Take 1 tablet (81 mg total) by mouth daily.   carvedilol 6.25 MG tablet Commonly known as: COREG Take 1 tablet (6.25 mg total) by mouth 2 (two) times daily with a meal. What changed:   medication strength  how much to take   citalopram 10 MG tablet Commonly known as: CELEXA Take 1 tablet (10 mg total) by mouth daily.   colestipol 5  g packet Commonly known as: COLESTID Take 5 g by mouth daily.   fexofenadine 180 MG tablet Commonly known as: ALLEGRA Take 1 tablet (180 mg total) by mouth daily as needed. For allergies   finasteride 5 MG tablet Commonly known as: PROSCAR Take 1 tablet (5 mg total) by mouth daily.   fluticasone 50 MCG/ACT nasal spray Commonly known as: FLONASE SPRAY 2 SPRAYS INTO EACH NOSTRIL EVERY DAY What changed: See the new instructions.   Hydrocortisone Acetate 2.5 % Crea Apply 1 application topically in the morning and at bedtime.   Jardiance 25 MG Tabs tablet Generic drug: empagliflozin TAKE 25 MG (1 TABLET) BY MOUTH DAILY BEFORE BREAKFAST. STOP PIOGLITAZONE What changed: See the new instructions.   ketotifen 0.025 % ophthalmic solution Commonly known as: ZADITOR Place 1 drop into both eyes daily as needed (For itching).   losartan 25 MG tablet Commonly known as: COZAAR Take 1 tablet (25 mg total) by mouth daily.   loteprednol 0.5 % ophthalmic suspension Commonly known as: Lotemax Place 1 drop into both eyes 4 (four) times daily. What changed:   when to take this  reasons to take this   nystatin cream Commonly known as: MYCOSTATIN Apply 1 application topically 3 (three) times daily.   pantoprazole 40 MG tablet Commonly known as: PROTONIX TAKE 1 TABLET (40 MG TOTAL) BY MOUTH EVERY MORNING.   rosuvastatin 40 MG tablet Commonly known as: CRESTOR TAKE 1 TABLET (40 MG TOTAL) BY MOUTH DAILY AT 6 PM.   sitaGLIPtin 50 MG tablet Commonly known as: Januvia Take 1 tablet (50 mg total) by mouth daily.   vitamin B-12 500 MCG tablet Commonly known as: CYANOCOBALAMIN Take 500 mcg by mouth daily.   vitamin C 100 MG tablet Take 100 mg by mouth daily.       Disposition: Home Discharge Instructions    Diet - low sodium heart healthy   Complete by: As directed    Increase activity slowly   Complete by: As directed       Follow-up Information    Vickie Epley, MD  Follow up.   Specialties: Cardiology, Radiology Why: 09/29/2019 @ 3:30PM Contact information: [address] [place] [address] [phone]        [facility] Follow up.   Specialty: Cardiology Why: 07/09/2020 @ 2:40PM, wound check visit Contact information: [address], [place] [phone]  Duration of Discharge Encounter: Greater than 30 minutes including physician time.  Venetia Night, PA-C 06/26/2020 8:42 AM

## 2020-06-25 NOTE — Plan of Care (Signed)
  Problem: Clinical Measurements: Goal: Ability to maintain clinical measurements within normal limits will improve 06/25/2020 2100 by Ethel Rana, RN Outcome: Progressing 06/25/2020 1651 by Ethel Rana, RN Outcome: Progressing   Problem: Clinical Measurements: Goal: Will remain free from infection 06/25/2020 2100 by Ethel Rana, RN Outcome: Progressing 06/25/2020 1651 by Ethel Rana, RN Outcome: Progressing

## 2020-06-26 ENCOUNTER — Ambulatory Visit: Payer: Medicare HMO | Admitting: Endocrinology

## 2020-06-26 ENCOUNTER — Encounter (HOSPITAL_COMMUNITY): Payer: Self-pay | Admitting: Cardiology

## 2020-06-26 ENCOUNTER — Inpatient Hospital Stay (HOSPITAL_COMMUNITY): Payer: Medicare HMO

## 2020-06-26 MED ORDER — CARVEDILOL 6.25 MG PO TABS
6.2500 mg | ORAL_TABLET | Freq: Two times a day (BID) | ORAL | 6 refills | Status: DC
Start: 2020-06-26 — End: 2020-09-28

## 2020-06-26 MED FILL — Lidocaine HCl Local Inj 1%: INTRAMUSCULAR | Qty: 60 | Status: AC

## 2020-06-26 NOTE — Progress Notes (Signed)
Discharge instructions (including medications) discussed with and copy provided to patient/caregiver 

## 2020-06-26 NOTE — Discharge Instructions (Signed)
Groin care instructions No vigorous or sexual activity for 1 week. Keep procedure site clean & dry. If you notice increased pain, swelling, bleeding or pus, call/return!  You may shower, but no soaking baths/hot tubs/pools for 1 week.      Supplemental Discharge Instructions for  Pacemaker/Defibrillator Patients   Activity No heavy lifting or vigorous activity with your left/right arm for 6 to 8 weeks.  Do not raise your left/right arm above your head for one week.  Gradually raise your affected arm as drawn below.             06/29/2020                  06/30/2020              07/01/2020               07/02/2020 __  NO DRIVING until cleared to by Dr. Quentin Ore  WOUND CARE - Keep the wound area clean and dry.  Do not get this area wet , no showers for one week; you may shower on 07/02/2020  . - The tape/steri-strips on your wound will fall off; do not pull them off.  No bandage is needed on the site.  DO  NOT apply any creams, oils, or ointments to the wound area. - If you notice any drainage or discharge from the wound, any swelling or bruising at the site, or you develop a fever > 101? F after you are discharged home, call the office at once.  Special Instructions - You are still able to use cellular telephones; use the ear opposite the side where you have your pacemaker/defibrillator.  Avoid carrying your cellular phone near your device. - When traveling through airports, show security personnel your identification card to avoid being screened in the metal detectors.  Ask the security personnel to use the hand wand. - Avoid arc welding equipment, MRI testing (magnetic resonance imaging), TENS units (transcutaneous nerve stimulators).  Call the office for questions about other devices. - Avoid electrical appliances that are in poor condition or are not properly grounded. - Microwave ovens are safe to be near or to operate.

## 2020-06-26 NOTE — Plan of Care (Signed)
  Problem: Education: Goal: Knowledge of General Education information will improve Description: Including pain rating scale, medication(s)/side effects and non-pharmacologic comfort measures Outcome: Adequate for Discharge   

## 2020-07-02 ENCOUNTER — Other Ambulatory Visit: Payer: Self-pay

## 2020-07-02 ENCOUNTER — Ambulatory Visit (INDEPENDENT_AMBULATORY_CARE_PROVIDER_SITE_OTHER): Payer: Medicare HMO | Admitting: Family Medicine

## 2020-07-02 DIAGNOSIS — Z23 Encounter for immunization: Secondary | ICD-10-CM | POA: Diagnosis not present

## 2020-07-09 ENCOUNTER — Other Ambulatory Visit: Payer: Self-pay

## 2020-07-09 ENCOUNTER — Ambulatory Visit: Payer: Medicare HMO | Admitting: Endocrinology

## 2020-07-09 ENCOUNTER — Ambulatory Visit (INDEPENDENT_AMBULATORY_CARE_PROVIDER_SITE_OTHER): Payer: Medicare HMO | Admitting: Emergency Medicine

## 2020-07-09 DIAGNOSIS — I472 Ventricular tachycardia, unspecified: Secondary | ICD-10-CM

## 2020-07-09 LAB — CUP PACEART INCLINIC DEVICE CHECK
Battery Remaining Longevity: 137 mo
Battery Voltage: 3.17 V
Brady Statistic RV Percent Paced: 0.02 %
Date Time Interrogation Session: 20211111151721
HighPow Impedance: 63 Ohm
Implantable Lead Implant Date: 20211028
Implantable Lead Location: 753860
Implantable Pulse Generator Implant Date: 20211028
Lead Channel Impedance Value: 437 Ohm
Lead Channel Impedance Value: 513 Ohm
Lead Channel Pacing Threshold Amplitude: 0.5 V
Lead Channel Pacing Threshold Pulse Width: 0.4 ms
Lead Channel Sensing Intrinsic Amplitude: 19 mV
Lead Channel Sensing Intrinsic Amplitude: 19.75 mV
Lead Channel Setting Pacing Amplitude: 3.5 V
Lead Channel Setting Pacing Pulse Width: 0.4 ms
Lead Channel Setting Sensing Sensitivity: 0.3 mV

## 2020-07-09 NOTE — Progress Notes (Signed)
Wound check appointment. Steri-strips removed. Wound without redness or edema. Incision edges approximated, wound well healed. Normal device function. Thresholds, sensing, and impedances consistent with implant measurements. Device programmed at 3.5V for extra safety margin until 3 month visit. Histogram distribution appropriate for patient and level of activity. No ventricular arrhythmias noted. Patient educated about wound care, arm mobility, lifting restrictions, shock plan. Patient enrolled in remote monitoring, next scheduled check 09/24/20.  ROV with Dr. Quentin Ore 09/28/20.

## 2020-07-12 ENCOUNTER — Other Ambulatory Visit: Payer: Self-pay | Admitting: Family Medicine

## 2020-07-17 ENCOUNTER — Other Ambulatory Visit: Payer: Self-pay

## 2020-07-17 ENCOUNTER — Ambulatory Visit (INDEPENDENT_AMBULATORY_CARE_PROVIDER_SITE_OTHER): Payer: Medicare HMO | Admitting: Family Medicine

## 2020-07-17 VITALS — BP 110/58 | HR 70 | Temp 97.6°F | Ht 71.0 in | Wt 220.0 lb

## 2020-07-17 DIAGNOSIS — M109 Gout, unspecified: Secondary | ICD-10-CM | POA: Diagnosis not present

## 2020-07-17 MED ORDER — PREDNISONE 20 MG PO TABS: ORAL_TABLET | ORAL | 0 refills | Status: DC

## 2020-07-17 NOTE — Progress Notes (Signed)
This was a Flu vac and it was put on the provider's schedule. Could you please sign off.  Thank you

## 2020-07-17 NOTE — Progress Notes (Signed)
Subjective:    Patient ID: Cameron Daniel, male    DOB: 1946-04-17, 74 y.o.   MRN: [id number]  HPI  Last time I saw the patient he was in ventricular tachycardia!.  He was admitted to the hospital and underwent cardioversion and had a defibrillator placed.  He has been doing extremely well since hospital discharge however he developed a gout exacerbation in his left ankle.  Last evening, he woke up and the left medial malleolus was red hot and extremely painful.  He did not even want to touch his ankle it hurts so bad.  His last gout exacerbation was approximately 6 years ago.  It hurt to bear weight on his ankle.  He started drinking cherry juice and the pain is somewhat better however the ankle is still swollen red to the touch and painful.  There is no evidence of pitting edema above the ankle.  There is a negative Bevelyn Buckles' sign today. Past Medical History:  Diagnosis Date  . Allergic rhinitis, cause unspecified   . Allergy   . Anxiety   . Arthritis   . Cancer ([place])   . Carotid Doppler 04/2020   Carotid US 9/21: No evidence of ICA stenosis bilaterally; right vertebral artery stenosis  . Cataract   . Chronic systolic CHF (congestive heart failure) ([place])   . Coronary atherosclerosis of unspecified type of vessel, native or graft    a. MI 1985 with unclear details. b. CABG 03/2019 with LIMA -> LAD, SVG -> OM, SVG to dRCA.  . Depression    PTSD   . Diverticulosis of colon (without mention of hemorrhage)   . Esophageal reflux   . Esophageal stricture   . Essential hypertension   . Former tobacco use   . Hemorrhoids   . Hyperlipidemia   . Irritable bowel syndrome   . Ischemic cardiomyopathy   . Mild carotid artery disease (HCC)    a. 1-39% bilaterally 03/2019 duplex.  . Myocardial infarction ([place])   . Nocturia   . Obesity, unspecified   . Orthostasis   . Other acquired absence of organ   . Personal history of colonic polyps   . Psychosexual dysfunction with inhibited sexual  excitement   . Retroperitoneal bleed   . Type II or unspecified type diabetes mellitus without mention of complication, not stated as uncontrolled    Past Surgical History:  Procedure Laterality Date  . ADENOIDECTOMY    . CARPAL TUNNEL RELEASE     left   . CHOLECYSTECTOMY    . COLON RESECTION     18 inches  . COLON SURGERY  2013  . CORONARY ANGIOPLASTY     1985   . CORONARY ARTERY BYPASS GRAFT N/A 04/26/2019   Procedure: CORONARY ARTERY BYPASS GRAFTING (CABG) x Three, using left internal mammary artery and right leg greater saphenous vein harvested endoscopically;  Surgeon: Gaye Pollack, MD;  Location: [facility];  Service: Open Heart Surgery;  Laterality: N/A;  . ELBOW BURSA SURGERY    . EYE SURGERY    . ICD IMPLANT N/A 06/25/2020   Procedure: ICD IMPLANT;  Surgeon: Vickie Epley, MD;  Location: [facility];  Service: Cardiovascular;  Laterality: N/A;  . LEFT HEART CATH AND CORS/GRAFTS ANGIOGRAPHY N/A 06/19/2020   Procedure: LEFT HEART CATH AND CORS/GRAFTS ANGIOGRAPHY;  Surgeon: Troy Sine, MD;  Location: [facility];  Service: Cardiovascular;  Laterality: N/A;  . POLYPECTOMY    . RIGHT/LEFT HEART CATH AND CORONARY ANGIOGRAPHY  N/A 04/16/2019   Procedure: RIGHT/LEFT HEART CATH AND CORONARY ANGIOGRAPHY;  Surgeon: Belva Crome, MD;  Location: [facility];  Service: Cardiovascular;  Laterality: N/A;  . SHOULDER SURGERY     Rt. Shoulder  . TEE WITHOUT CARDIOVERSION N/A 04/26/2019   Procedure: TRANSESOPHAGEAL ECHOCARDIOGRAM (TEE);  Surgeon: Gaye Pollack, MD;  Location: [facility];  Service: Open Heart Surgery;  Laterality: N/A;  . TONSILLECTOMY    . V TACH ABLATION N/A 06/22/2020   Procedure: V TACH ABLATION;  Surgeon: Vickie Epley, MD;  Location: [facility];  Service: Cardiovascular;  Laterality: N/A;   Current Outpatient Medications on File Prior to Visit  Medication Sig Dispense Refill  . acetaminophen (TYLENOL) 500 MG tablet Take 1,000 mg by  mouth at bedtime as needed for mild pain.    . Ascorbic Acid (VITAMIN C) 100 MG tablet Take 100 mg by mouth daily.    Marland Kitchen aspirin EC 81 MG tablet Take 1 tablet (81 mg total) by mouth daily. 90 tablet 3  . carvedilol (COREG) 6.25 MG tablet Take 1 tablet (6.25 mg total) by mouth 2 (two) times daily with a meal. 60 tablet 6  . citalopram (CELEXA) 10 MG tablet Take 1 tablet (10 mg total) by mouth daily. 90 tablet 2  . colestipol (COLESTID) 5 g packet Take 5 g by mouth daily.     . fexofenadine (ALLEGRA) 180 MG tablet Take 1 tablet (180 mg total) by mouth daily as needed. For allergies 90 tablet 0  . finasteride (PROSCAR) 5 MG tablet Take 1 tablet (5 mg total) by mouth daily. 90 tablet 3  . fluticasone (FLONASE) 50 MCG/ACT nasal spray SPRAY 2 SPRAYS INTO EACH NOSTRIL EVERY DAY (Patient taking differently: Place 2 sprays into both nostrils daily. ) 48 mL 2  . JARDIANCE 25 MG TABS tablet TAKE 25 MG (1 TABLET) BY MOUTH DAILY BEFORE BREAKFAST. STOP PIOGLITAZONE (Patient taking differently: Take 25 mg by mouth daily. ) 90 tablet 1  . ketotifen (ZADITOR) 0.025 % ophthalmic solution Place 1 drop into both eyes daily as needed (For itching).    Marland Kitchen losartan (COZAAR) 25 MG tablet Take 1 tablet (25 mg total) by mouth daily. 90 tablet 1  . loteprednol (LOTEMAX) 0.5 % ophthalmic suspension Place 1 drop into both eyes 4 (four) times daily. (Patient taking differently: Place 1 drop into both eyes 4 (four) times daily as needed (itching). ) 5 mL 0  . pantoprazole (PROTONIX) 40 MG tablet TAKE 1 TABLET BY MOUTH EVERY DAY IN THE MORNING 90 tablet 3  . rosuvastatin (CRESTOR) 40 MG tablet TAKE 1 TABLET (40 MG TOTAL) BY MOUTH DAILY AT 6 PM. 90 tablet 3  . sitaGLIPtin (JANUVIA) 50 MG tablet Take 1 tablet (50 mg total) by mouth daily. 30 tablet 5  . vitamin B-12 (CYANOCOBALAMIN) 500 MCG tablet Take 500 mcg by mouth daily.    . Hydrocortisone Acetate 2.5 % CREA Apply 1 application topically in the morning and at bedtime. (Patient  not taking: Reported on 06/19/2020) 30 g 1  . nystatin cream (MYCOSTATIN) Apply 1 application topically 3 (three) times daily. (Patient not taking: Reported on 06/19/2020) 60 g 0   No current facility-administered medications on file prior to visit.   Allergies  Allergen Reactions  . Doxazosin Mesylate Other (See Comments)    Drops in blood pressure   . Flomax [Tamsulosin Hcl] Itching and Other (See Comments)    Drops in blood pressure    .  Penicillins Rash and Other (See Comments)    Has patient had a PCN reaction causing immediate rash, facial/tongue/throat swelling, SOB or lightheadedness with hypotension: No Has patient had a PCN reaction causing severe rash involving mucus membranes or skin necrosis: No Has patient had a PCN reaction that required hospitalization No Has patient had a PCN reaction occurring within the last 10 years: No If all of the above answers are "NO", then may proceed with Cephalosporin use.   . Sulfonamide Derivatives Rash  . Trulicity [Dulaglutide] Nausea And Vomiting    N&V, Dizziness   Social History   Socioeconomic History  . Marital status: Married    Spouse name: Not on file  . Number of children: 1  . Years of education: 8  . Highest education level: High school graduate  Occupational History  . 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 Smoking status: Former Smoker    Quit date: 08/30/1983    Years since quitting: 36.9  . Smokeless tobacco: Former Systems developer  . Tobacco comment: Quit 30 years ago.  Vaping Use  . Vaping Use: Never used  Substance and Sexual Activity  . Alcohol use: Yes    Comment: rare  . Drug use: No  . Sexual activity: Yes  Other Topics Concern  . Not on file  Social History Narrative  . Not on file   Social Determinants of Health   Financial Resource Strain:   . Difficulty of Paying Living Expenses: Not on file  Food Insecurity:   . Worried About Charity fundraiser in the Last Year: Not on file  . Ran Out of Food in the  Last Year: Not on file  Transportation Needs:   . Lack of Transportation (Medical): Not on file  . Lack of Transportation (Non-Medical): Not on file  Physical Activity:   . Days of Exercise per Week: Not on file  . Minutes of Exercise per Session: Not on file  Stress:   . Feeling of Stress : Not on file  Social Connections:   . Frequency of Communication with Friends and Family: Not on file  . Frequency of Social Gatherings with Friends and Family: Not on file  . Attends Religious Services: Not on file  . Active Member of Clubs or Organizations: Not on file  . Attends Archivist Meetings: Not on file  . Marital Status: Not on file  Intimate Partner Violence:   . Fear of Current or Ex-Partner: Not on file  . Emotionally Abused: Not on file  . Physically Abused: Not on file  . Sexually Abused: Not on file     Review of Systems     Objective:   Physical Exam Vitals reviewed.  Constitutional:      Appearance: Normal appearance.  Cardiovascular:     Rate and Rhythm: Normal rate and regular rhythm.     Pulses: Normal pulses.     Heart sounds: Normal heart sounds. No murmur heard.   Pulmonary:     Effort: Pulmonary effort is normal.     Breath sounds: Normal breath sounds.  Musculoskeletal:     Left ankle: Swelling present. Tenderness present. Decreased range of motion.  Neurological:     Mental Status: He is alert.           Assessment & Plan:  Exacerbation of gout - Plan: BASIC METABOLIC PANEL WITH GFR  Patient is having a gout attack in his left ankle.  He is unable to tolerate colchicine due to  diarrhea and abdominal upset.  Therefore I will start him on a prednisone taper pack.  This is the first gout attack he has had in several years therefore we will hold off on allopurinol.  I will check a BMP to reassess his renal function to ensure that something has not changed drastically since his hospital admission.

## 2020-07-18 LAB — BASIC METABOLIC PANEL WITH GFR
BUN: 17 mg/dL (ref 7–25)
CO2: 28 mmol/L (ref 20–32)
Calcium: 10.3 mg/dL (ref 8.6–10.3)
Chloride: 99 mmol/L (ref 98–110)
Creat: 0.83 mg/dL (ref 0.70–1.18)
GFR, Est African American: 101 mL/min/{1.73_m2} (ref >=60)
GFR, Est Non African American: 87 mL/min/{1.73_m2} (ref >=60)
Glucose, Bld: 139 mg/dL — ABNORMAL HIGH (ref 65–99)
Potassium: 3.8 mmol/L (ref 3.5–5.3)
Sodium: 137 mmol/L (ref 135–146)

## 2020-07-27 ENCOUNTER — Ambulatory Visit: Payer: Medicare HMO | Admitting: Cardiology

## 2020-07-29 DIAGNOSIS — M1711 Unilateral primary osteoarthritis, right knee: Secondary | ICD-10-CM | POA: Diagnosis not present

## 2020-08-06 ENCOUNTER — Other Ambulatory Visit: Payer: Self-pay

## 2020-08-06 ENCOUNTER — Ambulatory Visit (INDEPENDENT_AMBULATORY_CARE_PROVIDER_SITE_OTHER): Payer: Medicare HMO | Admitting: Endocrinology

## 2020-08-06 ENCOUNTER — Encounter: Payer: Self-pay | Admitting: Endocrinology

## 2020-08-06 DIAGNOSIS — N202 Calculus of kidney with calculus of ureter: Secondary | ICD-10-CM

## 2020-08-06 DIAGNOSIS — N209 Urinary calculus, unspecified: Secondary | ICD-10-CM | POA: Insufficient documentation

## 2020-08-06 LAB — VITAMIN D 25 HYDROXY (VIT D DEFICIENCY, FRACTURES): VITD: 31.15 ng/mL (ref 30.00–100.00)

## 2020-08-06 NOTE — Progress Notes (Signed)
Subjective:    Patient ID: Cameron Daniel, male    DOB: [birth year], 74 y.o.   MRN: [id number]  HPI Pt is referred by Dr Dennard Schaumann, for hypercalcemia.  Pt was dx'ed with Vit-D def in 2012.  He was noted to have hypercalcemia in 2021.  He has never had osteoporosis, thyroid probs, parathyroid probs, sarcoidosis, cancer, PUD, pancreatitis, recent severe injury, or bony fracture.  He does not take vitamin-A supplement.  Pt denies taking antacids, Li++, or HCTZ.  He stopped vit-D 6 weeks ago.  He had partial colectomy in 2013, for diverticulitis.  He reports arthralgias, anxiety, and low back pain.   Past Medical History:  Diagnosis Date  . Allergic rhinitis, cause unspecified   . Allergy   . Anxiety   . Arthritis   . Cancer ([place])   . Carotid Doppler 04/2020   Carotid US 9/21: No evidence of ICA stenosis bilaterally; right vertebral artery stenosis  . Cataract   . Chronic systolic CHF (congestive heart failure) ([place])   . Coronary atherosclerosis of unspecified type of vessel, native or graft    a. MI 1985 with unclear details. b. CABG 03/2019 with LIMA -> LAD, SVG -> OM, SVG to dRCA.  . Depression    PTSD   . Diverticulosis of colon (without mention of hemorrhage)   . Esophageal reflux   . Esophageal stricture   . Essential hypertension   . Former tobacco use   . Hemorrhoids   . Hyperlipidemia   . Irritable bowel syndrome   . Ischemic cardiomyopathy   . Mild carotid artery disease (HCC)    a. 1-39% bilaterally 03/2019 duplex.  . Myocardial infarction ([place])   . Nocturia   . Obesity, unspecified   . Orthostasis   . Other acquired absence of organ   . Personal history of colonic polyps   . Psychosexual dysfunction with inhibited sexual excitement   . Retroperitoneal bleed   . Type II or unspecified type diabetes mellitus without mention of complication, not stated as uncontrolled     Past Surgical History:  Procedure Laterality Date  . ADENOIDECTOMY    . CARPAL TUNNEL RELEASE      left   . CHOLECYSTECTOMY    . COLON RESECTION     18 inches  . COLON SURGERY  2013  . CORONARY ANGIOPLASTY     1985   . CORONARY ARTERY BYPASS GRAFT N/A 04/26/2019   Procedure: CORONARY ARTERY BYPASS GRAFTING (CABG) x Three, using left internal mammary artery and right leg greater saphenous vein harvested endoscopically;  Surgeon: Gaye Pollack, MD;  Location: [facility];  Service: Open Heart Surgery;  Laterality: N/A;  . ELBOW BURSA SURGERY    . EYE SURGERY    . ICD IMPLANT N/A 06/25/2020   Procedure: ICD IMPLANT;  Surgeon: Vickie Epley, MD;  Location: [facility];  Service: Cardiovascular;  Laterality: N/A;  . LEFT HEART CATH AND CORS/GRAFTS ANGIOGRAPHY N/A 06/19/2020   Procedure: LEFT HEART CATH AND CORS/GRAFTS ANGIOGRAPHY;  Surgeon: Troy Sine, MD;  Location: [facility];  Service: Cardiovascular;  Laterality: N/A;  . POLYPECTOMY    . RIGHT/LEFT HEART CATH AND CORONARY ANGIOGRAPHY N/A 04/16/2019   Procedure: RIGHT/LEFT HEART CATH AND CORONARY ANGIOGRAPHY;  Surgeon: Belva Crome, MD;  Location: [facility];  Service: Cardiovascular;  Laterality: N/A;  . SHOULDER SURGERY     Rt. Shoulder  . TEE WITHOUT CARDIOVERSION N/A 04/26/2019   Procedure: TRANSESOPHAGEAL ECHOCARDIOGRAM (  TEE);  Surgeon: Gaye Pollack, MD;  Location[facility];  Service: Open Heart Surgery;  Laterality: N/A;  . TONSILLECTOMY    . V TACH ABLATION N/A 06/22/2020   Procedure: V TACH ABLATION;  Surgeon: Vickie Epley, MD;  Location: [facility];  Service: Cardiovascular;  Laterality: N/A;    Social History   Socioeconomic History  . Marital status: Married    Spouse name: Not on file  . Number of children: 1  . Years of education: 1  . Highest education level: High school graduate  Occupational History  . 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 Smoking status: Former Smoker    Quit date: 08/30/1983    Years since quitting: 36.9  . Smokeless tobacco: Former Systems developer  . Tobacco  comment: Quit 30 years ago.  Vaping Use  . Vaping Use: Never used  Substance and Sexual Activity  . Alcohol use: Yes    Comment: rare  . Drug use: No  . Sexual activity: Yes  Other Topics Concern  . Not on file  Social History Narrative  . Not on file   Social Determinants of Health   Financial Resource Strain: Not on file  Food Insecurity: Not on file  Transportation Needs: Not on file  Physical Activity: Not on file  Stress: Not on file  Social Connections: Not on file  Intimate Partner Violence: Not on file    Current Outpatient Medications on File Prior to Visit  Medication Sig Dispense Refill  . acetaminophen (TYLENOL) 500 MG tablet Take 1,000 mg by mouth at bedtime as needed for mild pain.    . Ascorbic Acid (VITAMIN C) 100 MG tablet Take 100 mg by mouth daily.    Marland Kitchen aspirin EC 81 MG tablet Take 1 tablet (81 mg total) by mouth daily. 90 tablet 3  . carvedilol (COREG) 6.25 MG tablet Take 1 tablet (6.25 mg total) by mouth 2 (two) times daily with a meal. 60 tablet 6  . citalopram (CELEXA) 10 MG tablet Take 1 tablet (10 mg total) by mouth daily. 90 tablet 2  . colestipol (COLESTID) 5 g packet Take 5 g by mouth daily.     . fexofenadine (ALLEGRA) 180 MG tablet Take 1 tablet (180 mg total) by mouth daily as needed. For allergies 90 tablet 0  . finasteride (PROSCAR) 5 MG tablet Take 1 tablet (5 mg total) by mouth daily. 90 tablet 3  . fluticasone (FLONASE) 50 MCG/ACT nasal spray SPRAY 2 SPRAYS INTO EACH NOSTRIL EVERY DAY (Patient taking differently: Place 2 sprays into both nostrils daily.) 48 mL 2  . Hydrocortisone Acetate 2.5 % CREA Apply 1 application topically in the morning and at bedtime. 30 g 1  . JARDIANCE 25 MG TABS tablet TAKE 25 MG (1 TABLET) BY MOUTH DAILY BEFORE BREAKFAST. STOP PIOGLITAZONE (Patient taking differently: Take 25 mg by mouth daily.) 90 tablet 1  . ketotifen (ZADITOR) 0.025 % ophthalmic solution Place 1 drop into both eyes daily as needed (For itching).     Marland Kitchen losartan (COZAAR) 25 MG tablet Take 1 tablet (25 mg total) by mouth daily. 90 tablet 1  . loteprednol (LOTEMAX) 0.5 % ophthalmic suspension Place 1 drop into both eyes 4 (four) times daily. (Patient taking differently: Place 1 drop into both eyes 4 (four) times daily as needed (itching).) 5 mL 0  . nystatin cream (MYCOSTATIN) Apply 1 application topically 3 (three) times daily. 60 g 0  . pantoprazole (PROTONIX) 40 MG tablet TAKE  1 TABLET BY MOUTH EVERY DAY IN THE MORNING 90 tablet 3  . rosuvastatin (CRESTOR) 40 MG tablet TAKE 1 TABLET (40 MG TOTAL) BY MOUTH DAILY AT 6 PM. 90 tablet 3  . sitaGLIPtin (JANUVIA) 50 MG tablet Take 1 tablet (50 mg total) by mouth daily. 30 tablet 5  . vitamin B-12 (CYANOCOBALAMIN) 500 MCG tablet Take 500 mcg by mouth daily.     No current facility-administered medications on file prior to visit.    Allergies  Allergen Reactions  . Doxazosin Mesylate Other (See Comments)    Drops in blood pressure   . Flomax [Tamsulosin Hcl] Itching and Other (See Comments)    Drops in blood pressure    . Penicillins Rash and Other (See Comments)    Has patient had a PCN reaction causing immediate rash, facial/tongue/throat swelling, SOB or lightheadedness with hypotension: No Has patient had a PCN reaction causing severe rash involving mucus membranes or skin necrosis: No Has patient had a PCN reaction that required hospitalization No Has patient had a PCN reaction occurring within the last 10 years: No If all of the above answers are "NO", then may proceed with Cephalosporin use.   . Sulfonamide Derivatives Rash  . Trulicity [Dulaglutide] Nausea And Vomiting    N&V, Dizziness    Family History  Problem Relation Age of Onset  . Lung cancer Father   . Allergic rhinitis Father   . Coronary artery disease Mother   . Colon cancer Neg Hx   . Heart attack Neg Hx   . Hypertension Neg Hx   . Stroke Neg Hx   . Esophageal cancer Neg Hx   . Rectal cancer Neg Hx   .  Stomach cancer Neg Hx   . Pancreatic cancer Neg Hx     Ht 5\' 11"  (1.803 m)   Wt 224 lb (101.6 kg)   BMI 31.24 kg/m     Review of Systems Denies weight loss, visual loss, and depression.      Objective:   Physical Exam VITAL SIGNS:  See vs page GENERAL: no distress SPINE: no kyphosis GAIT: normal and steady.    25-OH vit-D=31   I have reviewed outside records, and summarized: Pt was noted to have elevated Ca++, and referred here.  Gout was main prob addressed. ZAAA>>>>>>>>>>>>>>>>>>>>< [id number]     Assessment & Plan:  Vit-D def: well-controlled.  We can hold off on resuming it for now Hypercalcemia: check labs for this today  Patient Instructions  Blood tests are requested for you today.  We'll let you know about the results.  If the vitamin-D is low again, we'll resume it. please come back for a follow-up appointment in 1 month.

## 2020-08-06 NOTE — Patient Instructions (Signed)
Blood tests are requested for you today.  We'll let you know about the results.  If the vitamin-D is low again, we'll resume it. please come back for a follow-up appointment in 1 month.

## 2020-08-10 ENCOUNTER — Telehealth: Payer: Self-pay

## 2020-08-10 NOTE — Telephone Encounter (Signed)
Left message as indicated

## 2020-08-10 NOTE — Telephone Encounter (Signed)
Patient has made appointment to have his PSA tested on 08-11-20

## 2020-08-10 NOTE — Telephone Encounter (Signed)
Pt would like to know if he can come in to be tested . Wants to come in first thing in the AM .. Please call

## 2020-08-10 NOTE — Telephone Encounter (Signed)
[phone] or [phone], can call and leave a msg as to when he had his last PSA test.

## 2020-08-11 ENCOUNTER — Other Ambulatory Visit: Payer: Self-pay

## 2020-08-11 ENCOUNTER — Other Ambulatory Visit: Payer: Medicare HMO

## 2020-08-11 DIAGNOSIS — Z125 Encounter for screening for malignant neoplasm of prostate: Secondary | ICD-10-CM | POA: Diagnosis not present

## 2020-08-11 DIAGNOSIS — R300 Dysuria: Secondary | ICD-10-CM | POA: Diagnosis not present

## 2020-08-11 LAB — URINALYSIS, ROUTINE W REFLEX MICROSCOPIC
Bilirubin Urine: NEGATIVE
Hgb urine dipstick: NEGATIVE
Ketones, ur: NEGATIVE
Leukocytes,Ua: NEGATIVE
Nitrite: NEGATIVE
Protein, ur: NEGATIVE
Specific Gravity, Urine: 1.01 (ref 1.001–1.03)
pH: 5 (ref 5.0–8.0)

## 2020-08-11 LAB — PSA: PSA: 0.39 ng/mL (ref <=4.0)

## 2020-08-12 LAB — URINE CULTURE
MICRO NUMBER:: 11315242
Result:: NO GROWTH
SPECIMEN QUALITY:: ADEQUATE

## 2020-08-14 ENCOUNTER — Telehealth: Payer: Self-pay

## 2020-08-14 LAB — PTH-RELATED PEPTIDE: PTH-Related Protein (PTH-RP): 10 pg/mL — ABNORMAL LOW (ref 11–20)

## 2020-08-14 LAB — PTH, INTACT AND CALCIUM
Calcium: 10.9 mg/dL — ABNORMAL HIGH (ref 8.6–10.3)
PTH: 55 pg/mL (ref 14–64)

## 2020-08-14 LAB — VITAMIN D 1,25 DIHYDROXY
Vitamin D 1, 25 (OH)2 Total: 46 pg/mL (ref 18–72)
Vitamin D2 1, 25 (OH)2: <8 pg/mL
Vitamin D3 1, 25 (OH)2: 46 pg/mL

## 2020-08-14 LAB — VITAMIN A: Vitamin A (Retinoic Acid): 48 ug/dL (ref 38–98)

## 2020-08-14 NOTE — Telephone Encounter (Signed)
Pt is aware of psa results.

## 2020-08-15 ENCOUNTER — Other Ambulatory Visit: Payer: Self-pay | Admitting: Endocrinology

## 2020-08-27 ENCOUNTER — Encounter: Payer: Self-pay | Admitting: Family Medicine

## 2020-08-27 ENCOUNTER — Ambulatory Visit (INDEPENDENT_AMBULATORY_CARE_PROVIDER_SITE_OTHER): Payer: Medicare HMO | Admitting: Family Medicine

## 2020-08-27 ENCOUNTER — Other Ambulatory Visit: Payer: Self-pay

## 2020-08-27 VITALS — BP 130/78 | HR 72 | Temp 98.2°F | Resp 14 | Ht 71.0 in | Wt 222.0 lb

## 2020-08-27 DIAGNOSIS — R42 Dizziness and giddiness: Secondary | ICD-10-CM

## 2020-08-27 MED ORDER — LOTEPREDNOL ETABONATE 0.5 % OP SUSP
1.0000 [drp] | Freq: Four times a day (QID) | OPHTHALMIC | 1 refills | Status: DC | PRN
Start: 2020-08-27 — End: 2022-11-23

## 2020-08-27 NOTE — Progress Notes (Signed)
Subjective:    Patient ID: Cameron Daniel, male    DOB: Sep 25, 1945, 74 y.o.   MRN: [id number]  HPI  Patient recently was admitted with ventricular tachycardia and underwent an ICD implantation.  His carvedilol was also increased to 6.25 mg twice daily.  He was also started on losartan due to congestive heart failure with an ejection fraction of 45% and hypokinesis.  Since these medications were started, the patient reports dizziness.  He reports orthostatic dizziness and lightheadedness.  He denies any vertigo.  He denies any syncope.  He denies any palpitations or tachyarrhythmias or syncope or near syncope.  He denies any electric shocks from his ICD. Past Medical History:  Diagnosis Date  . Allergic rhinitis, cause unspecified   . Allergy   . Anxiety   . Arthritis   . Cancer ([place])   . Carotid Doppler 04/2020   Carotid US 9/21: No evidence of ICA stenosis bilaterally; right vertebral artery stenosis  . Cataract   . Chronic systolic CHF (congestive heart failure) ([place])   . Coronary atherosclerosis of unspecified type of vessel, native or graft    a. MI 1985 with unclear details. b. CABG 03/2019 with LIMA -> LAD, SVG -> OM, SVG to dRCA.  . Depression    PTSD   . Diverticulosis of colon (without mention of hemorrhage)   . Esophageal reflux   . Esophageal stricture   . Essential hypertension   . Former tobacco use   . Hemorrhoids   . Hyperlipidemia   . Irritable bowel syndrome   . Ischemic cardiomyopathy   . Mild carotid artery disease (HCC)    a. 1-39% bilaterally 03/2019 duplex.  . Myocardial infarction ([place])   . Nocturia   . Obesity, unspecified   . Orthostasis   . Other acquired absence of organ   . Personal history of colonic polyps   . Psychosexual dysfunction with inhibited sexual excitement   . Retroperitoneal bleed   . Type II or unspecified type diabetes mellitus without mention of complication, not stated as uncontrolled    Past Surgical History:  Procedure  Laterality Date  . ADENOIDECTOMY    . CARPAL TUNNEL RELEASE     left   . CHOLECYSTECTOMY    . COLON RESECTION     18 inches  . COLON SURGERY  2013  . CORONARY ANGIOPLASTY     1985   . CORONARY ARTERY BYPASS GRAFT N/A 04/26/2019   Procedure: CORONARY ARTERY BYPASS GRAFTING (CABG) x Three, using left internal mammary artery and right leg greater saphenous vein harvested endoscopically;  Surgeon: Gaye Pollack, MD;  Location: [facility];  Service: Open Heart Surgery;  Laterality: N/A;  . ELBOW BURSA SURGERY    . EYE SURGERY    . ICD IMPLANT N/A 06/25/2020   Procedure: ICD IMPLANT;  Surgeon: Vickie Epley, MD;  Location: [facility];  Service: Cardiovascular;  Laterality: N/A;  . LEFT HEART CATH AND CORS/GRAFTS ANGIOGRAPHY N/A 06/19/2020   Procedure: LEFT HEART CATH AND CORS/GRAFTS ANGIOGRAPHY;  Surgeon: Troy Sine, MD;  Location: [facility];  Service: Cardiovascular;  Laterality: N/A;  . POLYPECTOMY    . RIGHT/LEFT HEART CATH AND CORONARY ANGIOGRAPHY N/A 04/16/2019   Procedure: RIGHT/LEFT HEART CATH AND CORONARY ANGIOGRAPHY;  Surgeon: Belva Crome, MD;  Location: [facility];  Service: Cardiovascular;  Laterality: N/A;  . SHOULDER SURGERY     Rt. Shoulder  . TEE WITHOUT CARDIOVERSION N/A 04/26/2019  Procedure: TRANSESOPHAGEAL ECHOCARDIOGRAM (TEE);  Surgeon: Gaye Pollack, MD;  Location: [facility];  Service: Open Heart Surgery;  Laterality: N/A;  . TONSILLECTOMY    . V TACH ABLATION N/A 06/22/2020   Procedure: V TACH ABLATION;  Surgeon: Vickie Epley, MD;  Location: [facility];  Service: Cardiovascular;  Laterality: N/A;   Current Outpatient Medications on File Prior to Visit  Medication Sig Dispense Refill  . acetaminophen (TYLENOL) 500 MG tablet Take 1,000 mg by mouth at bedtime as needed for mild pain.    Marland Kitchen aspirin EC 81 MG tablet Take 1 tablet (81 mg total) by mouth daily. 90 tablet 3  . carvedilol (COREG) 6.25 MG tablet Take 1 tablet (6.25 mg  total) by mouth 2 (two) times daily with a meal. (Patient taking differently: Take 3.125 mg by mouth 2 (two) times daily with a meal.) 60 tablet 6  . colestipol (COLESTID) 5 g packet Take 5 g by mouth daily.     . fexofenadine (ALLEGRA) 180 MG tablet Take 1 tablet (180 mg total) by mouth daily as needed. For allergies 90 tablet 0  . finasteride (PROSCAR) 5 MG tablet Take 1 tablet (5 mg total) by mouth daily. 90 tablet 3  . fluticasone (FLONASE) 50 MCG/ACT nasal spray SPRAY 2 SPRAYS INTO EACH NOSTRIL EVERY DAY (Patient taking differently: Place 2 sprays into both nostrils daily.) 48 mL 2  . Hydrocortisone Acetate 2.5 % CREA Apply 1 application topically in the morning and at bedtime. 30 g 1  . JARDIANCE 25 MG TABS tablet TAKE 25 MG (1 TABLET) BY MOUTH DAILY BEFORE BREAKFAST. STOP PIOGLITAZONE (Patient taking differently: Take 25 mg by mouth daily.) 90 tablet 1  . ketotifen (ZADITOR) 0.025 % ophthalmic solution Place 1 drop into both eyes daily as needed (For itching).    Marland Kitchen losartan (COZAAR) 25 MG tablet Take 1 tablet (25 mg total) by mouth daily. 90 tablet 1  . nystatin cream (MYCOSTATIN) Apply 1 application topically 3 (three) times daily. 60 g 0  . pantoprazole (PROTONIX) 40 MG tablet TAKE 1 TABLET BY MOUTH EVERY DAY IN THE MORNING 90 tablet 3  . rosuvastatin (CRESTOR) 40 MG tablet TAKE 1 TABLET (40 MG TOTAL) BY MOUTH DAILY AT 6 PM. 90 tablet 3  . sitaGLIPtin (JANUVIA) 50 MG tablet Take 1 tablet (50 mg total) by mouth daily. 30 tablet 5  . Ascorbic Acid (VITAMIN C) 100 MG tablet Take 100 mg by mouth daily. (Patient not taking: Reported on 08/27/2020)    . citalopram (CELEXA) 10 MG tablet Take 1 tablet (10 mg total) by mouth daily. (Patient not taking: Reported on 08/27/2020) 90 tablet 2  . vitamin B-12 (CYANOCOBALAMIN) 500 MCG tablet Take 500 mcg by mouth daily. (Patient not taking: Reported on 08/27/2020)     No current facility-administered medications on file prior to visit.   Allergies   Allergen Reactions  . Doxazosin Mesylate Other (See Comments)    Drops in blood pressure   . Flomax [Tamsulosin Hcl] Itching and Other (See Comments)    Drops in blood pressure    . Penicillins Rash and Other (See Comments)    Has patient had a PCN reaction causing immediate rash, facial/tongue/throat swelling, SOB or lightheadedness with hypotension: No Has patient had a PCN reaction causing severe rash involving mucus membranes or skin necrosis: No Has patient had a PCN reaction that required hospitalization No Has patient had a PCN reaction occurring within the last 10 years: No If all  of the above answers are "NO", then may proceed with Cephalosporin use.   . Sulfonamide Derivatives Rash  . Trulicity [Dulaglutide] Nausea And Vomiting    N&V, Dizziness   Social History   Socioeconomic History  . Marital status: Married    Spouse name: Not on file  . Number of children: 1  . Years of education: 73  . Highest education level: High school graduate  Occupational History  . 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 Smoking status: Former Smoker    Quit date: 08/30/1983    Years since quitting: 37.0  . Smokeless tobacco: Former Neurosurgeon  . Tobacco comment: Quit 30 years ago.  Vaping Use  . Vaping Use: Never used  Substance and Sexual Activity  . Alcohol use: Yes    Comment: rare  . Drug use: No  . Sexual activity: Yes  Other Topics Concern  . Not on file  Social History Narrative  . Not on file   Social Determinants of Health   Financial Resource Strain: Not on file  Food Insecurity: Not on file  Transportation Needs: Not on file  Physical Activity: Not on file  Stress: Not on file  Social Connections: Not on file  Intimate Partner Violence: Not on file     Review of Systems     Objective:   Physical Exam Vitals reviewed.  Constitutional:      Appearance: Normal appearance.  Cardiovascular:     Rate and Rhythm: Normal rate and regular rhythm.     Pulses: Normal  pulses.     Heart sounds: Normal heart sounds. No murmur heard.   Pulmonary:     Effort: Pulmonary effort is normal. No respiratory distress.     Breath sounds: Normal breath sounds. No wheezing, rhonchi or rales.  Musculoskeletal:     Right lower leg: No edema.     Left lower leg: No edema.  Neurological:     Mental Status: He is alert.           Assessment & Plan:  Orthostatic dizziness  I suspect the patient is having some mild orthostatic dizziness due to the combination of carvedilol, losartan, and Jardiance.  However explained to him the importance in these medications and trying to help improve and control his congestive heart failure.  The patient would like to try to reduce his carvedilol to 3.125 mg twice daily due to the severity of the orthostatic dizziness and see if his symptoms improve.  He will let me know in 2 weeks if the symptoms have improved.

## 2020-08-31 ENCOUNTER — Other Ambulatory Visit: Payer: Self-pay

## 2020-08-31 ENCOUNTER — Other Ambulatory Visit (INDEPENDENT_AMBULATORY_CARE_PROVIDER_SITE_OTHER): Payer: Medicare HMO

## 2020-09-01 LAB — CALCIUM, URINE, 24 HOUR: Calcium, 24H Urine: 179 mg/24 h

## 2020-09-02 ENCOUNTER — Telehealth: Payer: Self-pay | Admitting: Endocrinology

## 2020-09-02 DIAGNOSIS — E119 Type 2 diabetes mellitus without complications: Secondary | ICD-10-CM | POA: Diagnosis not present

## 2020-09-02 NOTE — Telephone Encounter (Signed)
Patient returned call (he thinks re: lab results). Patient requests to be called.

## 2020-09-02 NOTE — Telephone Encounter (Signed)
Notified pt wife regarding lab results

## 2020-09-07 ENCOUNTER — Other Ambulatory Visit: Payer: Self-pay

## 2020-09-07 ENCOUNTER — Encounter: Payer: Self-pay | Admitting: Endocrinology

## 2020-09-07 ENCOUNTER — Ambulatory Visit (INDEPENDENT_AMBULATORY_CARE_PROVIDER_SITE_OTHER): Payer: Medicare HMO | Admitting: Endocrinology

## 2020-09-07 NOTE — Progress Notes (Signed)
Subjective:    Patient ID: Cameron Daniel, male    DOB: 1945/09/15, 75 y.o.   MRN: [id number]  HPI  Pt returns for f/u of hypercalcemia (he was dx'ed with Vit-D def in 2012, and hypercalcemia in 2021; he has never had osteoporosis or bony fracture; He had partial colectomy in 2013, for diverticulitis; he has not recently required Vit-D supplement; 24HR urine Ca++ was 179 mg; other w/u was neg; he has h/o urolithiasis).  pt states he feels well in general.   Past Medical History:  Diagnosis Date  . Allergic rhinitis, cause unspecified   . Allergy   . Anxiety   . Arthritis   . Cancer ([place])   . Carotid Doppler 04/2020   Carotid US 9/21: No evidence of ICA stenosis bilaterally; right vertebral artery stenosis  . Cataract   . Chronic systolic CHF (congestive heart failure) ([place])   . Coronary atherosclerosis of unspecified type of vessel, native or graft    a. MI 1985 with unclear details. b. CABG 03/2019 with LIMA -> LAD, SVG -> OM, SVG to dRCA.  . Depression    PTSD   . Diverticulosis of colon (without mention of hemorrhage)   . Esophageal reflux   . Esophageal stricture   . Essential hypertension   . Former tobacco use   . Hemorrhoids   . Hyperlipidemia   . Irritable bowel syndrome   . Ischemic cardiomyopathy   . Mild carotid artery disease (HCC)    a. 1-39% bilaterally 03/2019 duplex.  . Myocardial infarction ([place])   . Nocturia   . Obesity, unspecified   . Orthostasis   . Other acquired absence of organ   . Personal history of colonic polyps   . Psychosexual dysfunction with inhibited sexual excitement   . Retroperitoneal bleed   . Type II or unspecified type diabetes mellitus without mention of complication, not stated as uncontrolled     Past Surgical History:  Procedure Laterality Date  . ADENOIDECTOMY    . CARPAL TUNNEL RELEASE     left   . CHOLECYSTECTOMY    . COLON RESECTION     18 inches  . COLON SURGERY  2013  . CORONARY ANGIOPLASTY     1985   .  CORONARY ARTERY BYPASS GRAFT N/A 04/26/2019   Procedure: CORONARY ARTERY BYPASS GRAFTING (CABG) x Three, using left internal mammary artery and right leg greater saphenous vein harvested endoscopically;  Surgeon: Gaye Pollack, MD;  Location: [facility];  Service: Open Heart Surgery;  Laterality: N/A;  . ELBOW BURSA SURGERY    . EYE SURGERY    . ICD IMPLANT N/A 06/25/2020   Procedure: ICD IMPLANT;  Surgeon: Vickie Epley, MD;  Location: [facility];  Service: Cardiovascular;  Laterality: N/A;  . LEFT HEART CATH AND CORS/GRAFTS ANGIOGRAPHY N/A 06/19/2020   Procedure: LEFT HEART CATH AND CORS/GRAFTS ANGIOGRAPHY;  Surgeon: Troy Sine, MD;  Location: [facility];  Service: Cardiovascular;  Laterality: N/A;  . POLYPECTOMY    . RIGHT/LEFT HEART CATH AND CORONARY ANGIOGRAPHY N/A 04/16/2019   Procedure: RIGHT/LEFT HEART CATH AND CORONARY ANGIOGRAPHY;  Surgeon: Belva Crome, MD;  Location: [facility];  Service: Cardiovascular;  Laterality: N/A;  . SHOULDER SURGERY     Rt. Shoulder  . TEE WITHOUT CARDIOVERSION N/A 04/26/2019   Procedure: TRANSESOPHAGEAL ECHOCARDIOGRAM (TEE);  Surgeon: Gaye Pollack, MD;  Location: [facility];  Service: Open Heart Surgery;  Laterality: N/A;  . TONSILLECTOMY    .  V TACH ABLATION N/A 06/22/2020   Procedure: V TACH ABLATION;  Surgeon: Vickie Epley, MD;  Location: [facility];  Service: Cardiovascular;  Laterality: N/A;    Social History   Socioeconomic History  . Marital status: Married    Spouse name: Not on file  . Number of children: 1  . Years of education: 58  . Highest education level: High school graduate  Occupational History  . 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 Smoking status: Former Smoker    Quit date: 08/30/1983    Years since quitting: 37.0  . Smokeless tobacco: Former Systems developer  . Tobacco comment: Quit 30 years ago.  Vaping Use  . Vaping Use: Never used  Substance and Sexual Activity  . Alcohol use: Yes     Comment: rare  . Drug use: No  . Sexual activity: Yes  Other Topics Concern  . Not on file  Social History Narrative  . Not on file   Social Determinants of Health   Financial Resource Strain: Not on file  Food Insecurity: Not on file  Transportation Needs: Not on file  Physical Activity: Not on file  Stress: Not on file  Social Connections: Not on file  Intimate Partner Violence: Not on file    Current Outpatient Medications on File Prior to Visit  Medication Sig Dispense Refill  . acetaminophen (TYLENOL) 500 MG tablet Take 1,000 mg by mouth at bedtime as needed for mild pain.    . Ascorbic Acid (VITAMIN C) 100 MG tablet Take 100 mg by mouth daily.    Marland Kitchen aspirin EC 81 MG tablet Take 1 tablet (81 mg total) by mouth daily. 90 tablet 3  . carvedilol (COREG) 6.25 MG tablet Take 1 tablet (6.25 mg total) by mouth 2 (two) times daily with a meal. (Patient taking differently: Take 3.125 mg by mouth 2 (two) times daily with a meal.) 60 tablet 6  . citalopram (CELEXA) 10 MG tablet Take 1 tablet (10 mg total) by mouth daily. 90 tablet 2  . colestipol (COLESTID) 5 g packet Take 5 g by mouth daily.     . fexofenadine (ALLEGRA) 180 MG tablet Take 1 tablet (180 mg total) by mouth daily as needed. For allergies 90 tablet 0  . finasteride (PROSCAR) 5 MG tablet Take 1 tablet (5 mg total) by mouth daily. 90 tablet 3  . fluticasone (FLONASE) 50 MCG/ACT nasal spray SPRAY 2 SPRAYS INTO EACH NOSTRIL EVERY DAY (Patient taking differently: Place 2 sprays into both nostrils daily.) 48 mL 2  . Hydrocortisone Acetate 2.5 % CREA Apply 1 application topically in the morning and at bedtime. 30 g 1  . JARDIANCE 25 MG TABS tablet TAKE 25 MG (1 TABLET) BY MOUTH DAILY BEFORE BREAKFAST. STOP PIOGLITAZONE (Patient taking differently: Take 25 mg by mouth daily.) 90 tablet 1  . ketotifen (ZADITOR) 0.025 % ophthalmic solution Place 1 drop into both eyes daily as needed (For itching).    Marland Kitchen losartan (COZAAR) 25 MG tablet  Take 1 tablet (25 mg total) by mouth daily. 90 tablet 1  . loteprednol (LOTEMAX) 0.5 % ophthalmic suspension Place 1 drop into both eyes 4 (four) times daily as needed (itching). 5 mL 1  . nystatin cream (MYCOSTATIN) Apply 1 application topically 3 (three) times daily. 60 g 0  . pantoprazole (PROTONIX) 40 MG tablet TAKE 1 TABLET BY MOUTH EVERY DAY IN THE MORNING 90 tablet 3  . rosuvastatin (CRESTOR) 40 MG tablet TAKE 1 TABLET (40 MG  TOTAL) BY MOUTH DAILY AT 6 PM. 90 tablet 3  . sitaGLIPtin (JANUVIA) 50 MG tablet Take 1 tablet (50 mg total) by mouth daily. 30 tablet 5  . vitamin B-12 (CYANOCOBALAMIN) 500 MCG tablet Take 500 mcg by mouth daily.     No current facility-administered medications on file prior to visit.    Family History  Problem Relation Age of Onset  . Lung cancer Father   . Allergic rhinitis Father   . Coronary artery disease Mother   . Colon cancer Neg Hx   . Heart attack Neg Hx   . Hypertension Neg Hx   . Stroke Neg Hx   . Esophageal cancer Neg Hx   . Rectal cancer Neg Hx   . Stomach cancer Neg Hx   . Pancreatic cancer Neg Hx     BP 138/80   Pulse 76   Wt 225 lb (102.1 kg)   SpO2 95%   BMI 31.38 kg/m    Review of Systems     Objective:   Physical Exam VITAL SIGNS:  See vs page GENERAL: no distress Ext: trace bilat leg edema.   GAIT: normal and steady  Lab Results  Component Value Date   PTH 55 08/06/2020   CALCIUM 10.9 (H) 08/06/2020   CAION 1.30 04/27/2019       Assessment & Plan:  Hypercalcemia, persistent, uncertain etiology and prognosis.  HTN: well-controlled.   Patient Instructions  No treatment is needed now, for the high blood calcium.   Please come back for a follow-up appointment in 6 months.

## 2020-09-07 NOTE — Patient Instructions (Signed)
No treatment is needed now, for the high blood calcium.   Please come back for a follow-up appointment in 6 months.

## 2020-09-08 ENCOUNTER — Ambulatory Visit: Payer: Medicare HMO | Admitting: Endocrinology

## 2020-09-09 DIAGNOSIS — H04123 Dry eye syndrome of bilateral lacrimal glands: Secondary | ICD-10-CM | POA: Diagnosis not present

## 2020-09-09 DIAGNOSIS — H10812 Pingueculitis, left eye: Secondary | ICD-10-CM | POA: Diagnosis not present

## 2020-09-11 LAB — HM DIABETES EYE EXAM

## 2020-09-21 ENCOUNTER — Encounter: Payer: Self-pay | Admitting: Family Medicine

## 2020-09-24 ENCOUNTER — Ambulatory Visit (INDEPENDENT_AMBULATORY_CARE_PROVIDER_SITE_OTHER): Payer: Medicare HMO

## 2020-09-24 DIAGNOSIS — I472 Ventricular tachycardia, unspecified: Secondary | ICD-10-CM

## 2020-09-24 LAB — CUP PACEART REMOTE DEVICE CHECK
Battery Remaining Longevity: 136 mo
Battery Voltage: 3.16 V
Brady Statistic RV Percent Paced: 0.01 %
Date Time Interrogation Session: 20220127022826
HighPow Impedance: 71 Ohm
Implantable Lead Implant Date: 20211028
Implantable Lead Location: 753860
Implantable Pulse Generator Implant Date: 20211028
Lead Channel Impedance Value: 437 Ohm
Lead Channel Impedance Value: 494 Ohm
Lead Channel Pacing Threshold Amplitude: 0.5 V
Lead Channel Pacing Threshold Pulse Width: 0.4 ms
Lead Channel Sensing Intrinsic Amplitude: 21.5 mV
Lead Channel Sensing Intrinsic Amplitude: 21.5 mV
Lead Channel Setting Pacing Amplitude: 2 V
Lead Channel Setting Pacing Pulse Width: 0.4 ms
Lead Channel Setting Sensing Sensitivity: 0.3 mV

## 2020-09-28 ENCOUNTER — Ambulatory Visit: Payer: Medicare HMO | Admitting: Cardiology

## 2020-09-28 ENCOUNTER — Other Ambulatory Visit: Payer: Self-pay

## 2020-09-28 ENCOUNTER — Encounter: Payer: Self-pay | Admitting: Cardiology

## 2020-09-28 VITALS — BP 130/68 | HR 76 | Ht 71.0 in | Wt 225.0 lb

## 2020-09-28 DIAGNOSIS — I472 Ventricular tachycardia, unspecified: Secondary | ICD-10-CM

## 2020-09-28 DIAGNOSIS — E1169 Type 2 diabetes mellitus with other specified complication: Secondary | ICD-10-CM | POA: Diagnosis not present

## 2020-09-28 DIAGNOSIS — I5022 Chronic systolic (congestive) heart failure: Secondary | ICD-10-CM | POA: Diagnosis not present

## 2020-09-28 DIAGNOSIS — Z951 Presence of aortocoronary bypass graft: Secondary | ICD-10-CM | POA: Diagnosis not present

## 2020-09-28 DIAGNOSIS — E785 Hyperlipidemia, unspecified: Secondary | ICD-10-CM

## 2020-09-28 NOTE — Progress Notes (Signed)
Electrophysiology Office Follow up Visit Note:    Date:  09/28/2020   ID:  Cameron Daniel, Cameron Daniel 1946/05/07, MRN [id number]  PCP:  [name], MD  [facility] Cardiologist:  [name], MD  [facility] Electrophysiologist:  Vickie Epley, MD    Interval History:    Cameron Daniel is a 75 y.o. male who presents for a follow up visit after he underwent a VT ablation on June 22, 2020 and a ICD implant on June 25, 2020.  During the procedure, I modified a large scar on the inferior and inferolateral walls of the left ventricle.  He did well with the VT ablation and then underwent a successful ICD implant on October 28.  Since the implant of his ICD and VT ablation, he has not had recurrence of sustained VT.  He is not received any therapies from his ICD.  He did have 3 episodes of nonsustained VT that were asymptomatic and self terminating.  He is tolerating his medical therapy well.  He did have his carvedilol down titrated by his primary care physician to 3.125 mg twice daily from his previous dose of 6.25 mg twice daily.     Past Medical History:  Diagnosis Date  . Allergic rhinitis, cause unspecified   . Allergy   . Anxiety   . Arthritis   . Cancer ([place])   . Carotid Doppler 04/2020   Carotid US 9/21: No evidence of ICA stenosis bilaterally; right vertebral artery stenosis  . Cataract   . Chronic systolic CHF (congestive heart failure) ([place])   . Coronary atherosclerosis of unspecified type of vessel, native or graft    a. MI 1985 with unclear details. b. CABG 03/2019 with LIMA -> LAD, SVG -> OM, SVG to dRCA.  . Depression    PTSD   . Diverticulosis of colon (without mention of hemorrhage)   . Esophageal reflux   . Esophageal stricture   . Essential hypertension   . Former tobacco use   . Hemorrhoids   . Hyperlipidemia   . Irritable bowel syndrome   . Ischemic cardiomyopathy   . Mild carotid artery disease (HCC)    a. 1-39% bilaterally 03/2019  duplex.  . Myocardial infarction ([place])   . Nocturia   . Obesity, unspecified   . Orthostasis   . Other acquired absence of organ   . Personal history of colonic polyps   . Psychosexual dysfunction with inhibited sexual excitement   . Retroperitoneal bleed   . Type II or unspecified type diabetes mellitus without mention of complication, not stated as uncontrolled     Past Surgical History:  Procedure Laterality Date  . ADENOIDECTOMY    . CARPAL TUNNEL RELEASE     left   . CHOLECYSTECTOMY    . COLON RESECTION     18 inches  . COLON SURGERY  2013  . CORONARY ANGIOPLASTY     1985   . CORONARY ARTERY BYPASS GRAFT N/A 04/26/2019   Procedure: CORONARY ARTERY BYPASS GRAFTING (CABG) x Three, using left internal mammary artery and right leg greater saphenous vein harvested endoscopically;  Surgeon: Gaye Pollack, MD;  Location: [place] OR;  Service: Open Heart Surgery;  Laterality: N/A;  . ELBOW BURSA SURGERY    . EYE SURGERY    . ICD IMPLANT N/A 06/25/2020   Procedure: ICD IMPLANT;  Surgeon: Vickie Epley, MD;  Location: [place] [facility];  Service: Cardiovascular;  Laterality: N/A;  . LEFT HEART CATH AND  CORS/GRAFTS ANGIOGRAPHY N/A 06/19/2020   Procedure: LEFT HEART CATH AND CORS/GRAFTS ANGIOGRAPHY;  Surgeon: Troy Sine, MD;  Location: [place] [facility];  Service: Cardiovascular;  Laterality: N/A;  . POLYPECTOMY    . RIGHT/LEFT HEART CATH AND CORONARY ANGIOGRAPHY N/A 04/16/2019   Procedure: RIGHT/LEFT HEART CATH AND CORONARY ANGIOGRAPHY;  Surgeon: Belva Crome, MD;  Location: [place] [facility];  Service: Cardiovascular;  Laterality: N/A;  . SHOULDER SURGERY     Rt. Shoulder  . TEE WITHOUT CARDIOVERSION N/A 04/26/2019   Procedure: TRANSESOPHAGEAL ECHOCARDIOGRAM (TEE);  Surgeon: Gaye Pollack, MD;  Location: [place];  Service: Open Heart Surgery;  Laterality: N/A;  . TONSILLECTOMY    . V TACH ABLATION N/A 06/22/2020   Procedure: V TACH ABLATION;  Surgeon: Vickie Epley,  MD;  Location: [place] [facility];  Service: Cardiovascular;  Laterality: N/A;    Current Medications: Current Meds  Medication Sig  . acetaminophen (TYLENOL) 500 MG tablet Take 1,000 mg by mouth at bedtime as needed for mild pain.  . Ascorbic Acid (VITAMIN C) 100 MG tablet Take 100 mg by mouth daily.  Marland Kitchen aspirin EC 81 MG tablet Take 1 tablet (81 mg total) by mouth daily.  . carvedilol (COREG) 3.125 MG tablet Take 3.125 mg by mouth 2 (two) times daily with a meal.  . citalopram (CELEXA) 10 MG tablet Take 1 tablet (10 mg total) by mouth daily.  . colestipol (COLESTID) 5 g packet Take 5 g by mouth daily.   . fexofenadine (ALLEGRA) 180 MG tablet Take 1 tablet (180 mg total) by mouth daily as needed. For allergies  . finasteride (PROSCAR) 5 MG tablet Take 1 tablet (5 mg total) by mouth daily.  . fluticasone (FLONASE) 50 MCG/ACT nasal spray SPRAY 2 SPRAYS INTO EACH NOSTRIL EVERY DAY (Patient taking differently: Place 2 sprays into both nostrils daily.)  . Hydrocortisone Acetate 2.5 % CREA Apply 1 application topically in the morning and at bedtime.  Marland Kitchen JARDIANCE 25 MG TABS tablet TAKE 25 MG (1 TABLET) BY MOUTH DAILY BEFORE BREAKFAST. STOP PIOGLITAZONE (Patient taking differently: Take 25 mg by mouth daily.)  . ketotifen (ZADITOR) 0.025 % ophthalmic solution Place 1 drop into both eyes daily as needed (For itching).  Marland Kitchen losartan (COZAAR) 25 MG tablet Take 1 tablet (25 mg total) by mouth daily.  Marland Kitchen loteprednol (LOTEMAX) 0.5 % ophthalmic suspension Place 1 drop into both eyes 4 (four) times daily as needed (itching).  . nystatin cream (MYCOSTATIN) Apply 1 application topically 3 (three) times daily.  . pantoprazole (PROTONIX) 40 MG tablet TAKE 1 TABLET BY MOUTH EVERY DAY IN THE MORNING  . rosuvastatin (CRESTOR) 40 MG tablet TAKE 1 TABLET (40 MG TOTAL) BY MOUTH DAILY AT 6 PM.  . sitaGLIPtin (JANUVIA) 50 MG tablet Take 1 tablet (50 mg total) by mouth daily.  . vitamin B-12 (CYANOCOBALAMIN) 500 MCG tablet  Take 500 mcg by mouth daily.  . [DISCONTINUED] carvedilol (COREG) 6.25 MG tablet Take 1 tablet (6.25 mg total) by mouth 2 (two) times daily with a meal. (Patient taking differently: Take 3.125 mg by mouth 2 (two) times daily with a meal.)     Allergies:   Doxazosin mesylate, Flomax [tamsulosin hcl], Penicillins, Sulfonamide derivatives, and Trulicity [dulaglutide]   Social History   Socioeconomic History  . Marital status: Married    Spouse name: Not on file  . Number of children: 1  . Years of education: 18  . Highest education level: High school graduate  Occupational History  . 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 Smoking status: Former Smoker    Quit date: 08/30/1983    Years since quitting: 37.1  . Smokeless tobacco: Former Systems developer  . Tobacco comment: Quit 30 years ago.  Vaping Use  . Vaping Use: Never used  Substance and Sexual Activity  . Alcohol use: Yes    Comment: rare  . Drug use: No  . Sexual activity: Yes  Other Topics Concern  . Not on file  Social History Narrative  . Not on file   Social Determinants of Health   Financial Resource Strain: Not on file  Food Insecurity: Not on file  Transportation Needs: Not on file  Physical Activity: Not on file  Stress: Not on file  Social Connections: Not on file     Family History: The patient's family history includes Allergic rhinitis in his father; Coronary artery disease in his mother; Lung cancer in his father. There is no history of Colon cancer, Heart attack, Hypertension, Stroke, Esophageal cancer, Rectal cancer, Stomach cancer, or Pancreatic cancer.  ROS:   Please see the history of present illness.    All other systems reviewed and are negative.  EKGs/Labs/Other Studies Reviewed:    The following studies were reviewed today:  September 28, 2020 device interrogation personally reviewed RV capture threshold 0.5 V at 0.4 ms, pacing impedance 513 ohms, R waves greater than 20 mV VVI 40 3 episodes of  nonsustained VT.  Up to 8 beats Patient activity averaging 3 hours/day Less than 0.1% V pacing    Recent Labs: 03/11/2020: ALT 35 06/20/2020: Hemoglobin 14.6; Magnesium 1.8; Platelets 122 07/17/2020: BUN 17; Creat 0.83; Potassium 3.8; Sodium 137  Recent Lipid Panel    Component Value Date/Time   CHOL 109 03/11/2020 0804   TRIG 269 (H) 03/11/2020 0804   TRIG 193 (H) 08/08/2006 0924   HDL 49 03/11/2020 0804   CHOLHDL 2.2 03/11/2020 0804   VLDL 40 (H) 12/15/2016 0815   LDLCALC 28 03/11/2020 0804   LDLDIRECT 49.9 08/26/2013 1011    Physical Exam:    VS:  BP 130/68   Pulse 76   Ht 5\' 11"  (1.803 m)   Wt 225 lb (102.1 kg)   SpO2 97%   BMI 31.38 kg/m     Wt Readings from Last 3 Encounters:  09/28/20 225 lb (102.1 kg)  09/07/20 225 lb (102.1 kg)  08/27/20 222 lb (100.7 kg)     GEN:  Well nourished, well developed in no acute distress HEENT: Normal NECK: No JVD; No carotid bruits LYMPHATICS: No lymphadenopathy CARDIAC: RRR, no murmurs, rubs, gallops.  Defibrillator pocket well-healed. RESPIRATORY:  Clear to auscultation without rales, wheezing or rhonchi  ABDOMEN: Soft, non-tender, non-distended MUSCULOSKELETAL:  No edema; No deformity  SKIN: Warm and dry NEUROLOGIC:  Alert and oriented x 3 PSYCHIATRIC:  Normal affect   ASSESSMENT:    1. Ventricular tachycardia ([place])   2. Chronic systolic heart failure ([place])   3. S/P CABG (coronary artery bypass graft)   4. Type 2 diabetes mellitus with hyperlipidemia (HCC)    PLAN:    In order of problems listed above:  1. VT No sustained episodes of VT after the VT ablation.  No device therapies. Discussed driving restrictions with the patient during today's visit given his episode of sustained VT.  I have asked the patient to avoid operating a motor vehicle for 6 months after his VT episode which was in late October.   2.  Chronic combined systolic and diastolic heart failure NYHA class II symptoms.  Warm and dry during  today's visit.  OptiVol shows volume status at baseline. Last EF 45% in October 2021  3.  Coronary disease post bypass No ischemic symptoms during today's visit   Follow-up 9 months or sooner as needed.   Medication Adjustments/Labs and Tests Ordered: Current medicines are reviewed at length with the patient today.  Concerns regarding medicines are outlined above.  Orders Placed This Encounter  Procedures  . EKG 12-Lead   No orders of the defined types were placed in this encounter.    Signed, Lars Mage, MD, [facility]  09/28/2020 4:45 PM    Electrophysiology [facility]

## 2020-09-28 NOTE — Patient Instructions (Signed)
Medication Instructions:  Your physician recommends that you continue on your current medications as directed. Please refer to the Current Medication list given to you today.  *If you need a refill on your cardiac medications before your next appointment, please call your pharmacy*   Lab Work: None ordered.  If you have labs (blood work) drawn today and your tests are completely normal, you will receive your results only by: . MyChart Message (if you have MyChart) OR . A paper copy in the mail If you have any lab test that is abnormal or we need to change your treatment, we will call you to review the results.   Testing/Procedures: None ordered.    Follow-Up: At [facility], you and your health needs are our priority.  As part of our continuing mission to provide you with exceptional heart care, we have created designated Provider Care Teams.  These Care Teams include your primary Cardiologist (physician) and Advanced Practice Providers (APPs -  Physician Assistants and Nurse Practitioners) who all work together to provide you with the care you need, when you need it.  We recommend signing up for the patient portal called "MyChart".  Sign up information is provided on this After Visit Summary.  MyChart is used to connect with patients for Virtual Visits (Telemedicine).  Patients are able to view lab/test results, encounter notes, upcoming appointments, etc.  Non-urgent messages can be sent to your provider as well.   To learn more about what you can do with MyChart, go to [email or web address].    Your next appointment:   9 month(s)  The format for your next appointment:   In Person  Provider:   Cameron Lambert, MD     

## 2020-10-02 ENCOUNTER — Telehealth: Payer: Self-pay | Admitting: Cardiology

## 2020-10-02 NOTE — Telephone Encounter (Signed)
Education done on ICD function. Questions answered.

## 2020-10-02 NOTE — Telephone Encounter (Signed)
Patient states he had a few questions regarding his device that he forgot to talk to Dr. Quentin Ore about regarding when dates/times his heart rate was elevated and the rate set that his defibrillator would go off. Sending to device clinic for follow up.

## 2020-10-02 NOTE — Telephone Encounter (Signed)
Patient had some follow up questions for Dr. Quentin Ore after his appointment from 09/28/20. The patient was shown a piece pf paper where his heart tried to speed up 3x but it was not successful. He wanted to know what those 3 days were. He wanted to know if there was something that he did at home that may have caused his heart to speed up.   Please call back to confirm dates  If the patient is not home he can be reached on his cell [phone]

## 2020-10-05 NOTE — Progress Notes (Signed)
Remote ICD transmission.   

## 2020-10-07 ENCOUNTER — Other Ambulatory Visit: Payer: Self-pay | Admitting: Family Medicine

## 2020-10-07 ENCOUNTER — Other Ambulatory Visit: Payer: Self-pay | Admitting: Physician Assistant

## 2020-10-13 ENCOUNTER — Ambulatory Visit: Payer: Medicare HMO | Admitting: Orthopedic Surgery

## 2020-10-14 ENCOUNTER — Ambulatory Visit: Payer: Medicare HMO | Admitting: Orthopaedic Surgery

## 2020-10-27 ENCOUNTER — Other Ambulatory Visit: Payer: Self-pay

## 2020-10-27 ENCOUNTER — Encounter: Payer: Self-pay | Admitting: Cardiology

## 2020-10-27 ENCOUNTER — Ambulatory Visit: Payer: Medicare HMO | Admitting: Cardiology

## 2020-10-27 VITALS — BP 124/70 | HR 80 | Ht 71.0 in | Wt 225.6 lb

## 2020-10-27 DIAGNOSIS — I6523 Occlusion and stenosis of bilateral carotid arteries: Secondary | ICD-10-CM

## 2020-10-27 DIAGNOSIS — I5022 Chronic systolic (congestive) heart failure: Secondary | ICD-10-CM

## 2020-10-27 DIAGNOSIS — E782 Mixed hyperlipidemia: Secondary | ICD-10-CM | POA: Diagnosis not present

## 2020-10-27 DIAGNOSIS — I472 Ventricular tachycardia, unspecified: Secondary | ICD-10-CM

## 2020-10-27 DIAGNOSIS — I10 Essential (primary) hypertension: Secondary | ICD-10-CM

## 2020-10-27 DIAGNOSIS — I2510 Atherosclerotic heart disease of native coronary artery without angina pectoris: Secondary | ICD-10-CM

## 2020-10-27 MED ORDER — FISH OIL 1000 MG PO CPDR: 1000.0000 mg | DELAYED_RELEASE_CAPSULE | Freq: Every day | ORAL | Status: DC

## 2020-10-27 NOTE — Progress Notes (Signed)
Cardiology Office Note:    Date:  10/27/2020   ID:  Cameron Daniel, DOB 1946-07-03, MRN [id number]  PCP:  [name], MD  Cardiologist:  [name], MD   Electrophysiologist:  Vickie Epley, MD  Advanced Heart Failure Clinic:  Glori Bickers, MD    Referring MD: [name], MD   Reason for visit: 3 months follow up  Patient Profile:    Cameron Daniel is a 75 y.o. male with HTN, HLP, DM, CAD, NSTEMI 03/2019 (large retroperitoneal bleed post cath)>> s/p CABG (Grafts: L-LAD, S-OM, S-dRCA), systolic CHF, Ischemic CM, EF 03/2019: 25-30 >> Intraop: EF 45-50, Not on spironolactone secondary to hyperkalemia, Echo 7/21: EF 45-50, GRII DD, Echocardiogram 03/17/2020,  EF 45-50, normal wall motion, moderate LVH, GRII DD, mildly reduced RV SF, mild LAE, trivial MR. He had a VT in 05/2020 and underwent a VT ablation on June 22, 2020 and a ICD implant on June 25, 2020. LHC at that time showed: 3VD and patent LIMA and SVGs to LCX and RCA. TTE in 05/2019 showed LVEF 45-50%, DD2. Since then he had 3 episodes of nonsustained VT that were asymptomatic and self terminating.  He is tolerating his medical therapy well.  Today he states that he is doing well, he denies any chest pain, SOB, able to [perform all ADL< however limited by his back pain.   Past Medical History:  Diagnosis Date  . Allergic rhinitis, cause unspecified   . Allergy   . Anxiety   . Arthritis   . Cancer ([place])   . Carotid Doppler 04/2020   Carotid US 9/21: No evidence of ICA stenosis bilaterally; right vertebral artery stenosis  . Cataract   . Chronic systolic CHF (congestive heart failure) ([place])   . Coronary atherosclerosis of unspecified type of vessel, native or graft    a. MI 1985 with unclear details. b. CABG 03/2019 with LIMA -> LAD, SVG -> OM, SVG to dRCA.  . Depression    PTSD   . Diverticulosis of colon (without mention of hemorrhage)   . Esophageal reflux   . Esophageal stricture   .  Essential hypertension   . Former tobacco use   . Hemorrhoids   . Hyperlipidemia   . Irritable bowel syndrome   . Ischemic cardiomyopathy   . Mild carotid artery disease (HCC)    a. 1-39% bilaterally 03/2019 duplex.  . Myocardial infarction ([place])   . Nocturia   . Obesity, unspecified   . Orthostasis   . Other acquired absence of organ   . Personal history of colonic polyps   . Psychosexual dysfunction with inhibited sexual excitement   . Retroperitoneal bleed   . Type II or unspecified type diabetes mellitus without mention of complication, not stated as uncontrolled     Current Medications: Current Meds  Medication Sig  . acetaminophen (TYLENOL) 500 MG tablet Take 1,000 mg by mouth at bedtime as needed for mild pain.  . Ascorbic Acid (VITAMIN C) 100 MG tablet Take 100 mg by mouth daily.  Marland Kitchen aspirin EC 81 MG tablet Take 1 tablet (81 mg total) by mouth daily.  . carvedilol (COREG) 3.125 MG tablet Take 3.125 mg by mouth 2 (two) times daily with a meal.  . citalopram (CELEXA) 10 MG tablet Take 1 tablet (10 mg total) by mouth daily.  . colestipol (COLESTID) 5 g packet Take 5 g by mouth daily.   . fexofenadine (ALLEGRA) 180 MG tablet Take 1 tablet (180 mg  total) by mouth daily as needed. For allergies  . finasteride (PROSCAR) 5 MG tablet Take 1 tablet (5 mg total) by mouth daily.  . fluticasone (FLONASE) 50 MCG/ACT nasal spray SPRAY 2 SPRAYS INTO EACH NOSTRIL EVERY DAY  . Hydrocortisone Acetate 2.5 % CREA Apply 1 application topically in the morning and at bedtime.  Marland Kitchen JANUVIA 50 MG tablet TAKE 1 TABLET BY MOUTH EVERY DAY  . JARDIANCE 25 MG TABS tablet TAKE 25 MG (1 TABLET) BY MOUTH DAILY BEFORE BREAKFAST. STOP PIOGLITAZONE  . ketotifen (ZADITOR) 0.025 % ophthalmic solution Place 1 drop into both eyes daily as needed (For itching).  Marland Kitchen losartan (COZAAR) 25 MG tablet TAKE 1 TABLET BY MOUTH EVERY DAY  . loteprednol (LOTEMAX) 0.5 % ophthalmic suspension Place 1 drop into both eyes 4 (four)  times daily as needed (itching).  . nystatin cream (MYCOSTATIN) Apply 1 application topically 3 (three) times daily.  . Omega-3 Fatty Acids (FISH OIL) 1000 MG CPDR Take 1,000 mg by mouth daily.  . pantoprazole (PROTONIX) 40 MG tablet TAKE 1 TABLET BY MOUTH EVERY DAY IN THE MORNING  . rosuvastatin (CRESTOR) 40 MG tablet TAKE 1 TABLET (40 MG TOTAL) BY MOUTH DAILY AT 6 PM.  . vitamin B-12 (CYANOCOBALAMIN) 500 MCG tablet Take 500 mcg by mouth daily.     Allergies:   Doxazosin mesylate, Flomax [tamsulosin hcl], Penicillins, Sulfonamide derivatives, and Trulicity [dulaglutide]   Social History   Tobacco Use  . Smoking status: Former Smoker    Quit date: 08/30/1983    Years since quitting: 37.1  . Smokeless tobacco: Former Systems developer  . Tobacco comment: Quit 30 years ago.  Vaping Use  . Vaping Use: Never used  Substance Use Topics  . Alcohol use: Yes    Comment: rare  . Drug use: No     Family Hx: The patient's family history includes Allergic rhinitis in his father; Coronary artery disease in his mother; Lung cancer in his father. There is no history of Colon cancer, Heart attack, Hypertension, Stroke, Esophageal cancer, Rectal cancer, Stomach cancer, or Pancreatic cancer.  ROS   EKGs/Labs/Other Test Reviewed:    EKG:  EKG is  ordered today.  The ekg ordered today demonstrates normal sinus rhythm, heart rate 87, inferior Q waves, anterolateral Q waves, QTC 500, IVCD-?  Incomplete left bundle branch block  Recent Labs: 03/11/2020: ALT 35 06/20/2020: Hemoglobin 14.6; Magnesium 1.8; Platelets 122 07/17/2020: BUN 17; Creat 0.83; Potassium 3.8; Sodium 137   Recent Lipid Panel Lab Results  Component Value Date/Time   CHOL 109 03/11/2020 08:04 AM   TRIG 269 (H) 03/11/2020 08:04 AM   TRIG 193 (H) 08/08/2006 09:24 AM   HDL 49 03/11/2020 08:04 AM   CHOLHDL 2.2 03/11/2020 08:04 AM   LDLCALC 28 03/11/2020 08:04 AM   LDLDIRECT 49.9 08/26/2013 10:11 AM    Physical Exam:    VS:  BP 124/70    Pulse 80   Ht 5\' 11"  (1.803 m)   Wt 225 lb 9.6 oz (102.3 kg)   SpO2 95%   BMI 31.46 kg/m     Wt Readings from Last 3 Encounters:  10/27/20 225 lb 9.6 oz (102.3 kg)  09/28/20 225 lb (102.1 kg)  09/07/20 225 lb (102.1 kg)     Constitutional:      Appearance: Healthy appearance. Not in distress.  Neck:     Thyroid: No thyromegaly.     Vascular: No carotid bruit. JVD normal.  Pulmonary:     Effort: Pulmonary  effort is normal.     Breath sounds: No wheezing. No rales.  Cardiovascular:     Normal rate. Regular rhythm. Normal S1. Normal S2.     Murmurs: There is no murmur.  Edema:    Peripheral edema absent.  Abdominal:     Palpations: Abdomen is soft. There is no hepatomegaly.  Skin:    General: Skin is warm and dry.  Neurological:     General: No focal deficit present.     Mental Status: Alert and oriented to person, place and time.     Cranial Nerves: Cranial nerves are intact.      ASSESSMENT & PLAN:    Coronary artery disease involving native coronary artery of native heart without angina pectoris S/p non-STEMI in 03/2019 followed by CABG. Cath in 05/2020 showed severe 3 VD and all grafts patent. He is doing well without anginal symptoms. Continue ASA, carvedilol, rosuvastatin, I will add omega 3 acids 1000 mg.   Ventricular tachycardia in 05/2020 - s/p ablation and ICD placement, followed by Dr Quentin Ore  Chronic systolic heart failure ([place]) EF improved from 25-30% to 45-50% by most recent echocardiogram in 05/2020.  Ischemic cardiomyopathy.  NYHA II.  Volume status appears stable. On carvedilol, losartan, Jardiance.   Essential hypertension The patient's blood pressure is controlled on his current regimen.    Mixed hyperlipidemia Recent LDL optimal.  Triglycerides were elevated.   I will add omega 3 acids 1000 mg.   Type 2 diabetes mellitus with hyperlipidemia ([place]) He remains on empagliflozin.  Bilateral carotid artery stenosis B/L 1-39% stenosis in 04/2020.     Dispo:  No follow-ups on file.   Medication Adjustments/Labs and Tests Ordered: Current medicines are reviewed at length with the patient today.  Concerns regarding medicines are outlined above.  Tests Ordered: No orders of the defined types were placed in this encounter.  Medication Changes: Meds ordered this encounter  Medications  . Omega-3 Fatty Acids (FISH OIL) 1000 MG CPDR    Sig: Take 1,000 mg by mouth daily.    Signed, [name], MD  10/27/2020 8:34 PM    [facility] [place], [place], [place]  [address] Phone: [phone]; Fax: [phone]

## 2020-10-27 NOTE — Patient Instructions (Addendum)
Medication Instructions:   START TAKING FISH OIL 1,000 MG BY MOUTH DAILY--YOU CAN PURCHASE THIS OVER THE COUNTER AT ANY LOCAL PHARMACY  *If you need a refill on your cardiac medications before your next appointment, please call your pharmacy*    Follow-Up: At [name], you and your health needs are our priority.  As part of our continuing mission to provide you with exceptional heart care, we have created designated Provider Care Teams.  These Care Teams include your primary Cardiologist (physician) and Advanced Practice Providers (APPs -  Physician Assistants and Nurse Practitioners) who all work together to provide you with the care you need, when you need it.  We recommend signing up for the patient portal called "MyChart".  Sign up information is provided on this After Visit Summary.  MyChart is used to connect with patients for Virtual Visits (Telemedicine).  Patients are able to view lab/test results, encounter notes, upcoming appointments, etc.  Non-urgent messages can be sent to your provider as well.   To learn more about what you can do with MyChart, go to [email or web address].    Your next appointment:   6 month(s)  The format for your next appointment:   In Person  Provider:   You may see Dr. Gwyndolyn Kaufman or one of the following Advanced Practice Providers on your designated Care Team:    Richardson Dopp, PA-C  [place], [place]

## 2020-10-28 ENCOUNTER — Ambulatory Visit: Payer: Self-pay

## 2020-10-28 ENCOUNTER — Ambulatory Visit: Payer: Medicare HMO | Admitting: Orthopaedic Surgery

## 2020-10-28 ENCOUNTER — Ambulatory Visit (INDEPENDENT_AMBULATORY_CARE_PROVIDER_SITE_OTHER): Payer: Medicare HMO

## 2020-10-28 ENCOUNTER — Encounter: Payer: Self-pay | Admitting: Orthopaedic Surgery

## 2020-10-28 DIAGNOSIS — M542 Cervicalgia: Secondary | ICD-10-CM | POA: Diagnosis not present

## 2020-10-28 DIAGNOSIS — G8929 Other chronic pain: Secondary | ICD-10-CM | POA: Diagnosis not present

## 2020-10-28 DIAGNOSIS — M25511 Pain in right shoulder: Secondary | ICD-10-CM | POA: Diagnosis not present

## 2020-10-28 MED ORDER — LIDOCAINE HCL 1 % IJ SOLN
2.0000 mL | INTRAMUSCULAR | Status: AC | PRN
  Administered 2020-10-28: 2 mL

## 2020-10-28 NOTE — Progress Notes (Signed)
Office Visit Note   Patient: Cameron Daniel           Date of Birth: [birth year]           MRN: [id number] Visit Date: 10/28/2020              Requested by: [name], MD [address] [facility] [address] [place],  [place] [address] PCP: [name], MD   Assessment & Plan: Visit Diagnoses:  1. Chronic right shoulder pain   2. Neck pain   3. Neck pain on right side     Plan: Cameron Daniel has evidence of significant osteoarthritis of the cervical spine by plain film.  I believe that is the cause of his present pain.  He has had a little discomfort in his right shoulder but no history of injury or trauma.  I suspect the pain is referred.  He is having 1 area of trigger point tenderness in the right parascapular region and asked that I inject that with cortisone.  This was performed without difficulty.  If he continues to have a problem I had like to see him back to consider either physical therapy or an MRI scan.  He also has a Tinel's over the ulnar nerve at the right elbow which I suspect is what is causing some of the tingling into the ulnar 2 digits.  I am not sure this is referred from his neck  Follow-Up Instructions: Return if symptoms worsen or fail to improve.   Orders:  Orders Placed This Encounter  Procedures  . Trigger Point Inj  . XR Cervical Spine 2 or 3 views   No orders of the defined types were placed in this encounter.     Procedures: Trigger Point Inj  Date/Time: 10/28/2020 12:38 PM Performed by: Garald Balding, MD Authorized by: Garald Balding, MD   Consent Given by:  Patient Site marked: the procedure site was marked   Indications:  Pain Total # of Trigger Points:  1 Location: neck   Needle Size:  27 G Approach:  Dorsal Medications #1:  2 mL lidocaine 1 % Comments: 6 mg betamethasone injected with lidocaine into the area of trigger point tenderness in the right parascapular region     Clinical Data: No additional  findings.   Subjective: Chief Complaint  Patient presents with  . Right Shoulder - Pain  Patient presents today for right shoulder pain. He states that he went into A-fib and had to be shocked 79months ago. Since then he has had right sided neck pain that radiates into his shoulder and scapula. He said that he has pain that radiates down his arm. He has numbness in his fourth and fifth fingers of his right hand. He feels popping when he rotates his head side to side. He is right right hand dominant. He takes Tylenol as needed.   HPI  Review of Systems   Objective: Vital Signs: There were no vitals taken for this visit.  Physical Exam Constitutional:      Appearance: He is well-developed and well-nourished.  HENT:     Mouth/Throat:     Mouth: Oropharynx is clear and moist.  Eyes:     Extraocular Movements: EOM normal.     Pupils: Pupils are equal, round, and reactive to light.  Pulmonary:     Effort: Pulmonary effort is normal.  Skin:    General: Skin is warm and dry.  Neurological:     Mental Status:  He is alert and oriented to person, place, and time.  Psychiatric:        Mood and Affect: Mood and affect normal.        Behavior: Behavior normal.     Ortho Exam awake alert and oriented x3.  Comfortable sitting.  Considerable decreased motion of the cervical spine.  He lacks about 2 fingerbreadths from touching his chin to his chest and only had about 50% of normal neck extension.  Only about 60% of normal rotation to the right and about 70% of normal motion to the left.  No motion created pain in either upper extremity.  He does have an area of trigger point tenderness along the vertebral border of the right proximal scapula.  No masses.  Negative impingement.  No localized areas of tenderness anteriorly or laterally  Specialty Comments:  No specialty comments available.  Imaging: XR Cervical Spine 2 or 3 views  Result Date: 10/28/2020 Films of the cervical spine were  obtained in 2 projections.  There is straightening of the normal cervical lordosis.  Considerable narrowing of the disc spaces at C5-6, C 6 7 and C4-5.  No listhesis.  Facet changes at the above 3 levels as well consistent with osteoarthritis    PMFS History: Patient Active Problem List   Diagnosis Date Noted  . Neck pain on right side 10/28/2020  . Urolithiasis 08/06/2020  . Hypercalcemia 08/06/2020  . Ventricular tachycardia ([place]) 06/19/2020  . Low back pain 01/02/2020  . Lumbar radiculopathy 11/30/2019  . Acquired thrombophilia ([place]) 10/08/2019  . 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 Hyponatremia 04/20/2019  . Retroperitoneal hematoma 04/18/2019  . Acute on chronic blood loss anemia 04/18/2019  . Acute systolic HF (heart failure) ([place]) 04/15/2019  . Non-ST elevation (NSTEMI) myocardial infarction ([place]) 04/15/2019  . AKI (acute kidney injury) ([place])   . Community acquired pneumonia of right lower lobe of lung 04/12/2019  . Acute respiratory failure ([place]) 04/12/2019  . 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  . Knee pain 06/07/2018  . Allergic conjunctivitis 01/23/2018  . Chronic sinusitis 01/23/2018  . Personal history of other malignant neoplasm of skin 12/18/2014  . Gout 04/06/2012  . DIVERTICULITIS-COLON 06/18/2010  . FACIAL FLUSHING 12/11/2007  . NOCTURIA 12/11/2007  . Type 2 diabetes mellitus with hyperlipidemia ([place]) 09/07/2007  . OTHER MALAISE AND FATIGUE 09/07/2007  . Allergic rhinitis 08/16/2007  . HLD (hyperlipidemia) 01/24/2007  . OBESITY 01/24/2007  . ERECTILE DYSFUNCTION 01/24/2007  . HYPERTENSION 01/24/2007  . Coronary artery disease involving native coronary artery of native heart with angina pectoris ([place]) 01/24/2007  . GERD 01/24/2007  . DIVERTICULOSIS, COLON 01/24/2007  . IRRITABLE BOWEL SYNDROME 01/24/2007  . COLONIC POLYPS, HX OF 01/24/2007  . CHOLECYSTECTOMY, HX OF 01/24/2007   Past Medical History:  Diagnosis Date  . Allergic rhinitis, cause  unspecified   . Allergy   . Anxiety   . Arthritis   . Cancer ([place])   . Carotid Doppler 04/2020   Carotid US 9/21: No evidence of ICA stenosis bilaterally; right vertebral artery stenosis  . Cataract   . Chronic systolic CHF (congestive heart failure) ([place])   . Coronary atherosclerosis of unspecified type of vessel, native or graft    a. MI 1985 with unclear details. b. CABG 03/2019 with LIMA -> LAD, SVG -> OM, SVG to dRCA.  . Depression    PTSD   . Diverticulosis of colon (without mention of hemorrhage)   . Esophageal reflux   . Esophageal stricture   .  Essential hypertension   . Former tobacco use   . Hemorrhoids   . Hyperlipidemia   . Irritable bowel syndrome   . Ischemic cardiomyopathy   . Mild carotid artery disease (HCC)    a. 1-39% bilaterally 03/2019 duplex.  . Myocardial infarction ([place])   . Nocturia   . Obesity, unspecified   . Orthostasis   . Other acquired absence of organ   . Personal history of colonic polyps   . Psychosexual dysfunction with inhibited sexual excitement   . Retroperitoneal bleed   . Type II or unspecified type diabetes mellitus without mention of complication, not stated as uncontrolled     Family History  Problem Relation Age of Onset  . Lung cancer Father   . Allergic rhinitis Father   . Coronary artery disease Mother   . Colon cancer Neg Hx   . Heart attack Neg Hx   . Hypertension Neg Hx   . Stroke Neg Hx   . Esophageal cancer Neg Hx   . Rectal cancer Neg Hx   . Stomach cancer Neg Hx   . Pancreatic cancer Neg Hx     Past Surgical History:  Procedure Laterality Date  . ADENOIDECTOMY    . CARPAL TUNNEL RELEASE     left   . CHOLECYSTECTOMY    . COLON RESECTION     18 inches  . COLON SURGERY  2013  . CORONARY ANGIOPLASTY     1985   . CORONARY ARTERY BYPASS GRAFT N/A 04/26/2019   Procedure: CORONARY ARTERY BYPASS GRAFTING (CABG) x Three, using left internal mammary artery and right leg greater saphenous vein harvested  endoscopically;  Surgeon: Gaye Pollack, MD;  Location: [facility];  Service: Open Heart Surgery;  Laterality: N/A;  . ELBOW BURSA SURGERY    . EYE SURGERY    . ICD IMPLANT N/A 06/25/2020   Procedure: ICD IMPLANT;  Surgeon: Vickie Epley, MD;  Location: [facility];  Service: Cardiovascular;  Laterality: N/A;  . LEFT HEART CATH AND CORS/GRAFTS ANGIOGRAPHY N/A 06/19/2020   Procedure: LEFT HEART CATH AND CORS/GRAFTS ANGIOGRAPHY;  Surgeon: Troy Sine, MD;  Location: [facility];  Service: Cardiovascular;  Laterality: N/A;  . POLYPECTOMY    . RIGHT/LEFT HEART CATH AND CORONARY ANGIOGRAPHY N/A 04/16/2019   Procedure: RIGHT/LEFT HEART CATH AND CORONARY ANGIOGRAPHY;  Surgeon: Belva Crome, MD;  Location: [facility];  Service: Cardiovascular;  Laterality: N/A;  . SHOULDER SURGERY     Rt. Shoulder  . TEE WITHOUT CARDIOVERSION N/A 04/26/2019   Procedure: TRANSESOPHAGEAL ECHOCARDIOGRAM (TEE);  Surgeon: Gaye Pollack, MD;  Location: [facility];  Service: Open Heart Surgery;  Laterality: N/A;  . TONSILLECTOMY    . V TACH ABLATION N/A 06/22/2020   Procedure: V TACH ABLATION;  Surgeon: Vickie Epley, MD;  Location: [facility];  Service: Cardiovascular;  Laterality: N/A;   Social History   Occupational History  . 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 Smoking status: Former Smoker    Quit date: 08/30/1983    Years since quitting: 37.1  . Smokeless tobacco: Former Systems developer  . Tobacco comment: Quit 30 years ago.  Vaping Use  . Vaping Use: Never used  Substance and Sexual Activity  . Alcohol use: Yes    Comment: rare  . Drug use: No  . Sexual activity: Yes

## 2020-11-05 DIAGNOSIS — M1711 Unilateral primary osteoarthritis, right knee: Secondary | ICD-10-CM | POA: Diagnosis not present

## 2020-11-12 DIAGNOSIS — M1711 Unilateral primary osteoarthritis, right knee: Secondary | ICD-10-CM | POA: Diagnosis not present

## 2020-11-19 DIAGNOSIS — M1711 Unilateral primary osteoarthritis, right knee: Secondary | ICD-10-CM | POA: Diagnosis not present

## 2020-11-27 ENCOUNTER — Telehealth: Payer: Self-pay | Admitting: Family Medicine

## 2020-11-27 MED ORDER — NYSTATIN 100000 UNIT/GM EX CREA: 1.0000 "application " | TOPICAL_CREAM | Freq: Three times a day (TID) | CUTANEOUS | 0 refills | Status: DC

## 2020-11-27 NOTE — Telephone Encounter (Signed)
Patient stopped by and requesting a refill on his nystatin cream/powder for yeast infection that he states comes from his jardiance he uses [facility] Phone  [phone]  CB# [phone]

## 2020-12-04 ENCOUNTER — Other Ambulatory Visit: Payer: Self-pay | Admitting: *Deleted

## 2020-12-04 MED ORDER — CITALOPRAM HYDROBROMIDE 10 MG PO TABS: 10.0000 mg | ORAL_TABLET | Freq: Every day | ORAL | 2 refills | Status: DC

## 2020-12-16 ENCOUNTER — Ambulatory Visit (INDEPENDENT_AMBULATORY_CARE_PROVIDER_SITE_OTHER): Payer: Medicare HMO | Admitting: Family Medicine

## 2020-12-16 ENCOUNTER — Other Ambulatory Visit: Payer: Self-pay

## 2020-12-16 ENCOUNTER — Encounter: Payer: Self-pay | Admitting: Family Medicine

## 2020-12-16 ENCOUNTER — Telehealth: Payer: Self-pay | Admitting: *Deleted

## 2020-12-16 VITALS — BP 122/66 | HR 72 | Temp 98.1°F | Resp 16 | Ht 71.0 in | Wt 224.0 lb

## 2020-12-16 DIAGNOSIS — Z951 Presence of aortocoronary bypass graft: Secondary | ICD-10-CM | POA: Diagnosis not present

## 2020-12-16 DIAGNOSIS — M542 Cervicalgia: Secondary | ICD-10-CM | POA: Diagnosis not present

## 2020-12-16 DIAGNOSIS — E118 Type 2 diabetes mellitus with unspecified complications: Secondary | ICD-10-CM | POA: Diagnosis not present

## 2020-12-16 DIAGNOSIS — I5022 Chronic systolic (congestive) heart failure: Secondary | ICD-10-CM | POA: Diagnosis not present

## 2020-12-16 MED ORDER — PREDNISONE 20 MG PO TABS: ORAL_TABLET | ORAL | 0 refills | Status: DC

## 2020-12-16 MED ORDER — DICLOFENAC SODIUM 1 % EX GEL: 2.0000 g | Freq: Four times a day (QID) | CUTANEOUS | 0 refills | Status: DC

## 2020-12-16 NOTE — Telephone Encounter (Signed)
Received request from pharmacy for PA on Diclofenac Gel.   PA submitted.   Dx: M18-OA  Your information has been submitted to [place] Medicare Part D. [employer] Part D will review the request and will issue a decision, typically within 1-3 days from your submission. You can check the updated outcome later by reopening this request.  If [employer] has not responded in 1-3 days or if you have any questions about your ePA request, please contact [place] Medicare Part D at [phone]. If you think there may be a problem with your PA request, use our live chat feature at the bottom right.

## 2020-12-16 NOTE — Progress Notes (Signed)
Subjective:    Patient ID: Cameron Daniel, male    DOB: 12/07/1945, 75 y.o.   MRN: [id number]  HPI  Patient presents today complaining of right-sided neck pain.  He states that ever since he was cardioverted in the hospital, he has been having pain and tenderness in his neck.  He points to the sternocleidomastoid muscle.  The pain originates around the mastoid process and radiates down his neck however instead of going to the clavicle, it stays posterior to the clavicle.  Is made worse by turning his head to the right side.  He reports crepitus in the neck.  He reports pain and stiffness.  He has a hard time sleeping.  He has to hold his head completely still at night due to the pain in order to get comfortable enough that he can sleep.  He has the hold his head and just the right spot to alleviate the pain.  He denies any numbness or tingling radiating down his arm.  He denies any weakness in his arm.  He has normal strength in his right arm.  He does have some tenderness to palpation over the sternocleidomastoid muscle as well as the trapezius muscle however he states that the pain is deeper.  Past Medical History:  Diagnosis Date  . Allergic rhinitis, cause unspecified   . Allergy   . Anxiety   . Arthritis   . Cancer ([place])   . Carotid Doppler 04/2020   Carotid US 9/21: No evidence of ICA stenosis bilaterally; right vertebral artery stenosis  . Cataract   . Chronic systolic CHF (congestive heart failure) ([place])   . Coronary atherosclerosis of unspecified type of vessel, native or graft    a. MI 1985 with unclear details. b. CABG 03/2019 with LIMA -> LAD, SVG -> OM, SVG to dRCA.  . Depression    PTSD   . Diverticulosis of colon (without mention of hemorrhage)   . Esophageal reflux   . Esophageal stricture   . Essential hypertension   . Former tobacco use   . Hemorrhoids   . Hyperlipidemia   . Irritable bowel syndrome   . Ischemic cardiomyopathy   . Mild carotid artery disease  (HCC)    a. 1-39% bilaterally 03/2019 duplex.  . Myocardial infarction ([place])   . Nocturia   . Obesity, unspecified   . Orthostasis   . Other acquired absence of organ   . Personal history of colonic polyps   . Psychosexual dysfunction with inhibited sexual excitement   . Retroperitoneal bleed   . Type II or unspecified type diabetes mellitus without mention of complication, not stated as uncontrolled    Past Surgical History:  Procedure Laterality Date  . ADENOIDECTOMY    . CARPAL TUNNEL RELEASE     left   . CHOLECYSTECTOMY    . COLON RESECTION     18 inches  . COLON SURGERY  2013  . CORONARY ANGIOPLASTY     1985   . CORONARY ARTERY BYPASS GRAFT N/A 04/26/2019   Procedure: CORONARY ARTERY BYPASS GRAFTING (CABG) x Three, using left internal mammary artery and right leg greater saphenous vein harvested endoscopically;  Surgeon: Gaye Pollack, MD;  Location: [facility];  Service: Open Heart Surgery;  Laterality: N/A;  . ELBOW BURSA SURGERY    . EYE SURGERY    . ICD IMPLANT N/A 06/25/2020   Procedure: ICD IMPLANT;  Surgeon: Vickie Epley, MD;  Location: [facility];  Service: Cardiovascular;  Laterality: N/A;  . LEFT HEART CATH AND CORS/GRAFTS ANGIOGRAPHY N/A 06/19/2020   Procedure: LEFT HEART CATH AND CORS/GRAFTS ANGIOGRAPHY;  Surgeon: Troy Sine, MD;  Location: [facility];  Service: Cardiovascular;  Laterality: N/A;  . POLYPECTOMY    . RIGHT/LEFT HEART CATH AND CORONARY ANGIOGRAPHY N/A 04/16/2019   Procedure: RIGHT/LEFT HEART CATH AND CORONARY ANGIOGRAPHY;  Surgeon: Belva Crome, MD;  Location: [facility];  Service: Cardiovascular;  Laterality: N/A;  . SHOULDER SURGERY     Rt. Shoulder  . TEE WITHOUT CARDIOVERSION N/A 04/26/2019   Procedure: TRANSESOPHAGEAL ECHOCARDIOGRAM (TEE);  Surgeon: Gaye Pollack, MD;  Location: [facility];  Service: Open Heart Surgery;  Laterality: N/A;  . TONSILLECTOMY    . V TACH ABLATION N/A 06/22/2020   Procedure: V TACH  ABLATION;  Surgeon: Vickie Epley, MD;  Location: [facility];  Service: Cardiovascular;  Laterality: N/A;   Current Outpatient Medications on File Prior to Visit  Medication Sig Dispense Refill  . acetaminophen (TYLENOL) 500 MG tablet Take 1,000 mg by mouth at bedtime as needed for mild pain.    . Ascorbic Acid (VITAMIN C) 100 MG tablet Take 100 mg by mouth daily.    Marland Kitchen aspirin EC 81 MG tablet Take 1 tablet (81 mg total) by mouth daily. 90 tablet 3  . carvedilol (COREG) 3.125 MG tablet Take 3.125 mg by mouth 2 (two) times daily with a meal.    . citalopram (CELEXA) 10 MG tablet Take 1 tablet (10 mg total) by mouth daily. 90 tablet 2  . colestipol (COLESTID) 5 g packet Take 5 g by mouth daily.     . fexofenadine (ALLEGRA) 180 MG tablet Take 1 tablet (180 mg total) by mouth daily as needed. For allergies 90 tablet 0  . finasteride (PROSCAR) 5 MG tablet Take 1 tablet (5 mg total) by mouth daily. 90 tablet 3  . fluticasone (FLONASE) 50 MCG/ACT nasal spray SPRAY 2 SPRAYS INTO EACH NOSTRIL EVERY DAY 48 mL 2  . Hydrocortisone Acetate 2.5 % CREA Apply 1 application topically in the morning and at bedtime. 30 g 1  . JANUVIA 50 MG tablet TAKE 1 TABLET BY MOUTH EVERY DAY 30 tablet 5  . JARDIANCE 25 MG TABS tablet TAKE 25 MG (1 TABLET) BY MOUTH DAILY BEFORE BREAKFAST. STOP PIOGLITAZONE 90 tablet 1  . ketotifen (ZADITOR) 0.025 % ophthalmic solution Place 1 drop into both eyes daily as needed (For itching).    Marland Kitchen losartan (COZAAR) 25 MG tablet TAKE 1 TABLET BY MOUTH EVERY DAY 90 tablet 2  . loteprednol (LOTEMAX) 0.5 % ophthalmic suspension Place 1 drop into both eyes 4 (four) times daily as needed (itching). 5 mL 1  . nystatin cream (MYCOSTATIN) Apply 1 application topically 3 (three) times daily. 60 g 0  . Omega-3 Fatty Acids (FISH OIL) 1000 MG CPDR Take 1,000 mg by mouth daily.    . pantoprazole (PROTONIX) 40 MG tablet TAKE 1 TABLET BY MOUTH EVERY DAY IN THE MORNING 90 tablet 3  . rosuvastatin  (CRESTOR) 40 MG tablet TAKE 1 TABLET (40 MG TOTAL) BY MOUTH DAILY AT 6 PM. 90 tablet 3  . vitamin B-12 (CYANOCOBALAMIN) 500 MCG tablet Take 500 mcg by mouth daily.     No current facility-administered medications on file prior to visit.   Allergies  Allergen Reactions  . Doxazosin Mesylate Other (See Comments)    Drops in blood pressure   . Flomax [Tamsulosin Hcl] Itching and  Other (See Comments)    Drops in blood pressure    . Penicillins Rash and Other (See Comments)    Has patient had a PCN reaction causing immediate rash, facial/tongue/throat swelling, SOB or lightheadedness with hypotension: No Has patient had a PCN reaction causing severe rash involving mucus membranes or skin necrosis: No Has patient had a PCN reaction that required hospitalization No Has patient had a PCN reaction occurring within the last 10 years: No If all of the above answers are "NO", then may proceed with Cephalosporin use.   . Sulfonamide Derivatives Rash  . Trulicity [Dulaglutide] Nausea And Vomiting    N&V, Dizziness   Social History   Socioeconomic History  . Marital status: Married    Spouse name: Not on file  . Number of children: 1  . Years of education: 84  . Highest education level: High school graduate  Occupational History  . 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 Smoking status: Former Smoker    Quit date: 08/30/1983    Years since quitting: 37.3  . Smokeless tobacco: Former Systems developer  . Tobacco comment: Quit 30 years ago.  Vaping Use  . Vaping Use: Never used  Substance and Sexual Activity  . Alcohol use: Yes    Comment: rare  . Drug use: No  . Sexual activity: Yes  Other Topics Concern  . Not on file  Social History Narrative  . Not on file   Social Determinants of Health   Financial Resource Strain: Not on file  Food Insecurity: Not on file  Transportation Needs: Not on file  Physical Activity: Not on file  Stress: Not on file  Social Connections: Not on file  Intimate  Partner Violence: Not on file     Review of Systems     Objective:   Physical Exam Vitals reviewed.  Constitutional:      Appearance: Normal appearance.  Neck:   Cardiovascular:     Rate and Rhythm: Normal rate and regular rhythm.     Pulses: Normal pulses.     Heart sounds: Normal heart sounds. No murmur heard.   Pulmonary:     Effort: Pulmonary effort is normal. No respiratory distress.     Breath sounds: Normal breath sounds. No wheezing, rhonchi or rales.  Musculoskeletal:     Cervical back: Crepitus present. No erythema. Pain with movement and muscular tenderness present. Decreased range of motion.     Right lower leg: No edema.     Left lower leg: No edema.  Neurological:     Mental Status: He is alert.           Assessment & Plan:  Controlled type 2 diabetes mellitus with complication, without long-term current use of insulin ([place]) - Plan: COMPLETE METABOLIC PANEL WITH GFR, CBC with Differential/Platelet, Hemoglobin Y6T  Chronic systolic congestive heart failure (HCC)  S/P CABG x 3  Neck pain  Based on the crepitus, the decreased range of motion, I feel that some of this is due to degenerative disc disease and arthritis in the neck.  However I also believe that he is experiencing tenderness due to compensatory muscle spasms in the sternocleidomastoid and trapezius muscle.  I will try to treat arthritis with a prednisone taper pack in an effort to calm it down initially and then switch over to Voltaren gel which she can use 2-3 times a day thereafter.  If this is not helping, I would obtain an x-ray of the neck and also try muscle  relaxers as I feel some of the pain and stiffness is most likely muscular.  Patient is overdue to recheck his A1c given his history of diabetes.  His last A1c was 7.3 in July.  He would like to come back tomorrow fasting for an A1c and a lipid panel.  Given his history of CABG, his goal LDL cholesterol is less than 70.  Given his history  of coronary artery disease and heart failure, I want to avoid NSAIDs except for the topical Voltaren.

## 2020-12-16 NOTE — Telephone Encounter (Signed)
Received PA determination.   PA approved 08/29/2020- 08/28/2021.  Pharmacy made aware

## 2020-12-24 ENCOUNTER — Ambulatory Visit (INDEPENDENT_AMBULATORY_CARE_PROVIDER_SITE_OTHER): Payer: Medicare HMO

## 2020-12-24 DIAGNOSIS — I472 Ventricular tachycardia, unspecified: Secondary | ICD-10-CM

## 2020-12-24 LAB — CUP PACEART REMOTE DEVICE CHECK
Battery Remaining Longevity: 135 mo
Battery Voltage: 3.1 V
Brady Statistic RV Percent Paced: 0.01 %
Date Time Interrogation Session: 20220428022705
HighPow Impedance: 73 Ohm
Implantable Lead Implant Date: 20211028
Implantable Lead Location: 753860
Implantable Pulse Generator Implant Date: 20211028
Lead Channel Impedance Value: 456 Ohm
Lead Channel Impedance Value: 532 Ohm
Lead Channel Pacing Threshold Amplitude: 0.5 V
Lead Channel Pacing Threshold Pulse Width: 0.4 ms
Lead Channel Sensing Intrinsic Amplitude: 21.625 mV
Lead Channel Sensing Intrinsic Amplitude: 21.625 mV
Lead Channel Setting Pacing Amplitude: 2 V
Lead Channel Setting Pacing Pulse Width: 0.4 ms
Lead Channel Setting Sensing Sensitivity: 0.3 mV

## 2020-12-29 ENCOUNTER — Other Ambulatory Visit: Payer: Self-pay | Admitting: Physician Assistant

## 2021-01-12 ENCOUNTER — Telehealth: Payer: Self-pay | Admitting: Family Medicine

## 2021-01-12 NOTE — Telephone Encounter (Signed)
Left message for patient to call back and schedule Medicare Annual Wellness Visit (AWV) in office.   If not able to come in office, please offer to do virtually or by telephone.   Last AWV: 02/15/2019   Please schedule at anytime with [facility]-Nurse Health Advisor.  If any questions, please contact me at [phone]

## 2021-01-14 NOTE — Progress Notes (Signed)
Remote ICD transmission.   

## 2021-01-15 ENCOUNTER — Other Ambulatory Visit: Payer: Self-pay

## 2021-01-15 ENCOUNTER — Other Ambulatory Visit: Payer: Medicare HMO

## 2021-01-15 DIAGNOSIS — I5022 Chronic systolic (congestive) heart failure: Secondary | ICD-10-CM | POA: Diagnosis not present

## 2021-01-15 DIAGNOSIS — E118 Type 2 diabetes mellitus with unspecified complications: Secondary | ICD-10-CM | POA: Diagnosis not present

## 2021-01-15 DIAGNOSIS — Z951 Presence of aortocoronary bypass graft: Secondary | ICD-10-CM | POA: Diagnosis not present

## 2021-01-16 LAB — COMPLETE METABOLIC PANEL WITH GFR
AG Ratio: 2.4 (calc) (ref 1.0–2.5)
ALT: 22 U/L (ref 9–46)
AST: 24 U/L (ref 10–35)
Albumin: 4.1 g/dL (ref 3.6–5.1)
Alkaline phosphatase (APISO): 56 U/L (ref 35–144)
BUN: 15 mg/dL (ref 7–25)
CO2: 31 mmol/L (ref 20–32)
Calcium: 9.9 mg/dL (ref 8.6–10.3)
Chloride: 101 mmol/L (ref 98–110)
Creat: 0.84 mg/dL (ref 0.70–1.18)
GFR, Est African American: 100 mL/min/{1.73_m2} (ref >=60)
GFR, Est Non African American: 86 mL/min/{1.73_m2} (ref >=60)
Globulin: 1.7 g/dL (calc) — ABNORMAL LOW (ref 1.9–3.7)
Glucose, Bld: 131 mg/dL — ABNORMAL HIGH (ref 65–99)
Potassium: 4.1 mmol/L (ref 3.5–5.3)
Sodium: 138 mmol/L (ref 135–146)
Total Bilirubin: 0.6 mg/dL (ref 0.2–1.2)
Total Protein: 5.8 g/dL — ABNORMAL LOW (ref 6.1–8.1)

## 2021-01-16 LAB — LIPID PANEL
Cholesterol: 110 mg/dL (ref <200)
HDL: 50 mg/dL (ref >=40)
LDL Cholesterol (Calc): 30 mg/dL (calc)
Non-HDL Cholesterol (Calc): 60 mg/dL (calc) (ref <130)
Total CHOL/HDL Ratio: 2.2 (calc) (ref <5.0)
Triglycerides: 254 mg/dL — ABNORMAL HIGH (ref <150)

## 2021-01-16 LAB — CBC WITH DIFFERENTIAL/PLATELET
Absolute Monocytes: 490 cells/uL (ref 200–950)
Basophils Absolute: 39 cells/uL (ref 0–200)
Basophils Relative: 0.7 %
Eosinophils Absolute: 138 cells/uL (ref 15–500)
Eosinophils Relative: 2.5 %
HCT: 46.3 % (ref 38.5–50.0)
Hemoglobin: 15.2 g/dL (ref 13.2–17.1)
Lymphs Abs: 1942 cells/uL (ref 850–3900)
MCH: 28.9 pg (ref 27.0–33.0)
MCHC: 32.8 g/dL (ref 32.0–36.0)
MCV: 88 fL (ref 80.0–100.0)
MPV: 10.1 fL (ref 7.5–12.5)
Monocytes Relative: 8.9 %
Neutro Abs: 2893 cells/uL (ref 1500–7800)
Neutrophils Relative %: 52.6 %
Platelets: 136 10*3/uL — ABNORMAL LOW (ref 140–400)
RBC: 5.26 10*6/uL (ref 4.20–5.80)
RDW: 14.6 % (ref 11.0–15.0)
Total Lymphocyte: 35.3 %
WBC: 5.5 10*3/uL (ref 3.8–10.8)

## 2021-01-16 LAB — HEMOGLOBIN A1C
Hgb A1c MFr Bld: 7 % of total Hgb — ABNORMAL HIGH (ref <5.7)
Mean Plasma Glucose: 154 mg/dL
eAG (mmol/L): 8.5 mmol/L

## 2021-01-19 ENCOUNTER — Other Ambulatory Visit: Payer: Self-pay | Admitting: Physician Assistant

## 2021-01-19 ENCOUNTER — Telehealth: Payer: Self-pay | Admitting: Family Medicine

## 2021-01-19 NOTE — Telephone Encounter (Signed)
Please advise 

## 2021-01-19 NOTE — Telephone Encounter (Signed)
Pt called wanting to ask about super beta prostate to help with slowing down with urination at night. He just wanted to ask if that would be ok to take with his heart condition. Pt also wanted to ask about getting a different med than Januvia, pt stated that the [place] suggests that he try another med that may not be so expensive. Please call pt, if unreachable you may leave a detailed vm on phone.  Cb#: [phone]

## 2021-01-21 NOTE — Telephone Encounter (Signed)
Beta prostate would be fine.   CHeap options instead of januvia are limited to metformin (diarrhea) or glipizide (hypoglycemic episodes).  Actos is not safe given his history of CHF. WOuld he like to replace [place] with one of those two options

## 2021-01-21 NOTE — Telephone Encounter (Signed)
Call placed to patient. LMTRC.  

## 2021-01-22 ENCOUNTER — Ambulatory Visit (INDEPENDENT_AMBULATORY_CARE_PROVIDER_SITE_OTHER): Payer: Medicare HMO | Admitting: Family Medicine

## 2021-01-22 ENCOUNTER — Other Ambulatory Visit: Payer: Self-pay

## 2021-01-22 ENCOUNTER — Encounter: Payer: Self-pay | Admitting: Family Medicine

## 2021-01-22 VITALS — BP 134/88 | HR 72 | Temp 98.2°F | Resp 14 | Ht 71.0 in | Wt 224.0 lb

## 2021-01-22 DIAGNOSIS — J302 Other seasonal allergic rhinitis: Secondary | ICD-10-CM | POA: Diagnosis not present

## 2021-01-22 MED ORDER — MONTELUKAST SODIUM 10 MG PO TABS: 10.0000 mg | ORAL_TABLET | Freq: Every day | ORAL | 3 refills | Status: DC

## 2021-01-22 MED ORDER — METHYLPREDNISOLONE ACETATE 40 MG/ML IJ SUSP
60.0000 mg | Freq: Once | INTRAMUSCULAR | Status: AC
  Administered 2021-01-22: 60 mg via INTRAMUSCULAR

## 2021-01-22 NOTE — Addendum Note (Signed)
Addended by: Sheral Flow on: 01/22/2021 11:23 AM   Modules accepted: Orders

## 2021-01-22 NOTE — Progress Notes (Signed)
Subjective:    Patient ID: Cameron Daniel, male    DOB: [birth year], 75 y.o.   MRN: [id number]  HPI  Patient is having a terrible time with his allergies.  He is having red itchy watery eyes, sneezing, drainage, postnasal drip, head congestion.  He is on Allegra.  He is also on Flonase.  Nothing seems to be helping control his symptoms.  He denies any fevers or chills or sinus pain.  [facility] no longer [facility] Januvia and is costing him $140.  However the patient states that they will cover Onglyza and he is questioning if this will be reasonable.  I explained to the patient that this is virtually identical and would be a fine option if he wanted to switch to Onglyza Past Medical History:  Diagnosis Date  . Allergic rhinitis, cause unspecified   . Allergy   . Anxiety   . Arthritis   . Cancer ([place])   . Carotid Doppler 04/2020   Carotid US 9/21: No evidence of ICA stenosis bilaterally; right vertebral artery stenosis  . Cataract   . Chronic systolic CHF (congestive heart failure) ([place])   . Coronary atherosclerosis of unspecified type of vessel, native or graft    a. MI 1985 with unclear details. b. CABG 03/2019 with LIMA -> LAD, SVG -> OM, SVG to dRCA.  . Depression    PTSD   . Diverticulosis of colon (without mention of hemorrhage)   . Esophageal reflux   . Esophageal stricture   . Essential hypertension   . Former tobacco use   . Hemorrhoids   . Hyperlipidemia   . Irritable bowel syndrome   . Ischemic cardiomyopathy   . Mild carotid artery disease (HCC)    a. 1-39% bilaterally 03/2019 duplex.  . Myocardial infarction ([place])   . Nocturia   . Obesity, unspecified   . Orthostasis   . Other acquired absence of organ   . Personal history of colonic polyps   . Psychosexual dysfunction with inhibited sexual excitement   . Retroperitoneal bleed   . Type II or unspecified type diabetes mellitus without mention of complication, not stated as uncontrolled    Past Surgical  History:  Procedure Laterality Date  . ADENOIDECTOMY    . CARPAL TUNNEL RELEASE     left   . CHOLECYSTECTOMY    . COLON RESECTION     18 inches  . COLON SURGERY  2013  . CORONARY ANGIOPLASTY     1985   . CORONARY ARTERY BYPASS GRAFT N/A 04/26/2019   Procedure: CORONARY ARTERY BYPASS GRAFTING (CABG) x Three, using left internal mammary artery and right leg greater saphenous vein harvested endoscopically;  Surgeon: Gaye Pollack, MD;  Location: [facility];  Service: Open Heart Surgery;  Laterality: N/A;  . ELBOW BURSA SURGERY    . EYE SURGERY    . ICD IMPLANT N/A 06/25/2020   Procedure: ICD IMPLANT;  Surgeon: Vickie Epley, MD;  Location: [facility];  Service: Cardiovascular;  Laterality: N/A;  . LEFT HEART CATH AND CORS/GRAFTS ANGIOGRAPHY N/A 06/19/2020   Procedure: LEFT HEART CATH AND CORS/GRAFTS ANGIOGRAPHY;  Surgeon: Troy Sine, MD;  Location: [facility];  Service: Cardiovascular;  Laterality: N/A;  . POLYPECTOMY    . RIGHT/LEFT HEART CATH AND CORONARY ANGIOGRAPHY N/A 04/16/2019   Procedure: RIGHT/LEFT HEART CATH AND CORONARY ANGIOGRAPHY;  Surgeon: Belva Crome, MD;  Location: [facility];  Service: Cardiovascular;  Laterality: N/A;  .  SHOULDER SURGERY     Rt. Shoulder  . TEE WITHOUT CARDIOVERSION N/A 04/26/2019   Procedure: TRANSESOPHAGEAL ECHOCARDIOGRAM (TEE);  Surgeon: Gaye Pollack, MD;  Location: [facility];  Service: Open Heart Surgery;  Laterality: N/A;  . TONSILLECTOMY    . V TACH ABLATION N/A 06/22/2020   Procedure: V TACH ABLATION;  Surgeon: Vickie Epley, MD;  Location: [facility];  Service: Cardiovascular;  Laterality: N/A;   Current Outpatient Medications on File Prior to Visit  Medication Sig Dispense Refill  . acetaminophen (TYLENOL) 500 MG tablet Take 1,000 mg by mouth at bedtime as needed for mild pain.    Marland Kitchen aspirin EC 81 MG tablet Take 1 tablet (81 mg total) by mouth daily. 90 tablet 3  . carvedilol (COREG) 3.125 MG tablet  Take 3.125 mg by mouth 2 (two) times daily with a meal.    . citalopram (CELEXA) 10 MG tablet Take 1 tablet (10 mg total) by mouth daily. 90 tablet 2  . colestipol (COLESTID) 5 g packet Take 5 g by mouth daily.     . diclofenac Sodium (VOLTAREN) 1 % GEL Apply 2 g topically 4 (four) times daily. 100 g 0  . fexofenadine (ALLEGRA) 180 MG tablet Take 1 tablet (180 mg total) by mouth daily as needed. For allergies 90 tablet 0  . finasteride (PROSCAR) 5 MG tablet Take 1 tablet (5 mg total) by mouth daily. 90 tablet 3  . fluticasone (FLONASE) 50 MCG/ACT nasal spray SPRAY 2 SPRAYS INTO EACH NOSTRIL EVERY DAY 48 mL 2  . Hydrocortisone Acetate 2.5 % CREA Apply 1 application topically in the morning and at bedtime. 30 g 1  . JANUVIA 50 MG tablet TAKE 1 TABLET BY MOUTH EVERY DAY 30 tablet 5  . JARDIANCE 25 MG TABS tablet TAKE 25 MG (1 TABLET) BY MOUTH DAILY BEFORE BREAKFAST. STOP PIOGLITAZONE 90 tablet 1  . ketotifen (ZADITOR) 0.025 % ophthalmic solution Place 1 drop into both eyes daily as needed (For itching).    Marland Kitchen losartan (COZAAR) 25 MG tablet TAKE 1 TABLET BY MOUTH EVERY DAY 90 tablet 2  . loteprednol (LOTEMAX) 0.5 % ophthalmic suspension Place 1 drop into both eyes 4 (four) times daily as needed (itching). 5 mL 1  . nystatin cream (MYCOSTATIN) Apply 1 application topically 3 (three) times daily. 60 g 0  . Omega-3 Fatty Acids (FISH OIL) 1000 MG CPDR Take 1,000 mg by mouth daily.    . pantoprazole (PROTONIX) 40 MG tablet TAKE 1 TABLET BY MOUTH EVERY DAY IN THE MORNING 90 tablet 3  . predniSONE (DELTASONE) 20 MG tablet 3 tabs poqday 1-2, 2 tabs poqday 3-4, 1 tab poqday 5-6 12 tablet 0  . rosuvastatin (CRESTOR) 40 MG tablet TAKE 1 TABLET (40 MG TOTAL) BY MOUTH DAILY AT 6 PM. 90 tablet 3   No current facility-administered medications on file prior to visit.   Allergies  Allergen Reactions  . Doxazosin Mesylate Other (See Comments)    Drops in blood pressure   . Flomax [Tamsulosin Hcl] Itching and Other  (See Comments)    Drops in blood pressure    . Penicillins Rash and Other (See Comments)    Has patient had a PCN reaction causing immediate rash, facial/tongue/throat swelling, SOB or lightheadedness with hypotension: No Has patient had a PCN reaction causing severe rash involving mucus membranes or skin necrosis: No Has patient had a PCN reaction that required hospitalization No Has patient had a PCN reaction occurring within the  last 10 years: No If all of the above answers are "NO", then may proceed with Cephalosporin use.   . Sulfonamide Derivatives Rash  . Trulicity [Dulaglutide] Nausea And Vomiting    N&V, Dizziness   Social History   Socioeconomic History  . Marital status: Married    Spouse name: Not on file  . Number of children: 1  . Years of education: 38  . Highest education level: High school graduate  Occupational History  . 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 Smoking status: Former Smoker    Quit date: 08/30/1983    Years since quitting: 37.4  . Smokeless tobacco: Former Systems developer  . Tobacco comment: Quit 30 years ago.  Vaping Use  . Vaping Use: Never used  Substance and Sexual Activity  . Alcohol use: Yes    Comment: rare  . Drug use: No  . Sexual activity: Yes  Other Topics Concern  . Not on file  Social History Narrative  . Not on file   Social Determinants of Health   Financial Resource Strain: Not on file  Food Insecurity: Not on file  Transportation Needs: Not on file  Physical Activity: Not on file  Stress: Not on file  Social Connections: Not on file  Intimate Partner Violence: Not on file     Review of Systems  All other systems reviewed and are negative.      Objective:   Physical Exam Vitals reviewed.  Constitutional:      General: He is not in acute distress.    Appearance: He is obese. He is not ill-appearing or toxic-appearing.  HENT:     Nose: Congestion and rhinorrhea present.     Mouth/Throat:     Mouth: Mucous membranes are  moist.     Pharynx: No oropharyngeal exudate.  Cardiovascular:     Rate and Rhythm: Normal rate and regular rhythm.     Pulses: Normal pulses.     Heart sounds: Normal heart sounds. No murmur heard. No friction rub. No gallop.   Pulmonary:     Effort: Pulmonary effort is normal. No respiratory distress.     Breath sounds: Normal breath sounds. No stridor. No wheezing, rhonchi or rales.  Chest:     Chest wall: No tenderness.  Abdominal:     General: Abdomen is flat. Bowel sounds are normal. There is no distension.     Palpations: Abdomen is soft. There is no mass.     Tenderness: There is no abdominal tenderness. There is no right CVA tenderness, left CVA tenderness, guarding or rebound.     Hernia: No hernia is present.  Musculoskeletal:     Right lower leg: No edema.     Left lower leg: No edema.  Neurological:     Mental Status: He is alert.           Assessment & Plan:  Seasonal allergies  60 mg of Depo-Medrol IM x1.  Add Singulair 10 mg a day to Allegra and Flonase and see if the patient's allergies will be better controlled with this combination.  He is already using Patanol eyedrops.  I am perfectly fine with the patient switching from Januvia to [place].  I do not see any thrush in his mouth.  Instead I think the white drainage in the back of his throat is mucus not thrush and this is irritating his throat

## 2021-01-22 NOTE — Telephone Encounter (Signed)
Patient has appointment on 01/22/2021.  Will discuss at that time.

## 2021-02-08 ENCOUNTER — Telehealth: Payer: Self-pay | Admitting: Family Medicine

## 2021-02-08 NOTE — Telephone Encounter (Signed)
Call placed to patient.   States that co-pay for Januvia is $147.  Reports that Alogiliptin 25mg  is covered by [place].   Ok to change medication?

## 2021-02-08 NOTE — Telephone Encounter (Signed)
Pt called having a question about this med JANUVIA 50 MG tablet . Pt is wanting to know if he could get a prescription for Alogliptin in place of  Januvia. Please advise   Cb#: [phone]

## 2021-02-08 NOTE — Telephone Encounter (Signed)
Call placed to patient and patient made aware.  

## 2021-02-12 ENCOUNTER — Other Ambulatory Visit: Payer: Self-pay

## 2021-02-12 ENCOUNTER — Encounter: Payer: Self-pay | Admitting: Family Medicine

## 2021-02-12 ENCOUNTER — Ambulatory Visit (INDEPENDENT_AMBULATORY_CARE_PROVIDER_SITE_OTHER): Payer: Medicare HMO | Admitting: Family Medicine

## 2021-02-12 VITALS — BP 126/74 | HR 74 | Temp 98.1°F | Resp 16 | Ht 71.0 in | Wt 225.0 lb

## 2021-02-12 DIAGNOSIS — J302 Other seasonal allergic rhinitis: Secondary | ICD-10-CM

## 2021-02-12 MED ORDER — OLOPATADINE HCL 0.1 % OP SOLN: 1.0000 [drp] | Freq: Two times a day (BID) | OPHTHALMIC | 12 refills | Status: DC

## 2021-02-12 MED ORDER — NYSTATIN 100000 UNIT/ML MT SUSP: 5.0000 mL | Freq: Four times a day (QID) | OROMUCOSAL | 0 refills | Status: DC

## 2021-02-12 MED ORDER — AZELASTINE HCL 0.1 % NA SOLN: 2.0000 | Freq: Two times a day (BID) | NASAL | 12 refills | Status: DC

## 2021-02-12 NOTE — Progress Notes (Signed)
Subjective:    Patient ID: Cameron Daniel, male    DOB: [birth year], 74 y.o.   MRN: [id number] Patient states that he is having a terrible time with his allergies.  I saw him at the end of May and gave him steroids which she states helped temporarily however they have flared up now that he is back off of them.  He reports bilateral red itchy eyes.  He states that his eyes water.  He reports nasal congestion on a daily basis that makes it hard for him to breathe.  He reports postnasal drip.  He reports sneezing.  He reports nonproductive cough.  He denies any sinus pain or fevers.  He is currently on Allegra, Flonase, and Singulair.  He states that he is tried allergy shots in the past but he saw no benefit from them.  Past Medical History:  Diagnosis Date   Allergic rhinitis, cause unspecified    Allergy    Anxiety    Arthritis    Cancer ([place])    Carotid Doppler 04/2020   Carotid US 9/21: No evidence of ICA stenosis bilaterally; right vertebral artery stenosis   Cataract    Chronic systolic CHF (congestive heart failure) ([place])    Coronary atherosclerosis of unspecified type of vessel, native or graft    a. MI 1985 with unclear details. b. CABG 03/2019 with LIMA -> LAD, SVG -> OM, SVG to dRCA.   Depression    PTSD    Diverticulosis of colon (without mention of hemorrhage)    Esophageal reflux    Esophageal stricture    Essential hypertension    Former tobacco use    Hemorrhoids    Hyperlipidemia    Irritable bowel syndrome    Ischemic cardiomyopathy    Mild carotid artery disease ([place])    a. 1-39% bilaterally 03/2019 duplex.   Myocardial infarction (HCC)    Nocturia    Obesity, unspecified    Orthostasis    Other acquired absence of organ    Personal history of colonic polyps    Psychosexual dysfunction with inhibited sexual excitement    Retroperitoneal bleed    Type II or unspecified type diabetes mellitus without mention of complication, not stated as uncontrolled     Past Surgical History:  Procedure Laterality Date   ADENOIDECTOMY     CARPAL TUNNEL RELEASE     left    CHOLECYSTECTOMY     COLON RESECTION     18 inches   COLON SURGERY  2013   CORONARY ANGIOPLASTY     1985    CORONARY ARTERY BYPASS GRAFT N/A 04/26/2019   Procedure: CORONARY ARTERY BYPASS GRAFTING (CABG) x Three, using left internal mammary artery and right leg greater saphenous vein harvested endoscopically;  Surgeon: Gaye Pollack, MD;  Location: [facility];  Service: Open Heart Surgery;  Laterality: N/A;   ELBOW BURSA SURGERY     EYE SURGERY     ICD IMPLANT N/A 06/25/2020   Procedure: ICD IMPLANT;  Surgeon: Vickie Epley, MD;  Location: [facility];  Service: Cardiovascular;  Laterality: N/A;   LEFT HEART CATH AND CORS/GRAFTS ANGIOGRAPHY N/A 06/19/2020   Procedure: LEFT HEART CATH AND CORS/GRAFTS ANGIOGRAPHY;  Surgeon: Troy Sine, MD;  Location: [facility];  Service: Cardiovascular;  Laterality: N/A;   POLYPECTOMY     RIGHT/LEFT HEART CATH AND CORONARY ANGIOGRAPHY N/A 04/16/2019   Procedure: RIGHT/LEFT HEART CATH AND CORONARY ANGIOGRAPHY;  Surgeon: Belva Crome, MD;  Location: [facility];  Service: Cardiovascular;  Laterality: N/A;   SHOULDER SURGERY     Rt. Shoulder   TEE WITHOUT CARDIOVERSION N/A 04/26/2019   Procedure: TRANSESOPHAGEAL ECHOCARDIOGRAM (TEE);  Surgeon: Gaye Pollack, MD;  Location: [facility];  Service: Open Heart Surgery;  Laterality: N/A;   TONSILLECTOMY     V TACH ABLATION N/A 06/22/2020   Procedure: V TACH ABLATION;  Surgeon: Vickie Epley, MD;  Location: [facility];  Service: Cardiovascular;  Laterality: N/A;   Current Outpatient Medications on File Prior to Visit  Medication Sig Dispense Refill   acetaminophen (TYLENOL) 500 MG tablet Take 1,000 mg by mouth at bedtime as needed for mild pain.     aspirin EC 81 MG tablet Take 1 tablet (81 mg total) by mouth daily. 90 tablet 3   carvedilol (COREG) 3.125 MG tablet  Take 3.125 mg by mouth 2 (two) times daily with a meal.     citalopram (CELEXA) 10 MG tablet Take 1 tablet (10 mg total) by mouth daily. 90 tablet 2   colestipol (COLESTID) 5 g packet Take 5 g by mouth daily.      diclofenac Sodium (VOLTAREN) 1 % GEL Apply 2 g topically 4 (four) times daily. 100 g 0   fexofenadine (ALLEGRA) 180 MG tablet Take 1 tablet (180 mg total) by mouth daily as needed. For allergies 90 tablet 0   finasteride (PROSCAR) 5 MG tablet Take 1 tablet (5 mg total) by mouth daily. 90 tablet 3   fluticasone (FLONASE) 50 MCG/ACT nasal spray SPRAY 2 SPRAYS INTO EACH NOSTRIL EVERY DAY 48 mL 2   Hydrocortisone Acetate 2.5 % CREA Apply 1 application topically in the morning and at bedtime. 30 g 1   JANUVIA 50 MG tablet TAKE 1 TABLET BY MOUTH EVERY DAY 30 tablet 5   JARDIANCE 25 MG TABS tablet TAKE 25 MG (1 TABLET) BY MOUTH DAILY BEFORE BREAKFAST. STOP PIOGLITAZONE 90 tablet 1   ketotifen (ZADITOR) 0.025 % ophthalmic solution Place 1 drop into both eyes daily as needed (For itching).     losartan (COZAAR) 25 MG tablet TAKE 1 TABLET BY MOUTH EVERY DAY 90 tablet 2   loteprednol (LOTEMAX) 0.5 % ophthalmic suspension Place 1 drop into both eyes 4 (four) times daily as needed (itching). 5 mL 1   montelukast (SINGULAIR) 10 MG tablet Take 1 tablet (10 mg total) by mouth at bedtime. 30 tablet 3   nystatin cream (MYCOSTATIN) Apply 1 application topically 3 (three) times daily. 60 g 0   Omega-3 Fatty Acids (FISH OIL) 1000 MG CPDR Take 1,000 mg by mouth daily.     pantoprazole (PROTONIX) 40 MG tablet TAKE 1 TABLET BY MOUTH EVERY DAY IN THE MORNING 90 tablet 3   predniSONE (DELTASONE) 20 MG tablet 3 tabs poqday 1-2, 2 tabs poqday 3-4, 1 tab poqday 5-6 12 tablet 0   rosuvastatin (CRESTOR) 40 MG tablet TAKE 1 TABLET (40 MG TOTAL) BY MOUTH DAILY AT 6 PM. 90 tablet 3   No current facility-administered medications on file prior to visit.   Allergies  Allergen Reactions   Doxazosin Mesylate Other (See  Comments)    Drops in blood pressure    Flomax [Tamsulosin Hcl] Itching and Other (See Comments)    Drops in blood pressure     Penicillins Rash and Other (See Comments)    Has patient had a PCN reaction causing immediate rash, facial/tongue/throat swelling, SOB or lightheadedness with hypotension: No Has patient  had a PCN reaction causing severe rash involving mucus membranes or skin necrosis: No Has patient had a PCN reaction that required hospitalization No Has patient had a PCN reaction occurring within the last 10 years: No If all of the above answers are "NO", then may proceed with Cephalosporin use.    Sulfonamide Derivatives Rash   Trulicity [Dulaglutide] Nausea And Vomiting    N&V, Dizziness   Social History   Socioeconomic History   Marital status: Married    Spouse name: Not on file   Number of children: 1   Years of education: 12   Highest education level: High school graduate  Occupational History   Occupation: Lawns  Tobacco Use   Smoking status: Former    Pack years: 0.00    Types: Cigarettes    Quit date: 08/30/1983    Years since quitting: 37.4   Smokeless tobacco: Former   Tobacco comments:    Quit 30 years ago.  Vaping Use   Vaping Use: Never used  Substance and Sexual Activity   Alcohol use: Yes    Comment: rare   Drug use: No   Sexual activity: Yes  Other Topics Concern   Not on file  Social History Narrative   Not on file   Social Determinants of Health   Financial Resource Strain: Not on file  Food Insecurity: Not on file  Transportation Needs: Not on file  Physical Activity: Not on file  Stress: Not on file  Social Connections: Not on file  Intimate Partner Violence: Not on file     Review of Systems  All other systems reviewed and are negative.     Objective:   Physical Exam Vitals reviewed.  Constitutional:      General: He is not in acute distress.    Appearance: He is obese. He is not ill-appearing or toxic-appearing.   HENT:     Right Ear: Tympanic membrane and ear canal normal.     Left Ear: Tympanic membrane and ear canal normal.     Nose: Congestion and rhinorrhea present.     Mouth/Throat:     Mouth: Mucous membranes are moist.     Pharynx: No oropharyngeal exudate.  Eyes:     Conjunctiva/sclera:     Right eye: Right conjunctiva is injected. No exudate.    Left eye: Left conjunctiva is injected. No exudate. Cardiovascular:     Rate and Rhythm: Normal rate and regular rhythm.     Pulses: Normal pulses.     Heart sounds: Normal heart sounds. No murmur heard.   No friction rub. No gallop.  Pulmonary:     Effort: Pulmonary effort is normal. No respiratory distress.     Breath sounds: Normal breath sounds. No stridor. No wheezing, rhonchi or rales.  Chest:     Chest wall: No tenderness.  Abdominal:     General: Abdomen is flat. Bowel sounds are normal. There is no distension.     Palpations: Abdomen is soft. There is no mass.     Tenderness: There is no abdominal tenderness. There is no right CVA tenderness, left CVA tenderness, guarding or rebound.     Hernia: No hernia is present.  Musculoskeletal:     Right lower leg: No edema.     Left lower leg: No edema.  Neurological:     Mental Status: He is alert.          Assessment & Plan:  Seasonal allergies At this point, I am running  out of options.  I recommended that we add Astelin 2 sprays each nostril twice a day and Patanol 2 drops each eye once a day to his allergy regimen and then reassess in 2 weeks.

## 2021-02-17 DIAGNOSIS — M62830 Muscle spasm of back: Secondary | ICD-10-CM | POA: Diagnosis not present

## 2021-02-17 DIAGNOSIS — M9902 Segmental and somatic dysfunction of thoracic region: Secondary | ICD-10-CM | POA: Diagnosis not present

## 2021-02-17 DIAGNOSIS — M5033 Other cervical disc degeneration, cervicothoracic region: Secondary | ICD-10-CM | POA: Diagnosis not present

## 2021-02-17 DIAGNOSIS — M9901 Segmental and somatic dysfunction of cervical region: Secondary | ICD-10-CM | POA: Diagnosis not present

## 2021-02-18 DIAGNOSIS — M5033 Other cervical disc degeneration, cervicothoracic region: Secondary | ICD-10-CM | POA: Diagnosis not present

## 2021-02-18 DIAGNOSIS — M9901 Segmental and somatic dysfunction of cervical region: Secondary | ICD-10-CM | POA: Diagnosis not present

## 2021-02-18 DIAGNOSIS — M62830 Muscle spasm of back: Secondary | ICD-10-CM | POA: Diagnosis not present

## 2021-02-18 DIAGNOSIS — M9902 Segmental and somatic dysfunction of thoracic region: Secondary | ICD-10-CM | POA: Diagnosis not present

## 2021-02-19 ENCOUNTER — Telehealth: Payer: Self-pay | Admitting: *Deleted

## 2021-02-19 NOTE — Chronic Care Management (AMB) (Signed)
  Chronic Care Management   Note  02/19/2021 Name: JONTAVIUS RABALAIS MRN: [id number] DOB: 1945-09-22  DESHAN HEMMELGARN is a 75 y.o. year old male who is a primary care patient of [name], MD. I reached out to Thornton Papas by phone today in response to a referral sent by Mr. Giann Obara Farver's PCP, Dr. Dennard Schaumann.      Mr. Centrella was given information about Chronic Care Management services today including:  CCM service includes personalized support from designated clinical staff supervised by his physician, including individualized plan of care and coordination with other care providers 24/7 contact phone numbers for assistance for urgent and routine care needs. Service will only be billed when office clinical staff spend 20 minutes or more in a month to coordinate care. Only one practitioner may furnish and bill the service in a calendar month. The patient may stop CCM services at any time (effective at the end of the month) by phone call to the office staff. The patient will be responsible for cost sharing (co-pay) of up to 20% of the service fee (after annual deductible is met).  Patient agreed to services and verbal consent obtained.   Follow up plan: Telephone appointment with care management team member scheduled for: 02/22/2021  [facility]

## 2021-02-22 ENCOUNTER — Ambulatory Visit (INDEPENDENT_AMBULATORY_CARE_PROVIDER_SITE_OTHER): Payer: Medicare HMO | Admitting: *Deleted

## 2021-02-22 DIAGNOSIS — I5022 Chronic systolic (congestive) heart failure: Secondary | ICD-10-CM | POA: Diagnosis not present

## 2021-02-22 DIAGNOSIS — E1169 Type 2 diabetes mellitus with other specified complication: Secondary | ICD-10-CM

## 2021-02-22 DIAGNOSIS — E785 Hyperlipidemia, unspecified: Secondary | ICD-10-CM | POA: Diagnosis not present

## 2021-02-22 DIAGNOSIS — M9901 Segmental and somatic dysfunction of cervical region: Secondary | ICD-10-CM | POA: Diagnosis not present

## 2021-02-22 DIAGNOSIS — M5033 Other cervical disc degeneration, cervicothoracic region: Secondary | ICD-10-CM | POA: Diagnosis not present

## 2021-02-22 DIAGNOSIS — M62830 Muscle spasm of back: Secondary | ICD-10-CM | POA: Diagnosis not present

## 2021-02-22 DIAGNOSIS — M9902 Segmental and somatic dysfunction of thoracic region: Secondary | ICD-10-CM | POA: Diagnosis not present

## 2021-02-22 NOTE — Chronic Care Management (AMB) (Signed)
Chronic Care Management   CCM RN Visit Note  02/22/2021 Name: Cameron Daniel MRN: [id number] DOB: 04-Oct-1945  Subjective: Cameron Daniel is a 75 y.o. year old male who is a primary care patient of Pickard, [name], MD. The care management team was consulted for assistance with disease management and care coordination needs.    Engaged with patient by telephone for initial visit in response to provider referral for case management and/or care coordination services.   Consent to Services:  The patient was given the following information about Chronic Care Management services today, agreed to services, and gave verbal consent: 1. CCM service includes personalized support from designated clinical staff supervised by the primary care provider, including individualized plan of care and coordination with other care providers 2. 24/7 contact phone numbers for assistance for urgent and routine care needs. 3. Service will only be billed when office clinical staff spend 20 minutes or more in a month to coordinate care. 4. Only one practitioner may furnish and bill the service in a calendar month. 5.The patient may stop CCM services at any time (effective at the end of the month) by phone call to the office staff. 6. The patient will be responsible for cost sharing (co-pay) of up to 20% of the service fee (after annual deductible is met). Patient agreed to services and consent obtained.  Patient agreed to services and verbal consent obtained.   Assessment: Review of patient past medical history, allergies, medications, health status, including review of consultants reports, laboratory and other test data, was performed as part of comprehensive evaluation and provision of chronic care management services.   SDOH (Social Determinants of Health) assessments and interventions performed:  SDOH Interventions    Flowsheet Row Most Recent Value  SDOH Interventions   Food Insecurity Interventions  Intervention Not Indicated  Social Connections Interventions Intervention Not Indicated  Transportation Interventions Intervention Not Indicated        CCM Care Plan  Allergies  Allergen Reactions   Doxazosin Mesylate Other (See Comments)    Drops in blood pressure    Flomax [Tamsulosin Hcl] Itching and Other (See Comments)    Drops in blood pressure     Penicillins Rash and Other (See Comments)    Has patient had a PCN reaction causing immediate rash, facial/tongue/throat swelling, SOB or lightheadedness with hypotension: No Has patient had a PCN reaction causing severe rash involving mucus membranes or skin necrosis: No Has patient had a PCN reaction that required hospitalization No Has patient had a PCN reaction occurring within the last 10 years: No If all of the above answers are "NO", then may proceed with Cephalosporin use.    Sulfonamide Derivatives Rash   Trulicity [Dulaglutide] Nausea And Vomiting    N&V, Dizziness    Outpatient Encounter Medications as of 02/22/2021  Medication Sig   acetaminophen (TYLENOL) 500 MG tablet Take 1,000 mg by mouth at bedtime as needed for mild pain.   aspirin EC 81 MG tablet Take 1 tablet (81 mg total) by mouth daily.   azelastine (ASTELIN) 0.1 % nasal spray Place 2 sprays into both nostrils 2 (two) times daily. Use in each nostril as directed   carvedilol (COREG) 3.125 MG tablet Take 3.125 mg by mouth 2 (two) times daily with a meal.   citalopram (CELEXA) 10 MG tablet Take 1 tablet (10 mg total) by mouth daily.   colestipol (COLESTID) 5 g packet Take 5 g by mouth daily.    diclofenac Sodium (VOLTAREN)  1 % GEL Apply 2 g topically 4 (four) times daily.   fexofenadine (ALLEGRA) 180 MG tablet Take 1 tablet (180 mg total) by mouth daily as needed. For allergies   finasteride (PROSCAR) 5 MG tablet Take 1 tablet (5 mg total) by mouth daily.   fluticasone (FLONASE) 50 MCG/ACT nasal spray SPRAY 2 SPRAYS INTO EACH NOSTRIL EVERY DAY    Hydrocortisone Acetate 2.5 % CREA Apply 1 application topically in the morning and at bedtime.   JANUVIA 50 MG tablet TAKE 1 TABLET BY MOUTH EVERY DAY   JARDIANCE 25 MG TABS tablet TAKE 25 MG (1 TABLET) BY MOUTH DAILY BEFORE BREAKFAST. STOP PIOGLITAZONE   ketotifen (ZADITOR) 0.025 % ophthalmic solution Place 1 drop into both eyes daily as needed (For itching).   losartan (COZAAR) 25 MG tablet TAKE 1 TABLET BY MOUTH EVERY DAY   loteprednol (LOTEMAX) 0.5 % ophthalmic suspension Place 1 drop into both eyes 4 (four) times daily as needed (itching).   montelukast (SINGULAIR) 10 MG tablet Take 1 tablet (10 mg total) by mouth at bedtime.   olopatadine (PATANOL) 0.1 % ophthalmic solution Place 1 drop into both eyes 2 (two) times daily.   Omega-3 Fatty Acids (FISH OIL) 1000 MG CPDR Take 1,000 mg by mouth daily.   pantoprazole (PROTONIX) 40 MG tablet TAKE 1 TABLET BY MOUTH EVERY DAY IN THE MORNING   rosuvastatin (CRESTOR) 40 MG tablet TAKE 1 TABLET (40 MG TOTAL) BY MOUTH DAILY AT 6 PM.   nystatin (MYCOSTATIN) 100000 UNIT/ML suspension Take 5 mLs (500,000 Units total) by mouth 4 (four) times daily. (Patient not taking: Reported on 02/22/2021)   nystatin cream (MYCOSTATIN) Apply 1 application topically 3 (three) times daily. (Patient not taking: Reported on 02/22/2021)   predniSONE (DELTASONE) 20 MG tablet 3 tabs poqday 1-2, 2 tabs poqday 3-4, 1 tab poqday 5-6 (Patient not taking: Reported on 02/22/2021)   No facility-administered encounter medications on file as of 02/22/2021.    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   Hyponatremia 04/20/2019   Retroperitoneal hematoma 04/18/2019   Acute on chronic blood loss anemia 99/35/7017    Acute systolic HF (heart failure) ([place]) 04/15/2019   Non-ST elevation (NSTEMI) myocardial infarction ([place]) 04/15/2019   AKI (acute kidney injury) ([place])    Community acquired pneumonia of right lower lobe of lung 04/12/2019   Acute respiratory failure ([place]) 04/12/2019   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   Knee pain 06/07/2018   Allergic conjunctivitis 01/23/2018   Chronic sinusitis 01/23/2018   Personal history of other malignant neoplasm of skin 12/18/2014   Gout 04/06/2012   DIVERTICULITIS-COLON 06/18/2010   FACIAL FLUSHING 12/11/2007   NOCTURIA 12/11/2007   Type 2 diabetes mellitus with hyperlipidemia ([place]) 09/07/2007   OTHER MALAISE AND FATIGUE 09/07/2007   Allergic rhinitis 08/16/2007   HLD (hyperlipidemia) 01/24/2007   OBESITY 01/24/2007   ERECTILE DYSFUNCTION 01/24/2007   HYPERTENSION 01/24/2007   Coronary artery disease involving native coronary artery of native heart with angina pectoris ([place]) 01/24/2007   GERD 01/24/2007   DIVERTICULOSIS, COLON 01/24/2007   IRRITABLE BOWEL SYNDROME 01/24/2007   COLONIC POLYPS, HX OF 01/24/2007   CHOLECYSTECTOMY, HX OF 01/24/2007    Conditions to be addressed/monitored:CHF and  DMII  Care Plan : Diabetes Type 2 (Adult)  Updates made by Kassie Mends, RN since 02/22/2021 12:00 AM     Problem: Glycemic Management (Diabetes, Type 2)   Priority: High     Long-Range Goal: Glycemic Management Optimized   Start Date: 02/22/2021  Expected End Date: 08/24/2021  This Visit's Progress: On track  Priority: High  Note:   Objective:  Lab Results  Component Value Date   HGBA1C 7.0 (H) 01/15/2021   Lab Results  Component Value Date   CREATININE 0.84 01/15/2021   CREATININE 0.83 07/17/2020   CREATININE 1.16 06/24/2020   No results found for: EGFR Current Barriers:  Knowledge Deficits related to basic Diabetes pathophysiology and self care/management- does not check CBG daily, checks 2-3 times per week, does not  always adhere to ADA/ carbohydrate modified diet (sometimes eats fast food and drinks sugary soft drinks 1 per day), reports does walk and goes fishing, is trying to remain active, 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 Difficulty obtaining or cannot afford medications- reports Celesta Gentile is $141.00/ per month and jardiance is approximately $160.00 / per month, requests pharmacy assistance. Case Manager Clinical Goal(s):  patient will demonstrate improved adherence to prescribed treatment plan for diabetes self care/management as evidenced by: adherence to ADA/ carb modified diet adherence to prescribed medication regimen contacting provider for new or worsened symptoms or questions Interventions:  Collaboration with [name], MD regarding development and update of comprehensive plan of care as evidenced by provider attestation and co-signature Inter-disciplinary care team collaboration (see longitudinal plan of care) Provided education to patient about basic DM disease process Reviewed medications with patient and discussed importance of medication adherence Discussed plans with patient for ongoing care management follow up and provided patient with direct contact information for care management team Provided patient with written educational materials related to hypo and hyperglycemia and importance of correct treatment Reviewed scheduled/upcoming provider appointments including: Dr. Loanne Drilling 03/10/21, Dr. Johney Frame 03/19/21. Referral made to pharmacy team for assistance with assistance with costs of [place] and jardiance Review of patient status, including review of consultants reports, relevant laboratory and other test results, and medications completed. Reinforced ADA/ carbohydrate modified diet and plate method. Reviewed importance of checking CBG daily Self-Care Activities - Attends all scheduled provider appointments Checks blood sugars as prescribed and utilize hyper and  hypoglycemia protocol as needed Adheres to prescribed ADA/carb modified Patient Goals: - fill half of plate with vegetables - manage portion size - read food labels for fat, fiber, carbohydrates and portion size - check feet daily for cuts, sores or redness - wear comfortable, cotton socks - wear comfortable, well-fitting shoes- check blood sugar at prescribed times - check blood sugar if I feel it is too high or too low - take the blood sugar log to all doctor visits - take the blood sugar meter to all doctor visits  - practice portion control - be mindful of foods high in carbohydrates such as pasta, rice, bread, etc will elevate blood sugar - read over diabetes education mailed to you - exercise is helpful- continue fishing and walking Follow Up Plan: Telephone follow up appointment with care management team member scheduled for: 03/22/2021    Care Plan : Heart Failure (Adult)  Updates made by Kassie Mends, RN since 02/22/2021 12:00 AM     Problem: Symptom Exacerbation (Heart Failure)   Priority: High     Long-Range Goal: Symptom Exacerbation Prevented or Minimized   Start Date: 02/22/2021  Expected End Date: 08/24/2021  This Visit's Progress: On track  Priority: High  Note:   Current Barriers:  Knowledge deficit related to basic heart failure pathophysiology and self care management- needs reinforcement of HF action plan, importance of symptom management Does not adhere to provider recommendations re:  does not always eat low sodium diet, eats fast food several times per week Pt reports he has scales and weighs daily, gets outside to exercise (likes to walk and fish) Lives with spouse and reports independent in all aspects of his care 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 unsure about living will, requests information be mailed Case Manager Clinical Goal(s):  patient will verbalize understanding of Heart Failure Action Plan and when to call doctor patient will take all Heart Failure  mediations as prescribed patient will weigh daily and record (notifying MD of 3 lb weight gain over night or 5 lb in a week) Interventions:  Collaboration with [name], MD regarding development and update of comprehensive plan of care as evidenced by provider attestation and co-signature Inter-disciplinary care team collaboration (see longitudinal plan of care) Basic overview and discussion of pathophysiology of Heart Failure reviewed  Provided verbal education on low sodium diet Reviewed Heart Failure Action Plan in depth and provided written copy Assessed need for readable accurate scales in home Provided education about placing scale on hard, flat surface Advised patient to weigh each morning after emptying bladder Discussed importance of daily weight and advised patient to weigh and record daily Reviewed all upcoming doctor's appointments Mailed packet to complete Living Will Self-Care Activities: Attends all scheduled provider appointments Calls pharmacy for medication refills Calls provider office for new concerns or questions Patient Goals: - make an activity or exercise plan - watch for swelling in feet, ankles and legs every day- develop a rescue plan- look over action plan mailed to your home - eat more whole grains, fruits and vegetables, lean meats and healthy fats - follow rescue plan if symptoms flare-up - know when to call the doctor - track symptoms and what helps feel better or worse  - follow a low salt diet - continue to weigh daily- call doctor for weight gain of 3 pounds overnight or 5 pounds in one week Follow Up Plan: Telephone follow up appointment with care management team member scheduled for:  03/22/2021     Plan:Telephone follow up appointment with care management team member scheduled for:  03/22/2021  Jacqlyn [facility], BSN RN Case Manager [facility] [phone]

## 2021-02-22 NOTE — Patient Instructions (Signed)
Visit Information   PATIENT GOALS:   Goals Addressed             This Visit's Progress    Monitor and Manage My Blood Sugar-Diabetes Type 2       Timeframe:  Long-Range Goal Priority:  High Start Date:    02/22/2021                         Expected End Date:    08/24/2021                   Follow Up Date - 03/22/2021    - check blood sugar at prescribed times - check blood sugar if I feel it is too high or too low - take the blood sugar log to all doctor visits - take the blood sugar meter to all doctor visits  - practice portion control - be mindful of foods high in carbohydrates such as pasta, rice, bread, etc will elevate blood sugar - read over diabetes education mailed to you - exercise is helpful- continue fishing and walking   Why is this important?   Checking your blood sugar at home helps to keep it from getting very high or very low.  Writing the results in a diary or log helps the doctor know how to care for you.  Your blood sugar log should have the time, date and the results.  Also, write down the amount of insulin or other medicine that you take.  Other information, like what you ate, exercise done and how you were feeling, will also be helpful.     Notes:       Track and Manage Symptoms-Heart Failure       Timeframe:  Long-Range Goal Priority:  High Start Date:       02/22/2021                      Expected End Date:    08/24/2021                   Follow Up Date - 03/22/2021   - develop a rescue plan- look over action plan mailed to your home - eat more whole grains, fruits and vegetables, lean meats and healthy fats - follow rescue plan if symptoms flare-up - know when to call the doctor - track symptoms and what helps feel better or worse  - follow a low salt diet - continue to weigh daily- call doctor for weight gain of 3 pounds overnight or 5 pounds in one week   Why is this important?   You will be able to handle your symptoms better if you keep  track of them.  Making some simple changes to your lifestyle will help.  Eating healthy is one thing you can do to take good care of yourself.    Notes:          Consent to CCM Services: Cameron Daniel was given information about Chronic Care Management services today including:  CCM service includes personalized support from designated clinical staff supervised by his physician, including individualized plan of care and coordination with other care providers 24/7 contact phone numbers for assistance for urgent and routine care needs. Service will only be billed when office clinical staff spend 20 minutes or more in a month to coordinate care. Only one practitioner may furnish and bill the service in a calendar month. The patient may stop  CCM services at any time (effective at the end of the month) by phone call to the office staff. The patient will be responsible for cost sharing (co-pay) of up to 20% of the service fee (after annual deductible is met).  Patient agreed to services and verbal consent obtained.   The patient verbalized understanding of instructions, educational materials, and care plan provided today and agreed to receive a mailed copy of patient instructions, educational materials, and care plan. Heart Failure Action Plan A heart failure action plan helps you understand what to do when you have symptoms of heart failure. Your action plan is a color-coded plan that lists the symptoms to watch for and indicates what actions to take. If you have symptoms in the red zone, you need medical care right away. If you have symptoms in the yellow zone, you are having problems. If you have symptoms in the green zone, you are doing well. Follow the plan that was created by you and your health care provider. Reviewyour plan each time you visit your health care provider. Red zone These signs and symptoms mean you should get medical help right away: You have trouble breathing when  resting. You have a dry cough that is getting worse. You have swelling or pain in your legs or abdomen that is getting worse. You suddenly gain more than 2-3 lb (0.9-1.4 kg) in 24 hours, or more than 5 lb (2.3 kg) in a week. This amount may be more or less depending on your condition. You have trouble staying awake or you feel confused. You have chest pain. You do not have an appetite. You pass out. You have worsening sadness or depression. If you have any of these symptoms, call your local emergency services (911 in the [place] right away. Do not drive yourself to the hospital. Yellow zone These signs and symptoms mean your condition may be getting worse and you should make some changes: You have trouble breathing when you are active, or you need to sleep with your head raised on extra pillows to help you breathe. You have swelling in your legs or abdomen. You gain 2-3 lb (0.9-1.4 kg) in 24 hours, or 5 lb (2.3 kg) in a week. This amount may be more or less depending on your condition. You get tired easily. You have trouble sleeping. You have a dry cough. If you have any of these symptoms: Contact your health care provider within the next day. Your health care provider may adjust your medicines. Green zone These signs mean you are doing well and can continue what you are doing: You do not have shortness of breath. You have very little swelling or no new swelling. Your weight is stable (no gain or loss). You have a normal activity level. You do not have chest pain or any other new symptoms. Follow these instructions at home: Take over-the-counter and prescription medicines only as told by your health care provider. Weigh yourself daily. Your target weight is __________ lb (__________ kg). Call your health care provider if you gain more than __________ lb (__________ kg) in 24 hours, or more than __________ lb (__________ kg) in a week. Health care provider name:  _____________________________________________________ Health care provider phone number: _____________________________________________________ Eat a heart-healthy diet. Work with a diet and nutrition specialist (dietitian) to create an eating plan that is best for you. Keep all follow-up visits. This is important. Where to find more information American Heart Association: www.heart.org Summary A heart failure action plan helps you  understand what to do when you have symptoms of heart failure. Follow the action plan that was created by you and your health care provider. Get help right away if you have any symptoms in the red zone. This information is not intended to replace advice given to you by your health care provider. Make sure you discuss any questions you have with your healthcare provider. Document Revised: 03/30/2020 Document Reviewed: 03/30/2020 Elsevier Patient Education  2022 [place]. Diabetes Mellitus and Nutrition, Adult When you have diabetes, or diabetes mellitus, it is very important to have healthy eating habits because your blood sugar (glucose) levels are greatly affected by what you eat and drink. Eating healthy foods in the right amounts, at about the same times every day, can help you: Control your blood glucose. Lower your risk of heart disease. Improve your blood pressure. Reach or maintain a healthy weight. What can affect my meal plan? Every person with diabetes is different, and each person has different needs for a meal plan. Your health care provider may recommend that you work with a dietitian to make a meal plan that is best for you. Your meal plan may vary depending on factors such as: The calories you need. The medicines you take. Your weight. Your blood glucose, blood pressure, and cholesterol levels. Your activity level. Other health conditions you have, such as heart or kidney disease. How do carbohydrates affect me? Carbohydrates, also called  carbs, affect your blood glucose level more than any other type of food. Eating carbs naturally raises the amount of glucose in your blood. Carb counting is a method for keeping track of how many carbs you eat. Counting carbs is important to keep your blood glucose at a healthy level,especially if you use insulin or take certain oral diabetes medicines. It is important to know how many carbs you can safely have in each meal. This is different for every person. Your dietitian can help you calculate how manycarbs you should have at each meal and for each snack. How does alcohol affect me? Alcohol can cause a sudden decrease in blood glucose (hypoglycemia), especially if you use insulin or take certain oral diabetes medicines. Hypoglycemia can be a life-threatening condition. Symptoms of hypoglycemia, such as sleepiness, dizziness, and confusion, are similar to symptoms of having too much alcohol. Do not drink alcohol if: Your health care provider tells you not to drink. You are pregnant, may be pregnant, or are planning to become pregnant. If you drink alcohol: Do not drink on an empty stomach. Limit how much you use to: 0-1 drink a day for women. 0-2 drinks a day for men. Be aware of how much alcohol is in your drink. In the [place] one drink equals one 12 oz bottle of beer (355 mL), one 5 oz glass of wine (148 mL), or one 1 oz glass of hard liquor (44 mL). Keep yourself hydrated with water, diet soda, or unsweetened iced tea. Keep in mind that regular soda, juice, and other mixers may contain a lot of sugar and must be counted as carbs. What are tips for following this plan?  Reading food labels Start by checking the serving size on the "Nutrition Facts" label of packaged foods and drinks. The amount of calories, carbs, fats, and other nutrients listed on the label is based on one serving of the item. Many items contain more than one serving per package. Check the total grams (g) of carbs in one  serving. You can calculate the number  of servings of carbs in one serving by dividing the total carbs by 15. For example, if a food has 30 g of total carbs per serving, it would be equal to 2 servings of carbs. Check the number of grams (g) of saturated fats and trans fats in one serving. Choose foods that have a low amount or none of these fats. Check the number of milligrams (mg) of salt (sodium) in one serving. Most people should limit total sodium intake to less than 2,300 mg per day. Always check the nutrition information of foods labeled as "low-fat" or "nonfat." These foods may be higher in added sugar or refined carbs and should be avoided. Talk to your dietitian to identify your daily goals for nutrients listed on the label. Shopping Avoid buying canned, pre-made, or processed foods. These foods tend to be high in fat, sodium, and added sugar. Shop around the outside edge of the grocery store. This is where you will most often find fresh fruits and vegetables, bulk grains, fresh meats, and fresh dairy. Cooking Use low-heat cooking methods, such as baking, instead of high-heat cooking methods like deep frying. Cook using healthy oils, such as olive, canola, or sunflower oil. Avoid cooking with butter, cream, or high-fat meats. Meal planning Eat meals and snacks regularly, preferably at the same times every day. Avoid going long periods of time without eating. Eat foods that are high in fiber, such as fresh fruits, vegetables, beans, and whole grains. Talk with your dietitian about how many servings of carbs you can eat at each meal. Eat 4-6 oz (112-168 g) of lean protein each day, such as lean meat, chicken, fish, eggs, or tofu. One ounce (oz) of lean protein is equal to: 1 oz (28 g) of meat, chicken, or fish. 1 egg.  cup (62 g) of tofu. Eat some foods each day that contain healthy fats, such as avocado, nuts, seeds, and fish. What foods should I eat? Fruits Berries. Apples. Oranges.  Peaches. Apricots. Plums. Grapes. Mango. Papaya.Pomegranate. Kiwi. Cherries. Vegetables Lettuce. Spinach. Leafy greens, including kale, chard, collard greens, and mustard greens. Beets. Cauliflower. Cabbage. Broccoli. Carrots. Green beans.Tomatoes. Peppers. Onions. Cucumbers. Brussels sprouts. Grains Whole grains, such as whole-wheat or whole-grain bread, crackers, tortillas,cereal, and pasta. Unsweetened oatmeal. Quinoa. Brown or wild rice. Meats and other proteins Seafood. Poultry without skin. Lean cuts of poultry and beef. Tofu. Nuts. Seeds. Dairy Low-fat or fat-free dairy products such as milk, yogurt, and cheese. The items listed above may not be a complete list of foods and beverages you can eat. Contact a dietitian for more information. What foods should I avoid? Fruits Fruits canned with syrup. Vegetables Canned vegetables. Frozen vegetables with butter or cream sauce. Grains Refined white flour and flour products such as bread, pasta, snack foods, andcereals. Avoid all processed foods. Meats and other proteins Fatty cuts of meat. Poultry with skin. Breaded or fried meats. Processed meat.Avoid saturated fats. Dairy Full-fat yogurt, cheese, or milk. Beverages Sweetened drinks, such as soda or iced tea. The items listed above may not be a complete list of foods and beverages you should avoid. Contact a dietitian for more information. Questions to ask a health care provider Do I need to meet with a diabetes educator? Do I need to meet with a dietitian? What number can I call if I have questions? When are the best times to check my blood glucose? Where to find more information: American Diabetes Association: diabetes.org Academy of Nutrition and Dietetics: www.eatright.[employer] of  Diabetes and Digestive and Kidney Diseases: [place] Association of Diabetes Care and Education Specialists: www.diabeteseducator.org Summary It is important to have healthy  eating habits because your blood sugar (glucose) levels are greatly affected by what you eat and drink. A healthy meal plan will help you control your blood glucose and maintain a healthy lifestyle. Your health care provider may recommend that you work with a dietitian to make a meal plan that is best for you. Keep in mind that carbohydrates (carbs) and alcohol have immediate effects on your blood glucose levels. It is important to count carbs and to use alcohol carefully. This information is not intended to replace advice given to you by your health care provider. Make sure you discuss any questions you have with your healthcare provider. Document Revised: 07/23/2019 Document Reviewed: 07/23/2019 Elsevier Patient Education  2021 [place]. Hypoglycemia Hypoglycemia is when the sugar (glucose) level in your blood is too low. Low blood sugar can happen to people who have diabetes and people who do not have diabetes. Low blood sugar can happenquickly, and it can be an emergency. What are the causes? This condition happens most often in people who have diabetes. It may be caused by: Diabetes medicine. Not eating enough, or not eating often enough. Doing more physical activity. Drinking alcohol on an empty stomach. If you do not have diabetes, this condition may be caused by: A tumor in the pancreas. Not eating enough, or not eating for long periods at a time (fasting). A very bad infection or illness. Problems after having weight loss (bariatric) surgery. Kidney failure or liver failure. Certain medicines. What increases the risk? This condition is more likely to develop in people who: Have diabetes and take medicines to lower their blood sugar. Abuse alcohol. Have a very bad illness. What are the signs or symptoms? Mild Hunger. Sweating and feeling clammy. Feeling dizzy or light-headed. Being sleepy or having trouble sleeping. Feeling like you may vomit (nauseous). A fast  heartbeat. A headache. Blurry vision. Mood changes, such as: Being grouchy. Feeling worried or nervous (anxious). Tingling or loss of feeling (numbness) around your mouth, lips, or tongue. Moderate Confusion and poor judgment. Behavior changes. Weakness. Uneven heartbeat. Trouble with moving (coordination). Very low Very low blood sugar (severe hypoglycemia) is a medical emergency. It can cause: Fainting. Seizures. Loss of consciousness (coma). Death. How is this treated? Treating low blood sugar Low blood sugar is often treated by eating or drinking something that has sugar in it right away. The food or drink should contain 15 grams of a fast-acting carb (carbohydrate). Options include: 4 oz (120 mL) of fruit juice. 4 oz (120 mL) of regular soda (not diet soda). A few pieces of hard candy. Check food labels to see how many pieces to eat for 15 grams. 1 Tbsp (15 mL) of sugar or honey. 4 glucose tablets. 1 tube of glucose gel. Treating low blood sugar if you have diabetes If you can think clearly and swallow safely, follow the 15:15 rule: Take 15 grams of a fast-acting carb. Talk with your doctor about how much you should take. Always keep a source of fast-acting carb with you, such as: Glucose tablets (take 4 tablets). A few pieces of hard candy. Check food labels to see how many pieces to eat for 15 grams. 4 oz (120 mL) of fruit juice. 4 oz (120 mL) of regular soda (not diet soda). 1 Tbsp (15 mL) of honey or sugar. 1 tube of glucose gel. Check your  blood sugar 15 minutes after you take the carb. If your blood sugar is still at or below 70 mg/dL (3.9 mmol/L), take 15 grams of a carb again. If your blood sugar does not go above 70 mg/dL (3.9 mmol/L) after 3 tries, get help right away. After your blood sugar goes back to normal, eat a meal or a snack within 1 hour.  Treating very low blood sugar If your blood sugar is below 54 mg/dL (3 mmol/L), you have very low blood sugar,  or severe hypoglycemia. This is an emergency. Get medical help right away. If you have very low blood sugar and you cannot eat or drink, you will need to be given a hormone called glucagon. A family member or friend should learn how to check your blood sugar and how to give you glucagon. Ask your doctor if youneed to have an emergency glucagon kit at home. Very low blood sugar may also need to be treated in a hospital. Follow these instructions at home: General instructions Take over-the-counter and prescription medicines only as told by your doctor. Stay aware of your blood sugar as told by your doctor. If you drink alcohol: Limit how much you have to: 0-1 drink a day for women who are not pregnant. 0-2 drinks a day for men. Know how much alcohol is in your drink. In the [place] one drink equals one 12 oz bottle of beer (355 mL), one 5 oz glass of wine (148 mL), or one 1 oz glass of hard liquor (44 mL). Be sure to eat food when you drink alcohol. Know that your body absorbs alcohol quickly. This may lead to low blood sugar later. Be sure to keep checking your blood sugar. Keep all follow-up visits. If you have diabetes:  Always have a fast-acting carb (15 grams) with you to treat low blood sugar. Follow your diabetes care plan as told by your doctor. Make sure you: Know the symptoms of low blood sugar. Check your blood sugar as often as told. Always check it before and after exercise. Always check your blood sugar before you drive. Take your medicines as told. Follow your meal plan. Eat on time. Do not skip meals. Share your diabetes care plan with: Your work or school. People you live with. Carry a card or wear jewelry that says you have diabetes.  Where to find more information American Diabetes Association: www.diabetes.org Contact a doctor if: You have trouble keeping your blood sugar in your target range. You have low blood sugar often. Get help right away if: You still have  symptoms after you eat or drink something that contains 15 grams of fast-acting carb, and you cannot get your blood sugar above 70 mg/dL by following the 15:15 rule. Your blood sugar is below 54 mg/dL (3 mmol/L). You have a seizure. You faint. These symptoms may be an emergency. Get help right away. Call your local emergency services (911 in the [place] Do not wait to see if the symptoms will go away. Do not drive yourself to the hospital. Summary Hypoglycemia happens when the level of sugar (glucose) in your blood is too low. Low blood sugar can happen to people who have diabetes and people who do not have diabetes. Low blood sugar can happen quickly, and it can be an emergency. Make sure you know the symptoms of low blood sugar and know how to treat it. Always keep a source of sugar (fast-acting carb) with you to treat low blood sugar. This information  is not intended to replace advice given to you by your health care provider. Make sure you discuss any questions you have with your healthcare provider. Document Revised: 07/16/2020 Document Reviewed: 07/16/2020 Elsevier Patient Education  2022 [place].   Telephone follow up appointment with care management team member scheduled for:  Cameron Daniel [facility], BSN RN Case Manager [facility] [phone]   CLINICAL CARE PLAN: Patient Care Plan: Diabetes Type 2 (Adult)     Problem Identified: Glycemic Management (Diabetes, Type 2)   Priority: High     Long-Range Goal: Glycemic Management Optimized   Start Date: 02/22/2021  Expected End Date: 08/24/2021  This Visit's Progress: On track  Priority: High  Note:   Objective:  Lab Results  Component Value Date   HGBA1C 7.0 (H) 01/15/2021   Lab Results  Component Value Date   CREATININE 0.84 01/15/2021   CREATININE 0.83 07/17/2020   CREATININE 1.16 06/24/2020   No results found for: EGFR Current Barriers:  Knowledge Deficits related to basic Diabetes  pathophysiology and self care/management- does not check CBG daily, checks 2-3 times per week, does not always adhere to ADA/ carbohydrate modified diet (sometimes eats fast food and drinks sugary soft drinks 1 per day), reports does walk and goes fishing, is trying to remain active, 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 Difficulty obtaining or cannot afford medications- reports Celesta Gentile is $141.00/ per month and jardiance is approximately $160.00 / per month, requests pharmacy assistance. Case Manager Clinical Goal(s):  patient will demonstrate improved adherence to prescribed treatment plan for diabetes self care/management as evidenced by: adherence to ADA/ carb modified diet adherence to prescribed medication regimen contacting provider for new or worsened symptoms or questions Interventions:  Collaboration with [name], MD regarding development and update of comprehensive plan of care as evidenced by provider attestation and co-signature Inter-disciplinary care team collaboration (see longitudinal plan of care) Provided education to patient about basic DM disease process Reviewed medications with patient and discussed importance of medication adherence Discussed plans with patient for ongoing care management follow up and provided patient with direct contact information for care management team Provided patient with written educational materials related to hypo and hyperglycemia and importance of correct treatment Reviewed scheduled/upcoming provider appointments including: Dr. Loanne Drilling 03/10/21, Dr. Johney Frame 03/19/21. Referral made to pharmacy team for assistance with assistance with costs of [place] and jardiance Review of patient status, including review of consultants reports, relevant laboratory and other test results, and medications completed. Reinforced ADA/ carbohydrate modified diet and plate method. Reviewed importance of checking CBG daily Self-Care Activities -  Attends all scheduled provider appointments Checks blood sugars as prescribed and utilize hyper and hypoglycemia protocol as needed Adheres to prescribed ADA/carb modified Patient Goals: - fill half of plate with vegetables - manage portion size - read food labels for fat, fiber, carbohydrates and portion size - check feet daily for cuts, sores or redness - wear comfortable, cotton socks - wear comfortable, well-fitting shoes- check blood sugar at prescribed times - check blood sugar if I feel it is too high or too low - take the blood sugar log to all doctor visits - take the blood sugar meter to all doctor visits  - practice portion control - be mindful of foods high in carbohydrates such as pasta, rice, bread, etc will elevate blood sugar - read over diabetes education mailed to you - exercise is helpful- continue fishing and walking Follow Up Plan: Telephone follow up appointment with  care management team member scheduled for: 03/22/2021    Patient Care Plan: Heart Failure (Adult)     Problem Identified: Symptom Exacerbation (Heart Failure)   Priority: High     Long-Range Goal: Symptom Exacerbation Prevented or Minimized   Start Date: 02/22/2021  Expected End Date: 08/24/2021  This Visit's Progress: On track  Priority: High  Note:   Current Barriers:  Knowledge deficit related to basic heart failure pathophysiology and self care management- needs reinforcement of HF action plan, importance of symptom management Does not adhere to provider recommendations re:  does not always eat low sodium diet, eats fast food several times per week Pt reports he has scales and weighs daily, gets outside to exercise (likes to walk and fish) Lives with spouse and reports independent in all aspects of his care 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 unsure about living will, requests information be mailed Case Manager Clinical Goal(s):  patient will verbalize understanding of Heart Failure Action Plan and when  to call doctor patient will take all Heart Failure mediations as prescribed patient will weigh daily and record (notifying MD of 3 lb weight gain over night or 5 lb in a week) Interventions:  Collaboration with [name], MD regarding development and update of comprehensive plan of care as evidenced by provider attestation and co-signature Inter-disciplinary care team collaboration (see longitudinal plan of care) Basic overview and discussion of pathophysiology of Heart Failure reviewed  Provided verbal education on low sodium diet Reviewed Heart Failure Action Plan in depth and provided written copy Assessed need for readable accurate scales in home Provided education about placing scale on hard, flat surface Advised patient to weigh each morning after emptying bladder Discussed importance of daily weight and advised patient to weigh and record daily Reviewed all upcoming doctor's appointments Mailed packet to complete Living Will Self-Care Activities: Attends all scheduled provider appointments Calls pharmacy for medication refills Calls provider office for new concerns or questions Patient Goals: - make an activity or exercise plan - watch for swelling in feet, ankles and legs every day- develop a rescue plan- look over action plan mailed to your home - eat more whole grains, fruits and vegetables, lean meats and healthy fats - follow rescue plan if symptoms flare-up - know when to call the doctor - track symptoms and what helps feel better or worse  - follow a low salt diet - continue to weigh daily- call doctor for weight gain of 3 pounds overnight or 5 pounds in one week Follow Up Plan: Telephone follow up appointment with care management team member scheduled for:  03/22/2021

## 2021-02-23 ENCOUNTER — Telehealth: Payer: Self-pay | Admitting: Family Medicine

## 2021-02-23 NOTE — Progress Notes (Signed)
  Chronic Care Management   Note  02/23/2021 Name: KADARIUS CUFFE MRN: [id number] DOB: 11-17-1945  SCOTTI KOSTA is a 75 y.o. year old male who is a primary care patient of [name], MD. I reached out to Thornton Papas by phone today in response to a referral sent by Mr. Brien Lowe Belmar's PCP, [name], MD.   Mr. Fauth was given information about Chronic Care Management services today including:  CCM service includes personalized support from designated clinical staff supervised by his physician, including individualized plan of care and coordination with other care providers 24/7 contact phone numbers for assistance for urgent and routine care needs. Service will only be billed when office clinical staff spend 20 minutes or more in a month to coordinate care. Only one practitioner may furnish and bill the service in a calendar month. The patient may stop CCM services at any time (effective at the end of the month) by phone call to the office staff.   Patient agreed to services and verbal consent obtained.   Follow up plan:   Tatjana Secretary/administrator

## 2021-02-24 DIAGNOSIS — M9901 Segmental and somatic dysfunction of cervical region: Secondary | ICD-10-CM | POA: Diagnosis not present

## 2021-02-24 DIAGNOSIS — M9902 Segmental and somatic dysfunction of thoracic region: Secondary | ICD-10-CM | POA: Diagnosis not present

## 2021-02-24 DIAGNOSIS — M5033 Other cervical disc degeneration, cervicothoracic region: Secondary | ICD-10-CM | POA: Diagnosis not present

## 2021-02-24 DIAGNOSIS — M62830 Muscle spasm of back: Secondary | ICD-10-CM | POA: Diagnosis not present

## 2021-03-02 DIAGNOSIS — M9902 Segmental and somatic dysfunction of thoracic region: Secondary | ICD-10-CM | POA: Diagnosis not present

## 2021-03-02 DIAGNOSIS — M62830 Muscle spasm of back: Secondary | ICD-10-CM | POA: Diagnosis not present

## 2021-03-02 DIAGNOSIS — M5033 Other cervical disc degeneration, cervicothoracic region: Secondary | ICD-10-CM | POA: Diagnosis not present

## 2021-03-02 DIAGNOSIS — M9901 Segmental and somatic dysfunction of cervical region: Secondary | ICD-10-CM | POA: Diagnosis not present

## 2021-03-05 ENCOUNTER — Other Ambulatory Visit: Payer: Self-pay | Admitting: Family Medicine

## 2021-03-05 DIAGNOSIS — M9902 Segmental and somatic dysfunction of thoracic region: Secondary | ICD-10-CM | POA: Diagnosis not present

## 2021-03-05 DIAGNOSIS — M62830 Muscle spasm of back: Secondary | ICD-10-CM | POA: Diagnosis not present

## 2021-03-05 DIAGNOSIS — M9901 Segmental and somatic dysfunction of cervical region: Secondary | ICD-10-CM | POA: Diagnosis not present

## 2021-03-05 DIAGNOSIS — M5033 Other cervical disc degeneration, cervicothoracic region: Secondary | ICD-10-CM | POA: Diagnosis not present

## 2021-03-10 ENCOUNTER — Ambulatory Visit: Payer: Medicare HMO | Admitting: Endocrinology

## 2021-03-14 IMAGING — CT CT ANGIOGRAPHY CHEST
1 of 2 series · 19 of 32 positions shown · IV contrast (omnipaque)
Comparison: None.

CLINICAL DATA: Chills, cough, runny nose, elevated D-dimer.

EXAM:
CT ANGIOGRAPHY CHEST WITH CONTRAST
TECHNIQUE: Multidetector CT imaging of the chest was performed using the
standard protocol during bolus administration of intravenous
contrast. Multiplanar CT image reconstructions and MIPs were
obtained to evaluate the vascular anatomy.
CONTRAST:  100mL OMNIPAQUE IOHEXOL 350 MG/ML SOLN

[Series 5: thins · axial · 0.82mm/px · z∈[-222,+48]mm · 19 of 296 slices shown]
[im 13/296  lung]
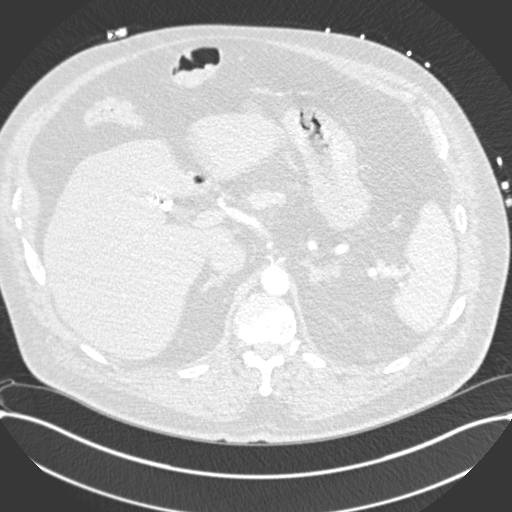
[im 26/296  soft-tissue]
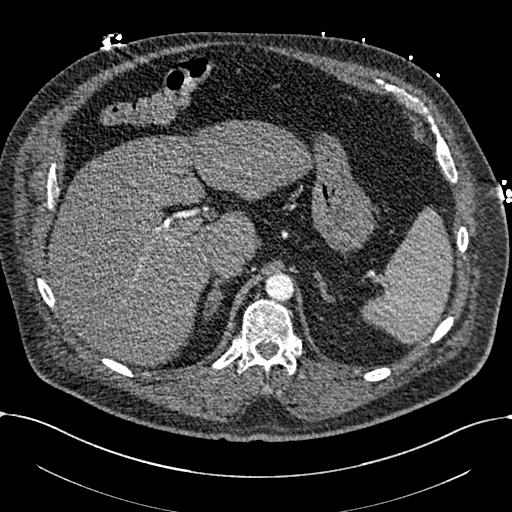
[im 39/296  lung]
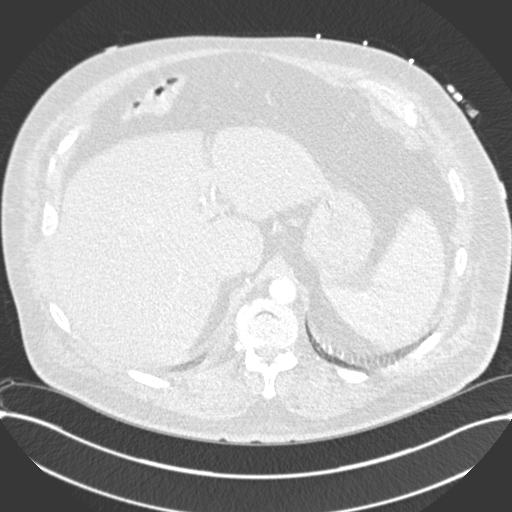
[im 65/296  soft-tissue]
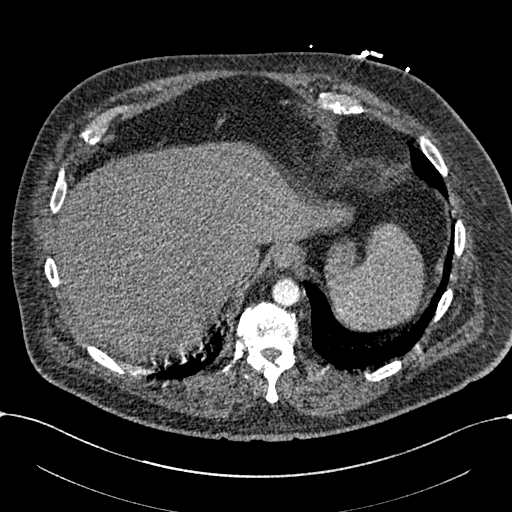
[im 77/296  lung]
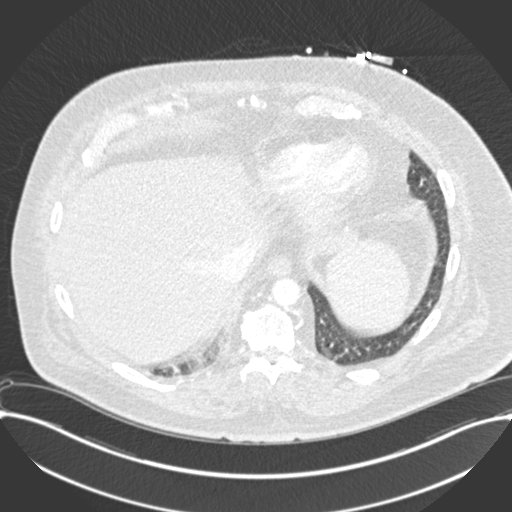
[im 90/296  soft-tissue]
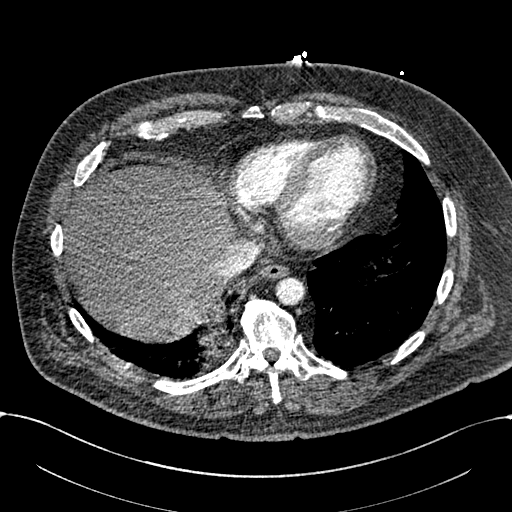
[im 103/296  lung]
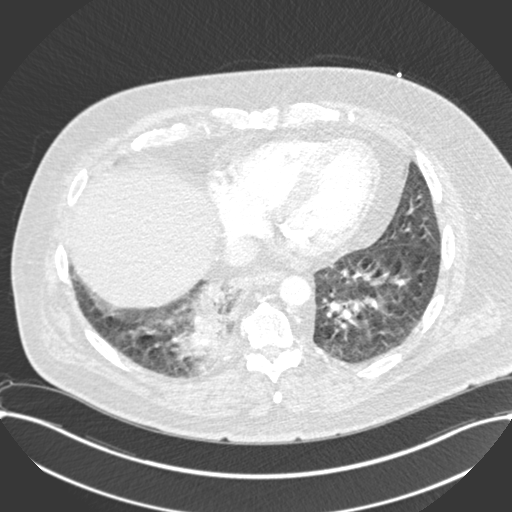
[im 116/296  soft-tissue]
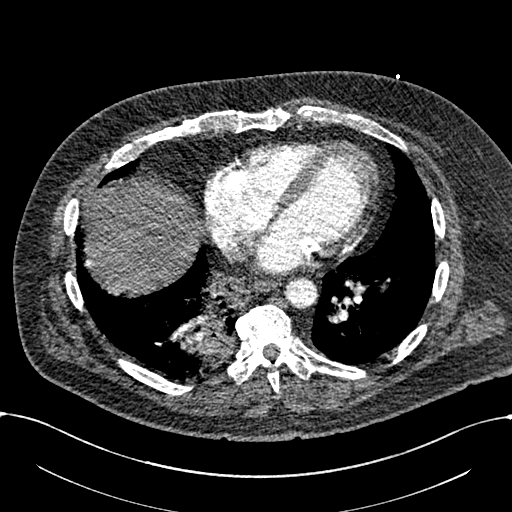
[im 129/296  lung]
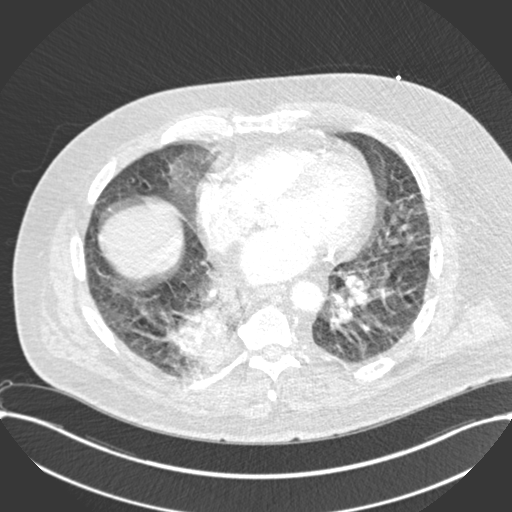
[im 154/296  soft-tissue]
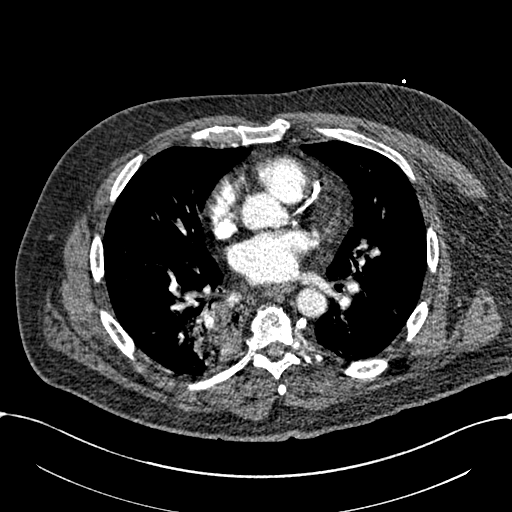
[im 167/296  lung]
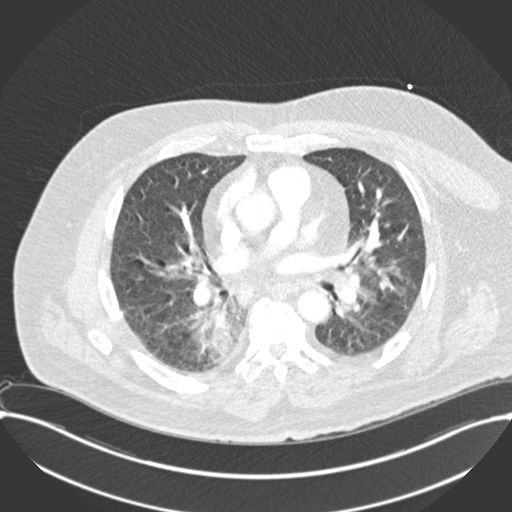
[im 180/296  soft-tissue]
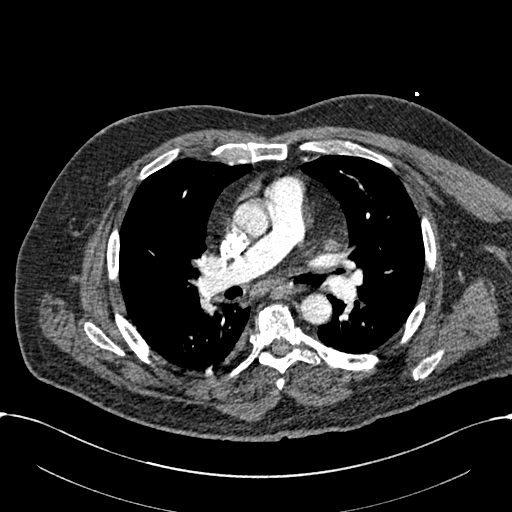
[im 193/296  lung]
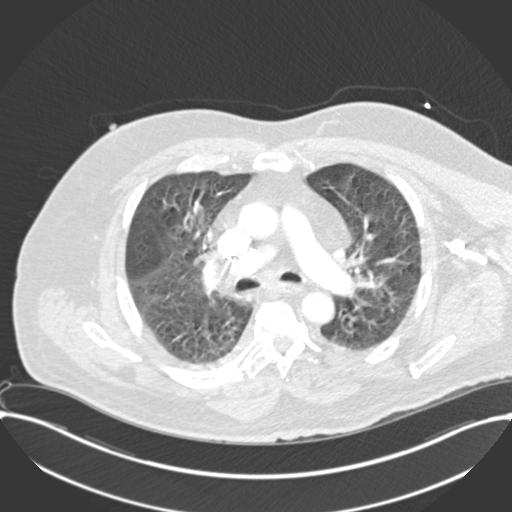
[im 206/296  soft-tissue]
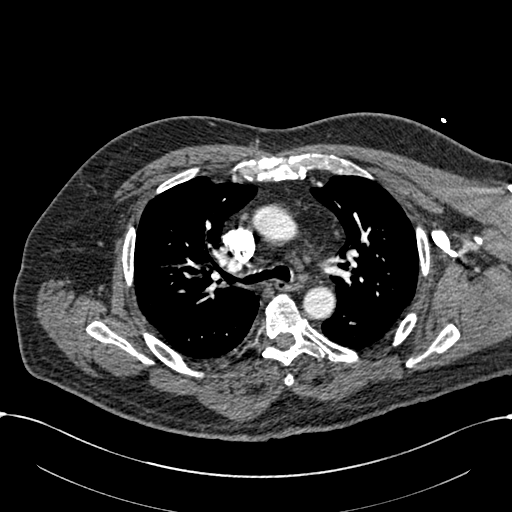
[im 219/296  lung]
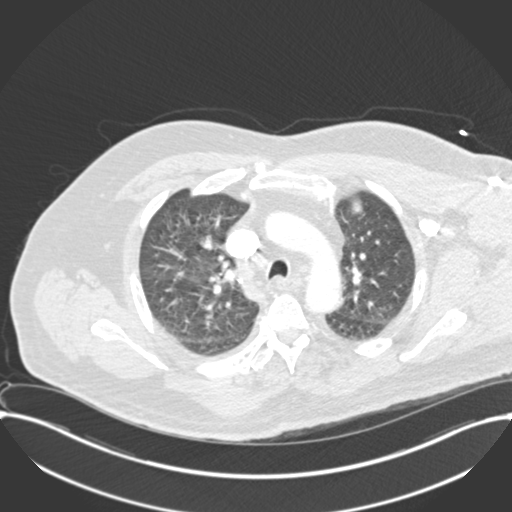
[im 231/296  soft-tissue]
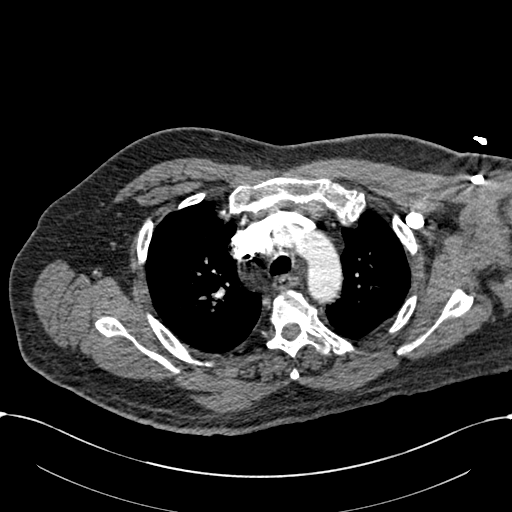
[im 257/296  lung]
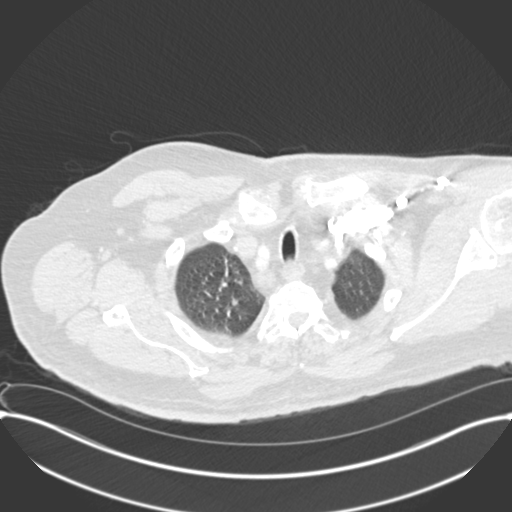
[im 270/296  soft-tissue]
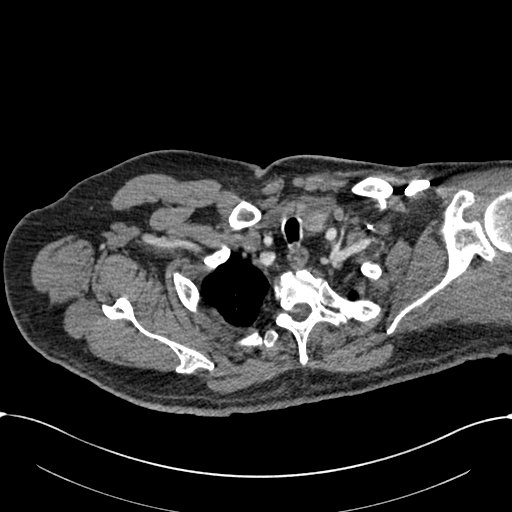
[im 283/296  lung]
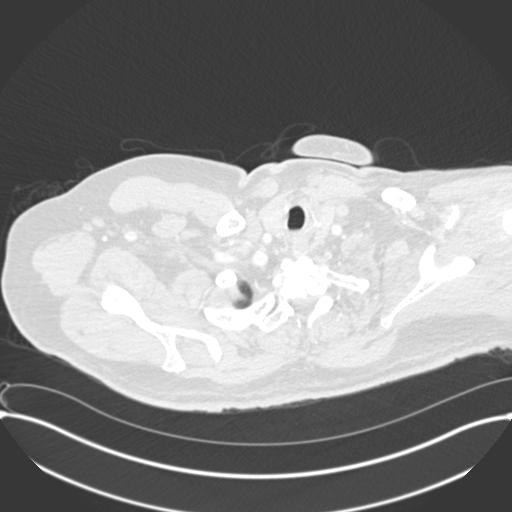

[19 of 32 positions shown; findings below may reference images not displayed]

FINDINGS: Cardiovascular: Majority of the peripheral segmental and
subsegmental pulmonary artery branches cannot be characterized due
to extensive patient breathing motion artifact. I cannot exclude
central obstructing pulmonary embolism within the main pulmonary
arteries bilaterally.

No thoracic aortic aneurysm or evidence of aortic dissection. Heart
size is within normal limits. No pericardial effusion. Diffuse
coronary artery calcifications. Coronary artery stent?

Mediastinum/Nodes: No mass or enlarged lymph nodes seen within the
mediastinum. Borderline enlarged lymph nodes are seen within the
RIGHT upper paratracheal space, RIGHT hilum and subcarinal space,
likely reactive in nature.

Esophagus appears normal. Trachea appears normal.

Lungs/Pleura: Dense consolidation within the RIGHT lower lobe,
consistent with pneumonia. Lungs otherwise clear. No pleural
effusion or pneumothorax.

Upper Abdomen: No acute findings.

Musculoskeletal: Mild degenerative spondylosis. No acute or
suspicious osseous finding.

Review of the MIP images confirms the above findings.
IMPRESSION: 1. RIGHT lower lobe pneumonia.
2. Majority of the peripheral segmental and subsegmental pulmonary
arteries cannot be characterized due to extensive patient breathing
motion artifact. No central obstructing pulmonary embolism is seen
within the main pulmonary arteries bilaterally.
3. Borderline enlarged lymph nodes within the mediastinum and right
hilum, likely reactive in nature.
4. Coronary artery calcifications.

Aortic Atherosclerosis ([id number].J).

## 2021-03-17 IMAGING — DX PORTABLE CHEST - 1 VIEW
1 series · 1 of 1 positions shown · non-contrast
Comparison: April 13, 2019

CLINICAL DATA: 73-year-old male with a history pneumonia

EXAM:
PORTABLE CHEST 1 VIEW

[chest ap]
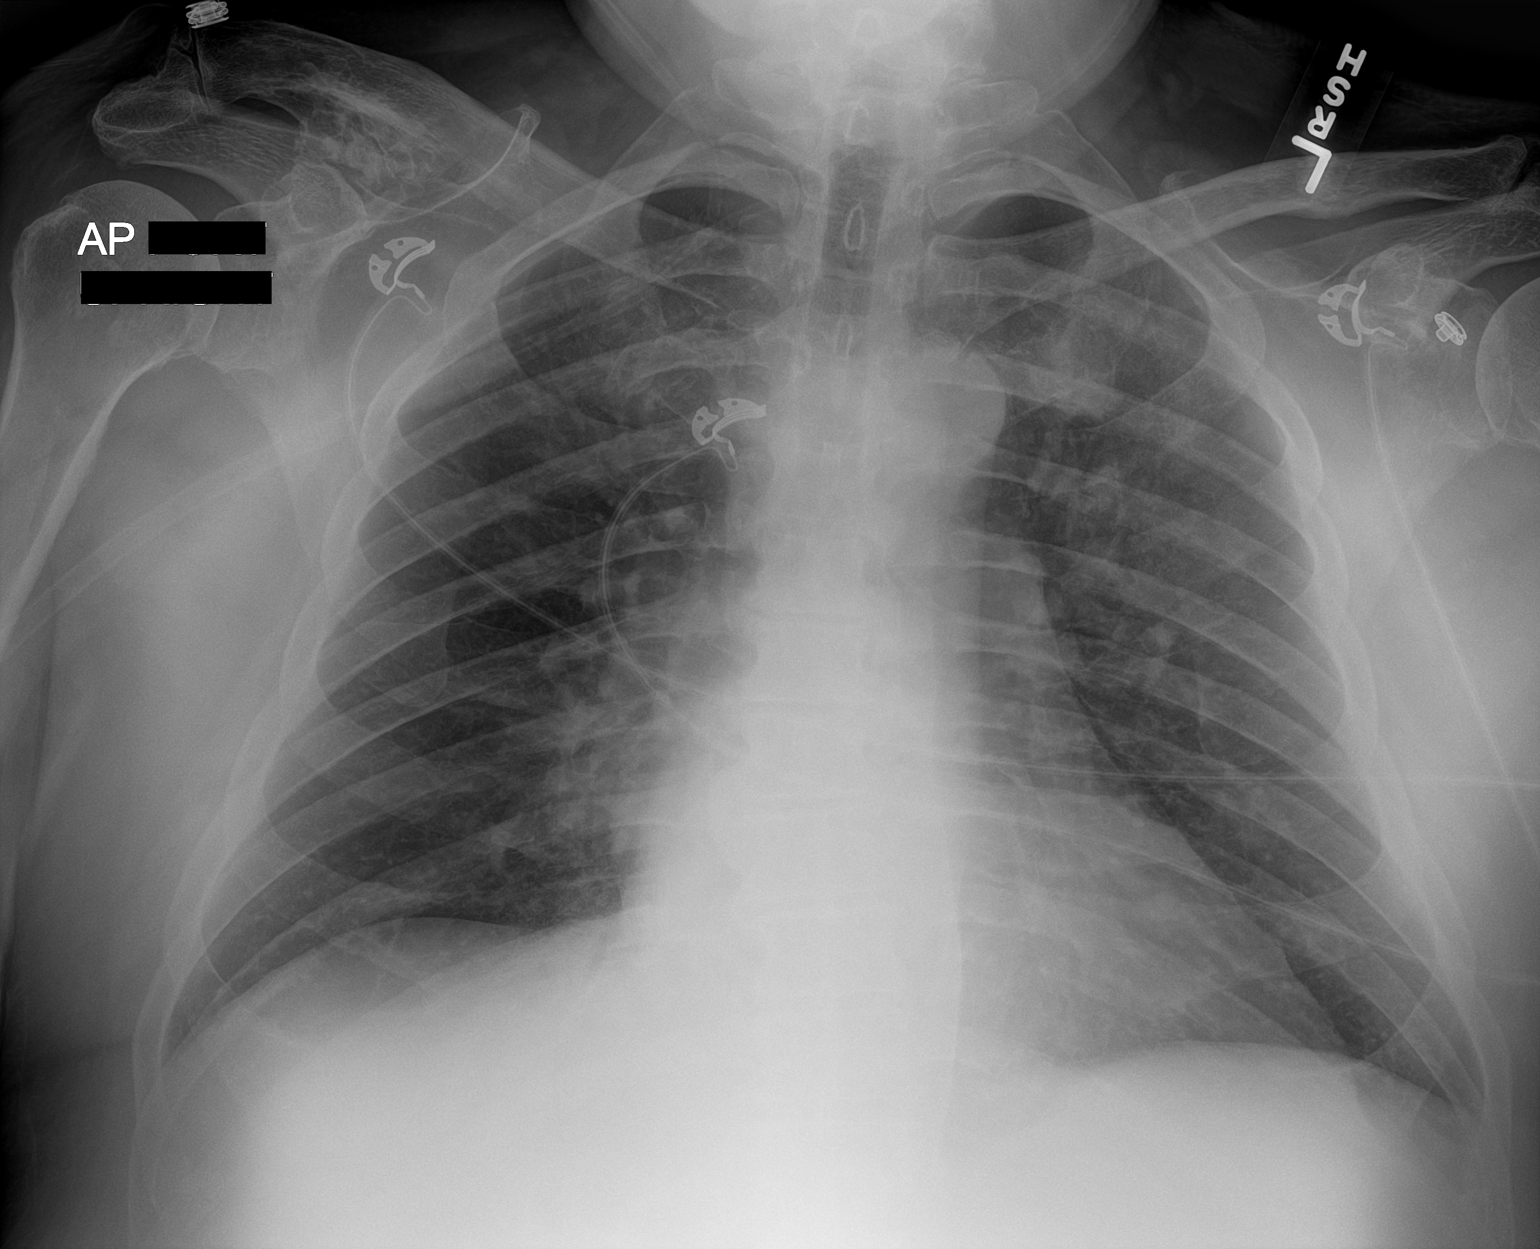

[1 of 1 positions shown; findings below may reference images not displayed]

FINDINGS: Cardiomediastinal silhouette unchanged in size and contour.

Interval removal of the endotracheal tube and gastric tube.

Significantly improved airspace opacities of bilateral lungs. No
pleural effusion or pneumothorax.

No displaced fracture identified.
IMPRESSION: Significant improvement in bilateral airspace opacities, with
interval removal of endotracheal tube and gastric tube.

## 2021-03-17 NOTE — Progress Notes (Deleted)
Cardiology Office Note:    Date:  03/17/2021   ID:  Cameron Daniel [birth year], MRN [id number]  PCP:  Cameron Frizzle, MD   [facility] Providers Cardiologist:  [name], MD (Inactive) Electrophysiologist:  Cameron Epley, MD  Advanced Heart Failure:  Glori Bickers, MD {   Referring MD: Cameron Frizzle, MD     History of Present Illness:    Cameron Daniel is a 75 y.o. male with a hx of VT s/p ablation in 05/2020, HTN, HLD, DM, CAD with NSTEMI 03/2019 (large retroperitoneal bleed post cath) s/p CABG (Grafts: L-LAD, S-OM, S-dRCA), ischemic CM with LVEF 45-50% who was previously followed by Dr. Meda Daniel who now presents to clinic for follow-up.  Per review of the record, patient had episode of VT in 10/201 and underwent ablation on 06/22/20. LHC at that time showed 3VD with patent LIMA and SVGs to LCX and RCA. TTE in 02/2020 showed EF 45-50, normal wall motion, moderate LVH, GRII DD, mildly reduced RV SF, mild LAE, trivial MR. Since then he had 3 episodes of nonsustained VT that were asymptomatic and self terminating.    Last saw Dr. Meda Daniel on 10/2020 where he was doing well with no anginal symptoms.   Past Medical History:  Diagnosis Date   Allergic rhinitis, cause unspecified    Allergy    Anxiety    Arthritis    Cancer ([place])    Carotid Doppler 04/2020   Carotid US 9/21: No evidence of ICA stenosis bilaterally; right vertebral artery stenosis   Cataract    Chronic systolic CHF (congestive heart failure) ([place])    Coronary atherosclerosis of unspecified type of vessel, native or graft    a. MI 1985 with unclear details. b. CABG 03/2019 with LIMA -> LAD, SVG -> OM, SVG to dRCA.   Depression    PTSD    Diverticulosis of colon (without mention of hemorrhage)    Esophageal reflux    Esophageal stricture    Essential hypertension    Former tobacco use    Hemorrhoids    Hyperlipidemia    Irritable bowel syndrome    Ischemic cardiomyopathy    Mild  carotid artery disease ([place])    a. 1-39% bilaterally 03/2019 duplex.   Myocardial infarction (HCC)    Nocturia    Obesity, unspecified    Orthostasis    Other acquired absence of organ    Personal history of colonic polyps    Psychosexual dysfunction with inhibited sexual excitement    Retroperitoneal bleed    Type II or unspecified type diabetes mellitus without mention of complication, not stated as uncontrolled     Past Surgical History:  Procedure Laterality Date   ADENOIDECTOMY     CARPAL TUNNEL RELEASE     left    CHOLECYSTECTOMY     COLON RESECTION     18 inches   COLON SURGERY  2013   CORONARY ANGIOPLASTY     1985    CORONARY ARTERY BYPASS GRAFT N/A 04/26/2019   Procedure: CORONARY ARTERY BYPASS GRAFTING (CABG) x Three, using left internal mammary artery and right leg greater saphenous vein harvested endoscopically;  Surgeon: Cameron Pollack, MD;  Location: [place];  Service: Open Heart Surgery;  Laterality: N/A;   ELBOW BURSA SURGERY     EYE SURGERY     ICD IMPLANT N/A 06/25/2020   Procedure: ICD IMPLANT;  Surgeon: Cameron Epley, MD;  Location: [place] [facility];  Service: Cardiovascular;  Laterality: N/A;   LEFT HEART CATH AND CORS/GRAFTS ANGIOGRAPHY N/A 06/19/2020   Procedure: LEFT HEART CATH AND CORS/GRAFTS ANGIOGRAPHY;  Surgeon: Cameron Sine, MD;  Location: [place] [facility];  Service: Cardiovascular;  Laterality: N/A;   POLYPECTOMY     RIGHT/LEFT HEART CATH AND CORONARY ANGIOGRAPHY N/A 04/16/2019   Procedure: RIGHT/LEFT HEART CATH AND CORONARY ANGIOGRAPHY;  Surgeon: Cameron Crome, MD;  Location: [place] [facility];  Service: Cardiovascular;  Laterality: N/A;   SHOULDER SURGERY     Rt. Shoulder   TEE WITHOUT CARDIOVERSION N/A 04/26/2019   Procedure: TRANSESOPHAGEAL ECHOCARDIOGRAM (TEE);  Surgeon: Cameron Pollack, MD;  Location: [place];  Service: Open Heart Surgery;  Laterality: N/A;   TONSILLECTOMY     V TACH ABLATION N/A 06/22/2020   Procedure: V TACH  ABLATION;  Surgeon: Cameron Epley, MD;  Location: [place] [facility];  Service: Cardiovascular;  Laterality: N/A;    Current Medications: No outpatient medications have been marked as taking for the 03/19/21 encounter (Appointment) with Cameron Bergeron, MD.     Allergies:   Doxazosin mesylate, Flomax [tamsulosin hcl], Penicillins, Sulfonamide derivatives, and Trulicity [dulaglutide]   Social History   Socioeconomic History   Marital status: Married    Spouse name: Not on file   Number of children: 1   Years of education: 12   Highest education level: High school graduate  Occupational History   Occupation: [employer]  Tobacco Use   Smoking status: Former    Types: Cigarettes    Quit date: 08/30/1983    Years since quitting: 37.5   Smokeless tobacco: Former   Tobacco comments:    Quit 30 years ago.  Vaping Use   Vaping Use: Never used  Substance and Sexual Activity   Alcohol use: Yes    Comment: rare   Drug use: No   Sexual activity: Yes  Other Topics Concern   Not on file  Social History Narrative   Not on file   Social Determinants of Health   Financial Resource Strain: Not on file  Food Insecurity: No Food Insecurity   Worried About Running Out of Food in the Last Year: Never true   Ran Out of Food in the Last Year: Never true  Transportation Needs: No Transportation Needs   Lack of Transportation (Medical): No   Lack of Transportation (Non-Medical): No  Physical Activity: Not on file  Stress: Not on file  Social Connections: Moderately Isolated   Frequency of Communication with Friends and Family: More than three times a week   Frequency of Social Gatherings with Friends and Family: More than three times a week   Attends Religious Services: Never   Marine scientist or Organizations: No   Attends Music therapist: Never   Marital Status: Married     Family History: The patient's ***family history includes Allergic rhinitis in his  father; Coronary artery disease in his mother; Lung cancer in his father. There is no history of Colon cancer, Heart attack, Hypertension, Stroke, Esophageal cancer, Rectal cancer, Stomach cancer, or Pancreatic cancer.  ROS:   Please see the history of present illness.    *** All other systems reviewed and are negative.  EKGs/Labs/Other Studies Reviewed:    The following studies were reviewed today: TTE 2020-07-04: IMPRESSIONS     1. Inferior and inferoseptal hypokinesis. Left ventricular ejection  fraction, by estimation, is 45 to 50%. The left ventricle has mildly  decreased function. The left ventricle  demonstrates regional wall motion  abnormalities (see scoring diagram/findings   for description). There is mild concentric left ventricular hypertrophy.  Left ventricular diastolic parameters are consistent with Grade II  diastolic dysfunction (pseudonormalization).   2. Right ventricular systolic function is normal. The right ventricular  size is normal.   3. Left atrial size was moderately dilated.   4. The mitral valve is normal in structure. Trivial mitral valve  regurgitation. No evidence of mitral stenosis. Moderate mitral annular  calcification.   5. The aortic valve is calcified. There is mild calcification of the  aortic valve. There is mild thickening of the aortic valve. Aortic valve  regurgitation is not visualized. No aortic stenosis is present.   6. The inferior vena cava is dilated in size with <50% respiratory  variability, suggesting right atrial pressure of 15 mmHg.   LHC 06/19/20: Ost LAD lesion is 80% stenosed. Prox LAD to Mid LAD lesion is 90% stenosed. Mid LAD lesion is 95% stenosed. Mid RCA lesion is 85% stenosed. Mid LM to Dist LM lesion is 70% stenosed. Prox Cx to Mid Cx lesion is 85% stenosed. Mid Cx to Dist Cx lesion is 95% stenosed. Non-stenotic Prox RCA lesion. Prox Graft lesion is 20% stenosed.   Severe native multivessel CAD with 70% distal  left main stenosis, 80% ostial LAD stenosis followed by segmental 90% stenoses before and after the first diagonal vessel with 95% stenosis in the midsegment proximal to a septal perforating artery with competitive filling seen distally; diffuse 80 to 95% circumflex stenoses; ectatic proximal RCA with 80% mid stenoses.   Patent LIMA graft supplying the mid LAD.   Patent SVG supplying the distal circumflex marginal vessel branch.   Patent SVG supplying the distal RCA with mild smooth 20% focal proximal to mid narrowing.   Fairly preserved global LV function with EF estimated 50 to 55%.  There is a suggestion of possible apical hypertrophy with nipple protrusion of the apex.  LVEDP 69mmHg.   RECOMMENDATION: The patient's bypass conduits are widely patent.  Further EP evaluation for possible defibrillator or VT ablation.  EKG:  EKG is *** ordered today.  The ekg ordered today demonstrates ***  Recent Labs: 06/20/2020: Magnesium 1.8 01/15/2021: ALT 22; BUN 15; Creat 0.84; Hemoglobin 15.2; Platelets 136; Potassium 4.1; Sodium 138  Recent Lipid Panel    Component Value Date/Time   CHOL 110 01/15/2021 0847   TRIG 254 (H) 01/15/2021 0847   TRIG 193 (H) 08/08/2006 0924   HDL 50 01/15/2021 0847   CHOLHDL 2.2 01/15/2021 0847   VLDL 40 (H) 12/15/2016 0815   LDLCALC 30 01/15/2021 0847   LDLDIRECT 49.9 08/26/2013 1011     Risk Assessment/Calculations:   {Does this patient have ATRIAL FIBRILLATION?[phone]}       Physical Exam:    VS:  There were no vitals taken for this visit.    Wt Readings from Last 3 Encounters:  02/12/21 225 lb (102.1 kg)  01/22/21 224 lb (101.6 kg)  12/16/20 224 lb (101.6 kg)     GEN: *** Well nourished, well developed in no acute distress HEENT: Normal NECK: No JVD; No carotid bruits LYMPHATICS: No lymphadenopathy CARDIAC: ***RRR, no murmurs, rubs, gallops RESPIRATORY:  Clear to auscultation without rales, wheezing or rhonchi  ABDOMEN: Soft, non-tender,  non-distended MUSCULOSKELETAL:  No edema; No deformity  SKIN: Warm and dry NEUROLOGIC:  Alert and oriented x 3 PSYCHIATRIC:  Normal affect   ASSESSMENT:    No diagnosis found. PLAN:  In order of problems listed above:  #CAD s/p CABG S/p non-STEMI in 03/2019 followed by CABG. Cath in 05/2020 showed severe 3 VD and all grafts patent. He is doing well without anginal symptoms.  -Continue ASA 81 mg daily -Continue carvedilol 3.125mg  daily -Continue rosuvastatin 40mg  daily   #Ventricular tachycardia in 05/2020 s/p ablation: Doing well. -s/p ablation and ICD placement, followed by Dr Quentin Ore   #Chronic systolic heart failure  #Ischemic CM: EF improved from 25-30% to 45-50% by most recent echocardiogram in 05/2020.  Ischemic etiology. NYHA II.  Volume status appears stable. -Continue carvedilol 3.125mg  BID -Continue losartan 25mg  daily -Continue Jardiance 25mg  daily   #Essential hypertension The patient's blood pressure is controlled on his current regimen.   -Continue carvedilol 3.125mg  BID -Continue losartan 25mg  daily   #Mixed hyperlipidemia -Continue rosuvastatin 40mg  daily   #Type 2 diabetes mellitus with hyperlipidemia (HCC) -Continue Jardiance 25mg  daily   #Bilateral carotid artery stenosis B/L 1-39% stenosis in 04/2020. -Continue ASA81 mg daily -Continue crestor 40mg  daily  {Are you ordering a CV Procedure (e.g. stress test, cath, DCCV, TEE, etc)?   Press F2        :[id number]}    Medication Adjustments/Labs and Tests Ordered: Current medicines are reviewed at length with the patient today.  Concerns regarding medicines are outlined above.  No orders of the defined types were placed in this encounter.  No orders of the defined types were placed in this encounter.   There are no Patient Instructions on file for this visit.   Signed, Cameron Bergeron, MD  03/17/2021 1:41 PM    [place]

## 2021-03-19 ENCOUNTER — Ambulatory Visit (HOSPITAL_BASED_OUTPATIENT_CLINIC_OR_DEPARTMENT_OTHER): Payer: Medicare HMO | Admitting: Cardiology

## 2021-03-19 NOTE — Progress Notes (Incomplete)
Cardiology Office Note:    Date:  03/19/2021   ID:  Cameron Daniel, Cameron Daniel [birth year], MRN [id number]  PCP:  [name], MD   [facility] Providers Cardiologist:  [name], MD (Inactive) Electrophysiologist:  Vickie Epley, MD  Advanced Heart Failure:  Glori Bickers, MD  {   Referring MD: [name], MD     History of Present Illness:    Cameron Daniel is a 75 y.o. male with a hx of VT s/p ablation in 05/2020, HTN, HLD, DM, CAD with NSTEMI 03/2019 (large retroperitoneal bleed post cath) s/p CABG (Grafts: L-LAD, S-OM, S-dRCA), ischemic CM with LVEF 45-50% who was previously followed by Dr. Meda Coffee who now presents to clinic for follow-up.  Per review of the record, patient had episode of VT in 10/201 and underwent ablation on 06/22/20. LHC at that time showed 3VD with patent LIMA and SVGs to LCX and RCA. TTE in 02/2020 showed EF 45-50, normal wall motion, moderate LVH, GRII DD, mildly reduced RV SF, mild LAE, trivial MR. Since then he had 3 episodes of nonsustained VT that were asymptomatic and self terminating.    Last saw Dr. Meda Coffee on 10/2020 where he was doing well with no anginal symptoms.   Today,  He denies chest pain, shortness of breath, palpitations, lightheaddness, headaches, syncope, LE edema, orthopnea, PND.    Past Medical History:  Diagnosis Date   Allergic rhinitis, cause unspecified    Allergy    Anxiety    Arthritis    Cancer ([place])    Carotid Doppler 04/2020   Carotid US 9/21: No evidence of ICA stenosis bilaterally; right vertebral artery stenosis   Cataract    Chronic systolic CHF (congestive heart failure) ([facility])    Coronary atherosclerosis of unspecified type of vessel, native or graft    a. MI 1985 with unclear details. b. CABG 03/2019 with LIMA -> LAD, SVG -> OM, SVG to dRCA.   Depression    PTSD    Diverticulosis of colon (without mention of hemorrhage)    Esophageal reflux    Esophageal stricture    Essential  hypertension    Former tobacco use    Hemorrhoids    Hyperlipidemia    Irritable bowel syndrome    Ischemic cardiomyopathy    Mild carotid artery disease ([place])    a. 1-39% bilaterally 03/2019 duplex.   Myocardial infarction (HCC)    Nocturia    Obesity, unspecified    Orthostasis    Other acquired absence of organ    Personal history of colonic polyps    Psychosexual dysfunction with inhibited sexual excitement    Retroperitoneal bleed    Type II or unspecified type diabetes mellitus without mention of complication, not stated as uncontrolled     Past Surgical History:  Procedure Laterality Date   ADENOIDECTOMY     CARPAL TUNNEL RELEASE     left    CHOLECYSTECTOMY     COLON RESECTION     18 inches   COLON SURGERY  2013   CORONARY ANGIOPLASTY     1985    CORONARY ARTERY BYPASS GRAFT N/A 04/26/2019   Procedure: CORONARY ARTERY BYPASS GRAFTING (CABG) x Three, using left internal mammary artery and right leg greater saphenous vein harvested endoscopically;  Surgeon: Gaye Pollack, MD;  Location: [place];  Service: Open Heart Surgery;  Laterality: N/A;   ELBOW BURSA SURGERY     EYE SURGERY     ICD IMPLANT N/A 06/25/2020  Procedure: ICD IMPLANT;  Surgeon: Vickie Epley, MD;  Location: [place] [facility];  Service: Cardiovascular;  Laterality: N/A;   LEFT HEART CATH AND CORS/GRAFTS ANGIOGRAPHY N/A 06/19/2020   Procedure: LEFT HEART CATH AND CORS/GRAFTS ANGIOGRAPHY;  Surgeon: Troy Sine, MD;  Location: [place] [facility];  Service: Cardiovascular;  Laterality: N/A;   POLYPECTOMY     RIGHT/LEFT HEART CATH AND CORONARY ANGIOGRAPHY N/A 04/16/2019   Procedure: RIGHT/LEFT HEART CATH AND CORONARY ANGIOGRAPHY;  Surgeon: Belva Crome, MD;  Location: [place] [facility];  Service: Cardiovascular;  Laterality: N/A;   SHOULDER SURGERY     Rt. Shoulder   TEE WITHOUT CARDIOVERSION N/A 04/26/2019   Procedure: TRANSESOPHAGEAL ECHOCARDIOGRAM (TEE);  Surgeon: Gaye Pollack, MD;   Location: [place];  Service: Open Heart Surgery;  Laterality: N/A;   TONSILLECTOMY     V TACH ABLATION N/A 06/22/2020   Procedure: V TACH ABLATION;  Surgeon: Vickie Epley, MD;  Location: [place] [facility];  Service: Cardiovascular;  Laterality: N/A;    Current Medications: No outpatient medications have been marked as taking for the 03/19/21 encounter (Appointment) with Freada Bergeron, MD.     Allergies:   Doxazosin mesylate, Flomax [tamsulosin hcl], Penicillins, Sulfonamide derivatives, and Trulicity [dulaglutide]   Social History   Socioeconomic History   Marital status: Married    Spouse name: Not on file   Number of children: 1   Years of education: 12   Highest education level: High school graduate  Occupational History   Occupation: [employer]  Tobacco Use   Smoking status: Former    Types: Cigarettes    Quit date: 08/30/1983    Years since quitting: 37.5   Smokeless tobacco: Former   Tobacco comments:    Quit 30 years ago.  Vaping Use   Vaping Use: Never used  Substance and Sexual Activity   Alcohol use: Yes    Comment: rare   Drug use: No   Sexual activity: Yes  Other Topics Concern   Not on file  Social History Narrative   Not on file   Social Determinants of Health   Financial Resource Strain: Not on file  Food Insecurity: No Food Insecurity   Worried About Running Out of Food in the Last Year: Never true   Ran Out of Food in the Last Year: Never true  Transportation Needs: No Transportation Needs   Lack of Transportation (Medical): No   Lack of Transportation (Non-Medical): No  Physical Activity: Not on file  Stress: Not on file  Social Connections: Moderately Isolated   Frequency of Communication with Friends and Family: More than three times a week   Frequency of Social Gatherings with Friends and Family: More than three times a week   Attends Religious Services: Never   Marine scientist or Organizations: No   Attends Programme researcher, broadcasting/film/video: Never   Marital Status: Married     Family History: The patient's family history includes Allergic rhinitis in his father; Coronary artery disease in his mother; Lung cancer in his father. There is no history of Colon cancer, Heart attack, Hypertension, Stroke, Esophageal cancer, Rectal cancer, Stomach cancer, or Pancreatic cancer.  ROS:   Review of Systems  Constitutional:  Negative for fever and weight loss.  HENT:  Negative for congestion, ear pain and hearing loss.   Eyes:  Negative for pain.  Respiratory:  Negative for cough, shortness of breath and wheezing.   Cardiovascular:  Negative  for chest pain, palpitations, orthopnea, claudication, leg swelling and PND.  Gastrointestinal:  Negative for constipation, diarrhea, nausea and vomiting.  Genitourinary:  Negative for dysuria and frequency.  Musculoskeletal:  Negative for myalgias.  Neurological:  Negative for headaches.  Endo/Heme/Allergies:  Negative for environmental allergies.  Psychiatric/Behavioral:  Negative for depression. The patient is not nervous/anxious.    EKGs/Labs/Other Studies Reviewed:    The following studies were reviewed today: TTE Jul 15, 2020: IMPRESSIONS     1. Inferior and inferoseptal hypokinesis. Left ventricular ejection  fraction, by estimation, is 45 to 50%. The left ventricle has mildly  decreased function. The left ventricle demonstrates regional wall motion  abnormalities (see scoring diagram/findings   for description). There is mild concentric left ventricular hypertrophy.  Left ventricular diastolic parameters are consistent with Grade II  diastolic dysfunction (pseudonormalization).   2. Right ventricular systolic function is normal. The right ventricular  size is normal.   3. Left atrial size was moderately dilated.   4. The mitral valve is normal in structure. Trivial mitral valve  regurgitation. No evidence of mitral stenosis. Moderate mitral annular  calcification.    5. The aortic valve is calcified. There is mild calcification of the  aortic valve. There is mild thickening of the aortic valve. Aortic valve  regurgitation is not visualized. No aortic stenosis is present.   6. The inferior vena cava is dilated in size with <50% respiratory  variability, suggesting right atrial pressure of 15 mmHg.   LHC 06/19/20: Ost LAD lesion is 80% stenosed. Prox LAD to Mid LAD lesion is 90% stenosed. Mid LAD lesion is 95% stenosed. Mid RCA lesion is 85% stenosed. Mid LM to Dist LM lesion is 70% stenosed. Prox Cx to Mid Cx lesion is 85% stenosed. Mid Cx to Dist Cx lesion is 95% stenosed. Non-stenotic Prox RCA lesion. Prox Graft lesion is 20% stenosed.   Severe native multivessel CAD with 70% distal left main stenosis, 80% ostial LAD stenosis followed by segmental 90% stenoses before and after the first diagonal vessel with 95% stenosis in the midsegment proximal to a septal perforating artery with competitive filling seen distally; diffuse 80 to 95% circumflex stenoses; ectatic proximal RCA with 80% mid stenoses.   Patent LIMA graft supplying the mid LAD.   Patent SVG supplying the distal circumflex marginal vessel branch.   Patent SVG supplying the distal RCA with mild smooth 20% focal proximal to mid narrowing.   Fairly preserved global LV function with EF estimated 50 to 55%.  There is a suggestion of possible apical hypertrophy with nipple protrusion of the apex.  LVEDP 81mHg.   RECOMMENDATION: The patient's bypass conduits are widely patent.  Further EP evaluation for possible defibrillator or VT ablation.  EKG:    03/19/2021: ***  Recent Labs: 06/20/2020: Magnesium 1.8 01/15/2021: ALT 22; BUN 15; Creat 0.84; Hemoglobin 15.2; Platelets 136; Potassium 4.1; Sodium 138  Recent Lipid Panel    Component Value Date/Time   CHOL 110 01/15/2021 0847   TRIG 254 (H) 01/15/2021 0847   TRIG 193 (H) 08/08/2006 0924   HDL 50 01/15/2021 0847   CHOLHDL 2.2  01/15/2021 0847   VLDL 40 (H) 12/15/2016 0815   LDLCALC 30 01/15/2021 0847   LDLDIRECT 49.9 08/26/2013 1011     Risk Assessment/Calculations:   {Does this patient have ATRIAL FIBRILLATION?:[phone]}       Physical Exam:    VS:  There were no vitals taken for this visit.    Wt Readings from Last 3 Encounters:  02/12/21 225 lb (102.1 kg)  01/22/21 224 lb (101.6 kg)  12/16/20 224 lb (101.6 kg)     GEN: Well nourished, well developed in no acute distress HEENT: Normal NECK: No JVD; No carotid bruits LYMPHATICS: No lymphadenopathy CARDIAC: RRR, no murmurs, rubs, gallops RESPIRATORY:  Clear to auscultation without rales, wheezing or rhonchi  ABDOMEN: Soft, non-tender, non-distended MUSCULOSKELETAL:  No edema; No deformity  SKIN: Warm and dry NEUROLOGIC:  Alert and oriented x 3 PSYCHIATRIC:  Normal affect   ASSESSMENT:    No diagnosis found. PLAN:    In order of problems listed above:  #CAD s/p CABG S/p non-STEMI in 03/2019 followed by CABG. Cath in 05/2020 showed severe 3 VD and all grafts patent. He is doing well without anginal symptoms.  -Continue ASA 81 mg daily -Continue carvedilol 3.'125mg'$  daily -Continue rosuvastatin '40mg'$  daily   #Ventricular tachycardia in 05/2020 s/p ablation: Doing well. -s/p ablation and ICD placement, followed by Dr Quentin Ore   #Chronic systolic heart failure  #Ischemic CM: EF improved from 25-30% to 45-50% by most recent echocardiogram in 05/2020.  Ischemic etiology. NYHA II.  Volume status appears stable. -Continue carvedilol 3.'125mg'$  BID -Continue losartan '25mg'$  daily -Continue Jardiance '25mg'$  daily   #Essential hypertension The patient's blood pressure is controlled on his current regimen.   -Continue carvedilol 3.'125mg'$  BID -Continue losartan '25mg'$  daily   #Mixed hyperlipidemia -Continue rosuvastatin '40mg'$  daily   #Type 2 diabetes mellitus with hyperlipidemia (HCC) -Continue Jardiance '25mg'$  daily   #Bilateral carotid artery  stenosis B/L 1-39% stenosis in 04/2020. -Continue ASA81 mg daily -Continue crestor '40mg'$  daily  {Are you ordering a CV Procedure (e.g. stress test, cath, DCCV, TEE, etc)?   Press F2        :[id number]  Follow-up in ***  Medication Adjustments/Labs and Tests Ordered: Current medicines are reviewed at length with the patient today.  Concerns regarding medicines are outlined above.  No orders of the defined types were placed in this encounter.  No orders of the defined types were placed in this encounter.   There are no Patient Instructions on file for this visit.    I,Zite Okoli,acting as a Education administrator for Freada Bergeron, MD.,have documented all relevant documentation on the behalf of Freada Bergeron, MD,as directed by  Freada Bergeron, MD while in the presence of Freada Bergeron, MD.   ***  Signed, Joaquin Music  03/19/2021 10:34 AM    [place]

## 2021-03-22 ENCOUNTER — Telehealth: Payer: Self-pay | Admitting: *Deleted

## 2021-03-22 ENCOUNTER — Telehealth: Payer: Medicare HMO

## 2021-03-22 NOTE — Telephone Encounter (Signed)
  Care Management   Follow Up Note   03/22/2021 Name: Cameron Daniel MRN: [id number] DOB: [birth year]   Referred by: [name], MD Reason for referral : Chronic Care Management (CHF, DM)   An unsuccessful telephone outreach was attempted today. The patient was referred to the case management team for assistance with care management and care coordination.   Follow Up Plan: Telephone follow up appointment with care management team member scheduled for:  upon Care Guide rescheduling.  Jacqlyn Larsen RNC, BSN RN Case Manager [facility] [phone]

## 2021-03-25 ENCOUNTER — Ambulatory Visit (INDEPENDENT_AMBULATORY_CARE_PROVIDER_SITE_OTHER): Payer: Medicare HMO

## 2021-03-25 DIAGNOSIS — I472 Ventricular tachycardia, unspecified: Secondary | ICD-10-CM

## 2021-03-26 LAB — CUP PACEART REMOTE DEVICE CHECK
Battery Remaining Longevity: 134 mo
Battery Voltage: 3.05 V
Brady Statistic RV Percent Paced: 0.01 %
Date Time Interrogation Session: 20220729022825
HighPow Impedance: 74 Ohm
Implantable Lead Implant Date: 20211028
Implantable Lead Location: 753860
Implantable Pulse Generator Implant Date: 20211028
Lead Channel Impedance Value: 399 Ohm
Lead Channel Impedance Value: 494 Ohm
Lead Channel Pacing Threshold Amplitude: 0.625 V
Lead Channel Pacing Threshold Pulse Width: 0.4 ms
Lead Channel Sensing Intrinsic Amplitude: 9.625 mV
Lead Channel Sensing Intrinsic Amplitude: 9.625 mV
Lead Channel Setting Pacing Amplitude: 2 V
Lead Channel Setting Pacing Pulse Width: 0.4 ms
Lead Channel Setting Sensing Sensitivity: 0.3 mV

## 2021-03-30 ENCOUNTER — Encounter: Payer: Self-pay | Admitting: Physician Assistant

## 2021-03-30 NOTE — Progress Notes (Addendum)
Cardiology Office Note    Date:  03/31/2021   ID:  Ervan, Omar August 22, 1946, MRN OR:[id number]  PCP:  [name], MD  Cardiologist:  [name], MD (Inactive)  Electrophysiologist:  Vickie Epley, MD   Chief Complaint: f/u CHF  History of Present Illness:   Cameron Daniel is a 75 y.o. male with history of CAD ([place] with unclear details, then CABG 03/2019 with LIMA -> LAD, SVG -> OM, SVG to dRCA with low output CHF and spontaneous RP bleed, chronic combined CHF, VT 05/2020 s/p EPS/ablation/scar modification and ICD implant, HTN, DM2, HL, orthostasis, GERD, former tobacco abuse, diverticulosis, IBS, mild carotid artery disease, depression, esophageal stricture who presents for routine follow-up.   To recap he had MI in 1985 with unclear intervention, treated by [name] at that time. Aortoiliac duplex 2014 showed no AAA. He did well for many years before presenting back to the ED August 2020 with fever and malaise. He was seen in the ER and given fluids and discharged but returned back with respiratory failure requiring intubation for PNA and CHF and NSTEMI. 2D echo 04/13/19 showed EF 25-30%, severe hypokinesis of the left ventricular, entire inferior wall and anteroseptal wall, mild LAE, small pericardial effusion. Covid testing was negative. Cath showed 3V CAD with low output. He was started on milrinone post-cath. He developed a large spontaneous RP bleed post cath (cath all done from arm). He ultimately underwent CABG on 04/26/19. Pre-op TEE EF 45-50%. Post CABG course was fortunately uneventful. Spironolactone was later stopped due to hyperkalemia, olmestartan stopped due to hypotension, and he followed with CHF team for a period of time before graduating from their services. He has since tolerated losartan OK. In 05/2020 he was admitted with persistent VT. (He had driven himself to the hospital!) He was initially treated with amiodarone but ultimately cardioverted  in ED. LHC showed patent grafts without specific culprit. He was seen by EP and underwent EPS/ablation with scar modification and ICD implantation. 2D Echo 05/2020 showed EF 45-50%, mild LVH, grade 2 DD, moderate LAE, normal RV.  He is seen back for follow-up today doing great. He has not had any recent cardiac limitations or symptoms. No CP, SOB, palpitations, dizziness, syncope or ICD shocks. He sees EP end of next week. He has no lower extremity edema and is tolerating his medication well. 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  Labwork independently reviewed: 12/2020 trig 254, LDL 30, A1C 7, CBC wnl, plt 136, K 4.1, Cr 0.84 05/2020 Mg 1.6->1.8  Past Medical History:  Diagnosis Date   Allergic rhinitis, cause unspecified    Allergy    Anxiety    Arthritis    Cancer ([place])    Carotid Doppler 04/2020   Carotid US 9/21: No evidence of ICA stenosis bilaterally; right vertebral artery stenosis   Cataract    Chronic combined systolic (congestive) and diastolic (congestive) heart failure (HCC)    Coronary atherosclerosis of unspecified type of vessel, native or graft    a. MI 1985 with unclear details. b. CABG 03/2019 with LIMA -> LAD, SVG -> OM, SVG to dRCA.   Depression    PTSD    Diverticulosis of colon (without mention of hemorrhage)    Esophageal reflux    Esophageal stricture    Essential hypertension  Former tobacco use    Hemorrhoids    Hyperlipidemia    ICD (implantable cardioverter-defibrillator) in place    Irritable bowel syndrome    Ischemic cardiomyopathy    Mild carotid artery disease ([place])    a. 1-39% bilaterally 03/2019 duplex.   Myocardial infarction (HCC)    Nocturia    Obesity, unspecified    Orthostasis    Other acquired absence of organ    Personal history of colonic polyps    Psychosexual dysfunction with inhibited sexual excitement     Retroperitoneal bleed    Type II or unspecified type diabetes mellitus without mention of complication, not stated as uncontrolled    VT (ventricular tachycardia) ([place])     Past Surgical History:  Procedure Laterality Date   ADENOIDECTOMY     CARPAL TUNNEL RELEASE     left    CHOLECYSTECTOMY     COLON RESECTION     18 inches   COLON SURGERY  2013   CORONARY ANGIOPLASTY     1985    CORONARY ARTERY BYPASS GRAFT N/A 04/26/2019   Procedure: CORONARY ARTERY BYPASS GRAFTING (CABG) x Three, using left internal mammary artery and right leg greater saphenous vein harvested endoscopically;  Surgeon: Gaye Pollack, MD;  Location: [facility];  Service: Open Heart Surgery;  Laterality: N/A;   ELBOW BURSA SURGERY     EYE SURGERY     ICD IMPLANT N/A 06/25/2020   Procedure: ICD IMPLANT;  Surgeon: Vickie Epley, MD;  Location: [facility];  Service: Cardiovascular;  Laterality: N/A;   LEFT HEART CATH AND CORS/GRAFTS ANGIOGRAPHY N/A 06/19/2020   Procedure: LEFT HEART CATH AND CORS/GRAFTS ANGIOGRAPHY;  Surgeon: Troy Sine, MD;  Location: [facility];  Service: Cardiovascular;  Laterality: N/A;   POLYPECTOMY     RIGHT/LEFT HEART CATH AND CORONARY ANGIOGRAPHY N/A 04/16/2019   Procedure: RIGHT/LEFT HEART CATH AND CORONARY ANGIOGRAPHY;  Surgeon: Belva Crome, MD;  Location: [facility];  Service: Cardiovascular;  Laterality: N/A;   SHOULDER SURGERY     Rt. Shoulder   TEE WITHOUT CARDIOVERSION N/A 04/26/2019   Procedure: TRANSESOPHAGEAL ECHOCARDIOGRAM (TEE);  Surgeon: Gaye Pollack, MD;  Location: [facility];  Service: Open Heart Surgery;  Laterality: N/A;   TONSILLECTOMY     V TACH ABLATION N/A 06/22/2020   Procedure: V TACH ABLATION;  Surgeon: Vickie Epley, MD;  Location: [facility];  Service: Cardiovascular;  Laterality: N/A;    Current Medications:  Current Meds  Medication Sig   acetaminophen (TYLENOL) 500 MG tablet Take 1,000 mg by mouth at bedtime as  needed for mild pain.   aspirin EC 81 MG tablet Take 1 tablet (81 mg total) by mouth daily.   azelastine (ASTELIN) 0.1 % nasal spray Place 2 sprays into both nostrils 2 (two) times daily. Use in each nostril as directed   carvedilol (COREG) 3.125 MG tablet Take 3.125 mg by mouth 2 (two) times daily with a meal.   citalopram (CELEXA) 10 MG tablet Take 1 tablet (10 mg total) by mouth daily.   colestipol (COLESTID) 5 g packet Take 5 g by mouth daily.    fexofenadine (ALLEGRA) 180 MG tablet Take 1 tablet (180 mg total) by mouth daily as needed. For allergies   finasteride (PROSCAR) 5 MG tablet TAKE 1 TABLET BY MOUTH EVERY DAY   fluticasone (FLONASE) 50 MCG/ACT nasal spray SPRAY 2 SPRAYS INTO EACH NOSTRIL EVERY DAY   JANUVIA 50 MG  tablet TAKE 1 TABLET BY MOUTH EVERY DAY   JARDIANCE 25 MG TABS tablet TAKE 25 MG (1 TABLET) BY MOUTH DAILY BEFORE BREAKFAST. STOP PIOGLITAZONE   losartan (COZAAR) 25 MG tablet TAKE 1 TABLET BY MOUTH EVERY DAY   loteprednol (LOTEMAX) 0.5 % ophthalmic suspension Place 1 drop into both eyes 4 (four) times daily as needed (itching).   montelukast (SINGULAIR) 10 MG tablet Take 1 tablet (10 mg total) by mouth at bedtime.   nystatin (MYCOSTATIN) 100000 UNIT/ML suspension Take 5 mLs (500,000 Units total) by mouth 4 (four) times daily.   Omega-3 Fatty Acids (FISH OIL) 1000 MG CPDR Take 1,000 mg by mouth daily.   pantoprazole (PROTONIX) 40 MG tablet TAKE 1 TABLET BY MOUTH EVERY DAY IN THE MORNING   rosuvastatin (CRESTOR) 40 MG tablet TAKE 1 TABLET (40 MG TOTAL) BY MOUTH DAILY AT 6 PM.   [DISCONTINUED] predniSONE (DELTASONE) 20 MG tablet 3 tabs poqday 1-2, 2 tabs poqday 3-4, 1 tab poqday 5-6     Allergies:   Doxazosin mesylate, Flomax [tamsulosin hcl], Penicillins, Sulfonamide derivatives, and Trulicity [dulaglutide]   Social History   Socioeconomic History   Marital status: Married    Spouse name: Not on file   Number of children: 1   Years of education: 12   Highest  education level: High school graduate  Occupational History   Occupation: [employer]  Tobacco Use   Smoking status: Former    Types: Cigarettes    Quit date: 08/30/1983    Years since quitting: 37.6   Smokeless tobacco: Former   Tobacco comments:    Quit 30 years ago.  Vaping Use   Vaping Use: Never used  Substance and Sexual Activity   Alcohol use: Yes    Comment: rare   Drug use: No   Sexual activity: Yes  Other Topics Concern   Not on file  Social History Narrative   Not on file   Social Determinants of Health   Financial Resource Strain: Not on file  Food Insecurity: No Food Insecurity   Worried About Running Out of Food in the Last Year: Never true   Ran Out of Food in the Last Year: Never true  Transportation Needs: No Transportation Needs   Lack of Transportation (Medical): No   Lack of Transportation (Non-Medical): No  Physical Activity: Not on file  Stress: Not on file  Social Connections: Moderately Isolated   Frequency of Communication with Friends and Family: More than three times a week   Frequency of Social Gatherings with Friends and Family: More than three times a week   Attends Religious Services: Never   Marine scientist or Organizations: No   Attends Music therapist: Never   Marital Status: Married     Family History:  The patient's family history includes Allergic rhinitis in his father; Coronary artery disease in his mother; Lung cancer in his father. There is no history of Colon cancer, Heart attack, Hypertension, Stroke, Esophageal cancer, Rectal cancer, Stomach cancer, or Pancreatic cancer.  ROS:   Please see the history of present illness.  All other systems are reviewed and otherwise negative.    EKGs/Labs/Other Studies Reviewed:    Studies reviewed are outlined and summarized above. Reports included below if pertinent.  2D Echo 05/2020  1. Inferior and inferoseptal hypokinesis. Left ventricular ejection  fraction, by  estimation, is 45 to 50%. The left ventricle has mildly  decreased function. The left ventricle demonstrates regional wall motion  abnormalities (  see scoring diagram/findings   for description). There is mild concentric left ventricular hypertrophy.  Left ventricular diastolic parameters are consistent with Grade II  diastolic dysfunction (pseudonormalization).   2. Right ventricular systolic function is normal. The right ventricular  size is normal.   3. Left atrial size was moderately dilated.   4. The mitral valve is normal in structure. Trivial mitral valve  regurgitation. No evidence of mitral stenosis. Moderate mitral annular  calcification.   5. The aortic valve is calcified. There is mild calcification of the  aortic valve. There is mild thickening of the aortic valve. Aortic valve  regurgitation is not visualized. No aortic stenosis is present.   6. The inferior vena cava is dilated in size with <50% respiratory  variability, suggesting right atrial pressure of 15 mmHg.   Cardiac Cath 05/2020 Conclusion    Ost LAD lesion is 80% stenosed. Prox LAD to Mid LAD lesion is 90% stenosed. Mid LAD lesion is 95% stenosed. Mid RCA lesion is 85% stenosed. Mid LM to Dist LM lesion is 70% stenosed. Prox Cx to Mid Cx lesion is 85% stenosed. Mid Cx to Dist Cx lesion is 95% stenosed. Non-stenotic Prox RCA lesion. Prox Graft lesion is 20% stenosed.   Severe native multivessel CAD with 70% distal left main stenosis, 80% ostial LAD stenosis followed by segmental 90% stenoses before and after the first diagonal vessel with 95% stenosis in the midsegment proximal to a septal perforating artery with competitive filling seen distally; diffuse 80 to 95% circumflex stenoses; ectatic proximal RCA with 80% mid stenoses.   Patent LIMA graft supplying the mid LAD.   Patent SVG supplying the distal circumflex marginal vessel branch.   Patent SVG supplying the distal RCA with mild smooth 20% focal  proximal to mid narrowing.   Fairly preserved global LV function with EF estimated 50 to 55%.  There is a suggestion of possible apical hypertrophy with nipple protrusion of the apex.  LVEDP 32mHg.   RECOMMENDATION: The patient's bypass conduits are widely patent.  Further EP evaluation for possible defibrillator or VT ablation.    INTRAOP TEE 04/26/19 POST-OP IMPRESSIONS - Left Ventricle: The left ventricle is unchanged from pre-bypass. has mildly reduced systolic function. - Aorta: The aorta appears unchanged from pre-bypass. - Aortic Valve: The aortic valve appears unchanged from pre-bypass. - Mitral Valve: The mitral valve appears unchanged from pre-bypass. - Tricuspid Valve: The tricuspid valve appears unchanged from pre-bypass. - Interatrial Septum: The interatrial septum appears unchanged from pre-bypass.   PRE-OP FINDINGS  Left Ventricle: The left ventricle has mildly reduced systolic function, with an ejection fraction of 45-50%. The cavity size was normal. mild concentric LV hypertrophy. There was hypokinesis of inferior septum. The LV wall thickness was 1.15 cm.   Right Ventricle: The right ventricle has normal systolic function. The cavity was normal. There is no increase in right ventricular wall thickness.   Left Atrium: Left atrial size was normal in size. The left atrial appendage is well visualized and there is no evidence of thrombus present.   Right Atrium: Right atrial size was normal in size.   Interatrial Septum: No atrial level shunt detected by color flow Doppler. Increased thickness of the atrial septum sparing the fossa ovalis consistent with lipomatous hypertrophy of the interatrial septum.   Pericardium: Trivial pericardial effusion is present. The pericardial effusion is posterior to the left ventricle.   Mitral Valve: The mitral valve is degenerative in appearance. Mild thickening of the mitral valve leaflet. Moderate  calcification of the mitral annulus.  Mitral valve regurgitation is trivial by color flow Doppler. There is no evidence of mitral valve vegetation. There was moderate mitral annular calcification most prominent at the base of the posterior leaflet. There was restricted motion of the posterior leaflet, but no evidence of mitral stenosis.     Tricuspid Valve: The tricuspid valve was normal in structure. Tricuspid valve regurgitation is trivial by color flow Doppler. The tricuspid valve is mildly thickened.   Aortic Valve: The aortic valve is tricuspid There is moderate thickening of the aortic valve and There is moderate sclerosis of the aortic valve aortic valve regurgitation was not visualized by color flow Doppler. There is no evidence of aortic valve stenosis. There is no evidence of a vegetation on the aortic valve.   Pulmonic Valve: The pulmonic valve was normal in structure. Pulmonic valve regurgitation is not visualized by color flow Doppler.     Aorta: There was a well defined aortic root and sino-tubular junction without dilatation or effacement. There was intimal thickening and calcification of the walls of the asecending aorta especially at the area of the right coronary cusp. There was no protruding atheromatous disease present.   The asecending aorta and aortic roort were normal in diameter.   The descending aorta was normal in diameter with intimal thickening present.     Roberts Gaudy MD Electronically signed by Roberts Gaudy MD Signature Date/Time: 04/26/2019/5:01:43 PM   Final     2D echo 03/2019 IMPRESSIONS  1. The left ventricle has severely reduced systolic function, with an ejection fraction of 25-30%. The cavity size was normal. There is mildly increased left ventricular wall thickness. Left ventricular diastolic Doppler parameters are indeterminate.  2. Severe hypokinesis of the left ventricular, entire inferior wall and anteroseptal wall.  3. Left atrial size was mildly dilated.  4. Small pericardial  effusion.  5. The pericardial effusion is posterior to the left ventricle.  6. The mitral valve is abnormal. Mild thickening of the mitral valve leaflet. There is mild to moderate mitral annular calcification present.  7. The aortic valve is tricuspid. Mild sclerosis of the aortic valve. No stenosis of the aortic valve.  8. The aorta is normal unless otherwise noted.  9. The inferior vena cava was dilated in size with <50% respiratory variability. 10. The interatrial septum was not assessed.   Carotid duplex 03/2019 Summary: Right Carotid: Velocities in the right ICA are consistent with a 1-39% stenosis.   Left Carotid: Velocities in the left ICA are consistent with a 1-39% stenosis. Vertebrals:  Left vertebral artery demonstrates antegrade flow. Right vertebral              artery was not visualized. Subclavians: Normal flow hemodynamics were seen in bilateral subclavian              arteries.   Right ABI: Resting right ankle-brachial index indicates noncompressible right lower extremity arteries. Left ABI: Resting left ankle-brachial index indicates noncompressible left lower extremity arteries. Right Upper Extremity: Doppler waveform obliterate with right radial compression. Doppler waveform obliterate with right ulnar compression. Left Upper Extremity: Doppler waveforms decrease 50% with left radial compression. Doppler waveforms decrease 50% with left ulnar compression.      EKG:  EKG is ordered today, personally reviewed, demonstrating NSR 72bpm, prior anterolateral infarct, cannot exclude prior inferior infarct, poor R wave progression, nonspecific TW changes, baseline artifact seen inferior leads - similar to prior. It is more obvious that this is NSR in the  precordial leads.   Recent Labs: 06/20/2020: Magnesium 1.8 01/15/2021: ALT 22; BUN 15; Creat 0.84; Hemoglobin 15.2; Platelets 136; Potassium 4.1; Sodium 138  Recent Lipid Panel    Component Value Date/Time   CHOL 110  01/15/2021 0847   TRIG 254 (H) 01/15/2021 0847   TRIG 193 (H) 08/08/2006 0924   HDL 50 01/15/2021 0847   CHOLHDL 2.2 01/15/2021 0847   VLDL 40 (H) 12/15/2016 0815   LDLCALC 30 01/15/2021 0847   LDLDIRECT 49.9 08/26/2013 1011    PHYSICAL EXAM:    VS:  BP 114/70   Pulse 72   Ht '5\' 11"'$  (1.803 m)   Wt 224 lb 12.8 oz (102 kg)   SpO2 98%   BMI 31.35 kg/m   BMI: Body mass index is 31.35 kg/m.  GEN: Well nourished, well developed male in no acute distress HEENT: normocephalic, atraumatic Neck: no JVD, carotid bruits, or masses Cardiac: RRR; no murmurs, rubs, or gallops, no edema  Respiratory:  clear to auscultation bilaterally, normal work of breathing GI: soft, nontender, nondistended, + BS MS: no deformity or atrophy Skin: warm and dry, no rash. ICD L upper chest with no evidence of hematoma or redness/dehiscence Neuro:  Alert and Oriented x 3, Strength and sensation are intact, follows commands Psych: euthymic mood, full affect  Wt Readings from Last 3 Encounters:  03/31/21 224 lb 12.8 oz (102 kg)  02/12/21 225 lb (102.1 kg)  01/22/21 224 lb (101.6 kg)     ASSESSMENT & PLAN:   1. CAD s/p CABG with goal LDL <70 - doing well without any angina. Continue ASA, carvedilol, rosuvastatin. Recent lipids reviewed. LDL at goal but triglycerides were high. Pt thinks this might have been the [employer] he has been eating. Consider recheck at next OV and if not improved, consider Vascepa. He does inquire whether he would be OK to return to work on a very part-time basis. He already does the same activity of mowing at home and tolerates this well. He is cognizant of staying cool while working outside. I will reach out to Dr. Quentin Ore for input (in Dr. Francesca Oman departure he had not yet established with gen cards/Dr. Johney Frame yet but will plan to do so for next OV.  2. Chronic combined CHF - clinically euvolemic. He has not tolerated higher doses/acceleration of his regimen due to hypotension. He  did not tolerate olmesartan for this reason but is doing well on losartan - for this reason would hold off on Entresto. Continue carvedilol. He is not on spironolactone due to hyperkalemia. Reviewed 2g sodium restriction, 2L fluid restriction, daily weights with patient.  3. VT s/p ablation and ICD followed by EP - denies any recent symptoms or ICD shocks. He knows to summon emergency care and not to drive himself to the hospital should symptoms recur. At time of event in 05/2020, Mg was borderline low so will recheck today along with BMET. Continue carvedilol. F/u EP as planned later this month.  4. Essential HTN - controlled on present regimen. No changes made today. The only updates to his medicine list were clarifying the non-cardiac meds he no longer takes (voltaren and nystatin creams, eye drops that have been replaced with a different agent). He also indicated that he was on longer taking prednisone as he'd finished the course - we had a hard time getting this to discontinue off his list but were able to do so post-visit. He is aware that he's no longer taking.  Disposition: F/u with EP  as scheduled, and Dr. Johney Frame in 6 months to establish care.   Medication Adjustments/Labs and Tests Ordered: Current medicines are reviewed at length with the patient today.  Concerns regarding medicines are outlined above. Medication changes, Labs and Tests ordered today are summarized above and listed in the Patient Instructions accessible in Encounters.   Signed, Charlie Pitter, PA-C  03/31/2021 11:26 AM    [address], [place], [place]  [address] Phone: [phone]; Fax: [phone]

## 2021-03-31 ENCOUNTER — Encounter: Payer: Self-pay | Admitting: Physician Assistant

## 2021-03-31 ENCOUNTER — Telehealth: Payer: Self-pay | Admitting: Physician Assistant

## 2021-03-31 ENCOUNTER — Other Ambulatory Visit: Payer: Self-pay

## 2021-03-31 ENCOUNTER — Ambulatory Visit: Payer: Medicare HMO | Admitting: Physician Assistant

## 2021-03-31 VITALS — BP 114/70 | HR 72 | Ht 71.0 in | Wt 224.8 lb

## 2021-03-31 DIAGNOSIS — I2510 Atherosclerotic heart disease of native coronary artery without angina pectoris: Secondary | ICD-10-CM

## 2021-03-31 DIAGNOSIS — E785 Hyperlipidemia, unspecified: Secondary | ICD-10-CM

## 2021-03-31 DIAGNOSIS — I10 Essential (primary) hypertension: Secondary | ICD-10-CM | POA: Diagnosis not present

## 2021-03-31 DIAGNOSIS — I472 Ventricular tachycardia, unspecified: Secondary | ICD-10-CM

## 2021-03-31 DIAGNOSIS — I5042 Chronic combined systolic (congestive) and diastolic (congestive) heart failure: Secondary | ICD-10-CM | POA: Diagnosis not present

## 2021-03-31 LAB — BASIC METABOLIC PANEL
BUN/Creatinine Ratio: 20 (ref 10–24)
BUN: 16 mg/dL (ref 8–27)
CO2: 26 mmol/L (ref 20–29)
Calcium: 10.3 mg/dL — ABNORMAL HIGH (ref 8.6–10.2)
Chloride: 98 mmol/L (ref 96–106)
Creatinine, Ser: 0.8 mg/dL (ref 0.76–1.27)
Glucose: 146 mg/dL — ABNORMAL HIGH (ref 65–99)
Potassium: 4.1 mmol/L (ref 3.5–5.2)
Sodium: 138 mmol/L (ref 134–144)
eGFR: 93 mL/min/{1.73_m2} (ref >59)

## 2021-03-31 LAB — MAGNESIUM: Magnesium: 1.7 mg/dL (ref 1.6–2.3)

## 2021-03-31 NOTE — Telephone Encounter (Signed)
When you call patient for labs tomorrow, please make sure he knows he can cross prednisone off his AVS. With the new med rec system, I couldn't quite get the EMR to accept that it was discontinued it since he completed course but was able to do so post-visit. He no longer takes it because he finished it up. Thanks. Alec Jaros PA-C

## 2021-03-31 NOTE — Patient Instructions (Addendum)
Medication Instructions:  Your physician recommends that you continue on your current medications as directed. Please refer to the Current Medication list given to you today.  *If you need a refill on your cardiac medications before your next appointment, please call your pharmacy*   Lab Work: TODAY:  BMET & MAG   If you have labs (blood work) drawn today and your tests are completely normal, you will receive your results only by: [place] (if you have MyChart) OR A paper copy in the mail If you have any lab test that is abnormal or we need to change your treatment, we will call you to review the results.   Testing/Procedures: None ordered   Follow-Up: At [facility], you and your health needs are our priority.  As part of our continuing mission to provide you with exceptional heart care, we have created designated Provider Care Teams.  These Care Teams include your primary Cardiologist (physician) and Advanced Practice Providers (APPs -  Physician Assistants and Nurse Practitioners) who all work together to provide you with the care you need, when you need it.  We recommend signing up for the patient portal called "MyChart".  Sign up information is provided on this After Visit Summary.  MyChart is used to connect with patients for Virtual Visits (Telemedicine).  Patients are able to view lab/test results, encounter notes, upcoming appointments, etc.  Non-urgent messages can be sent to your provider as well.   To learn more about what you can do with MyChart, go to [email or web address].    Your next appointment:   6 month(s)  The format for your next appointment:   In Person  Provider:   Gwyndolyn Kaufman, MD   Other Instructions  For patients with congestive heart failure, we give them these special instructions:  1. Follow a low-salt diet - you should be eating no more than 2,'000mg'$  of sodium per day. This does not necessarily just apply to the salt you put on top  of prepared food, but the sodium already in food. Processed food, frozen meals, canned goods, deli meat, and bread can have a surprising amount of sodium per serving so be sure to track this daily. 2. Watch your fluid intake. In general, you should not be taking in more than 2 liters of fluid per day (close to 64 oz of fluid per day). This includes sources of water in foods like soup, coffee, tea, milk, etc. It's important to stay hydrated but NOT to excess. 2. Weigh yourself on the same scale at same time of day and keep a log. 3. Call your doctor: (Anytime you feel any of the following symptoms)  - 3lb weight gain overnight or 5lb within a few days - Shortness of breath, with or without a dry hacking cough  - Swelling in the hands, feet or stomach  - If you have to sleep on extra pillows at night in order to breathe   IT IS IMPORTANT TO LET YOUR DOCTOR KNOW EARLY ON IF YOU ARE HAVING SYMPTOMS SO WE CAN HELP YOU!

## 2021-04-01 ENCOUNTER — Telehealth: Payer: Self-pay | Admitting: *Deleted

## 2021-04-01 DIAGNOSIS — Z79899 Other long term (current) drug therapy: Secondary | ICD-10-CM

## 2021-04-01 MED ORDER — MAGNESIUM OXIDE 400 MG PO TABS: 400.0000 mg | ORAL_TABLET | Freq: Two times a day (BID) | ORAL | 11 refills | Status: DC

## 2021-04-01 NOTE — Telephone Encounter (Signed)
Pt aware that we have removed Prednisone off of his medication list after he left the office, and to mark it off of his.  He verbalized understanding.

## 2021-04-01 NOTE — Telephone Encounter (Signed)
-----   Message from Charlie Pitter, [place] sent at 04/01/2021 11:06 AM EDT ----- Blima Rich, please let patient know that labs overall look good, a few abnormalities: - Magnesium level is borderline low. Given history of VT, goal 2.0 or greater. Start rx magnesium oxide '400mg'$  BID. Recheck Mg level when he comes in to see Jonni Sanger on 8/12 (non-fasting). - Calcium level appears mildly elevated and has been borderline for several checks back to 2020. Make sure he is not on any calcium supplements. Otherwise I reviewed meds with pharmD and on no culprits for this. I will forward labs to PCP but please ask patient to schedule appt with primary care to review further potential further eval of hypercalcemia.

## 2021-04-07 NOTE — Progress Notes (Signed)
Electrophysiology Office Note Date: 04/09/2021  ID:  Cameron Daniel, Cameron Daniel [birth year], MRN [id number]  PCP: [name], MD Primary Cardiologist: [name], MD (Inactive) Electrophysiologist: Vickie Epley, MD   CC: Routine ICD follow-up  Cameron Daniel is a 75 y.o. male seen today for Vickie Epley, MD for routine electrophysiology followup.  Since last being seen in our clinic the patient reports doing very well. Frequent "VT" episodes noted on his device felt to be SVT with aberrancy. Quiescent since 7/1. 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  he denies chest pain, palpitations, dyspnea, PND, orthopnea, nausea, vomiting, dizziness, syncope, edema, weight gain, or early satiety. He has not had ICD shocks.   Device History: Medtronic Single Chamber ICD implanted 05/2020 for secondary prevention   Past Medical History:  Diagnosis Date   Allergic rhinitis, cause unspecified    Allergy    Anxiety    Arthritis    Cancer ([place])    Carotid Doppler 04/2020   Carotid US 9/21: No evidence of ICA stenosis bilaterally; right vertebral artery stenosis   Cataract    Chronic combined systolic (congestive) and diastolic (congestive) heart failure (HCC)    Coronary atherosclerosis of unspecified type of vessel, native or graft    a. MI 1985 with unclear details. b. CABG 03/2019 with LIMA -> LAD, SVG -> OM, SVG to dRCA.   Depression    PTSD    Diverticulosis of colon (without mention of hemorrhage)    Esophageal reflux    Esophageal stricture    Essential hypertension    Former tobacco use    Hemorrhoids    Hyperlipidemia    ICD (implantable cardioverter-defibrillator) in place    Irritable bowel syndrome    Ischemic cardiomyopathy    Mild carotid artery disease ([place])    a. 1-39% bilaterally 03/2019 duplex.   Myocardial infarction (HCC)    Nocturia    Obesity, unspecified    Orthostasis    Other acquired absence of organ     Personal history of colonic polyps    Psychosexual dysfunction with inhibited sexual excitement    Retroperitoneal bleed    Type II or unspecified type diabetes mellitus without mention of complication, not stated as uncontrolled    VT (ventricular tachycardia) ([place])    Past Surgical History:  Procedure Laterality Date   ADENOIDECTOMY     CARPAL TUNNEL RELEASE     left    CHOLECYSTECTOMY     COLON RESECTION     18 inches   COLON SURGERY  2013   CORONARY ANGIOPLASTY     1985    CORONARY ARTERY BYPASS GRAFT N/A 04/26/2019   Procedure: CORONARY ARTERY BYPASS GRAFTING (CABG) x Three, using left internal mammary artery and right leg greater saphenous vein harvested endoscopically;  Surgeon: Gaye Pollack, MD;  Location: [place];  Service: Open Heart Surgery;  Laterality: N/A;   ELBOW BURSA SURGERY     EYE SURGERY     ICD IMPLANT N/A 06/25/2020   Procedure: ICD IMPLANT;  Surgeon: Vickie Epley, MD;  Location: [place] [facility];  Service: Cardiovascular;  Laterality: N/A;   LEFT HEART CATH AND CORS/GRAFTS ANGIOGRAPHY N/A 06/19/2020   Procedure: LEFT HEART CATH AND CORS/GRAFTS ANGIOGRAPHY;  Surgeon: Troy Sine, MD;  Location: [place] [facility];  Service: Cardiovascular;  Laterality: N/A;   POLYPECTOMY     RIGHT/LEFT HEART CATH AND CORONARY ANGIOGRAPHY N/A 04/16/2019   Procedure:  RIGHT/LEFT HEART CATH AND CORONARY ANGIOGRAPHY;  Surgeon: Belva Crome, MD;  Location: [place] [facility];  Service: Cardiovascular;  Laterality: N/A;   SHOULDER SURGERY     Rt. Shoulder   TEE WITHOUT CARDIOVERSION N/A 04/26/2019   Procedure: TRANSESOPHAGEAL ECHOCARDIOGRAM (TEE);  Surgeon: Gaye Pollack, MD;  Location: [place];  Service: Open Heart Surgery;  Laterality: N/A;   TONSILLECTOMY     V TACH ABLATION N/A 06/22/2020   Procedure: V TACH ABLATION;  Surgeon: Vickie Epley, MD;  Location: [place] [facility];  Service: Cardiovascular;  Laterality: N/A;    Current Outpatient Medications   Medication Sig Dispense Refill   acetaminophen (TYLENOL) 500 MG tablet Take 1,000 mg by mouth at bedtime as needed for mild pain.     aspirin EC 81 MG tablet Take 1 tablet (81 mg total) by mouth daily. 90 tablet 3   azelastine (ASTELIN) 0.1 % nasal spray Place 2 sprays into both nostrils 2 (two) times daily. Use in each nostril as directed 30 mL 12   carvedilol (COREG) 3.125 MG tablet Take 3.125 mg by mouth 2 (two) times daily with a meal.     citalopram (CELEXA) 10 MG tablet Take 1 tablet (10 mg total) by mouth daily. 90 tablet 2   colestipol (COLESTID) 5 g packet Take 5 g by mouth daily.      fexofenadine (ALLEGRA) 180 MG tablet Take 1 tablet (180 mg total) by mouth daily as needed. For allergies 90 tablet 0   finasteride (PROSCAR) 5 MG tablet TAKE 1 TABLET BY MOUTH EVERY DAY 90 tablet 3   JANUVIA 50 MG tablet TAKE 1 TABLET BY MOUTH EVERY DAY 30 tablet 5   JARDIANCE 25 MG TABS tablet TAKE 25 MG (1 TABLET) BY MOUTH DAILY BEFORE BREAKFAST. STOP PIOGLITAZONE 90 tablet 1   losartan (COZAAR) 25 MG tablet TAKE 1 TABLET BY MOUTH EVERY DAY 90 tablet 2   loteprednol (LOTEMAX) 0.5 % ophthalmic suspension Place 1 drop into both eyes 4 (four) times daily as needed (itching). 5 mL 1   magnesium oxide (MAG-OX) 400 MG tablet Take 1 tablet (400 mg total) by mouth 2 (two) times daily. 60 tablet 11   montelukast (SINGULAIR) 10 MG tablet Take 1 tablet (10 mg total) by mouth at bedtime. 30 tablet 3   OVER THE COUNTER MEDICATION Balance of Nature     pantoprazole (PROTONIX) 40 MG tablet TAKE 1 TABLET BY MOUTH EVERY DAY IN THE MORNING 90 tablet 3   rosuvastatin (CRESTOR) 40 MG tablet TAKE 1 TABLET (40 MG TOTAL) BY MOUTH DAILY AT 6 PM. 90 tablet 3   vitamin B-12 (CYANOCOBALAMIN) 100 MCG tablet Take 100 mcg by mouth daily.     No current facility-administered medications for this visit.    Allergies:   Doxazosin mesylate, Flomax [tamsulosin hcl], Penicillins, Sulfonamide derivatives, and Trulicity [dulaglutide]    Social History: Social History   Socioeconomic History   Marital status: Married    Spouse name: Not on file   Number of children: 1   Years of education: 12   Highest education level: High school graduate  Occupational History   Occupation: [employer]  Tobacco Use   Smoking status: Former    Types: Cigarettes    Quit date: 08/30/1983    Years since quitting: 37.6   Smokeless tobacco: Former   Tobacco comments:    Quit 30 years ago.  Vaping Use   Vaping Use: Never used  Substance and Sexual Activity  Alcohol use: Yes    Comment: rare   Drug use: No   Sexual activity: Yes  Other Topics Concern   Not on file  Social History Narrative   Not on file   Social Determinants of Health   Financial Resource Strain: Not on file  Food Insecurity: No Food Insecurity   Worried About Running Out of Food in the Last Year: Never true   Ran Out of Food in the Last Year: Never true  Transportation Needs: No Transportation Needs   Lack of Transportation (Medical): No   Lack of Transportation (Non-Medical): No  Physical Activity: Not on file  Stress: Not on file  Social Connections: Moderately Isolated   Frequency of Communication with Friends and Family: More than three times a week   Frequency of Social Gatherings with Friends and Family: More than three times a week   Attends Religious Services: Never   Marine scientist or Organizations: No   Attends Music therapist: Never   Marital Status: Married  Human resources officer Violence: Not on file    Family History: Family History  Problem Relation Age of Onset   Lung cancer Father    Allergic rhinitis Father    Coronary artery disease Mother    Colon cancer Neg Hx    Heart attack Neg Hx    Hypertension Neg Hx    Stroke Neg Hx    Esophageal cancer Neg Hx    Rectal cancer Neg Hx    Stomach cancer Neg Hx    Pancreatic cancer Neg Hx     Review of Systems: All other systems reviewed and are otherwise negative  except as noted above.   Physical Exam: Vitals:   04/09/21 0931  BP: 116/70  Pulse: 77  SpO2: 97%  Weight: 227 lb (103 kg)  Height: '5\' 11"'$  (1.803 m)     GEN- The patient is well appearing, alert and oriented x 3 today.   HEENT: normocephalic, atraumatic; sclera clear, conjunctiva pink; hearing intact; oropharynx clear; neck supple, no JVP Lymph- no cervical lymphadenopathy Lungs- Clear to ausculation bilaterally, normal work of breathing.  No wheezes, rales, rhonchi Heart- Regular rate and rhythm, no murmurs, rubs or gallops, PMI not laterally displaced GI- soft, non-tender, non-distended, bowel sounds present, no hepatosplenomegaly Extremities- no clubbing or cyanosis. No edema; DP/PT/radial pulses 2+ bilaterally MS- no significant deformity or atrophy Skin- warm and dry, no rash or lesion; ICD pocket well healed Psych- euthymic mood, full affect Neuro- strength and sensation are intact  ICD interrogation- reviewed in detail today,  See PACEART report  EKG:  EKG is not ordered today.  Recent Labs: 01/15/2021: Hemoglobin 15.2; Platelets 136 04/08/2021: ALT 28; BUN 11; Creat 0.83; Magnesium 2.0; Potassium 4.4; Sodium 136   Wt Readings from Last 3 Encounters:  04/09/21 227 lb (103 kg)  04/08/21 227 lb (103 kg)  03/31/21 224 lb 12.8 oz (102 kg)     Other studies Reviewed: Additional studies/ records that were reviewed today include: Previous EP office notes. Labs 8/11 at PCP.   Assessment and Plan:  1.  Chronic systolic dysfunction s/p Medtronic single chamber ICD  euvolemic today Stable on an appropriate medical regimen Normal ICD function See P[facility] Art report No changes today  2. VT No further apparent true VT.  Episode on device are short and appear aberrantly conducted SVT per MD review.  Increase coreg to 6.25 mg BID He is OK to drive now > 6 months since last VT.  3. CAD s/p CABG Denies ischemic symptoms  4. SVT He has had no further since 02/26/21. 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 (Mg was only 1.7 03/31/21, but normal 04/08/21 with addition of Mag ox by PCP)  He now only works 2-3 hours then comes in.  Stressed importance of hydration working outside  Current medicines are reviewed at length with the patient today.   The patient does not have concerns regarding his medicines.  The following changes were made today:  Increase coreg to 6.25 mg BID  Labs/ tests ordered today include:  Labs from 8/3 and 8/11 reviewed. Mg improvement noted with addition of Mg ox.   Disposition:   Follow up with Dr. Quentin Ore  12 months   Signed, Shirley Friar, PA-C  04/09/2021 9:48 AM  [facility] [address] [place] [address] [phone] (office) [phone] (fax)

## 2021-04-08 ENCOUNTER — Other Ambulatory Visit: Payer: Self-pay

## 2021-04-08 ENCOUNTER — Ambulatory Visit (INDEPENDENT_AMBULATORY_CARE_PROVIDER_SITE_OTHER): Payer: Medicare HMO | Admitting: Family Medicine

## 2021-04-08 ENCOUNTER — Encounter: Payer: Self-pay | Admitting: Family Medicine

## 2021-04-08 NOTE — Progress Notes (Signed)
Subjective:    Patient ID: Cameron Daniel, male    DOB: Jun 01, 1946, 75 y.o.   MRN: [id number]  HPI Patient recently was told by his cardiologist that his calcium was elevated at 10.3.  His magnesium was also low.  They started him on magnesium replacement and referred him to me for work-up for the hypercalcemia.  The patient has a history of kidney stones.  Looking back his calcium has been mildly elevated to 10.3-10.4 in the past.  He is not taking hydrochlorothiazide.  He is no longer taking any type of calcium supplement or vitamin D supplement.  He denies any chest pain shortness of breath dyspnea on exertion abdominal pain. Past Medical History:  Diagnosis Date   Allergic rhinitis, cause unspecified    Allergy    Anxiety    Arthritis    Cancer ([place])    Carotid Doppler 04/2020   Carotid US 9/21: No evidence of ICA stenosis bilaterally; right vertebral artery stenosis   Cataract    Chronic combined systolic (congestive) and diastolic (congestive) heart failure (HCC)    Coronary atherosclerosis of unspecified type of vessel, native or graft    a. MI 1985 with unclear details. b. CABG 03/2019 with LIMA -> LAD, SVG -> OM, SVG to dRCA.   Depression    PTSD    Diverticulosis of colon (without mention of hemorrhage)    Esophageal reflux    Esophageal stricture    Essential hypertension    Former tobacco use    Hemorrhoids    Hyperlipidemia    ICD (implantable cardioverter-defibrillator) in place    Irritable bowel syndrome    Ischemic cardiomyopathy    Mild carotid artery disease ([place])    a. 1-39% bilaterally 03/2019 duplex.   Myocardial infarction (HCC)    Nocturia    Obesity, unspecified    Orthostasis    Other acquired absence of organ    Personal history of colonic polyps    Psychosexual dysfunction with inhibited sexual excitement    Retroperitoneal bleed    Type II or unspecified type diabetes mellitus without mention of complication, not stated as uncontrolled    VT  (ventricular tachycardia) ([place])    Past Surgical History:  Procedure Laterality Date   ADENOIDECTOMY     CARPAL TUNNEL RELEASE     left    CHOLECYSTECTOMY     COLON RESECTION     18 inches   COLON SURGERY  2013   CORONARY ANGIOPLASTY     1985    CORONARY ARTERY BYPASS GRAFT N/A 04/26/2019   Procedure: CORONARY ARTERY BYPASS GRAFTING (CABG) x Three, using left internal mammary artery and right leg greater saphenous vein harvested endoscopically;  Surgeon: Gaye Pollack, MD;  Location: [place] ;  Service: Open Heart Surgery;  Laterality: N/A;   ELBOW BURSA SURGERY     EYE SURGERY     ICD IMPLANT N/A 06/25/2020   Procedure: ICD IMPLANT;  Surgeon: Vickie Epley, MD;  Location: [place] [facility];  Service: Cardiovascular;  Laterality: N/A;   LEFT HEART CATH AND CORS/GRAFTS ANGIOGRAPHY N/A 06/19/2020   Procedure: LEFT HEART CATH AND CORS/GRAFTS ANGIOGRAPHY;  Surgeon: Troy Sine, MD;  Location: [place] [facility];  Service: Cardiovascular;  Laterality: N/A;   POLYPECTOMY     RIGHT/LEFT HEART CATH AND CORONARY ANGIOGRAPHY N/A 04/16/2019   Procedure: RIGHT/LEFT HEART CATH AND CORONARY ANGIOGRAPHY;  Surgeon: Belva Crome, MD;  Location: [place] [facility];  Service: Cardiovascular;  Laterality: N/A;   SHOULDER SURGERY     Rt. Shoulder   TEE WITHOUT CARDIOVERSION N/A 04/26/2019   Procedure: TRANSESOPHAGEAL ECHOCARDIOGRAM (TEE);  Surgeon: Gaye Pollack, MD;  Location: [facility];  Service: Open Heart Surgery;  Laterality: N/A;   TONSILLECTOMY     V TACH ABLATION N/A 06/22/2020   Procedure: V TACH ABLATION;  Surgeon: Vickie Epley, MD;  Location: [place] [facility];  Service: Cardiovascular;  Laterality: N/A;   Current Outpatient Medications on File Prior to Visit  Medication Sig Dispense Refill   acetaminophen (TYLENOL) 500 MG tablet Take 1,000 mg by mouth at bedtime as needed for mild pain.     aspirin EC 81 MG tablet Take 1 tablet (81 mg total) by mouth daily. 90 tablet 3    azelastine (ASTELIN) 0.1 % nasal spray Place 2 sprays into both nostrils 2 (two) times daily. Use in each nostril as directed 30 mL 12   carvedilol (COREG) 3.125 MG tablet Take 3.125 mg by mouth 2 (two) times daily with a meal.     citalopram (CELEXA) 10 MG tablet Take 1 tablet (10 mg total) by mouth daily. 90 tablet 2   colestipol (COLESTID) 5 g packet Take 5 g by mouth daily.      fexofenadine (ALLEGRA) 180 MG tablet Take 1 tablet (180 mg total) by mouth daily as needed. For allergies 90 tablet 0   finasteride (PROSCAR) 5 MG tablet TAKE 1 TABLET BY MOUTH EVERY DAY 90 tablet 3   fluticasone (FLONASE) 50 MCG/ACT nasal spray SPRAY 2 SPRAYS INTO EACH NOSTRIL EVERY DAY 48 mL 2   JANUVIA 50 MG tablet TAKE 1 TABLET BY MOUTH EVERY DAY 30 tablet 5   JARDIANCE 25 MG TABS tablet TAKE 25 MG (1 TABLET) BY MOUTH DAILY BEFORE BREAKFAST. STOP PIOGLITAZONE 90 tablet 1   losartan (COZAAR) 25 MG tablet TAKE 1 TABLET BY MOUTH EVERY DAY 90 tablet 2   loteprednol (LOTEMAX) 0.5 % ophthalmic suspension Place 1 drop into both eyes 4 (four) times daily as needed (itching). 5 mL 1   magnesium oxide (MAG-OX) 400 MG tablet Take 1 tablet (400 mg total) by mouth 2 (two) times daily. 60 tablet 11   montelukast (SINGULAIR) 10 MG tablet Take 1 tablet (10 mg total) by mouth at bedtime. 30 tablet 3   OVER THE COUNTER MEDICATION Balance of Nature     pantoprazole (PROTONIX) 40 MG tablet TAKE 1 TABLET BY MOUTH EVERY DAY IN THE MORNING 90 tablet 3   rosuvastatin (CRESTOR) 40 MG tablet TAKE 1 TABLET (40 MG TOTAL) BY MOUTH DAILY AT 6 PM. 90 tablet 3   vitamin B-12 (CYANOCOBALAMIN) 100 MCG tablet Take 100 mcg by mouth daily.     No current facility-administered medications on file prior to visit.   Allergies  Allergen Reactions   Doxazosin Mesylate Other (See Comments)    Drops in blood pressure    Flomax [Tamsulosin Hcl] Itching and Other (See Comments)    Drops in blood pressure     Penicillins Rash and Other (See Comments)     Has patient had a PCN reaction causing immediate rash, facial/tongue/throat swelling, SOB or lightheadedness with hypotension: No Has patient had a PCN reaction causing severe rash involving mucus membranes or skin necrosis: No Has patient had a PCN reaction that required hospitalization No Has patient had a PCN reaction occurring within the last 10 years: No If all of the above answers are "NO", then may proceed with Cephalosporin  use.    Sulfonamide Derivatives Rash   Trulicity [Dulaglutide] Nausea And Vomiting    N&V, Dizziness   Social History   Socioeconomic History   Marital status: Married    Spouse name: Not on file   Number of children: 1   Years of education: 12   Highest education level: High school graduate  Occupational History   Occupation: [employer]s  Tobacco Use   Smoking status: Former    Types: Cigarettes    Quit date: 08/30/1983    Years since quitting: 37.6   Smokeless tobacco: Former   Tobacco comments:    Quit 30 years ago.  Vaping Use   Vaping Use: Never used  Substance and Sexual Activity   Alcohol use: Yes    Comment: rare   Drug use: No   Sexual activity: Yes  Other Topics Concern   Not on file  Social History Narrative   Not on file   Social Determinants of Health   Financial Resource Strain: Not on file  Food Insecurity: No Food Insecurity   Worried About Running Out of Food in the Last Year: Never true   Ran Out of Food in the Last Year: Never true  Transportation Needs: No Transportation Needs   Lack of Transportation (Medical): No   Lack of Transportation (Non-Medical): No  Physical Activity: Not on file  Stress: Not on file  Social Connections: Moderately Isolated   Frequency of Communication with Friends and Family: More than three times a week   Frequency of Social Gatherings with Friends and Family: More than three times a week   Attends Religious Services: Never   Marine scientist or Organizations: No   Attends Theatre manager Meetings: Never   Marital Status: Married  Human resources officer Violence: Not on file     Review of Systems     Objective:   Physical Exam Vitals reviewed.  Constitutional:      Appearance: Normal appearance.  Cardiovascular:     Rate and Rhythm: Normal rate and regular rhythm.     Pulses: Normal pulses.     Heart sounds: Normal heart sounds. No murmur heard. Pulmonary:     Effort: Pulmonary effort is normal. No respiratory distress.     Breath sounds: Normal breath sounds. No wheezing, rhonchi or rales.  Musculoskeletal:     Right lower leg: No edema.     Left lower leg: No edema.  Neurological:     Mental Status: He is alert.          Assessment & Plan:  Hypercalcemia - Plan: COMPLETE METABOLIC PANEL WITH GFR, Magnesium, PTH, Intact and Calcium  Hypomagnesemia - Plan: COMPLETE METABOLIC PANEL WITH GFR, Magnesium, PTH, Intact and Calcium I will repeat a magnesium level while the patient is here today.  I will also check a calcium level and a CMP along with a PTH to rule out hyperparathyroidism.  Consider checking a parathyroid related peptide if the PTH is normal.  Patient is not taking any vitamin D supplement.  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

## 2021-04-09 ENCOUNTER — Other Ambulatory Visit: Payer: Medicare HMO

## 2021-04-09 ENCOUNTER — Ambulatory Visit: Payer: Medicare HMO | Admitting: Student

## 2021-04-09 ENCOUNTER — Encounter: Payer: Self-pay | Admitting: Student

## 2021-04-09 VITALS — BP 116/70 | HR 77 | Ht 71.0 in | Wt 227.0 lb

## 2021-04-09 DIAGNOSIS — I472 Ventricular tachycardia, unspecified: Secondary | ICD-10-CM

## 2021-04-09 DIAGNOSIS — I5042 Chronic combined systolic (congestive) and diastolic (congestive) heart failure: Secondary | ICD-10-CM | POA: Diagnosis not present

## 2021-04-09 DIAGNOSIS — I2510 Atherosclerotic heart disease of native coronary artery without angina pectoris: Secondary | ICD-10-CM | POA: Diagnosis not present

## 2021-04-09 LAB — PTH, INTACT AND CALCIUM
Calcium: 10.5 mg/dL — ABNORMAL HIGH (ref 8.6–10.3)
PTH: 36 pg/mL (ref 16–77)

## 2021-04-09 LAB — COMPLETE METABOLIC PANEL WITH GFR
AG Ratio: 2.5 (calc) (ref 1.0–2.5)
ALT: 28 U/L (ref 9–46)
AST: 37 U/L — ABNORMAL HIGH (ref 10–35)
Albumin: 4.5 g/dL (ref 3.6–5.1)
Alkaline phosphatase (APISO): 78 U/L (ref 35–144)
BUN: 11 mg/dL (ref 7–25)
CO2: 27 mmol/L (ref 20–32)
Calcium: 10.5 mg/dL — ABNORMAL HIGH (ref 8.6–10.3)
Chloride: 100 mmol/L (ref 98–110)
Creat: 0.83 mg/dL (ref 0.70–1.28)
Globulin: 1.8 g/dL (calc) — ABNORMAL LOW (ref 1.9–3.7)
Glucose, Bld: 170 mg/dL — ABNORMAL HIGH (ref 65–99)
Potassium: 4.4 mmol/L (ref 3.5–5.3)
Sodium: 136 mmol/L (ref 135–146)
Total Bilirubin: 0.6 mg/dL (ref 0.2–1.2)
Total Protein: 6.3 g/dL (ref 6.1–8.1)
eGFR: 92 mL/min/{1.73_m2} (ref >=60)

## 2021-04-09 LAB — CUP PACEART INCLINIC DEVICE CHECK
Battery Remaining Longevity: 134 mo
Battery Voltage: 3.04 V
Brady Statistic RV Percent Paced: 0.01 %
Date Time Interrogation Session: 20220812094921
HighPow Impedance: 70 Ohm
Implantable Lead Implant Date: 20211028
Implantable Lead Location: 753860
Implantable Pulse Generator Implant Date: 20211028
Lead Channel Impedance Value: 437 Ohm
Lead Channel Impedance Value: 513 Ohm
Lead Channel Pacing Threshold Amplitude: 0.5 V
Lead Channel Pacing Threshold Pulse Width: 0.4 ms
Lead Channel Sensing Intrinsic Amplitude: 10.125 mV
Lead Channel Sensing Intrinsic Amplitude: 9.625 mV
Lead Channel Setting Pacing Amplitude: 2 V
Lead Channel Setting Pacing Pulse Width: 0.4 ms
Lead Channel Setting Sensing Sensitivity: 0.3 mV

## 2021-04-09 LAB — MAGNESIUM: Magnesium: 2 mg/dL (ref 1.5–2.5)

## 2021-04-09 MED ORDER — CARVEDILOL 6.25 MG PO TABS: 6.2500 mg | ORAL_TABLET | Freq: Two times a day (BID) | ORAL | 3 refills | Status: DC

## 2021-04-09 NOTE — Patient Instructions (Addendum)
Medication Instructions:  Your physician has recommended you make the following change in your medication:   INCREASE: Carvedilol to 6.'25mg'$  twice daily  *If you need a refill on your cardiac medications before your next appointment, please call your pharmacy*   Lab Work: None If you have labs (blood work) drawn today and your tests are completely normal, you will receive your results only by: [place] (if you have MyChart) OR A paper copy in the mail If you have any lab test that is abnormal or we need to change your treatment, we will call you to review the results.   Follow-Up: At [facility], you and your health needs are our priority.  As part of our continuing mission to provide you with exceptional heart care, we have created designated Provider Care Teams.  These Care Teams include your primary Cardiologist (physician) and Advanced Practice Providers (APPs -  Physician Assistants and Nurse Practitioners) who all work together to provide you with the care you need, when you need it.  We recommend signing up for the patient portal called "MyChart".  Sign up information is provided on this After Visit Summary.  MyChart is used to connect with patients for Virtual Visits (Telemedicine).  Patients are able to view lab/test results, encounter notes, upcoming appointments, etc.  Non-urgent messages can be sent to your provider as well.   To learn more about what you can do with MyChart, go to [email or web address].    Your next appointment:   1 year(s)  The format for your next appointment:   In Person  Provider:   You may see Vickie Epley, MD or one of the following Advanced Practice Providers on your designated Care Team:   Tommye Standard, [place] "[facility]" [place], [place]

## 2021-04-12 ENCOUNTER — Telehealth: Payer: Self-pay | Admitting: Pharmacist

## 2021-04-12 NOTE — Progress Notes (Addendum)
Chronic Care Management Pharmacy Assistant   Name: Cameron Daniel  MRN: [id number] DOB: 03/08/1946  Cameron Daniel is an 75 y.o. year old male who presents for his initial CCM visit with the clinical pharmacist.  Reason for Encounter: Chart Prep    Conditions to be addressed/monitored: HTN, CAD, GERD, Type 2 DM, HLD.  Primary concerns for visit include: HTN, Type 2 DM.   Recent office visits: 04/08/21 Dr. Dennard Schaumann For follow-up. STOPPED Nystatin and Omega.  02/12/21 Dr. Dennard Schaumann For seasonal allergies. STARTED Azelastine 0.1% 2 sprays, Olopatadine 0.1% 1 drop  01/22/21 Dr. Dennard Schaumann For seasonal allergies. STARTED Montelukast 10 mg daily bedtime.  12/16/20 Dr. Dennard Schaumann For neck pain. STARTED Diclofenac Sodium 2 g 4 times daily, Prednisone 20 mg 3 tabs pack. STOPPED Ascorbic acid and Cyanocobalamin.   Recent consult visits:  04/09/21 Cardiology Brenton Grills, [facility]. For follow-up. STOPPED Carvedilol 3.125 mg STARTED Carvedilol 6.25 mg 2 times daily.  03/31/21 Charlie Pitter, PA-C. For follow-up. STOPPED Diclofenac, Hydrocortisone, Ketotifen, Olopatadine, Prednisone, Nystatin 100000 units. 02/18/21 Chiropractic Medicine Iris Pert  02/17/21 Chiropractic Medicine Iris Pert  12/24/20 Cardiology Lars Mage T. No information given.  11/19/20 Orthopedic Surgery Fenton Foy D. No information given.  11/12/20 Orthopedic Surgery Edmisten, [place] No information given.  11/05/20 Orthopedic Surgery Fenton Foy D. No information given.  10/28/20 Orthopedic Surgery Whitfeld, Vonna Kotyk, MD. For right shoulder. No medication changes.  10/27/20 Cardiology Dorothy Spark, MD. For follow-up. STARTED Omega-3 Fatty Acids 1000 mg daily   Hospital visits:  None in previous 6 months  Medication History: Losartan 25 mg 90 DS 01/09/21 Rosuvastatin 40 mg 90 DS 03/04/21  Medications: Outpatient Encounter Medications as of 04/12/2021  Medication Sig   acetaminophen (TYLENOL) 500 MG  tablet Take 1,000 mg by mouth at bedtime as needed for mild pain.   aspirin EC 81 MG tablet Take 1 tablet (81 mg total) by mouth daily.   azelastine (ASTELIN) 0.1 % nasal spray Place 2 sprays into both nostrils 2 (two) times daily. Use in each nostril as directed   carvedilol (COREG) 6.25 MG tablet Take 1 tablet (6.25 mg total) by mouth 2 (two) times daily with a meal.   citalopram (CELEXA) 10 MG tablet Take 1 tablet (10 mg total) by mouth daily.   colestipol (COLESTID) 5 g packet Take 5 g by mouth daily.    fexofenadine (ALLEGRA) 180 MG tablet Take 1 tablet (180 mg total) by mouth daily as needed. For allergies   finasteride (PROSCAR) 5 MG tablet TAKE 1 TABLET BY MOUTH EVERY DAY   JANUVIA 50 MG tablet TAKE 1 TABLET BY MOUTH EVERY DAY   JARDIANCE 25 MG TABS tablet TAKE 25 MG (1 TABLET) BY MOUTH DAILY BEFORE BREAKFAST. STOP PIOGLITAZONE   losartan (COZAAR) 25 MG tablet TAKE 1 TABLET BY MOUTH EVERY DAY   loteprednol (LOTEMAX) 0.5 % ophthalmic suspension Place 1 drop into both eyes 4 (four) times daily as needed (itching).   magnesium oxide (MAG-OX) 400 MG tablet Take 1 tablet (400 mg total) by mouth 2 (two) times daily.   montelukast (SINGULAIR) 10 MG tablet Take 1 tablet (10 mg total) by mouth at bedtime.   OVER THE COUNTER MEDICATION Balance of Nature   pantoprazole (PROTONIX) 40 MG tablet TAKE 1 TABLET BY MOUTH EVERY DAY IN THE MORNING   rosuvastatin (CRESTOR) 40 MG tablet TAKE 1 TABLET (40 MG TOTAL) BY MOUTH DAILY AT 6 PM.   vitamin B-12 (CYANOCOBALAMIN) 100 MCG tablet  Take 100 mcg by mouth daily.   No facility-administered encounter medications on file as of 04/12/2021.    Have you seen any other providers since your last visit? Patient stated no.  Any changes in your medications or health? Patient stated no.   Any side effects from any medications? Patient stated no.   Do you have an symptoms or problems not managed by your medications? Patient stated no.   Any concerns about your  health right now? Patient stated no.  Has your provider asked that you check blood pressure, blood sugar, or follow special diet at home? Patient stated yes he does all three.   Do you get any type of exercise on a regular basis? Patient stated no.  Can you think of a goal you would like to reach for your health? Patient stated live longer.   Do you have any problems getting your medications? Patient stated Januvia.   Is there anything that you would like to discuss during the appointment? Patient stated no.   Please bring medications and supplements to appointment, patient reminded of his face to face 04/15/21 at 2 pm.   Follow-Up:Pharmacist Review Charlann Lange, [place] Pharmacist Assistant [phone]

## 2021-04-15 ENCOUNTER — Ambulatory Visit: Payer: Medicare HMO

## 2021-04-15 ENCOUNTER — Other Ambulatory Visit: Payer: Self-pay | Admitting: Family Medicine

## 2021-04-15 ENCOUNTER — Other Ambulatory Visit: Payer: Self-pay | Admitting: *Deleted

## 2021-04-16 ENCOUNTER — Other Ambulatory Visit: Payer: Self-pay | Admitting: Family Medicine

## 2021-04-20 NOTE — Progress Notes (Signed)
Remote ICD transmission.   

## 2021-04-30 ENCOUNTER — Telehealth: Payer: Medicare HMO

## 2021-04-30 ENCOUNTER — Ambulatory Visit (INDEPENDENT_AMBULATORY_CARE_PROVIDER_SITE_OTHER): Payer: Medicare HMO | Admitting: *Deleted

## 2021-04-30 DIAGNOSIS — E1169 Type 2 diabetes mellitus with other specified complication: Secondary | ICD-10-CM | POA: Diagnosis not present

## 2021-04-30 DIAGNOSIS — E785 Hyperlipidemia, unspecified: Secondary | ICD-10-CM | POA: Diagnosis not present

## 2021-04-30 DIAGNOSIS — I5022 Chronic systolic (congestive) heart failure: Secondary | ICD-10-CM | POA: Diagnosis not present

## 2021-04-30 NOTE — Patient Instructions (Signed)
Visit Information  PATIENT GOALS:  Goals Addressed             This Visit's Progress    Monitor and Manage My Blood Sugar-Diabetes Type 2       Timeframe:  Long-Range Goal Priority:  High Start Date:    02/22/2021                         Expected End Date:    08/24/2021                   Follow Up Date - 06/25/21   - check blood sugar at prescribed times - check blood sugar if I feel it is too high or too low - take the blood sugar log and meter to all doctor visits - practice portion control - be mindful of foods high in carbohydrates such as pasta, rice, bread, etc will elevate blood sugar - Continue exercising fishing , mowing and walking - expect a call to reschedule your visit with pharmacist   Why is this important?   Checking your blood sugar at home helps to keep it from getting very high or very low.  Writing the results in a diary or log helps the doctor know how to care for you.  Your blood sugar log should have the time, date and the results.  Also, write down the amount of insulin or other medicine that you take.  Other information, like what you ate, exercise done and how you were feeling, will also be helpful.     Notes:      Track and Manage Symptoms-Heart Failure       Timeframe:  Long-Range Goal Priority:  High Start Date:       02/22/2021                      Expected End Date:    08/24/2021                   Follow Up Date - 06/25/2021   - continue to follow heart failure action plan, be mindful of how you feel each day - eat more whole grains, fruits and vegetables, lean meats and healthy fats - follow rescue plan if symptoms flare-up and call your doctor early on for change in health status, symptoms - track symptoms and what helps feel better or worse  - follow a low salt diet- avoid salty snacks and fast food - continue to weigh daily- call doctor for weight gain of 3 pounds overnight or 5 pounds in one week - take all medications as prescribed    Why is this important?   You will be able to handle your symptoms better if you keep track of them.  Making some simple changes to your lifestyle will help.  Eating healthy is one thing you can do to take good care of yourself.    Notes:         The patient verbalized understanding of instructions, educational materials, and care plan provided today and declined offer to receive copy of patient instructions, educational materials, and care plan.   Telephone follow up appointment with care management team member scheduled for:  06/25/2021  Jacqlyn Larsen [facility], BSN RN Case Manager [place] [facility] [phone]

## 2021-04-30 NOTE — Chronic Care Management (AMB) (Signed)
Chronic Care Management   CCM RN Visit Note  04/30/2021 Name: Cameron Daniel MRN: [id number] DOB: 06-Feb-1946  Subjective: Cameron Daniel is a 75 y.o. year old male who is a primary care patient of Pickard, [name], MD. The care management team was consulted for assistance with disease management and care coordination needs.    Engaged with patient by telephone for follow up visit in response to provider referral for case management and/or care coordination services.   Consent to Services:  The patient was given information about Chronic Care Management services, agreed to services, and gave verbal consent prior to initiation of services.  Please see initial visit note for detailed documentation.   Patient agreed to services and verbal consent obtained.   Assessment: Review of patient past medical history, allergies, medications, health status, including review of consultants reports, laboratory and other test data, was performed as part of comprehensive evaluation and provision of chronic care management services.   SDOH (Social Determinants of Health) assessments and interventions performed:    CCM Care Plan  Allergies  Allergen Reactions   Doxazosin Mesylate Other (See Comments)    Drops in blood pressure    Flomax [Tamsulosin Hcl] Itching and Other (See Comments)    Drops in blood pressure     Penicillins Rash and Other (See Comments)    Has patient had a PCN reaction causing immediate rash, facial/tongue/throat swelling, SOB or lightheadedness with hypotension: No Has patient had a PCN reaction causing severe rash involving mucus membranes or skin necrosis: No Has patient had a PCN reaction that required hospitalization No Has patient had a PCN reaction occurring within the last 10 years: No If all of the above answers are "NO", then may proceed with Cephalosporin use.    Sulfonamide Derivatives Rash   Trulicity [Dulaglutide] Nausea And Vomiting    N&V, Dizziness     Outpatient Encounter Medications as of 04/30/2021  Medication Sig   acetaminophen (TYLENOL) 500 MG tablet Take 1,000 mg by mouth at bedtime as needed for mild pain.   aspirin EC 81 MG tablet Take 1 tablet (81 mg total) by mouth daily.   azelastine (ASTELIN) 0.1 % nasal spray Place 2 sprays into both nostrils 2 (two) times daily. Use in each nostril as directed   carvedilol (COREG) 6.25 MG tablet Take 1 tablet (6.25 mg total) by mouth 2 (two) times daily with a meal.   citalopram (CELEXA) 10 MG tablet Take 1 tablet (10 mg total) by mouth daily.   colestipol (COLESTID) 5 g packet Take 5 g by mouth daily.    fexofenadine (ALLEGRA) 180 MG tablet Take 1 tablet (180 mg total) by mouth daily as needed. For allergies   finasteride (PROSCAR) 5 MG tablet TAKE 1 TABLET BY MOUTH EVERY DAY   JANUVIA 50 MG tablet TAKE 1 TABLET BY MOUTH EVERY DAY   JARDIANCE 25 MG TABS tablet TAKE 25 MG (1 TABLET) BY MOUTH DAILY BEFORE BREAKFAST. STOP PIOGLITAZONE   losartan (COZAAR) 25 MG tablet TAKE 1 TABLET BY MOUTH EVERY DAY   loteprednol (LOTEMAX) 0.5 % ophthalmic suspension Place 1 drop into both eyes 4 (four) times daily as needed (itching).   magnesium oxide (MAG-OX) 400 MG tablet Take 1 tablet (400 mg total) by mouth 2 (two) times daily.   montelukast (SINGULAIR) 10 MG tablet TAKE 1 TABLET BY MOUTH EVERYDAY AT BEDTIME   OVER THE COUNTER MEDICATION Balance of Nature   pantoprazole (PROTONIX) 40 MG tablet TAKE 1 TABLET BY  MOUTH EVERY DAY IN THE MORNING   rosuvastatin (CRESTOR) 40 MG tablet TAKE 1 TABLET (40 MG TOTAL) BY MOUTH DAILY AT 6 PM.   vitamin B-12 (CYANOCOBALAMIN) 100 MCG tablet Take 100 mcg by mouth daily.   No facility-administered encounter medications on file as of 04/30/2021.    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   Hyponatremia 04/20/2019   Retroperitoneal hematoma 04/18/2019   Acute on chronic blood loss anemia 17/79/3903   Acute systolic HF (heart failure) ([place]) 04/15/2019   Non-ST elevation (NSTEMI) myocardial infarction ([place]) 04/15/2019   AKI (acute kidney injury) ([place])    Community acquired pneumonia of right lower lobe of lung 04/12/2019   Acute respiratory failure ([place]) 04/12/2019   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   Knee pain 06/07/2018   Allergic conjunctivitis 01/23/2018   Chronic sinusitis 01/23/2018   Personal history of other malignant neoplasm of skin 12/18/2014   Gout 04/06/2012   DIVERTICULITIS-COLON 06/18/2010   FACIAL FLUSHING 12/11/2007   NOCTURIA 12/11/2007   Type 2 diabetes mellitus with hyperlipidemia ([place]) 09/07/2007   OTHER MALAISE AND FATIGUE 09/07/2007   Allergic rhinitis 08/16/2007   HLD (hyperlipidemia) 01/24/2007   OBESITY 01/24/2007   ERECTILE DYSFUNCTION 01/24/2007   HYPERTENSION 01/24/2007   Coronary artery disease involving native coronary artery of native heart with angina pectoris ([place]) 01/24/2007   GERD 01/24/2007   DIVERTICULOSIS, COLON 01/24/2007   IRRITABLE BOWEL SYNDROME 01/24/2007   COLONIC POLYPS, HX OF 01/24/2007   CHOLECYSTECTOMY, HX OF 01/24/2007    Conditions to be addressed/monitored:CHF and DMII  Care Plan : Diabetes Type 2 (Adult)  Updates made by Kassie Mends, RN since 04/30/2021 12:00 AM     Problem: Glycemic Management (Diabetes, Type 2)   Priority: High     Long-Range Goal: Glycemic Management Optimized   Start Date: 02/22/2021  Expected End Date: 08/24/2021  This Visit's Progress: On track  Recent Progress: On track  Priority: High  Note:   Objective:  Lab Results  Component Value Date   HGBA1C 7.0 (H) 01/15/2021   Lab Results  Component Value Date   CREATININE 0.84 01/15/2021   CREATININE 0.83  07/17/2020   CREATININE 1.16 06/24/2020   No results found for: EGFR Current Barriers:  Knowledge Deficits related to basic Diabetes pathophysiology and self care/management- does not check CBG daily, checks 2-3 times per week with readings 120-130's range does not always adhere to ADA/ carbohydrate modified diet (sometimes eats fast food and drinks sugary soft drinks 1 per day), reports does walk and goes fishing, helps his son Raynelle Bring, is trying to remain active, 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 Difficulty obtaining or cannot afford medications- reports Celesta Gentile is $141.00/ per month and jardiance he is now getting from the [place], requests pharmacy assistance and reports he missed his face to face visit on 8/18 with pharmacist due to his wife was in the hospital, would like to reschedule. Case Manager Clinical Goal(s):  patient will demonstrate improved adherence to prescribed treatment plan for diabetes self care/management as evidenced by: adherence to ADA/ carb modified diet adherence to prescribed medication regimen contacting provider for new or worsened symptoms or questions Interventions:  Collaboration with Jenna Luo  T, MD regarding development and update of comprehensive plan of care as evidenced by provider attestation and co-signature Inter-disciplinary care team collaboration (see longitudinal plan of care) Reinforced patient about basic DM disease process and overall effect of uncontrolled blood sugar on the body. Reviewed medications with patient and discussed importance of medication adherence In basket sent to pharmacist Beverly Milch reporting pt wants to reschedule his face to face visit, is taking [place] but unable to afford and would like to discuss taking alogliptin 25 mg (this is what [facility] will [facility] instead of januvia) Reinforced plans with patient for ongoing care management follow up and provided patient with direct contact information for care  management team Reinforced management of  hypo and hyperglycemia and importance of correct treatment Reviewed scheduled/upcoming provider appointments  Review of patient status, including review of consultants reports, relevant laboratory and other test results, and medications completed. Reviewed ADA/ carbohydrate modified diet and plate method. Reinforced importance of checking CBG daily Self-Care Activities Attends all scheduled provider appointments Checks blood sugars as prescribed and utilize hyper and hypoglycemia protocol as needed Adheres to prescribed ADA/carb modified Patient Goals: - fill half of plate with vegetables - manage portion size - read food labels for fat, fiber, carbohydrates and portion size - check feet daily for cuts, sores or redness - wear comfortable, cotton socks - wear comfortable, well-fitting shoes- check blood sugar at prescribed times - check blood sugar at prescribed times - check blood sugar if I feel it is too high or too low - take the blood sugar log and meter to all doctor visits - practice portion control - be mindful of foods high in carbohydrates such as pasta, rice, bread, etc will elevate blood sugar - Continue exercising fishing , mowing and walking - expect a call to reschedule your visit with pharmacist Follow Up Plan: Telephone follow up appointment with care management team member scheduled for: 06/25/2021    Care Plan : Heart Failure (Adult)  Updates made by Kassie Mends, RN since 04/30/2021 12:00 AM     Problem: Symptom Exacerbation (Heart Failure)   Priority: High     Long-Range Goal: Symptom Exacerbation Prevented or Minimized   Start Date: 02/22/2021  Expected End Date: 08/24/2021  This Visit's Progress: On track  Recent Progress: On track  Priority: High  Note:   Current Barriers:  Knowledge deficit related to basic heart failure pathophysiology and self care management- needs reinforcement of HF action plan,  importance of symptom management Does not adhere to provider recommendations re:  does not always eat low sodium diet, eats fast food several times per week Pt reports he has scales and weighs daily with weight ranging 217-220,  gets outside to exercise (likes to walk and fish) Lives with spouse and reports independent in all aspects of his care 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 unsure about living will, documents mailed, pt will check to make sure he has Case Manager Clinical Goal(s):  patient will verbalize understanding of Heart Failure Action Plan and when to call doctor patient will take all Heart Failure mediations as prescribed patient will weigh daily and record (notifying MD of 3 lb weight gain over night or 5 lb in a week) Interventions:  Collaboration with [name], MD regarding development and update of comprehensive plan of care as evidenced by provider attestation and co-signature Inter-disciplinary care team collaboration (see longitudinal plan of care) pathophysiology of Heart Failure reviewed  Reinforced education on low sodium diet Reinforced  Heart Failure Action Plan in depth with emphasis on yellow zone Reinforced education about placing scale on hard, flat surface Reviewed with patient to weigh each morning after emptying bladder Reviewed importance of daily weight and advised patient to weigh and record daily Reviewed all upcoming doctor's appointments Encouraged pt to read over living will document and complete Self-Care Activities: Attends all scheduled provider appointments Calls pharmacy for medication refills Calls provider office for new concerns or questions Patient Goals: - continue to follow heart failure action plan, be mindful of how you feel each day - eat more whole grains, fruits and vegetables, lean meats and healthy fats - follow rescue plan if symptoms flare-up and call your doctor early on for change in health status, symptoms - track symptoms and  what helps feel better or worse  - follow a low salt diet- avoid salty snacks and fast food - continue to weigh daily- call doctor for weight gain of 3 pounds overnight or 5 pounds in one week - take all medications as prescribed Follow Up Plan: Telephone follow up appointment with care management team member scheduled for:  06/25/2021     Plan:Telephone follow up appointment with care management team member scheduled for:  06/25/2021  Jacqlyn Larsen [facility], BSN RN Case Manager [place] [facility] [phone]

## 2021-05-07 DIAGNOSIS — E119 Type 2 diabetes mellitus without complications: Secondary | ICD-10-CM | POA: Diagnosis not present

## 2021-05-20 ENCOUNTER — Other Ambulatory Visit: Payer: Self-pay | Admitting: Family Medicine

## 2021-05-24 DIAGNOSIS — E119 Type 2 diabetes mellitus without complications: Secondary | ICD-10-CM | POA: Diagnosis not present

## 2021-05-27 ENCOUNTER — Telehealth: Payer: Self-pay

## 2021-05-27 NOTE — Telephone Encounter (Signed)
Pt called in requesting to get a referral to a hand specialist. Pt states that his hands hurt really bad, and they have been keeping him awake at night. Please advise   Please call#:[phone] home, [phone] cell

## 2021-05-31 ENCOUNTER — Other Ambulatory Visit: Payer: Self-pay

## 2021-05-31 ENCOUNTER — Encounter: Payer: Self-pay | Admitting: Family Medicine

## 2021-05-31 ENCOUNTER — Ambulatory Visit (INDEPENDENT_AMBULATORY_CARE_PROVIDER_SITE_OTHER): Payer: Medicare HMO | Admitting: Family Medicine

## 2021-05-31 VITALS — BP 128/74 | HR 76 | Temp 98.1°F | Resp 62 | Ht 71.0 in | Wt 228.0 lb

## 2021-05-31 DIAGNOSIS — M79641 Pain in right hand: Secondary | ICD-10-CM | POA: Diagnosis not present

## 2021-05-31 DIAGNOSIS — G8929 Other chronic pain: Secondary | ICD-10-CM | POA: Diagnosis not present

## 2021-05-31 DIAGNOSIS — M79671 Pain in right foot: Secondary | ICD-10-CM | POA: Diagnosis not present

## 2021-05-31 DIAGNOSIS — M79642 Pain in left hand: Secondary | ICD-10-CM

## 2021-05-31 DIAGNOSIS — Z23 Encounter for immunization: Secondary | ICD-10-CM | POA: Diagnosis not present

## 2021-05-31 DIAGNOSIS — Z1211 Encounter for screening for malignant neoplasm of colon: Secondary | ICD-10-CM

## 2021-05-31 NOTE — Telephone Encounter (Signed)
Patient seen in office on 05/31/2021.

## 2021-05-31 NOTE — Progress Notes (Signed)
Subjective:    Patient ID: Cameron Daniel, male    DOB: Sep 13, 1945, 75 y.o.   MRN: [id number]  HPI  Patient presents today with several complaints.  #1 he reports an aching pain in both hands primarily at night.  The pain involves his PIP and DIP joints.  He states that his fifth digit hurts the least.  The most of the pain involves his thumbs his index finger and his third digit.  He also has some numbness and tingling in his fingertips.  He does have a history of carpal tunnel release performed several years ago.  However the majority of this pain seems to be pain and stiffness in the joints of hand.  He is unable to take NSAIDs due to his coronary artery disease.  He also complains of pain in his right flank.  He was fishing recently when he lost his balance and fell landing on a tackle box.  He bruised the right flank in the midaxillary line around the ninth rib.  He is slightly tender to palpation in that area.  He denies any cough or pleurisy or hemoptysis.  He does complain of bilateral foot pain.  He has arthritic changes in both ankles.  This causes his ankle joint to collapse inwardly causing the foot to hyperpronate.  This causes medial ankle pain.  Patient would benefit from arch supports to help support his longitudinal arch preventing the exacerbation of this joint pain.  He does complain of some neuropathy in both feet however he has palpable dorsalis pedis and posterior tibialis pulses bilaterally with normal sensation to 10 g monofilament in all 5 toes and in the balls of his feet. Past Medical History:  Diagnosis Date   Allergic rhinitis, cause unspecified    Allergy    Anxiety    Arthritis    Cancer ([place])    Carotid Doppler 04/2020   Carotid US 9/21: No evidence of ICA stenosis bilaterally; right vertebral artery stenosis   Cataract    Chronic combined systolic (congestive) and diastolic (congestive) heart failure (HCC)    Coronary atherosclerosis of unspecified type of  vessel, native or graft    a. MI 1985 with unclear details. b. CABG 03/2019 with LIMA -> LAD, SVG -> OM, SVG to dRCA.   Depression    PTSD    Diverticulosis of colon (without mention of hemorrhage)    Esophageal reflux    Esophageal stricture    Essential hypertension    Former tobacco use    Hemorrhoids    Hyperlipidemia    ICD (implantable cardioverter-defibrillator) in place    Irritable bowel syndrome    Ischemic cardiomyopathy    Mild carotid artery disease ([place])    a. 1-39% bilaterally 03/2019 duplex.   Myocardial infarction (HCC)    Nocturia    Obesity, unspecified    Orthostasis    Other acquired absence of organ    Personal history of colonic polyps    Psychosexual dysfunction with inhibited sexual excitement    Retroperitoneal bleed    Type II or unspecified type diabetes mellitus without mention of complication, not stated as uncontrolled    VT (ventricular tachycardia)    Past Surgical History:  Procedure Laterality Date   ADENOIDECTOMY     CARPAL TUNNEL RELEASE     left    CHOLECYSTECTOMY     COLON RESECTION     18 inches   COLON SURGERY  2013   CORONARY ANGIOPLASTY  1985    CORONARY ARTERY BYPASS GRAFT N/A 04/26/2019   Procedure: CORONARY ARTERY BYPASS GRAFTING (CABG) x Three, using left internal mammary artery and right leg greater saphenous vein harvested endoscopically;  Surgeon: Gaye Pollack, MD;  Location: [facility];  Service: Open Heart Surgery;  Laterality: N/A;   ELBOW BURSA SURGERY     EYE SURGERY     ICD IMPLANT N/A 06/25/2020   Procedure: ICD IMPLANT;  Surgeon: Vickie Epley, MD;  Location: [facility];  Service: Cardiovascular;  Laterality: N/A;   LEFT HEART CATH AND CORS/GRAFTS ANGIOGRAPHY N/A 06/19/2020   Procedure: LEFT HEART CATH AND CORS/GRAFTS ANGIOGRAPHY;  Surgeon: Troy Sine, MD;  Location: [facility];  Service: Cardiovascular;  Laterality: N/A;   POLYPECTOMY     RIGHT/LEFT HEART CATH AND CORONARY ANGIOGRAPHY N/A  04/16/2019   Procedure: RIGHT/LEFT HEART CATH AND CORONARY ANGIOGRAPHY;  Surgeon: Belva Crome, MD;  Location: [facility];  Service: Cardiovascular;  Laterality: N/A;   SHOULDER SURGERY     Rt. Shoulder   TEE WITHOUT CARDIOVERSION N/A 04/26/2019   Procedure: TRANSESOPHAGEAL ECHOCARDIOGRAM (TEE);  Surgeon: Gaye Pollack, MD;  Location: [facility];  Service: Open Heart Surgery;  Laterality: N/A;   TONSILLECTOMY     V TACH ABLATION N/A 06/22/2020   Procedure: V TACH ABLATION;  Surgeon: Vickie Epley, MD;  Location: [facility];  Service: Cardiovascular;  Laterality: N/A;   Current Outpatient Medications on File Prior to Visit  Medication Sig Dispense Refill   acetaminophen (TYLENOL) 500 MG tablet Take 1,000 mg by mouth at bedtime as needed for mild pain.     aspirin EC 81 MG tablet Take 1 tablet (81 mg total) by mouth daily. 90 tablet 3   azelastine (ASTELIN) 0.1 % nasal spray Place 2 sprays into both nostrils 2 (two) times daily. Use in each nostril as directed 30 mL 12   carvedilol (COREG) 6.25 MG tablet Take 1 tablet (6.25 mg total) by mouth 2 (two) times daily with a meal. 180 tablet 3   citalopram (CELEXA) 10 MG tablet Take 1 tablet (10 mg total) by mouth daily. 90 tablet 2   colestipol (COLESTID) 5 g packet Take 5 g by mouth daily.      fexofenadine (ALLEGRA) 180 MG tablet Take 1 tablet (180 mg total) by mouth daily as needed. For allergies 90 tablet 0   finasteride (PROSCAR) 5 MG tablet TAKE 1 TABLET BY MOUTH EVERY DAY 90 tablet 3   JANUVIA 50 MG tablet TAKE 1 TABLET BY MOUTH EVERY DAY 30 tablet 5   JARDIANCE 25 MG TABS tablet TAKE 25 MG (1 TABLET) BY MOUTH DAILY BEFORE BREAKFAST. STOP PIOGLITAZONE 90 tablet 1   losartan (COZAAR) 25 MG tablet TAKE 1 TABLET BY MOUTH EVERY DAY 90 tablet 2   loteprednol (LOTEMAX) 0.5 % ophthalmic suspension Place 1 drop into both eyes 4 (four) times daily as needed (itching). 5 mL 1   magnesium oxide (MAG-OX) 400 MG tablet Take 1 tablet (400  mg total) by mouth 2 (two) times daily. 60 tablet 11   montelukast (SINGULAIR) 10 MG tablet TAKE 1 TABLET BY MOUTH EVERYDAY AT BEDTIME 90 tablet 1   OVER THE COUNTER MEDICATION Balance of Nature     pantoprazole (PROTONIX) 40 MG tablet TAKE 1 TABLET BY MOUTH EVERY DAY IN THE MORNING 90 tablet 3   rosuvastatin (CRESTOR) 40 MG tablet TAKE 1 TABLET (40 MG TOTAL) BY MOUTH DAILY  AT 6 PM. 90 tablet 3   vitamin B-12 (CYANOCOBALAMIN) 100 MCG tablet Take 100 mcg by mouth daily.     No current facility-administered medications on file prior to visit.   Allergies  Allergen Reactions   Doxazosin Mesylate Other (See Comments)    Drops in blood pressure    Flomax [Tamsulosin Hcl] Itching and Other (See Comments)    Drops in blood pressure     Penicillins Rash and Other (See Comments)    Has patient had a PCN reaction causing immediate rash, facial/tongue/throat swelling, SOB or lightheadedness with hypotension: No Has patient had a PCN reaction causing severe rash involving mucus membranes or skin necrosis: No Has patient had a PCN reaction that required hospitalization No Has patient had a PCN reaction occurring within the last 10 years: No If all of the above answers are "NO", then may proceed with Cephalosporin use.    Sulfonamide Derivatives Rash   Trulicity [Dulaglutide] Nausea And Vomiting    N&V, Dizziness   Social History   Socioeconomic History   Marital status: Married    Spouse name: Not on file   Number of children: 1   Years of education: 12   Highest education level: High school graduate  Occupational History   Occupation: [employer]  Tobacco Use   Smoking status: Former    Types: Cigarettes    Quit date: 08/30/1983    Years since quitting: 37.7   Smokeless tobacco: Former   Tobacco comments:    Quit 30 years ago.  Vaping Use   Vaping Use: Never used  Substance and Sexual Activity   Alcohol use: Yes    Comment: rare   Drug use: No   Sexual activity: Yes  Other Topics  Concern   Not on file  Social History Narrative   Not on file   Social Determinants of Health   Financial Resource Strain: Not on file  Food Insecurity: No Food Insecurity   Worried About Running Out of Food in the Last Year: Never true   Ran Out of Food in the Last Year: Never true  Transportation Needs: No Transportation Needs   Lack of Transportation (Medical): No   Lack of Transportation (Non-Medical): No  Physical Activity: Not on file  Stress: Not on file  Social Connections: Moderately Isolated   Frequency of Communication with Friends and Family: More than three times a week   Frequency of Social Gatherings with Friends and Family: More than three times a week   Attends Religious Services: Never   Marine scientist or Organizations: No   Attends Archivist Meetings: Never   Marital Status: Married  Human resources officer Violence: Not on file     Review of Systems     Objective:   Physical Exam Vitals reviewed.  Constitutional:      Appearance: Normal appearance.  Cardiovascular:     Rate and Rhythm: Normal rate and regular rhythm.     Pulses: Normal pulses.     Heart sounds: Normal heart sounds. No murmur heard. Pulmonary:     Effort: Pulmonary effort is normal. No respiratory distress.     Breath sounds: Normal breath sounds. No wheezing, rhonchi or rales.  Musculoskeletal:     Right hand: Tenderness and bony tenderness present. Decreased range of motion.     Left hand: Tenderness and bony tenderness present. Decreased range of motion.     Right lower leg: No edema.     Left lower leg: No  edema.     Right ankle: Deformity present. Tenderness present over the medial malleolus.     Right foot: Normal range of motion and normal capillary refill. Tenderness present. No swelling, prominent metatarsal heads or crepitus.     Left foot: Normal range of motion and normal capillary refill. No swelling, tenderness or crepitus.  Neurological:     Mental  Status: He is alert.          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  He is unable to take NSAIDs due to his coronary artery disease.  Therefore I recommended diclofenac gel applying 2 g 4 times a day to the affected joint.  He is due for colon cancer screening however given his recent heart issues, he does not want to be put to sleep.  Therefore he elects to do Cologuard screening.  He has ankle pain distal to the medial malleolus.  There is a hyperpronation of his right ankle causing an inward turning of his great toe as he stands.  This causes medial ankle pain.  He would benefit from some arch supports to help compensate for this.  Therefore I will consult podiatry to discuss orthotics.

## 2021-06-08 DIAGNOSIS — Z1211 Encounter for screening for malignant neoplasm of colon: Secondary | ICD-10-CM | POA: Diagnosis not present

## 2021-06-10 ENCOUNTER — Ambulatory Visit: Payer: Medicare HMO | Admitting: Podiatry

## 2021-06-10 ENCOUNTER — Ambulatory Visit (INDEPENDENT_AMBULATORY_CARE_PROVIDER_SITE_OTHER): Payer: Medicare HMO

## 2021-06-10 ENCOUNTER — Ambulatory Visit: Payer: Medicare HMO

## 2021-06-10 ENCOUNTER — Other Ambulatory Visit: Payer: Self-pay

## 2021-06-10 DIAGNOSIS — M779 Enthesopathy, unspecified: Secondary | ICD-10-CM

## 2021-06-10 DIAGNOSIS — M79673 Pain in unspecified foot: Secondary | ICD-10-CM

## 2021-06-10 DIAGNOSIS — M216X2 Other acquired deformities of left foot: Secondary | ICD-10-CM

## 2021-06-10 DIAGNOSIS — M79671 Pain in right foot: Secondary | ICD-10-CM | POA: Diagnosis not present

## 2021-06-10 DIAGNOSIS — M216X1 Other acquired deformities of right foot: Secondary | ICD-10-CM

## 2021-06-10 NOTE — Patient Instructions (Signed)

## 2021-06-10 NOTE — Progress Notes (Signed)
See progress note.

## 2021-06-12 LAB — COLOGUARD: Cologuard: NEGATIVE

## 2021-06-14 ENCOUNTER — Encounter: Payer: Self-pay | Admitting: *Deleted

## 2021-06-14 NOTE — Progress Notes (Signed)
Subjective:   Patient ID: Cameron Daniel, male   DOB: 75 y.o.   MRN: [id number]   HPI 75 year old male presents the office today with concerns of right foot pain which is been getting worse over the years.  He states that the last 6 months have gotten worse and he feels his balance is off as his foot is turning.  He states when he first stands up his foot is " as stiff as a board" with walking he gets Some lower back pain.  He has a previous ankle injury 20 years ago but no recent injury.  He has noticed that his foot wants to roll when he walks.  At maximum his pain level is 7/10.  No swelling.  No recent treatment.  No other concerns.   ROS  Past Medical History:  Diagnosis Date   Allergic rhinitis, cause unspecified    Allergy    Anxiety    Arthritis    Cancer ([place])    Carotid Doppler 04/2020   Carotid US 9/21: No evidence of ICA stenosis bilaterally; right vertebral artery stenosis   Cataract    Chronic combined systolic (congestive) and diastolic (congestive) heart failure (HCC)    Coronary atherosclerosis of unspecified type of vessel, native or graft    a. MI 1985 with unclear details. b. CABG 03/2019 with LIMA -> LAD, SVG -> OM, SVG to dRCA.   Depression    PTSD    Diverticulosis of colon (without mention of hemorrhage)    Esophageal reflux    Esophageal stricture    Essential hypertension    Former tobacco use    Hemorrhoids    Hyperlipidemia    ICD (implantable cardioverter-defibrillator) in place    Irritable bowel syndrome    Ischemic cardiomyopathy    Mild carotid artery disease ([place])    a. 1-39% bilaterally 03/2019 duplex.   Myocardial infarction (HCC)    Nocturia    Obesity, unspecified    Orthostasis    Other acquired absence of organ    Personal history of colonic polyps    Psychosexual dysfunction with inhibited sexual excitement    Retroperitoneal bleed    Type II or unspecified type diabetes mellitus without mention of complication, not stated as  uncontrolled    VT (ventricular tachycardia)     Past Surgical History:  Procedure Laterality Date   ADENOIDECTOMY     CARPAL TUNNEL RELEASE     left    CHOLECYSTECTOMY     COLON RESECTION     18 inches   COLON SURGERY  2013   CORONARY ANGIOPLASTY     1985    CORONARY ARTERY BYPASS GRAFT N/A 04/26/2019   Procedure: CORONARY ARTERY BYPASS GRAFTING (CABG) x Three, using left internal mammary artery and right leg greater saphenous vein harvested endoscopically;  Surgeon: Gaye Pollack, MD;  Location: [facility];  Service: Open Heart Surgery;  Laterality: N/A;   ELBOW BURSA SURGERY     EYE SURGERY     ICD IMPLANT N/A 06/25/2020   Procedure: ICD IMPLANT;  Surgeon: Vickie Epley, MD;  Location: [facility];  Service: Cardiovascular;  Laterality: N/A;   LEFT HEART CATH AND CORS/GRAFTS ANGIOGRAPHY N/A 06/19/2020   Procedure: LEFT HEART CATH AND CORS/GRAFTS ANGIOGRAPHY;  Surgeon: Troy Sine, MD;  Location: [facility];  Service: Cardiovascular;  Laterality: N/A;   POLYPECTOMY     RIGHT/LEFT HEART CATH AND CORONARY ANGIOGRAPHY N/A 04/16/2019   Procedure: RIGHT/LEFT HEART  CATH AND CORONARY ANGIOGRAPHY;  Surgeon: Belva Crome, MD;  Location: [facility];  Service: Cardiovascular;  Laterality: N/A;   SHOULDER SURGERY     Rt. Shoulder   TEE WITHOUT CARDIOVERSION N/A 04/26/2019   Procedure: TRANSESOPHAGEAL ECHOCARDIOGRAM (TEE);  Surgeon: Gaye Pollack, MD;  Location: [facility];  Service: Open Heart Surgery;  Laterality: N/A;   TONSILLECTOMY     V TACH ABLATION N/A 06/22/2020   Procedure: V TACH ABLATION;  Surgeon: Vickie Epley, MD;  Location: [facility];  Service: Cardiovascular;  Laterality: N/A;     Current Outpatient Medications:    acetaminophen (TYLENOL) 500 MG tablet, Take 1,000 mg by mouth at bedtime as needed for mild pain., Disp: , Rfl:    aspirin EC 81 MG tablet, Take 1 tablet (81 mg total) by mouth daily., Disp: 90 tablet, Rfl: 3   azelastine  (ASTELIN) 0.1 % nasal spray, Place 2 sprays into both nostrils 2 (two) times daily. Use in each nostril as directed, Disp: 30 mL, Rfl: 12   carvedilol (COREG) 6.25 MG tablet, Take 1 tablet (6.25 mg total) by mouth 2 (two) times daily with a meal., Disp: 180 tablet, Rfl: 3   citalopram (CELEXA) 10 MG tablet, Take 1 tablet (10 mg total) by mouth daily., Disp: 90 tablet, Rfl: 2   colestipol (COLESTID) 5 g packet, Take 5 g by mouth daily. , Disp: , Rfl:    fexofenadine (ALLEGRA) 180 MG tablet, Take 1 tablet (180 mg total) by mouth daily as needed. For allergies, Disp: 90 tablet, Rfl: 0   finasteride (PROSCAR) 5 MG tablet, TAKE 1 TABLET BY MOUTH EVERY DAY, Disp: 90 tablet, Rfl: 3   JANUVIA 50 MG tablet, TAKE 1 TABLET BY MOUTH EVERY DAY, Disp: 30 tablet, Rfl: 5   JARDIANCE 25 MG TABS tablet, TAKE 25 MG (1 TABLET) BY MOUTH DAILY BEFORE BREAKFAST. STOP PIOGLITAZONE, Disp: 90 tablet, Rfl: 1   losartan (COZAAR) 25 MG tablet, TAKE 1 TABLET BY MOUTH EVERY DAY, Disp: 90 tablet, Rfl: 2   loteprednol (LOTEMAX) 0.5 % ophthalmic suspension, Place 1 drop into both eyes 4 (four) times daily as needed (itching)., Disp: 5 mL, Rfl: 1   magnesium oxide (MAG-OX) 400 MG tablet, Take 1 tablet (400 mg total) by mouth 2 (two) times daily., Disp: 60 tablet, Rfl: 11   montelukast (SINGULAIR) 10 MG tablet, TAKE 1 TABLET BY MOUTH EVERYDAY AT BEDTIME, Disp: 90 tablet, Rfl: 1   OVER THE COUNTER MEDICATION, Balance of Nature, Disp: , Rfl:    pantoprazole (PROTONIX) 40 MG tablet, TAKE 1 TABLET BY MOUTH EVERY DAY IN THE MORNING, Disp: 90 tablet, Rfl: 3   rosuvastatin (CRESTOR) 40 MG tablet, TAKE 1 TABLET (40 MG TOTAL) BY MOUTH DAILY AT 6 PM., Disp: 90 tablet, Rfl: 3   vitamin B-12 (CYANOCOBALAMIN) 100 MCG tablet, Take 100 mcg by mouth daily., Disp: , Rfl:   Allergies  Allergen Reactions   Doxazosin Mesylate Other (See Comments)    Drops in blood pressure    Flomax [Tamsulosin Hcl] Itching and Other (See Comments)    Drops in blood  pressure     Penicillins Rash and Other (See Comments)    Has patient had a PCN reaction causing immediate rash, facial/tongue/throat swelling, SOB or lightheadedness with hypotension: No Has patient had a PCN reaction causing severe rash involving mucus membranes or skin necrosis: No Has patient had a PCN reaction that required hospitalization No Has patient had a PCN reaction  occurring within the last 10 years: No If all of the above answers are "NO", then may proceed with Cephalosporin use.    Sulfonamide Derivatives Rash   Trulicity [Dulaglutide] Nausea And Vomiting    N&V, Dizziness          Objective:  Physical Exam  General: AAO x3, NAD  Dermatological: Skin is warm, dry and supple bilateral.  There are no open sores, no preulcerative lesions, no rash or signs of infection present.  Vascular: Dorsalis Pedis artery and Posterior Tibial artery pedal pulses are 2/4 bilateral with immedate capillary fill time.There is no pain with calf compression, swelling, warmth, erythema.   Neruologic: Grossly intact via light touch bilateral.   Musculoskeletal: There is a decreased medial arch upon weightbearing he does pronate.  Not able to elicit any specific area of pinpoint tenderness.  Does get some discomfort on medial aspect ankle along the course of the flexor tendons.  No area of pinpoint tenderness.  Ankle joint range of motion intact.  Muscular strength 5/5 in all groups tested bilateral.  No pain on the first MPJ.  Gait: Unassisted, Nonantalgic.       Assessment:   Pronation, capsulitis right foot     Plan:  -Treatment options discussed including all alternatives, risks, and complications -Etiology of symptoms were discussed -X-rays were obtained and reviewed with the patient.  Heel spurs are present.  Vessel calcification is also noted.  Calcifications also noted on the medial first MPJ. -I discussed with him orthotics.  He can consider custom inserts in his next  contact the office at the next month to get scheduled for this.  Discussed general stretching exercises.  Voltaren gel as needed.  Consider physical therapy as well.  Trula Slade DPM

## 2021-06-24 ENCOUNTER — Ambulatory Visit (INDEPENDENT_AMBULATORY_CARE_PROVIDER_SITE_OTHER): Payer: Medicare HMO

## 2021-06-24 DIAGNOSIS — I472 Ventricular tachycardia, unspecified: Secondary | ICD-10-CM | POA: Diagnosis not present

## 2021-06-24 DIAGNOSIS — I5022 Chronic systolic (congestive) heart failure: Secondary | ICD-10-CM

## 2021-06-25 ENCOUNTER — Telehealth: Payer: Medicare HMO

## 2021-06-25 LAB — CUP PACEART REMOTE DEVICE CHECK
Battery Remaining Longevity: 133 mo
Battery Voltage: 3 V
Brady Statistic RV Percent Paced: 0.01 %
Date Time Interrogation Session: 20221028074230
HighPow Impedance: 73 Ohm
Implantable Lead Implant Date: 20211028
Implantable Lead Location: 753860
Implantable Pulse Generator Implant Date: 20211028
Lead Channel Impedance Value: 437 Ohm
Lead Channel Impedance Value: 494 Ohm
Lead Channel Pacing Threshold Amplitude: 0.625 V
Lead Channel Pacing Threshold Pulse Width: 0.4 ms
Lead Channel Sensing Intrinsic Amplitude: 10.5 mV
Lead Channel Sensing Intrinsic Amplitude: 10.5 mV
Lead Channel Setting Pacing Amplitude: 2 V
Lead Channel Setting Pacing Pulse Width: 0.4 ms
Lead Channel Setting Sensing Sensitivity: 0.3 mV

## 2021-06-28 ENCOUNTER — Other Ambulatory Visit: Payer: Self-pay | Admitting: Podiatry

## 2021-06-28 DIAGNOSIS — M779 Enthesopathy, unspecified: Secondary | ICD-10-CM

## 2021-06-29 ENCOUNTER — Telehealth: Payer: Self-pay | Admitting: Podiatry

## 2021-06-29 NOTE — Telephone Encounter (Signed)
Pt left message stating he was seen in October and pt was told to call back at the beginning of November to schedule an appt. Pt was seen in October and in the note it talks about orthotics but pt is diabetic and I was not sure if you felt that the pt should stick to orthotics(Not covered by insurance([employer]) or if you think it would be ok to go with Diabetic shoes and inserts?  I discussed with pt and told pt I would send Dr Jacqualyn Posey a message and see which route he would recommend.

## 2021-06-30 DIAGNOSIS — E119 Type 2 diabetes mellitus without complications: Secondary | ICD-10-CM | POA: Diagnosis not present

## 2021-07-01 DIAGNOSIS — M1711 Unilateral primary osteoarthritis, right knee: Secondary | ICD-10-CM | POA: Diagnosis not present

## 2021-07-01 NOTE — Progress Notes (Signed)
Remote ICD transmission.   

## 2021-07-07 ENCOUNTER — Telehealth: Payer: Self-pay | Admitting: Podiatry

## 2021-07-07 NOTE — Telephone Encounter (Signed)
Pt returned my call and is scheduled for casting next Tuesday 11.15 with Dr Valentina Lucks for diabetic shoes and inserts.

## 2021-07-07 NOTE — Telephone Encounter (Signed)
Left message for pt to call to get scheduled on casting schedule for diabetic shoes as per Dr Jacqualyn Posey said we could try them.

## 2021-07-08 DIAGNOSIS — M1711 Unilateral primary osteoarthritis, right knee: Secondary | ICD-10-CM | POA: Diagnosis not present

## 2021-07-09 ENCOUNTER — Ambulatory Visit (INDEPENDENT_AMBULATORY_CARE_PROVIDER_SITE_OTHER): Payer: Medicare HMO | Admitting: *Deleted

## 2021-07-09 DIAGNOSIS — I5022 Chronic systolic (congestive) heart failure: Secondary | ICD-10-CM

## 2021-07-09 DIAGNOSIS — E1169 Type 2 diabetes mellitus with other specified complication: Secondary | ICD-10-CM

## 2021-07-09 DIAGNOSIS — E785 Hyperlipidemia, unspecified: Secondary | ICD-10-CM

## 2021-07-09 NOTE — Patient Instructions (Signed)
Visit Information  Patient will self administer medications as prescribed as evidenced by self report/primary caregiver report  Patient will attend all scheduled provider appointments as evidenced by clinician review of documented attendance to scheduled appointments and patient/caregiver report Patient will continue to perform ADL's independently as evidenced by patient/caregiver report Patient will continue to perform IADL's independently as evidenced by patient/caregiver report Patient will call provider office for new concerns or questions as evidenced by review of documented incoming telephone call notes and patient report call office if I gain more than 2 pounds in one day or 5 pounds in one week keep legs up while sitting track weight in diary watch for swelling in feet, ankles and legs every day weigh myself daily follow rescue plan if symptoms flare-up eat more whole grains, fruits and vegetables, lean meats and healthy fats check blood sugar at prescribed times: per instructions by primary care provider  check feet daily for cuts, sores or redness enter blood sugar readings and medication or insulin into daily log take the blood sugar log to all doctor visits take the blood sugar meter to all doctor visits fill half of plate with vegetables  The patient verbalized understanding of instructions, educational materials, and care plan provided today and declined offer to receive copy of patient instructions, educational materials, and care plan.   Telephone follow up appointment with care management team member scheduled for:  Jacqlyn Larsen [facility], BSN RN Case Manager [facility] [phone]

## 2021-07-09 NOTE — Chronic Care Management (AMB) (Signed)
Chronic Care Management   CCM RN Visit Note  07/09/2021 Name: Cameron Daniel MRN: [id number] DOB: [birth year]  Subjective: Cameron Daniel is a 75 y.o. year old male who is a primary care patient of Pickard, [name], MD. The care management team was consulted for assistance with disease management and care coordination needs.    Engaged with patient by telephone for follow up visit in response to provider referral for case management and/or care coordination services.   Consent to Services:  The patient was given information about Chronic Care Management services, agreed to services, and gave verbal consent prior to initiation of services.  Please see initial visit note for detailed documentation.   Patient agreed to services and verbal consent obtained.   Assessment: Review of patient past medical history, allergies, medications, health status, including review of consultants reports, laboratory and other test data, was performed as part of comprehensive evaluation and provision of chronic care management services.   SDOH (Social Determinants of Health) assessments and interventions performed:    CCM Care Plan  Allergies  Allergen Reactions   Doxazosin Mesylate Other (See Comments)    Drops in blood pressure    Flomax [Tamsulosin Hcl] Itching and Other (See Comments)    Drops in blood pressure     Penicillins Rash and Other (See Comments)    Has patient had a PCN reaction causing immediate rash, facial/tongue/throat swelling, SOB or lightheadedness with hypotension: No Has patient had a PCN reaction causing severe rash involving mucus membranes or skin necrosis: No Has patient had a PCN reaction that required hospitalization No Has patient had a PCN reaction occurring within the last 10 years: No If all of the above answers are "NO", then may proceed with Cephalosporin use.    Sulfonamide Derivatives Rash   Trulicity [Dulaglutide] Nausea And Vomiting    N&V, Dizziness     Outpatient Encounter Medications as of 07/09/2021  Medication Sig   acetaminophen (TYLENOL) 500 MG tablet Take 1,000 mg by mouth at bedtime as needed for mild pain.   aspirin EC 81 MG tablet Take 1 tablet (81 mg total) by mouth daily.   azelastine (ASTELIN) 0.1 % nasal spray Place 2 sprays into both nostrils 2 (two) times daily. Use in each nostril as directed   carvedilol (COREG) 6.25 MG tablet Take 1 tablet (6.25 mg total) by mouth 2 (two) times daily with a meal.   citalopram (CELEXA) 10 MG tablet Take 1 tablet (10 mg total) by mouth daily.   colestipol (COLESTID) 5 g packet Take 5 g by mouth daily.    fexofenadine (ALLEGRA) 180 MG tablet Take 1 tablet (180 mg total) by mouth daily as needed. For allergies   finasteride (PROSCAR) 5 MG tablet TAKE 1 TABLET BY MOUTH EVERY DAY   JANUVIA 50 MG tablet TAKE 1 TABLET BY MOUTH EVERY DAY   JARDIANCE 25 MG TABS tablet TAKE 25 MG (1 TABLET) BY MOUTH DAILY BEFORE BREAKFAST. STOP PIOGLITAZONE   losartan (COZAAR) 25 MG tablet TAKE 1 TABLET BY MOUTH EVERY DAY   loteprednol (LOTEMAX) 0.5 % ophthalmic suspension Place 1 drop into both eyes 4 (four) times daily as needed (itching).   magnesium oxide (MAG-OX) 400 MG tablet Take 1 tablet (400 mg total) by mouth 2 (two) times daily.   montelukast (SINGULAIR) 10 MG tablet TAKE 1 TABLET BY MOUTH EVERYDAY AT BEDTIME   OVER THE COUNTER MEDICATION Balance of Nature   pantoprazole (PROTONIX) 40 MG tablet TAKE 1 TABLET BY  MOUTH EVERY DAY IN THE MORNING   rosuvastatin (CRESTOR) 40 MG tablet TAKE 1 TABLET (40 MG TOTAL) BY MOUTH DAILY AT 6 PM.   vitamin B-12 (CYANOCOBALAMIN) 100 MCG tablet Take 100 mcg by mouth daily.   No facility-administered encounter medications on file as of 07/09/2021.    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   Hyponatremia 04/20/2019   Retroperitoneal hematoma 04/18/2019   Acute on chronic blood loss anemia 48/54/6270   Acute systolic HF (heart failure) ([place]) 04/15/2019   Non-ST elevation (NSTEMI) myocardial infarction ([place]) 04/15/2019   AKI (acute kidney injury) ([place])    Community acquired pneumonia of right lower lobe of lung 04/12/2019   Acute respiratory failure ([place]) 04/12/2019   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   Knee pain 06/07/2018   Allergic conjunctivitis 01/23/2018   Chronic sinusitis 01/23/2018   Personal history of other malignant neoplasm of skin 12/18/2014   Gout 04/06/2012   DIVERTICULITIS-COLON 06/18/2010   FACIAL FLUSHING 12/11/2007   NOCTURIA 12/11/2007   Type 2 diabetes mellitus with hyperlipidemia ([place]) 09/07/2007   OTHER MALAISE AND FATIGUE 09/07/2007   Allergic rhinitis 08/16/2007   HLD (hyperlipidemia) 01/24/2007   OBESITY 01/24/2007   ERECTILE DYSFUNCTION 01/24/2007   HYPERTENSION 01/24/2007   Coronary artery disease involving native coronary artery of native heart with angina pectoris ([place]) 01/24/2007   GERD 01/24/2007   DIVERTICULOSIS, COLON 01/24/2007   IRRITABLE BOWEL SYNDROME 01/24/2007   COLONIC POLYPS, HX OF 01/24/2007   CHOLECYSTECTOMY, HX OF 01/24/2007    Conditions to be addressed/monitored:CHF and DMII            Care Plan : RN Care Manager plan of care  Updates made by Kassie Mends, RN since 07/09/2021 12:00 AM     Problem: No plan of care established for managment of chronic disease states (CHF, DM2, CAD, HTN)   Priority: High     Long-Range Goal: Development of plan of care for chronic disease management (CHF, DM2, CAD, HTN)   Start Date: 07/09/2021  Expected End Date: 01/05/2022  Priority: High  Note:    Current Barriers:  Knowledge Deficits related to plan of care for management of CHF and DMII - patient  needs continued reinforcement of management of CHF, DM2 with emphasis on HF action plan, diabetes management and diet. Does not adhere to provider recommendations re:  does not always eat low sodium diet, eats fast food several times per week Pt reports he has scales and weighs daily with weight ranging the same at 217-220,  gets outside to exercise (likes to walk and fish), reports CBG ranges 120-140, reports recently got diabetic shoes and will be having scan/ casting due to issues with right ankle Lives with spouse and reports independent in all aspects of his care RNCM Clinical Goal(s):  Patient will verbalize understanding of plan for management of CHF and HTN as evidenced by review EHR, collaboration with care team and pt report attend all scheduled medical appointments: 07/13/21 casting/ scan  as evidenced by review EHR and pt report        through collaboration with RN Care manager, provider, and care team.   Interventions: 1:1 collaboration with primary care provider regarding development and  update of comprehensive plan of care as evidenced by provider attestation and co-signature Inter-disciplinary care team collaboration (see longitudinal plan of care) Evaluation of current treatment plan related to  self management and patient's adherence to plan as established by provider Diabetes Interventions: Assessed patient's understanding of A1c goal: <7% Reviewed medications with patient and discussed importance of medication adherence; Counseled on importance of regular laboratory monitoring as prescribed; Discussed plans with patient for ongoing care management follow up and provided patient with direct contact information for care management team; Reviewed scheduled/upcoming provider appointments including: 07/13/21 casting/ scan (due to issue with right ankle); Review of patient status, including review of consultants reports, relevant laboratory and other test results, and medications  completed; Pain assessment completed Lab Results  Component Value Date   HGBA1C 7.0 (H) 01/15/2021  Heart Failure Interventions: Discussed importance of daily weight and advised patient to weigh and record daily; Discussed the importance of keeping all appointments with provider; Provided patient with education about the role of exercise in the management of heart failure;  Reinforced low sodium diet and food choices Reinforced heart failure action plan Patient Goals/Self-Care Activities: Patient will self administer medications as prescribed as evidenced by self report/primary caregiver report  Patient will attend all scheduled provider appointments as evidenced by clinician review of documented attendance to scheduled appointments and patient/caregiver report Patient will continue to perform ADL's independently as evidenced by patient/caregiver report Patient will continue to perform IADL's independently as evidenced by patient/caregiver report Patient will call provider office for new concerns or questions as evidenced by review of documented incoming telephone call notes and patient report call office if I gain more than 2 pounds in one day or 5 pounds in one week keep legs up while sitting track weight in diary watch for swelling in feet, ankles and legs every day weigh myself daily follow rescue plan if symptoms flare-up eat more whole grains, fruits and vegetables, lean meats and healthy fats check blood sugar at prescribed times: per instructions by primary care provider  check feet daily for cuts, sores or redness enter blood sugar readings and medication or insulin into daily log take the blood sugar log to all doctor visits take the blood sugar meter to all doctor visits fill half of plate with vegetables  Follow Up Plan:  Telephone follow up appointment with care management team member scheduled for:  09/10/2021      Plan:Telephone follow up appointment with care  management team member scheduled for:  09/10/2021   Jacqlyn Larsen [facility], BSN RN Case Manager [facility] [phone]

## 2021-07-12 DIAGNOSIS — Z23 Encounter for immunization: Secondary | ICD-10-CM | POA: Diagnosis not present

## 2021-07-13 ENCOUNTER — Ambulatory Visit (INDEPENDENT_AMBULATORY_CARE_PROVIDER_SITE_OTHER): Payer: Medicare HMO | Admitting: Podiatrist

## 2021-07-13 ENCOUNTER — Other Ambulatory Visit: Payer: Self-pay

## 2021-07-13 DIAGNOSIS — M76821 Posterior tibial tendinitis, right leg: Secondary | ICD-10-CM

## 2021-07-13 DIAGNOSIS — E1142 Type 2 diabetes mellitus with diabetic polyneuropathy: Secondary | ICD-10-CM

## 2021-07-13 NOTE — Progress Notes (Signed)
   HPI: Patient is 75 y.o. male who presents today for Diabetic foot exam and to be measured for diabetic shoes and accomidative inserts.  He relates he has had diabetic shoes in the past however they are worn out and he is due for another pair.  He relates foot pain especially his right foot arch and ankle as well as numbness and tingling in his hands and feet.   The patients primary care physician is Dr. Jenna Luo, MD.     Allergies  Allergen Reactions   Doxazosin Mesylate Other (See Comments)    Drops in blood pressure    Flomax [Tamsulosin Hcl] Itching and Other (See Comments)    Drops in blood pressure     Penicillins Rash and Other (See Comments)    Has patient had a PCN reaction causing immediate rash, facial/tongue/throat swelling, SOB or lightheadedness with hypotension: No Has patient had a PCN reaction causing severe rash involving mucus membranes or skin necrosis: No Has patient had a PCN reaction that required hospitalization No Has patient had a PCN reaction occurring within the last 10 years: No If all of the above answers are "NO", then may proceed with Cephalosporin use.    Sulfonamide Derivatives Rash   Trulicity [Dulaglutide] Nausea And Vomiting    N&V, Dizziness    Review of systems is negative except as noted in the HPI.  Denies nausea/ vomiting/ fevers/ chills or night sweats.   Denies difficulty breathing, denies calf pain or tenderness  Physical Exam  Patient is awake, alert, and oriented x 3.  In no acute distress.    Vascular status is intact with palpable pedal pulses DP and PT bilateral and capillary refill time less than 3 seconds bilateral.  No edema or erythema noted.   Neurological exam reveals decreased protective sensation via 10gm monofilament.  Senasation found to be decreased submet 1 and 5 bilateral as well as bilateral heels.  Subjective reports of tingling and numbness also confirmed.   Dermatological exam reveals skin is supple and dry  to bilateral feet.  No open lesions present.    Musculoskeletal exam: pes planus foot type is noted right greater than left.  Pain along the posterior tibial tendon at its insertion and proximally to the medial malleolus is noted.  Upon standing, significant pronation occurs right greater than left.   Assessment:   ICD-10-CM   1. Type 2 diabetes mellitus with peripheral neuropathy (HCC)  E11.42     2. Posterior tibial tendon dysfunction, right  M76.821        Plan: Discussed treatment options and alternatives.  Discussed we could try some extra depth custom molded diabetic inserts with diabetic shoes to see if this would be enough to control the pronation on his right foot.  He was casted for diabetic inserts and measured for diabetic shoes at todays visit.  Discussed if he still has pain in the posterior tibial tendon after wearing the shoes and inserts, the next recommendation would be a brace for the right foot and ankle.  He would like to try the shoes and inserts first and will go from there.   Size 11Wide Apex V753 shoes will be ordered along with 3 pr custom diabetic insert.  We will call when they are ready to be picked up and fitted to the patient.

## 2021-07-15 ENCOUNTER — Telehealth: Payer: Self-pay | Admitting: Family Medicine

## 2021-07-15 DIAGNOSIS — M1711 Unilateral primary osteoarthritis, right knee: Secondary | ICD-10-CM | POA: Diagnosis not present

## 2021-07-15 NOTE — Telephone Encounter (Signed)
Left message for patient to call back and schedule Medicare Annual Wellness Visit (AWV) in office.  ° °If not able to come in office, please offer to do virtually or by telephone.  Left office number and my jabber #[phone]. ° °Last AWV:02/15/2019 ° °Please schedule at anytime with Nurse Health Advisor. °  °

## 2021-07-28 DIAGNOSIS — E1169 Type 2 diabetes mellitus with other specified complication: Secondary | ICD-10-CM | POA: Diagnosis not present

## 2021-07-28 DIAGNOSIS — E785 Hyperlipidemia, unspecified: Secondary | ICD-10-CM | POA: Diagnosis not present

## 2021-07-28 DIAGNOSIS — I5022 Chronic systolic (congestive) heart failure: Secondary | ICD-10-CM

## 2021-07-30 ENCOUNTER — Ambulatory Visit (INDEPENDENT_AMBULATORY_CARE_PROVIDER_SITE_OTHER): Payer: Medicare HMO | Admitting: Family Medicine

## 2021-07-30 ENCOUNTER — Encounter: Payer: Self-pay | Admitting: Family Medicine

## 2021-07-30 ENCOUNTER — Other Ambulatory Visit: Payer: Self-pay

## 2021-07-30 VITALS — BP 120/68 | HR 68 | Resp 18 | Ht 71.0 in | Wt 228.0 lb

## 2021-07-30 DIAGNOSIS — E785 Hyperlipidemia, unspecified: Secondary | ICD-10-CM

## 2021-07-30 DIAGNOSIS — E1169 Type 2 diabetes mellitus with other specified complication: Secondary | ICD-10-CM | POA: Diagnosis not present

## 2021-07-30 DIAGNOSIS — I5022 Chronic systolic (congestive) heart failure: Secondary | ICD-10-CM | POA: Diagnosis not present

## 2021-07-30 DIAGNOSIS — Z951 Presence of aortocoronary bypass graft: Secondary | ICD-10-CM

## 2021-07-30 MED ORDER — DIPHENOXYLATE-ATROPINE 2.5-0.025 MG PO TABS: 2.0000 | ORAL_TABLET | Freq: Four times a day (QID) | ORAL | 0 refills | Status: DC | PRN

## 2021-07-30 MED ORDER — PREDNISONE 20 MG PO TABS: 40.0000 mg | ORAL_TABLET | Freq: Every day | ORAL | 0 refills | Status: DC
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
## 2021-07-31 LAB — CBC WITH DIFFERENTIAL/PLATELET
Absolute Monocytes: 630 cells/uL (ref 200–950)
Basophils Absolute: 30 cells/uL (ref 0–200)
Basophils Relative: 0.4 %
Eosinophils Absolute: 83 cells/uL (ref 15–500)
Eosinophils Relative: 1.1 %
HCT: 48.2 % (ref 38.5–50.0)
Hemoglobin: 16.4 g/dL (ref 13.2–17.1)
Lymphs Abs: 1928 cells/uL (ref 850–3900)
MCH: 28.9 pg (ref 27.0–33.0)
MCHC: 34 g/dL (ref 32.0–36.0)
MCV: 84.9 fL (ref 80.0–100.0)
MPV: 10.8 fL (ref 7.5–12.5)
Monocytes Relative: 8.4 %
Neutro Abs: 4830 cells/uL (ref 1500–7800)
Neutrophils Relative %: 64.4 %
Platelets: 171 10*3/uL (ref 140–400)
RBC: 5.68 10*6/uL (ref 4.20–5.80)
RDW: 14.3 % (ref 11.0–15.0)
Total Lymphocyte: 25.7 %
WBC: 7.5 10*3/uL (ref 3.8–10.8)

## 2021-07-31 LAB — HEMOGLOBIN A1C
Hgb A1c MFr Bld: 7 % of total Hgb — ABNORMAL HIGH (ref <5.7)
Mean Plasma Glucose: 154 mg/dL
eAG (mmol/L): 8.5 mmol/L

## 2021-07-31 LAB — COMPLETE METABOLIC PANEL WITH GFR
AG Ratio: 2.1 (calc) (ref 1.0–2.5)
ALT: 29 U/L (ref 9–46)
AST: 34 U/L (ref 10–35)
Albumin: 4.4 g/dL (ref 3.6–5.1)
Alkaline phosphatase (APISO): 80 U/L (ref 35–144)
BUN: 14 mg/dL (ref 7–25)
CO2: 30 mmol/L (ref 20–32)
Calcium: 10.2 mg/dL (ref 8.6–10.3)
Chloride: 100 mmol/L (ref 98–110)
Creat: 0.82 mg/dL (ref 0.70–1.28)
Globulin: 2.1 g/dL (calc) (ref 1.9–3.7)
Glucose, Bld: 123 mg/dL — ABNORMAL HIGH (ref 65–99)
Potassium: 4.3 mmol/L (ref 3.5–5.3)
Sodium: 136 mmol/L (ref 135–146)
Total Bilirubin: 0.7 mg/dL (ref 0.2–1.2)
Total Protein: 6.5 g/dL (ref 6.1–8.1)
eGFR: 92 mL/min/{1.73_m2} (ref >=60)

## 2021-07-31 LAB — LIPID PANEL
Cholesterol: 121 mg/dL (ref <200)
HDL: 48 mg/dL (ref >=40)
LDL Cholesterol (Calc): 45 mg/dL (calc)
Non-HDL Cholesterol (Calc): 73 mg/dL (calc) (ref <130)
Total CHOL/HDL Ratio: 2.5 (calc) (ref <5.0)
Triglycerides: 224 mg/dL — ABNORMAL HIGH (ref <150)

## 2021-07-31 LAB — MICROALBUMIN, URINE: Microalb, Ur: 0.8 mg/dL

## 2021-08-16 ENCOUNTER — Telehealth: Payer: Self-pay | Admitting: Internal Medicine

## 2021-08-16 ENCOUNTER — Other Ambulatory Visit: Payer: Self-pay

## 2021-08-16 ENCOUNTER — Ambulatory Visit: Payer: Medicare HMO

## 2021-08-16 DIAGNOSIS — E1142 Type 2 diabetes mellitus with diabetic polyneuropathy: Secondary | ICD-10-CM

## 2021-08-16 DIAGNOSIS — M2141 Flat foot [pes planus] (acquired), right foot: Secondary | ICD-10-CM | POA: Diagnosis not present

## 2021-08-16 DIAGNOSIS — M2142 Flat foot [pes planus] (acquired), left foot: Secondary | ICD-10-CM | POA: Diagnosis not present

## 2021-08-16 NOTE — Telephone Encounter (Signed)
Inbound call from patient, scheduled an appointment with Dr. Henrene Pastor his Dr. Patient is being seen for IBS and Gas. Patient is seeking advice to help in the mean time with the gas issue. Please advise.

## 2021-08-16 NOTE — Telephone Encounter (Signed)
Pt states his IBS is causing him to have lots of gas. He has tried gas-x and beano and they have not helped. Discussed with pt that he can try IB Gard OTC and see if that helps prior to his upcoming appt.

## 2021-08-16 NOTE — Progress Notes (Signed)
SITUATION Reason for Visit: Fitting of Diabetic Shoes & Insoles Patient / Caregiver Report:  Patient reports comfort  OBJECTIVE DATA: Patient History / Diagnosis:     ICD-10-CM   1. Type 2 diabetes mellitus with peripheral neuropathy (HCC)  E11.42       Change in Status:   None  ACTIONS PERFORMED: In-Person Delivery, patient was fit with: - 1x pair [id number] PDAC approved prefabricated Diabetic Shoes: Apex grey hiking sneakers - 3x pair [id number] PDAC approved CAM milled custom diabetic insoles  Shoes and insoles were verified for structural integrity and safety. Patient wore shoes and insoles in office. Skin was inspected and free of areas of concern after wearing shoes and inserts. Shoes and inserts fit properly. Patient / Caregiver provided with ferbal instruction and demonstration regarding donning, doffing, wear, care, proper fit, function, purpose, cleaning, and use of shoes and insoles ' and in all related precautions and risks and benefits regarding shoes and insoles. Patient / Caregiver was instructed to wear properly fitting socks with shoes at all times. Patient was also provided with verbal instruction regarding how to report any failures or malfunctions of shoes or inserts, and necessary follow up care. Patient / Caregiver was also instructed to contact physician regarding change in status that may affect function of shoes and inserts.   Patient / Caregiver verbalized undersatnding of instruction provided. Patient / Caregiver demonstrated independence with proper donning and doffing of shoes and inserts.  PLAN Patient to follow up as needed. Plan of care was discussed with and agreed upon by patient and/or caregiver. All questions were answered and concerns addressed.

## 2021-08-19 ENCOUNTER — Ambulatory Visit: Payer: Medicare HMO

## 2021-09-02 ENCOUNTER — Other Ambulatory Visit: Payer: Self-pay

## 2021-09-02 ENCOUNTER — Ambulatory Visit (INDEPENDENT_AMBULATORY_CARE_PROVIDER_SITE_OTHER): Payer: Medicare HMO

## 2021-09-02 VITALS — Ht 71.0 in | Wt 219.0 lb

## 2021-09-02 DIAGNOSIS — Z0000 Encounter for general adult medical examination without abnormal findings: Secondary | ICD-10-CM

## 2021-09-02 NOTE — Patient Instructions (Signed)
Cameron Daniel , Thank you for taking time to come for your Medicare Wellness Visit. I appreciate your ongoing commitment to your health goals. Please review the following plan we discussed and let me know if I can assist you in the future.   Screening recommendations/referrals: Colonoscopy: Cologuard done 06/08/2021. Repeat every 3 years.  Recommended yearly ophthalmology/optometry visit for glaucoma screening and checkup Recommended yearly dental visit for hygiene and checkup  Vaccinations: Influenza vaccine: Done 05/31/2021 Repeat annually  Pneumococcal vaccine: Done 03/07/2016 and 03/05/2020 Tdap vaccine: Done 10/24/2011 Repeat in 10 years  Shingles vaccine: Done 03/21/2011, 08/15/2017, 01/26/2018 and 11/28/2018.   Covid-19: Done 10/03/2019, 10/24/2019 and 06/01/2020.  Advanced directives: Please bring a copy of your health care power of attorney and living will to the office to be added to your chart at your convenience.   Conditions/risks identified: Aim for 30 minutes of exercise or walking each day, drink 6-8 glasses of water and eat lots of fruits and vegetables.   Next appointment: Follow up in one year for your annual wellness visit. 2024.  Preventive Care 37 Years and Older, Male  Preventive care refers to lifestyle choices and visits with your health care provider that can promote health and wellness. What does preventive care include? A yearly physical exam. This is also called an annual well check. Dental exams once or twice a year. Routine eye exams. Ask your health care provider how often you should have your eyes checked. Personal lifestyle choices, including: Daily care of your teeth and gums. Regular physical activity. Eating a healthy diet. Avoiding tobacco and drug use. Limiting alcohol use. Practicing safe sex. Taking low doses of aspirin every day. Taking vitamin and mineral supplements as recommended by your health care provider. What happens during an annual well  check? The services and screenings done by your health care provider during your annual well check will depend on your age, overall health, lifestyle risk factors, and family history of disease. Counseling  Your health care provider may ask you questions about your: Alcohol use. Tobacco use. Drug use. Emotional well-being. Home and relationship well-being. Sexual activity. Eating habits. History of falls. Memory and ability to understand (cognition). Work and work Statistician. Screening  You may have the following tests or measurements: Height, weight, and BMI. Blood pressure. Lipid and cholesterol levels. These may be checked every 5 years, or more frequently if you are over 52 years old. Skin check. Lung cancer screening. You may have this screening every year starting at age 17 if you have a 30-pack-year history of smoking and currently smoke or have quit within the past 15 years. Fecal occult blood test (FOBT) of the stool. You may have this test every year starting at age 45. Flexible sigmoidoscopy or colonoscopy. You may have a sigmoidoscopy every 5 years or a colonoscopy every 10 years starting at age 90. Prostate cancer screening. Recommendations will vary depending on your family history and other risks. Hepatitis C blood test. Hepatitis B blood test. Sexually transmitted disease (STD) testing. Diabetes screening. This is done by checking your blood sugar (glucose) after you have not eaten for a while (fasting). You may have this done every 1-3 years. Abdominal aortic aneurysm (AAA) screening. You may need this if you are a current or former smoker. Osteoporosis. You may be screened starting at age 11 if you are at high risk. Talk with your health care provider about your test results, treatment options, and if necessary, the need for more tests. Vaccines  Your health care provider may recommend certain vaccines, such as: Influenza vaccine. This is recommended every  year. Tetanus, diphtheria, and acellular pertussis (Tdap, Td) vaccine. You may need a Td booster every 10 years. Zoster vaccine. You may need this after age 76. Pneumococcal 13-valent conjugate (PCV13) vaccine. One dose is recommended after age 52. Pneumococcal polysaccharide (PPSV23) vaccine. One dose is recommended after age 32. Talk to your health care provider about which screenings and vaccines you need and how often you need them. This information is not intended to replace advice given to you by your health care provider. Make sure you discuss any questions you have with your health care provider. Document Released: 09/11/2015 Document Revised: 05/04/2016 Document Reviewed: 06/16/2015 Elsevier Interactive Patient Education  2017 [place] Prevention in the Home Falls can cause injuries. They can happen to people of all ages. There are many things you can do to make your home safe and to help prevent falls. What can I do on the outside of my home? Regularly fix the edges of walkways and driveways and fix any cracks. Remove anything that might make you trip as you walk through a door, such as a raised step or threshold. Trim any bushes or trees on the path to your home. Use bright outdoor lighting. Clear any walking paths of anything that might make someone trip, such as rocks or tools. Regularly check to see if handrails are loose or broken. Make sure that both sides of any steps have handrails. Any raised decks and porches should have guardrails on the edges. Have any leaves, snow, or ice cleared regularly. Use sand or salt on walking paths during winter. Clean up any spills in your garage right away. This includes oil or grease spills. What can I do in the bathroom? Use night lights. Install grab bars by the toilet and in the tub and shower. Do not use towel bars as grab bars. Use non-skid mats or decals in the tub or shower. If you need to sit down in the shower, use a  plastic, non-slip stool. Keep the floor dry. Clean up any water that spills on the floor as soon as it happens. Remove soap buildup in the tub or shower regularly. Attach bath mats securely with double-sided non-slip rug tape. Do not have throw rugs and other things on the floor that can make you trip. What can I do in the bedroom? Use night lights. Make sure that you have a light by your bed that is easy to reach. Do not use any sheets or blankets that are too big for your bed. They should not hang down onto the floor. Have a firm chair that has side arms. You can use this for support while you get dressed. Do not have throw rugs and other things on the floor that can make you trip. What can I do in the kitchen? Clean up any spills right away. Avoid walking on wet floors. Keep items that you use a lot in easy-to-reach places. If you need to reach something above you, use a strong step stool that has a grab bar. Keep electrical cords out of the way. Do not use floor polish or wax that makes floors slippery. If you must use wax, use non-skid floor wax. Do not have throw rugs and other things on the floor that can make you trip. What can I do with my stairs? Do not leave any items on the stairs. Make sure that there are  handrails on both sides of the stairs and use them. Fix handrails that are broken or loose. Make sure that handrails are as long as the stairways. Check any carpeting to make sure that it is firmly attached to the stairs. Fix any carpet that is loose or worn. Avoid having throw rugs at the top or bottom of the stairs. If you do have throw rugs, attach them to the floor with carpet tape. Make sure that you have a light switch at the top of the stairs and the bottom of the stairs. If you do not have them, ask someone to add them for you. What else can I do to help prevent falls? Wear shoes that: Do not have high heels. Have rubber bottoms. Are comfortable and fit you  well. Are closed at the toe. Do not wear sandals. If you use a stepladder: Make sure that it is fully opened. Do not climb a closed stepladder. Make sure that both sides of the stepladder are locked into place. Ask someone to hold it for you, if possible. Clearly mark and make sure that you can see: Any grab bars or handrails. First and last steps. Where the edge of each step is. Use tools that help you move around (mobility aids) if they are needed. These include: Canes. Walkers. Scooters. Crutches. Turn on the lights when you go into a dark area. Replace any light bulbs as soon as they burn out. Set up your furniture so you have a clear path. Avoid moving your furniture around. If any of your floors are uneven, fix them. If there are any pets around you, be aware of where they are. Review your medicines with your doctor. Some medicines can make you feel dizzy. This can increase your chance of falling. Ask your doctor what other things that you can do to help prevent falls. This information is not intended to replace advice given to you by your health care provider. Make sure you discuss any questions you have with your health care provider. Document Released: 06/11/2009 Document Revised: 01/21/2016 Document Reviewed: 09/19/2014 Elsevier Interactive Patient Education  2017 [employer].

## 2021-09-02 NOTE — Progress Notes (Signed)
Subjective:   Cameron Daniel is a 76 y.o. male who presents for Medicare Annual/Subsequent preventive examination. Virtual Visit via Telephone Note  I connected with  Cameron Daniel on 09/02/21 at 10:30 AM EST by telephone and verified that I am speaking with the correct person using two identifiers.  Location: Patient: Home Provider: [facility] Persons participating in the virtual visit: patient/Nurse Health Advisor   I discussed the limitations, risks, security and privacy concerns of performing an evaluation and management service by telephone and the availability of in person appointments. The patient expressed understanding and agreed to proceed.  Interactive audio and video telecommunications were attempted between this nurse and patient, however failed, due to patient having technical difficulties OR patient did not have access to video capability.  We continued and completed visit with audio only.  Some vital signs may be absent or patient reported.   Chriss Driver, LPN  Review of Systems     Cardiac Risk Factors include: advanced age (>21mn, >>49women);dyslipidemia;diabetes mellitus;obesity (BMI >30kg/m2);sedentary lifestyle;male gender   PHONE VISIT. PT AT HOME. NURSE AT [facility]. Objective:    Today's Vitals   09/02/21 1039 09/02/21 1040  Weight: 219 lb (99.3 kg)   Height: _0  (1.803 m)   PainSc:  6    Body mass index is 30.54 kg/m.  Advanced Directives 09/02/2021 02/22/2021 06/19/2020 04/15/2019 04/12/2019 04/11/2019 02/15/2019  Does Patient Have a Medical Advance Directive? Yes Yes No Yes Yes Yes No  Type of [place]amed[place]of [place] will Healthcare Power of [place]-  Does patient want to make changes to medical advance directive? - Yes (MAU/Ambulatory/Procedural Areas - Information given) - No - Patient declined - No - Patient declined -  Copy  of HNew Homein Chart? No - copy requested No - copy requested - - - - -  Would patient like information on creating a medical advance directive? - - No - Patient declined - - - No - Patient declined  Pre-existing out of facility DNR order (yellow form or pink MOST form) - - - - - - -    Current Medications (verified) Outpatient Encounter Medications as of 09/02/2021  Medication Sig   acetaminophen (TYLENOL) 500 MG tablet Take 1,000 mg by mouth at bedtime as needed for mild pain.   aspirin EC 81 MG tablet Take 1 tablet (81 mg total) by mouth daily.   azelastine (ASTELIN) 0.1 % nasal spray Place 2 sprays into both nostrils 2 (two) times daily. Use in each nostril as directed   carvedilol (COREG) 6.25 MG tablet Take 1 tablet (6.25 mg total) by mouth 2 (two) times daily with a meal.   citalopram (CELEXA) 10 MG tablet Take 1 tablet (10 mg total) by mouth daily.   colestipol (COLESTID) 5 g packet Take 5 g by mouth daily.    diphenoxylate-atropine (LOMOTIL) 2.5-0.025 MG tablet Take 2 tablets by mouth 4 (four) times daily as needed for diarrhea or loose stools.   fexofenadine (ALLEGRA) 180 MG tablet Take 1 tablet (180 mg total) by mouth daily as needed. For allergies   JANUVIA 50 MG tablet TAKE 1 TABLET BY MOUTH EVERY DAY   JARDIANCE 25 MG TABS tablet TAKE 25 MG (1 TABLET) BY MOUTH DAILY BEFORE BREAKFAST. STOP PIOGLITAZONE   losartan (COZAAR) 25 MG tablet TAKE 1 TABLET BY MOUTH EVERY DAY   loteprednol (LOTEMAX) 0.5 %  ophthalmic suspension Place 1 drop into both eyes 4 (four) times daily as needed (itching).   magnesium oxide (MAG-OX) 400 MG tablet Take 1 tablet (400 mg total) by mouth 2 (two) times daily.   montelukast (SINGULAIR) 10 MG tablet TAKE 1 TABLET BY MOUTH EVERYDAY AT BEDTIME   OVER THE COUNTER MEDICATION Balance of Nature   pantoprazole (PROTONIX) 40 MG tablet TAKE 1 TABLET BY MOUTH EVERY DAY IN THE MORNING   predniSONE (DELTASONE) 20 MG tablet Take 2 tablets (40 mg  total) by mouth daily with breakfast.   rosuvastatin (CRESTOR) 40 MG tablet TAKE 1 TABLET (40 MG TOTAL) BY MOUTH DAILY AT 6 PM.   vitamin B-12 (CYANOCOBALAMIN) 100 MCG tablet Take 100 mcg by mouth daily.   finasteride (PROSCAR) 5 MG tablet TAKE 1 TABLET BY MOUTH EVERY DAY (Patient not taking: Reported on 09/02/2021)   No facility-administered encounter medications on file as of 09/02/2021.    Allergies (verified) Doxazosin mesylate, Flomax [tamsulosin hcl], Penicillins, Sulfonamide derivatives, and Trulicity [dulaglutide]   History: Past Medical History:  Diagnosis Date   Allergic rhinitis, cause unspecified    Allergy    Anxiety    Arthritis    Cancer ([place])    Carotid Doppler 04/2020   Carotid US 9/21: No evidence of ICA stenosis bilaterally; right vertebral artery stenosis   Cataract    Chronic combined systolic (congestive) and diastolic (congestive) heart failure (HCC)    Coronary atherosclerosis of unspecified type of vessel, native or graft    a. MI 1985 with unclear details. b. CABG 03/2019 with LIMA -> LAD, SVG -> OM, SVG to dRCA.   Depression    PTSD    Diverticulosis of colon (without mention of hemorrhage)    Esophageal reflux    Esophageal stricture    Essential hypertension    Former tobacco use    Hemorrhoids    Hyperlipidemia    ICD (implantable cardioverter-defibrillator) in place    Irritable bowel syndrome    Ischemic cardiomyopathy    Mild carotid artery disease ([place])    a. 1-39% bilaterally 03/2019 duplex.   Myocardial infarction (HCC)    Nocturia    Obesity, unspecified    Orthostasis    Other acquired absence of organ    Personal history of colonic polyps    Psychosexual dysfunction with inhibited sexual excitement    Retroperitoneal bleed    Type II or unspecified type diabetes mellitus without mention of complication, not stated as uncontrolled    VT (ventricular tachycardia)    Past Surgical History:  Procedure Laterality Date   ADENOIDECTOMY      CARPAL TUNNEL RELEASE     left    CHOLECYSTECTOMY     COLON RESECTION     18 inches   COLON SURGERY  2013   CORONARY ANGIOPLASTY     1985    CORONARY ARTERY BYPASS GRAFT N/A 04/26/2019   Procedure: CORONARY ARTERY BYPASS GRAFTING (CABG) x Three, using left internal mammary artery and right leg greater saphenous vein harvested endoscopically;  Surgeon: Gaye Pollack, MD;  Location: [facility];  Service: Open Heart Surgery;  Laterality: N/A;   ELBOW BURSA SURGERY     EYE SURGERY     ICD IMPLANT N/A 06/25/2020   Procedure: ICD IMPLANT;  Surgeon: Vickie Epley, MD;  Location: [facility];  Service: Cardiovascular;  Laterality: N/A;   LEFT HEART CATH AND CORS/GRAFTS ANGIOGRAPHY N/A 06/19/2020   Procedure: LEFT HEART CATH AND CORS/GRAFTS ANGIOGRAPHY;  Surgeon: Troy Sine, MD;  Location: [facility];  Service: Cardiovascular;  Laterality: N/A;   POLYPECTOMY     RIGHT/LEFT HEART CATH AND CORONARY ANGIOGRAPHY N/A 04/16/2019   Procedure: RIGHT/LEFT HEART CATH AND CORONARY ANGIOGRAPHY;  Surgeon: Belva Crome, MD;  Location: [facility];  Service: Cardiovascular;  Laterality: N/A;   SHOULDER SURGERY     Rt. Shoulder   TEE WITHOUT CARDIOVERSION N/A 04/26/2019   Procedure: TRANSESOPHAGEAL ECHOCARDIOGRAM (TEE);  Surgeon: Gaye Pollack, MD;  Location: [facility];  Service: Open Heart Surgery;  Laterality: N/A;   TONSILLECTOMY     V TACH ABLATION N/A 06/22/2020   Procedure: V TACH ABLATION;  Surgeon: Vickie Epley, MD;  Location: [facility];  Service: Cardiovascular;  Laterality: N/A;   Family History  Problem Relation Age of Onset   Lung cancer Father    Allergic rhinitis Father    Coronary artery disease Mother    Colon cancer Neg Hx    Heart attack Neg Hx    Hypertension Neg Hx    Stroke Neg Hx    Esophageal cancer Neg Hx    Rectal cancer Neg Hx    Stomach cancer Neg Hx    Pancreatic cancer Neg Hx    Social History   Socioeconomic History    Marital status: Married    Spouse name: Not on file   Number of children: 1   Years of education: 12   Highest education level: High school graduate  Occupational History   Occupation: [employer]  Tobacco Use   Smoking status: Former    Types: Cigarettes    Quit date: 08/30/1983    Years since quitting: 38.0   Smokeless tobacco: Former   Tobacco comments:    Quit 30 years ago.  Vaping Use   Vaping Use: Never used  Substance and Sexual Activity   Alcohol use: Yes    Comment: rare   Drug use: No   Sexual activity: Yes  Other Topics Concern   Not on file  Social History Narrative   Not on file   Social Determinants of Health   Financial Resource Strain: Low Risk    Difficulty of Paying Living Expenses: Not hard at all  Food Insecurity: No Food Insecurity   Worried About Charity fundraiser in the Last Year: Never true   [place] in the Last Year: Never true  Transportation Needs: No Transportation Needs   Lack of Transportation (Medical): No   Lack of Transportation (Non-Medical): No  Physical Activity: Inactive   Days of Exercise per Week: 0 days   Minutes of Exercise per Session: 0 min  Stress: No Stress Concern Present   Feeling of Stress : Not at all  Social Connections: Moderately Isolated   Frequency of Communication with Friends and Family: More than three times a week   Frequency of Social Gatherings with Friends and Family: More than three times a week   Attends Religious Services: Never   Marine scientist or Organizations: No   Attends Archivist Meetings: Never   Marital Status: Married    Tobacco Counseling Counseling given: Not Answered Tobacco comments: Quit 30 years ago.   Clinical Intake:  Pre-visit preparation completed: Yes  Pain : 0-10 Pain Score: 6  Pain Type: Chronic pain Pain Location: Knee Pain Descriptors / Indicators: Aching Pain Onset: More than a month ago Pain Frequency: Intermittent     BMI -  recorded:  30.54 Nutritional Status: BMI > 30  Obese Nutritional Risks: None Diabetes: Yes  How often do you need to have someone help you when you read instructions, pamphlets, or other written materials from your doctor or pharmacy?: 1 - Never  Diabetic?Nutrition Risk Assessment:  Has the patient had any N/V/D within the last 2 months?  No  Does the patient have any non-healing wounds?  No  Has the patient had any unintentional weight loss or weight gain?  No   Diabetes:  Is the patient diabetic?  Yes  If diabetic, was a CBG obtained today?  No  Did the patient bring in their glucometer from home?  No  How often do you monitor your CBG's? Daily.   Financial Strains and Diabetes Management:  Are you having any financial strains with the device, your supplies or your medication? Yes .  Does the patient want to be seen by Chronic Care Management for management of their diabetes?  No  Would the patient like to be referred to a Nutritionist or for Diabetic Management?  No   Diabetic Exams:  Diabetic Eye Exam: Completed 09/11/2020. Pt has been advised about the importance in completing this exam Diabetic Foot Exam: Completed 05/05/2020. Pt has been advised about the importance in completing this exam.   Interpreter Needed?: No  Information entered by :: MJ Glenette Bookwalter, LPN   Activities of Daily Living In your present state of health, do you have any difficulty performing the following activities: 09/02/2021  Hearing? N  Vision? N  Difficulty concentrating or making decisions? N  Walking or climbing stairs? N  Dressing or bathing? N  Doing errands, shopping? N  Preparing Food and eating ? N  Using the Toilet? N  In the past six months, have you accidently leaked urine? Y  Do you have problems with loss of bowel control? N  Managing your Medications? N  Managing your Finances? N  Housekeeping or managing your Housekeeping? N  Some recent data might be hidden    Patient Care Team: [name], MD as PCP - General (Family Medicine) Dorothy Spark, MD as PCP - Cardiology (Cardiology) Bensimhon, Shaune Pascal, MD as PCP - Advanced Heart Failure (Cardiology) Vickie Epley, MD as PCP - Electrophysiology (Cardiology) Kassie Mends, RN as [place] as Pharmacist (Pharmacist)  Indicate any recent Medical Services you may have received from other than [facility] providers in the past year (date may be approximate).     Assessment:   This is a routine wellness examination for Cameron Daniel.  Hearing/Vision screen Hearing Screening - Comments:: Hearing aids Vision Screening - Comments:: [facility] glasses[facility] [place]  Dietary issues and exercise activities discussed: Current Exercise Habits: The patient does not participate in regular exercise at present, Exercise limited by: orthopedic condition(s);cardiac condition(s)   Goals Addressed             This Visit's Progress    Exercise 3x per week (30 min per time)       Increase exercise as tolerated. Get boat fixed.        Depression Screen PHQ 2/9 Scores 09/02/2021 02/22/2021 08/27/2020 05/30/2019 02/15/2019 09/18/2017 12/05/2016  PHQ - 2 Score 0 1 6 0 0 0 0  PHQ- 9 Score - - 14 - - - 0    Fall Risk Fall Risk  09/02/2021 02/22/2021 08/27/2020 05/30/2019 02/15/2019  Falls in the past year? 0 0 0 0 0  Number falls in past yr: 0 - - 0 -  Injury with Fall? 0 - - 0 -  Risk for fall due to : Impaired balance/gait;Impaired mobility - Impaired balance/gait Other (Comment);Impaired balance/gait -  Risk for fall due to: Comment - - - Single leg stand test: 3.25 sec. -  Follow up Falls prevention discussed - Falls evaluation completed Falls evaluation completed Falls evaluation completed    FALL RISK PREVENTION PERTAINING TO THE HOME:  Any stairs in or around the home? Yes  If so, are there any without handrails? No  Home free of loose throw rugs in walkways, pet beds,  electrical cords, etc? Yes  Adequate lighting in your home to reduce risk of falls? Yes   ASSISTIVE DEVICES UTILIZED TO PREVENT FALLS:  Life alert? No  Use of a cane, walker or w/c? No  Grab bars in the bathroom? Yes  Shower chair or bench in shower? Yes  Elevated toilet seat or a handicapped toilet? Yes   TIMED UP AND GO:  Was the test performed? No .  Phone visit.  Cognitive Function:     6CIT Screen 09/02/2021  What Year? 0 points  What month? 0 points  What time? 0 points  Count back from 20 0 points  Months in reverse 0 points  Repeat phrase 0 points  Total Score 0    Immunizations Immunization History  Administered Date(s) Administered   Fluad Quad(high Dose 65+) 06/07/2019, 07/02/2020, 05/31/2021   H1N1 07/29/2008   Influenza, High Dose Seasonal PF 06/09/2015   Influenza, Seasonal, Injecte, Preservative Fre 07/03/2009, 06/02/2010, 06/27/2011, 06/03/2014   Influenza,inj,Quad PF,6+ Mos 05/28/2013, 06/14/2016, 05/29/2017, 05/07/2018   Influenza-Unspecified 06/29/2008, 05/10/2012, 05/29/2012, 06/27/2013, 05/23/2014, 06/07/2016, 06/29/2017, 05/29/2018, 05/30/2019, 06/17/2020   MMR 10/30/2014   PFIZER(Purple Top)SARS-COV-2 Vaccination 10/03/2019, 10/24/2019, 06/01/2020   Pneumococcal Conjugate-13 11/10/2014, 12/08/2016, 03/05/2020   Pneumococcal Polysaccharide-23 10/29/2008, 05/28/2013, 03/07/2016   Tdap 10/24/2011   Zoster Recombinat (Shingrix) 08/15/2017, 01/26/2018   Zoster, Live 03/21/2011, 11/28/2018    TDAP status: Up to date  Flu Vaccine status: Up to date  Pneumococcal vaccine status: Up to date  Covid-19 vaccine status: Completed vaccines  Qualifies for Shingles Vaccine? Yes   Zostavax completed Yes   Shingrix Completed?: Yes  Screening Tests Health Maintenance  Topic Date Due   Hepatitis C Screening  Never done   COVID-19 Vaccine (4 - Booster for Pfizer series) 07/27/2020   FOOT EXAM  05/05/2021   OPHTHALMOLOGY EXAM  09/11/2021   TETANUS/TDAP   10/23/2021   HEMOGLOBIN A1C  01/28/2022   Fecal DNA (Cologuard)  06/08/2024   Pneumonia Vaccine 11+ Years old  Completed   INFLUENZA VACCINE  Completed   Zoster Vaccines- Shingrix  Completed   HPV VACCINES  Aged Out   COLONOSCOPY (Pts 45-48yr Insurance coverage will need to be confirmed)  Discontinued    Health Maintenance  Health Maintenance Due  Topic Date Due   Hepatitis C Screening  Never done   COVID-19 Vaccine (4 - Booster for [place]) 07/27/2020   FOOT EXAM  05/05/2021    Colorectal cancer screening: Type of screening: Cologuard. Completed 06/08/2021. Repeat every 3 years  Lung Cancer Screening: (Low Dose CT Chest recommended if Age 75-80years, 30 pack-year currently smoking OR have quit w/in 15years.) does qualify.    Additional Screening:  Hepatitis C Screening: does qualify; Completed Due  Vision Screening: Recommended annual ophthalmology exams for early detection of glaucoma and other disorders of the eye. Is the patient up to  date with their annual eye exam?  Yes  Who is the provider or what is the name of the office in which the patient attends annual eye exams[facility] [place] If pt is not established with a provider, would they like to be referred to a provider to establish care? No .   Dental Screening: Recommended annual dental exams for proper oral hygiene  Community Resource Referral / Chronic Care Management: CRR required this visit?  No   CCM required this visit?  No      Plan:     I have personally reviewed and noted the following in the patients chart:   Medical and social history Use of alcohol, tobacco or illicit drugs  Current medications and supplements including opioid prescriptions. Patient is not currently taking opioid prescriptions. Functional ability and status Nutritional status Physical activity Advanced directives List of other physicians Hospitalizations, surgeries, and ER visits in previous 12  months Vitals Screenings to include cognitive, depression, and falls Referrals and appointments  In addition, I have reviewed and discussed with patient certain preventive protocols, quality metrics, and best practice recommendations. A written personalized care plan for preventive services as well as general preventive health recommendations were provided to patient.     Chriss Driver, LPN   10/05/6699   Nurse Notes: Pt is up to date on all age appropriate health maintenance and vaccines. Discussed DM foot exam and need for.

## 2021-09-05 ENCOUNTER — Other Ambulatory Visit: Payer: Self-pay | Admitting: Family Medicine

## 2021-09-08 DIAGNOSIS — M1711 Unilateral primary osteoarthritis, right knee: Secondary | ICD-10-CM | POA: Diagnosis not present

## 2021-09-10 ENCOUNTER — Telehealth: Payer: Medicare HMO

## 2021-09-14 ENCOUNTER — Telehealth: Payer: Self-pay

## 2021-09-14 ENCOUNTER — Other Ambulatory Visit: Payer: Self-pay | Admitting: Family Medicine

## 2021-09-14 NOTE — Telephone Encounter (Signed)
Spoke with patient.  Manual transmission received.  Advised normal device function.   There are some brief episodes of HVR, max rate of 150 (monitor only zone) jusging by morphology, likely SVT.  Pt has known history of this.    Educated patient to continue monitoring, and notify of symptomatic.

## 2021-09-14 NOTE — Telephone Encounter (Signed)
The patient states his HR was over 130 bpm then went back down to 70 bpm. I talked the patient into sending a transmission. Transmission received. I told him the nurse will give him a call back.

## 2021-09-16 ENCOUNTER — Ambulatory Visit: Payer: Medicare HMO | Admitting: Internal Medicine

## 2021-09-16 ENCOUNTER — Encounter: Payer: Self-pay | Admitting: Internal Medicine

## 2021-09-16 VITALS — BP 118/60 | HR 64 | Ht 71.0 in | Wt 226.0 lb

## 2021-09-16 DIAGNOSIS — R142 Eructation: Secondary | ICD-10-CM | POA: Diagnosis not present

## 2021-09-16 DIAGNOSIS — R141 Gas pain: Secondary | ICD-10-CM

## 2021-09-16 DIAGNOSIS — Z86010 Personal history of colonic polyps: Secondary | ICD-10-CM

## 2021-09-16 DIAGNOSIS — R143 Flatulence: Secondary | ICD-10-CM | POA: Diagnosis not present

## 2021-09-16 MED ORDER — PLENVU 140 G PO SOLR: 1.0000 | Freq: Once | ORAL | 0 refills | Status: AC

## 2021-09-16 NOTE — Patient Instructions (Signed)
If you are age 75 or older, your body mass index should be between 23-30. Your Body mass index is 31.52 kg/m. If this is out of the aforementioned range listed, please consider follow up with your Primary Care Provider.  If you are age 75 or younger, your body mass index should be between 19-25. Your Body mass index is 31.52 kg/m. If this is out of the aformentioned range listed, please consider follow up with your Primary Care Provider.   ________________________________________________________  The [facility] providers would like to encourage you to use [facility] to communicate with providers for non-urgent requests or questions.  Due to long hold times on the telephone, sending your provider a message by [facility] may be a faster and more efficient way to get a response.  Please allow 48 business hours for a response.  Please remember that this is for non-urgent requests.  _______________________________________________________  Cameron Daniel have been scheduled for a colonoscopy. Please follow written instructions given to you at your visit today.  Please pick up your prep supplies at the pharmacy within the next 1-3 days. If you use inhalers (even only as needed), please bring them with you on the day of your procedure.

## 2021-09-16 NOTE — Progress Notes (Signed)
HISTORY OF PRESENT ILLNESS:  Cameron Daniel is a 76 y.o. male with multiple significant medical problems including hypertension, diabetes mellitus, obesity, hyperlipidemia, ischemic cardiomyopathy, coronary artery disease status post CABG August 2020, ventricular tachycardia status post ablation and ICD placement October 7672, complicated diverticular disease with segmental resection, and a history of multiple adenomatous colon polyps for which she is under a colon cancer surveillance program.  He presents today regarding recent problems with increased foul-smelling flatus and the need for surveillance colonoscopy.  About 2 months ago he did perform Cologuard testing with his PCP which returned negative.  Patient tells me that he has been having problems with foul-smelling gas for several months.  He tried probiotics without improvement.  He contacted the office and it was recommended that he try IBgard.  This has helped.  He is pleased.  Review of blood work from December 2022 shows unremarkable comprehensive metabolic panel except for elevated glucose.  Normal CBC with hemoglobin 16.4.  Hemoglobin A1c 7.0.  CT of the abdomen and pelvis March 2021 to evaluate right lower quadrant pain revealed no acute abnormalities though he did have a resolving retroperitoneal hematoma after cardiac catheterization.  Patient has undergone multiple colonoscopies in 2003, 2008, 2013 [facility] in [place]).  Status post sigmoid colectomy 2013.  His last colonoscopy here in August 2018 revealed multiple tubular adenomas and pandiverticulosis as well as hemorrhoids.  Follow-up in 3 years recommended.  Patient remains active fishing and other activities.  He is interested in his surveillance examination as he now understands that Cologuard testing is not an appropriate tool for colorectal neoplasia surveillance.  His ejection fraction has remained 45 to 50% range.  He does have alternating bowel habits, which are not new.  No  other GI complaints.  REVIEW OF SYSTEMS:  All non-GI ROS negative unless otherwise stated in the HPI except for arthritis  Past Medical History:  Diagnosis Date   Allergic rhinitis, cause unspecified    Allergy    Anxiety    Arthritis    Cancer ([place])    Carotid Doppler 04/2020   Carotid US 9/21: No evidence of ICA stenosis bilaterally; right vertebral artery stenosis   Cataract    Chronic combined systolic (congestive) and diastolic (congestive) heart failure (HCC)    Coronary atherosclerosis of unspecified type of vessel, native or graft    a. MI 1985 with unclear details. b. CABG 03/2019 with LIMA -> LAD, SVG -> OM, SVG to dRCA.   Depression    PTSD    Diverticulosis of colon (without mention of hemorrhage)    Esophageal reflux    Esophageal stricture    Essential hypertension    Former tobacco use    Hemorrhoids    Hyperlipidemia    ICD (implantable cardioverter-defibrillator) in place    Irritable bowel syndrome    Ischemic cardiomyopathy    Mild carotid artery disease ([place])    a. 1-39% bilaterally 03/2019 duplex.   Myocardial infarction (HCC)    Nocturia    Obesity, unspecified    Orthostasis    Other acquired absence of organ    Personal history of colonic polyps    Psychosexual dysfunction with inhibited sexual excitement    Retroperitoneal bleed    Type II or unspecified type diabetes mellitus without mention of complication, not stated as uncontrolled    VT (ventricular tachycardia)     Past Surgical History:  Procedure Laterality Date   ADENOIDECTOMY     CARPAL TUNNEL RELEASE  left    CHOLECYSTECTOMY     COLON RESECTION     18 inches   COLON SURGERY  2013   CORONARY ANGIOPLASTY     1985    CORONARY ARTERY BYPASS GRAFT N/A 04/26/2019   Procedure: CORONARY ARTERY BYPASS GRAFTING (CABG) x Three, using left internal mammary artery and right leg greater saphenous vein harvested endoscopically;  Surgeon: Gaye Pollack, MD;  Location: [facility];  Service:  Open Heart Surgery;  Laterality: N/A;   ELBOW BURSA SURGERY     EYE SURGERY     ICD IMPLANT N/A 06/25/2020   Procedure: ICD IMPLANT;  Surgeon: Vickie Epley, MD;  Location: [facility];  Service: Cardiovascular;  Laterality: N/A;   LEFT HEART CATH AND CORS/GRAFTS ANGIOGRAPHY N/A 06/19/2020   Procedure: LEFT HEART CATH AND CORS/GRAFTS ANGIOGRAPHY;  Surgeon: Troy Sine, MD;  Location: [facility];  Service: Cardiovascular;  Laterality: N/A;   POLYPECTOMY     RIGHT/LEFT HEART CATH AND CORONARY ANGIOGRAPHY N/A 04/16/2019   Procedure: RIGHT/LEFT HEART CATH AND CORONARY ANGIOGRAPHY;  Surgeon: Belva Crome, MD;  Location: [facility];  Service: Cardiovascular;  Laterality: N/A;   SHOULDER SURGERY     Rt. Shoulder   TEE WITHOUT CARDIOVERSION N/A 04/26/2019   Procedure: TRANSESOPHAGEAL ECHOCARDIOGRAM (TEE);  Surgeon: Gaye Pollack, MD;  Location: [facility];  Service: Open Heart Surgery;  Laterality: N/A;   TONSILLECTOMY     V TACH ABLATION N/A 06/22/2020   Procedure: V TACH ABLATION;  Surgeon: Vickie Epley, MD;  Location: [facility];  Service: Cardiovascular;  Laterality: N/A;    Social History Cameron Daniel  reports that he quit smoking about 38 years ago. His smoking use included cigarettes. He has quit using smokeless tobacco. He reports current alcohol use. He reports that he does not use drugs.  family history includes Allergic rhinitis in his father; Coronary artery disease in his mother; Lung cancer in his father.  Allergies  Allergen Reactions   Doxazosin Mesylate Other (See Comments)    Drops in blood pressure    Flomax [Tamsulosin Hcl] Itching and Other (See Comments)    Drops in blood pressure     Penicillins Rash and Other (See Comments)    Has patient had a PCN reaction causing immediate rash, facial/tongue/throat swelling, SOB or lightheadedness with hypotension: No Has patient had a PCN reaction causing severe rash involving mucus  membranes or skin necrosis: No Has patient had a PCN reaction that required hospitalization No Has patient had a PCN reaction occurring within the last 10 years: No If all of the above answers are "NO", then may proceed with Cephalosporin use.    Sulfonamide Derivatives Rash   Trulicity [Dulaglutide] Nausea And Vomiting    N&V, Dizziness       PHYSICAL EXAMINATION: Vital signs: BP 118/60    Pulse 64    Ht 5' 11" (1.803 m)    Wt 226 lb (102.5 kg)    BMI 31.52 kg/m   Constitutional: Pleasant, generally well-appearing, no acute distress Psychiatric: alert and oriented x3, cooperative Eyes: extraocular movements intact, anicteric, conjunctiva pink Mouth: oral pharynx moist, no lesions Neck: supple no lymphadenopathy Cardiovascular: heart regular rate and rhythm, no murmur Lungs: clear to auscultation bilaterally Abdomen: soft, BS, nontender, nondistended, no obvious ascites, no peritoneal signs, normal bowel sounds, no organomegaly.  Previous surgical incisions well-healed Rectal: Deferred until colonoscopy Extremities: no clubbing, cyanosis, or lower extremity edema bilaterally  Skin: no lesions on visible extremities Neuro: No focal deficits.  Cranial nerves intact  ASSESSMENT:  1.  Increase intestinal gas as manifested by flatus.  Improved with IBgard. 2.  Alternate bowel habits. 3.  History of multiple adenomatous colon polyps.  Overdue for surveillance 4.  History of segmental resection of the sigmoid colon for diverticular disease 5.  Multiple significant medical problems.  Stable   PLAN:  1.  Continue IBgard 2.  Discussion on intestinal gas 3.  Daily fiber supplementation to regulate bowel habits 4.  Schedule surveillance colonoscopy.  The patient is high risk given his comorbidities.  We will adjust his diabetic medications preprocedure in an effort to avoid hypoglycemia.The nature of the procedure, as well as the risks, benefits, and alternatives were carefully and  thoroughly reviewed with the patient. Ample time for discussion and questions allowed. The patient understood, was satisfied, and agreed to proceed.  Total time of 50 minutes was spent preparing to see the patient, reviewing a myriad of tests, x-rays, and reports.  Obtaining comprehensive history and performing comprehensive physical examination.  Counseling the patient regarding multiple above listed issues.  Directing symptomatic therapies and scheduling endoscopic procedure.  Finally, documenting clinical information in the health record

## 2021-09-20 DIAGNOSIS — H0288A Meibomian gland dysfunction right eye, upper and lower eyelids: Secondary | ICD-10-CM | POA: Diagnosis not present

## 2021-09-20 DIAGNOSIS — H0288B Meibomian gland dysfunction left eye, upper and lower eyelids: Secondary | ICD-10-CM | POA: Diagnosis not present

## 2021-09-21 ENCOUNTER — Other Ambulatory Visit: Payer: Self-pay | Admitting: Family Medicine

## 2021-09-21 MED ORDER — MOLNUPIRAVIR EUA 200MG CAPSULE: 4.0000 | ORAL_CAPSULE | Freq: Two times a day (BID) | ORAL | 0 refills | Status: AC

## 2021-09-23 ENCOUNTER — Ambulatory Visit (INDEPENDENT_AMBULATORY_CARE_PROVIDER_SITE_OTHER): Payer: Medicare HMO

## 2021-09-23 ENCOUNTER — Telehealth: Payer: Self-pay | Admitting: Family Medicine

## 2021-09-23 DIAGNOSIS — I472 Ventricular tachycardia, unspecified: Secondary | ICD-10-CM | POA: Diagnosis not present

## 2021-09-23 LAB — CUP PACEART REMOTE DEVICE CHECK
Battery Remaining Longevity: 131 mo
Battery Voltage: 3.03 V
Brady Statistic RV Percent Paced: 0.03 %
Date Time Interrogation Session: 20230126023325
HighPow Impedance: 74 Ohm
Implantable Lead Implant Date: 20211028
Implantable Lead Location: 753860
Implantable Pulse Generator Implant Date: 20211028
Lead Channel Impedance Value: 437 Ohm
Lead Channel Impedance Value: 494 Ohm
Lead Channel Pacing Threshold Amplitude: 0.625 V
Lead Channel Pacing Threshold Pulse Width: 0.4 ms
Lead Channel Sensing Intrinsic Amplitude: 20.5 mV
Lead Channel Sensing Intrinsic Amplitude: 20.5 mV
Lead Channel Setting Pacing Amplitude: 2 V
Lead Channel Setting Pacing Pulse Width: 0.4 ms
Lead Channel Setting Sensing Sensitivity: 0.3 mV

## 2021-09-23 NOTE — Telephone Encounter (Signed)
Patient's spouse tested positive for COVID; has questions.  Please advise at [phone].

## 2021-09-23 NOTE — Telephone Encounter (Signed)
Spoke with pt's wife (DPR) and she states they were just wondering if he also needed to be in quarantine. Advised her if he is currently asymptomatic he would not need to quarantine. She states he is planning to go out with friends tomorrow so they were concerned about him spreading COVID. I suggested he take an at-home test and if going to be around anyone that is immune compromised he may want to wear a mask just to be safe. She will have him take a test later today. Nothing further needed at this time.

## 2021-09-24 ENCOUNTER — Telehealth: Payer: Self-pay | Admitting: *Deleted

## 2021-09-24 ENCOUNTER — Telehealth: Payer: Medicare HMO

## 2021-09-24 NOTE — Telephone Encounter (Signed)
°  Care Management   Follow Up Note   09/24/2021 Name: Cameron Daniel MRN: [id number] DOB: 1946/07/24   Referred by: [name], MD Reason for referral : Chronic Care Management (CHF, DM2)   Telephone call to patient for assessment, spoke with patient who reports he is busy and has company and this is not a good time or day to talk, requests call back another day.  Follow Up Plan: Telephone follow up appointment with care management team member scheduled for:  10/04/21 at [place] am.  Jacqlyn Larsen [facility], BSN RN Case Manager [facility] [phone]

## 2021-09-29 NOTE — Progress Notes (Signed)
Cardiology Office Note:    Date:  10/11/2021   ID:  Cameron Daniel, Cameron Daniel [birth year], MRN [id number]  PCP:  [name], MD   [facility] Providers Cardiologist:  Freada Bergeron, MD Electrophysiologist:  Vickie Epley, MD  Advanced Heart Failure:  Glori Bickers, MD {   Referring MD: [name], MD    History of Present Illness:    Cameron Daniel is a 76 y.o. male with a hx of CAD s/p CABG with LIMA-LAD, SVG-OM, SVG-dRCA, systolic HF with LVEF 28-36%, VT s/p ablation 05/2020 with ICD implantation, HTN, and HLD who was previously followed by Dr. Meda Coffee who now returns to clinic for follow-up.  Per review of the record, the patient had MI in 1985 with unclear intervention, treated by [name] at that time. Aortoiliac duplex 2014 showed no AAA. He did well for many years before presenting back to the ED August 2020 with fever and malaise. He was seen in the ER and given fluids and discharged but returned back with respiratory failure requiring intubation for PNA and CHF and NSTEMI. 2D echo 04/13/19 showed EF 25-30%, severe hypokinesis of the left ventricular, entire inferior wall and anteroseptal wall, mild LAE, small pericardial effusion. Covid testing was negative. Cath showed 3V CAD with low output. He was started on milrinone post-cath. He developed a large spontaneous RP bleed post cath (cath all done from arm). He ultimately underwent CABG on 04/26/19. Pre-op TEE EF 45-50%. Post CABG course was fortunately uneventful. Spironolactone was later stopped due to hyperkalemia, olmestartan stopped due to hypotension, and he followed with CHF team for a period of time before graduating from their services. He has since tolerated losartan OK. In 05/2020 he was admitted with persistent VT. He was initially treated with amiodarone but ultimately cardioverted in ED. LHC showed patent grafts without specific culprit. He was seen by EP and underwent EPS/ablation with scar  modification and ICD implantation. 2D Echo 05/2020 showed EF 45-50%, mild LVH, grade 2 DD, moderate LAE, normal RV.  Last saw Melina Copa in clinic on 03/2021 where he was doing well from a CV standpoint. Remained active without symptoms.   Today, the patient overall feels well. Denies chest pain, SOB, LE edema, orthopnea, or palpitations. No exertional symptoms. No ICD discharges. HR has been controlled. BP is well controlled. Compliant with medications. Has some trouble with balance, but no falls.    Past Medical History:  Diagnosis Date   Allergic rhinitis, cause unspecified    Allergy    Anxiety    Arthritis    Cancer ([place])    Carotid Doppler 04/2020   Carotid US 9/21: No evidence of ICA stenosis bilaterally; right vertebral artery stenosis   Cataract    Chronic combined systolic (congestive) and diastolic (congestive) heart failure (HCC)    Coronary atherosclerosis of unspecified type of vessel, native or graft    a. MI 1985 with unclear details. b. CABG 03/2019 with LIMA -> LAD, SVG -> OM, SVG to dRCA.   Depression    PTSD    Diverticulosis of colon (without mention of hemorrhage)    Esophageal reflux    Esophageal stricture    Essential hypertension    Former tobacco use    Hemorrhoids    Hyperlipidemia    ICD (implantable cardioverter-defibrillator) in place    Irritable bowel syndrome    Ischemic cardiomyopathy    Mild carotid artery disease ([name])    a. 1-39% bilaterally 03/2019 duplex.  Myocardial infarction (HCC)    Nocturia    Obesity, unspecified    Orthostasis    Other acquired absence of organ    Personal history of colonic polyps    Psychosexual dysfunction with inhibited sexual excitement    Retroperitoneal bleed    Type II or unspecified type diabetes mellitus without mention of complication, not stated as uncontrolled    VT (ventricular tachycardia)     Past Surgical History:  Procedure Laterality Date   ADENOIDECTOMY     CARPAL TUNNEL RELEASE      left    CHOLECYSTECTOMY     COLON RESECTION     18 inches   COLON SURGERY  2013   CORONARY ANGIOPLASTY     1985    CORONARY ARTERY BYPASS GRAFT N/A 04/26/2019   Procedure: CORONARY ARTERY BYPASS GRAFTING (CABG) x Three, using left internal mammary artery and right leg greater saphenous vein harvested endoscopically;  Surgeon: Gaye Pollack, MD;  Location: [facility];  Service: Open Heart Surgery;  Laterality: N/A;   ELBOW BURSA SURGERY     EYE SURGERY     ICD IMPLANT N/A 06/25/2020   Procedure: ICD IMPLANT;  Surgeon: Vickie Epley, MD;  Location: [facility];  Service: Cardiovascular;  Laterality: N/A;   LEFT HEART CATH AND CORS/GRAFTS ANGIOGRAPHY N/A 06/19/2020   Procedure: LEFT HEART CATH AND CORS/GRAFTS ANGIOGRAPHY;  Surgeon: Troy Sine, MD;  Location: [facility];  Service: Cardiovascular;  Laterality: N/A;   POLYPECTOMY     RIGHT/LEFT HEART CATH AND CORONARY ANGIOGRAPHY N/A 04/16/2019   Procedure: RIGHT/LEFT HEART CATH AND CORONARY ANGIOGRAPHY;  Surgeon: Belva Crome, MD;  Location: [facility];  Service: Cardiovascular;  Laterality: N/A;   SHOULDER SURGERY     Rt. Shoulder   TEE WITHOUT CARDIOVERSION N/A 04/26/2019   Procedure: TRANSESOPHAGEAL ECHOCARDIOGRAM (TEE);  Surgeon: Gaye Pollack, MD;  Location: [facility];  Service: Open Heart Surgery;  Laterality: N/A;   TONSILLECTOMY     V TACH ABLATION N/A 06/22/2020   Procedure: V TACH ABLATION;  Surgeon: Vickie Epley, MD;  Location: [facility];  Service: Cardiovascular;  Laterality: N/A;    Current Medications: Current Meds  Medication Sig   acetaminophen (TYLENOL) 500 MG tablet Take 1,000 mg by mouth at bedtime as needed for mild pain.   aspirin EC 81 MG tablet Take 1 tablet (81 mg total) by mouth daily.   azelastine (ASTELIN) 0.1 % nasal spray Place 2 sprays into both nostrils 2 (two) times daily. Use in each nostril as directed   carvedilol (COREG) 6.25 MG tablet Take 1 tablet (6.25 mg  total) by mouth 2 (two) times daily with a meal.   citalopram (CELEXA) 10 MG tablet TAKE 1 TABLET BY MOUTH EVERY DAY   colestipol (COLESTID) 5 g packet Take 5 g by mouth daily.    diphenoxylate-atropine (LOMOTIL) 2.5-0.025 MG tablet Take 2 tablets by mouth 4 (four) times daily as needed for diarrhea or loose stools.   fexofenadine (ALLEGRA) 180 MG tablet Take 1 tablet (180 mg total) by mouth daily as needed. For allergies   finasteride (PROSCAR) 5 MG tablet TAKE 1 TABLET BY MOUTH EVERY DAY   JANUVIA 50 MG tablet TAKE 1 TABLET BY MOUTH EVERY DAY   JARDIANCE 25 MG TABS tablet TAKE 25 MG (1 TABLET) BY MOUTH DAILY BEFORE BREAKFAST. STOP PIOGLITAZONE   losartan (COZAAR) 25 MG tablet TAKE 1 TABLET BY MOUTH EVERY DAY  loteprednol (LOTEMAX) 0.5 % ophthalmic suspension Place 1 drop into both eyes 4 (four) times daily as needed (itching).   magnesium oxide (MAG-OX) 400 MG tablet Take 1 tablet (400 mg total) by mouth 2 (two) times daily.   montelukast (SINGULAIR) 10 MG tablet TAKE 1 TABLET BY MOUTH EVERYDAY AT BEDTIME   OVER THE COUNTER MEDICATION Balance of Nature   pantoprazole (PROTONIX) 40 MG tablet TAKE 1 TABLET BY MOUTH EVERY DAY IN THE MORNING   rosuvastatin (CRESTOR) 40 MG tablet TAKE 1 TABLET (40 MG TOTAL) BY MOUTH DAILY AT 6 PM.   Saccharomyces boulardii (PROBIOTIC) 250 MG CAPS Take 1 capsule by mouth daily.   vitamin B-12 (CYANOCOBALAMIN) 100 MCG tablet Take 100 mcg by mouth daily.     Allergies:   Doxazosin mesylate, Losartan, Testosterone, Flomax [tamsulosin hcl], Penicillins, Sulfonamide derivatives, and Trulicity [dulaglutide]   Social History   Socioeconomic History   Marital status: Married    Spouse name: Not on file   Number of children: 1   Years of education: 12   Highest education level: High school graduate  Occupational History   Occupation: [employer]  Tobacco Use   Smoking status: Former    Types: Cigarettes    Quit date: 08/30/1983    Years since quitting: 38.1    Smokeless tobacco: Former   Tobacco comments:    Quit 30 years ago.  Vaping Use   Vaping Use: Never used  Substance and Sexual Activity   Alcohol use: Yes    Comment: rare   Drug use: No   Sexual activity: Yes  Other Topics Concern   Not on file  Social History Narrative   Not on file   Social Determinants of Health   Financial Resource Strain: Low Risk    Difficulty of Paying Living Expenses: Not hard at all  Food Insecurity: No Food Insecurity   Worried About Charity fundraiser in the Last Year: Never true   [place] in the Last Year: Never true  Transportation Needs: No Transportation Needs   Lack of Transportation (Medical): No   Lack of Transportation (Non-Medical): No  Physical Activity: Inactive   Days of Exercise per Week: 0 days   Minutes of Exercise per Session: 0 min  Stress: No Stress Concern Present   Feeling of Stress : Not at all  Social Connections: Moderately Isolated   Frequency of Communication with Friends and Family: More than three times a week   Frequency of Social Gatherings with Friends and Family: More than three times a week   Attends Religious Services: Never   Marine scientist or Organizations: No   Attends Music therapist: Never   Marital Status: Married     Family History: The patient's family history includes Allergic rhinitis in his father; Coronary artery disease in his mother; Lung cancer in his father. There is no history of Colon cancer, Heart attack, Hypertension, Stroke, Esophageal cancer, Rectal cancer, Stomach cancer, or Pancreatic cancer.  ROS:   Please see the history of present illness.    Review of Systems  Constitutional:  Negative for malaise/fatigue.  HENT:  Positive for hearing loss.   Respiratory:  Negative for sputum production.   Cardiovascular:  Negative for chest pain, palpitations, orthopnea, claudication, leg swelling and PND.  Gastrointestinal:  Negative for nausea and vomiting.   Genitourinary:  Negative for hematuria.  Musculoskeletal:  Positive for falls and myalgias.  Neurological:  Negative for dizziness and loss of  consciousness.    EKGs/Labs/Other Studies Reviewed:    The following studies were reviewed today: TTE Jul 07, 2020: IMPRESSIONS   1. Inferior and inferoseptal hypokinesis. Left ventricular ejection  fraction, by estimation, is 45 to 50%. The left ventricle has mildly  decreased function. The left ventricle demonstrates regional wall motion  abnormalities (see scoring diagram/findings   for description). There is mild concentric left ventricular hypertrophy.  Left ventricular diastolic parameters are consistent with Grade II  diastolic dysfunction (pseudonormalization).   2. Right ventricular systolic function is normal. The right ventricular  size is normal.   3. Left atrial size was moderately dilated.   4. The mitral valve is normal in structure. Trivial mitral valve  regurgitation. No evidence of mitral stenosis. Moderate mitral annular  calcification.   5. The aortic valve is calcified. There is mild calcification of the  aortic valve. There is mild thickening of the aortic valve. Aortic valve  regurgitation is not visualized. No aortic stenosis is present.   6. The inferior vena cava is dilated in size with <50% respiratory  variability, suggesting right atrial pressure of 15 mmHg.   LHC 05/2020: Ost LAD lesion is 80% stenosed. Prox LAD to Mid LAD lesion is 90% stenosed. Mid LAD lesion is 95% stenosed. Mid RCA lesion is 85% stenosed. Mid LM to Dist LM lesion is 70% stenosed. Prox Cx to Mid Cx lesion is 85% stenosed. Mid Cx to Dist Cx lesion is 95% stenosed. Non-stenotic Prox RCA lesion. Prox Graft lesion is 20% stenosed.   Severe native multivessel CAD with 70% distal left main stenosis, 80% ostial LAD stenosis followed by segmental 90% stenoses before and after the first diagonal vessel with 95% stenosis in the midsegment proximal to  a septal perforating artery with competitive filling seen distally; diffuse 80 to 95% circumflex stenoses; ectatic proximal RCA with 80% mid stenoses.   Patent LIMA graft supplying the mid LAD.   Patent SVG supplying the distal circumflex marginal vessel branch.   Patent SVG supplying the distal RCA with mild smooth 20% focal proximal to mid narrowing.   Fairly preserved global LV function with EF estimated 50 to 55%.  There is a suggestion of possible apical hypertrophy with nipple protrusion of the apex.  LVEDP 82mmHg.   RECOMMENDATION: The patient's bypass conduits are widely patent.  Further EP evaluation for possible defibrillator or VT ablation.  EKG:  No new tracing  Recent Labs: 04/08/2021: Magnesium 2.0 07/30/2021: ALT 29; BUN 14; Creat 0.82; Hemoglobin 16.4; Platelets 171; Potassium 4.3; Sodium 136  Recent Lipid Panel    Component Value Date/Time   CHOL 121 07/30/2021 1433   TRIG 224 (H) 07/30/2021 1433   TRIG 193 (H) 08/08/2006 0924   HDL 48 07/30/2021 1433   CHOLHDL 2.5 07/30/2021 1433   VLDL 40 (H) 12/15/2016 0815   LDLCALC 45 07/30/2021 1433   LDLDIRECT 49.9 08/26/2013 1011          Physical Exam:    VS:  BP 128/70    Pulse 80    Ht 5\' 11"  (1.803 m)    Wt 227 lb 6.4 oz (103.1 kg)    SpO2 95%    BMI 31.72 kg/m     Wt Readings from Last 3 Encounters:  10/11/21 227 lb 6.4 oz (103.1 kg)  10/01/21 226 lb (102.5 kg)  09/16/21 226 lb (102.5 kg)     GEN:  Well nourished, well developed in no acute distress HEENT: Normal NECK: No JVD; No carotid bruits CARDIAC:  RRR, no murmurs, rubs, gallops RESPIRATORY:  Clear to auscultation without rales, wheezing or rhonchi  ABDOMEN: Soft, non-tender, non-distended MUSCULOSKELETAL:  No edema; No deformity  SKIN: Warm and dry NEUROLOGIC:  Alert and oriented x 3 PSYCHIATRIC:  Normal affect   ASSESSMENT:    1. Ventricular tachycardia   2. Chronic systolic heart failure ([place])   3. CAD in native artery   4. Essential  hypertension   5. Hyperlipidemia LDL goal <70    PLAN:    In order of problems listed above:  #CAD s/p CABG: Patient is s/p CABG with LIMA-LAD, SVG-OM, SVG to dRCA in 03/2019. Most recent cath in 1-/2021 with patent grafts. Currently doing well without anginal symptoms.  -Continue ASA 81mg  daily -Continue crestor 40mg  daily -Continue losartan 25mg  daily -Continue coreg 6.25mg  BID  #Chronic Combined Systolic and Diastolic HF: LVEF improved to 45-50% on TTE 05/2020 from nadir 25-30% in 2020. Doing well and compensated on exam. NYHA class I-II symptoms. -Continue losartan 25mg  daily -Continue coreg 6.25mg  BID -Continue jardiance 10mg  daily -Low Na diet; monitoring weights  #VT s/p ablation: Underwent VT ablation and ICD implantation with Dr. Quentin Ore. Doing well.  -Follow-up with EP as scheduled -Continue coreg 6.25mg  BID  #HTN: -Continue losartan 25mg  daily -Continue coreg 6.25mg  BID      Medication Adjustments/Labs and Tests Ordered: Current medicines are reviewed at length with the patient today.  Concerns regarding medicines are outlined above.  No orders of the defined types were placed in this encounter.  No orders of the defined types were placed in this encounter.   Patient Instructions  Medication Instructions:  Your physician recommends that you continue on your current medications as directed. Please refer to the Current Medication list given to you today.  *If you need a refill on your cardiac medications before your next appointment, please call your pharmacy*   Lab Work: None ordered  If you have labs (blood work) drawn today and your tests are completely normal, you will receive your results only by: [place] (if you have MyChart) OR A paper copy in the mail If you have any lab test that is abnormal or we need to change your treatment, we will call you to review the results.   Testing/Procedures: None ordered   Follow-Up: At [facility],  you and your health needs are our priority.  As part of our continuing mission to provide you with exceptional heart care, we have created designated Provider Care Teams.  These Care Teams include your primary Cardiologist (physician) and Advanced Practice Providers (APPs -  Physician Assistants and Nurse Practitioners) who all work together to provide you with the care you need, when you need it.  We recommend signing up for the patient portal called "MyChart".  Sign up information is provided on this After Visit Summary.  MyChart is used to connect with patients for Virtual Visits (Telemedicine).  Patients are able to view lab/test results, encounter notes, upcoming appointments, etc.  Non-urgent messages can be sent to your provider as well.   To learn more about what you can do with MyChart, go to [email or web address].    Your next appointment:   6 month(s)  The format for your next appointment:   In Person  Provider:   Freada Bergeron, MD     Other Instructions    Signed, Freada Bergeron, MD  10/11/2021 10:21 AM    [place]

## 2021-10-01 ENCOUNTER — Other Ambulatory Visit: Payer: Self-pay

## 2021-10-01 ENCOUNTER — Encounter: Payer: Self-pay | Admitting: Family Medicine

## 2021-10-01 ENCOUNTER — Ambulatory Visit (INDEPENDENT_AMBULATORY_CARE_PROVIDER_SITE_OTHER): Payer: Medicare HMO | Admitting: Family Medicine

## 2021-10-01 VITALS — BP 128/82 | HR 72 | Temp 97.3°F | Resp 18 | Ht 71.0 in | Wt 226.0 lb

## 2021-10-01 DIAGNOSIS — M76829 Posterior tibial tendinitis, unspecified leg: Secondary | ICD-10-CM | POA: Insufficient documentation

## 2021-10-01 DIAGNOSIS — M76822 Posterior tibial tendinitis, left leg: Secondary | ICD-10-CM | POA: Insufficient documentation

## 2021-10-01 DIAGNOSIS — M76821 Posterior tibial tendinitis, right leg: Secondary | ICD-10-CM | POA: Insufficient documentation

## 2021-10-01 DIAGNOSIS — R269 Unspecified abnormalities of gait and mobility: Secondary | ICD-10-CM | POA: Diagnosis not present

## 2021-10-01 DIAGNOSIS — R1031 Right lower quadrant pain: Secondary | ICD-10-CM

## 2021-10-01 DIAGNOSIS — M25571 Pain in right ankle and joints of right foot: Secondary | ICD-10-CM | POA: Diagnosis not present

## 2021-10-01 DIAGNOSIS — M67961 Unspecified disorder of synovium and tendon, right lower leg: Secondary | ICD-10-CM | POA: Diagnosis not present

## 2021-10-01 DIAGNOSIS — M79671 Pain in right foot: Secondary | ICD-10-CM | POA: Diagnosis not present

## 2021-10-01 NOTE — Progress Notes (Signed)
Subjective:    Patient ID: Cameron Daniel, male    DOB: Mar 07, 1946, 76 y.o.   MRN: [id number]  HPI Patient reports pain and pressure in the right inguinal canal.  He states its been present ever since he had a catheterization.  Post operative course was complicated by hematoma in his right flank.  He states that he feels pain radiate from his right inguinal canal into his left flank.  He has normal flexion and extension of the right hip with normal internal and external rotation.  There is no inguinal canal masses palpated on Valsalva.  There is no testicular masses.  There is no lymphadenopathy.  However palpation around the right femoral artery does elicit pain.  There is no palpable abnormality Past Medical History:  Diagnosis Date   Allergic rhinitis, cause unspecified    Allergy    Anxiety    Arthritis    Cancer ([place])    Carotid Doppler 04/2020   Carotid US 9/21: No evidence of ICA stenosis bilaterally; right vertebral artery stenosis   Cataract    Chronic combined systolic (congestive) and diastolic (congestive) heart failure (HCC)    Coronary atherosclerosis of unspecified type of vessel, native or graft    a. MI 1985 with unclear details. b. CABG 03/2019 with LIMA -> LAD, SVG -> OM, SVG to dRCA.   Depression    PTSD    Diverticulosis of colon (without mention of hemorrhage)    Esophageal reflux    Esophageal stricture    Essential hypertension    Former tobacco use    Hemorrhoids    Hyperlipidemia    ICD (implantable cardioverter-defibrillator) in place    Irritable bowel syndrome    Ischemic cardiomyopathy    Mild carotid artery disease ([place])    a. 1-39% bilaterally 03/2019 duplex.   Myocardial infarction (HCC)    Nocturia    Obesity, unspecified    Orthostasis    Other acquired absence of organ    Personal history of colonic polyps    Psychosexual dysfunction with inhibited sexual excitement    Retroperitoneal bleed    Type II or unspecified type diabetes  mellitus without mention of complication, not stated as uncontrolled    VT (ventricular tachycardia)    Past Surgical History:  Procedure Laterality Date   ADENOIDECTOMY     CARPAL TUNNEL RELEASE     left    CHOLECYSTECTOMY     COLON RESECTION     18 inches   COLON SURGERY  2013   CORONARY ANGIOPLASTY     1985    CORONARY ARTERY BYPASS GRAFT N/A 04/26/2019   Procedure: CORONARY ARTERY BYPASS GRAFTING (CABG) x Three, using left internal mammary artery and right leg greater saphenous vein harvested endoscopically;  Surgeon: Gaye Pollack, MD;  Location: [facility];  Service: Open Heart Surgery;  Laterality: N/A;   ELBOW BURSA SURGERY     EYE SURGERY     ICD IMPLANT N/A 06/25/2020   Procedure: ICD IMPLANT;  Surgeon: Vickie Epley, MD;  Location: [facility];  Service: Cardiovascular;  Laterality: N/A;   LEFT HEART CATH AND CORS/GRAFTS ANGIOGRAPHY N/A 06/19/2020   Procedure: LEFT HEART CATH AND CORS/GRAFTS ANGIOGRAPHY;  Surgeon: Troy Sine, MD;  Location: [facility];  Service: Cardiovascular;  Laterality: N/A;   POLYPECTOMY     RIGHT/LEFT HEART CATH AND CORONARY ANGIOGRAPHY N/A 04/16/2019   Procedure: RIGHT/LEFT HEART CATH AND CORONARY ANGIOGRAPHY;  Surgeon: Belva Crome, MD;  Location: [facility];  Service: Cardiovascular;  Laterality: N/A;   SHOULDER SURGERY     Rt. Shoulder   TEE WITHOUT CARDIOVERSION N/A 04/26/2019   Procedure: TRANSESOPHAGEAL ECHOCARDIOGRAM (TEE);  Surgeon: Gaye Pollack, MD;  Location: [facility];  Service: Open Heart Surgery;  Laterality: N/A;   TONSILLECTOMY     V TACH ABLATION N/A 06/22/2020   Procedure: V TACH ABLATION;  Surgeon: Vickie Epley, MD;  Location: [facility];  Service: Cardiovascular;  Laterality: N/A;   Current Outpatient Medications on File Prior to Visit  Medication Sig Dispense Refill   acetaminophen (TYLENOL) 500 MG tablet Take 1,000 mg by mouth at bedtime as needed for mild pain.     aspirin EC 81 MG  tablet Take 1 tablet (81 mg total) by mouth daily. 90 tablet 3   azelastine (ASTELIN) 0.1 % nasal spray Place 2 sprays into both nostrils 2 (two) times daily. Use in each nostril as directed 30 mL 12   carvedilol (COREG) 6.25 MG tablet Take 1 tablet (6.25 mg total) by mouth 2 (two) times daily with a meal. 180 tablet 3   citalopram (CELEXA) 10 MG tablet TAKE 1 TABLET BY MOUTH EVERY DAY 90 tablet 2   colestipol (COLESTID) 5 g packet Take 5 g by mouth daily.      diphenoxylate-atropine (LOMOTIL) 2.5-0.025 MG tablet Take 2 tablets by mouth 4 (four) times daily as needed for diarrhea or loose stools. 30 tablet 0   fexofenadine (ALLEGRA) 180 MG tablet Take 1 tablet (180 mg total) by mouth daily as needed. For allergies 90 tablet 0   finasteride (PROSCAR) 5 MG tablet TAKE 1 TABLET BY MOUTH EVERY DAY 90 tablet 3   JANUVIA 50 MG tablet TAKE 1 TABLET BY MOUTH EVERY DAY 90 tablet 1   JARDIANCE 25 MG TABS tablet TAKE 25 MG (1 TABLET) BY MOUTH DAILY BEFORE BREAKFAST. STOP PIOGLITAZONE 90 tablet 1   losartan (COZAAR) 25 MG tablet TAKE 1 TABLET BY MOUTH EVERY DAY 90 tablet 2   loteprednol (LOTEMAX) 0.5 % ophthalmic suspension Place 1 drop into both eyes 4 (four) times daily as needed (itching). 5 mL 1   magnesium oxide (MAG-OX) 400 MG tablet Take 1 tablet (400 mg total) by mouth 2 (two) times daily. 60 tablet 11   montelukast (SINGULAIR) 10 MG tablet TAKE 1 TABLET BY MOUTH EVERYDAY AT BEDTIME 90 tablet 1   OVER THE COUNTER MEDICATION Balance of Nature     pantoprazole (PROTONIX) 40 MG tablet TAKE 1 TABLET BY MOUTH EVERY DAY IN THE MORNING 90 tablet 3   predniSONE (DELTASONE) 20 MG tablet Take 2 tablets (40 mg total) by mouth daily with breakfast. 14 tablet 0   rosuvastatin (CRESTOR) 40 MG tablet TAKE 1 TABLET (40 MG TOTAL) BY MOUTH DAILY AT 6 PM. 90 tablet 3   Saccharomyces boulardii (PROBIOTIC) 250 MG CAPS Take 1 capsule by mouth daily.     vitamin B-12 (CYANOCOBALAMIN) 100 MCG tablet Take 100 mcg by mouth  daily.     No current facility-administered medications on file prior to visit.   Allergies  Allergen Reactions   Doxazosin Mesylate Other (See Comments)    Drops in blood pressure    Losartan     Other reaction(s): Muscle pain   Testosterone     Other reaction(s): Erythrocytosis   Flomax [Tamsulosin Hcl] Itching and Other (See Comments)    Drops in blood pressure     Penicillins Rash and Other (  See Comments)    Has patient had a PCN reaction causing immediate rash, facial/tongue/throat swelling, SOB or lightheadedness with hypotension: No Has patient had a PCN reaction causing severe rash involving mucus membranes or skin necrosis: No Has patient had a PCN reaction that required hospitalization No Has patient had a PCN reaction occurring within the last 10 years: No If all of the above answers are "NO", then may proceed with Cephalosporin use.    Sulfonamide Derivatives Rash   Trulicity [Dulaglutide] Nausea And Vomiting    N&V, Dizziness   Social History   Socioeconomic History   Marital status: Married    Spouse name: Not on file   Number of children: 1   Years of education: 12   Highest education level: High school graduate  Occupational History   Occupation: [employer]  Tobacco Use   Smoking status: Former    Types: Cigarettes    Quit date: 08/30/1983    Years since quitting: 38.1   Smokeless tobacco: Former   Tobacco comments:    Quit 30 years ago.  Vaping Use   Vaping Use: Never used  Substance and Sexual Activity   Alcohol use: Yes    Comment: rare   Drug use: No   Sexual activity: Yes  Other Topics Concern   Not on file  Social History Narrative   Not on file   Social Determinants of Health   Financial Resource Strain: Low Risk    Difficulty of Paying Living Expenses: Not hard at all  Food Insecurity: No Food Insecurity   Worried About Charity fundraiser in the Last Year: Never true   [place] in the Last Year: Never true  Transportation Needs:  No Transportation Needs   Lack of Transportation (Medical): No   Lack of Transportation (Non-Medical): No  Physical Activity: Inactive   Days of Exercise per Week: 0 days   Minutes of Exercise per Session: 0 min  Stress: No Stress Concern Present   Feeling of Stress : Not at all  Social Connections: Moderately Isolated   Frequency of Communication with Friends and Family: More than three times a week   Frequency of Social Gatherings with Friends and Family: More than three times a week   Attends Religious Services: Never   Marine scientist or Organizations: No   Attends Music therapist: Never   Marital Status: Married  Human resources officer Violence: Not At Risk   Fear of Current or Ex-Partner: No   Emotionally Abused: No   Physically Abused: No   Sexually Abused: No     Review of Systems     Objective:   Physical Exam Vitals reviewed.  Constitutional:      Appearance: Normal appearance.  Eyes:     General: Lids are normal.        Left eye: No discharge.     Extraocular Movements:     Right eye: Normal extraocular motion.     Left eye: Normal extraocular motion.     Conjunctiva/sclera:     Right eye: Right conjunctiva is injected.     Left eye: Left conjunctiva is injected.  Cardiovascular:     Rate and Rhythm: Normal rate and regular rhythm.     Pulses: Normal pulses.     Heart sounds: Normal heart sounds. No murmur heard. Pulmonary:     Effort: Pulmonary effort is normal. No respiratory distress.     Breath sounds: Normal breath sounds. No wheezing, rhonchi  or rales.  Musculoskeletal:     Right hip: Tenderness present. No deformity, bony tenderness or crepitus. Normal range of motion.     Left hip: No tenderness, bony tenderness or crepitus. Normal range of motion.     Right lower leg: No edema.     Left lower leg: No edema.     Right foot: Normal capillary refill. Tenderness present. No swelling or crepitus.     Left foot: Normal capillary  refill. No swelling, tenderness or crepitus.       Legs:  Neurological:     Mental Status: He is alert.          Assessment & Plan:  Right groin pain Believe the pain in his groin either represents muscle pain or perhaps scar tissue.  I see no abnormality in his hip exam.  We discussed physical therapy but he declines.  Pain is mild.  It is tolerable.  If the pain worsens I would recommend imaging of the right hip  however again I suspect this is most likely muscular

## 2021-10-04 ENCOUNTER — Ambulatory Visit (INDEPENDENT_AMBULATORY_CARE_PROVIDER_SITE_OTHER): Payer: Medicare HMO | Admitting: *Deleted

## 2021-10-04 DIAGNOSIS — E1169 Type 2 diabetes mellitus with other specified complication: Secondary | ICD-10-CM

## 2021-10-04 DIAGNOSIS — E785 Hyperlipidemia, unspecified: Secondary | ICD-10-CM

## 2021-10-04 DIAGNOSIS — I5022 Chronic systolic (congestive) heart failure: Secondary | ICD-10-CM

## 2021-10-04 NOTE — Progress Notes (Signed)
Remote ICD transmission.   

## 2021-10-04 NOTE — Patient Instructions (Signed)
Visit Information  Thank you for taking time to visit with me today. Please don't hesitate to contact me if I can be of assistance to you before our next scheduled telephone appointment.  Following are the goals we discussed today Take medications as prescribed   Attend all scheduled provider appointments Perform all self care activities independently  Perform IADL's (shopping, preparing meals, housekeeping, managing finances) independently Call provider office for new concerns or questions  call office if I gain more than 2 pounds in one day or 5 pounds in one week do ankle pumps when sitting track weight in diary watch for swelling in feet, ankles and legs every day weigh myself daily follow rescue plan if symptoms flare-up eat more whole grains, fruits and vegetables, lean meats and healthy fats dress right for the weather, hot or cold check blood sugar at prescribed times: per instructions by primary care provider  check feet daily for cuts, sores or redness enter blood sugar readings and medication or insulin into daily log take the blood sugar log to all doctor visits take the blood sugar meter to all doctor visits trim toenails straight across fill half of plate with vegetables wash and dry feet carefully every day wear comfortable, cotton socks Eat adequate fiber in diet Follow low sodium diet Continue doing some type of exercise daily- walking is good  Our next appointment is by telephone on 12/20/21 at 9 am  Please call the care guide team at [phone] if you need to cancel or reschedule your appointment.   If you are experiencing a Mental Health or [place] or need someone to talk to, please call the Suicide and Crisis Lifeline: 988 call the [place] National Suicide Prevention Lifeline: 615-065-7045 or TTY: (231)560-5550 TTY (906)106-8628) to talk to a trained counselor call 1-800-273-TALK (toll free, 24 hour hotline) go to [facility] [place] [phone]) call 911   The patient verbalized understanding of instructions, educational materials, and care plan provided today and declined offer to receive copy of patient instructions, educational materials, and care plan.   Jacqlyn Larsen RNC, BSN RN Case Manager [facility] [phone]

## 2021-10-04 NOTE — Chronic Care Management (AMB) (Signed)
Chronic Care Management   CCM RN Visit Note  10/04/2021 Name: Cameron Daniel MRN: [id number] DOB: 03-Mar-1946  Subjective: Cameron Daniel is a 76 y.o. year old male who is a primary care patient of Pickard, [name], MD. The care management team was consulted for assistance with disease management and care coordination needs.    Engaged with patient by telephone for follow up visit in response to provider referral for case management and/or care coordination services.   Consent to Services:  The patient was given information about Chronic Care Management services, agreed to services, and gave verbal consent prior to initiation of services.  Please see initial visit note for detailed documentation.   Patient agreed to services and verbal consent obtained.   Assessment: Review of patient past medical history, allergies, medications, health status, including review of consultants reports, laboratory and other test data, was performed as part of comprehensive evaluation and provision of chronic care management services.   SDOH (Social Determinants of Health) assessments and interventions performed:    CCM Care Plan  Allergies  Allergen Reactions   Doxazosin Mesylate Other (See Comments)    Drops in blood pressure    Losartan     Other reaction(s): Muscle pain   Testosterone     Other reaction(s): Erythrocytosis   Flomax [Tamsulosin Hcl] Itching and Other (See Comments)    Drops in blood pressure     Penicillins Rash and Other (See Comments)    Has patient had a PCN reaction causing immediate rash, facial/tongue/throat swelling, SOB or lightheadedness with hypotension: No Has patient had a PCN reaction causing severe rash involving mucus membranes or skin necrosis: No Has patient had a PCN reaction that required hospitalization No Has patient had a PCN reaction occurring within the last 10 years: No If all of the above answers are "NO", then may proceed with Cephalosporin  use.    Sulfonamide Derivatives Rash   Trulicity [Dulaglutide] Nausea And Vomiting    N&V, Dizziness    Outpatient Encounter Medications as of 10/04/2021  Medication Sig   acetaminophen (TYLENOL) 500 MG tablet Take 1,000 mg by mouth at bedtime as needed for mild pain.   aspirin EC 81 MG tablet Take 1 tablet (81 mg total) by mouth daily.   azelastine (ASTELIN) 0.1 % nasal spray Place 2 sprays into both nostrils 2 (two) times daily. Use in each nostril as directed   carvedilol (COREG) 6.25 MG tablet Take 1 tablet (6.25 mg total) by mouth 2 (two) times daily with a meal.   citalopram (CELEXA) 10 MG tablet TAKE 1 TABLET BY MOUTH EVERY DAY   colestipol (COLESTID) 5 g packet Take 5 g by mouth daily.    diphenoxylate-atropine (LOMOTIL) 2.5-0.025 MG tablet Take 2 tablets by mouth 4 (four) times daily as needed for diarrhea or loose stools.   fexofenadine (ALLEGRA) 180 MG tablet Take 1 tablet (180 mg total) by mouth daily as needed. For allergies   finasteride (PROSCAR) 5 MG tablet TAKE 1 TABLET BY MOUTH EVERY DAY   JANUVIA 50 MG tablet TAKE 1 TABLET BY MOUTH EVERY DAY   JARDIANCE 25 MG TABS tablet TAKE 25 MG (1 TABLET) BY MOUTH DAILY BEFORE BREAKFAST. STOP PIOGLITAZONE   losartan (COZAAR) 25 MG tablet TAKE 1 TABLET BY MOUTH EVERY DAY   loteprednol (LOTEMAX) 0.5 % ophthalmic suspension Place 1 drop into both eyes 4 (four) times daily as needed (itching).   magnesium oxide (MAG-OX) 400 MG tablet Take 1 tablet (400 mg  total) by mouth 2 (two) times daily.   montelukast (SINGULAIR) 10 MG tablet TAKE 1 TABLET BY MOUTH EVERYDAY AT BEDTIME   OVER THE COUNTER MEDICATION Balance of Nature   pantoprazole (PROTONIX) 40 MG tablet TAKE 1 TABLET BY MOUTH EVERY DAY IN THE MORNING   rosuvastatin (CRESTOR) 40 MG tablet TAKE 1 TABLET (40 MG TOTAL) BY MOUTH DAILY AT 6 PM.   Saccharomyces boulardii (PROBIOTIC) 250 MG CAPS Take 1 capsule by mouth daily.   vitamin B-12 (CYANOCOBALAMIN) 100 MCG tablet Take 100 mcg by  mouth daily.   No facility-administered encounter medications on file as of 10/04/2021.    Patient Active Problem List   Diagnosis Date Noted   Posterior tibial tendon dysfunction (PTTD) of both lower extremities 10/01/2021   Abnormal gait 10/01/2021   Neck pain on right side 10/28/2020   Urolithiasis 08/06/2020   Hypercalcemia 08/06/2020   Ventricular tachycardia 06/19/2020   Low back pain 01/02/2020   Lumbar radiculopathy 11/30/2019   Acquired thrombophilia ([place]) 10/08/2019   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   Hyponatremia 04/20/2019   Retroperitoneal hematoma 04/18/2019   Acute on chronic blood loss anemia 85/09/7739   Acute systolic HF (heart failure) ([place]) 04/15/2019   Non-ST elevation (NSTEMI) myocardial infarction ([place]) 04/15/2019   AKI (acute kidney injury) ([place])    Community acquired pneumonia of right lower lobe of lung 04/12/2019   Acute respiratory failure ([place]) 04/12/2019   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   Knee pain 06/07/2018   Allergic conjunctivitis 01/23/2018   Chronic sinusitis 01/23/2018   Personal history of other malignant neoplasm of skin 12/18/2014   Gout 04/06/2012   DIVERTICULITIS-COLON 06/18/2010   FACIAL FLUSHING 12/11/2007   NOCTURIA 12/11/2007   Type 2 diabetes mellitus with hyperlipidemia ([place]) 09/07/2007   OTHER MALAISE AND FATIGUE 09/07/2007   Allergic rhinitis 08/16/2007   HLD (hyperlipidemia) 01/24/2007   OBESITY 01/24/2007   ERECTILE DYSFUNCTION 01/24/2007   HYPERTENSION 01/24/2007   Coronary artery disease involving native coronary artery of native heart with angina pectoris ([place]) 01/24/2007   GERD 01/24/2007   DIVERTICULOSIS, COLON 01/24/2007   IRRITABLE BOWEL SYNDROME 01/24/2007   COLONIC POLYPS, HX OF 01/24/2007   CHOLECYSTECTOMY, HX OF 01/24/2007    Conditions to be addressed/monitored:CHF and DMII  Care Plan : RN Care Manager plan of care  Updates made by Kassie Mends, RN since 10/04/2021 12:00 AM      Problem: No plan of care established for managment of chronic disease states (CHF, DM2, CAD, HTN)   Priority: High     Long-Range Goal: Development of plan of care for chronic disease management (CHF, DM2, CAD, HTN)   Start Date: 07/09/2021  Expected End Date: 01/05/2022  Priority: High  Note:    Current Barriers:  Knowledge Deficits related to plan of care for management of CHF and DMII - patient needs continued reinforcement of management of CHF, DM2 with emphasis on HF action plan, diabetes management and diet. Does not adhere to provider recommendations re:  does not always eat low sodium diet, eats fast food several times per week Pt reports he has scales and weighs daily with weight ranging the same at 217-220,  gets outside to exercise (likes to walk and fish), reports CBG ranges 120-125, has diabetic shoes, checks CBG 4 x per week. Lives with spouse and reports independent in all aspects of his care RNCM Clinical Goal(s):  Patient will verbalize understanding of plan for management of CHF and HTN as evidenced by  review EHR, collaboration with care team and pt report attend all scheduled medical appointments: 11/08/21 colonoscopy as evidenced by review EHR and pt report        through collaboration with RN Care manager, provider, and care team.   Interventions: 1:1 collaboration with primary care provider regarding development and update of comprehensive plan of care as evidenced by provider attestation and co-signature Inter-disciplinary care team collaboration (see longitudinal plan of care) Evaluation of current treatment plan related to  self management and patient's adherence to plan as established by provider Diabetes Interventions: Assessed patient's understanding of A1c goal: <7% Reviewed medications with patient and discussed importance of medication adherence; Counseled on importance of regular laboratory monitoring as prescribed; Discussed plans with patient for ongoing  care management follow up and provided patient with direct contact information for care management team; Reviewed scheduled/upcoming provider appointments including: 07/13/21 casting/ scan (due to issue with right ankle); Review of patient status, including review of consultants reports, relevant laboratory and other test results, and medications completed; Pain assessment completed Lab Results  Component Value Date   HGBA1C 7.0 (H) 01/15/2021  Heart Failure Interventions: Discussed importance of daily weight and advised patient to weigh and record daily; Discussed the importance of keeping all appointments with provider; Provided patient with education about the role of exercise in the management of heart failure;  Reviewed low sodium diet and food choices, importance of reading labels Reviewed heart failure action plan Reviewed importance of exercise Patient Goals/Self-Care Activities: Take medications as prescribed   Attend all scheduled provider appointments Perform all self care activities independently  Perform IADL's (shopping, preparing meals, housekeeping, managing finances) independently Call provider office for new concerns or questions  call office if I gain more than 2 pounds in one day or 5 pounds in one week do ankle pumps when sitting track weight in diary watch for swelling in feet, ankles and legs every day weigh myself daily follow rescue plan if symptoms flare-up eat more whole grains, fruits and vegetables, lean meats and healthy fats dress right for the weather, hot or cold check blood sugar at prescribed times: per instructions by primary care provider  check feet daily for cuts, sores or redness enter blood sugar readings and medication or insulin into daily log take the blood sugar log to all doctor visits take the blood sugar meter to all doctor visits trim toenails straight across fill half of plate with vegetables wash and dry feet carefully every  day wear comfortable, cotton socks Eat adequate fiber in diet Follow low sodium diet Continue doing some type of exercise daily- walking is good       Plan:Telephone follow up appointment with care management team member scheduled for:  12/20/21  Jacqlyn Larsen [facility], BSN RN Case Manager [facility] [phone]

## 2021-10-11 ENCOUNTER — Encounter: Payer: Self-pay | Admitting: Cardiology

## 2021-10-11 ENCOUNTER — Ambulatory Visit: Payer: Medicare HMO | Admitting: Cardiology

## 2021-10-11 ENCOUNTER — Other Ambulatory Visit: Payer: Self-pay

## 2021-10-11 VITALS — BP 128/70 | HR 80 | Ht 71.0 in | Wt 227.4 lb

## 2021-10-11 DIAGNOSIS — I10 Essential (primary) hypertension: Secondary | ICD-10-CM

## 2021-10-11 DIAGNOSIS — I472 Ventricular tachycardia, unspecified: Secondary | ICD-10-CM

## 2021-10-11 DIAGNOSIS — I5022 Chronic systolic (congestive) heart failure: Secondary | ICD-10-CM | POA: Diagnosis not present

## 2021-10-11 DIAGNOSIS — I2510 Atherosclerotic heart disease of native coronary artery without angina pectoris: Secondary | ICD-10-CM

## 2021-10-11 DIAGNOSIS — E785 Hyperlipidemia, unspecified: Secondary | ICD-10-CM | POA: Diagnosis not present

## 2021-10-11 NOTE — Patient Instructions (Addendum)
Medication Instructions:  Your physician recommends that you continue on your current medications as directed. Please refer to the Current Medication list given to you today.  *If you need a refill on your cardiac medications before your next appointment, please call your pharmacy*   Lab Work: None ordered  If you have labs (blood work) drawn today and your tests are completely normal, you will receive your results only by: [place] (if you have MyChart) OR A paper copy in the mail If you have any lab test that is abnormal or we need to change your treatment, we will call you to review the results.   Testing/Procedures: None ordered   Follow-Up: At [facility], you and your health needs are our priority.  As part of our continuing mission to provide you with exceptional heart care, we have created designated Provider Care Teams.  These Care Teams include your primary Cardiologist (physician) and Advanced Practice Providers (APPs -  Physician Assistants and Nurse Practitioners) who all work together to provide you with the care you need, when you need it.  We recommend signing up for the patient portal called "MyChart".  Sign up information is provided on this After Visit Summary.  MyChart is used to connect with patients for Virtual Visits (Telemedicine).  Patients are able to view lab/test results, encounter notes, upcoming appointments, etc.  Non-urgent messages can be sent to your provider as well.   To learn more about what you can do with MyChart, go to [email or web address].    Your next appointment:   6 month(s)  The format for your next appointment:   In Person  Provider:   Freada Bergeron, MD     Other Instructions

## 2021-10-15 ENCOUNTER — Other Ambulatory Visit: Payer: Self-pay | Admitting: Family Medicine

## 2021-10-15 DIAGNOSIS — M1711 Unilateral primary osteoarthritis, right knee: Secondary | ICD-10-CM | POA: Diagnosis not present

## 2021-10-15 DIAGNOSIS — S76311A Strain of muscle, fascia and tendon of the posterior muscle group at thigh level, right thigh, initial encounter: Secondary | ICD-10-CM | POA: Insufficient documentation

## 2021-10-15 DIAGNOSIS — M545 Low back pain, unspecified: Secondary | ICD-10-CM | POA: Diagnosis not present

## 2021-10-15 DIAGNOSIS — S76319A Strain of muscle, fascia and tendon of the posterior muscle group at thigh level, unspecified thigh, initial encounter: Secondary | ICD-10-CM | POA: Insufficient documentation

## 2021-10-26 DIAGNOSIS — I5022 Chronic systolic (congestive) heart failure: Secondary | ICD-10-CM

## 2021-10-26 DIAGNOSIS — E1169 Type 2 diabetes mellitus with other specified complication: Secondary | ICD-10-CM

## 2021-10-28 DIAGNOSIS — E119 Type 2 diabetes mellitus without complications: Secondary | ICD-10-CM | POA: Diagnosis not present

## 2021-11-04 ENCOUNTER — Encounter: Payer: Self-pay | Admitting: Internal Medicine

## 2021-11-08 ENCOUNTER — Encounter: Payer: Self-pay | Admitting: Internal Medicine

## 2021-11-08 ENCOUNTER — Ambulatory Visit (AMBULATORY_SURGERY_CENTER): Payer: Medicare HMO | Admitting: Internal Medicine

## 2021-11-08 ENCOUNTER — Other Ambulatory Visit: Payer: Self-pay

## 2021-11-08 ENCOUNTER — Other Ambulatory Visit: Payer: Self-pay | Admitting: Internal Medicine

## 2021-11-08 VITALS — BP 112/62 | HR 79 | Temp 96.9°F | Resp 8 | Ht 71.0 in | Wt 226.0 lb

## 2021-11-08 DIAGNOSIS — D124 Benign neoplasm of descending colon: Secondary | ICD-10-CM | POA: Diagnosis not present

## 2021-11-08 DIAGNOSIS — D122 Benign neoplasm of ascending colon: Secondary | ICD-10-CM

## 2021-11-08 DIAGNOSIS — I10 Essential (primary) hypertension: Secondary | ICD-10-CM | POA: Diagnosis not present

## 2021-11-08 DIAGNOSIS — I2510 Atherosclerotic heart disease of native coronary artery without angina pectoris: Secondary | ICD-10-CM | POA: Diagnosis not present

## 2021-11-08 DIAGNOSIS — D123 Benign neoplasm of transverse colon: Secondary | ICD-10-CM | POA: Diagnosis not present

## 2021-11-08 DIAGNOSIS — Z860100 Personal history of colon polyps, unspecified: Secondary | ICD-10-CM

## 2021-11-08 DIAGNOSIS — E119 Type 2 diabetes mellitus without complications: Secondary | ICD-10-CM | POA: Diagnosis not present

## 2021-11-08 MED ORDER — SODIUM CHLORIDE 0.9 % IV SOLN: 500.0000 mL | Freq: Once | INTRAVENOUS | Status: DC

## 2021-11-08 NOTE — Op Note (Signed)
[facility] ?[name] ?Procedure Date: 11/08/2021 9:48 AM ?MRN: [id number] ?Endoscopist: Docia Chuck. Henrene Pastor , MD ?Age: 76 ?Referring MD:  ?Date of Birth: [birth year] ?Gender: Male ?Account #: [id number] ?Procedure:                Colonoscopy with cold snare polypectomy x 5; biopsy  ?                          x 1 ?Indications:              High risk colon cancer surveillance: Personal  ?                          history of multiple (3 or more) adenomas ?Medicines:                Monitored Anesthesia Care ?Procedure:                Pre-Anesthesia Assessment: ?                          - Prior to the procedure, a History and Physical  ?                          was performed, and patient medications and  ?                          allergies were reviewed. The patient's tolerance of  ?                          previous anesthesia was also reviewed. The risks  ?                          and benefits of the procedure and the sedation  ?                          options and risks were discussed with the patient.  ?                          All questions were answered, and informed consent  ?                          was obtained. Prior Anticoagulants: The patient has  ?                          taken no previous anticoagulant or antiplatelet  ?                          agents. ASA Grade Assessment: III - A patient with  ?                          severe systemic disease. After reviewing the risks  ?                          and benefits, the patient was deemed in  ?  satisfactory condition to undergo the procedure. ?                          After obtaining informed consent, the colonoscope  ?                          was passed under direct vision. Throughout the  ?                          procedure, the patient's blood pressure, pulse, and  ?                          oxygen saturations were monitored continuously. The  ?                          CF [id number] #[id number] was  introduced through the anus  ?                          and advanced to the the cecum, identified by  ?                          appendiceal orifice and ileocecal valve. The  ?                          ileocecal valve, appendiceal orifice, and rectum  ?                          were photographed. The quality of the bowel  ?                          preparation was excellent. The colonoscopy was  ?                          performed without difficulty. The patient tolerated  ?                          the procedure well. The bowel preparation used was  ?                          SUPREP via split dose instruction. ?Scope In: 10:04:40 AM ?Scope Out: 10:20:54 AM ?Scope Withdrawal Time: 0 hours 14 minutes 11 seconds  ?Total Procedure Duration: 0 hours 16 minutes 14 seconds  ?Findings:                 Five polyps were found in the descending colon,  ?                          transverse colon and ascending colon. The polyps  ?                          were 2 to 4 mm in size. These polyps were removed  ?                          with a cold snare. Resection  and retrieval were  ?                          complete. ?                          A less than 1 mm polyp was found in the ascending  ?                          colon. The polyp was removed with a jumbo cold  ?                          forceps. Resection and retrieval were complete. ?                          Multiple diverticula were found in the entire  ?                          colon. An unremarkable surgical anastomosis was  ?                          located at 25 cm from the anal verge. ?                          The exam was otherwise without abnormality on  ?                          direct and retroflexion views. ?Complications:            No immediate complications. Estimated blood loss:  ?                          None. ?Estimated Blood Loss:     Estimated blood loss: none. ?Impression:               - Five 2 to 4 mm polyps in the descending colon, in  ?                           the transverse colon and in the ascending colon,  ?                          removed with a cold snare. Resected and retrieved. ?                          - One less than 1 mm polyp in the ascending colon,  ?                          removed with a jumbo cold forceps. Resected and  ?                          retrieved. ?                          - Diverticulosis in the entire examined colon.  ?  Status post sigmoid colectomy. ?                          - The examination was otherwise normal on direct  ?                          and retroflexion views. ?Recommendation:           - Repeat colonoscopy in 3 - 5 years for  ?                          surveillance. ?                          - Patient has a contact number available for  ?                          emergencies. The signs and symptoms of potential  ?                          delayed complications were discussed with the  ?                          patient. Return to normal activities tomorrow.  ?                          Written discharge instructions were provided to the  ?                          patient. ?                          - Resume previous diet. ?                          - Continue present medications. ?                          - Await pathology results. ?Docia Chuck. Henrene Pastor, MD ?11/08/2021 10:30:28 AM ?This report has been signed electronically. ?

## 2021-11-08 NOTE — Patient Instructions (Signed)
Information on polyps and diverticulosis given to you today.  Await pathology results.  Resume previous diet and medications.  YOU HAD AN ENDOSCOPIC PROCEDURE TODAY AT THE [facility]:   Refer to the procedure report that was given to you for any specific questions about what was found during the examination.  If the procedure report does not answer your questions, please call your gastroenterologist to clarify.  If you requested that your care partner not be given the details of your procedure findings, then the procedure report has been included in a sealed envelope for you to review at your convenience later.  YOU SHOULD EXPECT: Some feelings of bloating in the abdomen. Passage of more gas than usual.  Walking can help get rid of the air that was put into your GI tract during the procedure and reduce the bloating. If you had a lower endoscopy (such as a colonoscopy or flexible sigmoidoscopy) you may notice spotting of blood in your stool or on the toilet paper. If you underwent a bowel prep for your procedure, you may not have a normal bowel movement for a few days.  Please Note:  You might notice some irritation and congestion in your nose or some drainage.  This is from the oxygen used during your procedure.  There is no need for concern and it should clear up in a day or so.  SYMPTOMS TO REPORT IMMEDIATELY:   Following lower endoscopy (colonoscopy or flexible sigmoidoscopy):  Excessive amounts of blood in the stool  Significant tenderness or worsening of abdominal pains  Swelling of the abdomen that is new, acute  Fever of 100F or higher   For urgent or emergent issues, a gastroenterologist can be reached at any hour by calling [phone]. Do not use MyChart messaging for urgent concerns.    DIET:  We do recommend a small meal at first, but then you may proceed to your regular diet.  Drink plenty of fluids but you should avoid alcoholic beverages for 24  hours.  ACTIVITY:  You should plan to take it easy for the rest of today and you should NOT DRIVE or use heavy machinery until tomorrow (because of the sedation medicines used during the test).    FOLLOW UP: Our staff will call the number listed on your records 48-72 hours following your procedure to check on you and address any questions or concerns that you may have regarding the information given to you following your procedure. If we do not reach you, we will leave a message.  We will attempt to reach you two times.  During this call, we will ask if you have developed any symptoms of COVID 19. If you develop any symptoms (ie: fever, flu-like symptoms, shortness of breath, cough etc.) before then, please call [phone].  If you test positive for Covid 19 in the 2 weeks post procedure, please call and report this information to us.    If any biopsies were taken you will be contacted by phone or by letter within the next 1-3 weeks.  Please call us at [phone] if you have not heard about the biopsies in 3 weeks.    SIGNATURES/CONFIDENTIALITY: You and/or your care partner have signed paperwork which will be entered into your electronic medical record.  These signatures attest to the fact that that the information above on your After Visit Summary has been reviewed and is understood.  Full responsibility of the confidentiality of this discharge information lies with you and/or   your care-partner. 

## 2021-11-08 NOTE — Progress Notes (Signed)
HISTORY OF PRESENT ILLNESS: ? ?Cameron Daniel is a 76 y.o. male with past medical history as listed below who presents today for surveillance colonoscopy.  I saw him in the office September 21, 2021.  See that dictation.  No interval changes.  He tolerated his prep well ? ?REVIEW OF SYSTEMS: ? ?All non-GI ROS negative.   ?Past Medical History:  ?Diagnosis Date  ? Allergic rhinitis, cause unspecified   ? Allergy   ? Anxiety   ? Arthritis   ? Cancer [facility])   ? a couple places removed from eyelid  ? Carotid Doppler 04/2020  ? Carotid US 9/21: No evidence of ICA stenosis bilaterally; right vertebral artery stenosis  ? Cataract   ? Chronic combined systolic (congestive) and diastolic (congestive) heart failure (HCC)   ? Coronary atherosclerosis of unspecified type of vessel, native or graft   ? a. MI 1985 with unclear details. b. CABG 03/2019 with LIMA -> LAD, SVG -> OM, SVG to dRCA.  ? Depression   ? PTSD   ? Diverticulosis of colon (without mention of hemorrhage)   ? Esophageal reflux   ? Esophageal stricture   ? Essential hypertension   ? Former tobacco use   ? Hemorrhoids   ? Hyperlipidemia   ? ICD (implantable cardioverter-defibrillator) in place   ? Irritable bowel syndrome   ? Ischemic cardiomyopathy   ? Mild carotid artery disease ([place])   ? a. 1-39% bilaterally 03/2019 duplex.  ? Myocardial infarction [facility])   ? Nocturia   ? Obesity, unspecified   ? Orthostasis   ? Other acquired absence of organ   ? Personal history of colonic polyps   ? Psychosexual dysfunction with inhibited sexual excitement   ? Retroperitoneal bleed   ? Type II or unspecified type diabetes mellitus without mention of complication, not stated as uncontrolled   ? VT (ventricular tachycardia)   ? ? ?Past Surgical History:  ?Procedure Laterality Date  ? ADENOIDECTOMY    ? CARPAL TUNNEL RELEASE    ? left   ? CHOLECYSTECTOMY    ? COLON RESECTION    ? 18 inches  ? COLON SURGERY  2013  ? CORONARY ANGIOPLASTY    ? 1985   ? CORONARY ARTERY BYPASS GRAFT  N/A 04/26/2019  ? Procedure: CORONARY ARTERY BYPASS GRAFTING (CABG) x Three, using left internal mammary artery and right leg greater saphenous vein harvested endoscopically;  Surgeon: Gaye Pollack, MD;  Location: [place] OR;  Service: Open Heart Surgery;  Laterality: N/A;  ? ELBOW BURSA SURGERY    ? EYE SURGERY    ? ICD IMPLANT N/A 06/25/2020  ? Procedure: ICD IMPLANT;  Surgeon: Vickie Epley, MD;  Location: [place] [facility];  Service: Cardiovascular;  Laterality: N/A;  ? LEFT HEART CATH AND CORS/GRAFTS ANGIOGRAPHY N/A 06/19/2020  ? Procedure: LEFT HEART CATH AND CORS/GRAFTS ANGIOGRAPHY;  Surgeon: Troy Sine, MD;  Location: [place] [facility];  Service: Cardiovascular;  Laterality: N/A;  ? POLYPECTOMY    ? RIGHT/LEFT HEART CATH AND CORONARY ANGIOGRAPHY N/A 04/16/2019  ? Procedure: RIGHT/LEFT HEART CATH AND CORONARY ANGIOGRAPHY;  Surgeon: Belva Crome, MD;  Location: [place] [facility];  Service: Cardiovascular;  Laterality: N/A;  ? SHOULDER SURGERY    ? Rt. Shoulder  ? TEE WITHOUT CARDIOVERSION N/A 04/26/2019  ? Procedure: TRANSESOPHAGEAL ECHOCARDIOGRAM (TEE);  Surgeon: Gaye Pollack, MD;  Location: [facility] [facility];  Service: Open Heart Surgery;  Laterality: N/A;  ? TONSILLECTOMY    ?  V TACH ABLATION N/A 06/22/2020  ? Procedure: V TACH ABLATION;  Surgeon: Vickie Epley, MD;  Location: [place] [facility];  Service: Cardiovascular;  Laterality: N/A;  ? ? ?Social History ?[name]  reports that he quit smoking about 38 years ago. His smoking use included cigarettes. He has quit using smokeless tobacco. He reports current alcohol use. He reports that he does not use drugs. ? ?family history includes Allergic rhinitis in his father; Coronary artery disease in his mother; Lung cancer in his father. ? ?Allergies  ?Allergen Reactions  ? Doxazosin Mesylate Other (See Comments)  ?  Drops in blood pressure   ? Losartan   ?  Other reaction(s): Muscle pain  ? Testosterone   ?  Other reaction(s): Erythrocytosis   ? Flomax [Tamsulosin Hcl] Itching and Other (See Comments)  ?  Drops in blood pressure  ?  ? Penicillins Rash and Other (See Comments)  ?  Has patient had a PCN reaction causing immediate rash, facial/tongue/throat swelling, SOB or lightheadedness with hypotension: No ?Has patient had a PCN reaction causing severe rash involving mucus membranes or skin necrosis: No ?Has patient had a PCN reaction that required hospitalization No ?Has patient had a PCN reaction occurring within the last 10 years: No ?If all of the above answers are "NO", then may proceed with Cephalosporin use. ?  ? Sulfonamide Derivatives Rash  ? Trulicity [Dulaglutide] Nausea And Vomiting  ?  N&V, Dizziness  ? ? ?  ? ?PHYSICAL EXAMINATION: ? ?Vital signs: BP (!) 109/59   Pulse 64   Temp (!) 96.9 ?F (36.1 ?C) (Temporal)   Ht '5\' 11"'$  (1.803 m)   Wt 226 lb (102.5 kg)   SpO2 96%   BMI 31.52 kg/m?  ?General: Well-developed, well-nourished, no acute distress ?HEENT: Sclerae are anicteric, conjunctiva pink. Oral mucosa intact ?Lungs: Clear ?Heart: Regular ?Abdomen: soft, nontender, nondistended, no obvious ascites, no peritoneal signs, normal bowel sounds. No organomegaly. ?Extremities: No edema ?Psychiatric: alert and oriented x3. Cooperative  ? ? ? ?ASSESSMENT: ? ?History of adenomatous colon polyps.  Due for surveillance ? ?PLAN: ? ? ? ?Surveillance colonoscopy ? ? ?  ?

## 2021-11-08 NOTE — Progress Notes (Signed)
VS-DT 

## 2021-11-08 NOTE — Progress Notes (Signed)
Report to PACU, RN, vss, BBS= Clear.  

## 2021-11-08 NOTE — Progress Notes (Signed)
Called to room to assist during endoscopic procedure.  Patient ID and intended procedure confirmed with present staff. Received instructions for my participation in the procedure from the performing physician.  

## 2021-11-10 ENCOUNTER — Telehealth: Payer: Self-pay | Admitting: *Deleted

## 2021-11-10 NOTE — Telephone Encounter (Signed)
?  Follow up Call- ? ?Call back number 11/08/2021  ?Post procedure Call Back phone  # [phone]  ?Permission to leave phone message Yes  ?Some recent data might be hidden  ?  ? ?Patient questions: ? ?Do you have a fever, pain , or abdominal swelling? No. ?Pain Score  0 * ? ?Have you tolerated food without any problems? Yes.   ? ?Have you been able to return to your normal activities? Yes.   ? ?Do you have any questions about your discharge instructions: ?Diet   No. ?Medications  No. ?Follow up visit  No. ? ?Do you have questions or concerns about your Care? No. ? ?Actions: ?* If pain score is 4 or above: ?No action needed, pain <4. ? ? ?

## 2021-11-11 ENCOUNTER — Encounter: Payer: Self-pay | Admitting: Internal Medicine

## 2021-12-20 ENCOUNTER — Telehealth: Payer: Medicare HMO

## 2021-12-21 ENCOUNTER — Telehealth: Payer: Self-pay | Admitting: Pharmacist

## 2021-12-21 NOTE — Progress Notes (Signed)
? ? ?Chronic Care Management ?Pharmacy Assistant  ? ?Name: Cameron Daniel  MRN: [id number] DOB: 1945/10/18 ? ? ?Reason for Encounter: Disease State - General Adherence Call  ?  ? ?Recent office visits:  ?07/30/21 Cameron Luo MD - Family Medicine - Diabetes - Labs were ordered. diphenoxylate-atropine (LOMOTIL) 2.5-0.025 MG tablet prescribed. predniSONE (DELTASONE) 20 MG tablet prescribed. Follow up as scheduled.  ? ?07/13/21 Cameron Luo MD - Diabetes - No medication changes noted. Follow up as scheduled.  ? ?Recent consult visits:  ?10/15/21 Cameron Daniel - No notes available ? ?10/11/21 Cameron Kaufman MD - Ventricular Tach - No medication changes. Follow up as scheduled.  ? ?09/20/21 Cameron Gails MD - Meibomian gland dysfunction - No notes available. ? ?09/16/21 Cameron Shorts MD - GI - Referral to GI for colonoscopy. Prep instructions given and colonoscopy scheduled. ? ?07/13/21 Cameron Daniel DPM - Podiatry - Size 11Wide Apex V753 shoes will be ordered along with 3 pr custom diabetic insert. Follow up as scheduled.  ? ? ?Hospital visits:  ?None in previous 6 months ? ?Medications: ?Outpatient Encounter Medications as of 12/21/2021  ?Medication Sig  ? acetaminophen (TYLENOL) 500 MG tablet Take 1,000 mg by mouth at bedtime as needed for mild pain.  ? aspirin EC 81 MG tablet Take 1 tablet (81 mg total) by mouth daily.  ? azelastine (ASTELIN) 0.1 % nasal spray Place 2 sprays into both nostrils 2 (two) times daily. Use in each nostril as directed  ? carvedilol (COREG) 6.25 MG tablet Take 1 tablet (6.25 mg total) by mouth 2 (two) times daily with a meal.  ? citalopram (CELEXA) 10 MG tablet TAKE 1 TABLET BY MOUTH EVERY DAY  ? colestipol (COLESTID) 5 g packet Take 5 g by mouth daily.   ? diphenoxylate-atropine (LOMOTIL) 2.5-0.025 MG tablet Take 2 tablets by mouth 4 (four) times daily as needed for diarrhea or loose stools.  ? fexofenadine (ALLEGRA) 180 MG tablet Take 1 tablet (180 mg total) by mouth daily as needed. For  allergies  ? finasteride (PROSCAR) 5 MG tablet TAKE 1 TABLET BY MOUTH EVERY DAY  ? JANUVIA 50 MG tablet TAKE 1 TABLET BY MOUTH EVERY DAY  ? JARDIANCE 25 MG TABS tablet TAKE 25 MG (1 TABLET) BY MOUTH DAILY BEFORE BREAKFAST. STOP PIOGLITAZONE  ? losartan (COZAAR) 25 MG tablet TAKE 1 TABLET BY MOUTH EVERY DAY  ? loteprednol (LOTEMAX) 0.5 % ophthalmic suspension Place 1 drop into both eyes 4 (four) times daily as needed (itching).  ? magnesium oxide (MAG-OX) 400 MG tablet Take 1 tablet (400 mg total) by mouth 2 (two) times daily.  ? montelukast (SINGULAIR) 10 MG tablet TAKE 1 TABLET BY MOUTH EVERYDAY AT BEDTIME  ? OVER THE COUNTER MEDICATION Balance of Petra Kuba  ? pantoprazole (PROTONIX) 40 MG tablet TAKE 1 TABLET BY MOUTH EVERY DAY IN THE MORNING  ? rosuvastatin (CRESTOR) 40 MG tablet TAKE 1 TABLET (40 MG TOTAL) BY MOUTH DAILY AT 6 PM.  ? Saccharomyces boulardii (PROBIOTIC) 250 MG CAPS Take 1 capsule by mouth daily.  ? vitamin B-12 (CYANOCOBALAMIN) 100 MCG tablet Take 100 mcg by mouth daily.  ? ?No facility-administered encounter medications on file as of 12/21/2021.  ? ? ?Have you had any problems recently with your health? ?Patient's wife denied any problems with patients health.  ? ?Have you had any problems with your pharmacy? ?Patient's wife denied any problems with his pharmacy.  ? ?What issues or side effects are you having with your medications? ?Patient's wife  denied any issues or side effects with patient's current medications.  ? ?What would you like me to pass along to [name], CPP for them to help you with?  ?Patient's wife did not have anything to pass along to CPP at this time but would have patient call if he had any concerns.  ? ?What can we do to take care of you better? ?Patient's wife did not have any suggestions and is currently satisfied with his current level of care.  ? ?Care Gaps ? ?AWV: done 09/02/21 ?Colonoscopy: done 11/08/21 ?DM Eye Exam: due 09/11/21 ?DM Foot Exam: due 05/05/21 ?Microalbumin:  done 07/30/21 ?HbgAIC: done 07/30/21 (7.0) ?DEXA: N/A ?Mammogram: N/A ? ? ?Future Appointments  ?Date Time Provider [place]  ?12/23/2021  7:20 AM [facility] DEVICE REMOTES [facility]  ?12/27/2021 12:30 PM [facility] CARE MGR [facility]  ?03/24/2022  7:20 AM [facility] DEVICE REMOTES [facility]  ?06/23/2022  7:20 AM [facility] DEVICE REMOTES [facility]  ?09/08/2022 10:30 AM [facility]-NURSE HEALTH ADVISOR [facility]  ?09/22/2022  7:20 AM [facility] DEVICE REMOTES [facility]  ?12/22/2022  7:20 AM [facility] DEVICE REMOTES [facility]  ?03/23/2023  7:20 AM [facility] DEVICE REMOTES [facility]  ? ? ? ?Liza Showfety, CCMA ?Clinical Pharmacist Assistant  ?[phone] ? ?

## 2021-12-23 ENCOUNTER — Ambulatory Visit (INDEPENDENT_AMBULATORY_CARE_PROVIDER_SITE_OTHER): Payer: Medicare HMO

## 2021-12-23 DIAGNOSIS — I472 Ventricular tachycardia, unspecified: Secondary | ICD-10-CM | POA: Diagnosis not present

## 2021-12-23 LAB — CUP PACEART REMOTE DEVICE CHECK
Battery Remaining Longevity: 129 mo
Battery Voltage: 3.01 V
Brady Statistic RV Percent Paced: 0.01 %
Date Time Interrogation Session: 20230427073725
HighPow Impedance: 74 Ohm
Implantable Lead Implant Date: 20211028
Implantable Lead Location: 753860
Implantable Pulse Generator Implant Date: 20211028
Lead Channel Impedance Value: 399 Ohm
Lead Channel Impedance Value: 456 Ohm
Lead Channel Pacing Threshold Amplitude: 0.625 V
Lead Channel Pacing Threshold Pulse Width: 0.4 ms
Lead Channel Sensing Intrinsic Amplitude: 9.875 mV
Lead Channel Sensing Intrinsic Amplitude: 9.875 mV
Lead Channel Setting Pacing Amplitude: 3.25 V
Lead Channel Setting Pacing Pulse Width: 0.4 ms
Lead Channel Setting Sensing Sensitivity: 0.3 mV

## 2021-12-27 ENCOUNTER — Telehealth: Payer: Self-pay | Admitting: *Deleted

## 2021-12-27 ENCOUNTER — Telehealth: Payer: Medicare HMO

## 2021-12-27 NOTE — Telephone Encounter (Signed)
?  Care Management  ? ?Follow Up Note ? ? ?12/27/2021 ?Name: Cameron Daniel MRN: [id number] DOB: October 10, 1945 ? ? ?Referred by: [name], MD ?Reason for referral : Chronic Care Management (CHF, DM2) ? ? ?An unsuccessful telephone outreach was attempted today. The patient was referred to the case management team for assistance with care management and care coordination.  ? ?Follow Up Plan: Telephone follow up appointment with care management team member scheduled for: per care guide rescheduling. ? ?Jacqlyn Larsen RNC, BSN ?RN Case Manager ?[place] ?[phone] ? ? ?

## 2022-01-03 ENCOUNTER — Telehealth: Payer: Self-pay | Admitting: *Deleted

## 2022-01-03 NOTE — Chronic Care Management (AMB) (Signed)
?  Chronic Care Management ?Note ? ?01/03/2022 ?Name: Cameron Daniel MRN: [id number] DOB: May 09, 1946 ? ?Cameron Daniel is a 76 y.o. year old male who is a primary care patient of [name], MD and is actively engaged with the care management team. I reached out to Thornton Papas by phone today to assist with re-scheduling a follow up visit with the RN Case Manager ? ?Follow up plan: ?Unsuccessful telephone outreach attempt made. A HIPAA compliant phone message was left for the patient providing contact information and requesting a return call.  ?The care management team will reach out to the patient again over the next 7 days.  ?If patient returns call to provider office, please advise to call [facility] at [phone]. ? ?Laverda Sorenson  ?Care Guide, Embedded Care Coordination ?[place]  Care Management  ?Direct Dial: [phone] ? ?

## 2022-01-06 NOTE — Progress Notes (Signed)
Remote ICD transmission.   

## 2022-01-10 NOTE — Chronic Care Management (AMB) (Signed)
?  Chronic Care Management ?Note ? ?01/10/2022 ?Name: CARTEL MAUSS MRN: [id number] DOB: 01/23/1946 ? ?[name] is a 76 y.o. year old male who is a primary care patient of [name], MD and is actively engaged with the care management team. I reached out to Thornton Papas by phone today to assist with re-scheduling a follow up visit with the RN Case Manager ? ?Follow up plan: ?Patient declines further follow up and engagement by the care management team. Appropriate care team members and provider have been notified via electronic communication. The care management team is available to follow up with the patient after provider conversation with the patient regarding recommendation for care management engagement and subsequent re-referral to the care management team.  ? ?Laverda Sorenson  ?Care Guide, Embedded Care Coordination ?  Care Management  ?Direct Dial: [phone] ? ?

## 2022-01-14 ENCOUNTER — Ambulatory Visit: Payer: Self-pay | Admitting: *Deleted

## 2022-01-14 DIAGNOSIS — E1169 Type 2 diabetes mellitus with other specified complication: Secondary | ICD-10-CM

## 2022-01-14 DIAGNOSIS — I5022 Chronic systolic (congestive) heart failure: Secondary | ICD-10-CM

## 2022-01-14 NOTE — Chronic Care Management (AMB) (Signed)
   01/14/2022  Cameron Daniel 08/17/1946 [id number]   Per message from care guide on 01/10/22, pt declines to reschedule for any further appointments, care plan updated and resolved.  Case closed.  Jacqlyn Larsen RNC, BSN RN Case Manager [facility] [phone]

## 2022-01-17 ENCOUNTER — Telehealth: Payer: Self-pay

## 2022-01-17 NOTE — Telephone Encounter (Signed)
LVM informing patient that his device is MRI and CT compatible

## 2022-01-17 NOTE — Telephone Encounter (Signed)
Pt LMOVM because he needs to know if he can have a CT and a MRI. His phone number is [phone].

## 2022-01-18 DIAGNOSIS — E119 Type 2 diabetes mellitus without complications: Secondary | ICD-10-CM | POA: Diagnosis not present

## 2022-01-25 ENCOUNTER — Telehealth: Payer: Self-pay

## 2022-01-25 NOTE — Telephone Encounter (Signed)
Pt came into office wanting pcp to take a look at his ct scan results and provide his opinion. Pt filled out an intake form. Please advise.  Cb#: [phone]/ [phone]

## 2022-01-25 NOTE — Telephone Encounter (Signed)
Pt said that he when to [place]  to have a ct scan done and wanted  discuss the result w/ him the he  drop out the results   for you up front. He would like a call back to  after you have reviewed this. Please advise

## 2022-01-26 NOTE — Telephone Encounter (Signed)
Patient called again about the results. I explained to patient that Dr. Dennard Schaumann was out of the office today and that I would make sure he was aware of the results.   I have placed the results on Dr. Samella Parr desk on top of the green folder.  Please advise,  CB# [phone]

## 2022-02-08 ENCOUNTER — Ambulatory Visit (INDEPENDENT_AMBULATORY_CARE_PROVIDER_SITE_OTHER): Payer: Medicare HMO | Admitting: Family Medicine

## 2022-02-08 ENCOUNTER — Encounter: Payer: Self-pay | Admitting: Family Medicine

## 2022-02-08 VITALS — BP 122/80 | HR 78 | Temp 97.5°F | Ht 71.0 in | Wt 225.0 lb

## 2022-02-08 DIAGNOSIS — J3089 Other allergic rhinitis: Secondary | ICD-10-CM

## 2022-02-08 MED ORDER — PREDNISONE 20 MG PO TABS: ORAL_TABLET | ORAL | 0 refills | Status: DC

## 2022-02-08 MED ORDER — METHYLPREDNISOLONE ACETATE 80 MG/ML IJ SUSP
80.0000 mg | Freq: Once | INTRAMUSCULAR | Status: AC
  Administered 2022-02-08: 60 mg via INTRAMUSCULAR

## 2022-02-08 NOTE — Progress Notes (Signed)
Subjective:    Patient ID: Cameron Daniel, male    DOB: 10-13-1945, 76 y.o.   MRN: [id number]  HPI Patient reports severe allergies.  He reports itchy watery eyes.  He reports rhinorrhea and head congestion.  He reports postnasal drip and sneezing.  He reports cough.  He reports sore throat and drainage.  He denies any fevers or chills or sinus pain.  Symptoms of been going on now for several weeks despite taking Allegra, Flonase, Singulair, and Patanol.  He is requesting a cortisone injection. Past Medical History:  Diagnosis Date   Allergic rhinitis, cause unspecified    Allergy    Anxiety    Arthritis    Cancer ([place])    a couple places removed from eyelid   Carotid Doppler 04/2020   Carotid US 9/21: No evidence of ICA stenosis bilaterally; right vertebral artery stenosis   Cataract    Chronic combined systolic (congestive) and diastolic (congestive) heart failure (HCC)    Coronary atherosclerosis of unspecified type of vessel, native or graft    a. MI 1985 with unclear details. b. CABG 03/2019 with LIMA -> LAD, SVG -> OM, SVG to dRCA.   Depression    PTSD    Diverticulosis of colon (without mention of hemorrhage)    Esophageal reflux    Esophageal stricture    Essential hypertension    Former tobacco use    Hemorrhoids    Hyperlipidemia    ICD (implantable cardioverter-defibrillator) in place    Irritable bowel syndrome    Ischemic cardiomyopathy    Mild carotid artery disease ([place])    a. 1-39% bilaterally 03/2019 duplex.   Myocardial infarction (HCC)    Nocturia    Obesity, unspecified    Orthostasis    Other acquired absence of organ    Personal history of colonic polyps    Psychosexual dysfunction with inhibited sexual excitement    Retroperitoneal bleed    Type II or unspecified type diabetes mellitus without mention of complication, not stated as uncontrolled    VT (ventricular tachycardia) ([place])    Past Surgical History:  Procedure Laterality Date    ADENOIDECTOMY     CARPAL TUNNEL RELEASE     left    CHOLECYSTECTOMY     COLON RESECTION     18 inches   COLON SURGERY  2013   CORONARY ANGIOPLASTY     1985    CORONARY ARTERY BYPASS GRAFT N/A 04/26/2019   Procedure: CORONARY ARTERY BYPASS GRAFTING (CABG) x Three, using left internal mammary artery and right leg greater saphenous vein harvested endoscopically;  Surgeon: Gaye Pollack, MD;  Location: [facility];  Service: Open Heart Surgery;  Laterality: N/A;   ELBOW BURSA SURGERY     EYE SURGERY     ICD IMPLANT N/A 06/25/2020   Procedure: ICD IMPLANT;  Surgeon: Vickie Epley, MD;  Location: [facility];  Service: Cardiovascular;  Laterality: N/A;   LEFT HEART CATH AND CORS/GRAFTS ANGIOGRAPHY N/A 06/19/2020   Procedure: LEFT HEART CATH AND CORS/GRAFTS ANGIOGRAPHY;  Surgeon: Troy Sine, MD;  Location: [facility];  Service: Cardiovascular;  Laterality: N/A;   POLYPECTOMY     RIGHT/LEFT HEART CATH AND CORONARY ANGIOGRAPHY N/A 04/16/2019   Procedure: RIGHT/LEFT HEART CATH AND CORONARY ANGIOGRAPHY;  Surgeon: Belva Crome, MD;  Location: [facility];  Service: Cardiovascular;  Laterality: N/A;   SHOULDER SURGERY     Rt. Shoulder   TEE WITHOUT CARDIOVERSION N/A  04/26/2019   Procedure: TRANSESOPHAGEAL ECHOCARDIOGRAM (TEE);  Surgeon: Gaye Pollack, MD;  Location: [facility];  Service: Open Heart Surgery;  Laterality: N/A;   TONSILLECTOMY     V TACH ABLATION N/A 06/22/2020   Procedure: V TACH ABLATION;  Surgeon: Vickie Epley, MD;  Location: [facility];  Service: Cardiovascular;  Laterality: N/A;   Current Outpatient Medications on File Prior to Visit  Medication Sig Dispense Refill   acetaminophen (TYLENOL) 500 MG tablet Take 1,000 mg by mouth at bedtime as needed for mild pain.     aspirin EC 81 MG tablet Take 1 tablet (81 mg total) by mouth daily. 90 tablet 3   azelastine (ASTELIN) 0.1 % nasal spray Place 2 sprays into both nostrils 2 (two) times daily.  Use in each nostril as directed 30 mL 12   carvedilol (COREG) 6.25 MG tablet Take 1 tablet (6.25 mg total) by mouth 2 (two) times daily with a meal. 180 tablet 3   citalopram (CELEXA) 10 MG tablet TAKE 1 TABLET BY MOUTH EVERY DAY 90 tablet 2   colestipol (COLESTID) 5 g packet Take 5 g by mouth daily.      diphenoxylate-atropine (LOMOTIL) 2.5-0.025 MG tablet Take 2 tablets by mouth 4 (four) times daily as needed for diarrhea or loose stools. 30 tablet 0   fexofenadine (ALLEGRA) 180 MG tablet Take 1 tablet (180 mg total) by mouth daily as needed. For allergies 90 tablet 0   finasteride (PROSCAR) 5 MG tablet TAKE 1 TABLET BY MOUTH EVERY DAY 90 tablet 3   JANUVIA 50 MG tablet TAKE 1 TABLET BY MOUTH EVERY DAY 90 tablet 1   JARDIANCE 25 MG TABS tablet TAKE 25 MG (1 TABLET) BY MOUTH DAILY BEFORE BREAKFAST. STOP PIOGLITAZONE 90 tablet 1   losartan (COZAAR) 25 MG tablet TAKE 1 TABLET BY MOUTH EVERY DAY 90 tablet 2   loteprednol (LOTEMAX) 0.5 % ophthalmic suspension Place 1 drop into both eyes 4 (four) times daily as needed (itching). 5 mL 1   magnesium oxide (MAG-OX) 400 MG tablet Take 1 tablet (400 mg total) by mouth 2 (two) times daily. 60 tablet 11   montelukast (SINGULAIR) 10 MG tablet TAKE 1 TABLET BY MOUTH EVERYDAY AT BEDTIME 90 tablet 1   OVER THE COUNTER MEDICATION Balance of Nature     pantoprazole (PROTONIX) 40 MG tablet TAKE 1 TABLET BY MOUTH EVERY DAY IN THE MORNING 90 tablet 3   rosuvastatin (CRESTOR) 40 MG tablet TAKE 1 TABLET (40 MG TOTAL) BY MOUTH DAILY AT 6 PM. 90 tablet 3   Saccharomyces boulardii (PROBIOTIC) 250 MG CAPS Take 1 capsule by mouth daily.     vitamin B-12 (CYANOCOBALAMIN) 100 MCG tablet Take 100 mcg by mouth daily.     No current facility-administered medications on file prior to visit.   Allergies  Allergen Reactions   Doxazosin Mesylate Other (See Comments)    Drops in blood pressure    Losartan     Other reaction(s): Muscle pain   Testosterone     Other  reaction(s): Erythrocytosis   Flomax [Tamsulosin Hcl] Itching and Other (See Comments)    Drops in blood pressure     Penicillins Rash and Other (See Comments)    Has patient had a PCN reaction causing immediate rash, facial/tongue/throat swelling, SOB or lightheadedness with hypotension: No Has patient had a PCN reaction causing severe rash involving mucus membranes or skin necrosis: No Has patient had a PCN reaction that required hospitalization No  Has patient had a PCN reaction occurring within the last 10 years: No If all of the above answers are "NO", then may proceed with Cephalosporin use.    Sulfonamide Derivatives Rash   Trulicity [Dulaglutide] Nausea And Vomiting    N&V, Dizziness   Social History   Socioeconomic History   Marital status: Married    Spouse name: Not on file   Number of children: 1   Years of education: 12   Highest education level: High school graduate  Occupational History   Occupation: [employer]  Tobacco Use   Smoking status: Former    Types: Cigarettes    Quit date: 08/30/1983    Years since quitting: 38.4   Smokeless tobacco: Former   Tobacco comments:    Quit 30 years ago.  Vaping Use   Vaping Use: Never used  Substance and Sexual Activity   Alcohol use: Yes    Comment: rare   Drug use: No   Sexual activity: Yes  Other Topics Concern   Not on file  Social History Narrative   Not on file   Social Determinants of Health   Financial Resource Strain: Low Risk  (09/02/2021)   Overall Financial Resource Strain (CARDIA)    Difficulty of Paying Living Expenses: Not hard at all  Food Insecurity: No Food Insecurity (09/02/2021)   Hunger Vital Sign    Worried About Running Out of Food in the Last Year: Never true    Ran Out of Food in the Last Year: Never true  Transportation Needs: No Transportation Needs (09/02/2021)   PRAPARE - Hydrologist (Medical): No    Lack of Transportation (Non-Medical): No  Physical Activity:  Inactive (09/02/2021)   Exercise Vital Sign    Days of Exercise per Week: 0 days    Minutes of Exercise per Session: 0 min  Stress: No Stress Concern Present (09/02/2021)   [place]    Feeling of Stress : Not at all  Social Connections: Moderately Isolated (09/02/2021)   Social Connection and Isolation Panel [NHANES]    Frequency of Communication with Friends and Family: More than three times a week    Frequency of Social Gatherings with Friends and Family: More than three times a week    Attends Religious Services: Never    Marine scientist or Organizations: No    Attends Archivist Meetings: Never    Marital Status: Married  Human resources officer Violence: Not At Risk (09/02/2021)   Humiliation, Afraid, Rape, and Kick questionnaire    Fear of Current or Ex-Partner: No    Emotionally Abused: No    Physically Abused: No    Sexually Abused: No     Review of Systems     Objective:   Physical Exam Vitals reviewed.  Constitutional:      Appearance: Normal appearance.  Eyes:     General: Lids are normal.        Left eye: No discharge.     Extraocular Movements:     Right eye: Normal extraocular motion.     Left eye: Normal extraocular motion.     Conjunctiva/sclera:     Right eye: Right conjunctiva is injected.     Left eye: Left conjunctiva is injected.  Cardiovascular:     Rate and Rhythm: Normal rate and regular rhythm.     Pulses: Normal pulses.     Heart sounds: Normal heart sounds. No murmur  heard. Pulmonary:     Effort: Pulmonary effort is normal. No respiratory distress.     Breath sounds: Normal breath sounds. No wheezing, rhonchi or rales.  Musculoskeletal:     Right lower leg: No edema.     Left lower leg: No edema.  Neurological:     Mental Status: He is alert.           Assessment & Plan:  Allergic rhinitis - Plan: methylPREDNISolone acetate (DEPO-MEDROL) injection 80  mg Patient request Depo-Medrol which she received 60 mg IM x1 with a prednisone taper pack.  We discussed the risk and benefits of glucocorticoids.  Patient states that he cannot tolerate with allergies and he has tried every conservative measure without benefit.  Therefore he is willing to accept the risk of "therapy.  Also recently reviewed lab work that he had at the [place] which included an A1c of 7.40, LDL cholesterol 37, ALT 127, normal CBC, normal CMP, and a normal urine microalbumin.  PSA was within normal limits L

## 2022-03-11 ENCOUNTER — Other Ambulatory Visit: Payer: Self-pay | Admitting: Family Medicine

## 2022-03-11 NOTE — Telephone Encounter (Signed)
Requested medication (s) are due for refill today - expired Rx  Requested medication (s) are on the active medication list -yes  Future visit scheduled -no  Last refill: 03/05/21 #90 3RF  Notes to clinic: Expired Rx  Requested Prescriptions  Pending Prescriptions Disp Refills   finasteride (PROSCAR) 5 MG tablet [Pharmacy Med Name: FINASTERIDE 5 MG TABLET] 90 tablet 3    Sig: TAKE 1 TABLET BY MOUTH EVERY DAY     Urology: 5-alpha Reductase Inhibitors Failed - 03/11/2022  9:15 AM      Failed - PSA in normal range and within 360 days    PSA  Date Value Ref Range Status  08/11/2020 0.39 < OR = 4.0 ng/mL Final    Comment:    The total PSA value from this assay system is  standardized against the WHO standard. The test  result will be approximately 20% lower when compared  to the equimolar-standardized total PSA (Beckman  Coulter). Comparison of serial PSA results should be  interpreted with this fact in mind. . This test was performed using the Siemens  chemiluminescent method. Values obtained from  different assay methods cannot be used interchangeably. PSA levels, regardless of value, should not be interpreted as absolute evidence of the presence or absence of disease.          Passed - Valid encounter within last 12 months    Recent Outpatient Visits           5 months ago Right groin pain   [place] [name], MD   7 months ago Type 2 diabetes mellitus with hyperlipidemia ([place])   [place] Pickard, [name], MD   9 months ago Colon cancer screening   [place] Pickard, [name], MD   11 months ago Hypercalcemia   [place] Dennard Schaumann, [name], MD   1 year ago Seasonal allergies   [place] Pickard, [name], MD                 Requested Prescriptions  Pending Prescriptions Disp Refills   finasteride (PROSCAR) 5 MG tablet [Pharmacy Med Name: FINASTERIDE 5 MG  TABLET] 90 tablet 3    Sig: TAKE 1 TABLET BY MOUTH EVERY DAY     Urology: 5-alpha Reductase Inhibitors Failed - 03/11/2022  9:15 AM      Failed - PSA in normal range and within 360 days    PSA  Date Value Ref Range Status  08/11/2020 0.39 < OR = 4.0 ng/mL Final    Comment:    The total PSA value from this assay system is  standardized against the WHO standard. The test  result will be approximately 20% lower when compared  to the equimolar-standardized total PSA (Beckman  Coulter). Comparison of serial PSA results should be  interpreted with this fact in mind. . This test was performed using the Siemens  chemiluminescent method. Values obtained from  different assay methods cannot be used interchangeably. PSA levels, regardless of value, should not be interpreted as absolute evidence of the presence or absence of disease.          Passed - Valid encounter within last 12 months    Recent Outpatient Visits           5 months ago Right groin pain   [place] [name], MD   7 months ago Type 2 diabetes mellitus with hyperlipidemia ([place])  [place] Pickard, [name], MD   9 months ago Colon cancer screening   [place] Dennard Schaumann [name], MD   11 months ago Hypercalcemia   [place] Dennard Schaumann, [name], MD   1 year ago Seasonal allergies   [place] Pickard, [name], MD

## 2022-03-15 DIAGNOSIS — H0288B Meibomian gland dysfunction left eye, upper and lower eyelids: Secondary | ICD-10-CM | POA: Diagnosis not present

## 2022-03-15 DIAGNOSIS — H0288A Meibomian gland dysfunction right eye, upper and lower eyelids: Secondary | ICD-10-CM | POA: Diagnosis not present

## 2022-03-18 ENCOUNTER — Other Ambulatory Visit: Payer: Self-pay | Admitting: Family Medicine

## 2022-03-18 NOTE — Telephone Encounter (Signed)
Requested Prescriptions  Pending Prescriptions Disp Refills  . JANUVIA 50 MG tablet [Pharmacy Med Name: JANUVIA 50 MG TABLET] 90 tablet 0    Sig: TAKE 1 TABLET BY MOUTH EVERY DAY     Endocrinology:  Diabetes - DPP-4 Inhibitors Failed - 03/18/2022  2:16 AM      Failed - HBA1C is between 0 and 7.9 and within 180 days    Hgb A1c MFr Bld  Date Value Ref Range Status  07/30/2021 7.0 (H) <5.7 % of total Hgb Final    Comment:    For someone without known diabetes, a hemoglobin A1c value of 6.5% or greater indicates that they may have  diabetes and this should be confirmed with a follow-up  test. . For someone with known diabetes, a value <7% indicates  that their diabetes is well controlled and a value  greater than or equal to 7% indicates suboptimal  control. A1c targets should be individualized based on  duration of diabetes, age, comorbid conditions, and  other considerations. . Currently, no consensus exists regarding use of hemoglobin A1c for diagnosis of diabetes for children. .          Passed - Cr in normal range and within 360 days    Creat  Date Value Ref Range Status  07/30/2021 0.82 0.70 - 1.28 mg/dL Final         Passed - Valid encounter within last 6 months    Recent Outpatient Visits          5 months ago Right groin pain   [place] [name], MD   7 months ago Type 2 diabetes mellitus with hyperlipidemia ([place])   [place] Pickard, [name], MD   9 months ago Colon cancer screening   [place] Pickard, [name], MD   11 months ago Hypercalcemia   [place] Pickard, [name], MD   1 year ago Seasonal allergies   [place] Pickard, [name], MD

## 2022-03-24 ENCOUNTER — Ambulatory Visit (INDEPENDENT_AMBULATORY_CARE_PROVIDER_SITE_OTHER): Payer: Medicare HMO

## 2022-03-24 DIAGNOSIS — I472 Ventricular tachycardia, unspecified: Secondary | ICD-10-CM

## 2022-03-24 LAB — CUP PACEART REMOTE DEVICE CHECK
Battery Remaining Longevity: 127 mo
Battery Voltage: 3.01 V
Brady Statistic RV Percent Paced: 0.01 %
Date Time Interrogation Session: 20230727061604
HighPow Impedance: 76 Ohm
Implantable Lead Implant Date: 20211028
Implantable Lead Location: 753860
Implantable Pulse Generator Implant Date: 20211028
Lead Channel Impedance Value: 399 Ohm
Lead Channel Impedance Value: 456 Ohm
Lead Channel Pacing Threshold Amplitude: 0.625 V
Lead Channel Pacing Threshold Pulse Width: 0.4 ms
Lead Channel Sensing Intrinsic Amplitude: 21 mV
Lead Channel Sensing Intrinsic Amplitude: 21 mV
Lead Channel Setting Pacing Amplitude: 2 V
Lead Channel Setting Pacing Pulse Width: 0.4 ms
Lead Channel Setting Sensing Sensitivity: 0.3 mV

## 2022-04-01 ENCOUNTER — Other Ambulatory Visit: Payer: Medicare HMO

## 2022-04-01 ENCOUNTER — Telehealth: Payer: Self-pay | Admitting: *Deleted

## 2022-04-01 DIAGNOSIS — I472 Ventricular tachycardia, unspecified: Secondary | ICD-10-CM

## 2022-04-01 DIAGNOSIS — Z79899 Other long term (current) drug therapy: Secondary | ICD-10-CM | POA: Diagnosis not present

## 2022-04-01 NOTE — Telephone Encounter (Signed)
Device interrogation reviewed. Lead parameters stable. 1 slow VT at 135 bpm lasting 78mn13sec.    TOtila Kluver can you get Mr SWojnarowskian appointment with Andy/Renee and order BMP and Mag?   Thanks!   CLysbeth GalasT. LQuentin Ore MD, FWilliamson [facility] FWake [facility] LLCCardiac Electrophysiology  Ordered/scheduled labs Scheduled APP appt.  Patient verbalized understanding and agreement.

## 2022-04-02 LAB — MAGNESIUM: Magnesium: 1.7 mg/dL (ref 1.6–2.3)

## 2022-04-02 LAB — BASIC METABOLIC PANEL
BUN/Creatinine Ratio: 13 (ref 10–24)
BUN: 12 mg/dL (ref 8–27)
CO2: 25 mmol/L (ref 20–29)
Calcium: 10 mg/dL (ref 8.6–10.2)
Chloride: 97 mmol/L (ref 96–106)
Creatinine, Ser: 0.93 mg/dL (ref 0.76–1.27)
Glucose: 137 mg/dL — ABNORMAL HIGH (ref 70–99)
Potassium: 4.3 mmol/L (ref 3.5–5.2)
Sodium: 137 mmol/L (ref 134–144)
eGFR: 86 mL/min/{1.73_m2} (ref >59)

## 2022-04-05 ENCOUNTER — Telehealth: Payer: Self-pay | Admitting: Cardiology

## 2022-04-05 NOTE — Telephone Encounter (Signed)
Follow Up:     Patient is calling for his lab resultsfrom Friday(04-01-22).

## 2022-04-05 NOTE — Telephone Encounter (Signed)
The patient has been notified of the result and verbalized understanding.  All questions (if any) were answered. Darrell Jewel, RN 04/05/2022 4:51 PM

## 2022-04-11 DIAGNOSIS — E119 Type 2 diabetes mellitus without complications: Secondary | ICD-10-CM | POA: Diagnosis not present

## 2022-04-13 ENCOUNTER — Encounter: Payer: Self-pay | Admitting: Student

## 2022-04-13 ENCOUNTER — Ambulatory Visit (INDEPENDENT_AMBULATORY_CARE_PROVIDER_SITE_OTHER): Payer: Medicare HMO | Admitting: Student

## 2022-04-13 VITALS — BP 110/60 | HR 70 | Ht 70.5 in | Wt 218.6 lb

## 2022-04-13 DIAGNOSIS — I5022 Chronic systolic (congestive) heart failure: Secondary | ICD-10-CM

## 2022-04-13 DIAGNOSIS — I2510 Atherosclerotic heart disease of native coronary artery without angina pectoris: Secondary | ICD-10-CM | POA: Diagnosis not present

## 2022-04-13 DIAGNOSIS — I472 Ventricular tachycardia, unspecified: Secondary | ICD-10-CM | POA: Diagnosis not present

## 2022-04-13 LAB — CUP PACEART INCLINIC DEVICE CHECK
Battery Remaining Longevity: 127 mo
Battery Voltage: 3.01 V
Brady Statistic RV Percent Paced: 0.01 %
Date Time Interrogation Session: 20230816113525
HighPow Impedance: 72 Ohm
Implantable Lead Implant Date: 20211028
Implantable Lead Location: 753860
Implantable Pulse Generator Implant Date: 20211028
Lead Channel Impedance Value: 399 Ohm
Lead Channel Impedance Value: 494 Ohm
Lead Channel Pacing Threshold Amplitude: 0.625 V
Lead Channel Pacing Threshold Pulse Width: 0.4 ms
Lead Channel Sensing Intrinsic Amplitude: 10 mV
Lead Channel Sensing Intrinsic Amplitude: 10.5 mV
Lead Channel Setting Pacing Amplitude: 3 V
Lead Channel Setting Pacing Pulse Width: 0.4 ms
Lead Channel Setting Sensing Sensitivity: 0.3 mV

## 2022-04-13 MED ORDER — MAGNESIUM OXIDE 400 MG PO TABS: 400.0000 mg | ORAL_TABLET | Freq: Every day | ORAL | 3 refills | Status: DC

## 2022-04-13 MED ORDER — LOSARTAN POTASSIUM 25 MG PO TABS: 25.0000 mg | ORAL_TABLET | Freq: Every day | ORAL | 2 refills | Status: DC

## 2022-04-13 MED ORDER — CARVEDILOL 12.5 MG PO TABS: 12.5000 mg | ORAL_TABLET | Freq: Two times a day (BID) | ORAL | 3 refills | Status: DC

## 2022-04-13 NOTE — Progress Notes (Signed)
Electrophysiology Office Note Date: 04/13/2022  ID:  Cameron, Daniel 1946/04/25, MRN [id number]  PCP: [name], MD Primary Cardiologist: Freada Bergeron, MD Electrophysiologist: Vickie Epley, MD   CC: Routine ICD follow-up  Cameron Daniel is a 76 y.o. male seen today for Vickie Epley, MD for routine electrophysiology followup.  Since last being seen in our clinic the patient reports doing well overall. He is working outside and doing chores without any difficulty. He has rare palpitations, but nothing sustained. No undue SOB, no CP, no syncope. He as not had ICD shocks  Device History: Medtronic Single Chamber ICD implanted 05/2020 for secondary prevention  Past Medical History:  Diagnosis Date   Allergic rhinitis, cause unspecified    Allergy    Anxiety    Arthritis    Cancer ([place])    a couple places removed from eyelid   Carotid Doppler 04/2020   Carotid US 9/21: No evidence of ICA stenosis bilaterally; right vertebral artery stenosis   Cataract    Chronic combined systolic (congestive) and diastolic (congestive) heart failure ([place])    Coronary atherosclerosis of unspecified type of vessel, native or graft    a. MI 1985 with unclear details. b. CABG 03/2019 with LIMA -> LAD, SVG -> OM, SVG to dRCA.   Depression    PTSD    Diverticulosis of colon (without mention of hemorrhage)    Esophageal reflux    Esophageal stricture    Essential hypertension    Former tobacco use    Hemorrhoids    Hyperlipidemia    ICD (implantable cardioverter-defibrillator) in place    Irritable bowel syndrome    Ischemic cardiomyopathy    Mild carotid artery disease ([place])    a. 1-39% bilaterally 03/2019 duplex.   Myocardial infarction (HCC)    Nocturia    Obesity, unspecified    Orthostasis    Other acquired absence of organ    Personal history of colonic polyps    Psychosexual dysfunction with inhibited sexual excitement    Retroperitoneal bleed     Type II or unspecified type diabetes mellitus without mention of complication, not stated as uncontrolled    VT (ventricular tachycardia) ([place])    Past Surgical History:  Procedure Laterality Date   ADENOIDECTOMY     CARPAL TUNNEL RELEASE     left    CHOLECYSTECTOMY     COLON RESECTION     18 inches   COLON SURGERY  2013   CORONARY ANGIOPLASTY     1985    CORONARY ARTERY BYPASS GRAFT N/A 04/26/2019   Procedure: CORONARY ARTERY BYPASS GRAFTING (CABG) x Three, using left internal mammary artery and right leg greater saphenous vein harvested endoscopically;  Surgeon: Gaye Pollack, MD;  Location: [facility];  Service: Open Heart Surgery;  Laterality: N/A;   ELBOW BURSA SURGERY     EYE SURGERY     ICD IMPLANT N/A 06/25/2020   Procedure: ICD IMPLANT;  Surgeon: Vickie Epley, MD;  Location: [facility];  Service: Cardiovascular;  Laterality: N/A;   LEFT HEART CATH AND CORS/GRAFTS ANGIOGRAPHY N/A 06/19/2020   Procedure: LEFT HEART CATH AND CORS/GRAFTS ANGIOGRAPHY;  Surgeon: Troy Sine, MD;  Location: [facility];  Service: Cardiovascular;  Laterality: N/A;   POLYPECTOMY     RIGHT/LEFT HEART CATH AND CORONARY ANGIOGRAPHY N/A 04/16/2019   Procedure: RIGHT/LEFT HEART CATH AND CORONARY ANGIOGRAPHY;  Surgeon: Belva Crome, MD;  Location: [facility];  Service: Cardiovascular;  Laterality: N/A;   SHOULDER SURGERY     Rt. Shoulder   TEE WITHOUT CARDIOVERSION N/A 04/26/2019   Procedure: TRANSESOPHAGEAL ECHOCARDIOGRAM (TEE);  Surgeon: Gaye Pollack, MD;  Location: [facility];  Service: Open Heart Surgery;  Laterality: N/A;   TONSILLECTOMY     V TACH ABLATION N/A 06/22/2020   Procedure: V TACH ABLATION;  Surgeon: Vickie Epley, MD;  Location: [facility];  Service: Cardiovascular;  Laterality: N/A;    Current Outpatient Medications  Medication Sig Dispense Refill   acetaminophen (TYLENOL) 500 MG tablet Take 1,000 mg by mouth at bedtime as needed for mild pain.      aspirin EC 81 MG tablet Take 1 tablet (81 mg total) by mouth daily. 90 tablet 3   azelastine (ASTELIN) 0.1 % nasal spray Place 2 sprays into both nostrils 2 (two) times daily. Use in each nostril as directed 30 mL 12   carvedilol (COREG) 6.25 MG tablet Take 1 tablet (6.25 mg total) by mouth 2 (two) times daily with a meal. 180 tablet 3   citalopram (CELEXA) 10 MG tablet TAKE 1 TABLET BY MOUTH EVERY DAY 90 tablet 2   colestipol (COLESTID) 5 g packet Take 5 g by mouth daily.      diphenoxylate-atropine (LOMOTIL) 2.5-0.025 MG tablet Take 2 tablets by mouth 4 (four) times daily as needed for diarrhea or loose stools. 30 tablet 0   fexofenadine (ALLEGRA) 180 MG tablet Take 1 tablet (180 mg total) by mouth daily as needed. For allergies 90 tablet 0   finasteride (PROSCAR) 5 MG tablet TAKE 1 TABLET BY MOUTH EVERY DAY 90 tablet 3   JANUVIA 50 MG tablet TAKE 1 TABLET BY MOUTH EVERY DAY 90 tablet 0   JARDIANCE 25 MG TABS tablet TAKE 25 MG (1 TABLET) BY MOUTH DAILY BEFORE BREAKFAST. STOP PIOGLITAZONE 90 tablet 1   losartan (COZAAR) 25 MG tablet TAKE 1 TABLET BY MOUTH EVERY DAY 90 tablet 2   loteprednol (LOTEMAX) 0.5 % ophthalmic suspension Place 1 drop into both eyes 4 (four) times daily as needed (itching). 5 mL 1   magnesium oxide (MAG-OX) 400 MG tablet Take 1 tablet (400 mg total) by mouth 2 (two) times daily. 60 tablet 11   montelukast (SINGULAIR) 10 MG tablet TAKE 1 TABLET BY MOUTH EVERYDAY AT BEDTIME 90 tablet 1   OVER THE COUNTER MEDICATION Balance of Nature     OVER THE COUNTER MEDICATION Take 1 tablet by mouth at bedtime. SUPER BETA PROSTATE     pantoprazole (PROTONIX) 40 MG tablet TAKE 1 TABLET BY MOUTH EVERY DAY IN THE MORNING 90 tablet 3   predniSONE (DELTASONE) 20 MG tablet 3 tabs poqday 1-2, 2 tabs poqday 3-4, 1 tab poqday 5-6 12 tablet 0   rosuvastatin (CRESTOR) 40 MG tablet TAKE 1 TABLET (40 MG TOTAL) BY MOUTH DAILY AT 6 PM. 90 tablet 3   Saccharomyces boulardii (PROBIOTIC) 250 MG CAPS  Take 1 capsule by mouth daily.     vitamin B-12 (CYANOCOBALAMIN) 100 MCG tablet Take 100 mcg by mouth daily.     No current facility-administered medications for this visit.    Allergies:   Doxazosin mesylate, Losartan, Testosterone, Flomax [tamsulosin hcl], Penicillins, Sulfonamide derivatives, and Trulicity [dulaglutide]   Social History: Social History   Socioeconomic History   Marital status: Married    Spouse name: Not on file   Number of children: 1   Years of education: 12   Highest  education level: High school graduate  Occupational History   Occupation: [employer]  Tobacco Use   Smoking status: Former    Types: Cigarettes    Quit date: 08/30/1983    Years since quitting: 38.6   Smokeless tobacco: Former   Tobacco comments:    Quit 30 years ago.  Vaping Use   Vaping Use: Never used  Substance and Sexual Activity   Alcohol use: Yes    Comment: rare   Drug use: No   Sexual activity: Yes  Other Topics Concern   Not on file  Social History Narrative   Not on file   Social Determinants of Health   Financial Resource Strain: Low Risk  (09/02/2021)   Overall Financial Resource Strain (CARDIA)    Difficulty of Paying Living Expenses: Not hard at all  Food Insecurity: No Food Insecurity (09/02/2021)   Hunger Vital Sign    Worried About Running Out of Food in the Last Year: Never true    Ran Out of Food in the Last Year: Never true  Transportation Needs: No Transportation Needs (09/02/2021)   PRAPARE - Hydrologist (Medical): No    Lack of Transportation (Non-Medical): No  Physical Activity: Inactive (09/02/2021)   Exercise Vital Sign    Days of Exercise per Week: 0 days    Minutes of Exercise per Session: 0 min  Stress: No Stress Concern Present (09/02/2021)   [place]    Feeling of Stress : Not at all  Social Connections: Moderately Isolated (09/02/2021)   Social Connection and  Isolation Panel [NHANES]    Frequency of Communication with Friends and Family: More than three times a week    Frequency of Social Gatherings with Friends and Family: More than three times a week    Attends Religious Services: Never    Marine scientist or Organizations: No    Attends Archivist Meetings: Never    Marital Status: Married  Human resources officer Violence: Not At Risk (09/02/2021)   Humiliation, Afraid, Rape, and Kick questionnaire    Fear of Current or Ex-Partner: No    Emotionally Abused: No    Physically Abused: No    Sexually Abused: No    Family History: Family History  Problem Relation Age of Onset   Lung cancer Father    Allergic rhinitis Father    Coronary artery disease Mother    Colon cancer Neg Hx    Heart attack Neg Hx    Hypertension Neg Hx    Stroke Neg Hx    Esophageal cancer Neg Hx    Rectal cancer Neg Hx    Stomach cancer Neg Hx    Pancreatic cancer Neg Hx     Review of Systems: All other systems reviewed and are otherwise negative except as noted above.   Physical Exam: Vitals:   04/13/22 1104  BP: 110/60  Pulse: 70  SpO2: 96%  Weight: 218 lb 9.6 oz (99.2 kg)  Height: 5' 10.5" (1.791 m)     GEN- The patient is well appearing, alert and oriented x 3 today.   HEENT: normocephalic, atraumatic; sclera clear, conjunctiva pink; hearing intact; oropharynx clear; neck supple, no JVP Lymph- no cervical lymphadenopathy Lungs- Clear to ausculation bilaterally, normal work of breathing.  No wheezes, rales, rhonchi Heart- Regular rate and rhythm, no murmurs, rubs or gallops, PMI not laterally displaced GI- soft, non-tender, non-distended, bowel sounds present, no hepatosplenomegaly Extremities-  no clubbing or cyanosis. No edema; DP/PT/radial pulses 2+ bilaterally MS- no significant deformity or atrophy Skin- warm and dry, no rash or lesion; ICD pocket well healed Psych- euthymic mood, full affect Neuro- strength and sensation are  intact  ICD interrogation- reviewed in detail today,  See PACEART report  EKG:  EKG is ordered today. Personal review shows NSR at 68 bpm, RBBB 154 ms  Recent Labs: 07/30/2021: ALT 29; Hemoglobin 16.4; Platelets 171 04/01/2022: BUN 12; Creatinine, Ser 0.93; Magnesium 1.7; Potassium 4.3; Sodium 137   Wt Readings from Last 3 Encounters:  04/13/22 218 lb 9.6 oz (99.2 kg)  02/08/22 225 lb (102.1 kg)  11/08/21 226 lb (102.5 kg)     Other studies Reviewed: Additional studies/ records that were reviewed today include: Previous EP office notes.   Assessment and Plan:  1.  Chronic systolic dysfunction s/p Medtronic single chamber ICD  euvolemic today Stable on an appropriate medical regimen Normal ICD function See Pace Art report No changes today  2. VT No further apparent true VT.  Re-reviewed recent episode with Dr. Quentin Ore, and suspect most likely aberrantly conducted SVT.  Pt has true NSVT episode which vary from the other prolonged episodes, which hover around detection.   3. CAD s/p CABG Denies s/s ischemia  4. SVT Increase coreg to 12.5 mg BID Suspect his longer episode (2-4 minutes) on his device are SVT given EGMs compared to his NSVT.  If they become more dense, we will monitor and restrict driving until they are identified.  He has never had any syncope.   Current medicines are reviewed at length with the patient today.     Labs/ tests ordered today include:  No orders of the defined types were placed in this encounter.   Disposition:   Follow up with EP APP in 6 months, sooner with issues or more frequent HVRs.   Jacalyn Lefevre, PA-C  04/13/2022 11:13 AM  [facility] [address] [place] [address] [phone] (office) [phone] (fax)

## 2022-04-13 NOTE — Patient Instructions (Signed)
Medication Instructions:  Your physician has recommended you make the following change in your medication:   INCREASE: Carvedilol 12.'5mg'$  twice daily Take Losartan at bedtime Magnesium Oxide '400mg'$  daily  *If you need a refill on your cardiac medications before your next appointment, please call your pharmacy*   Lab Work: None If you have labs (blood work) drawn today and your tests are completely normal, you will receive your results only by: [place] (if you have MyChart) OR A paper copy in the mail If you have any lab test that is abnormal or we need to change your treatment, we will call you to review the results.   Follow-Up: At [facility], you and your health needs are our priority.  As part of our continuing mission to provide you with exceptional heart care, we have created designated Provider Care Teams.  These Care Teams include your primary Cardiologist (physician) and Advanced Practice Providers (APPs -  Physician Assistants and Nurse Practitioners) who all work together to provide you with the care you need, when you need it.  We recommend signing up for the patient portal called "MyChart".  Sign up information is provided on this After Visit Summary.  MyChart is used to connect with patients for Virtual Visits (Telemedicine).  Patients are able to view lab/test results, encounter notes, upcoming appointments, etc.  Non-urgent messages can be sent to your provider as well.   To learn more about what you can do with MyChart, go to [email or web address].    Your next appointment:   6 month(s)  The format for your next appointment:   In Person  Provider:   Legrand Como "Oda Kilts, PA-C

## 2022-04-14 NOTE — Addendum Note (Signed)
Addended by: Carylon Perches on: 04/14/2022 08:05 AM   Modules accepted: Orders

## 2022-04-15 ENCOUNTER — Other Ambulatory Visit: Payer: Self-pay | Admitting: Family Medicine

## 2022-04-15 ENCOUNTER — Telehealth: Payer: Self-pay | Admitting: Family Medicine

## 2022-04-15 NOTE — Telephone Encounter (Signed)
Patient called to follow up with Dr. Dennard Schaumann on recent visit to cardiologist; stated they increased his carvedilol from 6.5 to 12.5 (BID). Patient's b/p is already low (110/60 at cardiologist). He's concerned the increased dose will have an adverse affect on him. Patient requesting call back from provider.  Please advise at [phone].

## 2022-04-15 NOTE — Progress Notes (Signed)
Remote ICD transmission.   

## 2022-04-20 DIAGNOSIS — M1711 Unilateral primary osteoarthritis, right knee: Secondary | ICD-10-CM | POA: Diagnosis not present

## 2022-04-22 ENCOUNTER — Ambulatory Visit (INDEPENDENT_AMBULATORY_CARE_PROVIDER_SITE_OTHER): Payer: Medicare HMO | Admitting: Family Medicine

## 2022-04-22 ENCOUNTER — Ambulatory Visit: Payer: Medicare HMO | Admitting: Family Medicine

## 2022-04-22 VITALS — BP 118/64 | HR 63 | Temp 97.6°F | Ht 70.5 in | Wt 220.0 lb

## 2022-04-22 DIAGNOSIS — B3742 Candidal balanitis: Secondary | ICD-10-CM | POA: Diagnosis not present

## 2022-04-22 DIAGNOSIS — J0110 Acute frontal sinusitis, unspecified: Secondary | ICD-10-CM | POA: Diagnosis not present

## 2022-04-22 MED ORDER — NYSTATIN 100000 UNIT/GM EX CREA: 1.0000 | TOPICAL_CREAM | Freq: Three times a day (TID) | CUTANEOUS | 1 refills | Status: DC

## 2022-04-22 MED ORDER — AZITHROMYCIN 250 MG PO TABS: ORAL_TABLET | ORAL | 0 refills | Status: DC

## 2022-04-22 NOTE — Progress Notes (Signed)
Subjective:    Patient ID: Cameron Daniel, male    DOB: 19-Nov-1945, 76 y.o.   MRN: [id number]  Sinus Problem  02/08/22 Patient reports severe allergies.  He reports itchy watery eyes.  He reports rhinorrhea and head congestion.  He reports postnasal drip and sneezing.  He reports cough.  He reports sore throat and drainage.  He denies any fevers or chills or sinus pain.  Symptoms of been going on now for several weeks despite taking Allegra, Flonase, Singulair, and Patanol.  He is requesting a cortisone injection.  04/22/22 Patient reports 2 concerns.  First he has a red itchy rash on his foreskin.  When he peels it back, there is white discharge on the glans penis.  It appears to be a yeast infection most likely due to the [place].  Has had this before.  Second issue is a sinus infection per his report.  He reports pain and pressure above his left eye.  He reports congestion despite taking Xyzal and Singulair.  He has not been using his nasal steroid spray Past Medical History:  Diagnosis Date   Allergic rhinitis, cause unspecified    Allergy    Anxiety    Arthritis    Cancer ([place])    a couple places removed from eyelid   Carotid Doppler 04/2020   Carotid US 9/21: No evidence of ICA stenosis bilaterally; right vertebral artery stenosis   Cataract    Chronic combined systolic (congestive) and diastolic (congestive) heart failure ([place])    Coronary atherosclerosis of unspecified type of vessel, native or graft    a. MI 1985 with unclear details. b. CABG 03/2019 with LIMA -> LAD, SVG -> OM, SVG to dRCA.   Depression    PTSD    Diverticulosis of colon (without mention of hemorrhage)    Esophageal reflux    Esophageal stricture    Essential hypertension    Former tobacco use    Hemorrhoids    Hyperlipidemia    ICD (implantable cardioverter-defibrillator) in place    Irritable bowel syndrome    Ischemic cardiomyopathy    Mild carotid artery disease ([place])    a. 1-39% bilaterally  03/2019 duplex.   Myocardial infarction (HCC)    Nocturia    Obesity, unspecified    Orthostasis    Other acquired absence of organ    Personal history of colonic polyps    Psychosexual dysfunction with inhibited sexual excitement    Retroperitoneal bleed    Type II or unspecified type diabetes mellitus without mention of complication, not stated as uncontrolled    VT (ventricular tachycardia) ([place])    Past Surgical History:  Procedure Laterality Date   ADENOIDECTOMY     CARPAL TUNNEL RELEASE     left    CHOLECYSTECTOMY     COLON RESECTION     18 inches   COLON SURGERY  2013   CORONARY ANGIOPLASTY     1985    CORONARY ARTERY BYPASS GRAFT N/A 04/26/2019   Procedure: CORONARY ARTERY BYPASS GRAFTING (CABG) x Three, using left internal mammary artery and right leg greater saphenous vein harvested endoscopically;  Surgeon: Gaye Pollack, MD;  Location: [facility];  Service: Open Heart Surgery;  Laterality: N/A;   ELBOW BURSA SURGERY     EYE SURGERY     ICD IMPLANT N/A 06/25/2020   Procedure: ICD IMPLANT;  Surgeon: Vickie Epley, MD;  Location: [facility];  Service: Cardiovascular;  Laterality: N/A;   LEFT  HEART CATH AND CORS/GRAFTS ANGIOGRAPHY N/A 06/19/2020   Procedure: LEFT HEART CATH AND CORS/GRAFTS ANGIOGRAPHY;  Surgeon: Troy Sine, MD;  Location: [facility];  Service: Cardiovascular;  Laterality: N/A;   POLYPECTOMY     RIGHT/LEFT HEART CATH AND CORONARY ANGIOGRAPHY N/A 04/16/2019   Procedure: RIGHT/LEFT HEART CATH AND CORONARY ANGIOGRAPHY;  Surgeon: Belva Crome, MD;  Location: [facility];  Service: Cardiovascular;  Laterality: N/A;   SHOULDER SURGERY     Rt. Shoulder   TEE WITHOUT CARDIOVERSION N/A 04/26/2019   Procedure: TRANSESOPHAGEAL ECHOCARDIOGRAM (TEE);  Surgeon: Gaye Pollack, MD;  Location: [facility];  Service: Open Heart Surgery;  Laterality: N/A;   TONSILLECTOMY     V TACH ABLATION N/A 06/22/2020   Procedure: V TACH ABLATION;  Surgeon:  Vickie Epley, MD;  Location: [facility];  Service: Cardiovascular;  Laterality: N/A;   Current Outpatient Medications on File Prior to Visit  Medication Sig Dispense Refill   acetaminophen (TYLENOL) 500 MG tablet Take 1,000 mg by mouth at bedtime as needed for mild pain.     aspirin EC 81 MG tablet Take 1 tablet (81 mg total) by mouth daily. 90 tablet 3   azelastine (ASTELIN) 0.1 % nasal spray Place 2 sprays into both nostrils 2 (two) times daily. Use in each nostril as directed 30 mL 12   carvedilol (COREG) 12.5 MG tablet Take 1 tablet (12.5 mg total) by mouth 2 (two) times daily with a meal. 180 tablet 3   citalopram (CELEXA) 10 MG tablet TAKE 1 TABLET BY MOUTH EVERY DAY 90 tablet 2   colestipol (COLESTID) 5 g packet Take 5 g by mouth daily.      diphenoxylate-atropine (LOMOTIL) 2.5-0.025 MG tablet Take 2 tablets by mouth 4 (four) times daily as needed for diarrhea or loose stools. 30 tablet 0   fexofenadine (ALLEGRA) 180 MG tablet Take 1 tablet (180 mg total) by mouth daily as needed. For allergies 90 tablet 0   finasteride (PROSCAR) 5 MG tablet TAKE 1 TABLET BY MOUTH EVERY DAY 90 tablet 3   JANUVIA 50 MG tablet TAKE 1 TABLET BY MOUTH EVERY DAY 90 tablet 0   JARDIANCE 25 MG TABS tablet TAKE 25 MG (1 TABLET) BY MOUTH DAILY BEFORE BREAKFAST. STOP PIOGLITAZONE 90 tablet 1   losartan (COZAAR) 25 MG tablet Take 1 tablet (25 mg total) by mouth at bedtime. 90 tablet 2   loteprednol (LOTEMAX) 0.5 % ophthalmic suspension Place 1 drop into both eyes 4 (four) times daily as needed (itching). 5 mL 1   magnesium oxide (MAG-OX) 400 MG tablet Take 1 tablet (400 mg total) by mouth daily. 90 tablet 3   montelukast (SINGULAIR) 10 MG tablet TAKE 1 TABLET BY MOUTH EVERYDAY AT BEDTIME 90 tablet 3   OVER THE COUNTER MEDICATION Balance of Nature     OVER THE COUNTER MEDICATION Take 1 tablet by mouth at bedtime. SUPER BETA PROSTATE     pantoprazole (PROTONIX) 40 MG tablet TAKE 1 TABLET BY MOUTH EVERY  DAY IN THE MORNING 90 tablet 3   predniSONE (DELTASONE) 20 MG tablet 3 tabs poqday 1-2, 2 tabs poqday 3-4, 1 tab poqday 5-6 12 tablet 0   rosuvastatin (CRESTOR) 40 MG tablet TAKE 1 TABLET (40 MG TOTAL) BY MOUTH DAILY AT 6 PM. 90 tablet 3   Saccharomyces boulardii (PROBIOTIC) 250 MG CAPS Take 1 capsule by mouth daily.     vitamin B-12 (CYANOCOBALAMIN) 100 MCG tablet Take 100  mcg by mouth daily.     No current facility-administered medications on file prior to visit.   Allergies  Allergen Reactions   Doxazosin Mesylate Other (See Comments)    Drops in blood pressure    Losartan     Other reaction(s): Muscle pain   Testosterone     Other reaction(s): Erythrocytosis Other reaction(s): Erythrocytosis   Flomax [Tamsulosin Hcl] Itching and Other (See Comments)    Drops in blood pressure     Penicillins Rash and Other (See Comments)    Has patient had a PCN reaction causing immediate rash, facial/tongue/throat swelling, SOB or lightheadedness with hypotension: No Has patient had a PCN reaction causing severe rash involving mucus membranes or skin necrosis: No Has patient had a PCN reaction that required hospitalization No Has patient had a PCN reaction occurring within the last 10 years: No If all of the above answers are "NO", then may proceed with Cephalosporin use.    Sulfonamide Derivatives Rash   Trulicity [Dulaglutide] Nausea And Vomiting    N&V, Dizziness   Social History   Socioeconomic History   Marital status: Married    Spouse name: Not on file   Number of children: 1   Years of education: 12   Highest education level: High school graduate  Occupational History   Occupation: [employer]  Tobacco Use   Smoking status: Former    Types: Cigarettes    Quit date: 08/30/1983    Years since quitting: 38.6   Smokeless tobacco: Former   Tobacco comments:    Quit 30 years ago.  Vaping Use   Vaping Use: Never used  Substance and Sexual Activity   Alcohol use: Yes    Comment: rare    Drug use: No   Sexual activity: Yes  Other Topics Concern   Not on file  Social History Narrative   Not on file   Social Determinants of Health   Financial Resource Strain: Low Risk  (09/02/2021)   Overall Financial Resource Strain (CARDIA)    Difficulty of Paying Living Expenses: Not hard at all  Food Insecurity: No Food Insecurity (09/02/2021)   Hunger Vital Sign    Worried About Running Out of Food in the Last Year: Never true    Ran Out of Food in the Last Year: Never true  Transportation Needs: No Transportation Needs (09/02/2021)   PRAPARE - Hydrologist (Medical): No    Lack of Transportation (Non-Medical): No  Physical Activity: Inactive (09/02/2021)   Exercise Vital Sign    Days of Exercise per Week: 0 days    Minutes of Exercise per Session: 0 min  Stress: No Stress Concern Present (09/02/2021)   [place]    Feeling of Stress : Not at all  Social Connections: Moderately Isolated (09/02/2021)   Social Connection and Isolation Panel [NHANES]    Frequency of Communication with Friends and Family: More than three times a week    Frequency of Social Gatherings with Friends and Family: More than three times a week    Attends Religious Services: Never    Marine scientist or Organizations: No    Attends Archivist Meetings: Never    Marital Status: Married  Human resources officer Violence: Not At Risk (09/02/2021)   Humiliation, Afraid, Rape, and Kick questionnaire    Fear of Current or Ex-Partner: No    Emotionally Abused: No    Physically Abused: No  Sexually Abused: No     Review of Systems     Objective:   Physical Exam Vitals reviewed.  Constitutional:      Appearance: Normal appearance.  Cardiovascular:     Rate and Rhythm: Normal rate and regular rhythm.     Pulses: Normal pulses.     Heart sounds: Normal heart sounds. No murmur heard. Pulmonary:      Effort: Pulmonary effort is normal. No respiratory distress.     Breath sounds: Normal breath sounds. No wheezing, rhonchi or rales.  Musculoskeletal:     Right lower leg: No edema.     Left lower leg: No edema.  Neurological:     Mental Status: He is alert.     Red itchy rash around the glans penis with white exudate on the glans penis.  Congestion and tenderness to percussion over the left thigh      Assessment & Plan:  Acute non-recurrent frontal sinusitis  Candidal balanitis Recommended that the patient use nystatin cream 3-4 times a day.  Dry the head of the penis thoroughly.  Apply the cream 3-4 times a day.  Temporarily hold Jardiance until better.  Recommended that he start Nasacort 2 sprays each nostril daily.  Give 5 days.  If symptoms are worsening he can start a Z-Pak but I believe the majority of his symptoms are due to ragweed allergies.

## 2022-04-27 DIAGNOSIS — M1711 Unilateral primary osteoarthritis, right knee: Secondary | ICD-10-CM | POA: Diagnosis not present

## 2022-05-05 ENCOUNTER — Encounter: Payer: Self-pay | Admitting: Family Medicine

## 2022-05-05 DIAGNOSIS — M1711 Unilateral primary osteoarthritis, right knee: Secondary | ICD-10-CM | POA: Diagnosis not present

## 2022-05-10 ENCOUNTER — Ambulatory Visit (INDEPENDENT_AMBULATORY_CARE_PROVIDER_SITE_OTHER): Payer: Medicare HMO | Admitting: Family Medicine

## 2022-05-10 VITALS — BP 120/68 | HR 69 | Temp 98.0°F | Ht 70.5 in | Wt 220.2 lb

## 2022-05-10 DIAGNOSIS — I5022 Chronic systolic (congestive) heart failure: Secondary | ICD-10-CM

## 2022-05-10 DIAGNOSIS — E1169 Type 2 diabetes mellitus with other specified complication: Secondary | ICD-10-CM | POA: Diagnosis not present

## 2022-05-10 DIAGNOSIS — R5383 Other fatigue: Secondary | ICD-10-CM

## 2022-05-10 DIAGNOSIS — E785 Hyperlipidemia, unspecified: Secondary | ICD-10-CM

## 2022-05-10 DIAGNOSIS — Z951 Presence of aortocoronary bypass graft: Secondary | ICD-10-CM | POA: Diagnosis not present

## 2022-05-10 NOTE — Progress Notes (Signed)
Subjective:    Patient ID: Cameron Daniel, male    DOB: 1945/10/02, 76 y.o.   MRN: [id number]  HPI Patient is a very pleasant 76 year old Caucasian gentleman with a history of coronary artery disease, type 2 diabetes, and congestive heart failure.  Patient has a history of an ICD placement due to episode of ventricular tachycardia.  Earlier this year his carvedilol was increased by his cardiologist due to a 2 to 4-minute run of supraventricular tachycardia.  Since that time, the patient reports feeling extremely weak and tired.  He denies any syncope or near syncope.  He denies any lightheadedness.  However he states that if he stands up rapidly, he feels weak and tired and lightheaded.  He denies any chest pain or shortness of breath.  However he does report feeling extremely tired with no energy.  He does not even want to go fishing.  Sometimes he feels a little dizzy.  He attributes this to the increase in carvedilol.  His blood pressure at home has been averaging between 973-532 with diastolic blood pressures in the 60s.  He denies any cough.  He denies any wheezing.  He denies any sore throat.  He denies any nausea or vomiting.  He denies any diarrhea.  He denies any melena or hematochezia.  He denies any dysuria. Past Medical History:  Diagnosis Date   Allergic rhinitis, cause unspecified    Allergy    Anxiety    Arthritis    Cancer ([place])    a couple places removed from eyelid   Carotid Doppler 04/2020   Carotid US 9/21: No evidence of ICA stenosis bilaterally; right vertebral artery stenosis   Cataract    Chronic combined systolic (congestive) and diastolic (congestive) heart failure (HCC)    Coronary atherosclerosis of unspecified type of vessel, native or graft    a. MI 1985 with unclear details. b. CABG 03/2019 with LIMA -> LAD, SVG -> OM, SVG to dRCA.   Depression    PTSD    Diverticulosis of colon (without mention of hemorrhage)    Esophageal reflux    Esophageal stricture     Essential hypertension    Former tobacco use    Hemorrhoids    Hyperlipidemia    ICD (implantable cardioverter-defibrillator) in place    Irritable bowel syndrome    Ischemic cardiomyopathy    Mild carotid artery disease ([place])    a. 1-39% bilaterally 03/2019 duplex.   Myocardial infarction (HCC)    Nocturia    Obesity, unspecified    Orthostasis    Other acquired absence of organ    Personal history of colonic polyps    Psychosexual dysfunction with inhibited sexual excitement    Retroperitoneal bleed    Type II or unspecified type diabetes mellitus without mention of complication, not stated as uncontrolled    VT (ventricular tachycardia) ([place])    Past Surgical History:  Procedure Laterality Date   ADENOIDECTOMY     CARPAL TUNNEL RELEASE     left    CHOLECYSTECTOMY     COLON RESECTION     18 inches   COLON SURGERY  2013   CORONARY ANGIOPLASTY     1985    CORONARY ARTERY BYPASS GRAFT N/A 04/26/2019   Procedure: CORONARY ARTERY BYPASS GRAFTING (CABG) x Three, using left internal mammary artery and right leg greater saphenous vein harvested endoscopically;  Surgeon: Gaye Pollack, MD;  Location: [facility];  Service: Open Heart Surgery;  Laterality: N/A;  ELBOW BURSA SURGERY     EYE SURGERY     ICD IMPLANT N/A 06/25/2020   Procedure: ICD IMPLANT;  Surgeon: Vickie Epley, MD;  Location: [place] [facility];  Service: Cardiovascular;  Laterality: N/A;   LEFT HEART CATH AND CORS/GRAFTS ANGIOGRAPHY N/A 06/19/2020   Procedure: LEFT HEART CATH AND CORS/GRAFTS ANGIOGRAPHY;  Surgeon: Troy Sine, MD;  Location: [facility];  Service: Cardiovascular;  Laterality: N/A;   POLYPECTOMY     RIGHT/LEFT HEART CATH AND CORONARY ANGIOGRAPHY N/A 04/16/2019   Procedure: RIGHT/LEFT HEART CATH AND CORONARY ANGIOGRAPHY;  Surgeon: Belva Crome, MD;  Location: [facility] [facility];  Service: Cardiovascular;  Laterality: N/A;   SHOULDER SURGERY     Rt. Shoulder   TEE WITHOUT  CARDIOVERSION N/A 04/26/2019   Procedure: TRANSESOPHAGEAL ECHOCARDIOGRAM (TEE);  Surgeon: Gaye Pollack, MD;  Location: [facility];  Service: Open Heart Surgery;  Laterality: N/A;   TONSILLECTOMY     V TACH ABLATION N/A 06/22/2020   Procedure: V TACH ABLATION;  Surgeon: Vickie Epley, MD;  Location: [facility];  Service: Cardiovascular;  Laterality: N/A;   Current Outpatient Medications on File Prior to Visit  Medication Sig Dispense Refill   acetaminophen (TYLENOL) 500 MG tablet Take 1,000 mg by mouth at bedtime as needed for mild pain.     aspirin EC 81 MG tablet Take 1 tablet (81 mg total) by mouth daily. 90 tablet 3   azelastine (ASTELIN) 0.1 % nasal spray Place 2 sprays into both nostrils 2 (two) times daily. Use in each nostril as directed 30 mL 12   azithromycin (ZITHROMAX) 250 MG tablet 2 tabs poqday1, 1 tab poqday 2-5 6 tablet 0   carvedilol (COREG) 12.5 MG tablet Take 1 tablet (12.5 mg total) by mouth 2 (two) times daily with a meal. 180 tablet 3   citalopram (CELEXA) 10 MG tablet TAKE 1 TABLET BY MOUTH EVERY DAY 90 tablet 2   colestipol (COLESTID) 5 g packet Take 5 g by mouth daily.      diphenoxylate-atropine (LOMOTIL) 2.5-0.025 MG tablet Take 2 tablets by mouth 4 (four) times daily as needed for diarrhea or loose stools. 30 tablet 0   fexofenadine (ALLEGRA) 180 MG tablet Take 1 tablet (180 mg total) by mouth daily as needed. For allergies 90 tablet 0   finasteride (PROSCAR) 5 MG tablet TAKE 1 TABLET BY MOUTH EVERY DAY 90 tablet 3   JANUVIA 50 MG tablet TAKE 1 TABLET BY MOUTH EVERY DAY 90 tablet 0   JARDIANCE 25 MG TABS tablet TAKE 25 MG (1 TABLET) BY MOUTH DAILY BEFORE BREAKFAST. STOP PIOGLITAZONE 90 tablet 1   losartan (COZAAR) 25 MG tablet Take 1 tablet (25 mg total) by mouth at bedtime. 90 tablet 2   loteprednol (LOTEMAX) 0.5 % ophthalmic suspension Place 1 drop into both eyes 4 (four) times daily as needed (itching). 5 mL 1   magnesium oxide (MAG-OX) 400 MG tablet Take  1 tablet (400 mg total) by mouth daily. 90 tablet 3   montelukast (SINGULAIR) 10 MG tablet TAKE 1 TABLET BY MOUTH EVERYDAY AT BEDTIME 90 tablet 3   nystatin cream (MYCOSTATIN) Apply 1 Application topically 3 (three) times daily. 30 g 1   OVER THE COUNTER MEDICATION Balance of Nature     OVER THE COUNTER MEDICATION Take 1 tablet by mouth at bedtime. SUPER BETA PROSTATE     pantoprazole (PROTONIX) 40 MG tablet TAKE 1 TABLET BY MOUTH EVERY DAY  IN THE MORNING 90 tablet 3   predniSONE (DELTASONE) 20 MG tablet 3 tabs poqday 1-2, 2 tabs poqday 3-4, 1 tab poqday 5-6 12 tablet 0   rosuvastatin (CRESTOR) 40 MG tablet TAKE 1 TABLET (40 MG TOTAL) BY MOUTH DAILY AT 6 PM. 90 tablet 3   Saccharomyces boulardii (PROBIOTIC) 250 MG CAPS Take 1 capsule by mouth daily.     vitamin B-12 (CYANOCOBALAMIN) 100 MCG tablet Take 100 mcg by mouth daily.     No current facility-administered medications on file prior to visit.   Allergies  Allergen Reactions   Doxazosin Mesylate Other (See Comments)    Drops in blood pressure    Losartan     Other reaction(s): Muscle pain   Testosterone     Other reaction(s): Erythrocytosis Other reaction(s): Erythrocytosis   Flomax [Tamsulosin Hcl] Itching and Other (See Comments)    Drops in blood pressure     Penicillins Rash and Other (See Comments)    Has patient had a PCN reaction causing immediate rash, facial/tongue/throat swelling, SOB or lightheadedness with hypotension: No Has patient had a PCN reaction causing severe rash involving mucus membranes or skin necrosis: No Has patient had a PCN reaction that required hospitalization No Has patient had a PCN reaction occurring within the last 10 years: No If all of the above answers are "NO", then may proceed with Cephalosporin use.    Sulfonamide Derivatives Rash   Trulicity [Dulaglutide] Nausea And Vomiting    N&V, Dizziness   Social History   Socioeconomic History   Marital status: Married    Spouse name: Not on  file   Number of children: 1   Years of education: 12   Highest education level: High school graduate  Occupational History   Occupation: [employer]  Tobacco Use   Smoking status: Former    Types: Cigarettes    Quit date: 08/30/1983    Years since quitting: 38.7   Smokeless tobacco: Former   Tobacco comments:    Quit 30 years ago.  Vaping Use   Vaping Use: Never used  Substance and Sexual Activity   Alcohol use: Yes    Comment: rare   Drug use: No   Sexual activity: Yes  Other Topics Concern   Not on file  Social History Narrative   Not on file   Social Determinants of Health   Financial Resource Strain: Low Risk  (09/02/2021)   Overall Financial Resource Strain (CARDIA)    Difficulty of Paying Living Expenses: Not hard at all  Food Insecurity: No Food Insecurity (09/02/2021)   Hunger Vital Sign    Worried About Running Out of Food in the Last Year: Never true    Ran Out of Food in the Last Year: Never true  Transportation Needs: No Transportation Needs (09/02/2021)   PRAPARE - Hydrologist (Medical): No    Lack of Transportation (Non-Medical): No  Physical Activity: Inactive (09/02/2021)   Exercise Vital Sign    Days of Exercise per Week: 0 days    Minutes of Exercise per Session: 0 min  Stress: No Stress Concern Present (09/02/2021)   [place]    Feeling of Stress : Not at all  Social Connections: Moderately Isolated (09/02/2021)   Social Connection and Isolation Panel [NHANES]    Frequency of Communication with Friends and Family: More than three times a week    Frequency of Social Gatherings with Friends and Family:  More than three times a week    Attends Religious Services: Never    Active Member of Clubs or Organizations: No    Attends Archivist Meetings: Never    Marital Status: Married  Human resources officer Violence: Not At Risk (09/02/2021)   Humiliation, Afraid, Rape, and  Kick questionnaire    Fear of Current or Ex-Partner: No    Emotionally Abused: No    Physically Abused: No    Sexually Abused: No     Review of Systems     Objective:   Physical Exam Vitals reviewed.  Constitutional:      General: He is not in acute distress.    Appearance: Normal appearance. He is obese. He is not ill-appearing or toxic-appearing.  HENT:     Right Ear: Tympanic membrane and ear canal normal.     Left Ear: Tympanic membrane and ear canal normal.     Nose: Nose normal.     Mouth/Throat:     Mouth: Mucous membranes are moist.  Eyes:     General: Lids are normal.     Extraocular Movements:     Right eye: Normal extraocular motion.     Left eye: Normal extraocular motion.     Conjunctiva/sclera:     Left eye: Left conjunctiva is not injected.  Cardiovascular:     Rate and Rhythm: Normal rate and regular rhythm.     Pulses: Normal pulses.     Heart sounds: Normal heart sounds. No murmur heard.    No friction rub. No gallop.  Pulmonary:     Effort: Pulmonary effort is normal. No respiratory distress.     Breath sounds: Normal breath sounds. No stridor. No wheezing, rhonchi or rales.  Abdominal:     General: Abdomen is flat. Bowel sounds are normal. There is no distension.     Palpations: Abdomen is soft. There is no mass.     Tenderness: There is no abdominal tenderness. There is no guarding or rebound.     Hernia: No hernia is present.  Musculoskeletal:     Right lower leg: No edema.     Left lower leg: No edema.     Right foot: Normal range of motion and normal capillary refill. Tenderness present. No swelling, prominent metatarsal heads or crepitus.     Left foot: Normal range of motion and normal capillary refill. No swelling, tenderness or crepitus.  Skin:    Findings: No rash.  Neurological:     General: No focal deficit present.     Mental Status: He is alert and oriented to person, place, and time. Mental status is at baseline.  Psychiatric:         Mood and Affect: Mood normal.        Behavior: Behavior normal.        Thought Content: Thought content normal.        Judgment: Judgment normal.           Assessment & Plan:  Type 2 diabetes mellitus with hyperlipidemia (HCC) - Plan: Hemoglobin A1c, CBC with Differential/Platelet, Lipid panel, Microalbumin, urine, COMPLETE METABOLIC PANEL WITH GFR  Chronic systolic congestive heart failure (HCC)  S/P CABG x 3  Fatigue, unspecified type - Plan: Vitamin B12, TSH, Testosterone Total,Free,Bio, Males Patient does appear a little bit pale particularly his conjunctiva.  I am concerned about possible anemia.  Therefore we will check a CBC.  Given his chronic fatigue I will check a TSH, vitamin B12 level, and  a testosterone level.  I will also obtain his fasting lab work including an A1c, microalbumin, lipid panel, and a CMP.  If lab work is completely normal, I suspect that a lot of his fatigue may be related to polypharmacy and I would recommend temporarily holding the losartan to see if he tolerates the higher dose of carvedilol better given the fact this is trying to prevent future runs of supraventricular tachycardia.  After discussing this, the patient also reports some burning in his face particularly in his cheeks especially in sunlight and also some itching and burning around his eyes.  Keep possible rosacea in the differential diagnosis although on exam today there is no obvious signs of this

## 2022-05-11 LAB — TESTOSTERONE TOTAL,FREE,BIO, MALES
Albumin: 4.6 g/dL (ref 3.6–5.1)
Sex Hormone Binding: 36 nmol/L (ref 22–77)
Testosterone, Bioavailable: 130.9 ng/dL (ref 15.0–150.0)
Testosterone, Free: 62.3 pg/mL (ref 6.0–73.0)
Testosterone: 500 ng/dL (ref 250–827)

## 2022-05-11 LAB — CBC WITH DIFFERENTIAL/PLATELET
Absolute Monocytes: 551 cells/uL (ref 200–950)
Basophils Absolute: 48 cells/uL (ref 0–200)
Basophils Relative: 0.7 %
Eosinophils Absolute: 61 cells/uL (ref 15–500)
Eosinophils Relative: 0.9 %
HCT: 47.1 % (ref 38.5–50.0)
Hemoglobin: 15.2 g/dL (ref 13.2–17.1)
Lymphs Abs: 1775 cells/uL (ref 850–3900)
MCH: 28.3 pg (ref 27.0–33.0)
MCHC: 32.3 g/dL (ref 32.0–36.0)
MCV: 87.5 fL (ref 80.0–100.0)
MPV: 10.6 fL (ref 7.5–12.5)
Monocytes Relative: 8.1 %
Neutro Abs: 4366 cells/uL (ref 1500–7800)
Neutrophils Relative %: 64.2 %
Platelets: 126 10*3/uL — ABNORMAL LOW (ref 140–400)
RBC: 5.38 10*6/uL (ref 4.20–5.80)
RDW: 14.5 % (ref 11.0–15.0)
Total Lymphocyte: 26.1 %
WBC: 6.8 10*3/uL (ref 3.8–10.8)

## 2022-05-11 LAB — COMPLETE METABOLIC PANEL WITH GFR
AG Ratio: 2.6 (calc) — ABNORMAL HIGH (ref 1.0–2.5)
ALT: 28 U/L (ref 9–46)
AST: 38 U/L — ABNORMAL HIGH (ref 10–35)
Albumin: 4.6 g/dL (ref 3.6–5.1)
Alkaline phosphatase (APISO): 95 U/L (ref 35–144)
BUN: 13 mg/dL (ref 7–25)
CO2: 26 mmol/L (ref 20–32)
Calcium: 10.6 mg/dL — ABNORMAL HIGH (ref 8.6–10.3)
Chloride: 98 mmol/L (ref 98–110)
Creat: 0.84 mg/dL (ref 0.70–1.28)
Globulin: 1.8 g/dL (calc) — ABNORMAL LOW (ref 1.9–3.7)
Glucose, Bld: 164 mg/dL — ABNORMAL HIGH (ref 65–99)
Potassium: 4 mmol/L (ref 3.5–5.3)
Sodium: 137 mmol/L (ref 135–146)
Total Bilirubin: 0.5 mg/dL (ref 0.2–1.2)
Total Protein: 6.4 g/dL (ref 6.1–8.1)
eGFR: 91 mL/min/{1.73_m2} (ref >=60)

## 2022-05-11 LAB — LIPID PANEL
Cholesterol: 116 mg/dL (ref <200)
HDL: 61 mg/dL (ref >=40)
LDL Cholesterol (Calc): 26 mg/dL (calc)
Non-HDL Cholesterol (Calc): 55 mg/dL (calc) (ref <130)
Total CHOL/HDL Ratio: 1.9 (calc) (ref <5.0)
Triglycerides: 233 mg/dL — ABNORMAL HIGH (ref <150)

## 2022-05-11 LAB — VITAMIN B12: Vitamin B-12: 1335 pg/mL — ABNORMAL HIGH (ref 200–1100)

## 2022-05-11 LAB — HEMOGLOBIN A1C
Hgb A1c MFr Bld: 7.1 % of total Hgb — ABNORMAL HIGH (ref <5.7)
Mean Plasma Glucose: 157 mg/dL
eAG (mmol/L): 8.7 mmol/L

## 2022-05-11 LAB — TSH: TSH: 1.4 mIU/L (ref 0.40–4.50)

## 2022-05-11 LAB — MICROALBUMIN, URINE: Microalb, Ur: 0.5 mg/dL

## 2022-05-21 ENCOUNTER — Other Ambulatory Visit: Payer: Self-pay | Admitting: Family Medicine

## 2022-05-23 NOTE — Telephone Encounter (Signed)
Requested medication (s) are due for refill today: Yes  Requested medication (s) are on the active medication list: Yes  Last refill:  05/20/21  Future visit scheduled: No  Notes to clinic:  Prescription has expired.    Requested Prescriptions  Pending Prescriptions Disp Refills   rosuvastatin (CRESTOR) 40 MG tablet [Pharmacy Med Name: ROSUVASTATIN CALCIUM 40 MG TAB] 90 tablet 3    Sig: TAKE 1 TABLET BY MOUTH DAILY AT 6 PM.     Cardiovascular:  Antilipid - Statins 2 Failed - 05/23/2022  9:49 AM      Failed - Lipid Panel in normal range within the last 12 months    Cholesterol  Date Value Ref Range Status  05/10/2022 116 <200 mg/dL Final   LDL Cholesterol (Calc)  Date Value Ref Range Status  05/10/2022 26 mg/dL (calc) Final    Comment:    Reference range: <100 . Desirable range <100 mg/dL for primary prevention;   <70 mg/dL for patients with CHD or diabetic patients  with > or = 2 CHD risk factors. Marland Kitchen LDL-C is now calculated using the Martin-Hopkins  calculation, which is a validated novel method providing  better accuracy than the Friedewald equation in the  estimation of LDL-C.  Cresenciano Genre et al. Annamaria Helling. 7169;678(93): 2061-2068  (http://education.[facility].com/faq/FAQ164)    Direct LDL  Date Value Ref Range Status  08/26/2013 49.9 mg/dL Final    Comment:    Optimal:  <100 mg/dLNear or Above Optimal:  100-129 mg/dLBorderline High:  130-159 mg/dLHigh:  160-189 mg/dLVery High:  >190 mg/dL   HDL  Date Value Ref Range Status  05/10/2022 61 > OR = 40 mg/dL Final   Triglycerides  Date Value Ref Range Status  05/10/2022 233 (H) <150 mg/dL Final    Comment:    . If a non-fasting specimen was collected, consider repeat triglyceride testing on a fasting specimen if clinically indicated.  Yates Decamp et al. J. of Clin. Lipidol. 8101;7:510-258. .    Triglyceride fasting, serum  Date Value Ref Range Status  08/08/2006 193 (H) 0 - 149 mg/dL Final    Comment:    See  lab report for associated comment(s)         Passed - Cr in normal range and within 360 days    Creat  Date Value Ref Range Status  05/10/2022 0.84 0.70 - 1.28 mg/dL Final         Passed - Patient is not pregnant      Passed - Valid encounter within last 12 months    Recent Outpatient Visits           7 months ago Right groin pain   [place] [name], MD   9 months ago Type 2 diabetes mellitus with hyperlipidemia ([place])   [place] [facility], [name], MD   11 months ago Colon cancer screening   [place] Pickard, [name], MD   1 year ago Hypercalcemia   [place], [name], MD   1 year ago Seasonal allergies   [place] Pickard, [name], MD

## 2022-05-26 ENCOUNTER — Telehealth: Payer: Self-pay | Admitting: Internal Medicine

## 2022-05-26 NOTE — Telephone Encounter (Signed)
Inbound call from patient stating that he is having issues with gas,abdominal discomfort and   loss of appetite. Patient was scheduled to see with [facility] 10/30 at 11:00. Patient is requesting a call back to discuss any recommendations. Please advise.

## 2022-05-26 NOTE — Telephone Encounter (Signed)
IB guard has not been working as well for him so he stopped it. He started align to see if that would help. Pt also instructed to take gasx for bloating.He has appt and is aware.

## 2022-06-08 ENCOUNTER — Ambulatory Visit (INDEPENDENT_AMBULATORY_CARE_PROVIDER_SITE_OTHER): Payer: Medicare HMO

## 2022-06-08 DIAGNOSIS — I5021 Acute systolic (congestive) heart failure: Secondary | ICD-10-CM

## 2022-06-08 DIAGNOSIS — E785 Hyperlipidemia, unspecified: Secondary | ICD-10-CM | POA: Diagnosis not present

## 2022-06-08 DIAGNOSIS — E1169 Type 2 diabetes mellitus with other specified complication: Secondary | ICD-10-CM | POA: Diagnosis not present

## 2022-06-08 DIAGNOSIS — Z23 Encounter for immunization: Secondary | ICD-10-CM | POA: Diagnosis not present

## 2022-06-08 DIAGNOSIS — M1711 Unilateral primary osteoarthritis, right knee: Secondary | ICD-10-CM | POA: Diagnosis not present

## 2022-06-08 DIAGNOSIS — M25561 Pain in right knee: Secondary | ICD-10-CM | POA: Diagnosis not present

## 2022-06-16 ENCOUNTER — Other Ambulatory Visit: Payer: Self-pay | Admitting: Family Medicine

## 2022-06-16 NOTE — Telephone Encounter (Signed)
OV 05/10/22 Requested Prescriptions  Pending Prescriptions Disp Refills  . citalopram (CELEXA) 10 MG tablet [Pharmacy Med Name: CITALOPRAM HBR 10 MG TABLET] 90 tablet 2    Sig: TAKE 1 TABLET BY MOUTH EVERY DAY     Psychiatry:  Antidepressants - SSRI Failed - 06/16/2022  3:38 AM      Failed - Valid encounter within last 6 months    Recent Outpatient Visits          8 months ago Right groin pain   [facility] [place] [name], MD   10 months ago Type 2 diabetes mellitus with hyperlipidemia ([place])   [facility] Pick[facility], [name], MD   1 year ago Colon cancer screening   [place] [name], MD   1 year ago Hypercalcemia   [facility] [name], MD   1 year ago Seasonal allergies   [place] Pickard, [name], MD

## 2022-06-17 ENCOUNTER — Telehealth: Payer: Self-pay | Admitting: Cardiology

## 2022-06-17 NOTE — Telephone Encounter (Signed)
Patient states he is supposed to have an MRI at [facility] and they have not received an okay yet. He says they can be reached at: [phone]. He also would like a call back to follow up on it and states it is okay to leave a detailed message.

## 2022-06-20 NOTE — Telephone Encounter (Signed)
Advised the patient that the device clinic faxed over information on his device and that it is MRI/CT compatible.  Verbalized understanding and thankful for the call.

## 2022-06-20 NOTE — Telephone Encounter (Signed)
Wanda Plump, RN      01/17/22 10:56 AM Note   LVM informing patient that his device is MRI and CT compatible

## 2022-06-23 ENCOUNTER — Ambulatory Visit (INDEPENDENT_AMBULATORY_CARE_PROVIDER_SITE_OTHER): Payer: Medicare HMO

## 2022-06-23 DIAGNOSIS — I472 Ventricular tachycardia, unspecified: Secondary | ICD-10-CM | POA: Diagnosis not present

## 2022-06-24 LAB — CUP PACEART REMOTE DEVICE CHECK
Battery Remaining Longevity: 125 mo
Battery Voltage: 3.02 V
Brady Statistic RV Percent Paced: 0.01 %
Date Time Interrogation Session: 20231026031805
HighPow Impedance: 74 Ohm
Implantable Lead Connection Status: 753985
Implantable Lead Implant Date: 20211028
Implantable Lead Location: 753860
Implantable Pulse Generator Implant Date: 20211028
Lead Channel Impedance Value: 380 Ohm
Lead Channel Impedance Value: 456 Ohm
Lead Channel Pacing Threshold Amplitude: 0.625 V
Lead Channel Pacing Threshold Pulse Width: 0.4 ms
Lead Channel Sensing Intrinsic Amplitude: 20.375 mV
Lead Channel Sensing Intrinsic Amplitude: 20.375 mV
Lead Channel Setting Pacing Amplitude: 2 V
Lead Channel Setting Pacing Pulse Width: 0.4 ms
Lead Channel Setting Sensing Sensitivity: 0.3 mV
Zone Setting Status: 755011
Zone Setting Status: 755011

## 2022-06-27 ENCOUNTER — Encounter: Payer: Self-pay | Admitting: Nurse Practitioner

## 2022-06-27 ENCOUNTER — Telehealth: Payer: Self-pay

## 2022-06-27 ENCOUNTER — Ambulatory Visit: Payer: Medicare HMO | Admitting: Nurse Practitioner

## 2022-06-27 VITALS — BP 118/62 | HR 78 | Ht 70.0 in | Wt 216.6 lb

## 2022-06-27 DIAGNOSIS — R140 Abdominal distension (gaseous): Secondary | ICD-10-CM | POA: Diagnosis not present

## 2022-06-27 DIAGNOSIS — R142 Eructation: Secondary | ICD-10-CM | POA: Diagnosis not present

## 2022-06-27 MED ORDER — FAMOTIDINE 20 MG PO TABS: 20.0000 mg | ORAL_TABLET | Freq: Every day | ORAL | 2 refills | Status: DC

## 2022-06-27 NOTE — Patient Instructions (Addendum)
We have sent the following medications to your pharmacy for you to pick up at your convenience: Famotidine 20 mg  Follow up with Dr.Perry in 3 months.  You have been given a testing kit to check for small intestine bacterial overgrowth (SIBO) which is completed by a company named [facility]. Make sure to return your test in the mail using the return mailing label given to you along with the kit. Your demographic and insurance information have already been sent to the company and they should be in contact with you over the next 1-2 weeks regarding this test. [facility] will collect an upfront charge of $99.74 for commercial insurance plans and $209.74 is you are paying cash. Make sure to discuss with [facility] PRIOR to having the test to see if they have gotten information from your insurance company as to how much your testing will cost out of pocket, if any. Please keep in mind that you will be getting a call from phone number [phone] or a similar number. If you do not hear from them within this time frame, please call our office at [phone] or call [facility] directly at [phone].    It was a pleasure to see you today!  Thank you for trusting me with your gastrointestinal care!

## 2022-06-27 NOTE — Progress Notes (Signed)
06/27/2022 Cameron Daniel [id number] 02-26-1946   Chief Complaint: Abdominal bloat, decreased appetite  History of Present Illness: Cameron Daniel is a 76 year old male with a past medical history of hypertension, coronary artery disease s/p CABG 03/1999, VT s/p ablation and ICD placement 05/2020, ischemic cardiomyopathy, hyperlipidemia, obesity, DM type II, GERD, diverticular disease s/p segmental resection 2013 and adenomatous colon polyps. He is followed by Dr. Henrene Pastor. He complains of having increased burping and gas per the rectum x 6 weeks. He continues to have chronic intermittent abdominal bloat.  No significant abdominal pain.  He started taking align probiotic 6 weeks ago.  He sometimes takes Pepto-Bismol which reduces his abdominal gas.  No active heartburn symptoms on Pantoprazole 40 mg once daily.  He tries to avoid fried foods.  He is alternating constipation and diarrhea.  He has a history of postcholecystectomy diarrhea which is typically well controlled on colestipol 5 g packet once daily.  If he stopped taking colestipol he develops urgent severe diarrhea therefore even when he is constipated he continues to take colestipol.  He rarely takes Lomotil, last took a dose 6 or 8 weeks ago.  He drinks 2 cups of coffee in the morning.  He uses lactose-free milk.  He tried using IBgard in the past which resulted in excessive peppermint taste which was bothersome.  His appetite has been decreased for few months.  He has lost 4 to 5 pounds over the past 2 to 3 months.  His most recent colonoscopy was 11/08/2021 which identified 6 tubular adenomatous/hyperplastic polyps removed from the colon.      Latest Ref Rng & Units 05/10/2022   11:05 AM 07/30/2021    2:33 PM 01/15/2021    8:47 AM  CBC  WBC 3.8 - 10.8 Thousand/uL 6.8  7.5  5.5   Hemoglobin 13.2 - 17.1 g/dL 15.2  16.4  15.2   Hematocrit 38.5 - 50.0 % 47.1  48.2  46.3   Platelets 140 - 400 Thousand/uL 126  171  136         Latest Ref Rng & Units 05/10/2022   11:05 AM 04/01/2022    1:34 PM 07/30/2021    2:33 PM  CMP  Glucose 65 - 99 mg/dL 164  137  123   BUN 7 - 25 mg/dL '13  12  14   '$ Creatinine 0.70 - 1.28 mg/dL 0.84  0.93  0.82   Sodium 135 - 146 mmol/L 137  137  136   Potassium 3.5 - 5.3 mmol/L 4.0  4.3  4.3   Chloride 98 - 110 mmol/L 98  97  100   CO2 20 - 32 mmol/L '26  25  30   '$ Calcium 8.6 - 10.3 mg/dL 10.6  10.0  10.2   Total Protein 6.1 - 8.1 g/dL 6.4   6.5   Total Bilirubin 0.2 - 1.2 mg/dL 0.5   0.7   AST 10 - 35 U/L 38   34   ALT 9 - 46 U/L 28   29      CTAP with contrast 11/01/2019: Lower chest: Mitral annular calcifications. Signs of sub endocardial fat deposition compatible with prior myocardial infarction. Coronary artery calcifications. Median sternotomy changes. No effusion or signs of consolidation at the lung bases.   Hepatobiliary: No signs of focal hepatic lesion. Portal vein is patent. No signs of biliary ductal dilation following cholecystectomy.   Pancreas: Pancreas is normal. No signs of peripancreatic inflammation or ductal dilation.  Spleen: Spleen is normal size without focal lesion.   Adrenals/Urinary Tract: Adrenal glands are normal.   Kidney on the right is mildly displaced anteriorly, not to the extent that it was on the study of 04/18/2019 is where there was a large right retroperitoneal hematoma. There is stranding at the site of previous retroperitoneal hematoma. There is no fluid collection in this location or is there evidence of gas or other signs of infection. Symmetric renal enhancement. No signs of hydronephrosis.   Stomach/Bowel: No signs of acute gastrointestinal process. The appendix is normal. Scattered colonic diverticulosis.   Vascular/Lymphatic: Calcified atherosclerotic changes throughout the abdominal aorta extending in the iliac vessels. No signs of aneurysm. No signs of adenopathy.   No sign of pelvic adenopathy.   Reproductive: Prostate  with mild heterogeneity.   Other: No signs of free air. No abscess.   Musculoskeletal: Spinal degenerative changes. No acute or destructive bone process.   IMPRESSION: 1. No acute findings to explain the patient's right lower quadrant pain. 2. Near complete resolution of changes associated with right lower quadrant retroperitoneal hematoma, now with some fat stranding, no fluid or gas to suggest infection findings are felt to be entirely related to previous hematoma. 3. Colonic diverticulosis without evidence of acute gastrointestinal process. 4. Aortic atherosclerosis.  RECENT GI PROCEDURES:   Colonoscopy 11/08/2021: - Five 2 to 4 mm polyps in the descending colon, in the transverse colon and in the ascending colon, removed with a cold snare. Resected and retrieved. - One less than 1 mm polyp in the ascending colon, removed with a jumbo cold forceps. Resected and retrieved. - Diverticulosis in the entire examined colon. Status post sigmoid colectomy. - The examination was otherwise normal on direct and retroflexion views. 1. Surgical [P], colon, ascending, polyp (3) - TUBULAR ADENOMA(S) WITHOUT HIGH-GRADE DYSPLASIA OR MALIGNANCY - HYPERPLASTIC POLYP 2. Surgical [P], colon, transverse x 2, polyp (2) - TUBULAR ADENOMA(S) WITHOUT HIGH-GRADE DYSPLASIA OR MALIGNANCY - 3 year recall colonoscopy   Current Outpatient Medications on File Prior to Visit  Medication Sig Dispense Refill   acetaminophen (TYLENOL) 500 MG tablet Take 1,000 mg by mouth at bedtime as needed for mild pain.     aspirin EC 81 MG tablet Take 1 tablet (81 mg total) by mouth daily. 90 tablet 3   azelastine (ASTELIN) 0.1 % nasal spray Place 2 sprays into both nostrils 2 (two) times daily. Use in each nostril as directed 30 mL 12   carvedilol (COREG) 12.5 MG tablet Take 1 tablet (12.5 mg total) by mouth 2 (two) times daily with a meal. 180 tablet 3   citalopram (CELEXA) 10 MG tablet TAKE 1 TABLET BY MOUTH EVERY DAY 90  tablet 1   colestipol (COLESTID) 5 g packet Take 5 g by mouth daily.      diphenoxylate-atropine (LOMOTIL) 2.5-0.025 MG tablet Take 2 tablets by mouth 4 (four) times daily as needed for diarrhea or loose stools. 30 tablet 0   fexofenadine (ALLEGRA) 180 MG tablet Take 1 tablet (180 mg total) by mouth daily as needed. For allergies 90 tablet 0   finasteride (PROSCAR) 5 MG tablet TAKE 1 TABLET BY MOUTH EVERY DAY 90 tablet 3   JANUVIA 50 MG tablet TAKE 1 TABLET BY MOUTH EVERY DAY 90 tablet 0   JARDIANCE 25 MG TABS tablet TAKE 25 MG (1 TABLET) BY MOUTH DAILY BEFORE BREAKFAST. STOP PIOGLITAZONE 90 tablet 1   loteprednol (LOTEMAX) 0.5 % ophthalmic suspension Place 1 drop into both eyes 4 (four)  times daily as needed (itching). 5 mL 1   magnesium oxide (MAG-OX) 400 MG tablet Take 1 tablet (400 mg total) by mouth daily. 90 tablet 3   montelukast (SINGULAIR) 10 MG tablet TAKE 1 TABLET BY MOUTH EVERYDAY AT BEDTIME 90 tablet 3   nystatin cream (MYCOSTATIN) Apply 1 Application topically 3 (three) times daily. 30 g 1   pantoprazole (PROTONIX) 40 MG tablet TAKE 1 TABLET BY MOUTH EVERY DAY IN THE MORNING 90 tablet 3   rosuvastatin (CRESTOR) 40 MG tablet TAKE 1 TABLET BY MOUTH DAILY AT 6 PM. 90 tablet 3   vitamin B-12 (CYANOCOBALAMIN) 100 MCG tablet Take 100 mcg by mouth daily.     losartan (COZAAR) 25 MG tablet Take 1 tablet (25 mg total) by mouth at bedtime. (Patient not taking: Reported on 06/27/2022) 90 tablet 2   No current facility-administered medications on file prior to visit.   Allergies  Allergen Reactions   Doxazosin Mesylate Other (See Comments)    Drops in blood pressure    Losartan     Other reaction(s): Muscle pain   Testosterone     Other reaction(s): Erythrocytosis Other reaction(s): Erythrocytosis   Flomax [Tamsulosin Hcl] Itching and Other (See Comments)    Drops in blood pressure     Penicillins Rash and Other (See Comments)    Has patient had a PCN reaction causing immediate rash,  facial/tongue/throat swelling, SOB or lightheadedness with hypotension: No Has patient had a PCN reaction causing severe rash involving mucus membranes or skin necrosis: No Has patient had a PCN reaction that required hospitalization No Has patient had a PCN reaction occurring within the last 10 years: No If all of the above answers are "NO", then may proceed with Cephalosporin use.    Sulfonamide Derivatives Rash   Trulicity [Dulaglutide] Nausea And Vomiting    N&V, Dizziness    Current Medications, Allergies, Past Medical History, Past Surgical History, Family History and Social History were reviewed in [employer] record.  Review of Systems:   Constitutional: Negative for fever, sweats, chills or weight loss.  Respiratory: Negative for shortness of breath.   Cardiovascular: Negative for chest pain, palpitations and leg swelling.  Gastrointestinal: See HPI.  Musculoskeletal: Negative for back pain or muscle aches.  Neurological: Negative for dizziness, headaches or paresthesias.   Physical Exam: BP 118/62   Pulse 78   Ht '5\' 10"'$  (1.778 m)   Wt 216 lb 9.6 oz (98.2 kg)   SpO2 98%   BMI 31.08 kg/m   Wt Readings from Last 3 Encounters:  06/27/22 216 lb 9.6 oz (98.2 kg)  05/10/22 220 lb 3.2 oz (99.9 kg)  04/22/22 220 lb (99.8 kg)    General: 76 year old male in no acute distress. Head: Normocephalic and atraumatic. Eyes: No scleral icterus. Conjunctiva pink . Ears: Normal auditory acuity. Mouth: Dentition intact. No ulcers or lesions.  Lungs: Clear throughout to auscultation. Heart: Regular rate and rhythm, no murmur.  Left chest wall with palpable ICD. Mediastinal scar intact. Abdomen: Soft, nontender and nondistended. No masses or hepatomegaly. Normal bowel sounds x 4 quadrants.  Diastasis recti. Rectal: Deferred. Musculoskeletal: Symmetrical with no gross deformities. Extremities: No edema. Neurological: Alert oriented x 4. No focal deficits.   Psychological: Alert and cooperative. Normal mood and affect  Assessment and Recommendations:  50) 76 year old male with a history of GERD with increased burping. -Continue Pantoprazole 40 mg once daily.  -Add Famotidine 20 mg 1 p.o. nightly -Patient to contact office  if symptoms worsen -Follow-up in 3 months  2) Abdominal bloat, chronic. CTAP 10/2019 showed a normal pancreas. -Continue probiotic of choice once daily -Continue lactose-free milk  3) History of colon polyps.  Five tubular adenomatous polyps and one hyperplastic polyp removed from the colon per colonoscopy 10/2011.  4) Decreased appetite, 5 lbs weight loss -Consider repeat abdominal imaging if weight loss persists  5) IBS, alternating diarrhea/constipation -Patient wishes to continue Colestipol without interruption as discontinuing this medication results in unacceptable urgent diarrhea -Benefiber as tolerated -Continue probiotic of choice as noted above

## 2022-06-27 NOTE — Telephone Encounter (Signed)
Pt c/o itching and watering to both eyes for over 1 week. Pt states he has tried Pataday but it has not helped. Is there anything else he can use? Thank you.

## 2022-06-28 ENCOUNTER — Other Ambulatory Visit: Payer: Self-pay

## 2022-06-28 ENCOUNTER — Other Ambulatory Visit: Payer: Self-pay | Admitting: Family Medicine

## 2022-06-28 DIAGNOSIS — M25561 Pain in right knee: Secondary | ICD-10-CM | POA: Diagnosis not present

## 2022-06-28 DIAGNOSIS — M23361 Other meniscus derangements, other lateral meniscus, right knee: Secondary | ICD-10-CM | POA: Diagnosis not present

## 2022-06-28 DIAGNOSIS — M7121 Synovial cyst of popliteal space [Baker], right knee: Secondary | ICD-10-CM | POA: Diagnosis not present

## 2022-06-28 DIAGNOSIS — M23300 Other meniscus derangements, unspecified lateral meniscus, right knee: Secondary | ICD-10-CM | POA: Diagnosis not present

## 2022-06-28 DIAGNOSIS — M1711 Unilateral primary osteoarthritis, right knee: Secondary | ICD-10-CM | POA: Diagnosis not present

## 2022-06-28 MED ORDER — ZERVIATE 0.24 % OP SOLN: 1.0000 [drp] | Freq: Two times a day (BID) | OPHTHALMIC | 3 refills | Status: DC

## 2022-06-28 MED ORDER — PANTOPRAZOLE SODIUM 40 MG PO TBEC: DELAYED_RELEASE_TABLET | ORAL | 3 refills | Status: DC

## 2022-06-28 NOTE — Telephone Encounter (Signed)
Requested Prescriptions  Pending Prescriptions Disp Refills  . pantoprazole (PROTONIX) 40 MG tablet 90 tablet 3     Gastroenterology: Proton Pump Inhibitors Passed - 06/28/2022  3:53 PM      Passed - Valid encounter within last 12 months    Recent Outpatient Visits          9 months ago Right groin pain   [place] [name], MD   11 months ago Type 2 diabetes mellitus with hyperlipidemia ([place])   [facility] Pickard, [name], MD   1 year ago Colon cancer screening   [place] [name], MD   1 year ago Hypercalcemia   [place] [name], MD   1 year ago Seasonal allergies   [place] Pickard, [name], MD      Future Appointments            In 2 weeks Johney Frame, Greer Ee, MD [facility] [place]. [facility], [facility]

## 2022-06-28 NOTE — Telephone Encounter (Signed)
  Prescription Request  06/28/2022  Is this a "Controlled Substance" medicine? No  LOV: 06/27/2022   What is the name of the medication or equipment? pantoprazole (PROTONIX) 40   Have you contacted your pharmacy to request a refill? Yes   Which pharmacy would you like this sent to? [id number] , [place]  Patient notified that their request is being sent to the clinical staff for review and that they should receive a response within 2 business days.   Please advise at [phone]

## 2022-06-30 NOTE — Progress Notes (Signed)
Remote ICD transmission.   

## 2022-07-06 DIAGNOSIS — H00022 Hordeolum internum right lower eyelid: Secondary | ICD-10-CM | POA: Diagnosis not present

## 2022-07-08 DIAGNOSIS — M1711 Unilateral primary osteoarthritis, right knee: Secondary | ICD-10-CM | POA: Diagnosis not present

## 2022-07-16 NOTE — Progress Notes (Unsigned)
Cardiology Office Note:    Date:  07/16/2022   ID:  SHAUNN TACKITT, DOB [birth year], MRN [id number]  PCP:  [name], MD   [facility] Providers Cardiologist:  Freada Bergeron, MD Electrophysiologist:  Vickie Epley, MD  Advanced Heart Failure:  Glori Bickers, MD  {   Referring MD: [name], MD    History of Present Illness:    Cameron Daniel is a 76 y.o. male with a hx of CAD s/p CABG with LIMA-LAD, SVG-OM, SVG-dRCA, systolic HF with LVEF 97-35%, VT s/p ablation 05/2020 with ICD implantation, HTN, and HLD who was previously followed by Dr. Meda Coffee who now returns to clinic for follow-up.  Per review of the record, the patient had MI in 1985 with unclear intervention, treated by [name] at that time. Aortoiliac duplex 2014 showed no AAA. He did well for many years before presenting back to the ED August 2020 with fever and malaise. He was seen in the ER and given fluids and discharged but returned back with respiratory failure requiring intubation for PNA and CHF and NSTEMI. 2D echo 04/13/19 showed EF 25-30%, severe hypokinesis of the left ventricular, entire inferior wall and anteroseptal wall, mild LAE, small pericardial effusion. Covid testing was negative. Cath showed 3V CAD with low output. He was started on milrinone post-cath. He developed a large spontaneous RP bleed post cath (cath all done from arm). He ultimately underwent CABG on 04/26/19. Pre-op TEE EF 45-50%. Post CABG course was fortunately uneventful. Spironolactone was later stopped due to hyperkalemia, olmestartan stopped due to hypotension, and he followed with CHF team for a period of time before graduating from their services. He has since tolerated losartan OK. In 05/2020 he was admitted with persistent VT. He was initially treated with amiodarone but ultimately cardioverted in ED. LHC showed patent grafts without specific culprit. He was seen by EP and underwent EPS/ablation with scar  modification and ICD implantation. 2D Echo 05/2020 showed EF 45-50%, mild LVH, grade 2 DD, moderate LAE, normal RV.  Last seen in 09/2021 where he was doing well from a CV standpoint.  Today, ***   Past Medical History:  Diagnosis Date   Allergic rhinitis, cause unspecified    Allergy    Anxiety    Arthritis    Cancer ([place])    a couple places removed from eyelid   Carotid Doppler 04/2020   Carotid US 9/21: No evidence of ICA stenosis bilaterally; right vertebral artery stenosis   Cataract    Chronic combined systolic (congestive) and diastolic (congestive) heart failure ([place])    Coronary atherosclerosis of unspecified type of vessel, native or graft    a. MI 1985 with unclear details. b. CABG 03/2019 with LIMA -> LAD, SVG -> OM, SVG to dRCA.   Depression    PTSD    Diverticulosis of colon (without mention of hemorrhage)    Esophageal reflux    Esophageal stricture    Essential hypertension    Former tobacco use    Hemorrhoids    Hyperlipidemia    ICD (implantable cardioverter-defibrillator) in place    Irritable bowel syndrome    Ischemic cardiomyopathy    Mild carotid artery disease ([place])    a. 1-39% bilaterally 03/2019 duplex.   Myocardial infarction (HCC)    Nocturia    Obesity, unspecified    Orthostasis    Other acquired absence of organ    Personal history of colonic polyps    Psychosexual dysfunction with  inhibited sexual excitement    Retroperitoneal bleed    Type II or unspecified type diabetes mellitus without mention of complication, not stated as uncontrolled    VT (ventricular tachycardia) ([place])     Past Surgical History:  Procedure Laterality Date   ADENOIDECTOMY     CARPAL TUNNEL RELEASE     left    CHOLECYSTECTOMY     COLON RESECTION     18 inches   COLON SURGERY  2013   CORONARY ANGIOPLASTY     1985    CORONARY ARTERY BYPASS GRAFT N/A 04/26/2019   Procedure: CORONARY ARTERY BYPASS GRAFTING (CABG) x Three, using left internal mammary artery and  right leg greater saphenous vein harvested endoscopically;  Surgeon: Gaye Pollack, MD;  Location: [facility];  Service: Open Heart Surgery;  Laterality: N/A;   ELBOW BURSA SURGERY     EYE SURGERY     ICD IMPLANT N/A 06/25/2020   Procedure: ICD IMPLANT;  Surgeon: Vickie Epley, MD;  Location: [place] [facility];  Service: Cardiovascular;  Laterality: N/A;   LEFT HEART CATH AND CORS/GRAFTS ANGIOGRAPHY N/A 06/19/2020   Procedure: LEFT HEART CATH AND CORS/GRAFTS ANGIOGRAPHY;  Surgeon: Troy Sine, MD;  Location: [place] [facility];  Service: Cardiovascular;  Laterality: N/A;   POLYPECTOMY     RIGHT/LEFT HEART CATH AND CORONARY ANGIOGRAPHY N/A 04/16/2019   Procedure: RIGHT/LEFT HEART CATH AND CORONARY ANGIOGRAPHY;  Surgeon: Belva Crome, MD;  Location: [place] [facility];  Service: Cardiovascular;  Laterality: N/A;   SHOULDER SURGERY     Rt. Shoulder   TEE WITHOUT CARDIOVERSION N/A 04/26/2019   Procedure: TRANSESOPHAGEAL ECHOCARDIOGRAM (TEE);  Surgeon: Gaye Pollack, MD;  Location: [place];  Service: Open Heart Surgery;  Laterality: N/A;   TONSILLECTOMY     V TACH ABLATION N/A 06/22/2020   Procedure: V TACH ABLATION;  Surgeon: Vickie Epley, MD;  Location: [place] [facility];  Service: Cardiovascular;  Laterality: N/A;    Current Medications: No outpatient medications have been marked as taking for the 07/18/22 encounter (Appointment) with Freada Bergeron, MD.     Allergies:   Doxazosin mesylate, Losartan, Testosterone, Flomax [tamsulosin hcl], Penicillins, Sulfonamide derivatives, and Trulicity [dulaglutide]   Social History   Socioeconomic History   Marital status: Married    Spouse name: Not on file   Number of children: 1   Years of education: 12   Highest education level: High school graduate  Occupational History   Occupation: [employer]  Tobacco Use   Smoking status: Former    Types: Cigarettes    Quit date: 08/30/1983    Years since quitting: 38.9   Smokeless  tobacco: Former   Tobacco comments:    Quit 30 years ago.  Vaping Use   Vaping Use: Never used  Substance and Sexual Activity   Alcohol use: Yes    Comment: rare   Drug use: No   Sexual activity: Yes  Other Topics Concern   Not on file  Social History Narrative   Not on file   Social Determinants of Health   Financial Resource Strain: Low Risk  (09/02/2021)   Overall Financial Resource Strain (CARDIA)    Difficulty of Paying Living Expenses: Not hard at all  Food Insecurity: No Food Insecurity (09/02/2021)   Hunger Vital Sign    Worried About Running Out of Food in the Last Year: Never true    Ran Out of Food in the Last Year: Never true  Transportation Needs: No Transportation Needs (09/02/2021)   PRAPARE - Hydrologist (Medical): No    Lack of Transportation (Non-Medical): No  Physical Activity: Inactive (09/02/2021)   Exercise Vital Sign    Days of Exercise per Week: 0 days    Minutes of Exercise per Session: 0 min  Stress: No Stress Concern Present (09/02/2021)   [place]    Feeling of Stress : Not at all  Social Connections: Moderately Isolated (09/02/2021)   Social Connection and Isolation Panel [NHANES]    Frequency of Communication with Friends and Family: More than three times a week    Frequency of Social Gatherings with Friends and Family: More than three times a week    Attends Religious Services: Never    Marine scientist or Organizations: No    Attends Music therapist: Never    Marital Status: Married     Family History: The patient's family history includes Allergic rhinitis in his father; Coronary artery disease in his mother; Lung cancer in his father. There is no history of Colon cancer, Heart attack, Hypertension, Stroke, Esophageal cancer, Rectal cancer, Stomach cancer, or Pancreatic cancer.  ROS:   Please see the history of present illness.     Review of Systems  Constitutional:  Negative for malaise/fatigue.  HENT:  Positive for hearing loss.   Respiratory:  Negative for sputum production.   Cardiovascular:  Negative for chest pain, palpitations, orthopnea, claudication, leg swelling and PND.  Gastrointestinal:  Negative for nausea and vomiting.  Genitourinary:  Negative for hematuria.  Musculoskeletal:  Positive for falls and myalgias.  Neurological:  Negative for dizziness and loss of consciousness.     EKGs/Labs/Other Studies Reviewed:    The following studies were reviewed today: TTE 07/17/20: IMPRESSIONS   1. Inferior and inferoseptal hypokinesis. Left ventricular ejection  fraction, by estimation, is 45 to 50%. The left ventricle has mildly  decreased function. The left ventricle demonstrates regional wall motion  abnormalities (see scoring diagram/findings   for description). There is mild concentric left ventricular hypertrophy.  Left ventricular diastolic parameters are consistent with Grade II  diastolic dysfunction (pseudonormalization).   2. Right ventricular systolic function is normal. The right ventricular  size is normal.   3. Left atrial size was moderately dilated.   4. The mitral valve is normal in structure. Trivial mitral valve  regurgitation. No evidence of mitral stenosis. Moderate mitral annular  calcification.   5. The aortic valve is calcified. There is mild calcification of the  aortic valve. There is mild thickening of the aortic valve. Aortic valve  regurgitation is not visualized. No aortic stenosis is present.   6. The inferior vena cava is dilated in size with <50% respiratory  variability, suggesting right atrial pressure of 15 mmHg.   LHC 05/2020: Ost LAD lesion is 80% stenosed. Prox LAD to Mid LAD lesion is 90% stenosed. Mid LAD lesion is 95% stenosed. Mid RCA lesion is 85% stenosed. Mid LM to Dist LM lesion is 70% stenosed. Prox Cx to Mid Cx lesion is 85% stenosed. Mid Cx to  Dist Cx lesion is 95% stenosed. Non-stenotic Prox RCA lesion. Prox Graft lesion is 20% stenosed.   Severe native multivessel CAD with 70% distal left main stenosis, 80% ostial LAD stenosis followed by segmental 90% stenoses before and after the first diagonal vessel with 95% stenosis in the midsegment proximal to a septal perforating artery with competitive  filling seen distally; diffuse 80 to 95% circumflex stenoses; ectatic proximal RCA with 80% mid stenoses.   Patent LIMA graft supplying the mid LAD.   Patent SVG supplying the distal circumflex marginal vessel branch.   Patent SVG supplying the distal RCA with mild smooth 20% focal proximal to mid narrowing.   Fairly preserved global LV function with EF estimated 50 to 55%.  There is a suggestion of possible apical hypertrophy with nipple protrusion of the apex.  LVEDP 27mHg.   RECOMMENDATION: The patient's bypass conduits are widely patent.  Further EP evaluation for possible defibrillator or VT ablation.  EKG:  No new tracing  Recent Labs: 04/01/2022: Magnesium 1.7 05/10/2022: ALT 28; BUN 13; Creat 0.84; Hemoglobin 15.2; Platelets 126; Potassium 4.0; Sodium 137; TSH 1.40  Recent Lipid Panel    Component Value Date/Time   CHOL 116 05/10/2022 1105   TRIG 233 (H) 05/10/2022 1105   TRIG 193 (H) 08/08/2006 0924   HDL 61 05/10/2022 1105   CHOLHDL 1.9 05/10/2022 1105   VLDL 40 (H) 12/15/2016 0815   LDLCALC 26 05/10/2022 1105   LDLDIRECT 49.9 08/26/2013 1011          Physical Exam:    VS:  There were no vitals taken for this visit.    Wt Readings from Last 3 Encounters:  06/27/22 216 lb 9.6 oz (98.2 kg)  05/10/22 220 lb 3.2 oz (99.9 kg)  04/22/22 220 lb (99.8 kg)     GEN:  Well nourished, well developed in no acute distress HEENT: Normal NECK: No JVD; No carotid bruits CARDIAC: RRR, no murmurs, rubs, gallops RESPIRATORY:  Clear to auscultation without rales, wheezing or rhonchi  ABDOMEN: Soft, non-tender,  non-distended MUSCULOSKELETAL:  No edema; No deformity  SKIN: Warm and dry NEUROLOGIC:  Alert and oriented x 3 PSYCHIATRIC:  Normal affect   ASSESSMENT:    No diagnosis found.  PLAN:    In order of problems listed above:  #CAD s/p CABG: Patient is s/p CABG with LIMA-LAD, SVG-OM, SVG to dRCA in 03/2019. Most recent cath in 08/2019 with patent grafts. Currently doing well without anginal symptoms. States he is having some fatigue with coreg, will trial slightly lower dose and monitor response -Continue ASA '81mg'$  daily -Continue crestor '40mg'$  daily -Continue losartan '25mg'$  daily -Trialing coreg 6.'25mg'$  in AM and 12.'5mg'$  in PM and monitor response (assessing to see if fatigue improves)  #Chronic Combined Systolic and Diastolic HF: LVEF improved to 45-50% on TTE 05/2020 from nadir 25-30% in 2020. Doing well and compensated on exam. NYHA class I-II symptoms. -Continue losartan '25mg'$  daily -Trialing coreg 6.'25mg'$  in AM and 12.'5mg'$  in PM and monitor response (assessing to see if fatigue improves) -Continue jardiance '25mg'$  daily -Low Na diet; monitoring weights  #VT s/p ablation: Underwent VT ablation and ICD implantation with Dr. LQuentin Ore Doing well.  -Follow-up with EP as scheduled -Continue coreg 12.'5mg'$  BID  #HTN: Well controlled and at goal <130/90. -Continue losartan '25mg'$  daily -Trialing coreg 6.'25mg'$  in AM and 12.'5mg'$  in PM and monitor response (assessing to see if fatigue improves)      Medication Adjustments/Labs and Tests Ordered: Current medicines are reviewed at length with the patient today.  Concerns regarding medicines are outlined above.  No orders of the defined types were placed in this encounter.  No orders of the defined types were placed in this encounter.   There are no Patient Instructions on file for this visit.    Signed, HFreada Bergeron MD  07/16/2022 2:33  PM    [facility]

## 2022-07-18 ENCOUNTER — Encounter: Payer: Self-pay | Admitting: Cardiology

## 2022-07-18 ENCOUNTER — Ambulatory Visit: Payer: Medicare HMO | Attending: Cardiology | Admitting: Cardiology

## 2022-07-18 VITALS — BP 114/62 | HR 64 | Ht 71.0 in | Wt 214.0 lb

## 2022-07-18 DIAGNOSIS — I472 Ventricular tachycardia, unspecified: Secondary | ICD-10-CM | POA: Diagnosis not present

## 2022-07-18 DIAGNOSIS — R011 Cardiac murmur, unspecified: Secondary | ICD-10-CM | POA: Diagnosis not present

## 2022-07-18 DIAGNOSIS — I10 Essential (primary) hypertension: Secondary | ICD-10-CM

## 2022-07-18 DIAGNOSIS — I5042 Chronic combined systolic (congestive) and diastolic (congestive) heart failure: Secondary | ICD-10-CM | POA: Diagnosis not present

## 2022-07-18 DIAGNOSIS — E782 Mixed hyperlipidemia: Secondary | ICD-10-CM

## 2022-07-18 DIAGNOSIS — I5022 Chronic systolic (congestive) heart failure: Secondary | ICD-10-CM | POA: Diagnosis not present

## 2022-07-18 DIAGNOSIS — I2510 Atherosclerotic heart disease of native coronary artery without angina pectoris: Secondary | ICD-10-CM

## 2022-07-18 NOTE — Progress Notes (Signed)
Cardiology Office Note:    Date:  07/18/2022   ID:  Cameron, [name] Daniel, Cameron Daniel, MRN [id number]  PCP:  [name], MD   [facility] Providers Cardiologist:  Freada Bergeron, MD Electrophysiologist:  Vickie Epley, MD  Advanced Heart Failure:  Glori Bickers, MD  {   Referring MD: [name], MD    History of Present Illness:    Cameron Daniel is a 76 y.o. male with a hx of CAD s/p CABG with LIMA-LAD, SVG-OM, SVG-dRCA, systolic HF with LVEF 20-25%, VT s/p ablation 05/2020 with ICD implantation, HTN, and HLD who was previously followed by Dr. Meda Coffee who now returns to clinic for follow-up.  Per review of the record, the patient had MI in 1985 with unclear intervention, treated by [name] at that time. Aortoiliac duplex 2014 showed no AAA. He did well for many years before presenting back to the ED August 2020 with fever and malaise. He was seen in the ER and given fluids and discharged but returned back with respiratory failure requiring intubation for PNA and CHF and NSTEMI. 2D echo 04/13/19 showed EF 25-30%, severe hypokinesis of the left ventricular, entire inferior wall and anteroseptal wall, mild LAE, small pericardial effusion. Covid testing was negative. Cath showed 3V CAD with low output. He was started on milrinone post-cath. He developed a large spontaneous RP bleed post cath (cath all done from arm). He ultimately underwent CABG on 04/26/19. Pre-op TEE EF 45-50%. Post CABG course was fortunately uneventful. Spironolactone was later stopped due to hyperkalemia, olmestartan stopped due to hypotension, and he followed with CHF team for a period of time before graduating from their services. He has since tolerated losartan OK. In 05/2020 he was admitted with persistent VT. He was initially treated with amiodarone but ultimately cardioverted in ED. LHC showed patent grafts without specific culprit. He was seen by EP and underwent EPS/ablation with scar  modification and ICD implantation. 2D Echo 05/2020 showed EF 45-50%, mild LVH, grade 2 DD, moderate LAE, normal RV.  Last seen in 09/2021 where he was doing well from a CV standpoint.  Today, the patient states that he feels good aside from feeling sluggish. He believes this is due to his carvedilol medication, which has been recently doubled in dosage. He states that his "energy is gone."  In the clinic today, his BP is 114/62. At home, his BP has averaged 115/60s. His HR has been in the 60s/70s, and in the 90s during activity.   In the past 3-4 months, he's been unable to walk due to knee pain. This was caused by a recent injury to his right knee. He is just recently returning to his baseline activity.  He denies any palpitations or chest pain. No lightheadedness, headaches, syncope, orthopnea, or PND.   Past Medical History:  Diagnosis Date   Allergic rhinitis, cause unspecified    Allergy    Anxiety    Arthritis    Cancer ([place])    a couple places removed from eyelid   Carotid Doppler 04/2020   Carotid US 9/21: No evidence of ICA stenosis bilaterally; right vertebral artery stenosis   Cataract    Chronic combined systolic (congestive) and diastolic (congestive) heart failure (HCC)    Coronary atherosclerosis of unspecified type of vessel, native or graft    a. MI 1985 with unclear details. b. CABG 03/2019 with LIMA -> LAD, SVG -> OM, SVG to dRCA.   Depression    PTSD  Diverticulosis of colon (without mention of hemorrhage)    Esophageal reflux    Esophageal stricture    Essential hypertension    Former tobacco use    Hemorrhoids    Hyperlipidemia    ICD (implantable cardioverter-defibrillator) in place    Irritable bowel syndrome    Ischemic cardiomyopathy    Mild carotid artery disease (HCC)    a. 1-39% bilaterally 03/2019 duplex.   Myocardial infarction (HCC)    Nocturia    Obesity, unspecified    Orthostasis    Other acquired absence of organ    Personal history of  colonic polyps    Psychosexual dysfunction with inhibited sexual excitement    Retroperitoneal bleed    Type II or unspecified type diabetes mellitus without mention of complication, not stated as uncontrolled    VT (ventricular tachycardia) ([place])     Past Surgical History:  Procedure Laterality Date   ADENOIDECTOMY     CARPAL TUNNEL RELEASE     left    CHOLECYSTECTOMY     COLON RESECTION     18 inches   COLON SURGERY  2013   CORONARY ANGIOPLASTY     1985    CORONARY ARTERY BYPASS GRAFT N/A 04/26/2019   Procedure: CORONARY ARTERY BYPASS GRAFTING (CABG) x Three, using left internal mammary artery and right leg greater saphenous vein harvested endoscopically;  Surgeon: Gaye Pollack, MD;  Location: [place];  Service: Open Heart Surgery;  Laterality: N/A;   ELBOW BURSA SURGERY     EYE SURGERY     ICD IMPLANT N/A 06/25/2020   Procedure: ICD IMPLANT;  Surgeon: Vickie Epley, MD;  Location: [place] [facility];  Service: Cardiovascular;  Laterality: N/A;   LEFT HEART CATH AND CORS/GRAFTS ANGIOGRAPHY N/A 06/19/2020   Procedure: LEFT HEART CATH AND CORS/GRAFTS ANGIOGRAPHY;  Surgeon: Troy Sine, MD;  Location: [place] [facility];  Service: Cardiovascular;  Laterality: N/A;   POLYPECTOMY     RIGHT/LEFT HEART CATH AND CORONARY ANGIOGRAPHY N/A 04/16/2019   Procedure: RIGHT/LEFT HEART CATH AND CORONARY ANGIOGRAPHY;  Surgeon: Belva Crome, MD;  Location: [place] [facility];  Service: Cardiovascular;  Laterality: N/A;   SHOULDER SURGERY     Rt. Shoulder   TEE WITHOUT CARDIOVERSION N/A 04/26/2019   Procedure: TRANSESOPHAGEAL ECHOCARDIOGRAM (TEE);  Surgeon: Gaye Pollack, MD;  Location: [place];  Service: Open Heart Surgery;  Laterality: N/A;   TONSILLECTOMY     V TACH ABLATION N/A 06/22/2020   Procedure: V TACH ABLATION;  Surgeon: Vickie Epley, MD;  Location: [place] [facility];  Service: Cardiovascular;  Laterality: N/A;    Current Medications: Current Meds  Medication Sig    acetaminophen (TYLENOL) 500 MG tablet Take 1,000 mg by mouth at bedtime as needed for mild pain.   aspirin EC 81 MG tablet Take 1 tablet (81 mg total) by mouth daily.   azelastine (ASTELIN) 0.1 % nasal spray Place 2 sprays into both nostrils 2 (two) times daily. Use in each nostril as directed   carvedilol (COREG) 12.5 MG tablet Take 1 tablet (12.5 mg total) by mouth 2 (two) times daily with a meal.   Cetirizine HCl (ZERVIATE) 0.24 % SOLN Apply 1 drop to eye 2 (two) times daily.   citalopram (CELEXA) 10 MG tablet TAKE 1 TABLET BY MOUTH EVERY DAY   colestipol (COLESTID) 5 g packet Take 5 g by mouth daily.    diphenoxylate-atropine (LOMOTIL) 2.5-0.025 MG tablet Take 2 tablets by mouth 4 (  four) times daily as needed for diarrhea or loose stools.   famotidine (PEPCID) 20 MG tablet Take 1 tablet (20 mg total) by mouth at bedtime.   fexofenadine (ALLEGRA) 180 MG tablet Take 1 tablet (180 mg total) by mouth daily as needed. For allergies   finasteride (PROSCAR) 5 MG tablet TAKE 1 TABLET BY MOUTH EVERY DAY   JANUVIA 50 MG tablet TAKE 1 TABLET BY MOUTH EVERY DAY   JARDIANCE 25 MG TABS tablet TAKE 25 MG (1 TABLET) BY MOUTH DAILY BEFORE BREAKFAST. STOP PIOGLITAZONE   losartan (COZAAR) 25 MG tablet Take 1 tablet (25 mg total) by mouth at bedtime.   loteprednol (LOTEMAX) 0.5 % ophthalmic suspension Place 1 drop into both eyes 4 (four) times daily as needed (itching).   magnesium oxide (MAG-OX) 400 MG tablet Take 1 tablet (400 mg total) by mouth daily.   montelukast (SINGULAIR) 10 MG tablet TAKE 1 TABLET BY MOUTH EVERYDAY AT BEDTIME   nystatin cream (MYCOSTATIN) Apply 1 Application topically 3 (three) times daily.   pantoprazole (PROTONIX) 40 MG tablet TAKE 1 TABLET BY MOUTH EVERY DAY IN THE MORNING   rosuvastatin (CRESTOR) 40 MG tablet TAKE 1 TABLET BY MOUTH DAILY AT 6 PM.   vitamin B-12 (CYANOCOBALAMIN) 100 MCG tablet Take 100 mcg by mouth daily.     Allergies:   Doxazosin mesylate, Losartan,  Testosterone, Flomax [tamsulosin hcl], Penicillins, Sulfonamide derivatives, and Trulicity [dulaglutide]   Social History   Socioeconomic History   Marital status: Married    Spouse name: Not on file   Number of children: 1   Years of education: 12   Highest education level: High school graduate  Occupational History   Occupation: [employer]  Tobacco Use   Smoking status: Former    Types: Cigarettes    Quit date: 08/30/1983    Years since quitting: 38.9   Smokeless tobacco: Former   Tobacco comments:    Quit 30 years ago.  Vaping Use   Vaping Use: Never used  Substance and Sexual Activity   Alcohol use: Yes    Comment: rare   Drug use: No   Sexual activity: Yes  Other Topics Concern   Not on file  Social History Narrative   Not on file   Social Determinants of Health   Financial Resource Strain: Low Risk  (09/02/2021)   Overall Financial Resource Strain (CARDIA)    Difficulty of Paying Living Expenses: Not hard at all  Food Insecurity: No Food Insecurity (09/02/2021)   Hunger Vital Sign    Worried About Running Out of Food in the Last Year: Never true    Ran Out of Food in the Last Year: Never true  Transportation Needs: No Transportation Needs (09/02/2021)   PRAPARE - Hydrologist (Medical): No    Lack of Transportation (Non-Medical): No  Physical Activity: Inactive (09/02/2021)   Exercise Vital Sign    Days of Exercise per Week: 0 days    Minutes of Exercise per Session: 0 min  Stress: No Stress Concern Present (09/02/2021)   [place]    Feeling of Stress : Not at all  Social Connections: Moderately Isolated (09/02/2021)   Social Connection and Isolation Panel [NHANES]    Frequency of Communication with Friends and Family: More than three times a week    Frequency of Social Gatherings with Friends and Family: More than three times a week    Attends Religious Services: Never  Active Member of Clubs or Organizations: No    Attends Archivist Meetings: Never    Marital Status: Married     Family History: The patient's family history includes Allergic rhinitis in his father; Coronary artery disease in his mother; Lung cancer in his father. There is no history of Colon cancer, Heart attack, Hypertension, Stroke, Esophageal cancer, Rectal cancer, Stomach cancer, or Pancreatic cancer.  ROS:   Please see the history of present illness.    Review of Systems  Constitutional:  Positive for malaise/fatigue.  HENT:  Positive for hearing loss.   Respiratory:  Negative for sputum production.   Cardiovascular:  Positive for leg swelling (RLE). Negative for chest pain, palpitations, orthopnea, claudication and PND.  Gastrointestinal:  Negative for nausea and vomiting.  Genitourinary:  Negative for hematuria.  Musculoskeletal:  Positive for joint pain (Right knee) and myalgias.  Neurological:  Negative for dizziness and loss of consciousness.     EKGs/Labs/Other Studies Reviewed:    The following studies were reviewed today:  ICD Implant 06/25/2020:  CONCLUSIONS:  1. Ischemic cardiomyopathy with chronic New York Heart Association class II heart failure and sustained ventricular tachycardia. 2. Successful ICD implantation with a Medtronic ICD implanted for secondary prevention of sudden death.  3. No early apparent complications.   V Tach Ablation 06/22/2020:  CONCLUSIONS: 1. Sinus rhythm upon presentation  2. Clinical VT noninducible. 3. Nonclinical VT induced and was not hemodynamically tolerated (positive precordial concordance, inferior axis, CL 374m) 4. Successful scar modification along the mid inferior wall extending to the lateral wall 5. A large inferior and inferolateral LV scar on ICE and 3d electroanatomic mapping. 6. No early apparent complications. 7. ICD discussion with the patient and family   TTE 06/20/20: IMPRESSIONS   1. Inferior and  inferoseptal hypokinesis. Left ventricular ejection  fraction, by estimation, is 45 to 50%. The left ventricle has mildly  decreased function. The left ventricle demonstrates regional wall motion  abnormalities (see scoring diagram/findings   for description). There is mild concentric left ventricular hypertrophy.  Left ventricular diastolic parameters are consistent with Grade II  diastolic dysfunction (pseudonormalization).   2. Right ventricular systolic function is normal. The right ventricular  size is normal.   3. Left atrial size was moderately dilated.   4. The mitral valve is normal in structure. Trivial mitral valve  regurgitation. No evidence of mitral stenosis. Moderate mitral annular  calcification.   5. The aortic valve is calcified. There is mild calcification of the  aortic valve. There is mild thickening of the aortic valve. Aortic valve  regurgitation is not visualized. No aortic stenosis is present.   6. The inferior vena cava is dilated in size with <50% respiratory  variability, suggesting right atrial pressure of 15 mmHg.   LHC 05/2020: Ost LAD lesion is 80% stenosed. Prox LAD to Mid LAD lesion is 90% stenosed. Mid LAD lesion is 95% stenosed. Mid RCA lesion is 85% stenosed. Mid LM to Dist LM lesion is 70% stenosed. Prox Cx to Mid Cx lesion is 85% stenosed. Mid Cx to Dist Cx lesion is 95% stenosed. Non-stenotic Prox RCA lesion. Prox Graft lesion is 20% stenosed.   Severe native multivessel CAD with 70% distal left main stenosis, 80% ostial LAD stenosis followed by segmental 90% stenoses before and after the first diagonal vessel with 95% stenosis in the midsegment proximal to a septal perforating artery with competitive filling seen distally; diffuse 80 to 95% circumflex stenoses; ectatic proximal RCA with 80% mid  stenoses.   Patent LIMA graft supplying the mid LAD.   Patent SVG supplying the distal circumflex marginal vessel branch.   Patent SVG supplying the  distal RCA with mild smooth 20% focal proximal to mid narrowing.   Fairly preserved global LV function with EF estimated 50 to 55%.  There is a suggestion of possible apical hypertrophy with nipple protrusion of the apex.  LVEDP 43mHg.   RECOMMENDATION: The patient's bypass conduits are widely patent.  Further EP evaluation for possible defibrillator or VT ablation.  EKG: EKG is personally reviewed. 07/18/2022: EKG was not ordered. 04/13/2022: NSR at 68 bpm, RBBB 154 ms ([place]  Recent Labs: 04/01/2022: Magnesium 1.7 05/10/2022: ALT 28; BUN 13; Creat 0.84; Hemoglobin 15.2; Platelets 126; Potassium 4.0; Sodium 137; TSH 1.40  Recent Lipid Panel    Component Value Date/Time   CHOL 116 05/10/2022 1105   TRIG 233 (H) 05/10/2022 1105   TRIG 193 (H) 08/08/2006 0924   HDL 61 05/10/2022 1105   CHOLHDL 1.9 05/10/2022 1105   VLDL 40 (H) 12/15/2016 0815   LDLCALC 26 05/10/2022 1105   LDLDIRECT 49.9 08/26/2013 1011          Physical Exam:    VS:  BP 114/62   Pulse 64   Ht '5\' 11"'$  (1.803 m)   Wt 214 lb (97.1 kg)   SpO2 96%   BMI 29.85 kg/m     Wt Readings from Last 3 Encounters:  07/18/22 214 lb (97.1 kg)  06/27/22 216 lb 9.6 oz (98.2 kg)  05/10/22 220 lb 3.2 oz (99.9 kg)     GEN:  Well nourished, well developed in no acute distress HEENT: Normal NECK: No JVD; No carotid bruits CARDIAC: RRR, 2/6 systolic murmur, No rubs, no gallops RESPIRATORY:  Clear to auscultation without rales, wheezing or rhonchi  ABDOMEN: Soft, non-tender, non-distended MUSCULOSKELETAL:  Warm , no edema SKIN: Warm and dry NEUROLOGIC:  Alert and oriented x 3 PSYCHIATRIC:  Normal affect   ASSESSMENT:    1. Coronary artery disease involving native coronary artery of native heart without angina pectoris   2. CAD in native artery   3. Chronic systolic heart failure ([place]   4. Essential hypertension   5. Chronic combined systolic and diastolic CHF (congestive heart failure) ([place]   6.  Systolic murmur   7. Mixed hyperlipidemia   8. VT (ventricular tachycardia) (HCC)    PLAN:    In order of problems listed above:  #CAD s/p CABG: Patient is s/p CABG with LIMA-LAD, SVG-OM, SVG to dRCA in 03/2019. Most recent cath in 08/2019 with patent grafts. Currently doing well without anginal symptoms. Believes coreg is causing fatigue. Will trial slightly lower dose and monitor response. -Continue ASA '81mg'$  daily -Continue crestor '40mg'$  daily -Continue losartan '25mg'$  daily -Decrease coreg to 6.'25mg'$  in AM and 12.'5mg'$  in PM due to fatigue  #Chronic Combined Systolic and Diastolic HF: LVEF improved to 45-50% on TTE 05/2020 from nadir 25-30% in 2020. Doing well and compensated on exam. NYHA class I-II symptoms. -Repeat TTE for monitoring -Continue losartan '25mg'$  daily -Decrease coreg to 6.'25mg'$  in AM and 12.'5mg'$  in PM due to fatigue -Continue jardiance '10mg'$  daily -Low Na diet; monitoring weights  #VT s/p ablation: Underwent VT ablation and ICD implantation with Dr. LQuentin Ore Doing well.  -Follow-up with EP as scheduled -Decrease coreg to 6.'25mg'$  in AM and 12.'5mg'$  in PM due to fatigue  #HTN: -Continue losartan '25mg'$  daily -Decrease coreg to 6.'25mg'$  in AM and 12.'5mg'$  in  PM due to fatigue  #Systolic Murmur: -Check TTE    Follow up: 6 months.  Medication Adjustments/Labs and Tests Ordered: Current medicines are reviewed at length with the patient today.  Concerns regarding medicines are outlined above.   Orders Placed This Encounter  Procedures   ECHOCARDIOGRAM COMPLETE   No orders of the defined types were placed in this encounter.  Patient Instructions  Medication Instructions:   Your physician recommends that you continue on your current medications as directed. Please refer to the Current Medication list given to you today.  *If you need a refill on your cardiac medications before your next appointment, please call your pharmacy*   Testing/Procedures:  Your physician has  requested that you have an echocardiogram. Echocardiography is a painless test that uses sound waves to create images of your heart. It provides your doctor with information about the size and shape of your heart and how well your heart's chambers and valves are working. This procedure takes approximately one hour. There are no restrictions for this procedure. Please do NOT wear cologne, perfume, aftershave, or lotions (deodorant is allowed). Please arrive 15 minutes prior to your appointment time.    Follow-Up: At [facility], you and your health needs are our priority.  As part of our continuing mission to provide you with exceptional heart care, we have created designated Provider Care Teams.  These Care Teams include your primary Cardiologist (physician) and Advanced Practice Providers (APPs -  Physician Assistants and Nurse Practitioners) who all work together to provide you with the care you need, when you need it.  We recommend signing up for the patient portal called "MyChart".  Sign up information is provided on this After Visit Summary.  MyChart is used to connect with patients for Virtual Visits (Telemedicine).  Patients are able to view lab/test results, encounter notes, upcoming appointments, etc.  Non-urgent messages can be sent to your provider as well.   To learn more about what you can do with MyChart, go to [email or web address].    Your next appointment:   6 month(s)  The format for your next appointment:   In Person  Provider:   Freada Bergeron, MD     Important Information About [place] as a scribe for Freada Bergeron, MD.,have documented all relevant documentation on the behalf of Freada Bergeron, MD,as directed by  Freada Bergeron, MD while in the presence of Freada Bergeron, MD.   I, Freada Bergeron, MD, have reviewed all documentation for this visit. The documentation on 07/18/22 for the exam,  diagnosis, procedures, and orders are all accurate and complete.   Signed, Freada Bergeron, MD  07/18/2022 12:26 PM    [place]

## 2022-07-18 NOTE — Patient Instructions (Signed)
Medication Instructions:   Your physician recommends that you continue on your current medications as directed. Please refer to the Current Medication list given to you today.  *If you need a refill on your cardiac medications before your next appointment, please call your pharmacy*   Testing/Procedures:  Your physician has requested that you have an echocardiogram. Echocardiography is a painless test that uses sound waves to create images of your heart. It provides your doctor with information about the size and shape of your heart and how well your heart's chambers and valves are working. This procedure takes approximately one hour. There are no restrictions for this procedure. Please do NOT wear cologne, perfume, aftershave, or lotions (deodorant is allowed). Please arrive 15 minutes prior to your appointment time.    Follow-Up: At [facility], you and your health needs are our priority.  As part of our continuing mission to provide you with exceptional heart care, we have created designated Provider Care Teams.  These Care Teams include your primary Cardiologist (physician) and Advanced Practice Providers (APPs -  Physician Assistants and Nurse Practitioners) who all work together to provide you with the care you need, when you need it.  We recommend signing up for the patient portal called "MyChart".  Sign up information is provided on this After Visit Summary.  MyChart is used to connect with patients for Virtual Visits (Telemedicine).  Patients are able to view lab/test results, encounter notes, upcoming appointments, etc.  Non-urgent messages can be sent to your provider as well.   To learn more about what you can do with MyChart, go to [email or web address].    Your next appointment:   6 month(s)  The format for your next appointment:   In Person  Provider:   Heather E Pemberton, MD     Important Information About Sugar       

## 2022-07-25 ENCOUNTER — Encounter: Payer: Self-pay | Admitting: Family Medicine

## 2022-07-25 ENCOUNTER — Ambulatory Visit (INDEPENDENT_AMBULATORY_CARE_PROVIDER_SITE_OTHER): Payer: Medicare HMO | Admitting: Family Medicine

## 2022-07-25 VITALS — BP 118/58 | HR 67 | Temp 97.6°F | Ht 71.0 in | Wt 213.0 lb

## 2022-07-25 DIAGNOSIS — K219 Gastro-esophageal reflux disease without esophagitis: Secondary | ICD-10-CM

## 2022-07-25 MED ORDER — PANTOPRAZOLE SODIUM 40 MG PO TBEC: 40.0000 mg | DELAYED_RELEASE_TABLET | Freq: Two times a day (BID) | ORAL | 3 refills | Status: DC

## 2022-07-25 NOTE — Assessment & Plan Note (Addendum)
No red flag symptoms. GERD uncontrolled, will increase Protonix to BID. Discussed avoiding triggers such as high-fat foods, spicy foods, caffeine, nicotine, NSAIDs, and eating large meals. He is seeing gastroenterology in January. Return to office if symptoms persist or worsen.

## 2022-07-25 NOTE — Progress Notes (Signed)
Acute Office Visit  Subjective:     Patient ID: Cameron Daniel, male    DOB: 1945-09-07, 76 y.o.   MRN: [id number]  Chief Complaint  Patient presents with   Follow-up    stomach issues/feeling bloated/no appetite been feeling like this for a few months/ Plus always feel like he is full. BS was 110 B/P 120/60 hr-65    HPI Patient is in today for months of upper abdominal bloating and decreased appetite. He gets full easier and burps frequently. Improved when lying down but is worse in AM. Has tried Align, Pepcid '20mg'$  daily, and Protonix '40mg'$  daily.  Denies vomiting/nausea, weight loss, anorexia, sore throat. Diet consists of non-fatty foods, coffee in AM.   Will see Dr Henrene Pastor with Gastroenterology in January.  Review of Systems  Reason unable to perform ROS: see HPI.  All other systems reviewed and are negative.   Past Surgical History:  Procedure Laterality Date   ADENOIDECTOMY     CARPAL TUNNEL RELEASE     left    CHOLECYSTECTOMY     COLON RESECTION     18 inches   COLON SURGERY  2013   CORONARY ANGIOPLASTY     1985    CORONARY ARTERY BYPASS GRAFT N/A 04/26/2019   Procedure: CORONARY ARTERY BYPASS GRAFTING (CABG) x Three, using left internal mammary artery and right leg greater saphenous vein harvested endoscopically;  Surgeon: Gaye Pollack, MD;  Location: [facility];  Service: Open Heart Surgery;  Laterality: N/A;   ELBOW BURSA SURGERY     EYE SURGERY     ICD IMPLANT N/A 06/25/2020   Procedure: ICD IMPLANT;  Surgeon: Vickie Epley, MD;  Location: [facility];  Service: Cardiovascular;  Laterality: N/A;   LEFT HEART CATH AND CORS/GRAFTS ANGIOGRAPHY N/A 06/19/2020   Procedure: LEFT HEART CATH AND CORS/GRAFTS ANGIOGRAPHY;  Surgeon: [facility] Sine, MD;  Location: [facility];  Service: Cardiovascular;  Laterality: N/A;   POLYPECTOMY     RIGHT/LEFT HEART CATH AND CORONARY ANGIOGRAPHY N/A 04/16/2019   Procedure: RIGHT/LEFT HEART CATH AND CORONARY  ANGIOGRAPHY;  Surgeon: Belva Crome, MD;  Location: [facility];  Service: Cardiovascular;  Laterality: N/A;   SHOULDER SURGERY     Rt. Shoulder   TEE WITHOUT CARDIOVERSION N/A 04/26/2019   Procedure: TRANSESOPHAGEAL ECHOCARDIOGRAM (TEE);  Surgeon: Gaye Pollack, MD;  Location: [facility];  Service: Open Heart Surgery;  Laterality: N/A;   TONSILLECTOMY     V TACH ABLATION N/A 06/22/2020   Procedure: V TACH ABLATION;  Surgeon: Vickie Epley, MD;  Location: [facility];  Service: Cardiovascular;  Laterality: N/A;   Past Medical History:  Diagnosis Date   Allergic rhinitis, cause unspecified    Allergy    Anxiety    Arthritis    Cancer ([place])    a couple places removed from eyelid   Carotid Doppler 04/2020   Carotid US 9/21: No evidence of ICA stenosis bilaterally; right vertebral artery stenosis   Cataract    Chronic combined systolic (congestive) and diastolic (congestive) heart failure (HCC)    Coronary atherosclerosis of unspecified type of vessel, native or graft    a. MI 1985 with unclear details. b. CABG 03/2019 with LIMA -> LAD, SVG -> OM, SVG to dRCA.   Depression    PTSD    Diverticulosis of colon (without mention of hemorrhage)    Esophageal reflux    Esophageal stricture    Essential  hypertension    Former tobacco use    Hemorrhoids    Hyperlipidemia    ICD (implantable cardioverter-defibrillator) in place    Irritable bowel syndrome    Ischemic cardiomyopathy    Mild carotid artery disease ([place])    a. 1-39% bilaterally 03/2019 duplex.   Myocardial infarction (HCC)    Nocturia    Obesity, unspecified    Orthostasis    Other acquired absence of organ    Personal history of colonic polyps    Psychosexual dysfunction with inhibited sexual excitement    Retroperitoneal bleed    Type II or unspecified type diabetes mellitus without mention of complication, not stated as uncontrolled    VT (ventricular tachycardia) (HCC)    Current Outpatient  Medications on File Prior to Visit  Medication Sig Dispense Refill   acetaminophen (TYLENOL) 500 MG tablet Take 1,000 mg by mouth at bedtime as needed for mild pain.     aspirin EC 81 MG tablet Take 1 tablet (81 mg total) by mouth daily. 90 tablet 3   azelastine (ASTELIN) 0.1 % nasal spray Place 2 sprays into both nostrils 2 (two) times daily. Use in each nostril as directed 30 mL 12   carvedilol (COREG) 12.5 MG tablet Take 1 tablet (12.5 mg total) by mouth 2 (two) times daily with a meal. 180 tablet 3   Cetirizine HCl (ZERVIATE) 0.24 % SOLN Apply 1 drop to eye 2 (two) times daily. 30 each 3   citalopram (CELEXA) 10 MG tablet TAKE 1 TABLET BY MOUTH EVERY DAY 90 tablet 1   colestipol (COLESTID) 5 g packet Take 5 g by mouth daily.      diphenoxylate-atropine (LOMOTIL) 2.5-0.025 MG tablet Take 2 tablets by mouth 4 (four) times daily as needed for diarrhea or loose stools. 30 tablet 0   famotidine (PEPCID) 20 MG tablet Take 1 tablet (20 mg total) by mouth at bedtime. 30 tablet 2   fexofenadine (ALLEGRA) 180 MG tablet Take 1 tablet (180 mg total) by mouth daily as needed. For allergies 90 tablet 0   finasteride (PROSCAR) 5 MG tablet TAKE 1 TABLET BY MOUTH EVERY DAY 90 tablet 3   JANUVIA 50 MG tablet TAKE 1 TABLET BY MOUTH EVERY DAY 90 tablet 0   JARDIANCE 25 MG TABS tablet TAKE 25 MG (1 TABLET) BY MOUTH DAILY BEFORE BREAKFAST. STOP PIOGLITAZONE 90 tablet 1   losartan (COZAAR) 25 MG tablet Take 1 tablet (25 mg total) by mouth at bedtime. 90 tablet 2   loteprednol (LOTEMAX) 0.5 % ophthalmic suspension Place 1 drop into both eyes 4 (four) times daily as needed (itching). 5 mL 1   magnesium oxide (MAG-OX) 400 MG tablet Take 1 tablet (400 mg total) by mouth daily. 90 tablet 3   montelukast (SINGULAIR) 10 MG tablet TAKE 1 TABLET BY MOUTH EVERYDAY AT BEDTIME 90 tablet 3   nystatin cream (MYCOSTATIN) Apply 1 Application topically 3 (three) times daily. 30 g 1   rosuvastatin (CRESTOR) 40 MG tablet TAKE 1  TABLET BY MOUTH DAILY AT 6 PM. 90 tablet 3   vitamin B-12 (CYANOCOBALAMIN) 100 MCG tablet Take 100 mcg by mouth daily.     No current facility-administered medications on file prior to visit.   Allergies  Allergen Reactions   Doxazosin Mesylate Other (See Comments)    Drops in blood pressure    Losartan     Other reaction(s): Muscle pain   Testosterone     Other reaction(s): Erythrocytosis Other reaction(s): Erythrocytosis  Flomax [Tamsulosin Hcl] Itching and Other (See Comments)    Drops in blood pressure     Penicillins Rash and Other (See Comments)    Has patient had a PCN reaction causing immediate rash, facial/tongue/throat swelling, SOB or lightheadedness with hypotension: No Has patient had a PCN reaction causing severe rash involving mucus membranes or skin necrosis: No Has patient had a PCN reaction that required hospitalization No Has patient had a PCN reaction occurring within the last 10 years: No If all of the above answers are "NO", then may proceed with Cephalosporin use.    Sulfonamide Derivatives Rash   Trulicity [Dulaglutide] Nausea And Vomiting    N&V, Dizziness        Objective:    BP (!) 118/58   Pulse 67   Temp 97.6 F (36.4 C) (Oral)   Ht '5\' 11"'$  (1.803 m)   Wt 213 lb (96.6 kg)   SpO2 98%   BMI 29.71 kg/m    Physical Exam Vitals and nursing note reviewed.  Constitutional:      Appearance: Normal appearance. He is normal weight.  HENT:     Head: Normocephalic and atraumatic.     Mouth/Throat:     Mouth: Mucous membranes are moist.     Pharynx: Oropharynx is clear.  Abdominal:     General: Bowel sounds are normal. There is no distension.     Palpations: Abdomen is soft.     Tenderness: There is no abdominal tenderness. There is no guarding or rebound.  Skin:    General: Skin is warm and dry.     Capillary Refill: Capillary refill takes less than 2 seconds.  Neurological:     General: No focal deficit present.     Mental Status: He is  alert and oriented to person, place, and time. Mental status is at baseline.  Psychiatric:        Mood and Affect: Mood normal.        Behavior: Behavior normal.        Thought Content: Thought content normal.        Judgment: Judgment normal.     No results found for any visits on 07/25/22.      Assessment & Plan:   Problem List Items Addressed This Visit       Digestive   GERD - Primary    No red flag symptoms. GERD uncontrolled, will increase Protonix to BID. Discussed avoiding triggers such as high-fat foods, spicy foods, caffeine, nicotine, and eating large meals. He is seeing gastroenterology in January. Return to office if symptoms persist or worsen.      Relevant Medications   pantoprazole (PROTONIX) 40 MG tablet    Meds ordered this encounter  Medications   pantoprazole (PROTONIX) 40 MG tablet    Sig: Take 1 tablet (40 mg total) by mouth 2 (two) times daily. TAKE 1 TABLET BY MOUTH EVERY DAY IN THE MORNING    Dispense:  90 tablet    Refill:  3    Order Specific Question:   Supervising Provider    Answer:   Jenna Luo T [[id number]]    Return if symptoms worsen or fail to improve.  Rubie Maid, FNP

## 2022-08-02 ENCOUNTER — Ambulatory Visit (INDEPENDENT_AMBULATORY_CARE_PROVIDER_SITE_OTHER): Payer: Medicare HMO | Admitting: Family Medicine

## 2022-08-02 ENCOUNTER — Ambulatory Visit
Admission: RE | Admit: 2022-08-02 | Discharge: 2022-08-02 | Disposition: A | Discharge status: Home / Self Care | Payer: Medicare HMO | Source: Ambulatory Visit | Attending: Family Medicine | Admitting: Family Medicine

## 2022-08-02 ENCOUNTER — Other Ambulatory Visit: Payer: Self-pay

## 2022-08-02 VITALS — BP 116/68 | HR 65 | Ht 71.0 in | Wt 212.0 lb

## 2022-08-02 DIAGNOSIS — R6881 Early satiety: Secondary | ICD-10-CM

## 2022-08-02 DIAGNOSIS — K219 Gastro-esophageal reflux disease without esophagitis: Secondary | ICD-10-CM

## 2022-08-02 DIAGNOSIS — R140 Abdominal distension (gaseous): Secondary | ICD-10-CM | POA: Diagnosis not present

## 2022-08-02 DIAGNOSIS — Z951 Presence of aortocoronary bypass graft: Secondary | ICD-10-CM | POA: Diagnosis not present

## 2022-08-02 DIAGNOSIS — Z95810 Presence of automatic (implantable) cardiac defibrillator: Secondary | ICD-10-CM | POA: Diagnosis not present

## 2022-08-02 MED ORDER — PANTOPRAZOLE SODIUM 40 MG PO TBEC: 40.0000 mg | DELAYED_RELEASE_TABLET | Freq: Two times a day (BID) | ORAL | 3 refills | Status: DC

## 2022-08-02 MED ORDER — METOCLOPRAMIDE HCL 10 MG PO TABS: 10.0000 mg | ORAL_TABLET | Freq: Three times a day (TID) | ORAL | 0 refills | Status: DC

## 2022-08-02 NOTE — Progress Notes (Signed)
Subjective:    Patient ID: Cameron Daniel, male    DOB: 1945/12/11, 76 y.o.   MRN: [id number]  HPI Patient is a 76 year old Caucasian gentleman who reports several weeks of bloating and abdominal discomfort.  He states that he is having early satiety.  He states that he will eat very little and suddenly feel extremely ill and start developing.  He has a lot of upper abdominal gas and distention per his report.  He also feels nauseated.  He denies any vomiting.  He denies any hematemesis.  He denies any dysphagia.  He denies any food getting stuck in his throat.  However he states that he feels like he eats 1 or 2 bites and cannot eat anymore due to bloating and distention.  He does occasionally have constipation but he still having bowel movements almost on a daily basis.  He denies any melena or hematochezia.  He denies any diarrhea.  He denies any fevers or chills.  He denies any abdominal pain.  He denies any significant weight loss. Past Medical History:  Diagnosis Date   Allergic rhinitis, cause unspecified    Allergy    Anxiety    Arthritis    Cancer ([place])    a couple places removed from eyelid   Carotid Doppler 04/2020   Carotid US 9/21: No evidence of ICA stenosis bilaterally; right vertebral artery stenosis   Cataract    Chronic combined systolic (congestive) and diastolic (congestive) heart failure (HCC)    Coronary atherosclerosis of unspecified type of vessel, native or graft    a. MI 1985 with unclear details. b. CABG 03/2019 with LIMA -> LAD, SVG -> OM, SVG to dRCA.   Depression    PTSD    Diverticulosis of colon (without mention of hemorrhage)    Esophageal reflux    Esophageal stricture    Essential hypertension    Former tobacco use    Hemorrhoids    Hyperlipidemia    ICD (implantable cardioverter-defibrillator) in place    Irritable bowel syndrome    Ischemic cardiomyopathy    Mild carotid artery disease ([place])    a. 1-39% bilaterally 03/2019 duplex.    Myocardial infarction (HCC)    Nocturia    Obesity, unspecified    Orthostasis    Other acquired absence of organ    Personal history of colonic polyps    Psychosexual dysfunction with inhibited sexual excitement    Retroperitoneal bleed    Type II or unspecified type diabetes mellitus without mention of complication, not stated as uncontrolled    VT (ventricular tachycardia) ([place])    Past Surgical History:  Procedure Laterality Date   ADENOIDECTOMY     CARPAL TUNNEL RELEASE     left    CHOLECYSTECTOMY     COLON RESECTION     18 inches   COLON SURGERY  2013   CORONARY ANGIOPLASTY     1985    CORONARY ARTERY BYPASS GRAFT N/A 04/26/2019   Procedure: CORONARY ARTERY BYPASS GRAFTING (CABG) x Three, using left internal mammary artery and right leg greater saphenous vein harvested endoscopically;  Surgeon: Gaye Pollack, MD;  Location: [facility];  Service: Open Heart Surgery;  Laterality: N/A;   ELBOW BURSA SURGERY     EYE SURGERY     ICD IMPLANT N/A 06/25/2020   Procedure: ICD IMPLANT;  Surgeon: Vickie Epley, MD;  Location: [facility];  Service: Cardiovascular;  Laterality: N/A;   LEFT HEART CATH AND CORS/GRAFTS  ANGIOGRAPHY N/A 06/19/2020   Procedure: LEFT HEART CATH AND CORS/GRAFTS ANGIOGRAPHY;  Surgeon: [facility] Sine, MD;  Location: [facility];  Service: Cardiovascular;  Laterality: N/A;   POLYPECTOMY     RIGHT/LEFT HEART CATH AND CORONARY ANGIOGRAPHY N/A 04/16/2019   Procedure: RIGHT/LEFT HEART CATH AND CORONARY ANGIOGRAPHY;  Surgeon: Belva Crome, MD;  Location: [facility];  Service: Cardiovascular;  Laterality: N/A;   SHOULDER SURGERY     Rt. Shoulder   TEE WITHOUT CARDIOVERSION N/A 04/26/2019   Procedure: TRANSESOPHAGEAL ECHOCARDIOGRAM (TEE);  Surgeon: Gaye Pollack, MD;  Location: [name];  Service: Open Heart Surgery;  Laterality: N/A;   TONSILLECTOMY     V TACH ABLATION N/A 06/22/2020   Procedure: V TACH ABLATION;  Surgeon: Vickie Epley,  MD;  Location: [facility];  Service: Cardiovascular;  Laterality: N/A;   Current Outpatient Medications on File Prior to Visit  Medication Sig Dispense Refill   acetaminophen (TYLENOL) 500 MG tablet Take 1,000 mg by mouth at bedtime as needed for mild pain.     aspirin EC 81 MG tablet Take 1 tablet (81 mg total) by mouth daily. 90 tablet 3   azelastine (ASTELIN) 0.1 % nasal spray Place 2 sprays into both nostrils 2 (two) times daily. Use in each nostril as directed 30 mL 12   carvedilol (COREG) 12.5 MG tablet Take 1 tablet (12.5 mg total) by mouth 2 (two) times daily with a meal. 180 tablet 3   Cetirizine HCl (ZERVIATE) 0.24 % SOLN Apply 1 drop to eye 2 (two) times daily. 30 each 3   citalopram (CELEXA) 10 MG tablet TAKE 1 TABLET BY MOUTH EVERY DAY 90 tablet 1   colestipol (COLESTID) 5 g packet Take 5 g by mouth daily.      diphenoxylate-atropine (LOMOTIL) 2.5-0.025 MG tablet Take 2 tablets by mouth 4 (four) times daily as needed for diarrhea or loose stools. 30 tablet 0   famotidine (PEPCID) 20 MG tablet Take 1 tablet (20 mg total) by mouth at bedtime. 30 tablet 2   fexofenadine (ALLEGRA) 180 MG tablet Take 1 tablet (180 mg total) by mouth daily as needed. For allergies 90 tablet 0   finasteride (PROSCAR) 5 MG tablet TAKE 1 TABLET BY MOUTH EVERY DAY 90 tablet 3   JANUVIA 50 MG tablet TAKE 1 TABLET BY MOUTH EVERY DAY 90 tablet 0   JARDIANCE 25 MG TABS tablet TAKE 25 MG (1 TABLET) BY MOUTH DAILY BEFORE BREAKFAST. STOP PIOGLITAZONE 90 tablet 1   losartan (COZAAR) 25 MG tablet Take 1 tablet (25 mg total) by mouth at bedtime. 90 tablet 2   loteprednol (LOTEMAX) 0.5 % ophthalmic suspension Place 1 drop into both eyes 4 (four) times daily as needed (itching). 5 mL 1   magnesium oxide (MAG-OX) 400 MG tablet Take 1 tablet (400 mg total) by mouth daily. 90 tablet 3   montelukast (SINGULAIR) 10 MG tablet TAKE 1 TABLET BY MOUTH EVERYDAY AT BEDTIME 90 tablet 3   nystatin cream (MYCOSTATIN) Apply 1  Application topically 3 (three) times daily. 30 g 1   rosuvastatin (CRESTOR) 40 MG tablet TAKE 1 TABLET BY MOUTH DAILY AT 6 PM. 90 tablet 3   vitamin B-12 (CYANOCOBALAMIN) 100 MCG tablet Take 100 mcg by mouth daily.     No current facility-administered medications on file prior to visit.   Allergies  Allergen Reactions   Doxazosin Mesylate Other (See Comments)    Drops in blood pressure  Losartan     Other reaction(s): Muscle pain   Testosterone     Other reaction(s): Erythrocytosis Other reaction(s): Erythrocytosis   Flomax [Tamsulosin Hcl] Itching and Other (See Comments)    Drops in blood pressure     Penicillins Rash and Other (See Comments)    Has patient had a PCN reaction causing immediate rash, facial/tongue/throat swelling, SOB or lightheadedness with hypotension: No Has patient had a PCN reaction causing severe rash involving mucus membranes or skin necrosis: No Has patient had a PCN reaction that required hospitalization No Has patient had a PCN reaction occurring within the last 10 years: No If all of the above answers are "NO", then may proceed with Cephalosporin use.    Sulfonamide Derivatives Rash   Trulicity [Dulaglutide] Nausea And Vomiting    N&V, Dizziness   Social History   Socioeconomic History   Marital status: Married    Spouse name: Not on file   Number of children: 1   Years of education: 12   Highest education level: High school graduate  Occupational History   Occupation: [employer]  Tobacco Use   Smoking status: Former    Types: Cigarettes    Quit date: 08/30/1983    Years since quitting: 38.9   Smokeless tobacco: Former   Tobacco comments:    Quit 30 years ago.  Vaping Use   Vaping Use: Never used  Substance and Sexual Activity   Alcohol use: Yes    Comment: rare   Drug use: No   Sexual activity: Yes  Other Topics Concern   Not on file  Social History Narrative   Not on file   Social Determinants of Health   Financial Resource  Strain: Low Risk  (09/02/2021)   Overall Financial Resource Strain (CARDIA)    Difficulty of Paying Living Expenses: Not hard at all  Food Insecurity: No Food Insecurity (09/02/2021)   Hunger Vital Sign    Worried About Running Out of Food in the Last Year: Never true    Ran Out of Food in the Last Year: Never true  Transportation Needs: No Transportation Needs (09/02/2021)   PRAPARE - Hydrologist (Medical): No    Lack of Transportation (Non-Medical): No  Physical Activity: Inactive (09/02/2021)   Exercise Vital Sign    Days of Exercise per Week: 0 days    Minutes of Exercise per Session: 0 min  Stress: No Stress Concern Present (09/02/2021)   [place]    Feeling of Stress : Not at all  Social Connections: Moderately Isolated (09/02/2021)   Social Connection and Isolation Panel [NHANES]    Frequency of Communication with Friends and Family: More than three times a week    Frequency of Social Gatherings with Friends and Family: More than three times a week    Attends Religious Services: Never    Marine scientist or Organizations: No    Attends Archivist Meetings: Never    Marital Status: Married  Human resources officer Violence: Not At Risk (09/02/2021)   Humiliation, Afraid, Rape, and Kick questionnaire    Fear of Current or Ex-Partner: No    Emotionally Abused: No    Physically Abused: No    Sexually Abused: No     Review of Systems     Objective:   Physical Exam Vitals reviewed.  Constitutional:      General: He is not in acute distress.  Appearance: Normal appearance. He is obese. He is not ill-appearing or toxic-appearing.  HENT:     Right Ear: Tympanic membrane and ear canal normal.     Left Ear: Tympanic membrane and ear canal normal.     Nose: Nose normal.     Mouth/Throat:     Mouth: Mucous membranes are moist.  Eyes:     General: Lids are normal.     Extraocular  Movements:     Right eye: Normal extraocular motion.     Left eye: Normal extraocular motion.     Conjunctiva/sclera:     Left eye: Left conjunctiva is not injected.  Cardiovascular:     Rate and Rhythm: Normal rate and regular rhythm.     Pulses: Normal pulses.     Heart sounds: Normal heart sounds. No murmur heard.    No friction rub. No gallop.  Pulmonary:     Effort: Pulmonary effort is normal. No respiratory distress.     Breath sounds: Normal breath sounds. No stridor. No wheezing, rhonchi or rales.  Abdominal:     General: Abdomen is flat. Bowel sounds are normal. There is no distension.     Palpations: Abdomen is soft. There is no mass.     Tenderness: There is no abdominal tenderness. There is no guarding or rebound.     Hernia: No hernia is present.  Musculoskeletal:     Right lower leg: No edema.     Left lower leg: No edema.     Right foot: Normal range of motion and normal capillary refill. Tenderness present. No swelling, prominent metatarsal heads or crepitus.     Left foot: Normal range of motion and normal capillary refill. No swelling, tenderness or crepitus.  Skin:    Findings: No rash.  Neurological:     General: No focal deficit present.     Mental Status: He is alert and oriented to person, place, and time. Mental status is at baseline.  Psychiatric:        Mood and Affect: Mood normal.        Behavior: Behavior normal.        Thought Content: Thought content normal.        Judgment: Judgment normal.           Assessment & Plan:  Bloating - Plan: DG Abd Acute W/Chest  Early satiety  Abdominal distention Differential diagnosis is broad.  First am concerned about possible gastroparesis given his longstanding history of diabetes as well as his neuropathy in his feet.  Therefore we will try the patient empirically on Reglan 10 mg 3 times a day for a week and see if that helps.  He is already on twice daily Protonix and Pepcid so I do not feel that this  is acid reflux.  I would be concerned about gastric outlet obstruction.  So if symptoms or not improving, consider a GI referral for EGD.  Patient has recently had a colonoscopy.  However he would be at risk for possible constipation causing bloating and abdominal distention so I will send the patient for abdominal x-ray to evaluate for an elevated stool burden and if present, we will try to facilitate with a better stool regimen.

## 2022-08-03 ENCOUNTER — Telehealth: Payer: Self-pay

## 2022-08-03 NOTE — Telephone Encounter (Signed)
[name], MD  Chriss Driver, LPN  Large amount of stool in abd, recommend adding miralax daily for 4 days before starting reglan to see if it helps.  If no better on miralax, add reglan.

## 2022-08-08 ENCOUNTER — Telehealth: Payer: Self-pay | Admitting: Family Medicine

## 2022-08-08 NOTE — Telephone Encounter (Signed)
Patient requestinga different prescription other than metoCLOPramide (REGLAN) 10 MG tablet  Stated he doesn't want to take it because it has too many side effects.  Requesting call back; ok to leave a message on his voicemail if he doesn't answer.

## 2022-08-10 ENCOUNTER — Ambulatory Visit (HOSPITAL_COMMUNITY): Payer: Medicare HMO | Attending: Cardiology

## 2022-08-10 DIAGNOSIS — Z951 Presence of aortocoronary bypass graft: Secondary | ICD-10-CM | POA: Diagnosis not present

## 2022-08-10 DIAGNOSIS — I2510 Atherosclerotic heart disease of native coronary artery without angina pectoris: Secondary | ICD-10-CM | POA: Insufficient documentation

## 2022-08-10 DIAGNOSIS — I509 Heart failure, unspecified: Secondary | ICD-10-CM | POA: Diagnosis not present

## 2022-08-10 DIAGNOSIS — R011 Cardiac murmur, unspecified: Secondary | ICD-10-CM

## 2022-08-10 DIAGNOSIS — E119 Type 2 diabetes mellitus without complications: Secondary | ICD-10-CM | POA: Insufficient documentation

## 2022-08-10 DIAGNOSIS — I3481 Nonrheumatic mitral (valve) annulus calcification: Secondary | ICD-10-CM | POA: Diagnosis not present

## 2022-08-10 DIAGNOSIS — I5022 Chronic systolic (congestive) heart failure: Secondary | ICD-10-CM

## 2022-08-10 DIAGNOSIS — I5042 Chronic combined systolic (congestive) and diastolic (congestive) heart failure: Secondary | ICD-10-CM

## 2022-08-10 DIAGNOSIS — E785 Hyperlipidemia, unspecified: Secondary | ICD-10-CM | POA: Diagnosis not present

## 2022-08-10 DIAGNOSIS — I10 Essential (primary) hypertension: Secondary | ICD-10-CM

## 2022-08-10 DIAGNOSIS — I252 Old myocardial infarction: Secondary | ICD-10-CM | POA: Diagnosis not present

## 2022-08-10 LAB — ECHOCARDIOGRAM COMPLETE
Area-P 1/2: 4.89 cm2
S' Lateral: 4.1 cm
Single Plane A4C EF: 36 %

## 2022-08-10 MED ORDER — PERFLUTREN LIPID MICROSPHERE
1.0000 mL | INTRAVENOUS | Status: AC | PRN
  Administered 2022-08-10: 2 mL via INTRAVENOUS

## 2022-08-11 ENCOUNTER — Ambulatory Visit (INDEPENDENT_AMBULATORY_CARE_PROVIDER_SITE_OTHER): Payer: Medicare HMO | Admitting: Family Medicine

## 2022-08-11 ENCOUNTER — Encounter: Payer: Self-pay | Admitting: Family Medicine

## 2022-08-11 ENCOUNTER — Other Ambulatory Visit: Payer: Self-pay | Admitting: Family Medicine

## 2022-08-11 ENCOUNTER — Telehealth: Payer: Self-pay | Admitting: Cardiology

## 2022-08-11 VITALS — BP 124/72 | HR 69 | Ht 71.0 in | Wt 212.0 lb

## 2022-08-11 DIAGNOSIS — R6881 Early satiety: Secondary | ICD-10-CM | POA: Diagnosis not present

## 2022-08-11 DIAGNOSIS — R140 Abdominal distension (gaseous): Secondary | ICD-10-CM

## 2022-08-11 MED ORDER — SITAGLIPTIN PHOSPHATE 50 MG PO TABS: 50.0000 mg | ORAL_TABLET | Freq: Every day | ORAL | 1 refills | Status: DC

## 2022-08-11 NOTE — Telephone Encounter (Signed)
The patient has been notified of the result and verbalized understanding.  All questions (if any) were answered.  Pt aware he will need on office visit with Dr. Johney Frame for sometime soon, to further discuss echo findings and treatment of mildly reduced EF and reduced pumping function on the right side of the heart.  Right heart function has declined since prior echo.  Scheduled the pt to come into the office to see Dr. Johney Frame on 09/14/22 at 1140.  He is aware to arrive 15 mins prior to that appt.    Pt verbalized understanding and agrees with this plan.

## 2022-08-11 NOTE — Telephone Encounter (Signed)
-----   Message from Sueanne Margarita, MD sent at 08/10/2022  2:57 PM EST ----- 2D echo showed mildly reduced LVF with EF 40-45% and mildly thickened and stiff heart muscle.  There is reduced pumping function on the right side of the heart and mildlhy leaky MV and calcification of the AV. Compared to prior echo the right heart function has declined.  Please get back in to followup with Dr. Johney Frame

## 2022-08-11 NOTE — Telephone Encounter (Signed)
Requested Prescriptions  Pending Prescriptions Disp Refills   sitaGLIPtin (JANUVIA) 50 MG tablet 90 tablet 1    Sig: Take 1 tablet (50 mg total) by mouth daily.     Endocrinology:  Diabetes - DPP-4 Inhibitors Failed - 08/11/2022  8:57 AM      Failed - Valid encounter within last 6 months    Recent Outpatient Visits           10 months ago Right groin pain   [place] [name], MD   1 year ago Type 2 diabetes mellitus with hyperlipidemia ([place])   [facility], [name], MD   1 year ago Colon cancer screening   [place] [name], MD   1 year ago Hypercalcemia   [place] [name], MD   1 year ago Seasonal allergies   [place] Pickard, [name], MD       Future Appointments             In 1 month Johney Frame, Greer Ee, MD [facility] [place]. [facility], [facility]   In 5 months Johney Frame, Greer Ee, MD [facility] [address] A [facility] [place]. [facility], [facility]            Passed - HBA1C is between 0 and 7.9 and within 180 days    Hgb A1c MFr Bld  Date Value Ref Range Status  05/10/2022 7.1 (H) <5.7 % of total Hgb Final    Comment:    For someone without known diabetes, a hemoglobin A1c value of 6.5% or greater indicates that they may have  diabetes and this should be confirmed with a follow-up  test. . For someone with known diabetes, a value <7% indicates  that their diabetes is well controlled and a value  greater than or equal to 7% indicates suboptimal  control. A1c targets should be individualized based on  duration of diabetes, age, comorbid conditions, and  other considerations. . Currently, no consensus exists regarding use of hemoglobin A1c for diagnosis of diabetes for children. .          Passed - Cr in normal range and within 360 days    Creat  Date Value Ref  Range Status  05/10/2022 0.84 0.70 - 1.28 mg/dL Final

## 2022-08-11 NOTE — Telephone Encounter (Signed)
Prescription Request  08/11/2022  Is this a "Controlled Substance" medicine? No  LOV: 08/02/2022  What is the name of the medication or equipment? Januvia 50 mg tablet 90 day  Have you contacted your pharmacy to request a refill? Yes   Which pharmacy would you like this sent to?  [id number] [place], [place] [id number]: [phone]: [phone] [id number] [place]: [phone]: [phone] [address]: [phone]: [phone]  Patient notified that their request is being sent to the clinical staff for review and that they should receive a response within 2 business days.   Please advise at [place]

## 2022-08-11 NOTE — Telephone Encounter (Signed)
Pt returning nurses call regarding results. Please advise 

## 2022-08-11 NOTE — Progress Notes (Signed)
Subjective:    Patient ID: Cameron Daniel, male    DOB: 07-Apr-1946, 76 y.o.   MRN: [id number]  HPI 08/02/22 Patient is a 76 year old Caucasian gentleman who reports several weeks of bloating and abdominal discomfort.  He states that he is having early satiety.  He states that he will eat very little and suddenly feel extremely ill and start developing.  He has a lot of upper abdominal gas and distention per his report.  He also feels nauseated.  He denies any vomiting.  He denies any hematemesis.  He denies any dysphagia.  He denies any food getting stuck in his throat.  However he states that he feels like he eats 1 or 2 bites and cannot eat anymore due to bloating and distention.  He does occasionally have constipation but he still having bowel movements almost on a daily basis.  He denies any melena or hematochezia.  He denies any diarrhea.  He denies any fevers or chills.  He denies any abdominal pain.  He denies any significant weight loss.  At that time, my plan was:  Differential diagnosis is broad.  First am concerned about possible gastroparesis given his longstanding history of diabetes as well as his neuropathy in his feet.  Therefore we will try the patient empirically on Reglan 10 mg 3 times a day for a week and see if that helps.  He is already on twice daily Protonix and Pepcid so I do not feel that this is acid reflux.  I would be concerned about gastric outlet obstruction.  So if symptoms or not improving, consider a GI referral for EGD.  Patient has recently had a colonoscopy.  However he would be at risk for possible constipation causing bloating and abdominal distention so I will send the patient for abdominal x-ray to evaluate for an elevated stool burden and if present, we will try to facilitate with a better stool regimen.  08/11/22 Abdominal x-ray was unremarkable.  Patient tried MiraLAX for 4 to 5 days and had several good bowel movements however he continues to report  abdominal bloating, increased gas and burping, and early satiety.  He states that he will eat 1 or 2 bites and get full.  He denies any vomiting.  He denies any abdominal pain.  He denies any fevers or chills.  He is already on twice daily Protonix. Past Medical History:  Diagnosis Date   Allergic rhinitis, cause unspecified    Allergy    Anxiety    Arthritis    Cancer ([place])    a couple places removed from eyelid   Carotid Doppler 04/2020   Carotid US 9/21: No evidence of ICA stenosis bilaterally; right vertebral artery stenosis   Cataract    Chronic combined systolic (congestive) and diastolic (congestive) heart failure (HCC)    Coronary atherosclerosis of unspecified type of vessel, native or graft    a. MI 1985 with unclear details. b. CABG 03/2019 with LIMA -> LAD, SVG -> OM, SVG to dRCA.   Depression    PTSD    Diverticulosis of colon (without mention of hemorrhage)    Esophageal reflux    Esophageal stricture    Essential hypertension    Former tobacco use    Hemorrhoids    Hyperlipidemia    ICD (implantable cardioverter-defibrillator) in place    Irritable bowel syndrome    Ischemic cardiomyopathy    Mild carotid artery disease ([place])    a. 1-39% bilaterally 03/2019 duplex.  Myocardial infarction (HCC)    Nocturia    Obesity, unspecified    Orthostasis    Other acquired absence of organ    Personal history of colonic polyps    Psychosexual dysfunction with inhibited sexual excitement    Retroperitoneal bleed    Type II or unspecified type diabetes mellitus without mention of complication, not stated as uncontrolled    VT (ventricular tachycardia) ([place])    Past Surgical History:  Procedure Laterality Date   ADENOIDECTOMY     CARPAL TUNNEL RELEASE     left    CHOLECYSTECTOMY     COLON RESECTION     18 inches   COLON SURGERY  2013   CORONARY ANGIOPLASTY     1985    CORONARY ARTERY BYPASS GRAFT N/A 04/26/2019   Procedure: CORONARY ARTERY BYPASS GRAFTING (CABG) x  Three, using left internal mammary artery and right leg greater saphenous vein harvested endoscopically;  Surgeon: Gaye Pollack, MD;  Location: [place] [facility];  Service: Open Heart Surgery;  Laterality: N/A;   ELBOW BURSA SURGERY     EYE SURGERY     ICD IMPLANT N/A 06/25/2020   Procedure: ICD IMPLANT;  Surgeon: Vickie Epley, MD;  Location: [facility] [facility];  Service: Cardiovascular;  Laterality: N/A;   LEFT HEART CATH AND CORS/GRAFTS ANGIOGRAPHY N/A 06/19/2020   Procedure: LEFT HEART CATH AND CORS/GRAFTS ANGIOGRAPHY;  Surgeon: Troy Sine, MD;  Location: [facility];  Service: Cardiovascular;  Laterality: N/A;   POLYPECTOMY     RIGHT/LEFT HEART CATH AND CORONARY ANGIOGRAPHY N/A 04/16/2019   Procedure: RIGHT/LEFT HEART CATH AND CORONARY ANGIOGRAPHY;  Surgeon: Belva Crome, MD;  Location: [facility];  Service: Cardiovascular;  Laterality: N/A;   SHOULDER SURGERY     Rt. Shoulder   TEE WITHOUT CARDIOVERSION N/A 04/26/2019   Procedure: TRANSESOPHAGEAL ECHOCARDIOGRAM (TEE);  Surgeon: Gaye Pollack, MD;  Location: [facility];  Service: Open Heart Surgery;  Laterality: N/A;   TONSILLECTOMY     V TACH ABLATION N/A 06/22/2020   Procedure: V TACH ABLATION;  Surgeon: Vickie Epley, MD;  Location: [facility];  Service: Cardiovascular;  Laterality: N/A;   Current Outpatient Medications on File Prior to Visit  Medication Sig Dispense Refill   acetaminophen (TYLENOL) 500 MG tablet Take 1,000 mg by mouth at bedtime as needed for mild pain.     aspirin EC 81 MG tablet Take 1 tablet (81 mg total) by mouth daily. 90 tablet 3   azelastine (ASTELIN) 0.1 % nasal spray Place 2 sprays into both nostrils 2 (two) times daily. Use in each nostril as directed 30 mL 12   carvedilol (COREG) 12.5 MG tablet Take 1 tablet (12.5 mg total) by mouth 2 (two) times daily with a meal. 180 tablet 3   Cetirizine HCl (ZERVIATE) 0.24 % SOLN Apply 1 drop to eye 2 (two) times daily. 30 each 3   citalopram  (CELEXA) 10 MG tablet TAKE 1 TABLET BY MOUTH EVERY DAY 90 tablet 1   colestipol (COLESTID) 5 g packet Take 5 g by mouth daily.      COMIRNATY SUSP injection      diphenoxylate-atropine (LOMOTIL) 2.5-0.025 MG tablet Take 2 tablets by mouth 4 (four) times daily as needed for diarrhea or loose stools. 30 tablet 0   famotidine (PEPCID) 20 MG tablet Take 1 tablet (20 mg total) by mouth at bedtime. 30 tablet 2   fexofenadine (ALLEGRA) 180 MG tablet Take 1  tablet (180 mg total) by mouth daily as needed. For allergies 90 tablet 0   finasteride (PROSCAR) 5 MG tablet TAKE 1 TABLET BY MOUTH EVERY DAY 90 tablet 3   JANUVIA 50 MG tablet TAKE 1 TABLET BY MOUTH EVERY DAY 90 tablet 0   JARDIANCE 25 MG TABS tablet TAKE 25 MG (1 TABLET) BY MOUTH DAILY BEFORE BREAKFAST. STOP PIOGLITAZONE 90 tablet 1   losartan (COZAAR) 25 MG tablet Take 1 tablet (25 mg total) by mouth at bedtime. 90 tablet 2   loteprednol (LOTEMAX) 0.5 % ophthalmic suspension Place 1 drop into both eyes 4 (four) times daily as needed (itching). 5 mL 1   magnesium oxide (MAG-OX) 400 MG tablet Take 1 tablet (400 mg total) by mouth daily. 90 tablet 3   metoCLOPramide (REGLAN) 10 MG tablet Take 1 tablet (10 mg total) by mouth 3 (three) times daily before meals. 30 tablet 0   montelukast (SINGULAIR) 10 MG tablet TAKE 1 TABLET BY MOUTH EVERYDAY AT BEDTIME 90 tablet 3   nystatin cream (MYCOSTATIN) Apply 1 Application topically 3 (three) times daily. 30 g 1   pantoprazole (PROTONIX) 40 MG tablet Take 1 tablet (40 mg total) by mouth 2 (two) times daily. TAKE 1 TABLET BY MOUTH EVERY DAY IN THE MORNING 90 tablet 3   rosuvastatin (CRESTOR) 40 MG tablet TAKE 1 TABLET BY MOUTH DAILY AT 6 PM. 90 tablet 3   vitamin B-12 (CYANOCOBALAMIN) 100 MCG tablet Take 100 mcg by mouth daily.     No current facility-administered medications on file prior to visit.   Allergies  Allergen Reactions   Doxazosin Mesylate Other (See Comments)    Drops in blood pressure     Losartan     Other reaction(s): Muscle pain   Testosterone     Other reaction(s): Erythrocytosis Other reaction(s): Erythrocytosis   Flomax [Tamsulosin Hcl] Itching and Other (See Comments)    Drops in blood pressure     Penicillins Rash and Other (See Comments)    Has patient had a PCN reaction causing immediate rash, facial/tongue/throat swelling, SOB or lightheadedness with hypotension: No Has patient had a PCN reaction causing severe rash involving mucus membranes or skin necrosis: No Has patient had a PCN reaction that required hospitalization No Has patient had a PCN reaction occurring within the last 10 years: No If all of the above answers are "NO", then may proceed with Cephalosporin use.    Sulfonamide Derivatives Rash   Trulicity [Dulaglutide] Nausea And Vomiting    N&V, Dizziness   Social History   Socioeconomic History   Marital status: Married    Spouse name: Not on file   Number of children: 1   Years of education: 12   Highest education level: High school graduate  Occupational History   Occupation: [employer]  Tobacco Use   Smoking status: Former    Types: Cigarettes    Quit date: 08/30/1983    Years since quitting: 38.9   Smokeless tobacco: Former   Tobacco comments:    Quit 30 years ago.  Vaping Use   Vaping Use: Never used  Substance and Sexual Activity   Alcohol use: Yes    Comment: rare   Drug use: No   Sexual activity: Yes  Other Topics Concern   Not on file  Social History Narrative   Not on file   Social Determinants of Health   Financial Resource Strain: Low Risk  (09/02/2021)   Overall Financial Resource Strain (CARDIA)  Difficulty of Paying Living Expenses: Not hard at all  Food Insecurity: No Food Insecurity (09/02/2021)   Hunger Vital Sign    Worried About Running Out of Food in the Last Year: Never true    Ran Out of Food in the Last Year: Never true  Transportation Needs: No Transportation Needs (09/02/2021)   PRAPARE - Armed forces logistics/support/administrative officer (Medical): No    Lack of Transportation (Non-Medical): No  Physical Activity: Inactive (09/02/2021)   Exercise Vital Sign    Days of Exercise per Week: 0 days    Minutes of Exercise per Session: 0 min  Stress: No Stress Concern Present (09/02/2021)   [place]    Feeling of Stress : Not at all  Social Connections: Moderately Isolated (09/02/2021)   Social Connection and Isolation Panel [NHANES]    Frequency of Communication with Friends and Family: More than three times a week    Frequency of Social Gatherings with Friends and Family: More than three times a week    Attends Religious Services: Never    Marine scientist or Organizations: No    Attends Archivist Meetings: Never    Marital Status: Married  Human resources officer Violence: Not At Risk (09/02/2021)   Humiliation, Afraid, Rape, and Kick questionnaire    Fear of Current or Ex-Partner: No    Emotionally Abused: No    Physically Abused: No    Sexually Abused: No     Review of Systems     Objective:   Physical Exam Vitals reviewed.  Constitutional:      General: He is not in acute distress.    Appearance: Normal appearance. He is obese. He is not ill-appearing or toxic-appearing.  HENT:     Right Ear: Tympanic membrane and ear canal normal.     Left Ear: Tympanic membrane and ear canal normal.     Nose: Nose normal.     Mouth/Throat:     Mouth: Mucous membranes are moist.  Eyes:     General: Lids are normal.     Extraocular Movements:     Right eye: Normal extraocular motion.     Left eye: Normal extraocular motion.     Conjunctiva/sclera:     Left eye: Left conjunctiva is not injected.  Cardiovascular:     Rate and Rhythm: Normal rate and regular rhythm.     Pulses: Normal pulses.     Heart sounds: Normal heart sounds. No murmur heard.    No friction rub. No gallop.  Pulmonary:     Effort: Pulmonary effort is  normal. No respiratory distress.     Breath sounds: Normal breath sounds. No stridor. No wheezing, rhonchi or rales.  Abdominal:     General: Abdomen is flat. Bowel sounds are normal. There is no distension.     Palpations: Abdomen is soft. There is no mass.     Tenderness: There is no abdominal tenderness. There is no guarding or rebound.     Hernia: No hernia is present.  Musculoskeletal:     Right lower leg: No edema.     Left lower leg: No edema.     Right foot: Normal range of motion and normal capillary refill. Tenderness present. No swelling, prominent metatarsal heads or crepitus.     Left foot: Normal range of motion and normal capillary refill. No swelling, tenderness or crepitus.  Skin:    Findings: No rash.  Neurological:     General: No focal deficit present.     Mental Status: He is alert and oriented to person, place, and time. Mental status is at baseline.  Psychiatric:        Mood and Affect: Mood normal.        Behavior: Behavior normal.        Thought Content: Thought content normal.        Judgment: Judgment normal.           Assessment & Plan:  Bloating  Early satiety  Abdominal distention I encouraged the patient to try the metoclopramide for a few days.  Patient will take metoclopramide before meals 3 times a day and then recheck in 1 week.  If the early satiety, bloating, and gas improved, we may need to work the patient up for gastroparesis given his longstanding diabetes.  If the bloating and gas and early satiety do not improve, I would recommend auscultation for EGD.  This would be to rule out gastric outlet obstruction or stricture.  If EGD is normal, the next likely diagnosis would be IBS

## 2022-08-15 ENCOUNTER — Other Ambulatory Visit: Payer: Self-pay | Admitting: Family Medicine

## 2022-08-15 MED ORDER — PANTOPRAZOLE SODIUM 40 MG PO TBEC: 40.0000 mg | DELAYED_RELEASE_TABLET | Freq: Two times a day (BID) | ORAL | 11 refills | Status: DC

## 2022-08-23 ENCOUNTER — Telehealth: Payer: Self-pay

## 2022-08-23 NOTE — Telephone Encounter (Signed)
Pt called in to see if he could get an earlier appt scheduled with gastroenterology sooner that 09/27/22. Pt states that he wouldn't mind if referral was sent to another dr that may be able to see him earlier. Please advise.  Cb#: [phone]

## 2022-08-24 ENCOUNTER — Telehealth: Payer: Self-pay | Admitting: Internal Medicine

## 2022-08-24 NOTE — Telephone Encounter (Signed)
Patient called, stated his issues have gotten worse. He is belching and feels like something is lodged in his throat whenever he eats. Stated he could not wait until the end of January to see Dr. Henrene Pastor. Would like sooner appt. Please advise.

## 2022-08-24 NOTE — Telephone Encounter (Signed)
Patient called to follow up on message; gave him the direct number for the referral coordinator [place]. Patient will follow up with her.

## 2022-08-24 NOTE — Telephone Encounter (Signed)
Pt states for about 6 weeks he has been having lots of burping. He can only eat a few bites and he feels full. Reports when he lays down the burping stops. States yesterday he had diarrhea and today he is constipated. Requesting an appt with Dr. Henrene Pastor. Pt scheduled to see Dr. Henrene Pastor 09/15/22 at 10:20am. Pt aware of appt.

## 2022-09-05 ENCOUNTER — Other Ambulatory Visit: Payer: Self-pay

## 2022-09-05 MED ORDER — EMPAGLIFLOZIN 25 MG PO TABS: ORAL_TABLET | ORAL | 1 refills | Status: DC

## 2022-09-07 NOTE — Patient Instructions (Incomplete)
Cameron Daniel , Thank you for taking time to come for your Medicare Wellness Visit. I appreciate your ongoing commitment to your health goals. Please review the following plan we discussed and let me know if I can assist you in the future.   These are the goals we discussed:  Goals      Exercise 3x per week (30 min per time)     Increase exercise as tolerated. Get boat fixed.         This is a list of the screening recommended for you and due dates:  Health Maintenance  Topic Date Due   Yearly kidney health urinalysis for diabetes  Never done   Complete foot exam   05/05/2021   COVID-19 Vaccine (7 - 2023-24 season) 08/09/2022   Medicare Annual Wellness Visit  09/02/2022   Hepatitis C Screening: USPSTF Recommendation to screen - Ages 18-79 yo.  08/03/2023*   Eye exam for diabetics  09/10/2022   Hemoglobin A1C  11/08/2022   Yearly kidney function blood test for diabetes  05/11/2023   Cologuard (Stool DNA test)  06/08/2024   Pneumonia Vaccine  Completed   Flu Shot  Completed   Zoster (Shingles) Vaccine  Completed   HPV Vaccine  Aged Out   DTaP/Tdap/Td vaccine  Discontinued   Colon Cancer Screening  Discontinued  *Topic was postponed. The date shown is not the original due date.    Advanced directives: ***  Conditions/risks identified: Aim for 30 minutes of exercise or brisk walking, 6-8 glasses of water, and 5 servings of fruits and vegetables each day.   Next appointment: Follow up in one year for your annual wellness visit.   Preventive Care 76 Years and Older, Male  Preventive care refers to lifestyle choices and visits with your health care provider that can promote health and wellness. What does preventive care include? A yearly physical exam. This is also called an annual well check. Dental exams once or twice a year. Routine eye exams. Ask your health care provider how often you should have your eyes checked. Personal lifestyle choices, including: Daily care of your  teeth and gums. Regular physical activity. Eating a healthy diet. Avoiding tobacco and drug use. Limiting alcohol use. Practicing safe sex. Taking low doses of aspirin every day. Taking vitamin and mineral supplements as recommended by your health care provider. What happens during an annual well check? The services and screenings done by your health care provider during your annual well check will depend on your age, overall health, lifestyle risk factors, and family history of disease. Counseling  Your health care provider may ask you questions about your: Alcohol use. Tobacco use. Drug use. Emotional well-being. Home and relationship well-being. Sexual activity. Eating habits. History of falls. Memory and ability to understand (cognition). Work and work Statistician. Screening  You may have the following tests or measurements: Height, weight, and BMI. Blood pressure. Lipid and cholesterol levels. These may be checked every 5 years, or more frequently if you are over 3 years old. Skin check. Lung cancer screening. You may have this screening every year starting at age 55 if you have a 30-pack-year history of smoking and currently smoke or have quit within the past 15 years. Fecal occult blood test (FOBT) of the stool. You may have this test every year starting at age 40. Flexible sigmoidoscopy or colonoscopy. You may have a sigmoidoscopy every 5 years or a colonoscopy every 10 years starting at age 60. Prostate cancer screening. Recommendations will  vary depending on your family history and other risks. Hepatitis C blood test. Hepatitis B blood test. Sexually transmitted disease (STD) testing. Diabetes screening. This is done by checking your blood sugar (glucose) after you have not eaten for a while (fasting). You may have this done every 1-3 years. Abdominal aortic aneurysm (AAA) screening. You may need this if you are a current or former smoker. Osteoporosis. You may be  screened starting at age 76 if you are at high risk. Talk with your health care provider about your test results, treatment options, and if necessary, the need for more tests. Vaccines  Your health care provider may recommend certain vaccines, such as: Influenza vaccine. This is recommended every year. Tetanus, diphtheria, and acellular pertussis (Tdap, Td) vaccine. You may need a Td booster every 10 years. Zoster vaccine. You may need this after age 76. Pneumococcal 13-valent conjugate (PCV13) vaccine. One dose is recommended after age 76. Pneumococcal polysaccharide (PPSV23) vaccine. One dose is recommended after age 76. Talk to your health care provider about which screenings and vaccines you need and how often you need them. This information is not intended to replace advice given to you by your health care provider. Make sure you discuss any questions you have with your health care provider. Document Released: 09/11/2015 Document Revised: 05/04/2016 Document Reviewed: 06/16/2015 Elsevier Interactive Patient Education  2017 [place] Prevention in the Home Falls can cause injuries. They can happen to people of all ages. There are many things you can do to make your home safe and to help prevent falls. What can I do on the outside of my home? Regularly fix the edges of walkways and driveways and fix any cracks. Remove anything that might make you trip as you walk through a door, such as a raised step or threshold. Trim any bushes or trees on the path to your home. Use bright outdoor lighting. Clear any walking paths of anything that might make someone trip, such as rocks or tools. Regularly check to see if handrails are loose or broken. Make sure that both sides of any steps have handrails. Any raised decks and porches should have guardrails on the edges. Have any leaves, snow, or ice cleared regularly. Use sand or salt on walking paths during winter. Clean up any spills in  your garage right away. This includes oil or grease spills. What can I do in the bathroom? Use night lights. Install grab bars by the toilet and in the tub and shower. Do not use towel bars as grab bars. Use non-skid mats or decals in the tub or shower. If you need to sit down in the shower, use a plastic, non-slip stool. Keep the floor dry. Clean up any water that spills on the floor as soon as it happens. Remove soap buildup in the tub or shower regularly. Attach bath mats securely with double-sided non-slip rug tape. Do not have throw rugs and other things on the floor that can make you trip. What can I do in the bedroom? Use night lights. Make sure that you have a light by your bed that is easy to reach. Do not use any sheets or blankets that are too big for your bed. They should not hang down onto the floor. Have a firm chair that has side arms. You can use this for support while you get dressed. Do not have throw rugs and other things on the floor that can make you trip. What can I do in  the kitchen? Clean up any spills right away. Avoid walking on wet floors. Keep items that you use a lot in easy-to-reach places. If you need to reach something above you, use a strong step stool that has a grab bar. Keep electrical cords out of the way. Do not use floor polish or wax that makes floors slippery. If you must use wax, use non-skid floor wax. Do not have throw rugs and other things on the floor that can make you trip. What can I do with my stairs? Do not leave any items on the stairs. Make sure that there are handrails on both sides of the stairs and use them. Fix handrails that are broken or loose. Make sure that handrails are as long as the stairways. Check any carpeting to make sure that it is firmly attached to the stairs. Fix any carpet that is loose or worn. Avoid having throw rugs at the top or bottom of the stairs. If you do have throw rugs, attach them to the floor with carpet  tape. Make sure that you have a light switch at the top of the stairs and the bottom of the stairs. If you do not have them, ask someone to add them for you. What else can I do to help prevent falls? Wear shoes that: Do not have high heels. Have rubber bottoms. Are comfortable and fit you well. Are closed at the toe. Do not wear sandals. If you use a stepladder: Make sure that it is fully opened. Do not climb a closed stepladder. Make sure that both sides of the stepladder are locked into place. Ask someone to hold it for you, if possible. Clearly mark and make sure that you can see: Any grab bars or handrails. First and last steps. Where the edge of each step is. Use tools that help you move around (mobility aids) if they are needed. These include: Canes. Walkers. Scooters. Crutches. Turn on the lights when you go into a dark area. Replace any light bulbs as soon as they burn out. Set up your furniture so you have a clear path. Avoid moving your furniture around. If any of your floors are uneven, fix them. If there are any pets around you, be aware of where they are. Review your medicines with your doctor. Some medicines can make you feel dizzy. This can increase your chance of falling. Ask your doctor what other things that you can do to help prevent falls. This information is not intended to replace advice given to you by your health care provider. Make sure you discuss any questions you have with your health care provider. Document Released: 06/11/2009 Document Revised: 01/21/2016 Document Reviewed: 09/19/2014 Elsevier Interactive Patient Education  2017 [employer].

## 2022-09-07 NOTE — Progress Notes (Deleted)
Subjective:   Cameron Daniel is a 77 y.o. male who presents for Medicare Annual/Subsequent preventive examination.  I connected with  Thornton Papas on 09/07/22 by a audio enabled telemedicine application and verified that I am speaking with the correct person using two identifiers.  Patient Location: Home  Provider Location: Office/Clinic  I discussed the limitations of evaluation and management by telemedicine. The patient expressed understanding and agreed to proceed.  Review of Systems           Objective:    There were no vitals filed for this visit. There is no height or weight on file to calculate BMI.     09/02/2021   10:47 AM 02/22/2021    2:18 PM 06/19/2020    8:53 AM 04/15/2019    9:00 AM 04/12/2019    1:47 PM 04/11/2019   12:43 PM 02/15/2019   10:09 AM  Advanced Directives  Does Patient Have a Medical Advance Directive? Yes Yes No Yes Yes Yes No  Type of Paramedic of [place];Living will Healthcare Power of [place] of [place]   Does patient want to make changes to medical advance directive?  Yes (MAU/Ambulatory/Procedural Areas - Information given)  No - Patient declined  No - Patient declined   Copy of [place] in Chart? No - copy requested No - copy requested       Would patient like information on creating a medical advance directive?   No - Patient declined    No - Patient declined    Current Medications (verified) Outpatient Encounter Medications as of 09/08/2022  Medication Sig   pantoprazole (PROTONIX) 40 MG tablet Take 1 tablet (40 mg total) by mouth 2 (two) times daily.   acetaminophen (TYLENOL) 500 MG tablet Take 1,000 mg by mouth at bedtime as needed for mild pain.   aspirin EC 81 MG tablet Take 1 tablet (81 mg total) by mouth daily.   azelastine (ASTELIN) 0.1 % nasal spray Place 2 sprays into both nostrils 2 (two) times  daily. Use in each nostril as directed   carvedilol (COREG) 12.5 MG tablet Take 1 tablet (12.5 mg total) by mouth 2 (two) times daily with a meal.   Cetirizine HCl (ZERVIATE) 0.24 % SOLN Apply 1 drop to eye 2 (two) times daily.   citalopram (CELEXA) 10 MG tablet TAKE 1 TABLET BY MOUTH EVERY DAY   colestipol (COLESTID) 5 g packet Take 5 g by mouth daily.    COMIRNATY SUSP injection    diphenoxylate-atropine (LOMOTIL) 2.5-0.025 MG tablet Take 2 tablets by mouth 4 (four) times daily as needed for diarrhea or loose stools.   empagliflozin (JARDIANCE) 25 MG TABS tablet TAKE 25 MG (1 TABLET) BY MOUTH DAILY BEFORE BREAKFAST. STOP PIOGLITAZONE   famotidine (PEPCID) 20 MG tablet Take 1 tablet (20 mg total) by mouth at bedtime.   fexofenadine (ALLEGRA) 180 MG tablet Take 1 tablet (180 mg total) by mouth daily as needed. For allergies   finasteride (PROSCAR) 5 MG tablet TAKE 1 TABLET BY MOUTH EVERY DAY   losartan (COZAAR) 25 MG tablet Take 1 tablet (25 mg total) by mouth at bedtime.   loteprednol (LOTEMAX) 0.5 % ophthalmic suspension Place 1 drop into both eyes 4 (four) times daily as needed (itching).   magnesium oxide (MAG-OX) 400 MG tablet Take 1 tablet (400 mg total) by mouth daily.   metoCLOPramide (REGLAN) 10 MG tablet Take  1 tablet (10 mg total) by mouth 3 (three) times daily before meals.   montelukast (SINGULAIR) 10 MG tablet TAKE 1 TABLET BY MOUTH EVERYDAY AT BEDTIME   nystatin cream (MYCOSTATIN) Apply 1 Application topically 3 (three) times daily.   rosuvastatin (CRESTOR) 40 MG tablet TAKE 1 TABLET BY MOUTH DAILY AT 6 PM.   sitaGLIPtin (JANUVIA) 50 MG tablet Take 1 tablet (50 mg total) by mouth daily.   vitamin B-12 (CYANOCOBALAMIN) 100 MCG tablet Take 100 mcg by mouth daily.   No facility-administered encounter medications on file as of 09/08/2022.    Allergies (verified) Doxazosin mesylate, Losartan, Testosterone, Flomax [tamsulosin hcl], Penicillins, Sulfonamide derivatives, and Trulicity  [dulaglutide]   History: Past Medical History:  Diagnosis Date   Allergic rhinitis, cause unspecified    Allergy    Anxiety    Arthritis    Cancer ([place])    a couple places removed from eyelid   Carotid Doppler 04/2020   Carotid US 9/21: No evidence of ICA stenosis bilaterally; right vertebral artery stenosis   Cataract    Chronic combined systolic (congestive) and diastolic (congestive) heart failure (HCC)    Coronary atherosclerosis of unspecified type of vessel, native or graft    a. MI 1985 with unclear details. b. CABG 03/2019 with LIMA -> LAD, SVG -> OM, SVG to dRCA.   Depression    PTSD    Diverticulosis of colon (without mention of hemorrhage)    Esophageal reflux    Esophageal stricture    Essential hypertension    Former tobacco use    Hemorrhoids    Hyperlipidemia    ICD (implantable cardioverter-defibrillator) in place    Irritable bowel syndrome    Ischemic cardiomyopathy    Mild carotid artery disease ([place])    a. 1-39% bilaterally 03/2019 duplex.   Myocardial infarction (HCC)    Nocturia    Obesity, unspecified    Orthostasis    Other acquired absence of organ    Personal history of colonic polyps    Psychosexual dysfunction with inhibited sexual excitement    Retroperitoneal bleed    Type II or unspecified type diabetes mellitus without mention of complication, not stated as uncontrolled    VT (ventricular tachycardia) ([place])    Past Surgical History:  Procedure Laterality Date   ADENOIDECTOMY     CARPAL TUNNEL RELEASE     left    CHOLECYSTECTOMY     COLON RESECTION     18 inches   COLON SURGERY  2013   CORONARY ANGIOPLASTY     1985    CORONARY ARTERY BYPASS GRAFT N/A 04/26/2019   Procedure: CORONARY ARTERY BYPASS GRAFTING (CABG) x Three, using left internal mammary artery and right leg greater saphenous vein harvested endoscopically;  Surgeon: Gaye Pollack, MD;  Location: [facility];  Service: Open Heart Surgery;  Laterality: N/A;   ELBOW BURSA SURGERY      EYE SURGERY     ICD IMPLANT N/A 06/25/2020   Procedure: ICD IMPLANT;  Surgeon: Vickie Epley, MD;  Location: [facility];  Service: Cardiovascular;  Laterality: N/A;   LEFT HEART CATH AND CORS/GRAFTS ANGIOGRAPHY N/A 06/19/2020   Procedure: LEFT HEART CATH AND CORS/GRAFTS ANGIOGRAPHY;  Surgeon: Troy Sine, MD;  Location: [facility];  Service: Cardiovascular;  Laterality: N/A;   POLYPECTOMY     RIGHT/LEFT HEART CATH AND CORONARY ANGIOGRAPHY N/A 04/16/2019   Procedure: RIGHT/LEFT HEART CATH AND CORONARY ANGIOGRAPHY;  Surgeon: Belva Crome, MD;  Location: [facility];  Service: Cardiovascular;  Laterality: N/A;   SHOULDER SURGERY     Rt. Shoulder   TEE WITHOUT CARDIOVERSION N/A 04/26/2019   Procedure: TRANSESOPHAGEAL ECHOCARDIOGRAM (TEE);  Surgeon: Gaye Pollack, MD;  Location: [facility];  Service: Open Heart Surgery;  Laterality: N/A;   TONSILLECTOMY     V TACH ABLATION N/A 06/22/2020   Procedure: V TACH ABLATION;  Surgeon: Vickie Epley, MD;  Location: [facility];  Service: Cardiovascular;  Laterality: N/A;   Family History  Problem Relation Age of Onset   Lung cancer Father    Allergic rhinitis Father    Coronary artery disease Mother    Colon cancer Neg Hx    Heart attack Neg Hx    Hypertension Neg Hx    Stroke Neg Hx    Esophageal cancer Neg Hx    Rectal cancer Neg Hx    Stomach cancer Neg Hx    Pancreatic cancer Neg Hx    Social History   Socioeconomic History   Marital status: Married    Spouse name: Not on file   Number of children: 1   Years of education: 12   Highest education level: High school graduate  Occupational History   Occupation: [employer]  Tobacco Use   Smoking status: Former    Types: Cigarettes    Quit date: 08/30/1983    Years since quitting: 39.0   Smokeless tobacco: Former   Tobacco comments:    Quit 30 years ago.  Vaping Use   Vaping Use: Never used  Substance and Sexual Activity   Alcohol use: Yes     Comment: rare   Drug use: No   Sexual activity: Yes  Other Topics Concern   Not on file  Social History Narrative   Not on file   Social Determinants of Health   Financial Resource Strain: Low Risk  (09/02/2021)   Overall Financial Resource Strain (CARDIA)    Difficulty of Paying Living Expenses: Not hard at all  Food Insecurity: No Food Insecurity (09/02/2021)   Hunger Vital Sign    Worried About Running Out of Food in the Last Year: Never true    Ran Out of Food in the Last Year: Never true  Transportation Needs: No Transportation Needs (09/02/2021)   PRAPARE - Hydrologist (Medical): No    Lack of Transportation (Non-Medical): No  Physical Activity: Inactive (09/02/2021)   Exercise Vital Sign    Days of Exercise per Week: 0 days    Minutes of Exercise per Session: 0 min  Stress: No Stress Concern Present (09/02/2021)   [place]    Feeling of Stress : Not at all  Social Connections: Moderately Isolated (09/02/2021)   Social Connection and Isolation Panel [NHANES]    Frequency of Communication with Friends and Family: More than three times a week    Frequency of Social Gatherings with Friends and Family: More than three times a week    Attends Religious Services: Never    Marine scientist or Organizations: No    Attends Archivist Meetings: Never    Marital Status: Married    Tobacco Counseling Counseling given: Not Answered Tobacco comments: Quit 30 years ago.   Clinical Intake:                 Diabetic?Yes Nutrition Risk Assessment:  Has the patient had any N/V/D within  the last 2 months?  {YES/NO:21197} Does the patient have any non-healing wounds?  {YES/NO:21197} Has the patient had any unintentional weight loss or weight gain?  {YES/NO:21197}  Diabetes:  Is the patient diabetic?  {YES/NO:21197} If diabetic, was a CBG obtained today?   {YES/NO:21197} Did the patient bring in their glucometer from home?  {YES/NO:21197} How often do you monitor your CBG's? ***.   Financial Strains and Diabetes Management:  Are you having any financial strains with the device, your supplies or your medication? {YES/NO:21197}.  Does the patient want to be seen by Chronic Care Management for management of their diabetes?  {YES/NO:21197} Would the patient like to be referred to a Nutritionist or for Diabetic Management?  {YES/NO:21197}  Diabetic Exams:  {Diabetic Eye Exam:2101801} {Diabetic Foot Exam:2101802}          Activities of Daily Living     No data to display          Patient Care Team: [name], MD as PCP - General (Family Medicine) Bensimhon, Shaune Pascal, MD as PCP - Advanced Heart Failure (Cardiology) Vickie Epley, MD as PCP - Electrophysiology (Cardiology) Freada Bergeron, MD as PCP - Cardiology (Cardiology) Edythe Clarity, [facility] as Pharmacist (Pharmacist)  Indicate any recent Medical Services you may have received from other than [facility] providers in the past year (date may be approximate).     Assessment:   This is a routine wellness examination for D[place].  Hearing/Vision screen No results found.  Dietary issues and exercise activities discussed:     Goals Addressed   None    Depression Screen    08/02/2022   12:23 PM 07/25/2022   11:44 AM 05/10/2022   10:48 AM 02/08/2022   10:45 AM 09/02/2021   10:44 AM 02/22/2021    1:42 PM 08/27/2020    9:25 AM  PHQ 2/9 Scores  PHQ - 2 Score 0 0 0 0 0 1 6  PHQ- 9 Score 0 0     14    Fall Risk    08/02/2022   12:22 PM 07/25/2022   11:45 AM 02/08/2022   10:45 AM 09/02/2021   10:47 AM 02/22/2021    1:41 PM  [place] in the past year? 0 0 0 0 0  Number falls in past yr: 0   0   Injury with Fall? 0   0   Risk for fall due to : No Fall Risks   Impaired balance/gait;Impaired mobility   Follow up Falls prevention discussed  Falls  evaluation completed Falls prevention discussed     FALL RISK PREVENTION PERTAINING TO THE HOME:  Any stairs in or around the home? {YES/NO:21197} If so, are there any without handrails? {YES/NO:21197} Home free of loose throw rugs in walkways, pet beds, electrical cords, etc? {YES/NO:21197} Adequate lighting in your home to reduce risk of falls? {YES/NO:21197}  ASSISTIVE DEVICES UTILIZED TO PREVENT FALLS:  Life alert? {YES/NO:21197} Use of a cane, walker or w/c? {YES/NO:21197} Grab bars in the bathroom? {YES/NO:21197} Shower chair or bench in shower? {YES/NO:21197} Elevated toilet seat or a handicapped toilet? {YES/NO:21197}  TIMED UP AND GO:  Was the test performed? {YES/NO:21197}.  Length of time to ambulate 10 feet: *** sec.   {Appearance of GA:4730917  Cognitive Function:        09/02/2021   10:49 AM  6CIT Screen  What Year? 0 points  What month? 0 points  What time? 0 points  Count back from  20 0 points  Months in reverse 0 points  Repeat phrase 0 points  Total Score 0 points    Immunizations Immunization History  Administered Date(s) Administered   Fluad Quad(high Dose 65+) 06/07/2019, 07/02/2020, 05/31/2021, 06/08/2022   H1N1 07/29/2008   Influenza, High Dose Seasonal PF 06/09/2015, 06/08/2022   Influenza, Seasonal, Injecte, Preservative Fre 07/03/2009, 06/02/2010, 06/27/2011, 06/03/2014   Influenza,inj,Quad PF,6+ Mos 05/28/2013, 06/14/2016, 05/29/2017, 05/07/2018   Influenza-Unspecified 06/29/2008, 05/10/2012, 05/29/2012, 06/27/2013, 05/23/2014, 06/07/2016, 06/29/2017, 05/29/2018, 05/30/2019, 06/17/2020   MMR 10/30/2014   PFIZER(Purple Top)SARS-COV-2 Vaccination 10/03/2019, 10/24/2019, 06/01/2020   Pneumococcal Conjugate-13 11/10/2014, 12/08/2016, 03/05/2020   Pneumococcal Polysaccharide-23 10/29/2008, 05/28/2013, 03/07/2016   Tdap 10/24/2011   Zoster Recombinat (Shingrix) 08/15/2017, 01/26/2018   Zoster, Live 03/21/2011, 11/28/2018    TDAP status:  Up to date  Flu Vaccine status: Up to date  Pneumococcal vaccine status: Up to date  Covid-19 vaccine status: Information provided on how to obtain vaccines.   Qualifies for Shingles Vaccine? Yes   Zostavax completed No   Shingrix Completed?: Yes  Screening Tests Health Maintenance  Topic Date Due   Diabetic kidney evaluation - Urine ACR  Never done   FOOT EXAM  05/05/2021   COVID-19 Vaccine (7 - 2023-24 season) 08/09/2022   Medicare Annual Wellness (AWV)  09/02/2022   Hepatitis C Screening  08/03/2023 (Originally 01/05/1964)   OPHTHALMOLOGY EXAM  09/10/2022   HEMOGLOBIN A1C  11/08/2022   Diabetic kidney evaluation - eGFR measurement  05/11/2023   Fecal DNA (Cologuard)  06/08/2024   Pneumonia Vaccine 22+ Years old  Completed   INFLUENZA VACCINE  Completed   Zoster Vaccines- Shingrix  Completed   HPV VACCINES  Aged Out   DTaP/Tdap/Td  Discontinued   COLONOSCOPY (Pts 45-31yr Insurance coverage will need to be confirmed)  Discontinued    Health Maintenance  Health Maintenance Due  Topic Date Due   Diabetic kidney evaluation - Urine ACR  Never done   FOOT EXAM  05/05/2021   COVID-19 Vaccine (7 - 2023-24 season) 08/09/2022   Medicare Annual Wellness (AWV)  09/02/2022    Colorectal cancer screening: No longer required.   Lung Cancer Screening: (Low Dose CT Chest recommended if Age 76-80years, 30 pack-year currently smoking OR have quit w/in 15years.) does not qualify.   Lung Cancer Screening Referral: n/a   Additional Screening:  Hepatitis C Screening: {DOES NOT does:27190::"does not"} qualify; Completed ***  Vision Screening: Recommended annual ophthalmology exams for early detection of glaucoma and other disorders of the eye. Is the patient up to date with their annual eye exam?  {YES/NO:21197} Who is the provider or what is the name of the office in which the patient attends annual eye exams? *** If pt is not established with a provider, would they like to be  referred to a provider to establish care? {YES/NO:21197}.   Dental Screening: Recommended annual dental exams for proper oral hygiene  Community Resource Referral / Chronic Care Management: CRR required this visit?  {YES/NO:21197}  CCM required this visit?  {YES/NO:21197}     Plan:     I have personally reviewed and noted the following in the patient's chart:   Medical and social history Use of alcohol, tobacco or illicit drugs  Current medications and supplements including opioid prescriptions. {Opioid [phone]} Functional ability and status Nutritional status Physical activity Advanced directives List of other physicians Hospitalizations, surgeries, and ER visits in previous 12 months Vitals Screenings to include cognitive, depression, and falls Referrals and appointments  In addition,  I have reviewed and discussed with patient certain preventive protocols, quality metrics, and best practice recommendations. A written personalized care plan for preventive services as well as general preventive health recommendations were provided to patient.     Denman George [place], [place]   [id number]   Nurse Notes: ***

## 2022-09-12 NOTE — Progress Notes (Deleted)
Cardiology Office Note:    Date:  09/12/2022   ID:  [name], Purk [birth year], MRN [id number]  PCP:  [name], MD   [facility] Providers Cardiologist:  Freada Bergeron, MD Electrophysiologist:  Vickie Epley, MD  Advanced Heart Failure:  Glori Bickers, MD  {   Referring MD: [name], MD    History of Present Illness:    Cameron Daniel is a 77 y.o. male with a hx of CAD s/p CABG with LIMA-LAD, SVG-OM, SVG-dRCA, systolic HF with LVEF 56-25%, VT s/p ablation 05/2020 with ICD implantation, HTN, and HLD who was previously followed by Dr. Meda Coffee who now returns to clinic for follow-up.  Per review of the record, the patient had MI in 1985 with unclear intervention, treated by [name] at that time. Aortoiliac duplex 2014 showed no AAA. He did well for many years before presenting back to the ED August 2020 with fever and malaise. He was seen in the ER and given fluids and discharged but returned back with respiratory failure requiring intubation for PNA and CHF and NSTEMI. 2D echo 04/13/19 showed EF 25-30%, severe hypokinesis of the left ventricular, entire inferior wall and anteroseptal wall, mild LAE, small pericardial effusion. Covid testing was negative. Cath showed 3V CAD with low output. He was started on milrinone post-cath. He developed a large spontaneous RP bleed post cath (cath all done from arm). He ultimately underwent CABG on 04/26/19. Pre-op TEE EF 45-50%. Post CABG course was fortunately uneventful. Spironolactone was later stopped due to hyperkalemia, olmestartan stopped due to hypotension, and he followed with CHF team for a period of time before graduating from their services. He has since tolerated losartan OK. In 05/2020 he was admitted with persistent VT. He was initially treated with amiodarone but ultimately cardioverted in ED. LHC showed patent grafts without specific culprit. He was seen by EP and underwent EPS/ablation with scar  modification and ICD implantation. 2D Echo 05/2020 showed EF 45-50%, mild LVH, grade 2 DD, moderate LAE, normal RV.  Last seen in 06/2022 where he was feeling more fatigued which he attributed to his coreg. We decreased his dosing to see if this would help with symptoms.  TTE 07/2022 with LVEF 40-45%, global hypokinesis, G3DD, severe RV dysfunction with moderate RV enlargement, mild MR  Today, ***    Past Medical History:  Diagnosis Date   Allergic rhinitis, cause unspecified    Allergy    Anxiety    Arthritis    Cancer ([place])    a couple places removed from eyelid   Carotid Doppler 04/2020   Carotid US 9/21: No evidence of ICA stenosis bilaterally; right vertebral artery stenosis   Cataract    Chronic combined systolic (congestive) and diastolic (congestive) heart failure (HCC)    Coronary atherosclerosis of unspecified type of vessel, native or graft    a. MI 1985 with unclear details. b. CABG 03/2019 with LIMA -> LAD, SVG -> OM, SVG to dRCA.   Depression    PTSD    Diverticulosis of colon (without mention of hemorrhage)    Esophageal reflux    Esophageal stricture    Essential hypertension    Former tobacco use    Hemorrhoids    Hyperlipidemia    ICD (implantable cardioverter-defibrillator) in place    Irritable bowel syndrome    Ischemic cardiomyopathy    Mild carotid artery disease ([place])    a. 1-39% bilaterally 03/2019 duplex.   Myocardial infarction ([place])  Nocturia    Obesity, unspecified    Orthostasis    Other acquired absence of organ    Personal history of colonic polyps    Psychosexual dysfunction with inhibited sexual excitement    Retroperitoneal bleed    Type II or unspecified type diabetes mellitus without mention of complication, not stated as uncontrolled    VT (ventricular tachycardia) ([place])     Past Surgical History:  Procedure Laterality Date   ADENOIDECTOMY     CARPAL TUNNEL RELEASE     left    CHOLECYSTECTOMY     COLON RESECTION     18  inches   COLON SURGERY  2013   CORONARY ANGIOPLASTY     1985    CORONARY ARTERY BYPASS GRAFT N/A 04/26/2019   Procedure: CORONARY ARTERY BYPASS GRAFTING (CABG) x Three, using left internal mammary artery and right leg greater saphenous vein harvested endoscopically;  Surgeon: Gaye Pollack, MD;  Location: [facility];  Service: Open Heart Surgery;  Laterality: N/A;   ELBOW BURSA SURGERY     EYE SURGERY     ICD IMPLANT N/A 06/25/2020   Procedure: ICD IMPLANT;  Surgeon: Vickie Epley, MD;  Location: [place];  Service: Cardiovascular;  Laterality: N/A;   LEFT HEART CATH AND CORS/GRAFTS ANGIOGRAPHY N/A 06/19/2020   Procedure: LEFT HEART CATH AND CORS/GRAFTS ANGIOGRAPHY;  Surgeon: Troy Sine, MD;  Location: [facility];  Service: Cardiovascular;  Laterality: N/A;   POLYPECTOMY     RIGHT/LEFT HEART CATH AND CORONARY ANGIOGRAPHY N/A 04/16/2019   Procedure: RIGHT/LEFT HEART CATH AND CORONARY ANGIOGRAPHY;  Surgeon: Belva Crome, MD;  Location: [facility];  Service: Cardiovascular;  Laterality: N/A;   SHOULDER SURGERY     Rt. Shoulder   TEE WITHOUT CARDIOVERSION N/A 04/26/2019   Procedure: TRANSESOPHAGEAL ECHOCARDIOGRAM (TEE);  Surgeon: Gaye Pollack, MD;  Location: [facility];  Service: Open Heart Surgery;  Laterality: N/A;   TONSILLECTOMY     V TACH ABLATION N/A 06/22/2020   Procedure: V TACH ABLATION;  Surgeon: Vickie Epley, MD;  Location: [facility];  Service: Cardiovascular;  Laterality: N/A;    Current Medications: No outpatient medications have been marked as taking for the 09/14/22 encounter (Appointment) with Freada Bergeron, MD.     Allergies:   Doxazosin mesylate, Losartan, Testosterone, Flomax [tamsulosin hcl], Penicillins, Sulfonamide derivatives, and Trulicity [dulaglutide]   Social History   Socioeconomic History   Marital status: Married    Spouse name: Not on file   Number of children: 1   Years of education: 12   Highest  education level: High school graduate  Occupational History   Occupation: [employer]  Tobacco Use   Smoking status: Former    Types: Cigarettes    Quit date: 08/30/1983    Years since quitting: 39.0   Smokeless tobacco: Former   Tobacco comments:    Quit 30 years ago.  Vaping Use   Vaping Use: Never used  Substance and Sexual Activity   Alcohol use: Yes    Comment: rare   Drug use: No   Sexual activity: Yes  Other Topics Concern   Not on file  Social History Narrative   Not on file   Social Determinants of Health   Financial Resource Strain: Low Risk  (09/02/2021)   Overall Financial Resource Strain (CARDIA)    Difficulty of Paying Living Expenses: Not hard at all  Food Insecurity: No Food Insecurity (09/02/2021)  Hunger Vital Sign    Worried About Running Out of Food in the Last Year: Never true    Ran Out of Food in the Last Year: Never true  Transportation Needs: No Transportation Needs (09/02/2021)   PRAPARE - Hydrologist (Medical): No    Lack of Transportation (Non-Medical): No  Physical Activity: Inactive (09/02/2021)   Exercise Vital Sign    Days of Exercise per Week: 0 days    Minutes of Exercise per Session: 0 min  Stress: No Stress Concern Present (09/02/2021)   [place]    Feeling of Stress : Not at all  Social Connections: Moderately Isolated (09/02/2021)   Social Connection and Isolation Panel [NHANES]    Frequency of Communication with Friends and Family: More than three times a week    Frequency of Social Gatherings with Friends and Family: More than three times a week    Attends Religious Services: Never    Marine scientist or Organizations: No    Attends Music therapist: Never    Marital Status: Married     Family History: The patient's family history includes Allergic rhinitis in his father; Coronary artery disease in his mother; Lung cancer in his  father. There is no history of Colon cancer, Heart attack, Hypertension, Stroke, Esophageal cancer, Rectal cancer, Stomach cancer, or Pancreatic cancer.  ROS:   Please see the history of present illness.    Review of Systems  Constitutional:  Positive for malaise/fatigue.  HENT:  Positive for hearing loss.   Respiratory:  Negative for sputum production.   Cardiovascular:  Positive for leg swelling (RLE). Negative for chest pain, palpitations, orthopnea, claudication and PND.  Gastrointestinal:  Negative for nausea and vomiting.  Genitourinary:  Negative for hematuria.  Musculoskeletal:  Positive for joint pain (Right knee) and myalgias.  Neurological:  Negative for dizziness and loss of consciousness.     EKGs/Labs/Other Studies Reviewed:    The following studies were reviewed today: TTE 08/31/2022: IMPRESSIONS     1. Left ventricular ejection fraction, by estimation, is 40 to 45%. The  left ventricle has mildly decreased function. The left ventricle  demonstrates global hypokinesis. There is mild concentric left ventricular  hypertrophy. Left ventricular diastolic  parameters are consistent with Grade III diastolic dysfunction  (restrictive). Elevated left ventricular end-diastolic pressure.   2. Right ventricular systolic function is severely reduced. The right  ventricular size is moderately enlarged. There is normal pulmonary artery  systolic pressure. The estimated right ventricular systolic pressure is  69.6 mmHg.   3. Left atrial size was moderately dilated.   4. The mitral valve is degenerative. Mild mitral valve regurgitation. No  evidence of mitral stenosis. Moderate mitral annular calcification.   5. The aortic valve is tricuspid. There is severe calcifcation of the  aortic valve. There is moderate thickening of the aortic valve. Aortic  valve regurgitation is not visualized. No aortic stenosis is present.   6. The inferior vena cava is normal in size with <50%  respiratory  variability, suggesting right atrial pressure of 8 mmHg.   ICD Implant 06/25/2020:  CONCLUSIONS:  1. Ischemic cardiomyopathy with chronic New York Heart Association class II heart failure and sustained ventricular tachycardia. 2. Successful ICD implantation with a Medtronic ICD implanted for secondary prevention of sudden death.  3. No early apparent complications.   V Tach Ablation 06/22/2020:  CONCLUSIONS: 1. Sinus rhythm upon presentation  2.  Clinical VT noninducible. 3. Nonclinical VT induced and was not hemodynamically tolerated (positive precordial concordance, inferior axis, CL 327m) 4. Successful scar modification along the mid inferior wall extending to the lateral wall 5. A large inferior and inferolateral LV scar on ICE and 3d electroanatomic mapping. 6. No early apparent complications. 7. ICD discussion with the patient and family   TTE 06/20/20: IMPRESSIONS   1. Inferior and inferoseptal hypokinesis. Left ventricular ejection  fraction, by estimation, is 45 to 50%. The left ventricle has mildly  decreased function. The left ventricle demonstrates regional wall motion  abnormalities (see scoring diagram/findings   for description). There is mild concentric left ventricular hypertrophy.  Left ventricular diastolic parameters are consistent with Grade II  diastolic dysfunction (pseudonormalization).   2. Right ventricular systolic function is normal. The right ventricular  size is normal.   3. Left atrial size was moderately dilated.   4. The mitral valve is normal in structure. Trivial mitral valve  regurgitation. No evidence of mitral stenosis. Moderate mitral annular  calcification.   5. The aortic valve is calcified. There is mild calcification of the  aortic valve. There is mild thickening of the aortic valve. Aortic valve  regurgitation is not visualized. No aortic stenosis is present.   6. The inferior vena cava is dilated in size with <50%  respiratory  variability, suggesting right atrial pressure of 15 mmHg.   LHC 05/2020: Ost LAD lesion is 80% stenosed. Prox LAD to Mid LAD lesion is 90% stenosed. Mid LAD lesion is 95% stenosed. Mid RCA lesion is 85% stenosed. Mid LM to Dist LM lesion is 70% stenosed. Prox Cx to Mid Cx lesion is 85% stenosed. Mid Cx to Dist Cx lesion is 95% stenosed. Non-stenotic Prox RCA lesion. Prox Graft lesion is 20% stenosed.   Severe native multivessel CAD with 70% distal left main stenosis, 80% ostial LAD stenosis followed by segmental 90% stenoses before and after the first diagonal vessel with 95% stenosis in the midsegment proximal to a septal perforating artery with competitive filling seen distally; diffuse 80 to 95% circumflex stenoses; ectatic proximal RCA with 80% mid stenoses.   Patent LIMA graft supplying the mid LAD.   Patent SVG supplying the distal circumflex marginal vessel branch.   Patent SVG supplying the distal RCA with mild smooth 20% focal proximal to mid narrowing.   Fairly preserved global LV function with EF estimated 50 to 55%.  There is a suggestion of possible apical hypertrophy with nipple protrusion of the apex.  LVEDP 261mg.   RECOMMENDATION: The patient's bypass conduits are widely patent.  Further EP evaluation for possible defibrillator or VT ablation.  EKG: EKG is personally reviewed. 07/18/2022: EKG was not ordered. 04/13/2022: NSR at 68 bpm, RBBB 154 ms ([place] Recent Labs: 04/01/2022: Magnesium 1.7 05/10/2022: ALT 28; BUN 13; Creat 0.84; Hemoglobin 15.2; Platelets 126; Potassium 4.0; Sodium 137; TSH 1.40  Recent Lipid Panel    Component Value Date/Time   CHOL 116 05/10/2022 1105   TRIG 233 (H) 05/10/2022 1105   TRIG 193 (H) 08/08/2006 0924   HDL 61 05/10/2022 1105   CHOLHDL 1.9 05/10/2022 1105   VLDL 40 (H) 12/15/2016 0815   LDLCALC 26 05/10/2022 1105   LDLDIRECT 49.9 08/26/2013 1011          Physical Exam:    VS:  There were  no vitals taken for this visit.    Wt Readings from Last 3 Encounters:  08/11/22 212 lb (96.2 kg)  08/02/22 212 lb (96.2 kg)  07/25/22 213 lb (96.6 kg)     GEN:  Well nourished, well developed in no acute distress HEENT: Normal NECK: No JVD; No carotid bruits CARDIAC: RRR, 2/6 systolic murmur, No rubs, no gallops RESPIRATORY:  Clear to auscultation without rales, wheezing or rhonchi  ABDOMEN: Soft, non-tender, non-distended MUSCULOSKELETAL:  Warm , no edema SKIN: Warm and dry NEUROLOGIC:  Alert and oriented x 3 PSYCHIATRIC:  Normal affect   ASSESSMENT:    No diagnosis found.  PLAN:    In order of problems listed above:  #CAD s/p CABG: Patient is s/p CABG with LIMA-LAD, SVG-OM, SVG to dRCA in 03/2019. Most recent cath in 08/2019 with patent grafts. Currently doing well without anginal symptoms. Believes coreg is causing fatigue. Will trial slightly lower dose and monitor response. -Continue ASA '81mg'$  daily -Continue crestor '40mg'$  daily -Off losartan due to low blood pressures -Decrease coreg to 6.'25mg'$  in AM and 12.'5mg'$  in PM due to fatigue  #Chronic Combined Systolic and Diastolic HF: Patient with known history of combined systolic and diastolic HF. EF 2020 25-30% which improved to 45-50% in 2021 but dropped slightly to 40-45% in 07/2022. Currently, *** -Off losartan due to low blood pressures -Decrease coreg to 6.'25mg'$  in AM and 12.'5mg'$  in PM due to fatigue -Continue jardiance '10mg'$  daily -Low Na diet; monitoring weights  #RV dysfunction: Appears to be mildly to moderately depressed on personal review of TTE. Will continue with medical therapy.  #VT s/p ablation: Underwent VT ablation and ICD implantation with Dr. Quentin Ore. Doing well.  -Follow-up with EP as scheduled -Decrease coreg to 6.'25mg'$  in AM and 12.'5mg'$  in PM due to fatigue  #HTN: -Continue losartan '25mg'$  daily -Decrease coreg to 6.'25mg'$  in AM and 12.'5mg'$  in PM due to fatigue     Follow up: 6 months.  Medication  Adjustments/Labs and Tests Ordered: Current medicines are reviewed at length with the patient today.  Concerns regarding medicines are outlined above.   No orders of the defined types were placed in this encounter.  No orders of the defined types were placed in this encounter.  There are no Patient Instructions on file for this visit.     Signed, Freada Bergeron, MD  09/12/2022 2:37 PM    [place]

## 2022-09-14 ENCOUNTER — Ambulatory Visit: Payer: Medicare HMO | Attending: Cardiology | Admitting: Cardiology

## 2022-09-14 ENCOUNTER — Encounter: Payer: Self-pay | Admitting: Cardiology

## 2022-09-14 ENCOUNTER — Ambulatory Visit: Payer: Medicare HMO | Admitting: Cardiology

## 2022-09-14 VITALS — BP 106/64 | HR 63 | Ht 71.0 in | Wt 205.8 lb

## 2022-09-14 DIAGNOSIS — E782 Mixed hyperlipidemia: Secondary | ICD-10-CM | POA: Diagnosis not present

## 2022-09-14 DIAGNOSIS — I5042 Chronic combined systolic (congestive) and diastolic (congestive) heart failure: Secondary | ICD-10-CM | POA: Diagnosis not present

## 2022-09-14 DIAGNOSIS — I10 Essential (primary) hypertension: Secondary | ICD-10-CM

## 2022-09-14 DIAGNOSIS — I2510 Atherosclerotic heart disease of native coronary artery without angina pectoris: Secondary | ICD-10-CM

## 2022-09-14 DIAGNOSIS — I472 Ventricular tachycardia, unspecified: Secondary | ICD-10-CM | POA: Diagnosis not present

## 2022-09-14 NOTE — Patient Instructions (Signed)
Medication Instructions:   Your physician recommends that you continue on your current medications as directed. Please refer to the Current Medication list given to you today.  *If you need a refill on your cardiac medications before your next appointment, please call your pharmacy*   Follow-Up: At [facility], you and your health needs are our priority.  As part of our continuing mission to provide you with exceptional heart care, we have created designated Provider Care Teams.  These Care Teams include your primary Cardiologist (physician) and Advanced Practice Providers (APPs -  Physician Assistants and Nurse Practitioners) who all work together to provide you with the care you need, when you need it.  We recommend signing up for the patient portal called "MyChart".  Sign up information is provided on this After Visit Summary.  MyChart is used to connect with patients for Virtual Visits (Telemedicine).  Patients are able to view lab/test results, encounter notes, upcoming appointments, etc.  Non-urgent messages can be sent to your provider as well.   To learn more about what you can do with MyChart, go to [email or web address].    Your next appointment:   6 month(s)  Provider:   Heather E Pemberton, MD       

## 2022-09-14 NOTE — Progress Notes (Signed)
Cardiology Office Note:    Date:  09/14/2022   ID:  Dishon, Kehoe 05/20/1946, MRN [id number]  PCP:  [name], MD   [facility] Providers Cardiologist:  Freada Bergeron, MD Electrophysiologist:  Vickie Epley, MD  Advanced Heart Failure:  Glori Bickers, MD  {  Referring MD: [name], MD    History of Present Illness:    Cameron Daniel is a 77 y.o. male with a hx of CAD s/p CABG with LIMA-LAD, SVG-OM, SVG-dRCA, systolic HF with LVEF 16-60%, VT s/p ablation 05/2020 with ICD implantation, HTN, and HLD who was previously followed by Dr. Meda Coffee who now returns to clinic for follow-up.  Per review of the record, the patient had MI in 1985 with unclear intervention, treated by [name] at that time. Aortoiliac duplex 2014 showed no AAA. He did well for many years before presenting back to the ED August 2020 with fever and malaise. He was seen in the ER and given fluids and discharged but returned back with respiratory failure requiring intubation for PNA and CHF and NSTEMI. 2D echo 04/13/19 showed EF 25-30%, severe hypokinesis of the left ventricular, entire inferior wall and anteroseptal wall, mild LAE, small pericardial effusion. Covid testing was negative. Cath showed 3V CAD with low output. He was started on milrinone post-cath. He developed a large spontaneous RP bleed post cath (cath all done from arm). He ultimately underwent CABG on 04/26/19. Pre-op TEE EF 45-50%. Post CABG course was fortunately uneventful. Spironolactone was later stopped due to hyperkalemia, olmestartan stopped due to hypotension, and he followed with CHF team for a period of time before graduating from their services. He has since tolerated losartan OK. In 05/2020 he was admitted with persistent VT. He was initially treated with amiodarone but ultimately cardioverted in ED. LHC showed patent grafts without specific culprit. He was seen by EP and underwent EPS/ablation with scar  modification and ICD implantation. 2D Echo 05/2020 showed EF 45-50%, mild LVH, grade 2 DD, moderate LAE, normal RV.  Last seen in 06/2022 where he was feeling more fatigued which he attributed to his coreg. We decreased his dosing to see if this would help with symptoms.  TTE 07/2022 with LVEF 40-45%, global hypokinesis, G3DD, severe RV dysfunction with moderate RV enlargement, mild MR.  Today, the patient states that since 06/29/22 he has lost about 22 lbs unintentionally. He also complains of frequent belching regardless of eating anything; his belching temporarily resolves when he lies down at night to sleep. After eating small amounts such as half a sandwich he attains satiety. He does have a follow-up appointment with GI tomorrow to address this. Since his weight loss he also noticed his blood sugars in the morning have reduced, such as 104-106 instead of 120s-130s previously.  After Carvedilol was reduced he has felt a little more energetic. His blood pressure has been well controlled at home averaging 110-116/60s-70s. Heart rate is usually ranging 60-70 bpm.  Lately he is not exercising as much as he would like due to his myalgias and right knee pain. If he does notice any LE edema it is localized around his right knee or right ankle (has past ankle injury). The other day he was able to pedal on the exercise bike for 12 minutes and felt alright without anginal symptoms. Typically he is able to walk up a flight of stairs and walk through the grocery store without anginal symptoms.  Occasionally he will still experience anxiety for  a time. Currently he is on citalopram for PTSD and is concerned about potential medication interactions; we discussed this in detail.  He denies any palpitations, chest pain, shortness of breath, or peripheral edema. No lightheadedness, headaches, syncope, orthopnea, or PND.   Past Medical History:  Diagnosis Date   Allergic rhinitis, cause unspecified    Allergy     Anxiety    Arthritis    Cancer ([place])    a couple places removed from eyelid   Carotid Doppler 04/2020   Carotid US 9/21: No evidence of ICA stenosis bilaterally; right vertebral artery stenosis   Cataract    Chronic combined systolic (congestive) and diastolic (congestive) heart failure (HCC)    Coronary atherosclerosis of unspecified type of vessel, native or graft    a. MI 1985 with unclear details. b. CABG 03/2019 with LIMA -> LAD, SVG -> OM, SVG to dRCA.   Depression    PTSD    Diverticulosis of colon (without mention of hemorrhage)    Esophageal reflux    Esophageal stricture    Essential hypertension    Former tobacco use    Hemorrhoids    Hyperlipidemia    ICD (implantable cardioverter-defibrillator) in place    Irritable bowel syndrome    Ischemic cardiomyopathy    Mild carotid artery disease ([place])    a. 1-39% bilaterally 03/2019 duplex.   Myocardial infarction (HCC)    Nocturia    Obesity, unspecified    Orthostasis    Other acquired absence of organ    Personal history of colonic polyps    Psychosexual dysfunction with inhibited sexual excitement    Retroperitoneal bleed    Type II or unspecified type diabetes mellitus without mention of complication, not stated as uncontrolled    VT (ventricular tachycardia) ([place])     Past Surgical History:  Procedure Laterality Date   ADENOIDECTOMY     CARPAL TUNNEL RELEASE     left    CHOLECYSTECTOMY     COLON RESECTION     18 inches   COLON SURGERY  2013   CORONARY ANGIOPLASTY     1985    CORONARY ARTERY BYPASS GRAFT N/A 04/26/2019   Procedure: CORONARY ARTERY BYPASS GRAFTING (CABG) x Three, using left internal mammary artery and right leg greater saphenous vein harvested endoscopically;  Surgeon: Gaye Pollack, MD;  Location: [place];  Service: Open Heart Surgery;  Laterality: N/A;   ELBOW BURSA SURGERY     EYE SURGERY     ICD IMPLANT N/A 06/25/2020   Procedure: ICD IMPLANT;  Surgeon: Vickie Epley, MD;   Location: [place] [facility];  Service: Cardiovascular;  Laterality: N/A;   LEFT HEART CATH AND CORS/GRAFTS ANGIOGRAPHY N/A 06/19/2020   Procedure: LEFT HEART CATH AND CORS/GRAFTS ANGIOGRAPHY;  Surgeon: Troy Sine, MD;  Location: [place] [facility];  Service: Cardiovascular;  Laterality: N/A;   POLYPECTOMY     RIGHT/LEFT HEART CATH AND CORONARY ANGIOGRAPHY N/A 04/16/2019   Procedure: RIGHT/LEFT HEART CATH AND CORONARY ANGIOGRAPHY;  Surgeon: Belva Crome, MD;  Location: [place] [facility];  Service: Cardiovascular;  Laterality: N/A;   SHOULDER SURGERY     Rt. Shoulder   TEE WITHOUT CARDIOVERSION N/A 04/26/2019   Procedure: TRANSESOPHAGEAL ECHOCARDIOGRAM (TEE);  Surgeon: Gaye Pollack, MD;  Location: [place];  Service: Open Heart Surgery;  Laterality: N/A;   TONSILLECTOMY     V TACH ABLATION N/A 06/22/2020   Procedure: V TACH ABLATION;  Surgeon: Vickie Epley,  MD;  Location: [place] [facility];  Service: Cardiovascular;  Laterality: N/A;    Current Medications: Current Meds  Medication Sig   acetaminophen (TYLENOL) 500 MG tablet Take 1,000 mg by mouth at bedtime as needed for mild pain.   aspirin EC 81 MG tablet Take 1 tablet (81 mg total) by mouth daily.   azelastine (ASTELIN) 0.1 % nasal spray Place 2 sprays into both nostrils 2 (two) times daily. Use in each nostril as directed   carvedilol (COREG) 12.5 MG tablet Take 1 tablet (12.5 mg total) by mouth 2 (two) times daily with a meal.   Cetirizine HCl (ZERVIATE) 0.24 % SOLN Apply 1 drop to eye 2 (two) times daily.   citalopram (CELEXA) 10 MG tablet TAKE 1 TABLET BY MOUTH EVERY DAY   colestipol (COLESTID) 5 g packet Take 5 g by mouth daily.    COMIRNATY SUSP injection    diphenoxylate-atropine (LOMOTIL) 2.5-0.025 MG tablet Take 2 tablets by mouth 4 (four) times daily as needed for diarrhea or loose stools.   empagliflozin (JARDIANCE) 25 MG TABS tablet TAKE 25 MG (1 TABLET) BY MOUTH DAILY BEFORE BREAKFAST. STOP PIOGLITAZONE    famotidine (PEPCID) 20 MG tablet Take 1 tablet (20 mg total) by mouth at bedtime.   fexofenadine (ALLEGRA) 180 MG tablet Take 1 tablet (180 mg total) by mouth daily as needed. For allergies   finasteride (PROSCAR) 5 MG tablet TAKE 1 TABLET BY MOUTH EVERY DAY   loteprednol (LOTEMAX) 0.5 % ophthalmic suspension Place 1 drop into both eyes 4 (four) times daily as needed (itching).   magnesium oxide (MAG-OX) 400 MG tablet Take 1 tablet (400 mg total) by mouth daily.   metoCLOPramide (REGLAN) 10 MG tablet Take 1 tablet (10 mg total) by mouth 3 (three) times daily before meals.   montelukast (SINGULAIR) 10 MG tablet TAKE 1 TABLET BY MOUTH EVERYDAY AT BEDTIME   nystatin cream (MYCOSTATIN) Apply 1 Application topically 3 (three) times daily.   pantoprazole (PROTONIX) 40 MG tablet Take 1 tablet (40 mg total) by mouth 2 (two) times daily.   rosuvastatin (CRESTOR) 40 MG tablet TAKE 1 TABLET BY MOUTH DAILY AT 6 PM.   sitaGLIPtin (JANUVIA) 50 MG tablet Take 1 tablet (50 mg total) by mouth daily.   vitamin B-12 (CYANOCOBALAMIN) 100 MCG tablet Take 100 mcg by mouth daily.   [DISCONTINUED] losartan (COZAAR) 25 MG tablet Take 1 tablet (25 mg total) by mouth at bedtime.     Allergies:   Doxazosin mesylate, Losartan, Testosterone, Flomax [tamsulosin hcl], Penicillins, Sulfonamide derivatives, and Trulicity [dulaglutide]   Social History   Socioeconomic History   Marital status: Married    Spouse name: Not on file   Number of children: 1   Years of education: 12   Highest education level: High school graduate  Occupational History   Occupation: [employer]  Tobacco Use   Smoking status: Former    Types: Cigarettes    Quit date: 08/30/1983    Years since quitting: 39.0   Smokeless tobacco: Former   Tobacco comments:    Quit 30 years ago.  Vaping Use   Vaping Use: Never used  Substance and Sexual Activity   Alcohol use: Yes    Comment: rare   Drug use: No   Sexual activity: Yes  Other Topics Concern    Not on file  Social History Narrative   Not on file   Social Determinants of Health   Financial Resource Strain: Low Risk  (09/02/2021)  Overall Financial Resource Strain (CARDIA)    Difficulty of Paying Living Expenses: Not hard at all  Food Insecurity: No Food Insecurity (09/02/2021)   Hunger Vital Sign    Worried About Running Out of Food in the Last Year: Never true    Ran Out of Food in the Last Year: Never true  Transportation Needs: No Transportation Needs (09/02/2021)   PRAPARE - Hydrologist (Medical): No    Lack of Transportation (Non-Medical): No  Physical Activity: Inactive (09/02/2021)   Exercise Vital Sign    Days of Exercise per Week: 0 days    Minutes of Exercise per Session: 0 min  Stress: No Stress Concern Present (09/02/2021)   [place]    Feeling of Stress : Not at all  Social Connections: Moderately Isolated (09/02/2021)   Social Connection and Isolation Panel [NHANES]    Frequency of Communication with Friends and Family: More than three times a week    Frequency of Social Gatherings with Friends and Family: More than three times a week    Attends Religious Services: Never    Marine scientist or Organizations: No    Attends Music therapist: Never    Marital Status: Married     Family History: The patient's family history includes Allergic rhinitis in his father; Coronary artery disease in his mother; Lung cancer in his father. There is no history of Colon cancer, Heart attack, Hypertension, Stroke, Esophageal cancer, Rectal cancer, Stomach cancer, or Pancreatic cancer.  ROS:   Please see the history of present illness.    Review of Systems  Constitutional:  Positive for malaise/fatigue and weight loss. Negative for chills and fever.  HENT:  Positive for hearing loss. Negative for ear pain.   Eyes:  Negative for photophobia.  Respiratory:  Negative  for sputum production.   Cardiovascular:  Positive for leg swelling (RLE). Negative for chest pain, palpitations, orthopnea, claudication and PND.  Gastrointestinal:  Negative for nausea and vomiting.  Genitourinary:  Negative for hematuria.  Musculoskeletal:  Positive for joint pain (Right knee) and myalgias.  Neurological:  Negative for dizziness and loss of consciousness.  Psychiatric/Behavioral:  Negative for depression.      EKGs/Labs/Other Studies Reviewed:    The following studies were reviewed today:  TTE 07/2022: IMPRESSIONS   1. Left ventricular ejection fraction, by estimation, is 40 to 45%. The  left ventricle has mildly decreased function. The left ventricle  demonstrates global hypokinesis. There is mild concentric left ventricular  hypertrophy. Left ventricular diastolic  parameters are consistent with Grade III diastolic dysfunction  (restrictive). Elevated left ventricular end-diastolic pressure.   2. Right ventricular systolic function is severely reduced. The right  ventricular size is moderately enlarged. There is normal pulmonary artery  systolic pressure. The estimated right ventricular systolic pressure is  16.1 mmHg.   3. Left atrial size was moderately dilated.   4. The mitral valve is degenerative. Mild mitral valve regurgitation. No  evidence of mitral stenosis. Moderate mitral annular calcification.   5. The aortic valve is tricuspid. There is severe calcifcation of the  aortic valve. There is moderate thickening of the aortic valve. Aortic  valve regurgitation is not visualized. No aortic stenosis is present.   6. The inferior vena cava is normal in size with <50% respiratory  variability, suggesting right atrial pressure of 8 mmHg.   ICD Implant 06/25/2020:  CONCLUSIONS:  1. Ischemic cardiomyopathy with  chronic New York Heart Association class II heart failure and sustained ventricular tachycardia. 2. Successful ICD implantation with a Medtronic ICD  implanted for secondary prevention of sudden death.  3. No early apparent complications.   V Tach Ablation 06/22/2020:  CONCLUSIONS: 1. Sinus rhythm upon presentation  2. Clinical VT noninducible. 3. Nonclinical VT induced and was not hemodynamically tolerated (positive precordial concordance, inferior axis, CL 373m) 4. Successful scar modification along the mid inferior wall extending to the lateral wall 5. A large inferior and inferolateral LV scar on ICE and 3d electroanatomic mapping. 6. No early apparent complications. 7. ICD discussion with the patient and family   TTE 06/20/20: IMPRESSIONS   1. Inferior and inferoseptal hypokinesis. Left ventricular ejection  fraction, by estimation, is 45 to 50%. The left ventricle has mildly  decreased function. The left ventricle demonstrates regional wall motion  abnormalities (see scoring diagram/findings   for description). There is mild concentric left ventricular hypertrophy.  Left ventricular diastolic parameters are consistent with Grade II  diastolic dysfunction (pseudonormalization).   2. Right ventricular systolic function is normal. The right ventricular  size is normal.   3. Left atrial size was moderately dilated.   4. The mitral valve is normal in structure. Trivial mitral valve  regurgitation. No evidence of mitral stenosis. Moderate mitral annular  calcification.   5. The aortic valve is calcified. There is mild calcification of the  aortic valve. There is mild thickening of the aortic valve. Aortic valve  regurgitation is not visualized. No aortic stenosis is present.   6. The inferior vena cava is dilated in size with <50% respiratory  variability, suggesting right atrial pressure of 15 mmHg.   LHC 05/2020: Ost LAD lesion is 80% stenosed. Prox LAD to Mid LAD lesion is 90% stenosed. Mid LAD lesion is 95% stenosed. Mid RCA lesion is 85% stenosed. Mid LM to Dist LM lesion is 70% stenosed. Prox Cx to Mid Cx lesion is  85% stenosed. Mid Cx to Dist Cx lesion is 95% stenosed. Non-stenotic Prox RCA lesion. Prox Graft lesion is 20% stenosed.   Severe native multivessel CAD with 70% distal left main stenosis, 80% ostial LAD stenosis followed by segmental 90% stenoses before and after the first diagonal vessel with 95% stenosis in the midsegment proximal to a septal perforating artery with competitive filling seen distally; diffuse 80 to 95% circumflex stenoses; ectatic proximal RCA with 80% mid stenoses.   Patent LIMA graft supplying the mid LAD.   Patent SVG supplying the distal circumflex marginal vessel branch.   Patent SVG supplying the distal RCA with mild smooth 20% focal proximal to mid narrowing.   Fairly preserved global LV function with EF estimated 50 to 55%.  There is a suggestion of possible apical hypertrophy with nipple protrusion of the apex.  LVEDP 277mg.   RECOMMENDATION: The patient's bypass conduits are widely patent.  Further EP evaluation for possible defibrillator or VT ablation.  EKG: EKG is personally reviewed. 09/14/2022:  EKG was not ordered. 07/18/2022: EKG was not ordered. 04/13/2022: NSR at 68 bpm, RBBB 154 ms ([place] Recent Labs: 04/01/2022: Magnesium 1.7 05/10/2022: ALT 28; BUN 13; Creat 0.84; Hemoglobin 15.2; Platelets 126; Potassium 4.0; Sodium 137; TSH 1.40   Recent Lipid Panel    Component Value Date/Time   CHOL 116 05/10/2022 1105   TRIG 233 (H) 05/10/2022 1105   TRIG 193 (H) 08/08/2006 0924   HDL 61 05/10/2022 1105   CHOLHDL 1.9 05/10/2022 1105   VLDL  40 (H) 12/15/2016 0815   LDLCALC 26 05/10/2022 1105   LDLDIRECT 49.9 08/26/2013 1011         Physical Exam:    VS:  BP 106/64   Pulse 63   Ht '5\' 11"'$  (1.803 m)   Wt 205 lb 12.8 oz (93.4 kg)   SpO2 95%   BMI 28.70 kg/m     Wt Readings from Last 3 Encounters:  09/14/22 205 lb 12.8 oz (93.4 kg)  08/11/22 212 lb (96.2 kg)  08/02/22 212 lb (96.2 kg)     GEN:  Well nourished, well  developed in no acute distress HEENT: Normal NECK: No JVD; No carotid bruits CARDIAC: RRR, 2/6 systolic murmur, No rubs, no gallops RESPIRATORY:  Clear to auscultation without rales, wheezing or rhonchi  ABDOMEN: Soft, non-tender, non-distended MUSCULOSKELETAL:  Warm , no edema SKIN: Warm and dry NEUROLOGIC:  Alert and oriented x 3 PSYCHIATRIC:  Normal affect   ASSESSMENT:    1. Coronary artery disease involving native coronary artery of native heart without angina pectoris   2. CAD in native artery   3. Chronic combined systolic and diastolic CHF (congestive heart failure) ([place])   4. VT (ventricular tachycardia) ([place])   5. Mixed hyperlipidemia   6. Essential hypertension   7. Ventricular tachycardia (HCC)     PLAN:    In order of problems listed above:  #CAD s/p CABG: Patient is s/p CABG with LIMA-LAD, SVG-OM, SVG to dRCA in 03/2019. Most recent cath in 08/2019 with patent grafts. Currently doing well without anginal symto -Continue ASA '81mg'$  daily -Continue crestor '40mg'$  daily -Continue losartan '25mg'$  daily -Decrease coreg to 6.'25mg'$  in AM and 12.'5mg'$  in PM due to fatigue  #Chronic Combined Systolic and Diastolic HF: Patient with known history of combined systolic and diastolic HF. EF 2020 25-30% which improved to 45-50% in 2021 but dropped slightly to 40-45% in 07/2022. Currently, he is euvolemic and compensated on exam. Denies anginal symptoms. Did not tolerate losartan due to low blood pressure.  -Continue coreg 6.'25mg'$  in AM and 12.'5mg'$  in PM due to fatigue -Continue jardiance '10mg'$  daily -Off losartan due to hypotension -Low Na diet; monitoring weights  #RV dysfunction: Appears to be mildly to moderately depressed on personal review of TTE. Will continue with medical therapy.  #VT s/p ablation: Underwent VT ablation and ICD implantation with Dr. Quentin Ore. Doing well.  -Follow-up with EP as scheduled -Continue 6.'25mg'$  in AM and 12.'5mg'$  in PM (on reduced dose to due  fatigue)  #HTN: Blood pressure mainly 110s at home -Continue coreg 6.'25mg'$  in AM and 12.'5mg'$  in PM as above -Off losartan due to hypotension  #Weight Loss: Patient reports 20lb weight loss over the past several months with early satiety. Planned to see GI tomorrow. -Follow-up with GI as scheduled -Patient able to complete >4METs of activity without anginal symptoms and therefore does not need stress testing if planned for EGD/[place]     Follow up:  6 months.  Medication Adjustments/Labs and Tests Ordered: Current medicines are reviewed at length with the patient today.  Concerns regarding medicines are outlined above.   No orders of the defined types were placed in this encounter.  No orders of the defined types were placed in this encounter.  Patient Instructions  Medication Instructions:   Your physician recommends that you continue on your current medications as directed. Please refer to the Current Medication list given to you today.  *If you need a refill on your cardiac medications before your next  appointment, please call your pharmacy*   Follow-Up: At [facility], you and your health needs are our priority.  As part of our continuing mission to provide you with exceptional heart care, we have created designated Provider Care Teams.  These Care Teams include your primary Cardiologist (physician) and Advanced Practice Providers (APPs -  Physician Assistants and Nurse Practitioners) who all work together to provide you with the care you need, when you need it.  We recommend signing up for the patient portal called "MyChart".  Sign up information is provided on this After Visit Summary.  MyChart is used to connect with patients for Virtual Visits (Telemedicine).  Patients are able to view lab/test results, encounter notes, upcoming appointments, etc.  Non-urgent messages can be sent to your provider as well.   To learn more about what you can do with MyChart, go to  [email or web address].    Your next appointment:   6 month(s)  Provider:   Freada Bergeron, MD       [facility] Stumpf,acting as a scribe for Freada Bergeron, MD.,have documented all relevant documentation on the behalf of Freada Bergeron, MD,as directed by  Freada Bergeron, MD while in the presence of Freada Bergeron, MD.   I, Freada Bergeron, MD, have reviewed all documentation for this visit. The documentation on 09/14/22 for the exam, diagnosis, procedures, and orders are all accurate and complete.   Signed, Freada Bergeron, MD  09/14/2022 2:49 PM    [place]

## 2022-09-15 ENCOUNTER — Encounter: Payer: Self-pay | Admitting: Internal Medicine

## 2022-09-15 ENCOUNTER — Ambulatory Visit: Payer: Medicare HMO | Admitting: Internal Medicine

## 2022-09-15 VITALS — HR 60 | Ht 71.0 in | Wt 205.0 lb

## 2022-09-15 DIAGNOSIS — R140 Abdominal distension (gaseous): Secondary | ICD-10-CM

## 2022-09-15 DIAGNOSIS — K641 Second degree hemorrhoids: Secondary | ICD-10-CM

## 2022-09-15 DIAGNOSIS — R1310 Dysphagia, unspecified: Secondary | ICD-10-CM

## 2022-09-15 DIAGNOSIS — R194 Change in bowel habit: Secondary | ICD-10-CM

## 2022-09-15 DIAGNOSIS — R634 Abnormal weight loss: Secondary | ICD-10-CM | POA: Diagnosis not present

## 2022-09-15 MED ORDER — HYDROCORTISONE (PERIANAL) 2.5 % EX CREA: 1.0000 | TOPICAL_CREAM | Freq: Every day | CUTANEOUS | 1 refills | Status: DC

## 2022-09-15 MED ORDER — METRONIDAZOLE 250 MG PO TABS: 250.0000 mg | ORAL_TABLET | Freq: Three times a day (TID) | ORAL | 0 refills | Status: DC

## 2022-09-15 NOTE — Patient Instructions (Addendum)
_______________________________________________________  If your blood pressure at your visit was 140/90 or greater, please contact your primary care physician to follow up on this.  _______________________________________________________  If you are age 76 or older, your body mass index should be between 23-30. Your Body mass index is 28.59 kg/m. If this is out of the aforementioned range listed, please consider follow up with your Primary Care Provider.  If you are age 62 or younger, your body mass index should be between 19-25. Your Body mass index is 28.59 kg/m. If this is out of the aformentioned range listed, please consider follow up with your Primary Care Provider.   ________________________________________________________  The [facility] providers would like to encourage you to use [facility] to communicate with providers for non-urgent requests or questions.  Due to long hold times on the telephone, sending your provider a message by [facility] may be a faster and more efficient way to get a response.  Please allow 48 business hours for a response.  Please remember that this is for non-urgent requests.  _______________________________________________________  We have sent the following medications to your pharmacy for you to pick up at your convenience:  Flagyl, Anusol cream  Purchase Preparation H suppositories over the counter. Place a small amount of the cream on the suppository and insert rectally at bedtime.   Take 2 tablespoons of Citrucel in water or juice daily

## 2022-09-15 NOTE — Progress Notes (Addendum)
HISTORY OF PRESENT ILLNESS:  Cameron Daniel is a 77 y.o. male with multiple medical problems as listed below.  He has a history of complicated diverticular disease with segmental colonic resection, history of multiple adenomatous colon polyps, and GERD.  He has had healthcare here as well as the [facility] system.  I last saw the patient January 2023 regarding increased intestinal gas, alternating bowel habits, and the need for surveillance colonoscopy.  See dictation.  He subsequently underwent complete colonoscopy March 2023.  He was found to have polyps and diverticulosis.  Follow-up in 3 years recommended.  He presents today regarding a 66-monthhistory of increased issues with belching, early satiety, and associated weight loss of 20 pounds.  He was seen by the GI physician assistant June 27, 2022 and his PCP August 11, 2022.  See those dictations.  He tells me that he continues to have increased problems with burping.  No abdominal pain.  Less so at night when lying flat.  Continues to describe early satiety with associated decreased appetite.  His weight has been stable over the past week.  He does describe some mild solid food dysphagia.  He reports that his bowel habits tend alternate somewhat.  He will describe having had "a blowout" followed by diarrhea for few days, then constipation.  He does use Colace as needed.  He did try probiotic align for 2 months.  This did not help.  He tells me that his diabetes is under better control on Jardiance he takes pantoprazole for GERD.  No active GERD symptoms.  Review of blood work from May 10, 2022 shows normal CBC with hemoglobin 15.2.  Abdominal films from August 02, 2022 are unremarkable.  CT scan March 2021 is without acute findings  REVIEW OF SYSTEMS:  All non-GI ROS negative otherwise stated in the HPI except for sinus allergy, anxiety, arthritis, fatigue, hearing problems  Past Medical History:  Diagnosis Date   Allergic  rhinitis, cause unspecified    Allergy    Anxiety    Arthritis    Cancer ([place]    a couple places removed from eyelid   Carotid Doppler 04/2020   Carotid [place]: No evidence of ICA stenosis bilaterally; right vertebral artery stenosis   Cataract    Chronic combined systolic (congestive) and diastolic (congestive) heart failure (HCC)    Coronary atherosclerosis of unspecified type of vessel, native or graft    a. MI 1985 with unclear details. b. CABG 03/2019 with LIMA -> LAD, SVG -> OM, SVG to dRCA.   Depression    PTSD    Diverticulosis of colon (without mention of hemorrhage)    Esophageal reflux    Esophageal stricture    Essential hypertension    Former tobacco use    Hemorrhoids    Hyperlipidemia    ICD (implantable cardioverter-defibrillator) in place    Irritable bowel syndrome    Ischemic cardiomyopathy    Mild carotid artery disease ([place]    a. 1-39% bilaterally 03/2019 duplex.   Myocardial infarction (HCC)    Nocturia    Obesity, unspecified    Orthostasis    Other acquired absence of organ    Personal history of colonic polyps    Psychosexual dysfunction with inhibited sexual excitement    Retroperitoneal bleed    Type II or unspecified type diabetes mellitus without mention of complication, not stated as uncontrolled    VT (ventricular tachycardia) ([place]     Past Surgical History:  Procedure Laterality  Date   ADENOIDECTOMY     CARPAL TUNNEL RELEASE     left    CHOLECYSTECTOMY     COLON RESECTION     18 inches   COLON SURGERY  2013   CORONARY ANGIOPLASTY     1985    CORONARY ARTERY BYPASS GRAFT N/A 04/26/2019   Procedure: CORONARY ARTERY BYPASS GRAFTING (CABG) x Three, using left internal mammary artery and right leg greater saphenous vein harvested endoscopically;  Surgeon: Gaye Pollack, MD;  Location: [facility];  Service: Open Heart Surgery;  Laterality: N/A;   ELBOW BURSA SURGERY     EYE SURGERY     ICD IMPLANT N/A 06/25/2020   Procedure: ICD IMPLANT;   Surgeon: Vickie Epley, MD;  Location: [facility];  Service: Cardiovascular;  Laterality: N/A;   LEFT HEART CATH AND CORS/GRAFTS ANGIOGRAPHY N/A 06/19/2020   Procedure: LEFT HEART CATH AND CORS/GRAFTS ANGIOGRAPHY;  Surgeon: Troy Sine, MD;  Location: [place] [facility];  Service: Cardiovascular;  Laterality: N/A;   POLYPECTOMY     RIGHT/LEFT HEART CATH AND CORONARY ANGIOGRAPHY N/A 04/16/2019   Procedure: RIGHT/LEFT HEART CATH AND CORONARY ANGIOGRAPHY;  Surgeon: Belva Crome, MD;  Location: [place] [facility];  Service: Cardiovascular;  Laterality: N/A;   SHOULDER SURGERY     Rt. Shoulder   TEE WITHOUT CARDIOVERSION N/A 04/26/2019   Procedure: TRANSESOPHAGEAL ECHOCARDIOGRAM (TEE);  Surgeon: Gaye Pollack, MD;  Location: [place];  Service: Open Heart Surgery;  Laterality: N/A;   TONSILLECTOMY     V TACH ABLATION N/A 06/22/2020   Procedure: V TACH ABLATION;  Surgeon: Vickie Epley, MD;  Location: [place] [facility];  Service: Cardiovascular;  Laterality: N/A;    Social History Cameron Daniel  reports that he quit smoking about 39 years ago. His smoking use included cigarettes. He has quit using smokeless tobacco. He reports current alcohol use. He reports that he does not use drugs.  family history includes Allergic rhinitis in his father; Coronary artery disease in his mother; Lung cancer in his father.  Allergies  Allergen Reactions   Doxazosin Mesylate Other (See Comments)    Drops in blood pressure    Losartan     Other reaction(s): Muscle pain   Testosterone     Other reaction(s): Erythrocytosis Other reaction(s): Erythrocytosis   Flomax [Tamsulosin Hcl] Itching and Other (See Comments)    Drops in blood pressure     Penicillins Rash and Other (See Comments)    Has patient had a PCN reaction causing immediate rash, facial/tongue/throat swelling, SOB or lightheadedness with hypotension: No Has patient had a PCN reaction causing severe rash involving mucus  membranes or skin necrosis: No Has patient had a PCN reaction that required hospitalization No Has patient had a PCN reaction occurring within the last 10 years: No If all of the above answers are "NO", then may proceed with Cephalosporin use.    Sulfonamide Derivatives Rash   Trulicity [Dulaglutide] Nausea And Vomiting    N&V, Dizziness       PHYSICAL EXAMINATION: Vital signs: Pulse 60   Ht '5\' 11"'$  (1.803 m)   Wt 205 lb (93 kg)   BMI 28.59 kg/m   Constitutional: generally well-appearing, no acute distress Psychiatric: alert and oriented x3, cooperative Eyes: extraocular movements intact, anicteric, conjunctiva pink Mouth: oral pharynx moist, no lesions Neck: supple no lymphadenopathy Cardiovascular: heart regular rate and rhythm, no murmur Lungs: clear to auscultation bilaterally Abdomen: soft, nontender, nondistended,  no obvious ascites, no peritoneal signs, normal bowel sounds, no organomegaly.  Previous surgical incisions well-healed.  Abdominal diastases Rectal: Omitted Extremities: no clubbing, cyanosis.  Trace lower extremity edema bilaterally Skin: no lesions on visible extremities Neuro: No focal deficits.  Cranial nerves intact  ASSESSMENT:  1.  Intestinal bloating with early satiety and weight loss.  Rule out gastroparesis, rule obstructing lesion, rule out bacterial overgrowth. 2.  Intermittent dysphagia.  Rule out stricture.  Rule out neoplasia 3.  GERD.  On PPI 4.  History of complicated diverticular disease requiring surgery. 5.  History of multiple adenomatous polyps.  Surveillance up-to-date 6.  Multiple significant medical problems including diabetes and multiple cardiac issues.  Clinically stable 7.  Alternating bowel habits  PLAN:  1.  Prescribe metronidazole 250 mg p.o. 3 times daily for 10 days for possible bacterial overgrowth 2.  Citrucel 2 tablespoons daily for alternating bowel habits 3.  Schedule upper endoscopy possible esophageal dilation.   Patient is higher than baseline risk due to his age and comorbidities.The nature of the procedure, as well as the risks, benefits, and alternatives were carefully and thoroughly reviewed with the patient. Ample time for discussion and questions allowed. The patient understood, was satisfied, and agreed to proceed. 4.  Adjust diabetic medications preprocedure 5.  Depending upon the outcome of the above, consider CT scan of the abdomen pelvis to evaluate abdominal complaints and weight loss. 6.  Prescribed Anusol HC suppository (generic equivalent).  Total time of 40 minutes was spent preparing to see the patient, reviewing the myriad of data, obtaining comprehensive history, performing medically appropriate physical examination, counseling and educating the patient regarding the above listed issues, ordering medication, ordering advanced endoscopic procedure, and documenting clinical information in the health record.

## 2022-09-20 DIAGNOSIS — E119 Type 2 diabetes mellitus without complications: Secondary | ICD-10-CM | POA: Diagnosis not present

## 2022-09-22 ENCOUNTER — Ambulatory Visit (INDEPENDENT_AMBULATORY_CARE_PROVIDER_SITE_OTHER): Payer: Medicare HMO

## 2022-09-22 DIAGNOSIS — I472 Ventricular tachycardia, unspecified: Secondary | ICD-10-CM

## 2022-09-22 LAB — CUP PACEART REMOTE DEVICE CHECK
Battery Remaining Longevity: 123 mo
Battery Voltage: 3.02 V
Brady Statistic RV Percent Paced: 0.04 %
Date Time Interrogation Session: 20240125043725
HighPow Impedance: 70 Ohm
Implantable Lead Connection Status: 753985
Implantable Lead Implant Date: 20211028
Implantable Lead Location: 753860
Implantable Pulse Generator Implant Date: 20211028
Lead Channel Impedance Value: 380 Ohm
Lead Channel Impedance Value: 456 Ohm
Lead Channel Pacing Threshold Amplitude: 0.625 V
Lead Channel Pacing Threshold Pulse Width: 0.4 ms
Lead Channel Sensing Intrinsic Amplitude: 13.75 mV
Lead Channel Sensing Intrinsic Amplitude: 13.75 mV
Lead Channel Setting Pacing Amplitude: 2 V
Lead Channel Setting Pacing Pulse Width: 0.4 ms
Lead Channel Setting Sensing Sensitivity: 0.3 mV
Zone Setting Status: 755011
Zone Setting Status: 755011

## 2022-09-23 ENCOUNTER — Other Ambulatory Visit: Payer: Self-pay | Admitting: Nurse Practitioner

## 2022-09-23 ENCOUNTER — Telehealth: Payer: Self-pay | Admitting: Internal Medicine

## 2022-09-23 NOTE — Telephone Encounter (Signed)
Patient called said the Flagyl medication has him constipated and would like to know if he can take Miralax.

## 2022-09-23 NOTE — Telephone Encounter (Signed)
Spoke with pt and let him know it is fine for him  to take miralax.

## 2022-09-27 ENCOUNTER — Ambulatory Visit: Payer: Medicare HMO | Admitting: Internal Medicine

## 2022-09-30 DIAGNOSIS — M1711 Unilateral primary osteoarthritis, right knee: Secondary | ICD-10-CM | POA: Diagnosis not present

## 2022-10-04 NOTE — Patient Instructions (Incomplete)
[name] , Thank you for taking time to come for your Medicare Wellness Visit. I appreciate your ongoing commitment to your health goals. Please review the following plan we discussed and let me know if I can assist you in the future.   These are the goals we discussed:  Goals      Exercise 3x per week (30 min per time)     Increase exercise as tolerated. Get boat fixed.         This is a list of the screening recommended for you and due dates:  Health Maintenance  Topic Date Due   Yearly kidney health urinalysis for diabetes  Never done   Complete foot exam   05/05/2021   COVID-19 Vaccine (7 - 2023-24 season) 08/09/2022   Medicare Annual Wellness Visit  09/02/2022   Eye exam for diabetics  09/10/2022   Hepatitis C Screening: USPSTF Recommendation to screen - Ages 18-79 yo.  08/03/2023*   Hemoglobin A1C  11/08/2022   Yearly kidney function blood test for diabetes  05/11/2023   Cologuard (Stool DNA test)  06/08/2024   Pneumonia Vaccine  Completed   Flu Shot  Completed   Zoster (Shingles) Vaccine  Completed   HPV Vaccine  Aged Out   DTaP/Tdap/Td vaccine  Discontinued   Colon Cancer Screening  Discontinued  *Topic was postponed. The date shown is not the original due date.    Advanced directives: ***  Conditions/risks identified: Aim for 30 minutes of exercise or brisk walking, 6-8 glasses of water, and 5 servings of fruits and vegetables each day.   Next appointment: Follow up in one year for your annual wellness visit.   Preventive Care 63 Years and Older, Male  Preventive care refers to lifestyle choices and visits with your health care provider that can promote health and wellness. What does preventive care include? A yearly physical exam. This is also called an annual well check. Dental exams once or twice a year. Routine eye exams. Ask your health care provider how often you should have your eyes checked. Personal lifestyle choices, including: Daily care of your  teeth and gums. Regular physical activity. Eating a healthy diet. Avoiding tobacco and drug use. Limiting alcohol use. Practicing safe sex. Taking low doses of aspirin every day. Taking vitamin and mineral supplements as recommended by your health care provider. What happens during an annual well check? The services and screenings done by your health care provider during your annual well check will depend on your age, overall health, lifestyle risk factors, and family history of disease. Counseling  Your health care provider may ask you questions about your: Alcohol use. Tobacco use. Drug use. Emotional well-being. Home and relationship well-being. Sexual activity. Eating habits. History of falls. Memory and ability to understand (cognition). Work and work Statistician. Screening  You may have the following tests or measurements: Height, weight, and BMI. Blood pressure. Lipid and cholesterol levels. These may be checked every 5 years, or more frequently if you are over 4 years old. Skin check. Lung cancer screening. You may have this screening every year starting at age 37 if you have a 30-pack-year history of smoking and currently smoke or have quit within the past 15 years. Fecal occult blood test (FOBT) of the stool. You may have this test every year starting at age 59. Flexible sigmoidoscopy or colonoscopy. You may have a sigmoidoscopy every 5 years or a colonoscopy every 10 years starting at age 23. Prostate cancer screening. Recommendations will  vary depending on your family history and other risks. Hepatitis C blood test. Hepatitis B blood test. Sexually transmitted disease (STD) testing. Diabetes screening. This is done by checking your blood sugar (glucose) after you have not eaten for a while (fasting). You may have this done every 1-3 years. Abdominal aortic aneurysm (AAA) screening. You may need this if you are a current or former smoker. Osteoporosis. You may be  screened starting at age 42 if you are at high risk. Talk with your health care provider about your test results, treatment options, and if necessary, the need for more tests. Vaccines  Your health care provider may recommend certain vaccines, such as: Influenza vaccine. This is recommended every year. Tetanus, diphtheria, and acellular pertussis (Tdap, Td) vaccine. You may need a Td booster every 10 years. Zoster vaccine. You may need this after age 110. Pneumococcal 13-valent conjugate (PCV13) vaccine. One dose is recommended after age 13. Pneumococcal polysaccharide (PPSV23) vaccine. One dose is recommended after age 12. Talk to your health care provider about which screenings and vaccines you need and how often you need them. This information is not intended to replace advice given to you by your health care provider. Make sure you discuss any questions you have with your health care provider. Document Released: 09/11/2015 Document Revised: 05/04/2016 Document Reviewed: 06/16/2015 Elsevier Interactive Patient Education  2017 [employer] Prevention in the Home Falls can cause injuries. They can happen to people of all ages. There are many things you can do to make your home safe and to help prevent falls. What can I do on the outside of my home? Regularly fix the edges of walkways and driveways and fix any cracks. Remove anything that might make you trip as you walk through a door, such as a raised step or threshold. Trim any bushes or trees on the path to your home. Use bright outdoor lighting. Clear any walking paths of anything that might make someone trip, such as rocks or tools. Regularly check to see if handrails are loose or broken. Make sure that both sides of any steps have handrails. Any raised decks and porches should have guardrails on the edges. Have any leaves, snow, or ice cleared regularly. Use sand or salt on walking paths during winter. Clean up any spills in  your garage right away. This includes oil or grease spills. What can I do in the bathroom? Use night lights. Install grab bars by the toilet and in the tub and shower. Do not use towel bars as grab bars. Use non-skid mats or decals in the tub or shower. If you need to sit down in the shower, use a plastic, non-slip stool. Keep the floor dry. Clean up any water that spills on the floor as soon as it happens. Remove soap buildup in the tub or shower regularly. Attach bath mats securely with double-sided non-slip rug tape. Do not have throw rugs and other things on the floor that can make you trip. What can I do in the bedroom? Use night lights. Make sure that you have a light by your bed that is easy to reach. Do not use any sheets or blankets that are too big for your bed. They should not hang down onto the floor. Have a firm chair that has side arms. You can use this for support while you get dressed. Do not have throw rugs and other things on the floor that can make you trip. What can I do in  the kitchen? Clean up any spills right away. Avoid walking on wet floors. Keep items that you use a lot in easy-to-reach places. If you need to reach something above you, use a strong step stool that has a grab bar. Keep electrical cords out of the way. Do not use floor polish or wax that makes floors slippery. If you must use wax, use non-skid floor wax. Do not have throw rugs and other things on the floor that can make you trip. What can I do with my stairs? Do not leave any items on the stairs. Make sure that there are handrails on both sides of the stairs and use them. Fix handrails that are broken or loose. Make sure that handrails are as long as the stairways. Check any carpeting to make sure that it is firmly attached to the stairs. Fix any carpet that is loose or worn. Avoid having throw rugs at the top or bottom of the stairs. If you do have throw rugs, attach them to the floor with carpet  tape. Make sure that you have a light switch at the top of the stairs and the bottom of the stairs. If you do not have them, ask someone to add them for you. What else can I do to help prevent falls? Wear shoes that: Do not have high heels. Have rubber bottoms. Are comfortable and fit you well. Are closed at the toe. Do not wear sandals. If you use a stepladder: Make sure that it is fully opened. Do not climb a closed stepladder. Make sure that both sides of the stepladder are locked into place. Ask someone to hold it for you, if possible. Clearly mark and make sure that you can see: Any grab bars or handrails. First and last steps. Where the edge of each step is. Use tools that help you move around (mobility aids) if they are needed. These include: Canes. Walkers. Scooters. Crutches. Turn on the lights when you go into a dark area. Replace any light bulbs as soon as they burn out. Set up your furniture so you have a clear path. Avoid moving your furniture around. If any of your floors are uneven, fix them. If there are any pets around you, be aware of where they are. Review your medicines with your doctor. Some medicines can make you feel dizzy. This can increase your chance of falling. Ask your doctor what other things that you can do to help prevent falls. This information is not intended to replace advice given to you by your health care provider. Make sure you discuss any questions you have with your health care provider. Document Released: 06/11/2009 Document Revised: 01/21/2016 Document Reviewed: 09/19/2014 Elsevier Interactive Patient Education  2017 [employer].

## 2022-10-04 NOTE — Progress Notes (Deleted)
Subjective:   Cameron Daniel is a 77 y.o. male who presents for Medicare Annual/Subsequent preventive examination.  Review of Systems    ***       Objective:    There were no vitals filed for this visit. There is no height or weight on file to calculate BMI.     09/02/2021   10:47 AM 02/22/2021    2:18 PM 06/19/2020    8:53 AM 04/15/2019    9:00 AM 04/12/2019    1:47 PM 04/11/2019   12:43 PM 02/15/2019   10:09 AM  Advanced Directives  Does Patient Have a Medical Advance Directive? Yes Yes No Yes Yes Yes No  Type of Paramedic of [place];Living will Healthcare Power of [place] of [place]   Does patient want to make changes to medical advance directive?  Yes (MAU/Ambulatory/Procedural Areas - Information given)  No - Patient declined  No - Patient declined   Copy of [place] in Chart? No - copy requested No - copy requested       Would patient like information on creating a medical advance directive?   No - Patient declined    No - Patient declined    Current Medications (verified) Outpatient Encounter Medications as of 10/04/2022  Medication Sig   acetaminophen (TYLENOL) 500 MG tablet Take 1,000 mg by mouth at bedtime as needed for mild pain.   aspirin EC 81 MG tablet Take 1 tablet (81 mg total) by mouth daily.   azelastine (ASTELIN) 0.1 % nasal spray Place 2 sprays into both nostrils 2 (two) times daily. Use in each nostril as directed   carvedilol (COREG) 12.5 MG tablet Take one tablet in the AM and Take two tablets in the PM   Cetirizine HCl (ZERVIATE) 0.24 % SOLN Apply 1 drop to eye 2 (two) times daily.   citalopram (CELEXA) 10 MG tablet TAKE 1 TABLET BY MOUTH EVERY DAY   colestipol (COLESTID) 5 g packet Take 5 g by mouth daily.    COMIRNATY SUSP injection    diphenoxylate-atropine (LOMOTIL) 2.5-0.025 MG tablet Take 2 tablets by mouth 4 (four) times  daily as needed for diarrhea or loose stools.   empagliflozin (JARDIANCE) 25 MG TABS tablet TAKE 25 MG (1 TABLET) BY MOUTH DAILY BEFORE BREAKFAST. STOP PIOGLITAZONE   famotidine (PEPCID) 20 MG tablet TAKE 1 TABLET BY MOUTH EVERYDAY AT BEDTIME   fexofenadine (ALLEGRA) 180 MG tablet Take 1 tablet (180 mg total) by mouth daily as needed. For allergies   finasteride (PROSCAR) 5 MG tablet TAKE 1 TABLET BY MOUTH EVERY DAY   hydrocortisone (ANUSOL-HC) 2.5 % rectal cream Place 1 Application rectally at bedtime.   loteprednol (LOTEMAX) 0.5 % ophthalmic suspension Place 1 drop into both eyes 4 (four) times daily as needed (itching).   magnesium oxide (MAG-OX) 400 MG tablet Take 1 tablet (400 mg total) by mouth daily.   metoCLOPramide (REGLAN) 10 MG tablet Take 1 tablet (10 mg total) by mouth 3 (three) times daily before meals.   metroNIDAZOLE (FLAGYL) 250 MG tablet Take 1 tablet (250 mg total) by mouth 3 (three) times daily.   montelukast (SINGULAIR) 10 MG tablet TAKE 1 TABLET BY MOUTH EVERYDAY AT BEDTIME   nystatin cream (MYCOSTATIN) Apply 1 Application topically 3 (three) times daily.   pantoprazole (PROTONIX) 40 MG tablet Take 1 tablet (40 mg total) by mouth 2 (two) times daily.   rosuvastatin (  CRESTOR) 40 MG tablet TAKE 1 TABLET BY MOUTH DAILY AT 6 PM.   sitaGLIPtin (JANUVIA) 50 MG tablet Take 1 tablet (50 mg total) by mouth daily.   vitamin B-12 (CYANOCOBALAMIN) 100 MCG tablet Take 100 mcg by mouth daily.   No facility-administered encounter medications on file as of 10/04/2022.    Allergies (verified) Doxazosin mesylate, Losartan, Testosterone, Flomax [tamsulosin hcl], Penicillins, Sulfonamide derivatives, and Trulicity [dulaglutide]   History: Past Medical History:  Diagnosis Date   Allergic rhinitis, cause unspecified    Allergy    Anxiety    Arthritis    Cancer ([place])    a couple places removed from eyelid   Carotid Doppler 04/2020   Carotid US 9/21: No evidence of ICA stenosis  bilaterally; right vertebral artery stenosis   Cataract    Chronic combined systolic (congestive) and diastolic (congestive) heart failure (HCC)    Coronary atherosclerosis of unspecified type of vessel, native or graft    a. MI 1985 with unclear details. b. CABG 03/2019 with LIMA -> LAD, SVG -> OM, SVG to dRCA.   Depression    PTSD    Diverticulosis of colon (without mention of hemorrhage)    Esophageal reflux    Esophageal stricture    Essential hypertension    Former tobacco use    Hemorrhoids    Hyperlipidemia    ICD (implantable cardioverter-defibrillator) in place    Irritable bowel syndrome    Ischemic cardiomyopathy    Mild carotid artery disease ([place])    a. 1-39% bilaterally 03/2019 duplex.   Myocardial infarction (HCC)    Nocturia    Obesity, unspecified    Orthostasis    Other acquired absence of organ    Personal history of colonic polyps    Psychosexual dysfunction with inhibited sexual excitement    Retroperitoneal bleed    Type II or unspecified type diabetes mellitus without mention of complication, not stated as uncontrolled    VT (ventricular tachycardia) ([place])    Past Surgical History:  Procedure Laterality Date   ADENOIDECTOMY     CARPAL TUNNEL RELEASE     left    CHOLECYSTECTOMY     COLON RESECTION     18 inches   COLON SURGERY  2013   CORONARY ANGIOPLASTY     1985    CORONARY ARTERY BYPASS GRAFT N/A 04/26/2019   Procedure: CORONARY ARTERY BYPASS GRAFTING (CABG) x Three, using left internal mammary artery and right leg greater saphenous vein harvested endoscopically;  Surgeon: Gaye Pollack, MD;  Location: [facility];  Service: Open Heart Surgery;  Laterality: N/A;   ELBOW BURSA SURGERY     EYE SURGERY     ICD IMPLANT N/A 06/25/2020   Procedure: ICD IMPLANT;  Surgeon: Vickie Epley, MD;  Location: [facility];  Service: Cardiovascular;  Laterality: N/A;   LEFT HEART CATH AND CORS/GRAFTS ANGIOGRAPHY N/A 06/19/2020   Procedure: LEFT HEART CATH  AND CORS/GRAFTS ANGIOGRAPHY;  Surgeon: Troy Sine, MD;  Location: [facility];  Service: Cardiovascular;  Laterality: N/A;   POLYPECTOMY     RIGHT/LEFT HEART CATH AND CORONARY ANGIOGRAPHY N/A 04/16/2019   Procedure: RIGHT/LEFT HEART CATH AND CORONARY ANGIOGRAPHY;  Surgeon: Belva Crome, MD;  Location: [facility];  Service: Cardiovascular;  Laterality: N/A;   SHOULDER SURGERY     Rt. Shoulder   TEE WITHOUT CARDIOVERSION N/A 04/26/2019   Procedure: TRANSESOPHAGEAL ECHOCARDIOGRAM (TEE);  Surgeon: Gaye Pollack, MD;  Location: [facility];  Service: Open Heart Surgery;  Laterality: N/A;   TONSILLECTOMY     V TACH ABLATION N/A 06/22/2020   Procedure: V TACH ABLATION;  Surgeon: Vickie Epley, MD;  Location: [facility];  Service: Cardiovascular;  Laterality: N/A;   Family History  Problem Relation Age of Onset   Lung cancer Father    Allergic rhinitis Father    Coronary artery disease Mother    Colon cancer Neg Hx    Heart attack Neg Hx    Hypertension Neg Hx    Stroke Neg Hx    Esophageal cancer Neg Hx    Rectal cancer Neg Hx    Stomach cancer Neg Hx    Pancreatic cancer Neg Hx    Social History   Socioeconomic History   Marital status: Married    Spouse name: Not on file   Number of children: 1   Years of education: 12   Highest education level: High school graduate  Occupational History   Occupation: Special educational needs teacher- retired  Tobacco Use   Smoking status: Former    Types: Cigarettes    Quit date: 08/30/1983    Years since quitting: 39.1   Smokeless tobacco: Former   Tobacco comments:    Quit 30 years ago.  Vaping Use   Vaping Use: Never used  Substance and Sexual Activity   Alcohol use: Yes    Comment: rare   Drug use: No   Sexual activity: Yes  Other Topics Concern   Not on file  Social History Narrative   Not on file   Social Determinants of Health   Financial Resource Strain: Low Risk  (09/02/2021)   Overall Financial Resource Strain (CARDIA)     Difficulty of Paying Living Expenses: Not hard at all  Food Insecurity: No Food Insecurity (09/02/2021)   Hunger Vital Sign    Worried About Running Out of Food in the Last Year: Never true    Ran Out of Food in the Last Year: Never true  Transportation Needs: No Transportation Needs (09/02/2021)   PRAPARE - Hydrologist (Medical): No    Lack of Transportation (Non-Medical): No  Physical Activity: Inactive (09/02/2021)   Exercise Vital Sign    Days of Exercise per Week: 0 days    Minutes of Exercise per Session: 0 min  Stress: No Stress Concern Present (09/02/2021)   [place]    Feeling of Stress : Not at all  Social Connections: Moderately Isolated (09/02/2021)   Social Connection and Isolation Panel [NHANES]    Frequency of Communication with Friends and Family: More than three times a week    Frequency of Social Gatherings with Friends and Family: More than three times a week    Attends Religious Services: Never    Marine scientist or Organizations: No    Attends Archivist Meetings: Never    Marital Status: Married    Tobacco Counseling Counseling given: Not Answered Tobacco comments: Quit 30 years ago.   Clinical Intake:                 Diabetic?Yes   Nutrition Risk Assessment:  Has the patient had any N/V/D within the last 2 months?  {YES/NO:21197} Does the patient have any non-healing wounds?  {YES/NO:21197} Has the patient had any unintentional weight loss or weight gain?  {YES/NO:21197}  Diabetes:  Is the patient diabetic?  {YES/NO:21197} If diabetic, was a CBG  obtained today?  {YES/NO:21197} Did the patient bring in their glucometer from home?  {YES/NO:21197} How often do you monitor your CBG's? ***.   Financial Strains and Diabetes Management:  Are you having any financial strains with the device, your supplies or your medication?  {YES/NO:21197}.  Does the patient want to be seen by Chronic Care Management for management of their diabetes?  {YES/NO:21197} Would the patient like to be referred to a Nutritionist or for Diabetic Management?  {YES/NO:21197}  Diabetic Exams:  {Diabetic Eye Exam:2101801} {Diabetic Foot Exam:2101802}          Activities of Daily Living     No data to display          Patient Care Team: [name], MD as PCP - General (Family Medicine) Bensimhon, Shaune Pascal, MD as PCP - Advanced Heart Failure (Cardiology) Vickie Epley, MD as PCP - Electrophysiology (Cardiology) Freada Bergeron, MD as PCP - Cardiology (Cardiology) Edythe Clarity, [facility] as Pharmacist (Pharmacist)  Indicate any recent Medical Services you may have received from other than [facility] providers in the past year (date may be approximate).     Assessment:   This is a routine wellness examination for [place].  Hearing/Vision screen No results found.  Dietary issues and exercise activities discussed:     Goals Addressed   None    Depression Screen    08/02/2022   12:23 PM 07/25/2022   11:44 AM 05/10/2022   10:48 AM 02/08/2022   10:45 AM 09/02/2021   10:44 AM 02/22/2021    1:42 PM 08/27/2020    9:25 AM  PHQ 2/9 Scores  PHQ - 2 Score 0 0 0 0 0 1 6  PHQ- 9 Score 0 0     14    Fall Risk    08/02/2022   12:22 PM 07/25/2022   11:45 AM 02/08/2022   10:45 AM 09/02/2021   10:47 AM 02/22/2021    1:41 PM  [place] in the past year? 0 0 0 0 0  Number falls in past yr: 0   0   Injury with Fall? 0   0   Risk for fall due to : No Fall Risks   Impaired balance/gait;Impaired mobility   Follow up Falls prevention discussed  Falls evaluation completed Falls prevention discussed     FALL RISK PREVENTION PERTAINING TO THE HOME:  Any stairs in or around the home? {YES/NO:21197} If so, are there any without handrails? {YES/NO:21197} Home free of loose throw rugs in walkways, pet beds,  electrical cords, etc? {YES/NO:21197} Adequate lighting in your home to reduce risk of falls? {YES/NO:21197}  ASSISTIVE DEVICES UTILIZED TO PREVENT FALLS:  Life alert? {YES/NO:21197} Use of a cane, walker or w/c? {YES/NO:21197} Grab bars in the bathroom? {YES/NO:21197} Shower chair or bench in shower? {YES/NO:21197} Elevated toilet seat or a handicapped toilet? {YES/NO:21197}  TIMED UP AND GO:  Was the test performed? {YES/NO:21197}.  Length of time to ambulate 10 feet: *** sec.   {Appearance of YN:[id number]  Cognitive Function:        09/02/2021   10:49 AM  6CIT Screen  What Year? 0 points  What month? 0 points  What time? 0 points  Count back from 20 0 points  Months in reverse 0 points  Repeat phrase 0 points  Total Score 0 points    Immunizations Immunization History  Administered Date(s) Administered   Fluad Quad(high Dose 65+) 06/07/2019, 07/02/2020, 05/31/2021, 06/08/2022   H1N1  07/29/2008   Influenza, High Dose Seasonal PF 06/09/2015, 06/08/2022   Influenza, Seasonal, Injecte, Preservative Fre 07/03/2009, 06/02/2010, 06/27/2011, 06/03/2014   Influenza,inj,Quad PF,6+ Mos 05/28/2013, 06/14/2016, 05/29/2017, 05/07/2018   Influenza-Unspecified 06/29/2008, 05/10/2012, 05/29/2012, 06/27/2013, 05/23/2014, 06/07/2016, 06/29/2017, 05/29/2018, 05/30/2019, 06/17/2020   MMR 10/30/2014   PFIZER(Purple Top)SARS-COV-2 Vaccination 10/03/2019, 10/24/2019, 06/01/2020   Pneumococcal Conjugate-13 11/10/2014, 12/08/2016, 03/05/2020   Pneumococcal Polysaccharide-23 10/29/2008, 05/28/2013, 03/07/2016   Tdap 10/24/2011   Zoster Recombinat (Shingrix) 08/15/2017, 01/26/2018   Zoster, Live 03/21/2011, 11/28/2018    TDAP status: Due, Education has been provided regarding the importance of this vaccine. Advised may receive this vaccine at local pharmacy or Health Dept. Aware to provide a copy of the vaccination record if obtained from local pharmacy or Health Dept. Verbalized acceptance  and understanding.  Flu Vaccine status: Up to date  Pneumococcal vaccine status: Up to date  Covid-19 vaccine status: Information provided on how to obtain vaccines.   Qualifies for Shingles Vaccine? Yes   Zostavax completed Yes   Shingrix Completed?: Yes  Screening Tests Health Maintenance  Topic Date Due   Diabetic kidney evaluation - Urine ACR  Never done   FOOT EXAM  05/05/2021   COVID-19 Vaccine (7 - 2023-24 season) 08/09/2022   Medicare Annual Wellness (AWV)  09/02/2022   OPHTHALMOLOGY EXAM  09/10/2022   Hepatitis C Screening  08/03/2023 (Originally 01/05/1964)   HEMOGLOBIN A1C  11/08/2022   Diabetic kidney evaluation - eGFR measurement  05/11/2023   Fecal DNA (Cologuard)  06/08/2024   Pneumonia Vaccine 20+ Years old  Completed   INFLUENZA VACCINE  Completed   Zoster Vaccines- Shingrix  Completed   HPV VACCINES  Aged Out   DTaP/Tdap/Td  Discontinued   COLONOSCOPY (Pts 45-35yr Insurance coverage will need to be confirmed)  Discontinued    Health Maintenance  Health Maintenance Due  Topic Date Due   Diabetic kidney evaluation - Urine ACR  Never done   FOOT EXAM  05/05/2021   COVID-19 Vaccine (7 - 2023-24 season) 08/09/2022   Medicare Annual Wellness (AWV)  09/02/2022   OPHTHALMOLOGY EXAM  09/10/2022    Colorectal cancer screening: No longer required.   Lung Cancer Screening: (Low Dose CT Chest recommended if Age 76-80years, 30 pack-year currently smoking OR have quit w/in 15years.) does not qualify.   Lung Cancer Screening Referral: n/a   Additional Screening:  Hepatitis C Screening: does qualify; Completed at next office visit   Vision Screening: Recommended annual ophthalmology exams for early detection of glaucoma and other disorders of the eye. Is the patient up to date with their annual eye exam?  {YES/NO:21197} Who is the provider or what is the name of the office in which the patient attends annual eye exams? *** If pt is not established with a  provider, would they like to be referred to a provider to establish care? {YES/NO:21197}.   Dental Screening: Recommended annual dental exams for proper oral hygiene  Community Resource Referral / Chronic Care Management: CRR required this visit?  {YES/NO:21197}  CCM required this visit?  {YES/NO:21197}     Plan:     I have personally reviewed and noted the following in the patient's chart:   Medical and social history Use of alcohol, tobacco or illicit drugs  Current medications and supplements including opioid prescriptions. {Opioid Prescriptions:[phone]} Functional ability and status Nutritional status Physical activity Advanced directives List of other physicians Hospitalizations, surgeries, and ER visits in previous 12 months Vitals Screenings to include cognitive, depression, and falls Referrals  and appointments  In addition, I have reviewed and discussed with patient certain preventive protocols, quality metrics, and best practice recommendations. A written personalized care plan for preventive services as well as general preventive health recommendations were provided to patient.     Denman George [place], [place]   [id number]   Nurse Notes: ***

## 2022-10-06 ENCOUNTER — Encounter: Payer: Self-pay | Admitting: Internal Medicine

## 2022-10-06 ENCOUNTER — Ambulatory Visit (AMBULATORY_SURGERY_CENTER): Payer: Medicare HMO | Admitting: Internal Medicine

## 2022-10-06 VITALS — BP 117/70 | HR 65 | Temp 97.3°F | Resp 15 | Ht 71.0 in | Wt 205.0 lb

## 2022-10-06 DIAGNOSIS — K222 Esophageal obstruction: Secondary | ICD-10-CM

## 2022-10-06 DIAGNOSIS — R634 Abnormal weight loss: Secondary | ICD-10-CM | POA: Diagnosis not present

## 2022-10-06 DIAGNOSIS — I2510 Atherosclerotic heart disease of native coronary artery without angina pectoris: Secondary | ICD-10-CM | POA: Diagnosis not present

## 2022-10-06 DIAGNOSIS — E119 Type 2 diabetes mellitus without complications: Secondary | ICD-10-CM | POA: Diagnosis not present

## 2022-10-06 DIAGNOSIS — I10 Essential (primary) hypertension: Secondary | ICD-10-CM | POA: Diagnosis not present

## 2022-10-06 DIAGNOSIS — K317 Polyp of stomach and duodenum: Secondary | ICD-10-CM

## 2022-10-06 DIAGNOSIS — K3189 Other diseases of stomach and duodenum: Secondary | ICD-10-CM | POA: Diagnosis not present

## 2022-10-06 DIAGNOSIS — R1310 Dysphagia, unspecified: Secondary | ICD-10-CM

## 2022-10-06 DIAGNOSIS — R140 Abdominal distension (gaseous): Secondary | ICD-10-CM | POA: Diagnosis not present

## 2022-10-06 MED ORDER — SODIUM CHLORIDE 0.9 % IV SOLN: 500.0000 mL | Freq: Once | INTRAVENOUS | Status: DC

## 2022-10-06 NOTE — Op Note (Signed)
[facility] [name] Procedure Date: 10/06/2022 2:59 PM MRN: [id number] Endoscopist: Docia Chuck. Henrene Pastor , MD, [id number] Age: 77 Referring MD:  Date of Birth: 1946/01/18 Gender: Male Account #: [id number] Procedure:                Upper GI endoscopy with biopsies; Maloney dilation                            of the esophagus. 89 [place] Indications:              Dyspepsia, Dysphagia, Weight loss Medicines:                Monitored Anesthesia Care Procedure:                Pre-Anesthesia Assessment:                           - Prior to the procedure, a History and Physical                            was performed, and patient medications and                            allergies were reviewed. The patient's tolerance of                            previous anesthesia was also reviewed. The risks                            and benefits of the procedure and the sedation                            options and risks were discussed with the patient.                            All questions were answered, and informed consent                            was obtained. Prior Anticoagulants: The patient has                            taken no anticoagulant or antiplatelet agents. ASA                            Grade Assessment: III - A patient with severe                            systemic disease. After reviewing the risks and                            benefits, the patient was deemed in satisfactory                            condition to undergo the procedure.  After obtaining informed consent, the endoscope was                            passed under direct vision. Throughout the                            procedure, the patient's blood pressure, pulse, and                            oxygen saturations were monitored continuously. The                            Olympus Scope [id number] was introduced through the                            mouth,  and advanced to the second part of duodenum.                            The upper GI endoscopy was accomplished without                            difficulty. The patient tolerated the procedure                            well. Scope In: Scope Out: Findings:                 The esophagus revealed a benign ringlike stricture                            at the gastroesophageal junction. No active                            inflammation.. The scope was withdrawn. Dilation                            was performed with a Maloney dilator with no                            resistance at 70 Fr.                           The stomach revealed multiple benign fundic gland                            type polyps. Biopsies were taken, from several                            representatives, with a cold forceps for histology.                            There was also a 5 mm in diameter 1 cm in length  friable hyperplastic appearing polyp in the gastric                            body. See image. This was purposely not biopsied                            due to the propensity for bleeding.                           The examined duodenum revealed changes consistent                            with prior gland hyperplasia. Also a polypoid                            lesion just beyond the pylorus also consistent with                            the same.. Biopsies were taken with a cold forceps                            for histology. Postbulbar duodenum was normal. The                            major ampulla visualized.                           The cardia and gastric fundus were normal on                            retroflexion, save small hiatal hernia. Complications:            No immediate complications. Estimated Blood Loss:     Estimated blood loss: none. Impression:               1. Esophageal stricture. Dilated                           2. Multiple benign fundic gland type  polyps.                            Biopsied                           3. Inflammatory appearing gastric polyp. Not                            biopsied                           4. 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                           5. Dyspeptic symptoms and weight loss. Recommendation:           - Patient has a contact number available for  emergencies. The signs and symptoms of potential                            delayed complications were discussed with the                            patient. Return to normal activities tomorrow.                            Written discharge instructions were provided to the                            patient.                           - Post dilation diet.                           - Continue present medications.                           - Await pathology results.                           - Schedule contrast-enhanced CT scan of the abdomen                            pelvis "abdominal discomfort and weight loss,                            evaluate".                           -Office follow-up with Dr. Henrene Pastor in 4 to 6 weeks Docia Chuck. Henrene Pastor, MD 10/06/2022 3:35:31 PM This report has been signed electronically.

## 2022-10-06 NOTE — Progress Notes (Signed)
Pt resting comfortably. VSS. Airway intact. SBAR complete to RN. All questions answered. Dentition as pre-pocedure

## 2022-10-06 NOTE — Progress Notes (Signed)
Called to room to assist during endoscopic procedure.  Patient ID and intended procedure confirmed with present staff. Received instructions for my participation in the procedure from the performing physician.  

## 2022-10-06 NOTE — Progress Notes (Signed)
HISTORY OF PRESENT ILLNESS:   Cameron Daniel is a 77 y.o. male with multiple medical problems as listed below.  He has a history of complicated diverticular disease with segmental colonic resection, history of multiple adenomatous colon polyps, and GERD.  He has had healthcare here as well as the [facility] system.  I last saw the patient January 2023 regarding increased intestinal gas, alternating bowel habits, and the need for surveillance colonoscopy.  See dictation.  He subsequently underwent complete colonoscopy March 2023.  He was found to have polyps and diverticulosis.  Follow-up in 3 years recommended.   He presents today regarding a 106-monthhistory of increased issues with belching, early satiety, and associated weight loss of 20 pounds.  He was seen by the GI physician assistant June 27, 2022 and his PCP August 11, 2022.  See those dictations.  He tells me that he continues to have increased problems with burping.  No abdominal pain.  Less so at night when lying flat.  Continues to describe early satiety with associated decreased appetite.  His weight has been stable over the past week.  He does describe some mild solid food dysphagia.  He reports that his bowel habits tend alternate somewhat.  He will describe having had "a blowout" followed by diarrhea for few days, then constipation.  He does use Colace as needed.  He did try probiotic align for 2 months.  This did not help.  He tells me that his diabetes is under better control on Jardiance he takes pantoprazole for GERD.  No active GERD symptoms.   Review of blood work from May 10, 2022 shows normal CBC with hemoglobin 15.2.  Abdominal films from August 02, 2022 are unremarkable.  CT scan March 2021 is without acute findings   REVIEW OF SYSTEMS:   All non-GI ROS negative otherwise stated in the HPI except for sinus allergy, anxiety, arthritis, fatigue, hearing problems       Past Medical History:  Diagnosis Date    Allergic rhinitis, cause unspecified     Allergy     Anxiety     Arthritis     [place]      a couple places removed from eyelid   Carotid Doppler 04/2020    Carotid [place]: No evidence of ICA stenosis bilaterally; right vertebral artery stenosis   Cataract     Chronic combined systolic (congestive) and diastolic (congestive) heart failure (HCC)     Coronary atherosclerosis of unspecified type of vessel, native or graft      a. MI 1985 with unclear details. b. CABG 03/2019 with LIMA -> LAD, SVG -> OM, SVG to dRCA.   Depression      PTSD    Diverticulosis of colon (without mention of hemorrhage)     Esophageal reflux     Esophageal stricture     Essential hypertension     Former tobacco use     Hemorrhoids     Hyperlipidemia     ICD (implantable cardioverter-defibrillator) in place     Irritable bowel syndrome     Ischemic cardiomyopathy     Mild carotid artery disease ([place]      a. 1-39% bilaterally 03/2019 duplex.   Myocardial infarction (HCC)     Nocturia     Obesity, unspecified     Orthostasis     Other acquired absence of organ     Personal history of colonic polyps     Psychosexual dysfunction with inhibited sexual  excitement     Retroperitoneal bleed     Type II or unspecified type diabetes mellitus without mention of complication, not stated as uncontrolled     VT (ventricular tachycardia) ([place])             Past Surgical History:  Procedure Laterality Date   ADENOIDECTOMY       CARPAL TUNNEL RELEASE        left    CHOLECYSTECTOMY       COLON RESECTION        18 inches   COLON SURGERY   2013   CORONARY ANGIOPLASTY        1985    CORONARY ARTERY BYPASS GRAFT N/A 04/26/2019    Procedure: CORONARY ARTERY BYPASS GRAFTING (CABG) x Three, using left internal mammary artery and right leg greater saphenous vein harvested endoscopically;  Surgeon: Gaye Pollack, MD;  Location: [place];  Service: Open Heart Surgery;  Laterality: N/A;   ELBOW BURSA SURGERY       EYE  SURGERY       ICD IMPLANT N/A 06/25/2020    Procedure: ICD IMPLANT;  Surgeon: Vickie Epley, MD;  Location: [place] [facility];  Service: Cardiovascular;  Laterality: N/A;   LEFT HEART CATH AND CORS/GRAFTS ANGIOGRAPHY N/A 06/19/2020    Procedure: LEFT HEART CATH AND CORS/GRAFTS ANGIOGRAPHY;  Surgeon: Troy Sine, MD;  Location: [place] [facility];  Service: Cardiovascular;  Laterality: N/A;   POLYPECTOMY       RIGHT/LEFT HEART CATH AND CORONARY ANGIOGRAPHY N/A 04/16/2019    Procedure: RIGHT/LEFT HEART CATH AND CORONARY ANGIOGRAPHY;  Surgeon: Belva Crome, MD;  Location: [place] [facility];  Service: Cardiovascular;  Laterality: N/A;   SHOULDER SURGERY        Rt. Shoulder   TEE WITHOUT CARDIOVERSION N/A 04/26/2019    Procedure: TRANSESOPHAGEAL ECHOCARDIOGRAM (TEE);  Surgeon: Gaye Pollack, MD;  Location: [place];  Service: Open Heart Surgery;  Laterality: N/A;   TONSILLECTOMY       V TACH ABLATION N/A 06/22/2020    Procedure: V TACH ABLATION;  Surgeon: Vickie Epley, MD;  Location: [place] [facility];  Service: Cardiovascular;  Laterality: N/A;      Social History BEKIM WERNTZ  reports that he quit smoking about 39 years ago. His smoking use included cigarettes. He has quit using smokeless tobacco. He reports current alcohol use. He reports that he does not use drugs.   family history includes Allergic rhinitis in his father; Coronary artery disease in his mother; Lung cancer in his father.        Allergies  Allergen Reactions   Doxazosin Mesylate Other (See Comments)      Drops in blood pressure    Losartan        Other reaction(s): Muscle pain   Testosterone        Other reaction(s): Erythrocytosis Other reaction(s): Erythrocytosis   Flomax [Tamsulosin Hcl] Itching and Other (See Comments)      Drops in blood pressure      Penicillins Rash and Other (See Comments)      Has patient had a PCN reaction causing immediate rash, facial/tongue/throat swelling, SOB or  lightheadedness with hypotension: No Has patient had a PCN reaction causing severe rash involving mucus membranes or skin necrosis: No Has patient had a PCN reaction that required hospitalization No Has patient had a PCN reaction occurring within the last 10 years: No If all of the above answers  are "NO", then may proceed with Cephalosporin use.     Sulfonamide Derivatives Rash   Trulicity [Dulaglutide] Nausea And Vomiting      N&V, Dizziness          PHYSICAL EXAMINATION: Vital signs: Pulse 60   Ht '5\' 11"'$  (1.803 m)   Wt 205 lb (93 kg)   BMI 28.59 kg/m   Constitutional: generally well-appearing, no acute distress Psychiatric: alert and oriented x3, cooperative Eyes: extraocular movements intact, anicteric, conjunctiva pink Mouth: oral pharynx moist, no lesions Neck: supple no lymphadenopathy Cardiovascular: heart regular rate and rhythm, no murmur Lungs: clear to auscultation bilaterally Abdomen: soft, nontender, nondistended, no obvious ascites, no peritoneal signs, normal bowel sounds, no organomegaly.  Previous surgical incisions well-healed.  Abdominal diastases Rectal: Omitted Extremities: no clubbing, cyanosis.  Trace lower extremity edema bilaterally Skin: no lesions on visible extremities Neuro: No focal deficits.  Cranial nerves intact   ASSESSMENT:   1.  Intestinal bloating with early satiety and weight loss.  Rule out gastroparesis, rule obstructing lesion, rule out bacterial overgrowth. 2.  Intermittent dysphagia.  Rule out stricture.  Rule out neoplasia 3.  GERD.  On PPI 4.  History of complicated diverticular disease requiring surgery. 5.  History of multiple adenomatous polyps.  Surveillance up-to-date 6.  Multiple significant medical problems including diabetes and multiple cardiac issues.  Clinically stable 7.  Alternating bowel habits   PLAN:   1.  Prescribe metronidazole 250 mg p.o. 3 times daily for 10 days for possible bacterial overgrowth 2.   Citrucel 2 tablespoons daily for alternating bowel habits 3.  Schedule upper endoscopy possible esophageal dilation.  Patient is higher than baseline risk due to his age and comorbidities.The nature of the procedure, as well as the risks, benefits, and alternatives were carefully and thoroughly reviewed with the patient. Ample time for discussion and questions allowed. The patient understood, was satisfied, and agreed to proceed. 4.  Adjust diabetic medications preprocedure 5.  Depending upon the outcome of the above, consider CT scan of the abdomen pelvis to evaluate abdominal complaints and weight loss. 6.  Prescribed Anusol HC suppository (generic equivalent).

## 2022-10-06 NOTE — Patient Instructions (Signed)
Await pathology results.  Handouts on esophageal stricture/dilation and post dilation diet provided.  See procedure report for MD's recommendations/follow up appointments.  YOU HAD AN ENDOSCOPIC PROCEDURE TODAY AT [facility]:   Refer to the procedure report that was given to you for any specific questions about what was found during the examination.  If the procedure report does not answer your questions, please call your gastroenterologist to clarify.  If you requested that your care partner not be given the details of your procedure findings, then the procedure report has been included in a sealed envelope for you to review at your convenience later.  YOU SHOULD EXPECT: Some feelings of bloating in the abdomen. Passage of more gas than usual.  Walking can help get rid of the air that was put into your GI tract during the procedure and reduce the bloating. If you had a lower endoscopy (such as a colonoscopy or flexible sigmoidoscopy) you may notice spotting of blood in your stool or on the toilet paper. If you underwent a bowel prep for your procedure, you may not have a normal bowel movement for a few days.  Please Note:  You might notice some irritation and congestion in your nose or some drainage.  This is from the oxygen used during your procedure.  There is no need for concern and it should clear up in a day or so.  SYMPTOMS TO REPORT IMMEDIATELY:   Following upper endoscopy (EGD)  Vomiting of blood or coffee ground material  New chest pain or pain under the shoulder blades  Painful or persistently difficult swallowing  New shortness of breath  Fever of 100F or higher  Black, tarry-looking stools  For urgent or emergent issues, a gastroenterologist can be reached at any hour by calling [phone]. Do not use MyChart messaging for urgent concerns.    DIET:  We do recommend a small meal at first, but then you may proceed to your regular diet.  Drink plenty of  fluids but you should avoid alcoholic beverages for 24 hours.  ACTIVITY:  You should plan to take it easy for the rest of today and you should NOT DRIVE or use heavy machinery until tomorrow (because of the sedation medicines used during the test).    FOLLOW UP: Our staff will call the number listed on your records the next business day following your procedure.  We will call around 7:15- 8:00 am to check on you and address any questions or concerns that you may have regarding the information given to you following your procedure. If we do not reach you, we will leave a message.     If any biopsies were taken you will be contacted by phone or by letter within the next 1-3 weeks.  Please call us at [phone] if you have not heard about the biopsies in 3 weeks.    SIGNATURES/CONFIDENTIALITY: You and/or your care partner have signed paperwork which will be entered into your electronic medical record.  These signatures attest to the fact that that the information above on your After Visit Summary has been reviewed and is understood.  Full responsibility of the confidentiality of this discharge information lies with you and/or your care-partner.

## 2022-10-06 NOTE — Progress Notes (Signed)
Pt's states no medical or surgical changes since previsit or office visit. 

## 2022-10-07 ENCOUNTER — Telehealth: Payer: Self-pay | Admitting: *Deleted

## 2022-10-07 ENCOUNTER — Telehealth: Payer: Self-pay

## 2022-10-07 ENCOUNTER — Other Ambulatory Visit: Payer: Self-pay

## 2022-10-07 DIAGNOSIS — R109 Unspecified abdominal pain: Secondary | ICD-10-CM

## 2022-10-07 DIAGNOSIS — R634 Abnormal weight loss: Secondary | ICD-10-CM

## 2022-10-07 NOTE — Telephone Encounter (Signed)
Pt scheduled for OV with Dr. Henrene Pastor 11/23/22 at 11am. CT of ap ordered and rad scheduling to notify pt regarding appt.

## 2022-10-07 NOTE — Telephone Encounter (Signed)
  Follow up Call-     10/06/2022    1:59 PM 11/08/2021    9:31 AM  Call back number  Post procedure Call Back phone  # [phone]  Permission to leave phone message Yes Yes     Patient questions:  Do you have a fever, pain , or abdominal swelling? No. Pain Score  0 *  Have you tolerated food without any problems? Yes.    Have you been able to return to your normal activities? Yes.    Do you have any questions about your discharge instructions: Diet   No. Medications  No. Follow up visit  No.  Do you have questions or concerns about your Care? No.  Actions: * If pain score is 4 or above: No action needed, pain <4.

## 2022-10-11 ENCOUNTER — Telehealth: Payer: Self-pay | Admitting: Family Medicine

## 2022-10-11 ENCOUNTER — Encounter: Payer: Self-pay | Admitting: Internal Medicine

## 2022-10-11 NOTE — Progress Notes (Signed)
Remote ICD transmission.   

## 2022-10-11 NOTE — Telephone Encounter (Signed)
Left message for patient to call back and schedule Medicare Annual Wellness Visit (AWV) in office.   If not able to come in office, please offer to do virtually or by telephone.   Last AWV:09/02/2021   Please schedule at any time with [facility]-Nurse Health Advisor.  30 minute appointment  Any questions, please contact me at [phone]   Thank you,   [facility]  Ambulatory Clinical Support for [place] [facility] ??[id number] or ??[id number]

## 2022-10-13 ENCOUNTER — Telehealth: Payer: Self-pay | Admitting: Internal Medicine

## 2022-10-13 NOTE — Telephone Encounter (Signed)
Pathology letter reviewed with pt.

## 2022-10-13 NOTE — Telephone Encounter (Signed)
Inbound call from pt, requesting results from 10/06/2022.Cameron Daniel Please advise

## 2022-10-20 ENCOUNTER — Ambulatory Visit (HOSPITAL_COMMUNITY)
Admission: RE | Admit: 2022-10-20 | Discharge: 2022-10-20 | Disposition: A | Discharge status: Home / Self Care | Payer: Medicare HMO | Source: Ambulatory Visit | Attending: Internal Medicine | Admitting: Internal Medicine

## 2022-10-20 DIAGNOSIS — R634 Abnormal weight loss: Secondary | ICD-10-CM | POA: Diagnosis not present

## 2022-10-20 DIAGNOSIS — I700 Atherosclerosis of aorta: Secondary | ICD-10-CM | POA: Diagnosis not present

## 2022-10-20 DIAGNOSIS — R109 Unspecified abdominal pain: Secondary | ICD-10-CM | POA: Insufficient documentation

## 2022-10-20 LAB — POCT I-STAT CREATININE: Creatinine, Ser: 0.9 mg/dL (ref 0.61–1.24)

## 2022-10-20 MED ORDER — IOHEXOL 300 MG/ML  SOLN
100.0000 mL | Freq: Once | INTRAMUSCULAR | Status: AC | PRN
  Administered 2022-10-20: 100 mL via INTRAVENOUS

## 2022-10-21 ENCOUNTER — Telehealth: Payer: Self-pay | Admitting: Internal Medicine

## 2022-10-21 NOTE — Telephone Encounter (Signed)
Patient is calling seeking results from his CT scan yesterday. Please advise

## 2022-10-21 NOTE — Telephone Encounter (Signed)
Left message for pt that his results have not been read yet, that he will be called once the results are final and they have been reviewed by Dr. Henrene Pastor.

## 2022-10-24 NOTE — Telephone Encounter (Signed)
Patient is requesting a call back to discuss appt made.

## 2022-10-24 NOTE — Telephone Encounter (Signed)
Spoke with pt and he is aware of CT results and appt.

## 2022-11-15 ENCOUNTER — Other Ambulatory Visit: Payer: Self-pay | Admitting: Family Medicine

## 2022-11-15 ENCOUNTER — Telehealth: Payer: Self-pay | Admitting: Family Medicine

## 2022-11-15 MED ORDER — COLESTIPOL HCL 5 G PO PACK: 5.0000 g | PACK | Freq: Every day | ORAL | 11 refills | Status: DC

## 2022-11-15 NOTE — Telephone Encounter (Signed)
Patient called to request for Dr. Dennard Schaumann to call in a refill of  colestipol (COLESTID) 5 g packet [id number]   Although the medication is prescribed by a different doctor, the patient stated the pharmacy they've sent the refill to is out of stock and it's on back order. Patient stated he has followed up with them three different times and they don't know when the medication will be back in stock.  Requesting for Dr. Dennard Schaumann to send the refill to    [id number] - [place], [address], [place] [address] Phone: [phone]  Fax: [phone] DEA #: [id number]   Please advise patient at [phone]. Ok to leave a message if he doesn't answer.

## 2022-11-23 ENCOUNTER — Ambulatory Visit: Payer: Medicare HMO | Admitting: Internal Medicine

## 2022-11-23 ENCOUNTER — Encounter: Payer: Self-pay | Admitting: Internal Medicine

## 2022-11-23 VITALS — BP 120/76 | HR 70 | Ht 71.0 in | Wt 200.6 lb

## 2022-11-23 DIAGNOSIS — R141 Gas pain: Secondary | ICD-10-CM | POA: Diagnosis not present

## 2022-11-23 DIAGNOSIS — R142 Eructation: Secondary | ICD-10-CM

## 2022-11-23 DIAGNOSIS — R194 Change in bowel habit: Secondary | ICD-10-CM | POA: Diagnosis not present

## 2022-11-23 DIAGNOSIS — R109 Unspecified abdominal pain: Secondary | ICD-10-CM | POA: Diagnosis not present

## 2022-11-23 DIAGNOSIS — K219 Gastro-esophageal reflux disease without esophagitis: Secondary | ICD-10-CM

## 2022-11-23 DIAGNOSIS — R143 Flatulence: Secondary | ICD-10-CM | POA: Diagnosis not present

## 2022-11-23 DIAGNOSIS — R634 Abnormal weight loss: Secondary | ICD-10-CM

## 2022-11-23 DIAGNOSIS — R140 Abdominal distension (gaseous): Secondary | ICD-10-CM | POA: Diagnosis not present

## 2022-11-23 MED ORDER — METRONIDAZOLE 250 MG PO TABS: 250.0000 mg | ORAL_TABLET | Freq: Three times a day (TID) | ORAL | 1 refills | Status: DC

## 2022-11-23 NOTE — Progress Notes (Signed)
HISTORY OF PRESENT ILLNESS:  Cameron Daniel is a 77 y.o. male with multiple medical problems as listed below.  He has a history of complicated diverticular disease with segmental colonic resection, history of multiple adenomatous colon polyps, and GERD.  He has had healthcare here as well as the [facility] system.  Seen in January 2023 regarding increased intestinal gas, alternating bowel habits, and the need for surveillance colonoscopy.  See dictation.  He subsequently underwent complete colonoscopy March 2023.  He was found to have polyps and diverticulosis.  Follow-up in 3 years recommended.   He was seen most recently September 15, 2022 when he presented regarding a 48-month history of increased issues with belching, early satiety, and associated weight loss of 20 pounds.  He was seen by the GI physician assistant June 27, 2022 and his PCP August 11, 2022.  See those dictations.  At that time he told me that he continues to have increased problems with burping.  No abdominal pain.  Less so at night when lying flat.  Continues to describe early satiety with associated decreased appetite.  His weight has been stable over the past week.  He does describe some mild solid food dysphagia.  He reported that his bowel habits tend alternate somewhat.  He will describe having had "a blowout" followed by diarrhea for few days, then constipation.  He does use Colace as needed.  He did try probiotic align for 2 months.  This did not help.  He tells me that his diabetes is under better control on Jardiance he takes pantoprazole for GERD.  No active GERD symptoms.  See that dictation for details.   Review of blood work from May 10, 2022 shows normal CBC with hemoglobin 15.2.  Abdominal films from August 02, 2022 are unremarkable.  CT scan March 2021 is without acute findings.  At the time of his last visit he was treated with a course of metronidazole, Citrucel, and scheduled for upper endoscopy.  He  tells me that a course of metronidazole did seem to help his issues with gas and loose stools.  Upper endoscopy revealed esophageal stricture which was symptomatic and dilated.  Also benign fundic gland polyps.  No cause for GI symptoms found.  He was continued on PPI.  Was set up for CT scan of the abdomen and pelvis with contrast.  Performed October 20, 2022.  No acute findings.  He presents for follow-up at this time.  He tells me that he has had some improvement in his appetite and gained a few pounds.  He still belches when he lies down at night.  He reiterates that metronidazole did seem to help his gas and loose stools.  He now describes alternating bowel habits.  He continues on pantoprazole and Pepcid for GERD.  No new complaints.  Multiple questions.  Asks about hemorrhoids.  Wonders about hemorrhoid banding.  REVIEW OF SYSTEMS:  All non-GI ROS negative as otherwise stated in the HPI   Past Medical History:  Diagnosis Date   Allergic rhinitis, cause unspecified    Allergy    Anxiety    Arthritis    Cancer ([place])    a couple places removed from eyelid   Carotid Doppler 04/2020   Carotid US 9/21: No evidence of ICA stenosis bilaterally; right vertebral artery stenosis   Cataract    Chronic combined systolic (congestive) and diastolic (congestive) heart failure (HCC)    Coronary atherosclerosis of unspecified type of vessel, native or graft  a. [place] with unclear details. b. CABG 03/2019 with LIMA -> LAD, SVG -> OM, SVG to dRCA.   Depression    PTSD    Diverticulosis of colon (without mention of hemorrhage)    Esophageal reflux    Esophageal stricture    Essential hypertension    Former tobacco use    Hemorrhoids    Hyperlipidemia    ICD (implantable cardioverter-defibrillator) in place    Irritable bowel syndrome    Ischemic cardiomyopathy    Mild carotid artery disease ([place])    a. 1-39% bilaterally 03/2019 duplex.   Myocardial infarction (HCC)    Nocturia    Obesity,  unspecified    Orthostasis    Other acquired absence of organ    Personal history of colonic polyps    Psychosexual dysfunction with inhibited sexual excitement    Retroperitoneal bleed    Type II or unspecified type diabetes mellitus without mention of complication, not stated as uncontrolled    VT (ventricular tachycardia) ([place])     Past Surgical History:  Procedure Laterality Date   ADENOIDECTOMY     CARPAL TUNNEL RELEASE     left    CHOLECYSTECTOMY     COLON RESECTION     18 inches   COLON SURGERY  2013   CORONARY ANGIOPLASTY     1985    CORONARY ARTERY BYPASS GRAFT N/A 04/26/2019   Procedure: CORONARY ARTERY BYPASS GRAFTING (CABG) x Three, using left internal mammary artery and right leg greater saphenous vein harvested endoscopically;  Surgeon: Gaye Pollack, MD;  Location: [place];  Service: Open Heart Surgery;  Laterality: N/A;   ELBOW BURSA SURGERY     EYE SURGERY     ICD IMPLANT N/A 06/25/2020   Procedure: ICD IMPLANT;  Surgeon: Vickie Epley, MD;  Location: [place] [facility];  Service: Cardiovascular;  Laterality: N/A;   LEFT HEART CATH AND CORS/GRAFTS ANGIOGRAPHY N/A 06/19/2020   Procedure: LEFT HEART CATH AND CORS/GRAFTS ANGIOGRAPHY;  Surgeon: Troy Sine, MD;  Location: [place] [facility];  Service: Cardiovascular;  Laterality: N/A;   POLYPECTOMY     RIGHT/LEFT HEART CATH AND CORONARY ANGIOGRAPHY N/A 04/16/2019   Procedure: RIGHT/LEFT HEART CATH AND CORONARY ANGIOGRAPHY;  Surgeon: Belva Crome, MD;  Location: [place] [facility];  Service: Cardiovascular;  Laterality: N/A;   SHOULDER SURGERY     Rt. Shoulder   TEE WITHOUT CARDIOVERSION N/A 04/26/2019   Procedure: TRANSESOPHAGEAL ECHOCARDIOGRAM (TEE);  Surgeon: Gaye Pollack, MD;  Location: [place];  Service: Open Heart Surgery;  Laterality: N/A;   TONSILLECTOMY     V TACH ABLATION N/A 06/22/2020   Procedure: V TACH ABLATION;  Surgeon: Vickie Epley, MD;  Location: [place] [facility];  Service:  Cardiovascular;  Laterality: N/A;    Social History Cameron Daniel  reports that he quit smoking about 39 years ago. His smoking use included cigarettes. He has quit using smokeless tobacco. He reports current alcohol use. He reports that he does not use drugs.  family history includes Allergic rhinitis in his father; Coronary artery disease in his mother; Lung cancer in his father.  Allergies  Allergen Reactions   Doxazosin Mesylate Other (See Comments)    Drops in blood pressure    Losartan     Other reaction(s): Muscle pain   Testosterone     Other reaction(s): Erythrocytosis Other reaction(s): Erythrocytosis   Flomax [Tamsulosin Hcl] Itching and Other (See Comments)    Drops  in blood pressure     Penicillins Rash and Other (See Comments)    Has patient had a PCN reaction causing immediate rash, facial/tongue/throat swelling, SOB or lightheadedness with hypotension: No Has patient had a PCN reaction causing severe rash involving mucus membranes or skin necrosis: No Has patient had a PCN reaction that required hospitalization No Has patient had a PCN reaction occurring within the last 10 years: No If all of the above answers are "NO", then may proceed with Cephalosporin use.    Sulfonamide Derivatives Rash   Trulicity [Dulaglutide] Nausea And Vomiting    N&V, Dizziness       PHYSICAL EXAMINATION: Vital signs: BP 120/76   Pulse 70   Ht 5\' 11"  (1.803 m)   Wt 200 lb 9.6 oz (91 kg)   BMI 27.98 kg/m  General: Well-developed, well-nourished, no acute distress HEENT: Sclerae are anicteric, conjunctiva pink. Oral mucosa intact Lungs: Clear Heart: Regular Abdomen: soft, nontender, nondistended, no obvious ascites, no peritoneal signs, normal bowel sounds. No organomegaly. Extremities: No edema Psychiatric: alert and oriented x3. Cooperative   ASSESSMENT:  1.  GERD complicated by peptic stricture.  Currently asymptomatic post dilation on PPI 2.  Chronic dyspepsia.   Some improvement with metronidazole.  Question bacterial overgrowth 3.  History of complicated diverticular disease status post segmental resection of the colon 4.  History of colon polyps.  Surveillance up-to-date 5.  Alternating bowel habits 6.  Hemorrhoids 7.  Multiple medical problems   PLAN:  1.  Reflux precautions 2.  Continue PPI and H2 receptor antagonist therapy 3.  Follow-up as needed for recurrent dysphagia 4.  Prescribe additional course of metronidazole 250 mg p.o. 3 times daily x 10 days.  1 refill for on-demand use if helpful. 5.  Citrucel 2 tablespoons daily. 6.  Continue with medicated suppositories for hemorrhoids as needed.  Discussed options for banding.  He wants to hold off. 7.  Surveillance colonoscopy around March 2026 8.  Interval follow-up as needed Total time of 30 minutes was spent.  Preparing to see the patient, obtaining interval history, performing medically appropriate physical examination, counseling and educating the patient regarding above listed issues, directing his medical therapies, answering multiple questions, and documenting clinical information in the health record

## 2022-11-23 NOTE — Patient Instructions (Signed)
_______________________________________________________  If your blood pressure at your visit was 140/90 or greater, please contact your primary care physician to follow up on this.  _______________________________________________________  If you are age 76 or older, your body mass index should be between 23-30. Your Body mass index is 27.98 kg/m. If this is out of the aforementioned range listed, please consider follow up with your Primary Care Provider.  If you are age 92 or younger, your body mass index should be between 19-25. Your Body mass index is 27.98 kg/m. If this is out of the aformentioned range listed, please consider follow up with your Primary Care Provider.   ________________________________________________________  The [facility] providers would like to encourage you to use [facility] to communicate with providers for non-urgent requests or questions.  Due to long hold times on the telephone, sending your provider a message by [facility] may be a faster and more efficient way to get a response.  Please allow 48 business hours for a response.  Please remember that this is for non-urgent requests.  _______________________________________________________  We have sent the following medications to your pharmacy for you to pick up at your convenience:  Metronidazole  Please follow up as needed

## 2022-12-01 ENCOUNTER — Telehealth: Payer: Self-pay | Admitting: Family Medicine

## 2022-12-01 MED ORDER — EMPAGLIFLOZIN 25 MG PO TABS: ORAL_TABLET | ORAL | 1 refills | Status: DC

## 2022-12-01 NOTE — Telephone Encounter (Signed)
Prescription Request  12/01/2022  LOV: 08/11/2022  What is the name of the medication or equipment?   empagliflozin (JARDIANCE) 25 MG TABS tablet  **PATIENT STATED OTHER PHARMACY HAS BEEN OUT OF STOCK. HE'LL PAY OUT OF POCKET IF NEEDED**  Have you contacted your pharmacy to request a refill? Yes   Which pharmacy would you like this sent to?  [id number] - [place], [place] - [address] [address] Phone: [phone] Fax: [phone]    Patient notified that their request is being sent to the clinical staff for review and that they should receive a response within 2 business days.   Please advise when refill sent in at [phone].

## 2022-12-02 DIAGNOSIS — M1711 Unilateral primary osteoarthritis, right knee: Secondary | ICD-10-CM | POA: Diagnosis not present

## 2022-12-05 ENCOUNTER — Encounter: Payer: Self-pay | Admitting: Family Medicine

## 2022-12-05 ENCOUNTER — Ambulatory Visit (INDEPENDENT_AMBULATORY_CARE_PROVIDER_SITE_OTHER): Payer: Medicare HMO | Admitting: Family Medicine

## 2022-12-05 VITALS — BP 130/70 | HR 72 | Temp 98.4°F | Ht 71.0 in | Wt 194.4 lb

## 2022-12-05 DIAGNOSIS — R5383 Other fatigue: Secondary | ICD-10-CM

## 2022-12-05 DIAGNOSIS — R6889 Other general symptoms and signs: Secondary | ICD-10-CM | POA: Diagnosis not present

## 2022-12-05 DIAGNOSIS — J3089 Other allergic rhinitis: Secondary | ICD-10-CM

## 2022-12-05 DIAGNOSIS — E291 Testicular hypofunction: Secondary | ICD-10-CM | POA: Diagnosis not present

## 2022-12-05 DIAGNOSIS — Z125 Encounter for screening for malignant neoplasm of prostate: Secondary | ICD-10-CM | POA: Diagnosis not present

## 2022-12-05 MED ORDER — PREDNISONE 20 MG PO TABS: ORAL_TABLET | ORAL | 0 refills | Status: DC

## 2022-12-05 MED ORDER — METHYLPREDNISOLONE ACETATE 80 MG/ML IJ SUSP
60.0000 mg | Freq: Once | INTRAMUSCULAR | Status: AC
  Administered 2022-12-05: 60 mg via INTRAMUSCULAR

## 2022-12-05 NOTE — Progress Notes (Signed)
Subjective:    Patient ID: Cameron Daniel, male    DOB: Sep 21, 1945, 77 y.o.   MRN: [id number]  Patient presents today with several complaints.  First he has a suspicious lesion on the dorsum of his right forearm.  It is an erythematous firm papule.  There is no fluctuance.  I tried lancing with a needle and no drainage came out.  It is shiny.  I am concerned it may be a basal cell carcinoma.  We treated this with liquid nitrogen cryotherapy for 30 seconds.  If persistent I would recommend a biopsy.  Second the patient reports early satiety and fatigue.  He has no energy.  He states that he has no appetite.  He is lost 10 pounds in the last month.  He has been seeing GI and has been treated for small bowel bacterial overgrowth.  This is help with bloating and gas however he continues to have no appetite.  He is taking Jardiance but he is not on a GLP-1.  With his weight loss, his sugars are started dropping so he quit the Januvia.  Jardiance primarily for his cardiovascular benefit.  He has a history of hypogonadism.  He questions of hypogonadism could be the cause of his fatigue.  He also reports muscle wasting.  He also reports severe allergies.  This is despite taking Flonase, Allegra, Xyzal, and Singulair.  He reports head congestion and rhinorrhea and itchy watery eyes and sinus pressure.  He is requesting a cortisone injection for temporary relief. Past Medical History:  Diagnosis Date   Allergic rhinitis, cause unspecified    Allergy    Anxiety    Arthritis    Cancer    a couple places removed from eyelid   Carotid Doppler 04/2020   Carotid US 9/21: No evidence of ICA stenosis bilaterally; right vertebral artery stenosis   Cataract    Chronic combined systolic (congestive) and diastolic (congestive) heart failure    Coronary atherosclerosis of unspecified type of vessel, native or graft    a. MI 1985 with unclear details. b. CABG 03/2019 with LIMA -> LAD, SVG -> OM, SVG to dRCA.    Depression    PTSD    Diverticulosis of colon (without mention of hemorrhage)    Esophageal reflux    Esophageal stricture    Essential hypertension    Former tobacco use    Hemorrhoids    Hyperlipidemia    ICD (implantable cardioverter-defibrillator) in place    Irritable bowel syndrome    Ischemic cardiomyopathy    Mild carotid artery disease    a. 1-39% bilaterally 03/2019 duplex.   Myocardial infarction    Nocturia    Obesity, unspecified    Orthostasis    Other acquired absence of organ    Personal history of colonic polyps    Psychosexual dysfunction with inhibited sexual excitement    Retroperitoneal bleed    Type II or unspecified type diabetes mellitus without mention of complication, not stated as uncontrolled    VT (ventricular tachycardia)    Past Surgical History:  Procedure Laterality Date   ADENOIDECTOMY     CARPAL TUNNEL RELEASE     left    CHOLECYSTECTOMY     COLON RESECTION     18 inches   COLON SURGERY  2013   CORONARY ANGIOPLASTY     1985    CORONARY ARTERY BYPASS GRAFT N/A 04/26/2019   Procedure: CORONARY ARTERY BYPASS GRAFTING (CABG) x Three, using left internal mammary  artery and right leg greater saphenous vein harvested endoscopically;  Surgeon: Alleen Borne, MD;  Location: [facility] OR;  Service: Open Heart Surgery;  Laterality: N/A;   ELBOW BURSA SURGERY     EYE SURGERY     ICD IMPLANT N/A 06/25/2020   Procedure: ICD IMPLANT;  Surgeon: Lanier Prude, MD;  Location: [facility] INVASIVE CV LAB;  Service: Cardiovascular;  Laterality: N/A;   LEFT HEART CATH AND CORS/GRAFTS ANGIOGRAPHY N/A 06/19/2020   Procedure: LEFT HEART CATH AND CORS/GRAFTS ANGIOGRAPHY;  Surgeon: Lennette Bihari, MD;  Location: [facility] INVASIVE CV LAB;  Service: Cardiovascular;  Laterality: N/A;   POLYPECTOMY     RIGHT/LEFT HEART CATH AND CORONARY ANGIOGRAPHY N/A 04/16/2019   Procedure: RIGHT/LEFT HEART CATH AND CORONARY ANGIOGRAPHY;  Surgeon: Lyn Records, MD;  Location: [facility] INVASIVE CV LAB;   Service: Cardiovascular;  Laterality: N/A;   SHOULDER SURGERY     Rt. Shoulder   TEE WITHOUT CARDIOVERSION N/A 04/26/2019   Procedure: TRANSESOPHAGEAL ECHOCARDIOGRAM (TEE);  Surgeon: Alleen Borne, MD;  Location: [facility] OR;  Service: Open Heart Surgery;  Laterality: N/A;   TONSILLECTOMY     V TACH ABLATION N/A 06/22/2020   Procedure: V TACH ABLATION;  Surgeon: Lanier Prude, MD;  Location: [facility] INVASIVE CV LAB;  Service: Cardiovascular;  Laterality: N/A;   Current Outpatient Medications on File Prior to Visit  Medication Sig Dispense Refill   acetaminophen (TYLENOL) 500 MG tablet Take 1,000 mg by mouth at bedtime as needed for mild pain.     aspirin EC 81 MG tablet Take 1 tablet (81 mg total) by mouth daily. 90 tablet 3   azelastine (ASTELIN) 0.1 % nasal spray Place 2 sprays into both nostrils 2 (two) times daily. Use in each nostril as directed 30 mL 12   carvedilol (COREG) 12.5 MG tablet Take one tablet in the AM and Take two tablets in the PM     Cetirizine HCl (ZERVIATE) 0.24 % SOLN Apply 1 drop to eye 2 (two) times daily. 30 each 3   citalopram (CELEXA) 10 MG tablet TAKE 1 TABLET BY MOUTH EVERY DAY 90 tablet 1   colestipol (COLESTID) 5 g packet Take 5 g by mouth daily. 30 each 11   diphenoxylate-atropine (LOMOTIL) 2.5-0.025 MG tablet Take 2 tablets by mouth 4 (four) times daily as needed for diarrhea or loose stools. 30 tablet 0   empagliflozin (JARDIANCE) 25 MG TABS tablet TAKE 25 MG (1 TABLET) BY MOUTH DAILY BEFORE BREAKFAST. STOP PIOGLITAZONE 90 tablet 1   famotidine (PEPCID) 20 MG tablet TAKE 1 TABLET BY MOUTH EVERYDAY AT BEDTIME 90 tablet 1   fexofenadine (ALLEGRA) 180 MG tablet Take 1 tablet (180 mg total) by mouth daily as needed. For allergies 90 tablet 0   finasteride (PROSCAR) 5 MG tablet TAKE 1 TABLET BY MOUTH EVERY DAY 90 tablet 3   hydrocortisone (ANUSOL-HC) 2.5 % rectal cream Place 1 Application rectally at bedtime. 30 g 1   magnesium oxide (MAG-OX) 400 MG tablet Take 1  tablet (400 mg total) by mouth daily. 90 tablet 3   metroNIDAZOLE (FLAGYL) 250 MG tablet Take 1 tablet (250 mg total) by mouth 3 (three) times daily. 30 tablet 1   montelukast (SINGULAIR) 10 MG tablet TAKE 1 TABLET BY MOUTH EVERYDAY AT BEDTIME 90 tablet 3   nystatin cream (MYCOSTATIN) Apply 1 Application topically 3 (three) times daily. 30 g 1   pantoprazole (PROTONIX) 40 MG tablet Take 1 tablet (40 mg total) by mouth 2 (  two) times daily. 60 tablet 11   rosuvastatin (CRESTOR) 40 MG tablet TAKE 1 TABLET BY MOUTH DAILY AT 6 PM. 90 tablet 3   sitaGLIPtin (JANUVIA) 50 MG tablet Take 1 tablet (50 mg total) by mouth daily. 90 tablet 1   vitamin B-12 (CYANOCOBALAMIN) 100 MCG tablet Take 100 mcg by mouth daily.     No current facility-administered medications on file prior to visit.   Allergies  Allergen Reactions   Doxazosin Mesylate Other (See Comments)    Drops in blood pressure    Losartan     Other reaction(s): Muscle pain   Testosterone     Other reaction(s): Erythrocytosis Other reaction(s): Erythrocytosis   Flomax [Tamsulosin Hcl] Itching and Other (See Comments)    Drops in blood pressure     Penicillins Rash and Other (See Comments)    Has patient had a PCN reaction causing immediate rash, facial/tongue/throat swelling, SOB or lightheadedness with hypotension: No Has patient had a PCN reaction causing severe rash involving mucus membranes or skin necrosis: No Has patient had a PCN reaction that required hospitalization No Has patient had a PCN reaction occurring within the last 10 years: No If all of the above answers are "NO", then may proceed with Cephalosporin use.    Sulfonamide Derivatives Rash   Trulicity [Dulaglutide] Nausea And Vomiting    N&V, Dizziness   Social History   Socioeconomic History   Marital status: Married    Spouse name: Not on file   Number of children: 1   Years of education: 12   Highest education level: High school graduate  Occupational History    Occupation: [employer]- retired  Tobacco Use   Smoking status: Former    Types: Cigarettes    Quit date: 08/30/1983    Years since quitting: 39.2   Smokeless tobacco: Former   Tobacco comments:    Quit 30 years ago.  Vaping Use   Vaping Use: Never used  Substance and Sexual Activity   Alcohol use: Yes    Comment: rare   Drug use: No   Sexual activity: Yes  Other Topics Concern   Not on file  Social History Narrative   Not on file   Social Determinants of Health   Financial Resource Strain: Low Risk  (09/02/2021)   Overall Financial Resource Strain (CARDIA)    Difficulty of Paying Living Expenses: Not hard at all  Food Insecurity: No Food Insecurity (09/02/2021)   Hunger Vital Sign    Worried About Running Out of Food in the Last Year: Never true    Ran Out of Food in the Last Year: Never true  Transportation Needs: No Transportation Needs (09/02/2021)   PRAPARE - Administrator, Civil Service (Medical): No    Lack of Transportation (Non-Medical): No  Physical Activity: Inactive (09/02/2021)   Exercise Vital Sign    Days of Exercise per Week: 0 days    Minutes of Exercise per Session: 0 min  Stress: No Stress Concern Present (09/02/2021)   [employer] of Occupational Health - Occupational Stress Questionnaire    Feeling of Stress : Not at all  Social Connections: Moderately Isolated (09/02/2021)   Social Connection and Isolation Panel [NHANES]    Frequency of Communication with Friends and Family: More than three times a week    Frequency of Social Gatherings with Friends and Family: More than three times a week    Attends Religious Services: Never    Database administrator or  Organizations: No    Attends Banker Meetings: Never    Marital Status: Married  Catering manager Violence: Not At Risk (09/02/2021)   Humiliation, Afraid, Rape, and Kick questionnaire    Fear of Current or Ex-Partner: No    Emotionally Abused: No    Physically Abused: No     Sexually Abused: No     Review of Systems     Objective:   Physical Exam Vitals reviewed.  Constitutional:      General: He is not in acute distress.    Appearance: Normal appearance. He is obese. He is not ill-appearing or toxic-appearing.  HENT:     Right Ear: Tympanic membrane and ear canal normal.     Left Ear: Tympanic membrane and ear canal normal.     Nose: Congestion and rhinorrhea present.     Right Turbinates: Enlarged.     Left Turbinates: Enlarged.     Right Sinus: Maxillary sinus tenderness and frontal sinus tenderness present.     Left Sinus: Maxillary sinus tenderness and frontal sinus tenderness present.     Mouth/Throat:     Mouth: Mucous membranes are moist.  Eyes:     General: Lids are normal.     Extraocular Movements:     Right eye: Normal extraocular motion.     Left eye: Normal extraocular motion.     Conjunctiva/sclera:     Left eye: Left conjunctiva is not injected.  Cardiovascular:     Rate and Rhythm: Normal rate and regular rhythm.     Pulses: Normal pulses.     Heart sounds: Normal heart sounds. No murmur heard.    No friction rub. No gallop.  Pulmonary:     Effort: Pulmonary effort is normal. No respiratory distress.     Breath sounds: Normal breath sounds. No stridor. No wheezing, rhonchi or rales.  Abdominal:     General: Abdomen is flat. Bowel sounds are normal. There is no distension.     Palpations: Abdomen is soft. There is no mass.     Tenderness: There is no abdominal tenderness. There is no guarding or rebound.     Hernia: No hernia is present.  Musculoskeletal:     Right lower leg: No edema.     Left lower leg: No edema.     Right foot: Normal range of motion and normal capillary refill. Tenderness present. No swelling, prominent metatarsal heads or crepitus.     Left foot: Normal range of motion and normal capillary refill. No swelling, tenderness or crepitus.  Skin:    Findings: No rash.  Neurological:     General: No focal  deficit present.     Mental Status: He is alert and oriented to person, place, and time. Mental status is at baseline.  Psychiatric:        Mood and Affect: Mood normal.        Behavior: Behavior normal.        Thought Content: Thought content normal.        Judgment: Judgment normal.           Assessment & Plan:  Fatigue, unspecified type - Plan: Hemoglobin A1c, CBC with Differential/Platelet, COMPLETE METABOLIC PANEL WITH GFR, TSH, Vitamin B12, Testosterone Total,Free,Bio, Males  Hypogonadism in male - Plan: PSA  Allergic rhinitis I discussed the risk of glucocorticoids with the patient however he elects to receive a cortisone injection today.  He received Depo-Medrol 60 mg IM x 1.  I will follow  this up with a prednisone taper pack.  This should help calm his allergies somewhat at least temporarily.  I am concerned that his fatigue could be related to hypogonadism.  I will check a baseline testosterone level and also get a baseline PSA.  Consider starting the patient back on testosterone depending on the value.  Also check a TSH, B12, CMP, CBC, and an A1c.  I believe his weight loss is due to his early satiety and his lack of an appetite.  However his weight is certainly not dangerous.  We discussed medication to increase his appetite but he declines this at present

## 2022-12-05 NOTE — Addendum Note (Signed)
Addended by: Venia Carbon K on: 12/05/2022 04:00 PM   Modules accepted: Orders

## 2022-12-06 ENCOUNTER — Other Ambulatory Visit: Payer: Self-pay

## 2022-12-06 ENCOUNTER — Other Ambulatory Visit: Payer: Medicare HMO

## 2022-12-06 DIAGNOSIS — R5383 Other fatigue: Secondary | ICD-10-CM | POA: Diagnosis not present

## 2022-12-06 LAB — CBC WITH DIFFERENTIAL/PLATELET
Absolute Monocytes: 728 cells/uL (ref 200–950)
Basophils Absolute: 53 cells/uL (ref 0–200)
Basophils Relative: 0.7 %
Eosinophils Absolute: 60 cells/uL (ref 15–500)
Eosinophils Relative: 0.8 %
HCT: 47.2 % (ref 38.5–50.0)
Hemoglobin: 15.1 g/dL (ref 13.2–17.1)
Lymphs Abs: 1673 cells/uL (ref 850–3900)
MCH: 27.7 pg (ref 27.0–33.0)
MCHC: 32 g/dL (ref 32.0–36.0)
MCV: 86.4 fL (ref 80.0–100.0)
MPV: 10 fL (ref 7.5–12.5)
Monocytes Relative: 9.7 %
Neutro Abs: 4988 cells/uL (ref 1500–7800)
Neutrophils Relative %: 66.5 %
Platelets: 180 10*3/uL (ref 140–400)
RBC: 5.46 10*6/uL (ref 4.20–5.80)
RDW: 14.5 % (ref 11.0–15.0)
Total Lymphocyte: 22.3 %
WBC: 7.5 10*3/uL (ref 3.8–10.8)

## 2022-12-06 LAB — TESTOSTERONE TOTAL,FREE,BIO, MALES
Albumin: 4.4 g/dL (ref 3.6–5.1)
Sex Hormone Binding: 42 nmol/L (ref 22–77)
Testosterone, Bioavailable: 100.5 ng/dL (ref 15.0–150.0)
Testosterone, Free: 49.9 pg/mL (ref 6.0–73.0)
Testosterone: 458 ng/dL (ref 250–827)

## 2022-12-06 LAB — COMPLETE METABOLIC PANEL WITH GFR
AG Ratio: 2 (calc) (ref 1.0–2.5)
ALT: 32 U/L (ref 9–46)
AST: 34 U/L (ref 10–35)
Albumin: 4.4 g/dL (ref 3.6–5.1)
Alkaline phosphatase (APISO): 140 U/L (ref 35–144)
BUN: 13 mg/dL (ref 7–25)
CO2: 31 mmol/L (ref 20–32)
Calcium: 10.8 mg/dL — ABNORMAL HIGH (ref 8.6–10.3)
Chloride: 97 mmol/L — ABNORMAL LOW (ref 98–110)
Creat: 0.8 mg/dL (ref 0.70–1.28)
Globulin: 2.2 g/dL (calc) (ref 1.9–3.7)
Glucose, Bld: 135 mg/dL — ABNORMAL HIGH (ref 65–99)
Potassium: 4.4 mmol/L (ref 3.5–5.3)
Sodium: 136 mmol/L (ref 135–146)
Total Bilirubin: 0.9 mg/dL (ref 0.2–1.2)
Total Protein: 6.6 g/dL (ref 6.1–8.1)
eGFR: 92 mL/min/{1.73_m2} (ref >=60)

## 2022-12-06 LAB — HEMOGLOBIN A1C
Hgb A1c MFr Bld: 7.1 % of total Hgb — ABNORMAL HIGH (ref <5.7)
Mean Plasma Glucose: 157 mg/dL
eAG (mmol/L): 8.7 mmol/L

## 2022-12-06 LAB — TSH: TSH: 1.32 mIU/L (ref 0.40–4.50)

## 2022-12-06 LAB — VITAMIN B12: Vitamin B-12: 581 pg/mL (ref 200–1100)

## 2022-12-06 LAB — PSA: PSA: 0.34 ng/mL (ref <=4.00)

## 2022-12-07 LAB — PARATHYROID HORMONE, INTACT (NO CA): PTH: 74 pg/mL (ref 16–77)

## 2022-12-08 ENCOUNTER — Other Ambulatory Visit: Payer: Self-pay

## 2022-12-08 ENCOUNTER — Other Ambulatory Visit: Payer: Medicare HMO

## 2022-12-08 ENCOUNTER — Other Ambulatory Visit: Payer: Self-pay | Admitting: Family Medicine

## 2022-12-08 NOTE — Telephone Encounter (Signed)
Requested Prescriptions  Pending Prescriptions Disp Refills   citalopram (CELEXA) 10 MG tablet [Pharmacy Med Name: CITALOPRAM HBR 10 MG TABLET] 90 tablet 1    Sig: TAKE 1 TABLET BY MOUTH EVERY DAY     Psychiatry:  Antidepressants - SSRI Failed - 12/08/2022  1:44 AM      Failed - Valid encounter within last 6 months    Recent Outpatient Visits           1 year ago Right groin pain   [facility] Donita Brooks, MD   1 year ago Type 2 diabetes mellitus with hyperlipidemia (HCC)   [facility] Pickard, Priscille Heidelberg, MD   1 year ago Colon cancer screening   [facility] Donita Brooks, MD   1 year ago Hypercalcemia   [facility] Donita Brooks, MD   1 year ago Seasonal allergies   [facility] Pickard, Priscille Heidelberg, MD       Future Appointments             In 1 month Shari Prows, Kathlynn Grate, MD [facility]

## 2022-12-12 ENCOUNTER — Ambulatory Visit (INDEPENDENT_AMBULATORY_CARE_PROVIDER_SITE_OTHER): Payer: Medicare HMO | Admitting: Family Medicine

## 2022-12-12 VITALS — BP 128/72 | HR 74 | Temp 98.0°F | Ht 71.0 in | Wt 193.0 lb

## 2022-12-12 DIAGNOSIS — B370 Candidal stomatitis: Secondary | ICD-10-CM

## 2022-12-12 DIAGNOSIS — M6281 Muscle weakness (generalized): Secondary | ICD-10-CM

## 2022-12-12 MED ORDER — NYSTATIN 100000 UNIT/ML MT SUSP: 5.0000 mL | Freq: Four times a day (QID) | OROMUCOSAL | 0 refills | Status: DC

## 2022-12-12 NOTE — Progress Notes (Signed)
Subjective:    Patient ID: Cameron Daniel, male    DOB: 04-01-1946, 77 y.o.   MRN: [id number]   Patient recently took prednisone for allergies.  Shortly thereafter he developed a white plaque-like rash in the back of his throat.  The patient has thrush.  There are clusters of pink-white plaques on the peritonsillar fold bilaterally.  He reports that it is tender and sore and is causing his throat to hurt.  He also continues to report fatigue and muscle weakness.  He is requesting testosterone.  I reviewed his labs with him and his testosterone levels are normal.  He states that his muscles are simply wasting away.  Specifically he reports atrophy in his hamstrings and quads bilaterally.  He is on high-dose statin due to his history of coronary artery disease.  He does endorse that his muscles are sore.  He is concerned about weight loss.  He had a CAT scan scan of his abdomen and pelvis recently that showed no malignancy.  He also had an EGD and colonoscopy that showed no malignancy.  I performed a chest x-ray in December that showed no malignancy.  His recent blood work was normal. Past Medical History:  Diagnosis Date   Allergic rhinitis, cause unspecified    Allergy    Anxiety    Arthritis    Cancer    a couple places removed from eyelid   Carotid Doppler 04/2020   Carotid US 9/21: No evidence of ICA stenosis bilaterally; right vertebral artery stenosis   Cataract    Chronic combined systolic (congestive) and diastolic (congestive) heart failure    Coronary atherosclerosis of unspecified type of vessel, native or graft    a. MI 1985 with unclear details. b. CABG 03/2019 with LIMA -> LAD, SVG -> OM, SVG to dRCA.   Depression    PTSD    Diverticulosis of colon (without mention of hemorrhage)    Esophageal reflux    Esophageal stricture    Essential hypertension    Former tobacco use    Hemorrhoids    Hyperlipidemia    ICD (implantable cardioverter-defibrillator) in place     Irritable bowel syndrome    Ischemic cardiomyopathy    Mild carotid artery disease    a. 1-39% bilaterally 03/2019 duplex.   Myocardial infarction    Nocturia    Obesity, unspecified    Orthostasis    Other acquired absence of organ    Personal history of colonic polyps    Psychosexual dysfunction with inhibited sexual excitement    Retroperitoneal bleed    Type II or unspecified type diabetes mellitus without mention of complication, not stated as uncontrolled    VT (ventricular tachycardia)    Past Surgical History:  Procedure Laterality Date   ADENOIDECTOMY     CARPAL TUNNEL RELEASE     left    CHOLECYSTECTOMY     COLON RESECTION     18 inches   COLON SURGERY  2013   CORONARY ANGIOPLASTY     1985    CORONARY ARTERY BYPASS GRAFT N/A 04/26/2019   Procedure: CORONARY ARTERY BYPASS GRAFTING (CABG) x Three, using left internal mammary artery and right leg greater saphenous vein harvested endoscopically;  Surgeon: Alleen Borne, MD;  Location: [facility] OR;  Service: Open Heart Surgery;  Laterality: N/A;   ELBOW BURSA SURGERY     EYE SURGERY     ICD IMPLANT N/A 06/25/2020   Procedure: ICD IMPLANT;  Surgeon: Lanier Prude, MD;  Location: [facility] IN[facility];  Service: Cardiovascular;  Laterality: N/A;   LEFT HEART CATH AND CORS/GRAFTS ANGIOGRAPHY N/A 06/19/2020   Procedure: LEFT HEART CATH AND CORS/GRAFTS ANGIOGRAPHY;  Surgeon: Lennette Bihari, MD;  Location: [facility];  Service: Cardiovascular;  Laterality: N/A;   POLYPECTOMY     RIGHT/LEFT HEART CATH AND CORONARY ANGIOGRAPHY N/A 04/16/2019   Procedure: RIGHT/LEFT HEART CATH AND CORONARY ANGIOGRAPHY;  Surgeon: Lyn Records, MD;  Location: [facility];  Service: Cardiovascular;  Laterality: N/A;   SHOULDER SURGERY     Rt. Shoulder   TEE WITHOUT CARDIOVERSION N/A 04/26/2019   Procedure: TRANSESOPHAGEAL ECHOCARDIOGRAM (TEE);  Surgeon: Alleen Borne, MD;  Location: [facility] OR;  Service: Open Heart Surgery;  Laterality: N/A;    TONSILLECTOMY     V TACH ABLATION N/A 06/22/2020   Procedure: V TACH ABLATION;  Surgeon: Lanier Prude, MD;  Location: [facility];  Service: Cardiovascular;  Laterality: N/A;   Current Outpatient Medications on File Prior to Visit  Medication Sig Dispense Refill   acetaminophen (TYLENOL) 500 MG tablet Take 1,000 mg by mouth at bedtime as needed for mild pain.     aspirin EC 81 MG tablet Take 1 tablet (81 mg total) by mouth daily. 90 tablet 3   azelastine (ASTELIN) 0.1 % nasal spray Place 2 sprays into both nostrils 2 (two) times daily. Use in each nostril as directed 30 mL 12   carvedilol (COREG) 12.5 MG tablet Take one tablet in the AM and Take two tablets in the PM     Cetirizine HCl (ZERVIATE) 0.24 % SOLN Apply 1 drop to eye 2 (two) times daily. 30 each 3   citalopram (CELEXA) 10 MG tablet TAKE 1 TABLET BY MOUTH EVERY DAY 90 tablet 1   colestipol (COLESTID) 5 g packet Take 5 g by mouth daily. 30 each 11   diphenoxylate-atropine (LOMOTIL) 2.5-0.025 MG tablet Take 2 tablets by mouth 4 (four) times daily as needed for diarrhea or loose stools. 30 tablet 0   empagliflozin (JARDIANCE) 25 MG TABS tablet TAKE 25 MG (1 TABLET) BY MOUTH DAILY BEFORE BREAKFAST. STOP PIOGLITAZONE 90 tablet 1   famotidine (PEPCID) 20 MG tablet TAKE 1 TABLET BY MOUTH EVERYDAY AT BEDTIME 90 tablet 1   fexofenadine (ALLEGRA) 180 MG tablet Take 1 tablet (180 mg total) by mouth daily as needed. For allergies 90 tablet 0   finasteride (PROSCAR) 5 MG tablet TAKE 1 TABLET BY MOUTH EVERY DAY 90 tablet 3   hydrocortisone (ANUSOL-HC) 2.5 % rectal cream Place 1 Application rectally at bedtime. 30 g 1   magnesium oxide (MAG-OX) 400 MG tablet Take 1 tablet (400 mg total) by mouth daily. 90 tablet 3   metroNIDAZOLE (FLAGYL) 250 MG tablet Take 1 tablet (250 mg total) by mouth 3 (three) times daily. 30 tablet 1   montelukast (SINGULAIR) 10 MG tablet TAKE 1 TABLET BY MOUTH EVERYDAY AT BEDTIME 90 tablet 3   nystatin cream  (MYCOSTATIN) Apply 1 Application topically 3 (three) times daily. 30 g 1   pantoprazole (PROTONIX) 40 MG tablet Take 1 tablet (40 mg total) by mouth 2 (two) times daily. 60 tablet 11   predniSONE (DELTASONE) 20 MG tablet 3 tabs poqday 1-2, 2 tabs poqday 3-4, 1 tab poqday 5-6 12 tablet 0   rosuvastatin (CRESTOR) 40 MG tablet TAKE 1 TABLET BY MOUTH DAILY AT 6 PM. 90 tablet 3   sitaGLIPtin (JANUVIA) 50 MG tablet Take 1 tablet (50  mg total) by mouth daily. 90 tablet 1   vitamin B-12 (CYANOCOBALAMIN) 100 MCG tablet Take 100 mcg by mouth daily.     No current facility-administered medications on file prior to visit.   Allergies  Allergen Reactions   Doxazosin Mesylate Other (See Comments)    Drops in blood pressure    Losartan     Other reaction(s): Muscle pain   Testosterone     Other reaction(s): Erythrocytosis Other reaction(s): Erythrocytosis   Flomax [Tamsulosin Hcl] Itching and Other (See Comments)    Drops in blood pressure     Penicillins Rash and Other (See Comments)    Has patient had a PCN reaction causing immediate rash, facial/tongue/throat swelling, SOB or lightheadedness with hypotension: No Has patient had a PCN reaction causing severe rash involving mucus membranes or skin necrosis: No Has patient had a PCN reaction that required hospitalization No Has patient had a PCN reaction occurring within the last 10 years: No If all of the above answers are "NO", then may proceed with Cephalosporin use.    Sulfonamide Derivatives Rash   Trulicity [Dulaglutide] Nausea And Vomiting    N&V, Dizziness   Social History   Socioeconomic History   Marital status: Married    Spouse name: Not on file   Number of children: 1   Years of education: 12   Highest education level: High school graduate  Occupational History   Occupation: [employer]- retired  Tobacco Use   Smoking status: Former    Types: Cigarettes    Quit date: 08/30/1983    Years since quitting: 39.3   Smokeless tobacco:  Former   Tobacco comments:    Quit 30 years ago.  Vaping Use   Vaping Use: Never used  Substance and Sexual Activity   Alcohol use: Yes    Comment: rare   Drug use: No   Sexual activity: Yes  Other Topics Concern   Not on file  Social History Narrative   Not on file   Social Determinants of Health   Financial Resource Strain: Low Risk  (09/02/2021)   Overall Financial Resource Strain (CARDIA)    Difficulty of Paying Living Expenses: Not hard at all  Food Insecurity: No Food Insecurity (09/02/2021)   Hunger Vital Sign    Worried About Running Out of Food in the Last Year: Never true    Ran Out of Food in the Last Year: Never true  Transportation Needs: No Transportation Needs (09/02/2021)   PRAPARE - Administrator, Civil Service (Medical): No    Lack of Transportation (Non-Medical): No  Physical Activity: Inactive (09/02/2021)   Exercise Vital Sign    Days of Exercise per Week: 0 days    Minutes of Exercise per Session: 0 min  Stress: No Stress Concern Present (09/02/2021)   [employer] of Occupational Health - Occupational Stress Questionnaire    Feeling of Stress : Not at all  Social Connections: Moderately Isolated (09/02/2021)   Social Connection and Isolation Panel [NHANES]    Frequency of Communication with Friends and Family: More than three times a week    Frequency of Social Gatherings with Friends and Family: More than three times a week    Attends Religious Services: Never    Database administrator or Organizations: No    Attends Banker Meetings: Never    Marital Status: Married  Catering manager Violence: Not At Risk (09/02/2021)   Humiliation, Afraid, Rape, and Kick questionnaire  Fear of Current or Ex-Partner: No    Emotionally Abused: No    Physically Abused: No    Sexually Abused: No     Review of Systems     Objective:   Physical Exam Vitals reviewed.  Constitutional:      General: He is not in acute distress.     Appearance: Normal appearance. He is obese. He is not ill-appearing or toxic-appearing.  HENT:     Mouth/Throat:     Pharynx: Oropharyngeal exudate and posterior oropharyngeal erythema present.  Eyes:     General: Lids are normal.     Extraocular Movements:     Right eye: Normal extraocular motion.     Left eye: Normal extraocular motion.     Conjunctiva/sclera:     Left eye: Left conjunctiva is not injected.  Cardiovascular:     Rate and Rhythm: Normal rate and regular rhythm.     Pulses: Normal pulses.     Heart sounds: Normal heart sounds. No murmur heard.    No friction rub. No gallop.  Pulmonary:     Effort: Pulmonary effort is normal. No respiratory distress.     Breath sounds: Normal breath sounds. No stridor. No wheezing, rhonchi or rales.  Abdominal:     General: Abdomen is flat. Bowel sounds are normal. There is no distension.     Palpations: Abdomen is soft. There is no mass.     Tenderness: There is no abdominal tenderness. There is no guarding or rebound.     Hernia: No hernia is present.  Musculoskeletal:     Right lower leg: No edema.     Left lower leg: No edema.     Right foot: Normal range of motion and normal capillary refill. Tenderness present. No swelling, prominent metatarsal heads or crepitus.     Left foot: Normal range of motion and normal capillary refill. No swelling, tenderness or crepitus.  Skin:    Findings: No rash.  Neurological:     General: No focal deficit present.     Mental Status: He is alert and oriented to person, place, and time. Mental status is at baseline.  Psychiatric:        Mood and Affect: Mood normal.        Behavior: Behavior normal.        Thought Content: Thought content normal.        Judgment: Judgment normal.           Assessment & Plan:  Thrush  Muscle weakness Patient has had an extensive workup repeat labs 4 months showed no kidney malignancy.  His lab work has been normal.  Therefore I do not see any  concerning findings to explain his weight loss.  I am concerned that his muscle weakness could potentially be due to statin induced myopathy.  I will have the patient temporarily hold Crestor for 2 weeks to see if the muscle weakness and pain in his hamstrings and quads improved.  I will treat the thrush in his throat with nystatin 1 teaspoon 4 times daily for 7 days

## 2022-12-13 NOTE — Progress Notes (Signed)
  Electrophysiology Office Note:   Date:  12/14/2022  ID:  Cameron, Daniel 1945/11/01, MRN [id number]  Primary Cardiologist: Meriam Sprague, MD Electrophysiologist: Lanier Prude, MD   History of Present Illness:   Cameron Daniel is a 77 y.o. male with h/o chronic systolic CHF s/p MDT single chamber, VT, CAD s/p CABG, and SVT seen today for routine electrophysiology followup. Since last being seen in our clinic the patient reports doing very well overall. Has been struggling with allergies, and got thrush after treatment.  he denies chest pain, palpitations, dyspnea, PND, orthopnea, nausea, vomiting, dizziness, syncope, edema, weight gain, or early satiety.   Review of systems complete and found to be negative unless listed in HPI.   Device History: Medtronic Single Chamber ICD implanted 05/2020 for secondary prevention  Studies Reviewed:    ICD Interrogation-  reviewed in detail today,  See PACEART report.  EKG is ordered today. Personal review shows NSR at 63 bpm   Risk Assessment/Calculations:           Physical Exam:   VS:  BP 118/70   Pulse 72   Ht  (1.803 m)   Wt 191 lb 12.8 oz (87 kg)   SpO2 97%   BMI 26.75 kg/m    Wt Readings from Last 3 Encounters:  12/14/22 191 lb 12.8 oz (87 kg)  12/12/22 193 lb (87.5 kg)  12/05/22 194 lb 6.4 oz (88.2 kg)     GEN: Well nourished, well developed in no acute distress NECK: No JVD; No carotid bruits CARDIAC: Regular rate and rhythm, no murmurs, rubs, gallops RESPIRATORY:  Clear to auscultation without rales, wheezing or rhonchi  ABDOMEN: Soft, non-tender, non-distended EXTREMITIES:  No edema; No deformity   ASSESSMENT AND PLAN:    Chronic systolic dysfunction s/p Medtronic single chamber ICD  euvolemic today Stable on an appropriate medical regimen Normal ICD function See Pace Art report No changes today  VT Have previously reviewed with MD and suspect more likely aberrantly conducted SVT  CAD  s/p CABG No s/s ischemia  SVT Overall quiescent. One short episode (10 beats) last month that could be NSVT vs brief SVT. Continue coreg 12.5 mg BID.    Disposition:   Follow up with Dr. Lalla Brothers in 12 months   Signed, Graciella Freer, PA-C

## 2022-12-14 ENCOUNTER — Telehealth: Payer: Self-pay | Admitting: Family Medicine

## 2022-12-14 ENCOUNTER — Encounter: Payer: Self-pay | Admitting: Student

## 2022-12-14 ENCOUNTER — Ambulatory Visit: Payer: Medicare HMO | Attending: Student | Admitting: Student

## 2022-12-14 VITALS — BP 118/70 | HR 72 | Ht 71.0 in | Wt 191.8 lb

## 2022-12-14 DIAGNOSIS — I2510 Atherosclerotic heart disease of native coronary artery without angina pectoris: Secondary | ICD-10-CM | POA: Diagnosis not present

## 2022-12-14 DIAGNOSIS — I472 Ventricular tachycardia, unspecified: Secondary | ICD-10-CM | POA: Diagnosis not present

## 2022-12-14 DIAGNOSIS — R011 Cardiac murmur, unspecified: Secondary | ICD-10-CM

## 2022-12-14 LAB — CUP PACEART INCLINIC DEVICE CHECK
Battery Remaining Longevity: 121 mo
Battery Voltage: 3.01 V
Brady Statistic RV Percent Paced: 0.02 %
Date Time Interrogation Session: 20240417102735
HighPow Impedance: 73 Ohm
Implantable Lead Connection Status: 753985
Implantable Lead Implant Date: 20211028
Implantable Lead Location: 753860
Implantable Pulse Generator Implant Date: 20211028
Lead Channel Impedance Value: 399 Ohm
Lead Channel Impedance Value: 456 Ohm
Lead Channel Pacing Threshold Amplitude: 0.625 V
Lead Channel Pacing Threshold Pulse Width: 0.4 ms
Lead Channel Sensing Intrinsic Amplitude: 22.375 mV
Lead Channel Sensing Intrinsic Amplitude: 24.5 mV
Lead Channel Setting Pacing Amplitude: 2 V
Lead Channel Setting Pacing Pulse Width: 0.4 ms
Lead Channel Setting Sensing Sensitivity: 0.3 mV
Zone Setting Status: 755011
Zone Setting Status: 755011

## 2022-12-14 NOTE — Telephone Encounter (Signed)
Contacted Cameron Daniel to schedule their annual wellness visit. Appointment made for 12/22/2022.  Thank you,  Judeth Cornfield,  AMB Clinical Support [facility] AW[facility] Program Direct Dial ??[phone]

## 2022-12-14 NOTE — Patient Instructions (Signed)
Medication Instructions:  Your physician recommends that you continue on your current medications as directed. Please refer to the Current Medication list given to you today.  *If you need a refill on your cardiac medications before your next appointment, please call your pharmacy*  Lab Work: None ordered If you have labs (blood work) drawn today and your tests are completely normal, you will receive your results only by: MyChart Message (if you have MyChart) OR A paper copy in the mail If you have any lab test that is abnormal or we need to change your treatment, we will call you to review the results.  Follow-Up: At [facility], you and your health needs are our priority.  As part of our continuing mission to provide you with exceptional heart care, we have created designated Provider Care Teams.  These Care Teams include your primary Cardiologist (physician) and Advanced Practice Providers (APPs -  Physician Assistants and Nurse Practitioners) who all work together to provide you with the care you need, when you need it.  We recommend signing up for the patient portal called "MyChart".  Sign up information is provided on this After Visit Summary.  MyChart is used to connect with patients for Virtual Visits (Telemedicine).  Patients are able to view lab/test results, encounter notes, upcoming appointments, etc.  Non-urgent messages can be sent to your provider as well.   To learn more about what you can do with MyChart, go to [email or web address].    Your next appointment:   1 year(s)  Provider:   Steffanie Dunn, MD

## 2022-12-20 LAB — PTH-RELATED PEPTIDE: PTH-Related Protein (PTH-RP): 8 pg/mL — ABNORMAL LOW (ref 11–20)

## 2022-12-21 DIAGNOSIS — H029 Unspecified disorder of eyelid: Secondary | ICD-10-CM | POA: Diagnosis not present

## 2022-12-21 NOTE — Progress Notes (Unsigned)
Subjective:   Cameron Daniel is a 77 y.o. male who presents for Medicare Annual/Subsequent preventive examination.  Review of Systems    ***       Objective:    There were no vitals filed for this visit. There is no height or weight on file to calculate BMI.     09/02/2021   10:47 AM 02/22/2021    2:18 PM 06/19/2020    8:53 AM 04/15/2019    9:00 AM 04/12/2019    1:47 PM 04/11/2019   12:43 PM 02/15/2019   10:09 AM  Advanced Directives  Does Patient Have a Medical Advance Directive? Yes Yes No Yes Yes Yes No  Type of Estate agent of [place];Living will Healthcare Power of [employer] Power of [employer] Power of [employer] Power of Attorney   Does patient want to make changes to medical advance directive?  Yes (MAU/Ambulatory/Procedural Areas - Information given)  No - Patient declined  No - Patient declined   Copy of Healthcare Power of Attorney in Chart? No - copy requested No - copy requested       Would patient like information on creating a medical advance directive?   No - Patient declined    No - Patient declined    Current Medications (verified) Outpatient Encounter Medications as of 12/22/2022  Medication Sig   acetaminophen (TYLENOL) 500 MG tablet Take 1,000 mg by mouth at bedtime as needed for mild pain.   aspirin EC 81 MG tablet Take 1 tablet (81 mg total) by mouth daily.   azelastine (ASTELIN) 0.1 % nasal spray Place 2 sprays into both nostrils 2 (two) times daily. Use in each nostril as directed   carvedilol (COREG) 12.5 MG tablet Take one tablet in the AM and Take two tablets in the PM   Cetirizine HCl (ZERVIATE) 0.24 % SOLN Apply 1 drop to eye 2 (two) times daily.   citalopram (CELEXA) 10 MG tablet TAKE 1 TABLET BY MOUTH EVERY DAY   colestipol (COLESTID) 5 g packet Take 5 g by mouth daily.   diphenoxylate-atropine (LOMOTIL) 2.5-0.025 MG tablet Take 2 tablets by mouth 4 (four) times daily as needed for diarrhea or  loose stools. (Patient not taking: Reported on 12/14/2022)   empagliflozin (JARDIANCE) 25 MG TABS tablet TAKE 25 MG (1 TABLET) BY MOUTH DAILY BEFORE BREAKFAST. STOP PIOGLITAZONE   famotidine (PEPCID) 20 MG tablet TAKE 1 TABLET BY MOUTH EVERYDAY AT BEDTIME   fexofenadine (ALLEGRA) 180 MG tablet Take 1 tablet (180 mg total) by mouth daily as needed. For allergies   finasteride (PROSCAR) 5 MG tablet TAKE 1 TABLET BY MOUTH EVERY DAY   hydrocortisone (ANUSOL-HC) 2.5 % rectal cream Place 1 Application rectally at bedtime.   magnesium oxide (MAG-OX) 400 MG tablet Take 1 tablet (400 mg total) by mouth daily.   metroNIDAZOLE (FLAGYL) 250 MG tablet Take 1 tablet (250 mg total) by mouth 3 (three) times daily.   montelukast (SINGULAIR) 10 MG tablet TAKE 1 TABLET BY MOUTH EVERYDAY AT BEDTIME   nystatin (MYCOSTATIN) 100000 UNIT/ML suspension Take 5 mLs (500,000 Units total) by mouth 4 (four) times daily.   nystatin cream (MYCOSTATIN) Apply 1 Application topically 3 (three) times daily.   pantoprazole (PROTONIX) 40 MG tablet Take 1 tablet (40 mg total) by mouth 2 (two) times daily.   predniSONE (DELTASONE) 20 MG tablet 3 tabs poqday 1-2, 2 tabs poqday 3-4, 1 tab poqday 5-6 (Patient not taking: Reported on 12/14/2022)  rosuvastatin (CRESTOR) 40 MG tablet TAKE 1 TABLET BY MOUTH DAILY AT 6 PM.   sitaGLIPtin (JANUVIA) 50 MG tablet Take 1 tablet (50 mg total) by mouth daily.   vitamin B-12 (CYANOCOBALAMIN) 100 MCG tablet Take 100 mcg by mouth daily.   No facility-administered encounter medications on file as of 12/22/2022.    Allergies (verified) Doxazosin mesylate, Losartan, Testosterone, Flomax [tamsulosin hcl], Penicillins, Sulfonamide derivatives, and Trulicity [dulaglutide]   History: Past Medical History:  Diagnosis Date   Allergic rhinitis, cause unspecified    Allergy    Anxiety    Arthritis    Cancer    a couple places removed from eyelid   Carotid Doppler 04/2020   Carotid US 9/21: No evidence  of ICA stenosis bilaterally; right vertebral artery stenosis   Cataract    Chronic combined systolic (congestive) and diastolic (congestive) heart failure    Coronary atherosclerosis of unspecified type of vessel, native or graft    a. MI 1985 with unclear details. b. CABG 03/2019 with LIMA -> LAD, SVG -> OM, SVG to dRCA.   Depression    PTSD    Diverticulosis of colon (without mention of hemorrhage)    Esophageal reflux    Esophageal stricture    Essential hypertension    Former tobacco use    Hemorrhoids    Hyperlipidemia    ICD (implantable cardioverter-defibrillator) in place    Irritable bowel syndrome    Ischemic cardiomyopathy    Mild carotid artery disease    a. 1-39% bilaterally 03/2019 duplex.   Myocardial infarction    Nocturia    Obesity, unspecified    Orthostasis    Other acquired absence of organ    Personal history of colonic polyps    Psychosexual dysfunction with inhibited sexual excitement    Retroperitoneal bleed    Type II or unspecified type diabetes mellitus without mention of complication, not stated as uncontrolled    VT (ventricular tachycardia)    Past Surgical History:  Procedure Laterality Date   ADENOIDECTOMY     CARPAL TUNNEL RELEASE     left    CHOLECYSTECTOMY     COLON RESECTION     18 inches   COLON SURGERY  2013   CORONARY ANGIOPLASTY     1985    CORONARY ARTERY BYPASS GRAFT N/A 04/26/2019   Procedure: CORONARY ARTERY BYPASS GRAFTING (CABG) x Three, using left internal mammary artery and right leg greater saphenous vein harvested endoscopically;  Surgeon: Alleen Borne, MD;  Location: [facility] OR;  Service: Open Heart Surgery;  Laterality: N/A;   ELBOW BURSA SURGERY     EYE SURGERY     ICD IMPLANT N/A 06/25/2020   Procedure: ICD IMPLANT;  Surgeon: Lanier Prude, MD;  Location[facility] INVASIVE CV LAB;  Service: Cardiovascular;  Laterality: N/A;   LEFT HEART CATH AND CORS/GRAFTS ANGIOGRAPHY N/A 06/19/2020   Procedure: LEFT HEART CATH AND  CORS/GRAFTS ANGIOGRAPHY;  Surgeon: Lennette Bihari, MD;  Location: [facility] INVASIVE CV LAB;  Service: Cardiovascular;  Laterality: N/A;   POLYPECTOMY     RIGHT/LEFT HEART CATH AND CORONARY ANGIOGRAPHY N/A 04/16/2019   Procedure: RIGHT/LEFT HEART CATH AND CORONARY ANGIOGRAPHY;  Surgeon: Lyn Records, MD;  Location: [facility] INVASIVE CV LAB;  Service: Cardiovascular;  Laterality: N/A;   SHOULDER SURGERY     Rt. Shoulder   TEE WITHOUT CARDIOVERSION N/A 04/26/2019   Procedure: TRANSESOPHAGEAL ECHOCARDIOGRAM (TEE);  Surgeon: Alleen Borne, MD;  Location: [facility] OR;  Service: Open Heart  Surgery;  Laterality: N/A;   TONSILLECTOMY     V TACH ABLATION N/A 06/22/2020   Procedure: V TACH ABLATION;  Surgeon: Lanier Prude, MD;  Location: [facility] INVASIVE CV LAB;  Service: Cardiovascular;  Laterality: N/A;   Family History  Problem Relation Age of Onset   Lung cancer Father    Allergic rhinitis Father    Coronary artery disease Mother    Colon cancer Neg Hx    Heart attack Neg Hx    Hypertension Neg Hx    Stroke Neg Hx    Esophageal cancer Neg Hx    Rectal cancer Neg Hx    Stomach cancer Neg Hx    Pancreatic cancer Neg Hx    Social History   Socioeconomic History   Marital status: Married    Spouse name: Not on file   Number of children: 1   Years of education: 12   Highest education level: High school graduate  Occupational History   Occupation: Therapist, sports- retired  Tobacco Use   Smoking status: Former    Types: Cigarettes    Quit date: 08/30/1983    Years since quitting: 39.3   Smokeless tobacco: Former   Tobacco comments:    Quit 30 years ago.  Vaping Use   Vaping Use: Never used  Substance and Sexual Activity   Alcohol use: Yes    Comment: rare   Drug use: No   Sexual activity: Yes  Other Topics Concern   Not on file  Social History Narrative   Not on file   Social Determinants of Health   Financial Resource Strain: Low Risk  (09/02/2021)   Overall Financial Resource Strain (CARDIA)     Difficulty of Paying Living Expenses: Not hard at all  Food Insecurity: No Food Insecurity (09/02/2021)   Hunger Vital Sign    Worried About Running Out of Food in the Last Year: Never true    Ran Out of Food in the Last Year: Never true  Transportation Needs: No Transportation Needs (09/02/2021)   PRAPARE - Administrator, Civil Service (Medical): No    Lack of Transportation (Non-Medical): No  Physical Activity: Inactive (09/02/2021)   Exercise Vital Sign    Days of Exercise per Week: 0 days    Minutes of Exercise per Session: 0 min  Stress: No Stress Concern Present (09/02/2021)   [employer] of Occupational Health - Occupational Stress Questionnaire    Feeling of Stress : Not at all  Social Connections: Moderately Isolated (09/02/2021)   Social Connection and Isolation Panel [NHANES]    Frequency of Communication with Friends and Family: More than three times a week    Frequency of Social Gatherings with Friends and Family: More than three times a week    Attends Religious Services: Never    Database administrator or Organizations: No    Attends Banker Meetings: Never    Marital Status: Married    Tobacco Counseling Counseling given: Not Answered Tobacco comments: Quit 30 years ago.   Clinical Intake:                 Diabetic?Yes   Nutrition Risk Assessment:  Has the patient had any N/V/D within the last 2 months?  {YES/NO:21197} Does the patient have any non-healing wounds?  {YES/NO:21197} Has the patient had any unintentional weight loss or weight gain?  {YES/NO:21197}  Diabetes:  Is the patient diabetic?  {YES/NO:21197} If diabetic, was a CBG obtained today?  {  YES/NO:21197} Did the patient bring in their glucometer from home?  {YES/NO:21197} How often do you monitor your CBG's? ***.   Financial Strains and Diabetes Management:  Are you having any financial strains with the device, your supplies or your medication? {YES/NO:21197}.   Does the patient want to be seen by Chronic Care Management for management of their diabetes?  {YES/NO:21197} Would the patient like to be referred to a Nutritionist or for Diabetic Management?  {YES/NO:21197}  Diabetic Exams:  {Diabetic Eye Exam:2101801} {Diabetic Foot Exam:2101802}          Activities of Daily Living     No data to display          Patient Care Team: Donita Brooks, MD as PCP - General (Family Medicine) Bensimhon, Bevelyn Buckles, MD as PCP - Advanced Heart Failure (Cardiology) Lanier Prude, MD as PCP - Electrophysiology (Cardiology) Meriam Sprague, MD as PCP - Cardiology (Cardiology) Erroll Luna, [facility] as Pharmacist (Pharmacist)  Indicate any recent Medical Services you may have received from other than [facility] providers in the past year (date may be approximate).     Assessment:   This is a routine wellness examination for [place].  Hearing/Vision screen No results found.  Dietary issues and exercise activities discussed:     Goals Addressed   None    Depression Screen    08/02/2022   12:23 PM 07/25/2022   11:44 AM 05/10/2022   10:48 AM 02/08/2022   10:45 AM 09/02/2021   10:44 AM 02/22/2021    1:42 PM 08/27/2020    9:25 AM  PHQ 2/9 Scores  PHQ - 2 Score 0 0 0 0 0 1 6  PHQ- 9 Score 0 0     14    Fall Risk    08/02/2022   12:22 PM 07/25/2022   11:45 AM 02/08/2022   10:45 AM 09/02/2021   10:47 AM 02/22/2021    1:41 PM  Fall Risk   Falls in the past year? 0 0 0 0 0  Number falls in past yr: 0   0   Injury with Fall? 0   0   Risk for fall due to : No Fall Risks   Impaired balance/gait;Impaired mobility   Follow up Falls prevention discussed  Falls evaluation completed Falls prevention discussed     FALL RISK PREVENTION PERTAINING TO THE HOME:  Any stairs in or around the home? {YES/NO:21197} If so, are there any without handrails? {YES/NO:21197} Home free of loose throw rugs in walkways, pet beds, electrical cords, etc?  {YES/NO:21197} Adequate lighting in your home to reduce risk of falls? {YES/NO:21197}  ASSISTIVE DEVICES UTILIZED TO PREVENT FALLS:  Life alert? {YES/NO:21197} Use of a cane, walker or w/c? {YES/NO:21197} Grab bars in the bathroom? {YES/NO:21197} Shower chair or bench in shower? {YES/NO:21197} Elevated toilet seat or a handicapped toilet? {YES/NO:21197}  TIMED UP AND GO:  Was the test performed? No . Telephonic visit   Cognitive Function:        09/02/2021   10:49 AM  6CIT Screen  What Year? 0 points  What month? 0 points  What time? 0 points  Count back from 20 0 points  Months in reverse 0 points  Repeat phrase 0 points  Total Score 0 points    Immunizations Immunization History  Administered Date(s) Administered   Fluad Quad(high Dose 65+) 06/07/2019, 07/02/2020, 05/31/2021, 06/08/2022   H1N1 07/29/2008   Influenza, High Dose Seasonal PF 06/09/2015, 06/08/2022   Influenza, Seasonal,  Injecte, Preservative Fre 07/03/2009, 06/02/2010, 06/27/2011, 06/03/2014   Influenza,inj,Quad PF,6+ Mos 05/28/2013, 06/14/2016, 05/29/2017, 05/07/2018   Influenza-Unspecified 06/29/2008, 05/10/2012, 05/29/2012, 06/27/2013, 05/23/2014, 06/07/2016, 06/29/2017, 05/29/2018, 05/30/2019, 06/17/2020   MMR 10/30/2014   PFIZER(Purple Top)SARS-COV-2 Vaccination 10/03/2019, 10/24/2019, 06/01/2020   Pneumococcal Conjugate-13 11/10/2014, 12/08/2016, 03/05/2020   Pneumococcal Polysaccharide-23 10/29/2008, 05/28/2013, 03/07/2016   Tdap 10/24/2011   Zoster Recombinat (Shingrix) 08/15/2017, 01/26/2018   Zoster, Live 03/21/2011, 11/28/2018    TDAP status: Due, Education has been provided regarding the importance of this vaccine. Advised may receive this vaccine at local pharmacy or Health Dept. Aware to provide a copy of the vaccination record if obtained from local pharmacy or Health Dept. Verbalized acceptance and understanding.  Flu Vaccine status: Up to date  Pneumococcal vaccine status: Up to  date  Covid-19 vaccine status: Information provided on how to obtain vaccines.   Qualifies for Shingles Vaccine? Yes   Zostavax completed No   Shingrix Completed?: Yes  Screening Tests Health Maintenance  Topic Date Due   Diabetic kidney evaluation - Urine ACR  Never done   FOOT EXAM  05/05/2021   COVID-19 Vaccine (7 - 2023-24 season) 08/09/2022   Medicare Annual Wellness (AWV)  09/02/2022   OPHTHALMOLOGY EXAM  09/10/2022   Hepatitis C Screening  08/03/2023 (Originally 01/05/1964)   INFLUENZA VACCINE  03/30/2023   HEMOGLOBIN A1C  06/06/2023   Diabetic kidney evaluation - eGFR measurement  12/05/2023   Fecal DNA (Cologuard)  06/08/2024   Pneumonia Vaccine 63+ Years old  Completed   Zoster Vaccines- Shingrix  Completed   HPV VACCINES  Aged Out   DTaP/Tdap/Td  Discontinued   COLONOSCOPY (Pts 45-75yrs Insurance coverage will need to be confirmed)  Discontinued    Health Maintenance  Health Maintenance Due  Topic Date Due   Diabetic kidney evaluation - Urine ACR  Never done   FOOT EXAM  05/05/2021   COVID-19 Vaccine (7 - 2023-24 season) 08/09/2022   Medicare Annual Wellness (AWV)  09/02/2022   OPHTHALMOLOGY EXAM  09/10/2022    Colorectal cancer screening: No longer required.   Lung Cancer Screening: (Low Dose CT Chest recommended if Age 10-80 years, 30 pack-year currently smoking OR have quit w/in 15years.) does not qualify.   Lung Cancer Screening Referral: n/a  Additional Screening:  Hepatitis C Screening: does qualify;  Vision Screening: Recommended annual ophthalmology exams for early detection of glaucoma and other disorders of the eye. Is the patient up to date with their annual eye exam?  {YES/NO:21197} Who is the provider or what is the name of the office in which the patient attends annual eye exams? *** If pt is not established with a provider, would they like to be referred to a provider to establish care? {YES/NO:21197}.   Dental Screening: Recommended  annual dental exams for proper oral hygiene  Community Resource Referral / Chronic Care Management: CRR required this visit?  {YES/NO:21197}  CCM required this visit?  {YES/NO:21197}     Plan:     I have personally reviewed and noted the following in the patient's chart:   Medical and social history Use of alcohol, tobacco or illicit drugs  Current medications and supplements including opioid prescriptions. {Opioid Prescriptions:[id number]} Functional ability and status Nutritional status Physical activity Advanced directives List of other physicians Hospitalizations, surgeries, and ER visits in previous 12 months Vitals Screenings to include cognitive, depression, and falls Referrals and appointments  In addition, I have reviewed and discussed with patient certain preventive protocols, quality metrics, and best practice recommendations.  A written personalized care plan for preventive services as well as general preventive health recommendations were provided to patient.     Durwin Nora, [place]   9/62/9528   Due to this being a virtual visit, the after visit summary with patients personalized plan was offered to patient via mail or my-chart. ***Patient declined at this time./ Patient would like to access on my-chart/ per request, patient was mailed a copy of AVS./ Patient preferred to pick up at office at next visit   Nurse Notes: ***

## 2022-12-21 NOTE — Patient Instructions (Incomplete)
Cameron Daniel , Thank you for taking time to come for your Medicare Wellness Visit. I appreciate your ongoing commitment to your health goals. Please review the following plan we discussed and let me know if I can assist you in the future.   These are the goals we discussed:  Goals      Exercise 3x per week (30 min per time)     Increase exercise as tolerated. Get boat fixed.         This is a list of the screening recommended for you and due dates:  Health Maintenance  Topic Date Due   Yearly kidney health urinalysis for diabetes  Never done   Complete foot exam   05/05/2021   COVID-19 Vaccine (7 - 2023-24 season) 08/09/2022   Medicare Annual Wellness Visit  09/02/2022   Eye exam for diabetics  09/10/2022   Hepatitis C Screening: USPSTF Recommendation to screen - Ages 18-79 yo.  08/03/2023*   Flu Shot  03/30/2023   Hemoglobin A1C  06/06/2023   Yearly kidney function blood test for diabetes  12/05/2023   Cologuard (Stool DNA test)  06/08/2024   Pneumonia Vaccine  Completed   Zoster (Shingles) Vaccine  Completed   HPV Vaccine  Aged Out   DTaP/Tdap/Td vaccine  Discontinued   Colon Cancer Screening  Discontinued  *Topic was postponed. The date shown is not the original due date.    Advanced directives: ***  Conditions/risks identified: Aim for 30 minutes of exercise or brisk walking, 6-8 glasses of water, and 5 servings of fruits and vegetables each day.   Next appointment: Follow up in one year for your annual wellness visit.   Preventive Care 12 Years and Older, Male  Preventive care refers to lifestyle choices and visits with your health care provider that can promote health and wellness. What does preventive care include? A yearly physical exam. This is also called an annual well check. Dental exams once or twice a year. Routine eye exams. Ask your health care provider how often you should have your eyes checked. Personal lifestyle choices, including: Daily care of  your teeth and gums. Regular physical activity. Eating a healthy diet. Avoiding tobacco and drug use. Limiting alcohol use. Practicing safe sex. Taking low doses of aspirin every day. Taking vitamin and mineral supplements as recommended by your health care provider. What happens during an annual well check? The services and screenings done by your health care provider during your annual well check will depend on your age, overall health, lifestyle risk factors, and family history of disease. Counseling  Your health care provider may ask you questions about your: Alcohol use. Tobacco use. Drug use. Emotional well-being. Home and relationship well-being. Sexual activity. Eating habits. History of falls. Memory and ability to understand (cognition). Work and work Astronomer. Screening  You may have the following tests or measurements: Height, weight, and BMI. Blood pressure. Lipid and cholesterol levels. These may be checked every 5 years, or more frequently if you are over 48 years old. Skin check. Lung cancer screening. You may have this screening every year starting at age 71 if you have a 30-pack-year history of smoking and currently smoke or have quit within the past 15 years. Fecal occult blood test (FOBT) of the stool. You may have this test every year starting at age 28. Flexible sigmoidoscopy or colonoscopy. You may have a sigmoidoscopy every 5 years or a colonoscopy every 10 years starting at age 20. Prostate cancer screening. Recommendations will  vary depending on your family history and other risks. Hepatitis C blood test. Hepatitis B blood test. Sexually transmitted disease (STD) testing. Diabetes screening. This is done by checking your blood sugar (glucose) after you have not eaten for a while (fasting). You may have this done every 1-3 years. Abdominal aortic aneurysm (AAA) screening. You may need this if you are a current or former smoker. Osteoporosis. You may be  screened starting at age 87 if you are at high risk. Talk with your health care provider about your test results, treatment options, and if necessary, the need for more tests. Vaccines  Your health care provider may recommend certain vaccines, such as: Influenza vaccine. This is recommended every year. Tetanus, diphtheria, and acellular pertussis (Tdap, Td) vaccine. You may need a Td booster every 10 years. Zoster vaccine. You may need this after age 38. Pneumococcal 13-valent conjugate (PCV13) vaccine. One dose is recommended after age 81. Pneumococcal polysaccharide (PPSV23) vaccine. One dose is recommended after age 41. Talk to your health care provider about which screenings and vaccines you need and how often you need them. This information is not intended to replace advice given to you by your health care provider. Make sure you discuss any questions you have with your health care provider. Document Released: 09/11/2015 Document Revised: 05/04/2016 Document Reviewed: 06/16/2015 Elsevier Interactive Patient Education  2017 [place] Prevention in the Home Falls can cause injuries. They can happen to people of all ages. There are many things you can do to make your home safe and to help prevent falls. What can I do on the outside of my home? Regularly fix the edges of walkways and driveways and fix any cracks. Remove anything that might make you trip as you walk through a door, such as a raised step or threshold. Trim any bushes or trees on the path to your home. Use bright outdoor lighting. Clear any walking paths of anything that might make someone trip, such as rocks or tools. Regularly check to see if handrails are loose or broken. Make sure that both sides of any steps have handrails. Any raised decks and porches should have guardrails on the edges. Have any leaves, snow, or ice cleared regularly. Use sand or salt on walking paths during winter. Clean up any spills in  your garage right away. This includes oil or grease spills. What can I do in the bathroom? Use night lights. Install grab bars by the toilet and in the tub and shower. Do not use towel bars as grab bars. Use non-skid mats or decals in the tub or shower. If you need to sit down in the shower, use a plastic, non-slip stool. Keep the floor dry. Clean up any water that spills on the floor as soon as it happens. Remove soap buildup in the tub or shower regularly. Attach bath mats securely with double-sided non-slip rug tape. Do not have throw rugs and other things on the floor that can make you trip. What can I do in the bedroom? Use night lights. Make sure that you have a light by your bed that is easy to reach. Do not use any sheets or blankets that are too big for your bed. They should not hang down onto the floor. Have a firm chair that has side arms. You can use this for support while you get dressed. Do not have throw rugs and other things on the floor that can make you trip. What can I do in  the kitchen? Clean up any spills right away. Avoid walking on wet floors. Keep items that you use a lot in easy-to-reach places. If you need to reach something above you, use a strong step stool that has a grab bar. Keep electrical cords out of the way. Do not use floor polish or wax that makes floors slippery. If you must use wax, use non-skid floor wax. Do not have throw rugs and other things on the floor that can make you trip. What can I do with my stairs? Do not leave any items on the stairs. Make sure that there are handrails on both sides of the stairs and use them. Fix handrails that are broken or loose. Make sure that handrails are as long as the stairways. Check any carpeting to make sure that it is firmly attached to the stairs. Fix any carpet that is loose or worn. Avoid having throw rugs at the top or bottom of the stairs. If you do have throw rugs, attach them to the floor with carpet  tape. Make sure that you have a light switch at the top of the stairs and the bottom of the stairs. If you do not have them, ask someone to add them for you. What else can I do to help prevent falls? Wear shoes that: Do not have high heels. Have rubber bottoms. Are comfortable and fit you well. Are closed at the toe. Do not wear sandals. If you use a stepladder: Make sure that it is fully opened. Do not climb a closed stepladder. Make sure that both sides of the stepladder are locked into place. Ask someone to hold it for you, if possible. Clearly mark and make sure that you can see: Any grab bars or handrails. First and last steps. Where the edge of each step is. Use tools that help you move around (mobility aids) if they are needed. These include: Canes. Walkers. Scooters. Crutches. Turn on the lights when you go into a dark area. Replace any light bulbs as soon as they burn out. Set up your furniture so you have a clear path. Avoid moving your furniture around. If any of your floors are uneven, fix them. If there are any pets around you, be aware of where they are. Review your medicines with your doctor. Some medicines can make you feel dizzy. This can increase your chance of falling. Ask your doctor what other things that you can do to help prevent falls. This information is not intended to replace advice given to you by your health care provider. Make sure you discuss any questions you have with your health care provider. Document Released: 06/11/2009 Document Revised: 01/21/2016 Document Reviewed: 09/19/2014 Elsevier Interactive Patient Education  2017 [employer].

## 2022-12-22 ENCOUNTER — Ambulatory Visit (INDEPENDENT_AMBULATORY_CARE_PROVIDER_SITE_OTHER): Payer: Medicare HMO

## 2022-12-22 VITALS — Ht 71.0 in | Wt 191.0 lb

## 2022-12-22 DIAGNOSIS — I472 Ventricular tachycardia, unspecified: Secondary | ICD-10-CM | POA: Diagnosis not present

## 2022-12-22 DIAGNOSIS — Z0000 Encounter for general adult medical examination without abnormal findings: Secondary | ICD-10-CM

## 2022-12-22 LAB — CUP PACEART REMOTE DEVICE CHECK
Battery Remaining Longevity: 121 mo
Battery Voltage: 3.01 V
Brady Statistic RV Percent Paced: 0.01 %
Date Time Interrogation Session: 20240425063627
HighPow Impedance: 72 Ohm
Implantable Lead Connection Status: 753985
Implantable Lead Implant Date: 20211028
Implantable Lead Location: 753860
Implantable Pulse Generator Implant Date: 20211028
Lead Channel Impedance Value: 380 Ohm
Lead Channel Impedance Value: 437 Ohm
Lead Channel Pacing Threshold Amplitude: 0.625 V
Lead Channel Pacing Threshold Pulse Width: 0.4 ms
Lead Channel Sensing Intrinsic Amplitude: 22.5 mV
Lead Channel Sensing Intrinsic Amplitude: 22.5 mV
Lead Channel Setting Pacing Amplitude: 2 V
Lead Channel Setting Pacing Pulse Width: 0.4 ms
Lead Channel Setting Sensing Sensitivity: 0.3 mV
Zone Setting Status: 755011
Zone Setting Status: 755011

## 2023-01-02 ENCOUNTER — Encounter: Payer: Self-pay | Admitting: Family Medicine

## 2023-01-02 ENCOUNTER — Ambulatory Visit (INDEPENDENT_AMBULATORY_CARE_PROVIDER_SITE_OTHER): Payer: Medicare HMO | Admitting: Family Medicine

## 2023-01-02 VITALS — BP 132/90 | HR 81 | Temp 97.7°F | Ht 71.0 in | Wt 193.2 lb

## 2023-01-02 DIAGNOSIS — D200 Benign neoplasm of soft tissue of retroperitoneum: Secondary | ICD-10-CM | POA: Insufficient documentation

## 2023-01-02 DIAGNOSIS — Z659 Problem related to unspecified psychosocial circumstances: Secondary | ICD-10-CM | POA: Insufficient documentation

## 2023-01-02 DIAGNOSIS — C44310 Basal cell carcinoma of skin of unspecified parts of face: Secondary | ICD-10-CM | POA: Insufficient documentation

## 2023-01-02 DIAGNOSIS — R1084 Generalized abdominal pain: Secondary | ICD-10-CM

## 2023-01-02 DIAGNOSIS — R350 Frequency of micturition: Secondary | ICD-10-CM

## 2023-01-02 DIAGNOSIS — R935 Abnormal findings on diagnostic imaging of other abdominal regions, including retroperitoneum: Secondary | ICD-10-CM | POA: Insufficient documentation

## 2023-01-02 LAB — URINALYSIS, ROUTINE W REFLEX MICROSCOPIC
Bacteria, UA: NONE SEEN /HPF
Hyaline Cast: NONE SEEN /LPF
Nitrite: POSITIVE — AB
Specific Gravity, Urine: 1.035 (ref 1.001–1.035)
Squamous Epithelial / HPF: NONE SEEN /HPF (ref <=5)
pH: 8.5 — ABNORMAL HIGH (ref 5.0–8.0)

## 2023-01-02 LAB — MICROSCOPIC MESSAGE

## 2023-01-02 MED ORDER — CIPROFLOXACIN HCL 500 MG PO TABS: 500.0000 mg | ORAL_TABLET | Freq: Two times a day (BID) | ORAL | 0 refills | Status: AC

## 2023-01-02 NOTE — Addendum Note (Signed)
Addended by: Venia Carbon K on: 01/02/2023 09:11 AM   Modules accepted: Orders

## 2023-01-02 NOTE — Progress Notes (Signed)
Subjective:    Patient ID: Cameron Daniel, male    DOB: September 02, 1945, 77 y.o.   MRN: [id number]  Felt terrible yesterday.  Bilateral low back pain.  Nausea.  This morning having increased urinary frequency, urgency, and gross hematuria.  Urine is blood red making urinalysis unreliable.  Does report dysuria at head of penis.  Patient is having bladder spasms causing urinary urgency and frequency.  He has small black grain sized clots of blood in the toilet when he urinates.  He showed this to me today.  Urinalysis shows +3 blood positive nitrates +3 leukocyte Estrace however this is extremely unreliable Past Medical History:  Diagnosis Date   Allergic rhinitis, cause unspecified    Allergy    Anxiety    Arthritis    Cancer (HCC)    a couple places removed from eyelid   Carotid Doppler 04/2020   Carotid US 9/21: No evidence of ICA stenosis bilaterally; right vertebral artery stenosis   Cataract    Chronic combined systolic (congestive) and diastolic (congestive) heart failure (HCC)    Coronary atherosclerosis of unspecified type of vessel, native or graft    a. MI 1985 with unclear details. b. CABG 03/2019 with LIMA -> LAD, SVG -> OM, SVG to dRCA.   Depression    PTSD    Diverticulosis of colon (without mention of hemorrhage)    Esophageal reflux    Esophageal stricture    Essential hypertension    Former tobacco use    Hemorrhoids    Hyperlipidemia    ICD (implantable cardioverter-defibrillator) in place    Irritable bowel syndrome    Ischemic cardiomyopathy    Mild carotid artery disease (HCC)    a. 1-39% bilaterally 03/2019 duplex.   Myocardial infarction (HCC)    Nocturia    Obesity, unspecified    Orthostasis    Other acquired absence of organ    Personal history of colonic polyps    Psychosexual dysfunction with inhibited sexual excitement    Retroperitoneal bleed    Type II or unspecified type diabetes mellitus without mention of complication, not stated as  uncontrolled    VT (ventricular tachycardia) (HCC)    Past Surgical History:  Procedure Laterality Date   ADENOIDECTOMY     CARPAL TUNNEL RELEASE     left    CHOLECYSTECTOMY     COLON RESECTION     18 inches   COLON SURGERY  2013   CORONARY ANGIOPLASTY     1985    CORONARY ARTERY BYPASS GRAFT N/A 04/26/2019   Procedure: CORONARY ARTERY BYPASS GRAFTING (CABG) x Three, using left internal mammary artery and right leg greater saphenous vein harvested endoscopically;  Surgeon: Alleen Borne, MD;  Location: [facility] OR;  Service: Open Heart Surgery;  Laterality: N/A;   ELBOW BURSA SURGERY     EYE SURGERY     ICD IMPLANT N/A 06/25/2020   Procedure: ICD IMPLANT;  Surgeon: Lanier Prude, MD;  Location: [facility] INVASIVE CV LAB;  Service: Cardiovascular;  Laterality: N/A;   LEFT HEART CATH AND CORS/GRAFTS ANGIOGRAPHY N/A 06/19/2020   Procedure: LEFT HEART CATH AND CORS/GRAFTS ANGIOGRAPHY;  Surgeon: Lennette Bihari, MD;  Location: [facility] INVASIVE CV LAB;  Service: Cardiovascular;  Laterality: N/A;   POLYPECTOMY     RIGHT/LEFT HEART CATH AND CORONARY ANGIOGRAPHY N/A 04/16/2019   Procedure: RIGHT/LEFT HEART CATH AND CORONARY ANGIOGRAPHY;  Surgeon: Lyn Records, MD;  Location: [facility] INVASIVE CV LAB;  Service: Cardiovascular;  Laterality: N/A;  SHOULDER SURGERY     Rt. Shoulder   TEE WITHOUT CARDIOVERSION N/A 04/26/2019   Procedure: TRANSESOPHAGEAL ECHOCARDIOGRAM (TEE);  Surgeon: Alleen Borne, MD;  Location: [facility] OR;  Service: Open Heart Surgery;  Laterality: N/A;   TONSILLECTOMY     V TACH ABLATION N/A 06/22/2020   Procedure: V TACH ABLATION;  Surgeon: Lanier Prude, MD;  Location: [facility] INVASIVE CV LAB;  Service: Cardiovascular;  Laterality: N/A;   Current Outpatient Medications on File Prior to Visit  Medication Sig Dispense Refill   acetaminophen (TYLENOL) 500 MG tablet Take 1,000 mg by mouth at bedtime as needed for mild pain.     aspirin EC 81 MG tablet Take 1 tablet (81 mg total) by mouth daily.  90 tablet 3   azelastine (ASTELIN) 0.1 % nasal spray Place 2 sprays into both nostrils 2 (two) times daily. Use in each nostril as directed 30 mL 12   carvedilol (COREG) 12.5 MG tablet Take one tablet in the AM and Take two tablets in the PM     Cetirizine HCl (ZERVIATE) 0.24 % SOLN Apply 1 drop to eye 2 (two) times daily. 30 each 3   citalopram (CELEXA) 10 MG tablet TAKE 1 TABLET BY MOUTH EVERY DAY 90 tablet 1   colestipol (COLESTID) 5 g packet Take 5 g by mouth daily. 30 each 11   diphenoxylate-atropine (LOMOTIL) 2.5-0.025 MG tablet Take 2 tablets by mouth 4 (four) times daily as needed for diarrhea or loose stools. 30 tablet 0   empagliflozin (JARDIANCE) 25 MG TABS tablet TAKE 25 MG (1 TABLET) BY MOUTH DAILY BEFORE BREAKFAST. STOP PIOGLITAZONE 90 tablet 1   famotidine (PEPCID) 20 MG tablet TAKE 1 TABLET BY MOUTH EVERYDAY AT BEDTIME 90 tablet 1   fexofenadine (ALLEGRA) 180 MG tablet Take 1 tablet (180 mg total) by mouth daily as needed. For allergies 90 tablet 0   finasteride (PROSCAR) 5 MG tablet TAKE 1 TABLET BY MOUTH EVERY DAY 90 tablet 3   hydrocortisone (ANUSOL-HC) 2.5 % rectal cream Place 1 Application rectally at bedtime. 30 g 1   magnesium oxide (MAG-OX) 400 MG tablet Take 1 tablet (400 mg total) by mouth daily. 90 tablet 3   metroNIDAZOLE (FLAGYL) 250 MG tablet Take 1 tablet (250 mg total) by mouth 3 (three) times daily. 30 tablet 1   montelukast (SINGULAIR) 10 MG tablet TAKE 1 TABLET BY MOUTH EVERYDAY AT BEDTIME 90 tablet 3   nystatin (MYCOSTATIN) 100000 UNIT/ML suspension Take 5 mLs (500,000 Units total) by mouth 4 (four) times daily. 60 mL 0   nystatin cream (MYCOSTATIN) Apply 1 Application topically 3 (three) times daily. 30 g 1   pantoprazole (PROTONIX) 40 MG tablet Take 1 tablet (40 mg total) by mouth 2 (two) times daily. 60 tablet 11   rosuvastatin (CRESTOR) 40 MG tablet TAKE 1 TABLET BY MOUTH DAILY AT 6 PM. 90 tablet 3   sitaGLIPtin (JANUVIA) 50 MG tablet Take 1 tablet (50 mg  total) by mouth daily. 90 tablet 1   vitamin B-12 (CYANOCOBALAMIN) 100 MCG tablet Take 100 mcg by mouth daily.     No current facility-administered medications on file prior to visit.   Allergies  Allergen Reactions   Doxazosin Mesylate Other (See Comments)    Drops in blood pressure    Losartan     Other reaction(s): Muscle pain   Testosterone     Other reaction(s): Erythrocytosis Other reaction(s): Erythrocytosis   Flomax [Tamsulosin Hcl] Itching and Other (See Comments)  Drops in blood pressure     Penicillins Rash and Other (See Comments)    Has patient had a PCN reaction causing immediate rash, facial/tongue/throat swelling, SOB or lightheadedness with hypotension: No Has patient had a PCN reaction causing severe rash involving mucus membranes or skin necrosis: No Has patient had a PCN reaction that required hospitalization No Has patient had a PCN reaction occurring within the last 10 years: No If all of the above answers are "NO", then may proceed with Cephalosporin use.    Sulfonamide Derivatives Rash   Trulicity [Dulaglutide] Nausea And Vomiting    N&V, Dizziness   Social History   Socioeconomic History   Marital status: Married    Spouse name: Not on file   Number of children: 1   Years of education: 12   Highest education level: High school graduate  Occupational History   Occupation: [employer]- retired  Tobacco Use   Smoking status: Former    Types: Cigarettes    Quit date: 08/30/1983    Years since quitting: 39.3   Smokeless tobacco: Former   Tobacco comments:    Quit 30 years ago.  Vaping Use   Vaping Use: Never used  Substance and Sexual Activity   Alcohol use: Yes    Comment: rare   Drug use: No   Sexual activity: Yes  Other Topics Concern   Not on file  Social History Narrative   Not on file   Social Determinants of Health   Financial Resource Strain: Low Risk  (12/22/2022)   Overall Financial Resource Strain (CARDIA)    Difficulty of Paying  Living Expenses: Not hard at all  Food Insecurity: No Food Insecurity (12/22/2022)   Hunger Vital Sign    Worried About Running Out of Food in the Last Year: Never true    Ran Out of Food in the Last Year: Never true  Transportation Needs: No Transportation Needs (12/22/2022)   PRAPARE - Administrator, Civil Service (Medical): No    Lack of Transportation (Non-Medical): No  Physical Activity: Insufficiently Active (12/22/2022)   Exercise Vital Sign    Days of Exercise per Week: 3 days    Minutes of Exercise per Session: 30 min  Stress: No Stress Concern Present (12/22/2022)   [employer] of Occupational Health - Occupational Stress Questionnaire    Feeling of Stress : Not at all  Social Connections: Moderately Isolated (12/22/2022)   Social Connection and Isolation Panel [NHANES]    Frequency of Communication with Friends and Family: More than three times a week    Frequency of Social Gatherings with Friends and Family: More than three times a week    Attends Religious Services: Never    Database administrator or Organizations: No    Attends Banker Meetings: Never    Marital Status: Married  Catering manager Violence: Not At Risk (12/22/2022)   Humiliation, Afraid, Rape, and Kick questionnaire    Fear of Current or Ex-Partner: No    Emotionally Abused: No    Physically Abused: No    Sexually Abused: No     Review of Systems     Objective:   Physical Exam Vitals reviewed.  Constitutional:      General: He is not in acute distress.    Appearance: Normal appearance. He is obese. He is not ill-appearing or toxic-appearing.  Eyes:     General: Lids are normal.     Extraocular Movements:     Right  eye: Normal extraocular motion.     Left eye: Normal extraocular motion.     Conjunctiva/sclera:     Left eye: Left conjunctiva is not injected.  Cardiovascular:     Rate and Rhythm: Normal rate and regular rhythm.     Pulses: Normal pulses.     Heart  sounds: Normal heart sounds. No murmur heard.    No friction rub. No gallop.  Pulmonary:     Effort: Pulmonary effort is normal. No respiratory distress.     Breath sounds: Normal breath sounds. No stridor. No wheezing, rhonchi or rales.  Abdominal:     General: Abdomen is flat. Bowel sounds are normal. There is no distension.     Palpations: Abdomen is soft. There is no mass.     Tenderness: There is no abdominal tenderness. There is no right CVA tenderness, left CVA tenderness, guarding or rebound.     Hernia: No hernia is present.  Genitourinary:    Penis: Normal.      Testes: Normal.  Musculoskeletal:     Right lower leg: No edema.     Left lower leg: No edema.     Right foot: Normal capillary refill. No crepitus.     Left foot: Normal capillary refill. No crepitus.  Skin:    Findings: No rash.  Neurological:     General: No focal deficit present.     Mental Status: He is alert and oriented to person, place, and time. Mental status is at baseline.           Assessment & Plan:  Urinary frequency - Plan: Urinalysis, Routine w reflex microscopic  Generalized abdominal pain - Plan: CT RENAL STONE STUDY Suspect the patient is either passing a kidney stone or having cystitis.  Will cover cystitis with Cipro 500 twice daily for 7 days.  Obtain CT renal stone study to evaluate for nephrolithiasis.  Push fluids.  Await the results of the CT scan and urine culture

## 2023-01-03 ENCOUNTER — Ambulatory Visit
Admission: RE | Admit: 2023-01-03 | Discharge: 2023-01-03 | Disposition: A | Discharge status: Home / Self Care | Payer: Medicare HMO | Source: Ambulatory Visit | Attending: Family Medicine | Admitting: Family Medicine

## 2023-01-03 DIAGNOSIS — R1084 Generalized abdominal pain: Secondary | ICD-10-CM

## 2023-01-03 DIAGNOSIS — E119 Type 2 diabetes mellitus without complications: Secondary | ICD-10-CM | POA: Diagnosis not present

## 2023-01-04 LAB — URINE CULTURE
MICRO NUMBER:: 14917194
SPECIMEN QUALITY:: ADEQUATE

## 2023-01-12 DIAGNOSIS — M1711 Unilateral primary osteoarthritis, right knee: Secondary | ICD-10-CM | POA: Diagnosis not present

## 2023-01-16 NOTE — Progress Notes (Unsigned)
Cardiology Office Note:    Date:  01/18/2023   ID:  Cameron Daniel, Cameron Daniel [birth year], MRN [id number]  PCP:  Donita Brooks, MD   [facility] Providers Cardiologist:  Meriam Sprague, MD Electrophysiologist:  Lanier Prude, MD  Advanced Heart Failure:  Arvilla Meres, MD  {  Referring MD: Donita Brooks, MD    History of Present Illness:    Cameron Daniel is a 77 y.o. male with a hx of CAD s/p CABG with LIMA-LAD, SVG-OM, SVG-dRCA, systolic HF with LVEF 25-30%, VT s/p ablation 05/2020 with ICD implantation, HTN, and HLD who was previously followed by Dr. Delton See who now returns to clinic for follow-up.  Per review of the record, the patient had MI in 1985 with unclear intervention, treated by Dr. Riley Kill at that time. Aortoiliac duplex 2014 showed no AAA. He did well for many years before presenting back to the ED August 2020 with fever and malaise. He was seen in the ER and given fluids and discharged but returned back with respiratory failure requiring intubation for PNA and CHF and NSTEMI. 2D echo 04/13/19 showed EF 25-30%, severe hypokinesis of the left ventricular, entire inferior wall and anteroseptal wall, mild LAE, small pericardial effusion. Covid testing was negative. Cath showed 3V CAD with low output. He was started on milrinone post-cath. He developed a large spontaneous RP bleed post cath (cath all done from arm). He ultimately underwent CABG on 04/26/19. Pre-op TEE EF 45-50%. Post CABG course was fortunately uneventful. Spironolactone was later stopped due to hyperkalemia, olmestartan stopped due to hypotension, and he followed with CHF team for a period of time before graduating from their services. He has since tolerated losartan OK. In 05/2020 he was admitted with persistent VT. He was initially treated with amiodarone but ultimately cardioverted in ED. LHC showed patent grafts without specific culprit. He was seen by EP and underwent EPS/ablation with scar  modification and ICD implantation. 2D Echo 05/2020 showed EF 45-50%, mild LVH, grade 2 DD, moderate LAE, normal RV.  Seen 06/2022 where he was feeling more fatigued which he attributed to his coreg. We decreased his dosing to see if this would help with symptoms.  TTE 07/2022 with LVEF 40-45%, global hypokinesis, G3DD, severe RV dysfunction with moderate RV enlargement, mild MR.  Was last seen in clinic on 08/2022 where he had lost weight and was struggling with GI issues and was planned to see GI. Otherwise was stable from a CV standpoint. Upper endoscopy revealed esophageal stricture which was symptomatic and dilated. Also benign fundic gland polyps. He was continued on PPI. CT abdomen pelvis with no acute pathology.  Today, the patient feels okay. States he is feeling tired, lightheadedness, has muscle aches and is struggling with depression. Has lost weight with loss of appetite which had an unrevealing work-up as detailed above. He is wondering if his coreg is contributing to his symptoms. No chest pain, SOB, orthopnea, PND or LE edema. Has bilateral leg pain and back pain with walking. He is wanting to trial on a lower dose of coreg to see if it helps.  Notably, he did trial off crestor and his muscle aches did not improve.  Past Medical History:  Diagnosis Date   Allergic rhinitis, cause unspecified    Allergy    Anxiety    Arthritis    [facility])    a couple places removed from eyelid   Carotid Doppler 04/2020   Carotid US 9/21: No evidence of  ICA stenosis bilaterally; right vertebral artery stenosis   Cataract    Chronic combined systolic (congestive) and diastolic (congestive) heart failure (HCC)    Coronary atherosclerosis of unspecified type of vessel, native or graft    a. MI 1985 with unclear details. b. CABG 03/2019 with LIMA -> LAD, SVG -> OM, SVG to dRCA.   Depression    PTSD    Diverticulosis of colon (without mention of hemorrhage)    Esophageal reflux    Esophageal  stricture    Essential hypertension    Former tobacco use    Hemorrhoids    Hyperlipidemia    ICD (implantable cardioverter-defibrillator) in place    Irritable bowel syndrome    Ischemic cardiomyopathy    Mild carotid artery disease (HCC)    a. 1-39% bilaterally 03/2019 duplex.   Myocardial infarction (HCC)    Nocturia    Obesity, unspecified    Orthostasis    Other acquired absence of organ    Personal history of colonic polyps    Psychosexual dysfunction with inhibited sexual excitement    Retroperitoneal bleed    Type II or unspecified type diabetes mellitus without mention of complication, not stated as uncontrolled    VT (ventricular tachycardia) (HCC)     Past Surgical History:  Procedure Laterality Date   ADENOIDECTOMY     CARPAL TUNNEL RELEASE     left    CHOLECYSTECTOMY     COLON RESECTION     18 inches   COLON SURGERY  2013   CORONARY ANGIOPLASTY     1985    CORONARY ARTERY BYPASS GRAFT N/A 04/26/2019   Procedure: CORONARY ARTERY BYPASS GRAFTING (CABG) x Three, using left internal mammary artery and right leg greater saphenous vein harvested endoscopically;  Surgeon: Alleen Borne, MD;  Location: [facility] OR;  Service: Open Heart Surgery;  Laterality: N/A;   ELBOW BURSA SURGERY     EYE SURGERY     ICD IMPLANT N/A 06/25/2020   Procedure: ICD IMPLANT;  Surgeon: Lanier Prude, MD;  Location: [facility] INVASIVE CV LAB;  Service: Cardiovascular;  Laterality: N/A;   LEFT HEART CATH AND CORS/GRAFTS ANGIOGRAPHY N/A 06/19/2020   Procedure: LEFT HEART CATH AND CORS/GRAFTS ANGIOGRAPHY;  Surgeon: Lennette Bihari, MD;  Location: [facility] INVASIVE CV LAB;  Service: Cardiovascular;  Laterality: N/A;   POLYPECTOMY     RIGHT/LEFT HEART CATH AND CORONARY ANGIOGRAPHY N/A 04/16/2019   Procedure: RIGHT/LEFT HEART CATH AND CORONARY ANGIOGRAPHY;  Surgeon: Lyn Records, MD;  Location: [facility] INVASIVE CV LAB;  Service: Cardiovascular;  Laterality: N/A;   SHOULDER SURGERY     Rt. Shoulder   TEE WITHOUT  CARDIOVERSION N/A 04/26/2019   Procedure: TRANSESOPHAGEAL ECHOCARDIOGRAM (TEE);  Surgeon: Alleen Borne, MD;  Location: [facility] OR;  Service: Open Heart Surgery;  Laterality: N/A;   TONSILLECTOMY     V TACH ABLATION N/A 06/22/2020   Procedure: V TACH ABLATION;  Surgeon: Lanier Prude, MD;  Location: [facility] INVASIVE CV LAB;  Service: Cardiovascular;  Laterality: N/A;    Current Medications: Current Meds  Medication Sig   acetaminophen (TYLENOL) 500 MG tablet Take 1,000 mg by mouth at bedtime as needed for mild pain.   aspirin EC 81 MG tablet Take 1 tablet (81 mg total) by mouth daily.   azelastine (ASTELIN) 0.1 % nasal spray Place 2 sprays into both nostrils 2 (two) times daily. Use in each nostril as directed   carvedilol (COREG) 6.25 MG tablet Take 1 tablet (6.25 mg  total) by mouth 2 (two) times daily.   Cetirizine HCl (ZERVIATE) 0.24 % SOLN Apply 1 drop to eye 2 (two) times daily.   colestipol (COLESTID) 5 g packet Take 5 g by mouth daily.   diphenoxylate-atropine (LOMOTIL) 2.5-0.025 MG tablet Take 2 tablets by mouth 4 (four) times daily as needed for diarrhea or loose stools.   empagliflozin (JARDIANCE) 25 MG TABS tablet TAKE 25 MG (1 TABLET) BY MOUTH DAILY BEFORE BREAKFAST. STOP PIOGLITAZONE   famotidine (PEPCID) 20 MG tablet TAKE 1 TABLET BY MOUTH EVERYDAY AT BEDTIME   fexofenadine (ALLEGRA) 180 MG tablet Take 1 tablet (180 mg total) by mouth daily as needed. For allergies   finasteride (PROSCAR) 5 MG tablet TAKE 1 TABLET BY MOUTH EVERY DAY   hydrocortisone (ANUSOL-HC) 2.5 % rectal cream Place 1 Application rectally at bedtime.   magnesium oxide (MAG-OX) 400 MG tablet Take 1 tablet (400 mg total) by mouth daily.   montelukast (SINGULAIR) 10 MG tablet TAKE 1 TABLET BY MOUTH EVERYDAY AT BEDTIME   neomycin-polymyxin b-dexamethasone (MAXITROL) 3.5-10000-0.1 SUSP PLEASE SEE ATTACHED FOR DETAILED DIRECTIONS   nystatin (MYCOSTATIN) 100000 UNIT/ML suspension Take 5 mLs (500,000 Units total) by  mouth 4 (four) times daily.   nystatin cream (MYCOSTATIN) Apply 1 Application topically 3 (three) times daily.   pantoprazole (PROTONIX) 40 MG tablet Take 1 tablet (40 mg total) by mouth 2 (two) times daily.   rosuvastatin (CRESTOR) 40 MG tablet TAKE 1 TABLET BY MOUTH DAILY AT 6 PM.   sitaGLIPtin (JANUVIA) 50 MG tablet Take 1 tablet (50 mg total) by mouth daily.   vitamin B-12 (CYANOCOBALAMIN) 100 MCG tablet Take 100 mcg by mouth daily.   [DISCONTINUED] carvedilol (COREG) 12.5 MG tablet Take one tablet in the AM and Take two tablets in the PM   [DISCONTINUED] citalopram (CELEXA) 10 MG tablet TAKE 1 TABLET BY MOUTH EVERY DAY     Allergies:   Doxazosin mesylate, Losartan, Testosterone, Flomax [tamsulosin hcl], Penicillins, Sulfonamide derivatives, and Trulicity [dulaglutide]   Social History   Socioeconomic History   Marital status: Married    Spouse name: Not on file   Number of children: 1   Years of education: 12   Highest education level: High school graduate  Occupational History   Occupation: Therapist, sports- retired  Tobacco Use   Smoking status: Former    Types: Cigarettes    Quit date: 08/30/1983    Years since quitting: 39.4   Smokeless tobacco: Former   Tobacco comments:    Quit 30 years ago.  Vaping Use   Vaping Use: Never used  Substance and Sexual Activity   Alcohol use: Yes    Comment: rare   Drug use: No   Sexual activity: Yes  Other Topics Concern   Not on file  Social History Narrative   Not on file   Social Determinants of Health   Financial Resource Strain: Low Risk  (12/22/2022)   Overall Financial Resource Strain (CARDIA)    Difficulty of Paying Living Expenses: Not hard at all  Food Insecurity: No Food Insecurity (12/22/2022)   Hunger Vital Sign    Worried About Running Out of Food in the Last Year: Never true    Ran Out of Food in the Last Year: Never true  Transportation Needs: No Transportation Needs (12/22/2022)   PRAPARE - Scientist, research (physical sciences) (Medical): No    Lack of Transportation (Non-Medical): No  Physical Activity: Insufficiently Active (12/22/2022)   Exercise Vital Sign  Days of Exercise per Week: 3 days    Minutes of Exercise per Session: 30 min  Stress: No Stress Concern Present (12/22/2022)   [employer] of Occupational Health - Occupational Stress Questionnaire    Feeling of Stress : Not at all  Social Connections: Moderately Isolated (12/22/2022)   Social Connection and Isolation Panel [NHANES]    Frequency of Communication with Friends and Family: More than three times a week    Frequency of Social Gatherings with Friends and Family: More than three times a week    Attends Religious Services: Never    Database administrator or Organizations: No    Attends Engineer, structural: Never    Marital Status: Married     Family History: The patient's family history includes Allergic rhinitis in his father; Coronary artery disease in his mother; Lung cancer in his father. There is no history of Colon cancer, Heart attack, Hypertension, Stroke, Esophageal cancer, Rectal cancer, Stomach cancer, or Pancreatic cancer.  ROS:   Please see the history of present illness.       EKGs/Labs/Other Studies Reviewed:    The following studies were reviewed today:  TTE 07/2022: IMPRESSIONS   1. Left ventricular ejection fraction, by estimation, is 40 to 45%. The  left ventricle has mildly decreased function. The left ventricle  demonstrates global hypokinesis. There is mild concentric left ventricular  hypertrophy. Left ventricular diastolic  parameters are consistent with Grade III diastolic dysfunction  (restrictive). Elevated left ventricular end-diastolic pressure.   2. Right ventricular systolic function is severely reduced. The right  ventricular size is moderately enlarged. There is normal pulmonary artery  systolic pressure. The estimated right ventricular systolic pressure is  27.7 mmHg.    3. Left atrial size was moderately dilated.   4. The mitral valve is degenerative. Mild mitral valve regurgitation. No  evidence of mitral stenosis. Moderate mitral annular calcification.   5. The aortic valve is tricuspid. There is severe calcifcation of the  aortic valve. There is moderate thickening of the aortic valve. Aortic  valve regurgitation is not visualized. No aortic stenosis is present.   6. The inferior vena cava is normal in size with <50% respiratory  variability, suggesting right atrial pressure of 8 mmHg.   ICD Implant 06/25/2020:  CONCLUSIONS:  1. Ischemic cardiomyopathy with chronic New York Heart Association class II heart failure and sustained ventricular tachycardia. 2. Successful ICD implantation with a Medtronic ICD implanted for secondary prevention of sudden death.  3. No early apparent complications.   V Tach Ablation 06/22/2020:  CONCLUSIONS: 1. Sinus rhythm upon presentation  2. Clinical VT noninducible. 3. Nonclinical VT induced and was not hemodynamically tolerated (positive precordial concordance, inferior axis, CL ) 4. Successful scar modification along the mid inferior wall extending to the lateral wall 5. A large inferior and inferolateral LV scar on ICE and 3d electroanatomic mapping. 6. No early apparent complications. 7. ICD discussion with the patient and family   TTE 06/20/20: IMPRESSIONS   1. Inferior and inferoseptal hypokinesis. Left ventricular ejection  fraction, by estimation, is 45 to 50%. The left ventricle has mildly  decreased function. The left ventricle demonstrates regional wall motion  abnormalities (see scoring diagram/findings   for description). There is mild concentric left ventricular hypertrophy.  Left ventricular diastolic parameters are consistent with Grade II  diastolic dysfunction (pseudonormalization).   2. Right ventricular systolic function is normal. The right ventricular  size is normal.   3. Left atrial  size was  moderately dilated.   4. The mitral valve is normal in structure. Trivial mitral valve  regurgitation. No evidence of mitral stenosis. Moderate mitral annular  calcification.   5. The aortic valve is calcified. There is mild calcification of the  aortic valve. There is mild thickening of the aortic valve. Aortic valve  regurgitation is not visualized. No aortic stenosis is present.   6. The inferior vena cava is dilated in size with <50% respiratory  variability, suggesting right atrial pressure of 15 mmHg.   LHC 05/2020: Ost LAD lesion is 80% stenosed. Prox LAD to Mid LAD lesion is 90% stenosed. Mid LAD lesion is 95% stenosed. Mid RCA lesion is 85% stenosed. Mid LM to Dist LM lesion is 70% stenosed. Prox Cx to Mid Cx lesion is 85% stenosed. Mid Cx to Dist Cx lesion is 95% stenosed. Non-stenotic Prox RCA lesion. Prox Graft lesion is 20% stenosed.   Severe native multivessel CAD with 70% distal left main stenosis, 80% ostial LAD stenosis followed by segmental 90% stenoses before and after the first diagonal vessel with 95% stenosis in the midsegment proximal to a septal perforating artery with competitive filling seen distally; diffuse 80 to 95% circumflex stenoses; ectatic proximal RCA with 80% mid stenoses.   Patent LIMA graft supplying the mid LAD.   Patent SVG supplying the distal circumflex marginal vessel branch.   Patent SVG supplying the distal RCA with mild smooth 20% focal proximal to mid narrowing.   Fairly preserved global LV function with EF estimated 50 to 55%.  There is a suggestion of possible apical hypertrophy with nipple protrusion of the apex.  LVEDP .   RECOMMENDATION: The patient's bypass conduits are widely patent.  Further EP evaluation for possible defibrillator or VT ablation.  EKG: No new tracing today  Recent Labs: 04/01/2022: Magnesium 1.7 12/05/2022: ALT 32; BUN 13; Creat 0.80; Hemoglobin 15.1; Platelets 180; Potassium 4.4; Sodium 136; TSH  1.32   Recent Lipid Panel    Component Value Date/Time   CHOL 116 05/10/2022 1105   TRIG 233 (H) 05/10/2022 1105   TRIG 193 (H) 08/08/2006 0924   HDL 61 05/10/2022 1105   CHOLHDL 1.9 05/10/2022 1105   VLDL 40 (H) 12/15/2016 0815   LDLCALC 26 05/10/2022 1105   LDLDIRECT 49.9 08/26/2013 1011         Physical Exam:    VS:  BP 114/66   Pulse 74   Ht 5\' 11"  (1.803 m)   Wt 192 lb 9.6 oz (87.4 kg)   SpO2 97%   BMI 26.86 kg/m     Wt Readings from Last 3 Encounters:  01/18/23 192 lb 9.6 oz (87.4 kg)  01/02/23 193 lb 3.2 oz (87.6 kg)  12/22/22 191 lb (86.6 kg)     GEN:  Well nourished, well developed in no acute distress HEENT: Normal NECK: No JVD; No carotid bruits CARDIAC: RRR, 2/6 systolic murmur, No rubs, no gallops RESPIRATORY:  Clear to auscultation without rales, wheezing or rhonchi  ABDOMEN: Soft, non-tender, non-distended MUSCULOSKELETAL:  Warm , no edema SKIN: Warm and dry NEUROLOGIC:  Alert and oriented x 3 PSYCHIATRIC:  Normal affect   ASSESSMENT:    1. Coronary artery disease involving native coronary artery of native heart without angina pectoris   2. CAD in native artery   3. Chronic combined systolic and diastolic CHF (congestive heart failure) (HCC)   4. Ventricular tachycardia (HCC)   5. Mixed hyperlipidemia   6. Essential hypertension   7. Hyperlipidemia LDL goal <  70   8. Claudication of both lower extremities (HCC)   9. DOE (dyspnea on exertion)      PLAN:    In order of problems listed above:  #CAD s/p CABG: Patient is s/p CABG with LIMA-LAD, SVG-OM, SVG to dRCA in 03/2019. Most recent cath in 08/2019 with patent grafts. Currently with progressive fatigue. Will check NM PET for further evaluation. -Check NM PET -Continue ASA 81mg  daily -Continue crestor 40mg  daily -Continue losartan 25mg  daily -Decrease coreg to 6.25mg  BID and see if helps with fatigue  #Chronic Combined Systolic and Diastolic HF: Patient with known history of combined  systolic and diastolic HF. EF 2020 25-30% which improved to 45-50% in 2021 but dropped slightly to 40-45% in 07/2022. Currently, he is euvolemic and compensated on exam. GDMT limited due to hypotension.  -Decrease coreg to 6.25mg  BID and see if helps with fatigue -Continue jardiance 25mg  daily -Off losartan due to hypotension -Low Na diet; monitoring weights  #RV dysfunction: Appears to be mildly to moderately depressed on personal review of TTE. Will continue with medical therapy.  #Bilateral Leg Pain: -Complains of pain down both legs with ambulation that improves with rest -Will check ABI for further evaluation  #VT s/p ablation: Underwent VT ablation and ICD implantation with Dr. Lalla Brothers. -Follow-up with EP as scheduled -Decrease coreg to 6.25mg  BID and see if helps with fatigue  #HTN: Blood pressure mainly 110s at home -Decrease coreg to 6.25mg  BID and see if helps with fatigue -Off losartan due to hypotension  #Weight Loss: GI work-up revealed esophageal stricture which was dilated but no evidence of malignancy on EGD or CT a/p. Currently appetite improved. Follows with Dr. Marina Goodell.  #Depression: -Resume citalopram 10mg  daily -Follow-up with PCP       Medication Adjustments/Labs and Tests Ordered: Current medicines are reviewed at length with the patient today.  Concerns regarding medicines are outlined above.   Orders Placed This Encounter  Procedures   NM PET CT CARDIAC PERFUSION MULTI W/ABSOLUTE BLOODFLOW   VAS [place] LOWER EXTREMITY ARTERIAL DUPLEX   VAS [place] ABI WITH/WO TBI   Meds ordered this encounter  Medications   citalopram (CELEXA) 10 MG tablet    Sig: Take 1 tablet (10 mg total) by mouth daily.    Dispense:  90 tablet    Refill:  1   carvedilol (COREG) 6.25 MG tablet    Sig: Take 1 tablet (6.25 mg total) by mouth 2 (two) times daily.    Dispense:  180 tablet    Refill:  2    Dose decrease   Patient Instructions  Medication Instructions:   DECREASE  YOUR CARVEDILOL TO 6.25 MG BY MOUTH TWICE DAILY  *If you need a refill on your cardiac medications before your next appointment, please call your pharmacy*   Testing/Procedures:  Your physician has requested that you have a lower extremity arterial duplex. This test is an ultrasound of the arteries in the legs or arms. It looks at arterial blood flow in the legs and arms. Allow one hour for Lower and Upper Arterial scans. There are no restrictions or special instructions   Your physician has requested that you have an ankle brachial index (ABI). During this test an ultrasound and blood pressure cuff are used to evaluate the arteries that supply the arms and legs with blood. Allow thirty minutes for this exam. There are no restrictions or special instructions.   How to Prepare for Your Cardiac PET/CT Stress Test:  1. Please do  not take these medications before your test:   Medications that may interfere with the cardiac pharmacological stress agent (ex. nitrates - including erectile dysfunction medications, isosorbide mononitrate, tamulosin or beta-blockers) the day of the exam. (Erectile dysfunction medication should be held for at least 72 hrs prior to test)  DO NOT TAKE CARVEDILOL THE DAY OF THE EXAM  Your remaining medications may be taken with water.  2. Nothing to eat or drink, except water, 3 hours prior to arrival time.   NO caffeine/decaffeinated products, or chocolate 12 hours prior to arrival.  3. NO perfume, cologne or lotion  4. Total time is 1 to 2 hours; you may want to bring reading material for the waiting time.  5. Please report to Radiology at the [facility] Main Entrance 30 minutes early for your test.  [address] [place], [place] [address]  Diabetic Preparation:  Hold oral medications. You may take NPH and Lantus insulin. Do not take Humalog or Humulin R (Regular Insulin) the day of your test. Check blood sugars prior to leaving the house. If  able to eat breakfast prior to 3 hour fasting, you may take all medications, including your insulin, Do not worry if you miss your breakfast dose of insulin - start at your next meal.    In preparation for your appointment, medication and supplies will be purchased.  Appointment availability is limited, so if you need to cancel or reschedule, please call the Radiology Department at [phone]  24 hours in advance to avoid a cancellation fee of $100.00  What to Expect After you Arrive:  Once you arrive and check in for your appointment, you will be taken to a preparation room within the Radiology Department.  A technologist or Nurse will obtain your medical history, verify that you are correctly prepped for the exam, and explain the procedure.  Afterwards,  an IV will be started in your arm and electrodes will be placed on your skin for EKG monitoring during the stress portion of the exam. Then you will be escorted to the PET/CT scanner.  There, staff will get you positioned on the scanner and obtain a blood pressure and EKG.  During the exam, you will continue to be connected to the EKG and blood pressure machines.  A small, safe amount of a radioactive tracer will be injected in your IV to obtain a series of pictures of your heart along with an injection of a stress agent.    After your Exam:  It is recommended that you eat a meal and drink a caffeinated beverage to counter act any effects of the stress agent.  Drink plenty of fluids for the remainder of the day and urinate frequently for the first couple of hours after the exam.  Your doctor will inform you of your test results within 7-10 business days.  For questions about your test or how to prepare for your test, please call: Cameron Daniel, Cardiac Imaging Nurse Navigator  Cameron Daniel, Cardiac Imaging Nurse Navigator Office: [phone]    Follow-Up:  3 MONTHS WITH DR. Gerri Spore Daniel      Signed, Meriam Sprague, MD   01/18/2023 12:06 PM    [facility]

## 2023-01-17 NOTE — Progress Notes (Signed)
Remote ICD transmission.   

## 2023-01-18 ENCOUNTER — Encounter: Payer: Self-pay | Admitting: Cardiology

## 2023-01-18 ENCOUNTER — Ambulatory Visit: Payer: Medicare HMO | Attending: Cardiology | Admitting: Cardiology

## 2023-01-18 VITALS — BP 114/66 | HR 74 | Ht 71.0 in | Wt 192.6 lb

## 2023-01-18 DIAGNOSIS — I5042 Chronic combined systolic (congestive) and diastolic (congestive) heart failure: Secondary | ICD-10-CM

## 2023-01-18 DIAGNOSIS — I10 Essential (primary) hypertension: Secondary | ICD-10-CM

## 2023-01-18 DIAGNOSIS — I472 Ventricular tachycardia, unspecified: Secondary | ICD-10-CM | POA: Diagnosis not present

## 2023-01-18 DIAGNOSIS — E785 Hyperlipidemia, unspecified: Secondary | ICD-10-CM | POA: Diagnosis not present

## 2023-01-18 DIAGNOSIS — R0609 Other forms of dyspnea: Secondary | ICD-10-CM

## 2023-01-18 DIAGNOSIS — I2510 Atherosclerotic heart disease of native coronary artery without angina pectoris: Secondary | ICD-10-CM

## 2023-01-18 DIAGNOSIS — I739 Peripheral vascular disease, unspecified: Secondary | ICD-10-CM

## 2023-01-18 DIAGNOSIS — E782 Mixed hyperlipidemia: Secondary | ICD-10-CM | POA: Diagnosis not present

## 2023-01-18 MED ORDER — CARVEDILOL 6.25 MG PO TABS: 6.2500 mg | ORAL_TABLET | Freq: Two times a day (BID) | ORAL | 2 refills | Status: DC

## 2023-01-18 MED ORDER — CITALOPRAM HYDROBROMIDE 10 MG PO TABS: 10.0000 mg | ORAL_TABLET | Freq: Every day | ORAL | 1 refills | Status: DC

## 2023-01-18 NOTE — Patient Instructions (Signed)
Medication Instructions:   DECREASE YOUR CARVEDILOL TO 6.25 MG BY MOUTH TWICE DAILY  *If you need a refill on your cardiac medications before your next appointment, please call your pharmacy*   Testing/Procedures:  Your physician has requested that you have a lower extremity arterial duplex. This test is an ultrasound of the arteries in the legs or arms. It looks at arterial blood flow in the legs and arms. Allow one hour for Lower and Upper Arterial scans. There are no restrictions or special instructions   Your physician has requested that you have an ankle brachial index (ABI). During this test an ultrasound and blood pressure cuff are used to evaluate the arteries that supply the arms and legs with blood. Allow thirty minutes for this exam. There are no restrictions or special instructions.   How to Prepare for Your Cardiac PET/CT Stress Test:  1. Please do not take these medications before your test:   Medications that may interfere with the cardiac pharmacological stress agent (ex. nitrates - including erectile dysfunction medications, isosorbide mononitrate, tamulosin or beta-blockers) the day of the exam. (Erectile dysfunction medication should be held for at least 72 hrs prior to test)  DO NOT TAKE CARVEDILOL THE DAY OF THE EXAM  Your remaining medications may be taken with water.  2. Nothing to eat or drink, except water, 3 hours prior to arrival time.   NO caffeine/decaffeinated products, or chocolate 12 hours prior to arrival.  3. NO perfume, cologne or lotion  4. Total time is 1 to 2 hours; you may want to bring reading material for the waiting time.  5. Please report to Radiology at the [facility] Main Entrance 30 minutes early for your test.  [address] [place], [place] [address]  Diabetic Preparation:  Hold oral medications. You may take NPH and Lantus insulin. Do not take Humalog or Humulin R (Regular Insulin) the day of your test. Check blood  sugars prior to leaving the house. If able to eat breakfast prior to 3 hour fasting, you may take all medications, including your insulin, Do not worry if you miss your breakfast dose of insulin - start at your next meal.    In preparation for your appointment, medication and supplies will be purchased.  Appointment availability is limited, so if you need to cancel or reschedule, please call the Radiology Department at [phone]  24 hours in advance to avoid a cancellation fee of $100.00  What to Expect After you Arrive:  Once you arrive and check in for your appointment, you will be taken to a preparation room within the Radiology Department.  A technologist or Nurse will obtain your medical history, verify that you are correctly prepped for the exam, and explain the procedure.  Afterwards,  an IV will be started in your arm and electrodes will be placed on your skin for EKG monitoring during the stress portion of the exam. Then you will be escorted to the PET/CT scanner.  There, staff will get you positioned on the scanner and obtain a blood pressure and EKG.  During the exam, you will continue to be connected to the EKG and blood pressure machines.  A small, safe amount of a radioactive tracer will be injected in your IV to obtain a series of pictures of your heart along with an injection of a stress agent.    After your Exam:  It is recommended that you eat a meal and drink a caffeinated beverage to counter  act any effects of the stress agent.  Drink plenty of fluids for the remainder of the day and urinate frequently for the first couple of hours after the exam.  Your doctor will inform you of your test results within 7-10 business days.  For questions about your test or how to prepare for your test, please call: Rockwell Alexandria, Cardiac Imaging Nurse Navigator  Larey Brick, Cardiac Imaging Nurse Navigator Office: [phone]    Follow-Up:  3 MONTHS WITH DR. Gerri Spore O'NEAL

## 2023-02-03 ENCOUNTER — Ambulatory Visit (HOSPITAL_COMMUNITY)
Admission: RE | Admit: 2023-02-03 | Discharge: 2023-02-03 | Disposition: A | Discharge status: Home / Self Care | Payer: Medicare HMO | Source: Ambulatory Visit | Attending: Internal Medicine | Admitting: Internal Medicine

## 2023-02-03 DIAGNOSIS — I739 Peripheral vascular disease, unspecified: Secondary | ICD-10-CM | POA: Diagnosis not present

## 2023-02-03 DIAGNOSIS — I2510 Atherosclerotic heart disease of native coronary artery without angina pectoris: Secondary | ICD-10-CM

## 2023-02-03 LAB — VAS US ABI WITH/WO TBI

## 2023-02-06 ENCOUNTER — Telehealth: Payer: Self-pay

## 2023-02-06 NOTE — Telephone Encounter (Signed)
Pt called to speak with nurse about this med colestipol (COLESTID) 5 g packet [[id number]]. Pt states that he is having a hard time getting this med refilled. Pt wanted to know if there was another med that he could take in its place. Pt also wanted to ask pcp/nurse if he could begin to take Super Beta Prostate? Please advise  Cb#:  [phone] Pt stated it would be ok to lvm with this info.

## 2023-02-07 ENCOUNTER — Other Ambulatory Visit: Payer: Self-pay

## 2023-02-07 MED ORDER — CHOLESTYRAMINE 4 G PO PACK: 2.0000 g | PACK | Freq: Two times a day (BID) | ORAL | 3 refills | Status: DC

## 2023-02-16 ENCOUNTER — Other Ambulatory Visit: Payer: Self-pay | Admitting: Family Medicine

## 2023-02-16 MED ORDER — SITAGLIPTIN PHOSPHATE 50 MG PO TABS: 50.0000 mg | ORAL_TABLET | Freq: Every day | ORAL | 3 refills | Status: DC

## 2023-02-20 ENCOUNTER — Telehealth (HOSPITAL_COMMUNITY): Payer: Self-pay | Admitting: Emergency Medicine

## 2023-02-20 NOTE — Telephone Encounter (Signed)
Reaching out to patient to offer assistance regarding upcoming cardiac imaging study; pt verbalizes understanding of appt date/time, parking situation and where to check in, pre-test NPO status and medications ordered, and verified current allergies; name and call back number provided for further questions should they arise Noeli Lavery RN Navigator Cardiac Imaging [facility] [phone] office [phone] cell 

## 2023-02-21 ENCOUNTER — Encounter (HOSPITAL_COMMUNITY)
Admission: RE | Admit: 2023-02-21 | Discharge: 2023-02-21 | Disposition: A | Discharge status: Home / Self Care | Payer: Medicare HMO | Source: Ambulatory Visit | Attending: Cardiology | Admitting: Cardiology

## 2023-02-21 DIAGNOSIS — R0609 Other forms of dyspnea: Secondary | ICD-10-CM | POA: Insufficient documentation

## 2023-02-21 DIAGNOSIS — E782 Mixed hyperlipidemia: Secondary | ICD-10-CM | POA: Diagnosis not present

## 2023-02-21 DIAGNOSIS — I10 Essential (primary) hypertension: Secondary | ICD-10-CM | POA: Diagnosis not present

## 2023-02-21 DIAGNOSIS — E785 Hyperlipidemia, unspecified: Secondary | ICD-10-CM | POA: Diagnosis not present

## 2023-02-21 DIAGNOSIS — I472 Ventricular tachycardia, unspecified: Secondary | ICD-10-CM | POA: Diagnosis not present

## 2023-02-21 DIAGNOSIS — I2510 Atherosclerotic heart disease of native coronary artery without angina pectoris: Secondary | ICD-10-CM | POA: Insufficient documentation

## 2023-02-21 DIAGNOSIS — I5042 Chronic combined systolic (congestive) and diastolic (congestive) heart failure: Secondary | ICD-10-CM | POA: Diagnosis not present

## 2023-02-21 DIAGNOSIS — I700 Atherosclerosis of aorta: Secondary | ICD-10-CM | POA: Diagnosis not present

## 2023-02-21 LAB — NM PET CT CARDIAC PERFUSION MULTI W/ABSOLUTE BLOODFLOW
Base ST Depression (mm): 0 mm
LV dias vol: 125 mL (ref 62–150)
LV sys vol: 94 mL
MBFR: 2
Nuc Rest EF: 25 %
Nuc Stress EF: 35 %
Rest MBF: 0.4 ml/g/min
Rest Nuclear Isotope Dose: 21.9 mCi
Rest perfusion cavity size (mL): 41 mL
ST Depression (mm): 0 mm
Stress MBF: 0.8 ml/g/min
Stress Nuclear Isotope Dose: 21.6 mCi
Stress perfusion cavity size (mL): 118 mL
Stress/rest perfusion ratio: 2.88
TID: 2.88

## 2023-02-21 MED ORDER — REGADENOSON 0.4 MG/5ML IV SOLN
INTRAVENOUS | Status: AC
  Filled 2023-02-21: qty 5

## 2023-02-21 MED ORDER — REGADENOSON 0.4 MG/5ML IV SOLN
0.4000 mg | Freq: Once | INTRAVENOUS | Status: AC
  Administered 2023-02-21: 0.4 mg via INTRAVENOUS

## 2023-02-21 MED ORDER — RUBIDIUM RB82 GENERATOR (RUBYFILL)
17.0000 | PACK | Freq: Once | INTRAVENOUS | Status: AC
  Administered 2023-02-21: 21.61 via INTRAVENOUS

## 2023-02-21 MED ORDER — RUBIDIUM RB82 GENERATOR (RUBYFILL)
17.0000 | PACK | Freq: Once | INTRAVENOUS | Status: AC
  Administered 2023-02-21: 21.85 via INTRAVENOUS

## 2023-02-21 NOTE — Progress Notes (Signed)
Patient presents for a cardiac PET stress test and tolerated procedure without incident. Patient maintained acceptable vital signs throughout the test and was offered caffeine after test.  Patient ambulated out of department with a steady gait.  

## 2023-02-27 NOTE — Progress Notes (Unsigned)
Cardiology Office Note:    Date:  01/18/2023   ID:  Cameron Daniel, Cameron Daniel [birth year], MRN [id number]  PCP:  Donita Brooks, MD   [facility] Providers Cardiologist:  Meriam Sprague, MD Electrophysiologist:  Lanier Prude, MD  Advanced Heart Failure:  Arvilla Meres, MD  {  Referring MD: Donita Brooks, MD    History of Present Illness:    Cameron Daniel is a 77 y.o. male with a hx of CAD s/p CABG with LIMA-LAD, SVG-OM, SVG-dRCA, systolic HF with LVEF 25-30%, VT s/p ablation 05/2020 with ICD implantation, HTN, and HLD who was previously followed by Dr. Delton See who now returns to clinic for follow-up.  Per review of the record, the patient had MI in 1985 with unclear intervention, treated by Dr. Riley Kill at that time. Aortoiliac duplex 2014 showed no AAA. He did well for many years before presenting back to the ED August 2020 with fever and malaise. He was seen in the ER and given fluids and discharged but returned back with respiratory failure requiring intubation for PNA and CHF and NSTEMI. 2D echo 04/13/19 showed EF 25-30%, severe hypokinesis of the left ventricular, entire inferior wall and anteroseptal wall, mild LAE, small pericardial effusion. Covid testing was negative. Cath showed 3V CAD with low output. He was started on milrinone post-cath. He developed a large spontaneous RP bleed post cath (cath all done from arm). He ultimately underwent CABG on 04/26/19. Pre-op TEE EF 45-50%. Post CABG course was fortunately uneventful. Spironolactone was later stopped due to hyperkalemia, olmestartan stopped due to hypotension, and he followed with CHF team for a period of time before graduating from their services. He has since tolerated losartan OK. In 05/2020 he was admitted with persistent VT. He was initially treated with amiodarone but ultimately cardioverted in ED. LHC showed patent grafts without specific culprit. He was seen by EP and underwent EPS/ablation with scar  modification and ICD implantation. 2D Echo 05/2020 showed EF 45-50%, mild LVH, grade 2 DD, moderate LAE, normal RV.  Seen 06/2022 where he was feeling more fatigued which he attributed to his coreg. We decreased his dosing to see if this would help with symptoms.  TTE 07/2022 with LVEF 40-45%, global hypokinesis, G3DD, severe RV dysfunction with moderate RV enlargement, mild MR.  Was last seen in clinic on 08/2022 where he had lost weight and was struggling with GI issues and was planned to see GI. Otherwise was stable from a CV standpoint. Upper endoscopy revealed esophageal stricture which was symptomatic and dilated. Also benign fundic gland polyps. He was continued on PPI. CT abdomen pelvis with no acute pathology.  Today, the patient feels okay. States he is feeling tired, lightheadedness, has muscle aches and is struggling with depression. Has lost weight with loss of appetite which had an unrevealing work-up as detailed above. He is wondering if his coreg is contributing to his symptoms. No chest pain, SOB, orthopnea, PND or LE edema. Has bilateral leg pain and back pain with walking. He is wanting to trial on a lower dose of coreg to see if it helps.  Notably, he did trial off crestor and his muscle aches did not improve.  Past Medical History:  Diagnosis Date   Allergic rhinitis, cause unspecified    Allergy    Anxiety    Arthritis    [facility])    a couple places removed from eyelid   Carotid Doppler 04/2020   Carotid US 9/21: No evidence of  ICA stenosis bilaterally; right vertebral artery stenosis   Cataract    Chronic combined systolic (congestive) and diastolic (congestive) heart failure (HCC)    Coronary atherosclerosis of unspecified type of vessel, native or graft    a. MI 1985 with unclear details. b. CABG 03/2019 with LIMA -> LAD, SVG -> OM, SVG to dRCA.   Depression    PTSD    Diverticulosis of colon (without mention of hemorrhage)    Esophageal reflux    Esophageal  stricture    Essential hypertension    Former tobacco use    Hemorrhoids    Hyperlipidemia    ICD (implantable cardioverter-defibrillator) in place    Irritable bowel syndrome    Ischemic cardiomyopathy    Mild carotid artery disease (HCC)    a. 1-39% bilaterally 03/2019 duplex.   Myocardial infarction (HCC)    Nocturia    Obesity, unspecified    Orthostasis    Other acquired absence of organ    Personal history of colonic polyps    Psychosexual dysfunction with inhibited sexual excitement    Retroperitoneal bleed    Type II or unspecified type diabetes mellitus without mention of complication, not stated as uncontrolled    VT (ventricular tachycardia) (HCC)     Past Surgical History:  Procedure Laterality Date   ADENOIDECTOMY     CARPAL TUNNEL RELEASE     left    CHOLECYSTECTOMY     COLON RESECTION     18 inches   COLON SURGERY  2013   CORONARY ANGIOPLASTY     1985    CORONARY ARTERY BYPASS GRAFT N/A 04/26/2019   Procedure: CORONARY ARTERY BYPASS GRAFTING (CABG) x Three, using left internal mammary artery and right leg greater saphenous vein harvested endoscopically;  Surgeon: Alleen Borne, MD;  Location: [facility] OR;  Service: Open Heart Surgery;  Laterality: N/A;   ELBOW BURSA SURGERY     EYE SURGERY     ICD IMPLANT N/A 06/25/2020   Procedure: ICD IMPLANT;  Surgeon: Lanier Prude, MD;  Location: [facility] INVASIVE CV LAB;  Service: Cardiovascular;  Laterality: N/A;   LEFT HEART CATH AND CORS/GRAFTS ANGIOGRAPHY N/A 06/19/2020   Procedure: LEFT HEART CATH AND CORS/GRAFTS ANGIOGRAPHY;  Surgeon: Lennette Bihari, MD;  Location: [facility] INVASIVE CV LAB;  Service: Cardiovascular;  Laterality: N/A;   POLYPECTOMY     RIGHT/LEFT HEART CATH AND CORONARY ANGIOGRAPHY N/A 04/16/2019   Procedure: RIGHT/LEFT HEART CATH AND CORONARY ANGIOGRAPHY;  Surgeon: Lyn Records, MD;  Location: [facility] INVASIVE CV LAB;  Service: Cardiovascular;  Laterality: N/A;   SHOULDER SURGERY     Rt. Shoulder   TEE WITHOUT  CARDIOVERSION N/A 04/26/2019   Procedure: TRANSESOPHAGEAL ECHOCARDIOGRAM (TEE);  Surgeon: Alleen Borne, MD;  Location: [facility] OR;  Service: Open Heart Surgery;  Laterality: N/A;   TONSILLECTOMY     V TACH ABLATION N/A 06/22/2020   Procedure: V TACH ABLATION;  Surgeon: Lanier Prude, MD;  Location: [facility] INVASIVE CV LAB;  Service: Cardiovascular;  Laterality: N/A;    Current Medications: Current Meds  Medication Sig   acetaminophen (TYLENOL) 500 MG tablet Take 1,000 mg by mouth at bedtime as needed for mild pain.   aspirin EC 81 MG tablet Take 1 tablet (81 mg total) by mouth daily.   azelastine (ASTELIN) 0.1 % nasal spray Place 2 sprays into both nostrils 2 (two) times daily. Use in each nostril as directed   carvedilol (COREG) 6.25 MG tablet Take 1 tablet (6.25 mg  total) by mouth 2 (two) times daily.   Cetirizine HCl (ZERVIATE) 0.24 % SOLN Apply 1 drop to eye 2 (two) times daily.   colestipol (COLESTID) 5 g packet Take 5 g by mouth daily.   diphenoxylate-atropine (LOMOTIL) 2.5-0.025 MG tablet Take 2 tablets by mouth 4 (four) times daily as needed for diarrhea or loose stools.   empagliflozin (JARDIANCE) 25 MG TABS tablet TAKE 25 MG (1 TABLET) BY MOUTH DAILY BEFORE BREAKFAST. STOP PIOGLITAZONE   famotidine (PEPCID) 20 MG tablet TAKE 1 TABLET BY MOUTH EVERYDAY AT BEDTIME   fexofenadine (ALLEGRA) 180 MG tablet Take 1 tablet (180 mg total) by mouth daily as needed. For allergies   finasteride (PROSCAR) 5 MG tablet TAKE 1 TABLET BY MOUTH EVERY DAY   hydrocortisone (ANUSOL-HC) 2.5 % rectal cream Place 1 Application rectally at bedtime.   magnesium oxide (MAG-OX) 400 MG tablet Take 1 tablet (400 mg total) by mouth daily.   montelukast (SINGULAIR) 10 MG tablet TAKE 1 TABLET BY MOUTH EVERYDAY AT BEDTIME   neomycin-polymyxin b-dexamethasone (MAXITROL) 3.5-10000-0.1 SUSP PLEASE SEE ATTACHED FOR DETAILED DIRECTIONS   nystatin (MYCOSTATIN) 100000 UNIT/ML suspension Take 5 mLs (500,000 Units total) by  mouth 4 (four) times daily.   nystatin cream (MYCOSTATIN) Apply 1 Application topically 3 (three) times daily.   pantoprazole (PROTONIX) 40 MG tablet Take 1 tablet (40 mg total) by mouth 2 (two) times daily.   rosuvastatin (CRESTOR) 40 MG tablet TAKE 1 TABLET BY MOUTH DAILY AT 6 PM.   sitaGLIPtin (JANUVIA) 50 MG tablet Take 1 tablet (50 mg total) by mouth daily.   vitamin B-12 (CYANOCOBALAMIN) 100 MCG tablet Take 100 mcg by mouth daily.   [DISCONTINUED] carvedilol (COREG) 12.5 MG tablet Take one tablet in the AM and Take two tablets in the PM   [DISCONTINUED] citalopram (CELEXA) 10 MG tablet TAKE 1 TABLET BY MOUTH EVERY DAY     Allergies:   Doxazosin mesylate, Losartan, Testosterone, Flomax [tamsulosin hcl], Penicillins, Sulfonamide derivatives, and Trulicity [dulaglutide]   Social History   Socioeconomic History   Marital status: Married    Spouse name: Not on file   Number of children: 1   Years of education: 12   Highest education level: High school graduate  Occupational History   Occupation: Therapist, sports- retired  Tobacco Use   Smoking status: Former    Types: Cigarettes    Quit date: 08/30/1983    Years since quitting: 39.4   Smokeless tobacco: Former   Tobacco comments:    Quit 30 years ago.  Vaping Use   Vaping Use: Never used  Substance and Sexual Activity   Alcohol use: Yes    Comment: rare   Drug use: No   Sexual activity: Yes  Other Topics Concern   Not on file  Social History Narrative   Not on file   Social Determinants of Health   Financial Resource Strain: Low Risk  (12/22/2022)   Overall Financial Resource Strain (CARDIA)    Difficulty of Paying Living Expenses: Not hard at all  Food Insecurity: No Food Insecurity (12/22/2022)   Hunger Vital Sign    Worried About Running Out of Food in the Last Year: Never true    Ran Out of Food in the Last Year: Never true  Transportation Needs: No Transportation Needs (12/22/2022)   PRAPARE - Scientist, research (physical sciences) (Medical): No    Lack of Transportation (Non-Medical): No  Physical Activity: Insufficiently Active (12/22/2022)   Exercise Vital Sign  Days of Exercise per Week: 3 days    Minutes of Exercise per Session: 30 min  Stress: No Stress Concern Present (12/22/2022)   [employer] of Occupational Health - Occupational Stress Questionnaire    Feeling of Stress : Not at all  Social Connections: Moderately Isolated (12/22/2022)   Social Connection and Isolation Panel [NHANES]    Frequency of Communication with Friends and Family: More than three times a week    Frequency of Social Gatherings with Friends and Family: More than three times a week    Attends Religious Services: Never    Database administrator or Organizations: No    Attends Engineer, structural: Never    Marital Status: Married     Family History: The patient's family history includes Allergic rhinitis in his father; Coronary artery disease in his mother; Lung cancer in his father. There is no history of Colon cancer, Heart attack, Hypertension, Stroke, Esophageal cancer, Rectal cancer, Stomach cancer, or Pancreatic cancer.  ROS:   Please see the history of present illness.       EKGs/Labs/Other Studies Reviewed:    The following studies were reviewed today:  TTE 07/2022: IMPRESSIONS   1. Left ventricular ejection fraction, by estimation, is 40 to 45%. The  left ventricle has mildly decreased function. The left ventricle  demonstrates global hypokinesis. There is mild concentric left ventricular  hypertrophy. Left ventricular diastolic  parameters are consistent with Grade III diastolic dysfunction  (restrictive). Elevated left ventricular end-diastolic pressure.   2. Right ventricular systolic function is severely reduced. The right  ventricular size is moderately enlarged. There is normal pulmonary artery  systolic pressure. The estimated right ventricular systolic pressure is  27.7 mmHg.    3. Left atrial size was moderately dilated.   4. The mitral valve is degenerative. Mild mitral valve regurgitation. No  evidence of mitral stenosis. Moderate mitral annular calcification.   5. The aortic valve is tricuspid. There is severe calcifcation of the  aortic valve. There is moderate thickening of the aortic valve. Aortic  valve regurgitation is not visualized. No aortic stenosis is present.   6. The inferior vena cava is normal in size with <50% respiratory  variability, suggesting right atrial pressure of 8 mmHg.   ICD Implant 06/25/2020:  CONCLUSIONS:  1. Ischemic cardiomyopathy with chronic New York Heart Association class II heart failure and sustained ventricular tachycardia. 2. Successful ICD implantation with a Medtronic ICD implanted for secondary prevention of sudden death.  3. No early apparent complications.   V Tach Ablation 06/22/2020:  CONCLUSIONS: 1. Sinus rhythm upon presentation  2. Clinical VT noninducible. 3. Nonclinical VT induced and was not hemodynamically tolerated (positive precordial concordance, inferior axis, CL ) 4. Successful scar modification along the mid inferior wall extending to the lateral wall 5. A large inferior and inferolateral LV scar on ICE and 3d electroanatomic mapping. 6. No early apparent complications. 7. ICD discussion with the patient and family   TTE 06/20/20: IMPRESSIONS   1. Inferior and inferoseptal hypokinesis. Left ventricular ejection  fraction, by estimation, is 45 to 50%. The left ventricle has mildly  decreased function. The left ventricle demonstrates regional wall motion  abnormalities (see scoring diagram/findings   for description). There is mild concentric left ventricular hypertrophy.  Left ventricular diastolic parameters are consistent with Grade II  diastolic dysfunction (pseudonormalization).   2. Right ventricular systolic function is normal. The right ventricular  size is normal.   3. Left atrial  size was  moderately dilated.   4. The mitral valve is normal in structure. Trivial mitral valve  regurgitation. No evidence of mitral stenosis. Moderate mitral annular  calcification.   5. The aortic valve is calcified. There is mild calcification of the  aortic valve. There is mild thickening of the aortic valve. Aortic valve  regurgitation is not visualized. No aortic stenosis is present.   6. The inferior vena cava is dilated in size with <50% respiratory  variability, suggesting right atrial pressure of 15 mmHg.   LHC 05/2020: Ost LAD lesion is 80% stenosed. Prox LAD to Mid LAD lesion is 90% stenosed. Mid LAD lesion is 95% stenosed. Mid RCA lesion is 85% stenosed. Mid LM to Dist LM lesion is 70% stenosed. Prox Cx to Mid Cx lesion is 85% stenosed. Mid Cx to Dist Cx lesion is 95% stenosed. Non-stenotic Prox RCA lesion. Prox Graft lesion is 20% stenosed.   Severe native multivessel CAD with 70% distal left main stenosis, 80% ostial LAD stenosis followed by segmental 90% stenoses before and after the first diagonal vessel with 95% stenosis in the midsegment proximal to a septal perforating artery with competitive filling seen distally; diffuse 80 to 95% circumflex stenoses; ectatic proximal RCA with 80% mid stenoses.   Patent LIMA graft supplying the mid LAD.   Patent SVG supplying the distal circumflex marginal vessel branch.   Patent SVG supplying the distal RCA with mild smooth 20% focal proximal to mid narrowing.   Fairly preserved global LV function with EF estimated 50 to 55%.  There is a suggestion of possible apical hypertrophy with nipple protrusion of the apex.  LVEDP .   RECOMMENDATION: The patient's bypass conduits are widely patent.  Further EP evaluation for possible defibrillator or VT ablation.  EKG: No new tracing today  Recent Labs: 04/01/2022: Magnesium 1.7 12/05/2022: ALT 32; BUN 13; Creat 0.80; Hemoglobin 15.1; Platelets 180; Potassium 4.4; Sodium 136; TSH  1.32   Recent Lipid Panel    Component Value Date/Time   CHOL 116 05/10/2022 1105   TRIG 233 (H) 05/10/2022 1105   TRIG 193 (H) 08/08/2006 0924   HDL 61 05/10/2022 1105   CHOLHDL 1.9 05/10/2022 1105   VLDL 40 (H) 12/15/2016 0815   LDLCALC 26 05/10/2022 1105   LDLDIRECT 49.9 08/26/2013 1011         Physical Exam:    VS:  BP 114/66   Pulse 74   Ht 5\' 11"  (1.803 m)   Wt 192 lb 9.6 oz (87.4 kg)   SpO2 97%   BMI 26.86 kg/m     Wt Readings from Last 3 Encounters:  01/18/23 192 lb 9.6 oz (87.4 kg)  01/02/23 193 lb 3.2 oz (87.6 kg)  12/22/22 191 lb (86.6 kg)     GEN:  Well nourished, well developed in no acute distress HEENT: Normal NECK: No JVD; No carotid bruits CARDIAC: RRR, 2/6 systolic murmur, No rubs, no gallops RESPIRATORY:  Clear to auscultation without rales, wheezing or rhonchi  ABDOMEN: Soft, non-tender, non-distended MUSCULOSKELETAL:  Warm , no edema SKIN: Warm and dry NEUROLOGIC:  Alert and oriented x 3 PSYCHIATRIC:  Normal affect   ASSESSMENT:    1. Coronary artery disease involving native coronary artery of native heart without angina pectoris   2. CAD in native artery   3. Chronic combined systolic and diastolic CHF (congestive heart failure) (HCC)   4. Ventricular tachycardia (HCC)   5. Mixed hyperlipidemia   6. Essential hypertension   7. Hyperlipidemia LDL goal <  70   8. Claudication of both lower extremities (HCC)   9. DOE (dyspnea on exertion)      PLAN:    In order of problems listed above:  #CAD s/p CABG: Patient is s/p CABG with LIMA-LAD, SVG-OM, SVG to dRCA in 03/2019. Most recent cath in 08/2019 with patent grafts. Currently with progressive fatigue. Will check NM PET for further evaluation. -Check NM PET -Continue ASA 81mg  daily -Continue crestor 40mg  daily -Continue losartan 25mg  daily -Decrease coreg to 6.25mg  BID and see if helps with fatigue  #Chronic Combined Systolic and Diastolic HF: Patient with known history of combined  systolic and diastolic HF. EF 2020 25-30% which improved to 45-50% in 2021 but dropped slightly to 40-45% in 07/2022. Currently, he is euvolemic and compensated on exam. GDMT limited due to hypotension.  -Decrease coreg to 6.25mg  BID and see if helps with fatigue -Continue jardiance 25mg  daily -Off losartan due to hypotension -Low Na diet; monitoring weights  #RV dysfunction: Appears to be mildly to moderately depressed on personal review of TTE. Will continue with medical therapy.  #Bilateral Leg Pain: -Complains of pain down both legs with ambulation that improves with rest -Will check ABI for further evaluation  #VT s/p ablation: Underwent VT ablation and ICD implantation with Dr. Lalla Brothers. -Follow-up with EP as scheduled -Decrease coreg to 6.25mg  BID and see if helps with fatigue  #HTN: Blood pressure mainly 110s at home -Decrease coreg to 6.25mg  BID and see if helps with fatigue -Off losartan due to hypotension  #Weight Loss: GI work-up revealed esophageal stricture which was dilated but no evidence of malignancy on EGD or CT a/p. Currently appetite improved. Follows with Dr. Marina Goodell.  #Depression: -Resume citalopram 10mg  daily -Follow-up with PCP       Medication Adjustments/Labs and Tests Ordered: Current medicines are reviewed at length with the patient today.  Concerns regarding medicines are outlined above.   Orders Placed This Encounter  Procedures   NM PET CT CARDIAC PERFUSION MULTI W/ABSOLUTE BLOODFLOW   VAS [place] LOWER EXTREMITY ARTERIAL DUPLEX   VAS [place] ABI WITH/WO TBI   Meds ordered this encounter  Medications   citalopram (CELEXA) 10 MG tablet    Sig: Take 1 tablet (10 mg total) by mouth daily.    Dispense:  90 tablet    Refill:  1   carvedilol (COREG) 6.25 MG tablet    Sig: Take 1 tablet (6.25 mg total) by mouth 2 (two) times daily.    Dispense:  180 tablet    Refill:  2    Dose decrease   Patient Instructions  Medication Instructions:   DECREASE  YOUR CARVEDILOL TO 6.25 MG BY MOUTH TWICE DAILY  *If you need a refill on your cardiac medications before your next appointment, please call your pharmacy*   Testing/Procedures:  Your physician has requested that you have a lower extremity arterial duplex. This test is an ultrasound of the arteries in the legs or arms. It looks at arterial blood flow in the legs and arms. Allow one hour for Lower and Upper Arterial scans. There are no restrictions or special instructions   Your physician has requested that you have an ankle brachial index (ABI). During this test an ultrasound and blood pressure cuff are used to evaluate the arteries that supply the arms and legs with blood. Allow thirty minutes for this exam. There are no restrictions or special instructions.   How to Prepare for Your Cardiac PET/CT Stress Test:  1. Please do  not take these medications before your test:   Medications that may interfere with the cardiac pharmacological stress agent (ex. nitrates - including erectile dysfunction medications, isosorbide mononitrate, tamulosin or beta-blockers) the day of the exam. (Erectile dysfunction medication should be held for at least 72 hrs prior to test)  DO NOT TAKE CARVEDILOL THE DAY OF THE EXAM  Your remaining medications may be taken with water.  2. Nothing to eat or drink, except water, 3 hours prior to arrival time.   NO caffeine/decaffeinated products, or chocolate 12 hours prior to arrival.  3. NO perfume, cologne or lotion  4. Total time is 1 to 2 hours; you may want to bring reading material for the waiting time.  5. Please report to Radiology at the [facility] Main Entrance 30 minutes early for your test.  [address] [place], [place] [address]  Diabetic Preparation:  Hold oral medications. You may take NPH and Lantus insulin. Do not take Humalog or Humulin R (Regular Insulin) the day of your test. Check blood sugars prior to leaving the house. If  able to eat breakfast prior to 3 hour fasting, you may take all medications, including your insulin, Do not worry if you miss your breakfast dose of insulin - start at your next meal.    In preparation for your appointment, medication and supplies will be purchased.  Appointment availability is limited, so if you need to cancel or reschedule, please call the Radiology Department at [phone]  24 hours in advance to avoid a cancellation fee of $100.00  What to Expect After you Arrive:  Once you arrive and check in for your appointment, you will be taken to a preparation room within the Radiology Department.  A technologist or Nurse will obtain your medical history, verify that you are correctly prepped for the exam, and explain the procedure.  Afterwards,  an IV will be started in your arm and electrodes will be placed on your skin for EKG monitoring during the stress portion of the exam. Then you will be escorted to the PET/CT scanner.  There, staff will get you positioned on the scanner and obtain a blood pressure and EKG.  During the exam, you will continue to be connected to the EKG and blood pressure machines.  A small, safe amount of a radioactive tracer will be injected in your IV to obtain a series of pictures of your heart along with an injection of a stress agent.    After your Exam:  It is recommended that you eat a meal and drink a caffeinated beverage to counter act any effects of the stress agent.  Drink plenty of fluids for the remainder of the day and urinate frequently for the first couple of hours after the exam.  Your doctor will inform you of your test results within 7-10 business days.  For questions about your test or how to prepare for your test, please call: Rockwell Alexandria, Cardiac Imaging Nurse Navigator  Larey Brick, Cardiac Imaging Nurse Navigator Office: [phone]    Follow-Up:  3 MONTHS WITH DR. Gerri Spore O'NEAL      Signed, Meriam Sprague, MD   01/18/2023 12:06 PM    [facility]

## 2023-02-27 NOTE — H&P (View-Only) (Signed)
Cardiology Office Note:    Date:  03/01/2023   ID:  Eyob, Worm 1946/01/31, MRN [id number]  PCP:  Donita Brooks, MD   [facility] Providers Cardiologist:  Meriam Sprague, MD Electrophysiologist:  Lanier Prude, MD  Advanced Heart Failure:  Arvilla Meres, MD  {  Referring MD: Donita Brooks, MD    History of Present Illness:    Cameron Daniel is a 77 y.o. male with a hx of CAD s/p CABG with LIMA-LAD, SVG-OM, SVG-dRCA, systolic HF with LVEF 25-30%, VT s/p ablation 05/2020 with ICD implantation, HTN, and HLD who was previously followed by Dr. Delton See who now returns to clinic for follow-up.  Per review of the record, the patient had MI in 1985 with unclear intervention, treated by Dr. Riley Kill at that time. Aortoiliac duplex 2014 showed no AAA. He did well for many years before presenting back to the ED August 2020 with fever and malaise. He was seen in the ER and given fluids and discharged but returned back with respiratory failure requiring intubation for PNA and CHF and NSTEMI. 2D echo 04/13/19 showed EF 25-30%, severe hypokinesis of the left ventricular, entire inferior wall and anteroseptal wall, mild LAE, small pericardial effusion. Covid testing was negative. Cath showed 3V CAD with low output. He was started on milrinone post-cath. He developed a large spontaneous RP bleed post cath (cath all done from arm). He ultimately underwent CABG on 04/26/19. Pre-op TEE EF 45-50%. Post CABG course was fortunately uneventful. Spironolactone was later stopped due to hyperkalemia, olmestartan stopped due to hypotension, and he followed with CHF team for a period of time before graduating from their services. He has since tolerated losartan OK. In 05/2020 he was admitted with persistent VT. He was initially treated with amiodarone but ultimately cardioverted in ED. LHC showed patent grafts without specific culprit. He was seen by EP and underwent EPS/ablation with scar  modification and ICD implantation. 2D Echo 05/2020 showed EF 45-50%, mild LVH, grade 2 DD, moderate LAE, normal RV.  Seen 06/2022 where he was feeling more fatigued which he attributed to his coreg. We decreased his dosing to see if this would help with symptoms.  TTE 07/2022 with LVEF 40-45%, global hypokinesis, G3DD, severe RV dysfunction with moderate RV enlargement, mild MR.  Was  seen in clinic on 08/2022 where he had lost weight and was struggling with GI issues and was planned to see GI. Otherwise was stable from a CV standpoint. Upper endoscopy revealed esophageal stricture which was dilated. Also benign fundic gland polyps. He was continued on PPI. CT abdomen pelvis with no acute pathology.  Was seen in clinic on 12/2022 where he was having increased fatigue with exertion.  NM PET showed a large, severe defect in the basal-to-apical inferior and inferolateral locations that was partially reversible. Rest EF 25%, Stress EF 35%. MBFR 2 but overall low. TID also present.  Today, the patient overall feels okay. States that he recently recovered from a UTI and his fatigue improved. No current chest pain, SOB, orthopnea, or PND. We discussed the results of his stress test at length. Given the presence of significant TID and region of decent ischemia, will plan for cath at this time. Patient worried about groin access as has history of RP bleed. Discussed that we will be very careful.  Past Medical History:  Diagnosis Date   Allergic rhinitis, cause unspecified    Allergy    Anxiety    Arthritis  Cancer [facility])    a couple places removed from eyelid   Carotid Doppler 04/2020   Carotid US 9/21: No evidence of ICA stenosis bilaterally; right vertebral artery stenosis   Cataract    Chronic combined systolic (congestive) and diastolic (congestive) heart failure (HCC)    Coronary atherosclerosis of unspecified type of vessel, native or graft    a. MI 1985 with unclear details. b. CABG 03/2019  with LIMA -> LAD, SVG -> OM, SVG to dRCA.   Depression    PTSD    Diverticulosis of colon (without mention of hemorrhage)    Esophageal reflux    Esophageal stricture    Essential hypertension    Former tobacco use    Hemorrhoids    Hyperlipidemia    ICD (implantable cardioverter-defibrillator) in place    Irritable bowel syndrome    Ischemic cardiomyopathy    Mild carotid artery disease (HCC)    a. 1-39% bilaterally 03/2019 duplex.   Myocardial infarction (HCC)    Nocturia    Obesity, unspecified    Orthostasis    Other acquired absence of organ    Personal history of colonic polyps    Psychosexual dysfunction with inhibited sexual excitement    Retroperitoneal bleed    Type II or unspecified type diabetes mellitus without mention of complication, not stated as uncontrolled    VT (ventricular tachycardia) (HCC)     Past Surgical History:  Procedure Laterality Date   ADENOIDECTOMY     CARPAL TUNNEL RELEASE     left    CHOLECYSTECTOMY     COLON RESECTION     18 inches   COLON SURGERY  2013   CORONARY ANGIOPLASTY     1985    CORONARY ARTERY BYPASS GRAFT N/A 04/26/2019   Procedure: CORONARY ARTERY BYPASS GRAFTING (CABG) x Three, using left internal mammary artery and right leg greater saphenous vein harvested endoscopically;  Surgeon: Alleen Borne, MD;  Location: [facility] OR;  Service: Open Heart Surgery;  Laterality: N/A;   ELBOW BURSA SURGERY     EYE SURGERY     ICD IMPLANT N/A 06/25/2020   Procedure: ICD IMPLANT;  Surgeon: Lanier Prude, MD;  Location: [facility] INVASIVE CV LAB;  Service: Cardiovascular;  Laterality: N/A;   LEFT HEART CATH AND CORS/GRAFTS ANGIOGRAPHY N/A 06/19/2020   Procedure: LEFT HEART CATH AND CORS/GRAFTS ANGIOGRAPHY;  Surgeon: Lennette Bihari, MD;  Location: [facility] INVASIVE CV LAB;  Service: Cardiovascular;  Laterality: N/A;   POLYPECTOMY     RIGHT/LEFT HEART CATH AND CORONARY ANGIOGRAPHY N/A 04/16/2019   Procedure: RIGHT/LEFT HEART CATH AND CORONARY  ANGIOGRAPHY;  Surgeon: Lyn Records, MD;  Location: [facility] INVASIVE CV LAB;  Service: Cardiovascular;  Laterality: N/A;   SHOULDER SURGERY     Rt. Shoulder   TEE WITHOUT CARDIOVERSION N/A 04/26/2019   Procedure: TRANSESOPHAGEAL ECHOCARDIOGRAM (TEE);  Surgeon: Alleen Borne, MD;  Location: [facility] OR;  Service: Open Heart Surgery;  Laterality: N/A;   TONSILLECTOMY     V TACH ABLATION N/A 06/22/2020   Procedure: V TACH ABLATION;  Surgeon: Lanier Prude, MD;  Location: [facility] INVASIVE CV LAB;  Service: Cardiovascular;  Laterality: N/A;    Current Medications: Current Meds  Medication Sig   acetaminophen (TYLENOL) 500 MG tablet Take 1,000 mg by mouth at bedtime as needed for mild pain.   aspirin EC 81 MG tablet Take 1 tablet (81 mg total) by mouth daily.   azelastine (ASTELIN) 0.1 % nasal spray Place 2 sprays into  both nostrils 2 (two) times daily. Use in each nostril as directed   carvedilol (COREG) 6.25 MG tablet Take 1 tablet (6.25 mg total) by mouth 2 (two) times daily.   Cetirizine HCl (ZERVIATE) 0.24 % SOLN Apply 1 drop to eye 2 (two) times daily.   cholestyramine (QUESTRAN) 4 g packet Take 0.5 packets (2 g total) by mouth 2 (two) times daily.   citalopram (CELEXA) 10 MG tablet Take 1 tablet (10 mg total) by mouth daily.   diphenoxylate-atropine (LOMOTIL) 2.5-0.025 MG tablet Take 2 tablets by mouth 4 (four) times daily as needed for diarrhea or loose stools.   empagliflozin (JARDIANCE) 25 MG TABS tablet TAKE 25 MG (1 TABLET) BY MOUTH DAILY BEFORE BREAKFAST. STOP PIOGLITAZONE   fexofenadine (ALLEGRA) 180 MG tablet Take 1 tablet (180 mg total) by mouth daily as needed. For allergies   finasteride (PROSCAR) 5 MG tablet TAKE 1 TABLET BY MOUTH EVERY DAY   hydrocortisone (ANUSOL-HC) 2.5 % rectal cream Place 1 Application rectally at bedtime.   magnesium oxide (MAG-OX) 400 MG tablet Take 1 tablet (400 mg total) by mouth daily.   montelukast (SINGULAIR) 10 MG tablet TAKE 1 TABLET BY MOUTH EVERYDAY AT  BEDTIME   neomycin-polymyxin b-dexamethasone (MAXITROL) 3.5-10000-0.1 SUSP PLEASE SEE ATTACHED FOR DETAILED DIRECTIONS   nystatin (MYCOSTATIN) 100000 UNIT/ML suspension Take 5 mLs (500,000 Units total) by mouth 4 (four) times daily.   nystatin cream (MYCOSTATIN) Apply 1 Application topically 3 (three) times daily.   pantoprazole (PROTONIX) 40 MG tablet Take 1 tablet (40 mg total) by mouth 2 (two) times daily.   rosuvastatin (CRESTOR) 40 MG tablet TAKE 1 TABLET BY MOUTH DAILY AT 6 PM.   sitaGLIPtin (JANUVIA) 50 MG tablet Take 1 tablet (50 mg total) by mouth daily.   vitamin B-12 (CYANOCOBALAMIN) 100 MCG tablet Take 100 mcg by mouth daily.   [DISCONTINUED] famotidine (PEPCID) 20 MG tablet TAKE 1 TABLET BY MOUTH EVERYDAY AT BEDTIME   [DISCONTINUED] metroNIDAZOLE (FLAGYL) 250 MG tablet Take 1 tablet (250 mg total) by mouth 3 (three) times daily.     Allergies:   Doxazosin mesylate, Losartan, Testosterone, Flomax [tamsulosin hcl], Penicillins, Sulfonamide derivatives, and Trulicity [dulaglutide]   Social History   Socioeconomic History   Marital status: Married    Spouse name: Not on file   Number of children: 1   Years of education: 12   Highest education level: High school graduate  Occupational History   Occupation: Therapist, sports- retired  Tobacco Use   Smoking status: Former    Types: Cigarettes    Quit date: 08/30/1983    Years since quitting: 39.5   Smokeless tobacco: Former   Tobacco comments:    Quit 30 years ago.  Vaping Use   Vaping Use: Never used  Substance and Sexual Activity   Alcohol use: Yes    Comment: rare   Drug use: No   Sexual activity: Yes  Other Topics Concern   Not on file  Social History Narrative   Not on file   Social Determinants of Health   Financial Resource Strain: Low Risk  (12/22/2022)   Overall Financial Resource Strain (CARDIA)    Difficulty of Paying Living Expenses: Not hard at all  Food Insecurity: No Food Insecurity (12/22/2022)   Hunger Vital  Sign    Worried About Running Out of Food in the Last Year: Never true    Ran Out of Food in the Last Year: Never true  Transportation Needs: No Transportation Needs (12/22/2022)  PRAPARE - Administrator, Civil Service (Medical): No    Lack of Transportation (Non-Medical): No  Physical Activity: Insufficiently Active (12/22/2022)   Exercise Vital Sign    Days of Exercise per Week: 3 days    Minutes of Exercise per Session: 30 min  Stress: No Stress Concern Present (12/22/2022)   [employer] of Occupational Health - Occupational Stress Questionnaire    Feeling of Stress : Not at all  Social Connections: Moderately Isolated (12/22/2022)   Social Connection and Isolation Panel [NHANES]    Frequency of Communication with Friends and Family: More than three times a week    Frequency of Social Gatherings with Friends and Family: More than three times a week    Attends Religious Services: Never    Database administrator or Organizations: No    Attends Engineer, structural: Never    Marital Status: Married     Family History: The patient's family history includes Allergic rhinitis in his father; Coronary artery disease in his mother; Lung cancer in his father. There is no history of Colon cancer, Heart attack, Hypertension, Stroke, Esophageal cancer, Rectal cancer, Stomach cancer, or Pancreatic cancer.  ROS:   Please see the history of present illness.       EKGs/Labs/Other Studies Reviewed:    The following studies were reviewed today:  Cardiac Studies & Procedures   CARDIAC CATHETERIZATION  CARDIAC CATHETERIZATION 06/22/2020  Narrative  Ost LAD lesion is 80% stenosed.  Prox LAD to Mid LAD lesion is 90% stenosed.  Mid LAD lesion is 95% stenosed.  Mid RCA lesion is 85% stenosed.  Mid LM to Dist LM lesion is 70% stenosed.  Prox Cx to Mid Cx lesion is 85% stenosed.  Mid Cx to Dist Cx lesion is 95% stenosed.  Non-stenotic Prox RCA lesion.  Prox  Graft lesion is 20% stenosed.  Severe native multivessel CAD with 70% distal left main stenosis, 80% ostial LAD stenosis followed by segmental 90% stenoses before and after the first diagonal vessel with 95% stenosis in the midsegment proximal to a septal perforating artery with competitive filling seen distally; diffuse 80 to 95% circumflex stenoses; ectatic proximal RCA with 80% mid stenoses.  Patent LIMA graft supplying the mid LAD.  Patent SVG supplying the distal circumflex marginal vessel branch.  Patent SVG supplying the distal RCA with mild smooth 20% focal proximal to mid narrowing.  Fairly preserved global LV function with EF estimated 50 to 55%.  There is a suggestion of possible apical hypertrophy with nipple protrusion of the apex.  LVEDP .  RECOMMENDATION: The patient's bypass conduits are widely patent.  Further EP evaluation for possible defibrillator or VT ablation.  Findings Coronary Findings Diagnostic  Dominance: Right  Left Main Mid LM to Dist LM lesion is 70% stenosed.  Left Anterior Descending Ost LAD lesion is 80% stenosed. Prox LAD to Mid LAD lesion is 90% stenosed. Mid LAD lesion is 95% stenosed.  Left Circumflex Prox Cx to Mid Cx lesion is 85% stenosed. Mid Cx to Dist Cx lesion is 95% stenosed.  First Obtuse Marginal Branch Vessel is small in size.  Second Obtuse Marginal Branch Vessel is small in size.  Third Obtuse Marginal Branch Vessel is large in size.  Right Coronary Artery Non-stenotic Prox RCA lesion. Mid RCA lesion is 85% stenosed.  LIMA Graft To Mid LAD  Graft To 3rd Mrg  Graft To Dist RCA Prox Graft lesion is 20% stenosed.  Intervention  No interventions have  been documented.   CARDIAC CATHETERIZATION  CARDIAC CATHETERIZATION 04/16/2019  Narrative  Severe three-vessel coronary artery disease involving the ostial proximal and mid LAD, the mid RCA, and the mid and distal circumflex.  There is moderate but not  angiographically significant left main disease.  Global hypokinesis with reduced EF less than 30% has been noted by echocardiography.  Overall picture is consistent with ischemic cardiomyopathy, possibly worsened EF with stunned myocardium in the setting of severe three-vessel CAD and increased demand caused by pneumonia and intubation.  Hemodynamics suggest that volume status is stable.  Cardiac output is low due to left ventricular hypocontractility.  RECOMMENDATIONS:   Strong consideration for surgical revascularization once medical status is improved.  Guideline directed therapy for left ventricular systolic dysfunction which should also include anti-ischemic therapy.  Preventive therapy with high intensity statin therapy.  Consider advanced heart failure team assessment to help optimize clinical condition.  Debate whether viability imaging is necessary.  Findings Coronary Findings Diagnostic  Dominance: Right  Left Main Ost LM to Dist LM lesion is 40% stenosed.  Left Anterior Descending Ost LAD lesion is 65% stenosed. Prox LAD to Mid LAD lesion is 90% stenosed. Mid LAD lesion is 95% stenosed. Dist LAD lesion is 60% stenosed.  Left Circumflex Mid Cx lesion is 85% stenosed. Dist Cx lesion is 95% stenosed.  First Obtuse Marginal Branch Vessel is small in size.  Second Obtuse Marginal Branch Vessel is small in size.  Third Obtuse Marginal Branch Vessel is large in size.  Right Coronary Artery Mid RCA lesion is 85% stenosed.  Intervention  No interventions have been documented.   STRESS TESTS  NM PET CT CARDIAC PERFUSION MULTI W/ABSOLUTE BLOODFLOW 02/21/2023  Narrative   LV perfusion is abnormal. There is evidence of ischemia. There is evidence of infarction. Defect 1: There is a large defect with severe reduction in uptake present in the apical to basal inferior and inferolateral location(s) that is partially reversible. There is abnormal wall motion in the  defect area. Consistent with infarction and peri-infarct ischemia.   Rest left ventricular function is abnormal. Rest global function is severely reduced. There was a single regional abnormality. Rest EF: 25 %. Stress left ventricular function is abnormal. Stress global function is severely reduced. There was a single regional abnormality. Stress EF: 35 %. End diastolic cavity size is moderately enlarged. End systolic cavity size is moderately enlarged.   Myocardial blood flow was computed to be 0.5ml/g/min at rest and 0.57ml/g/min at stress. Global myocardial blood flow reserve was 2.00 and was mildly abnormal.   Findings are consistent with infarction with peri-infarct ischemia. The study is high risk.   Severe resting decrease in EF 25% with RWMA in large area of inferior lateral wall infarct with mild peri infarct ischemia. TID present visually and quantitatively 2.88. MBFR abnormal in defect area 1.37  EXAM: OVER-READ INTERPRETATION  CT CHEST  The following report is a limited chest CT over-read performed by radiologist Dr. Irma Newness [facility] on 02/21/2023. This over-read does not include interpretation of cardiac or coronary anatomy or pathology nor does it include evaluation of the PET data. The cardiac PET-CT interpretation by the cardiologist is attached.  COMPARISON:  Chest radiographs 08/02/2022.  Chest CT 04/18/2019.  FINDINGS: Mediastinum/Nodes: No enlarged lymph nodes within the visualized mediastinum.Postoperative changes from previous CABG. There are aortic valvular calcifications and an AICD.  Lungs/Pleura: No pleural effusion or pneumothorax. Chronic central airway thickening and mild dependent atelectasis at both lung bases.  Upper abdomen: No significant findings in the visualized upper abdomen.  Musculoskeletal/Chest wall: No chest wall mass or suspicious osseous findings within the visualized chest.  IMPRESSION: 1. No acute extracardiac  findings. 2. Chronic central airway thickening and mild dependent atelectasis at both lung bases. 3.  Aortic Atherosclerosis (ICD10-I70.0).   Electronically Signed By: Carey Bullocks M.D. On: 02/21/2023 12:24   ECHOCARDIOGRAM  ECHOCARDIOGRAM COMPLETE 08/10/2022  Narrative ECHOCARDIOGRAM REPORT    Patient Name:   Cameron Daniel Date of Exam: 08/10/2022 Medical Rec #:  [id number]          Height:       71.0 in Accession #:    [id number]         Weight:       212.0 lb Date of Birth:  September 22, 1945          BSA:          2.161 m Patient Age:    76 years           BP:           116/68 mmHg Patient Gender: M                  HR:           64 bpm. Exam Location:  [address]  Procedure: 2D Echo, Cardiac Doppler, Color Doppler and Intracardiac Opacification Agent  Indications:    Systolic Murmur; Heart Failure; R01.1 Murmur  History:        Patient has prior history of Echocardiogram examinations, most recent 06/20/2020. Previous Myocardial Infarction and CAD, Defibrillator and Prior CABG; Risk Factors:Dyslipidemia and Diabetes.  Sonographer:    Thurman Coyer RDCS Referring Phys: [id number] [facility] E [place]   Sonographer Comments: Technically difficult study due to poor echo windows. IMPRESSIONS   1. Left ventricular ejection fraction, by estimation, is 40 to 45%. The left ventricle has mildly decreased function. The left ventricle demonstrates global hypokinesis. There is mild concentric left ventricular hypertrophy. Left ventricular diastolic parameters are consistent with Grade III diastolic dysfunction (restrictive). Elevated left ventricular end-diastolic pressure. 2. Right ventricular systolic function is severely reduced. The right ventricular size is moderately enlarged. There is normal pulmonary artery systolic pressure. The estimated right ventricular systolic pressure is 27.7 mmHg. 3. Left atrial size was moderately dilated. 4. The mitral valve is  degenerative. Mild mitral valve regurgitation. No evidence of mitral stenosis. Moderate mitral annular calcification. 5. The aortic valve is tricuspid. There is severe calcifcation of the aortic valve. There is moderate thickening of the aortic valve. Aortic valve regurgitation is not visualized. No aortic stenosis is present. 6. The inferior vena cava is normal in size with <50% respiratory variability, suggesting right atrial pressure of 8 mmHg.  FINDINGS Left Ventricle: Left ventricular ejection fraction, by estimation, is 40 to 45%. The left ventricle has mildly decreased function. The left ventricle demonstrates global hypokinesis. Definity contrast agent was given IV to delineate the left ventricular endocardial borders. The left ventricular internal cavity size was normal in size. There is mild concentric left ventricular hypertrophy. Left ventricular diastolic parameters are consistent with Grade III diastolic dysfunction (restrictive). Elevated left ventricular end-diastolic pressure.  Right Ventricle: The right ventricular size is moderately enlarged. No increase in right ventricular wall thickness. Right ventricular systolic function is severely reduced. There is normal pulmonary artery systolic pressure. The tricuspid regurgitant velocity is 2.22 m/s, and with an assumed right atrial pressure of 8 mmHg, the estimated right ventricular systolic pressure is  27.7 mmHg.  Left Atrium: Left atrial size was moderately dilated.  Right Atrium: Right atrial size was normal in size.  Pericardium: There is no evidence of pericardial effusion.  Mitral Valve: The mitral valve is degenerative in appearance. There is mild thickening of the mitral valve leaflet(s). There is mild calcification of the mitral valve leaflet(s). Moderate mitral annular calcification. Mild mitral valve regurgitation. No evidence of mitral valve stenosis.  Tricuspid Valve: The tricuspid valve is normal in structure.  Tricuspid valve regurgitation is mild . No evidence of tricuspid stenosis.  Aortic Valve: The aortic valve is tricuspid. There is severe calcifcation of the aortic valve. There is moderate thickening of the aortic valve. Aortic valve regurgitation is not visualized. No aortic stenosis is present.  Pulmonic Valve: The pulmonic valve was normal in structure. Pulmonic valve regurgitation is trivial. No evidence of pulmonic stenosis.  Aorta: The aortic root is normal in size and structure.  Venous: The inferior vena cava is normal in size with less than 50% respiratory variability, suggesting right atrial pressure of 8 mmHg.  IAS/Shunts: No atrial level shunt detected by color flow Doppler.  Additional Comments: A device lead is visualized.   LEFT VENTRICLE PLAX 2D LVIDd:         5.20 cm     Diastology LVIDs:         4.10 cm     LV e' medial:    3.90 cm/s LV PW:         1.30 cm     LV E/e' medial:  35.1 LV IVS:        1.30 cm     LV e' lateral:   5.93 cm/s LVOT diam:     2.30 cm     LV E/e' lateral: 23.1 LV SV:         54 LV SV Index:   25 LVOT Area:     4.15 cm  LV Volumes (MOD) LV vol d, MOD A4C: 96.8 ml LV vol s, MOD A4C: 62.0 ml LV SV MOD A4C:     96.8 ml  RIGHT VENTRICLE RV Basal diam:  4.60 cm RV Mid diam:    4.80 cm RV S prime:     5.40 cm/s TAPSE (M-mode): 1.2 cm  LEFT ATRIUM              Index        RIGHT ATRIUM           Index LA diam:        4.70 cm  2.17 cm/m   RA Area:     19.50 cm LA Vol (A2C):   119.0 ml 55.06 ml/m  RA Volume:   50.70 ml  23.46 ml/m LA Vol (A4C):   80.6 ml  37.29 ml/m LA Biplane Vol: 98.1 ml  45.39 ml/m AORTIC VALVE LVOT Vmax:   65.00 cm/s LVOT Vmean:  40.400 cm/s LVOT VTI:    0.129 m  AORTA Ao Root diam: 3.20 cm Ao Asc diam:  3.30 cm  MITRAL VALVE                TRICUSPID VALVE MV Area (PHT): 4.89 cm     TR Peak grad:   19.7 mmHg MV Decel Time: 155 msec     TR Vmax:        222.00 cm/s MV E velocity: 137.00 cm/s MV A  velocity: 33.50 cm/s   SHUNTS MV E/A ratio:  4.09  Systemic VTI:  0.13 m Systemic Diam: 2.30 cm  Armanda Magic MD Electronically signed by Armanda Magic MD Signature Date/Time: 08/10/2022/12:55:21 PM    Final   TEE  ECHO INTRAOPERATIVE TEE 04/26/2019  Narrative *INTRAOPERATIVE TRANSESOPHAGEAL REPORT *    Patient Name:   MOHAMMAD OROS Date of Exam: 04/26/2019 Medical Rec #:  [id number]          Height:       71.0 in Accession #:    [id number]         Weight:       241.0 lb Date of Birth:  [birth year]          BSA:          2.28 m Patient Age:    73 years           BP:           110/61 mmHg Patient Gender: M                  HR:           89 bpm. Exam Location:  Inpatient  Transesophogeal exam was perform intraoperatively during surgical procedure. Patient was closely monitored under general anesthesia during the entirety of examination.  Indications:     CAD History:         CAD Risk Factors: Hypertension, Diabetes and Dyslipidemia. Performing Phys: [id number] Alleen Borne Diagnosing Phys: Kipp Brood MD  Complications: No known complications during this procedure. POST-OP IMPRESSIONS - Left Ventricle: The left ventricle is unchanged from pre-bypass. has mildly reduced systolic function. - Aorta: The aorta appears unchanged from pre-bypass. - Aortic Valve: The aortic valve appears unchanged from pre-bypass. - Mitral Valve: The mitral valve appears unchanged from pre-bypass. - Tricuspid Valve: The tricuspid valve appears unchanged from pre-bypass. - Interatrial Septum: The interatrial septum appears unchanged from pre-bypass.  PRE-OP FINDINGS Left Ventricle: The left ventricle has mildly reduced systolic function, with an ejection fraction of 45-50%. The cavity size was normal. mild concentric LV hypertrophy. There was hypokinesis of inferior septum. The LV wall thickness was 1.15 cm.  Right Ventricle: The right ventricle has normal systolic function. The cavity  was normal. There is no increase in right ventricular wall thickness.  Left Atrium: Left atrial size was normal in size. The left atrial appendage is well visualized and there is no evidence of thrombus present.  Right Atrium: Right atrial size was normal in size.  Interatrial Septum: No atrial level shunt detected by color flow Doppler. Increased thickness of the atrial septum sparing the fossa ovalis consistent with lipomatous hypertrophy of the interatrial septum.  Pericardium: Trivial pericardial effusion is present. The pericardial effusion is posterior to the left ventricle.  Mitral Valve: The mitral valve is degenerative in appearance. Mild thickening of the mitral valve leaflet. Moderate calcification of the mitral annulus. Mitral valve regurgitation is trivial by color flow Doppler. There is no evidence of mitral valve vegetation. There was moderate mitral annular calcification most prominent at the base of the posterior leaflet. There was restricted motion of the posterior leaflet, but no evidence of mitral stenosis.   Tricuspid Valve: The tricuspid valve was normal in structure. Tricuspid valve regurgitation is trivial by color flow Doppler. The tricuspid valve is mildly thickened.  Aortic Valve: The aortic valve is tricuspid There is moderate thickening of the aortic valve and There is moderate sclerosis of the aortic valve aortic valve regurgitation was not visualized by color flow Doppler. There is  no evidence of aortic valve stenosis. There is no evidence of a vegetation on the aortic valve.  Pulmonic Valve: The pulmonic valve was normal in structure. Pulmonic valve regurgitation is not visualized by color flow Doppler.   Aorta: There was a well defined aortic root and sino-tubular junction without dilatation or effacement. There was intimal thickening and calcification of the walls of the asecending aorta especially at the area of the right coronary cusp. There was  no protruding atheromatous disease present.  The asecending aorta and aortic roort were normal in diameter.  The descending aorta was normal in diameter with intimal thickening present.   Kipp Brood MD Electronically signed by Kipp Brood MD Signature Date/Time: 04/26/2019/5:01:43 PM    Final             EKG: NSR, RBBB, LVH, inferior infarct-personally reviewed  Recent Labs: 04/01/2022: Magnesium 1.7 12/05/2022: ALT 32; BUN 13; Creat 0.80; Hemoglobin 15.1; Platelets 180; Potassium 4.4; Sodium 136; TSH 1.32   Recent Lipid Panel    Component Value Date/Time   CHOL 116 05/10/2022 1105   TRIG 233 (H) 05/10/2022 1105   TRIG 193 (H) 08/08/2006 0924   HDL 61 05/10/2022 1105   CHOLHDL 1.9 05/10/2022 1105   VLDL 40 (H) 12/15/2016 0815   LDLCALC 26 05/10/2022 1105   LDLDIRECT 49.9 08/26/2013 1011         Physical Exam:    VS:  BP 114/68   Pulse 67   Ht 5\' 11"  (1.803 m)   Wt 193 lb 12.8 oz (87.9 kg)   SpO2 97%   BMI 27.03 kg/m     Wt Readings from Last 3 Encounters:  03/01/23 193 lb 12.8 oz (87.9 kg)  01/18/23 192 lb 9.6 oz (87.4 kg)  01/02/23 193 lb 3.2 oz (87.6 kg)     GEN:  Well nourished, well developed in no acute distress HEENT: Normal NECK: No JVD; No carotid bruits CARDIAC: RRR, 2/6 systolic murmur. No rubs or gallops RESPIRATORY:  Clear to auscultation without rales, wheezing or rhonchi  ABDOMEN: Soft, non-tender, non-distended MUSCULOSKELETAL:  Warm , no edema SKIN: Warm and dry NEUROLOGIC:  Alert and oriented x 3 PSYCHIATRIC:  Normal affect   ASSESSMENT:    1. Coronary artery disease of native artery of native heart with stable angina pectoris (HCC)   2. Essential hypertension   3. CAD in native artery   4. Ventricular tachycardia (HCC)   5. Mixed hyperlipidemia   6. Chronic combined systolic and diastolic CHF (congestive heart failure) (HCC)   7. Hyperlipidemia LDL goal <70   8. Positive cardiac stress test      PLAN:    In order of  problems listed above:  #CAD s/p CABG: Patient is s/p CABG with LIMA-LAD, SVG-OM, SVG to dRCA in 03/2019. Most recent cath in 08/2019 with patent grafts. Currently with progressive fatigue with high risk PET with inferolateral and inferior infarct with periinfarct ischemia and significant TID present. Will plan for cath. -Plan for cath -Continue ASA 81mg  daily -Continue crestor 40mg  daily -Continue coreg to 6.25mg  BID  #Chronic Combined Systolic and Diastolic HF: Patient with known history of combined systolic and diastolic HF. EF 2020 25-30% which improved to 45-50% in 2021 but dropped slightly to 40-45% in 07/2022. Currently, he is euvolemic and compensated on exam. GDMT limited due to hypotension.  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 Will continue with medical therapy.  #Bilateral Leg Pain: -ABI negative for obstructive disease  #VT s/p ablation: Underwent VT ablation and ICD implantation with Dr. Lalla Brothers. -Follow-up with EP as scheduled -Continue coreg 6.25mg  BID  #HTN: Blood pressure mainly 110s at home -Continue coreg 6.25mg  BID -Off losartan due to hypotension  #Weight Loss: GI work-up revealed esophageal stricture which was dilated but no evidence of malignancy on EGD or CT a/p. Currently appetite improved. Follows with Dr. Marina Goodell.  #Depression: -Continue citalopram 10mg  daily -Follow-up with PCP  INFORMED CONSENT: I have reviewed the risks, indications, and alternatives to cardiac catheterization, possible angioplasty, and stenting with the patient. Risks include but are not limited to bleeding, infection, vascular injury, stroke, myocardial infection, arrhythmia, kidney injury, radiation-related injury in the case of prolonged fluoroscopy use, emergency cardiac surgery, and death. The patient  understands the risks of serious complication is 1-2 in 1000 with diagnostic cardiac cath and 1-2% or less with angioplasty/stenting.         Medication Adjustments/Labs and Tests Ordered: Current medicines are reviewed at length with the patient today.  Concerns regarding medicines are outlined above.   Orders Placed This Encounter  Procedures   CBC   Basic metabolic panel   EKG 12-Lead   No orders of the defined types were placed in this encounter.  Patient Instructions  Medication Instructions:  Your physician recommends that you continue on your current medications as directed. Please refer to the Current Medication list given to you today.  *If you need a refill on your cardiac medications before your next appointment, please call your pharmacy*   Lab Work: CBC, BMET If you have labs (blood work) drawn today and your tests are completely normal, you will receive your results only by: MyChart Message (if you have MyChart) OR A paper copy in the mail If you have any lab test that is abnormal or we need to change your treatment, we will call you to review the results.   Testing/Procedures: Your physician has requested that you have a cardiac catheterization. Cardiac catheterization is used to diagnose and/or treat various heart conditions. Doctors may recommend this procedure for a number of different reasons. The most common reason is to evaluate chest pain. Chest pain can be a symptom of coronary artery disease (CAD), and cardiac catheterization can show whether plaque is narrowing or blocking your heart's arteries. This procedure is also used to evaluate the valves, as well as measure the blood flow and oxygen levels in different parts of your heart. For further information please visit [email or web address] Please follow instruction sheet, as given.    Follow-Up: At [facility], you and your health needs are our priority.  As part of our continuing mission to  provide you with exceptional heart care, we have created designated Provider Care Teams.  These Care Teams include your primary Cardiologist (physician) and Advanced Practice Providers (APPs -  Physician Assistants and Nurse Practitioners) who all work together to provide you with the care you need, when you need it.  We recommend signing up for the patient portal called "MyChart".  Sign up information is provided on this After Visit Summary.  MyChart is used to connect with patients for Virtual Visits (Telemedicine).  Patients are able to view lab/test results, encounter notes, upcoming appointments, etc.  Non-urgent messages can be sent to your provider as well.   To learn more about what you can do with MyChart, go  to [email or web address].    Your next appointment:   3 week(s)  Provider:   APP for cath follow up   Other Instructions  [facility] [place] [facility] A DEPT OF [place]. [facility] AT [facility] [address] [place], [place] [address] [id number] [facility] [place] [address] Dept: [phone] Loc: [phone]  ASEEM MERISIER  03/01/2023  You are scheduled for a Cardiac Catheterization on Wednesday, July 10 with Dr. Peter Swaziland.  1. Please arrive at the [facility] (Main Entrance A) at [facility]: [address] [place], [place] [address] at 11:30 AM (This time is 1.5 hour(s) before your procedure to ensure your preparation). Free valet parking service is available. You will check in at ADMITTING. The support person will be asked to wait in the waiting room.  It is OK to have someone drop you off and come back when you are ready to be discharged.    Special note: Every effort is made to have your procedure done on time. Please understand that emergencies sometimes delay scheduled procedures.  2. Diet: Do not eat solid foods after midnight.  The patient may have clear liquids until 5am upon the day of the procedure.  3. Labs: You will need to  have blood drawn on today  4. Medication instructions in preparation for your procedure:   Contrast Allergy: No   *For reference purposes while preparing patient instructions.   Delete this med list prior to printing instructions for patient.*  On the morning of your procedure, take your Aspirin 81 mg and any morning medicines NOT listed above.  You may use sips of water.  5. Plan to go home the same day, you will only stay overnight if medically necessary. 6. Bring a current list of your medications and current insurance cards. 7. You MUST have a responsible person to drive you home. 8. Someone MUST be with you the first 24 hours after you arrive home or your discharge will be delayed. 9. Please wear clothes that are easy to get on and off and wear slip-on shoes.  Thank you for allowing [place] to care for you!   -- [facility] Invasive Cardiovascular services     Signed, Meriam Sprague, MD  03/01/2023 2:03 PM    [facility]

## 2023-03-01 ENCOUNTER — Encounter: Payer: Self-pay | Admitting: Cardiology

## 2023-03-01 ENCOUNTER — Ambulatory Visit: Payer: Medicare HMO | Attending: Cardiology | Admitting: Cardiology

## 2023-03-01 VITALS — BP 114/68 | HR 67 | Ht 71.0 in | Wt 193.8 lb

## 2023-03-01 DIAGNOSIS — I25118 Atherosclerotic heart disease of native coronary artery with other forms of angina pectoris: Secondary | ICD-10-CM | POA: Diagnosis not present

## 2023-03-01 DIAGNOSIS — E782 Mixed hyperlipidemia: Secondary | ICD-10-CM | POA: Diagnosis not present

## 2023-03-01 DIAGNOSIS — I10 Essential (primary) hypertension: Secondary | ICD-10-CM

## 2023-03-01 DIAGNOSIS — I5042 Chronic combined systolic (congestive) and diastolic (congestive) heart failure: Secondary | ICD-10-CM | POA: Diagnosis not present

## 2023-03-01 DIAGNOSIS — I472 Ventricular tachycardia, unspecified: Secondary | ICD-10-CM

## 2023-03-01 DIAGNOSIS — I2510 Atherosclerotic heart disease of native coronary artery without angina pectoris: Secondary | ICD-10-CM | POA: Diagnosis not present

## 2023-03-01 DIAGNOSIS — R9439 Abnormal result of other cardiovascular function study: Secondary | ICD-10-CM | POA: Diagnosis not present

## 2023-03-01 DIAGNOSIS — E785 Hyperlipidemia, unspecified: Secondary | ICD-10-CM | POA: Diagnosis not present

## 2023-03-01 NOTE — Patient Instructions (Signed)
Medication Instructions:  Your physician recommends that you continue on your current medications as directed. Please refer to the Current Medication list given to you today.  *If you need a refill on your cardiac medications before your next appointment, please call your pharmacy*   Lab Work: CBC, BMET If you have labs (blood work) drawn today and your tests are completely normal, you will receive your results only by: MyChart Message (if you have MyChart) OR A paper copy in the mail If you have any lab test that is abnormal or we need to change your treatment, we will call you to review the results.   Testing/Procedures: Your physician has requested that you have a cardiac catheterization. Cardiac catheterization is used to diagnose and/or treat various heart conditions. Doctors may recommend this procedure for a number of different reasons. The most common reason is to evaluate chest pain. Chest pain can be a symptom of coronary artery disease (CAD), and cardiac catheterization can show whether plaque is narrowing or blocking your heart's arteries. This procedure is also used to evaluate the valves, as well as measure the blood flow and oxygen levels in different parts of your heart. For further information please visit [email or web address] Please follow instruction sheet, as given.    Follow-Up: At [facility], you and your health needs are our priority.  As part of our continuing mission to provide you with exceptional heart care, we have created designated Provider Care Teams.  These Care Teams include your primary Cardiologist (physician) and Advanced Practice Providers (APPs -  Physician Assistants and Nurse Practitioners) who all work together to provide you with the care you need, when you need it.  We recommend signing up for the patient portal called "MyChart".  Sign up information is provided on this After Visit Summary.  MyChart is used to connect with patients for  Virtual Visits (Telemedicine).  Patients are able to view lab/test results, encounter notes, upcoming appointments, etc.  Non-urgent messages can be sent to your provider as well.   To learn more about what you can do with MyChart, go to [email or web address].    Your next appointment:   3 week(s)  Provider:   APP for cath follow up   Other Instructions  [facility] A DEPT OF [place]. [facility] AT [facility] [address] [place], [place] [address] [id number] [facility] [place] [address] Dept: [phone] Loc: [phone]  Cameron Daniel  03/01/2023  You are scheduled for a Cardiac Catheterization on Wednesday, July 10 with Dr. Peter Swaziland.  1. Please arrive at the [facility] (Main Entrance A) at [facility]: [address] [place], [place] [address] at 11:30 AM (This time is 1.5 hour(s) before your procedure to ensure your preparation). Free valet parking service is available. You will check in at ADMITTING. The support person will be asked to wait in the waiting room.  It is OK to have someone drop you off and come back when you are ready to be discharged.    Special note: Every effort is made to have your procedure done on time. Please understand that emergencies sometimes delay scheduled procedures.  2. Diet: Do not eat solid foods after midnight.  The patient may have clear liquids until 5am upon the day of the procedure.  3. Labs: You will need to have blood drawn on today  4. Medication instructions in preparation for your procedure:   Contrast Allergy: No   *For reference purposes  while preparing patient instructions.   Delete this med list prior to printing instructions for patient.*  On the morning of your procedure, take your Aspirin 81 mg and any morning medicines NOT listed above.  You may use sips of water.  5. Plan to go home the same day, you will only stay overnight if medically necessary. 6. Bring a current  list of your medications and current insurance cards. 7. You MUST have a responsible person to drive you home. 8. Someone MUST be with you the first 24 hours after you arrive home or your discharge will be delayed. 9. Please wear clothes that are easy to get on and off and wear slip-on shoes.  Thank you for allowing [place] to care for you!   -- [place] Invasive Cardiovascular services

## 2023-03-02 LAB — BASIC METABOLIC PANEL
BUN/Creatinine Ratio: 13 (ref 10–24)
BUN: 11 mg/dL (ref 8–27)
CO2: 23 mmol/L (ref 20–29)
Calcium: 10.4 mg/dL — ABNORMAL HIGH (ref 8.6–10.2)
Chloride: 99 mmol/L (ref 96–106)
Creatinine, Ser: 0.83 mg/dL (ref 0.76–1.27)
Glucose: 123 mg/dL — ABNORMAL HIGH (ref 70–99)
Potassium: 4.3 mmol/L (ref 3.5–5.2)
Sodium: 137 mmol/L (ref 134–144)
eGFR: 91 mL/min/{1.73_m2} (ref >59)

## 2023-03-02 LAB — CBC
Hematocrit: 47.9 % (ref 37.5–51.0)
Hemoglobin: 15.4 g/dL (ref 13.0–17.7)
MCH: 28.2 pg (ref 26.6–33.0)
MCHC: 32.2 g/dL (ref 31.5–35.7)
MCV: 88 fL (ref 79–97)
Platelets: 124 10*3/uL — ABNORMAL LOW (ref 150–450)
RBC: 5.46 x10E6/uL (ref 4.14–5.80)
RDW: 14.4 % (ref 11.6–15.4)
WBC: 6.9 10*3/uL (ref 3.4–10.8)

## 2023-03-03 ENCOUNTER — Telehealth: Payer: Self-pay | Admitting: Cardiology

## 2023-03-03 NOTE — Telephone Encounter (Signed)
Spoke with patient and discussed lab results.  Patient also states he was calling to give Dr. Shari Prows his [facility] information that was discussed at his last office visit with her.  [facility] [address] [place], [place] [address]  Phone: [phone] Fax: [phone]  [facility]: Dr. Houston Siren

## 2023-03-03 NOTE — Telephone Encounter (Signed)
Called pt to figure out where information left is intended to go.  Voicemail box full unable to leave a voice message.

## 2023-03-03 NOTE — Telephone Encounter (Signed)
Patient says that the fax number and number was requested for [facility] Fax # [phone]  Number # [phone]

## 2023-03-07 ENCOUNTER — Telehealth: Payer: Self-pay | Admitting: *Deleted

## 2023-03-07 NOTE — Telephone Encounter (Signed)
Cardiac Catheterization scheduled at [facility] for: Monday July 10, 204 1 PM Arrival time [facility] Main Entrance A at: 11 AM   Nothing to eat after midnight prior to procedure, clear liquids until 5 AM day of procedure.  Medication instructions: -Hold:  Jardiance-AM of procedure  Januvia-pt reports he is not currently taking this -Other usual morning medications can be taken with sips of water including aspirin 81 mg.  Confirmed patient has responsible adult to drive home post procedure and be with patient first 24 hours after arriving home.  Plan to go home the same day, you will only stay overnight if medically necessary.  Reviewed procedure instructions with patient.

## 2023-03-08 ENCOUNTER — Encounter (HOSPITAL_COMMUNITY)
Admission: RE | Disposition: A | Discharge status: Home / Self Care | Payer: Self-pay | Source: Ambulatory Visit | Attending: Cardiovascular Disease

## 2023-03-08 ENCOUNTER — Ambulatory Visit (HOSPITAL_COMMUNITY)
Admission: RE | Admit: 2023-03-08 | Discharge: 2023-03-08 | Disposition: A | Discharge status: Home / Self Care | Payer: Medicare HMO | Source: Ambulatory Visit | Attending: Cardiovascular Disease | Admitting: Cardiovascular Disease

## 2023-03-08 ENCOUNTER — Other Ambulatory Visit: Payer: Self-pay

## 2023-03-08 DIAGNOSIS — Z79899 Other long term (current) drug therapy: Secondary | ICD-10-CM | POA: Insufficient documentation

## 2023-03-08 DIAGNOSIS — Z87891 Personal history of nicotine dependence: Secondary | ICD-10-CM | POA: Diagnosis not present

## 2023-03-08 DIAGNOSIS — M79605 Pain in left leg: Secondary | ICD-10-CM | POA: Diagnosis not present

## 2023-03-08 DIAGNOSIS — I255 Ischemic cardiomyopathy: Secondary | ICD-10-CM | POA: Diagnosis not present

## 2023-03-08 DIAGNOSIS — Z95810 Presence of automatic (implantable) cardiac defibrillator: Secondary | ICD-10-CM | POA: Diagnosis not present

## 2023-03-08 DIAGNOSIS — I472 Ventricular tachycardia, unspecified: Secondary | ICD-10-CM | POA: Insufficient documentation

## 2023-03-08 DIAGNOSIS — M79604 Pain in right leg: Secondary | ICD-10-CM | POA: Insufficient documentation

## 2023-03-08 DIAGNOSIS — I252 Old myocardial infarction: Secondary | ICD-10-CM | POA: Diagnosis not present

## 2023-03-08 DIAGNOSIS — I5022 Chronic systolic (congestive) heart failure: Secondary | ICD-10-CM | POA: Insufficient documentation

## 2023-03-08 DIAGNOSIS — I25118 Atherosclerotic heart disease of native coronary artery with other forms of angina pectoris: Secondary | ICD-10-CM | POA: Insufficient documentation

## 2023-03-08 DIAGNOSIS — I110 Hypertensive heart disease with heart failure: Secondary | ICD-10-CM | POA: Diagnosis not present

## 2023-03-08 DIAGNOSIS — Z7982 Long term (current) use of aspirin: Secondary | ICD-10-CM | POA: Insufficient documentation

## 2023-03-08 DIAGNOSIS — Z951 Presence of aortocoronary bypass graft: Secondary | ICD-10-CM | POA: Diagnosis not present

## 2023-03-08 DIAGNOSIS — E782 Mixed hyperlipidemia: Secondary | ICD-10-CM | POA: Insufficient documentation

## 2023-03-08 DIAGNOSIS — F32A Depression, unspecified: Secondary | ICD-10-CM | POA: Diagnosis not present

## 2023-03-08 HISTORY — PX: LEFT HEART CATH AND CORS/GRAFTS ANGIOGRAPHY: CATH118250

## 2023-03-08 LAB — GLUCOSE, CAPILLARY: Glucose-Capillary: 141 mg/dL — ABNORMAL HIGH (ref 70–99)

## 2023-03-08 SURGERY — LEFT HEART CATH AND CORS/GRAFTS ANGIOGRAPHY
Anesthesia: LOCAL

## 2023-03-08 MED ORDER — IOHEXOL 350 MG/ML SOLN
INTRAVENOUS | Status: DC | PRN
  Administered 2023-03-08: 75 mL

## 2023-03-08 MED ORDER — FENTANYL CITRATE (PF) 100 MCG/2ML IJ SOLN
INTRAMUSCULAR | Status: DC | PRN
  Administered 2023-03-08: 25 ug via INTRAVENOUS

## 2023-03-08 MED ORDER — SODIUM CHLORIDE 0.9% FLUSH: 3.0000 mL | Freq: Two times a day (BID) | INTRAVENOUS | Status: DC

## 2023-03-08 MED ORDER — SODIUM CHLORIDE 0.9% FLUSH: 3.0000 mL | INTRAVENOUS | Status: DC | PRN

## 2023-03-08 MED ORDER — SODIUM CHLORIDE 0.9 % IV SOLN: 250.0000 mL | INTRAVENOUS | Status: DC | PRN

## 2023-03-08 MED ORDER — HEPARIN SODIUM (PORCINE) 1000 UNIT/ML IJ SOLN
INTRAMUSCULAR | Status: AC
  Filled 2023-03-08: qty 10

## 2023-03-08 MED ORDER — SODIUM CHLORIDE 0.9 % WEIGHT BASED INFUSION: 1.0000 mL/kg/h | INTRAVENOUS | Status: DC

## 2023-03-08 MED ORDER — LIDOCAINE HCL (PF) 1 % IJ SOLN
INTRAMUSCULAR | Status: AC
  Filled 2023-03-08: qty 30

## 2023-03-08 MED ORDER — MIDAZOLAM HCL 2 MG/2ML IJ SOLN
INTRAMUSCULAR | Status: DC | PRN
  Administered 2023-03-08: 1 mg via INTRAVENOUS

## 2023-03-08 MED ORDER — MIDAZOLAM HCL 2 MG/2ML IJ SOLN
INTRAMUSCULAR | Status: AC
  Filled 2023-03-08: qty 2

## 2023-03-08 MED ORDER — HEPARIN (PORCINE) IN NACL 1000-0.9 UT/500ML-% IV SOLN
INTRAVENOUS | Status: DC | PRN
  Administered 2023-03-08 (×2): 500 mL

## 2023-03-08 MED ORDER — VERAPAMIL HCL 2.5 MG/ML IV SOLN
INTRAVENOUS | Status: DC | PRN
  Administered 2023-03-08: 10 mL via INTRA_ARTERIAL

## 2023-03-08 MED ORDER — SODIUM CHLORIDE 0.9 % WEIGHT BASED INFUSION
3.0000 mL/kg/h | INTRAVENOUS | Status: AC
  Administered 2023-03-08: 3 mL/kg/h via INTRAVENOUS

## 2023-03-08 MED ORDER — VERAPAMIL HCL 2.5 MG/ML IV SOLN
INTRAVENOUS | Status: AC
  Filled 2023-03-08: qty 2

## 2023-03-08 MED ORDER — ACETAMINOPHEN 325 MG PO TABS: 650.0000 mg | ORAL_TABLET | ORAL | Status: DC | PRN

## 2023-03-08 MED ORDER — ASPIRIN 81 MG PO CHEW: 81.0000 mg | CHEWABLE_TABLET | ORAL | Status: DC

## 2023-03-08 MED ORDER — ONDANSETRON HCL 4 MG/2ML IJ SOLN: 4.0000 mg | Freq: Four times a day (QID) | INTRAMUSCULAR | Status: DC | PRN

## 2023-03-08 MED ORDER — HEPARIN SODIUM (PORCINE) 1000 UNIT/ML IJ SOLN
INTRAMUSCULAR | Status: DC | PRN
  Administered 2023-03-08: 4500 [IU] via INTRAVENOUS

## 2023-03-08 MED ORDER — FENTANYL CITRATE (PF) 100 MCG/2ML IJ SOLN
INTRAMUSCULAR | Status: AC
  Filled 2023-03-08: qty 2

## 2023-03-08 SURGICAL SUPPLY — 14 items
CATH EXPO 5F MPA-1 (CATHETERS) IMPLANT
CATH INFINITI 5 FR IM (CATHETERS) IMPLANT
CATH INFINITI AMBI 5FR TG (CATHETERS) IMPLANT
DEVICE RAD COMP TR BAND LRG (VASCULAR PRODUCTS) IMPLANT
GLIDESHEATH SLEND SS 6F .021 (SHEATH) IMPLANT
GUIDEWIRE INQWIRE 1.5J.035X260 (WIRE) IMPLANT
INQWIRE 1.5J .035X260CM (WIRE) ×1
KIT HEART LEFT (KITS) ×1 IMPLANT
KIT MICROPUNCTURE NIT STIFF (SHEATH) IMPLANT
PACK CARDIAC CATHETERIZATION (CUSTOM PROCEDURE TRAY) ×1 IMPLANT
SHEATH PINNACLE 5F 10CM (SHEATH) IMPLANT
TRANSDUCER W/STOPCOCK (MISCELLANEOUS) ×1 IMPLANT
TUBING CIL FLEX 10 FLL-RA (TUBING) ×1 IMPLANT
WIRE EMERALD 3MM-J .035X150CM (WIRE) IMPLANT

## 2023-03-08 NOTE — Interval H&P Note (Signed)
History and Physical Interval Note:  03/08/2023 8:40 AM  Cameron Daniel  has presented today for surgery, with the diagnosis of known CAD.  The various methods of treatment have been discussed with the patient and family. After consideration of risks, benefits and other options for treatment, the patient has consented to  Procedure(s): LEFT HEART CATH AND CORS/GRAFTS ANGIOGRAPHY (N/A) as a surgical intervention.  The patient's history has been reviewed, patient examined, no change in status, stable for surgery.  I have reviewed the patient's chart and labs.  Questions were answered to the patient's satisfaction.     Lorine Bears

## 2023-03-08 NOTE — Discharge Instructions (Signed)
Radial Site Care  This sheet gives you information about how to care for yourself after your procedure. Your health care provider may also give you more specific instructions. If you have problems or questions, contact your health care provider. What can I expect after the procedure? After the procedure, it is common to have: Bruising and tenderness at the catheter insertion area. Follow these instructions at home: Medicines Take over-the-counter and prescription medicines only as told by your health care provider. Insertion site care Follow instructions from your health care provider about how to take care of your insertion site. Make sure you: Wash your hands with soap and water before you remove your bandage (dressing). If soap and water are not available, use hand sanitizer. May remove dressing in 24 hours. Check your insertion site every day for signs of infection. Check for: Redness, swelling, or pain. Fluid or blood. Pus or a bad smell. Warmth. Do no take baths, swim, or use a hot tub for 5 days. You may shower 24-48 hours after the procedure. Remove the dressing and gently wash the site with plain soap and water. Pat the area dry with a clean towel. Do not rub the site. That could cause bleeding. Do not apply powder or lotion to the site. Activity  For 24 hours after the procedure, or as directed by your health care provider: Do not flex or bend the affected arm. Do not push or pull heavy objects with the affected arm. Do not drive yourself home from the hospital or clinic. You may drive 24 hours after the procedure. Do not operate machinery or power tools. KEEP ARM ELEVATED THE REMAINDER OF THE DAY. Do not push, pull or lift anything that is heavier than 10 lb for 5 days. Ask your health care provider when it is okay to: Return to work or school. Resume usual physical activities or sports. Resume sexual activity. General instructions If the catheter site starts to  bleed, raise your arm and put firm pressure on the site. If the bleeding does not stop, get help right away. This is a medical emergency. DRINK PLENTY OF FLUIDS FOR THE NEXT 2-3 DAYS. No alcohol consumption for 24 hours after receiving sedation. If you went home on the same day as your procedure, a responsible adult should be with you for the first 24 hours after you arrive home. Keep all follow-up visits as told by your health care provider. This is important. Contact a health care provider if: You have a fever. You have redness, swelling, or yellow drainage around your insertion site. Get help right away if: You have unusual pain at the radial site. The catheter insertion area swells very fast. The insertion area is bleeding, and the bleeding does not stop when you hold steady pressure on the area. Your arm or hand becomes pale, cool, tingly, or numb. These symptoms may represent a serious problem that is an emergency. Do not wait to see if the symptoms will go away. Get medical help right away. Call your local emergency services (911 in the [place] Do not drive yourself to the hospital. Summary After the procedure, it is common to have bruising and tenderness at the site. Follow instructions from your health care provider about how to take care of your radial site wound. Check the wound every day for signs of infection.  This information is not intended to replace advice given to you by your health care provider. Make sure you discuss any questions you have with   your health care provider. Document Revised: 09/20/2017 Document Reviewed: 09/20/2017 Elsevier Patient Education  2020 Elsevier Inc.  

## 2023-03-09 ENCOUNTER — Encounter (HOSPITAL_COMMUNITY): Payer: Self-pay | Admitting: Cardiovascular Disease

## 2023-03-09 ENCOUNTER — Telehealth: Payer: Self-pay | Admitting: Cardiology

## 2023-03-09 NOTE — Telephone Encounter (Signed)
Pt had cath done on 03/08/23.  Pt is calling asking for medical records to fax his cath report from yesterday 7/10 to the [place] for their records.  Pt provided [facility] fax information in this encounter.  Will send this request to our [facility] HIM POOL to further assist the pt with getting this record (cath report) to the [place].

## 2023-03-09 NOTE — Telephone Encounter (Signed)
[facility] states that the [facility] needs a [facility] sent over for the procedure that was done yesterday on 03/08/2023. Their fax number is [phone].

## 2023-03-10 NOTE — Telephone Encounter (Signed)
Rodgerick, Isaak" - 03/09/2023  1:57 PM Gwenette Greet  Sent: Caleen Essex March 10, 2023  4:21 PM  To: Loa Socks, LPN; P [facility] Him Pool         Message  Will call patient need more info.

## 2023-03-15 ENCOUNTER — Telehealth: Payer: Self-pay | Admitting: Cardiology

## 2023-03-15 NOTE — Telephone Encounter (Signed)
Caller checking for catherization results.

## 2023-03-15 NOTE — Telephone Encounter (Signed)
Received stat call from [facility]-medical records. Pt contacted medical records requesting the procedure notes from his cardiac craterization released to another provider. However, Marylu Lund did not see the documentation from the procedure. Advise Marylu Lund the procedure notes are under the procedure tab. She will contact assistance from someone on her team. Donna Christen, if she still need assistance to call the office.

## 2023-03-17 ENCOUNTER — Other Ambulatory Visit: Payer: Self-pay | Admitting: Family Medicine

## 2023-03-17 NOTE — Telephone Encounter (Signed)
Requested Prescriptions  Pending Prescriptions Disp Refills   finasteride (PROSCAR) 5 MG tablet [Pharmacy Med Name: FINASTERIDE 5 MG TABLET] 90 tablet 3    Sig: TAKE 1 TABLET BY MOUTH EVERY DAY     Urology: 5-alpha Reductase Inhibitors Failed - 03/17/2023  6:16 AM      Failed - Valid encounter within last 12 months    Recent Outpatient Visits           1 year ago Right groin pain   [facility] Donita Brooks, MD   1 year ago Type 2 diabetes mellitus with hyperlipidemia (HCC)   [facility] Pickard, Priscille Heidelberg, MD   1 year ago Colon cancer screening   [facility] Donita Brooks, MD   1 year ago Hypercalcemia   [facility] Donita Brooks, MD   2 years ago Seasonal allergies   [facility] Pickard, Priscille Heidelberg, MD       Future Appointments             In 1 week Sharlene Dory, PA-C [facility]   In 1 month O'Neal, Ronnald Ramp, MD [facility] - PSA in normal range and within 360 days    PSA  Date Value Ref Range Status  12/05/2022 0.34 < OR = 4.00 ng/mL Final    Comment:    The total PSA value from this assay system is  standardized against the WHO standard. The test  result will be approximately 20% lower when compared  to the equimolar-standardized total PSA (Beckman  Coulter). Comparison of serial PSA results should be  interpreted with this fact in mind. . This test was performed using the Siemens  chemiluminescent method. Values obtained from  different assay methods cannot be used interchangeably. PSA levels, regardless of value, should not be interpreted as absolute evidence of the presence or absence of disease.

## 2023-03-21 DIAGNOSIS — E119 Type 2 diabetes mellitus without complications: Secondary | ICD-10-CM | POA: Diagnosis not present

## 2023-03-23 ENCOUNTER — Other Ambulatory Visit: Payer: Self-pay

## 2023-03-23 ENCOUNTER — Ambulatory Visit
Admission: EM | Admit: 2023-03-23 | Discharge: 2023-03-23 | Disposition: A | Discharge status: Home / Self Care | Payer: Medicare HMO | Attending: Emergency Medicine | Admitting: Emergency Medicine

## 2023-03-23 ENCOUNTER — Ambulatory Visit (INDEPENDENT_AMBULATORY_CARE_PROVIDER_SITE_OTHER): Payer: Medicare HMO

## 2023-03-23 DIAGNOSIS — I472 Ventricular tachycardia, unspecified: Secondary | ICD-10-CM

## 2023-03-23 DIAGNOSIS — R001 Bradycardia, unspecified: Secondary | ICD-10-CM

## 2023-03-23 LAB — CUP PACEART REMOTE DEVICE CHECK
Battery Remaining Longevity: 118 mo
Battery Voltage: 3.01 V
Brady Statistic RV Percent Paced: 0.01 %
Date Time Interrogation Session: 20240725012404
HighPow Impedance: 71 Ohm
Implantable Lead Connection Status: 753985
Implantable Lead Implant Date: 20211028
Implantable Lead Location: 753860
Implantable Pulse Generator Implant Date: 20211028
Lead Channel Impedance Value: 399 Ohm
Lead Channel Impedance Value: 494 Ohm
Lead Channel Pacing Threshold Amplitude: 0.625 V
Lead Channel Pacing Threshold Pulse Width: 0.4 ms
Lead Channel Sensing Intrinsic Amplitude: 10.375 mV
Lead Channel Sensing Intrinsic Amplitude: 10.375 mV
Lead Channel Setting Pacing Amplitude: 2 V
Lead Channel Setting Pacing Pulse Width: 0.4 ms
Lead Channel Setting Sensing Sensitivity: 0.3 mV
Zone Setting Status: 755011
Zone Setting Status: 755011

## 2023-03-23 NOTE — ED Notes (Signed)
Patient is being discharged from the Urgent Care and sent to the Emergency Department via PMV . Per provider Danella Maiers., patient is in need of higher level of care due to run of Vtach today and bradycardia today. Patient is aware and verbalizes understanding of plan of care.  Vitals:   03/23/23 1911  BP: 111/70  Pulse: 72  Resp: 18  Temp: 97.9 F (36.6 C)  SpO2: 96%

## 2023-03-23 NOTE — ED Triage Notes (Signed)
Pt states he felt a little off this morning and took his heart rate and BP and noted it to be 49 this morning. Pt states he is feeling better now. HR is 72.

## 2023-03-23 NOTE — ED Provider Notes (Signed)
HPI  SUBJECTIVE:  Cameron Daniel is a 77 y.o. male who presents with 2 episodes of heart rate in the 50s earlier today.  He states his heart rate normally runs in the mid 60s to 70s.  He checked his blood pressure during this time and states that it was normal.  He states that he felt a flutter this morning in his throat that lasted approximately 2 to 3 seconds and then resolved.  He has never had this before.  He states that he felt "different" earlier today that lasted for several hours, but has completely resolved.  He denies chest pain, pressure, heaviness, shortness of breath, nausea, vomiting, diaphoresis, lightheadedness, presyncope, syncope.  No fatigue.  No recent change in his medications.  He just started taking a prostate supplement called super beta prostate.  No aggravating or alleviating factors.  He has not tried anything for this.  He has an extensive cardiac history including VT with a defibrillator, MI, coronary artery disease status post CABG x 4, NSTEMI, diabetes, hypertension, chronic kidney disease, hypercholesterolemia, CHF.  PCP: [facility].  Patient states that he has an appointment with his cardiologist at [facility] tomorrow morning.   Past Medical History:  Diagnosis Date   Allergic rhinitis, cause unspecified    Allergy    Anxiety    Arthritis    Cancer (HCC)    a couple places removed from eyelid   Carotid Doppler 04/2020   Carotid US 9/21: No evidence of ICA stenosis bilaterally; right vertebral artery stenosis   Cataract    Chronic combined systolic (congestive) and diastolic (congestive) heart failure (HCC)    Coronary atherosclerosis of unspecified type of vessel, native or graft    a. MI 1985 with unclear details. b. CABG 03/2019 with LIMA -> LAD, SVG -> OM, SVG to dRCA.   Depression    PTSD    Diverticulosis of colon (without mention of hemorrhage)    Esophageal reflux    Esophageal stricture    Essential hypertension    Former tobacco use     Hemorrhoids    Hyperlipidemia    ICD (implantable cardioverter-defibrillator) in place    Irritable bowel syndrome    Ischemic cardiomyopathy    Mild carotid artery disease (HCC)    a. 1-39% bilaterally 03/2019 duplex.   Myocardial infarction (HCC)    Nocturia    Obesity, unspecified    Orthostasis    Other acquired absence of organ    Personal history of colonic polyps    Psychosexual dysfunction with inhibited sexual excitement    Retroperitoneal bleed    Type II or unspecified type diabetes mellitus without mention of complication, not stated as uncontrolled    VT (ventricular tachycardia) (HCC)     Past Surgical History:  Procedure Laterality Date   ADENOIDECTOMY     CARPAL TUNNEL RELEASE     left    CHOLECYSTECTOMY     COLON RESECTION     18 inches   COLON SURGERY  2013   CORONARY ANGIOPLASTY     1985    CORONARY ARTERY BYPASS GRAFT N/A 04/26/2019   Procedure: CORONARY ARTERY BYPASS GRAFTING (CABG) x Three, using left internal mammary artery and right leg greater saphenous vein harvested endoscopically;  Surgeon: Alleen Borne, MD;  Location: [facility] OR;  Service: Open Heart Surgery;  Laterality: N/A;   ELBOW BURSA SURGERY     EYE SURGERY     ICD IMPLANT N/A 06/25/2020   Procedure: ICD IMPLANT;  Surgeon: Lanier Prude, MD;  Location: [facility] INVASIVE CV LAB;  Service: Cardiovascular;  Laterality: N/A;   LEFT HEART CATH AND CORS/GRAFTS ANGIOGRAPHY N/A 06/19/2020   Procedure: LEFT HEART CATH AND CORS/GRAFTS ANGIOGRAPHY;  Surgeon: Lennette Bihari, MD;  Location: [facility] INVASIVE CV LAB;  Service: Cardiovascular;  Laterality: N/A;   LEFT HEART CATH AND CORS/GRAFTS ANGIOGRAPHY N/A 03/08/2023   Procedure: LEFT HEART CATH AND CORS/GRAFTS ANGIOGRAPHY;  Surgeon: Iran Ouch, MD;  Location: [facility] INVASIVE CV LAB;  Service: Cardiovascular;  Laterality: N/A;   POLYPECTOMY     RIGHT/LEFT HEART CATH AND CORONARY ANGIOGRAPHY N/A 04/16/2019   Procedure: RIGHT/LEFT HEART CATH AND CORONARY  ANGIOGRAPHY;  Surgeon: Lyn Records, MD;  Location: [facility] INVASIVE CV LAB;  Service: Cardiovascular;  Laterality: N/A;   SHOULDER SURGERY     Rt. Shoulder   TEE WITHOUT CARDIOVERSION N/A 04/26/2019   Procedure: TRANSESOPHAGEAL ECHOCARDIOGRAM (TEE);  Surgeon: Alleen Borne, MD;  Location: [facility] OR;  Service: Open Heart Surgery;  Laterality: N/A;   TONSILLECTOMY     V TACH ABLATION N/A 06/22/2020   Procedure: V TACH ABLATION;  Surgeon: Lanier Prude, MD;  Location: [facility] INVASIVE CV LAB;  Service: Cardiovascular;  Laterality: N/A;    Family History  Problem Relation Age of Onset   Lung cancer Father    Allergic rhinitis Father    Coronary artery disease Mother    Colon cancer Neg Hx    Heart attack Neg Hx    Hypertension Neg Hx    Stroke Neg Hx    Esophageal cancer Neg Hx    Rectal cancer Neg Hx    Stomach cancer Neg Hx    Pancreatic cancer Neg Hx     Social History   Tobacco Use   Smoking status: Former    Current packs/day: 0.00    Types: Cigarettes    Quit date: 08/30/1983    Years since quitting: 39.5   Smokeless tobacco: Former   Tobacco comments:    Quit 30 years ago.  Vaping Use   Vaping status: Never Used  Substance Use Topics   Alcohol use: Yes    Comment: rare   Drug use: No    No current facility-administered medications for this encounter.  Current Outpatient Medications:    acetaminophen (TYLENOL) 500 MG tablet, Take 1,000 mg by mouth at bedtime as needed for mild pain., Disp: , Rfl:    aspirin EC 81 MG tablet, Take 1 tablet (81 mg total) by mouth daily., Disp: 90 tablet, Rfl: 3   azelastine (ASTELIN) 0.1 % nasal spray, Place 2 sprays into both nostrils 2 (two) times daily. Use in each nostril as directed (Patient taking differently: Place 2 sprays into both nostrils daily as needed for allergies or rhinitis. Use in each nostril as directed), Disp: 30 mL, Rfl: 12   carvedilol (COREG) 6.25 MG tablet, Take 1 tablet (6.25 mg total) by mouth 2 (two) times daily.,  Disp: 180 tablet, Rfl: 2   Cetirizine HCl (ZERVIATE) 0.24 % SOLN, Apply 1 drop to eye 2 (two) times daily., Disp: 30 each, Rfl: 3   cholestyramine (QUESTRAN) 4 g packet, Take 0.5 packets (2 g total) by mouth 2 (two) times daily., Disp: 60 each, Rfl: 3   citalopram (CELEXA) 10 MG tablet, Take 1 tablet (10 mg total) by mouth daily., Disp: 90 tablet, Rfl: 1   empagliflozin (JARDIANCE) 25 MG TABS tablet, TAKE 25 MG (1 TABLET) BY MOUTH DAILY BEFORE BREAKFAST. STOP PIOGLITAZONE, Disp:  90 tablet, Rfl: 1   fexofenadine (ALLEGRA) 180 MG tablet, Take 1 tablet (180 mg total) by mouth daily as needed. For allergies (Patient taking differently: Take 180 mg by mouth daily. For allergies), Disp: 90 tablet, Rfl: 0   finasteride (PROSCAR) 5 MG tablet, TAKE 1 TABLET BY MOUTH EVERY DAY, Disp: 90 tablet, Rfl: 3   montelukast (SINGULAIR) 10 MG tablet, TAKE 1 TABLET BY MOUTH EVERYDAY AT BEDTIME, Disp: 90 tablet, Rfl: 3   olopatadine (PATADAY) 0.1 % ophthalmic solution, Place 1 drop into both eyes daily as needed for allergies., Disp: , Rfl:    pantoprazole (PROTONIX) 40 MG tablet, Take 1 tablet (40 mg total) by mouth 2 (two) times daily., Disp: 60 tablet, Rfl: 11   rosuvastatin (CRESTOR) 40 MG tablet, TAKE 1 TABLET BY MOUTH DAILY AT 6 PM., Disp: 90 tablet, Rfl: 3   sitaGLIPtin (JANUVIA) 50 MG tablet, Take 1 tablet (50 mg total) by mouth daily., Disp: 90 tablet, Rfl: 3   vitamin B-12 (CYANOCOBALAMIN) 100 MCG tablet, Take 100 mcg by mouth daily., Disp: , Rfl:    diphenoxylate-atropine (LOMOTIL) 2.5-0.025 MG tablet, Take 2 tablets by mouth 4 (four) times daily as needed for diarrhea or loose stools., Disp: 30 tablet, Rfl: 0   hydrocortisone (ANUSOL-HC) 2.5 % rectal cream, Place 1 Application rectally at bedtime. (Patient taking differently: Place 1 Application rectally daily as needed for hemorrhoids or anal itching.), Disp: 30 g, Rfl: 1   nystatin (MYCOSTATIN) 100000 UNIT/ML suspension, Take 5 mLs (500,000 Units total) by  mouth 4 (four) times daily. (Patient not taking: Reported on 03/07/2023), Disp: 60 mL, Rfl: 0  Allergies  Allergen Reactions   Doxazosin Mesylate Other (See Comments)    Drops in blood pressure    Losartan     Other reaction(s): Muscle pain   Testosterone     Other reaction(s): Erythrocytosis Other reaction(s): Erythrocytosis   Flomax [Tamsulosin Hcl] Itching and Other (See Comments)    Drops in blood pressure     Penicillins Rash and Other (See Comments)    Has patient had a PCN reaction causing immediate rash, facial/tongue/throat swelling, SOB or lightheadedness with hypotension: No Has patient had a PCN reaction causing severe rash involving mucus membranes or skin necrosis: No Has patient had a PCN reaction that required hospitalization No Has patient had a PCN reaction occurring within the last 10 years: No If all of the above answers are "NO", then may proceed with Cephalosporin use.    Sulfonamide Derivatives Rash   Trulicity [Dulaglutide] Nausea And Vomiting    N&V, Dizziness     ROS  As noted in HPI.   Physical Exam  BP 111/70 (BP Location: Right Arm)   Pulse 72   Temp 97.9 F (36.6 C) (Oral)   Resp 18   SpO2 96%   Constitutional: Well developed, well nourished, no acute distress Eyes:  EOMI, conjunctiva normal bilaterally HENT: Normocephalic, atraumatic,mucus membranes moist Respiratory: Normal inspiratory effort, lungs clear bilaterally Cardiovascular: Normal rate, regular rhythm, positive murmur GI: nondistended skin: No rash, skin intact Musculoskeletal: no deformities Neurologic: Alert & oriented x 3, no focal neuro deficits Psychiatric: Speech and behavior appropriate   ED Course   Medications - No data to display  Orders Placed This Encounter  Procedures   ED EKG    Bradycardia this am    Standing Status:   Standing    Number of Occurrences:   1    Order Specific Question:   Reason for Exam  Answer:   Other (See Comments)    No  results found for this or any previous visit (from the past 24 hour(s)). CUP PACEART REMOTE DEVICE CHECK  Result Date: 03/23/2023 Scheduled remote reviewed. Normal device function.  There was one VT arrhythmia that fell into the VT monitor only zone and lasted for 30 seconds, sent to triage There was one short NSVT arrhythmia detected Next remote 91 days. Hassell Halim, RN, CCDS, [employer]   ED Clinical Impression  1. Bradycardia      ED Assessment/Plan     EKG: Sinus rhythm with premature supraventricular complexes.  Rate 68.  Left axis deviation.  Right bundle branch block.  T wave inversion in V1 and V2 present in previous EKG from 7/24.  No ST elevation.  No other EKG changes.  Patient asymptomatic while EKG was obtained.  Looks like patient had 30 second run of ventricular tachycardia earlier today per remote pacer.  I suspect that is when the patient states he was "feeling different" with a flutter this morning that lasted 2 to 3 seconds.  His EKG is unchanged here.  He has absolutely no symptoms.  Transferring to the emergency department to evaluate for another NSTEMI.  He is stable to go via private vehicle.  Patient states that he does not want to go to the emergency department.  He states that he will go to [facility] via EMS if his symptoms recur.  Discussed that I was concerned about him, and the possibility of death with patient. He still refuses to go. He understands that he is signing out AMA.    No orders of the defined types were placed in this encounter.     *This clinic note was created using Dragon dictation software. Therefore, there may be occasional mistakes despite careful proofreading.  ?    Domenick Gong, MD 03/23/23 2057

## 2023-03-23 NOTE — Discharge Instructions (Addendum)
I am concerned because you had another run of ventricular tachycardia, which is a very dangerous arrhythmia.  While your vitals are normal here and you appear stable, I want to make sure that you have not had another heart attack.  Please go immediately to the emergency department.  If you choose not to go, call 911 if your symptoms recur, or for any concerns.  The patient and/or responsible adult understands that the hospital has offered any or all of the following: o To examine the patient to determine whether or not an emergency medical condition is present. o To provide stabilizing medical treatment for an emergency condition. o To provide a medically appropriate transfer to another healthcare facility. o To administer ongoing (inpatient) medical care.  The hospital personnel and/or physician have informed the patient of the benefits that might reasonably be expected from the offered services, the risks of refusing the offered services, and the risks of the offered services.

## 2023-03-23 NOTE — ED Notes (Signed)
Pt would not like to go to the ER at this time even though it is advised per the medical provider. Pt has decided to leave AMA. Provider is aware of this and spoke with pt but he is still wanting to leave.

## 2023-03-24 ENCOUNTER — Ambulatory Visit: Payer: Medicare HMO | Admitting: Physician Assistant

## 2023-03-24 ENCOUNTER — Encounter: Payer: Self-pay | Admitting: Physician Assistant

## 2023-03-24 VITALS — BP 116/56 | HR 73 | Ht 71.0 in | Wt 192.6 lb

## 2023-03-24 DIAGNOSIS — I2510 Atherosclerotic heart disease of native coronary artery without angina pectoris: Secondary | ICD-10-CM

## 2023-03-24 DIAGNOSIS — R0609 Other forms of dyspnea: Secondary | ICD-10-CM | POA: Diagnosis not present

## 2023-03-24 DIAGNOSIS — I5042 Chronic combined systolic (congestive) and diastolic (congestive) heart failure: Secondary | ICD-10-CM | POA: Diagnosis not present

## 2023-03-24 DIAGNOSIS — I10 Essential (primary) hypertension: Secondary | ICD-10-CM | POA: Diagnosis not present

## 2023-03-24 DIAGNOSIS — I472 Ventricular tachycardia, unspecified: Secondary | ICD-10-CM | POA: Diagnosis not present

## 2023-03-24 MED ORDER — EMPAGLIFLOZIN 25 MG PO TABS: ORAL_TABLET | ORAL | 0 refills | Status: AC

## 2023-03-24 NOTE — Patient Instructions (Signed)
Medication Instructions:  No changes *If you need a refill on your cardiac medications before your next appointment, please call your pharmacy*   Lab Work: none   Testing/Procedures: Your physician has requested that you have an echocardiogram. Echocardiography is a painless test that uses sound waves to create images of your heart. It provides your doctor with information about the size and shape of your heart and how well your heart's chambers and valves are working. This procedure takes approximately one hour. There are no restrictions for this procedure. Please do NOT wear cologne, perfume, aftershave, or lotions (deodorant is allowed). Please arrive 15 minutes prior to your appointment time.   Follow-Up: As planned

## 2023-03-24 NOTE — Progress Notes (Signed)
Cardiology Office Note:  .   Date:  03/24/2023  ID:  FREMON OHLMAN, DOB 1946-01-03, MRN [id number] PCP: Donita Brooks, MD  [facility] Providers Cardiologist:  Meriam Sprague, MD   Electrophysiologist:  Lanier Prude, MD  Advanced Heart Failure:  Arvilla Meres, MD {  History of Present Illness: AXEL LUIKART is a 77 y.o. male with past medical history of CAD status post CABG with LIMA to LAD, SVG to OM, SVG to dRCA, systolic HF with LVEF 25 to 30%, VT status post ablation 10/21 with ICD implantation, HTN, and HLD who was previously followed by Dr. Shari Prows presents for follow-up appointment.  Patient's history includes MI in 1985 with unclear intervention, treated by Dr. Riley Kill at that time.  Aortoiliac duplex 2014 showed no AAA.  Did well for many years before presenting back to the ED August 2020 with fever and malaise.  Seen in the ER given fluids and discharged but returned back with respiratory failure requiring intubation for PNA and CHF and NSTEMI.  2D echo 04/13/2019 showed LVEF 25 to 30%, severe hypokinesis of the left ventricle, entire inferior wall and anterior septal wall, mild LAE, small pericardial effusion.  COVID testing was negative.  Cardiac cath showed 3V CAD with low output.  Started on milrinone post cath.  Developed large spontaneous RP bleed post cath (cath done all from the arm).  Ultimately underwent CABG 04/26/2019.  Preop TEE with LVEF 45 to 50%, post CABG was uneventful.  Thanks spironolactone was later stopped due to hyperkalemia, olmesartan dropped to due to hypotension and follow-up with CHF team for period of time after graduating from their services.  He was tolerant of losartan.  05/2020 was admitted with persistent VT.  Initially treated with amiodarone but ultimately converted in ED.  LHC showed patent grafts without specific culprit.  Was seen by EP and underwent EPS/ablation with scar modification and ICD implantation.  2D echo  10/21 showed LVEF 45 to 50%, mild LVH, grade 2 DD, moderate LAE, normal RV.  Was seen in November 2023 and at that time was feeling more fatigued and she attributed to his Coreg.  Decrease dosing to see if would help with symptoms.  TTE 12/23 with LVEF 40 to 45%, global hypokinesis, grade 3 DD, severe RV dysfunction with moderate RV enlargement and mild MR.  Was seen January 2024 where he lost some weight was struggling with GI issues and was planning to see GI.  Otherwise, stable from CV standpoint.  Upper endoscopy revealed esophageal stricture which was dilated.  Also benign fundic gland polyps.  He was continued on PPI.  CT of the abdomen pelvis with no acute pathology.  Seen May 2024 was having increased fatigue with exertion.  NM PET showed a large, severe defect in the basilar to apical inferior and inferolateral locations that was particularly reversible.  Rest EF 25%, stress EF 35%.  MBF are 2 but overall low.  3 times daily also present.  Seen March 01, 2023 and overall was feeling okay at that time.  Stated he was recovered from a UTI and fatigue improved.  No shortness of breath or chest pain at that time.  No orthopnea or PND.  Discussed results of stress test at length.  Given the presence of significant 3 times daily and region of ischemia, cardiac catheterization was arranged.  Cardiac cath listed below, medical management was recommended.  Today, he tells me he went to urgent care for  a slow HR. EKG showed RBBB which is nothing new.  It also showed premature supraventricular complexes which may have been the culprit for "fluttering" in his chest that he felt yesterday morning.  This was the first time he felt any fluttering.  No further episodes.  Heart rate in 70s today.  Thinks his watch might have been faulty when it was reading a heart rate in the 50s.  We did not make any medication changes today.  I did offer cardiac monitor but patient deferred at this time.  We reviewed his last  echocardiogram from December 2023.  He is euvolemic on exam.  Reports no shortness of breath nor dyspnea on exertion. Reports no chest pain, pressure, or tightness. No edema, orthopnea, PND.   ROS: Pertinent ROS in HPI.  Studies Reviewed: .       Cardiac catheterization 03/08/2023 Left Main  Mid LM to Dist LM lesion is 50% stenosed.    Left Anterior Descending  Ost LAD lesion is 80% stenosed.  Prox LAD to Mid LAD lesion is 90% stenosed.  Mid LAD lesion is 95% stenosed.    Left Circumflex  Prox Cx to Mid Cx lesion is 100% stenosed.  Mid Cx to Dist Cx lesion is 95% stenosed.    First Obtuse Marginal Branch  Vessel is small in size.    Second Obtuse Marginal Branch  Vessel is small in size.    Third Obtuse Marginal Branch  Vessel is large in size.    Right Coronary Artery  Mid RCA lesion is 85% stenosed.    LIMA Graft To Mid LAD    Graft To 3rd Mrg    Graft To Dist RCA  Prox Graft lesion is 20% stenosed.    Intervention   No interventions have been documented.   Left Heart  Left Ventricle The left ventricle is mildly dilated. LV end diastolic pressure is mildly elevated. The left ventricular ejection fraction is 25-35% by visual estimate.   Coronary Diagrams  Diagnostic Dominance: Right  Intervention      Physical Exam:   VS:  BP (!) 116/56   Pulse 73   Ht 5\' 11"  (1.803 m)   Wt 192 lb 9.6 oz (87.4 kg)   SpO2 97%   BMI 26.86 kg/m    Wt Readings from Last 3 Encounters:  03/24/23 192 lb 9.6 oz (87.4 kg)  03/08/23 187 lb (84.8 kg)  03/01/23 193 lb 12.8 oz (87.9 kg)    GEN: Well nourished, well developed in no acute distress NECK: No JVD; No carotid bruits CARDIAC: RRR, + systolic murmurs, rubs, gallops RESPIRATORY:  Clear to auscultation without rales, wheezing or rhonchi  ABDOMEN: Soft, non-tender, non-distended EXTREMITIES:  No edema; No deformity   ASSESSMENT AND PLAN: .   1.  CAD status post CABG -Recent cardiac catheterization without any  intervention -Continue current medications which include aspirin 81 mg daily, Coreg 6.25 mg twice daily, Jardiance 25 mg daily, Crestor 40 daily -Recent lab work reviewed with LDL 26, continue current medications  2.  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  VT status post ablation -ICD in place without any issues -Did offer cardiac monitor due to "fluttering" in his chest but patient deferred at this moment -He  is to call us if he experiences more fluttering, but for now can continue his current medication regimen  4.  HTN   -Blood pressure well-controlled today, 116/56 -Continue low-sodium, heart healthy diet -Patient states that he is lost around 60 pounds over the last 2 years -Would continue his current medication regimen    Dispo: He already has follow-up with Dr. Flora Lipps scheduled next month  Signed, Sharlene Dory, PA-C

## 2023-03-27 ENCOUNTER — Other Ambulatory Visit: Payer: Self-pay | Admitting: Student

## 2023-03-27 DIAGNOSIS — I472 Ventricular tachycardia, unspecified: Secondary | ICD-10-CM

## 2023-03-27 DIAGNOSIS — E119 Type 2 diabetes mellitus without complications: Secondary | ICD-10-CM | POA: Diagnosis not present

## 2023-04-03 NOTE — Progress Notes (Signed)
Remote ICD transmission.   

## 2023-04-12 ENCOUNTER — Other Ambulatory Visit: Payer: Self-pay | Admitting: Student

## 2023-04-12 ENCOUNTER — Other Ambulatory Visit: Payer: Self-pay | Admitting: Family Medicine

## 2023-04-12 DIAGNOSIS — H0015 Chalazion left lower eyelid: Secondary | ICD-10-CM | POA: Diagnosis not present

## 2023-04-12 DIAGNOSIS — I472 Ventricular tachycardia, unspecified: Secondary | ICD-10-CM

## 2023-04-13 NOTE — Telephone Encounter (Signed)
OV needed for additional refills, 30 day supply given until OV can be scheduled.  Requested Prescriptions  Pending Prescriptions Disp Refills   montelukast (SINGULAIR) 10 MG tablet [Pharmacy Med Name: MONTELUKAST SOD 10 MG TABLET] 30 tablet 0    Sig: TAKE 1 TABLET BY MOUTH EVERYDAY AT BEDTIME     Pulmonology:  Leukotriene Inhibitors Failed - 04/12/2023  2:48 AM      Failed - Valid encounter within last 12 months    Recent Outpatient Visits           1 year ago Right groin pain   [facility] Donita Brooks, MD   1 year ago Type 2 diabetes mellitus with hyperlipidemia (HCC)   [facility] Pickard, Priscille Heidelberg, MD   1 year ago Colon cancer screening   [facility] Donita Brooks, MD   2 years ago Hypercalcemia   [facility] Donita Brooks, MD   2 years ago Seasonal allergies   [facility] Pickard, Priscille Heidelberg, MD       Future Appointments             In 5 days O'Neal, Ronnald Ramp, MD [facility]

## 2023-04-17 NOTE — Progress Notes (Unsigned)
Cardiology Office Note:   Date:  04/18/2023  NAME:  Cameron Daniel    MRN: [id number] DOB:  1945/10/22   PCP:  Donita Brooks, MD  Cardiologist:  Meriam Sprague, MD  Electrophysiologist:  Lanier Prude, MD   Referring MD: Donita Brooks, MD   Chief Complaint  Patient presents with   Follow-up         History of Present Illness:   Cameron Daniel is a 77 y.o. male with a hx of CAD s/p CABG, VT s/p ICD, HTN, HLD who presents for follow-up.  He reports he is doing well.  Does get short of breath with heavy activity.  Current medications only include carvedilol, aspirin and Crestor.  No signs of volume overload.  Weights are stable.  He has been intolerant of further GDMT due to low blood pressure.  We did discuss trying to get him back on medications to see if this improves his shortness of breath.  He reports no chest pains.  No signs of volume overload.  Lipids are at goal.  Diabetes number well-controlled.  He reports no major symptoms in office today.  Problem List CAD s/p CABG -LIMA-LAD, SVG-OM, SVG-dRCA (04/26/2019) Systolic HF -EF 40-45% 08/10/2022 VT s/p ablation  HTN HLD -T chol 116, HDL 61, LDL 26, TG 223 6. DM -A1c 7.1  Past Medical History: Past Medical History:  Diagnosis Date   Allergic rhinitis, cause unspecified    Allergy    Anxiety    Arthritis    Cancer (HCC)    a couple places removed from eyelid   Carotid Doppler 04/2020   Carotid US 9/21: No evidence of ICA stenosis bilaterally; right vertebral artery stenosis   Cataract    Chronic combined systolic (congestive) and diastolic (congestive) heart failure (HCC)    Coronary atherosclerosis of unspecified type of vessel, native or graft    a. MI 1985 with unclear details. b. CABG 03/2019 with LIMA -> LAD, SVG -> OM, SVG to dRCA.   Depression    PTSD    Diverticulosis of colon (without mention of hemorrhage)    Esophageal reflux    Esophageal stricture    Essential hypertension     Former tobacco use    Hemorrhoids    Hyperlipidemia    ICD (implantable cardioverter-defibrillator) in place    Irritable bowel syndrome    Ischemic cardiomyopathy    Mild carotid artery disease (HCC)    a. 1-39% bilaterally 03/2019 duplex.   Myocardial infarction (HCC)    Nocturia    Obesity, unspecified    Orthostasis    Other acquired absence of organ    Personal history of colonic polyps    Psychosexual dysfunction with inhibited sexual excitement    Retroperitoneal bleed    Type II or unspecified type diabetes mellitus without mention of complication, not stated as uncontrolled    VT (ventricular tachycardia) (HCC)     Past Surgical History: Past Surgical History:  Procedure Laterality Date   ADENOIDECTOMY     CARPAL TUNNEL RELEASE     left    CHOLECYSTECTOMY     COLON RESECTION     18 inches   COLON SURGERY  2013   CORONARY ANGIOPLASTY     1985    CORONARY ARTERY BYPASS GRAFT N/A 04/26/2019   Procedure: CORONARY ARTERY BYPASS GRAFTING (CABG) x Three, using left internal mammary artery and right leg greater saphenous vein harvested endoscopically;  Surgeon: Alleen Borne, MD;  Location: [facility] OR;  Service: Open Heart Surgery;  Laterality: N/A;   ELBOW BURSA SURGERY     EYE SURGERY     ICD IMPLANT N/A 06/25/2020   Procedure: ICD IMPLANT;  Surgeon: Lanier Prude, MD;  Location: [facility] INVASIVE CV LAB;  Service: Cardiovascular;  Laterality: N/A;   LEFT HEART CATH AND CORS/GRAFTS ANGIOGRAPHY N/A 06/19/2020   Procedure: LEFT HEART CATH AND CORS/GRAFTS ANGIOGRAPHY;  Surgeon: Lennette Bihari, MD;  Location: [facility] INVASIVE CV LAB;  Service: Cardiovascular;  Laterality: N/A;   LEFT HEART CATH AND CORS/GRAFTS ANGIOGRAPHY N/A 03/08/2023   Procedure: LEFT HEART CATH AND CORS/GRAFTS ANGIOGRAPHY;  Surgeon: Iran Ouch, MD;  Location: [facility] INVASIVE CV LAB;  Service: Cardiovascular;  Laterality: N/A;   POLYPECTOMY     RIGHT/LEFT HEART CATH AND CORONARY ANGIOGRAPHY N/A 04/16/2019    Procedure: RIGHT/LEFT HEART CATH AND CORONARY ANGIOGRAPHY;  Surgeon: Lyn Records, MD;  Location: [facility] INVASIVE CV LAB;  Service: Cardiovascular;  Laterality: N/A;   SHOULDER SURGERY     Rt. Shoulder   TEE WITHOUT CARDIOVERSION N/A 04/26/2019   Procedure: TRANSESOPHAGEAL ECHOCARDIOGRAM (TEE);  Surgeon: Alleen Borne, MD;  Location: [facility] OR;  Service: Open Heart Surgery;  Laterality: N/A;   TONSILLECTOMY     V TACH ABLATION N/A 06/22/2020   Procedure: V TACH ABLATION;  Surgeon: Lanier Prude, MD;  Location: [facility] INVASIVE CV LAB;  Service: Cardiovascular;  Laterality: N/A;    Current Medications: Current Meds  Medication Sig   acetaminophen (TYLENOL) 500 MG tablet Take 1,000 mg by mouth at bedtime as needed for mild pain.   aspirin EC 81 MG tablet Take 1 tablet (81 mg total) by mouth daily.   azelastine (ASTELIN) 0.1 % nasal spray Place 2 sprays into both nostrils 2 (two) times daily. Use in each nostril as directed (Patient taking differently: Place 2 sprays into both nostrils daily as needed for allergies or rhinitis. Use in each nostril as directed)   Cetirizine HCl (ZERVIATE) 0.24 % SOLN Apply 1 drop to eye 2 (two) times daily.   cholestyramine (QUESTRAN) 4 g packet Take 0.5 packets (2 g total) by mouth 2 (two) times daily.   citalopram (CELEXA) 10 MG tablet Take 1 tablet (10 mg total) by mouth daily.   diphenoxylate-atropine (LOMOTIL) 2.5-0.025 MG tablet Take 2 tablets by mouth 4 (four) times daily as needed for diarrhea or loose stools.   empagliflozin (JARDIANCE) 25 MG TABS tablet TAKE 25 MG (1 TABLET) BY MOUTH DAILY BEFORE BREAKFAST. STOP PIOGLITAZONE   fexofenadine (ALLEGRA) 180 MG tablet Take 1 tablet (180 mg total) by mouth daily as needed. For allergies (Patient taking differently: Take 180 mg by mouth daily. For allergies)   finasteride (PROSCAR) 5 MG tablet TAKE 1 TABLET BY MOUTH EVERY DAY   hydrocortisone (ANUSOL-HC) 2.5 % rectal cream Place 1 Application rectally at bedtime.  (Patient taking differently: Place 1 Application rectally daily as needed for hemorrhoids or anal itching.)   montelukast (SINGULAIR) 10 MG tablet TAKE 1 TABLET BY MOUTH EVERYDAY AT BEDTIME   neomycin-polymyxin b-dexamethasone (MAXITROL) 3.5-10000-0.1 OINT Place 1 Application into both eyes 3 (three) times daily.   nystatin (MYCOSTATIN) 100000 UNIT/ML suspension Take 5 mLs (500,000 Units total) by mouth 4 (four) times daily.   olopatadine (PATADAY) 0.1 % ophthalmic solution Place 1 drop into both eyes daily as needed for allergies.   pantoprazole (PROTONIX) 40 MG tablet Take 1 tablet (40 mg total) by mouth 2 (two) times daily.   rosuvastatin (  CRESTOR) 40 MG tablet TAKE 1 TABLET BY MOUTH DAILY AT 6 PM.   sitaGLIPtin (JANUVIA) 50 MG tablet Take 1 tablet (50 mg total) by mouth daily.   spironolactone (ALDACTONE) 25 MG tablet Take 0.5 tablets (12.5 mg total) by mouth daily.   vitamin B-12 (CYANOCOBALAMIN) 100 MCG tablet Take 100 mcg by mouth daily.   [DISCONTINUED] carvedilol (COREG) 6.25 MG tablet Take 1 tablet (6.25 mg total) by mouth 2 (two) times daily.     Allergies:    Doxazosin mesylate, Losartan, Testosterone, Flomax [tamsulosin hcl], Penicillins, Sulfonamide derivatives, and Trulicity [dulaglutide]   Social History: Social History   Socioeconomic History   Marital status: Married    Spouse name: Not on file   Number of children: 1   Years of education: 12   Highest education level: High school graduate  Occupational History   Occupation: Therapist, sports- retired  Tobacco Use   Smoking status: Former    Current packs/day: 0.00    Types: Cigarettes    Quit date: 08/30/1983    Years since quitting: 39.6   Smokeless tobacco: Former   Tobacco comments:    Quit 30 years ago.  Vaping Use   Vaping status: Never Used  Substance and Sexual Activity   Alcohol use: Yes    Comment: rare   Drug use: No   Sexual activity: Yes  Other Topics Concern   Not on file  Social History Narrative   Not  on file   Social Determinants of Health   Financial Resource Strain: Low Risk  (12/22/2022)   Overall Financial Resource Strain (CARDIA)    Difficulty of Paying Living Expenses: Not hard at all  Food Insecurity: No Food Insecurity (12/22/2022)   Hunger Vital Sign    Worried About Running Out of Food in the Last Year: Never true    Ran Out of Food in the Last Year: Never true  Transportation Needs: No Transportation Needs (12/22/2022)   PRAPARE - Administrator, Civil Service (Medical): No    Lack of Transportation (Non-Medical): No  Physical Activity: Insufficiently Active (12/22/2022)   Exercise Vital Sign    Days of Exercise per Week: 3 days    Minutes of Exercise per Session: 30 min  Stress: No Stress Concern Present (12/22/2022)   [employer] of Occupational Health - Occupational Stress Questionnaire    Feeling of Stress : Not at all  Social Connections: Moderately Isolated (12/22/2022)   Social Connection and Isolation Panel [NHANES]    Frequency of Communication with Friends and Family: More than three times a week    Frequency of Social Gatherings with Friends and Family: More than three times a week    Attends Religious Services: Never    Database administrator or Organizations: No    Attends Engineer, structural: Never    Marital Status: Married     Family History: The patient's family history includes Allergic rhinitis in his father; Coronary artery disease in his mother; Lung cancer in his father. There is no history of Colon cancer, Heart attack, Hypertension, Stroke, Esophageal cancer, Rectal cancer, Stomach cancer, or Pancreatic cancer.  ROS:   All other ROS reviewed and negative. Pertinent positives noted in the HPI.     EKGs/Labs/Other Studies Reviewed:   The following studies were personally reviewed by me today:  EKG:  EKG is not ordered today.        Recent Labs: 12/05/2022: ALT 32; TSH 1.32 03/01/2023: BUN 11; Creatinine, Ser  0.83;  Hemoglobin 15.4; Platelets 124; Potassium 4.3; Sodium 137   Recent Lipid Panel    Component Value Date/Time   CHOL 116 05/10/2022 1105   TRIG 233 (H) 05/10/2022 1105   TRIG 193 (H) 08/08/2006 0924   HDL 61 05/10/2022 1105   CHOLHDL 1.9 05/10/2022 1105   VLDL 40 (H) 12/15/2016 0815   LDLCALC 26 05/10/2022 1105   LDLDIRECT 49.9 08/26/2013 1011    Physical Exam:   VS:  BP 114/66 (BP Location: Left Arm, Patient Position: Sitting, Cuff Size: Normal)   Pulse 70   Ht 5\' 10"  (1.778 m)   Wt 197 lb (89.4 kg)   SpO2 96%   BMI 28.27 kg/m    Wt Readings from Last 3 Encounters:  04/18/23 197 lb (89.4 kg)  03/24/23 192 lb 9.6 oz (87.4 kg)  03/08/23 187 lb (84.8 kg)    General: Well nourished, well developed, in no acute distress Head: Atraumatic, normal size  Eyes: PEERLA, EOMI  Neck: Supple, no JVD Endocrine: No thryomegaly Cardiac: Normal S1, S2; RRR; no murmurs, rubs, or gallops Lungs: Clear to auscultation bilaterally, no wheezing, rhonchi or rales  Abd: Soft, nontender, no hepatomegaly  Ext: No edema, pulses 2+ Musculoskeletal: No deformities, BUE and BLE strength normal and equal Skin: Warm and dry, no rashes   Neuro: Alert and oriented to person, place, time, and situation, CNII-XII grossly intact, no focal deficits  Psych: Normal mood and affect   ASSESSMENT:   Cameron Daniel is a 77 y.o. male who presents for the following: 1. Coronary artery disease involving coronary bypass graft of native heart without angina pectoris   2. Mixed hyperlipidemia   3. Ventricular tachycardia (HCC)   4. Coronary artery disease involving native coronary artery of native heart without angina pectoris   5. CAD in native artery   6. Chronic combined systolic and diastolic CHF (congestive heart failure) (HCC)   7. Essential hypertension   8. Hyperlipidemia LDL goal <70     PLAN:   1. Coronary artery disease involving coronary bypass graft of native heart without angina pectoris 2.  Mixed hyperlipidemia -CAD status post CABG.  On aspirin.  On beta-blocker.  On Crestor.  Lipids at goal.  No symptoms of angina.  3. Ventricular tachycardia (HCC) -Status post VT ablation and ICD.  No further episodes.  4. Chronic combined systolic and diastolic CHF (congestive heart failure) (HCC) -EF 40 to 45%.  No signs of volume overload.  Does get short of breath with activity.  On carvedilol 6.25 mg twice daily.  Reduce this to 3.125 mg twice daily.  We will try to get him on Aldactone 12.5 mg daily.  He is on [place].  I will have him come back in 3 months.  Hopefully we can get him on Entresto.  May need to add digoxin.  We will see how he does.  He will then see me in 6 months.  I have encouraged him to remain active.      Disposition: Return in about 3 months (around 07/19/2023).  Medication Adjustments/Labs and Tests Ordered: Current medicines are reviewed at length with the patient today.  Concerns regarding medicines are outlined above.  No orders of the defined types were placed in this encounter.  Meds ordered this encounter  Medications   carvedilol (COREG) 6.25 MG tablet    Sig: Take 0.5 tablets (3.125 mg total) by mouth 2 (two) times daily.    Dispense:  90 tablet  Refill:  2    Dose decrease   spironolactone (ALDACTONE) 25 MG tablet    Sig: Take 0.5 tablets (12.5 mg total) by mouth daily.    Dispense:  15 tablet    Refill:  6   Patient Instructions  Medication Instructions:  DECREASE CARVEDILOL 3.125MG  DAILY ADD SPIRONOLACTONE 12.5MG  DAILY *If you need a refill on your cardiac medications before your next appointment, please call your pharmacy*  Lab Work: NONE If you have labs (blood work) drawn today and your tests are completely normal, you will receive your results only by:  MyChart Message (if you have MyChart) OR  A paper copy in the mail If you have any lab test that is abnormal or we need to change your treatment, we will call you to review the  results.  Testing/Procedures: NONE  Follow-Up: At [facility], you and your health needs are our priority.  As part of our continuing mission to provide you with exceptional heart care, we have created designated Provider Care Teams.  These Care Teams include your primary Cardiologist (physician) and Advanced Practice Providers (APPs -  Physician Assistants and Nurse Practitioners) who all work together to provide you with the care you need, when you need it.  We recommend signing up for the patient portal called "MyChart".  Sign up information is provided on this After Visit Summary.  MyChart is used to connect with patients for Virtual Visits (Telemedicine).  Patients are able to view lab/test results, encounter notes, upcoming appointments, etc.  Non-urgent messages can be sent to your provider as well.   To learn more about what you can do with MyChart, go to [email or web address].    Your next appointment:   3 month(s)  Provider:   WITH APP  AND 6 MO WITH DR O'NEAL Other Instructions    Time Spent with Patient: I have spent a total of 35 minutes with patient reviewing hospital notes, telemetry, EKGs, labs and examining the patient as well as establishing an assessment and plan that was discussed with the patient.  > 50% of time was spent in direct patient care.  Signed, Lenna Gilford. Flora Lipps, MD, [facility]  [facility]  [address] [place], [place] [address] [phone]  04/18/2023 10:38 AM

## 2023-04-18 ENCOUNTER — Encounter: Payer: Self-pay | Admitting: Cardiovascular Disease

## 2023-04-18 ENCOUNTER — Ambulatory Visit: Payer: Medicare HMO | Attending: Cardiovascular Disease | Admitting: Cardiovascular Disease

## 2023-04-18 VITALS — BP 114/66 | HR 70 | Ht 70.0 in | Wt 197.0 lb

## 2023-04-18 DIAGNOSIS — E785 Hyperlipidemia, unspecified: Secondary | ICD-10-CM | POA: Diagnosis not present

## 2023-04-18 DIAGNOSIS — I25810 Atherosclerosis of coronary artery bypass graft(s) without angina pectoris: Secondary | ICD-10-CM | POA: Diagnosis not present

## 2023-04-18 DIAGNOSIS — I472 Ventricular tachycardia, unspecified: Secondary | ICD-10-CM

## 2023-04-18 DIAGNOSIS — I5042 Chronic combined systolic (congestive) and diastolic (congestive) heart failure: Secondary | ICD-10-CM

## 2023-04-18 DIAGNOSIS — I10 Essential (primary) hypertension: Secondary | ICD-10-CM | POA: Diagnosis not present

## 2023-04-18 DIAGNOSIS — I2510 Atherosclerotic heart disease of native coronary artery without angina pectoris: Secondary | ICD-10-CM

## 2023-04-18 DIAGNOSIS — E782 Mixed hyperlipidemia: Secondary | ICD-10-CM | POA: Diagnosis not present

## 2023-04-18 MED ORDER — SPIRONOLACTONE 25 MG PO TABS: 12.5000 mg | ORAL_TABLET | Freq: Every day | ORAL | 6 refills | Status: DC

## 2023-04-18 MED ORDER — CARVEDILOL 6.25 MG PO TABS: 3.1250 mg | ORAL_TABLET | Freq: Two times a day (BID) | ORAL | 2 refills | Status: DC

## 2023-04-18 NOTE — Patient Instructions (Addendum)
Medication Instructions:  DECREASE CARVEDILOL 3.125MG  DAILY ADD SPIRONOLACTONE 12.5MG  DAILY *If you need a refill on your cardiac medications before your next appointment, please call your pharmacy*  Lab Work: NONE If you have labs (blood work) drawn today and your tests are completely normal, you will receive your results only by:  MyChart Message (if you have MyChart) OR  A paper copy in the mail If you have any lab test that is abnormal or we need to change your treatment, we will call you to review the results.  Testing/Procedures: NONE  Follow-Up: At [facility], you and your health needs are our priority.  As part of our continuing mission to provide you with exceptional heart care, we have created designated Provider Care Teams.  These Care Teams include your primary Cardiologist (physician) and Advanced Practice Providers (APPs -  Physician Assistants and Nurse Practitioners) who all work together to provide you with the care you need, when you need it.  We recommend signing up for the patient portal called "MyChart".  Sign up information is provided on this After Visit Summary.  MyChart is used to connect with patients for Virtual Visits (Telemedicine).  Patients are able to view lab/test results, encounter notes, upcoming appointments, etc.  Non-urgent messages can be sent to your provider as well.   To learn more about what you can do with MyChart, go to [email or web address].    Your next appointment:   3 month(s)  Provider:   WITH APP  AND 6 MO WITH DR O'NEAL Other Instructions

## 2023-04-28 DIAGNOSIS — M1711 Unilateral primary osteoarthritis, right knee: Secondary | ICD-10-CM | POA: Diagnosis not present

## 2023-05-04 ENCOUNTER — Ambulatory Visit (INDEPENDENT_AMBULATORY_CARE_PROVIDER_SITE_OTHER): Payer: Medicare HMO | Admitting: Family Medicine

## 2023-05-04 VITALS — BP 124/76 | HR 84 | Temp 97.5°F | Ht 70.0 in | Wt 190.0 lb

## 2023-05-04 DIAGNOSIS — R6881 Early satiety: Secondary | ICD-10-CM | POA: Diagnosis not present

## 2023-05-04 DIAGNOSIS — R140 Abdominal distension (gaseous): Secondary | ICD-10-CM

## 2023-05-04 MED ORDER — METRONIDAZOLE 500 MG PO TABS: 500.0000 mg | ORAL_TABLET | Freq: Three times a day (TID) | ORAL | 0 refills | Status: AC

## 2023-05-04 NOTE — Progress Notes (Signed)
Subjective:    Patient ID: Cameron Daniel, male    DOB: [birth year], 77 y.o.   MRN: [id number]  GI Problem   08/02/22 Patient is a 77 year old Caucasian gentleman who reports several weeks of bloating and abdominal discomfort.  He states that he is having early satiety.  He states that he will eat very little and suddenly feel extremely ill and start developing.  He has a lot of upper abdominal gas and distention per his report.  He also feels nauseated.  He denies any vomiting.  He denies any hematemesis.  He denies any dysphagia.  He denies any food getting stuck in his throat.  However he states that he feels like he eats 1 or 2 bites and cannot eat anymore due to bloating and distention.  He does occasionally have constipation but he still having bowel movements almost on a daily basis.  He denies any melena or hematochezia.  He denies any diarrhea.  He denies any fevers or chills.  He denies any abdominal pain.  He denies any significant weight loss.  At that time, my plan was:  Differential diagnosis is broad.  First am concerned about possible gastroparesis given his longstanding history of diabetes as well as his neuropathy in his feet.  Therefore we will try the patient empirically on Reglan 10 mg 3 times a day for a week and see if that helps.  He is already on twice daily Protonix and Pepcid so I do not feel that this is acid reflux.  I would be concerned about gastric outlet obstruction.  So if symptoms or not improving, consider a GI referral for EGD.  Patient has recently had a colonoscopy.  However he would be at risk for possible constipation causing bloating and abdominal distention so I will send the patient for abdominal x-ray to evaluate for an elevated stool burden and if present, we will try to facilitate with a better stool regimen.  08/11/22 Abdominal x-ray was unremarkable.  Patient tried MiraLAX for 4 to 5 days and had several good bowel movements however he continues to  report abdominal bloating, increased gas and burping, and early satiety.  He states that he will eat 1 or 2 bites and get full.  He denies any vomiting.  He denies any abdominal pain.  He denies any fevers or chills.  He is already on twice daily Protonix.  05/04/23 CT 2/24 was clear: IMPRESSION: No acute findings.   Further decrease in size of right retroperitoneal fat attenuation lesion, consistent with residual changes from previously demonstrated retroperitoneal hematoma at this site.   Colonic diverticulosis, without radiographic evidence of diverticulitis.   Stable small right inguinal hernia, which contains only fat.   Aortic Atherosclerosis (ICD10-I70.0).  GI performed egd and biopsies were unremarkable.  Did find esophageal stricture.  GI tried metronidazole for possible small bowel overgrowth In February which seemed to help per Dr. Lamar Sprinkles last note.   Wt Readings from Last 3 Encounters:  05/04/23 190 lb (86.2 kg)  04/18/23 197 lb (89.4 kg)  03/24/23 192 lb 9.6 oz (87.4 kg)   Continues to lose weight,  Was 220 lbs in 9/23.  Patient states that he was doing better when he last saw GI however recently, he has developed early satiety again.  He reports bloating.  He reports excessive gas.  He reports intermittent constipation followed by diarrhea.  He denies any nausea or vomiting.  He denies any heartburn.  He denies any melena or hematochezia.  He has no history of pancreatitis. Past Medical History:  Diagnosis Date   Allergic rhinitis, cause unspecified    Allergy    Anxiety    Arthritis    Cancer (HCC)    a couple places removed from eyelid   Carotid Doppler 04/2020   Carotid US 9/21: No evidence of ICA stenosis bilaterally; right vertebral artery stenosis   Cataract    Chronic combined systolic (congestive) and diastolic (congestive) heart failure (HCC)    Coronary atherosclerosis of unspecified type of vessel, native or graft    a. MI 1985 with unclear details. b.  CABG 03/2019 with LIMA -> LAD, SVG -> OM, SVG to dRCA.   Depression    PTSD    Diverticulosis of colon (without mention of hemorrhage)    Esophageal reflux    Esophageal stricture    Essential hypertension    Former tobacco use    Hemorrhoids    Hyperlipidemia    ICD (implantable cardioverter-defibrillator) in place    Irritable bowel syndrome    Ischemic cardiomyopathy    Mild carotid artery disease (HCC)    a. 1-39% bilaterally 03/2019 duplex.   Myocardial infarction (HCC)    Nocturia    Obesity, unspecified    Orthostasis    Other acquired absence of organ    Personal history of colonic polyps    Psychosexual dysfunction with inhibited sexual excitement    Retroperitoneal bleed    Type II or unspecified type diabetes mellitus without mention of complication, not stated as uncontrolled    VT (ventricular tachycardia) (HCC)    Past Surgical History:  Procedure Laterality Date   ADENOIDECTOMY     CARPAL TUNNEL RELEASE     left    CHOLECYSTECTOMY     COLON RESECTION     18 inches   COLON SURGERY  2013   CORONARY ANGIOPLASTY     1985    CORONARY ARTERY BYPASS GRAFT N/A 04/26/2019   Procedure: CORONARY ARTERY BYPASS GRAFTING (CABG) x Three, using left internal mammary artery and right leg greater saphenous vein harvested endoscopically;  Surgeon: Alleen Borne, MD;  Location: [facility] OR;  Service: Open Heart Surgery;  Laterality: N/A;   ELBOW BURSA SURGERY     EYE SURGERY     ICD IMPLANT N/A 06/25/2020   Procedure: ICD IMPLANT;  Surgeon: Lanier Prude, MD;  Location: [facility] INVASIVE CV LAB;  Service: Cardiovascular;  Laterality: N/A;   LEFT HEART CATH AND CORS/GRAFTS ANGIOGRAPHY N/A 06/19/2020   Procedure: LEFT HEART CATH AND CORS/GRAFTS ANGIOGRAPHY;  Surgeon: Lennette Bihari, MD;  Location: [facility] INVASIVE CV LAB;  Service: Cardiovascular;  Laterality: N/A;   LEFT HEART CATH AND CORS/GRAFTS ANGIOGRAPHY N/A 03/08/2023   Procedure: LEFT HEART CATH AND CORS/GRAFTS ANGIOGRAPHY;   Surgeon: Iran Ouch, MD;  Location: [facility] INVASIVE CV LAB;  Service: Cardiovascular;  Laterality: N/A;   POLYPECTOMY     RIGHT/LEFT HEART CATH AND CORONARY ANGIOGRAPHY N/A 04/16/2019   Procedure: RIGHT/LEFT HEART CATH AND CORONARY ANGIOGRAPHY;  Surgeon: Lyn Records, MD;  Location: [facility] INVASIVE CV LAB;  Service: Cardiovascular;  Laterality: N/A;   SHOULDER SURGERY     Rt. Shoulder   TEE WITHOUT CARDIOVERSION N/A 04/26/2019   Procedure: TRANSESOPHAGEAL ECHOCARDIOGRAM (TEE);  Surgeon: Alleen Borne, MD;  Location: [facility] OR;  Service: Open Heart Surgery;  Laterality: N/A;   TONSILLECTOMY     V TACH ABLATION N/A 06/22/2020   Procedure: V TACH ABLATION;  Surgeon: Lanier Prude, MD;  Location: [facility] INVASIVE CV LAB;  Service: Cardiovascular;  Laterality: N/A;   Current Outpatient Medications on File Prior to Visit  Medication Sig Dispense Refill   acetaminophen (TYLENOL) 500 MG tablet Take 1,000 mg by mouth at bedtime as needed for mild pain.     aspirin EC 81 MG tablet Take 1 tablet (81 mg total) by mouth daily. 90 tablet 3   azelastine (ASTELIN) 0.1 % nasal spray Place 2 sprays into both nostrils 2 (two) times daily. Use in each nostril as directed (Patient taking differently: Place 2 sprays into both nostrils daily as needed for allergies or rhinitis. Use in each nostril as directed) 30 mL 12   carvedilol (COREG) 6.25 MG tablet Take 0.5 tablets (3.125 mg total) by mouth 2 (two) times daily. 90 tablet 2   Cetirizine HCl (ZERVIATE) 0.24 % SOLN Apply 1 drop to eye 2 (two) times daily. 30 each 3   cholestyramine (QUESTRAN) 4 g packet Take 0.5 packets (2 g total) by mouth 2 (two) times daily. 60 each 3   citalopram (CELEXA) 10 MG tablet Take 1 tablet (10 mg total) by mouth daily. 90 tablet 1   diphenoxylate-atropine (LOMOTIL) 2.5-0.025 MG tablet Take 2 tablets by mouth 4 (four) times daily as needed for diarrhea or loose stools. 30 tablet 0   empagliflozin (JARDIANCE) 25 MG TABS tablet TAKE 25 MG  (1 TABLET) BY MOUTH DAILY BEFORE BREAKFAST. STOP PIOGLITAZONE 90 tablet 0   fexofenadine (ALLEGRA) 180 MG tablet Take 1 tablet (180 mg total) by mouth daily as needed. For allergies (Patient taking differently: Take 180 mg by mouth daily. For allergies) 90 tablet 0   finasteride (PROSCAR) 5 MG tablet TAKE 1 TABLET BY MOUTH EVERY DAY 90 tablet 3   hydrocortisone (ANUSOL-HC) 2.5 % rectal cream Place 1 Application rectally at bedtime. (Patient taking differently: Place 1 Application rectally daily as needed for hemorrhoids or anal itching.) 30 g 1   montelukast (SINGULAIR) 10 MG tablet TAKE 1 TABLET BY MOUTH EVERYDAY AT BEDTIME 30 tablet 0   neomycin-polymyxin b-dexamethasone (MAXITROL) 3.5-10000-0.1 OINT Place 1 Application into both eyes 3 (three) times daily.     nystatin (MYCOSTATIN) 100000 UNIT/ML suspension Take 5 mLs (500,000 Units total) by mouth 4 (four) times daily. 60 mL 0   olopatadine (PATADAY) 0.1 % ophthalmic solution Place 1 drop into both eyes daily as needed for allergies.     pantoprazole (PROTONIX) 40 MG tablet Take 1 tablet (40 mg total) by mouth 2 (two) times daily. 60 tablet 11   rosuvastatin (CRESTOR) 40 MG tablet TAKE 1 TABLET BY MOUTH DAILY AT 6 PM. 90 tablet 3   sitaGLIPtin (JANUVIA) 50 MG tablet Take 1 tablet (50 mg total) by mouth daily. 90 tablet 3   spironolactone (ALDACTONE) 25 MG tablet Take 0.5 tablets (12.5 mg total) by mouth daily. 15 tablet 6   vitamin B-12 (CYANOCOBALAMIN) 100 MCG tablet Take 100 mcg by mouth daily.     No current facility-administered medications on file prior to visit.   Allergies  Allergen Reactions   Doxazosin Mesylate Other (See Comments)    Drops in blood pressure    Losartan     Other reaction(s): Muscle pain   Testosterone     Other reaction(s): Erythrocytosis Other reaction(s): Erythrocytosis   Flomax [Tamsulosin Hcl] Itching and Other (See Comments)    Drops in blood pressure     Penicillins Rash and Other (See Comments)     Has patient had a PCN reaction causing  immediate rash, facial/tongue/throat swelling, SOB or lightheadedness with hypotension: No Has patient had a PCN reaction causing severe rash involving mucus membranes or skin necrosis: No Has patient had a PCN reaction that required hospitalization No Has patient had a PCN reaction occurring within the last 10 years: No If all of the above answers are "NO", then may proceed with Cephalosporin use.    Sulfonamide Derivatives Rash   Trulicity [Dulaglutide] Nausea And Vomiting    N&V, Dizziness   Social History   Socioeconomic History   Marital status: Married    Spouse name: Not on file   Number of children: 1   Years of education: 12   Highest education level: High school graduate  Occupational History   Occupation: Therapist, sports- retired  Tobacco Use   Smoking status: Former    Current packs/day: 0.00    Types: Cigarettes    Quit date: 08/30/1983    Years since quitting: 39.7   Smokeless tobacco: Former   Tobacco comments:    Quit 30 years ago.  Vaping Use   Vaping status: Never Used  Substance and Sexual Activity   Alcohol use: Yes    Comment: rare   Drug use: No   Sexual activity: Yes  Other Topics Concern   Not on file  Social History Narrative   Not on file   Social Determinants of Health   Financial Resource Strain: Low Risk  (12/22/2022)   Overall Financial Resource Strain (CARDIA)    Difficulty of Paying Living Expenses: Not hard at all  Food Insecurity: No Food Insecurity (12/22/2022)   Hunger Vital Sign    Worried About Running Out of Food in the Last Year: Never true    Ran Out of Food in the Last Year: Never true  Transportation Needs: No Transportation Needs (12/22/2022)   PRAPARE - Administrator, Civil Service (Medical): No    Lack of Transportation (Non-Medical): No  Physical Activity: Insufficiently Active (12/22/2022)   Exercise Vital Sign    Days of Exercise per Week: 3 days    Minutes of Exercise per  Session: 30 min  Stress: No Stress Concern Present (12/22/2022)   [employer] of Occupational Health - Occupational Stress Questionnaire    Feeling of Stress : Not at all  Social Connections: Moderately Isolated (12/22/2022)   Social Connection and Isolation Panel [NHANES]    Frequency of Communication with Friends and Family: More than three times a week    Frequency of Social Gatherings with Friends and Family: More than three times a week    Attends Religious Services: Never    Database administrator or Organizations: No    Attends Banker Meetings: Never    Marital Status: Married  Catering manager Violence: Not At Risk (12/22/2022)   Humiliation, Afraid, Rape, and Kick questionnaire    Fear of Current or Ex-Partner: No    Emotionally Abused: No    Physically Abused: No    Sexually Abused: No     Review of Systems     Objective:   Physical Exam Vitals reviewed.  Constitutional:      General: He is not in acute distress.    Appearance: Normal appearance. He is obese. He is not ill-appearing or toxic-appearing.  HENT:     Right Ear: Tympanic membrane and ear canal normal.     Left Ear: Tympanic membrane and ear canal normal.     Nose: Nose normal.     Mouth/Throat:  Mouth: Mucous membranes are moist.  Eyes:     General: Lids are normal.     Extraocular Movements:     Right eye: Normal extraocular motion.     Left eye: Normal extraocular motion.     Conjunctiva/sclera:     Left eye: Left conjunctiva is not injected.  Cardiovascular:     Rate and Rhythm: Normal rate and regular rhythm.     Pulses: Normal pulses.     Heart sounds: Normal heart sounds. No murmur heard.    No friction rub. No gallop.  Pulmonary:     Effort: Pulmonary effort is normal. No respiratory distress.     Breath sounds: Normal breath sounds. No stridor. No wheezing, rhonchi or rales.  Abdominal:     General: Abdomen is flat. Bowel sounds are normal. There is no distension.      Palpations: Abdomen is soft. There is no mass.     Tenderness: There is no abdominal tenderness. There is no guarding or rebound.     Hernia: No hernia is present.  Musculoskeletal:     Right lower leg: No edema.     Left lower leg: No edema.     Right foot: Normal range of motion and normal capillary refill. Tenderness present. No swelling, prominent metatarsal heads or crepitus.     Left foot: Normal range of motion and normal capillary refill. No swelling, tenderness or crepitus.  Skin:    Findings: No rash.  Neurological:     General: No focal deficit present.     Mental Status: He is alert and oriented to person, place, and time. Mental status is at baseline.  Psychiatric:        Mood and Affect: Mood normal.        Behavior: Behavior normal.        Thought Content: Thought content normal.        Judgment: Judgment normal.           Assessment & Plan:  Bloating  Early satiety  Abdominal distention Differential diagnosis includes IBS versus small bowel bacterial overgrowth syndrome versus pancreatic insufficiency.  Try the patient on Flagyl 500 mg 3 times daily for 10 days and add a probiotic such as align.  If not improving, may try empiric treatment for pancreatic insufficiency given his weight loss and negative workup today.

## 2023-05-06 ENCOUNTER — Other Ambulatory Visit: Payer: Self-pay | Admitting: Family Medicine

## 2023-05-08 NOTE — Telephone Encounter (Signed)
Requested Prescriptions  Pending Prescriptions Disp Refills   montelukast (SINGULAIR) 10 MG tablet [Pharmacy Med Name: MONTELUKAST SOD 10 MG TABLET] 90 tablet 0    Sig: TAKE 1 TABLET BY MOUTH EVERYDAY AT BEDTIME     Pulmonology:  Leukotriene Inhibitors Failed - 05/06/2023  4:32 PM      Failed - Valid encounter within last 12 months    Recent Outpatient Visits           1 year ago Right groin pain   [facility] Donita Brooks, MD   1 year ago Type 2 diabetes mellitus with hyperlipidemia (HCC)   [facility] Pickard, Priscille Heidelberg, MD   1 year ago Colon cancer screening   [facility] Donita Brooks, MD   2 years ago Hypercalcemia   [facility] Donita Brooks, MD   2 years ago Seasonal allergies   [facility] Pickard, Priscille Heidelberg, MD       Future Appointments             In 2 months Lyman Bishop, Bettey Mare, NP [facility]

## 2023-05-23 ENCOUNTER — Other Ambulatory Visit: Payer: Self-pay | Admitting: Family Medicine

## 2023-05-23 NOTE — Telephone Encounter (Signed)
Requested medication (s) are due for refill today - yes  Requested medication (s) are on the active medication list -yes  Future visit scheduled -no  Last refill: 05/23/22 #90 3RF  Notes to clinic: fails lab protocol- over 1 year- 05/10/22  Requested Prescriptions  Pending Prescriptions Disp Refills   rosuvastatin (CRESTOR) 40 MG tablet [Pharmacy Med Name: ROSUVASTATIN CALCIUM 40 MG TAB] 90 tablet 3    Sig: TAKE 1 TABLET BY MOUTH DAILY AT 6 PM.     Cardiovascular:  Antilipid - Statins 2 Failed - 05/23/2023  2:41 AM      Failed - Valid encounter within last 12 months    Recent Outpatient Visits           1 year ago Right groin pain   [facility] Donita Brooks, MD   1 year ago Type 2 diabetes mellitus with hyperlipidemia (HCC)   [facility] Pickard, Priscille Heidelberg, MD   1 year ago Colon cancer screening   [facility] Donita Brooks, MD   2 years ago Hypercalcemia   [facility] Tanya Nones, Priscille Heidelberg, MD   2 years ago Seasonal allergies   [facility] Pickard, Priscille Heidelberg, MD       Future Appointments             In 1 month Jodelle Gross, NP [place] [facility]            Failed - Lipid Panel in normal range within the last 12 months    Cholesterol  Date Value Ref Range Status  05/10/2022 116 <200 mg/dL Final   LDL Cholesterol (Calc)  Date Value Ref Range Status  05/10/2022 26 mg/dL (calc) Final    Comment:    Reference range: <100 . Desirable range <100 mg/dL for primary prevention;   <70 mg/dL for patients with CHD or diabetic patients  with > or = 2 CHD risk factors. Marland Kitchen LDL-C is now calculated using the Martin-Hopkins  calculation, which is a validated novel method providing  better accuracy than the Friedewald equation in the  estimation of LDL-C.  Horald Pollen et al. Lenox Ahr. 1610;960(45): 2061-2068  (http://education.[facility].com/faq/FAQ164)     Direct LDL  Date Value Ref Range Status  08/26/2013 49.9 mg/dL Final    Comment:    Optimal:  <100 mg/dLNear or Above Optimal:  100-129 mg/dLBorderline High:  130-159 mg/dLHigh:  160-189 mg/dLVery High:  >190 mg/dL   HDL  Date Value Ref Range Status  05/10/2022 61 > OR = 40 mg/dL Final   Triglycerides  Date Value Ref Range Status  05/10/2022 233 (H) <150 mg/dL Final    Comment:    . If a non-fasting specimen was collected, consider repeat triglyceride testing on a fasting specimen if clinically indicated.  Perry Mount et al. J. of Clin. Lipidol. 2015;9:129-169. .    Triglyceride fasting, serum  Date Value Ref Range Status  08/08/2006 193 (H) 0 - 149 mg/dL Final    Comment:    See lab report for associated comment(s)         Passed - Cr in normal range and within 360 days    Creat  Date Value Ref Range Status  12/05/2022 0.80 0.70 - 1.28 mg/dL Final   Creatinine, Ser  Date Value Ref Range Status  03/01/2023 0.83 0.76 - 1.27 mg/dL Final         Passed - Patient is not pregnant  Requested Prescriptions  Pending Prescriptions Disp Refills   rosuvastatin (CRESTOR) 40 MG tablet [Pharmacy Med Name: ROSUVASTATIN CALCIUM 40 MG TAB] 90 tablet 3    Sig: TAKE 1 TABLET BY MOUTH DAILY AT 6 PM.     Cardiovascular:  Antilipid - Statins 2 Failed - 05/23/2023  2:41 AM      Failed - Valid encounter within last 12 months    Recent Outpatient Visits           1 year ago Right groin pain   [facility] Donita Brooks, MD   1 year ago Type 2 diabetes mellitus with hyperlipidemia (HCC)   [facility] Pickard, Priscille Heidelberg, MD   1 year ago Colon cancer screening   [facility] Donita Brooks, MD   2 years ago Hypercalcemia   [facility] Donita Brooks, MD   2 years ago Seasonal allergies   [facility] Pickard, Priscille Heidelberg, MD       Future Appointments             In 1 month  Jodelle Gross, NP [place] [facility]            Failed - Lipid Panel in normal range within the last 12 months    Cholesterol  Date Value Ref Range Status  05/10/2022 116 <200 mg/dL Final   LDL Cholesterol (Calc)  Date Value Ref Range Status  05/10/2022 26 mg/dL (calc) Final    Comment:    Reference range: <100 . Desirable range <100 mg/dL for primary prevention;   <70 mg/dL for patients with CHD or diabetic patients  with > or = 2 CHD risk factors. Marland Kitchen LDL-C is now calculated using the Martin-Hopkins  calculation, which is a validated novel method providing  better accuracy than the Friedewald equation in the  estimation of LDL-C.  Horald Pollen et al. Lenox Ahr. 1610;960(45): 2061-2068  (http://education.[facility].com/faq/FAQ164)    Direct LDL  Date Value Ref Range Status  08/26/2013 49.9 mg/dL Final    Comment:    Optimal:  <100 mg/dLNear or Above Optimal:  100-129 mg/dLBorderline High:  130-159 mg/dLHigh:  160-189 mg/dLVery High:  >190 mg/dL   HDL  Date Value Ref Range Status  05/10/2022 61 > OR = 40 mg/dL Final   Triglycerides  Date Value Ref Range Status  05/10/2022 233 (H) <150 mg/dL Final    Comment:    . If a non-fasting specimen was collected, consider repeat triglyceride testing on a fasting specimen if clinically indicated.  Perry Mount et al. J. of Clin. Lipidol. 2015;9:129-169. .    Triglyceride fasting, serum  Date Value Ref Range Status  08/08/2006 193 (H) 0 - 149 mg/dL Final    Comment:    See lab report for associated comment(s)         Passed - Cr in normal range and within 360 days    Creat  Date Value Ref Range Status  12/05/2022 0.80 0.70 - 1.28 mg/dL Final   Creatinine, Ser  Date Value Ref Range Status  03/01/2023 0.83 0.76 - 1.27 mg/dL Final         Passed - Patient is not pregnant

## 2023-06-01 ENCOUNTER — Other Ambulatory Visit (HOSPITAL_COMMUNITY): Payer: Self-pay

## 2023-06-02 ENCOUNTER — Encounter: Payer: Self-pay | Admitting: Family Medicine

## 2023-06-02 ENCOUNTER — Ambulatory Visit: Payer: Medicare HMO | Admitting: Family Medicine

## 2023-06-02 VITALS — BP 130/82 | HR 79 | Temp 97.9°F | Ht 70.0 in | Wt 185.0 lb

## 2023-06-02 DIAGNOSIS — K649 Unspecified hemorrhoids: Secondary | ICD-10-CM

## 2023-06-02 DIAGNOSIS — I5042 Chronic combined systolic (congestive) and diastolic (congestive) heart failure: Secondary | ICD-10-CM | POA: Diagnosis not present

## 2023-06-02 DIAGNOSIS — I10 Essential (primary) hypertension: Secondary | ICD-10-CM | POA: Diagnosis not present

## 2023-06-02 DIAGNOSIS — I472 Ventricular tachycardia, unspecified: Secondary | ICD-10-CM

## 2023-06-02 DIAGNOSIS — I2510 Atherosclerotic heart disease of native coronary artery without angina pectoris: Secondary | ICD-10-CM

## 2023-06-02 DIAGNOSIS — E782 Mixed hyperlipidemia: Secondary | ICD-10-CM

## 2023-06-02 DIAGNOSIS — E785 Hyperlipidemia, unspecified: Secondary | ICD-10-CM

## 2023-06-02 MED ORDER — CITALOPRAM HYDROBROMIDE 10 MG PO TABS: 10.0000 mg | ORAL_TABLET | Freq: Every day | ORAL | 1 refills | Status: DC

## 2023-06-02 MED ORDER — HYDROCORTISONE (PERIANAL) 2.5 % EX CREA
1.0000 | TOPICAL_CREAM | Freq: Every day | CUTANEOUS | 1 refills | Status: DC | PRN
Start: 2023-06-02 — End: 2023-06-02

## 2023-06-02 MED ORDER — HYDROCORTISONE (PERIANAL) 2.5 % EX CREA
1.0000 | TOPICAL_CREAM | Freq: Every day | CUTANEOUS | 1 refills | Status: DC | PRN
Start: 2023-06-02 — End: 2024-06-06

## 2023-06-02 NOTE — Progress Notes (Signed)
Subjective:    Patient ID: Cameron Daniel, male    DOB: 05-13-1946, 77 y.o.   MRN: [id number]  GI Problem   08/02/22 Patient is a 77 year old Caucasian gentleman who reports several weeks of bloating and abdominal discomfort.  He states that he is having early satiety.  He states that he will eat very little and suddenly feel extremely ill and start developing.  He has a lot of upper abdominal gas and distention per his report.  He also feels nauseated.  He denies any vomiting.  He denies any hematemesis.  He denies any dysphagia.  He denies any food getting stuck in his throat.  However he states that he feels like he eats 1 or 2 bites and cannot eat anymore due to bloating and distention.  He does occasionally have constipation but he still having bowel movements almost on a daily basis.  He denies any melena or hematochezia.  He denies any diarrhea.  He denies any fevers or chills.  He denies any abdominal pain.  He denies any significant weight loss.  At that time, my plan was:  Differential diagnosis is broad.  First am concerned about possible gastroparesis given his longstanding history of diabetes as well as his neuropathy in his feet.  Therefore we will try the patient empirically on Reglan 10 mg 3 times a day for a week and see if that helps.  He is already on twice daily Protonix and Pepcid so I do not feel that this is acid reflux.  I would be concerned about gastric outlet obstruction.  So if symptoms or not improving, consider a GI referral for EGD.  Patient has recently had a colonoscopy.  However he would be at risk for possible constipation causing bloating and abdominal distention so I will send the patient for abdominal x-ray to evaluate for an elevated stool burden and if present, we will try to facilitate with a better stool regimen.  08/11/22 Abdominal x-ray was unremarkable.  Patient tried MiraLAX for 4 to 5 days and had several good bowel movements however he continues to  report abdominal bloating, increased gas and burping, and early satiety.  He states that he will eat 1 or 2 bites and get full.  He denies any vomiting.  He denies any abdominal pain.  He denies any fevers or chills.  He is already on twice daily Protonix.  05/04/23 CT 2/24 was clear: IMPRESSION: No acute findings.   Further decrease in size of right retroperitoneal fat attenuation lesion, consistent with residual changes from previously demonstrated retroperitoneal hematoma at this site.   Colonic diverticulosis, without radiographic evidence of diverticulitis.   Stable small right inguinal hernia, which contains only fat.   Aortic Atherosclerosis (ICD10-I70.0).  GI performed egd and biopsies were unremarkable.  Did find esophageal stricture.  GI tried metronidazole for possible small bowel overgrowth In February which seemed to help per Dr. Lamar Sprinkles last note.   Wt Readings from Last 3 Encounters:  05/04/23 190 lb (86.2 kg)  04/18/23 197 lb (89.4 kg)  03/24/23 192 lb 9.6 oz (87.4 kg)   Continues to lose weight,  Was 220 lbs in 9/23.  Patient states that he was doing better when he last saw GI however recently, he has developed early satiety again.  He reports bloating.  He reports excessive gas.  He reports intermittent constipation followed by diarrhea.  He denies any nausea or vomiting.  He denies any heartburn.  He denies any melena or hematochezia.  He has no history of pancreatitis.  At that time, my plan was: Differential diagnosis includes IBS versus small bowel bacterial overgrowth syndrome versus pancreatic insufficiency.  Try the patient on Flagyl 500 mg 3 times daily for 10 days and add a probiotic such as align.  If not improving, may try empiric treatment for pancreatic insufficiency given his weight loss and negative workup today.  06/02/23 Wt Readings from Last 3 Encounters:  06/02/23 185 lb (83.9 kg)  05/04/23 190 lb (86.2 kg)  04/18/23 197 lb (89.4 kg)   Patient  continues to lose weight.  Reports no appetite, early satiety.  Has bloating and nausea with food.  Has occasional diarrhea and occasional constipation.  Doesn't have desire to eat or to do the things he used to enjoy.  Under more stress caring for his wife.  Having trouble sleeping.   Past Medical History:  Diagnosis Date   Allergic rhinitis, cause unspecified    Allergy    Anxiety    Arthritis    Cancer (HCC)    a couple places removed from eyelid   Carotid Doppler 04/2020   Carotid US 9/21: No evidence of ICA stenosis bilaterally; right vertebral artery stenosis   Cataract    Chronic combined systolic (congestive) and diastolic (congestive) heart failure (HCC)    Coronary atherosclerosis of unspecified type of vessel, native or graft    a. MI 1985 with unclear details. b. CABG 03/2019 with LIMA -> LAD, SVG -> OM, SVG to dRCA.   Depression    PTSD    Diverticulosis of colon (without mention of hemorrhage)    Esophageal reflux    Esophageal stricture    Essential hypertension    Former tobacco use    Hemorrhoids    Hyperlipidemia    ICD (implantable cardioverter-defibrillator) in place    Irritable bowel syndrome    Ischemic cardiomyopathy    Mild carotid artery disease (HCC)    a. 1-39% bilaterally 03/2019 duplex.   Myocardial infarction (HCC)    Nocturia    Obesity, unspecified    Orthostasis    Other acquired absence of organ    Personal history of colonic polyps    Psychosexual dysfunction with inhibited sexual excitement    Retroperitoneal bleed    Type II or unspecified type diabetes mellitus without mention of complication, not stated as uncontrolled    VT (ventricular tachycardia) (HCC)    Past Surgical History:  Procedure Laterality Date   ADENOIDECTOMY     CARPAL TUNNEL RELEASE     left    CHOLECYSTECTOMY     COLON RESECTION     18 inches   COLON SURGERY  2013   CORONARY ANGIOPLASTY     1985    CORONARY ARTERY BYPASS GRAFT N/A 04/26/2019   Procedure:  CORONARY ARTERY BYPASS GRAFTING (CABG) x Three, using left internal mammary artery and right leg greater saphenous vein harvested endoscopically;  Surgeon: Alleen Borne, MD;  Location: [facility] OR;  Service: Open Heart Surgery;  Laterality: N/A;   ELBOW BURSA SURGERY     EYE SURGERY     ICD IMPLANT N/A 06/25/2020   Procedure: ICD IMPLANT;  Surgeon: Lanier Prude, MD;  Location: [facility] INVASIVE CV LAB;  Service: Cardiovascular;  Laterality: N/A;   LEFT HEART CATH AND CORS/GRAFTS ANGIOGRAPHY N/A 06/19/2020   Procedure: LEFT HEART CATH AND CORS/GRAFTS ANGIOGRAPHY;  Surgeon: Lennette Bihari, MD;  Location: [facility] INVASIVE CV LAB;  Service: Cardiovascular;  Laterality: N/A;  LEFT HEART CATH AND CORS/GRAFTS ANGIOGRAPHY N/A 03/08/2023   Procedure: LEFT HEART CATH AND CORS/GRAFTS ANGIOGRAPHY;  Surgeon: Iran Ouch, MD;  Location: [facility] INVASIVE CV LAB;  Service: Cardiovascular;  Laterality: N/A;   POLYPECTOMY     RIGHT/LEFT HEART CATH AND CORONARY ANGIOGRAPHY N/A 04/16/2019   Procedure: RIGHT/LEFT HEART CATH AND CORONARY ANGIOGRAPHY;  Surgeon: Lyn Records, MD;  Location: [facility] INVASIVE CV LAB;  Service: Cardiovascular;  Laterality: N/A;   SHOULDER SURGERY     Rt. Shoulder   TEE WITHOUT CARDIOVERSION N/A 04/26/2019   Procedure: TRANSESOPHAGEAL ECHOCARDIOGRAM (TEE);  Surgeon: Alleen Borne, MD;  Location: [facility] OR;  Service: Open Heart Surgery;  Laterality: N/A;   TONSILLECTOMY     V TACH ABLATION N/A 06/22/2020   Procedure: V TACH ABLATION;  Surgeon: Lanier Prude, MD;  Location: [facility] INVASIVE CV LAB;  Service: Cardiovascular;  Laterality: N/A;   Current Outpatient Medications on File Prior to Visit  Medication Sig Dispense Refill   acetaminophen (TYLENOL) 500 MG tablet Take 1,000 mg by mouth at bedtime as needed for mild pain.     aspirin EC 81 MG tablet Take 1 tablet (81 mg total) by mouth daily. 90 tablet 3   azelastine (ASTELIN) 0.1 % nasal spray Place 2 sprays into both nostrils 2 (two) times daily.  Use in each nostril as directed (Patient taking differently: Place 2 sprays into both nostrils daily as needed for allergies or rhinitis. Use in each nostril as directed) 30 mL 12   carvedilol (COREG) 6.25 MG tablet Take 0.5 tablets (3.125 mg total) by mouth 2 (two) times daily. 90 tablet 2   Cetirizine HCl (ZERVIATE) 0.24 % SOLN Apply 1 drop to eye 2 (two) times daily. 30 each 3   cholestyramine (QUESTRAN) 4 g packet Take 0.5 packets (2 g total) by mouth 2 (two) times daily. 60 each 3   citalopram (CELEXA) 10 MG tablet Take 1 tablet (10 mg total) by mouth daily. 90 tablet 1   diphenoxylate-atropine (LOMOTIL) 2.5-0.025 MG tablet Take 2 tablets by mouth 4 (four) times daily as needed for diarrhea or loose stools. 30 tablet 0   empagliflozin (JARDIANCE) 25 MG TABS tablet TAKE 25 MG (1 TABLET) BY MOUTH DAILY BEFORE BREAKFAST. STOP PIOGLITAZONE 90 tablet 0   fexofenadine (ALLEGRA) 180 MG tablet Take 1 tablet (180 mg total) by mouth daily as needed. For allergies (Patient taking differently: Take 180 mg by mouth daily. For allergies) 90 tablet 0   finasteride (PROSCAR) 5 MG tablet TAKE 1 TABLET BY MOUTH EVERY DAY 90 tablet 3   hydrocortisone (ANUSOL-HC) 2.5 % rectal cream Place 1 Application rectally at bedtime. (Patient taking differently: Place 1 Application rectally daily as needed for hemorrhoids or anal itching.) 30 g 1   montelukast (SINGULAIR) 10 MG tablet TAKE 1 TABLET BY MOUTH EVERYDAY AT BEDTIME 90 tablet 0   neomycin-polymyxin b-dexamethasone (MAXITROL) 3.5-10000-0.1 OINT Place 1 Application into both eyes 3 (three) times daily.     nystatin (MYCOSTATIN) 100000 UNIT/ML suspension Take 5 mLs (500,000 Units total) by mouth 4 (four) times daily. 60 mL 0   olopatadine (PATADAY) 0.1 % ophthalmic solution Place 1 drop into both eyes daily as needed for allergies.     pantoprazole (PROTONIX) 40 MG tablet Take 1 tablet (40 mg total) by mouth 2 (two) times daily. 60 tablet 11   rosuvastatin (CRESTOR) 40  MG tablet TAKE 1 TABLET BY MOUTH DAILY AT 6 PM. 90 tablet 3   sitaGLIPtin (JANUVIA)  50 MG tablet Take 1 tablet (50 mg total) by mouth daily. 90 tablet 3   spironolactone (ALDACTONE) 25 MG tablet Take 0.5 tablets (12.5 mg total) by mouth daily. 15 tablet 6   vitamin B-12 (CYANOCOBALAMIN) 100 MCG tablet Take 100 mcg by mouth daily.     No current facility-administered medications on file prior to visit.   Allergies  Allergen Reactions   Doxazosin Mesylate Other (See Comments)    Drops in blood pressure    Losartan     Other reaction(s): Muscle pain   Testosterone     Other reaction(s): Erythrocytosis Other reaction(s): Erythrocytosis   Flomax [Tamsulosin Hcl] Itching and Other (See Comments)    Drops in blood pressure     Penicillins Rash and Other (See Comments)    Has patient had a PCN reaction causing immediate rash, facial/tongue/throat swelling, SOB or lightheadedness with hypotension: No Has patient had a PCN reaction causing severe rash involving mucus membranes or skin necrosis: No Has patient had a PCN reaction that required hospitalization No Has patient had a PCN reaction occurring within the last 10 years: No If all of the above answers are "NO", then may proceed with Cephalosporin use.    Sulfonamide Derivatives Rash   Trulicity [Dulaglutide] Nausea And Vomiting    N&V, Dizziness   Social History   Socioeconomic History   Marital status: Married    Spouse name: Not on file   Number of children: 1   Years of education: 12   Highest education level: High school graduate  Occupational History   Occupation: Therapist, sports- retired  Tobacco Use   Smoking status: Former    Current packs/day: 0.00    Types: Cigarettes    Quit date: 08/30/1983    Years since quitting: 39.7   Smokeless tobacco: Former   Tobacco comments:    Quit 30 years ago.  Vaping Use   Vaping status: Never Used  Substance and Sexual Activity   Alcohol use: Yes    Comment: rare   Drug use: No   Sexual  activity: Yes  Other Topics Concern   Not on file  Social History Narrative   Not on file   Social Determinants of Health   Financial Resource Strain: Low Risk  (12/22/2022)   Overall Financial Resource Strain (CARDIA)    Difficulty of Paying Living Expenses: Not hard at all  Food Insecurity: No Food Insecurity (12/22/2022)   Hunger Vital Sign    Worried About Running Out of Food in the Last Year: Never true    Ran Out of Food in the Last Year: Never true  Transportation Needs: No Transportation Needs (12/22/2022)   PRAPARE - Administrator, Civil Service (Medical): No    Lack of Transportation (Non-Medical): No  Physical Activity: Insufficiently Active (12/22/2022)   Exercise Vital Sign    Days of Exercise per Week: 3 days    Minutes of Exercise per Session: 30 min  Stress: No Stress Concern Present (12/22/2022)   [employer] of Occupational Health - Occupational Stress Questionnaire    Feeling of Stress : Not at all  Social Connections: Moderately Isolated (12/22/2022)   Social Connection and Isolation Panel [NHANES]    Frequency of Communication with Friends and Family: More than three times a week    Frequency of Social Gatherings with Friends and Family: More than three times a week    Attends Religious Services: Never    Database administrator or Organizations: No  Attends Banker Meetings: Never    Marital Status: Married  Catering manager Violence: Not At Risk (12/22/2022)   Humiliation, Afraid, Rape, and Kick questionnaire    Fear of Current or Ex-Partner: No    Emotionally Abused: No    Physically Abused: No    Sexually Abused: No     Review of Systems     Objective:   Physical Exam Vitals reviewed.  Constitutional:      General: He is not in acute distress.    Appearance: Normal appearance. He is obese. He is not ill-appearing or toxic-appearing.  HENT:     Right Ear: Tympanic membrane and ear canal normal.     Left Ear:  Tympanic membrane and ear canal normal.     Nose: Nose normal.     Mouth/Throat:     Mouth: Mucous membranes are moist.  Eyes:     General: Lids are normal.     Extraocular Movements:     Right eye: Normal extraocular motion.     Left eye: Normal extraocular motion.     Conjunctiva/sclera:     Left eye: Left conjunctiva is not injected.  Cardiovascular:     Rate and Rhythm: Normal rate and regular rhythm.     Pulses: Normal pulses.     Heart sounds: Normal heart sounds. No murmur heard.    No friction rub. No gallop.  Pulmonary:     Effort: Pulmonary effort is normal. No respiratory distress.     Breath sounds: Normal breath sounds. No stridor. No wheezing, rhonchi or rales.  Abdominal:     General: Abdomen is flat. Bowel sounds are normal. There is no distension.     Palpations: Abdomen is soft. There is no mass.     Tenderness: There is no abdominal tenderness. There is no guarding or rebound.     Hernia: No hernia is present.  Musculoskeletal:     Right lower leg: No edema.     Left lower leg: No edema.     Right foot: Normal range of motion and normal capillary refill. Tenderness present. No swelling, prominent metatarsal heads or crepitus.     Left foot: Normal range of motion and normal capillary refill. No swelling, tenderness or crepitus.  Skin:    Findings: No rash.  Neurological:     General: No focal deficit present.     Mental Status: He is alert and oriented to person, place, and time. Mental status is at baseline.  Psychiatric:        Mood and Affect: Mood normal.        Behavior: Behavior normal.        Thought Content: Thought content normal.        Judgment: Judgment normal.           Assessment & Plan:  Hemorrhoids, unspecified hemorrhoid type - Plan: hydrocortisone (ANUSOL-HC) 2.5 % rectal cream, DISCONTINUED: hydrocortisone (ANUSOL-HC) 2.5 % rectal cream  Coronary artery disease involving native coronary artery of native heart without angina  pectoris - Plan: citalopram (CELEXA) 10 MG tablet  CAD in native artery - Plan: citalopram (CELEXA) 10 MG tablet  Chronic combined systolic and diastolic CHF (congestive heart failure) (HCC) - Plan: citalopram (CELEXA) 10 MG tablet  Ventricular tachycardia (HCC) - Plan: citalopram (CELEXA) 10 MG tablet  Mixed hyperlipidemia - Plan: citalopram (CELEXA) 10 MG tablet  Essential hypertension - Plan: citalopram (CELEXA) 10 MG tablet  Hyperlipidemia LDL goal <70 - Plan: citalopram (CELEXA) 10  MG tablet   Biggest concern is weight loss.  DDx could be pancreatic insufficiency vs depression.  Try zenpep 6000 poqac to see if symptoms improve and also start celexa 10 mg poqday for anhedonia and poor appetite.  If symptoms improve, I will try to tease out which intervention helped the most.

## 2023-06-16 DIAGNOSIS — E119 Type 2 diabetes mellitus without complications: Secondary | ICD-10-CM | POA: Diagnosis not present

## 2023-06-19 ENCOUNTER — Ambulatory Visit (HOSPITAL_COMMUNITY): Payer: Medicare HMO | Attending: Physician Assistant

## 2023-06-19 DIAGNOSIS — I10 Essential (primary) hypertension: Secondary | ICD-10-CM | POA: Diagnosis not present

## 2023-06-19 DIAGNOSIS — I5042 Chronic combined systolic (congestive) and diastolic (congestive) heart failure: Secondary | ICD-10-CM | POA: Diagnosis not present

## 2023-06-19 DIAGNOSIS — I2510 Atherosclerotic heart disease of native coronary artery without angina pectoris: Secondary | ICD-10-CM | POA: Diagnosis not present

## 2023-06-19 LAB — ECHOCARDIOGRAM COMPLETE
AR max vel: 0.94 cm2
AV Area VTI: 0.96 cm2
AV Area mean vel: 0.92 cm2
AV Mean grad: 9 mm[Hg]
AV Peak grad: 17.2 mm[Hg]
Ao pk vel: 2.07 m/s
S' Lateral: 3.7 cm

## 2023-06-22 ENCOUNTER — Ambulatory Visit (INDEPENDENT_AMBULATORY_CARE_PROVIDER_SITE_OTHER): Payer: Medicare HMO

## 2023-06-22 DIAGNOSIS — I472 Ventricular tachycardia, unspecified: Secondary | ICD-10-CM | POA: Diagnosis not present

## 2023-06-22 LAB — CUP PACEART REMOTE DEVICE CHECK
Battery Remaining Longevity: 115 mo
Battery Voltage: 3.01 V
Brady Statistic RV Percent Paced: 0.05 %
Date Time Interrogation Session: 20241024022502
HighPow Impedance: 71 Ohm
Implantable Lead Connection Status: 753985
Implantable Lead Implant Date: 20211028
Implantable Lead Location: 753860
Implantable Pulse Generator Implant Date: 20211028
Lead Channel Impedance Value: 380 Ohm
Lead Channel Impedance Value: 456 Ohm
Lead Channel Pacing Threshold Amplitude: 0.625 V
Lead Channel Pacing Threshold Pulse Width: 0.4 ms
Lead Channel Sensing Intrinsic Amplitude: 11.375 mV
Lead Channel Sensing Intrinsic Amplitude: 11.375 mV
Lead Channel Setting Pacing Amplitude: 2 V
Lead Channel Setting Pacing Pulse Width: 0.4 ms
Lead Channel Setting Sensing Sensitivity: 0.3 mV
Zone Setting Status: 755011
Zone Setting Status: 755011

## 2023-07-11 NOTE — Progress Notes (Signed)
Remote ICD transmission.   

## 2023-07-18 ENCOUNTER — Telehealth: Payer: Self-pay

## 2023-07-18 NOTE — Telephone Encounter (Signed)
LM for pt to call office to discuss lab results from [place]. Copy of results on my desk. Mjp,lpn

## 2023-07-19 ENCOUNTER — Ambulatory Visit: Payer: Medicare HMO | Admitting: Adult Health

## 2023-07-20 ENCOUNTER — Encounter: Payer: Self-pay | Admitting: Family Medicine

## 2023-07-20 ENCOUNTER — Ambulatory Visit: Payer: Medicare HMO | Admitting: Family Medicine

## 2023-07-20 DIAGNOSIS — R140 Abdominal distension (gaseous): Secondary | ICD-10-CM | POA: Diagnosis not present

## 2023-07-20 MED ORDER — RIFAXIMIN 550 MG PO TABS: 550.0000 mg | ORAL_TABLET | Freq: Two times a day (BID) | ORAL | 0 refills | Status: DC

## 2023-07-20 NOTE — Progress Notes (Signed)
Subjective:    Patient ID: Cameron Daniel, male    DOB: October 01, 1945, 77 y.o.   MRN: [id number]  GI Problem   08/02/22 Patient is a 77 year old Caucasian gentleman who reports several weeks of bloating and abdominal discomfort.  He states that he is having early satiety.  He states that he will eat very little and suddenly feel extremely ill and start developing.  He has a lot of upper abdominal gas and distention per his report.  He also feels nauseated.  He denies any vomiting.  He denies any hematemesis.  He denies any dysphagia.  He denies any food getting stuck in his throat.  However he states that he feels like he eats 1 or 2 bites and cannot eat anymore due to bloating and distention.  He does occasionally have constipation but he still having bowel movements almost on a daily basis.  He denies any melena or hematochezia.  He denies any diarrhea.  He denies any fevers or chills.  He denies any abdominal pain.  He denies any significant weight loss.  At that time, my plan was:  Differential diagnosis is broad.  First am concerned about possible gastroparesis given his longstanding history of diabetes as well as his neuropathy in his feet.  Therefore we will try the patient empirically on Reglan 10 mg 3 times a day for a week and see if that helps.  He is already on twice daily Protonix and Pepcid so I do not feel that this is acid reflux.  I would be concerned about gastric outlet obstruction.  So if symptoms or not improving, consider a GI referral for EGD.  Patient has recently had a colonoscopy.  However he would be at risk for possible constipation causing bloating and abdominal distention so I will send the patient for abdominal x-ray to evaluate for an elevated stool burden and if present, we will try to facilitate with a better stool regimen.  08/11/22 Abdominal x-ray was unremarkable.  Patient tried MiraLAX for 4 to 5 days and had several good bowel movements however he continues to  report abdominal bloating, increased gas and burping, and early satiety.  He states that he will eat 1 or 2 bites and get full.  He denies any vomiting.  He denies any abdominal pain.  He denies any fevers or chills.  He is already on twice daily Protonix.  05/04/23 CT 2/24 was clear: IMPRESSION: No acute findings.   Further decrease in size of right retroperitoneal fat attenuation lesion, consistent with residual changes from previously demonstrated retroperitoneal hematoma at this site.   Colonic diverticulosis, without radiographic evidence of diverticulitis.   Stable small right inguinal hernia, which contains only fat.   Aortic Atherosclerosis (ICD10-I70.0).  GI performed egd and biopsies were unremarkable.  Did find esophageal stricture.  GI tried metronidazole for possible small bowel overgrowth In February which seemed to help per Dr. Lamar Sprinkles last note.   Wt Readings from Last 3 Encounters:  07/20/23 188 lb 3.2 oz (85.4 kg)  06/02/23 185 lb (83.9 kg)  05/04/23 190 lb (86.2 kg)   Continues to lose weight,  Was 220 lbs in 9/23.  Patient states that he was doing better when he last saw GI however recently, he has developed early satiety again.  He reports bloating.  He reports excessive gas.  He reports intermittent constipation followed by diarrhea.  He denies any nausea or vomiting.  He denies any heartburn.  He denies any melena or hematochezia.  He has no history of pancreatitis.  At that time, my plan was: Differential diagnosis includes IBS versus small bowel bacterial overgrowth syndrome versus pancreatic insufficiency.  Try the patient on Flagyl 500 mg 3 times daily for 10 days and add a probiotic such as align.  If not improving, may try empiric treatment for pancreatic insufficiency given his weight loss and negative workup today.  06/02/23 Wt Readings from Last 3 Encounters:  07/20/23 188 lb 3.2 oz (85.4 kg)  06/02/23 185 lb (83.9 kg)  05/04/23 190 lb (86.2 kg)   Patient  continues to lose weight.  Reports no appetite, early satiety.  Has bloating and nausea with food.  Has occasional diarrhea and occasional constipation.  Doesn't have desire to eat or to do the things he used to enjoy.  Under more stress caring for his wife.  Having trouble sleeping.  At that time, my plan was:  Biggest concern is weight loss.  DDx could be pancreatic insufficiency vs depression.  Try zenpep 6000 poqac to see if symptoms improve and also start celexa 10 mg poqday for anhedonia and poor appetite.  If symptoms improve, I will try to tease out which intervention helped the most.   07/20/23 Patient tried zenpep 3 different times.  He states that each time he tried it, he had abdominal pain shortly after taking it.  Therefore he discontinued the medication after 3 separate attempts.  He continues to report increased belching and gas.  This seems to be his primary concern today.  His weight loss has stabilized.  He no longer is reporting early satiety.  He does not complain of a poor appetite.  He continues to have occasional diarrhea for which he takes cholestyramine.  He denies depression.  He does not think that depression is causing his bloating and gas.  Patient would like to see GI for a second opinion.  So far the only treat provided any relief was metronidazole.  However this was temporary Past Medical History:  Diagnosis Date   Allergic rhinitis, cause unspecified    Allergy    Anxiety    Arthritis    Cancer (HCC)    a couple places removed from eyelid   Carotid Doppler 04/2020   Carotid US 9/21: No evidence of ICA stenosis bilaterally; right vertebral artery stenosis   Cataract    Chronic combined systolic (congestive) and diastolic (congestive) heart failure (HCC)    Coronary atherosclerosis of unspecified type of vessel, native or graft    a. MI 1985 with unclear details. b. CABG 03/2019 with LIMA -> LAD, SVG -> OM, SVG to dRCA.   Depression    PTSD    Diverticulosis of  colon (without mention of hemorrhage)    Esophageal reflux    Esophageal stricture    Essential hypertension    Former tobacco use    Hemorrhoids    Hyperlipidemia    ICD (implantable cardioverter-defibrillator) in place    Irritable bowel syndrome    Ischemic cardiomyopathy    Mild carotid artery disease (HCC)    a. 1-39% bilaterally 03/2019 duplex.   Myocardial infarction (HCC)    Nocturia    Obesity, unspecified    Orthostasis    Other acquired absence of organ    Personal history of colonic polyps    Psychosexual dysfunction with inhibited sexual excitement    Retroperitoneal bleed    Type II or unspecified type diabetes mellitus without mention of complication, not stated as uncontrolled  VT (ventricular tachycardia) (HCC)    Past Surgical History:  Procedure Laterality Date   ADENOIDECTOMY     CARPAL TUNNEL RELEASE     left    CHOLECYSTECTOMY     COLON RESECTION     18 inches   COLON SURGERY  2013   CORONARY ANGIOPLASTY     1985    CORONARY ARTERY BYPASS GRAFT N/A 04/26/2019   Procedure: CORONARY ARTERY BYPASS GRAFTING (CABG) x Three, using left internal mammary artery and right leg greater saphenous vein harvested endoscopically;  Surgeon: Alleen Borne, MD;  Location: [facility] OR;  Service: Open Heart Surgery;  Laterality: N/A;   ELBOW BURSA SURGERY     EYE SURGERY     ICD IMPLANT N/A 06/25/2020   Procedure: ICD IMPLANT;  Surgeon: Lanier Prude, MD;  Location: [name] INVASIVE CV LAB;  Service: Cardiovascular;  Laterality: N/A;   LEFT HEART CATH AND CORS/GRAFTS ANGIOGRAPHY N/A 06/19/2020   Procedure: LEFT HEART CATH AND CORS/GRAFTS ANGIOGRAPHY;  Surgeon: Lennette Bihari, MD;  Location: [facility] INVASIVE CV LAB;  Service: Cardiovascular;  Laterality: N/A;   LEFT HEART CATH AND CORS/GRAFTS ANGIOGRAPHY N/A 03/08/2023   Procedure: LEFT HEART CATH AND CORS/GRAFTS ANGIOGRAPHY;  Surgeon: Iran Ouch, MD;  Location: [facility] INVASIVE CV LAB;  Service: Cardiovascular;  Laterality: N/A;    POLYPECTOMY     RIGHT/LEFT HEART CATH AND CORONARY ANGIOGRAPHY N/A 04/16/2019   Procedure: RIGHT/LEFT HEART CATH AND CORONARY ANGIOGRAPHY;  Surgeon: Lyn Records, MD;  Location: [facility] INVASIVE CV LAB;  Service: Cardiovascular;  Laterality: N/A;   SHOULDER SURGERY     Rt. Shoulder   TEE WITHOUT CARDIOVERSION N/A 04/26/2019   Procedure: TRANSESOPHAGEAL ECHOCARDIOGRAM (TEE);  Surgeon: Alleen Borne, MD;  Location: [facility] OR;  Service: Open Heart Surgery;  Laterality: N/A;   TONSILLECTOMY     V TACH ABLATION N/A 06/22/2020   Procedure: V TACH ABLATION;  Surgeon: Lanier Prude, MD;  Location: [facility] INVASIVE CV LAB;  Service: Cardiovascular;  Laterality: N/A;   Current Outpatient Medications on File Prior to Visit  Medication Sig Dispense Refill   acetaminophen (TYLENOL) 500 MG tablet Take 1,000 mg by mouth at bedtime as needed for mild pain.     aspirin EC 81 MG tablet Take 1 tablet (81 mg total) by mouth daily. 90 tablet 3   azelastine (ASTELIN) 0.1 % nasal spray Place 2 sprays into both nostrils 2 (two) times daily. Use in each nostril as directed (Patient taking differently: Place 2 sprays into both nostrils daily as needed for allergies or rhinitis. Use in each nostril as directed) 30 mL 12   carvedilol (COREG) 6.25 MG tablet Take 0.5 tablets (3.125 mg total) by mouth 2 (two) times daily. 90 tablet 2   Cetirizine HCl (ZERVIATE) 0.24 % SOLN Apply 1 drop to eye 2 (two) times daily. 30 each 3   cholestyramine (QUESTRAN) 4 g packet Take 0.5 packets (2 g total) by mouth 2 (two) times daily. 60 each 3   citalopram (CELEXA) 10 MG tablet Take 1 tablet (10 mg total) by mouth daily. 90 tablet 1   diphenoxylate-atropine (LOMOTIL) 2.5-0.025 MG tablet Take 2 tablets by mouth 4 (four) times daily as needed for diarrhea or loose stools. 30 tablet 0   empagliflozin (JARDIANCE) 25 MG TABS tablet TAKE 25 MG (1 TABLET) BY MOUTH DAILY BEFORE BREAKFAST. STOP PIOGLITAZONE 90 tablet 0   fexofenadine (ALLEGRA) 180 MG  tablet Take 1 tablet (180 mg total) by mouth daily as  needed. For allergies (Patient taking differently: Take 180 mg by mouth daily. For allergies) 90 tablet 0   finasteride (PROSCAR) 5 MG tablet TAKE 1 TABLET BY MOUTH EVERY DAY 90 tablet 3   hydrocortisone (ANUSOL-HC) 2.5 % rectal cream Place 1 Application rectally daily as needed for hemorrhoids or anal itching. 30 g 1   montelukast (SINGULAIR) 10 MG tablet TAKE 1 TABLET BY MOUTH EVERYDAY AT BEDTIME 90 tablet 0   neomycin-polymyxin b-dexamethasone (MAXITROL) 3.5-10000-0.1 OINT Place 1 Application into both eyes 3 (three) times daily.     nystatin (MYCOSTATIN) 100000 UNIT/ML suspension Take 5 mLs (500,000 Units total) by mouth 4 (four) times daily. 60 mL 0   olopatadine (PATADAY) 0.1 % ophthalmic solution Place 1 drop into both eyes daily as needed for allergies.     pantoprazole (PROTONIX) 40 MG tablet Take 1 tablet (40 mg total) by mouth 2 (two) times daily. 60 tablet 11   rosuvastatin (CRESTOR) 40 MG tablet TAKE 1 TABLET BY MOUTH DAILY AT 6 PM. 90 tablet 3   sitaGLIPtin (JANUVIA) 50 MG tablet Take 1 tablet (50 mg total) by mouth daily. 90 tablet 3   spironolactone (ALDACTONE) 25 MG tablet Take 0.5 tablets (12.5 mg total) by mouth daily. 15 tablet 6   vitamin B-12 (CYANOCOBALAMIN) 100 MCG tablet Take 100 mcg by mouth daily.     No current facility-administered medications on file prior to visit.   Allergies  Allergen Reactions   Doxazosin Mesylate Other (See Comments)    Drops in blood pressure    Losartan     Other reaction(s): Muscle pain   Testosterone     Other reaction(s): Erythrocytosis Other reaction(s): Erythrocytosis   Flomax [Tamsulosin Hcl] Itching and Other (See Comments)    Drops in blood pressure     Penicillins Rash and Other (See Comments)    Has patient had a PCN reaction causing immediate rash, facial/tongue/throat swelling, SOB or lightheadedness with hypotension: No Has patient had a PCN reaction causing severe  rash involving mucus membranes or skin necrosis: No Has patient had a PCN reaction that required hospitalization No Has patient had a PCN reaction occurring within the last 10 years: No If all of the above answers are "NO", then may proceed with Cephalosporin use.    Sulfonamide Derivatives Rash   Trulicity [Dulaglutide] Nausea And Vomiting    N&V, Dizziness   Social History   Socioeconomic History   Marital status: Married    Spouse name: Not on file   Number of children: 1   Years of education: 12   Highest education level: High school graduate  Occupational History   Occupation: Therapist, sports- retired  Tobacco Use   Smoking status: Former    Current packs/day: 0.00    Types: Cigarettes    Quit date: 08/30/1983    Years since quitting: 39.9   Smokeless tobacco: Former   Tobacco comments:    Quit 30 years ago.  Vaping Use   Vaping status: Never Used  Substance and Sexual Activity   Alcohol use: Yes    Comment: rare   Drug use: No   Sexual activity: Yes  Other Topics Concern   Not on file  Social History Narrative   Not on file   Social Determinants of Health   Financial Resource Strain: Low Risk  (12/22/2022)   Overall Financial Resource Strain (CARDIA)    Difficulty of Paying Living Expenses: Not hard at all  Food Insecurity: No Food Insecurity (12/22/2022)   Hunger  Vital Sign    Worried About Programme researcher, broadcasting/film/video in the Last Year: Never true    Ran Out of Food in the Last Year: Never true  Transportation Needs: No Transportation Needs (12/22/2022)   PRAPARE - Administrator, Civil Service (Medical): No    Lack of Transportation (Non-Medical): No  Physical Activity: Insufficiently Active (12/22/2022)   Exercise Vital Sign    Days of Exercise per Week: 3 days    Minutes of Exercise per Session: 30 min  Stress: No Stress Concern Present (12/22/2022)   [employer] of Occupational Health - Occupational Stress Questionnaire    Feeling of Stress : Not at all   Social Connections: Moderately Isolated (12/22/2022)   Social Connection and Isolation Panel [NHANES]    Frequency of Communication with Friends and Family: More than three times a week    Frequency of Social Gatherings with Friends and Family: More than three times a week    Attends Religious Services: Never    Database administrator or Organizations: No    Attends Banker Meetings: Never    Marital Status: Married  Catering manager Violence: Not At Risk (12/22/2022)   Humiliation, Afraid, Rape, and Kick questionnaire    Fear of Current or Ex-Partner: No    Emotionally Abused: No    Physically Abused: No    Sexually Abused: No     Review of Systems     Objective:   Physical Exam Vitals reviewed.  Constitutional:      General: He is not in acute distress.    Appearance: Normal appearance. He is obese. He is not ill-appearing or toxic-appearing.  HENT:     Right Ear: Tympanic membrane and ear canal normal.     Left Ear: Tympanic membrane and ear canal normal.     Nose: Nose normal.     Mouth/Throat:     Mouth: Mucous membranes are moist.  Eyes:     General: Lids are normal.     Extraocular Movements:     Right eye: Normal extraocular motion.     Left eye: Normal extraocular motion.     Conjunctiva/sclera:     Left eye: Left conjunctiva is not injected.  Cardiovascular:     Rate and Rhythm: Normal rate and regular rhythm.     Pulses: Normal pulses.     Heart sounds: Normal heart sounds. No murmur heard.    No friction rub. No gallop.  Pulmonary:     Effort: Pulmonary effort is normal. No respiratory distress.     Breath sounds: Normal breath sounds. No stridor. No wheezing, rhonchi or rales.  Abdominal:     General: Abdomen is flat. Bowel sounds are normal. There is no distension.     Palpations: Abdomen is soft. There is no mass.     Tenderness: There is no abdominal tenderness. There is no guarding or rebound.     Hernia: No hernia is present.   Musculoskeletal:     Right lower leg: No edema.     Left lower leg: No edema.     Right foot: Normal range of motion and normal capillary refill. Tenderness present. No swelling, prominent metatarsal heads or crepitus.     Left foot: Normal range of motion and normal capillary refill. No swelling, tenderness or crepitus.  Skin:    Findings: No rash.  Neurological:     General: No focal deficit present.     Mental Status: He is  alert and oriented to person, place, and time. Mental status is at baseline.  Psychiatric:        Mood and Affect: Mood normal.        Behavior: Behavior normal.        Thought Content: Thought content normal.        Judgment: Judgment normal.           Assessment & Plan:  Hypercalcemia - Plan: PTH, Intact (ICMA) and Ionized Calcium  Bloating  Abdominal distention Recent labs from the [place] showed a calcium level greater than 11.  Therefore I will check a PTH and repeat his calcium level to evaluate for hyperparathyroidism.  At this point, I have no further ideas to try for his bloating.  I feel the majority of this may be IBS versus small bowel bacterial overgrowth.  Empiric treatment for pancreatic insufficiency did not work.  Therefore I will try the patient on rifaximin 550 mg twice daily for 14 days.  I will consult GI as requested for a second opinion

## 2023-07-21 NOTE — Progress Notes (Unsigned)
Cardiology Office Note:  .   Date:  07/24/2023  ID:  Saunders, Heinzman [birth year], MRN [id number] PCP: Donita Brooks, MD  [facility] Providers Cardiologist:  Needs cardiologist (formerly Shari Prows)  Electrophysiologist:  Lanier Prude, MD  Advanced Heart Failure:  Arvilla Meres, MD {  History of Present Illness: SAVAS SUGARMAN is a 77 y.o. male 77 y.o. male with a hx of CAD s/p CABG, -LIMA-LAD, SVG-OM, SVG-dRCA (04/26/2019) VT s/p ablation and  ICD, Chronic combined CHF (35%-40%) HTN, HLD On last office visit, carvedilol was reduced to 3.125 BID with plans to start aldactone 12.5 mg daily, and continued on Jardiance.   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  Has a lot of health issues associated with it.   He denies any chest pain, no significant dyspnea on exertion, no issues with edema or volume overload currently.  He is medically compliant and gets his medications from the [place].  ROS: As above otherwise negative  Studies Reviewed: .   Echocardiogram 06/19/2023 1. Left ventricular ejection fraction, by estimation, is 35 to 40%. The  left ventricle has moderately decreased function. The left ventricle  demonstrates regional wall motion abnormalities with basal to mid  inferolateral akinesis and severe inferior  hypokinesis. There is moderate concentric left ventricular hypertrophy.  Left ventricular diastolic parameters are consistent with Grade II  diastolic dysfunction (pseudonormalization).   2. Right ventricular systolic function is mildly reduced. The right  ventricular size is normal. There is normal pulmonary artery systolic  pressure. The estimated right ventricular systolic pressure is 23.5 mmHg.   3. Left atrial size was mildly dilated.   4. Right atrial size was mildly dilated.   5. The mitral valve is degenerative. Trivial mitral valve regurgitation.   Moderate mitral annular calcification.   6. The aortic valve is tricuspid. There is severe calcifcation of the  aortic valve. Aortic valve regurgitation is not visualized. Probably  moderate low flow/low gradient aortic valve stenosis. Mean gradient 9 mmHg  but AVA by continuity equation is about   1 cm^2. Visually, at least moderate AS.   7. The inferior vena cava is normal in size with <50% respiratory  variability, suggesting right atrial pressure of 8 mmHg.     Physical Exam:   VS:  BP 116/70 (BP Location: Left Arm, Patient Position: Sitting, Cuff Size: Large)   Pulse 72   Ht 5\' 10"  (1.778 m)   Wt 184 lb (83.5 kg)   SpO2 98%   BMI 26.40 kg/m    Wt Readings from Last 3 Encounters:  07/24/23 184 lb (83.5 kg)  07/20/23 188 lb 3.2 oz (85.4 kg)  06/02/23 185 lb (83.9 kg)    GEN: Well nourished, well developed in no acute distress NECK: No JVD; No carotid bruits CARDIAC: RRR, 2/6 systolic murmurs, heard best at the right sternal border, and soft murmur in the apex.  No rubs, gallops RESPIRATORY:  Clear to auscultation without rales, wheezing or rhonchi  ABDOMEN: Soft, non-tender, non-distended EXTREMITIES:  No edema; No deformity   ASSESSMENT AND PLAN: .    Chronic systolic CHF: He is euvolemic currently.  He is tolerating medication regimen with carvedilol 6.25 mg twice daily, and spironolactone 12.5 mg daily.  He is currently not on SGLT inhibitor.  Weight has been essentially stable.  He states he feels as if he  has been losing weight.  He is not eating as much as he normally eats and is not as active.]  2.  Coronary artery disease: History of coronary artery bypass grafting in 2020.  He is doing well with secondary prevention, heart rate control, blood pressure management, lipid management, and purposeful exercise.  He would like to exercise more but does not want to overdo causing problems with his reduced EF.  He states that his back bothers him a lot he like to be able to  move around more to help to release it.  Walking is okay but nothing that is over exertional or causing him to have to lift heavy weights is recommended.  Leg stretches and riding a stationary bike would be okay if he did not allow his heart rate to be too elevated greater than 120 bpm.  He verbalizes understanding.  3.  Hypertension: Optimal for current ejection fraction.  He is not having any issues with dizziness or near syncope on this dose of carvedilol and spironolactone.  4.  ICD in situ: Remote checks per protocol.         Signed, Bettey Mare. Liborio Nixon, ANP, AACC

## 2023-07-22 LAB — PTH, INTACT (ICMA) AND IONIZED CALCIUM
Calcium, Ion: 5.9 mg/dL — ABNORMAL HIGH (ref 4.7–5.5)
Calcium: 11.3 mg/dL — ABNORMAL HIGH (ref 8.6–10.3)
PTH: 115 pg/mL — ABNORMAL HIGH (ref 16–77)

## 2023-07-24 ENCOUNTER — Ambulatory Visit: Payer: Medicare HMO | Attending: Adult Health | Admitting: Adult Health

## 2023-07-24 ENCOUNTER — Encounter: Payer: Self-pay | Admitting: Adult Health

## 2023-07-24 VITALS — BP 116/70 | HR 72 | Ht 70.0 in | Wt 184.0 lb

## 2023-07-24 DIAGNOSIS — I5021 Acute systolic (congestive) heart failure: Secondary | ICD-10-CM

## 2023-07-24 NOTE — Patient Instructions (Addendum)
Medication Instructions:  No Changes *If you need a refill on your cardiac medications before your next appointment, please call your pharmacy*   Lab Work: No labs If you have labs (blood work) drawn today and your tests are completely normal, you will receive your results only by: MyChart Message (if you have MyChart) OR A paper copy in the mail If you have any lab test that is abnormal or we need to change your treatment, we will call you to review the results.   Testing/Procedures: No Testing   Follow-Up: At [facility], you and your health needs are our priority.  As part of our continuing mission to provide you with exceptional heart care, we have created designated Provider Care Teams.  These Care Teams include your primary Cardiologist (physician) and Advanced Practice Providers (APPs -  Physician Assistants and Nurse Practitioners) who all work together to provide you with the care you need, when you need it.  We recommend signing up for the patient portal called "MyChart".  Sign up information is provided on this After Visit Summary.  MyChart is used to connect with patients for Virtual Visits (Telemedicine).  Patients are able to view lab/test results, encounter notes, upcoming appointments, etc.  Non-urgent messages can be sent to your provider as well.   To learn more about what you can do with MyChart, go to [email or web address].    Your next appointment:   6 month(s)  Provider:   Lenna Gilford. Flora Lipps, MD

## 2023-07-25 ENCOUNTER — Other Ambulatory Visit: Payer: Self-pay

## 2023-07-25 DIAGNOSIS — E210 Primary hyperparathyroidism: Secondary | ICD-10-CM

## 2023-08-01 ENCOUNTER — Other Ambulatory Visit: Payer: Self-pay | Admitting: Family Medicine

## 2023-08-01 MED ORDER — CLOTRIMAZOLE 1 % EX CREA: 1.0000 | TOPICAL_CREAM | Freq: Three times a day (TID) | CUTANEOUS | 1 refills | Status: DC

## 2023-08-02 ENCOUNTER — Telehealth: Payer: Self-pay

## 2023-08-02 NOTE — Telephone Encounter (Signed)
Cameron Daniel (Key: [id number])  Your information has been submitted to [employer]. [employer] will review the request and will issue a decision, typically within 1-3 days from your submission. You can check the updated outcome later by reopening this request.  If [employer] has not responded in 1-3 days or if you have any questions about your ePA request, please contact [employer] at [phone]. If you think there may be a problem with your PA request, use our live chat feature at the bottom right.

## 2023-08-03 NOTE — Telephone Encounter (Addendum)
PA-Xifaxan has been approved.  LVM per DPR re meds

## 2023-08-08 ENCOUNTER — Telehealth: Payer: Self-pay

## 2023-08-08 NOTE — Telephone Encounter (Signed)
Copied from CRM [id number]. Topic: Clinical - Medication Question >> Aug 07, 2023  4:35 PM Joanette Gula wrote: Reason for CRM: York Spaniel to let Germaine Pomfret or Elijah Birk know that he did contact the endocrinologists but it'll be about two weeks out before contacting to make an appointment.

## 2023-08-09 ENCOUNTER — Telehealth: Payer: Self-pay

## 2023-08-09 NOTE — Telephone Encounter (Signed)
Copied from CRM [id number]. Topic: Referral - Status >> Aug 09, 2023  2:33 PM Larwance Sachs wrote: Reason for CRM: Patient called in regarding a referral that was sent in over 10 days ago, states place reach out regarding scheduling appointment and stated it would be about 2 weeks out. Patient requested Germaine Pomfret call him back at [phone]

## 2023-08-15 ENCOUNTER — Telehealth: Payer: Self-pay

## 2023-08-15 ENCOUNTER — Other Ambulatory Visit: Payer: Self-pay

## 2023-08-15 DIAGNOSIS — E782 Mixed hyperlipidemia: Secondary | ICD-10-CM

## 2023-08-15 MED ORDER — CHOLESTYRAMINE 4 G PO PACK: 2.0000 g | PACK | Freq: Two times a day (BID) | ORAL | 3 refills | Status: DC

## 2023-08-15 NOTE — Telephone Encounter (Signed)
Prescription Request  08/15/2023  LOV: 07/20/23  What is the name of the medication or equipment? cholestyramine (QUESTRAN) 4 g packet [[id number]]   Have you contacted your pharmacy to request a refill? Yes   Which pharmacy would you like this sent to?  [facility] [address], [place] - [address] [place] [address] Phone: [phone] Fax: [phone]    Patient notified that their request is being sent to the clinical staff for review and that they should receive a response within 2 business days.   Please advise at [facility] [phone]

## 2023-08-16 ENCOUNTER — Ambulatory Visit: Payer: Medicare HMO | Admitting: Physician Assistant

## 2023-08-16 ENCOUNTER — Encounter: Payer: Self-pay | Admitting: Physician Assistant

## 2023-08-16 VITALS — BP 110/60 | Ht 71.0 in | Wt 186.0 lb

## 2023-08-16 DIAGNOSIS — R140 Abdominal distension (gaseous): Secondary | ICD-10-CM

## 2023-08-16 DIAGNOSIS — K219 Gastro-esophageal reflux disease without esophagitis: Secondary | ICD-10-CM

## 2023-08-16 DIAGNOSIS — R142 Eructation: Secondary | ICD-10-CM | POA: Diagnosis not present

## 2023-08-16 DIAGNOSIS — R194 Change in bowel habit: Secondary | ICD-10-CM

## 2023-08-16 MED ORDER — METRONIDAZOLE 250 MG PO TABS: 250.0000 mg | ORAL_TABLET | Freq: Three times a day (TID) | ORAL | 0 refills | Status: DC

## 2023-08-16 NOTE — Progress Notes (Signed)
Chief Complaint: Gas, burping and alternating bowel habits  HPI:    Cameron Daniel is a 77 year old male, known to Dr. Marina Goodell, with a past medical history as listed below including segmental: Resection due to diverticular disease, CHF (nuc med PET CT cardiac perfusion study 02/21/2023 with LVEF 25%), depression and multiple others, who was referred to me by Donita Brooks, MD for a complaint of alternating bowel habits, gas and burping.    11/08/2021 colonoscopy with 5 to-4 mm polyps in the descending colon, transverse, ascending colon 1 less than 1 mm polyp in the ascending colon, diverticulosis throughout.  Repeat not recommended in 3 years.    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  Patient scheduled for CT then and pelvis.    10/21/2022 CT of the abdomen pelvis with further decrease in size of right retroperitoneal fat attenuation lesion, consistent with residual changes from previously demonstrated retroperitoneal hematoma at that site, colonic diverticulosis and a stable small right inguinal hernia.    11/23/2022 patient seen in clinic by Dr. Marina Goodell.  At that point had been seen in January 2023 with increased intestinal gas and alternating bowel habits.  At that time discussed that a course of Metronidazole and Citrucel did help his issues with gas and loose stool.  At that point recommended continuing a PPI and H2 agonist.  Prescribed an additional course of Metronidazole 250 mg p.o. 3 times daily x 10 days.  Continue on Citrucel 2 tablespoons daily.  Recommended surveillance colonoscopy in March 2026.     Today, patient presents to clinic and describes that he has continued to have some weight loss apparently they have been working up his thyroid at the [place] and they told him it is "messed up", he still is to follow with them about that.  In  regards to GI symptoms tells me that he started to alternate between diarrhea to constipation again and having a lot of gas and burping.  Does admit to drinking some caffeinated beverages.  He also stopped his fiber supplement which she was using daily.  Does tell me he was given a sample of Xifaxan from his PCP and it did help but it was too expensive for him to get from the pharmacy.    Denies fever, chills or blood in his stool.  Past Medical History:  Diagnosis Date   Allergic rhinitis, cause unspecified    Allergy    Anxiety    Arthritis    Cancer (HCC)    a couple places removed from eyelid   Carotid Doppler 04/2020   Carotid US 9/21: No evidence of ICA stenosis bilaterally; right vertebral artery stenosis   Cataract    Chronic combined systolic (congestive) and diastolic (congestive) heart failure (HCC)    Coronary atherosclerosis of unspecified type of vessel, native or graft    a. MI 1985 with unclear details. b. CABG 03/2019 with LIMA -> LAD, SVG -> OM, SVG to dRCA.   Depression    PTSD    Diverticulosis of colon (without mention of hemorrhage)    Esophageal reflux    Esophageal stricture    Essential hypertension    Former tobacco use    Hemorrhoids    Hyperlipidemia    ICD (implantable cardioverter-defibrillator) in place    Irritable bowel syndrome    Ischemic cardiomyopathy    Mild carotid artery disease (HCC)  a. 1-39% bilaterally 03/2019 duplex.   Myocardial infarction (HCC)    Nocturia    Obesity, unspecified    Orthostasis    Other acquired absence of organ    Personal history of colonic polyps    Psychosexual dysfunction with inhibited sexual excitement    Retroperitoneal bleed    Type II or unspecified type diabetes mellitus without mention of complication, not stated as uncontrolled    VT (ventricular tachycardia) (HCC)     Past Surgical History:  Procedure Laterality Date   ADENOIDECTOMY     CARPAL TUNNEL RELEASE     left    CHOLECYSTECTOMY      COLON RESECTION     18 inches   COLON SURGERY  2013   CORONARY ANGIOPLASTY     1985    CORONARY ARTERY BYPASS GRAFT N/A 04/26/2019   Procedure: CORONARY ARTERY BYPASS GRAFTING (CABG) x Three, using left internal mammary artery and right leg greater saphenous vein harvested endoscopically;  Surgeon: Alleen Borne, MD;  Location: [facility] OR;  Service: Open Heart Surgery;  Laterality: N/A;   ELBOW BURSA SURGERY     EYE SURGERY     ICD IMPLANT N/A 06/25/2020   Procedure: ICD IMPLANT;  Surgeon: Lanier Prude, MD;  Location: [facility] INVASIVE CV LAB;  Service: Cardiovascular;  Laterality: N/A;   LEFT HEART CATH AND CORS/GRAFTS ANGIOGRAPHY N/A 06/19/2020   Procedure: LEFT HEART CATH AND CORS/GRAFTS ANGIOGRAPHY;  Surgeon: Lennette Bihari, MD;  Location: [facility] INVASIVE CV LAB;  Service: Cardiovascular;  Laterality: N/A;   LEFT HEART CATH AND CORS/GRAFTS ANGIOGRAPHY N/A 03/08/2023   Procedure: LEFT HEART CATH AND CORS/GRAFTS ANGIOGRAPHY;  Surgeon: Iran Ouch, MD;  Location: [facility] INVASIVE CV LAB;  Service: Cardiovascular;  Laterality: N/A;   POLYPECTOMY     RIGHT/LEFT HEART CATH AND CORONARY ANGIOGRAPHY N/A 04/16/2019   Procedure: RIGHT/LEFT HEART CATH AND CORONARY ANGIOGRAPHY;  Surgeon: Lyn Records, MD;  Location: [facility] INVASIVE CV LAB;  Service: Cardiovascular;  Laterality: N/A;   SHOULDER SURGERY     Rt. Shoulder   TEE WITHOUT CARDIOVERSION N/A 04/26/2019   Procedure: TRANSESOPHAGEAL ECHOCARDIOGRAM (TEE);  Surgeon: Alleen Borne, MD;  Location: [facility] OR;  Service: Open Heart Surgery;  Laterality: N/A;   TONSILLECTOMY     V TACH ABLATION N/A 06/22/2020   Procedure: V TACH ABLATION;  Surgeon: Lanier Prude, MD;  Location: [facility] INVASIVE CV LAB;  Service: Cardiovascular;  Laterality: N/A;    Current Outpatient Medications  Medication Sig Dispense Refill   acetaminophen (TYLENOL) 500 MG tablet Take 1,000 mg by mouth at bedtime as needed for mild pain.     aspirin EC 81 MG tablet Take 1 tablet (81 mg  total) by mouth daily. 90 tablet 3   azelastine (ASTELIN) 0.1 % nasal spray Place 2 sprays into both nostrils 2 (two) times daily. Use in each nostril as directed (Patient taking differently: Place 2 sprays into both nostrils daily as needed for allergies or rhinitis. Use in each nostril as directed) 30 mL 12   carvedilol (COREG) 6.25 MG tablet Take 0.5 tablets (3.125 mg total) by mouth 2 (two) times daily. 90 tablet 2   Cetirizine HCl (ZERVIATE) 0.24 % SOLN Apply 1 drop to eye 2 (two) times daily. 30 each 3   cholestyramine (QUESTRAN) 4 g packet Take 0.5 packets (2 g total) by mouth 2 (two) times daily. 60 each 3   citalopram (CELEXA) 10 MG tablet Take 1 tablet (10 mg total) by  mouth daily. 90 tablet 1   clotrimazole (LOTRIMIN) 1 % cream Apply 1 Application topically in the morning, at noon, and at bedtime. 30 g 1   diphenoxylate-atropine (LOMOTIL) 2.5-0.025 MG tablet Take 2 tablets by mouth 4 (four) times daily as needed for diarrhea or loose stools. 30 tablet 0   empagliflozin (JARDIANCE) 25 MG TABS tablet TAKE 25 MG (1 TABLET) BY MOUTH DAILY BEFORE BREAKFAST. STOP PIOGLITAZONE 90 tablet 0   fexofenadine (ALLEGRA) 180 MG tablet Take 1 tablet (180 mg total) by mouth daily as needed. For allergies (Patient taking differently: Take 180 mg by mouth daily. For allergies) 90 tablet 0   finasteride (PROSCAR) 5 MG tablet TAKE 1 TABLET BY MOUTH EVERY DAY 90 tablet 3   hydrocortisone (ANUSOL-HC) 2.5 % rectal cream Place 1 Application rectally daily as needed for hemorrhoids or anal itching. 30 g 1   montelukast (SINGULAIR) 10 MG tablet TAKE 1 TABLET BY MOUTH EVERYDAY AT BEDTIME 90 tablet 0   neomycin-polymyxin b-dexamethasone (MAXITROL) 3.5-10000-0.1 OINT Place 1 Application into both eyes 3 (three) times daily.     nystatin (MYCOSTATIN) 100000 UNIT/ML suspension Take 5 mLs (500,000 Units total) by mouth 4 (four) times daily. 60 mL 0   olopatadine (PATADAY) 0.1 % ophthalmic solution Place 1 drop into both  eyes daily as needed for allergies.     pantoprazole (PROTONIX) 40 MG tablet Take 1 tablet (40 mg total) by mouth 2 (two) times daily. 60 tablet 11   rosuvastatin (CRESTOR) 40 MG tablet TAKE 1 TABLET BY MOUTH DAILY AT 6 PM. 90 tablet 3   sitaGLIPtin (JANUVIA) 50 MG tablet Take 1 tablet (50 mg total) by mouth daily. 90 tablet 3   spironolactone (ALDACTONE) 25 MG tablet Take 0.5 tablets (12.5 mg total) by mouth daily. 15 tablet 6   vitamin B-12 (CYANOCOBALAMIN) 100 MCG tablet Take 100 mcg by mouth daily.     rifaximin (XIFAXAN) 550 MG TABS tablet Take 1 tablet (550 mg total) by mouth 2 (two) times daily. (Patient not taking: Reported on 08/16/2023) 28 tablet 0   No current facility-administered medications for this visit.    Allergies as of 08/16/2023 - Review Complete 08/16/2023  Allergen Reaction Noted   Doxazosin mesylate Other (See Comments)    Losartan  11/26/2008   Testosterone  10/28/2011   Flomax [tamsulosin hcl] Itching and Other (See Comments) 03/12/2015   Penicillins Rash and Other (See Comments)    Sulfonamide derivatives Rash    Trulicity [dulaglutide] Nausea And Vomiting 06/01/2016    Family History  Problem Relation Age of Onset   Lung cancer Father    Allergic rhinitis Father    Coronary artery disease Mother    Colon cancer Neg Hx    Heart attack Neg Hx    Hypertension Neg Hx    Stroke Neg Hx    Esophageal cancer Neg Hx    Rectal cancer Neg Hx    Stomach cancer Neg Hx    Pancreatic cancer Neg Hx     Social History   Socioeconomic History   Marital status: Married    Spouse name: Not on file   Number of children: 1   Years of education: 12   Highest education level: High school graduate  Occupational History   Occupation: Therapist, sports- retired  Tobacco Use   Smoking status: Former    Current packs/day: 0.00    Types: Cigarettes    Quit date: 08/30/1983    Years since quitting: 39.9   Smokeless  tobacco: Former   Tobacco comments:    Quit 30 years ago.   Vaping Use   Vaping status: Never Used  Substance and Sexual Activity   Alcohol use: Yes    Comment: rare   Drug use: No   Sexual activity: Yes  Other Topics Concern   Not on file  Social History Narrative   Not on file   Social Drivers of Health   Financial Resource Strain: Low Risk  (12/22/2022)   Overall Financial Resource Strain (CARDIA)    Difficulty of Paying Living Expenses: Not hard at all  Food Insecurity: No Food Insecurity (12/22/2022)   Hunger Vital Sign    Worried About Running Out of Food in the Last Year: Never true    Ran Out of Food in the Last Year: Never true  Transportation Needs: No Transportation Needs (12/22/2022)   PRAPARE - Administrator, Civil Service (Medical): No    Lack of Transportation (Non-Medical): No  Physical Activity: Insufficiently Active (12/22/2022)   Exercise Vital Sign    Days of Exercise per Week: 3 days    Minutes of Exercise per Session: 30 min  Stress: No Stress Concern Present (12/22/2022)   [employer] of Occupational Health - Occupational Stress Questionnaire    Feeling of Stress : Not at all  Social Connections: Moderately Isolated (12/22/2022)   Social Connection and Isolation Panel [NHANES]    Frequency of Communication with Friends and Family: More than three times a week    Frequency of Social Gatherings with Friends and Family: More than three times a week    Attends Religious Services: Never    Database administrator or Organizations: No    Attends Banker Meetings: Never    Marital Status: Married  Catering manager Violence: Not At Risk (12/22/2022)   Humiliation, Afraid, Rape, and Kick questionnaire    Fear of Current or Ex-Partner: No    Emotionally Abused: No    Physically Abused: No    Sexually Abused: No    Review of Systems:    Constitutional: No weight loss, fever or chills Skin: No rash  Cardiovascular: No chest pain  Respiratory: No SOB  Gastrointestinal: See HPI and  otherwise negative Genitourinary: No dysuria  Neurological: No headache, dizziness or syncope Musculoskeletal: No new muscle or joint pain Hematologic: No bleeding  Psychiatric: No history of depression or anxiety   Physical Exam:  Vital signs: BP 110/60   Ht 5\' 11"  (1.803 m)   Wt 186 lb (84.4 kg)   SpO2 98%   BMI 25.94 kg/m    Constitutional:   Pleasant Caucasian male appears to be in NAD, Well developed, Well nourished, alert and cooperative Respiratory: Respirations even and unlabored. Lungs clear to auscultation bilaterally.   No wheezes, crackles, or rhonchi.  Cardiovascular: Normal S1, S2. No MRG. Regular rate and rhythm. No peripheral edema, cyanosis or pallor.  Gastrointestinal:  Soft, nondistended, nontender. No rebound or guarding. Normal bowel sounds. No appreciable masses or hepatomegaly. Rectal:  Not performed.  Psychiatric: Oriented to person, place and time. Demonstrates good judgement and reason without abnormal affect or behaviors.  RELEVANT LABS AND IMAGING: CBC    Component Value Date/Time   WBC 6.9 03/01/2023 1412   WBC 7.5 12/05/2022 1543   RBC 5.46 03/01/2023 1412   RBC 5.46 12/05/2022 1543   HGB 15.4 03/01/2023 1412   HCT 47.9 03/01/2023 1412   PLT 124 (L) 03/01/2023 1412   MCV 88 03/01/2023  1412   MCH 28.2 03/01/2023 1412   MCH 27.7 12/05/2022 1543   MCHC 32.2 03/01/2023 1412   MCHC 32.0 12/05/2022 1543   RDW 14.4 03/01/2023 1412   LYMPHSABS 1,673 12/05/2022 1543   MONOABS 0.8 06/19/2020 0833   EOSABS 60 12/05/2022 1543   BASOSABS 53 12/05/2022 1543    CMP     Component Value Date/Time   NA 137 03/01/2023 1412   K 4.3 03/01/2023 1412   CL 99 03/01/2023 1412   CO2 23 03/01/2023 1412   GLUCOSE 123 (H) 03/01/2023 1412   GLUCOSE 135 (H) 12/05/2022 1543   BUN 11 03/01/2023 1412   CREATININE 0.83 03/01/2023 1412   CREATININE 0.80 12/05/2022 1543   CALCIUM 11.3 (H) 07/20/2023 1559   PROT 6.6 12/05/2022 1543   PROT 6.3 05/15/2019 1436    ALBUMIN 4.3 05/15/2019 1436   AST 34 12/05/2022 1543   ALT 32 12/05/2022 1543   ALKPHOS 110 05/15/2019 1436   BILITOT 0.9 12/05/2022 1543   BILITOT 0.7 05/15/2019 1436   GFRNONAA 86 01/15/2021 0847   GFRAA 100 01/15/2021 0847   Assessment: 1.  Bloating/gas: This has been a recurrent problem for the patient, treated in the past with Flagyl which was helpful, apparently tried a sample of Xifaxan from his PCP but tried to buy this medicine was too expensive; likely SIBO +/- diet 2.  Alternating bowel habits: Alternating from diarrhea to constipation, treated in the past with fiber, currently not using this 3.  CHF with decreased EF  Plan: 1.  Prescribed Flagyl 250 mg p.o. 3 times daily x 10 days. 2.  Recommend the patient restart Citrucel 2 tablespoons daily 3.  Continue PPI 4.  Reviewed an anti- gas producing diet, recommend he limit his intake of carbonated beverages 5.  Patient to follow in clinic with [place] as needed.  Hyacinth Meeker, PA-C [facility] 08/16/2023, 1:25 PM  Cc: Donita Brooks, MD

## 2023-08-16 NOTE — Patient Instructions (Signed)
We have sent the following medications to your pharmacy for you to pick up at your convenience: Flagyl 250 mg three times daily for ten days.  Take Citrucel daily.  _______________________________________________________  If your blood pressure at your visit was 140/90 or greater, please contact your primary care physician to follow up on this.  _______________________________________________________  If you are age 76 or older, your body mass index should be between 23-30. Your Body mass index is 25.94 kg/m. If this is out of the aforementioned range listed, please consider follow up with your Primary Care Provider.  If you are age 59 or younger, your body mass index should be between 19-25. Your Body mass index is 25.94 kg/m. If this is out of the aformentioned range listed, please consider follow up with your Primary Care Provider.   ________________________________________________________  The  GI providers would like to encourage you to use [facility] to communicate with providers for non-urgent requests or questions.  Due to long hold times on the telephone, sending your provider a message by [facility] may be a faster and more efficient way to get a response.  Please allow 48 business hours for a response.  Please remember that this is for non-urgent requests.    It was a pleasure to see you today!  Thank you for trusting me with your gastrointestinal care!    Hyacinth Meeker PA-C

## 2023-08-24 NOTE — Progress Notes (Signed)
Noted  

## 2023-08-28 ENCOUNTER — Other Ambulatory Visit: Payer: Medicare HMO

## 2023-08-28 DIAGNOSIS — E1169 Type 2 diabetes mellitus with other specified complication: Secondary | ICD-10-CM | POA: Diagnosis not present

## 2023-08-28 DIAGNOSIS — E782 Mixed hyperlipidemia: Secondary | ICD-10-CM | POA: Diagnosis not present

## 2023-08-28 DIAGNOSIS — E785 Hyperlipidemia, unspecified: Secondary | ICD-10-CM | POA: Diagnosis not present

## 2023-08-29 ENCOUNTER — Other Ambulatory Visit: Payer: Medicare HMO

## 2023-08-29 LAB — PROTEIN / CREATININE RATIO, URINE
Creatinine, Urine: 66 mg/dL (ref 20–320)
Protein/Creat Ratio: 76 mg/g{creat} (ref 25–148)
Protein/Creatinine Ratio: 0.076 mg/mg{creat} (ref 0.025–0.148)
Total Protein, Urine: 5 mg/dL (ref 5–25)

## 2023-08-29 LAB — HEMOGLOBIN A1C
Hgb A1c MFr Bld: 7.5 %{Hb} — ABNORMAL HIGH (ref <5.7)
Mean Plasma Glucose: 169 mg/dL
eAG (mmol/L): 9.3 mmol/L

## 2023-09-13 ENCOUNTER — Other Ambulatory Visit: Payer: Self-pay

## 2023-09-13 DIAGNOSIS — E210 Primary hyperparathyroidism: Secondary | ICD-10-CM

## 2023-09-13 DIAGNOSIS — E119 Type 2 diabetes mellitus without complications: Secondary | ICD-10-CM | POA: Diagnosis not present

## 2023-09-21 ENCOUNTER — Ambulatory Visit: Payer: Medicare HMO

## 2023-09-21 ENCOUNTER — Other Ambulatory Visit: Payer: Medicare HMO

## 2023-09-21 DIAGNOSIS — I472 Ventricular tachycardia, unspecified: Secondary | ICD-10-CM | POA: Diagnosis not present

## 2023-09-21 DIAGNOSIS — E210 Primary hyperparathyroidism: Secondary | ICD-10-CM | POA: Diagnosis not present

## 2023-09-21 DIAGNOSIS — E559 Vitamin D deficiency, unspecified: Secondary | ICD-10-CM | POA: Diagnosis not present

## 2023-09-21 LAB — CUP PACEART REMOTE DEVICE CHECK
Battery Remaining Longevity: 111 mo
Battery Voltage: 3.01 V
Brady Statistic RV Percent Paced: 0.01 %
Date Time Interrogation Session: 20250123002205
HighPow Impedance: 71 Ohm
Implantable Lead Connection Status: 753985
Implantable Lead Implant Date: 20211028
Implantable Lead Location: 753860
Implantable Pulse Generator Implant Date: 20211028
Lead Channel Impedance Value: 399 Ohm
Lead Channel Impedance Value: 437 Ohm
Lead Channel Pacing Threshold Amplitude: 0.625 V
Lead Channel Pacing Threshold Pulse Width: 0.4 ms
Lead Channel Sensing Intrinsic Amplitude: 10.75 mV
Lead Channel Sensing Intrinsic Amplitude: 10.75 mV
Lead Channel Setting Pacing Amplitude: 2 V
Lead Channel Setting Pacing Pulse Width: 0.4 ms
Lead Channel Setting Sensing Sensitivity: 0.3 mV
Zone Setting Status: 755011
Zone Setting Status: 755011

## 2023-09-26 ENCOUNTER — Ambulatory Visit (INDEPENDENT_AMBULATORY_CARE_PROVIDER_SITE_OTHER): Payer: Medicare HMO | Admitting: "Endocrinology

## 2023-09-26 ENCOUNTER — Encounter: Payer: Self-pay | Admitting: "Endocrinology

## 2023-09-26 VITALS — BP 120/80 | HR 71 | Ht 71.0 in | Wt 181.0 lb

## 2023-09-26 DIAGNOSIS — E559 Vitamin D deficiency, unspecified: Secondary | ICD-10-CM

## 2023-09-26 DIAGNOSIS — R634 Abnormal weight loss: Secondary | ICD-10-CM | POA: Diagnosis not present

## 2023-09-26 DIAGNOSIS — E213 Hyperparathyroidism, unspecified: Secondary | ICD-10-CM

## 2023-09-26 LAB — VITAMIN D 1,25 DIHYDROXY
Vitamin D 1, 25 (OH)2 Total: 38 pg/mL (ref 18–72)
Vitamin D2 1, 25 (OH)2: <8 pg/mL
Vitamin D3 1, 25 (OH)2: 38 pg/mL

## 2023-09-26 LAB — VITAMIN D 25 HYDROXY (VIT D DEFICIENCY, FRACTURES): Vit D, 25-Hydroxy: 22 ng/mL — ABNORMAL LOW (ref 30–100)

## 2023-09-26 LAB — RENAL FUNCTION PANEL
Albumin: 4.9 g/dL (ref 3.6–5.1)
BUN: 12 mg/dL (ref 7–25)
CO2: 32 mmol/L (ref 20–32)
Calcium: 11.3 mg/dL — ABNORMAL HIGH (ref 8.6–10.3)
Chloride: 98 mmol/L (ref 98–110)
Creat: 0.93 mg/dL (ref 0.70–1.28)
Glucose, Bld: 113 mg/dL — ABNORMAL HIGH (ref 65–99)
Phosphorus: 3.4 mg/dL (ref 2.1–4.3)
Potassium: 4.1 mmol/L (ref 3.5–5.3)
Sodium: 139 mmol/L (ref 135–146)

## 2023-09-26 LAB — PTH, INTACT AND CALCIUM
Calcium: 11.3 mg/dL — ABNORMAL HIGH (ref 8.6–10.3)
PTH: 114 pg/mL — ABNORMAL HIGH (ref 16–77)

## 2023-09-26 LAB — MAGNESIUM: Magnesium: 1.7 mg/dL (ref 1.5–2.5)

## 2023-09-26 NOTE — Progress Notes (Signed)
Outpatient Endocrinology Note Cameron Bryant, MD    Cameron Daniel 1946-08-11 [id number]  Referring Provider: Donita Brooks, MD Primary Care Provider: Donita Brooks, MD Reason for consultation: Subjective   Assessment & Plan  Cameron Daniel" was seen today for establish care.  Diagnoses and all orders for this visit:  Hyperparathyroidism (HCC) -     PTH, intact and calcium -     VITAMIN D 25 Hydroxy (Vit-D Deficiency, Fractures) -     Renal function panel  Vitamin D deficiency -     PTH, intact and calcium -     VITAMIN D 25 Hydroxy (Vit-D Deficiency, Fractures) -     Renal function panel  Weight loss   Patient referred for hypercalcemia in the setting of vitamin D deficiency and hyperparathyroidism, likely primary in nature, with history of kidney stones 09/21/2023: Vitamin D low at 22, magnesium also found to be low normal at 1.7, corrected calcium at 10.6 Patient is currently not taking any calcium or vitamin D supplementation Recommend starting 2000 to 3000 units of vitamin D every day for the next 3 months as well as magnesium 400 mg daily followed by repeat labs If hyperparathyroidism continued, will complete workup with bone density as well as 24-hour urine collection  Patient reports significant weight loss recently Reports that he had stopped eating [place] fries that he used to eat, no other major change Remote smoker in the past Discussed high-protein diet Patient to continue follow-up with PCP  Return in about 3 months (around 12/25/2023) for visit + labs before next visit.   I have reviewed current medications, nurse's notes, allergies, vital signs, past medical and surgical history, family medical history, and social history for this encounter. Counseled patient on symptoms, examination findings, lab findings, imaging results, treatment decisions and monitoring and prognosis. The patient understood the recommendations and agrees with the  treatment plan. All questions regarding treatment plan were fully answered.  Cameron [place], MD  09/26/23   History of Present Illness HPI  Cameron Daniel is a 78 y.o. male referred by Dr. Tanya Nones for evaluation and management of hypercalcemia.    Patient goes to [place] in [place] for some medial issues C/o weight los and triple bypass Served in [place] and reports exposure to  agent orange and asbestos Had colon surgery   Patient had a history of kidney stones 10-15 years ago, one episode. He  current hematuria No polyuria Yes nocturia Yes thirst Yes renal failure No anorexia Yes abdominal pain No heartburn Yes, on protonix constipation Yes, varies between constipation/diarrhea/normal  nausea or vomiting No history of peptic ulcer disease No depression Yes, diagnosed of PTSD confusion No excessive fatigue Yes fracture No osteoporosis No headaches Yes numbness Yes tingling No  He takes Calcium No He takes Vitamin D supplements No  He a history of taking chronic lithium No He a recent history of thiazide diuretic intake No  He family history of renal stones/hypercalcemia Yes, son had more than one episode of kidney stones, day may have had one episode of kidney stones  a personal history of MEN syndromes/medullary thyroid cancer/ pheochromocytoma No  Physical Exam  BP 120/80 (BP Location: Left Arm, Patient Position: Sitting, Cuff Size: Small)   Pulse 71   Ht 5\' 11"  (1.803 m)   Wt 181 lb (82.1 kg)   SpO2 98%   BMI 25.24 kg/m    Constitutional: well developed, well nourished Head: normocephalic, atraumatic Eyes: sclera anicteric, no redness  Neck: supple Lungs: normal respiratory effort Neurology: alert and oriented Skin: dry, no appreciable rashes Musculoskeletal: no appreciable defects Psychiatric: normal mood and affect   Current Medications Patient's Medications  New Prescriptions   No medications on file  Previous Medications   ACETAMINOPHEN  (TYLENOL) 500 MG TABLET    Take 1,000 mg by mouth at bedtime as needed for mild pain.   ASPIRIN EC 81 MG TABLET    Take 1 tablet (81 mg total) by mouth daily.   AZELASTINE (ASTELIN) 0.1 % NASAL SPRAY    Place 2 sprays into both nostrils 2 (two) times daily. Use in each nostril as directed   CARVEDILOL (COREG) 6.25 MG TABLET    Take 0.5 tablets (3.125 mg total) by mouth 2 (two) times daily.   CETIRIZINE HCL (ZERVIATE) 0.24 % SOLN    Apply 1 drop to eye 2 (two) times daily.   CHOLESTYRAMINE (QUESTRAN) 4 G PACKET    Take 0.5 packets (2 g total) by mouth 2 (two) times daily.   CITALOPRAM (CELEXA) 10 MG TABLET    Take 1 tablet (10 mg total) by mouth daily.   CLOTRIMAZOLE (LOTRIMIN) 1 % CREAM    Apply 1 Application topically in the morning, at noon, and at bedtime.   DIPHENOXYLATE-ATROPINE (LOMOTIL) 2.5-0.025 MG TABLET    Take 2 tablets by mouth 4 (four) times daily as needed for diarrhea or loose stools.   EMPAGLIFLOZIN (JARDIANCE) 25 MG TABS TABLET    TAKE 25 MG (1 TABLET) BY MOUTH DAILY BEFORE BREAKFAST. STOP PIOGLITAZONE   FEXOFENADINE (ALLEGRA) 180 MG TABLET    Take 1 tablet (180 mg total) by mouth daily as needed. For allergies   FINASTERIDE (PROSCAR) 5 MG TABLET    TAKE 1 TABLET BY MOUTH EVERY DAY   HYDROCORTISONE (ANUSOL-HC) 2.5 % RECTAL CREAM    Place 1 Application rectally daily as needed for hemorrhoids or anal itching.   METRONIDAZOLE (FLAGYL) 250 MG TABLET    Take 1 tablet (250 mg total) by mouth 3 (three) times daily.   MONTELUKAST (SINGULAIR) 10 MG TABLET    TAKE 1 TABLET BY MOUTH EVERYDAY AT BEDTIME   NEOMYCIN-POLYMYXIN B-DEXAMETHASONE (MAXITROL) 3.5-10000-0.1 OINT    Place 1 Application into both eyes 3 (three) times daily.   NYSTATIN (MYCOSTATIN) 100000 UNIT/ML SUSPENSION    Take 5 mLs (500,000 Units total) by mouth 4 (four) times daily.   OLOPATADINE (PATADAY) 0.1 % OPHTHALMIC SOLUTION    Place 1 drop into both eyes daily as needed for allergies.   PANTOPRAZOLE (PROTONIX) 40 MG TABLET     Take 1 tablet (40 mg total) by mouth 2 (two) times daily.   RIFAXIMIN (XIFAXAN) 550 MG TABS TABLET    Take 1 tablet (550 mg total) by mouth 2 (two) times daily.   ROSUVASTATIN (CRESTOR) 40 MG TABLET    TAKE 1 TABLET BY MOUTH DAILY AT 6 PM.   SITAGLIPTIN (JANUVIA) 50 MG TABLET    Take 1 tablet (50 mg total) by mouth daily.   SPIRONOLACTONE (ALDACTONE) 25 MG TABLET    Take 0.5 tablets (12.5 mg total) by mouth daily.   VITAMIN B-12 (CYANOCOBALAMIN) 100 MCG TABLET    Take 100 mcg by mouth daily.  Modified Medications   No medications on file  Discontinued Medications   No medications on file    Allergies Allergies  Allergen Reactions   Doxazosin Mesylate Other (See Comments)    Drops in blood pressure    Losartan  Other reaction(s): Muscle pain   Testosterone     Other reaction(s): Erythrocytosis Other reaction(s): Erythrocytosis   Flomax [Tamsulosin Hcl] Itching and Other (See Comments)    Drops in blood pressure     Penicillins Rash and Other (See Comments)    Has patient had a PCN reaction causing immediate rash, facial/tongue/throat swelling, SOB or lightheadedness with hypotension: No Has patient had a PCN reaction causing severe rash involving mucus membranes or skin necrosis: No Has patient had a PCN reaction that required hospitalization No Has patient had a PCN reaction occurring within the last 10 years: No If all of the above answers are "NO", then may proceed with Cephalosporin use.    Sulfonamide Derivatives Rash   Trulicity [Dulaglutide] Nausea And Vomiting    N&V, Dizziness    Past Medical History Past Medical History:  Diagnosis Date   Allergic rhinitis, cause unspecified    Allergy    Anxiety    Arthritis    Cancer (HCC)    a couple places removed from eyelid   Carotid Doppler 04/2020   Carotid US 9/21: No evidence of ICA stenosis bilaterally; right vertebral artery stenosis   Cataract    Chronic combined systolic (congestive) and diastolic  (congestive) heart failure (HCC)    Coronary atherosclerosis of unspecified type of vessel, native or graft    a. MI 1985 with unclear details. b. CABG 03/2019 with LIMA -> LAD, SVG -> OM, SVG to dRCA.   Depression    PTSD    Diverticulosis of colon (without mention of hemorrhage)    Esophageal reflux    Esophageal stricture    Essential hypertension    Former tobacco use    Hemorrhoids    Hyperlipidemia    ICD (implantable cardioverter-defibrillator) in place    Irritable bowel syndrome    Ischemic cardiomyopathy    Mild carotid artery disease (HCC)    a. 1-39% bilaterally 03/2019 duplex.   Myocardial infarction (HCC)    Nocturia    Obesity, unspecified    Orthostasis    Other acquired absence of organ    Personal history of colonic polyps    Psychosexual dysfunction with inhibited sexual excitement    Retroperitoneal bleed    Type II or unspecified type diabetes mellitus without mention of complication, not stated as uncontrolled    VT (ventricular tachycardia) (HCC)     Past Surgical History Past Surgical History:  Procedure Laterality Date   ADENOIDECTOMY     CARPAL TUNNEL RELEASE     left    CHOLECYSTECTOMY     COLON RESECTION     18 inches   COLON SURGERY  2013   CORONARY ANGIOPLASTY     1985    CORONARY ARTERY BYPASS GRAFT N/A 04/26/2019   Procedure: CORONARY ARTERY BYPASS GRAFTING (CABG) x Three, using left internal mammary artery and right leg greater saphenous vein harvested endoscopically;  Surgeon: Alleen Borne, MD;  Location: [facility] OR;  Service: Open Heart Surgery;  Laterality: N/A;   ELBOW BURSA SURGERY     EYE SURGERY     ICD IMPLANT N/A 06/25/2020   Procedure: ICD IMPLANT;  Surgeon: Lanier Prude, MD;  Location: [facility] INVASIVE CV LAB;  Service: Cardiovascular;  Laterality: N/A;   LEFT HEART CATH AND CORS/GRAFTS ANGIOGRAPHY N/A 06/19/2020   Procedure: LEFT HEART CATH AND CORS/GRAFTS ANGIOGRAPHY;  Surgeon: Lennette Bihari, MD;  Location: [facility] INVASIVE CV  LAB;  Service: Cardiovascular;  Laterality: N/A;   LEFT HEART CATH  AND CORS/GRAFTS ANGIOGRAPHY N/A 03/08/2023   Procedure: LEFT HEART CATH AND CORS/GRAFTS ANGIOGRAPHY;  Surgeon: Iran Ouch, MD;  Location: [facility] INVASIVE CV LAB;  Service: Cardiovascular;  Laterality: N/A;   POLYPECTOMY     RIGHT/LEFT HEART CATH AND CORONARY ANGIOGRAPHY N/A 04/16/2019   Procedure: RIGHT/LEFT HEART CATH AND CORONARY ANGIOGRAPHY;  Surgeon: Lyn Records, MD;  Location: [facility] INVASIVE CV LAB;  Service: Cardiovascular;  Laterality: N/A;   SHOULDER SURGERY     Rt. Shoulder   TEE WITHOUT CARDIOVERSION N/A 04/26/2019   Procedure: TRANSESOPHAGEAL ECHOCARDIOGRAM (TEE);  Surgeon: Alleen Borne, MD;  Location: [facility] OR;  Service: Open Heart Surgery;  Laterality: N/A;   TONSILLECTOMY     V TACH ABLATION N/A 06/22/2020   Procedure: V TACH ABLATION;  Surgeon: Lanier Prude, MD;  Location: [facility] INVASIVE CV LAB;  Service: Cardiovascular;  Laterality: N/A;    Family History family history includes Allergic rhinitis in his father; Coronary artery disease in his mother; Lung cancer in his father.  Social History Social History   Socioeconomic History   Marital status: Married    Spouse name: Not on file   Number of children: 1   Years of education: 12   Highest education level: High school graduate  Occupational History   Occupation: Therapist, sports- retired  Tobacco Use   Smoking status: Former    Current packs/day: 0.00    Types: Cigarettes    Quit date: 08/30/1983    Years since quitting: 40.1   Smokeless tobacco: Former   Tobacco comments:    Quit 30 years ago.  Vaping Use   Vaping status: Never Used  Substance and Sexual Activity   Alcohol use: Yes    Comment: rare   Drug use: No   Sexual activity: Yes  Other Topics Concern   Not on file  Social History Narrative   Not on file   Social Drivers of Health   Financial Resource Strain: Low Risk  (12/22/2022)   Overall Financial Resource Strain (CARDIA)     Difficulty of Paying Living Expenses: Not hard at all  Food Insecurity: No Food Insecurity (12/22/2022)   Hunger Vital Sign    Worried About Running Out of Food in the Last Year: Never true    Ran Out of Food in the Last Year: Never true  Transportation Needs: No Transportation Needs (12/22/2022)   PRAPARE - Administrator, Civil Service (Medical): No    Lack of Transportation (Non-Medical): No  Physical Activity: Insufficiently Active (12/22/2022)   Exercise Vital Sign    Days of Exercise per Week: 3 days    Minutes of Exercise per Session: 30 min  Stress: No Stress Concern Present (12/22/2022)   [employer] of Occupational Health - Occupational Stress Questionnaire    Feeling of Stress : Not at all  Social Connections: Moderately Isolated (12/22/2022)   Social Connection and Isolation Panel [NHANES]    Frequency of Communication with Friends and Family: More than three times a week    Frequency of Social Gatherings with Friends and Family: More than three times a week    Attends Religious Services: Never    Database administrator or Organizations: No    Attends Banker Meetings: Never    Marital Status: Married  Catering manager Violence: Not At Risk (12/22/2022)   Humiliation, Afraid, Rape, and Kick questionnaire    Fear of Current or Ex-Partner: No    Emotionally Abused: No  Physically Abused: No    Sexually Abused: No    Lab Results  Component Value Date   CHOL 116 05/10/2022   Lab Results  Component Value Date   HDL 61 05/10/2022   Lab Results  Component Value Date   LDLCALC 26 05/10/2022   Lab Results  Component Value Date   TRIG 233 (H) 05/10/2022   Lab Results  Component Value Date   CHOLHDL 1.9 05/10/2022   Lab Results  Component Value Date   CREATININE 0.93 09/21/2023   No results found for: "GFR"    Component Value Date/Time   NA 139 09/21/2023 0816   NA 137 03/01/2023 1412   K 4.1 09/21/2023 0816   CL 98 09/21/2023  0816   CO2 32 09/21/2023 0816   GLUCOSE 113 (H) 09/21/2023 0816   BUN 12 09/21/2023 0816   BUN 11 03/01/2023 1412   CREATININE 0.93 09/21/2023 0816   CALCIUM 11.3 (H) 09/21/2023 0816   CALCIUM 11.3 (H) 09/21/2023 0816   PROT 6.6 12/05/2022 1543   PROT 6.3 05/15/2019 1436   ALBUMIN 4.3 05/15/2019 1436   AST 34 12/05/2022 1543   ALT 32 12/05/2022 1543   ALKPHOS 110 05/15/2019 1436   BILITOT 0.9 12/05/2022 1543   BILITOT 0.7 05/15/2019 1436   GFRNONAA 86 01/15/2021 0847   GFRAA 100 01/15/2021 0847      Latest Ref Rng & Units 09/21/2023    8:16 AM 07/20/2023    3:59 PM 03/01/2023    2:12 PM  BMP  Glucose 65 - 99 mg/dL 161   096   BUN 7 - 25 mg/dL 12   11   Creatinine 0.45 - 1.28 mg/dL 4.09   8.11   BUN/Creat Ratio 6 - 22 (calc) SEE NOTE:   13   Sodium 135 - 146 mmol/L 139   137   Potassium 3.5 - 5.3 mmol/L 4.1   4.3   Chloride 98 - 110 mmol/L 98   99   CO2 20 - 32 mmol/L 32   23   Calcium 8.6 - 10.3 mg/dL 8.6 - 91.4 mg/dL 78.2    95.6  21.3  08.6        Component Value Date/Time   WBC 6.9 03/01/2023 1412   WBC 7.5 12/05/2022 1543   RBC 5.46 03/01/2023 1412   RBC 5.46 12/05/2022 1543   HGB 15.4 03/01/2023 1412   HCT 47.9 03/01/2023 1412   PLT 124 (L) 03/01/2023 1412   MCV 88 03/01/2023 1412   MCH 28.2 03/01/2023 1412   MCH 27.7 12/05/2022 1543   MCHC 32.2 03/01/2023 1412   MCHC 32.0 12/05/2022 1543   RDW 14.4 03/01/2023 1412   LYMPHSABS 1,673 12/05/2022 1543   MONOABS 0.8 06/19/2020 0833   EOSABS 60 12/05/2022 1543   BASOSABS 53 12/05/2022 1543   Lab Results  Component Value Date   TSH 1.32 12/05/2022   TSH 1.40 05/10/2022   TSH 2.142 04/21/2019   FREET4 1.23 12/14/2007         Parts of this note may have been dictated using voice recognition software. There may be variances in spelling and vocabulary which are unintentional. Not all errors are proofread. Please notify the Thereasa Parkin if any discrepancies are noted or if the meaning of any statement is not clear.

## 2023-10-09 ENCOUNTER — Other Ambulatory Visit: Payer: Self-pay | Admitting: Family Medicine

## 2023-11-02 NOTE — Progress Notes (Signed)
 Remote ICD transmission.

## 2023-11-16 ENCOUNTER — Encounter: Payer: Self-pay | Admitting: Family Medicine

## 2023-11-16 ENCOUNTER — Ambulatory Visit (INDEPENDENT_AMBULATORY_CARE_PROVIDER_SITE_OTHER): Admitting: Family Medicine

## 2023-11-16 VITALS — BP 124/80 | HR 67 | Temp 97.5°F | Ht 71.0 in | Wt 181.0 lb

## 2023-11-16 DIAGNOSIS — J3089 Other allergic rhinitis: Secondary | ICD-10-CM | POA: Diagnosis not present

## 2023-11-16 MED ORDER — PREDNISONE 20 MG PO TABS
ORAL_TABLET | ORAL | 0 refills | Status: DC
Start: 2023-11-16 — End: 2023-12-23

## 2023-11-16 NOTE — Progress Notes (Signed)
 Subjective:    Patient ID: Cameron Daniel, male    DOB: July 20, 1946, 78 y.o.   MRN: [id number]  HPI Patient reports severe allergies.  He reports itchy watery eyes.  He reports rhinorrhea and head congestion.  He reports postnasal drip and sneezing.  He reports cough.  He reports sore throat and drainage.  He denies any fevers or chills or sinus pain.  Symptoms of been going on now for several weeks despite taking Allegra, Flonase, Singulair, and Patanol.   Past Medical History:  Diagnosis Date   Allergic rhinitis, cause unspecified    Allergy    Anxiety    Arthritis    Cancer (HCC)    a couple places removed from eyelid   Carotid Doppler 04/2020   Carotid US 9/21: No evidence of ICA stenosis bilaterally; right vertebral artery stenosis   Cataract    Chronic combined systolic (congestive) and diastolic (congestive) heart failure (HCC)    Coronary atherosclerosis of unspecified type of vessel, native or graft    a. MI 1985 with unclear details. b. CABG 03/2019 with LIMA -> LAD, SVG -> OM, SVG to dRCA.   Depression    PTSD    Diverticulosis of colon (without mention of hemorrhage)    Esophageal reflux    Esophageal stricture    Essential hypertension    Former tobacco use    Hemorrhoids    Hyperlipidemia    ICD (implantable cardioverter-defibrillator) in place    Irritable bowel syndrome    Ischemic cardiomyopathy    Mild carotid artery disease (HCC)    a. 1-39% bilaterally 03/2019 duplex.   Myocardial infarction (HCC)    Nocturia    Obesity, unspecified    Orthostasis    Other acquired absence of organ    Personal history of colonic polyps    Psychosexual dysfunction with inhibited sexual excitement    Retroperitoneal bleed    Type II or unspecified type diabetes mellitus without mention of complication, not stated as uncontrolled    VT (ventricular tachycardia) (HCC)    Past Surgical History:  Procedure Laterality Date   ADENOIDECTOMY     CARPAL TUNNEL RELEASE      left    CHOLECYSTECTOMY     COLON RESECTION     18 inches   COLON SURGERY  2013   CORONARY ANGIOPLASTY     1985    CORONARY ARTERY BYPASS GRAFT N/A 04/26/2019   Procedure: CORONARY ARTERY BYPASS GRAFTING (CABG) x Three, using left internal mammary artery and right leg greater saphenous vein harvested endoscopically;  Surgeon: Alleen Borne, MD;  Location: [facility] OR;  Service: Open Heart Surgery;  Laterality: N/A;   ELBOW BURSA SURGERY     EYE SURGERY     ICD IMPLANT N/A 06/25/2020   Procedure: ICD IMPLANT;  Surgeon: Lanier Prude, MD;  Location: [facility] INVASIVE CV LAB;  Service: Cardiovascular;  Laterality: N/A;   LEFT HEART CATH AND CORS/GRAFTS ANGIOGRAPHY N/A 06/19/2020   Procedure: LEFT HEART CATH AND CORS/GRAFTS ANGIOGRAPHY;  Surgeon: Lennette Bihari, MD;  Location: [facility] INVASIVE CV LAB;  Service: Cardiovascular;  Laterality: N/A;   LEFT HEART CATH AND CORS/GRAFTS ANGIOGRAPHY N/A 03/08/2023   Procedure: LEFT HEART CATH AND CORS/GRAFTS ANGIOGRAPHY;  Surgeon: Iran Ouch, MD;  Location: [facility] INVASIVE CV LAB;  Service: Cardiovascular;  Laterality: N/A;   POLYPECTOMY     RIGHT/LEFT HEART CATH AND CORONARY ANGIOGRAPHY N/A 04/16/2019   Procedure: RIGHT/LEFT HEART CATH AND CORONARY ANGIOGRAPHY;  Surgeon:  Lyn Records, MD;  Location: [facility] INVASIVE CV LAB;  Service: Cardiovascular;  Laterality: N/A;   SHOULDER SURGERY     Rt. Shoulder   TEE WITHOUT CARDIOVERSION N/A 04/26/2019   Procedure: TRANSESOPHAGEAL ECHOCARDIOGRAM (TEE);  Surgeon: Alleen Borne, MD;  Location: [facility] OR;  Service: Open Heart Surgery;  Laterality: N/A;   TONSILLECTOMY     V TACH ABLATION N/A 06/22/2020   Procedure: V TACH ABLATION;  Surgeon: Lanier Prude, MD;  Location: [facility] INVASIVE CV LAB;  Service: Cardiovascular;  Laterality: N/A;   Current Outpatient Medications on File Prior to Visit  Medication Sig Dispense Refill   acetaminophen (TYLENOL) 500 MG tablet Take 1,000 mg by mouth at bedtime as needed for mild pain.      aspirin EC 81 MG tablet Take 1 tablet (81 mg total) by mouth daily. 90 tablet 3   azelastine (ASTELIN) 0.1 % nasal spray Place 2 sprays into both nostrils 2 (two) times daily. Use in each nostril as directed (Patient taking differently: Place 2 sprays into both nostrils daily as needed for allergies or rhinitis. Use in each nostril as directed) 30 mL 12   carvedilol (COREG) 6.25 MG tablet Take 0.5 tablets (3.125 mg total) by mouth 2 (two) times daily. 90 tablet 2   Cetirizine HCl (ZERVIATE) 0.24 % SOLN Apply 1 drop to eye 2 (two) times daily. 30 each 3   cholestyramine (QUESTRAN) 4 g packet Take 0.5 packets (2 g total) by mouth 2 (two) times daily. 60 each 3   citalopram (CELEXA) 10 MG tablet Take 1 tablet (10 mg total) by mouth daily. 90 tablet 1   clotrimazole (LOTRIMIN) 1 % cream Apply 1 Application topically in the morning, at noon, and at bedtime. 30 g 1   diphenoxylate-atropine (LOMOTIL) 2.5-0.025 MG tablet Take 2 tablets by mouth 4 (four) times daily as needed for diarrhea or loose stools. 30 tablet 0   empagliflozin (JARDIANCE) 25 MG TABS tablet TAKE 25 MG (1 TABLET) BY MOUTH DAILY BEFORE BREAKFAST. STOP PIOGLITAZONE 90 tablet 0   fexofenadine (ALLEGRA) 180 MG tablet Take 1 tablet (180 mg total) by mouth daily as needed. For allergies (Patient taking differently: Take 180 mg by mouth daily. For allergies) 90 tablet 0   finasteride (PROSCAR) 5 MG tablet TAKE 1 TABLET BY MOUTH EVERY DAY 90 tablet 3   hydrocortisone (ANUSOL-HC) 2.5 % rectal cream Place 1 Application rectally daily as needed for hemorrhoids or anal itching. 30 g 1   metroNIDAZOLE (FLAGYL) 250 MG tablet Take 1 tablet (250 mg total) by mouth 3 (three) times daily. 30 tablet 0   montelukast (SINGULAIR) 10 MG tablet TAKE 1 TABLET BY MOUTH EVERYDAY AT BEDTIME 90 tablet 0   neomycin-polymyxin b-dexamethasone (MAXITROL) 3.5-10000-0.1 OINT Place 1 Application into both eyes 3 (three) times daily. (Patient not taking: Reported on  09/26/2023)     nystatin (MYCOSTATIN) 100000 UNIT/ML suspension Take 5 mLs (500,000 Units total) by mouth 4 (four) times daily. (Patient not taking: Reported on 09/26/2023) 60 mL 0   olopatadine (PATADAY) 0.1 % ophthalmic solution Place 1 drop into both eyes daily as needed for allergies.     pantoprazole (PROTONIX) 40 MG tablet TAKE 1 TABLET BY MOUTH TWICE A DAY 180 tablet 3   rifaximin (XIFAXAN) 550 MG TABS tablet Take 1 tablet (550 mg total) by mouth 2 (two) times daily. (Patient not taking: Reported on 09/26/2023) 28 tablet 0   rosuvastatin (CRESTOR) 40 MG tablet TAKE 1 TABLET BY MOUTH  DAILY AT 6 PM. 90 tablet 3   sitaGLIPtin (JANUVIA) 50 MG tablet Take 1 tablet (50 mg total) by mouth daily. 90 tablet 3   spironolactone (ALDACTONE) 25 MG tablet Take 0.5 tablets (12.5 mg total) by mouth daily. 15 tablet 6   vitamin B-12 (CYANOCOBALAMIN) 100 MCG tablet Take 100 mcg by mouth daily. (Patient not taking: Reported on 09/26/2023)     No current facility-administered medications on file prior to visit.   Allergies  Allergen Reactions   Doxazosin Mesylate Other (See Comments)    Drops in blood pressure    Losartan     Other reaction(s): Muscle pain   Testosterone     Other reaction(s): Erythrocytosis Other reaction(s): Erythrocytosis   Flomax [Tamsulosin Hcl] Itching and Other (See Comments)    Drops in blood pressure     Penicillins Rash and Other (See Comments)    Has patient had a PCN reaction causing immediate rash, facial/tongue/throat swelling, SOB or lightheadedness with hypotension: No Has patient had a PCN reaction causing severe rash involving mucus membranes or skin necrosis: No Has patient had a PCN reaction that required hospitalization No Has patient had a PCN reaction occurring within the last 10 years: No If all of the above answers are "NO", then may proceed with Cephalosporin use.    Sulfonamide Derivatives Rash   Trulicity [Dulaglutide] Nausea And Vomiting    N&V, Dizziness    Social History   Socioeconomic History   Marital status: Married    Spouse name: Not on file   Number of children: 1   Years of education: 12   Highest education level: High school graduate  Occupational History   Occupation: Therapist, sports- retired  Tobacco Use   Smoking status: Former    Current packs/day: 0.00    Types: Cigarettes    Quit date: 08/30/1983    Years since quitting: 40.2   Smokeless tobacco: Former   Tobacco comments:    Quit 30 years ago.  Vaping Use   Vaping status: Never Used  Substance and Sexual Activity   Alcohol use: Yes    Comment: rare   Drug use: No   Sexual activity: Yes  Other Topics Concern   Not on file  Social History Narrative   Not on file   Social Drivers of Health   Financial Resource Strain: Low Risk  (12/22/2022)   Overall Financial Resource Strain (CARDIA)    Difficulty of Paying Living Expenses: Not hard at all  Food Insecurity: No Food Insecurity (12/22/2022)   Hunger Vital Sign    Worried About Running Out of Food in the Last Year: Never true    Ran Out of Food in the Last Year: Never true  Transportation Needs: No Transportation Needs (12/22/2022)   PRAPARE - Administrator, Civil Service (Medical): No    Lack of Transportation (Non-Medical): No  Physical Activity: Insufficiently Active (12/22/2022)   Exercise Vital Sign    Days of Exercise per Week: 3 days    Minutes of Exercise per Session: 30 min  Stress: No Stress Concern Present (12/22/2022)   [employer] of Occupational Health - Occupational Stress Questionnaire    Feeling of Stress : Not at all  Social Connections: Moderately Isolated (12/22/2022)   Social Connection and Isolation Panel [NHANES]    Frequency of Communication with Friends and Family: More than three times a week    Frequency of Social Gatherings with Friends and Family: More than three times a week  Attends Religious Services: Never    Active Member of Clubs or Organizations: No    Attends  Banker Meetings: Never    Marital Status: Married  Catering manager Violence: Not At Risk (12/22/2022)   Humiliation, Afraid, Rape, and Kick questionnaire    Fear of Current or Ex-Partner: No    Emotionally Abused: No    Physically Abused: No    Sexually Abused: No     Review of Systems     Objective:   Physical Exam Vitals reviewed.  Constitutional:      Appearance: Normal appearance.  Eyes:     General: Lids are normal.        Left eye: No discharge.     Extraocular Movements:     Right eye: Normal extraocular motion.     Left eye: Normal extraocular motion.     Conjunctiva/sclera:     Right eye: Right conjunctiva is injected.     Left eye: Left conjunctiva is injected.  Cardiovascular:     Rate and Rhythm: Normal rate and regular rhythm.     Pulses: Normal pulses.     Heart sounds: Murmur heard.  Pulmonary:     Effort: Pulmonary effort is normal. No respiratory distress.     Breath sounds: Normal breath sounds. No wheezing, rhonchi or rales.  Musculoskeletal:     Right lower leg: No edema.     Left lower leg: No edema.  Neurological:     Mental Status: He is alert.           Assessment & Plan:   Allergic rhinitis We discussed the risk and benefits of glucocorticoids.  Specifically I discussed with the patient the risk of avascular necrosis and osteoporosis.  Patient states that he is unable to put up with food allergies.  He states that he feels terrible.  Therefore he is willing to accept the risk of the prednisone taper pack.

## 2023-11-28 NOTE — Progress Notes (Unsigned)
  Electrophysiology Office Follow up Visit Note:    Date:  11/29/2023   ID:  Cameron Daniel, Cameron Daniel 02-02-1946, MRN [id number]  PCP:  Donita Brooks, MD  [facility] Cardiologist:  Meriam Sprague, MD (Inactive)  [facility] Electrophysiologist:  Lanier Prude, MD    Interval History:     Cameron Daniel is a 78 y.o. male who presents for a follow up visit.   I last saw the patient in September 28, 2020.  He has a history of ventricular tachycardia with an ICD in situ.  His ICD was implanted June 25, 2020.  He also has a history of coronary artery disease with a prior bypass surgery.  No complaints today other than allergies. No dyspnea, presyncope, syncope. No problems that he is aware of w his ICD.       Past medical, surgical, social and family history were reviewed.  ROS:   Please see the history of present illness.    All other systems reviewed and are negative.  EKGs/Labs/Other Studies Reviewed:    The following studies were reviewed today:  June 19, 2023 echo EF 35-40 RV mildly reduced Dilated left and right atrium Trivial MR Moderate low-flow/low gradient aortic valve stenosis  November 29, 2023 in clinic device interrogation personally reviewed Battery and lead parameters HVR episodes c/w slow VT all lasting < 30 seconds Shock vectors changed based on industry recommendations         Physical Exam:    VS:  BP 104/60   Pulse 69   Ht 5' 10.5" (1.791 m)   Wt 184 lb 9.6 oz (83.7 kg)   SpO2 98%   BMI 26.11 kg/m     Wt Readings from Last 3 Encounters:  11/29/23 184 lb 9.6 oz (83.7 kg)  11/16/23 181 lb (82.1 kg)  09/26/23 181 lb (82.1 kg)     GEN: no distress CARD: RRR, No MRG.  CIED pocket well-healed RESP: No IWOB. CTAB.      ASSESSMENT:    1. VT (ventricular tachycardia) (HCC)   2. Chronic combined systolic and diastolic CHF (congestive heart failure) (HCC)   3. ICD (implantable cardioverter-defibrillator) in place    4. CAD in native artery    PLAN:    In order of problems listed above:  #Ventricular tachycardia #ICD in situ No recent high-voltage therapies Slow VT, all nonsustained Reprogrammed ICD shock factors based on industry recommendations today  #Chronic systolic heart failure NYHA class II.  Warm and dry on exam. Continue spironolactone, Jardiance, Coreg  #Coronary artery disease No ischemic symptoms today Continue aspirin and beta-blocker  Follow-up 1 year with EP APP  Signed, Steffanie Dunn, MD, [facility], [facility] 11/29/2023 2:07 PM    Electrophysiology [facility]

## 2023-11-28 NOTE — H&P (View-Only) (Signed)
  Electrophysiology Office Follow up Visit Note:    Date:  11/29/2023   ID:  Cameron Daniel, Cameron Daniel 02-02-1946, MRN [id number]  PCP:  Donita Brooks, MD  [facility] Cardiologist:  Meriam Sprague, MD (Inactive)  [facility] Electrophysiologist:  Lanier Prude, MD    Interval History:     Cameron Daniel is a 78 y.o. male who presents for a follow up visit.   I last saw the patient in September 28, 2020.  He has a history of ventricular tachycardia with an ICD in situ.  His ICD was implanted June 25, 2020.  He also has a history of coronary artery disease with a prior bypass surgery.  No complaints today other than allergies. No dyspnea, presyncope, syncope. No problems that he is aware of w his ICD.       Past medical, surgical, social and family history were reviewed.  ROS:   Please see the history of present illness.    All other systems reviewed and are negative.  EKGs/Labs/Other Studies Reviewed:    The following studies were reviewed today:  June 19, 2023 echo EF 35-40 RV mildly reduced Dilated left and right atrium Trivial MR Moderate low-flow/low gradient aortic valve stenosis  November 29, 2023 in clinic device interrogation personally reviewed Battery and lead parameters HVR episodes c/w slow VT all lasting < 30 seconds Shock vectors changed based on industry recommendations         Physical Exam:    VS:  BP 104/60   Pulse 69   Ht 5' 10.5" (1.791 m)   Wt 184 lb 9.6 oz (83.7 kg)   SpO2 98%   BMI 26.11 kg/m     Wt Readings from Last 3 Encounters:  11/29/23 184 lb 9.6 oz (83.7 kg)  11/16/23 181 lb (82.1 kg)  09/26/23 181 lb (82.1 kg)     GEN: no distress CARD: RRR, No MRG.  CIED pocket well-healed RESP: No IWOB. CTAB.      ASSESSMENT:    1. VT (ventricular tachycardia) (HCC)   2. Chronic combined systolic and diastolic CHF (congestive heart failure) (HCC)   3. ICD (implantable cardioverter-defibrillator) in place    4. CAD in native artery    PLAN:    In order of problems listed above:  #Ventricular tachycardia #ICD in situ No recent high-voltage therapies Slow VT, all nonsustained Reprogrammed ICD shock factors based on industry recommendations today  #Chronic systolic heart failure NYHA class II.  Warm and dry on exam. Continue spironolactone, Jardiance, Coreg  #Coronary artery disease No ischemic symptoms today Continue aspirin and beta-blocker  Follow-up 1 year with EP APP  Signed, Steffanie Dunn, MD, [facility], [facility] 11/29/2023 2:07 PM    Electrophysiology [facility]

## 2023-11-29 ENCOUNTER — Ambulatory Visit: Payer: Medicare HMO | Attending: Cardiology | Admitting: Cardiology

## 2023-11-29 ENCOUNTER — Encounter: Payer: Self-pay | Admitting: Cardiology

## 2023-11-29 VITALS — BP 104/60 | HR 69 | Ht 70.5 in | Wt 184.6 lb

## 2023-11-29 DIAGNOSIS — I472 Ventricular tachycardia, unspecified: Secondary | ICD-10-CM

## 2023-11-29 DIAGNOSIS — I2510 Atherosclerotic heart disease of native coronary artery without angina pectoris: Secondary | ICD-10-CM | POA: Diagnosis not present

## 2023-11-29 DIAGNOSIS — I5042 Chronic combined systolic (congestive) and diastolic (congestive) heart failure: Secondary | ICD-10-CM | POA: Diagnosis not present

## 2023-11-29 DIAGNOSIS — Z95810 Presence of automatic (implantable) cardiac defibrillator: Secondary | ICD-10-CM

## 2023-11-29 LAB — CUP PACEART INCLINIC DEVICE CHECK
Date Time Interrogation Session: 20250402162915
Implantable Lead Connection Status: 753985
Implantable Lead Implant Date: 20211028
Implantable Lead Location: 753860
Implantable Pulse Generator Implant Date: 20211028

## 2023-11-29 NOTE — Patient Instructions (Signed)
 Medication Instructions:  Your physician recommends that you continue on your current medications as directed. Please refer to the Current Medication list given to you today.  *If you need a refill on your cardiac medications before your next appointment, please call your pharmacy*  Follow-Up: At [facility], you and your health needs are our priority.  As part of our continuing mission to provide you with exceptional heart care, our providers are all part of one team.  This team includes your primary Cardiologist (physician) and Advanced Practice Providers or APPs (Physician Assistants and Nurse Practitioners) who all work together to provide you with the care you need, when you need it.  Your next appointment:   1 year  Provider:   You may see Lanier Prude, MD or one of the following Advanced Practice Providers on your designated Care Team:   Francis Dowse, [place] Casimiro Needle "Mardelle Matte" Courtland, PA-C Sherie Don, NP Canary Brim, NP       1st Floor: - Lobby - Registration  - Pharmacy  - Lab - Cafe  2nd Floor: - PV Lab - Diagnostic Testing (echo, CT, nuclear med)  3rd Floor: - Vacant  4th Floor: - TCTS (cardiothoracic surgery) - AFib Clinic - Structural Heart Clinic - Vascular Surgery  - Vascular Ultrasound  5th Floor: - HeartCare Cardiology (general and EP) - Clinical Pharmacy for coumadin, hypertension, lipid, weight-loss medications, and med management appointments    Valet parking services will be available as well.

## 2023-12-04 DIAGNOSIS — E119 Type 2 diabetes mellitus without complications: Secondary | ICD-10-CM | POA: Diagnosis not present

## 2023-12-14 NOTE — Progress Notes (Signed)
 Cardiology Office Note:  .   Date:  12/15/2023  ID:  Cameron Daniel, DOB 12/03/1945, MRN [id number] PCP: Duanne Butler DASEN, MD  [facility] Providers Cardiologist:  Powell FORBES Sorrow, MD (Inactive) Electrophysiologist:  OLE Daniel HOLTS, MD  Advanced Heart Failure:  Toribio Fuel, MD {  History of Present Illness: .    Chief Complaint  Patient presents with   Follow-up    Cameron Daniel is a 78 y.o. male with history of CHF, VT s/p ICD, CAD s/p CABG, DM, HLD who presents for follow-up.    History of Present Illness   Cameron Daniel is a 78 year old male with systolic heart failure, status post CABG, diabetes, and ventricular tachycardia status post ICD implantation who presents for follow-up.  He has been experiencing low blood pressure with readings such as 107/67 mmHg this morning and 98/60 mmHg at the visit. He experiences occasional dizziness and lightheadedness, particularly when standing up quickly, as he described an incident about a week and a half ago when he felt dizzy after standing up quickly. His heart rate has been noted to drop to 49-54 bpm but returns to normal around 70 bpm later.  He has a history of systolic heart failure with a recent ejection fraction of 40-45%. He is on carvedilol  3.125 mg twice daily and spironolactone  12.5 mg daily. He also takes aspirin  and Crestor  40 mg daily for coronary artery disease status post CABG. No chest pain or dyspnea, except when his allergies act up.  For diabetes management, he is on Jardiance  25 mg daily and a generic form of Januvia  100 mg. His A1c is 7.1%. He mentions that the [place] provided the generic medication due to the cost of Januvia .  He experiences poor sleep quality, feeling as tired upon waking as when he went to bed, and reports frequent dreaming. He attributes some anxiety to his time in [place] and takes citalopram  for this.  He engages in physical activity using a balance device and  light dumbbells, but notes that his lower back cramps up when walking. He maintains a weight of 179-183 lbs. He feels limited in his physical capabilities compared to the past, stating 'I want to do and the body can't keep up.'  No chest pain, trouble breathing, or leg swelling, except for one leg that is larger due to a past injury. No abnormal sounds in his neck arteries and no recent chest x-ray.          Problem List CAD s/p CABG -LIMA-LAD, SVG-OM, SVG-dRCA (04/26/2019) -patent grafts 03/08/2023 Systolic HF -EF 40-45% 08/10/2022 -EF 35-40% 06/19/2023 -BP precludes GDMT VT s/p ablation  HTN HLD -T chol 116, HDL 61, LDL 26, TG 223 6. DM -A1c 7.1    ROS: All other ROS reviewed and negative. Pertinent positives noted in the HPI.     Studies Reviewed: SABRA   EKG Interpretation Date/Time:  Friday December 15 2023 12:53:45 EDT Ventricular Rate:  62 PR Interval:  266 QRS Duration:  144 QT Interval:  468 QTC Calculation: 475 R Axis:   -68  Text Interpretation: Sinus rhythm with 1st degree A-V block Left axis deviation Right bundle branch block Left anterior fasicular block Inferior infarct (cited on or before 23-Jun-2020) Confirmed by Cameron Daniel [phone]) on 12/15/2023 12:55:57 PM   LHC 03/08/2023 1.  Severe underlying three-vessel coronary artery disease with patent grafts including LIMA to LAD, SVG to OM 3 and SVG to right PDA. 2.  Moderately  to severely reduced LV systolic function with an EF of 30% and mildly elevated left ventricular end-diastolic pressure at 20 mmHg. Physical Exam:   VS:  BP 98/60   Pulse 62   Ht 5' 10.5 (1.791 m)   Wt 190 lb (86.2 kg)   SpO2 98%   BMI 26.88 kg/m    Wt Readings from Last 3 Encounters:  12/15/23 190 lb (86.2 kg)  11/29/23 184 lb 9.6 oz (83.7 kg)  11/16/23 181 lb (82.1 kg)    GEN: Well nourished, well developed in no acute distress NECK: No JVD; No carotid bruits CARDIAC: RRR, no murmurs, rubs, gallops RESPIRATORY:  Clear to  auscultation without rales, wheezing or rhonchi  ABDOMEN: Soft, non-tender, non-distended EXTREMITIES:  No edema; No deformity  ASSESSMENT AND PLAN: .   Assessment and Plan    Systolic Heart Failure, EF 40% Chronic systolic heart failure with EF 40-45%. Blood pressure marginal, limiting aggressive therapy. Occasional dizziness likely due to hypotension. Heart rate stable. No volume overload or chest pain. - Continue carvedilol  3.125 mg twice daily. - Continue spironolactone  12.5 mg daily. - Monitor blood pressure and heart rate. - Educated on standing up slowly to prevent dizziness. - Schedule follow-up in 6 months with PA and in 1 year with cardiologist.  Hypotension Intermittent hypotension likely secondary to heart failure and medication regimen. Occasional dizziness when standing quickly.  Coronary Artery Disease (CAD) status post Coronary Artery Bypass Grafting (CABG) CAD with previous CABG. On aspirin  and Crestor  for secondary prevention. No recent chest pain or dyspnea. - Check lipid panel today. - Continue aspirin . - Continue Crestor  40 mg daily.  Ventricular Tachycardia status post Implantable Cardioverter Defibrillator (ICD) implantation Ventricular tachycardia with ICD implantation. No recurrence of arrhythmias. Follows with electrophysiology specialist.  Hyperlipidemia Managed with Crestor . Lipid levels need re-evaluation to ensure target range.              Follow-up: Return in about 1 year (around 12/14/2024).   Signed, Cameron Daniel. Barbaraann, MD, [facility]  [facility]  [address], [address] [place], [place] [address] [phone]  2:22 PM

## 2023-12-15 ENCOUNTER — Ambulatory Visit: Payer: Medicare HMO | Attending: Cardiovascular Disease | Admitting: Cardiovascular Disease

## 2023-12-15 ENCOUNTER — Encounter: Payer: Self-pay | Admitting: Cardiovascular Disease

## 2023-12-15 VITALS — BP 98/60 | HR 62 | Ht 70.5 in | Wt 190.0 lb

## 2023-12-15 DIAGNOSIS — E782 Mixed hyperlipidemia: Secondary | ICD-10-CM | POA: Diagnosis not present

## 2023-12-15 DIAGNOSIS — I472 Ventricular tachycardia, unspecified: Secondary | ICD-10-CM

## 2023-12-15 DIAGNOSIS — I25810 Atherosclerosis of coronary artery bypass graft(s) without angina pectoris: Secondary | ICD-10-CM | POA: Diagnosis not present

## 2023-12-15 DIAGNOSIS — I5022 Chronic systolic (congestive) heart failure: Secondary | ICD-10-CM | POA: Diagnosis not present

## 2023-12-15 NOTE — Patient Instructions (Signed)
 Medication Instructions:  Your physician recommends that you continue on your current medications as directed. Please refer to the Current Medication list given to you today.    *If you need a refill on your cardiac medications before your next appointment, please call your pharmacy*   Lab Work: LIPID PANEL  LIVER FUNCTION TEST   If you have labs (blood work) drawn today and your tests are completely normal, you will receive your results only by: MyChart Message (if you have MyChart) OR A paper copy in the mail If you have any lab test that is abnormal or we need to change your treatment, we will call you to review the results.   Testing/Procedures: NONE    Follow-Up: At [facility], you and your health needs are our priority.  As part of our continuing mission to provide you with exceptional heart care, we have created designated Provider Care Teams.  These Care Teams include your primary Cardiologist (physician) and Advanced Practice Providers (APPs -  Physician Assistants and Nurse Practitioners) who all work together to provide you with the care you need, when you need it.  We recommend signing up for the patient portal called "MyChart".  Sign up information is provided on this After Visit Summary.  MyChart is used to connect with patients for Virtual Visits (Telemedicine).  Patients are able to view lab/test results, encounter notes, upcoming appointments, etc.  Non-urgent messages can be sent to your provider as well.   To learn more about what you can do with MyChart, go to [email or web address].    Your next appointment:   6 month(s)  The format for your next appointment:   In Person  Provider:   Lawana Pray, FNP, Marcie Sever, PA-C, Callie Goodrich, PA-C, Kathleen Johnson, PA-C, Hao Meng, PA-C, Marlana Silvan, NP, or Katlyn West, NP    Then, Vonna Guardian will plan to see you again in 1 year(s).   Other Instructions

## 2023-12-16 LAB — LIPID PANEL
Chol/HDL Ratio: 1.9 ratio (ref 0.0–5.0)
Cholesterol, Total: 120 mg/dL (ref 100–199)
HDL: 63 mg/dL (ref >39)
LDL Chol Calc (NIH): 35 mg/dL (ref 0–99)
Triglycerides: 124 mg/dL (ref 0–149)
VLDL Cholesterol Cal: 22 mg/dL (ref 5–40)

## 2023-12-16 LAB — HEPATIC FUNCTION PANEL
ALT: 18 IU/L (ref 0–44)
AST: 26 IU/L (ref 0–40)
Albumin: 4.6 g/dL (ref 3.8–4.8)
Alkaline Phosphatase: 103 IU/L (ref 44–121)
Bilirubin Total: 0.5 mg/dL (ref 0.0–1.2)
Bilirubin, Direct: 0.24 mg/dL (ref 0.00–0.40)
Total Protein: 6.1 g/dL (ref 6.0–8.5)

## 2023-12-18 ENCOUNTER — Encounter: Payer: Self-pay | Admitting: Family Medicine

## 2023-12-18 ENCOUNTER — Telehealth: Payer: Self-pay | Admitting: Cardiovascular Disease

## 2023-12-18 ENCOUNTER — Ambulatory Visit: Admitting: Family Medicine

## 2023-12-18 VITALS — BP 102/62 | HR 43 | Temp 98.8°F | Ht 70.5 in | Wt 189.2 lb

## 2023-12-18 DIAGNOSIS — G4700 Insomnia, unspecified: Secondary | ICD-10-CM | POA: Insufficient documentation

## 2023-12-18 DIAGNOSIS — R197 Diarrhea, unspecified: Secondary | ICD-10-CM

## 2023-12-18 DIAGNOSIS — G4709 Other insomnia: Secondary | ICD-10-CM

## 2023-12-18 DIAGNOSIS — J3089 Other allergic rhinitis: Secondary | ICD-10-CM

## 2023-12-18 DIAGNOSIS — R001 Bradycardia, unspecified: Secondary | ICD-10-CM | POA: Insufficient documentation

## 2023-12-18 DIAGNOSIS — Z135 Encounter for screening for eye and ear disorders: Secondary | ICD-10-CM | POA: Insufficient documentation

## 2023-12-18 DIAGNOSIS — B079 Viral wart, unspecified: Secondary | ICD-10-CM | POA: Insufficient documentation

## 2023-12-18 DIAGNOSIS — K219 Gastro-esophageal reflux disease without esophagitis: Secondary | ICD-10-CM

## 2023-12-18 DIAGNOSIS — M76821 Posterior tibial tendinitis, right leg: Secondary | ICD-10-CM | POA: Insufficient documentation

## 2023-12-18 DIAGNOSIS — J45909 Unspecified asthma, uncomplicated: Secondary | ICD-10-CM | POA: Insufficient documentation

## 2023-12-18 MED ORDER — MIRTAZAPINE 7.5 MG PO TABS: 7.5000 mg | ORAL_TABLET | Freq: Every day | ORAL | 0 refills | Status: DC

## 2023-12-18 MED ORDER — PREDNISONE 10 MG (21) PO TBPK: ORAL_TABLET | ORAL | 0 refills | Status: DC

## 2023-12-18 MED ORDER — AZELASTINE HCL 0.1 % NA SOLN: 1.0000 | Freq: Two times a day (BID) | NASAL | 5 refills | Status: DC

## 2023-12-18 MED ORDER — ONDANSETRON HCL 4 MG PO TABS: 4.0000 mg | ORAL_TABLET | Freq: Three times a day (TID) | ORAL | 0 refills | Status: DC | PRN

## 2023-12-18 NOTE — Telephone Encounter (Signed)
 Patient c/o Palpitations:  STAT if patient reporting lightheadedness, shortness of breath, or chest pain  How long have you had palpitations/irregular HR/ Afib? Are you having the symptoms now? HR  Are you currently experiencing lightheadedness, SOB or CP? Only occasional lightheadedness if he gets up too fast  Do you have a history of afib (atrial fibrillation) or irregular heart rhythm? Has a defib  Have you checked your BP or HR? (document readings if available): today HR 43 ,102/62 staying in the 40's over the weekend. Was running 60's and dropping to 40's then back up. PCP was a lil concerned  Are you experiencing any other symptoms? No

## 2023-12-18 NOTE — Progress Notes (Signed)
 Patient Office Visit  Assessment & Plan:  Diarrhea, unspecified type -     Ondansetron  HCl; Take 1 tablet (4 mg total) by mouth every 8 (eight) hours as needed for nausea or vomiting.  Dispense: 31 tablet; Refill: 0  Allergic rhinitis -     Ondansetron  HCl; Take 1 tablet (4 mg total) by mouth every 8 (eight) hours as needed for nausea or vomiting.  Dispense: 31 tablet; Refill: 0 -     Azelastine  HCl; Place 1 spray into both nostrils 2 (two) times daily. Use in each nostril as directed  Dispense: 30 mL; Refill: 5 -     predniSONE ; Use as directed.  Dispense: 21 each; Refill: 0  Bradycardia  Gastroesophageal reflux disease without esophagitis  Other insomnia -     Mirtazapine ; Take 1 tablet (7.5 mg total) by mouth at bedtime.  Dispense: 90 tablet; Refill: 0   Stay well-hydrated and eat small amounts throughout the day.  If the diarrhea does not improve he will notify us .  Astelin  nasal spray sent to the pharmacy.  Prednisone  taper x 6 days.  He will monitor glucose closely the next several days.  Continue the Allegra  and Pataday  eyedrops.  Remeron  7.5 mg at nighttime to see if this will help with insomnia. If this is ineffective will consider another medication  Will continue to monitor his heart rate and if it stays in the 40s we will need to contact his cardiologist regarding this.  Patient is aware that the Coreg  is contributing to this.  No follow-ups on file.   Subjective:    Patient ID: Cameron Daniel, male    DOB: 1946/03/18  Age: 78 y.o. MRN: [id number]  Chief Complaint  Patient presents with   Diarrhea    X1 day.   Eye Drainage    Pt was around cats this weekend and he is allergic. He has noticed swelling in his eyes.     Diarrhea   Diarrhea- had loose stools 2 times this AM, ate honey baked [place] yesterday and he and is wife both got sick. Wife having diarrhea at home right now but no one else got sick. Pt having nausea but no vomiting. Today he has not been eating  very much, has decreased appetite but feaful if he eats he may have diarrhea. Pt has been trying to drink water . No fever or chills. No abdominal pain. Pt had partial colectomy years ago. Had cholecystectomy years ago Allergic Rhinitis: took Claritin  this AM. worsening allergic rhinitis the past several weeks. Patient's symptoms include cough, headaches, nasal congestion, postnasal drip, sinus pressure and sneezing. These symptoms are seasonal. Current triggers include exposure to pollens and cats this past weekend. The patient has been suffering from these symptoms for several weeks but worse the past few days. Has flonase  at home and used it today. Has allegra  he takes during allergy  season. Uses Pataday  for eyes.  Patient did do allergy  shots years ago but they were not helpful. GERD- has been burping on and off the past few days. Pt had a spell of this before which eventually went away. Pt has no gallbladder and had colon resection. pt had endoscopy before but does not recall when. Pt has no hx of ulcers. Pt taking Protonix  40mg  BID.  Bradycardia-heart rate in the 40s today.  Patient states he saw the cardiologist on Friday and heart rate was in the 60s.  Patient did mention that the heart rate does go down in the 40s  but then goes up in the 60's; per patient cardiologist did not seem concerned.  Patient not having chest pain or shortness of breath however gets a little lightheaded if he gets up too fast.  Patient does take Coreg  3.125 twice a day. Patient does have ICD. Last ECHO revealed EF 35-40% Insomnia-patient is a [place] Vet, has been having very vivid and worrisome dreams.  Patient has not taken anything for the insomnia.  Patient is interested in trying something to help him sleep.  Patient does not want to feel groggy the next day. The ASCVD Risk score (Arnett DK, et al., 2019) failed to calculate for the following reasons:   Risk score cannot be calculated because patient has a medical history  suggesting prior/existing ASCVD  Past Medical History:  Diagnosis Date   Allergic rhinitis, cause unspecified    Allergy     Anxiety    Arthritis    Cancer (HCC)    a couple places removed from eyelid   Carotid Doppler 04/2020   Carotid US  9/21: No evidence of ICA stenosis bilaterally; right vertebral artery stenosis   Cataract    Chronic combined systolic (congestive) and diastolic (congestive) heart failure (HCC)    Coronary atherosclerosis of unspecified type of vessel, native or graft    a. MI 1985 with unclear details. b. CABG 03/2019 with LIMA -> LAD, SVG -> OM, SVG to dRCA.   Depression    PTSD    Diverticulosis of colon (without mention of hemorrhage)    Esophageal reflux    Esophageal stricture    Essential hypertension    Former tobacco use    Hemorrhoids    Hyperlipidemia    ICD (implantable cardioverter-defibrillator) in place    Irritable bowel syndrome    Ischemic cardiomyopathy    Mild carotid artery disease (HCC)    a. 1-39% bilaterally 03/2019 duplex.   Myocardial infarction (HCC)    Nocturia    Obesity, unspecified    Orthostasis    Other acquired absence of organ    Personal history of colonic polyps    Psychosexual dysfunction with inhibited sexual excitement    Retroperitoneal bleed    Type II or unspecified type diabetes mellitus without mention of complication, not stated as uncontrolled    VT (ventricular tachycardia) (HCC)    Past Surgical History:  Procedure Laterality Date   ADENOIDECTOMY     CARPAL TUNNEL RELEASE     left    CHOLECYSTECTOMY     COLON RESECTION     18 inches   COLON SURGERY  2013   CORONARY ANGIOPLASTY     1985    CORONARY ARTERY BYPASS GRAFT N/A 04/26/2019   Procedure: CORONARY ARTERY BYPASS GRAFTING (CABG) x Three, using left internal mammary artery and right leg greater saphenous vein harvested endoscopically;  Surgeon: Bartley Lightning, MD;  Location: [facility] OR;  Service: Open Heart Surgery;  Laterality: N/A;   ELBOW BURSA  SURGERY     EYE SURGERY     ICD IMPLANT N/A 06/25/2020   Procedure: ICD IMPLANT;  Surgeon: Boyce Byes, MD;  Location: [facility] INVASIVE CV LAB;  Service: Cardiovascular;  Laterality: N/A;   LEFT HEART CATH AND CORS/GRAFTS ANGIOGRAPHY N/A 06/19/2020   Procedure: LEFT HEART CATH AND CORS/GRAFTS ANGIOGRAPHY;  Surgeon: Millicent Ally, MD;  Location: [facility] INVASIVE CV LAB;  Service: Cardiovascular;  Laterality: N/A;   LEFT HEART CATH AND CORS/GRAFTS ANGIOGRAPHY N/A 03/08/2023   Procedure: LEFT HEART CATH AND CORS/GRAFTS ANGIOGRAPHY;  Surgeon: Wenona Hamilton, MD;  Location: [facility] INVASIVE CV LAB;  Service: Cardiovascular;  Laterality: N/A;   POLYPECTOMY     RIGHT/LEFT HEART CATH AND CORONARY ANGIOGRAPHY N/A 04/16/2019   Procedure: RIGHT/LEFT HEART CATH AND CORONARY ANGIOGRAPHY;  Surgeon: Arty Binning, MD;  Location: [facility] INVASIVE CV LAB;  Service: Cardiovascular;  Laterality: N/A;   SHOULDER SURGERY     Rt. Shoulder   TEE WITHOUT CARDIOVERSION N/A 04/26/2019   Procedure: TRANSESOPHAGEAL ECHOCARDIOGRAM (TEE);  Surgeon: Bartley Lightning, MD;  Location: [facility] OR;  Service: Open Heart Surgery;  Laterality: N/A;   TONSILLECTOMY     V TACH ABLATION N/A 06/22/2020   Procedure: V TACH ABLATION;  Surgeon: Boyce Byes, MD;  Location: [facility] INVASIVE CV LAB;  Service: Cardiovascular;  Laterality: N/A;   Social History   Tobacco Use   Smoking status: Former    Current packs/day: 0.00    Types: Cigarettes    Quit date: 08/30/1983    Years since quitting: 40.3   Smokeless tobacco: Former   Tobacco comments:    Quit 30 years ago.  Vaping Use   Vaping status: Never Used  Substance Use Topics   Alcohol use: Yes    Comment: rare   Drug use: No   Family History  Problem Relation Age of Onset   Lung cancer Father    Allergic rhinitis Father    Coronary artery disease Mother    Colon cancer Neg Hx    Heart attack Neg Hx    Hypertension Neg Hx    Stroke Neg Hx    Esophageal cancer Neg Hx    Rectal  cancer Neg Hx    Stomach cancer Neg Hx    Pancreatic cancer Neg Hx    Allergies  Allergen Reactions   Doxazosin Mesylate Other (See Comments)    Drops in blood pressure    Losartan      Other reaction(s): Muscle pain   Testosterone      Other reaction(s): Erythrocytosis Other reaction(s): Erythrocytosis   Flomax  [Tamsulosin  Hcl] Itching and Other (See Comments)    Drops in blood pressure     Penicillins Rash and Other (See Comments)    Has patient had a PCN reaction causing immediate rash, facial/tongue/throat swelling, SOB or lightheadedness with hypotension: No Has patient had a PCN reaction causing severe rash involving mucus membranes or skin necrosis: No Has patient had a PCN reaction that required hospitalization No Has patient had a PCN reaction occurring within the last 10 years: No If all of the above answers are "NO", then may proceed with Cephalosporin use.    Sulfonamide Derivatives Rash   Trulicity  [Dulaglutide ] Nausea And Vomiting    N&V, Dizziness    Review of Systems  Gastrointestinal:  Positive for diarrhea.      Objective:    BP 102/62   Pulse (!) 43   Temp 98.8 F (37.1 C)   Ht 5' 10.5" (1.791 m)   Wt 189 lb 4 oz (85.8 kg)   SpO2 98%   BMI 26.77 kg/m  BP Readings from Last 3 Encounters:  12/18/23 102/62  12/15/23 98/60  11/29/23 104/60   Wt Readings from Last 3 Encounters:  12/18/23 189 lb 4 oz (85.8 kg)  12/15/23 190 lb (86.2 kg)  11/29/23 184 lb 9.6 oz (83.7 kg)    Physical Exam Vitals and nursing note reviewed.  Constitutional:      Appearance: Normal appearance.  HENT:     Head: Normocephalic.  Right Ear: Tympanic membrane and ear canal normal.     Left Ear: Tympanic membrane and ear canal normal.  Eyes:     Extraocular Movements: Extraocular movements intact.     Pupils: Pupils are equal, round, and reactive to light.  Cardiovascular:     Rate and Rhythm: Regular rhythm. Bradycardia present.     Heart sounds: Murmur heard.   Pulmonary:     Effort: Pulmonary effort is normal.     Breath sounds: Normal breath sounds. No wheezing.  Abdominal:     General: Bowel sounds are normal.     Tenderness: There is no abdominal tenderness. There is no guarding or rebound.     Comments: Vertical surgical scar noted from CABG and colectomy  Neurological:     General: No focal deficit present.     Mental Status: He is alert and oriented to person, place, and time.  Psychiatric:        Mood and Affect: Mood normal.        Behavior: Behavior normal.      No results found for any visits on 12/18/23.

## 2023-12-18 NOTE — Telephone Encounter (Signed)
 Patient identification verified by 2 forms.   Called and spoke to patient  Patient states:  -Heart rate 43 at doctor's this morning.  -Took his medications this morning -Lightheadedness/dizziness at times -This has been going on off and 2-2.5 weeks, this weekend was constant.  -My legs feel heavy when my heart rate drops in the 40s.   Patient denies:  -Fainting             Interventions/Plan: -Spoke with with DOD Dr. Alvis Ba pt advised to continue to take his medications as prescribed.  -Zio 7 day ordered   Reviewed ED warning signs/precautions  Patient agrees with plan, no questions at this time

## 2023-12-19 ENCOUNTER — Ambulatory Visit: Attending: Cardiovascular Disease

## 2023-12-19 DIAGNOSIS — R001 Bradycardia, unspecified: Secondary | ICD-10-CM

## 2023-12-19 NOTE — Progress Notes (Unsigned)
 Enrolled for [employer] to mail a ZIO XT long term holter monitor to the patients address on file.

## 2023-12-20 NOTE — Telephone Encounter (Signed)
 Patient stated he is returning RN's call regarding next steps for his palpitations.

## 2023-12-20 NOTE — Telephone Encounter (Signed)
 Patient identification verified by 2 forms.   Called and spoke to patient  Patient states:  [phone] -Lightheaded -Tingling in legs and feet -Walk 20 yards and just give out.  -Overall weakness   Patient denies:  -Symptom improvement    Interventions/Plan: -Spoke with Dr. Rolm Clos, pt is to stop Coreg  and monitor symptoms -Pt will call back in 2-3 days if symptoms do not improve.   Reviewed ED warning signs/precautions  Patient agrees with plan, no questions at this time

## 2023-12-21 ENCOUNTER — Ambulatory Visit (INDEPENDENT_AMBULATORY_CARE_PROVIDER_SITE_OTHER): Payer: Medicare HMO

## 2023-12-21 DIAGNOSIS — R001 Bradycardia, unspecified: Secondary | ICD-10-CM | POA: Diagnosis not present

## 2023-12-21 LAB — CUP PACEART REMOTE DEVICE CHECK
Battery Remaining Longevity: 107 mo
Battery Voltage: 3.01 V
Brady Statistic RV Percent Paced: 5.95 %
Date Time Interrogation Session: 20250424012405
HighPow Impedance: 68 Ohm
Implantable Lead Connection Status: 753985
Implantable Lead Implant Date: 20211028
Implantable Lead Location: 753860
Implantable Pulse Generator Implant Date: 20211028
Lead Channel Impedance Value: 380 Ohm
Lead Channel Impedance Value: 437 Ohm
Lead Channel Pacing Threshold Amplitude: 0.5 V
Lead Channel Pacing Threshold Pulse Width: 0.4 ms
Lead Channel Sensing Intrinsic Amplitude: 9.25 mV
Lead Channel Sensing Intrinsic Amplitude: 9.25 mV
Lead Channel Setting Pacing Amplitude: 2 V
Lead Channel Setting Pacing Pulse Width: 0.4 ms
Lead Channel Setting Sensing Sensitivity: 0.3 mV
Zone Setting Status: 755011
Zone Setting Status: 755011

## 2023-12-22 ENCOUNTER — Emergency Department (HOSPITAL_COMMUNITY)

## 2023-12-22 ENCOUNTER — Other Ambulatory Visit: Payer: Self-pay

## 2023-12-22 ENCOUNTER — Observation Stay (HOSPITAL_COMMUNITY)
Admission: EM | Admit: 2023-12-22 | Discharge: 2023-12-23 | Disposition: A | Discharge status: Home / Self Care | Attending: Emergency Medicine | Admitting: Emergency Medicine

## 2023-12-22 ENCOUNTER — Ambulatory Visit: Payer: Self-pay

## 2023-12-22 DIAGNOSIS — Z79899 Other long term (current) drug therapy: Secondary | ICD-10-CM | POA: Insufficient documentation

## 2023-12-22 DIAGNOSIS — Z95810 Presence of automatic (implantable) cardiac defibrillator: Secondary | ICD-10-CM | POA: Insufficient documentation

## 2023-12-22 DIAGNOSIS — E1169 Type 2 diabetes mellitus with other specified complication: Secondary | ICD-10-CM

## 2023-12-22 DIAGNOSIS — R0602 Shortness of breath: Secondary | ICD-10-CM | POA: Diagnosis not present

## 2023-12-22 DIAGNOSIS — F17210 Nicotine dependence, cigarettes, uncomplicated: Secondary | ICD-10-CM | POA: Diagnosis not present

## 2023-12-22 DIAGNOSIS — R5381 Other malaise: Secondary | ICD-10-CM | POA: Diagnosis present

## 2023-12-22 DIAGNOSIS — I472 Ventricular tachycardia, unspecified: Secondary | ICD-10-CM | POA: Diagnosis not present

## 2023-12-22 DIAGNOSIS — I5020 Unspecified systolic (congestive) heart failure: Secondary | ICD-10-CM | POA: Diagnosis present

## 2023-12-22 DIAGNOSIS — Z7982 Long term (current) use of aspirin: Secondary | ICD-10-CM | POA: Diagnosis not present

## 2023-12-22 DIAGNOSIS — Z8589 Personal history of malignant neoplasm of other organs and systems: Secondary | ICD-10-CM | POA: Diagnosis not present

## 2023-12-22 DIAGNOSIS — I442 Atrioventricular block, complete: Secondary | ICD-10-CM | POA: Diagnosis not present

## 2023-12-22 DIAGNOSIS — I2510 Atherosclerotic heart disease of native coronary artery without angina pectoris: Secondary | ICD-10-CM | POA: Insufficient documentation

## 2023-12-22 DIAGNOSIS — I5042 Chronic combined systolic (congestive) and diastolic (congestive) heart failure: Secondary | ICD-10-CM | POA: Diagnosis not present

## 2023-12-22 DIAGNOSIS — E119 Type 2 diabetes mellitus without complications: Secondary | ICD-10-CM | POA: Insufficient documentation

## 2023-12-22 DIAGNOSIS — I110 Hypertensive heart disease with heart failure: Secondary | ICD-10-CM | POA: Diagnosis not present

## 2023-12-22 DIAGNOSIS — R001 Bradycardia, unspecified: Secondary | ICD-10-CM | POA: Diagnosis not present

## 2023-12-22 LAB — CBC
HCT: 46.4 % (ref 39.0–52.0)
HCT: 43.9 % (ref 39.0–52.0)
Hemoglobin: 15.4 g/dL (ref 13.0–17.0)
Hemoglobin: 14.7 g/dL (ref 13.0–17.0)
MCH: 28.8 pg (ref 26.0–34.0)
MCH: 29.2 pg (ref 26.0–34.0)
MCHC: 33.2 g/dL (ref 30.0–36.0)
MCHC: 33.5 g/dL (ref 30.0–36.0)
MCV: 86.9 fL (ref 80.0–100.0)
MCV: 87.1 fL (ref 80.0–100.0)
Platelets: 121 10*3/uL — ABNORMAL LOW (ref 150–400)
Platelets: 122 10*3/uL — ABNORMAL LOW (ref 150–400)
RBC: 5.34 MIL/uL (ref 4.22–5.81)
RBC: 5.04 MIL/uL (ref 4.22–5.81)
RDW: 13.8 % (ref 11.5–15.5)
RDW: 14 % (ref 11.5–15.5)
WBC: 8.9 10*3/uL (ref 4.0–10.5)
WBC: 7.4 10*3/uL (ref 4.0–10.5)
nRBC: 0 % (ref 0.0–0.2)
nRBC: 0 % (ref 0.0–0.2)

## 2023-12-22 LAB — CBG MONITORING, ED: Glucose-Capillary: 157 mg/dL — ABNORMAL HIGH (ref 70–99)

## 2023-12-22 LAB — URINALYSIS, ROUTINE W REFLEX MICROSCOPIC
Bacteria, UA: NONE SEEN
Bilirubin Urine: NEGATIVE
Glucose, UA: >=500 mg/dL — AB
Hgb urine dipstick: NEGATIVE
Ketones, ur: 5 mg/dL — AB
Leukocytes,Ua: NEGATIVE
Nitrite: NEGATIVE
Protein, ur: NEGATIVE mg/dL
Specific Gravity, Urine: 1.024 (ref 1.005–1.030)
pH: 5 (ref 5.0–8.0)

## 2023-12-22 LAB — CREATININE, SERUM
Creatinine, Ser: 1.11 mg/dL (ref 0.61–1.24)
GFR, Estimated: >60 mL/min (ref >60)

## 2023-12-22 LAB — BASIC METABOLIC PANEL WITH GFR
Anion gap: 13 (ref 5–15)
BUN: 15 mg/dL (ref 8–23)
CO2: 21 mmol/L — ABNORMAL LOW (ref 22–32)
Calcium: 10.3 mg/dL (ref 8.9–10.3)
Chloride: 100 mmol/L (ref 98–111)
Creatinine, Ser: 1.17 mg/dL (ref 0.61–1.24)
GFR, Estimated: >60 mL/min (ref >60)
Glucose, Bld: 134 mg/dL — ABNORMAL HIGH (ref 70–99)
Potassium: 4.1 mmol/L (ref 3.5–5.1)
Sodium: 134 mmol/L — ABNORMAL LOW (ref 135–145)

## 2023-12-22 LAB — BRAIN NATRIURETIC PEPTIDE: B Natriuretic Peptide: 1209.2 pg/mL — ABNORMAL HIGH (ref 0.0–100.0)

## 2023-12-22 MED ORDER — CHOLESTYRAMINE 4 G PO PACK
2.0000 g | PACK | Freq: Two times a day (BID) | ORAL | Status: DC
  Filled 2023-12-22 (×2): qty 1

## 2023-12-22 MED ORDER — CHOLESTYRAMINE 4 G PO PACK
2.0000 g | PACK | Freq: Two times a day (BID) | ORAL | Status: DC
  Filled 2023-12-22: qty 1

## 2023-12-22 MED ORDER — NITROGLYCERIN 0.4 MG SL SUBL: 0.4000 mg | SUBLINGUAL_TABLET | SUBLINGUAL | Status: DC | PRN

## 2023-12-22 MED ORDER — ROSUVASTATIN CALCIUM 20 MG PO TABS
40.0000 mg | ORAL_TABLET | Freq: Every day | ORAL | Status: DC
  Administered 2023-12-22 – 2023-12-23 (×2): 40 mg via ORAL
  Filled 2023-12-22 (×2): qty 2

## 2023-12-22 MED ORDER — SPIRONOLACTONE 12.5 MG HALF TABLET
12.5000 mg | ORAL_TABLET | Freq: Every day | ORAL | Status: DC
  Administered 2023-12-23: 12.5 mg via ORAL
  Filled 2023-12-22: qty 1

## 2023-12-22 MED ORDER — ASPIRIN 81 MG PO TBEC
81.0000 mg | DELAYED_RELEASE_TABLET | Freq: Every day | ORAL | Status: DC
  Administered 2023-12-23: 81 mg via ORAL
  Filled 2023-12-22: qty 1

## 2023-12-22 MED ORDER — CETIRIZINE HCL 0.24 % OP SOLN: 1.0000 [drp] | Freq: Two times a day (BID) | OPHTHALMIC | Status: DC

## 2023-12-22 MED ORDER — ACETAMINOPHEN 325 MG PO TABS: 650.0000 mg | ORAL_TABLET | ORAL | Status: DC | PRN

## 2023-12-22 MED ORDER — SODIUM CHLORIDE 0.9 % IV SOLN: INTRAVENOUS | Status: DC

## 2023-12-22 MED ORDER — PANTOPRAZOLE SODIUM 40 MG PO TBEC
40.0000 mg | DELAYED_RELEASE_TABLET | Freq: Two times a day (BID) | ORAL | Status: DC
  Administered 2023-12-22 – 2023-12-23 (×2): 40 mg via ORAL
  Filled 2023-12-22 (×2): qty 1

## 2023-12-22 MED ORDER — MIRTAZAPINE 15 MG PO TABS
7.5000 mg | ORAL_TABLET | Freq: Every day | ORAL | Status: DC
  Administered 2023-12-22: 7.5 mg via ORAL
  Filled 2023-12-22: qty 1

## 2023-12-22 MED ORDER — KETOTIFEN FUMARATE 0.035 % OP SOLN
1.0000 [drp] | Freq: Two times a day (BID) | OPHTHALMIC | Status: DC
  Administered 2023-12-23: 1 [drp] via OPHTHALMIC
  Filled 2023-12-22: qty 5

## 2023-12-22 MED ORDER — CITALOPRAM HYDROBROMIDE 10 MG PO TABS
10.0000 mg | ORAL_TABLET | Freq: Every day | ORAL | Status: DC
  Administered 2023-12-22 – 2023-12-23 (×2): 10 mg via ORAL
  Filled 2023-12-22 (×2): qty 1

## 2023-12-22 MED ORDER — FINASTERIDE 5 MG PO TABS
5.0000 mg | ORAL_TABLET | Freq: Every day | ORAL | Status: DC
  Administered 2023-12-23: 5 mg via ORAL
  Filled 2023-12-22: qty 1

## 2023-12-22 MED ORDER — MONTELUKAST SODIUM 10 MG PO TABS
10.0000 mg | ORAL_TABLET | Freq: Every day | ORAL | Status: DC
  Administered 2023-12-22: 10 mg via ORAL
  Filled 2023-12-22: qty 1

## 2023-12-22 MED ORDER — ENOXAPARIN SODIUM 40 MG/0.4ML IJ SOSY
40.0000 mg | PREFILLED_SYRINGE | INTRAMUSCULAR | Status: DC
  Administered 2023-12-22: 40 mg via SUBCUTANEOUS
  Filled 2023-12-22: qty 0.4

## 2023-12-22 MED ORDER — EMPAGLIFLOZIN 25 MG PO TABS
25.0000 mg | ORAL_TABLET | Freq: Every day | ORAL | Status: DC
  Administered 2023-12-23: 25 mg via ORAL
  Filled 2023-12-22: qty 1

## 2023-12-22 MED ORDER — LINAGLIPTIN 5 MG PO TABS
5.0000 mg | ORAL_TABLET | Freq: Every day | ORAL | Status: DC
  Administered 2023-12-23: 5 mg via ORAL
  Filled 2023-12-22: qty 1

## 2023-12-22 MED ORDER — POLYETHYLENE GLYCOL 3350 17 G PO PACK
17.0000 g | PACK | Freq: Once | ORAL | Status: AC
  Administered 2023-12-22: 17 g via ORAL
  Filled 2023-12-22: qty 1

## 2023-12-22 MED ORDER — ENSURE ENLIVE PO LIQD
237.0000 mL | Freq: Two times a day (BID) | ORAL | Status: DC
  Administered 2023-12-23: 237 mL via ORAL

## 2023-12-22 NOTE — ED Notes (Signed)
 Pt ambulated to bathroom with assistance.

## 2023-12-22 NOTE — Telephone Encounter (Signed)
 Copied from CRM [id number]. Topic: Clinical - Red Word Triage >> Dec 22, 2023  9:10 AM Zipporah Him wrote: Red Word that prompted transfer to Nurse Triage: Patient states his heart rate, in the 40s. Wanting some advise. Constipated for about 3 days and having discomfort.   Chief Complaint: Low Heart Rate Symptoms: Constipation, Dizziness, Weakness Frequency: 4-5 Days Pertinent Negatives: Patient denies chest pain, blurred vision, fainting  Disposition: [x] ED /[] Urgent Care (no appt availability in office) / [] Appointment(In office/virtual)/ []  [facility] [facility]/ [] Home Care/ [] Refused Recommended Disposition /[] [place] [facility]/ []  Follow-up with PCP  Additional Notes: [place] is being triaged for constipation and a low heart rate. The patient has a defibrillator and reports his heart rate was 43 this morning. The patient states his heart rate averaged in the 60's before this issue. Blood pressure was WNL. Additionally reports some dizziness and weakness when he standing. The patient states he has not been to the bathroom in 3 days which is not normal. Believes constipation is the cause of this new issue, along with Cardiology's suggestion. While on the phone, the patient's heart rate measured to be 45. Blood pressure is 121/62. Reports some dyspnea with exertion as well. Recommended the patient seek the nearest ED for prompt evaluation and treatment for safety as these symptoms are acute. Patient verbalized understanding and agreed to disposition.   Reason for Disposition  Difficulty breathing  Dizziness, lightheadedness, or weakness  Answer Assessment - Initial Assessment Questions 1. DESCRIPTION: "Please describe your heart rate or heartbeat that you are having" (e.g., fast/slow, regular/irregular, skipped or extra beats, "palpitations")     Slow or Low.  2. ONSET: "When did it start?" (Minutes, hours or days)      A couple of days  3. DURATION: "How long does it last" (e.g., seconds,  minutes, hours)     A couple of days  6. HEART RATE: "Can you tell me your heart rate?" "How many beats in 15 seconds?"  (Note: not all patients can do this)       40's, 43 this morning, Defibrillator present  7. RECURRENT SYMPTOM: "Have you ever had this before?" If Yes, ask: "When was the last time?" and "What happened that time?"      No  8. CAUSE: "What do you think is causing the palpitations?"     Constipation  9. CARDIAC HISTORY: "Do you have any history of heart disease?" (e.g., heart attack, angina, bypass surgery, angioplasty, arrhythmia)      No  10. OTHER SYMPTOMS: "Do you have any other symptoms?" (e.g., dizziness, chest pain, sweating, difficulty breathing)       Lightheaded, Weakness (Legs)  Protocols used: Heart Rate and Heartbeat Questions-A-AH

## 2023-12-22 NOTE — ED Notes (Signed)
 CCM called to admit pt on tele.

## 2023-12-22 NOTE — Telephone Encounter (Signed)
 Device Transmission reviewed from 4/24. Patient on his way to ER now due to symptoms - unable to send transmission today. Noted significant increase in VP to maintain HR in 40's. Optivol trending up/thoracic impedance down. Heart rate variability significant.   Unsure what is brewing for patient but I did confirm with him that going to ER is a good plan at this point.

## 2023-12-22 NOTE — ED Provider Notes (Signed)
 [place] [facility] Provider Note   CSN: [id number] Arrival date & time: 12/22/23  1029     History  Chief Complaint  Patient presents with   Bradycardia   Fatigue   Shortness of Breath    Cameron Daniel is a 78 y.o. male.  HPI Patient presents with his wife who assists with history.  He complains of fatigue, weakness.  Onset was within the past few days, following unremarkable recent planned outpatient cardiology follow-up.  Patient's history is notable for ICD placement, prior CABG.  No pain currently.  Notably the patient recently stopped his beta-blocker, per cardiology.    Home Medications Prior to Admission medications   Medication Sig Start Date End Date Taking? Authorizing Provider  acetaminophen  (TYLENOL ) 500 MG tablet Take 1,000 mg by mouth at bedtime as needed for mild pain.   Yes [provider]  aspirin  EC 81 MG tablet Take 1 tablet (81 mg total) by mouth daily. 05/16/19  Yes Dunn, Dayna N, PA-C  azelastine  (ASTELIN ) 0.1 % nasal spray Place 2 sprays into both nostrils 2 (two) times daily. Use in each nostril as directed Patient taking differently: Place 2 sprays into both nostrils daily as needed for allergies or rhinitis. Use in each nostril as directed 02/12/21  Yes Pickard, Cisco Crest, MD  carvedilol  (COREG ) 3.125 MG tablet Take 3.125 mg by mouth 2 (two) times daily with a meal.   Yes [provider]  Cholecalciferol (VITAMIN D -3 PO) Take 1 tablet by mouth daily.   Yes [provider]  cholestyramine  (QUESTRAN ) 4 g packet Take 0.5 packets (2 g total) by mouth 2 (two) times daily. 08/15/23  Yes Austine Lefort, MD  citalopram  (CELEXA ) 10 MG tablet Take 1 tablet (10 mg total) by mouth daily. 06/02/23  Yes Austine Lefort, MD  clotrimazole  (LOTRIMIN ) 1 % cream Apply 1 Application topically in the morning, at noon, and at bedtime. 08/01/23  Yes Austine Lefort, MD  diphenoxylate -atropine  (LOMOTIL )  2.5-0.025 MG tablet Take 2 tablets by mouth 4 (four) times daily as needed for diarrhea or loose stools. 07/30/21  Yes Austine Lefort, MD  empagliflozin  (JARDIANCE ) 25 MG TABS tablet TAKE 25 MG (1 TABLET) BY MOUTH DAILY BEFORE BREAKFAST. STOP PIOGLITAZONE  03/24/23  Yes Conte, Tessa N, PA-C  fexofenadine  (ALLEGRA ) 180 MG tablet Take 1 tablet (180 mg total) by mouth daily as needed. For allergies Patient taking differently: Take 180 mg by mouth daily. For allergies 05/28/18  Yes Bobbitt, Colen Daunt, MD  finasteride  (PROSCAR ) 5 MG tablet TAKE 1 TABLET BY MOUTH EVERY DAY 03/17/23  Yes Austine Lefort, MD  hydrocortisone  (ANUSOL -HC) 2.5 % rectal cream Place 1 Application rectally daily as needed for hemorrhoids or anal itching. 06/02/23  Yes Austine Lefort, MD  MAGNESIUM  PO Take 1 tablet by mouth daily.   Yes [provider]  montelukast  (SINGULAIR ) 10 MG tablet TAKE 1 TABLET BY MOUTH EVERYDAY AT BEDTIME 10/09/23  Yes Austine Lefort, MD  olopatadine  (PATADAY ) 0.1 % ophthalmic solution Place 1 drop into both eyes daily as needed for allergies.   Yes [provider]  ondansetron  (ZOFRAN ) 4 MG tablet Take 1 tablet (4 mg total) by mouth every 8 (eight) hours as needed for nausea or vomiting. 12/18/23  Yes Amadeo June, MD  pantoprazole  (PROTONIX ) 40 MG tablet TAKE 1 TABLET BY MOUTH TWICE A DAY 10/09/23  Yes Austine Lefort, MD  rosuvastatin  (CRESTOR ) 40 MG tablet TAKE 1  TABLET BY MOUTH DAILY AT 6 PM. 10/19/23  Yes Austine Lefort, MD  sitaGLIPtin  (JANUVIA ) 100 MG tablet Take 100 mg by mouth daily.   Yes [provider]  spironolactone  (ALDACTONE ) 25 MG tablet Take 0.5 tablets (12.5 mg total) by mouth daily. 04/18/23  Yes O'Neal, Cathay Clonts, MD  vitamin B-12 (CYANOCOBALAMIN ) 100 MCG tablet Take 100 mcg by mouth daily.   Yes [provider]  azelastine  (ASTELIN ) 0.1 % nasal spray Place 1 spray into both nostrils 2 (two) times daily. Use in each nostril as  directed Patient not taking: Reported on 12/22/2023 12/18/23   Amadeo June, MD  Cetirizine  HCl (ZERVIATE ) 0.24 % SOLN Apply 1 drop to eye 2 (two) times daily. 06/28/22   Austine Lefort, MD  metroNIDAZOLE  (FLAGYL ) 250 MG tablet Take 1 tablet (250 mg total) by mouth 3 (three) times daily. Patient not taking: Reported on 12/22/2023 08/16/23   Graciella Lavender, PA  mirtazapine  (REMERON ) 7.5 MG tablet Take 1 tablet (7.5 mg total) by mouth at bedtime. Patient not taking: Reported on 12/22/2023 12/18/23   Amadeo June, MD  neomycin-polymyxin b -dexamethasone  (MAXITROL) 3.5-10000-0.1 OINT Place 1 Application into both eyes 3 (three) times daily. Patient not taking: Reported on 12/22/2023 04/12/23   [provider]  predniSONE  (DELTASONE ) 20 MG tablet 3 tabs poqday 1-2, 2 tabs poqday 3-4, 1 tab poqday 5-6 Patient not taking: Reported on 12/22/2023 11/16/23   Austine Lefort, MD  predniSONE  (STERAPRED UNI-PAK 21 TAB) 10 MG (21) TBPK tablet Use as directed. Patient not taking: Reported on 12/22/2023 12/18/23   Amadeo June, MD      Allergies    Doxazosin mesylate, Losartan , Testosterone , Flomax  [tamsulosin  hcl], Penicillins, Sulfonamide derivatives, and Trulicity  [dulaglutide ]    Review of Systems   Review of Systems  Physical Exam Updated Vital Signs BP 118/69   Pulse 61   Temp 97.9 F (36.6 C) (Oral)   Resp (!) 21   SpO2 100%  Physical Exam Vitals and nursing note reviewed.  Constitutional:      General: He is not in acute distress.    Appearance: He is well-developed.  HENT:     Head: Normocephalic and atraumatic.  Eyes:     Conjunctiva/sclera: Conjunctivae normal.  Cardiovascular:     Rate and Rhythm: Regular rhythm. Bradycardia present.  Pulmonary:     Effort: Pulmonary effort is normal. No respiratory distress.     Breath sounds: No stridor.  Abdominal:     General: There is no distension.  Skin:    General: Skin is warm and dry.  Neurological:     Mental  Status: He is alert and oriented to person, place, and time.     ED Results / Procedures / Treatments   Labs (all labs ordered are listed, but only abnormal results are displayed) Labs Reviewed  BASIC METABOLIC PANEL WITH GFR - Abnormal; Notable for the following components:      Result Value   Sodium 134 (*)    CO2 21 (*)    Glucose, Bld 134 (*)    All other components within normal limits  CBC - Abnormal; Notable for the following components:   Platelets 121 (*)    All other components within normal limits  URINALYSIS, ROUTINE W REFLEX MICROSCOPIC - Abnormal; Notable for the following components:   Glucose, UA >=500 (*)    Ketones, ur 5 (*)    All other components within normal limits  BRAIN NATRIURETIC PEPTIDE - Abnormal; Notable  for the following components:   B Natriuretic Peptide 1,209.2 (*)    All other components within normal limits  CBC - Abnormal; Notable for the following components:   Platelets 122 (*)    All other components within normal limits  CBG MONITORING, ED - Abnormal; Notable for the following components:   Glucose-Capillary 157 (*)    All other components within normal limits  CREATININE, SERUM    EKG Regular bradycardia, wide-complex, rate 40, abnormal  Radiology DG Chest 2 View Result Date: 12/22/2023 CLINICAL DATA:  Bradycardia and shortness of breath EXAM: CHEST - 2 VIEW COMPARISON:  Chest radiograph dated 08/02/2022 FINDINGS: Lines/tubes: Left chest wall pacemaker lead projects over the right ventricle. Lungs: Well inflated lungs. No focal consolidation. Pleura: No pneumothorax or pleural effusion. Heart/mediastinum: Similar mildly enlarged cardiomediastinal silhouette. Bones: Median sternotomy wires are nondisplaced. Chronic appearance of the right clavicle. IMPRESSION: 1. No active cardiopulmonary disease. 2. Similar mild cardiomegaly. Electronically Signed   By: Limin  Xu M.D.   On: 12/22/2023 11:39   CUP PACEART REMOTE DEVICE CHECK Result  Date: 12/21/2023 ICD scheduled remote reviewed. Normal device function.  Presenting rhythm: VP/VS No new events since in office check on 11/29/23. Next remote 91 days. AB, CVRS   Procedures Procedures    Medications Ordered in ED Medications  0.9 %  sodium chloride  infusion ( Intravenous New Bag/Given 12/22/23 1148)  aspirin  EC tablet 81 mg (has no administration in time range)  nitroGLYCERIN  (NITROSTAT ) SL tablet 0.4 mg (has no administration in time range)  acetaminophen  (TYLENOL ) tablet 650 mg (has no administration in time range)  enoxaparin  (LOVENOX ) injection 40 mg (40 mg Subcutaneous Given 12/22/23 1501)  polyethylene glycol (MIRALAX  / GLYCOLAX ) packet 17 g (17 g Oral Given 12/22/23 1500)    ED Course/ Medical Decision Making/ A&P                                 Medical Decision Making Adult male with multiple cardiac pathologies including placement of cardiac defibrillator, prior V. tach, CABG, MI, now presents with fatigue, son to be bradycardic.  Concern for complete heart block versus abnormal pacemaker function versus infection versus electrolyte abnormalities. Cardiac 40 wide-complex abnormal Pulse ox 99% room air normal Case discussed with cardiology soon after the patient's arrival.  Amount and/or Complexity of Data Reviewed Independent Historian: spouse External Data Reviewed: notes.    Details: Recent cardiology visit reviewed, today's pacemaker check remote reviewed Labs: ordered. Decision-making details documented in ED Course. Radiology: ordered and independent interpretation performed. Decision-making details documented in ED Course. ECG/medicine tests: ordered and independent interpretation performed. Decision-making details documented in ED Course.  Risk Prescription drug management. Decision regarding hospitalization. Diagnosis or treatment significantly limited by social determinants of health.   Update: I discussed patient's case with our cardiology  colleagues, he was concern for complete heart block versus symptomatic bradycardia patient be admitted to the cardiology team   CRITICAL CARE Performed by: Dorenda Gandy Total critical care time: 35 minutes Critical care time was exclusive of separately billable procedures and treating other patients. Critical care was necessary to treat or prevent imminent or life-threatening deterioration. Critical care was time spent personally by me on the following activities: development of treatment plan with patient and/or surrogate as well as nursing, discussions with consultants, evaluation of patient's response to treatment, examination of patient, obtaining history from patient or surrogate, ordering and performing treatments and interventions, ordering and  review of laboratory studies, ordering and review of radiographic studies, pulse oximetry and re-evaluation of patient's condition.   Final Clinical Impression(s) / ED Diagnoses Final diagnoses:  Complete heart block [facility])    Rx / DC Orders ED Discharge Orders     None         Dorenda Gandy, MD 12/22/23 1559

## 2023-12-22 NOTE — ED Provider Triage Note (Signed)
 Emergency Medicine Provider Triage Evaluation Note  Cameron Daniel , a 78 y.o. male  was evaluated in triage.  Pt complains of heart rate being slow.  Patient has an ICD.  He noted on Monday at his primary care doctor that his heart rate was in the 40s.  Contacted cardiology it looks like they may have done an interrogation and his heart rate is remaining in the 40s.  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  It does appear that the ICD is keeping the heart rate around 40.  Patient tolerating a pretty well..  Review of Systems  Positive: Slow heart rate Negative: No chest pain  Physical Exam  BP 137/65 (BP Location: Right Arm)   Pulse (!) 44   Resp 17   SpO2 99%  Gen:  Awake, no distress   Resp: Normal effort  Heart: Bradycardic MSK:  Moves extremities without difficulty  Other:   Medical Decision Making  Medically screening exam initiated at 10:42 AM.  Appropriate orders placed.  Cameron Daniel was informed that the remainder of the evaluation will be completed by another provider, this initial triage assessment does not replace that evaluation, and the importance of remaining in the ED until their evaluation is complete.  EKG probably is complete heart block.  It is just that the ICD is preventing it from going below 40.  Patient will be moved back to [place]  part of the ED.     Lutisha Knoche, MD 12/22/23 1053

## 2023-12-22 NOTE — H&P (Signed)
 H&P   Patient ID: Cameron Daniel MRN: [id number]; DOB: 03-26-1946  Admit date: 12/22/2023 Date of Consult: 12/22/2023  PCP:  Austine Lefort, MD   [name] Providers Cardiologist: Dr. Elodia Hailstone EP: Dr. Marven Slimmer        Patient Profile:   Cameron Daniel is a 78 y.o. male with a hx of  CAD (CABG 2020) ICM, chronic CHF HTN, DM SVT VT ICD  who is being seen 12/22/2023 for the evaluation of CHB at the request of Dr. Liam Redhead.  Arrhythmia/AAD hx VT 06/22/2020: EPS/ablation CONCLUSIONS: 1. Sinus rhythm upon presentation   2. Clinical VT noninducible. 3. Nonclinical VT induced and was not hemodynamically tolerated (positive precordial concordance, inferior axis, CL ) 4. Successful scar modification along the mid inferior wall extending to the lateral wall 5. A large inferior and inferolateral LV scar on ICE and 3d electroanatomic mapping.  Amiodarone  briefly 2021 while inpatient  Device information MDT single chamber ICD implanted 06/25/20 Secondary prevention  History of Present Illness:   Cameron Daniel saw dr. Marven Slimmer recently on 11/29/22, reports slow, brief asymptomatic VT < 30seconds, no med changes made  Saw Dr. Elodia Hailstone 12/15/22, discussed lower BPs dizzy/lightheaded wit them, orthostatic symptoms, pt also reported observed slowere HRs 49-54, more 70's Coreg  (3.125mg  BID) continued, no med changes  4/21/pt reached out with reports of slow HRs and some dizziness, device transmission noting no VT, optiVol trending upwards > pt reported going to the ER  He comes in TODAY c/o weakness, fatigue, SOB, HR 40's and reached out to cardiology Given his hx, EP was engaged  LABS K+ 4.1 BUN/Creat 15/1.17 BNP 1209 WBC 8.9 H/H 15/46 Plts 121  Home meds rate/nodal blocking: Coreg  3.125mg  BID  Remote from yesterday (12/21/23) Battery and lead measurements are good No VT RV pacing 6% (since 11/29/23)  He reports that in the last couple weeks he has  noticed his HR fluctuating but not particularly symptomatic, once when he stood quickly was transiently  He has not had syncope In the last few days though a clear decline in how he feels, unusually weak, tired, no energy, wife reports weak in the legs as well He also c/o 3 days of no BM, unusual for him  They both state he has had unintentional; weight loss on the last year or more, with a decline in appetite    Past Medical History:  Diagnosis Date   Allergic rhinitis, cause unspecified    Allergy     Anxiety    Arthritis    Cancer (HCC)    a couple places removed from eyelid   Carotid Doppler 04/2020   Carotid US  9/21: No evidence of ICA stenosis bilaterally; right vertebral artery stenosis   Cataract    Chronic combined systolic (congestive) and diastolic (congestive) heart failure (HCC)    Coronary atherosclerosis of unspecified type of vessel, native or graft    a. MI 1985 with unclear details. b. CABG 03/2019 with LIMA -> LAD, SVG -> OM, SVG to dRCA.   Depression    PTSD    Diverticulosis of colon (without mention of hemorrhage)    Esophageal reflux    Esophageal stricture    Essential hypertension    Former tobacco use    Hemorrhoids    Hyperlipidemia    ICD (implantable cardioverter-defibrillator) in place    Irritable bowel syndrome    Ischemic cardiomyopathy    Mild carotid artery disease (HCC)    a. 1-39% bilaterally 03/2019 duplex.  Myocardial infarction (HCC)    Nocturia    Obesity, unspecified    Orthostasis    Other acquired absence of organ    Personal history of colonic polyps    Psychosexual dysfunction with inhibited sexual excitement    Retroperitoneal bleed    Type II or unspecified type diabetes mellitus without mention of complication, not stated as uncontrolled    VT (ventricular tachycardia) (HCC)     Past Surgical History:  Procedure Laterality Date   ADENOIDECTOMY     CARPAL TUNNEL RELEASE     left    CHOLECYSTECTOMY     COLON RESECTION      18 inches   COLON SURGERY  2013   CORONARY ANGIOPLASTY     1985    CORONARY ARTERY BYPASS GRAFT N/A 04/26/2019   Procedure: CORONARY ARTERY BYPASS GRAFTING (CABG) x Three, using left internal mammary artery and right leg greater saphenous vein harvested endoscopically;  Surgeon: Bartley Lightning, MD;  Location: [facility] OR;  Service: Open Heart Surgery;  Laterality: N/A;   ELBOW BURSA SURGERY     EYE SURGERY     ICD IMPLANT N/A 06/25/2020   Procedure: ICD IMPLANT;  Surgeon: Boyce Byes, MD;  Location: [facility] INVASIVE CV LAB;  Service: Cardiovascular;  Laterality: N/A;   LEFT HEART CATH AND CORS/GRAFTS ANGIOGRAPHY N/A 06/19/2020   Procedure: LEFT HEART CATH AND CORS/GRAFTS ANGIOGRAPHY;  Surgeon: Millicent Ally, MD;  Location: [facility] INVASIVE CV LAB;  Service: Cardiovascular;  Laterality: N/A;   LEFT HEART CATH AND CORS/GRAFTS ANGIOGRAPHY N/A 03/08/2023   Procedure: LEFT HEART CATH AND CORS/GRAFTS ANGIOGRAPHY;  Surgeon: Wenona Hamilton, MD;  Location: [facility] INVASIVE CV LAB;  Service: Cardiovascular;  Laterality: N/A;   POLYPECTOMY     RIGHT/LEFT HEART CATH AND CORONARY ANGIOGRAPHY N/A 04/16/2019   Procedure: RIGHT/LEFT HEART CATH AND CORONARY ANGIOGRAPHY;  Surgeon: Arty Binning, MD;  Location: [facility] INVASIVE CV LAB;  Service: Cardiovascular;  Laterality: N/A;   SHOULDER SURGERY     Rt. Shoulder   TEE WITHOUT CARDIOVERSION N/A 04/26/2019   Procedure: TRANSESOPHAGEAL ECHOCARDIOGRAM (TEE);  Surgeon: Bartley Lightning, MD;  Location: [facility] OR;  Service: Open Heart Surgery;  Laterality: N/A;   TONSILLECTOMY     V TACH ABLATION N/A 06/22/2020   Procedure: V TACH ABLATION;  Surgeon: Boyce Byes, MD;  Location: [facility] INVASIVE CV LAB;  Service: Cardiovascular;  Laterality: N/A;     Home Medications:  Prior to Admission medications   Medication Sig Start Date End Date Taking? Authorizing Provider  acetaminophen  (TYLENOL ) 500 MG tablet Take 1,000 mg by mouth at bedtime as needed for mild pain.    [provider]  aspirin  EC 81 MG tablet Take 1 tablet (81 mg total) by mouth daily. 05/16/19   Dunn, Dayna N, PA-C  azelastine  (ASTELIN ) 0.1 % nasal spray Place 2 sprays into both nostrils 2 (two) times daily. Use in each nostril as directed Patient taking differently: Place 2 sprays into both nostrils daily as needed for allergies or rhinitis. Use in each nostril as directed 02/12/21   Austine Lefort, MD  azelastine  (ASTELIN ) 0.1 % nasal spray Place 1 spray into both nostrils 2 (two) times daily. Use in each nostril as directed 12/18/23   Amadeo June, MD  carvedilol  (COREG ) 6.25 MG tablet Take 0.5 tablets (3.125 mg total) by mouth 2 (two) times daily. 04/18/23   O'NealCathay Clonts, MD  Cetirizine  HCl (ZERVIATE ) 0.24 % SOLN Apply 1 drop to  eye 2 (two) times daily. 06/28/22   Austine Lefort, MD  cholestyramine  (QUESTRAN ) 4 g packet Take 0.5 packets (2 g total) by mouth 2 (two) times daily. 08/15/23   Austine Lefort, MD  citalopram  (CELEXA ) 10 MG tablet Take 1 tablet (10 mg total) by mouth daily. 06/02/23   Austine Lefort, MD  clotrimazole  (LOTRIMIN ) 1 % cream Apply 1 Application topically in the morning, at noon, and at bedtime. 08/01/23   Austine Lefort, MD  diphenoxylate -atropine  (LOMOTIL ) 2.5-0.025 MG tablet Take 2 tablets by mouth 4 (four) times daily as needed for diarrhea or loose stools. 07/30/21   Austine Lefort, MD  empagliflozin  (JARDIANCE ) 25 MG TABS tablet TAKE 25 MG (1 TABLET) BY MOUTH DAILY BEFORE BREAKFAST. STOP PIOGLITAZONE  03/24/23   Von Grumbling, PA-C  fexofenadine  (ALLEGRA ) 180 MG tablet Take 1 tablet (180 mg total) by mouth daily as needed. For allergies Patient taking differently: Take 180 mg by mouth daily. For allergies 05/28/18   Bobbitt, Colen Daunt, MD  finasteride  (PROSCAR ) 5 MG tablet TAKE 1 TABLET BY MOUTH EVERY DAY 03/17/23   Austine Lefort, MD  hydrocortisone  (ANUSOL -HC) 2.5 % rectal cream Place 1 Application rectally daily as needed for hemorrhoids  or anal itching. 06/02/23   Austine Lefort, MD  metroNIDAZOLE  (FLAGYL ) 250 MG tablet Take 1 tablet (250 mg total) by mouth 3 (three) times daily. 08/16/23   Graciella Lavender, PA  mirtazapine  (REMERON ) 7.5 MG tablet Take 1 tablet (7.5 mg total) by mouth at bedtime. 12/18/23   Amadeo June, MD  montelukast  (SINGULAIR ) 10 MG tablet TAKE 1 TABLET BY MOUTH EVERYDAY AT BEDTIME 10/09/23   Austine Lefort, MD  neomycin-polymyxin b -dexamethasone  (MAXITROL) 3.5-10000-0.1 OINT Place 1 Application into both eyes 3 (three) times daily. 04/12/23   [provider]  nystatin  (MYCOSTATIN ) 100000 UNIT/ML suspension Take 5 mLs (500,000 Units total) by mouth 4 (four) times daily. 12/12/22   Austine Lefort, MD  olopatadine  (PATADAY ) 0.1 % ophthalmic solution Place 1 drop into both eyes daily as needed for allergies.    [provider]  ondansetron  (ZOFRAN ) 4 MG tablet Take 1 tablet (4 mg total) by mouth every 8 (eight) hours as needed for nausea or vomiting. 12/18/23   Amadeo June, MD  pantoprazole  (PROTONIX ) 40 MG tablet TAKE 1 TABLET BY MOUTH TWICE A DAY 10/09/23   Austine Lefort, MD  predniSONE  (DELTASONE ) 20 MG tablet 3 tabs poqday 1-2, 2 tabs poqday 3-4, 1 tab poqday 5-6 11/16/23   Austine Lefort, MD  predniSONE  (STERAPRED UNI-PAK 21 TAB) 10 MG (21) TBPK tablet Use as directed. 12/18/23   Amadeo June, MD  rosuvastatin  (CRESTOR ) 40 MG tablet TAKE 1 TABLET BY MOUTH DAILY AT 6 PM. 10/19/23   Austine Lefort, MD  sitaGLIPtin  (JANUVIA ) 100 MG tablet Take 100 mg by mouth daily.    [provider]  spironolactone  (ALDACTONE ) 25 MG tablet Take 0.5 tablets (12.5 mg total) by mouth daily. 04/18/23   O'NealCathay Clonts, MD  vitamin B-12 (CYANOCOBALAMIN ) 100 MCG tablet Take 100 mcg by mouth daily.    [provider]    Inpatient Medications: Scheduled Meds:  Continuous Infusions:  sodium chloride  125 mL/hr at 12/22/23 1148   PRN Meds:   Allergies:     Allergies  Allergen Reactions   Doxazosin Mesylate Other (See Comments)    Drops in blood pressure    Losartan      Other reaction(s): Muscle pain  Testosterone      Other reaction(s): Erythrocytosis Other reaction(s): Erythrocytosis   Flomax  [Tamsulosin  Hcl] Itching and Other (See Comments)    Drops in blood pressure     Penicillins Rash and Other (See Comments)    Has patient had a PCN reaction causing immediate rash, facial/tongue/throat swelling, SOB or lightheadedness with hypotension: No Has patient had a PCN reaction causing severe rash involving mucus membranes or skin necrosis: No Has patient had a PCN reaction that required hospitalization No Has patient had a PCN reaction occurring within the last 10 years: No If all of the above answers are "NO", then may proceed with Cephalosporin use.    Sulfonamide Derivatives Rash   Trulicity  [Dulaglutide ] Nausea And Vomiting    N&V, Dizziness    Social History:   Social History   Socioeconomic History   Marital status: Married    Spouse name: Not on file   Number of children: 1   Years of education: 12   Highest education level: High school graduate  Occupational History   Occupation: Therapist, sports- retired  Tobacco Use   Smoking status: Former    Current packs/day: 0.00    Types: Cigarettes    Quit date: 08/30/1983    Years since quitting: 40.3   Smokeless tobacco: Former   Tobacco comments:    Quit 30 years ago.  Vaping Use   Vaping status: Never Used  Substance and Sexual Activity   Alcohol use: Yes    Comment: rare   Drug use: No   Sexual activity: Yes  Other Topics Concern   Not on file  Social History Narrative   Not on file   Social Drivers of Health   Financial Resource Strain: Low Risk  (12/22/2022)   Overall Financial Resource Strain (CARDIA)    Difficulty of Paying Living Expenses: Not hard at all  Food Insecurity: No Food Insecurity (12/22/2022)   Hunger Vital Sign    Worried About Running Out of Food  in the Last Year: Never true    Ran Out of Food in the Last Year: Never true  Transportation Needs: No Transportation Needs (12/22/2022)   PRAPARE - Administrator, Civil Service (Medical): No    Lack of Transportation (Non-Medical): No  Physical Activity: Insufficiently Active (12/22/2022)   Exercise Vital Sign    Days of Exercise per Week: 3 days    Minutes of Exercise per Session: 30 min  Stress: No Stress Concern Present (12/22/2022)   [employer] of Occupational Health - Occupational Stress Questionnaire    Feeling of Stress : Not at all  Social Connections: Moderately Isolated (12/22/2022)   Social Connection and Isolation Panel [NHANES]    Frequency of Communication with Friends and Family: More than three times a week    Frequency of Social Gatherings with Friends and Family: More than three times a week    Attends Religious Services: Never    Database administrator or Organizations: No    Attends Banker Meetings: Never    Marital Status: Married  Catering manager Violence: Not At Risk (12/22/2022)   Humiliation, Afraid, Rape, and Kick questionnaire    Fear of Current or Ex-Partner: No    Emotionally Abused: No    Physically Abused: No    Sexually Abused: No    Family History:   Family History  Problem Relation Age of Onset   Lung cancer Father    Allergic rhinitis Father    Coronary artery disease Mother  Colon cancer Neg Hx    Heart attack Neg Hx    Hypertension Neg Hx    Stroke Neg Hx    Esophageal cancer Neg Hx    Rectal cancer Neg Hx    Stomach cancer Neg Hx    Pancreatic cancer Neg Hx      ROS:  Please see the history of present illness.  All other ROS reviewed and negative.     Physical Exam/Data:   Vitals:   12/22/23 1040  BP: 137/65  Pulse: (!) 44  Resp: 17  SpO2: 99%   No intake or output data in the 24 hours ending 12/22/23 1254    12/18/2023    2:19 PM 12/15/2023   12:56 PM 11/29/2023    1:53 PM  Last 3 Weights   Weight (lbs) 189 lb 4 oz 190 lb 184 lb 9.6 oz  Weight (kg) 85.843 kg 86.183 kg 83.734 kg     There is no height or weight on file to calculate BMI.  General:  Well nourished, well developed, in no acute distress HEENT: normal Neck: no JVD Vascular: No carotid bruits; Distal pulses 2+ bilaterally Cardiac: RRR; bradycardic, no murmurs, gallops or rubs Lungs:  CTA b/l, no wheezing, rhonchi or rales  Abd: soft, nontender, no hepatomegaly  Ext: no edema Musculoskeletal:  No deformities Skin: warm and dry  Neuro:  no gross focal abnormalities noted Psych:  Normal affect   EKG:  The EKG was personally reviewed and demonstrates:   CHB, V pacing at 40 CHB V pacing 40  OLD 12/15/22 SR 62bpm, 1st degree AVBlock , LAD, RBBB  Telemetry:  Telemetry was personally reviewed and demonstrates:   CHB V pacing 40, has had some 2:1 block low 40's  Relevant CV Studies:  03/08/23:LHC   Ost LAD lesion is 80% stenosed.   Prox LAD to Mid LAD lesion is 90% stenosed.   Mid LAD lesion is 95% stenosed.   Mid Cx to Dist Cx lesion is 95% stenosed.   Mid RCA lesion is 85% stenosed.   Prox Graft lesion is 20% stenosed.   Prox Cx to Mid Cx lesion is 100% stenosed.   Mid LM to Dist LM lesion is 50% stenosed.   LV end diastolic pressure is mildly elevated.   The left ventricular ejection fraction is 25-35% by visual estimate.   1.  Severe underlying three-vessel coronary artery disease with patent grafts including LIMA to LAD, SVG to OM 3 and SVG to right PDA. 2.  Moderately to severely reduced LV systolic function with an EF of 30% and mildly elevated left ventricular end-diastolic pressure at 20 mmHg.  06/19/23: TTE 1. Left ventricular ejection fraction, by estimation, is 35 to 40%. The  left ventricle has moderately decreased function. The left ventricle  demonstrates regional wall motion abnormalities with basal to mid  inferolateral akinesis and severe inferior  hypokinesis. There is moderate  concentric left ventricular hypertrophy.  Left ventricular diastolic parameters are consistent with Grade II  diastolic dysfunction (pseudonormalization).   2. Right ventricular systolic function is mildly reduced. The right  ventricular size is normal. There is normal pulmonary artery systolic  pressure. The estimated right ventricular systolic pressure is 23.5 mmHg.   3. Left atrial size was mildly dilated.   4. Right atrial size was mildly dilated.   5. The mitral valve is degenerative. Trivial mitral valve regurgitation.  Moderate mitral annular calcification.   6. The aortic valve is tricuspid. There is severe calcifcation of  the  aortic valve. Aortic valve regurgitation is not visualized. Probably  moderate low flow/low gradient aortic valve stenosis. Mean gradient 9 mmHg  but AVA by continuity equation is about   1 cm^2. Visually, at least moderate AS.   7. The inferior vena cava is normal in size with <50% respiratory  variability, suggesting right atrial pressure of 8 mmHg.    Laboratory Data:  High Sensitivity Troponin:  No results for input(s): "TROPONINIHS" in the last 720 hours.   Chemistry Recent Labs  Lab 12/22/23 1150  NA 134*  K 4.1  CL 100  CO2 21*  GLUCOSE 134*  BUN 15  CREATININE 1.17  CALCIUM  10.3  GFRNONAA >60  ANIONGAP 13    Recent Labs  Lab 12/15/23 1415  PROT 6.1  ALBUMIN  4.6  AST 26  ALT 18  ALKPHOS 103  BILITOT 0.5   Lipids  Recent Labs  Lab 12/15/23 1415  CHOL 120  TRIG 124  HDL 63  LABVLDL 22  LDLCALC 35  CHOLHDL 1.9    Hematology Recent Labs  Lab 12/22/23 1150  WBC 8.9  RBC 5.34  HGB 15.4  HCT 46.4  MCV 86.9  MCH 28.8  MCHC 33.2  RDW 13.8  PLT 121*   Thyroid No results for input(s): "TSH", "FREET4" in the last 168 hours.  BNP Recent Labs  Lab 12/22/23 1150  BNP 1,209.2*    DDimer No results for input(s): "DDIMER" in the last 168 hours.   Radiology/Studies:  DG Chest 2 View Result Date:  12/22/2023 CLINICAL DATA:  Bradycardia and shortness of breath EXAM: CHEST - 2 VIEW COMPARISON:  Chest radiograph dated 08/02/2022 FINDINGS: Lines/tubes: Left chest wall pacemaker lead projects over the right ventricle. Lungs: Well inflated lungs. No focal consolidation. Pleura: No pneumothorax or pleural effusion. Heart/mediastinum: Similar mildly enlarged cardiomediastinal silhouette. Bones: Median sternotomy wires are nondisplaced. Chronic appearance of the right clavicle. IMPRESSION: 1. No active cardiopulmonary disease. 2. Similar mild cardiomegaly. Electronically Signed   By: Limin  Xu M.D.   On: 12/22/2023 11:39       Assessment and Plan:   CHB V paced  40 Baseline trifascicular block Low dose coreg   though has not taken in 3 days > will stop BP stable  will need upgrade to CRT though timeline would be late week next week Increased his pacing rate to 60 Pt and wife uneasy to home home Will admitfor observation anticipating discharge tomorrow if he is feeling well and get him scheduled for new week  2. CAD recent cath with patent grafts No CP  3. ICM 4. Chronic CHF Optivol suggests volume  Elevated BNP CXR and exam do not support volume  5. VT None on device interrogation  Risk Assessment/Risk Scores:    For questions or updates, please contact [facility] Please consult [email or web address] for contact info under    Signed, Debbie Fails, PA-C  12/22/2023 12:54 PM

## 2023-12-22 NOTE — Telephone Encounter (Signed)
 Spoke with patient. Pt states he has not improved since holding coreg .Pt also mentioned he has been constipated for the past few days. When asking if that started around the same time as his symptoms he stated that it did. Pts BP while on the phone 140/68 and HR 49. Pt states he still has tingling in his legs and some dizziness when standing. He took a colace yesterday and has not had a bowel movement. Was also given prednisone  recently for allergies but has not taken anything. Advised pt to call PCP for bowel issues and that it may be related.  Told him I would send to Dr Elodia Hailstone for review. He asked about his device and if it showed anything happening and I told him I would send to device clinic to check on their end as well. 911/ed precautions given. Pt stated understanding.

## 2023-12-22 NOTE — ED Notes (Signed)
 Patient transported to X-ray

## 2023-12-22 NOTE — Telephone Encounter (Signed)
 Patient calling back to say things are still the same for him in how he is feeling. Wants to see what else can be done. Please advise

## 2023-12-22 NOTE — ED Triage Notes (Addendum)
 Pt. Stated, I was seen last Friday and everything ok. From last Monday my heart rate has been in the 40's. When I get up I feel like somebody has kicked me.  Pt has a defibrillator.

## 2023-12-23 DIAGNOSIS — I442 Atrioventricular block, complete: Secondary | ICD-10-CM | POA: Diagnosis not present

## 2023-12-23 MED ORDER — AZELASTINE HCL 0.1 % NA SOLN: 2.0000 | Freq: Every day | NASAL | Status: DC | PRN

## 2023-12-23 MED ORDER — NITROGLYCERIN 0.4 MG SL SUBL: 0.4000 mg | SUBLINGUAL_TABLET | SUBLINGUAL | 12 refills | Status: AC | PRN

## 2023-12-23 NOTE — Discharge Summary (Addendum)
 Discharge Summary    Patient ID: Cameron Daniel MRN: [id number]; DOB: Jan 05, 1946  Admit date: 12/22/2023 Discharge date: 12/23/2023  PCP:  Austine Lefort, MD   [facility] Providers Cardiologist:  Oneil Bigness, MD  Electrophysiologist:  Boyce Byes, MD  Advanced Heart Failure:  Jules Oar, MD       Discharge Diagnoses    Principal Problem:   Complete heart block [facility]) Active Problems:   HFrEF (heart failure with reduced ejection fraction) (HCC)   Ventricular tachycardia (HCC)   Diagnostic Studies/Procedures    None _____________   History of Present Illness     Cameron Daniel is a 78 y.o. male with CAD status post CABG in 2020, HFrEF, ischemic cardiomyopathy, VT s/p ICD, diabetes, hyperlipidemia, hypertension.  He was seen by Dr. Marven Slimmer earlier this month in clinic.  Device interrogation demonstrated slow, nonsustained VT.  Device parameters were adjusted.  He presented to the hospital on the date of admission with weakness, fatigue, shortness of breath and heart rates in the 40s.  Electrocardiogram demonstrated complete heart block with pacing at 40 bpm.  Telemetry demonstrated complete heart block with intermittent 2:1 AV block.  Hospital Course     Consultants:    He was admitted for observation.  His device was reprogrammed to a base rate of 60 bpm.  His carvedilol  was stopped.  Ventricular tachycardia was quiescent.  It was felt that if he was feeling better he could be discharged today with plans to return later this week for CRT upgrade.  The patient was seen this morning by Dr. Lawana Pray.  He had no events overnight and is feeling better today.  Therefore, he is felt to be stable for discharge to home.  We [name] ensure that the office contact him this week to go over instructions for CRT upgrade.  This is planned for 12/28/2023 with Dr. Marven Slimmer.     Did the patient have an acute coronary syndrome (MI, NSTEMI, STEMI, etc) this  admission?:  No                               Did the patient have a percutaneous coronary intervention (stent / angioplasty)?:  No.       _____________  Discharge Vitals Blood pressure 112/69, pulse 60, temperature 97.7 F (36.5 C), temperature source Oral, resp. rate 18, height 5\' 10"  (1.778 m), weight 85.9 kg, SpO2 96%.  Filed Weights   12/22/23 1714  Weight: 85.9 kg    Labs & Radiologic Studies    CBC Recent Labs    12/22/23 1150 12/22/23 1504  WBC 8.9 7.4  HGB 15.4 14.7  HCT 46.4 43.9  MCV 86.9 87.1  PLT 121* 122*   Basic Metabolic Panel Recent Labs    40/98/11 1150 12/22/23 1504  NA 134*  --   K 4.1  --   CL 100  --   CO2 21*  --   GLUCOSE 134*  --   BUN 15  --   CREATININE 1.17 1.11  CALCIUM  10.3  --     BNP Recent Labs    12/22/23 1150  BNP 1,209.2*    _____________  DG Chest 2 View Result Date: 12/22/2023  CLINICAL DATA:  Bradycardia and shortness of breath EXAM: CHEST - 2 VIEW COMPARISON:  Chest radiograph dated 08/02/2022  FINDINGS: Lines/tubes: Left chest wall pacemaker lead projects over the right ventricle. Lungs: Well inflated  lungs. No focal consolidation. Pleura: No pneumothorax or pleural effusion. Heart/mediastinum: Similar mildly enlarged cardiomediastinal silhouette. Bones: Median sternotomy wires are nondisplaced. Chronic appearance of the right clavicle.  IMPRESSION: 1. No active cardiopulmonary disease. 2. Similar mild cardiomegaly.  Electronically Signed   By: Limin  Xu M.D.   On: 12/22/2023 11:39   CUP PACEART REMOTE DEVICE CHECK Result Date: 12/21/2023 ICD scheduled remote reviewed. Normal device function.  Presenting rhythm: VP/VS No new events since in office check on 11/29/23. Next remote 91 days. AB, CVRS    Disposition   Pt is being discharged home today in good condition.  Follow-up Plans & Appointments     Follow-up Information     [facility] Follow up.   Why: Return on 12/28/23 for your pacemaker  procedure with Dr. Marven Slimmer. Our office [name] call you with instructions. Contact information: [address] [place]  [address] [phone]               Discharge Instructions     Diet - low sodium heart healthy   Complete by: As directed    Diet Carb Modified   Complete by: As directed    Increase activity slowly   Complete by: As directed         Discharge Medications   Allergies as of 12/23/2023       Reactions   Doxazosin Mesylate Other (See Comments)   Drops in blood pressure    Losartan     Other reaction(s): Muscle pain   Testosterone     Other reaction(s): Erythrocytosis Other reaction(s): Erythrocytosis   Flomax  [tamsulosin  Hcl] Itching, Other (See Comments)   Drops in blood pressure    Penicillins Rash, Other (See Comments)   Has patient had a PCN reaction causing immediate rash, facial/tongue/throat swelling, SOB or lightheadedness with hypotension: No Has patient had a PCN reaction causing severe rash involving mucus membranes or skin necrosis: No Has patient had a PCN reaction that required hospitalization No Has patient had a PCN reaction occurring within the last 10 years: No If all of the above answers are "NO", then may proceed with Cephalosporin use.   Sulfonamide Derivatives Rash   Trulicity  [dulaglutide ] Nausea And Vomiting   N&V, Dizziness        Medication List     STOP taking these medications    carvedilol  3.125 MG tablet Commonly known as: COREG    metroNIDAZOLE  250 MG tablet Commonly known as: FLAGYL    neomycin-polymyxin b -dexamethasone  3.5-10000-0.1 Oint Commonly known as: MAXITROL   predniSONE  10 MG (21) Tbpk tablet Commonly known as: STERAPRED UNI-PAK 21 TAB   predniSONE  20 MG tablet Commonly known as: DELTASONE        TAKE these medications    acetaminophen  500 MG tablet Commonly known as: TYLENOL  Take 1,000 mg by mouth at bedtime as needed for mild pain.   aspirin  EC 81 MG  tablet Take 1 tablet (81 mg total) by mouth daily.   azelastine  0.1 % nasal spray Commonly known as: ASTELIN  Place 2 sprays into both nostrils daily as needed for allergies or rhinitis. Use in each nostril as directed What changed:  when to take this reasons to take this Another medication with the same name was removed. Continue taking this medication, and follow the directions you see here.   cholestyramine  4 g packet Commonly known as: Questran  Take 0.5 packets (2 g total) by mouth 2 (two) times daily.   citalopram  10 MG tablet Commonly known as: CELEXA  Take 1  tablet (10 mg total) by mouth daily.   clotrimazole  1 % cream Commonly known as: LOTRIMIN  Apply 1 Application topically in the morning, at noon, and at bedtime.   diphenoxylate -atropine  2.5-0.025 MG tablet Commonly known as: Lomotil  Take 2 tablets by mouth 4 (four) times daily as needed for diarrhea or loose stools.   empagliflozin  25 MG Tabs tablet Commonly known as: Jardiance  TAKE 25 MG (1 TABLET) BY MOUTH DAILY BEFORE BREAKFAST. STOP PIOGLITAZONE    fexofenadine  180 MG tablet Commonly known as: ALLEGRA  Take 1 tablet (180 mg total) by mouth daily as needed. For allergies What changed: when to take this   finasteride  5 MG tablet Commonly known as: PROSCAR  TAKE 1 TABLET BY MOUTH EVERY DAY   hydrocortisone  2.5 % rectal cream Commonly known as: ANUSOL -HC Place 1 Application rectally daily as needed for hemorrhoids or anal itching.   MAGNESIUM  PO Take 1 tablet by mouth daily.   mirtazapine  7.5 MG tablet Commonly known as: REMERON  Take 1 tablet (7.5 mg total) by mouth at bedtime.   montelukast  10 MG tablet Commonly known as: SINGULAIR  TAKE 1 TABLET BY MOUTH EVERYDAY AT BEDTIME   nitroGLYCERIN  0.4 MG SL tablet Commonly known as: NITROSTAT  Place 1 tablet (0.4 mg total) under the tongue every 5 (five) minutes x 3 doses as needed for chest pain.   ondansetron  4 MG tablet Commonly known as: Zofran  Take 1  tablet (4 mg total) by mouth every 8 (eight) hours as needed for nausea or vomiting.   pantoprazole  40 MG tablet Commonly known as: PROTONIX  TAKE 1 TABLET BY MOUTH TWICE A DAY   Pataday  0.1 % ophthalmic solution Generic drug: olopatadine  Place 1 drop into both eyes daily as needed for allergies.   rosuvastatin  40 MG tablet Commonly known as: CRESTOR  TAKE 1 TABLET BY MOUTH DAILY AT 6 PM.   sitaGLIPtin  100 MG tablet Commonly known as: JANUVIA  Take 100 mg by mouth daily.   spironolactone  25 MG tablet Commonly known as: Aldactone  Take 0.5 tablets (12.5 mg total) by mouth daily.   vitamin B-12 100 MCG tablet Commonly known as: CYANOCOBALAMIN  Take 100 mcg by mouth daily.   VITAMIN D -3 PO Take 1 tablet by mouth daily.   Zerviate  0.24 % Soln Generic drug: Cetirizine  HCl Apply 1 drop to eye 2 (two) times daily.           Outstanding Labs/Studies   Duration of Discharge Encounter: APP Time: 22 minutes   Signed, Marlyse Single, PA-C 12/23/2023, 11:29 AM    I have seen and examined this patient with Marlyse Single.  Agree with above, note added to reflect my findings.  Patient admitted to the hospital with complete heart block.  He was being ventricular paced at 40 bpm based on his single-chamber ICD.  Pacing rate was changed to 60.  He feels much improved with less fatigue and shortness of breath.  Kellsie Grindle plan for discharge today with CRT upgrade on Thursday.  GEN: No acute distress.   Neck: No JVD Cardiac: RRR, no murmurs, rubs, or gallops.  Respiratory: normal BS bases bilaterally. GI: Soft, nontender, non-distended  MS: No edema; No deformity. Neuro:  Nonfocal  Skin: warm and dry, device site well healed Psych: Normal affect   MD discharge time: 25 minutes  Legend Tumminello M. Yitzchak Kothari MD 12/23/2023 11:44 AM

## 2023-12-23 NOTE — Progress Notes (Signed)
   [name] Date of Encounter: 12/23/2023 [facility] Cardiologist: None   Interval Summary  .    Feeling well without acute complaint.  Improved from yesterday.  Vital Signs .    Vitals:   12/22/23 2010 12/23/23 0015 12/23/23 0413 12/23/23 0745  BP: 101/66 (!) 104/56 120/67 112/69  Pulse: 60 (!) 59 (!) 59 60  Resp: 18 16 18    Temp: 97.8 F (36.6 C) 97.6 F (36.4 C) 97.7 F (36.5 C) 97.7 F (36.5 C)  TempSrc: Oral Oral Oral Oral  SpO2: 96% 95% 96%   Weight:      Height:        Intake/Output Summary (Last 24 hours) at 12/23/2023 0927 Last data filed at 12/23/2023 0414 Gross per 24 hour  Intake 480 ml  Output 600 ml  Net -120 ml      12/22/2023    5:14 PM 12/18/2023    2:19 PM 12/15/2023   12:56 PM  Last 3 Weights  Weight (lbs) 189 lb 4.8 oz 189 lb 4 oz 190 lb  Weight (kg) 85.866 kg 85.843 kg 86.183 kg      Telemetry/ECG    Ventricular paced with PVCs- Personally Reviewed  Physical Exam .   GEN: No acute distress.   Neck: No JVD Cardiac: RRR, no murmurs, rubs, or gallops.  Respiratory: Clear to auscultation bilaterally. GI: Soft, nontender, non-distended  MS: No edema  Assessment & Plan .     1.  Complete heart block: Patient's ICD rate was increased to 60.  He is feeling improved.  He has plans for CRT upgrade on Thursday.  Cameron Daniel plan for discharge today.  2.  Ventricular tachycardia: No further episodes  3.  Coronary artery disease: No acute ischemic symptoms  4.  Chronic systolic heart failure: No obvious volume overload on exam For questions or updates, please contact [facility] Please consult [email or web address] for contact info under        Signed, Zohar Laing Cortland Ding, MD

## 2023-12-23 NOTE — Plan of Care (Signed)
 Pt is discharged to home. All personal belongings returned. PIV removed. Wife at bedside at discharge. Instruction provided.    Problem: Education: Goal: Knowledge of General Education information will improve Description: Including pain rating scale, medication(s)/side effects and non-pharmacologic comfort measures Outcome: Completed/Met   Problem: Health Behavior/Discharge Planning: Goal: Ability to manage health-related needs will improve Outcome: Completed/Met   Problem: Clinical Measurements: Goal: Ability to maintain clinical measurements within normal limits will improve Outcome: Completed/Met Goal: Will remain free from infection Outcome: Completed/Met Goal: Diagnostic test results will improve Outcome: Completed/Met Goal: Respiratory complications will improve Outcome: Completed/Met Goal: Cardiovascular complication will be avoided Outcome: Completed/Met   Problem: Activity: Goal: Risk for activity intolerance will decrease Outcome: Completed/Met   Problem: Nutrition: Goal: Adequate nutrition will be maintained Outcome: Completed/Met   Problem: Coping: Goal: Level of anxiety will decrease Outcome: Completed/Met   Problem: Elimination: Goal: Will not experience complications related to bowel motility Outcome: Completed/Met Goal: Will not experience complications related to urinary retention Outcome: Completed/Met   Problem: Pain Managment: Goal: General experience of comfort will improve and/or be controlled Outcome: Completed/Met   Problem: Safety: Goal: Ability to remain free from injury will improve Outcome: Completed/Met   Problem: Skin Integrity: Goal: Risk for impaired skin integrity will decrease Outcome: Completed/Met

## 2023-12-25 ENCOUNTER — Telehealth: Payer: Self-pay

## 2023-12-25 NOTE — Telephone Encounter (Signed)
 Called pt and went over detailed instructions for CRT-D upgrade with Dr. Marven Slimmer on 5/1 at 1:30 pm. He was seen in the ED this past weekend and had labs done at that time.   He does not use MyChart so I went over meds - He will hold Jardiance  now until after procedure. Hold all medications the AM of procedure. NPO after midnight the night before.   He is aware to arrive at 11:30 am at [facility] (Entrance A)

## 2023-12-26 ENCOUNTER — Other Ambulatory Visit: Payer: Self-pay

## 2023-12-27 NOTE — Pre-Procedure Instructions (Signed)
Instructed patient on the following items: Arrival time 1000 Nothing to eat or drink after midnight No meds AM of procedure Responsible person to drive you home and stay with you for 24 hrs Wash with special soap night before and morning of procedure  

## 2023-12-27 NOTE — Telephone Encounter (Signed)
 Patient scheduled for  ICD change out procedure tomorrow 5/1

## 2023-12-28 ENCOUNTER — Ambulatory Visit (HOSPITAL_COMMUNITY)
Admission: RE | Admit: 2023-12-28 | Discharge: 2023-12-29 | Disposition: A | Discharge status: Home / Self Care | Attending: Cardiology | Admitting: Cardiology

## 2023-12-28 ENCOUNTER — Other Ambulatory Visit: Payer: Self-pay

## 2023-12-28 ENCOUNTER — Encounter: Payer: Self-pay | Admitting: Emergency Medicine

## 2023-12-28 ENCOUNTER — Encounter (HOSPITAL_COMMUNITY)
Admission: RE | Disposition: A | Discharge status: Home / Self Care | Payer: Self-pay | Source: Home / Self Care | Attending: Cardiology

## 2023-12-28 DIAGNOSIS — I471 Supraventricular tachycardia, unspecified: Secondary | ICD-10-CM | POA: Diagnosis not present

## 2023-12-28 DIAGNOSIS — I255 Ischemic cardiomyopathy: Secondary | ICD-10-CM | POA: Insufficient documentation

## 2023-12-28 DIAGNOSIS — I472 Ventricular tachycardia, unspecified: Secondary | ICD-10-CM | POA: Diagnosis not present

## 2023-12-28 DIAGNOSIS — I5022 Chronic systolic (congestive) heart failure: Secondary | ICD-10-CM | POA: Diagnosis not present

## 2023-12-28 DIAGNOSIS — I442 Atrioventricular block, complete: Secondary | ICD-10-CM | POA: Diagnosis not present

## 2023-12-28 DIAGNOSIS — Z79899 Other long term (current) drug therapy: Secondary | ICD-10-CM | POA: Diagnosis not present

## 2023-12-28 DIAGNOSIS — E119 Type 2 diabetes mellitus without complications: Secondary | ICD-10-CM | POA: Diagnosis not present

## 2023-12-28 DIAGNOSIS — Z4502 Encounter for adjustment and management of automatic implantable cardiac defibrillator: Secondary | ICD-10-CM | POA: Diagnosis present

## 2023-12-28 DIAGNOSIS — I110 Hypertensive heart disease with heart failure: Secondary | ICD-10-CM | POA: Insufficient documentation

## 2023-12-28 DIAGNOSIS — I5042 Chronic combined systolic (congestive) and diastolic (congestive) heart failure: Secondary | ICD-10-CM | POA: Diagnosis not present

## 2023-12-28 DIAGNOSIS — I2510 Atherosclerotic heart disease of native coronary artery without angina pectoris: Secondary | ICD-10-CM | POA: Diagnosis not present

## 2023-12-28 DIAGNOSIS — Z7982 Long term (current) use of aspirin: Secondary | ICD-10-CM | POA: Insufficient documentation

## 2023-12-28 DIAGNOSIS — Z7984 Long term (current) use of oral hypoglycemic drugs: Secondary | ICD-10-CM | POA: Insufficient documentation

## 2023-12-28 DIAGNOSIS — Z951 Presence of aortocoronary bypass graft: Secondary | ICD-10-CM | POA: Insufficient documentation

## 2023-12-28 DIAGNOSIS — Z95810 Presence of automatic (implantable) cardiac defibrillator: Secondary | ICD-10-CM | POA: Diagnosis present

## 2023-12-28 HISTORY — PX: LEAD INSERTION: EP1212

## 2023-12-28 HISTORY — PX: ICD GENERATOR CHANGEOUT: EP1231

## 2023-12-28 LAB — GLUCOSE, CAPILLARY
Glucose-Capillary: 143 mg/dL — ABNORMAL HIGH (ref 70–99)
Glucose-Capillary: 118 mg/dL — ABNORMAL HIGH (ref 70–99)
Glucose-Capillary: 109 mg/dL — ABNORMAL HIGH (ref 70–99)

## 2023-12-28 MED ORDER — MIRTAZAPINE 7.5 MG PO TABS
7.5000 mg | ORAL_TABLET | Freq: Every day | ORAL | Status: DC
Start: 2023-12-28 — End: 2023-12-29
  Filled 2023-12-28 (×2): qty 1

## 2023-12-28 MED ORDER — ACETAMINOPHEN 325 MG PO TABS
325.0000 mg | ORAL_TABLET | ORAL | Status: DC | PRN
  Administered 2023-12-28 – 2023-12-29 (×2): 650 mg via ORAL
  Filled 2023-12-28 (×2): qty 2

## 2023-12-28 MED ORDER — CHLORHEXIDINE GLUCONATE 4 % EX SOLN
4.0000 | Freq: Once | CUTANEOUS | Status: AC
  Administered 2023-12-28: 4 via TOPICAL
  Filled 2023-12-28: qty 60

## 2023-12-28 MED ORDER — IOHEXOL 350 MG/ML SOLN
INTRAVENOUS | Status: DC | PRN
  Administered 2023-12-28: 15 mL
  Administered 2023-12-28: 10 mL

## 2023-12-28 MED ORDER — CEFAZOLIN SODIUM-DEXTROSE 2-3 GM-%(50ML) IV SOLR: INTRAVENOUS | Status: DC | PRN

## 2023-12-28 MED ORDER — SPIRONOLACTONE 12.5 MG HALF TABLET
12.5000 mg | ORAL_TABLET | Freq: Every day | ORAL | Status: DC
Start: 2023-12-29 — End: 2023-12-29
  Administered 2023-12-29: 12.5 mg via ORAL
  Filled 2023-12-28: qty 1

## 2023-12-28 MED ORDER — MIDAZOLAM HCL 2 MG/2ML IJ SOLN
INTRAMUSCULAR | Status: AC
  Filled 2023-12-28: qty 2

## 2023-12-28 MED ORDER — SODIUM CHLORIDE 0.9 % IV SOLN
80.0000 mg | INTRAVENOUS | Status: AC
  Administered 2023-12-28: 80 mg

## 2023-12-28 MED ORDER — LIDOCAINE HCL 1 % IJ SOLN
INTRAMUSCULAR | Status: AC
  Filled 2023-12-28: qty 60

## 2023-12-28 MED ORDER — ROSUVASTATIN CALCIUM 20 MG PO TABS
40.0000 mg | ORAL_TABLET | Freq: Every day | ORAL | Status: DC
  Administered 2023-12-28 – 2023-12-29 (×2): 40 mg via ORAL
  Filled 2023-12-28 (×2): qty 2

## 2023-12-28 MED ORDER — ONDANSETRON HCL 4 MG/2ML IJ SOLN: 4.0000 mg | Freq: Four times a day (QID) | INTRAMUSCULAR | Status: DC | PRN

## 2023-12-28 MED ORDER — EMPAGLIFLOZIN 25 MG PO TABS
25.0000 mg | ORAL_TABLET | Freq: Every day | ORAL | Status: DC
  Administered 2023-12-29: 25 mg via ORAL
  Filled 2023-12-28: qty 1

## 2023-12-28 MED ORDER — MIDAZOLAM HCL 5 MG/5ML IJ SOLN
INTRAMUSCULAR | Status: DC | PRN
Start: 2023-12-28 — End: 2023-12-28
  Administered 2023-12-28: .5 mg via INTRAVENOUS
  Administered 2023-12-28: 1 mg via INTRAVENOUS

## 2023-12-28 MED ORDER — SODIUM CHLORIDE 0.9 % IV SOLN
INTRAVENOUS | Status: AC
  Filled 2023-12-28: qty 2

## 2023-12-28 MED ORDER — FENTANYL CITRATE (PF) 100 MCG/2ML IJ SOLN
INTRAMUSCULAR | Status: DC | PRN
  Administered 2023-12-28: 12.5 ug via INTRAVENOUS
  Administered 2023-12-28: 25 ug via INTRAVENOUS

## 2023-12-28 MED ORDER — FENTANYL CITRATE (PF) 100 MCG/2ML IJ SOLN
INTRAMUSCULAR | Status: AC
Start: 2023-12-28 — End: ?
  Filled 2023-12-28: qty 2

## 2023-12-28 MED ORDER — LINAGLIPTIN 5 MG PO TABS
5.0000 mg | ORAL_TABLET | Freq: Every day | ORAL | Status: DC
  Administered 2023-12-29: 5 mg via ORAL
  Filled 2023-12-28: qty 1

## 2023-12-28 MED ORDER — HEPARIN (PORCINE) IN NACL 1000-0.9 UT/500ML-% IV SOLN
INTRAVENOUS | Status: DC | PRN
Start: 2023-12-28 — End: 2023-12-28
  Administered 2023-12-28: 500 mL

## 2023-12-28 MED ORDER — CITALOPRAM HYDROBROMIDE 10 MG PO TABS
10.0000 mg | ORAL_TABLET | Freq: Every day | ORAL | Status: DC
  Administered 2023-12-29: 10 mg via ORAL
  Filled 2023-12-28: qty 1

## 2023-12-28 MED ORDER — CEFAZOLIN SODIUM-DEXTROSE 2-4 GM/100ML-% IV SOLN
INTRAVENOUS | Status: AC
  Filled 2023-12-28: qty 100

## 2023-12-28 MED ORDER — CEFAZOLIN SODIUM-DEXTROSE 2-4 GM/100ML-% IV SOLN
2.0000 g | INTRAVENOUS | Status: AC
  Administered 2023-12-28: 2 g via INTRAVENOUS

## 2023-12-28 MED ORDER — FINASTERIDE 5 MG PO TABS
5.0000 mg | ORAL_TABLET | Freq: Every day | ORAL | Status: DC
  Administered 2023-12-29: 5 mg via ORAL
  Filled 2023-12-28: qty 1

## 2023-12-28 MED ORDER — LIDOCAINE HCL (PF) 1 % IJ SOLN
INTRAMUSCULAR | Status: DC | PRN
  Administered 2023-12-28: 60 mL

## 2023-12-28 MED ORDER — SODIUM CHLORIDE 0.9 % IV SOLN: INTRAVENOUS | Status: DC

## 2023-12-28 MED ORDER — POVIDONE-IODINE 10 % EX SWAB
2.0000 | Freq: Once | CUTANEOUS | Status: AC
  Administered 2023-12-28: 2 via TOPICAL

## 2023-12-28 MED ORDER — PANTOPRAZOLE SODIUM 40 MG PO TBEC
40.0000 mg | DELAYED_RELEASE_TABLET | Freq: Two times a day (BID) | ORAL | Status: DC
  Administered 2023-12-28 – 2023-12-29 (×2): 40 mg via ORAL
  Filled 2023-12-28 (×2): qty 1

## 2023-12-28 NOTE — Interval H&P Note (Signed)
 History and Physical Interval Note:  12/28/2023 1:00 PM  Cameron Daniel  has presented today for surgery, with the diagnosis of heart block.  The various methods of treatment have been discussed with the patient and family. After consideration of risks, benefits and other options for treatment, the patient has consented to  Procedure(s): ICD GENERATOR CHANGEOUT (N/A) LEAD INSERTION (N/A) as a surgical intervention.  The patient's history has been reviewed, patient examined, no change in status, stable for surgery.  I have reviewed the patient's chart and labs.  Questions were answered to the patient's satisfaction.     Cameron Daniel is a 78yo man who I saw recently in the hospital for an evaluation of bradycardia at the request of Dr Liam Redhead. He has an extensive past medical history including CAD s/p CABG, ICM, HTN, DM, SVT, VT s/p prior ablation with an ICD in situ. He reports several days of lower HR and associated dizziness/fatigue. In the ER he was in CHB which ws new. He presents today for CRT upgrade.   GEN: No acute distress.  Elderly. Cardiac: regular rhythm, bradycardic, no murmurs, rubs, or gallops. CIED pocket well healed. Psych: Normal affect    Tele shows V pacing, CHB. Intermittent 2:1 AV block.   Device interrogation personally reviewed. V pacing has recently increased significantly.  Device reprogrammed to a base rate of 60 bpm.   ECGs reviewed. Patient with baseline trifascicular block.   #CHB New. Symptomatic. Plan for CRT-D upgrade today. Risks, benefits, alternatives to CRT-D upgrade were discussed in detail with the patient today. The patient understands that the risks include but are not limited to bleeding, infection, pneumothorax, perforation, tamponade, vascular damage, renal failure, MI, stroke, death, and lead dislodgement and wishes to proceed.  We will therefore schedule device implantation at the next available time.   #VT Quiescent.   #CAD No ischemic  symptoms today.    Satoria Dunlop T Skylyn Slezak

## 2023-12-29 ENCOUNTER — Ambulatory Visit (HOSPITAL_COMMUNITY)

## 2023-12-29 ENCOUNTER — Encounter (HOSPITAL_COMMUNITY): Payer: Self-pay | Admitting: Cardiology

## 2023-12-29 ENCOUNTER — Other Ambulatory Visit (HOSPITAL_COMMUNITY): Payer: Self-pay

## 2023-12-29 DIAGNOSIS — I472 Ventricular tachycardia, unspecified: Secondary | ICD-10-CM | POA: Diagnosis not present

## 2023-12-29 DIAGNOSIS — I110 Hypertensive heart disease with heart failure: Secondary | ICD-10-CM | POA: Diagnosis not present

## 2023-12-29 DIAGNOSIS — Z95810 Presence of automatic (implantable) cardiac defibrillator: Secondary | ICD-10-CM

## 2023-12-29 DIAGNOSIS — I471 Supraventricular tachycardia, unspecified: Secondary | ICD-10-CM | POA: Diagnosis not present

## 2023-12-29 DIAGNOSIS — I2510 Atherosclerotic heart disease of native coronary artery without angina pectoris: Secondary | ICD-10-CM | POA: Diagnosis not present

## 2023-12-29 DIAGNOSIS — I442 Atrioventricular block, complete: Secondary | ICD-10-CM | POA: Diagnosis not present

## 2023-12-29 DIAGNOSIS — Z7984 Long term (current) use of oral hypoglycemic drugs: Secondary | ICD-10-CM | POA: Diagnosis not present

## 2023-12-29 DIAGNOSIS — E119 Type 2 diabetes mellitus without complications: Secondary | ICD-10-CM | POA: Diagnosis not present

## 2023-12-29 DIAGNOSIS — I255 Ischemic cardiomyopathy: Secondary | ICD-10-CM | POA: Diagnosis not present

## 2023-12-29 DIAGNOSIS — Z4502 Encounter for adjustment and management of automatic implantable cardiac defibrillator: Secondary | ICD-10-CM | POA: Diagnosis not present

## 2023-12-29 DIAGNOSIS — R0989 Other specified symptoms and signs involving the circulatory and respiratory systems: Secondary | ICD-10-CM | POA: Diagnosis not present

## 2023-12-29 DIAGNOSIS — Z79899 Other long term (current) drug therapy: Secondary | ICD-10-CM | POA: Diagnosis not present

## 2023-12-29 DIAGNOSIS — I5042 Chronic combined systolic (congestive) and diastolic (congestive) heart failure: Secondary | ICD-10-CM | POA: Diagnosis not present

## 2023-12-29 DIAGNOSIS — Z951 Presence of aortocoronary bypass graft: Secondary | ICD-10-CM | POA: Diagnosis not present

## 2023-12-29 MED ORDER — METOPROLOL SUCCINATE ER 25 MG PO TB24
25.0000 mg | ORAL_TABLET | Freq: Every day | ORAL | 11 refills | Status: DC
Start: 2023-12-29 — End: 2024-04-01
  Filled 2023-12-29: qty 30, 30d supply, fill #0

## 2023-12-29 MED FILL — Midazolam HCl Inj 2 MG/2ML (Base Equivalent): INTRAMUSCULAR | Qty: 1.5 | Status: AC

## 2023-12-29 NOTE — Plan of Care (Signed)
   Problem: Education: Goal: Knowledge of cardiac device and self-care will improve Outcome: Progressing Goal: Ability to safely manage health related needs after discharge will improve Outcome: Progressing Goal: Individualized Educational Video(s) Outcome: Progressing   Problem: Cardiac: Goal: Ability to achieve and maintain adequate cardiopulmonary perfusion will improve Outcome: Progressing

## 2023-12-29 NOTE — Discharge Summary (Signed)
 ELECTROPHYSIOLOGY PROCEDURE DISCHARGE SUMMARY    Patient ID: Cameron Daniel,  MRN: [id number], DOB/AGE: [birth year] 78 y.o.  Admit date: 12/28/2023 Discharge date: 12/29/2023  Primary Care Physician: Austine Lefort, MD  Primary Cardiologist: Oneil Bigness, MD  Electrophysiologist: Dr. Marven Slimmer    Primary Diagnosis:  Chronic systolic CHF Complete Heart Block  Allergies  Allergen Reactions   Doxazosin Mesylate Other (See Comments)    Drops in blood pressure    Losartan      Other reaction(s): Muscle pain   Testosterone      Other reaction(s): Erythrocytosis Other reaction(s): Erythrocytosis   Flomax  [Tamsulosin  Hcl] Itching and Other (See Comments)    Drops in blood pressure     Penicillins Rash and Other (See Comments)    Has patient had a PCN reaction causing immediate rash, facial/tongue/throat swelling, SOB or lightheadedness with hypotension: No Has patient had a PCN reaction causing severe rash involving mucus membranes or skin necrosis: No Has patient had a PCN reaction that required hospitalization No Has patient had a PCN reaction occurring within the last 10 years: No If all of the above answers are "NO", then may proceed with Cephalosporin use.    Sulfonamide Derivatives Rash   Trulicity  [Dulaglutide ] Nausea And Vomiting    N&V, Dizziness     Procedures This Admission:  1.  Upgrade of a Medtronic Single Chamber ICD to a BiV ICD on 12/29/23 by Dr. Marven Slimmer.  The patient received a Medtronic Cobalt XT Quad CRT [id number] with Medtronic CapSureFix Novus [id number] right atrial lead and a Medtronic Attain Perform S MRI Surescan [id number] CS lead, in conjunction with a pre-existing RV lead. DFTs were deferred at time of implant There were no post procedure complications 2.  CXR on 12/29/23 demonstrated no pneumothorax status post device implantation.      Brief HPI: Cameron Daniel is a 78 y.o. male was followed by EP in the outpatient setting for management of his  ICD and h/o VT.  Pt had developed CHB requiring pacing from his single chamber ICD. I this setting, risks, benefits, and alternatives to ICD upgrade were reviewed with the patient who wished to proceed.   Hospital Course:  The patient was admitted and underwent upgrade to a Medtronic BiV ICD with details as outlined above. They were monitored on telemetry overnight which demonstrated appropriate pacing .  Left chest was without hematoma or ecchymosis.  The device was interrogated and found to be functioning normally.  CXR was obtained and demonstrated no pneumothorax status post device implantation..  Wound care, arm mobility, and restrictions were reviewed with the patient.  The patient was examined and considered stable for discharge to home.   The patient's discharge medications include and beta blocker (Toprol ).  He was previously taken off ACE/ARB due to AKI, can re-visit as outpatient now s/p CRT.   Anticoagulation resumption This patient is not on anticoagulation.  Physical Exam: Vitals:   12/29/23 0420 12/29/23 0736 12/29/23 0810 12/29/23 1041  BP: 114/73 121/74  104/66  Pulse: 63 67    Resp: (!) 22  (!) 22 18  Temp: 98.6 F (37 C) 97.6 F (36.4 C)    TempSrc: Oral Oral    SpO2: 97% 99%    Weight:      Height:        GEN- NAD. A&O x 3.  HEENT: Normocephalic, atraumatic Lungs- CTAB, normal effort.  Heart- RRR. No M/G/R.  GI- Soft, NT, ND.  Extremities- No clubbing, cyanosis,  or edema Skin- Warm and dry, no rash or lesion. ICD site stable.  Discharge Medications:  Allergies as of 12/29/2023       Reactions   Doxazosin Mesylate Other (See Comments)   Drops in blood pressure    Losartan     Other reaction(s): Muscle pain   Testosterone     Other reaction(s): Erythrocytosis Other reaction(s): Erythrocytosis   Flomax  [tamsulosin  Hcl] Itching, Other (See Comments)   Drops in blood pressure    Penicillins Rash, Other (See Comments)   Has patient had a PCN reaction causing  immediate rash, facial/tongue/throat swelling, SOB or lightheadedness with hypotension: No Has patient had a PCN reaction causing severe rash involving mucus membranes or skin necrosis: No Has patient had a PCN reaction that required hospitalization No Has patient had a PCN reaction occurring within the last 10 years: No If all of the above answers are "NO", then may proceed with Cephalosporin use.   Sulfonamide Derivatives Rash   Trulicity  [dulaglutide ] Nausea And Vomiting   N&V, Dizziness        Medication List     TAKE these medications    acetaminophen  500 MG tablet Commonly known as: TYLENOL  Take 1,000 mg by mouth at bedtime as needed for mild pain.   aspirin  EC 81 MG tablet Take 1 tablet (81 mg total) by mouth daily.   azelastine  0.1 % nasal spray Commonly known as: ASTELIN  Place 2 sprays into both nostrils daily as needed for allergies or rhinitis. Use in each nostril as directed   cholestyramine  4 g packet Commonly known as: Questran  Take 0.5 packets (2 g total) by mouth 2 (two) times daily.   citalopram  10 MG tablet Commonly known as: CELEXA  Take 1 tablet (10 mg total) by mouth daily.   clotrimazole  1 % cream Commonly known as: LOTRIMIN  Apply 1 Application topically in the morning, at noon, and at bedtime.   diphenoxylate -atropine  2.5-0.025 MG tablet Commonly known as: Lomotil  Take 2 tablets by mouth 4 (four) times daily as needed for diarrhea or loose stools.   empagliflozin  25 MG Tabs tablet Commonly known as: Jardiance  TAKE 25 MG (1 TABLET) BY MOUTH DAILY BEFORE BREAKFAST. STOP PIOGLITAZONE    fexofenadine  180 MG tablet Commonly known as: ALLEGRA  Take 1 tablet (180 mg total) by mouth daily as needed. For allergies What changed: when to take this   finasteride  5 MG tablet Commonly known as: PROSCAR  TAKE 1 TABLET BY MOUTH EVERY DAY   hydrocortisone  2.5 % rectal cream Commonly known as: ANUSOL -HC Place 1 Application rectally daily as needed for  hemorrhoids or anal itching.   MAGNESIUM  PO Take 1 tablet by mouth daily.   metoprolol  succinate 25 MG 24 hr tablet Commonly known as: Toprol  XL Take 1 tablet (25 mg total) by mouth daily.   mirtazapine  7.5 MG tablet Commonly known as: REMERON  Take 1 tablet (7.5 mg total) by mouth at bedtime.   montelukast  10 MG tablet Commonly known as: SINGULAIR  TAKE 1 TABLET BY MOUTH EVERYDAY AT BEDTIME   nitroGLYCERIN  0.4 MG SL tablet Commonly known as: NITROSTAT  Place 1 tablet (0.4 mg total) under the tongue every 5 (five) minutes x 3 doses as needed for chest pain.   ondansetron  4 MG tablet Commonly known as: Zofran  Take 1 tablet (4 mg total) by mouth every 8 (eight) hours as needed for nausea or vomiting.   pantoprazole  40 MG tablet Commonly known as: PROTONIX  TAKE 1 TABLET BY MOUTH TWICE A DAY   Pataday  0.1 % ophthalmic solution Generic  drug: olopatadine  Place 1 drop into both eyes daily as needed for allergies.   rosuvastatin  40 MG tablet Commonly known as: CRESTOR  TAKE 1 TABLET BY MOUTH DAILY AT 6 PM.   sitaGLIPtin  100 MG tablet Commonly known as: JANUVIA  Take 100 mg by mouth daily.   spironolactone  25 MG tablet Commonly known as: Aldactone  Take 0.5 tablets (12.5 mg total) by mouth daily.   vitamin B-12 100 MCG tablet Commonly known as: CYANOCOBALAMIN  Take 100 mcg by mouth daily.   VITAMIN D -3 PO Take 1 tablet by mouth daily.   Zerviate  0.24 % Soln Generic drug: Cetirizine  HCl Apply 1 drop to eye 2 (two) times daily.        Disposition: Home with usual follow up as in AVS  Duration of Discharge Encounter:  APP time: 25 minutes  Signed, Tylene Galla, PA-C  12/29/2023 11:13 AM

## 2024-01-01 ENCOUNTER — Other Ambulatory Visit: Payer: Medicare HMO

## 2024-01-04 ENCOUNTER — Ambulatory Visit: Payer: Medicare HMO | Admitting: "Endocrinology

## 2024-01-06 ENCOUNTER — Other Ambulatory Visit: Payer: Self-pay | Admitting: Cardiovascular Disease

## 2024-01-09 ENCOUNTER — Other Ambulatory Visit: Payer: Self-pay

## 2024-01-09 NOTE — Telephone Encounter (Signed)
 Prescription Request  01/09/2024  LOV: 12/18/23  What is the name of the medication or equipment? montelukast  (SINGULAIR ) 10 MG tablet [[id number]]   Have you contacted your pharmacy to request a refill? Yes   Which pharmacy would you like this sent to?  [facility] [address] [place] - [address] [place] [address] Phone: [phone] Fax: [phone]    Patient notified that their request is being sent to the clinical staff for review and that they should receive a response within 2 business days.   Please advise at [facility] [phone]

## 2024-01-11 ENCOUNTER — Ambulatory Visit: Attending: Cardiovascular Disease

## 2024-01-11 DIAGNOSIS — I5021 Acute systolic (congestive) heart failure: Secondary | ICD-10-CM | POA: Diagnosis not present

## 2024-01-11 DIAGNOSIS — I442 Atrioventricular block, complete: Secondary | ICD-10-CM

## 2024-01-11 MED ORDER — MONTELUKAST SODIUM 10 MG PO TABS: 10.0000 mg | ORAL_TABLET | Freq: Every day | ORAL | 0 refills | Status: DC

## 2024-01-11 NOTE — Telephone Encounter (Signed)
 Requested Prescriptions  Pending Prescriptions Disp Refills   montelukast  (SINGULAIR ) 10 MG tablet 90 tablet 0    Sig: Take 1 tablet (10 mg total) by mouth at bedtime.     Pulmonology:  Leukotriene Inhibitors Passed - 01/11/2024  8:40 AM      Passed - Valid encounter within last 12 months    Recent Outpatient Visits           3 weeks ago Diarrhea, unspecified type   [facility] Amadeo June, MD   1 month ago Allergic rhinitis   [place] [facility] Cheril Cork, Cisco Crest, MD   5 months ago Hypercalcemia   [place] [facility] Austine Lefort, MD   7 months ago Hemorrhoids, unspecified hemorrhoid type   [place] [facility] Cheril Cork, Cisco Crest, MD   8 months ago Bloating    [facility] Pickard, Cisco Crest, MD

## 2024-01-11 NOTE — Progress Notes (Signed)
 Normal CRT-D chamber ICD wound check. Wound well healed. Presenting rhythm: AP/BVP 60 . Routine testing performed. Thresholds, sensing, and impedances consistent with implant measurements with 3.5V safety margin/auto capture until 3 month visit. No treated arrhythmias. Reviewed arm restrictions to continue for 6 weeks total post op. Reviewed shock plan.  Pt enrolled in remote follow-up.

## 2024-01-11 NOTE — Patient Instructions (Signed)
   After Your ICD (Implantable Cardiac Defibrillator)    Monitor your defibrillator site for redness, swelling, and drainage. Call the device clinic at [phone] if you experience these symptoms or fever/chills.  Your incision was closed with Steri-strips or staples:  You may shower 7 days after your procedure and wash your incision with soap and water . Avoid lotions, ointments, or perfumes over your incision until it is well-healed.  You may use a hot tub or a pool after your wound check appointment if the incision is completely closed.  Do not lift, push or pull greater than 10 pounds with the affected arm until 6 weeks after your procedure. Until after [facility]. There are no other restrictions in arm movement after your wound check appointment.  Your ICD is designed to protect you from life threatening heart rhythms. Because of this, you may receive a shock.   1 shock with no symptoms:  Call the office during business hours. 1 shock with symptoms (chest pain, chest pressure, dizziness, lightheadedness, shortness of breath, overall feeling unwell):  Call 911. If you experience 2 or more shocks in 24 hours:  Call 911. If you receive a shock, you should not drive.  [place] DMV - no driving for 6 months if you receive appropriate therapy from your ICD.   ICD Alerts:  Some alerts are vibratory and others beep. These are NOT emergencies. Please call our office to let us  know. If this occurs at night or on weekends, it can wait until the next business day. Send a remote transmission.  If your device is capable of reading fluid status (for heart failure), you will be offered monthly monitoring to review this with you.   Remote monitoring is used to monitor your ICD from home. This monitoring is scheduled every 91 days by our office. It allows us  to keep an eye on the functioning of your device to ensure it is working properly. You will routinely see your Electrophysiologist annually (more often  if necessary).

## 2024-01-12 ENCOUNTER — Telehealth: Payer: Self-pay

## 2024-01-12 NOTE — Telephone Encounter (Signed)
 Pt states he feel bad since he got his ICD checked yesterday. I had the pt send a manual transmission.

## 2024-01-12 NOTE — Telephone Encounter (Signed)
 Patient reports he went out today and felt "a drawing in my head." Stated things did feel right but shortly after it cleared up after. States he has been in and out of the heat today and when he came inside he felt better. Takes BP daily, today's reading was 118/69, HR 62. Patient encouraged of rest, refrain from heat outdoors for extended periods of time and adequate hydration. Explained to patient that he is still in the recovery phase of the procedure and may need to still take it easy for several more days/weeks. Patient instructed if any lightheadedness, syncope, chest pain, dizziness or shortness of breath occurred over the weekend to go to the emergency department. Patient voiced understanding. Patient advised if any concerns over the weekend that are non-urgent to call the office. Pt voiced understanding/agreeable to plan.   Remote transmission received and shows normal device function.

## 2024-01-15 ENCOUNTER — Other Ambulatory Visit: Payer: Self-pay | Admitting: Cardiovascular Disease

## 2024-01-15 DIAGNOSIS — I472 Ventricular tachycardia, unspecified: Secondary | ICD-10-CM

## 2024-01-15 DIAGNOSIS — I5042 Chronic combined systolic (congestive) and diastolic (congestive) heart failure: Secondary | ICD-10-CM

## 2024-01-15 DIAGNOSIS — E782 Mixed hyperlipidemia: Secondary | ICD-10-CM

## 2024-01-15 DIAGNOSIS — I2510 Atherosclerotic heart disease of native coronary artery without angina pectoris: Secondary | ICD-10-CM

## 2024-01-15 DIAGNOSIS — E785 Hyperlipidemia, unspecified: Secondary | ICD-10-CM

## 2024-01-15 DIAGNOSIS — I10 Essential (primary) hypertension: Secondary | ICD-10-CM

## 2024-01-22 ENCOUNTER — Other Ambulatory Visit: Payer: Self-pay | Admitting: Family Medicine

## 2024-01-22 DIAGNOSIS — G4709 Other insomnia: Secondary | ICD-10-CM

## 2024-01-23 ENCOUNTER — Other Ambulatory Visit: Payer: Self-pay

## 2024-01-23 ENCOUNTER — Telehealth: Payer: Self-pay

## 2024-01-23 DIAGNOSIS — E782 Mixed hyperlipidemia: Secondary | ICD-10-CM

## 2024-01-23 MED ORDER — ROSUVASTATIN CALCIUM 40 MG PO TABS: 40.0000 mg | ORAL_TABLET | Freq: Every day | ORAL | 3 refills | Status: AC

## 2024-01-23 NOTE — Telephone Encounter (Signed)
 Prescription Request  01/23/2024  LOV: 11/16/23  What is the name of the medication or equipment? rosuvastatin  (CRESTOR ) 40 MG tablet [[id number]]   Have you contacted your pharmacy to request a refill? Yes   Which pharmacy would you like this sent to?  [facility] [address] [place] - [address] [place] [address] Phone: [phone] Fax: [phone]    Patient notified that their request is being sent to the clinical staff for review and that they should receive a response within 2 business days.   Please advise at [facility] [phone]

## 2024-01-24 ENCOUNTER — Other Ambulatory Visit (HOSPITAL_COMMUNITY): Payer: Self-pay

## 2024-01-30 NOTE — Progress Notes (Signed)
 Remote ICD transmission.

## 2024-02-06 ENCOUNTER — Ambulatory Visit: Admitting: Family Medicine

## 2024-02-06 ENCOUNTER — Encounter: Payer: Self-pay | Admitting: Family Medicine

## 2024-02-06 VITALS — BP 119/70 | HR 63 | Ht 70.0 in | Wt 102.8 lb

## 2024-02-06 DIAGNOSIS — I10 Essential (primary) hypertension: Secondary | ICD-10-CM

## 2024-02-06 DIAGNOSIS — I2510 Atherosclerotic heart disease of native coronary artery without angina pectoris: Secondary | ICD-10-CM

## 2024-02-06 DIAGNOSIS — R300 Dysuria: Secondary | ICD-10-CM

## 2024-02-06 DIAGNOSIS — I472 Ventricular tachycardia, unspecified: Secondary | ICD-10-CM

## 2024-02-06 DIAGNOSIS — E785 Hyperlipidemia, unspecified: Secondary | ICD-10-CM | POA: Diagnosis not present

## 2024-02-06 DIAGNOSIS — E782 Mixed hyperlipidemia: Secondary | ICD-10-CM

## 2024-02-06 DIAGNOSIS — I5042 Chronic combined systolic (congestive) and diastolic (congestive) heart failure: Secondary | ICD-10-CM

## 2024-02-06 LAB — URINALYSIS, ROUTINE W REFLEX MICROSCOPIC
Bacteria, UA: NONE SEEN /HPF
Bilirubin Urine: NEGATIVE
Hgb urine dipstick: NEGATIVE
Hyaline Cast: NONE SEEN /LPF
Ketones, ur: NEGATIVE
Leukocytes,Ua: NEGATIVE
Nitrite: NEGATIVE
RBC / HPF: NONE SEEN /HPF (ref 0–2)
Specific Gravity, Urine: 1.01 (ref 1.001–1.035)
WBC, UA: NONE SEEN /HPF (ref 0–5)
pH: 5.5 (ref 5.0–8.0)

## 2024-02-06 LAB — MICROSCOPIC MESSAGE

## 2024-02-06 MED ORDER — CITALOPRAM HYDROBROMIDE 10 MG PO TABS: 10.0000 mg | ORAL_TABLET | Freq: Every day | ORAL | 1 refills | Status: DC

## 2024-02-06 MED ORDER — CLOTRIMAZOLE 1 % EX CREA: 1.0000 | TOPICAL_CREAM | Freq: Two times a day (BID) | CUTANEOUS | 2 refills | Status: DC

## 2024-02-06 NOTE — Progress Notes (Signed)
 Subjective:    Patient ID: Cameron Daniel, male    DOB: 18-Mar-1946, 78 y.o.   MRN: [id number]  HPI Patient was recently admitted to the hospital for complete heart block and symptomatic bradycardia.  He underwent biventricular pacing with ICD placement.  Carvedilol  was switched to Toprol .  He seems to be doing much better.  He has a paced rhythm today with a heart rate of 70 bpm.  He denies chest pain or shortness of breath.  His only concern is his hospitalization he does have increased urinary frequency.  He also believes that he may be developing a yeast infection on the head of his penis.  Urinalysis today shows no evidence of a urinary tract infection.  Specifically there is no leukocyte esterase or nitrates.  Examination of the testicles and penis revealed no obvious candidal infection.  Patient does have some tenderness to palpation in the right inguinal canal. Past Medical History:  Diagnosis Date   Allergic rhinitis, cause unspecified    Allergy     Anxiety    Arthritis    Cancer (HCC)    a couple places removed from eyelid   Carotid Doppler 04/2020   Carotid US  9/21: No evidence of ICA stenosis bilaterally; right vertebral artery stenosis   Cataract    Chronic combined systolic (congestive) and diastolic (congestive) heart failure (HCC)    Coronary atherosclerosis of unspecified type of vessel, native or graft    a. MI 1985 with unclear details. b. CABG 03/2019 with LIMA -> LAD, SVG -> OM, SVG to dRCA.   Depression    PTSD    Diverticulosis of colon (without mention of hemorrhage)    Esophageal reflux    Esophageal stricture    Essential hypertension    Former tobacco use    Hemorrhoids    Hyperlipidemia    ICD (implantable cardioverter-defibrillator) in place    Irritable bowel syndrome    Ischemic cardiomyopathy    Mild carotid artery disease (HCC)    a. 1-39% bilaterally 03/2019 duplex.   Myocardial infarction (HCC)    Nocturia    Obesity, unspecified     Orthostasis    Other acquired absence of organ    Personal history of colonic polyps    Psychosexual dysfunction with inhibited sexual excitement    Retroperitoneal bleed    Type II or unspecified type diabetes mellitus without mention of complication, not stated as uncontrolled    VT (ventricular tachycardia) (HCC)    Past Surgical History:  Procedure Laterality Date   ADENOIDECTOMY     CARPAL TUNNEL RELEASE     left    CHOLECYSTECTOMY     COLON RESECTION     18 inches   COLON SURGERY  2013   CORONARY ANGIOPLASTY     1985    CORONARY ARTERY BYPASS GRAFT N/A 04/26/2019   Procedure: CORONARY ARTERY BYPASS GRAFTING (CABG) x Three, using left internal mammary artery and right leg greater saphenous vein harvested endoscopically;  Surgeon: Bartley Lightning, MD;  Location: [facility] OR;  Service: Open Heart Surgery;  Laterality: N/A;   ELBOW BURSA SURGERY     EYE SURGERY     ICD GENERATOR CHANGEOUT N/A 12/28/2023   Procedure: ICD GENERATOR CHANGEOUT;  Surgeon: Boyce Byes, MD;  [facility] INVASIVE CV LAB;  Service: Cardiovascular;  Laterality: N/A;   ICD IMPLANT N/A 06/25/2020   Procedure: ICD IMPLANT;  Surgeon: Boyce Byes, MD;  Location: [facility] INVASIVE CV LAB;  Service: Cardiovascular;  Laterality: N/A;   LEAD INSERTION N/A 12/28/2023   Procedure: LEAD INSERTION;  Surgeon: Boyce Byes, MD;  Location: [facility] INVASIVE CV LAB;  Service: Cardiovascular;  Laterality: N/A;   LEFT HEART CATH AND CORS/GRAFTS ANGIOGRAPHY N/A 06/19/2020   Procedure: LEFT HEART CATH AND CORS/GRAFTS ANGIOGRAPHY;  Surgeon: Millicent Ally, MD;  Location: [facility] INVASIVE CV LAB;  Service: Cardiovascular;  Laterality: N/A;   LEFT HEART CATH AND CORS/GRAFTS ANGIOGRAPHY N/A 03/08/2023   Procedure: LEFT HEART CATH AND CORS/GRAFTS ANGIOGRAPHY;  Surgeon: Wenona Hamilton, MD;  Location: [facility] INVASIVE CV LAB;  Service: Cardiovascular;  Laterality: N/A;   POLYPECTOMY     RIGHT/LEFT HEART CATH AND CORONARY ANGIOGRAPHY N/A  04/16/2019   Procedure: RIGHT/LEFT HEART CATH AND CORONARY ANGIOGRAPHY;  Surgeon: Arty Binning, MD;  Location: [facility] INVASIVE CV LAB;  Service: Cardiovascular;  Laterality: N/A;   SHOULDER SURGERY     Rt. Shoulder   TEE WITHOUT CARDIOVERSION N/A 04/26/2019   Procedure: TRANSESOPHAGEAL ECHOCARDIOGRAM (TEE);  Surgeon: Bartley Lightning, MD;  Location: [facility] OR;  Service: Open Heart Surgery;  Laterality: N/A;   TONSILLECTOMY     V TACH ABLATION N/A 06/22/2020   Procedure: V TACH ABLATION;  Surgeon: Boyce Byes, MD;  Location: [facility] INVASIVE CV LAB;  Service: Cardiovascular;  Laterality: N/A;   Current Outpatient Medications on File Prior to Visit  Medication Sig Dispense Refill   acetaminophen  (TYLENOL ) 500 MG tablet Take 1,000 mg by mouth at bedtime as needed for mild pain.     aspirin  EC 81 MG tablet Take 1 tablet (81 mg total) by mouth daily. 90 tablet 3   azelastine  (ASTELIN ) 0.1 % nasal spray Place 2 sprays into both nostrils daily as needed for allergies or rhinitis. Use in each nostril as directed     Cetirizine  HCl (ZERVIATE ) 0.24 % SOLN Apply 1 drop to eye 2 (two) times daily. 30 each 3   Cholecalciferol (VITAMIN D -3 PO) Take 1 tablet by mouth daily.     cholestyramine  (QUESTRAN ) 4 g packet Take 0.5 packets (2 g total) by mouth 2 (two) times daily. 60 each 3   clotrimazole  (LOTRIMIN ) 1 % cream Apply 1 Application topically in the morning, at noon, and at bedtime. 30 g 1   diphenoxylate -atropine  (LOMOTIL ) 2.5-0.025 MG tablet Take 2 tablets by mouth 4 (four) times daily as needed for diarrhea or loose stools. 30 tablet 0   empagliflozin  (JARDIANCE ) 25 MG TABS tablet TAKE 25 MG (1 TABLET) BY MOUTH DAILY BEFORE BREAKFAST. STOP PIOGLITAZONE  90 tablet 0   fexofenadine  (ALLEGRA ) 180 MG tablet Take 1 tablet (180 mg total) by mouth daily as needed. For allergies (Patient taking differently: Take 180 mg by mouth daily. For allergies) 90 tablet 0   finasteride  (PROSCAR ) 5 MG tablet TAKE 1 TABLET BY  MOUTH EVERY DAY 90 tablet 3   hydrocortisone  (ANUSOL -HC) 2.5 % rectal cream Place 1 Application rectally daily as needed for hemorrhoids or anal itching. 30 g 1   MAGNESIUM  PO Take 1 tablet by mouth daily.     metoprolol  succinate (TOPROL  XL) 25 MG 24 hr tablet Take 1 tablet (25 mg total) by mouth daily. 30 tablet 11   montelukast  (SINGULAIR ) 10 MG tablet Take 1 tablet (10 mg total) by mouth at bedtime. 90 tablet 0   nitroGLYCERIN  (NITROSTAT ) 0.4 MG SL tablet Place 1 tablet (0.4 mg total) under the tongue every 5 (five) minutes x 3 doses as needed for chest pain. 25 tablet 12  olopatadine  (PATADAY ) 0.1 % ophthalmic solution Place 1 drop into both eyes daily as needed for allergies.     ondansetron  (ZOFRAN ) 4 MG tablet Take 1 tablet (4 mg total) by mouth every 8 (eight) hours as needed for nausea or vomiting. 31 tablet 0   pantoprazole  (PROTONIX ) 40 MG tablet TAKE 1 TABLET BY MOUTH TWICE A DAY 180 tablet 3   rosuvastatin  (CRESTOR ) 40 MG tablet Take 1 tablet (40 mg total) by mouth daily. 90 tablet 3   sitaGLIPtin  (JANUVIA ) 100 MG tablet Take 100 mg by mouth daily.     spironolactone  (ALDACTONE ) 25 MG tablet TAKE 1/2 TABLET BY MOUTH EVERY DAY 45 tablet 3   vitamin B-12 (CYANOCOBALAMIN ) 100 MCG tablet Take 100 mcg by mouth daily.     No current facility-administered medications on file prior to visit.   Allergies  Allergen Reactions   Doxazosin Mesylate Other (See Comments)    Drops in blood pressure    Losartan      Other reaction(s): Muscle pain   Testosterone      Other reaction(s): Erythrocytosis Other reaction(s): Erythrocytosis   Flomax  [Tamsulosin  Hcl] Itching and Other (See Comments)    Drops in blood pressure     Penicillins Rash and Other (See Comments)    Has patient had a PCN reaction causing immediate rash, facial/tongue/throat swelling, SOB or lightheadedness with hypotension: No Has patient had a PCN reaction causing severe rash involving mucus membranes or skin necrosis:  No Has patient had a PCN reaction that required hospitalization No Has patient had a PCN reaction occurring within the last 10 years: No If all of the above answers are "NO", then may proceed with Cephalosporin use.    Sulfonamide Derivatives Rash   Trulicity  [Dulaglutide ] Nausea And Vomiting    N&V, Dizziness   Social History   Socioeconomic History   Marital status: Married    Spouse name: Not on file   Number of children: 1   Years of education: 12   Highest education level: High school graduate  Occupational History   Occupation: Therapist, sports- retired  Tobacco Use   Smoking status: Former    Current packs/day: 0.00    Types: Cigarettes    Quit date: 08/30/1983    Years since quitting: 40.4   Smokeless tobacco: Former   Tobacco comments:    Quit 30 years ago.  Vaping Use   Vaping status: Never Used  Substance and Sexual Activity   Alcohol use: Not Currently    Comment: rare   Drug use: No   Sexual activity: Yes  Other Topics Concern   Not on file  Social History Narrative   Not on file   Social Drivers of Health   Financial Resource Strain: Low Risk  (12/22/2022)   Overall Financial Resource Strain (CARDIA)    Difficulty of Paying Living Expenses: Not hard at all  Food Insecurity: No Food Insecurity (12/29/2023)   Hunger Vital Sign    Worried About Running Out of Food in the Last Year: Never true    Ran Out of Food in the Last Year: Never true  Transportation Needs: No Transportation Needs (12/29/2023)   PRAPARE - Administrator, Civil Service (Medical): No    Lack of Transportation (Non-Medical): No  Physical Activity: Insufficiently Active (12/22/2022)   Exercise Vital Sign    Days of Exercise per Week: 3 days    Minutes of Exercise per Session: 30 min  Stress: No Stress Concern Present (12/22/2022)   [place]  Institute of Occupational Health - Occupational Stress Questionnaire    Feeling of Stress : Not at all  Social Connections: Moderately Isolated  (12/29/2023)   Social Connection and Isolation Panel [NHANES]    Frequency of Communication with Friends and Family: Once a week    Frequency of Social Gatherings with Friends and Family: Once a week    Attends Religious Services: Never    Database administrator or Organizations: No    Attends Engineer, structural: 1 to 4 times per year    Marital Status: Married  Catering manager Violence: Not At Risk (12/29/2023)   Humiliation, Afraid, Rape, and Kick questionnaire    Fear of Current or Ex-Partner: No    Emotionally Abused: No    Physically Abused: No    Sexually Abused: No     Review of Systems     Objective:   Physical Exam Vitals reviewed.  Constitutional:      Appearance: Normal appearance.  Eyes:     General: Lids are normal.     Extraocular Movements:     Right eye: Normal extraocular motion.     Left eye: Normal extraocular motion.  Cardiovascular:     Rate and Rhythm: Normal rate and regular rhythm.     Pulses: Normal pulses.     Heart sounds: Murmur heard.  Pulmonary:     Effort: Pulmonary effort is normal. No respiratory distress.     Breath sounds: Normal breath sounds. No wheezing, rhonchi or rales.  Genitourinary:    Penis: Normal. No tenderness, discharge or swelling.      Testes: Normal.  Musculoskeletal:     Right lower leg: No edema.     Left lower leg: No edema.  Neurological:     Mental Status: He is alert.           Assessment & Plan:   Dysuria - Plan: CBC with Differential/Platelet, Comprehensive metabolic panel with GFR, PSA, Urinalysis, Routine w reflex microscopic  Coronary artery disease involving native coronary artery of native heart without angina pectoris - Plan: citalopram  (CELEXA ) 10 MG tablet  CAD in native artery - Plan: citalopram  (CELEXA ) 10 MG tablet  Chronic combined systolic and diastolic CHF (congestive heart failure) (HCC) - Plan: citalopram  (CELEXA ) 10 MG tablet  Ventricular tachycardia (HCC) - Plan:  citalopram  (CELEXA ) 10 MG tablet  Mixed hyperlipidemia - Plan: citalopram  (CELEXA ) 10 MG tablet  Essential hypertension - Plan: citalopram  (CELEXA ) 10 MG tablet  Hyperlipidemia LDL goal <70 - Plan: citalopram  (CELEXA ) 10 MG tablet Patient is in a paced rhythm today.  His heart rate is controlled.  His blood pressure is acceptable.  He is feeling better.  Urinalysis shows no evidence of urinary tract infection.  I will give the patient clotrimazole  cream to place on the glans penis twice daily to help prevent any balanitis from Jardiance .  I refilled his Celexa  as requested.  Due to the increased frequency of urination I will check a PSA today.  I will also check a CBC and a CMP to monitor electrolytes and the count.

## 2024-02-07 LAB — COMPREHENSIVE METABOLIC PANEL WITH GFR
AG Ratio: 2.5 (calc) (ref 1.0–2.5)
ALT: 25 U/L (ref 9–46)
AST: 30 U/L (ref 10–35)
Albumin: 4.8 g/dL (ref 3.6–5.1)
Alkaline phosphatase (APISO): 99 U/L (ref 35–144)
BUN: 18 mg/dL (ref 7–25)
CO2: 31 mmol/L (ref 20–32)
Calcium: 10.9 mg/dL — ABNORMAL HIGH (ref 8.6–10.3)
Chloride: 95 mmol/L — ABNORMAL LOW (ref 98–110)
Creat: 0.83 mg/dL (ref 0.70–1.28)
Globulin: 1.9 g/dL (ref 1.9–3.7)
Glucose, Bld: 149 mg/dL — ABNORMAL HIGH (ref 65–99)
Potassium: 4.3 mmol/L (ref 3.5–5.3)
Sodium: 135 mmol/L (ref 135–146)
Total Bilirubin: 1.2 mg/dL (ref 0.2–1.2)
Total Protein: 6.7 g/dL (ref 6.1–8.1)
eGFR: 90 mL/min/{1.73_m2} (ref >=60)

## 2024-02-07 LAB — CBC WITH DIFFERENTIAL/PLATELET
Absolute Lymphocytes: 2182 {cells}/uL (ref 850–3900)
Absolute Monocytes: 879 {cells}/uL (ref 200–950)
Basophils Absolute: 40 {cells}/uL (ref 0–200)
Basophils Relative: 0.4 %
Eosinophils Absolute: 71 {cells}/uL (ref 15–500)
Eosinophils Relative: 0.7 %
HCT: 53.1 % — ABNORMAL HIGH (ref 38.5–50.0)
Hemoglobin: 16.8 g/dL (ref 13.2–17.1)
MCH: 28.5 pg (ref 27.0–33.0)
MCHC: 31.6 g/dL — ABNORMAL LOW (ref 32.0–36.0)
MCV: 90.2 fL (ref 80.0–100.0)
MPV: 10.7 fL (ref 7.5–12.5)
Monocytes Relative: 8.7 %
Neutro Abs: 6929 {cells}/uL (ref 1500–7800)
Neutrophils Relative %: 68.6 %
Platelets: 158 Thousand/uL (ref 140–400)
RBC: 5.89 Million/uL — ABNORMAL HIGH (ref 4.20–5.80)
RDW: 14 % (ref 11.0–15.0)
Total Lymphocyte: 21.6 %
WBC: 10.1 Thousand/uL (ref 3.8–10.8)

## 2024-02-07 LAB — PSA: PSA: 0.26 ng/mL (ref <=4.00)

## 2024-02-08 ENCOUNTER — Ambulatory Visit: Payer: Self-pay | Admitting: Family Medicine

## 2024-02-13 ENCOUNTER — Encounter: Payer: Self-pay | Admitting: Family Medicine

## 2024-02-19 ENCOUNTER — Other Ambulatory Visit

## 2024-02-19 DIAGNOSIS — E213 Hyperparathyroidism, unspecified: Secondary | ICD-10-CM | POA: Diagnosis not present

## 2024-02-19 DIAGNOSIS — E559 Vitamin D deficiency, unspecified: Secondary | ICD-10-CM | POA: Diagnosis not present

## 2024-02-20 LAB — RENAL FUNCTION PANEL
Albumin: 4.6 g/dL (ref 3.6–5.1)
BUN: 14 mg/dL (ref 7–25)
CO2: 29 mmol/L (ref 20–32)
Calcium: 11 mg/dL — ABNORMAL HIGH (ref 8.6–10.3)
Chloride: 99 mmol/L (ref 98–110)
Creat: 0.92 mg/dL (ref 0.70–1.28)
Glucose, Bld: 134 mg/dL — ABNORMAL HIGH (ref 65–99)
Phosphorus: 3.3 mg/dL (ref 2.1–4.3)
Potassium: 5.2 mmol/L (ref 3.5–5.3)
Sodium: 137 mmol/L (ref 135–146)

## 2024-02-20 LAB — VITAMIN D 25 HYDROXY (VIT D DEFICIENCY, FRACTURES): Vit D, 25-Hydroxy: 35 ng/mL (ref 30–100)

## 2024-02-20 LAB — PTH, INTACT AND CALCIUM
Calcium: 11 mg/dL — ABNORMAL HIGH (ref 8.6–10.3)
PTH: 132 pg/mL — ABNORMAL HIGH (ref 16–77)

## 2024-02-22 ENCOUNTER — Encounter: Payer: Self-pay | Admitting: "Endocrinology

## 2024-02-22 ENCOUNTER — Ambulatory Visit: Admitting: "Endocrinology

## 2024-02-22 VITALS — BP 110/70 | HR 66 | Ht 70.0 in | Wt 183.0 lb

## 2024-02-22 DIAGNOSIS — E210 Primary hyperparathyroidism: Secondary | ICD-10-CM

## 2024-02-22 NOTE — Progress Notes (Signed)
 Outpatient Endocrinology Note Cameron Birmingham, MD    Cameron Daniel 08-31-1945 [id number]  Referring Provider: Duanne Butler DASEN, MD Primary Care Provider: Duanne Butler DASEN, MD Reason for consultation: Subjective   Assessment & Plan  Diagnoses and all orders for this visit:  Hypercalcemia -     Calcium , 24-Hour Urine with Creatinine; Future -     DG DXA BODY COMPOSITION; Future  Primary hyperparathyroidism (HCC) -     Calcium , 24-Hour Urine with Creatinine; Future -     DG DXA BODY COMPOSITION; Future    Patient referred for hypercalcemia in the setting of vitamin D  deficiency and hyperparathyroidism, likely primary in nature, with history of kidney stones 09/21/2023: Vitamin D  low at 22, magnesium  also found to be low normal at 1.7, corrected calcium  at 10.6 02/19/24: repeat labs show PTH elevated at 132 with Corrected Calcium  at 10.5 Continue 2000 units of vitamin D  every day as well as magnesium  400 mg daily Ordered bone density as well as 24-hour urine collection  Patient reports significant weight loss recently Reports that he had stopped eating [place] fries that he used to eat, no other major change Remote smoker in the past Discussed high-protein diet Patient to continue follow-up with PCP  Return in about 6 months (around 08/23/2024) for visit + labs before next visit, pick up container for 24 hour urine today.   I have reviewed current medications, nurse's notes, allergies, vital signs, past medical and surgical history, family medical history, and social history for this encounter. Counseled patient on symptoms, examination findings, lab findings, imaging results, treatment decisions and monitoring and prognosis. The patient understood the recommendations and agrees with the treatment plan. All questions regarding treatment plan were fully answered.  Cameron Birmingham, MD  02/22/24   History of Present Illness HPI  Cameron Daniel is a 78 y.o. male  referred by Dr. Duanne for evaluation and management of hypercalcemia.    Patient goes to [facility] in [place] for some medial issues C/o weight los and triple bypass Served in [place] and reports exposure to  agent orange and asbestos Had colon surgery   Patient had a history of kidney stones 10-15 years ago, one episode. He  current hematuria No polyuria Yes nocturia Yes thirst Yes renal failure No anorexia Yes abdominal pain No heartburn Yes, on protonix  constipation Yes, varies between constipation/diarrhea/normal  nausea or vomiting No history of peptic ulcer disease No depression Yes, diagnosed of PTSD confusion No excessive fatigue Yes fracture No osteoporosis No headaches Yes numbness Yes tingling No  He takes Calcium  No He takes Vitamin D  supplements No  He a history of taking chronic lithium No He a recent history of thiazide diuretic intake No  He family history of renal stones/hypercalcemia Yes, son had more than one episode of kidney stones, day may have had one episode of kidney stones  a personal history of MEN syndromes/medullary thyroid cancer/ pheochromocytoma No  Physical Exam  BP 110/70   Pulse 66   Ht 5' 10 (1.778 m)   Wt 183 lb (83 kg)   SpO2 97%   BMI 26.26 kg/m    Constitutional: well developed, well nourished Head: normocephalic, atraumatic Eyes: sclera anicteric, no redness Neck: supple Lungs: normal respiratory effort Neurology: alert and oriented Skin: dry, no appreciable rashes Musculoskeletal: no appreciable defects Psychiatric: normal mood and affect   Current Medications Patient's Medications  New Prescriptions   No medications on file  Previous Medications   ACETAMINOPHEN  (TYLENOL ) 500  MG TABLET    Take 1,000 mg by mouth at bedtime as needed for mild pain.   ASPIRIN  EC 81 MG TABLET    Take 1 tablet (81 mg total) by mouth daily.   AZELASTINE  (ASTELIN ) 0.1 % NASAL SPRAY    Place 2 sprays into both nostrils daily as needed for  allergies or rhinitis. Use in each nostril as directed   CETIRIZINE  HCL (ZERVIATE ) 0.24 % SOLN    Apply 1 drop to eye 2 (two) times daily.   CHOLECALCIFEROL (VITAMIN D -3 PO)    Take 1 tablet by mouth daily.   CHOLESTYRAMINE  (QUESTRAN ) 4 G PACKET    Take 0.5 packets (2 g total) by mouth 2 (two) times daily.   CITALOPRAM  (CELEXA ) 10 MG TABLET    Take 1 tablet (10 mg total) by mouth daily.   CLOTRIMAZOLE  (LOTRIMIN ) 1 % CREAM    Apply 1 Application topically in the morning, at noon, and at bedtime.   CLOTRIMAZOLE  (LOTRIMIN ) 1 % CREAM    Apply 1 Application topically 2 (two) times daily.   DIPHENOXYLATE -ATROPINE  (LOMOTIL ) 2.5-0.025 MG TABLET    Take 2 tablets by mouth 4 (four) times daily as needed for diarrhea or loose stools.   EMPAGLIFLOZIN  (JARDIANCE ) 25 MG TABS TABLET    TAKE 25 MG (1 TABLET) BY MOUTH DAILY BEFORE BREAKFAST. STOP PIOGLITAZONE    FEXOFENADINE  (ALLEGRA ) 180 MG TABLET    Take 1 tablet (180 mg total) by mouth daily as needed. For allergies   FINASTERIDE  (PROSCAR ) 5 MG TABLET    TAKE 1 TABLET BY MOUTH EVERY DAY   HYDROCORTISONE  (ANUSOL -HC) 2.5 % RECTAL CREAM    Place 1 Application rectally daily as needed for hemorrhoids or anal itching.   MAGNESIUM  PO    Take 1 tablet by mouth daily.   METOPROLOL  SUCCINATE (TOPROL  XL) 25 MG 24 HR TABLET    Take 1 tablet (25 mg total) by mouth daily.   MONTELUKAST  (SINGULAIR ) 10 MG TABLET    Take 1 tablet (10 mg total) by mouth at bedtime.   NITROGLYCERIN  (NITROSTAT ) 0.4 MG SL TABLET    Place 1 tablet (0.4 mg total) under the tongue every 5 (five) minutes x 3 doses as needed for chest pain.   OLOPATADINE  (PATADAY ) 0.1 % OPHTHALMIC SOLUTION    Place 1 drop into both eyes daily as needed for allergies.   ONDANSETRON  (ZOFRAN ) 4 MG TABLET    Take 1 tablet (4 mg total) by mouth every 8 (eight) hours as needed for nausea or vomiting.   PANTOPRAZOLE  (PROTONIX ) 40 MG TABLET    TAKE 1 TABLET BY MOUTH TWICE A DAY   ROSUVASTATIN  (CRESTOR ) 40 MG TABLET    Take 1  tablet (40 mg total) by mouth daily.   SITAGLIPTIN  (JANUVIA ) 100 MG TABLET    Take 100 mg by mouth daily.   SPIRONOLACTONE  (ALDACTONE ) 25 MG TABLET    TAKE 1/2 TABLET BY MOUTH EVERY DAY   VITAMIN B-12 (CYANOCOBALAMIN ) 100 MCG TABLET    Take 100 mcg by mouth daily.  Modified Medications   No medications on file  Discontinued Medications   No medications on file    Allergies Allergies  Allergen Reactions   Doxazosin Mesylate Other (See Comments)    Drops in blood pressure    Losartan      Other reaction(s): Muscle pain   Testosterone      Other reaction(s): Erythrocytosis Other reaction(s): Erythrocytosis   Flomax  [Tamsulosin  Hcl] Itching and Other (See Comments)    Drops  in blood pressure     Penicillins Rash and Other (See Comments)    Has patient had a PCN reaction causing immediate rash, facial/tongue/throat swelling, SOB or lightheadedness with hypotension: No Has patient had a PCN reaction causing severe rash involving mucus membranes or skin necrosis: No Has patient had a PCN reaction that required hospitalization No Has patient had a PCN reaction occurring within the last 10 years: No If all of the above answers are NO, then may proceed with Cephalosporin use.    Sulfonamide Derivatives Rash   Trulicity  [Dulaglutide ] Nausea And Vomiting    N&V, Dizziness    Past Medical History Past Medical History:  Diagnosis Date   Allergic rhinitis, cause unspecified    Allergy     Anxiety    Arthritis    Cancer (HCC)    a couple places removed from eyelid   Carotid Doppler 04/2020   Carotid US  9/21: No evidence of ICA stenosis bilaterally; right vertebral artery stenosis   Cataract    Chronic combined systolic (congestive) and diastolic (congestive) heart failure (HCC)    Coronary atherosclerosis of unspecified type of vessel, native or graft    a. MI 1985 with unclear details. b. CABG 03/2019 with LIMA -> LAD, SVG -> OM, SVG to dRCA.   Depression    PTSD    Diverticulosis  of colon (without mention of hemorrhage)    Esophageal reflux    Esophageal stricture    Essential hypertension    Former tobacco use    Hemorrhoids    Hyperlipidemia    ICD (implantable cardioverter-defibrillator) in place    Irritable bowel syndrome    Ischemic cardiomyopathy    Mild carotid artery disease (HCC)    a. 1-39% bilaterally 03/2019 duplex.   Myocardial infarction (HCC)    Nocturia    Obesity, unspecified    Orthostasis    Other acquired absence of organ    Personal history of colonic polyps    Psychosexual dysfunction with inhibited sexual excitement    Retroperitoneal bleed    Type II or unspecified type diabetes mellitus without mention of complication, not stated as uncontrolled    VT (ventricular tachycardia) (HCC)     Past Surgical History Past Surgical History:  Procedure Laterality Date   ADENOIDECTOMY     CARPAL TUNNEL RELEASE     left    CHOLECYSTECTOMY     COLON RESECTION     18 inches   COLON SURGERY  2013   CORONARY ANGIOPLASTY     1985    CORONARY ARTERY BYPASS GRAFT N/A 04/26/2019   Procedure: CORONARY ARTERY BYPASS GRAFTING (CABG) x Three, using left internal mammary artery and right leg greater saphenous vein harvested endoscopically;  Surgeon: Lucas Dorise POUR, MD;  Location: [facility] OR;  Service: Open Heart Surgery;  Laterality: N/A;   ELBOW BURSA SURGERY     EYE SURGERY     ICD GENERATOR CHANGEOUT N/A 12/28/2023   Procedure: ICD GENERATOR CHANGEOUT;  Surgeon: Cindie Ole DASEN, MD;  Location[facility] INVASIVE CV LAB;  Service: Cardiovascular;  Laterality: N/A;   ICD IMPLANT N/A 06/25/2020   Procedure: ICD IMPLANT;  Surgeon: Cindie Ole DASEN, MD;  Location: [facility] INVASIVE CV LAB;  Service: Cardiovascular;  Laterality: N/A;   LEAD INSERTION N/A 12/28/2023   Procedure: LEAD INSERTION;  Surgeon: Cindie Ole DASEN, MD;  Location: [facility] INVASIVE CV LAB;  Service: Cardiovascular;  Laterality: N/A;   LEFT HEART CATH AND CORS/GRAFTS ANGIOGRAPHY N/A 06/19/2020    Procedure:  LEFT HEART CATH AND CORS/GRAFTS ANGIOGRAPHY;  Surgeon: Burnard Debby LABOR, MD;  Location: [facility] INVASIVE CV LAB;  Service: Cardiovascular;  Laterality: N/A;   LEFT HEART CATH AND CORS/GRAFTS ANGIOGRAPHY N/A 03/08/2023   Procedure: LEFT HEART CATH AND CORS/GRAFTS ANGIOGRAPHY;  Surgeon: Darron Deatrice LABOR, MD;  Location: [facility] INVASIVE CV LAB;  Service: Cardiovascular;  Laterality: N/A;   POLYPECTOMY     RIGHT/LEFT HEART CATH AND CORONARY ANGIOGRAPHY N/A 04/16/2019   Procedure: RIGHT/LEFT HEART CATH AND CORONARY ANGIOGRAPHY;  Surgeon: Claudene Victory ORN, MD;  Location: [facility] INVASIVE CV LAB;  Service: Cardiovascular;  Laterality: N/A;   SHOULDER SURGERY     Rt. Shoulder   TEE WITHOUT CARDIOVERSION N/A 04/26/2019   Procedure: TRANSESOPHAGEAL ECHOCARDIOGRAM (TEE);  Surgeon: Lucas Dorise POUR, MD;  Location: [facility] OR;  Service: Open Heart Surgery;  Laterality: N/A;   TONSILLECTOMY     V TACH ABLATION N/A 06/22/2020   Procedure: V TACH ABLATION;  Surgeon: Cindie Ole DASEN, MD;  Location: [facility] INVASIVE CV LAB;  Service: Cardiovascular;  Laterality: N/A;    Family History family history includes Allergic rhinitis in his father; Coronary artery disease in his mother; Lung cancer in his father.  Social History Social History   Socioeconomic History   Marital status: Married    Spouse name: Not on file   Number of children: 1   Years of education: 12   Highest education level: High school graduate  Occupational History   Occupation: Therapist, sports- retired  Tobacco Use   Smoking status: Former    Current packs/day: 0.00    Types: Cigarettes    Quit date: 08/30/1983    Years since quitting: 40.5   Smokeless tobacco: Former   Tobacco comments:    Quit 30 years ago.  Vaping Use   Vaping status: Never Used  Substance and Sexual Activity   Alcohol use: Not Currently    Comment: rare   Drug use: No   Sexual activity: Yes  Other Topics Concern   Not on file  Social History Narrative   Not on file   Social  Drivers of Health   Financial Resource Strain: Low Risk  (12/22/2022)   Overall Financial Resource Strain (CARDIA)    Difficulty of Paying Living Expenses: Not hard at all  Food Insecurity: No Food Insecurity (12/29/2023)   Hunger Vital Sign    Worried About Running Out of Food in the Last Year: Never true    Ran Out of Food in the Last Year: Never true  Transportation Needs: No Transportation Needs (12/29/2023)   PRAPARE - Administrator, Civil Service (Medical): No    Lack of Transportation (Non-Medical): No  Physical Activity: Insufficiently Active (12/22/2022)   Exercise Vital Sign    Days of Exercise per Week: 3 days    Minutes of Exercise per Session: 30 min  Stress: No Stress Concern Present (12/22/2022)   [employer] of Occupational Health - Occupational Stress Questionnaire    Feeling of Stress : Not at all  Social Connections: Moderately Isolated (12/29/2023)   Social Connection and Isolation Panel    Frequency of Communication with Friends and Family: Once a week    Frequency of Social Gatherings with Friends and Family: Once a week    Attends Religious Services: Never    Database administrator or Organizations: No    Attends Engineer, structural: 1 to 4 times per year    Marital Status: Married  Catering manager Violence: Not At  Risk (12/29/2023)   Humiliation, Afraid, Rape, and Kick questionnaire    Fear of Current or Ex-Partner: No    Emotionally Abused: No    Physically Abused: No    Sexually Abused: No    Lab Results  Component Value Date   CHOL 120 12/15/2023   Lab Results  Component Value Date   HDL 63 12/15/2023   Lab Results  Component Value Date   LDLCALC 35 12/15/2023   Lab Results  Component Value Date   TRIG 124 12/15/2023   Lab Results  Component Value Date   CHOLHDL 1.9 12/15/2023   Lab Results  Component Value Date   CREATININE 0.92 02/19/2024   No results found for: GFR    Component Value Date/Time   NA 137  02/19/2024 1316   NA 137 03/01/2023 1412   K 5.2 02/19/2024 1316   CL 99 02/19/2024 1316   CO2 29 02/19/2024 1316   GLUCOSE 134 (H) 02/19/2024 1316   BUN 14 02/19/2024 1316   BUN 11 03/01/2023 1412   CREATININE 0.92 02/19/2024 1316   CALCIUM  11.0 (H) 02/19/2024 1316   CALCIUM  11.0 (H) 02/19/2024 1316   PROT 6.7 02/06/2024 1522   PROT 6.1 12/15/2023 1415   ALBUMIN  4.6 12/15/2023 1415   AST 30 02/06/2024 1522   ALT 25 02/06/2024 1522   ALKPHOS 103 12/15/2023 1415   BILITOT 1.2 02/06/2024 1522   BILITOT 0.5 12/15/2023 1415   GFRNONAA >60 12/22/2023 1504   GFRNONAA 86 01/15/2021 0847   GFRAA 100 01/15/2021 0847      Latest Ref Rng & Units 02/19/2024    1:16 PM 02/06/2024    3:22 PM 12/22/2023    3:04 PM  BMP  Glucose 65 - 99 mg/dL 865  850    BUN 7 - 25 mg/dL 14  18    Creatinine 9.29 - 1.28 mg/dL 9.07  9.16  8.88   BUN/Creat Ratio 6 - 22 (calc) SEE NOTE:  SEE NOTE:    Sodium 135 - 146 mmol/L 137  135    Potassium 3.5 - 5.3 mmol/L 5.2  4.3    Chloride 98 - 110 mmol/L 99  95    CO2 20 - 32 mmol/L 29  31    Calcium  8.6 - 10.3 mg/dL 8.6 - 89.6 mg/dL 88.9    88.9  89.0         Component Value Date/Time   WBC 10.1 02/06/2024 1522   RBC 5.89 (H) 02/06/2024 1522   HGB 16.8 02/06/2024 1522   HGB 15.4 03/01/2023 1412   HCT 53.1 (H) 02/06/2024 1522   HCT 47.9 03/01/2023 1412   PLT 158 02/06/2024 1522   PLT 124 (L) 03/01/2023 1412   MCV 90.2 02/06/2024 1522   MCV 88 03/01/2023 1412   MCH 28.5 02/06/2024 1522   MCHC 31.6 (L) 02/06/2024 1522   RDW 14.0 02/06/2024 1522   RDW 14.4 03/01/2023 1412   LYMPHSABS 1,673 12/05/2022 1543   MONOABS 0.8 06/19/2020 0833   EOSABS 71 02/06/2024 1522   BASOSABS 40 02/06/2024 1522   Lab Results  Component Value Date   TSH 1.32 12/05/2022   TSH 1.40 05/10/2022   TSH 2.142 04/21/2019   FREET4 1.23 12/14/2007         Parts of this note may have been dictated using voice recognition software. There may be variances in spelling and  vocabulary which are unintentional. Not all errors are proofread. Please notify the dino if any discrepancies are  noted or if the meaning of any statement is not clear.

## 2024-02-26 DIAGNOSIS — E119 Type 2 diabetes mellitus without complications: Secondary | ICD-10-CM | POA: Diagnosis not present

## 2024-03-15 ENCOUNTER — Ambulatory Visit: Admitting: Family Medicine

## 2024-03-18 ENCOUNTER — Ambulatory Visit: Admitting: Family Medicine

## 2024-03-18 ENCOUNTER — Encounter: Payer: Self-pay | Admitting: Family Medicine

## 2024-03-18 VITALS — BP 130/78 | HR 77 | Temp 97.5°F | Ht 70.0 in | Wt 181.0 lb

## 2024-03-18 DIAGNOSIS — E210 Primary hyperparathyroidism: Secondary | ICD-10-CM

## 2024-03-18 NOTE — Progress Notes (Signed)
 Subjective:    Patient ID: Cameron Daniel, male    DOB: 10-17-1945, 78 y.o.   MRN: [id number]  Patient has a history of primary hyperparathyroidism.  I refer the patient to endocrinology for further evaluation.  She confirmed the diagnosis.  However patient would like a second opinion regarding treatment strategies.  At the present time she has recommended clinical monitoring.  The patient was not started on a bisphosphonate.  He has multiple medical symptoms that could be attributed to his high calcium  including increased urinary frequency, abdominal bloating and constipation, and muscle weakness.  He has a history lung cancer.  He has a history of coronary artery disease and therefore I am concerned about increasing calcifications in his coronary artery. Past Medical History:  Diagnosis Date   Allergic rhinitis, cause unspecified    Allergy     Anxiety    Arthritis    Cancer (HCC)    a couple places removed from eyelid   Carotid Doppler 04/2020   Carotid US  9/21: No evidence of ICA stenosis bilaterally; right vertebral artery stenosis   Cataract    Chronic combined systolic (congestive) and diastolic (congestive) heart failure (HCC)    Coronary atherosclerosis of unspecified type of vessel, native or graft    a. MI 1985 with unclear details. b. CABG 03/2019 with LIMA -> LAD, SVG -> OM, SVG to dRCA.   Depression    PTSD    Diverticulosis of colon (without mention of hemorrhage)    Esophageal reflux    Esophageal stricture    Essential hypertension    Former tobacco use    Hemorrhoids    Hyperlipidemia    ICD (implantable cardioverter-defibrillator) in place    Irritable bowel syndrome    Ischemic cardiomyopathy    Mild carotid artery disease (HCC)    a. 1-39% bilaterally 03/2019 duplex.   Myocardial infarction (HCC)    Nocturia    Obesity, unspecified    Orthostasis    Other acquired absence of organ    Personal history of colonic polyps    Psychosexual dysfunction with  inhibited sexual excitement    Retroperitoneal bleed    Type II or unspecified type diabetes mellitus without mention of complication, not stated as uncontrolled    VT (ventricular tachycardia) (HCC)    Past Surgical History:  Procedure Laterality Date   ADENOIDECTOMY     CARPAL TUNNEL RELEASE     left    CHOLECYSTECTOMY     COLON RESECTION     18 inches   COLON SURGERY  2013   CORONARY ANGIOPLASTY     1985    CORONARY ARTERY BYPASS GRAFT N/A 04/26/2019   Procedure: CORONARY ARTERY BYPASS GRAFTING (CABG) x Three, using left internal mammary artery and right leg greater saphenous vein harvested endoscopically;  Surgeon: Lucas Dorise POUR, MD;  Location: [facility] OR;  Service: Open Heart Surgery;  Laterality: N/A;   ELBOW BURSA SURGERY     EYE SURGERY     ICD GENERATOR CHANGEOUT N/A 12/28/2023   Procedure: ICD GENERATOR CHANGEOUT;  Surgeon: Cindie Ole DASEN, MD;  Location: [facility] INVASIVE CV LAB;  Service: Cardiovascular;  Laterality: N/A;   ICD IMPLANT N/A 06/25/2020   Procedure: ICD IMPLANT;  Surgeon: Cindie Ole DASEN, MD;  Location: [facility] INVASIVE CV LAB;  Service: Cardiovascular;  Laterality: N/A;   LEAD INSERTION N/A 12/28/2023   Procedure: LEAD INSERTION;  Surgeon: Cindie Ole DASEN, MD;  Location: [facility] INVASIVE CV LAB;  Service: Cardiovascular;  Laterality: N/A;  LEFT HEART CATH AND CORS/GRAFTS ANGIOGRAPHY N/A 06/19/2020   Procedure: LEFT HEART CATH AND CORS/GRAFTS ANGIOGRAPHY;  Surgeon: Burnard Debby LABOR, MD;  Location: [facility] INVASIVE CV LAB;  Service: Cardiovascular;  Laterality: N/A;   LEFT HEART CATH AND CORS/GRAFTS ANGIOGRAPHY N/A 03/08/2023   Procedure: LEFT HEART CATH AND CORS/GRAFTS ANGIOGRAPHY;  Surgeon: Darron Deatrice LABOR, MD;  Location: [facility] INVASIVE CV LAB;  Service: Cardiovascular;  Laterality: N/A;   POLYPECTOMY     RIGHT/LEFT HEART CATH AND CORONARY ANGIOGRAPHY N/A 04/16/2019   Procedure: RIGHT/LEFT HEART CATH AND CORONARY ANGIOGRAPHY;  Surgeon: Claudene Victory ORN, MD;  Location: [facility] INVASIVE  CV LAB;  Service: Cardiovascular;  Laterality: N/A;   SHOULDER SURGERY     Rt. Shoulder   TEE WITHOUT CARDIOVERSION N/A 04/26/2019   Procedure: TRANSESOPHAGEAL ECHOCARDIOGRAM (TEE);  Surgeon: Lucas Dorise POUR, MD;  Location: [facility] OR;  Service: Open Heart Surgery;  Laterality: N/A;   TONSILLECTOMY     V TACH ABLATION N/A 06/22/2020   Procedure: V TACH ABLATION;  Surgeon: Cindie Ole DASEN, MD;  Location: [facility] INVASIVE CV LAB;  Service: Cardiovascular;  Laterality: N/A;   Current Outpatient Medications on File Prior to Visit  Medication Sig Dispense Refill   acetaminophen  (TYLENOL ) 500 MG tablet Take 1,000 mg by mouth at bedtime as needed for mild pain.     aspirin  EC 81 MG tablet Take 1 tablet (81 mg total) by mouth daily. 90 tablet 3   azelastine  (ASTELIN ) 0.1 % nasal spray Place 2 sprays into both nostrils daily as needed for allergies or rhinitis. Use in each nostril as directed     Cetirizine  HCl (ZERVIATE ) 0.24 % SOLN Apply 1 drop to eye 2 (two) times daily. 30 each 3   Cholecalciferol (VITAMIN D -3 PO) Take 1 tablet by mouth daily.     cholestyramine  (QUESTRAN ) 4 g packet Take 0.5 packets (2 g total) by mouth 2 (two) times daily. 60 each 3   citalopram  (CELEXA ) 10 MG tablet Take 1 tablet (10 mg total) by mouth daily. 90 tablet 1   clotrimazole  (LOTRIMIN ) 1 % cream Apply 1 Application topically in the morning, at noon, and at bedtime. 30 g 1   clotrimazole  (LOTRIMIN ) 1 % cream Apply 1 Application topically 2 (two) times daily. 30 g 2   diphenoxylate -atropine  (LOMOTIL ) 2.5-0.025 MG tablet Take 2 tablets by mouth 4 (four) times daily as needed for diarrhea or loose stools. 30 tablet 0   empagliflozin  (JARDIANCE ) 25 MG TABS tablet TAKE 25 MG (1 TABLET) BY MOUTH DAILY BEFORE BREAKFAST. STOP PIOGLITAZONE  90 tablet 0   fexofenadine  (ALLEGRA ) 180 MG tablet Take 1 tablet (180 mg total) by mouth daily as needed. For allergies (Patient taking differently: Take 180 mg by mouth daily. For allergies) 90  tablet 0   finasteride  (PROSCAR ) 5 MG tablet TAKE 1 TABLET BY MOUTH EVERY DAY 90 tablet 3   hydrocortisone  (ANUSOL -HC) 2.5 % rectal cream Place 1 Application rectally daily as needed for hemorrhoids or anal itching. 30 g 1   MAGNESIUM  PO Take 1 tablet by mouth daily.     metoprolol  succinate (TOPROL  XL) 25 MG 24 hr tablet Take 1 tablet (25 mg total) by mouth daily. 30 tablet 11   montelukast  (SINGULAIR ) 10 MG tablet Take 1 tablet (10 mg total) by mouth at bedtime. 90 tablet 0   nitroGLYCERIN  (NITROSTAT ) 0.4 MG SL tablet Place 1 tablet (0.4 mg total) under the tongue every 5 (five) minutes x 3 doses as needed for chest pain. 25  tablet 12   olopatadine  (PATADAY ) 0.1 % ophthalmic solution Place 1 drop into both eyes daily as needed for allergies.     ondansetron  (ZOFRAN ) 4 MG tablet Take 1 tablet (4 mg total) by mouth every 8 (eight) hours as needed for nausea or vomiting. 31 tablet 0   pantoprazole  (PROTONIX ) 40 MG tablet TAKE 1 TABLET BY MOUTH TWICE A DAY 180 tablet 3   rosuvastatin  (CRESTOR ) 40 MG tablet Take 1 tablet (40 mg total) by mouth daily. 90 tablet 3   sitaGLIPtin  (JANUVIA ) 100 MG tablet Take 100 mg by mouth daily.     spironolactone  (ALDACTONE ) 25 MG tablet TAKE 1/2 TABLET BY MOUTH EVERY DAY 45 tablet 3   vitamin B-12 (CYANOCOBALAMIN ) 100 MCG tablet Take 100 mcg by mouth daily.     No current facility-administered medications on file prior to visit.   Allergies  Allergen Reactions   Doxazosin Mesylate Other (See Comments)    Drops in blood pressure    Losartan      Other reaction(s): Muscle pain   Testosterone      Other reaction(s): Erythrocytosis Other reaction(s): Erythrocytosis   Flomax  [Tamsulosin  Hcl] Itching and Other (See Comments)    Drops in blood pressure     Penicillins Rash and Other (See Comments)    Has patient had a PCN reaction causing immediate rash, facial/tongue/throat swelling, SOB or lightheadedness with hypotension: No Has patient had a PCN reaction  causing severe rash involving mucus membranes or skin necrosis: No Has patient had a PCN reaction that required hospitalization No Has patient had a PCN reaction occurring within the last 10 years: No If all of the above answers are NO, then may proceed with Cephalosporin use.    Sulfonamide Derivatives Rash   Trulicity  [Dulaglutide ] Nausea And Vomiting    N&V, Dizziness   Social History   Socioeconomic History   Marital status: Married    Spouse name: Not on file   Number of children: 1   Years of education: 12   Highest education level: High school graduate  Occupational History   Occupation: Therapist, sports- retired  Tobacco Use   Smoking status: Former    Current packs/day: 0.00    Types: Cigarettes    Quit date: 08/30/1983    Years since quitting: 40.5   Smokeless tobacco: Former   Tobacco comments:    Quit 30 years ago.  Vaping Use   Vaping status: Never Used  Substance and Sexual Activity   Alcohol use: Not Currently    Comment: rare   Drug use: No   Sexual activity: Yes  Other Topics Concern   Not on file  Social History Narrative   Not on file   Social Drivers of Health   Financial Resource Strain: Low Risk  (12/22/2022)   Overall Financial Resource Strain (CARDIA)    Difficulty of Paying Living Expenses: Not hard at all  Food Insecurity: No Food Insecurity (12/29/2023)   Hunger Vital Sign    Worried About Running Out of Food in the Last Year: Never true    Ran Out of Food in the Last Year: Never true  Transportation Needs: No Transportation Needs (12/29/2023)   PRAPARE - Administrator, Civil Service (Medical): No    Lack of Transportation (Non-Medical): No  Physical Activity: Insufficiently Active (12/22/2022)   Exercise Vital Sign    Days of Exercise per Week: 3 days    Minutes of Exercise per Session: 30 min  Stress: No Stress Concern Present (  12/22/2022)   [employer] [employer] - Occupational Stress Questionnaire    Feeling of  Stress : Not at all  Social Connections: Moderately Isolated (12/29/2023)   Social Connection and Isolation Panel    Frequency of Communication with Friends and Family: Once a week    Frequency of Social Gatherings with Friends and Family: Once a week    Attends Religious Services: Never    Database administrator or Organizations: No    Attends Engineer, structural: 1 to 4 times per year    Marital Status: Married  Catering manager Violence: Not At Risk (12/29/2023)   Humiliation, Afraid, Rape, and Kick questionnaire    Fear of Current or Ex-Partner: No    Emotionally Abused: No    Physically Abused: No    Sexually Abused: No     Review of Systems     Objective:   Physical Exam Vitals reviewed.  Constitutional:      General: He is not in acute distress.    Appearance: Normal appearance. He is obese. He is not ill-appearing or toxic-appearing.  Eyes:     General: Lids are normal.     Extraocular Movements:     Right eye: Normal extraocular motion.     Left eye: Normal extraocular motion.     Conjunctiva/sclera:     Left eye: Left conjunctiva is not injected.  Cardiovascular:     Rate and Rhythm: Normal rate and regular rhythm.     Pulses: Normal pulses.     Heart sounds: Normal heart sounds. No murmur heard.    No friction rub. No gallop.  Pulmonary:     Effort: Pulmonary effort is normal. No respiratory distress.     Breath sounds: Normal breath sounds. No stridor. No wheezing, rhonchi or rales.  Abdominal:     General: Abdomen is flat. Bowel sounds are normal. There is no distension.     Palpations: Abdomen is soft. There is no mass.     Tenderness: There is no abdominal tenderness. There is no guarding or rebound.     Hernia: No hernia is present.  Musculoskeletal:     Right lower leg: No edema.     Left lower leg: No edema.     Right foot: Normal capillary refill. No crepitus.     Left foot: Normal capillary refill. No crepitus.  Neurological:     General:  No focal deficit present.     Mental Status: He is alert and oriented to person, place, and time. Mental status is at baseline.  Psychiatric:        Mood and Affect: Mood normal.        Behavior: Behavior normal.        Thought Content: Thought content normal.        Judgment: Judgment normal.           Assessment & Plan:  Primary hyperparathyroidism (HCC) Patient has primary hyperparathyroidism.  Endocrinology has recommended follow-up in 6 months.  Patient is not currently taking a bisphosphonate.  Patient would like a second opinion.  We discussed his treatment options.  He could start a bisphosphonate and obtain a baseline bone density test.  However he continues to have multiple physiologic symptoms that potentially could be related to his hypercalcemia.  Patient would like to discuss surgical treatment options and decide between surgery versus bisphosphonate.

## 2024-03-20 NOTE — Progress Notes (Signed)
  Electrophysiology Office Note:   Date:  04/01/2024  ID:  Raeden, Schippers [birth year], MRN [id number]  Primary Cardiologist: Darryle ONEIDA Decent, MD Primary Heart Failure: Toribio Fuel, MD Electrophysiologist: OLE ONEIDA HOLTS, MD       History of Present Illness:   CORTLAN DOLIN is a 78 y.o. male with h/o HFrEF / ICM, VT s/p ICD, CHB, HTN, HLD, CAD s/p CABG, DM II seen today for routine electrophysiology follow-up s/p ICD generator change.   Since last being seen in our clinic the patient reports he has been experiencing dry skin and low blood pressures. He reduced his Toprol  to 12.5mg  as he was feeling dizzy / lightheaded and his BP at home was running 100-105 systolic.     He denies chest pain, palpitations, dyspnea, PND, orthopnea, nausea, vomiting, dizziness, syncope, edema, weight gain, or early satiety.    Review of systems complete and found to be negative unless listed in HPI.    EP Information / Studies Reviewed:    EKG is not ordered today. EKG from 12/29/23 reviewed which showed ASVP 68 bpm      ICD Interrogation-  reviewed in detail today,  See PACEART report.  Device History: Medtronic Single Chamber ICD implanted 05/30/20 for ICM / VT. Upgrade to CRT-D 12/28/23 with addition of RA, CS lead.  History of appropriate therapy: No History of AAD therapy: Yes; previously tolerated amiodarone  2021 while inpatient   Risk Assessment/Calculations:              Physical Exam:   VS:  BP 107/67   Pulse 68   Ht 5' 10 (1.778 m)   Wt 182 lb (82.6 kg)   SpO2 97%   BMI 26.11 kg/m    Wt Readings from Last 3 Encounters:  04/01/24 182 lb (82.6 kg)  03/18/24 181 lb (82.1 kg)  02/22/24 183 lb (83 kg)     GEN: Well nourished, well developed in no acute distress NECK: No JVD; No carotid bruits CARDIAC: Regular rate and rhythm, no murmurs, rubs, gallops, ICD site well healed, no hematoma or ecchymosis   RESPIRATORY:  Clear to auscultation without rales, wheezing or  rhonchi  ABDOMEN: Soft, non-tender, non-distended EXTREMITIES:  No edema; No deformity   ASSESSMENT AND PLAN:    Chronic Systolic Dysfunction s/p Medtronic CRT-D  -euvolemic on exam / by device  -99.5% BiV pacing, upright in V1 on EKG, QRS (LV>RV by 40 ms on device) -Stable on an appropriate medical regimen -Normal ICD function -See Pace Art report -No changes today -previously taken off ACE/ARB due to AKI, repeat labs in June with normal Cr. His BP precludes addition at this time -pt self reduced Toprol  to 12.5 mg due to dizziness / low BP   Disposition:   Follow up with Dr. HOLTS in 12 months   Signed, Daphne Barrack, NP-C, AGACNP-BC [facility] - Electrophysiology  04/01/2024, 2:20 PM

## 2024-03-25 DIAGNOSIS — E119 Type 2 diabetes mellitus without complications: Secondary | ICD-10-CM | POA: Diagnosis not present

## 2024-04-01 ENCOUNTER — Ambulatory Visit: Attending: Pulmonary Disease | Admitting: Pulmonary Disease

## 2024-04-01 ENCOUNTER — Encounter: Payer: Self-pay | Admitting: Pulmonary Disease

## 2024-04-01 ENCOUNTER — Ambulatory Visit: Payer: Self-pay | Admitting: Cardiology

## 2024-04-01 ENCOUNTER — Ambulatory Visit

## 2024-04-01 VITALS — BP 107/67 | HR 68 | Ht 70.0 in | Wt 182.0 lb

## 2024-04-01 DIAGNOSIS — Z95810 Presence of automatic (implantable) cardiac defibrillator: Secondary | ICD-10-CM

## 2024-04-01 DIAGNOSIS — I5022 Chronic systolic (congestive) heart failure: Secondary | ICD-10-CM

## 2024-04-01 DIAGNOSIS — I472 Ventricular tachycardia, unspecified: Secondary | ICD-10-CM | POA: Diagnosis not present

## 2024-04-01 LAB — CUP PACEART REMOTE DEVICE CHECK
Battery Remaining Longevity: 100 mo
Battery Voltage: 3.03 V
Brady Statistic RV Percent Paced: 99.62 %
Date Time Interrogation Session: 20250804020754
HighPow Impedance: 60 Ohm
Implantable Lead Connection Status: 753985
Implantable Lead Connection Status: 753985
Implantable Lead Connection Status: 753985
Implantable Lead Implant Date: 20211028
Implantable Lead Implant Date: 20250501
Implantable Lead Implant Date: 20250501
Implantable Lead Location: 753858
Implantable Lead Location: 753859
Implantable Lead Location: 753860
Implantable Lead Model: 4598
Implantable Lead Model: 5076
Implantable Pulse Generator Implant Date: 20250501
Lead Channel Impedance Value: 361 Ohm
Lead Channel Impedance Value: 380 Ohm
Lead Channel Impedance Value: 418 Ohm
Lead Channel Impedance Value: 456 Ohm
Lead Channel Impedance Value: 456 Ohm
Lead Channel Impedance Value: 494 Ohm
Lead Channel Impedance Value: 570 Ohm
Lead Channel Impedance Value: 646 Ohm
Lead Channel Impedance Value: 760 Ohm
Lead Channel Impedance Value: 779 Ohm
Lead Channel Impedance Value: 817 Ohm
Lead Channel Impedance Value: 855 Ohm
Lead Channel Impedance Value: 912 Ohm
Lead Channel Pacing Threshold Amplitude: 0.625 V
Lead Channel Pacing Threshold Amplitude: 0.625 V
Lead Channel Pacing Threshold Amplitude: 1 V
Lead Channel Pacing Threshold Pulse Width: 0.4 ms
Lead Channel Pacing Threshold Pulse Width: 0.4 ms
Lead Channel Pacing Threshold Pulse Width: 0.8 ms
Lead Channel Sensing Intrinsic Amplitude: 1.8 mV
Lead Channel Setting Pacing Amplitude: 1.5 V
Lead Channel Setting Pacing Amplitude: 2 V
Lead Channel Setting Pacing Amplitude: 2 V
Lead Channel Setting Pacing Pulse Width: 0.4 ms
Lead Channel Setting Pacing Pulse Width: 0.8 ms
Lead Channel Setting Sensing Sensitivity: 0.3 mV
Zone Setting Status: 755011
Zone Setting Status: 755011

## 2024-04-01 LAB — CUP PACEART INCLINIC DEVICE CHECK
Date Time Interrogation Session: 20250804141634
Implantable Lead Connection Status: 753985
Implantable Lead Connection Status: 753985
Implantable Lead Connection Status: 753985
Implantable Lead Implant Date: 20211028
Implantable Lead Implant Date: 20250501
Implantable Lead Implant Date: 20250501
Implantable Lead Location: 753858
Implantable Lead Location: 753859
Implantable Lead Location: 753860
Implantable Lead Model: 4598
Implantable Lead Model: 5076
Implantable Pulse Generator Implant Date: 20250501

## 2024-04-01 MED ORDER — METOPROLOL SUCCINATE ER 25 MG PO TB24: 12.5000 mg | ORAL_TABLET | Freq: Every day | ORAL | Status: DC

## 2024-04-01 NOTE — Patient Instructions (Signed)
 Medication Instructions:  Your physician recommends that you continue on your current medications as directed. Please refer to the Current Medication list given to you today.  *If you need a refill on your cardiac medications before your next appointment, please call your pharmacy*  Lab Work: None ordered If you have labs (blood work) drawn today and your tests are completely normal, you will receive your results only by: MyChart Message (if you have MyChart) OR A paper copy in the mail If you have any lab test that is abnormal or we need to change your treatment, we will call you to review the results.  Follow-Up: At [facility], you and your health needs are our priority.  As part of our continuing mission to provide you with exceptional heart care, our providers are all part of one team.  This team includes your primary Cardiologist (physician) and Advanced Practice Providers or APPs (Physician Assistants and Nurse Practitioners) who all work together to provide you with the care you need, when you need it.  Your next appointment:   1 year(s)  Provider:   Ole Holts, MD or Daphne Barrack, NP    We recommend signing up for the patient portal called MyChart.  Sign up information is provided on this After Visit Summary.  MyChart is used to connect with patients for Virtual Visits (Telemedicine).  Patients are able to view lab/test results, encounter notes, upcoming appointments, etc.  Non-urgent messages can be sent to your provider as well.   To learn more about what you can do with MyChart, go to [email or web address].

## 2024-04-03 ENCOUNTER — Ambulatory Visit: Payer: Self-pay | Admitting: Cardiology

## 2024-04-10 ENCOUNTER — Other Ambulatory Visit (HOSPITAL_COMMUNITY): Payer: Self-pay | Admitting: Surgery

## 2024-04-10 ENCOUNTER — Telehealth: Payer: Self-pay

## 2024-04-10 DIAGNOSIS — E0590 Thyrotoxicosis, unspecified without thyrotoxic crisis or storm: Secondary | ICD-10-CM

## 2024-04-10 DIAGNOSIS — E210 Primary hyperparathyroidism: Secondary | ICD-10-CM | POA: Diagnosis not present

## 2024-04-10 NOTE — Telephone Encounter (Signed)
   Pre-operative Risk Assessment    [name]  DOB: [birth year] MRN: [id number]   Date of last office visit: 04/01/24 DAPHNE BARRACK, NP Date of next office visit: NONE   Request for Surgical Clearance    Procedure:  PARATHYROID  SURGERY  Date of Surgery:  Clearance TBD                                Surgeon:  KRYSTAL SPINNER, MD Surgeon's Group or Practice Name:  [facility] Phone number:  [phone] Fax number:  [phone]  ATTN: BURNARD CRETE, LPN   Type of Clearance Requested:   - Medical  - Pharmacy:  Hold Aspirin      Type of Anesthesia:  General    Additional requests/questions:    SignedLucie DELENA Ku   04/10/2024, 5:26 PM

## 2024-04-15 NOTE — Telephone Encounter (Signed)
 Spoke with the patient to schedule OV for preop clearance, patient states that he is unsure as to if and when he will have the procedure. States he has a few test that will be done on Friday and will determine whether he will have the procedure. Patient states that he will give our office a call back to inform us  and to schedule his clearance appt.

## 2024-04-15 NOTE — Telephone Encounter (Signed)
   Name: Cameron Daniel  DOB: 09/18/1945  MRN: [id number]  Primary Cardiologist: Darryle ONEIDA Decent, MD  Chart reviewed as part of pre-operative protocol coverage. Because of [name]'s past medical history and time since last visit, he will require a follow-up in-office visit in order to better assess preoperative cardiovascular risk.  Pre-op covering staff: - Please schedule appointment and call patient to inform them. If patient already had an upcoming appointment within acceptable timeframe, please add pre-op clearance to the appointment notes so provider is aware. - Please contact requesting surgeon's office via preferred method (i.e, phone, fax) to inform them of need for appointment prior to surgery.  Ideally aspirin  should be continued without interruption, however if the bleeding risk is too great, aspirin  may be held for 5-7 days prior to surgery. Please resume aspirin  post operatively when it is felt to be safe from a bleeding standpoint.     Lum LITTIE Louis, NP  04/15/2024, 4:17 PM

## 2024-04-16 NOTE — Telephone Encounter (Signed)
 Patient returned Pre-op call and noted patient will have an ultrasound and CT tests scheduled on 8/22.

## 2024-04-16 NOTE — Telephone Encounter (Signed)
 I called the pt back and informed the pt that he will need in office appt still due to low BP and dizziness. Pt is agreeable to plan of care. Pt has been scheduled to see Lum Louis, NP 04/30/24 @ 1:55.   I will update all parties involved.

## 2024-04-16 NOTE — Telephone Encounter (Signed)
 Pt said he has US  and CT 04/19/24. Pt said he is not sure when his surgery be as they have to get the results in from US  and CT. Pt said he just saw Daphne Barrack, NP 04/01/24 and was not sure if she might be able to clear him, however he said he will do what he needs to do. I stated to the pt to let me see what I can find out about appt and I will call him back later. Pt asked if I could call his cell# [phone]. Pt said he will wait by the phone for my call.

## 2024-04-19 ENCOUNTER — Encounter (HOSPITAL_COMMUNITY)
Admission: RE | Admit: 2024-04-19 | Discharge: 2024-04-19 | Disposition: A | Discharge status: Home / Self Care | Source: Ambulatory Visit | Attending: Surgery | Admitting: Surgery

## 2024-04-19 ENCOUNTER — Encounter (HOSPITAL_COMMUNITY): Payer: Self-pay

## 2024-04-19 DIAGNOSIS — E210 Primary hyperparathyroidism: Secondary | ICD-10-CM | POA: Insufficient documentation

## 2024-04-19 DIAGNOSIS — E0590 Thyrotoxicosis, unspecified without thyrotoxic crisis or storm: Secondary | ICD-10-CM | POA: Diagnosis not present

## 2024-04-19 DIAGNOSIS — E213 Hyperparathyroidism, unspecified: Secondary | ICD-10-CM | POA: Diagnosis not present

## 2024-04-19 MED ORDER — TECHNETIUM TC 99M SESTAMIBI - CARDIOLITE: 25.0000 | Freq: Once | INTRAVENOUS | Status: DC

## 2024-04-19 MED ORDER — TECHNETIUM TC 99M SESTAMIBI - CARDIOLITE
25.0000 | Freq: Once | INTRAVENOUS | Status: AC
  Administered 2024-04-19: 25 via INTRAVENOUS

## 2024-04-23 ENCOUNTER — Other Ambulatory Visit: Payer: Self-pay | Admitting: Family Medicine

## 2024-04-23 ENCOUNTER — Other Ambulatory Visit: Payer: Self-pay

## 2024-04-23 MED ORDER — MONTELUKAST SODIUM 10 MG PO TABS: 10.0000 mg | ORAL_TABLET | Freq: Every day | ORAL | 0 refills | Status: DC

## 2024-04-25 ENCOUNTER — Ambulatory Visit: Payer: Self-pay | Admitting: Surgery

## 2024-04-25 NOTE — Progress Notes (Signed)
 No significant thyroid  abnormality.  Parathyroid  adenoma not identified.  Will await results of sestamibi scan.  Krystal Spinner, MD [facility] A [facility] practice Office: [phone]

## 2024-04-30 ENCOUNTER — Encounter: Payer: Self-pay | Admitting: Emergency Medicine

## 2024-04-30 ENCOUNTER — Ambulatory Visit: Attending: Emergency Medicine | Admitting: Emergency Medicine

## 2024-04-30 ENCOUNTER — Ambulatory Visit: Payer: Self-pay | Admitting: Surgery

## 2024-04-30 VITALS — BP 102/66 | HR 63 | Ht 70.0 in | Wt 178.0 lb

## 2024-04-30 DIAGNOSIS — I25119 Atherosclerotic heart disease of native coronary artery with unspecified angina pectoris: Secondary | ICD-10-CM | POA: Diagnosis not present

## 2024-04-30 DIAGNOSIS — E782 Mixed hyperlipidemia: Secondary | ICD-10-CM | POA: Diagnosis not present

## 2024-04-30 DIAGNOSIS — I350 Nonrheumatic aortic (valve) stenosis: Secondary | ICD-10-CM

## 2024-04-30 DIAGNOSIS — I472 Ventricular tachycardia, unspecified: Secondary | ICD-10-CM | POA: Diagnosis not present

## 2024-04-30 DIAGNOSIS — Z01810 Encounter for preprocedural cardiovascular examination: Secondary | ICD-10-CM | POA: Diagnosis not present

## 2024-04-30 DIAGNOSIS — Z95810 Presence of automatic (implantable) cardiac defibrillator: Secondary | ICD-10-CM | POA: Diagnosis not present

## 2024-04-30 DIAGNOSIS — I5022 Chronic systolic (congestive) heart failure: Secondary | ICD-10-CM

## 2024-04-30 NOTE — Progress Notes (Addendum)
 Cardiology Office Note:    Date:  04/30/2024  ID:  Cameron Daniel, Cameron Daniel Nov 17, 1945, MRN [id number] PCP: Duanne Butler DASEN, MD  [facility] Providers Cardiologist:  Darryle DASEN Decent, MD Cardiology APP:  Rana Lum CROME, NP  Electrophysiologist:  OLE DASEN HOLTS, MD  Advanced Heart Failure:  Toribio Fuel, MD       Patient Profile:       Chief Complaint: Preoperative clearance History of Present Illness:  Cameron Daniel is a 78 y.o. male with visit-pertinent history of coronary artery disease s/p CABG (LIMA-LAD, SVG-OM, SVG-dRCA) on 03/2019, systolic heart failure, ventricular tachycardia s/p ablation with ICD implantation, hypertension, hyperlipidemia, T2DM  Patient's history includes MI 1985 with unclear intervention. Aortoiliac duplex 2014 showed no AAA.  Did well for many years before presenting to the ED in August 2020 with fever and malaise.  Was seen in the ER and given fluids and discharged but returned back with respiratory failure requiring intubation for pneumonia and CHF and NSTEMI.  Echocardiogram 03/2019 showed LVEF 25 to 30%, severe hypokinesis of the left ventricle, entire inferior wall and anterior septal wall, mild LAE and small pericardial effusion.  Cardiac cath showed three-vessel CAD with low output.  He was started on milrinone  post cath.  Developed large spontaneous retroperitoneal bleed post cath (cath was done all from the arm).  Ultimately underwent CABG on 04/26/2019.  Preop TEE with LVEF 45 to 50%, post CABG was uneventful.  Spironolactone  was later stopped due to hyperkalemia and olmesartan  dropped due to hypotension.  He was intolerant to losartan  as well.  On 05/2020 was admitted with persistent VT.  Initially treated with amiodarone  but ultimately converted in the ED.  LHC showed patent grafts with without specific culprit.  Was seen by EP and underwent EPS/ablation with scar modification and ICD implantation.  Echocardiogram 10/21 showed LVEF 45 to  50%, mild LVH, grade 2 DD, moderate LAE, normal RV.  TTE 07/2022 with LVEF 40 to 45%, global hypokinesis, grade 3 DD, severe RV dysfunction with moderate RV enlargement and mild MR.  He was seen in May 2024 and having increased fatigue with exertion.  Cardiac PET/CT on 01/2023 showed a large severe defect in the basilar to apical inferior and inferolateral locations that was particularly reversible.  Rest EF 25%, stress EF 35%.  He underwent cardiac catheterization on 02/2023 showing severe underlying three-vessel coronary artery disease with patent grafts including LIMA to LAD, SVG to OM 3 and SVG to right PDA.  Medical therapy was recommended.  He underwent echocardiogram on 05/2023 showing LVEF 35 to 40%, basal to mid inferolateral akinesis with severe inferior hypokinesis, moderate LVH, grade 2 DD, RV function mildly reduced, normal PASP, biatrial size mildly dilated, trivial MR, moderate MAC, severe calcification of aortic valve, moderate low flow/low gradient aortic valve stenosis with mean gradient 9 mmHg but AVA by continuity equation is about 1, visually at least moderate AS.  He was last seen in office on 04/01/2024 by EP.  Patient reported he had been experiencing some low blood pressures he reduced his Toprol  to 12.5 mg as he was feeling dizzy and lightheaded.  In clinic patient's blood pressure was well-controlled he noted improvement in symptoms.  Patient is now pending parathyroid  surgery on date TBD with [facility].   Discussed the use of AI scribe software for clinical note transcription with the patient, who gave verbal consent to proceed.  History of Present Illness Cameron Daniel is a 78 year  old male with hypertension and coronary artery disease who presents with dizziness and and preoperative clearance for parathyroid  surgery.   Today patient is doing well overall.  He is without any acute cardiovascular concerns or complaints today.  He reports his  dizziness/lightheadedness has improved since reducing his metoprolol  dose to 12.5 mg.  He only experiences dizziness when he stands up too quickly or turns too quickly.  He denies chest pain, shortness of breath, syncope, presyncope, or leg swelling.  He does continue to remain cautious while standing.  He denies any falls.  He is on medications including Jardiance , metoprolol , and spironolactone  for his heart condition.  He remains compliant with current medication regimen.  He does take his blood pressures at home and notes similar readings to an office reading today.  He denies any leg swelling as that 1 leg is a little larger than the other due to a past injury.  No orthopnea, PND, dyspnea.  He experiences frequent urination at night, approximately four to five times, and is on Proscar  for this condition. He reports a loss of appetite, constipation, decreased interest in activities like bass fishing, poor balance, and deteriorating hearing.   Review of systems:  Please see the history of present illness. All other systems are reviewed and otherwise negative.      Studies Reviewed:        Cardiac catheterization 03/08/2023   Ost LAD lesion is 80% stenosed.   Prox LAD to Mid LAD lesion is 90% stenosed.   Mid LAD lesion is 95% stenosed.   Mid Cx to Dist Cx lesion is 95% stenosed.   Mid RCA lesion is 85% stenosed.   Prox Graft lesion is 20% stenosed.   Prox Cx to Mid Cx lesion is 100% stenosed.   Mid LM to Dist LM lesion is 50% stenosed.   LV end diastolic pressure is mildly elevated.   The left ventricular ejection fraction is 25-35% by visual estimate.   1.  Severe underlying three-vessel coronary artery disease with patent grafts including LIMA to LAD, SVG to OM 3 and SVG to right PDA. 2.  Moderately to severely reduced LV systolic function with an EF of 30% and mildly elevated left ventricular end-diastolic pressure at 20 mmHg. Diagnostic Dominance: Right   Echocardiogram  06/19/2023  1. Left ventricular ejection fraction, by estimation, is 35 to 40%. The  left ventricle has moderately decreased function. The left ventricle  demonstrates regional wall motion abnormalities with basal to mid  inferolateral akinesis and severe inferior  hypokinesis. There is moderate concentric left ventricular hypertrophy.  Left ventricular diastolic parameters are consistent with Grade II  diastolic dysfunction (pseudonormalization).   2. Right ventricular systolic function is mildly reduced. The right  ventricular size is normal. There is normal pulmonary artery systolic  pressure. The estimated right ventricular systolic pressure is 23.5 mmHg.   3. Left atrial size was mildly dilated.   4. Right atrial size was mildly dilated.   5. The mitral valve is degenerative. Trivial mitral valve regurgitation.  Moderate mitral annular calcification.   6. The aortic valve is tricuspid. There is severe calcifcation of the  aortic valve. Aortic valve regurgitation is not visualized. Probably  moderate low flow/low gradient aortic valve stenosis. Mean gradient 9 mmHg  but AVA by continuity equation is about   1 cm^2. Visually, at least moderate AS.   7. The inferior vena cava is normal in size with <50% respiratory  variability, suggesting right atrial pressure of 8  mmHg.   Echocardiogram 08/10/2022  1. Left ventricular ejection fraction, by estimation, is 40 to 45%. The  left ventricle has mildly decreased function. The left ventricle  demonstrates global hypokinesis. There is mild concentric left ventricular  hypertrophy. Left ventricular diastolic  parameters are consistent with Grade III diastolic dysfunction  (restrictive). Elevated left ventricular end-diastolic pressure.   2. Right ventricular systolic function is severely reduced. The right  ventricular size is moderately enlarged. There is normal pulmonary artery  systolic pressure. The estimated right ventricular systolic  pressure is  27.7 mmHg.   3. Left atrial size was moderately dilated.   4. The mitral valve is degenerative. Mild mitral valve regurgitation. No  evidence of mitral stenosis. Moderate mitral annular calcification.   5. The aortic valve is tricuspid. There is severe calcifcation of the  aortic valve. There is moderate thickening of the aortic valve. Aortic  valve regurgitation is not visualized. No aortic stenosis is present.   6. The inferior vena cava is normal in size with <50% respiratory  variability, suggesting right atrial pressure of 8 mmHg.  Risk Assessment/Calculations:              Physical Exam:   VS:  BP 102/66   Pulse 63   Ht 5' 10 (1.778 m)   Wt 178 lb (80.7 kg)   SpO2 99%   BMI 25.54 kg/m    Wt Readings from Last 3 Encounters:  04/30/24 178 lb (80.7 kg)  04/01/24 182 lb (82.6 kg)  03/18/24 181 lb (82.1 kg)    GEN: Well nourished, well developed in no acute distress NECK: No JVD; No carotid bruits CARDIAC: RRR.  3/6 systolic murmur, no rubs, gallops RESPIRATORY:  Clear to auscultation without rales, wheezing or rhonchi  ABDOMEN: Soft, non-tender, non-distended EXTREMITIES:  No edema; No acute deformity      Assessment and Plan:  Coronary artery disease s/p CABG (LIMA-LAD, SVG-OM3, SVG-dRCA) on 03/2019 Most recent cardiac catheterization on 02/2023 showed patent grafts and medical therapy was recommended - Today patient is without anginal symptoms.  He remains fairly active without exertional chest pains or limitations.  There is no indication for further ischemic evaluation or antianginal therapy at this time - Continue aspirin  81 mg daily, metoprolol  XL 12.5 mg daily, rosuvastatin  40 mg daily, and nitroglycerin  as needed  Systolic heart failure Echocardiogram 05/2023 with LVEF 35 to 40%, moderate LVH, grade 2 DD, RV mildly reduced Echocardiogram 07/2022 with LVEF 40 to 45% S/p [employer] CRT-D NYHA class II - Today patient appears euvolemic and well  compensated on exam.  Volume status is stable without dyspnea, orthopnea, PND, leg swelling - GDMT titration limited given marginal blood pressure.  He also has occasional dizziness upon standing likely due to orthostatic hypotension - Much education given on standing up slowly to prevent dizziness and adequate hydration - Continue Jardiance  25 mg daily, metoprolol  XL 12.5 mg daily, spironolactone  12.5 mg daily  Low-flow/low gradient moderate aortic valve stenosis Echocardiogram 05/2023 showed severe calcification of the aortic valve with moderate low-flow/low gradient aortic valve stenosis with mean gradient of 9 mmHg but AVA continuity equation is about 1 cm, visually at least moderate AS - Today he is without chest pains, dyspnea, or syncope - Plan to repeat echocardiogram for routine monitoring  Ventricular tachycardia S/p VT ablation and ICD implantation - No recurrence of arrhythmias - Follow-up with EP as scheduled  Hypertension Blood pressure well-controlled at 102/66 - He does have intermittent hypotension likely secondary to his  HF GDMT.  Will experience occasional dizziness when he stands or turns quickly - Education given on ways to prevent orthostatic hypotension.  Educated on adequate hydration.  Denies any falls, syncope or presyncope - Continue HF GDMT  Hyperlipidemia LDL 35 on 11/2023 and well-controlled - Continue rosuvastatin  40 mg daily  Preoperative cardiovascular clearance Pending parathyroid  surgery on date TBD with [facility]  According to the Revised Cardiac Risk Index (RCRI), his Perioperative Risk of Major Cardiac Event is (%): 6.6. His Functional Capacity in METs is: 5.62 according to the [facility] Activity Status Index (DASI). Therefore, based on ACC/AHA guidelines, patient would be at acceptable risk for the planned procedure without further cardiovascular testing. I will route this recommendation to the requesting party via Epic fax  function.  Ideally aspirin  should be continued without interruption, however if the bleeding risk is too great, aspirin  may be held for 5-7 days prior to surgery. Please resume aspirin  post operatively when it is felt to be safe from a bleeding standpoint.     Addendum Patient's echocardiogram on 06/04/2024 now shows severe aortic stenosis with EF of 25 to 30%.  He will need to be evaluated by our structural heart team for TAVR prior to clearance for parathyroidectomy.      Dispo:  Return in about 6 months (around 10/28/2024).  Signed, Lum LITTIE Louis, NP

## 2024-04-30 NOTE — Progress Notes (Signed)
 Nuclear med scan and USN are both negative for parathyroid  adenoma.   Will proceed with 4D-CT scan as we discussed in the office.   [facility] - please arrange 4D-CT scan of neck with parathyroid  protocol.  I will contact patient with results when they are available.   Krystal Spinner, MD [facility] practice Office: [phone]

## 2024-04-30 NOTE — Patient Instructions (Addendum)
 Medication Instructions:  NO CHANGES  Lab Work: NONE TO BE DONE TODAY.  Testing/Procedures: Your physician has requested that you have an echocardiogram. Echocardiography is a painless test that uses sound waves to create images of your heart. It provides your doctor with information about the size and shape of your heart and how well your heart's chambers and valves are working. This procedure takes approximately one hour. There are no restrictions for this procedure. Please do NOT wear cologne, perfume, aftershave, or lotions (deodorant is allowed). Please arrive 15 minutes prior to your appointment time.  Please note: We ask at that you not bring children with you during ultrasound (echo/ vascular) testing. Due to room size and safety concerns, children are not allowed in the ultrasound rooms during exams. Our front office staff cannot provide observation of children in our lobby area while testing is being conducted. An adult accompanying a patient to their appointment will only be allowed in the ultrasound room at the discretion of the ultrasound technician under special circumstances. We apologize for any inconvenience. .  Follow-Up: At [facility], you and your health needs are our priority.  As part of our continuing mission to provide you with exceptional heart care, our providers are all part of one team.  This team includes your primary Cardiologist (physician) and Advanced Practice Providers or APPs (Physician Assistants and Nurse Practitioners) who all work together to provide you with the care you need, when you need it.  Your next appointment:   6 MONTHS  Provider:   Darryle ONEIDA Decent, MD OR Lum Louis, NP  We recommend signing up for the patient portal called MyChart.  Sign up information is provided on this After Visit Summary.  MyChart is used to connect with patients for Virtual Visits (Telemedicine).  Patients are able to view lab/test results, encounter  notes, upcoming appointments, etc.  Non-urgent messages can be sent to your provider as well.   To learn more about what you can do with MyChart, go to [email or web address].

## 2024-05-01 ENCOUNTER — Other Ambulatory Visit: Payer: Self-pay | Admitting: Surgery

## 2024-05-01 DIAGNOSIS — E213 Hyperparathyroidism, unspecified: Secondary | ICD-10-CM

## 2024-05-01 DIAGNOSIS — E210 Primary hyperparathyroidism: Secondary | ICD-10-CM

## 2024-05-07 ENCOUNTER — Ambulatory Visit
Admission: RE | Admit: 2024-05-07 | Discharge: 2024-05-07 | Disposition: A | Discharge status: Home / Self Care | Source: Ambulatory Visit | Attending: Surgery | Admitting: Surgery

## 2024-05-07 DIAGNOSIS — E213 Hyperparathyroidism, unspecified: Secondary | ICD-10-CM

## 2024-05-07 DIAGNOSIS — E210 Primary hyperparathyroidism: Secondary | ICD-10-CM

## 2024-05-07 MED ORDER — IOPAMIDOL (ISOVUE-300) INJECTION 61%
75.0000 mL | Freq: Once | INTRAVENOUS | Status: AC | PRN
  Administered 2024-05-07: 75 mL via INTRAVENOUS

## 2024-05-15 ENCOUNTER — Ambulatory Visit

## 2024-05-22 ENCOUNTER — Ambulatory Visit: Payer: Self-pay | Admitting: Surgery

## 2024-05-22 NOTE — Progress Notes (Signed)
 CT scan shows a 1.5 cm nodule on the right side suspicious for a parathyroid  adenoma.  Will plan to proceed with neck exploration and parathyroidectomy as an outpatient procedure as discussed in the office.  Will enter orders and send to schedulers to contact the patient.  Krystal Spinner, MD [facility] practice Office: [phone]

## 2024-05-27 NOTE — Progress Notes (Signed)
 Remote ICD Transmission

## 2024-05-28 ENCOUNTER — Encounter: Payer: Self-pay | Admitting: Family Medicine

## 2024-05-28 ENCOUNTER — Ambulatory Visit: Admitting: Family Medicine

## 2024-05-28 ENCOUNTER — Other Ambulatory Visit: Payer: Self-pay | Admitting: Family Medicine

## 2024-05-28 VITALS — BP 112/68 | HR 72 | Temp 97.6°F | Ht 70.0 in | Wt 184.2 lb

## 2024-05-28 DIAGNOSIS — E210 Primary hyperparathyroidism: Secondary | ICD-10-CM

## 2024-05-28 DIAGNOSIS — Z23 Encounter for immunization: Secondary | ICD-10-CM

## 2024-05-28 MED ORDER — METOPROLOL SUCCINATE ER 25 MG PO TB24: 12.5000 mg | ORAL_TABLET | Freq: Every day | ORAL | 3 refills | Status: AC

## 2024-05-28 MED ORDER — HYDROCORTISONE ACETATE 2.5 % EX CREA: 1.0000 | TOPICAL_CREAM | Freq: Two times a day (BID) | CUTANEOUS | 3 refills | Status: DC

## 2024-05-28 NOTE — Progress Notes (Signed)
 Subjective:    Patient ID: Cameron Daniel, male    DOB: Apr 15, 1946, 78 y.o.   MRN: [id number]  Patient has a history of primary hyperparathyroidism.  Patient has seen general surgery.  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  Patient is scheduled for surgical excision to treat hyperparathyroidism.  Continues to deal with constipation and muscle weakness which she attributes to the hyperparathyroidism.  He simply wants to discuss his treatment options today and get his flu shot.  Past Surgical History:  Procedure Laterality Date   ADENOIDECTOMY     CARPAL TUNNEL RELEASE     left    CHOLECYSTECTOMY     COLON RESECTION     18 inches   COLON SURGERY  2013   CORONARY ANGIOPLASTY     1985    CORONARY ARTERY BYPASS GRAFT N/A 04/26/2019   Procedure: CORONARY ARTERY BYPASS GRAFTING (CABG) x Three, using left internal mammary artery and right leg greater saphenous vein harvested endoscopically;  Surgeon: Lucas Dorise POUR, MD;  Location: [facility] OR;  Service: Open Heart Surgery;  Laterality: N/A;   ELBOW BURSA SURGERY     EYE SURGERY     ICD GENERATOR CHANGEOUT N/A 12/28/2023   Procedure: ICD GENERATOR CHANGEOUT;  Surgeon: Cindie Ole DASEN, MD;  Location: [facility] INVASIVE CV LAB;  Service: Cardiovascular;  Laterality: N/A;   ICD IMPLANT N/A 06/25/2020   Procedure: ICD IMPLANT;  Surgeon: Cindie Ole DASEN, MD;  Location: [facility] INVASIVE CV LAB;  Service: Cardiovascular;  Laterality: N/A;   LEAD INSERTION N/A 12/28/2023   Procedure: LEAD INSERTION;  Surgeon: Cindie Ole DASEN, MD;  Location: [facility] INVASIVE CV LAB;  Service: Cardiovascular;  Laterality: N/A;   LEFT HEART CATH AND CORS/GRAFTS ANGIOGRAPHY N/A 06/19/2020   Procedure: LEFT HEART CATH AND CORS/GRAFTS ANGIOGRAPHY;  Surgeon: Burnard Debby LABOR, MD;  Location: [facility] INVASIVE CV LAB;  Service: Cardiovascular;  Laterality: N/A;   LEFT HEART CATH AND CORS/GRAFTS ANGIOGRAPHY N/A 03/08/2023   Procedure: LEFT HEART CATH AND CORS/GRAFTS  ANGIOGRAPHY;  Surgeon: Darron Deatrice LABOR, MD;  Location: [facility] INVASIVE CV LAB;  Service: Cardiovascular;  Laterality: N/A;   POLYPECTOMY     RIGHT/LEFT HEART CATH AND CORONARY ANGIOGRAPHY N/A 04/16/2019   Procedure: RIGHT/LEFT HEART CATH AND CORONARY ANGIOGRAPHY;  Surgeon: Claudene Victory LELON, MD;  Location: [facility] INVASIVE CV LAB;  Service: Cardiovascular;  Laterality: N/A;   SHOULDER SURGERY     Rt. Shoulder   TEE WITHOUT CARDIOVERSION N/A 04/26/2019   Procedure: TRANSESOPHAGEAL ECHOCARDIOGRAM (TEE);  Surgeon: Lucas Dorise POUR, MD;  Location: [facility] OR;  Service: Open Heart Surgery;  Laterality: N/A;   TONSILLECTOMY     V TACH ABLATION N/A 06/22/2020   Procedure: V TACH ABLATION;  Surgeon: Cindie Ole DASEN, MD;  Location: [facility] INVASIVE CV LAB;  Service: Cardiovascular;  Laterality: N/A;   Current Outpatient Medications on File Prior to Visit  Medication Sig Dispense Refill   acetaminophen  (TYLENOL ) 500 MG tablet Take 1,000 mg by mouth at bedtime as needed for mild pain.     aspirin  EC 81 MG tablet Take 1 tablet (81 mg total) by mouth daily. 90 tablet 3   azelastine  (ASTELIN ) 0.1 % nasal spray Place 2 sprays into both nostrils daily as needed for allergies or rhinitis. Use in each nostril as directed     Cetirizine  HCl (ZERVIATE ) 0.24 % SOLN Apply 1 drop to eye 2 (two) times daily. 30 each 3   Cholecalciferol (VITAMIN D -3 PO) Take  1 tablet by mouth daily.     cholestyramine  (QUESTRAN ) 4 g packet Take 0.5 packets (2 g total) by mouth 2 (two) times daily. 60 each 3   citalopram  (CELEXA ) 10 MG tablet Take 1 tablet (10 mg total) by mouth daily. 90 tablet 1   clotrimazole  (LOTRIMIN ) 1 % cream Apply 1 Application topically in the morning, at noon, and at bedtime. 30 g 1   clotrimazole  (LOTRIMIN ) 1 % cream Apply 1 Application topically 2 (two) times daily. 30 g 2   diphenoxylate -atropine  (LOMOTIL ) 2.5-0.025 MG tablet Take 2 tablets by mouth 4 (four) times daily as needed for diarrhea or loose stools. 30 tablet 0    empagliflozin  (JARDIANCE ) 25 MG TABS tablet TAKE 25 MG (1 TABLET) BY MOUTH DAILY BEFORE BREAKFAST. STOP PIOGLITAZONE  90 tablet 0   fexofenadine  (ALLEGRA ) 180 MG tablet Take 1 tablet (180 mg total) by mouth daily as needed. For allergies (Patient taking differently: Take 180 mg by mouth daily. For allergies) 90 tablet 0   finasteride  (PROSCAR ) 5 MG tablet TAKE 1 TABLET BY MOUTH EVERY DAY 90 tablet 3   hydrocortisone  (ANUSOL -HC) 2.5 % rectal cream Place 1 Application rectally daily as needed for hemorrhoids or anal itching. 30 g 1   MAGNESIUM  PO Take 1 tablet by mouth daily.     montelukast  (SINGULAIR ) 10 MG tablet Take 1 tablet (10 mg total) by mouth at bedtime. 90 tablet 0   nitroGLYCERIN  (NITROSTAT ) 0.4 MG SL tablet Place 1 tablet (0.4 mg total) under the tongue every 5 (five) minutes x 3 doses as needed for chest pain. 25 tablet 12   olopatadine  (PATADAY ) 0.1 % ophthalmic solution Place 1 drop into both eyes daily as needed for allergies.     ondansetron  (ZOFRAN ) 4 MG tablet Take 1 tablet (4 mg total) by mouth every 8 (eight) hours as needed for nausea or vomiting. 31 tablet 0   pantoprazole  (PROTONIX ) 40 MG tablet TAKE 1 TABLET BY MOUTH TWICE A DAY 180 tablet 3   rosuvastatin  (CRESTOR ) 40 MG tablet Take 1 tablet (40 mg total) by mouth daily. 90 tablet 3   sitaGLIPtin  (JANUVIA ) 100 MG tablet Take 100 mg by mouth daily.     spironolactone  (ALDACTONE ) 25 MG tablet TAKE 1/2 TABLET BY MOUTH EVERY DAY 45 tablet 3   vitamin B-12 (CYANOCOBALAMIN ) 100 MCG tablet Take 100 mcg by mouth daily.     No current facility-administered medications on file prior to visit.   Allergies  Allergen Reactions   Doxazosin Mesylate Other (See Comments)    Drops in blood pressure    Losartan      Other reaction(s): Muscle pain   Testosterone      Other reaction(s): Erythrocytosis Other reaction(s): Erythrocytosis   Flomax  [Tamsulosin  Hcl] Itching and Other (See Comments)    Drops in blood pressure     Penicillins  Rash and Other (See Comments)    Has patient had a PCN reaction causing immediate rash, facial/tongue/throat swelling, SOB or lightheadedness with hypotension: No Has patient had a PCN reaction causing severe rash involving mucus membranes or skin necrosis: No Has patient had a PCN reaction that required hospitalization No Has patient had a PCN reaction occurring within the last 10 years: No If all of the above answers are NO, then may proceed with Cephalosporin use.    Sulfonamide Derivatives Rash   Trulicity  [Dulaglutide ] Nausea And Vomiting    N&V, Dizziness   Social History   Socioeconomic History   Marital status: Married  Spouse name: Not on file   Number of children: 1   Years of education: 7   Highest education level: High school graduate  Occupational History   Occupation: [employer]- retired  Tobacco Use   Smoking status: Former    Current packs/day: 0.00    Types: Cigarettes    Quit date: 08/30/1983    Years since quitting: 40.7   Smokeless tobacco: Former   Tobacco comments:    Quit 30 years ago.  Vaping Use   Vaping status: Never Used  Substance and Sexual Activity   Alcohol use: Not Currently    Comment: rare   Drug use: No   Sexual activity: Yes  Other Topics Concern   Not on file  Social History Narrative   Not on file   Social Drivers of Health   Financial Resource Strain: Low Risk  (12/22/2022)   Overall Financial Resource Strain (CARDIA)    Difficulty of Paying Living Expenses: Not hard at all  Food Insecurity: No Food Insecurity (12/29/2023)   Hunger Vital Sign    Worried About Running Out of Food in the Last Year: Never true    Ran Out of Food in the Last Year: Never true  Transportation Needs: No Transportation Needs (12/29/2023)   PRAPARE - Administrator, Civil Service (Medical): No    Lack of Transportation (Non-Medical): No  Physical Activity: Insufficiently Active (12/22/2022)   Exercise Vital Sign    Days of Exercise per Week: 3  days    Minutes of Exercise per Session: 30 min  Stress: No Stress Concern Present (12/22/2022)   [employer] of Occupational Health - Occupational Stress Questionnaire    Feeling of Stress : Not at all  Social Connections: Moderately Isolated (12/29/2023)   Social Connection and Isolation Panel    Frequency of Communication with Friends and Family: Once a week    Frequency of Social Gatherings with Friends and Family: Once a week    Attends Religious Services: Never    Database administrator or Organizations: No    Attends Engineer, structural: 1 to 4 times per year    Marital Status: Married  Catering manager Violence: Not At Risk (12/29/2023)   Humiliation, Afraid, Rape, and Kick questionnaire    Fear of Current or Ex-Partner: No    Emotionally Abused: No    Physically Abused: No    Sexually Abused: No     Review of Systems     Objective:   Physical Exam Vitals reviewed.  Constitutional:      General: He is not in acute distress.    Appearance: Normal appearance. He is obese. He is not ill-appearing or toxic-appearing.  Eyes:     General: Lids are normal.     Extraocular Movements:     Right eye: Normal extraocular motion.     Left eye: Normal extraocular motion.     Conjunctiva/sclera:     Left eye: Left conjunctiva is not injected.  Cardiovascular:     Rate and Rhythm: Normal rate and regular rhythm.     Pulses: Normal pulses.     Heart sounds: Normal heart sounds. No murmur heard.    No friction rub. No gallop.  Pulmonary:     Effort: Pulmonary effort is normal. No respiratory distress.     Breath sounds: Normal breath sounds. No stridor. No wheezing, rhonchi or rales.  Abdominal:     General: Abdomen is flat. Bowel sounds are normal. There is  no distension.     Palpations: Abdomen is soft. There is no mass.     Tenderness: There is no abdominal tenderness. There is no guarding or rebound.     Hernia: No hernia is present.  Musculoskeletal:      Right lower leg: No edema.     Left lower leg: No edema.     Right foot: Normal capillary refill. No crepitus.     Left foot: Normal capillary refill. No crepitus.  Neurological:     General: No focal deficit present.     Mental Status: He is alert and oriented to person, place, and time. Mental status is at baseline.  Psychiatric:        Mood and Affect: Mood normal.        Behavior: Behavior normal.        Thought Content: Thought content normal.        Judgment: Judgment normal.           Assessment & Plan:  Flu vaccine need - Plan: Flu vaccine HIGH DOSE PF(Fluzone Trivalent)  Primary hyperparathyroidism  Patient has primary hyperparathyroidism.  We discussed taking it bisphosphonate to prevent osteoporosis related to hyperparathyroidism.  However I agree with general surgery's recommendation to definitively treat the problem.  The patient continues to deal with abdominal bloating and constipation and muscle weakness which I feel are direct result of his hypercalcemia.  I am hoping that his quality of life will improve after correction.  Recommended that he proceed with his upcoming surgery.

## 2024-05-30 ENCOUNTER — Other Ambulatory Visit: Payer: Self-pay | Admitting: Family Medicine

## 2024-05-30 MED ORDER — HYDROCORTISONE 2.5 % EX CREA: TOPICAL_CREAM | Freq: Two times a day (BID) | CUTANEOUS | 0 refills | Status: DC

## 2024-06-04 ENCOUNTER — Ambulatory Visit (HOSPITAL_COMMUNITY)
Admission: RE | Admit: 2024-06-04 | Discharge: 2024-06-04 | Disposition: A | Discharge status: Home / Self Care | Source: Ambulatory Visit | Attending: Student in an Organized Health Care Education/Training Program | Admitting: Student in an Organized Health Care Education/Training Program

## 2024-06-04 DIAGNOSIS — I25119 Atherosclerotic heart disease of native coronary artery with unspecified angina pectoris: Secondary | ICD-10-CM | POA: Insufficient documentation

## 2024-06-04 DIAGNOSIS — I350 Nonrheumatic aortic (valve) stenosis: Secondary | ICD-10-CM | POA: Diagnosis not present

## 2024-06-04 LAB — ECHOCARDIOGRAM COMPLETE
AR max vel: 0.88 cm2
AV Area VTI: 0.84 cm2
AV Area mean vel: 0.9 cm2
AV Mean grad: 13 mmHg
AV Peak grad: 22.7 mmHg
Ao pk vel: 2.38 m/s
Area-P 1/2: 3.76 cm2
MV M vel: 3.03 m/s
MV Peak grad: 36.7 mmHg
S' Lateral: 3.7 cm

## 2024-06-07 ENCOUNTER — Ambulatory Visit: Payer: Self-pay | Admitting: Emergency Medicine

## 2024-06-07 ENCOUNTER — Encounter: Payer: Self-pay | Admitting: Cardiology

## 2024-06-07 DIAGNOSIS — I350 Nonrheumatic aortic (valve) stenosis: Secondary | ICD-10-CM

## 2024-06-07 NOTE — Patient Instructions (Addendum)
 SURGICAL WAITING ROOM VISITATION Patients having surgery or a procedure may have no more than 2 support people in the waiting area - these visitors may rotate in the visitor waiting room.   Due to an increase in RSV and influenza rates and associated hospitalizations, children ages 106 and under may not visit patients in [facility] hospitals. If the patient needs to stay at the hospital during part of their recovery, the visitor guidelines for inpatient rooms apply.  PRE-OP VISITATION  Pre-op nurse will coordinate an appropriate time for 1 support person to accompany the patient in pre-op.  This support person may not rotate.  This visitor will be contacted when the time is appropriate for the visitor to come back in the pre-op area.  Please refer to the [facility] website for the visitor guidelines for Inpatients (after your surgery is over and you are in a regular room).  You are not required to quarantine at this time prior to your surgery. However, you must do this: Hand Hygiene often Do NOT share personal items Notify your provider if you are in close contact with someone who has COVID or you develop fever 100.4 or greater, new onset of sneezing, cough, sore throat, shortness of breath or body aches.  If you test positive for Covid or have been in contact with anyone that has tested positive in the last 10 days please notify you surgeon.    Your procedure is scheduled on:  06/17/24  Report to [facility] Main Entrance: [place] entrance where the [employer] is available.   Report to admitting at: 11:00 AM  Call this number if you have any questions or problems the morning of surgery [phone]  FOLLOW ANY ADDITIONAL PRE OP INSTRUCTIONS YOU RECEIVED FROM YOUR SURGEON'S OFFICE!!!  Do not eat food after Midnight the night prior to your surgery/procedure.  After Midnight you may have the following liquids until: 10:00 AM DAY OF SURGERY  Clear Liquid Diet Water  Black  Coffee (sugar ok, NO MILK/CREAM OR CREAMERS)  Tea (sugar ok, NO MILK/CREAM OR CREAMERS) regular and decaf                             Plain Jell-O  with no fruit (NO RED)                                           Fruit ices (not with fruit pulp, NO RED)                                     Popsicles (NO RED)                                                                  Juice: NO CITRUS JUICES: only apple, WHITE grape, WHITE cranberry Sports drinks like Gatorade or Powerade (NO RED)   Oral Hygiene is also important to reduce your risk of infection.        Remember - BRUSH YOUR TEETH THE MORNING OF SURGERY WITH YOUR REGULAR TOOTHPASTE  Do NOT smoke after Midnight the night before surgery.  STOP TAKING all Vitamins, Herbs and supplements 1 week before your surgery.   Take ONLY these medicines the morning of surgery with A SIP OF WATER : fexofenadine ,finasteride ,pantoprazole .Tylenol  as needed.Use eye drops as usual.                  How to Manage Your Diabetes Before and After Surgery  Why is it important to control my blood sugar before and after surgery? Improving blood sugar levels before and after surgery helps healing and can limit problems. A way of improving blood sugar control is eating a healthy diet by:  Eating less sugar and carbohydrates  Increasing activity/exercise  Talking with your doctor about reaching your blood sugar goals High blood sugars (greater than 180 mg/dL) can raise your risk of infections and slow your recovery, so you will need to focus on controlling your diabetes during the weeks before surgery. Make sure that the doctor who takes care of your diabetes knows about your planned surgery including the date and location.  How do I manage my blood sugar before surgery? Check your blood sugar at least 4 times a day, starting 2 days before surgery, to make sure that the level is not too high or low. Check your blood sugar the morning of your surgery when you wake  up and every 2 hours until you get to the Short Stay unit. If your blood sugar is less than 70 mg/dL, you will need to treat for low blood sugar: Do not take insulin . Treat a low blood sugar (less than 70 mg/dL) with  cup of clear juice (cranberry or apple), 4 glucose tablets, OR glucose gel. Recheck blood sugar in 15 minutes after treatment (to make sure it is greater than 70 mg/dL). If your blood sugar is not greater than 70 mg/dL on recheck, call [phone] for further instructions. Report your blood sugar to the short stay nurse when you get to Short Stay.  If you are admitted to the hospital after surgery: Your blood sugar will be checked by the staff and you will probably be given insulin  after surgery (instead of oral diabetes medicines) to make sure you have good blood sugar levels. The goal for blood sugar control after surgery is 80-180 mg/dL.   WHAT DO I DO ABOUT MY DIABETES MEDICATION?  HOLD jardiance  after: 06/13/24      THE MORNING OF SURGERY, Do not take oral diabetes medicines (pills) the morning of surgery.  You may not have any metal on your body including hair pins, jewelry, and body piercing  Do not wear lotions, powders, perfumes / cologne, or deodorant  Men may shave face and neck.  Contacts, Hearing Aids, dentures or bridgework may not be worn into surgery. DENTURES WILL BE REMOVED PRIOR TO SURGERY PLEASE DO NOT APPLY Poly grip OR ADHESIVES!!!  You may bring a small overnight bag with you on the day of surgery, only pack items that are not valuable. [facility] IS NOT RESPONSIBLE   FOR VALUABLES THAT ARE LOST OR STOLEN.   Patients discharged on the day of surgery will not be allowed to drive home.  Someone NEEDS to stay with you for the first 24 hours after anesthesia.  Do not bring your home medications to the hospital. The Pharmacy will dispense medications listed on your medication list to you during your admission in the Hospital.  Special Instructions:  Bring a copy of your healthcare power of attorney  and living will documents the day of surgery, if you wish to have them scanned into your [facility] Records- EPIC  Please read over the following fact sheets you were given: IF YOU HAVE QUESTIONS ABOUT YOUR PRE-OP INSTRUCTIONS, PLEASE CALL [phone]   [facility] - Preparing for Surgery      Before surgery, you can play an important role.  Because skin is not sterile, your skin needs to be as free of germs as possible.  You can reduce the number of germs on your skin by washing with CHG (chlorahexidine gluconate) soap before surgery.  CHG is an antiseptic cleaner which kills germs and bonds with the skin to continue killing germs even after washing. Please DO NOT use if you have an allergy  to CHG or antibacterial soaps.  If your skin becomes reddened/irritated stop using the CHG and inform your nurse when you arrive at Short Stay. Do not shave (including legs and underarms) for at least 48 hours prior to the first CHG shower.  You may shave your face/neck.  Please follow these instructions carefully:  1.  Shower with CHG Soap the night before surgery ONLY (DO NOT USE THE SOAP THE MORNING OF SURGERY).  2.  If you choose to wash your hair, wash your hair first as usual with your normal  shampoo.  3.  After you shampoo, rinse your hair and body thoroughly to remove the shampoo.                             4.  Use CHG as you would any other liquid soap.  You can apply chg directly to the skin and wash.  Gently with a scrungie or clean washcloth.  5.  Apply the CHG Soap to your body ONLY FROM THE NECK DOWN.   Do not use on face/ open                           Wound or open sores. Avoid contact with eyes, ears mouth and genitals (private parts).                       Wash face,  Genitals (private parts) with your normal soap.             6.  Wash thoroughly, paying special attention to the area where your  surgery  will be performed.  7.   Thoroughly rinse your body with warm water  from the neck down.  8.  DO NOT shower/wash with your normal soap after using and rinsing off the CHG Soap.                9.  Pat yourself dry with a clean towel.            10.  Wear clean pajamas.            11.  Place clean sheets on your bed the night of your first shower and do not  sleep with pets.  Day of Surgery : Do not apply any CHG, lotions/deodorants the morning of surgery.  Please wear clean clothes to the hospital/surgery center.   FAILURE TO FOLLOW THESE INSTRUCTIONS MAY RESULT IN THE CANCELLATION OF YOUR SURGERY  PATIENT SIGNATURE_________________________________  NURSE SIGNATURE__________________________________  ________________________________________________________________________

## 2024-06-07 NOTE — Progress Notes (Signed)
 PERIOPERATIVE PRESCRIPTION FOR IMPLANTED CARDIAC DEVICE PROGRAMMING  Patient Information: Name:  Cameron Daniel  DOB:  21-Dec-1945  MRN:  [id number]  Planned Procedure:  Parathyroidectomy  Surgeon:  Dr. Krystal Spinner  Date of Procedure:  06/17/24  Cautery will be used.  Position during surgery:    Please send documentation back to:  [name] [phone] # [phone])  Device Information:  Clinic EP Physician:  Dr. Ole Holts  Device Type:  Defibrillator Manufacturer and Phone #:  [employer]tr[employer]: [id number] Pacemaker Dependent?:  Yes.   Date of Last Device Check:  04/01/24 Normal Device Function?:  Yes.    Electrophysiologist's Recommendations:  Have magnet available. Provide continuous ECG monitoring when magnet is used or reprogramming is to be performed.  Procedure will likely interfere with device function.  Device should be programmed:  Tachy therapies disabled  Per Device Clinic Standing Orders, [name], RN  3:48 PM 06/07/2024

## 2024-06-10 ENCOUNTER — Encounter (HOSPITAL_COMMUNITY): Payer: Self-pay

## 2024-06-10 ENCOUNTER — Other Ambulatory Visit: Payer: Self-pay

## 2024-06-10 ENCOUNTER — Encounter (HOSPITAL_COMMUNITY)
Admission: RE | Admit: 2024-06-10 | Discharge: 2024-06-10 | Disposition: A | Discharge status: Home / Self Care | Source: Ambulatory Visit | Attending: Surgery | Admitting: Surgery

## 2024-06-10 ENCOUNTER — Encounter (HOSPITAL_COMMUNITY): Payer: Self-pay | Admitting: Physician Assistant

## 2024-06-10 VITALS — BP 117/70 | HR 66 | Temp 97.6°F | Ht 70.0 in | Wt 181.0 lb

## 2024-06-10 DIAGNOSIS — E1169 Type 2 diabetes mellitus with other specified complication: Secondary | ICD-10-CM | POA: Insufficient documentation

## 2024-06-10 DIAGNOSIS — Z01812 Encounter for preprocedural laboratory examination: Secondary | ICD-10-CM | POA: Diagnosis not present

## 2024-06-10 DIAGNOSIS — E210 Primary hyperparathyroidism: Secondary | ICD-10-CM | POA: Diagnosis not present

## 2024-06-10 DIAGNOSIS — I5042 Chronic combined systolic (congestive) and diastolic (congestive) heart failure: Secondary | ICD-10-CM | POA: Diagnosis not present

## 2024-06-10 DIAGNOSIS — Z79899 Other long term (current) drug therapy: Secondary | ICD-10-CM | POA: Diagnosis not present

## 2024-06-10 DIAGNOSIS — Z95810 Presence of automatic (implantable) cardiac defibrillator: Secondary | ICD-10-CM | POA: Diagnosis not present

## 2024-06-10 DIAGNOSIS — I252 Old myocardial infarction: Secondary | ICD-10-CM | POA: Insufficient documentation

## 2024-06-10 DIAGNOSIS — Z87891 Personal history of nicotine dependence: Secondary | ICD-10-CM | POA: Insufficient documentation

## 2024-06-10 DIAGNOSIS — Z7984 Long term (current) use of oral hypoglycemic drugs: Secondary | ICD-10-CM | POA: Insufficient documentation

## 2024-06-10 DIAGNOSIS — Z951 Presence of aortocoronary bypass graft: Secondary | ICD-10-CM | POA: Diagnosis not present

## 2024-06-10 DIAGNOSIS — Z7982 Long term (current) use of aspirin: Secondary | ICD-10-CM | POA: Diagnosis not present

## 2024-06-10 DIAGNOSIS — I350 Nonrheumatic aortic (valve) stenosis: Secondary | ICD-10-CM | POA: Diagnosis not present

## 2024-06-10 DIAGNOSIS — K219 Gastro-esophageal reflux disease without esophagitis: Secondary | ICD-10-CM | POA: Diagnosis not present

## 2024-06-10 DIAGNOSIS — I2510 Atherosclerotic heart disease of native coronary artery without angina pectoris: Secondary | ICD-10-CM | POA: Insufficient documentation

## 2024-06-10 DIAGNOSIS — E785 Hyperlipidemia, unspecified: Secondary | ICD-10-CM | POA: Diagnosis not present

## 2024-06-10 DIAGNOSIS — I10 Essential (primary) hypertension: Secondary | ICD-10-CM

## 2024-06-10 HISTORY — DX: Post-traumatic stress disorder, unspecified: F43.10

## 2024-06-10 HISTORY — DX: Presence of automatic (implantable) cardiac defibrillator: Z95.810

## 2024-06-10 LAB — HEMOGLOBIN A1C
Hgb A1c MFr Bld: 6.6 % — ABNORMAL HIGH (ref 4.8–5.6)
Mean Plasma Glucose: 142.72 mg/dL

## 2024-06-10 LAB — BASIC METABOLIC PANEL WITH GFR
Anion gap: 10 (ref 5–15)
BUN: 16 mg/dL (ref 8–23)
CO2: 27 mmol/L (ref 22–32)
Calcium: 11.4 mg/dL — ABNORMAL HIGH (ref 8.9–10.3)
Chloride: 97 mmol/L — ABNORMAL LOW (ref 98–111)
Creatinine, Ser: 0.79 mg/dL (ref 0.61–1.24)
GFR, Estimated: >60 mL/min (ref >60)
Glucose, Bld: 126 mg/dL — ABNORMAL HIGH (ref 70–99)
Potassium: 4.1 mmol/L (ref 3.5–5.1)
Sodium: 134 mmol/L — ABNORMAL LOW (ref 135–145)

## 2024-06-10 LAB — CBC
HCT: 49.2 % (ref 39.0–52.0)
Hemoglobin: 15 g/dL (ref 13.0–17.0)
MCH: 26.7 pg (ref 26.0–34.0)
MCHC: 30.5 g/dL (ref 30.0–36.0)
MCV: 87.5 fL (ref 80.0–100.0)
Platelets: 160 K/uL (ref 150–400)
RBC: 5.62 MIL/uL (ref 4.22–5.81)
RDW: 14.3 % (ref 11.5–15.5)
WBC: 7.8 K/uL (ref 4.0–10.5)
nRBC: 0 % (ref 0.0–0.2)

## 2024-06-10 LAB — GLUCOSE, CAPILLARY: Glucose-Capillary: 138 mg/dL — ABNORMAL HIGH (ref 70–99)

## 2024-06-10 NOTE — Telephone Encounter (Signed)
 Pt called in for echo results.

## 2024-06-10 NOTE — Progress Notes (Addendum)
 For Anesthesia: PCP - Duanne Butler DASEN, MD  Cardiologist - O'Neal, Darryle Ned, MD  Cindie Ole DASEN, MD Electrophysiology   Bowel Prep reminder:  Chest x-ray -  EKG - 04/01/24 Stress Test -  ECHO - 06/04/24 Cardiac Cath - 02/2023 Pacemaker/ICD device last checked: 04/01/24 Pacemaker orders received: Yes : EPIC Device Rep notified: Yes  Spinal Cord Stimulator:N/A  Sleep Study - N/A CPAP -   Fasting Blood Sugar - 100's Checks Blood Sugar __3___ times a week Date and result of last Hgb A1c-  Last dose of GLP1 agonist- N/A GLP1 instructions: Hold 7 days prior to schedule (Hold 24 hours-daily)   Last dose of SGLT-2 inhibitors- jardiance  SGLT-2 instructions: Hold 72 hours prior to surgery: To hold after: 06/13/24  Blood Thinner Instructions:N/A Last Dose: Time last taken:  Aspirin  Instructions: Last Dose: Time last taken:  Activity level: Can go up a flight of stairs and activities of daily living without stopping and without chest pain and/or shortness of breath   Able to exercise without chest pain and/or shortness of breath  Anesthesia review: Hx: CHF,HTN,MI,DIA,Severe aortic stenosis.  Patient denies shortness of breath, fever, cough and chest pain at PAT appointment   Patient verbalized understanding of instructions that were reviewed over the telephone.

## 2024-06-11 ENCOUNTER — Encounter (HOSPITAL_COMMUNITY): Payer: Self-pay

## 2024-06-11 NOTE — Progress Notes (Signed)
 Case: [id number] Date/Time: 06/17/24 1300   Procedure: PARATHYROIDECTOMY - NECK EXPLORATION WITH PARATHYROIDECTOMY   Anesthesia type: General   Diagnosis: Primary hyperparathyroidism [E21.0]   Pre-op diagnosis: PRIMARY HYPERPARATHYROIDISM   Location: [facility] ROOM 01 / [facility]   Surgeons: Eletha Boas, MD       DISCUSSION: Cameron Daniel is a 79 year old male with past medical history of former smoking, history of MI, CAD s/p CABG x3 (04/2019), V. tach s/p ablation in ICD placement (05/2020), combined systolic and diastolic heart failure, moderate aortic stenosis, type 2 diabetes, GERD, esophageal stricture, arthritis  Patient follows with cardiology for history of CAD s/p CABG x3 (LIMA -> LAD, SVG -> OM, SVG to dRCA).  in 04/2019 with associated combined systolic and diastolic heart failure.  Patient then underwent V. tach ablation and ICD placement in 05/2020 with EP.  He had generator change out in May 2025  Last seen by EP on 8//2025.  He reported low blood pressures and his Toprol  dose was reduced.  Noted to be euvolemic on exam.  Device functioning normally with 99.5% BiV pacing.  Advised follow-up in 1 year.  Seen by general cardiology on 04/30/2024 for preop clearance.  He reported doing well and that his dizziness and orthostasis has improved since reducing his blood pressure medicine.  He was cleared for upcoming surgery:  Preoperative cardiovascular clearance Pending parathyroid  surgery on date TBD with [facility] surgery According to the Revised Cardiac Risk Index (RCRI), his Perioperative Risk of Major Cardiac Event is (%): 6.6. His Functional Capacity in METs is: 5.62 according to the Duke Activity Status Index (DASI). Therefore, based on ACC/AHA guidelines, patient would be at acceptable risk for the planned procedure without further cardiovascular testing. I will route this recommendation to the requesting party via Epic fax function. Ideally aspirin  should be continued without  interruption, however if the bleeding risk is too great, aspirin  may be held for 5-7 days prior to surgery. Please resume aspirin  post operatively when it is felt to be safe from a bleeding standpoint.  Echo was updated on 06/06/2024.  EF noted to be severely reduced to 25 to 30% and aortic stenosis had worsened into severe range.  Patient referred to the structural heart team consideration of TAVR  Dicussed with [facility] at [facility] on 10/14   VS: BP 117/70   Pulse 66   Temp 36.4 C (Oral)   Ht 5' 10 (1.778 m)   Wt 82.1 kg   SpO2 98%   BMI 25.97 kg/m   PROVIDERS: Duanne Butler DASEN, MD   LABS: Labs reviewed: Acceptable for surgery. (all labs ordered are listed, but only abnormal results are displayed)  Labs Reviewed  HEMOGLOBIN A1C - Abnormal; Notable for the following components:      Result Value   Hgb A1c MFr Bld 6.6 (*)    All other components within normal limits  BASIC METABOLIC PANEL WITH GFR - Abnormal; Notable for the following components:   Sodium 134 (*)    Chloride 97 (*)    Glucose, Bld 126 (*)    Calcium  11.4 (*)    All other components within normal limits  GLUCOSE, CAPILLARY - Abnormal; Notable for the following components:   Glucose-Capillary 138 (*)    All other components within normal limits  CBC     IMAGES:   EKG 04/01/24:  Atrial-sensed ventricular-paced rhythm  Echo 06/06/2024:  IMPRESSIONS    1. Left ventricular ejection fraction, by estimation, is 25 to 30%. The left ventricle  has severely decreased function. The left ventricle demonstrates global hypokinesis. There is severe concentric left ventricular hypertrophy. Left ventricular diastolic parameters are indeterminate.  2. Right ventricular systolic function is low normal. The right ventricular size is normal. There is normal pulmonary artery systolic pressure.  3. Left atrial size was mildly dilated.  4. Right atrial size was mildly dilated.  5. 2D MVA 4.06 cm2; calcified chords. The  mitral valve is degenerative. Mild mitral valve regurgitation. Severe mitral annular calcification.  6. Low stroke volume index with DVI 0.24; consistent with low flow low gradient aortic stenosis. The aortic valve is calcified. Aortic valve regurgitation is not visualized. Severe aortic valve stenosis. Aortic valve area, by VTI measures 0.84 cm.  Comparison(s): Prior images reviewed side by side. Function and DVI are worse from prior. Past Medical History:  Diagnosis Date   AICD (automatic cardioverter/defibrillator) present    Allergic rhinitis, cause unspecified    Allergy     Anxiety    Arthritis    Cancer (HCC)    a couple places removed from eyelid   Carotid Doppler 04/2020   Carotid US  9/21: No evidence of ICA stenosis bilaterally; right vertebral artery stenosis   Cataract    Chronic combined systolic (congestive) and diastolic (congestive) heart failure (HCC)    Coronary atherosclerosis of unspecified type of vessel, native or graft    a. MI 1985 with unclear details. b. CABG 03/2019 with LIMA -> LAD, SVG -> OM, SVG to dRCA.   Depression    PTSD    Diverticulosis of colon (without mention of hemorrhage)    Esophageal reflux    Esophageal stricture    Essential hypertension    Former tobacco use    Hemorrhoids    Hyperlipidemia    ICD (implantable cardioverter-defibrillator) in place    Irritable bowel syndrome    Ischemic cardiomyopathy    Mild carotid artery disease    a. 1-39% bilaterally 03/2019 duplex.   Myocardial infarction (HCC)    Nocturia    Obesity, unspecified    Orthostasis    Other acquired absence of organ    Personal history of colonic polyps    Psychosexual dysfunction with inhibited sexual excitement    PTSD (post-traumatic stress disorder)    Retroperitoneal bleed    Type II or unspecified type diabetes mellitus without mention of complication, not stated as uncontrolled    VT (ventricular tachycardia) (HCC)     Past Surgical History:   Procedure Laterality Date   ADENOIDECTOMY     CARPAL TUNNEL RELEASE     left    CHOLECYSTECTOMY     COLON RESECTION     18 inches   COLON SURGERY  2013   CORONARY ANGIOPLASTY     1985    CORONARY ARTERY BYPASS GRAFT N/A 04/26/2019   Procedure: CORONARY ARTERY BYPASS GRAFTING (CABG) x Three, using left internal mammary artery and right leg greater saphenous vein harvested endoscopically;  Surgeon: Lucas Dorise POUR, MD;  Location: [facility];  Service: Open Heart Surgery;  Laterality: N/A;   ELBOW BURSA SURGERY     EYE SURGERY     ICD GENERATOR CHANGEOUT N/A 12/28/2023   Procedure: ICD GENERATOR CHANGEOUT;  Surgeon: Cindie Ole DASEN, MD;  Location: [facility] INVASIVE CV LAB;  Service: Cardiovascular;  Laterality: N/A;   ICD IMPLANT N/A 06/25/2020   Procedure: ICD IMPLANT;  Surgeon: Cindie Ole DASEN, MD;  Location: [facility] INVASIVE CV LAB;  Service: Cardiovascular;  Laterality: N/A;   LEAD INSERTION N/A  12/28/2023   Procedure: LEAD INSERTION;  Surgeon: Cindie Ole DASEN, MD;  Location: [facility] INVASIVE CV LAB;  Service: Cardiovascular;  Laterality: N/A;   LEFT HEART CATH AND CORS/GRAFTS ANGIOGRAPHY N/A 06/19/2020   Procedure: LEFT HEART CATH AND CORS/GRAFTS ANGIOGRAPHY;  Surgeon: Burnard Debby LABOR, MD;  Location: [facility] INVASIVE CV LAB;  Service: Cardiovascular;  Laterality: N/A;   LEFT HEART CATH AND CORS/GRAFTS ANGIOGRAPHY N/A 03/08/2023   Procedure: LEFT HEART CATH AND CORS/GRAFTS ANGIOGRAPHY;  Surgeon: Darron Deatrice LABOR, MD;  Location: [facility] INVASIVE CV LAB;  Service: Cardiovascular;  Laterality: N/A;   POLYPECTOMY     RIGHT/LEFT HEART CATH AND CORONARY ANGIOGRAPHY N/A 04/16/2019   Procedure: RIGHT/LEFT HEART CATH AND CORONARY ANGIOGRAPHY;  Surgeon: Claudene Victory ORN, MD;  Location: [facility] INVASIVE CV LAB;  Service: Cardiovascular;  Laterality: N/A;   SHOULDER SURGERY     Rt. Shoulder   TEE WITHOUT CARDIOVERSION N/A 04/26/2019   Procedure: TRANSESOPHAGEAL ECHOCARDIOGRAM (TEE);  Surgeon: Lucas Dorise POUR, MD;  Location: [facility]  OR;  Service: Open Heart Surgery;  Laterality: N/A;   TONSILLECTOMY     V TACH ABLATION N/A 06/22/2020   Procedure: V TACH ABLATION;  Surgeon: Cindie Ole DASEN, MD;  Location: [facility] INVASIVE CV LAB;  Service: Cardiovascular;  Laterality: N/A;    MEDICATIONS:  acetaminophen  (TYLENOL ) 500 MG tablet   aspirin  EC 81 MG tablet   azelastine  (ASTELIN ) 0.1 % nasal spray   Cholecalciferol (VITAMIN D -3 PO)   Cholecalciferol (VITAMIN D3 PO)   cholestyramine  (QUESTRAN ) 4 g packet   clotrimazole  (LOTRIMIN ) 1 % cream   diphenoxylate -atropine  (LOMOTIL ) 2.5-0.025 MG tablet   empagliflozin  (JARDIANCE ) 25 MG TABS tablet   fexofenadine  (ALLEGRA ) 180 MG tablet   finasteride  (PROSCAR ) 5 MG tablet   hydrocortisone  2.5 % cream   MAGNESIUM  PO   metoprolol  succinate (TOPROL  XL) 25 MG 24 hr tablet   montelukast  (SINGULAIR ) 10 MG tablet   nitroGLYCERIN  (NITROSTAT ) 0.4 MG SL tablet   olopatadine  (PATADAY ) 0.1 % ophthalmic solution   pantoprazole  (PROTONIX ) 40 MG tablet   rosuvastatin  (CRESTOR ) 40 MG tablet   sitaGLIPtin  (JANUVIA ) 100 MG tablet   spironolactone  (ALDACTONE ) 25 MG tablet   No current facility-administered medications for this encounter.   Burnard CHRISTELLA Odis DEVONNA [facility] Short Stay/Anesthesiology [facility] Phone [phone] 06/11/2024 11:26 AM

## 2024-06-12 ENCOUNTER — Encounter: Payer: Self-pay | Admitting: Allergy & Immunology

## 2024-06-12 ENCOUNTER — Ambulatory Visit: Admitting: Allergy & Immunology

## 2024-06-12 VITALS — BP 98/62 | HR 72 | Temp 98.2°F | Ht 70.5 in | Wt 185.2 lb

## 2024-06-12 DIAGNOSIS — D351 Benign neoplasm of parathyroid gland: Secondary | ICD-10-CM | POA: Diagnosis not present

## 2024-06-12 DIAGNOSIS — J310 Chronic rhinitis: Secondary | ICD-10-CM

## 2024-06-12 DIAGNOSIS — I350 Nonrheumatic aortic (valve) stenosis: Secondary | ICD-10-CM | POA: Diagnosis not present

## 2024-06-12 NOTE — Patient Instructions (Addendum)
 1. Chronic rhinitis - Because of insurance stipulations, we cannot do skin testing on the same day as your first visit. - We are all working to fight this, but for now we need to do two separate visits.  - We will know more after we do testing at the next visit.  - The skin testing visit can be squeezed in at your convenience.  - Then we can make a more full plan to address all of your symptoms. - Be sure to stop your antihistamines for 3 days before this appointment.  - You need to stop your ALLEGRA , but you can remains on the SINGULAIR . - Continue with the eye drops as needed.   2. Return in about 1 week (around 06/19/2024) for ALLERGY  TESTING (1-55). You can have the follow up appointment with Dr. Iva or a Nurse Practicioner (our Nurse Practitioners are excellent and always have Physician oversight!).    Please inform us  of any Emergency Department visits, hospitalizations, or changes in symptoms. Call us  before going to the ED for breathing or allergy  symptoms since we might be able to fit you in for a sick visit. Feel free to contact us  anytime with any questions, problems, or concerns.  It was a pleasure to meet you today! Thank you for your service.  Websites that have reliable patient information: 1. American Academy of Asthma, Allergy , and Immunology: www.aaaai.org 2. Food Allergy  Research and Education (FARE): foodallergy.org 3. Mothers of Asthmatics: http://www.asthmacommunitynetwork.org 4. American College of Allergy , Asthma, and Immunology: www.acaai.org      "Like" us  on Facebook and Instagram for our latest updates!      A healthy democracy works best when [employer] participate! Make sure you are registered to vote! If you have moved or changed any of your contact information, you will need to get this updated before voting! Scan the QR codes below to learn more!

## 2024-06-12 NOTE — Progress Notes (Signed)
 NEW PATIENT  Date of Service/Encounter:  06/12/24  Consult requested by: Duanne Butler DASEN, MD   Assessment:   Chronic rhinitis  Aortic stenosis - need replacement (has appt Nov 3rd at [facility])  Parathyroid  adenoma  Previous smoker (quit 30 years ago)  Plan/Recommendations:   1. Chronic rhinitis - Because of insurance stipulations, we cannot do skin testing on the same day as your first visit. - We are all working to fight this, but for now we need to do two separate visits.  - We will know more after we do testing at the next visit.  - The skin testing visit can be squeezed in at your convenience.  - Then we can make a more full plan to address all of your symptoms. - Be sure to stop your antihistamines for 3 days before this appointment.  - You need to stop your ALLEGRA , but you can remains on the SINGULAIR . - Continue with the eye drops as needed.   2. Return in about 1 week (around 06/19/2024) for ALLERGY  TESTING (1-55). You can have the follow up appointment with Dr. Iva or a Nurse Practicioner (our Nurse Practitioners are excellent and always have Physician oversight!).   This note in its entirety was forwarded to the Provider who requested this consultation.  Subjective:   Cameron Daniel is a 78 y.o. male presenting today for evaluation of  Chief Complaint  Patient presents with   Allergic Rhinitis     Pt c/o year round allergy  symptoms, watery, itchy eyes, sneezing, nasal drainage which have become hard to manage at home. Pt has tried OTC allergy  medications, nasal sprays, and eye drops without relief. He said approx 15 years or more ago he took allergy  shots with [facility].     Cameron Daniel has a history of the following: Patient Active Problem List   Diagnosis Date Noted   Presence of heart assist device (HCC) 06/13/2024   Cardiac resynchronization therapy defibrillator (CRT-D) in place 12/28/2023   Complete heart block (HCC) 12/22/2023    Asthma without status asthmaticus 12/18/2023   Encounter for screening for eye and ear disorders 12/18/2023   Posterior tibial tendinitis, right leg 12/18/2023   Verruca 12/18/2023   Bradycardia 12/18/2023   Insomnia 12/18/2023   Benign neoplasm of soft tissue of retroperitoneum 01/02/2023   Basal cell carcinoma of skin of unspecified parts of face 01/02/2023   Abnormal findings on diagnostic imaging of other abdominal regions, including retroperitoneum 01/02/2023   Problem related to unspecified psychosocial circumstances 01/02/2023   Strain of insertion of tendon of hamstring muscle 10/15/2021   Strain of muscle, fascia and tendon of the posterior muscle group at thigh level, right thigh, initial encounter 10/15/2021   Posterior tibial tendon dysfunction (PTTD) of both lower extremities 10/01/2021   Abnormal gait 10/01/2021   Posterior tibial tendon dysfunction 10/01/2021   Neck pain on right side 10/28/2020   Urolithiasis 08/06/2020   Hypercalcemia 08/06/2020   Ventricular tachycardia (HCC) 06/19/2020   Low back pain 01/02/2020   Lumbar radiculopathy 11/30/2019   Acquired thrombophilia 10/08/2019   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   Hyponatremia 04/20/2019   Retroperitoneal hematoma 04/18/2019   Acute on chronic blood loss anemia 04/18/2019   HFrEF (heart failure with reduced ejection fraction) (HCC) 04/15/2019   Non-ST elevation (NSTEMI) myocardial infarction (HCC) 04/15/2019   AKI (acute kidney injury)    Acute respiratory failure (HCC) 04/12/2019   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   Knee pain 06/07/2018  Allergic conjunctivitis 01/23/2018   Personal history of other malignant neoplasm of skin 12/18/2014   Gout 04/06/2012   DIVERTICULITIS-COLON 06/18/2010   FACIAL FLUSHING 12/11/2007   NOCTURIA 12/11/2007   Type 2 diabetes mellitus with hyperlipidemia (HCC) 09/07/2007   OTHER MALAISE AND FATIGUE 09/07/2007   Allergic rhinitis 08/16/2007   HLD  (hyperlipidemia) 01/24/2007   OBESITY 01/24/2007   ERECTILE DYSFUNCTION 01/24/2007   HYPERTENSION 01/24/2007   Coronary artery disease involving native coronary artery of native heart with angina pectoris 01/24/2007   GERD (gastroesophageal reflux disease) 01/24/2007   DIVERTICULOSIS, COLON 01/24/2007   IRRITABLE BOWEL SYNDROME 01/24/2007   History of colonic polyps 01/24/2007   CHOLECYSTECTOMY, HX OF 01/24/2007    History obtained from: chart review and patient.  Discussed the use of AI scribe software for clinical note transcription with the patient and/or guardian, who gave verbal consent to proceed.  Cameron Daniel was referred by Duanne Butler DASEN, MD.     Cameron Daniel is a 78 y.o. male presenting for an evaluation of environmental allergies.  He experiences significant allergy  symptoms, including itchy eyes, nasal congestion, and throat irritation, which sometimes mimic flu-like symptoms. His eyes swell during these episodes. He previously received allergy  shots but discontinued them. Currently, he manages his symptoms with Allegra , Singulair  at night, Pataday  or Xelavor for his eyes, and occasionally Azelastine  nasal spray, although he avoids frequent use due to throat dryness.  He has a history of aortic valve disease, discovered during an echocardiogram, and is scheduled to see a specialist regarding this issue on November 3rd. He has previously undergone a triple bypass surgery. He was initially scheduled for parathyroid  surgery due to excessive calcium  production, but this was postponed due to the aortic valve issue.  He had an echocardiogram earlier this month that showed reduced FEV1 at 25 to 30%.  This has been slowly decreasing over the past few years.  They also noted severe aortic stenosis which had worsened since last year.  They are planning to do a total aortic valve replacement.  He mentions experiencing a loss of energy in his legs. He has been informed that the  excessive calcium  could lead to brittle bones. He has a history of exposure to Agent Orange during his service in [place], and has been told this is linked to his heart disease and diabetes.  No frequent sinus or ear infections. He has a history of smoking, having quit approximately 30 years ago. 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     Otherwise, there is no history of other atopic diseases, including drug allergies, stinging insect allergies, or contact dermatitis. There is no significant infectious history. Vaccinations are up to date.    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 12/28/2023   Complete heart block (HCC) 12/22/2023   Asthma without status asthmaticus 12/18/2023   Encounter for screening for eye and ear disorders 12/18/2023   Posterior tibial tendinitis, right leg 12/18/2023   Verruca 12/18/2023   Bradycardia 12/18/2023   Insomnia 12/18/2023   Benign neoplasm of soft tissue of retroperitoneum 01/02/2023   Basal cell carcinoma of skin of unspecified parts of face 01/02/2023   Abnormal findings on diagnostic imaging of other abdominal regions, including retroperitoneum 01/02/2023   Problem related to unspecified psychosocial circumstances 01/02/2023  Strain of insertion of tendon of hamstring muscle 10/15/2021   Strain of muscle, fascia and tendon of the posterior muscle group at thigh level, right thigh, initial encounter 10/15/2021   Posterior tibial tendon dysfunction (PTTD) of both lower extremities 10/01/2021   Abnormal gait 10/01/2021   Posterior tibial tendon dysfunction 10/01/2021   Neck pain on right side 10/28/2020   Urolithiasis 08/06/2020   Hypercalcemia 08/06/2020   Ventricular tachycardia (HCC) 06/19/2020   Low back pain 01/02/2020    Lumbar radiculopathy 11/30/2019   Acquired thrombophilia 10/08/2019   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   Hyponatremia 04/20/2019   Retroperitoneal hematoma 04/18/2019   Acute on chronic blood loss anemia 04/18/2019   HFrEF (heart failure with reduced ejection fraction) (HCC) 04/15/2019   Non-ST elevation (NSTEMI) myocardial infarction (HCC) 04/15/2019   AKI (acute kidney injury)    Acute respiratory failure (HCC) 04/12/2019   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   Knee pain 06/07/2018   Allergic conjunctivitis 01/23/2018   Personal history of other malignant neoplasm of skin 12/18/2014   Gout 04/06/2012   DIVERTICULITIS-COLON 06/18/2010   FACIAL FLUSHING 12/11/2007   NOCTURIA 12/11/2007   Type 2 diabetes mellitus with hyperlipidemia (HCC) 09/07/2007   OTHER MALAISE AND FATIGUE 09/07/2007   Allergic rhinitis 08/16/2007   HLD (hyperlipidemia) 01/24/2007   OBESITY 01/24/2007   ERECTILE DYSFUNCTION 01/24/2007   HYPERTENSION 01/24/2007   Coronary artery disease involving native coronary artery of native heart with angina pectoris 01/24/2007   GERD (gastroesophageal reflux disease) 01/24/2007   DIVERTICULOSIS, COLON 01/24/2007   IRRITABLE BOWEL SYNDROME 01/24/2007   History of colonic polyps 01/24/2007   CHOLECYSTECTOMY, HX OF 01/24/2007    Medication List:  Allergies as of 06/12/2024       Reactions   Doxazosin Mesylate Other (See Comments)   Drops in blood pressure    Losartan  Other (See Comments)   muscle pain   Testosterone  Other (See Comments)    Erythrocytosis   Flomax  [tamsulosin  Hcl] Itching, Other (See Comments)   Drops in blood pressure    Penicillins Rash, Other (See Comments)   Has patient had a PCN reaction causing immediate rash, facial/tongue/throat swelling, SOB or lightheadedness with hypotension: No Has patient had a PCN reaction causing severe rash involving mucus membranes or skin necrosis: No Has patient had a PCN reaction that required  hospitalization No Has patient had a PCN reaction occurring within the last 10 years: No If all of the above answers are NO, then may proceed with Cephalosporin use.   Sulfonamide Derivatives Rash   Trulicity  [dulaglutide ] Nausea And Vomiting   N&V, Dizziness        Medication List        Accurate as of June 12, 2024 11:59 PM. If you have any questions, ask your nurse or doctor.          acetaminophen  500 MG tablet Commonly known as: TYLENOL  Take 1,000 mg by mouth every 6 (six) hours as needed for mild pain (pain score 1-3).   aspirin  EC 81 MG tablet Take 1 tablet (81 mg total) by mouth daily.   azelastine  0.1 % nasal spray Commonly known as: ASTELIN  Place 2 sprays into both nostrils daily as needed for allergies or rhinitis. Use in each nostril as directed   cholestyramine  4 g packet Commonly known as: Questran  Take 0.5 packets (2 g total) by mouth 2 (two) times daily.   clotrimazole  1 % cream Commonly known as: LOTRIMIN  Apply 1 Application topically 2 (two)  times daily.   diphenoxylate -atropine  2.5-0.025 MG tablet Commonly known as: Lomotil  Take 2 tablets by mouth 4 (four) times daily as needed for diarrhea or loose stools.   empagliflozin  25 MG Tabs tablet Commonly known as: Jardiance  TAKE 25 MG (1 TABLET) BY MOUTH DAILY BEFORE BREAKFAST. STOP PIOGLITAZONE    fexofenadine  180 MG tablet Commonly known as: ALLEGRA  Take 180 mg by mouth in the morning.   finasteride  5 MG tablet Commonly known as: PROSCAR  TAKE 1 TABLET BY MOUTH EVERY DAY   hydrocortisone  2.5 % cream Apply topically 2 (two) times daily.   MAGNESIUM  PO Take 1 tablet by mouth in the morning.   metoprolol  succinate 25 MG 24 hr tablet Commonly known as: Toprol  XL Take 0.5 tablets (12.5 mg total) by mouth daily.   montelukast  10 MG tablet Commonly known as: SINGULAIR  Take 1 tablet (10 mg total) by mouth at bedtime.   nitroGLYCERIN  0.4 MG SL tablet Commonly known as: NITROSTAT  Place 1  tablet (0.4 mg total) under the tongue every 5 (five) minutes x 3 doses as needed for chest pain.   pantoprazole  40 MG tablet Commonly known as: PROTONIX  TAKE 1 TABLET BY MOUTH TWICE A DAY   Pataday  0.1 % ophthalmic solution Generic drug: olopatadine  Place 1 drop into both eyes 2 (two) times daily as needed for allergies.   rosuvastatin  40 MG tablet Commonly known as: CRESTOR  Take 1 tablet (40 mg total) by mouth daily.   sitaGLIPtin  100 MG tablet Commonly known as: JANUVIA  Take 100 mg by mouth in the morning.   spironolactone  25 MG tablet Commonly known as: ALDACTONE  TAKE 1/2 TABLET BY MOUTH EVERY DAY   VITAMIN D -3 PO Take 1 tablet by mouth in the morning.   VITAMIN D3 PO Take by mouth.        Birth History: non-contributory  Developmental History: non-contributory  Past Surgical History: Past Surgical History:  Procedure Laterality Date   ADENOIDECTOMY     CARPAL TUNNEL RELEASE     left    CHOLECYSTECTOMY     COLON RESECTION     18 inches   COLON SURGERY  2013   CORONARY ANGIOPLASTY     1985    CORONARY ARTERY BYPASS GRAFT N/A 04/26/2019   Procedure: CORONARY ARTERY BYPASS GRAFTING (CABG) x Three, using left internal mammary artery and right leg greater saphenous vein harvested endoscopically;  Surgeon: Lucas Dorise POUR, MD;  Location: [facility] OR;  Service: Open Heart Surgery;  Laterality: N/A;   ELBOW BURSA SURGERY     EYE SURGERY     ICD GENERATOR CHANGEOUT N/A 12/28/2023   Procedure: ICD GENERATOR CHANGEOUT;  Surgeon: Cindie Ole DASEN, MD;  Location: [facility] INVASIVE CV LAB;  Service: Cardiovascular;  Laterality: N/A;   ICD IMPLANT N/A 06/25/2020   Procedure: ICD IMPLANT;  Surgeon: Cindie Ole DASEN, MD;  Location: [facility] INVASIVE CV LAB;  Service: Cardiovascular;  Laterality: N/A;   LEAD INSERTION N/A 12/28/2023   Procedure: LEAD INSERTION;  Surgeon: Cindie Ole DASEN, MD;  Location: [facility] INVASIVE CV LAB;  Service: Cardiovascular;  Laterality: N/A;   LEFT HEART CATH AND  CORS/GRAFTS ANGIOGRAPHY N/A 06/19/2020   Procedure: LEFT HEART CATH AND CORS/GRAFTS ANGIOGRAPHY;  Surgeon: Burnard Debby LABOR, MD;  Location: [facility] INVASIVE CV LAB;  Service: Cardiovascular;  Laterality: N/A;   LEFT HEART CATH AND CORS/GRAFTS ANGIOGRAPHY N/A 03/08/2023   Procedure: LEFT HEART CATH AND CORS/GRAFTS ANGIOGRAPHY;  Surgeon: Darron Deatrice LABOR, MD;  Location: [facility] INVASIVE CV LAB;  Service: Cardiovascular;  Laterality:  N/A;   POLYPECTOMY     RIGHT/LEFT HEART CATH AND CORONARY ANGIOGRAPHY N/A 04/16/2019   Procedure: RIGHT/LEFT HEART CATH AND CORONARY ANGIOGRAPHY;  Surgeon: Claudene Victory ORN, MD;  Location: [facility] INVASIVE CV LAB;  Service: Cardiovascular;  Laterality: N/A;   SHOULDER SURGERY     Rt. Shoulder   TEE WITHOUT CARDIOVERSION N/A 04/26/2019   Procedure: TRANSESOPHAGEAL ECHOCARDIOGRAM (TEE);  Surgeon: Lucas Dorise POUR, MD;  Location: [facility] OR;  Service: Open Heart Surgery;  Laterality: N/A;   TONSILLECTOMY     V TACH ABLATION N/A 06/22/2020   Procedure: V TACH ABLATION;  Surgeon: Cindie Ole DASEN, MD;  Location: [facility] INVASIVE CV LAB;  Service: Cardiovascular;  Laterality: N/A;     Family History: Family History  Problem Relation Age of Onset   Lung cancer Father    Allergic rhinitis Father    Coronary artery disease Mother    Colon cancer Neg Hx    Heart attack Neg Hx    Hypertension Neg Hx    Stroke Neg Hx    Esophageal cancer Neg Hx    Rectal cancer Neg Hx    Stomach cancer Neg Hx    Pancreatic cancer Neg Hx      Social History: Javius lives at home with his family.  There is no smoking.  There are no pets.   Review of systems otherwise negative other than that mentioned in the HPI.    Objective:   Blood pressure 98/62, pulse 72, temperature 98.2 F (36.8 C), temperature source Temporal, height 5' 10.5 (1.791 m), weight 185 lb 3.2 oz (84 kg), SpO2 98%. Body mass index is 26.2 kg/m.     Physical Exam Constitutional:      Appearance: He is well-developed.  HENT:      Head: Normocephalic and atraumatic.     Right Ear: Tympanic membrane, ear canal and external ear normal. No drainage, swelling or tenderness. Tympanic membrane is not injected, scarred, erythematous, retracted or bulging.     Left Ear: Tympanic membrane, ear canal and external ear normal. No drainage, swelling or tenderness. Tympanic membrane is not injected, scarred, erythematous, retracted or bulging.     Nose: Rhinorrhea present. No nasal deformity, septal deviation or mucosal edema.     Right Turbinates: Enlarged and swollen.     Left Turbinates: Enlarged and swollen.     Right Sinus: No maxillary sinus tenderness or frontal sinus tenderness.     Left Sinus: No maxillary sinus tenderness or frontal sinus tenderness.     Mouth/Throat:     Mouth: Mucous membranes are not pale and not dry.     Pharynx: Uvula midline.  Eyes:     General:        Right eye: No discharge.        Left eye: No discharge.     Conjunctiva/sclera: Conjunctivae normal.     Right eye: Right conjunctiva is not injected. No chemosis.    Left eye: Left conjunctiva is not injected. No chemosis.    Pupils: Pupils are equal, round, and reactive to light.  Cardiovascular:     Rate and Rhythm: Normal rate and regular rhythm.     Heart sounds: Murmur heard.     Crescendo decrescendo systolic murmur is present with a grade of 4/6.     No S3 or S4 sounds.  Pulmonary:     Effort: Pulmonary effort is normal. No tachypnea, accessory muscle usage or respiratory distress.     Breath sounds: Normal breath  sounds. No wheezing, rhonchi or rales.  Chest:     Chest wall: No tenderness.  Abdominal:     Tenderness: There is no abdominal tenderness. There is no guarding or rebound.  Lymphadenopathy:     Head:     Right side of head: No submandibular, tonsillar or occipital adenopathy.     Left side of head: No submandibular, tonsillar or occipital adenopathy.     Cervical: No cervical adenopathy.  Skin:    Coloration: Skin  is not pale.     Findings: No abrasion, erythema, petechiae or rash. Rash is not papular, urticarial or vesicular.  Neurological:     Mental Status: He is alert.      Diagnostic studies: deferred due to insurance stipulations that require a separate visit for testing         Marty Shaggy, MD [facility] 

## 2024-06-13 ENCOUNTER — Ambulatory Visit (INDEPENDENT_AMBULATORY_CARE_PROVIDER_SITE_OTHER): Admitting: Family Medicine

## 2024-06-13 ENCOUNTER — Encounter: Payer: Self-pay | Admitting: Family Medicine

## 2024-06-13 ENCOUNTER — Other Ambulatory Visit: Payer: Self-pay | Admitting: Family Medicine

## 2024-06-13 ENCOUNTER — Telehealth: Payer: Self-pay

## 2024-06-13 VITALS — BP 124/82 | HR 77 | Temp 98.8°F | Ht 70.5 in | Wt 185.0 lb

## 2024-06-13 DIAGNOSIS — Z95811 Presence of heart assist device: Secondary | ICD-10-CM | POA: Insufficient documentation

## 2024-06-13 DIAGNOSIS — F419 Anxiety disorder, unspecified: Secondary | ICD-10-CM | POA: Diagnosis not present

## 2024-06-13 MED ORDER — CITALOPRAM HYDROBROMIDE 10 MG PO TABS: 10.0000 mg | ORAL_TABLET | Freq: Every day | ORAL | 3 refills | Status: AC

## 2024-06-13 NOTE — Telephone Encounter (Signed)
 Copied from CRM #[id number]. Topic: Clinical - Medical Advice >> Jun 13, 2024  8:32 AM Jeoffrey H wrote: Reason for CRM: Patient called and stated he had has an upcoming appointment to have an aorta valve replacement, already had a thyroid  procedure. Quit taking  citalopram  10mg  for anxiety a few months ago wanted to know if it was okay to take it again with the procedure coming up. Stated that his cardiologist prescribed the medication. Advised that I would send message over but to also possibly reach out to cardio just in case. Denied having mental issues currently.    [phone]

## 2024-06-13 NOTE — Telephone Encounter (Signed)
Pt has appt today w/ PCP.  °

## 2024-06-13 NOTE — Progress Notes (Signed)
 Subjective:    Patient ID: Cameron Daniel, male    DOB: 25-Mar-1946, 78 y.o.   MRN: [id number]  Patient has a history of primary hyperparathyroidism.  Patient has seen general surgery.  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  Patient is scheduled for surgical excision to treat hyperparathyroidism.  Continues to deal with constipation and muscle weakness which he attributes to the hyperparathyroidism.  However, recently went to cardiology for clearance for surgery.  Echocardiogram revealed decline in his ejection fraction of 25 to 30%.  He also has severe aortic stenosis.  They have delayed his elective parathyroidectomy in favor of an aortic valve replacement.  He has an appointment to see the cardiologist in November.  Patient is extremely anxious.  He wants to restart citalopram .  He had taken Syprol pram for anxiety for several years.  He was on a low-dose.  However he discontinued the medication on his own less than a year ago.  He was doing well until he recently became aware of all of these ongoing medical issues.  The hyperparathyroidism, parathyroid  surgery, aortic valve replacement all have him extremely worried and anxious.  Also several of his friends have died recently.  This has been contemplating his own mortality Past Surgical History:  Procedure Laterality Date   ADENOIDECTOMY     CARPAL TUNNEL RELEASE     left    CHOLECYSTECTOMY     COLON RESECTION     18 inches   COLON SURGERY  2013   CORONARY ANGIOPLASTY     1985    CORONARY ARTERY BYPASS GRAFT N/A 04/26/2019   Procedure: CORONARY ARTERY BYPASS GRAFTING (CABG) x Three, using left internal mammary artery and right leg greater saphenous vein harvested endoscopically;  Surgeon: Lucas Dorise POUR, MD;  Location: [facility] OR;  Service: Open Heart Surgery;  Laterality: N/A;   ELBOW BURSA SURGERY     EYE SURGERY     ICD GENERATOR CHANGEOUT N/A 12/28/2023   Procedure: ICD GENERATOR CHANGEOUT;  Surgeon: Cindie Ole DASEN, MD;  Location: [facility] INVASIVE CV LAB;  Service: Cardiovascular;  Laterality: N/A;   ICD IMPLANT N/A 06/25/2020   Procedure: ICD IMPLANT;  Surgeon: Cindie Ole DASEN, MD;  Location: [facility] INVASIVE CV LAB;  Service: Cardiovascular;  Laterality: N/A;   LEAD INSERTION N/A 12/28/2023   Procedure: LEAD INSERTION;  Surgeon: Cindie Ole DASEN, MD;  Location: [facility] INVASIVE CV LAB;  Service: Cardiovascular;  Laterality: N/A;   LEFT HEART CATH AND CORS/GRAFTS ANGIOGRAPHY N/A 06/19/2020   Procedure: LEFT HEART CATH AND CORS/GRAFTS ANGIOGRAPHY;  Surgeon: Burnard Debby LABOR, MD;  Location: [facility] INVASIVE CV LAB;  Service: Cardiovascular;  Laterality: N/A;   LEFT HEART CATH AND CORS/GRAFTS ANGIOGRAPHY N/A 03/08/2023   Procedure: LEFT HEART CATH AND CORS/GRAFTS ANGIOGRAPHY;  Surgeon: Darron Deatrice LABOR, MD;  Location: [facility] INVASIVE CV LAB;  Service: Cardiovascular;  Laterality: N/A;   POLYPECTOMY     RIGHT/LEFT HEART CATH AND CORONARY ANGIOGRAPHY N/A 04/16/2019   Procedure: RIGHT/LEFT HEART CATH AND CORONARY ANGIOGRAPHY;  Surgeon: Claudene Victory LELON, MD;  Location: [facility] INVASIVE CV LAB;  Service: Cardiovascular;  Laterality: N/A;   SHOULDER SURGERY     Rt. Shoulder   TEE WITHOUT CARDIOVERSION N/A 04/26/2019   Procedure: TRANSESOPHAGEAL ECHOCARDIOGRAM (TEE);  Surgeon: Lucas Dorise POUR, MD;  Location: [facility] OR;  Service: Open Heart Surgery;  Laterality: N/A;   TONSILLECTOMY     V TACH ABLATION N/A 06/22/2020   Procedure: V TACH ABLATION;  Surgeon: Cindie Ole DASEN, MD;  Location: [facility] INVASIVE CV LAB;  Service: Cardiovascular;  Laterality: N/A;   Current Outpatient Medications on File Prior to Visit  Medication Sig Dispense Refill   acetaminophen  (TYLENOL ) 500 MG tablet Take 1,000 mg by mouth every 6 (six) hours as needed for mild pain (pain score 1-3).     aspirin  EC 81 MG tablet Take 1 tablet (81 mg total) by mouth daily. 90 tablet 3   Cholecalciferol (VITAMIN D -3 PO) Take 1 tablet by mouth in the morning.     clotrimazole   (LOTRIMIN ) 1 % cream Apply 1 Application topically 2 (two) times daily. 30 g 2   diphenoxylate -atropine  (LOMOTIL ) 2.5-0.025 MG tablet Take 2 tablets by mouth 4 (four) times daily as needed for diarrhea or loose stools. 30 tablet 0   empagliflozin  (JARDIANCE ) 25 MG TABS tablet TAKE 25 MG (1 TABLET) BY MOUTH DAILY BEFORE BREAKFAST. STOP PIOGLITAZONE  90 tablet 0   fexofenadine  (ALLEGRA ) 180 MG tablet Take 180 mg by mouth in the morning.     finasteride  (PROSCAR ) 5 MG tablet TAKE 1 TABLET BY MOUTH EVERY DAY 90 tablet 3   hydrocortisone  2.5 % cream Apply topically 2 (two) times daily. 30 g 0   MAGNESIUM  PO Take 1 tablet by mouth in the morning.     metoprolol  succinate (TOPROL  XL) 25 MG 24 hr tablet Take 0.5 tablets (12.5 mg total) by mouth daily. 90 tablet 3   montelukast  (SINGULAIR ) 10 MG tablet Take 1 tablet (10 mg total) by mouth at bedtime. 90 tablet 0   nitroGLYCERIN  (NITROSTAT ) 0.4 MG SL tablet Place 1 tablet (0.4 mg total) under the tongue every 5 (five) minutes x 3 doses as needed for chest pain. 25 tablet 12   olopatadine  (PATADAY ) 0.1 % ophthalmic solution Place 1 drop into both eyes 2 (two) times daily as needed for allergies.     pantoprazole  (PROTONIX ) 40 MG tablet TAKE 1 TABLET BY MOUTH TWICE A DAY 180 tablet 3   rosuvastatin  (CRESTOR ) 40 MG tablet Take 1 tablet (40 mg total) by mouth daily. 90 tablet 3   sitaGLIPtin  (JANUVIA ) 100 MG tablet Take 100 mg by mouth in the morning.     spironolactone  (ALDACTONE ) 25 MG tablet TAKE 1/2 TABLET BY MOUTH EVERY DAY 45 tablet 3   Cholecalciferol (VITAMIN D3 PO) Take by mouth. (Patient not taking: Reported on 06/13/2024)     cholestyramine  (QUESTRAN ) 4 g packet Take 0.5 packets (2 g total) by mouth 2 (two) times daily. (Patient not taking: Reported on 06/13/2024) 60 each 3   No current facility-administered medications on file prior to visit.   Allergies  Allergen Reactions   Doxazosin Mesylate Other (See Comments)    Drops in blood pressure     Losartan  Other (See Comments)    muscle pain   Testosterone  Other (See Comments)     Erythrocytosis    Flomax  [Tamsulosin  Hcl] Itching and Other (See Comments)    Drops in blood pressure     Penicillins Rash and Other (See Comments)    Has patient had a PCN reaction causing immediate rash, facial/tongue/throat swelling, SOB or lightheadedness with hypotension: No Has patient had a PCN reaction causing severe rash involving mucus membranes or skin necrosis: No Has patient had a PCN reaction that required hospitalization No Has patient had a PCN reaction occurring within the last 10 years: No If all of the above answers are NO, then may proceed with Cephalosporin use.    Sulfonamide Derivatives  Rash   Trulicity  [Dulaglutide ] Nausea And Vomiting    N&V, Dizziness   Social History   Socioeconomic History   Marital status: Married    Spouse name: Not on file   Number of children: 1   Years of education: 12   Highest education level: High school graduate  Occupational History   Occupation: Therapist, sports- retired  Tobacco Use   Smoking status: Former    Current packs/day: 0.00    Types: Cigarettes    Quit date: 08/30/1983    Years since quitting: 40.8   Smokeless tobacco: Former   Tobacco comments:    Quit 30 years ago.  Vaping Use   Vaping status: Never Used  Substance and Sexual Activity   Alcohol use: Not Currently    Comment: rare   Drug use: No   Sexual activity: Yes  Other Topics Concern   Not on file  Social History Narrative   Not on file   Social Drivers of Health   Financial Resource Strain: Low Risk  (12/22/2022)   Overall Financial Resource Strain (CARDIA)    Difficulty of Paying Living Expenses: Not hard at all  Food Insecurity: No Food Insecurity (12/29/2023)   Hunger Vital Sign    Worried About Running Out of Food in the Last Year: Never true    Ran Out of Food in the Last Year: Never true  Transportation Needs: No Transportation Needs (12/29/2023)   PRAPARE -  Administrator, Civil Service (Medical): No    Lack of Transportation (Non-Medical): No  Physical Activity: Insufficiently Active (12/22/2022)   Exercise Vital Sign    Days of Exercise per Week: 3 days    Minutes of Exercise per Session: 30 min  Stress: No Stress Concern Present (12/22/2022)   [employer] of Occupational Health - Occupational Stress Questionnaire    Feeling of Stress : Not at all  Social Connections: Moderately Isolated (12/29/2023)   Social Connection and Isolation Panel    Frequency of Communication with Friends and Family: Once a week    Frequency of Social Gatherings with Friends and Family: Once a week    Attends Religious Services: Never    Database administrator or Organizations: No    Attends Engineer, structural: 1 to 4 times per year    Marital Status: Married  Catering manager Violence: Not At Risk (12/29/2023)   Humiliation, Afraid, Rape, and Kick questionnaire    Fear of Current or Ex-Partner: No    Emotionally Abused: No    Physically Abused: No    Sexually Abused: No     Review of Systems     Objective:   Physical Exam Vitals reviewed.  Constitutional:      General: He is not in acute distress.    Appearance: Normal appearance. He is obese. He is not ill-appearing or toxic-appearing.  Eyes:     General: Lids are normal.     Extraocular Movements:     Right eye: Normal extraocular motion.     Left eye: Normal extraocular motion.     Conjunctiva/sclera:     Left eye: Left conjunctiva is not injected.  Cardiovascular:     Rate and Rhythm: Normal rate and regular rhythm.     Pulses: Normal pulses.     Heart sounds: Murmur heard.     No friction rub. No gallop.  Pulmonary:     Effort: Pulmonary effort is normal. No respiratory distress.     Breath sounds:  Normal breath sounds. No stridor. No wheezing, rhonchi or rales.  Abdominal:     General: Abdomen is flat. Bowel sounds are normal. There is no distension.      Palpations: Abdomen is soft. There is no mass.     Tenderness: There is no abdominal tenderness. There is no guarding or rebound.     Hernia: No hernia is present.  Musculoskeletal:     Right foot: Normal capillary refill. No crepitus.     Left foot: Normal capillary refill. No crepitus.  Neurological:     General: No focal deficit present.     Mental Status: He is alert and oriented to person, place, and time. Mental status is at baseline.  Psychiatric:        Mood and Affect: Mood normal.        Behavior: Behavior normal.        Thought Content: Thought content normal.        Judgment: Judgment normal.           Assessment & Plan:  Anxiety Try to reassure the patient the best food regarding his upcoming valve replacement.  I agreed to resume citalopram  10 mg daily and uptitrate to a maximum of 20 if necessary.  I would not increase beyond 20 due to the potential risk of QT interval prolongation.  Also recommended the patient discontinue vitamin D  due to hypercalcemia

## 2024-06-14 ENCOUNTER — Ambulatory Visit: Admitting: Family Medicine

## 2024-06-17 ENCOUNTER — Telehealth: Payer: Self-pay | Admitting: Cardiovascular Disease

## 2024-06-17 ENCOUNTER — Ambulatory Visit (HOSPITAL_COMMUNITY): Admission: RE | Admit: 2024-06-17 | Source: Ambulatory Visit | Admitting: Surgery

## 2024-06-17 ENCOUNTER — Encounter (HOSPITAL_COMMUNITY): Admission: RE | Payer: Self-pay | Source: Ambulatory Visit

## 2024-06-17 SURGERY — PARATHYROIDECTOMY
Anesthesia: General

## 2024-06-17 NOTE — Telephone Encounter (Signed)
 Pt calling in regards to an irregular EKG he had today when going in for a thyroid  procedure. Pt complains of feeling fatigued and jittery. Please advise.

## 2024-06-17 NOTE — Telephone Encounter (Signed)
 Patient identification verified by 2 forms.   Called and spoke to patient  Patient states:  -Pt wants to be seen sooner -I just don't have any get up and go   Interventions/Plan: -Appt added to wait list.    Patient agrees with plan, no questions at this time

## 2024-06-17 NOTE — Telephone Encounter (Signed)
 Irregular Echo, not EKG. Echo was 25%

## 2024-06-26 ENCOUNTER — Ambulatory Visit: Admitting: Allergy & Immunology

## 2024-07-01 ENCOUNTER — Ambulatory Visit (INDEPENDENT_AMBULATORY_CARE_PROVIDER_SITE_OTHER)

## 2024-07-01 ENCOUNTER — Ambulatory Visit: Attending: Cardiovascular Disease | Admitting: Cardiovascular Disease

## 2024-07-01 VITALS — BP 106/62 | HR 69 | Ht 70.5 in | Wt 189.0 lb

## 2024-07-01 DIAGNOSIS — I350 Nonrheumatic aortic (valve) stenosis: Secondary | ICD-10-CM | POA: Diagnosis not present

## 2024-07-01 DIAGNOSIS — I472 Ventricular tachycardia, unspecified: Secondary | ICD-10-CM | POA: Diagnosis not present

## 2024-07-01 NOTE — Progress Notes (Signed)
 [facility] Consult Note  Chief Complaint  Patient presents with   New Patient (Initial Visit)    Aortic stenosis   History of Present Illness: 78 yo male with history of CAD s/p 3V CABG, ischemic cardiomyopathy, chronic systolic CHF, VT s/p ablation with ICD in place, HTN, DM, HLD and severe aortic stenosis who is here today as a new consult, referred by Dr. Barbaraann, to discuss his aortic stenosis and possible TAVR. He had a NSTEMI in August 2020 and was found to have three vessel CAD. He underwent 3V CABG (LIMA to LAD, SVG to OM, SVG to RCA) in August 2020. Echo in 2020 with LVEF=25-30%. He was admitted with VT in 2021 with stable CAD on cath at that time and had a VT ablation and ICD placed. Echo in 2023 with LVEF=40-45%. Cardiac cath July 2024 following an abnormal Cardiac PET/CT with 3 patent grafts. Echo in October 2024 with LVEF=35-40% and moderate aortic stenosis. He has done well over the last year. He was seen in our office 04/30/24 by Lum Louis, NP and had no complaints with a pending parathyroid  surgery. Echo October 2025 with LVEF=25-30% with global hypokinesis. Severe MAC with mild MR.  Severe low flow, low gradient aortic stenosis with mean gradient 13 mmHg, AVA 0.84 cm2, SVI 24, DI 0.24. He has a nodule c/w parathyroid  adenoma. His parathyroid  surgery has been delayed with his valve issue.   He tells me today that he has no energy, progressive dyspnea but no chest pain. He feels lightheaded at times but no near syncope or syncope. No LE edema. He lives in [place], [place] with his wife. He is retired from aeronautical engineer. He has full dental implants.   Primary Care Physician: Duanne Butler DASEN, MD Primary Cardiologist: Barbaraann Referring Cardiologist: Barbaraann Advanced Heart Failure: Bensimhon EP: Cindie  Past Medical History:  Diagnosis Date   AICD (automatic cardioverter/defibrillator) present    Allergic rhinitis, cause unspecified    Allergy     Anxiety     Arthritis    Cancer [facility])    a couple places removed from eyelid   Carotid Doppler 04/2020   Carotid US  9/21: No evidence of ICA stenosis bilaterally; right vertebral artery stenosis   Cataract    Chronic combined systolic (congestive) and diastolic (congestive) heart failure (HCC)    Coronary atherosclerosis of unspecified type of vessel, native or graft    a. MI 1985 with unclear details. b. CABG 03/2019 with LIMA -> LAD, SVG -> OM, SVG to dRCA.   Depression    PTSD    Diverticulosis of colon (without mention of hemorrhage)    Esophageal reflux    Esophageal stricture    Essential hypertension    Former tobacco use    Hemorrhoids    Hyperlipidemia    ICD (implantable cardioverter-defibrillator) in place    Irritable bowel syndrome    Ischemic cardiomyopathy    Mild carotid artery disease    a. 1-39% bilaterally 03/2019 duplex.   Myocardial infarction (HCC)    Nocturia    Obesity, unspecified    Orthostasis    Other acquired absence of organ    Personal history of colonic polyps    Psychosexual dysfunction with inhibited sexual excitement    PTSD (post-traumatic stress disorder)    Retroperitoneal bleed    Type II or unspecified type diabetes mellitus without mention of complication, not stated as uncontrolled    VT (ventricular tachycardia) (HCC)     Past Surgical History:  Procedure Laterality Date   ADENOIDECTOMY     CARPAL TUNNEL RELEASE     left    CHOLECYSTECTOMY     COLON RESECTION     18 inches   COLON SURGERY  2013   CORONARY ANGIOPLASTY     1985    CORONARY ARTERY BYPASS GRAFT N/A 04/26/2019   Procedure: CORONARY ARTERY BYPASS GRAFTING (CABG) x Three, using left internal mammary artery and right leg greater saphenous vein harvested endoscopically;  Surgeon: Lucas Dorise POUR, MD;  Location: [facility] OR;  Service: Open Heart Surgery;  Laterality: N/A;   ELBOW BURSA SURGERY     EYE SURGERY     ICD GENERATOR CHANGEOUT N/A 12/28/2023   Procedure: ICD GENERATOR  CHANGEOUT;  Surgeon: Cindie Ole DASEN, MD;  Location: [facility] INVASIVE CV LAB;  Service: Cardiovascular;  Laterality: N/A;   ICD IMPLANT N/A 06/25/2020   Procedure: ICD IMPLANT;  Surgeon: Cindie Ole DASEN, MD;  Location: [facility] INVASIVE CV LAB;  Service: Cardiovascular;  Laterality: N/A;   LEAD INSERTION N/A 12/28/2023   Procedure: LEAD INSERTION;  Surgeon: Cindie Ole DASEN, MD;  Location: [facility] INVASIVE CV LAB;  Service: Cardiovascular;  Laterality: N/A;   LEFT HEART CATH AND CORS/GRAFTS ANGIOGRAPHY N/A 06/19/2020   Procedure: LEFT HEART CATH AND CORS/GRAFTS ANGIOGRAPHY;  Surgeon: Burnard Debby LABOR, MD;  Location: [facility] INVASIVE CV LAB;  Service: Cardiovascular;  Laterality: N/A;   LEFT HEART CATH AND CORS/GRAFTS ANGIOGRAPHY N/A 03/08/2023   Procedure: LEFT HEART CATH AND CORS/GRAFTS ANGIOGRAPHY;  Surgeon: Darron Deatrice LABOR, MD;  Location: [facility] INVASIVE CV LAB;  Service: Cardiovascular;  Laterality: N/A;   POLYPECTOMY     RIGHT/LEFT HEART CATH AND CORONARY ANGIOGRAPHY N/A 04/16/2019   Procedure: RIGHT/LEFT HEART CATH AND CORONARY ANGIOGRAPHY;  Surgeon: Claudene Victory ORN, MD;  Location: [facility] INVASIVE CV LAB;  Service: Cardiovascular;  Laterality: N/A;   SHOULDER SURGERY     Rt. Shoulder   TEE WITHOUT CARDIOVERSION N/A 04/26/2019   Procedure: TRANSESOPHAGEAL ECHOCARDIOGRAM (TEE);  Surgeon: Lucas Dorise POUR, MD;  Location: [facility] OR;  Service: Open Heart Surgery;  Laterality: N/A;   TONSILLECTOMY     V TACH ABLATION N/A 06/22/2020   Procedure: V TACH ABLATION;  Surgeon: Cindie Ole DASEN, MD;  Location: [facility] INVASIVE CV LAB;  Service: Cardiovascular;  Laterality: N/A;    Current Outpatient Medications  Medication Sig Dispense Refill   acetaminophen  (TYLENOL ) 500 MG tablet Take 1,000 mg by mouth every 6 (six) hours as needed for mild pain (pain score 1-3).     aspirin  EC 81 MG tablet Take 1 tablet (81 mg total) by mouth daily. 90 tablet 3   Azelastine  HCl 137 MCG/SPRAY SOLN PLACE 1 SPRAY INTO BOTH NOSTRILS 2 (TWO) TIMES  DAILY DIRECTED 30 mL 1   Cholecalciferol (VITAMIN D -3 PO) Take 1 tablet by mouth in the morning.     Cholecalciferol (VITAMIN D3 PO) Take by mouth.     cholestyramine  (QUESTRAN ) 4 g packet Take 0.5 packets (2 g total) by mouth 2 (two) times daily. 60 each 3   citalopram  (CELEXA ) 10 MG tablet Take 1 tablet (10 mg total) by mouth daily. 90 tablet 3   clotrimazole  (LOTRIMIN ) 1 % cream Apply 1 Application topically 2 (two) times daily. 30 g 2   diphenoxylate -atropine  (LOMOTIL ) 2.5-0.025 MG tablet Take 2 tablets by mouth 4 (four) times daily as needed for diarrhea or loose stools. 30 tablet 0   empagliflozin  (JARDIANCE ) 25 MG TABS tablet TAKE 25 MG (1 TABLET) BY MOUTH  DAILY BEFORE BREAKFAST. STOP PIOGLITAZONE  90 tablet 0   fexofenadine  (ALLEGRA ) 180 MG tablet Take 180 mg by mouth in the morning.     finasteride  (PROSCAR ) 5 MG tablet TAKE 1 TABLET BY MOUTH EVERY DAY 90 tablet 3   hydrocortisone  2.5 % cream Apply topically 2 (two) times daily. 30 g 0   MAGNESIUM  PO Take 1 tablet by mouth in the morning.     metoprolol  succinate (TOPROL  XL) 25 MG 24 hr tablet Take 0.5 tablets (12.5 mg total) by mouth daily. 90 tablet 3   montelukast  (SINGULAIR ) 10 MG tablet Take 1 tablet (10 mg total) by mouth at bedtime. 90 tablet 0   nitroGLYCERIN  (NITROSTAT ) 0.4 MG SL tablet Place 1 tablet (0.4 mg total) under the tongue every 5 (five) minutes x 3 doses as needed for chest pain. 25 tablet 12   olopatadine  (PATADAY ) 0.1 % ophthalmic solution Place 1 drop into both eyes 2 (two) times daily as needed for allergies.     pantoprazole  (PROTONIX ) 40 MG tablet TAKE 1 TABLET BY MOUTH TWICE A DAY 180 tablet 3   rosuvastatin  (CRESTOR ) 40 MG tablet Take 1 tablet (40 mg total) by mouth daily. 90 tablet 3   sitaGLIPtin  (JANUVIA ) 100 MG tablet Take 100 mg by mouth in the morning.     spironolactone  (ALDACTONE ) 25 MG tablet TAKE 1/2 TABLET BY MOUTH EVERY DAY 45 tablet 3   No current facility-administered medications for this visit.     Allergies  Allergen Reactions   Doxazosin Mesylate Other (See Comments)    Drops in blood pressure    Losartan  Other (See Comments)    muscle pain   Testosterone  Other (See Comments)     Erythrocytosis    Flomax  [Tamsulosin  Hcl] Itching and Other (See Comments)    Drops in blood pressure     Penicillins Rash and Other (See Comments)    Has patient had a PCN reaction causing immediate rash, facial/tongue/throat swelling, SOB or lightheadedness with hypotension: No Has patient had a PCN reaction causing severe rash involving mucus membranes or skin necrosis: No Has patient had a PCN reaction that required hospitalization No Has patient had a PCN reaction occurring within the last 10 years: No If all of the above answers are NO, then may proceed with Cephalosporin use.    Sulfonamide Derivatives Rash   Trulicity  [Dulaglutide ] Nausea And Vomiting    N&V, Dizziness    Social History   Socioeconomic History   Marital status: Married    Spouse name: Not on file   Number of children: 1   Years of education: 12   Highest education level: High school graduate  Occupational History   Occupation: Therapist, Sports- retired  Tobacco Use   Smoking status: Former    Current packs/day: 0.00    Types: Cigarettes    Quit date: 08/30/1983    Years since quitting: 40.8   Smokeless tobacco: Former   Tobacco comments:    Quit 30 years ago.  Vaping Use   Vaping status: Never Used  Substance and Sexual Activity   Alcohol use: Not Currently    Comment: rare   Drug use: No   Sexual activity: Yes  Other Topics Concern   Not on file  Social History Narrative   Not on file   Social Drivers of Health   Financial Resource Strain: Low Risk  (12/22/2022)   Overall Financial Resource Strain (CARDIA)    Difficulty of Paying Living Expenses: Not hard at all  Food Insecurity: No Food Insecurity (12/29/2023)   Hunger Vital Sign    Worried About Running Out of Food in the Last Year: Never true    Ran  Out of Food in the Last Year: Never true  Transportation Needs: No Transportation Needs (12/29/2023)   PRAPARE - Administrator, Civil Service (Medical): No    Lack of Transportation (Non-Medical): No  Physical Activity: Insufficiently Active (12/22/2022)   Exercise Vital Sign    Days of Exercise per Week: 3 days    Minutes of Exercise per Session: 30 min  Stress: No Stress Concern Present (12/22/2022)   [employer] of Occupational Health - Occupational Stress Questionnaire    Feeling of Stress : Not at all  Social Connections: Moderately Isolated (12/29/2023)   Social Connection and Isolation Panel    Frequency of Communication with Friends and Family: Once a week    Frequency of Social Gatherings with Friends and Family: Once a week    Attends Religious Services: Never    Database Administrator or Organizations: No    Attends Engineer, Structural: 1 to 4 times per year    Marital Status: Married  Catering Manager Violence: Not At Risk (12/29/2023)   Humiliation, Afraid, Rape, and Kick questionnaire    Fear of Current or Ex-Partner: No    Emotionally Abused: No    Physically Abused: No    Sexually Abused: No    Family History  Problem Relation Age of Onset   Lung cancer Father    Allergic rhinitis Father    Coronary artery disease Mother    Colon cancer Neg Hx    Heart attack Neg Hx    Hypertension Neg Hx    Stroke Neg Hx    Esophageal cancer Neg Hx    Rectal cancer Neg Hx    Stomach cancer Neg Hx    Pancreatic cancer Neg Hx     Review of Systems:  As stated in the HPI and otherwise negative.   BP 106/62   Pulse 69   Ht 5' 10.5 (1.791 m)   Wt 189 lb (85.7 kg)   SpO2 97%   BMI 26.74 kg/m   Physical Examination: General: Well developed, well nourished, NAD  HEENT: OP clear, mucus membranes moist  SKIN: warm, dry. No rashes. Neuro: No focal deficits  Musculoskeletal: Muscle strength 5/5 all ext  Psychiatric: Mood and affect normal  Neck: No  JVD, no carotid bruits, no thyromegaly, no lymphadenopathy.  Lungs:Clear bilaterally, no wheezes, rhonci, crackles Cardiovascular: Regular rate and rhythm. Loud, harsh, late peaking systolic murmur.  Abdomen:Soft. Bowel sounds present. Non-tender.  Extremities: No lower extremity edema. Pulses are 2 + in the bilateral DP/PT.  EKG:  EKG is ordered today. The ekg ordered today demonstrates EKG Interpretation Date/Time:  Monday July 01 2024 11:02:18 EST Ventricular Rate:  69 PR Interval:  128 QRS Duration:  192 QT Interval:  496 QTC Calculation: 531 R Axis:   241  Text Interpretation: Atrial-sensed ventricular-paced rhythm Confirmed by Verlin Bruckner [phone]) on 07/01/2024 11:24:04 AM    Echo 06/06/24:  1. Left ventricular ejection fraction, by estimation, is 25 to 30%. The  left ventricle has severely decreased function. The left ventricle  demonstrates global hypokinesis. There is severe concentric left  ventricular hypertrophy. Left ventricular  diastolic parameters are indeterminate.   2. Right ventricular systolic function is low normal. The right  ventricular size is normal. There is normal pulmonary artery systolic  pressure.  3. Left atrial size was mildly dilated.   4. Right atrial size was mildly dilated.   5. 2D MVA 4.06 cm2; calcified chords. The mitral valve is degenerative.  Mild mitral valve regurgitation. Severe mitral annular calcification.   6. Low stroke volume index with DVI 0.24; consistent with low flow low  gradient aortic stenosis. The aortic valve is calcified. Aortic valve  regurgitation is not visualized. Severe aortic valve stenosis. Aortic  valve area, by VTI measures 0.84 cm.   Comparison(s): Prior images reviewed side by side. Function and DVI are  worse from prior.   FINDINGS   Left Ventricle: Left ventricular ejection fraction, by estimation, is 25  to 30%. The left ventricle has severely decreased function. The left  ventricle  demonstrates global hypokinesis. The left ventricular internal  cavity size was normal in size. There  is severe concentric left ventricular hypertrophy. Left ventricular  diastolic parameters are indeterminate.   Right Ventricle: The right ventricular size is normal. No increase in  right ventricular wall thickness. Right ventricular systolic function is  low normal. There is normal pulmonary artery systolic pressure. The  tricuspid regurgitant velocity is 2.17 m/s,   and with an assumed right atrial pressure of 3 mmHg, the estimated right  ventricular systolic pressure is 21.8 mmHg.   Left Atrium: Left atrial size was mildly dilated.   Right Atrium: Right atrial size was mildly dilated.   Pericardium: There is no evidence of pericardial effusion.   Mitral Valve: 2D MVA 4.06 cm2; calcified chords. The mitral valve is  degenerative in appearance. Severe mitral annular calcification. Mild  mitral valve regurgitation.   Tricuspid Valve: The tricuspid valve is normal in structure. Tricuspid  valve regurgitation is mild . No evidence of tricuspid stenosis.   Aortic Valve: Low stroke volume index with DVI 0.24; consistent with low  flow low gradient aortic stenosis. The aortic valve is calcified. Aortic  valve regurgitation is not visualized. Severe aortic stenosis is present.  Aortic valve mean gradient  measures 13.0 mmHg. Aortic valve peak gradient measures 22.7 mmHg. Aortic  valve area, by VTI measures 0.84 cm.   Pulmonic Valve: The pulmonic valve was normal in structure. Pulmonic valve  regurgitation is trivial.   Aorta: The aortic root and ascending aorta are structurally normal, with  no evidence of dilitation.   IAS/Shunts: The atrial septum is grossly normal.     LEFT VENTRICLE  PLAX 2D  LVIDd:         4.60 cm   Diastology  LVIDs:         3.70 cm   LV e' medial:    4.90 cm/s  LV PW:         1.50 cm   LV E/e' medial:  23.6  LV IVS:        1.60 cm   LV e' lateral:    3.70 cm/s  LVOT diam:     2.10 cm   LV E/e' lateral: 31.2  LV SV:         42  LV SV Index:   21  LVOT Area:     3.46 cm  LV IVRT:       63 msec     RIGHT VENTRICLE  RV Basal diam:  3.70 cm    PULMONARY VEINS  RV S prime:     6.53 cm/s  Systolic Velocity: 80.30 cm/s  TAPSE (M-mode): 1.6 cm  RVSP:  21.8 mmHg   LEFT ATRIUM             Index        RIGHT ATRIUM           Index  LA diam:        4.30 cm 2.13 cm/m   RA Pressure: 3.00 mmHg  LA Vol (A2C):   71.2 ml 35.32 ml/m  RA Area:     21.90 cm  LA Vol (A4C):   64.8 ml 32.15 ml/m  RA Volume:   62.90 ml  31.21 ml/m  LA Biplane Vol: 68.8 ml 34.13 ml/m   AORTIC VALVE  AV Area (Vmax):    0.88 cm  AV Area (Vmean):   0.90 cm  AV Area (VTI):     0.84 cm  AV Vmax:           238.00 cm/s  AV Vmean:          169.000 cm/s  AV VTI:            0.492 m  AV Peak Grad:      22.7 mmHg  AV Mean Grad:      13.0 mmHg  LVOT Vmax:         60.70 cm/s  LVOT Vmean:        43.800 cm/s  LVOT VTI:          0.120 m  LVOT/AV VTI ratio: 0.24    AORTA  Ao Root diam: 3.20 cm  Ao Asc diam:  2.60 cm   MITRAL VALVE                TRICUSPID VALVE                              TR Peak grad:   18.8 mmHg  V Decel Time:               TR Vmax:        217.00 cm/s  MR Peak grad: 36.7 mmHg     Estimated RAP:  3.00 mmHg  MR Mean grad: 24.0 mmHg     RVSP:           21.8 mmHg  MR Vmax:      303.00 cm/s  MR Vmean:     226.0 cm/s    SHUNTS  MV E velocity: 115.50 cm/s  Systemic VTI:  0.12 m                              Systemic Diam: 2.10 cm   Recent Labs: 09/21/2023: Magnesium  1.7 12/22/2023: B Natriuretic Peptide 1,209.2 02/06/2024: ALT 25 06/10/2024: BUN 16; Creatinine, Ser 0.79; Hemoglobin 15.0; Platelets 160; Potassium 4.1; Sodium 134    Wt Readings from Last 3 Encounters:  07/01/24 189 lb (85.7 kg)  06/13/24 185 lb (83.9 kg)  06/12/24 185 lb 3.2 oz (84 kg)    Assessment and Plan:   1. Severe Aortic Valve Stenosis: He has severe, stage D2 low  flow/low gradient aortic valve stenosis. NYHA class 3 symptoms. I have personally reviewed the echo images. The aortic valve is thickened and calcified with limited leaflet mobility. I think he would benefit from AVR. Given advanced age, he is not a good candidate for conventional AVR by surgical approach. I think he may be a good candidate for TAVR.   I have reviewed the  natural history of aortic stenosis with the patient and their family members  who are present today. We have discussed the limitations of medical therapy and the poor prognosis associated with symptomatic aortic stenosis. We have reviewed potential treatment options, including palliative medical therapy, conventional surgical aortic valve replacement, and transcatheter aortic valve replacement. We discussed treatment options in the context of the patient's specific comorbid medical conditions.   He would like to proceed with planning for TAVR. I will arrange a left heart catheterization with possible PCI at [facility] 07/19/24 at 9am. Risks and benefits of the cath procedure and the valve procedure are reviewed with the patient. After the cath, he will have a cardiac CT, CTA of the chest/abdomen and pelvis and will then be referred to see one of the CT surgeons on our TAVR team. BMET and CBC today    Labs/ tests ordered today include:   Orders Placed This Encounter  Procedures   Basic Metabolic Panel (BMET)   CBC   EKG 12-Lead     Disposition:   F/U will be arranged with the structural team  Signed, [name], MD, [facility] 07/01/2024 1:30 PM    [facility] [address] [place], [place], [place]  [address] Phone: [phone]; Fax: [phone]

## 2024-07-01 NOTE — Progress Notes (Addendum)
 Pre Surgical Assessment: 5 M Walk Test  24M=16.49ft  5 Meter Walk Test- trial 1: 7.09 seconds 5 Meter Walk Test- trial 2: 7.09 seconds 5 Meter Walk Test- trial 3: 7.24 seconds 5 Meter Walk Test Average: 7.14 seconds  __________________________   Procedure Type: Isolated AVR Perioperative Outcome Estimate % Operative Mortality 5.62% Morbidity & Mortality 14% Stroke 2.25% Renal Failure 0.805% Reoperation 4.29% Prolonged Ventilation 10.2% Deep Sternal Wound Infection 0.117% Long Hospital Stay (>14 days) 7.38% Short Hospital Stay (<6 days)* 28.7%

## 2024-07-01 NOTE — H&P (View-Only) (Signed)
 [facility] Consult Note  Chief Complaint  Patient presents with   New Patient (Initial Visit)    Aortic stenosis   History of Present Illness: 78 yo male with history of CAD s/p 3V CABG, ischemic cardiomyopathy, chronic systolic CHF, VT s/p ablation with ICD in place, HTN, DM, HLD and severe aortic stenosis who is here today as a new consult, referred by Dr. Barbaraann, to discuss his aortic stenosis and possible TAVR. He had a NSTEMI in August 2020 and was found to have three vessel CAD. He underwent 3V CABG (LIMA to LAD, SVG to OM, SVG to RCA) in August 2020. Echo in 2020 with LVEF=25-30%. He was admitted with VT in 2021 with stable CAD on cath at that time and had a VT ablation and ICD placed. Echo in 2023 with LVEF=40-45%. Cardiac cath July 2024 following an abnormal Cardiac PET/CT with 3 patent grafts. Echo in October 2024 with LVEF=35-40% and moderate aortic stenosis. He has done well over the last year. He was seen in our office 04/30/24 by Lum Louis, NP and had no complaints with a pending parathyroid  surgery. Echo October 2025 with LVEF=25-30% with global hypokinesis. Severe MAC with mild MR.  Severe low flow, low gradient aortic stenosis with mean gradient 13 mmHg, AVA 0.84 cm2, SVI 24, DI 0.24. He has a nodule c/w parathyroid  adenoma. His parathyroid  surgery has been delayed with his valve issue.   He tells me today that he has no energy, progressive dyspnea but no chest pain. He feels lightheaded at times but no near syncope or syncope. No LE edema. He lives in [place], [place] with his wife. He is retired from aeronautical engineer. He has full dental implants.   Primary Care Physician: Duanne Butler DASEN, MD Primary Cardiologist: Barbaraann Referring Cardiologist: Barbaraann Advanced Heart Failure: Bensimhon EP: Cindie  Past Medical History:  Diagnosis Date   AICD (automatic cardioverter/defibrillator) present    Allergic rhinitis, cause unspecified    Allergy     Anxiety     Arthritis    Cancer [facility])    a couple places removed from eyelid   Carotid Doppler 04/2020   Carotid US  9/21: No evidence of ICA stenosis bilaterally; right vertebral artery stenosis   Cataract    Chronic combined systolic (congestive) and diastolic (congestive) heart failure (HCC)    Coronary atherosclerosis of unspecified type of vessel, native or graft    a. MI 1985 with unclear details. b. CABG 03/2019 with LIMA -> LAD, SVG -> OM, SVG to dRCA.   Depression    PTSD    Diverticulosis of colon (without mention of hemorrhage)    Esophageal reflux    Esophageal stricture    Essential hypertension    Former tobacco use    Hemorrhoids    Hyperlipidemia    ICD (implantable cardioverter-defibrillator) in place    Irritable bowel syndrome    Ischemic cardiomyopathy    Mild carotid artery disease    a. 1-39% bilaterally 03/2019 duplex.   Myocardial infarction (HCC)    Nocturia    Obesity, unspecified    Orthostasis    Other acquired absence of organ    Personal history of colonic polyps    Psychosexual dysfunction with inhibited sexual excitement    PTSD (post-traumatic stress disorder)    Retroperitoneal bleed    Type II or unspecified type diabetes mellitus without mention of complication, not stated as uncontrolled    VT (ventricular tachycardia) (HCC)     Past Surgical History:  Procedure Laterality Date   ADENOIDECTOMY     CARPAL TUNNEL RELEASE     left    CHOLECYSTECTOMY     COLON RESECTION     18 inches   COLON SURGERY  2013   CORONARY ANGIOPLASTY     1985    CORONARY ARTERY BYPASS GRAFT N/A 04/26/2019   Procedure: CORONARY ARTERY BYPASS GRAFTING (CABG) x Three, using left internal mammary artery and right leg greater saphenous vein harvested endoscopically;  Surgeon: Lucas Dorise POUR, MD;  Location: [facility] OR;  Service: Open Heart Surgery;  Laterality: N/A;   ELBOW BURSA SURGERY     EYE SURGERY     ICD GENERATOR CHANGEOUT N/A 12/28/2023   Procedure: ICD GENERATOR  CHANGEOUT;  Surgeon: Cindie Ole DASEN, MD;  Location: [facility] INVASIVE CV LAB;  Service: Cardiovascular;  Laterality: N/A;   ICD IMPLANT N/A 06/25/2020   Procedure: ICD IMPLANT;  Surgeon: Cindie Ole DASEN, MD;  Location: [facility] INVASIVE CV LAB;  Service: Cardiovascular;  Laterality: N/A;   LEAD INSERTION N/A 12/28/2023   Procedure: LEAD INSERTION;  Surgeon: Cindie Ole DASEN, MD;  Location: [facility] INVASIVE CV LAB;  Service: Cardiovascular;  Laterality: N/A;   LEFT HEART CATH AND CORS/GRAFTS ANGIOGRAPHY N/A 06/19/2020   Procedure: LEFT HEART CATH AND CORS/GRAFTS ANGIOGRAPHY;  Surgeon: Burnard Debby LABOR, MD;  Location: [facility] INVASIVE CV LAB;  Service: Cardiovascular;  Laterality: N/A;   LEFT HEART CATH AND CORS/GRAFTS ANGIOGRAPHY N/A 03/08/2023   Procedure: LEFT HEART CATH AND CORS/GRAFTS ANGIOGRAPHY;  Surgeon: Darron Deatrice LABOR, MD;  Location: [facility] INVASIVE CV LAB;  Service: Cardiovascular;  Laterality: N/A;   POLYPECTOMY     RIGHT/LEFT HEART CATH AND CORONARY ANGIOGRAPHY N/A 04/16/2019   Procedure: RIGHT/LEFT HEART CATH AND CORONARY ANGIOGRAPHY;  Surgeon: Claudene Victory ORN, MD;  Location: [facility] INVASIVE CV LAB;  Service: Cardiovascular;  Laterality: N/A;   SHOULDER SURGERY     Rt. Shoulder   TEE WITHOUT CARDIOVERSION N/A 04/26/2019   Procedure: TRANSESOPHAGEAL ECHOCARDIOGRAM (TEE);  Surgeon: Lucas Dorise POUR, MD;  Location: [facility] OR;  Service: Open Heart Surgery;  Laterality: N/A;   TONSILLECTOMY     V TACH ABLATION N/A 06/22/2020   Procedure: V TACH ABLATION;  Surgeon: Cindie Ole DASEN, MD;  Location: [facility] INVASIVE CV LAB;  Service: Cardiovascular;  Laterality: N/A;    Current Outpatient Medications  Medication Sig Dispense Refill   acetaminophen  (TYLENOL ) 500 MG tablet Take 1,000 mg by mouth every 6 (six) hours as needed for mild pain (pain score 1-3).     aspirin  EC 81 MG tablet Take 1 tablet (81 mg total) by mouth daily. 90 tablet 3   Azelastine  HCl 137 MCG/SPRAY SOLN PLACE 1 SPRAY INTO BOTH NOSTRILS 2 (TWO) TIMES  DAILY DIRECTED 30 mL 1   Cholecalciferol (VITAMIN D -3 PO) Take 1 tablet by mouth in the morning.     Cholecalciferol (VITAMIN D3 PO) Take by mouth.     cholestyramine  (QUESTRAN ) 4 g packet Take 0.5 packets (2 g total) by mouth 2 (two) times daily. 60 each 3   citalopram  (CELEXA ) 10 MG tablet Take 1 tablet (10 mg total) by mouth daily. 90 tablet 3   clotrimazole  (LOTRIMIN ) 1 % cream Apply 1 Application topically 2 (two) times daily. 30 g 2   diphenoxylate -atropine  (LOMOTIL ) 2.5-0.025 MG tablet Take 2 tablets by mouth 4 (four) times daily as needed for diarrhea or loose stools. 30 tablet 0   empagliflozin  (JARDIANCE ) 25 MG TABS tablet TAKE 25 MG (1 TABLET) BY MOUTH  DAILY BEFORE BREAKFAST. STOP PIOGLITAZONE  90 tablet 0   fexofenadine  (ALLEGRA ) 180 MG tablet Take 180 mg by mouth in the morning.     finasteride  (PROSCAR ) 5 MG tablet TAKE 1 TABLET BY MOUTH EVERY DAY 90 tablet 3   hydrocortisone  2.5 % cream Apply topically 2 (two) times daily. 30 g 0   MAGNESIUM  PO Take 1 tablet by mouth in the morning.     metoprolol  succinate (TOPROL  XL) 25 MG 24 hr tablet Take 0.5 tablets (12.5 mg total) by mouth daily. 90 tablet 3   montelukast  (SINGULAIR ) 10 MG tablet Take 1 tablet (10 mg total) by mouth at bedtime. 90 tablet 0   nitroGLYCERIN  (NITROSTAT ) 0.4 MG SL tablet Place 1 tablet (0.4 mg total) under the tongue every 5 (five) minutes x 3 doses as needed for chest pain. 25 tablet 12   olopatadine  (PATADAY ) 0.1 % ophthalmic solution Place 1 drop into both eyes 2 (two) times daily as needed for allergies.     pantoprazole  (PROTONIX ) 40 MG tablet TAKE 1 TABLET BY MOUTH TWICE A DAY 180 tablet 3   rosuvastatin  (CRESTOR ) 40 MG tablet Take 1 tablet (40 mg total) by mouth daily. 90 tablet 3   sitaGLIPtin  (JANUVIA ) 100 MG tablet Take 100 mg by mouth in the morning.     spironolactone  (ALDACTONE ) 25 MG tablet TAKE 1/2 TABLET BY MOUTH EVERY DAY 45 tablet 3   No current facility-administered medications for this visit.     Allergies  Allergen Reactions   Doxazosin Mesylate Other (See Comments)    Drops in blood pressure    Losartan  Other (See Comments)    muscle pain   Testosterone  Other (See Comments)     Erythrocytosis    Flomax  [Tamsulosin  Hcl] Itching and Other (See Comments)    Drops in blood pressure     Penicillins Rash and Other (See Comments)    Has patient had a PCN reaction causing immediate rash, facial/tongue/throat swelling, SOB or lightheadedness with hypotension: No Has patient had a PCN reaction causing severe rash involving mucus membranes or skin necrosis: No Has patient had a PCN reaction that required hospitalization No Has patient had a PCN reaction occurring within the last 10 years: No If all of the above answers are NO, then may proceed with Cephalosporin use.    Sulfonamide Derivatives Rash   Trulicity  [Dulaglutide ] Nausea And Vomiting    N&V, Dizziness    Social History   Socioeconomic History   Marital status: Married    Spouse name: Not on file   Number of children: 1   Years of education: 12   Highest education level: High school graduate  Occupational History   Occupation: Therapist, Sports- retired  Tobacco Use   Smoking status: Former    Current packs/day: 0.00    Types: Cigarettes    Quit date: 08/30/1983    Years since quitting: 40.8   Smokeless tobacco: Former   Tobacco comments:    Quit 30 years ago.  Vaping Use   Vaping status: Never Used  Substance and Sexual Activity   Alcohol use: Not Currently    Comment: rare   Drug use: No   Sexual activity: Yes  Other Topics Concern   Not on file  Social History Narrative   Not on file   Social Drivers of Health   Financial Resource Strain: Low Risk  (12/22/2022)   Overall Financial Resource Strain (CARDIA)    Difficulty of Paying Living Expenses: Not hard at all  Food Insecurity: No Food Insecurity (12/29/2023)   Hunger Vital Sign    Worried About Running Out of Food in the Last Year: Never true    Ran  Out of Food in the Last Year: Never true  Transportation Needs: No Transportation Needs (12/29/2023)   PRAPARE - Administrator, Civil Service (Medical): No    Lack of Transportation (Non-Medical): No  Physical Activity: Insufficiently Active (12/22/2022)   Exercise Vital Sign    Days of Exercise per Week: 3 days    Minutes of Exercise per Session: 30 min  Stress: No Stress Concern Present (12/22/2022)   [employer] of Occupational Health - Occupational Stress Questionnaire    Feeling of Stress : Not at all  Social Connections: Moderately Isolated (12/29/2023)   Social Connection and Isolation Panel    Frequency of Communication with Friends and Family: Once a week    Frequency of Social Gatherings with Friends and Family: Once a week    Attends Religious Services: Never    Database Administrator or Organizations: No    Attends Engineer, Structural: 1 to 4 times per year    Marital Status: Married  Catering Manager Violence: Not At Risk (12/29/2023)   Humiliation, Afraid, Rape, and Kick questionnaire    Fear of Current or Ex-Partner: No    Emotionally Abused: No    Physically Abused: No    Sexually Abused: No    Family History  Problem Relation Age of Onset   Lung cancer Father    Allergic rhinitis Father    Coronary artery disease Mother    Colon cancer Neg Hx    Heart attack Neg Hx    Hypertension Neg Hx    Stroke Neg Hx    Esophageal cancer Neg Hx    Rectal cancer Neg Hx    Stomach cancer Neg Hx    Pancreatic cancer Neg Hx     Review of Systems:  As stated in the HPI and otherwise negative.   BP 106/62   Pulse 69   Ht 5' 10.5 (1.791 m)   Wt 189 lb (85.7 kg)   SpO2 97%   BMI 26.74 kg/m   Physical Examination: General: Well developed, well nourished, NAD  HEENT: OP clear, mucus membranes moist  SKIN: warm, dry. No rashes. Neuro: No focal deficits  Musculoskeletal: Muscle strength 5/5 all ext  Psychiatric: Mood and affect normal  Neck: No  JVD, no carotid bruits, no thyromegaly, no lymphadenopathy.  Lungs:Clear bilaterally, no wheezes, rhonci, crackles Cardiovascular: Regular rate and rhythm. Loud, harsh, late peaking systolic murmur.  Abdomen:Soft. Bowel sounds present. Non-tender.  Extremities: No lower extremity edema. Pulses are 2 + in the bilateral DP/PT.  EKG:  EKG is ordered today. The ekg ordered today demonstrates EKG Interpretation Date/Time:  Monday July 01 2024 11:02:18 EST Ventricular Rate:  69 PR Interval:  128 QRS Duration:  192 QT Interval:  496 QTC Calculation: 531 R Axis:   241  Text Interpretation: Atrial-sensed ventricular-paced rhythm Confirmed by Verlin Bruckner [phone]) on 07/01/2024 11:24:04 AM    Echo 06/06/24:  1. Left ventricular ejection fraction, by estimation, is 25 to 30%. The  left ventricle has severely decreased function. The left ventricle  demonstrates global hypokinesis. There is severe concentric left  ventricular hypertrophy. Left ventricular  diastolic parameters are indeterminate.   2. Right ventricular systolic function is low normal. The right  ventricular size is normal. There is normal pulmonary artery systolic  pressure.  3. Left atrial size was mildly dilated.   4. Right atrial size was mildly dilated.   5. 2D MVA 4.06 cm2; calcified chords. The mitral valve is degenerative.  Mild mitral valve regurgitation. Severe mitral annular calcification.   6. Low stroke volume index with DVI 0.24; consistent with low flow low  gradient aortic stenosis. The aortic valve is calcified. Aortic valve  regurgitation is not visualized. Severe aortic valve stenosis. Aortic  valve area, by VTI measures 0.84 cm.   Comparison(s): Prior images reviewed side by side. Function and DVI are  worse from prior.   FINDINGS   Left Ventricle: Left ventricular ejection fraction, by estimation, is 25  to 30%. The left ventricle has severely decreased function. The left  ventricle  demonstrates global hypokinesis. The left ventricular internal  cavity size was normal in size. There  is severe concentric left ventricular hypertrophy. Left ventricular  diastolic parameters are indeterminate.   Right Ventricle: The right ventricular size is normal. No increase in  right ventricular wall thickness. Right ventricular systolic function is  low normal. There is normal pulmonary artery systolic pressure. The  tricuspid regurgitant velocity is 2.17 m/s,   and with an assumed right atrial pressure of 3 mmHg, the estimated right  ventricular systolic pressure is 21.8 mmHg.   Left Atrium: Left atrial size was mildly dilated.   Right Atrium: Right atrial size was mildly dilated.   Pericardium: There is no evidence of pericardial effusion.   Mitral Valve: 2D MVA 4.06 cm2; calcified chords. The mitral valve is  degenerative in appearance. Severe mitral annular calcification. Mild  mitral valve regurgitation.   Tricuspid Valve: The tricuspid valve is normal in structure. Tricuspid  valve regurgitation is mild . No evidence of tricuspid stenosis.   Aortic Valve: Low stroke volume index with DVI 0.24; consistent with low  flow low gradient aortic stenosis. The aortic valve is calcified. Aortic  valve regurgitation is not visualized. Severe aortic stenosis is present.  Aortic valve mean gradient  measures 13.0 mmHg. Aortic valve peak gradient measures 22.7 mmHg. Aortic  valve area, by VTI measures 0.84 cm.   Pulmonic Valve: The pulmonic valve was normal in structure. Pulmonic valve  regurgitation is trivial.   Aorta: The aortic root and ascending aorta are structurally normal, with  no evidence of dilitation.   IAS/Shunts: The atrial septum is grossly normal.     LEFT VENTRICLE  PLAX 2D  LVIDd:         4.60 cm   Diastology  LVIDs:         3.70 cm   LV e' medial:    4.90 cm/s  LV PW:         1.50 cm   LV E/e' medial:  23.6  LV IVS:        1.60 cm   LV e' lateral:    3.70 cm/s  LVOT diam:     2.10 cm   LV E/e' lateral: 31.2  LV SV:         42  LV SV Index:   21  LVOT Area:     3.46 cm  LV IVRT:       63 msec     RIGHT VENTRICLE  RV Basal diam:  3.70 cm    PULMONARY VEINS  RV S prime:     6.53 cm/s  Systolic Velocity: 80.30 cm/s  TAPSE (M-mode): 1.6 cm  RVSP:  21.8 mmHg   LEFT ATRIUM             Index        RIGHT ATRIUM           Index  LA diam:        4.30 cm 2.13 cm/m   RA Pressure: 3.00 mmHg  LA Vol (A2C):   71.2 ml 35.32 ml/m  RA Area:     21.90 cm  LA Vol (A4C):   64.8 ml 32.15 ml/m  RA Volume:   62.90 ml  31.21 ml/m  LA Biplane Vol: 68.8 ml 34.13 ml/m   AORTIC VALVE  AV Area (Vmax):    0.88 cm  AV Area (Vmean):   0.90 cm  AV Area (VTI):     0.84 cm  AV Vmax:           238.00 cm/s  AV Vmean:          169.000 cm/s  AV VTI:            0.492 m  AV Peak Grad:      22.7 mmHg  AV Mean Grad:      13.0 mmHg  LVOT Vmax:         60.70 cm/s  LVOT Vmean:        43.800 cm/s  LVOT VTI:          0.120 m  LVOT/AV VTI ratio: 0.24    AORTA  Ao Root diam: 3.20 cm  Ao Asc diam:  2.60 cm   MITRAL VALVE                TRICUSPID VALVE                              TR Peak grad:   18.8 mmHg  V Decel Time:               TR Vmax:        217.00 cm/s  MR Peak grad: 36.7 mmHg     Estimated RAP:  3.00 mmHg  MR Mean grad: 24.0 mmHg     RVSP:           21.8 mmHg  MR Vmax:      303.00 cm/s  MR Vmean:     226.0 cm/s    SHUNTS  MV E velocity: 115.50 cm/s  Systemic VTI:  0.12 m                              Systemic Diam: 2.10 cm   Recent Labs: 09/21/2023: Magnesium  1.7 12/22/2023: B Natriuretic Peptide 1,209.2 02/06/2024: ALT 25 06/10/2024: BUN 16; Creatinine, Ser 0.79; Hemoglobin 15.0; Platelets 160; Potassium 4.1; Sodium 134    Wt Readings from Last 3 Encounters:  07/01/24 189 lb (85.7 kg)  06/13/24 185 lb (83.9 kg)  06/12/24 185 lb 3.2 oz (84 kg)    Assessment and Plan:   1. Severe Aortic Valve Stenosis: He has severe, stage D2 low  flow/low gradient aortic valve stenosis. NYHA class 3 symptoms. I have personally reviewed the echo images. The aortic valve is thickened and calcified with limited leaflet mobility. I think he would benefit from AVR. Given advanced age, he is not a good candidate for conventional AVR by surgical approach. I think he may be a good candidate for TAVR.   I have reviewed the  natural history of aortic stenosis with the patient and their family members  who are present today. We have discussed the limitations of medical therapy and the poor prognosis associated with symptomatic aortic stenosis. We have reviewed potential treatment options, including palliative medical therapy, conventional surgical aortic valve replacement, and transcatheter aortic valve replacement. We discussed treatment options in the context of the patient's specific comorbid medical conditions.   He would like to proceed with planning for TAVR. I will arrange a left heart catheterization with possible PCI at [facility] 07/19/24 at 9am. Risks and benefits of the cath procedure and the valve procedure are reviewed with the patient. After the cath, he will have a cardiac CT, CTA of the chest/abdomen and pelvis and will then be referred to see one of the CT surgeons on our TAVR team. BMET and CBC today    Labs/ tests ordered today include:   Orders Placed This Encounter  Procedures   Basic Metabolic Panel (BMET)   CBC   EKG 12-Lead     Disposition:   F/U will be arranged with the structural team  Signed, [name], MD, [facility] 07/01/2024 1:30 PM    [facility] [address] [place], [place], [place]  [address] Phone: [phone]; Fax: [phone]

## 2024-07-01 NOTE — Patient Instructions (Signed)
 Medication Instructions:  Your physician recommends that you continue on your current medications as directed. Please refer to the Current Medication list given to you today.  *If you need a refill on your cardiac medications before your next appointment, please call your pharmacy*  Lab Work: Have lab work done in the lab on the first floor today--BMP and CBC If you have labs (blood work) drawn today and your tests are completely normal, you will receive your results only by: MyChart Message (if you have MyChart) OR A paper copy in the mail If you have any lab test that is abnormal or we need to change your treatment, we will call you to review the results.  Testing/Procedures: Your physician has requested that you have a cardiac catheterization. Cardiac catheterization is used to diagnose and/or treat various heart conditions. Doctors may recommend this procedure for a number of different reasons. The most common reason is to evaluate chest pain. Chest pain can be a symptom of coronary artery disease (CAD), and cardiac catheterization can show whether plaque is narrowing or blocking your heart's arteries. This procedure is also used to evaluate the valves, as well as measure the blood flow and oxygen levels in different parts of your heart. For further information please visit [email or web address] Please follow instruction sheet, as given. Scheduled for November 21  Follow-Up: At [facility], you and your health needs are our priority.  As part of our continuing mission to provide you with exceptional heart care, our providers are all part of one team.  This team includes your primary Cardiologist (physician) and Advanced Practice Providers or APPs (Physician Assistants and Nurse Practitioners) who all work together to provide you with the care you need, when you need it.  Your next appointment:   To be determined after procedure  Provider:   [name], MD    We  recommend signing up for the patient portal called MyChart.  Sign up information is provided on this After Visit Summary.  MyChart is used to connect with patients for Virtual Visits (Telemedicine).  Patients are able to view lab/test results, encounter notes, upcoming appointments, etc.  Non-urgent messages can be sent to your provider as well.   To learn more about what you can do with MyChart, go to [email or web address].   Other Instructions   [place] [facility] A DEPT OF [place] AT [address] A DEPT OF THE [place]. [facility] [address] [place] [address] Dept: [phone] Loc: [phone]  MONTEZ CUDA  07/01/2024  You are scheduled for a Cardiac Catheterization on Friday, November 21 with Dr. [name].  1. Please arrive at the [facility] (Main Entrance A) at [facility]: [address] [place], [place] [address] at 7:00 AM (This time is 2 hour(s) before your procedure to ensure your preparation).   Free valet parking service is available. You will check in at ADMITTING. The support person will be asked to wait in the waiting room.  It is OK to have someone drop you off and come back when you are ready to be discharged.    Special note: Every effort is made to have your procedure done on time. Please understand that emergencies sometimes delay scheduled procedures.  2. Diet: Nothing to eat after midnight.   3. Hydration: You need to be well hydrated before your procedure. On November 21, you may drink approved liquids (see below) until 2 hours before the procedure,  with 16 oz of water  as your last intake.   List of approved liquids water , clear juice, clear tea, black coffee, fruit juices, non-citric and without pulp, carbonated beverages, Gatorade, Kool -Aid, plain Jello-O and plain ice popsicles.  4. Labs: done in office on 11/3   5. Medication instructions in preparation for your procedure:  Do  not take Jardiance , Januvia , Aldactone  the morning of the procedure  Do not take any diabetes medication the morning of the procedure   Contrast Allergy : No   On the morning of your procedure, take your Aspirin  81 mg and any morning medicines NOT listed above.  You may use sips of water .  6. Plan to go home the same day, you will only stay overnight if medically necessary. 7. Bring a current list of your medications and current insurance cards. 8. You MUST have a responsible person to drive you home. 9. Someone MUST be with you the first 24 hours after you arrive home or your discharge will be delayed. 10. Please wear clothes that are easy to get on and off and wear slip-on shoes.  Thank you for allowing us  to care for you!   -- [place] [facility]

## 2024-07-02 ENCOUNTER — Ambulatory Visit: Payer: Self-pay | Admitting: Cardiovascular Disease

## 2024-07-02 LAB — CUP PACEART REMOTE DEVICE CHECK
Battery Remaining Longevity: 88 mo
Battery Voltage: 2.99 V
Brady Statistic AP VP Percent: 7.55 %
Brady Statistic AP VS Percent: 0.01 %
Brady Statistic AS VP Percent: 91.92 %
Brady Statistic AS VS Percent: 0.53 %
Brady Statistic RA Percent Paced: 7.98 %
Brady Statistic RV Percent Paced: 99.46 %
Date Time Interrogation Session: 20251103004351
HighPow Impedance: 58 Ohm
Implantable Lead Connection Status: 753985
Implantable Lead Connection Status: 753985
Implantable Lead Connection Status: 753985
Implantable Lead Implant Date: 20211028
Implantable Lead Implant Date: 20250501
Implantable Lead Implant Date: 20250501
Implantable Lead Location: 753858
Implantable Lead Location: 753859
Implantable Lead Location: 753860
Implantable Lead Model: 4598
Implantable Lead Model: 5076
Implantable Pulse Generator Implant Date: 20250501
Lead Channel Impedance Value: 342 Ohm
Lead Channel Impedance Value: 361 Ohm
Lead Channel Impedance Value: 418 Ohm
Lead Channel Impedance Value: 437 Ohm
Lead Channel Impedance Value: 494 Ohm
Lead Channel Impedance Value: 532 Ohm
Lead Channel Impedance Value: 570 Ohm
Lead Channel Impedance Value: 627 Ohm
Lead Channel Impedance Value: 722 Ohm
Lead Channel Impedance Value: 760 Ohm
Lead Channel Impedance Value: 798 Ohm
Lead Channel Impedance Value: 893 Ohm
Lead Channel Impedance Value: 950 Ohm
Lead Channel Pacing Threshold Amplitude: 0.625 V
Lead Channel Pacing Threshold Amplitude: 0.625 V
Lead Channel Pacing Threshold Amplitude: 1.125 V
Lead Channel Pacing Threshold Pulse Width: 0.4 ms
Lead Channel Pacing Threshold Pulse Width: 0.4 ms
Lead Channel Pacing Threshold Pulse Width: 0.8 ms
Lead Channel Sensing Intrinsic Amplitude: 2 mV
Lead Channel Setting Pacing Amplitude: 1.5 V
Lead Channel Setting Pacing Amplitude: 2 V
Lead Channel Setting Pacing Amplitude: 2.25 V
Lead Channel Setting Pacing Pulse Width: 0.4 ms
Lead Channel Setting Pacing Pulse Width: 0.8 ms
Lead Channel Setting Sensing Sensitivity: 0.3 mV
Zone Setting Status: 755011
Zone Setting Status: 755011

## 2024-07-02 LAB — BASIC METABOLIC PANEL WITH GFR
BUN/Creatinine Ratio: 18 (ref 10–24)
BUN: 14 mg/dL (ref 8–27)
CO2: 22 mmol/L (ref 20–29)
Calcium: 10.9 mg/dL — ABNORMAL HIGH (ref 8.6–10.2)
Chloride: 99 mmol/L (ref 96–106)
Creatinine, Ser: 0.77 mg/dL (ref 0.76–1.27)
Glucose: 119 mg/dL — ABNORMAL HIGH (ref 70–99)
Potassium: 4.1 mmol/L (ref 3.5–5.2)
Sodium: 136 mmol/L (ref 134–144)
eGFR: 92 mL/min/1.73 (ref >59)

## 2024-07-02 LAB — CBC
Hematocrit: 46.1 % (ref 37.5–51.0)
Hemoglobin: 14.5 g/dL (ref 13.0–17.7)
MCH: 27.8 pg (ref 26.6–33.0)
MCHC: 31.5 g/dL (ref 31.5–35.7)
MCV: 89 fL (ref 79–97)
Platelets: 124 x10E3/uL — ABNORMAL LOW (ref 150–450)
RBC: 5.21 x10E6/uL (ref 4.14–5.80)
RDW: 14.1 % (ref 11.6–15.4)
WBC: 6.8 x10E3/uL (ref 3.4–10.8)

## 2024-07-03 NOTE — Progress Notes (Signed)
 Remote ICD Transmission

## 2024-07-08 ENCOUNTER — Ambulatory Visit: Payer: Self-pay | Admitting: Cardiology

## 2024-07-16 ENCOUNTER — Telehealth: Payer: Self-pay | Admitting: *Deleted

## 2024-07-16 NOTE — Telephone Encounter (Signed)
 Cardiac Catheterization scheduled at [facility] for: Friday July 19, 2024 9 AM Arrival time [facility] Main Entrance A at: 7 AM  Diet: -Nothing to eat after midnight.  Hydration: -May drink clear liquids until 2 hours before the procedure.  Approved liquids: Water , clear tea, black coffee, fruit juices-non-citric and without pulp,Gatorade, plain Jello/popsicles.   -Please drink 8 oz of water  2 hours before procedure.  Medication instructions: -Hold:  Jardiance /Januvia /Spironolactone -AM of procedure -Other usual morning medications can be taken including aspirin  81 mg.  Plan to go home the same day, you will only stay overnight if medically necessary.  You must have responsible adult to drive you home.  Someone must be with you the first 24 hours after you arrive home.  Reviewed procedure instructions with patient.

## 2024-07-19 ENCOUNTER — Other Ambulatory Visit: Payer: Self-pay

## 2024-07-19 ENCOUNTER — Ambulatory Visit (HOSPITAL_COMMUNITY)
Admission: RE | Admit: 2024-07-19 | Discharge: 2024-07-19 | Disposition: A | Discharge status: Home / Self Care | Attending: Cardiovascular Disease | Admitting: Cardiovascular Disease

## 2024-07-19 ENCOUNTER — Encounter (HOSPITAL_COMMUNITY)
Admission: RE | Disposition: A | Discharge status: Home / Self Care | Payer: Self-pay | Source: Home / Self Care | Attending: Cardiovascular Disease

## 2024-07-19 DIAGNOSIS — Z7982 Long term (current) use of aspirin: Secondary | ICD-10-CM | POA: Diagnosis not present

## 2024-07-19 DIAGNOSIS — I2582 Chronic total occlusion of coronary artery: Secondary | ICD-10-CM | POA: Insufficient documentation

## 2024-07-19 DIAGNOSIS — Z7984 Long term (current) use of oral hypoglycemic drugs: Secondary | ICD-10-CM | POA: Insufficient documentation

## 2024-07-19 DIAGNOSIS — Z951 Presence of aortocoronary bypass graft: Secondary | ICD-10-CM | POA: Insufficient documentation

## 2024-07-19 DIAGNOSIS — Z95810 Presence of automatic (implantable) cardiac defibrillator: Secondary | ICD-10-CM | POA: Insufficient documentation

## 2024-07-19 DIAGNOSIS — I2510 Atherosclerotic heart disease of native coronary artery without angina pectoris: Secondary | ICD-10-CM | POA: Insufficient documentation

## 2024-07-19 DIAGNOSIS — I5022 Chronic systolic (congestive) heart failure: Secondary | ICD-10-CM | POA: Diagnosis not present

## 2024-07-19 DIAGNOSIS — E119 Type 2 diabetes mellitus without complications: Secondary | ICD-10-CM | POA: Diagnosis not present

## 2024-07-19 DIAGNOSIS — I255 Ischemic cardiomyopathy: Secondary | ICD-10-CM | POA: Diagnosis not present

## 2024-07-19 DIAGNOSIS — I350 Nonrheumatic aortic (valve) stenosis: Secondary | ICD-10-CM

## 2024-07-19 DIAGNOSIS — Z87891 Personal history of nicotine dependence: Secondary | ICD-10-CM | POA: Insufficient documentation

## 2024-07-19 DIAGNOSIS — E785 Hyperlipidemia, unspecified: Secondary | ICD-10-CM | POA: Diagnosis not present

## 2024-07-19 DIAGNOSIS — I110 Hypertensive heart disease with heart failure: Secondary | ICD-10-CM | POA: Insufficient documentation

## 2024-07-19 DIAGNOSIS — D351 Benign neoplasm of parathyroid gland: Secondary | ICD-10-CM | POA: Insufficient documentation

## 2024-07-19 DIAGNOSIS — Z79899 Other long term (current) drug therapy: Secondary | ICD-10-CM | POA: Insufficient documentation

## 2024-07-19 DIAGNOSIS — I082 Rheumatic disorders of both aortic and tricuspid valves: Secondary | ICD-10-CM | POA: Insufficient documentation

## 2024-07-19 DIAGNOSIS — I252 Old myocardial infarction: Secondary | ICD-10-CM | POA: Insufficient documentation

## 2024-07-19 HISTORY — PX: CORONARY/GRAFT ANGIOGRAPHY: CATH118237

## 2024-07-19 LAB — GLUCOSE, CAPILLARY: Glucose-Capillary: 111 mg/dL — ABNORMAL HIGH (ref 70–99)

## 2024-07-19 SURGERY — CORONARY/GRAFT ANGIOGRAPHY
Anesthesia: LOCAL

## 2024-07-19 MED ORDER — SODIUM CHLORIDE 0.9% FLUSH: 3.0000 mL | Freq: Two times a day (BID) | INTRAVENOUS | Status: DC

## 2024-07-19 MED ORDER — MIDAZOLAM HCL 2 MG/2ML IJ SOLN
INTRAMUSCULAR | Status: AC
  Filled 2024-07-19: qty 2

## 2024-07-19 MED ORDER — SODIUM CHLORIDE 0.9% FLUSH: 3.0000 mL | INTRAVENOUS | Status: DC | PRN

## 2024-07-19 MED ORDER — MIDAZOLAM HCL (PF) 2 MG/2ML IJ SOLN
INTRAMUSCULAR | Status: DC | PRN
  Administered 2024-07-19: 1 mg via INTRAVENOUS

## 2024-07-19 MED ORDER — HEPARIN (PORCINE) IN NACL 1000-0.9 UT/500ML-% IV SOLN
INTRAVENOUS | Status: DC | PRN
  Administered 2024-07-19 (×2): 500 mL

## 2024-07-19 MED ORDER — FENTANYL CITRATE (PF) 100 MCG/2ML IJ SOLN
INTRAMUSCULAR | Status: DC | PRN
  Administered 2024-07-19: 25 ug via INTRAVENOUS

## 2024-07-19 MED ORDER — SODIUM CHLORIDE 0.9 % IV SOLN: 250.0000 mL | INTRAVENOUS | Status: DC | PRN

## 2024-07-19 MED ORDER — LABETALOL HCL 5 MG/ML IV SOLN
10.0000 mg | INTRAVENOUS | Status: DC | PRN
Start: 2024-07-19 — End: 2024-07-19

## 2024-07-19 MED ORDER — FREE WATER: 500.0000 mL | Freq: Once | Status: DC

## 2024-07-19 MED ORDER — ONDANSETRON HCL 4 MG/2ML IJ SOLN
4.0000 mg | Freq: Four times a day (QID) | INTRAMUSCULAR | Status: DC | PRN
Start: 2024-07-19 — End: 2024-07-19

## 2024-07-19 MED ORDER — ACETAMINOPHEN 325 MG PO TABS: 650.0000 mg | ORAL_TABLET | ORAL | Status: DC | PRN

## 2024-07-19 MED ORDER — LIDOCAINE HCL (PF) 1 % IJ SOLN
INTRAMUSCULAR | Status: AC
  Filled 2024-07-19: qty 30

## 2024-07-19 MED ORDER — HYDRALAZINE HCL 20 MG/ML IJ SOLN: 10.0000 mg | INTRAMUSCULAR | Status: DC | PRN

## 2024-07-19 MED ORDER — ASPIRIN 81 MG PO CHEW: 81.0000 mg | CHEWABLE_TABLET | ORAL | Status: DC

## 2024-07-19 MED ORDER — IOHEXOL 350 MG/ML SOLN
INTRAVENOUS | Status: DC | PRN
  Administered 2024-07-19: 40 mL

## 2024-07-19 MED ORDER — LIDOCAINE HCL (PF) 1 % IJ SOLN
INTRAMUSCULAR | Status: DC | PRN
  Administered 2024-07-19: 2 mL

## 2024-07-19 MED ORDER — HEPARIN SODIUM (PORCINE) 1000 UNIT/ML IJ SOLN
INTRAMUSCULAR | Status: DC | PRN
  Administered 2024-07-19: 4000 [IU] via INTRAVENOUS

## 2024-07-19 MED ORDER — FENTANYL CITRATE (PF) 100 MCG/2ML IJ SOLN
INTRAMUSCULAR | Status: AC
  Filled 2024-07-19: qty 2

## 2024-07-19 MED ORDER — VERAPAMIL HCL 2.5 MG/ML IV SOLN
INTRAVENOUS | Status: AC
  Filled 2024-07-19: qty 2

## 2024-07-19 MED ORDER — VERAPAMIL HCL 2.5 MG/ML IV SOLN
INTRAVENOUS | Status: DC | PRN
  Administered 2024-07-19: 10 mL via INTRA_ARTERIAL

## 2024-07-19 SURGICAL SUPPLY — 6 items
CATH INFINITI 5FR MULTPACK ANG (CATHETERS) IMPLANT
DEVICE RAD COMP TR BAND LRG (VASCULAR PRODUCTS) IMPLANT
GLIDESHEATH SLEND SS 6F .021 (SHEATH) IMPLANT
GUIDEWIRE INQWIRE 1.5J.035X260 (WIRE) IMPLANT
PACK CARDIAC CATHETERIZATION (CUSTOM PROCEDURE TRAY) ×1 IMPLANT
SET ATX-X65L (MISCELLANEOUS) IMPLANT

## 2024-07-19 NOTE — Progress Notes (Signed)
 Discharge instructions reviewed with patient and wife Rickey at the bedside. Denies questions or concerns. PT tolerate PO intake without complaints of N/V. Ambulated to the bathroom was able to void without difficulty. TR Band removed no s/s of complications at the incision site. PT was escorted from the unit via wheel chair to personal vehicle.

## 2024-07-19 NOTE — Discharge Instructions (Signed)

## 2024-07-19 NOTE — Interval H&P Note (Signed)
 History and Physical Interval Note:  07/19/2024 8:24 AM  Cameron Daniel  has presented today for surgery, with the diagnosis of aortic stenosis - cad.  The various methods of treatment have been discussed with the patient and family. After consideration of risks, benefits and other options for treatment, the patient has consented to  Procedure(s): LEFT HEART CATH AND CORS/GRAFTS ANGIOGRAPHY (N/A) as a surgical intervention.  The patient's history has been reviewed, patient examined, no change in status, stable for surgery.  I have reviewed the patient's chart and labs.  Questions were answered to the patient's satisfaction.    Cath Lab Visit (complete for each Cath Lab visit)  Clinical Evaluation Leading to the Procedure:   ACS: No.  Non-ACS:    Anginal Classification: CCS II  Anti-ischemic medical therapy: Minimal Therapy (1 class of medications)  Non-Invasive Test Results: No non-invasive testing performed  Prior CABG: Previous CABG        Cameron Daniel

## 2024-07-20 ENCOUNTER — Encounter (HOSPITAL_COMMUNITY): Payer: Self-pay | Admitting: Cardiovascular Disease

## 2024-07-24 ENCOUNTER — Ambulatory Visit (HOSPITAL_COMMUNITY)
Admission: RE | Admit: 2024-07-24 | Discharge: 2024-07-24 | Disposition: A | Discharge status: Home / Self Care | Source: Ambulatory Visit | Attending: Cardiology | Admitting: Cardiology

## 2024-07-24 DIAGNOSIS — I350 Nonrheumatic aortic (valve) stenosis: Secondary | ICD-10-CM | POA: Insufficient documentation

## 2024-07-24 MED ORDER — IOHEXOL 350 MG/ML SOLN
100.0000 mL | Freq: Once | INTRAVENOUS | Status: AC | PRN
  Administered 2024-07-24: 100 mL via INTRAVENOUS

## 2024-07-29 ENCOUNTER — Ambulatory Visit: Payer: Self-pay | Admitting: Cardiovascular Disease

## 2024-07-30 ENCOUNTER — Other Ambulatory Visit: Payer: Self-pay

## 2024-07-30 DIAGNOSIS — I350 Nonrheumatic aortic (valve) stenosis: Secondary | ICD-10-CM

## 2024-07-31 ENCOUNTER — Ambulatory Visit: Attending: Surgery | Admitting: Surgery

## 2024-07-31 ENCOUNTER — Encounter: Payer: Self-pay | Admitting: Surgery

## 2024-07-31 VITALS — BP 106/64 | HR 69 | Resp 18 | Ht 70.0 in | Wt 179.0 lb

## 2024-07-31 DIAGNOSIS — I350 Nonrheumatic aortic (valve) stenosis: Secondary | ICD-10-CM

## 2024-07-31 NOTE — Progress Notes (Unsigned)
 Patient ID: Cameron Daniel, male   DOB: [birth year], 78 y.o.   MRN: [id number]  [facility]   [facility]  [address] [place] [address]             [phone]         CARDIOTHORACIC SURGERY CONSULTATION REPORT  PCP is Duanne Butler DASEN, MD Referring Provider is [name], MD Primary Cardiologist is Darryle DASEN Decent, MD  Reason for consultation:  Severe aortic stenosis  HPI:  The patient is a history of hypertension, hyperlipidemia, type 2 diabetes, coronary artery disease status post CABG x 3 by me in 2020, ischemic cardiomyopathy with an ejection fraction of 25% status, ventricular tachycardia status post ablation and ICD placement, who was undergoing workup for a parathyroid  adenoma requiring parathyroidectomy by Dr. Eletha.  A heart murmur was heard on preop evaluation by anesthesia and a cardiology consultation was requested.  2D echocardiogram on 06/06/2024 showed a calcified aortic valve with restricted leaflet mobility.  The mean gradient was only 13 mmHg with a peak of 23 mmHg but a valve area by VTI of 0.84 cm.  Dimensionless index was 0.24 with a low stroke-volume index of 21 and a left ventricular ejection fraction of 25 to 30% with global hypokinesis.  Previous echocardiogram on 06/19/2023 showed an ejection fraction of 35 to 40% with a mean gradient of 9 mmHg across the aortic valve.  Echocardiogram in 07/2022 showed an ejection fraction of 40 to 45% with a severely calcified aortic valve without aortic stenosis.  He is here today with his wife.  He reports marked exertional fatigue and progressive exertional shortness of breath with low-level activity.  He feels like sleeping all the time.  He has had some dizziness with standing up but no syncope.  He denies lower extremity edema and orthopnea.  He denies any chest pain or pressure.  Past Medical History:  Diagnosis Date   AICD (automatic  cardioverter/defibrillator) present    Allergic rhinitis, cause unspecified    Allergy     Anxiety    Arthritis    Cancer (HCC)    a couple places removed from eyelid   Carotid Doppler 04/2020   Carotid US  9/21: No evidence of ICA stenosis bilaterally; right vertebral artery stenosis   Cataract    Chronic combined systolic (congestive) and diastolic (congestive) heart failure (HCC)    Coronary atherosclerosis of unspecified type of vessel, native or graft    a. MI 1985 with unclear details. b. CABG 03/2019 with LIMA -> LAD, SVG -> OM, SVG to dRCA.   Depression    PTSD    Diverticulosis of colon (without mention of hemorrhage)    Esophageal reflux    Esophageal stricture    Essential hypertension    Former tobacco use    Hemorrhoids    Hyperlipidemia    ICD (implantable cardioverter-defibrillator) in place    Irritable bowel syndrome    Ischemic cardiomyopathy    Mild carotid artery disease    a. 1-39% bilaterally 03/2019 duplex.   Myocardial infarction (HCC)    Nocturia    Obesity, unspecified    Orthostasis    Other acquired absence of organ    Personal history of colonic polyps    Psychosexual dysfunction with inhibited sexual excitement    PTSD (post-traumatic stress disorder)    Retroperitoneal bleed    Type II or unspecified type diabetes mellitus without mention of complication,  not stated as uncontrolled    VT (ventricular tachycardia) (HCC)     Past Surgical History:  Procedure Laterality Date   ADENOIDECTOMY     CARPAL TUNNEL RELEASE     left    CHOLECYSTECTOMY     COLON RESECTION     18 inches   COLON SURGERY  2013   CORONARY ANGIOPLASTY     1985    CORONARY ARTERY BYPASS GRAFT N/A 04/26/2019   Procedure: CORONARY ARTERY BYPASS GRAFTING (CABG) x Three, using left internal mammary artery and right leg greater saphenous vein harvested endoscopically;  Surgeon: Lucas Dorise POUR, MD;  Location: [facility] OR;  Service: Open Heart Surgery;  Laterality: N/A;    CORONARY/GRAFT ANGIOGRAPHY N/A 07/19/2024   Procedure: CORONARY/GRAFT ANGIOGRAPHY;  Surgeon: Verlin Lonni BIRCH, MD;  Location: [facility] INVASIVE CV LAB;  Service: Cardiovascular;  Laterality: N/A;   ELBOW BURSA SURGERY     EYE SURGERY     ICD GENERATOR CHANGEOUT N/A 12/28/2023   Procedure: ICD GENERATOR CHANGEOUT;  Surgeon: Cindie Ole DASEN, MD;  Location: [facility] INVASIVE CV LAB;  Service: Cardiovascular;  Laterality: N/A;   ICD IMPLANT N/A 06/25/2020   Procedure: ICD IMPLANT;  Surgeon: Cindie Ole DASEN, MD;  Location: [facility] INVASIVE CV LAB;  Service: Cardiovascular;  Laterality: N/A;   LEAD INSERTION N/A 12/28/2023   Procedure: LEAD INSERTION;  Surgeon: Cindie Ole DASEN, MD;  Location: [facility] INVASIVE CV LAB;  Service: Cardiovascular;  Laterality: N/A;   LEFT HEART CATH AND CORS/GRAFTS ANGIOGRAPHY N/A 06/19/2020   Procedure: LEFT HEART CATH AND CORS/GRAFTS ANGIOGRAPHY;  Surgeon: Burnard Debby LABOR, MD;  Location: [facility] IN[facility] CV LAB;  Service: Cardiovascular;  Laterality: N/A;   LEFT HEART CATH AND CORS/GRAFTS ANGIOGRAPHY N/A 03/08/2023   Procedure: LEFT HEART CATH AND CORS/GRAFTS ANGIOGRAPHY;  Surgeon: Darron Deatrice LABOR, MD;  Location: [facility];  Service: Cardiovascular;  Laterality: N/A;   POLYPECTOMY     RIGHT/LEFT HEART CATH AND CORONARY ANGIOGRAPHY N/A 04/16/2019   Procedure: RIGHT/LEFT HEART CATH AND CORONARY ANGIOGRAPHY;  Surgeon: Claudene Victory ORN, MD;  Location: [facility] CV LAB;  Service: Cardiovascular;  Laterality: N/A;   SHOULDER SURGERY     Rt. Shoulder   TEE WITHOUT CARDIOVERSION N/A 04/26/2019   Procedure: TRANSESOPHAGEAL ECHOCARDIOGRAM (TEE);  Surgeon: Lucas Dorise POUR, MD;  Location: [facility] OR;  Service: Open Heart Surgery;  Laterality: N/A;   TONSILLECTOMY     V TACH ABLATION N/A 06/22/2020   Procedure: V TACH ABLATION;  Surgeon: Cindie Ole DASEN, MD;  Location: [facility];  Service: Cardiovascular;  Laterality: N/A;    Family History  Problem Relation Age of Onset    Lung cancer Father    Allergic rhinitis Father    Coronary artery disease Mother    Colon cancer Neg Hx    Heart attack Neg Hx    Hypertension Neg Hx    Stroke Neg Hx    Esophageal cancer Neg Hx    Rectal cancer Neg Hx    Stomach cancer Neg Hx    Pancreatic cancer Neg Hx     Social History   Socioeconomic History   Marital status: Married    Spouse name: Not on file   Number of children: 1   Years of education: 12   Highest education level: High school graduate  Occupational History   Occupation: Therapist, Sports- retired  Tobacco Use   Smoking status: Former    Current packs/day: 0.00    Types: Cigarettes  Quit date: 08/30/1983    Years since quitting: 40.9   Smokeless tobacco: Former   Tobacco comments:    Quit 30 years ago.  Vaping Use   Vaping status: Never Used  Substance and Sexual Activity   Alcohol use: Not Currently    Comment: rare   Drug use: No   Sexual activity: Yes  Other Topics Concern   Not on file  Social History Narrative   Not on file   Social Drivers of Health   Financial Resource Strain: Low Risk  (12/22/2022)   Overall Financial Resource Strain (CARDIA)    Difficulty of Paying Living Expenses: Not hard at all  Food Insecurity: No Food Insecurity (12/29/2023)   Hunger Vital Sign    Worried About Running Out of Food in the Last Year: Never true    Ran Out of Food in the Last Year: Never true  Transportation Needs: No Transportation Needs (12/29/2023)   PRAPARE - Administrator, Civil Service (Medical): No    Lack of Transportation (Non-Medical): No  Physical Activity: Insufficiently Active (12/22/2022)   Exercise Vital Sign    Days of Exercise per Week: 3 days    Minutes of Exercise per Session: 30 min  Stress: No Stress Concern Present (12/22/2022)   [employer] of Occupational Health - Occupational Stress Questionnaire    Feeling of Stress : Not at all  Social Connections: Moderately Isolated (12/29/2023)   Social Connection and  Isolation Panel    Frequency of Communication with Friends and Family: Once a week    Frequency of Social Gatherings with Friends and Family: Once a week    Attends Religious Services: Never    Database Administrator or Organizations: No    Attends Engineer, Structural: 1 to 4 times per year    Marital Status: Married  Catering Manager Violence: Not At Risk (12/29/2023)   Humiliation, Afraid, Rape, and Kick questionnaire    Fear of Current or Ex-Partner: No    Emotionally Abused: No    Physically Abused: No    Sexually Abused: No    Prior to Admission medications   Medication Sig Start Date End Date Taking? Authorizing Provider  acetaminophen  (TYLENOL ) 500 MG tablet Take 1,000 mg by mouth every 6 (six) hours as needed for mild pain (pain score 1-3).   Yes [provider]  aspirin  EC 81 MG tablet Take 1 tablet (81 mg total) by mouth daily. 05/16/19  Yes Dunn, Dayna N, PA-C  Azelastine  HCl 137 MCG/SPRAY SOLN PLACE 1 SPRAY INTO BOTH NOSTRILS 2 (TWO) TIMES DAILY DIRECTED 06/13/24  Yes Duanne Butler DASEN, MD  Cholecalciferol (VITAMIN D -3 PO) Take 1 tablet by mouth in the morning.   Yes [provider]  Cholecalciferol (VITAMIN D3 PO) Take by mouth.   Yes [provider]  cholestyramine  (QUESTRAN ) 4 g packet Take 0.5 packets (2 g total) by mouth 2 (two) times daily. Patient taking differently: Take 2 g by mouth 2 (two) times daily as needed. 08/15/23  Yes Duanne Butler DASEN, MD  citalopram  (CELEXA ) 10 MG tablet Take 1 tablet (10 mg total) by mouth daily. 06/13/24  Yes Duanne Butler DASEN, MD  clotrimazole  (LOTRIMIN ) 1 % cream Apply 1 Application topically 2 (two) times daily. 02/06/24  Yes Duanne Butler DASEN, MD  empagliflozin  (JARDIANCE ) 25 MG TABS tablet TAKE 25 MG (1 TABLET) BY MOUTH DAILY BEFORE BREAKFAST. STOP PIOGLITAZONE  03/24/23  Yes Conte, Tessa N, PA-C  finasteride  (PROSCAR ) 5 MG  tablet TAKE 1 TABLET BY MOUTH EVERY DAY 03/17/23  Yes Duanne Butler DASEN, MD   hydrocortisone  2.5 % cream Apply topically 2 (two) times daily. Patient taking differently: Apply 1 Application topically 2 (two) times daily as needed. 05/30/24  Yes Duanne Butler DASEN, MD  metoprolol  succinate (TOPROL  XL) 25 MG 24 hr tablet Take 0.5 tablets (12.5 mg total) by mouth daily. 05/28/24 05/28/25 Yes PickardButler DASEN, MD  montelukast  (SINGULAIR ) 10 MG tablet Take 1 tablet (10 mg total) by mouth at bedtime. 04/23/24  Yes Duanne Butler DASEN, MD  nitroGLYCERIN  (NITROSTAT ) 0.4 MG SL tablet Place 1 tablet (0.4 mg total) under the tongue every 5 (five) minutes x 3 doses as needed for chest pain. 12/23/23  Yes Weaver, Scott T, PA-C  olopatadine  (PATADAY ) 0.1 % ophthalmic solution Place 1 drop into both eyes 2 (two) times daily as needed for allergies.   Yes [provider]  pantoprazole  (PROTONIX ) 40 MG tablet TAKE 1 TABLET BY MOUTH TWICE A DAY 10/09/23  Yes Duanne Butler DASEN, MD  rosuvastatin  (CRESTOR ) 40 MG tablet Take 1 tablet (40 mg total) by mouth daily. 01/23/24  Yes Duanne Butler DASEN, MD  sitaGLIPtin  (JANUVIA ) 100 MG tablet Take 100 mg by mouth in the morning.   Yes [provider]  spironolactone  (ALDACTONE ) 25 MG tablet TAKE 1/2 TABLET BY MOUTH EVERY DAY 01/08/24  Yes O'Neal, Darryle Ned, MD  docusate sodium  (COLACE) 100 MG capsule Take 100 mg by mouth daily as needed for mild constipation. Patient not taking: Reported on 07/31/2024    [provider]  fexofenadine  (ALLEGRA ) 180 MG tablet Take 180 mg by mouth in the morning. Patient not taking: Reported on 07/31/2024    [provider]  MAGNESIUM  PO Take 1 tablet by mouth in the morning. Patient not taking: Reported on 07/31/2024    [provider]    Current Outpatient Medications  Medication Sig Dispense Refill   acetaminophen  (TYLENOL ) 500 MG tablet Take 1,000 mg by mouth every 6 (six) hours as needed for mild pain (pain score 1-3).     aspirin  EC 81 MG tablet Take 1 tablet (81 mg total) by  mouth daily. 90 tablet 3   Azelastine  HCl 137 MCG/SPRAY SOLN PLACE 1 SPRAY INTO BOTH NOSTRILS 2 (TWO) TIMES DAILY DIRECTED 30 mL 1   Cholecalciferol (VITAMIN D -3 PO) Take 1 tablet by mouth in the morning.     Cholecalciferol (VITAMIN D3 PO) Take by mouth.     cholestyramine  (QUESTRAN ) 4 g packet Take 0.5 packets (2 g total) by mouth 2 (two) times daily. (Patient taking differently: Take 2 g by mouth 2 (two) times daily as needed.) 60 each 3   citalopram  (CELEXA ) 10 MG tablet Take 1 tablet (10 mg total) by mouth daily. 90 tablet 3   clotrimazole  (LOTRIMIN ) 1 % cream Apply 1 Application topically 2 (two) times daily. 30 g 2   empagliflozin  (JARDIANCE ) 25 MG TABS tablet TAKE 25 MG (1 TABLET) BY MOUTH DAILY BEFORE BREAKFAST. STOP PIOGLITAZONE  90 tablet 0   finasteride  (PROSCAR ) 5 MG tablet TAKE 1 TABLET BY MOUTH EVERY DAY 90 tablet 3   hydrocortisone  2.5 % cream Apply topically 2 (two) times daily. (Patient taking differently: Apply 1 Application topically 2 (two) times daily as needed.) 30 g 0   metoprolol  succinate (TOPROL  XL) 25 MG 24 hr tablet Take 0.5 tablets (12.5 mg total) by mouth daily. 90 tablet 3   montelukast  (SINGULAIR ) 10 MG tablet Take 1 tablet (10  mg total) by mouth at bedtime. 90 tablet 0   nitroGLYCERIN  (NITROSTAT ) 0.4 MG SL tablet Place 1 tablet (0.4 mg total) under the tongue every 5 (five) minutes x 3 doses as needed for chest pain. 25 tablet 12   olopatadine  (PATADAY ) 0.1 % ophthalmic solution Place 1 drop into both eyes 2 (two) times daily as needed for allergies.     pantoprazole  (PROTONIX ) 40 MG tablet TAKE 1 TABLET BY MOUTH TWICE A DAY 180 tablet 3   rosuvastatin  (CRESTOR ) 40 MG tablet Take 1 tablet (40 mg total) by mouth daily. 90 tablet 3   sitaGLIPtin  (JANUVIA ) 100 MG tablet Take 100 mg by mouth in the morning.     spironolactone  (ALDACTONE ) 25 MG tablet TAKE 1/2 TABLET BY MOUTH EVERY DAY 45 tablet 3   docusate sodium  (COLACE) 100 MG capsule Take 100 mg by mouth daily as  needed for mild constipation. (Patient not taking: Reported on 07/31/2024)     fexofenadine  (ALLEGRA ) 180 MG tablet Take 180 mg by mouth in the morning. (Patient not taking: Reported on 07/31/2024)     MAGNESIUM  PO Take 1 tablet by mouth in the morning. (Patient not taking: Reported on 07/31/2024)     No current facility-administered medications for this visit.    Allergies  Allergen Reactions   Doxazosin Mesylate Other (See Comments)    Drops in blood pressure    Losartan  Other (See Comments)    muscle pain   Testosterone  Other (See Comments)     Erythrocytosis    Flomax  [Tamsulosin  Hcl] Itching and Other (See Comments)    Drops in blood pressure     Penicillins Rash and Other (See Comments)    Has patient had a PCN reaction causing immediate rash, facial/tongue/throat swelling, SOB or lightheadedness with hypotension: No Has patient had a PCN reaction causing severe rash involving mucus membranes or skin necrosis: No Has patient had a PCN reaction that required hospitalization No Has patient had a PCN reaction occurring within the last 10 years: No If all of the above answers are NO, then may proceed with Cephalosporin use.    Sulfonamide Derivatives Rash   Trulicity  [Dulaglutide ] Nausea And Vomiting    N&V, Dizziness      Review of Systems:   General:  + decreased appetite, + decreased energy, no weight gain, + 70+ lb weight loss, no fever  Cardiac:  no chest pain with exertion, no chest pain at rest, +SOB with mild exertion, no resting SOB, no PND, no orthopnea, no palpitations, no arrhythmia, no atrial fibrillation, no LE edema, + dizzy spells, no syncope  Respiratory:  + exertional shortness of breath, no home oxygen, no productive cough, no dry cough, no bronchitis, no wheezing, no hemoptysis, no asthma, no pain with inspiration or cough, no sleep apnea, no CPAP at night  GI:   no difficulty swallowing, no reflux, no frequent heartburn, no hiatal hernia, no abdominal pain,  no constipation, no diarrhea, no hematochezia, no hematemesis, no melena  GU:   no dysuria,  no frequency, no urinary tract infection, no hematuria, no enlarged prostate, no kidney stones, no kidney disease  Vascular:  no pain suggestive of claudication, no pain in feet, no leg cramps, no varicose veins, no DVT, no non-healing foot ulcer  Neuro:   no stroke, no TIA's, no seizures, no headaches, no temporary blindness one eye,  no slurred speech, + peripheral neuropathy, no chronic pain, no instability of gait, no memory/cognitive dysfunction  Musculoskeletal: no arthritis, no  joint swelling, no myalgias, no difficulty walking, normal mobility   Skin:   no rash, no itching, no skin infections, no pressure sores or ulcerations  Psych:   no anxiety, no depression, no nervousness, no unusual recent stress  Eyes:   no blurry vision, no floaters, no recent vision changes, no glasses or contacts  ENT:   no hearing loss, no loose or painful teeth, + dentures  Hematologic:  + easy bruising, no abnormal bleeding, no clotting disorder, no frequent epistaxis  Endocrine:  + diabetes, does  check CBG's at home     Physical Exam:   BP 106/64 (BP Location: Left Arm)   Pulse 69   Resp 18   Ht 5' 10 (1.778 m)   Wt 179 lb (81.2 kg)   SpO2 97%   BMI 25.68 kg/m   General:  well-appearing  HEENT:  Unremarkable, NCAT, PERLA, EOMI  Neck:   no JVD, no bruits, no adenopathy   Chest:   clear to auscultation, symmetrical breath sounds, no wheezes, no rhonchi   CV:   RRR, 3/6 systolic murmur RSB, no diastolic murmur  Abdomen:  soft, non-tender, no masses   Extremities:  warm, well-perfused, pedal pulses palpable, no lower extremity edema  Rectal/GU  Deferred  Neuro:   Grossly non-focal and symmetrical throughout  Skin:   Clean and dry, no rashes, no breakdown  Diagnostic Tests:  ECHOCARDIOGRAM REPORT       Patient Name:   Cameron Daniel Date of Exam: 06/04/2024  Medical Rec #:  [id number]           Height:       70.0 in  Accession #:    [id number]         Weight:       184.2 lb  Date of Birth:  May 25, 1946          BSA:          2.016 m  Patient Age:    78 years           BP:           112/72 mmHg  Patient Gender: M                  HR:           71 bpm.  Exam Location:  [address]   Procedure: 2D Echo, Color Doppler and Limited Color Doppler (Both Spectral  and            Color Flow Doppler were utilized during procedure).   Indications:    I35.0 Aortic Stenosis    History:        Patient has prior history of Echocardiogram examinations,  most                 recent 06/19/2023. Prior CABG; Risk Factors:Diabetes,                  Hypertension and HLD.    Sonographer:    Waldo Guadalajara RCS  Referring Phys: [id number] [place] L [place]   IMPRESSIONS     1. Left ventricular ejection fraction, by estimation, is 25 to 30%. The  left ventricle has severely decreased function. The left ventricle  demonstrates global hypokinesis. There is severe concentric left  ventricular hypertrophy. Left ventricular  diastolic parameters are indeterminate.   2. Right ventricular systolic function is low normal. The right  ventricular size is normal. There is normal pulmonary artery systolic  pressure.  3. Left atrial size was mildly dilated.   4. Right atrial size was mildly dilated.   5. 2D MVA 4.06 cm2; calcified chords. The mitral valve is degenerative.  Mild mitral valve regurgitation. Severe mitral annular calcification.   6. Low stroke volume index with DVI 0.24; consistent with low flow low  gradient aortic stenosis. The aortic valve is calcified. Aortic valve  regurgitation is not visualized. Severe aortic valve stenosis. Aortic  valve area, by VTI measures 0.84 cm.   Comparison(s): Prior images reviewed side by side. Function and DVI are  worse from prior.   FINDINGS   Left Ventricle: Left ventricular ejection fraction, by estimation, is 25  to 30%. The left ventricle has  severely decreased function. The left  ventricle demonstrates global hypokinesis. The left ventricular internal  cavity size was normal in size. There  is severe concentric left ventricular hypertrophy. Left ventricular  diastolic parameters are indeterminate.   Right Ventricle: The right ventricular size is normal. No increase in  right ventricular wall thickness. Right ventricular systolic function is  low normal. There is normal pulmonary artery systolic pressure. The  tricuspid regurgitant velocity is 2.17 m/s,   and with an assumed right atrial pressure of 3 mmHg, the estimated right  ventricular systolic pressure is 21.8 mmHg.   Left Atrium: Left atrial size was mildly dilated.   Right Atrium: Right atrial size was mildly dilated.   Pericardium: There is no evidence of pericardial effusion.   Mitral Valve: 2D MVA 4.06 cm2; calcified chords. The mitral valve is  degenerative in appearance. Severe mitral annular calcification. Mild  mitral valve regurgitation.   Tricuspid Valve: The tricuspid valve is normal in structure. Tricuspid  valve regurgitation is mild . No evidence of tricuspid stenosis.   Aortic Valve: Low stroke volume index with DVI 0.24; consistent with low  flow low gradient aortic stenosis. The aortic valve is calcified. Aortic  valve regurgitation is not visualized. Severe aortic stenosis is present.  Aortic valve mean gradient  measures 13.0 mmHg. Aortic valve peak gradient measures 22.7 mmHg. Aortic  valve area, by VTI measures 0.84 cm.   Pulmonic Valve: The pulmonic valve was normal in structure. Pulmonic valve  regurgitation is trivial.   Aorta: The aortic root and ascending aorta are structurally normal, with  no evidence of dilitation.   IAS/Shunts: The atrial septum is grossly normal.     LEFT VENTRICLE  PLAX 2D  LVIDd:         4.60 cm   Diastology  LVIDs:         3.70 cm   LV e' medial:    4.90 cm/s  LV PW:         1.50 cm   LV E/e' medial:   23.6  LV IVS:        1.60 cm   LV e' lateral:   3.70 cm/s  LVOT diam:     2.10 cm   LV E/e' lateral: 31.2  LV SV:         42  LV SV Index:   21  LVOT Area:     3.46 cm  LV IVRT:       63 msec     RIGHT VENTRICLE  RV Basal diam:  3.70 cm    PULMONARY VEINS  RV S prime:     6.53 cm/s  Systolic Velocity: 80.30 cm/s  TAPSE (M-mode): 1.6 cm  RVSP:  21.8 mmHg   LEFT ATRIUM             Index        RIGHT ATRIUM           Index  LA diam:        4.30 cm 2.13 cm/m   RA Pressure: 3.00 mmHg  LA Vol (A2C):   71.2 ml 35.32 ml/m  RA Area:     21.90 cm  LA Vol (A4C):   64.8 ml 32.15 ml/m  RA Volume:   62.90 ml  31.21 ml/m  LA Biplane Vol: 68.8 ml 34.13 ml/m   AORTIC VALVE  AV Area (Vmax):    0.88 cm  AV Area (Vmean):   0.90 cm  AV Area (VTI):     0.84 cm  AV Vmax:           238.00 cm/s  AV Vmean:          169.000 cm/s  AV VTI:            0.492 m  AV Peak Grad:      22.7 mmHg  AV Mean Grad:      13.0 mmHg  LVOT Vmax:         60.70 cm/s  LVOT Vmean:        43.800 cm/s  LVOT VTI:          0.120 m  LVOT/AV VTI ratio: 0.24    AORTA  Ao Root diam: 3.20 cm  Ao Asc diam:  2.60 cm   MITRAL VALVE                TRICUSPID VALVE                              TR Peak grad:   18.8 mmHg  V Decel Time:               TR Vmax:        217.00 cm/s  MR Peak grad: 36.7 mmHg     Estimated RAP:  3.00 mmHg  MR Mean grad: 24.0 mmHg     RVSP:           21.8 mmHg  MR Vmax:      303.00 cm/s  MR Vmean:     226.0 cm/s    SHUNTS  MV E velocity: 115.50 cm/s  Systemic VTI:  0.12 m                              Systemic Diam: 2.10 cm   Stanly Leavens MD  Electronically signed by Stanly Leavens MD  Signature Date/Time: 06/04/2024/2:24:33 PM        Final      rocedures  CORONARY/GRAFT ANGIOGRAPHY   Conclusion      Mid LM to Dist LM lesion is 50% stenosed.   Ost LAD lesion is 80% stenosed.   Prox LAD to Mid LAD lesion is 90% stenosed.   Mid LAD lesion is 95% stenosed.   Mid  Cx to Dist Cx lesion is 95% stenosed.   Prox Cx to Mid Cx lesion is 100% stenosed.   Mid RCA lesion is 85% stenosed.   Prox Graft lesion is 20% stenosed.   SVG graft was visualized by angiography.   SVG graft was visualized by angiography.   LIMA graft was visualized by angiography.  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 Patent LIMA to LAD Chronic occlusion mid Circumflex. Patent vein graft to the obtuse marginal.  Severe mid RCA stenosis. Patent vein graft to the distal PDA   Continue workup for TAVR     Narrative & Impression  CLINICAL DATA:  Severe Aortic Stenosis.   EXAM: Cardiac TAVR CT   TECHNIQUE: A non-contrast, gated CT scan was obtained with axial slices of 2.5 mm through the heart for aortic valve scoring. A 120 kV retrospective, gated, contrast cardiac scan was obtained. Gantry rotation speed was 230 msec and collimation was 0.63 mm. Nitroglycerin  was not given. A delayed scan was obtained to exclude left atrial appendage thrombus. The 3D dataset was reconstructed in systole with motion correction. The 3D data set was reconstructed in 5% intervals of the 0-95% of the R-R cycle. Systolic and diastolic phases were analyzed on a dedicated workstation using MPR, MIP, and VRT modes. The patient received 100 cc of contrast.   FINDINGS: Aortic Valve: Valve Morphology: Tricuspid valve with minimal calcification but with reduced excursion the planimeter valve area is 1.26 cm2 sq cm consistent with moderate to severe aortic stenosis   Annular and subannular calcification: Mild   Aortic Valve Calcium  score: 1065   Perimembranous septal diameter: 5.3 mm   Mitral Valve: Severe mitral annular calcification. Mitral valve calcification and thickening calcification of the papillary muscles and chords.   Aortic Annulus Measurements-25% phase   Major annulus diameter: 30 mm   Minor annulus diameter:22 mm   Annular perimeter:  81 mm   Annular area: 4.90 cm2   Aortic Measurements- 75% phase   Sinotubular Junction: 29 mm   Ascending Thoracic Aorta: 32 mm   Descending Thoracic Aorta: 20 mm   Aortic atherosclerosis.   Sinus of Valsalva Measurements:   Right coronary cusp width: 32 mm   Left coronary cusp width: 33 mm   Non coronary cusp width: 33 mm   Coronary Artery Height above Annulus:   Left Main: 18 mm   Left SoV height: 24 mm   Right Coronary: 22 mm   Right SoV height: 24 mm   Optimum Fluoroscopic Angle for Delivery: LAO 2, CAU 2   Cusp overlay view angle: RAO 0, CAU 4   Valves for structural team consideration if TAVR is pursued:   26 mm Sapien   29 mm Evolut   Non TAVR Valve Findings:   Coronary Arteries: Normal coronary origin.  Prior CABG noted.   Systemic veins: Normal anatomy.   Main Pulmonary artery: Normal caliber.   Pulmonary veins: Normal anatomy.   Left atrial appendage: Distal tip filling defect. Delayed filling is favored over thrombus when comparing to calcium  score sequence. No delayed sequence for confirmation.   Interatrial septum: No clear communication.   Chamber dimensions: Right ventricular dilation. 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 There is no thrombus. Bi-atrial enlargement.   Pericardium: Trivial apical pericardial effusion.   CIED: Three leads are incompletely imaged in this study. A right atrial lead is seen from the SVC to the termination in the right atrial appendage. A right ventricular lead is seen form the SVC to the termination in the right ventricular apex. A coronary sinus lead is seen from the SVC through the coronary sinus and appears to terminate in the great cardiac vein. No clear fracture or perforation noted.   Extra Cardiac Findings as per separate reporting.   Image  quality: Good   Noise artifact is: Limited to device lead artifact.   IMPRESSION: 1. Aortic stenosis. Measurements for potential  interventions as above.   Stanly Leavens MD   Electronically Signed: By: Stanly Leavens M.D. On: 07/24/2024 16:52      Narrative & Impression  CLINICAL DATA:  Aortic valve replacement, preoperative evaluation   EXAM: CTA ABDOMEN AND PELVIS WITHOUT AND WITH CONTRAST   TECHNIQUE: Multidetector CT imaging of the abdomen and pelvis was performed using the standard protocol during bolus administration of intravenous contrast. Multiplanar reconstructed images and MIPs were obtained and reviewed to evaluate the vascular anatomy.   RADIATION DOSE REDUCTION: This exam was performed according to the departmental dose-optimization program which includes automated exposure control, adjustment of the mA and/or kV according to patient size and/or use of iterative reconstruction technique.   CONTRAST:  OMNIPAQUE  IOHEXOL  350 MG/ML SOLN   COMPARISON:  CT of the abdomen and pelvis performed Jan 03, 2023   FINDINGS: VASCULAR   Aorta: Patent with mild diffuse atherosclerotic changes. Minimal diameter is estimated at 11 mm.   Celiac: Patent.   SMA: Patent.   Renals: Patent.  Multiple renal arteries bilaterally.   IMA: Patent.   Inflow: The right common iliac artery is patent with minimal diameter estimated at 5.5 mm. Mild atherosclerotic changes. The right internal iliac artery is patent. The right external iliac artery is patent with mild atherosclerotic changes and minimal diameter estimated at 5 mm.   The left common iliac artery is patent with minimal diameter estimated at 6 mm. Left internal iliac artery is patent. The left external iliac artery is patent with minimal diameter estimated at 6 mm.   Proximal Outflow: The right common femoral artery is patent with minimal diameter estimated at 6 mm.   The left common femoral artery is patent with posterior and medial calcified plaque and minimal diameter estimated at 4.5 mm.   Veins: Incidental note is  made of early venous filling in the proximal right lower extremity and right iliac system (relative to the absence of enhancement of the venous structures on the left).   Review of the MIP images confirms the above findings.   NON-VASCULAR   Lower chest: Reported separately.   Hepatobiliary: Postsurgical changes from cholecystectomy. Mildly nodular liver surface contour.   Pancreas: Unremarkable. No pancreatic ductal dilatation or surrounding inflammatory changes.   Spleen: Normal in size without focal abnormality.   Adrenals/Urinary Tract: Adrenal glands are unremarkable. Kidneys are normal, without renal calculi, focal lesion, or hydronephrosis. Bladder is decompressed.   Stomach/Bowel: Scattered colonic diverticulosis.   Lymphatic: No evidence of significant lymphadenopathy.   Reproductive: Unchanged.   Other: Previously observed heterogeneous fat containing density in the right retroperitoneum is similar in size measuring 8.6 x 5.2 cm. Image 167 of CT series 309.   Musculoskeletal: Degenerative changes in the lumbar spine.   IMPRESSION: 1. Minimal vessel diameters are detailed above. 2. Early venous enhancement in the right pelvis which suggests the presence of a right lower extremity AV fistula. Consideration can be given toward further evaluation by arterial duplex ultrasound for further characterization.     Electronically Signed   By: Maude Naegeli M.D.   On: 07/26/2024 11:51        Impression:  This 78 year old gentleman has symptomatic, stage D2 low-flow/low gradient severe aortic stenosis with NYHA class III symptoms of exertional fatigue and shortness of breath consistent with chronic systolic and diastolic congestive heart failure.  I personally  reviewed his 2D echocardiogram, cardiac catheterization, and CTA studies.  His echocardiogram shows a severely calcified and thickened aortic valve with markedly restricted leaflet mobility.  The mean gradient  was only 13 mmHg but the valve area by VTI was 0.84 with a dimensionless index of 0.24 and a very low stroke-volume index of 21 with an ejection fraction of 25%. His LVEF has gone down over the past 2 years from 45% to 25%.  Cardiac catheterization shows patent bypass grafts.  I agree that aortic valve replacement is the best treatment for this patient for improvement of his symptoms and congestive heart failure and to prevent further deterioration of cardiac function.  Given his age and prior coronary bypass surgery I think transcatheter aortic valve replacement is the best option for treating him.  His gated cardiac CTA shows anatomy suitable for TAVR using either an Edwards SAPIEN or Medtronic Evolut valve.  His abdominal and pelvic CTA shows adequate pelvic vascular anatomy to allow transfemoral insertion.  There is early venous filling in the proximal right lower extremity and right iliac system compared to the left side suggesting an AV fistula.  He had a cardiac catheterization in 2021 through the right groin that was complicated by a groin bleed and significant hematoma formation after the procedure which may have led to an AV fistula.  This has never been investigated.  The patient and his wife were counseled at length regarding treatment alternatives for management of severe symptomatic aortic stenosis. The risks and benefits of surgical intervention has been discussed in detail. Long-term prognosis with medical therapy was discussed. Alternative approaches such as conventional surgical aortic valve replacement, transcatheter aortic valve replacement, and palliative medical therapy were compared and contrasted at length. This discussion was placed in the context of the patient's own specific clinical presentation and past medical history. All of their questions have been addressed.   Following the decision to proceed with transcatheter aortic valve replacement, a discussion was held regarding what  types of management strategies would be attempted intraoperatively in the event of life-threatening complications, including whether or not the patient would be considered a candidate for the use of cardiopulmonary bypass and/or conversion to open sternotomy for attempted surgical intervention.  Given his age and prior coronary bypass surgery with an ejection fraction of 25% I do not think he is a candidate for emergent sternotomy to manage any intraoperative complications.  The patient has been advised of a variety of complications that might develop including but not limited to risks of death, stroke, paravalvular leak, aortic dissection or other major vascular complications, aortic annulus rupture, device embolization, cardiac rupture or perforation, mitral regurgitation, acute myocardial infarction, arrhythmia, heart block or bradycardia requiring permanent pacemaker placement, congestive heart failure, respiratory failure, renal failure, pneumonia, infection, other late complications related to structural valve deterioration or migration, or other complications that might ultimately cause a temporary or permanent loss of functional independence or other long term morbidity. The patient provides full informed consent for the procedure as described and all questions were answered.     Plan:  He will be scheduled for left transfemoral TAVR using a Medtronic valve on 08/06/2024.  We will plan to use the left radial artery for secondary access and direct LV pacing.  He will not need femoral venous access since he is not a bailout candidate.  Dorise LOIS Fellers, MD 07/31/2024

## 2024-08-02 ENCOUNTER — Encounter: Payer: Self-pay | Admitting: Cardiology

## 2024-08-02 NOTE — Progress Notes (Signed)
 PERIOPERATIVE PRESCRIPTION FOR IMPLANTED CARDIAC DEVICE PROGRAMMING  Patient Information: Name:  Cameron Daniel  DOB:  July 06, 1946  MRN:  [id number]    Planned Procedure:  Transcatheter Aortic Valve Replacement  Surgeon:  Dr. Lurena Red and Dr. Dorise Fellers  Date of Procedure:  08/06/2024  Cautery will be used.  Position during surgery:  Supine   Device Information:  Clinic EP Physician:  Ole Holts, MD  Device Type:  Defibrillator Manufacturer and Phone #:  Medtronic: [phone] Pacemaker Dependent?:  Yes.   Date of Last Device Check:  07/01/24     Normal Device Function?:  Yes.    Electrophysiologist's Recommendations:  Have magnet available. Provide continuous ECG monitoring when magnet is used or reprogramming is to be performed.  Procedure will likely interfere with device function.  Device should be programmed:  Tachy therapies disabled and Asynchronous pacing during procedure and returned to normal programming after procedure  Per Device Clinic Standing Orders, Powell Level, RN  11:58 AM 08/02/2024

## 2024-08-05 ENCOUNTER — Other Ambulatory Visit: Payer: Self-pay

## 2024-08-05 ENCOUNTER — Inpatient Hospital Stay (HOSPITAL_COMMUNITY): Admission: RE | Admit: 2024-08-05 | Discharge: 2024-08-05 | Attending: Internal Medicine

## 2024-08-05 ENCOUNTER — Ambulatory Visit (HOSPITAL_COMMUNITY)
Admission: RE | Admit: 2024-08-05 | Discharge: 2024-08-05 | Disposition: A | Discharge status: Home / Self Care | Source: Ambulatory Visit | Attending: Internal Medicine | Admitting: Internal Medicine

## 2024-08-05 DIAGNOSIS — J45909 Unspecified asthma, uncomplicated: Secondary | ICD-10-CM | POA: Diagnosis not present

## 2024-08-05 DIAGNOSIS — I255 Ischemic cardiomyopathy: Secondary | ICD-10-CM | POA: Diagnosis not present

## 2024-08-05 DIAGNOSIS — E1169 Type 2 diabetes mellitus with other specified complication: Secondary | ICD-10-CM | POA: Diagnosis not present

## 2024-08-05 DIAGNOSIS — I358 Other nonrheumatic aortic valve disorders: Secondary | ICD-10-CM | POA: Diagnosis not present

## 2024-08-05 DIAGNOSIS — Z01818 Encounter for other preprocedural examination: Secondary | ICD-10-CM

## 2024-08-05 DIAGNOSIS — I472 Ventricular tachycardia, unspecified: Secondary | ICD-10-CM | POA: Diagnosis not present

## 2024-08-05 DIAGNOSIS — I110 Hypertensive heart disease with heart failure: Secondary | ICD-10-CM | POA: Diagnosis not present

## 2024-08-05 DIAGNOSIS — Z7982 Long term (current) use of aspirin: Secondary | ICD-10-CM | POA: Diagnosis not present

## 2024-08-05 DIAGNOSIS — I350 Nonrheumatic aortic (valve) stenosis: Secondary | ICD-10-CM

## 2024-08-05 DIAGNOSIS — I5042 Chronic combined systolic (congestive) and diastolic (congestive) heart failure: Secondary | ICD-10-CM | POA: Diagnosis not present

## 2024-08-05 DIAGNOSIS — E669 Obesity, unspecified: Secondary | ICD-10-CM | POA: Diagnosis not present

## 2024-08-05 DIAGNOSIS — K219 Gastro-esophageal reflux disease without esophagitis: Secondary | ICD-10-CM | POA: Diagnosis not present

## 2024-08-05 LAB — CBC
HCT: 48.6 % (ref 39.0–52.0)
Hemoglobin: 15.5 g/dL (ref 13.0–17.0)
MCH: 27.2 pg (ref 26.0–34.0)
MCHC: 31.9 g/dL (ref 30.0–36.0)
MCV: 85.4 fL (ref 80.0–100.0)
Platelets: 125 K/uL — ABNORMAL LOW (ref 150–400)
RBC: 5.69 MIL/uL (ref 4.22–5.81)
RDW: 15.7 % — ABNORMAL HIGH (ref 11.5–15.5)
WBC: 7.8 K/uL (ref 4.0–10.5)
nRBC: 0 % (ref 0.0–0.2)

## 2024-08-05 LAB — SURGICAL PCR SCREEN
MRSA, PCR: NEGATIVE
Staphylococcus aureus: NEGATIVE

## 2024-08-05 LAB — URINALYSIS, ROUTINE W REFLEX MICROSCOPIC
Bacteria, UA: NONE SEEN
Bilirubin Urine: NEGATIVE
Glucose, UA: >=500 mg/dL — AB
Hgb urine dipstick: NEGATIVE
Ketones, ur: NEGATIVE mg/dL
Leukocytes,Ua: NEGATIVE
Nitrite: NEGATIVE
Protein, ur: NEGATIVE mg/dL
Specific Gravity, Urine: 1.021 (ref 1.005–1.030)
pH: 5 (ref 5.0–8.0)

## 2024-08-05 LAB — COMPREHENSIVE METABOLIC PANEL WITH GFR
ALT: 14 U/L (ref 0–44)
AST: 27 U/L (ref 15–41)
Albumin: 4.2 g/dL (ref 3.5–5.0)
Alkaline Phosphatase: 116 U/L (ref 38–126)
Anion gap: 12 (ref 5–15)
BUN: 9 mg/dL (ref 8–23)
CO2: 26 mmol/L (ref 22–32)
Calcium: 10.8 mg/dL — ABNORMAL HIGH (ref 8.9–10.3)
Chloride: 100 mmol/L (ref 98–111)
Creatinine, Ser: 0.89 mg/dL (ref 0.61–1.24)
GFR, Estimated: >60 mL/min (ref >60)
Glucose, Bld: 159 mg/dL — ABNORMAL HIGH (ref 70–99)
Potassium: 4 mmol/L (ref 3.5–5.1)
Sodium: 138 mmol/L (ref 135–145)
Total Bilirubin: 1.7 mg/dL — ABNORMAL HIGH (ref 0.0–1.2)
Total Protein: 6.6 g/dL (ref 6.5–8.1)

## 2024-08-05 LAB — TYPE AND SCREEN
ABO/RH(D): B POS
Antibody Screen: NEGATIVE

## 2024-08-05 LAB — PROTIME-INR
INR: 1.1 (ref 0.8–1.2)
Prothrombin Time: 15.1 s (ref 11.4–15.2)

## 2024-08-05 MED ORDER — DEXMEDETOMIDINE HCL IN NACL 400 MCG/100ML IV SOLN
0.1000 ug/kg/h | INTRAVENOUS | Status: AC
  Administered 2024-08-06: .7 ug/kg/h via INTRAVENOUS
  Administered 2024-08-06: 40.6 ug via INTRAVENOUS
  Filled 2024-08-05: qty 100

## 2024-08-05 MED ORDER — MAGNESIUM SULFATE 50 % IJ SOLN
40.0000 meq | INTRAMUSCULAR | Status: DC
  Filled 2024-08-05: qty 9.85

## 2024-08-05 MED ORDER — NOREPINEPHRINE 4 MG/250ML-% IV SOLN
0.0000 ug/min | INTRAVENOUS | Status: AC
  Administered 2024-08-06: 2 ug/min via INTRAVENOUS
  Filled 2024-08-05: qty 250

## 2024-08-05 MED ORDER — HEPARIN 30,000 UNITS/1000 ML (OHS) CELLSAVER SOLUTION
Status: DC
  Filled 2024-08-05: qty 1000

## 2024-08-05 MED ORDER — POTASSIUM CHLORIDE 2 MEQ/ML IV SOLN
80.0000 meq | INTRAVENOUS | Status: DC
  Filled 2024-08-05: qty 40

## 2024-08-05 MED ORDER — CEFAZOLIN SODIUM-DEXTROSE 2-4 GM/100ML-% IV SOLN
2.0000 g | INTRAVENOUS | Status: AC
  Administered 2024-08-06: 2 g via INTRAVENOUS
  Filled 2024-08-05: qty 100

## 2024-08-05 NOTE — Progress Notes (Signed)
 All consents signed by patient at PAT lab appointment. Pt was sent home with printed copy of surgical instructions and CHG soap/CHG soap instructions. All instructions reviewed with patient and questions answered.  Patients chart send to anesthesia for review. Pt denies any respiratory illness/infection in the last two months.   Device orders requested and received. Med[employer] rep emailed on 08/05/2024 with surgery, surgery date, pt arrival time, surgery start time, surgeons and device recommendations.

## 2024-08-05 NOTE — H&P (Signed)
 Patient ID: Cameron Daniel, male   DOB: 1945/11/05, 78 y.o.   MRN: [id number]  [facility]   [facility]   [address], [place] [address]             [phone]          CARDIOTHORACIC SURGERY ADMISSION HISTORY AND PHYSICAL  PCP is Cameron Butler DASEN, MD Referring Provider is Cameron Cash, MD Primary Cardiologist is Cameron Daniel Decent, MD   Reason for admission:  Severe aortic stenosis   HPI:   The patient is a history of hypertension, hyperlipidemia, type 2 diabetes, coronary artery disease status post CABG x 3 by me in 2020, ischemic cardiomyopathy with an ejection fraction of 25% status, ventricular tachycardia status post ablation and ICD placement, who was undergoing workup for a parathyroid  adenoma requiring parathyroidectomy by Dr. Eletha.  A heart murmur was Daniel on preop evaluation by anesthesia and a cardiology consultation was requested.  2D echocardiogram on 06/06/2024 showed a calcified aortic valve with restricted leaflet mobility.  The mean gradient was only 13 mmHg with a peak of 23 mmHg but a valve area by VTI of 0.84 cm.  Dimensionless index was 0.24 with a low stroke-volume index of 21 and a left ventricular ejection fraction of 25 to 30% with global hypokinesis.  Previous echocardiogram on 06/19/2023 showed an ejection fraction of 35 to 40% with a mean gradient of 9 mmHg across the aortic valve.  Echocardiogram in 07/2022 showed an ejection fraction of 40 to 45% with a severely calcified aortic valve without aortic stenosis.   He lives with his wife.  He reports marked exertional fatigue and progressive exertional shortness of breath with low-level activity.  He feels like sleeping all the time.  He has had some dizziness with standing up but no syncope.  He denies lower extremity edema and orthopnea.  He denies any chest pain or pressure.       Past Medical History:  Diagnosis Date   AICD (automatic  cardioverter/defibrillator) present     Allergic rhinitis, cause unspecified     Allergy      Anxiety     Arthritis     Cancer (HCC)      a couple places removed from eyelid   Carotid Doppler 04/2020    Carotid US  9/21: No evidence of ICA stenosis bilaterally; right vertebral artery stenosis   Cataract     Chronic combined systolic (congestive) and diastolic (congestive) heart failure (HCC)     Coronary atherosclerosis of unspecified type of vessel, native or graft      a. MI 1985 with unclear details. b. CABG 03/2019 with LIMA -> LAD, SVG -> OM, SVG to dRCA.   Depression      PTSD    Diverticulosis of colon (without mention of hemorrhage)     Esophageal reflux     Esophageal stricture     Essential hypertension     Former tobacco use     Hemorrhoids     Hyperlipidemia     ICD (implantable cardioverter-defibrillator) in place     Irritable bowel syndrome     Ischemic cardiomyopathy     Mild carotid artery disease      a. 1-39% bilaterally 03/2019 duplex.   Myocardial infarction (HCC)     Nocturia     Obesity, unspecified     Orthostasis     Other acquired absence of organ  Personal history of colonic polyps     Psychosexual dysfunction with inhibited sexual excitement     PTSD (post-traumatic stress disorder)     Retroperitoneal bleed     Type II or unspecified type diabetes mellitus without mention of complication, not stated as uncontrolled     VT (ventricular tachycardia) (HCC)                 Past Surgical History:  Procedure Laterality Date   ADENOIDECTOMY       CARPAL TUNNEL RELEASE        left    CHOLECYSTECTOMY       COLON RESECTION        18 inches   COLON SURGERY   2013   CORONARY ANGIOPLASTY        1985    CORONARY ARTERY BYPASS GRAFT N/A 04/26/2019    Procedure: CORONARY ARTERY BYPASS GRAFTING (CABG) x Three, using left internal mammary artery and right leg greater saphenous vein harvested endoscopically;  Surgeon: Cameron Dorise POUR, MD;  Location: [facility]  OR;  Service: Open Heart Surgery;  Laterality: N/A;   CORONARY/GRAFT ANGIOGRAPHY N/A 07/19/2024    Procedure: CORONARY/GRAFT ANGIOGRAPHY;  Surgeon: Cameron Cameron BIRCH, MD;  Location: [facility] INVASIVE CV LAB;  Service: Cardiovascular;  Laterality: N/A;   ELBOW BURSA SURGERY       EYE SURGERY       ICD GENERATOR CHANGEOUT N/A 12/28/2023    Procedure: ICD GENERATOR CHANGEOUT;  Surgeon: Cameron Ole DASEN, MD;  Location: [facility] INVASIVE CV LAB;  Service: Cardiovascular;  Laterality: N/A;   ICD IMPLANT N/A 06/25/2020    Procedure: ICD IMPLANT;  Surgeon: Cameron Ole DASEN, MD;  Location: [facility] INVASIVE CV LAB;  Service: Cardiovascular;  Laterality: N/A;   LEAD INSERTION N/A 12/28/2023    Procedure: LEAD INSERTION;  Surgeon: Cameron Ole DASEN, MD;  Location: [facility] INVAS[facility] CV LAB;  Service: Cardiovascular;  Laterality: N/A;   LEFT HEART CATH AND CORS/GRAFTS ANGIOGRAPHY N/A 06/19/2020    Procedure: LEFT HEART CATH AND CORS/GRAFTS ANGIOGRAPHY;  Surgeon: Cameron Debby LABOR, MD;  Location: [facility] INVAS[facility] CV LAB;  Service: Cardiovascular;  Laterality: N/A;   LEFT HEART CATH AND CORS/GRAFTS ANGIOGRAPHY N/A 03/08/2023    Procedure: LEFT HEART CATH AND CORS/GRAFTS ANGIOGRAPHY;  Surgeon: Cameron Deatrice LABOR, MD;  Location: [facility] INVAS[facility] CV LAB;  Service: Cardiovascular;  Laterality: N/A;   POLYPECTOMY       RIGHT/LEFT HEART CATH AND CORONARY ANGIOGRAPHY N/A 04/16/2019    Procedure: RIGHT/LEFT HEART CATH AND CORONARY ANGIOGRAPHY;  Surgeon: Cameron Victory ORN, MD;  Location: [facility] CV [facility];  Service: Cardiovascular;  Laterality: N/A;   SHOULDER SURGERY        Rt. Shoulder   TEE WITHOUT CARDIOVERSION N/A 04/26/2019    Procedure: TRANSESOPHAGEAL ECHOCARDIOGRAM (TEE);  Surgeon: Cameron Dorise POUR, MD;  Location: [facility] OR;  Service: Open Heart Surgery;  Laterality: N/A;   TONSILLECTOMY       V TACH ABLATION N/A 06/22/2020    Procedure: V TACH ABLATION;  Surgeon: Cameron Ole DASEN, MD;  Location: [facility] INVAS[facility] CV LAB;  Service: Cardiovascular;   Laterality: N/A;               Family History  Problem Relation Age of Onset   Lung cancer Father     Allergic rhinitis Father     Coronary artery disease Mother     Colon cancer Neg Hx     Heart attack Neg Hx     Hypertension Neg  Hx     Stroke Neg Hx     Esophageal cancer Neg Hx     Rectal cancer Neg Hx     Stomach cancer Neg Hx     Pancreatic cancer Neg Hx            Social History         Socioeconomic History   Marital status: Married      Spouse name: Not on file   Number of children: 1   Years of education: 12   Highest education level: High school graduate  Occupational History   Occupation: Therapist, Sports- retired  Tobacco Use   Smoking status: Former      Current packs/day: 0.00      Types: Cigarettes      Quit date: 08/30/1983      Years since quitting: 40.9   Smokeless tobacco: Former   Tobacco comments:      Quit 30 years ago.  Vaping Use   Vaping status: Never Used  Substance and Sexual Activity   Alcohol use: Not Currently      Comment: rare   Drug use: No   Sexual activity: Yes  Other Topics Concern   Not on file  Social History Narrative   Not on file    Social Drivers of Health        Financial Resource Strain: Low Risk  (12/22/2022)    Overall Financial Resource Strain (CARDIA)     Difficulty of Paying Living Expenses: Not hard at all  Food Insecurity: No Food Insecurity (12/29/2023)    Hunger Vital Sign     Worried About Running Out of Food in the Last Year: Never true     Ran Out of Food in the Last Year: Never true  Transportation Needs: No Transportation Needs (12/29/2023)    PRAPARE - Therapist, Art (Medical): No     Lack of Transportation (Non-Medical): No  Physical Activity: Insufficiently Active (12/22/2022)    Exercise Vital Sign     Days of Exercise per Week: 3 days     Minutes of Exercise per Session: 30 min  Stress: No Stress Concern Present (12/22/2022)    [employer] of Occupational Health -  Occupational Stress Questionnaire     Feeling of Stress : Not at all  Social Connections: Moderately Isolated (12/29/2023)    Social Connection and Isolation Panel     Frequency of Communication with Friends and Family: Once a week     Frequency of Social Gatherings with Friends and Family: Once a week     Attends Religious Services: Never     Database Administrator or Organizations: No     Attends Engineer, Structural: 1 to 4 times per year     Marital Status: Married  Catering Manager Violence: Not At Risk (12/29/2023)    Humiliation, Afraid, Rape, and Kick questionnaire     Fear of Current or Ex-Partner: No     Emotionally Abused: No     Physically Abused: No     Sexually Abused: No             Prior to Admission medications   Medication Sig Start Date End Date Taking? Authorizing Provider  acetaminophen  (TYLENOL ) 500 MG tablet Take 1,000 mg by mouth every 6 (six) hours as needed for mild pain (pain score 1-3).     Yes [provider]  aspirin  EC 81 MG tablet Take 1 tablet (81  mg total) by mouth daily. 05/16/19   Yes Dunn, Dayna N, PA-C  Azelastine  HCl 137 MCG/SPRAY SOLN PLACE 1 SPRAY INTO BOTH NOSTRILS 2 (TWO) TIMES DAILY DIRECTED 06/13/24   Yes Cameron Butler DASEN, MD  Cholecalciferol (VITAMIN D -3 PO) Take 1 tablet by mouth in the morning.     Yes [provider]  Cholecalciferol (VITAMIN D3 PO) Take by mouth.     Yes [provider]  cholestyramine  (QUESTRAN ) 4 g packet Take 0.5 packets (2 g total) by mouth 2 (two) times daily. Patient taking differently: Take 2 g by mouth 2 (two) times daily as needed. 08/15/23   Yes Cameron Butler DASEN, MD  citalopram  (CELEXA ) 10 MG tablet Take 1 tablet (10 mg total) by mouth daily. 06/13/24   Yes Cameron Butler DASEN, MD  clotrimazole  (LOTRIMIN ) 1 % cream Apply 1 Application topically 2 (two) times daily. 02/06/24   Yes Cameron Butler DASEN, MD  empagliflozin  (JARDIANCE ) 25 MG TABS tablet TAKE 25 MG (1 TABLET) BY MOUTH DAILY  BEFORE BREAKFAST. STOP PIOGLITAZONE  03/24/23   Yes Conte, Tessa N, PA-C  finasteride  (PROSCAR ) 5 MG tablet TAKE 1 TABLET BY MOUTH EVERY DAY 03/17/23   Yes Cameron Butler DASEN, MD  hydrocortisone  2.5 % cream Apply topically 2 (two) times daily. Patient taking differently: Apply 1 Application topically 2 (two) times daily as needed. 05/30/24   Yes Cameron Butler DASEN, MD  metoprolol  succinate (TOPROL  XL) 25 MG 24 hr tablet Take 0.5 tablets (12.5 mg total) by mouth daily. 05/28/24 05/28/25 Yes PickardButler DASEN, MD  montelukast  (SINGULAIR ) 10 MG tablet Take 1 tablet (10 mg total) by mouth at bedtime. 04/23/24   Yes Cameron Butler DASEN, MD  nitroGLYCERIN  (NITROSTAT ) 0.4 MG SL tablet Place 1 tablet (0.4 mg total) under the tongue every 5 (five) minutes x 3 doses as needed for chest pain. 12/23/23   Yes Weaver, Scott T, PA-C  olopatadine  (PATADAY ) 0.1 % ophthalmic solution Place 1 drop into both eyes 2 (two) times daily as needed for allergies.     Yes [provider]  pantoprazole  (PROTONIX ) 40 MG tablet TAKE 1 TABLET BY MOUTH TWICE A DAY 10/09/23   Yes Cameron Butler DASEN, MD  rosuvastatin  (CRESTOR ) 40 MG tablet Take 1 tablet (40 mg total) by mouth daily. 01/23/24   Yes Cameron Butler DASEN, MD  sitaGLIPtin  (JANUVIA ) 100 MG tablet Take 100 mg by mouth in the morning.     Yes [provider]  spironolactone  (ALDACTONE ) 25 MG tablet TAKE 1/2 TABLET BY MOUTH EVERY DAY 01/08/24   Yes O'Neal, Cameron Ned, MD  docusate sodium  (COLACE) 100 MG capsule Take 100 mg by mouth daily as needed for mild constipation. Patient not taking: Reported on 07/31/2024       [provider]  fexofenadine  (ALLEGRA ) 180 MG tablet Take 180 mg by mouth in the morning. Patient not taking: Reported on 07/31/2024       [provider]  MAGNESIUM  PO Take 1 tablet by mouth in the morning. Patient not taking: Reported on 07/31/2024       [provider]            Current Outpatient Medications  Medication Sig  Dispense Refill   acetaminophen  (TYLENOL ) 500 MG tablet Take 1,000 mg by mouth every 6 (six) hours as needed for mild pain (pain score 1-3).       aspirin  EC 81 MG tablet Take 1 tablet (81 mg total) by mouth daily. 90 tablet 3  Azelastine  HCl 137 MCG/SPRAY SOLN PLACE 1 SPRAY INTO BOTH NOSTRILS 2 (TWO) TIMES DAILY DIRECTED 30 mL 1   Cholecalciferol (VITAMIN D -3 PO) Take 1 tablet by mouth in the morning.       Cholecalciferol (VITAMIN D3 PO) Take by mouth.       cholestyramine  (QUESTRAN ) 4 g packet Take 0.5 packets (2 g total) by mouth 2 (two) times daily. (Patient taking differently: Take 2 g by mouth 2 (two) times daily as needed.) 60 each 3   citalopram  (CELEXA ) 10 MG tablet Take 1 tablet (10 mg total) by mouth daily. 90 tablet 3   clotrimazole  (LOTRIMIN ) 1 % cream Apply 1 Application topically 2 (two) times daily. 30 g 2   empagliflozin  (JARDIANCE ) 25 MG TABS tablet TAKE 25 MG (1 TABLET) BY MOUTH DAILY BEFORE BREAKFAST. STOP PIOGLITAZONE  90 tablet 0   finasteride  (PROSCAR ) 5 MG tablet TAKE 1 TABLET BY MOUTH EVERY DAY 90 tablet 3   hydrocortisone  2.5 % cream Apply topically 2 (two) times daily. (Patient taking differently: Apply 1 Application topically 2 (two) times daily as needed.) 30 g 0   metoprolol  succinate (TOPROL  XL) 25 MG 24 hr tablet Take 0.5 tablets (12.5 mg total) by mouth daily. 90 tablet 3   montelukast  (SINGULAIR ) 10 MG tablet Take 1 tablet (10 mg total) by mouth at bedtime. 90 tablet 0   nitroGLYCERIN  (NITROSTAT ) 0.4 MG SL tablet Place 1 tablet (0.4 mg total) under the tongue every 5 (five) minutes x 3 doses as needed for chest pain. 25 tablet 12   olopatadine  (PATADAY ) 0.1 % ophthalmic solution Place 1 drop into both eyes 2 (two) times daily as needed for allergies.       pantoprazole  (PROTONIX ) 40 MG tablet TAKE 1 TABLET BY MOUTH TWICE A DAY 180 tablet 3   rosuvastatin  (CRESTOR ) 40 MG tablet Take 1 tablet (40 mg total) by mouth daily. 90 tablet 3   sitaGLIPtin  (JANUVIA ) 100 MG  tablet Take 100 mg by mouth in the morning.       spironolactone  (ALDACTONE ) 25 MG tablet TAKE 1/2 TABLET BY MOUTH EVERY DAY 45 tablet 3   docusate sodium  (COLACE) 100 MG capsule Take 100 mg by mouth daily as needed for mild constipation. (Patient not taking: Reported on 07/31/2024)       fexofenadine  (ALLEGRA ) 180 MG tablet Take 180 mg by mouth in the morning. (Patient not taking: Reported on 07/31/2024)       MAGNESIUM  PO Take 1 tablet by mouth in the morning. (Patient not taking: Reported on 07/31/2024)          No current facility-administered medications for this visit.        Allergies       Allergies  Allergen Reactions   Doxazosin Mesylate Other (See Comments)      Drops in blood pressure    Losartan  Other (See Comments)      muscle pain   Testosterone  Other (See Comments)       Erythrocytosis     Flomax  [Tamsulosin  Hcl] Itching and Other (See Comments)      Drops in blood pressure      Penicillins Rash and Other (See Comments)      Has patient had a PCN reaction causing immediate rash, facial/tongue/throat swelling, SOB or lightheadedness with hypotension: No Has patient had a PCN reaction causing severe rash involving mucus membranes or skin necrosis: No Has patient had a PCN reaction that required hospitalization No Has patient had a PCN  reaction occurring within the last 10 years: No If all of the above answers are NO, then may proceed with Cephalosporin use.     Sulfonamide Derivatives Rash   Trulicity  [Dulaglutide ] Nausea And Vomiting      N&V, Dizziness            Review of Systems:               General:                      + decreased appetite, + decreased energy, no weight gain, + 70+ lb weight loss, no fever             Cardiac:                       no chest pain with exertion, no chest pain at rest, +SOB with mild exertion, no resting SOB, no PND, no orthopnea, no palpitations, no arrhythmia, no atrial fibrillation, no LE edema, + dizzy spells, no  syncope             Respiratory:                 + exertional shortness of breath, no home oxygen, no productive cough, no dry cough, no bronchitis, no wheezing, no hemoptysis, no asthma, no pain with inspiration or cough, no sleep apnea, no CPAP at night             GI:                               no difficulty swallowing, no reflux, no frequent heartburn, no hiatal hernia, no abdominal pain, no constipation, no diarrhea, no hematochezia, no hematemesis, no melena             GU:                              no dysuria,  no frequency, no urinary tract infection, no hematuria, no enlarged prostate, no kidney stones, no kidney disease             Vascular:                     no pain suggestive of claudication, no pain in feet, no leg cramps, no varicose veins, no DVT, no non-healing foot ulcer             Neuro:                         no stroke, no TIA's, no seizures, no headaches, no temporary blindness one eye,  no slurred speech, + peripheral neuropathy, no chronic pain, no instability of gait, no memory/cognitive dysfunction             Musculoskeletal:         no arthritis, no joint swelling, no myalgias, no difficulty walking, normal mobility              Skin:                            no rash, no itching, no skin infections, no pressure sores or ulcerations             Psych:  no anxiety, no depression, no nervousness, no unusual recent stress             Eyes:                           no blurry vision, no floaters, no recent vision changes, no glasses or contacts             ENT:                            no hearing loss, no loose or painful teeth, + dentures             Hematologic:               + easy bruising, no abnormal bleeding, no clotting disorder, no frequent epistaxis             Endocrine:                   + diabetes, does  check CBG's at home                            Physical Exam:               BP 106/64 (BP Location: Left Arm)   Pulse 69    Resp 18   Ht 5' 10 (1.778 m)   Wt 179 lb (81.2 kg)   SpO2 97%   BMI 25.68 kg/m              General:                      well-appearing             HEENT:                       Unremarkable, NCAT, PERLA, EOMI             Neck:                           no JVD, no bruits, no adenopathy              Chest:                          clear to auscultation, symmetrical breath sounds, no wheezes, no rhonchi              CV:                              RRR, 3/6 systolic murmur RSB, no diastolic murmur             Abdomen:                    soft, non-tender, no masses              Extremities:                 warm, well-perfused, pedal pulses palpable, no lower extremity edema             Rectal/GU                   Deferred  Neuro:                         Grossly non-focal and symmetrical throughout             Skin:                            Clean and dry, no rashes, no breakdown   Diagnostic Tests:   ECHOCARDIOGRAM REPORT       Patient Name:   Cameron Daniel Date of Exam: 06/04/2024  Medical Rec #:  [id number]          Height:       70.0 in  Accession #:    [id number]         Weight:       184.2 lb  Date of Birth:  [birth year]          BSA:          2.016 m  Patient Age:    78 years           BP:           112/72 mmHg  Patient Gender: M                  HR:           71 bpm.  Exam Location:  [address]   Procedure: 2D Echo, Color Doppler and Limited Color Doppler (Both Spectral  and            Color Flow Doppler were utilized during procedure).   Indications:    I35.0 Aortic Stenosis    History:        Patient has prior history of Echocardiogram examinations,  most                 recent 06/19/2023. Prior CABG; Risk Factors:Diabetes,                  Hypertension and HLD.    Sonographer:    Waldo Guadalajara RCS  Referring Phys: [id number] [place] L [place]   IMPRESSIONS     1. Left ventricular ejection fraction, by estimation, is 25 to 30%. The  left  ventricle has severely decreased function. The left ventricle  demonstrates global hypokinesis. There is severe concentric left  ventricular hypertrophy. Left ventricular  diastolic parameters are indeterminate.   2. Right ventricular systolic function is low normal. The right  ventricular size is normal. There is normal pulmonary artery systolic  pressure.   3. Left atrial size was mildly dilated.   4. Right atrial size was mildly dilated.   5. 2D MVA 4.06 cm2; calcified chords. The mitral valve is degenerative.  Mild mitral valve regurgitation. Severe mitral annular calcification.   6. Low stroke volume index with DVI 0.24; consistent with low flow low  gradient aortic stenosis. The aortic valve is calcified. Aortic valve  regurgitation is not visualized. Severe aortic valve stenosis. Aortic  valve area, by VTI measures 0.84 cm.   Comparison(s): Prior images reviewed side by side. Function and DVI are  worse from prior.   FINDINGS   Left Ventricle: Left ventricular ejection fraction, by estimation, is 25  to 30%. The left ventricle has severely decreased function. The left  ventricle demonstrates global hypokinesis. The left ventricular internal  cavity size was normal in size. There  is severe concentric left ventricular hypertrophy. Left ventricular  diastolic  parameters are indeterminate.   Right Ventricle: The right ventricular size is normal. No increase in  right ventricular wall thickness. Right ventricular systolic function is  low normal. There is normal pulmonary artery systolic pressure. The  tricuspid regurgitant velocity is 2.17 m/s,   and with an assumed right atrial pressure of 3 mmHg, the estimated right  ventricular systolic pressure is 21.8 mmHg.   Left Atrium: Left atrial size was mildly dilated.   Right Atrium: Right atrial size was mildly dilated.   Pericardium: There is no evidence of pericardial effusion.   Mitral Valve: 2D MVA 4.06 cm2; calcified  chords. The mitral valve is  degenerative in appearance. Severe mitral annular calcification. Mild  mitral valve regurgitation.   Tricuspid Valve: The tricuspid valve is normal in structure. Tricuspid  valve regurgitation is mild . No evidence of tricuspid stenosis.   Aortic Valve: Low stroke volume index with DVI 0.24; consistent with low  flow low gradient aortic stenosis. The aortic valve is calcified. Aortic  valve regurgitation is not visualized. Severe aortic stenosis is present.  Aortic valve mean gradient  measures 13.0 mmHg. Aortic valve peak gradient measures 22.7 mmHg. Aortic  valve area, by VTI measures 0.84 cm.   Pulmonic Valve: The pulmonic valve was normal in structure. Pulmonic valve  regurgitation is trivial.   Aorta: The aortic root and ascending aorta are structurally normal, with  no evidence of dilitation.   IAS/Shunts: The atrial septum is grossly normal.     LEFT VENTRICLE  PLAX 2D  LVIDd:         4.60 cm   Diastology  LVIDs:         3.70 cm   LV e' medial:    4.90 cm/s  LV PW:         1.50 cm   LV E/e' medial:  23.6  LV IVS:        1.60 cm   LV e' lateral:   3.70 cm/s  LVOT diam:     2.10 cm   LV E/e' lateral: 31.2  LV SV:         42  LV SV Index:   21  LVOT Area:     3.46 cm  LV IVRT:       63 msec     RIGHT VENTRICLE  RV Basal diam:  3.70 cm    PULMONARY VEINS  RV S prime:     6.53 cm/s  Systolic Velocity: 80.30 cm/s  TAPSE (M-mode): 1.6 cm  RVSP:           21.8 mmHg   LEFT ATRIUM             Index        RIGHT ATRIUM           Index  LA diam:        4.30 cm 2.13 cm/m   RA Pressure: 3.00 mmHg  LA Vol (A2C):   71.2 ml 35.32 ml/m  RA Area:     21.90 cm  LA Vol (A4C):   64.8 ml 32.15 ml/m  RA Volume:   62.90 ml  31.21 ml/m  LA Biplane Vol: 68.8 ml 34.13 ml/m   AORTIC VALVE  AV Area (Vmax):    0.88 cm  AV Area (Vmean):   0.90 cm  AV Area (VTI):     0.84 cm  AV Vmax:           238.00 cm/s  AV Vmean:  169.000 cm/s  AV VTI:             0.492 m  AV Peak Grad:      22.7 mmHg  AV Mean Grad:      13.0 mmHg  LVOT Vmax:         60.70 cm/s  LVOT Vmean:        43.800 cm/s  LVOT VTI:          0.120 m  LVOT/AV VTI ratio: 0.24    AORTA  Ao Root diam: 3.20 cm  Ao Asc diam:  2.60 cm   MITRAL VALVE                TRICUSPID VALVE                              TR Peak grad:   18.8 mmHg  V Decel Time:               TR Vmax:        217.00 cm/s  MR Peak grad: 36.7 mmHg     Estimated RAP:  3.00 mmHg  MR Mean grad: 24.0 mmHg     RVSP:           21.8 mmHg  MR Vmax:      303.00 cm/s  MR Vmean:     226.0 cm/s    SHUNTS  MV E velocity: 115.50 cm/s  Systemic VTI:  0.12 m                              Systemic Diam: 2.10 cm   Cameron Leavens MD  Electronically signed by Cameron Leavens MD  Signature Date/Time: 06/04/2024/2:24:33 PM        Final        rocedures   CORONARY/GRAFT ANGIOGRAPHY    Conclusion       Mid LM to Dist LM lesion is 50% stenosed.   Ost LAD lesion is 80% stenosed.   Prox LAD to Mid LAD lesion is 90% stenosed.   Mid LAD lesion is 95% stenosed.   Mid Cx to Dist Cx lesion is 95% stenosed.   Prox Cx to Mid Cx lesion is 100% stenosed.   Mid RCA lesion is 85% stenosed.   Prox Graft lesion is 20% stenosed.   SVG graft was visualized by angiography.   SVG graft was visualized by angiography.   LIMA graft was visualized by angiography.   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 Patent LIMA to LAD Chronic occlusion mid Circumflex. Patent vein graft to the obtuse marginal.  Severe mid RCA stenosis. Patent vein graft to the distal PDA   Continue workup for TAVR       Narrative & Impression  CLINICAL DATA:  Severe Aortic Stenosis.   EXAM: Cardiac TAVR CT   TECHNIQUE: A non-contrast, gated CT scan was obtained with axial slices of 2.5 mm through the heart for aortic valve scoring. A 120 kV retrospective, gated, contrast cardiac scan was obtained.  Gantry rotation speed was 230 msec and collimation was 0.63 mm. Nitroglycerin  was not given. A delayed scan was obtained to exclude left atrial appendage thrombus. The 3D dataset was reconstructed in systole with motion correction. The 3D data set was reconstructed in 5% intervals of the 0-95% of the R-R cycle. Systolic and  diastolic phases were analyzed on a dedicated workstation using MPR, MIP, and VRT modes. The patient received 100 cc of contrast.   FINDINGS: Aortic Valve: Valve Morphology: Tricuspid valve with minimal calcification but with reduced excursion the planimeter valve area is 1.26 cm2 sq cm consistent with moderate to severe aortic stenosis   Annular and subannular calcification: Mild   Aortic Valve Calcium  score: 1065   Perimembranous septal diameter: 5.3 mm   Mitral Valve: Severe mitral annular calcification. Mitral valve calcification and thickening calcification of the papillary muscles and chords.   Aortic Annulus Measurements-25% phase   Major annulus diameter: 30 mm   Minor annulus diameter:22 mm   Annular perimeter: 81 mm   Annular area: 4.90 cm2   Aortic Measurements- 75% phase   Sinotubular Junction: 29 mm   Ascending Thoracic Aorta: 32 mm   Descending Thoracic Aorta: 20 mm   Aortic atherosclerosis.   Sinus of Valsalva Measurements:   Right coronary cusp width: 32 mm   Left coronary cusp width: 33 mm   Non coronary cusp width: 33 mm   Coronary Artery Height above Annulus:   Left Main: 18 mm   Left SoV height: 24 mm   Right Coronary: 22 mm   Right SoV height: 24 mm   Optimum Fluoroscopic Angle for Delivery: LAO 2, CAU 2   Cusp overlay view angle: RAO 0, CAU 4   Valves for structural team consideration if TAVR is pursued:   26 mm Sapien   29 mm Evolut   Non TAVR Valve Findings:   Coronary Arteries: Normal coronary origin.  Prior CABG noted.   Systemic veins: Normal anatomy.   Main Pulmonary artery: Normal caliber.    Pulmonary veins: Normal anatomy.   Left atrial appendage: Distal tip filling defect. Delayed filling is favored over thrombus when comparing to calcium  score sequence. No delayed sequence for confirmation.   Interatrial septum: No clear communication.   Chamber dimensions: Right ventricular dilation. 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 There is no thrombus. Bi-atrial enlargement.   Pericardium: Trivial apical pericardial effusion.   CIED: Three leads are incompletely imaged in this study. A right atrial lead is seen from the SVC to the termination in the right atrial appendage. A right ventricular lead is seen form the SVC to the termination in the right ventricular apex. A coronary sinus lead is seen from the SVC through the coronary sinus and appears to terminate in the great cardiac vein. No clear fracture or perforation noted.   Extra Cardiac Findings as per separate reporting.   Image quality: Good   Noise artifact is: Limited to device lead artifact.   IMPRESSION: 1. Aortic stenosis. Measurements for potential interventions as above.   Cameron Leavens MD   Electronically Signed: By: Cameron Daniel M.D. On: 07/24/2024 16:52        Narrative & Impression  CLINICAL DATA:  Aortic valve replacement, preoperative evaluation   EXAM: CTA ABDOMEN AND PELVIS WITHOUT AND WITH CONTRAST   TECHNIQUE: Multidetector CT imaging of the abdomen and pelvis was performed using the standard protocol during bolus administration of intravenous contrast. Multiplanar reconstructed images and MIPs were obtained and reviewed to evaluate the vascular anatomy.   RADIATION DOSE REDUCTION: This exam was performed according to the departmental dose-optimization program which includes automated exposure control, adjustment of the mA and/or kV according to patient size and/or use of iterative reconstruction technique.   CONTRAST:  OMNIPAQUE  IOHEXOL  350  MG/ML SOLN  COMPARISON:  CT of the abdomen and pelvis performed Jan 03, 2023   FINDINGS: VASCULAR   Aorta: Patent with mild diffuse atherosclerotic changes. Minimal diameter is estimated at 11 mm.   Celiac: Patent.   SMA: Patent.   Renals: Patent.  Multiple renal arteries bilaterally.   IMA: Patent.   Inflow: The right common iliac artery is patent with minimal diameter estimated at 5.5 mm. Mild atherosclerotic changes. The right internal iliac artery is patent. The right external iliac artery is patent with mild atherosclerotic changes and minimal diameter estimated at 5 mm.   The left common iliac artery is patent with minimal diameter estimated at 6 mm. Left internal iliac artery is patent. The left external iliac artery is patent with minimal diameter estimated at 6 mm.   Proximal Outflow: The right common femoral artery is patent with minimal diameter estimated at 6 mm.   The left common femoral artery is patent with posterior and medial calcified plaque and minimal diameter estimated at 4.5 mm.   Veins: Incidental note is made of early venous filling in the proximal right lower extremity and right iliac system (relative to the absence of enhancement of the venous structures on the left).   Review of the MIP images confirms the above findings.   NON-VASCULAR   Lower chest: Reported separately.   Hepatobiliary: Postsurgical changes from cholecystectomy. Mildly nodular liver surface contour.   Pancreas: Unremarkable. No pancreatic ductal dilatation or surrounding inflammatory changes.   Spleen: Normal in size without focal abnormality.   Adrenals/Urinary Tract: Adrenal glands are unremarkable. Kidneys are normal, without renal calculi, focal lesion, or hydronephrosis. Bladder is decompressed.   Stomach/Bowel: Scattered colonic diverticulosis.   Lymphatic: No evidence of significant lymphadenopathy.   Reproductive: Unchanged.   Other: Previously  observed heterogeneous fat containing density in the right retroperitoneum is similar in size measuring 8.6 x 5.2 cm. Image 167 of CT series 309.   Musculoskeletal: Degenerative changes in the lumbar spine.   IMPRESSION: 1. Minimal vessel diameters are detailed above. 2. Early venous enhancement in the right pelvis which suggests the presence of a right lower extremity AV fistula. Consideration can be given toward further evaluation by arterial duplex ultrasound for further characterization.     Electronically Signed   By: Maude Naegeli M.D.   On: 07/26/2024 11:51            Impression:   This 78 year old gentleman has symptomatic, stage D2 low-flow/low gradient severe aortic stenosis with NYHA class III symptoms of exertional fatigue and shortness of breath consistent with chronic systolic and diastolic congestive heart failure.  I personally reviewed his 2D echocardiogram, cardiac catheterization, and CTA studies.  His echocardiogram shows a severely calcified and thickened aortic valve with markedly restricted leaflet mobility.  The mean gradient was only 13 mmHg but the valve area by VTI was 0.84 with a dimensionless index of 0.24 and a very low stroke-volume index of 21 with an ejection fraction of 25%. His LVEF has gone down over the past 2 years from 45% to 25%.  Cardiac catheterization shows patent bypass grafts.  I agree that aortic valve replacement is the best treatment for this patient for improvement of his symptoms and congestive heart failure and to prevent further deterioration of cardiac function.  Given his age and prior coronary bypass surgery I think transcatheter aortic valve replacement is the best option for treating him.  His gated cardiac CTA shows anatomy suitable for TAVR using either an Celestia STANLEY or  Medtronic Evolut valve.  His abdominal and pelvic CTA shows adequate pelvic vascular anatomy to allow transfemoral insertion.  There is early venous filling in the  proximal right lower extremity and right iliac system compared to the left side suggesting an AV fistula.  He had a cardiac catheterization in 2021 through the right groin that was complicated by a groin bleed and significant hematoma formation after the procedure which may have led to an AV fistula.  This has never been investigated.   The patient and his wife were counseled at length regarding treatment alternatives for management of severe symptomatic aortic stenosis. The risks and benefits of surgical intervention has been discussed in detail. Long-term prognosis with medical therapy was discussed. Alternative approaches such as conventional surgical aortic valve replacement, transcatheter aortic valve replacement, and palliative medical therapy were compared and contrasted at length. This discussion was placed in the context of the patient's own specific clinical presentation and past medical history. All of their questions have been addressed.    Following the decision to proceed with transcatheter aortic valve replacement, a discussion was held regarding what types of management strategies would be attempted intraoperatively in the event of life-threatening complications, including whether or not the patient would be considered a candidate for the use of cardiopulmonary bypass and/or conversion to open sternotomy for attempted surgical intervention.  Given his age and prior coronary bypass surgery with an ejection fraction of 25% I do not think he is a candidate for emergent sternotomy to manage any intraoperative complications.  The patient has been advised of a variety of complications that might develop including but not limited to risks of death, stroke, paravalvular leak, aortic dissection or other major vascular complications, aortic annulus rupture, device embolization, cardiac rupture or perforation, mitral regurgitation, acute myocardial infarction, arrhythmia, heart block or bradycardia  requiring permanent pacemaker placement, congestive heart failure, respiratory failure, renal failure, pneumonia, infection, other late complications related to structural valve deterioration or migration, or other complications that might ultimately cause a temporary or permanent loss of functional independence or other long term morbidity. The patient provides full informed consent for the procedure as described and all questions were answered.     Plan:   Left transfemoral TAVR using a Medtronic valve on 08/06/2024.  We will plan to use the left radial artery for secondary access and direct LV pacing.  He will not need femoral venous access since he is not a bailout candidate.   Dorise LOIS Fellers, MD

## 2024-08-06 ENCOUNTER — Other Ambulatory Visit: Payer: Self-pay

## 2024-08-06 ENCOUNTER — Inpatient Hospital Stay (HOSPITAL_COMMUNITY)
Admission: RE | Admit: 2024-08-06 | Discharge: 2024-08-07 | DRG: 267 | Disposition: A | Attending: Internal Medicine | Admitting: Internal Medicine

## 2024-08-06 ENCOUNTER — Encounter (HOSPITAL_COMMUNITY): Admission: RE | Disposition: A | Payer: Self-pay | Attending: Internal Medicine

## 2024-08-06 ENCOUNTER — Inpatient Hospital Stay (HOSPITAL_COMMUNITY): Admitting: Anesthesiology

## 2024-08-06 ENCOUNTER — Encounter (HOSPITAL_COMMUNITY): Payer: Self-pay | Admitting: Internal Medicine

## 2024-08-06 ENCOUNTER — Inpatient Hospital Stay (HOSPITAL_COMMUNITY)

## 2024-08-06 DIAGNOSIS — I25119 Atherosclerotic heart disease of native coronary artery with unspecified angina pectoris: Secondary | ICD-10-CM | POA: Diagnosis not present

## 2024-08-06 DIAGNOSIS — Z87891 Personal history of nicotine dependence: Secondary | ICD-10-CM | POA: Diagnosis not present

## 2024-08-06 DIAGNOSIS — I255 Ischemic cardiomyopathy: Secondary | ICD-10-CM | POA: Diagnosis not present

## 2024-08-06 DIAGNOSIS — I2510 Atherosclerotic heart disease of native coronary artery without angina pectoris: Secondary | ICD-10-CM

## 2024-08-06 DIAGNOSIS — I5020 Unspecified systolic (congestive) heart failure: Secondary | ICD-10-CM | POA: Diagnosis not present

## 2024-08-06 DIAGNOSIS — K219 Gastro-esophageal reflux disease without esophagitis: Secondary | ICD-10-CM | POA: Diagnosis present

## 2024-08-06 DIAGNOSIS — Z8249 Family history of ischemic heart disease and other diseases of the circulatory system: Secondary | ICD-10-CM | POA: Diagnosis not present

## 2024-08-06 DIAGNOSIS — E7849 Other hyperlipidemia: Secondary | ICD-10-CM | POA: Diagnosis not present

## 2024-08-06 DIAGNOSIS — E785 Hyperlipidemia, unspecified: Secondary | ICD-10-CM | POA: Diagnosis present

## 2024-08-06 DIAGNOSIS — Z79899 Other long term (current) drug therapy: Secondary | ICD-10-CM | POA: Diagnosis not present

## 2024-08-06 DIAGNOSIS — J45909 Unspecified asthma, uncomplicated: Secondary | ICD-10-CM | POA: Diagnosis present

## 2024-08-06 DIAGNOSIS — I358 Other nonrheumatic aortic valve disorders: Secondary | ICD-10-CM | POA: Diagnosis not present

## 2024-08-06 DIAGNOSIS — Z951 Presence of aortocoronary bypass graft: Secondary | ICD-10-CM | POA: Diagnosis not present

## 2024-08-06 DIAGNOSIS — Z801 Family history of malignant neoplasm of trachea, bronchus and lung: Secondary | ICD-10-CM | POA: Diagnosis not present

## 2024-08-06 DIAGNOSIS — E669 Obesity, unspecified: Secondary | ICD-10-CM | POA: Diagnosis not present

## 2024-08-06 DIAGNOSIS — Z006 Encounter for examination for normal comparison and control in clinical research program: Secondary | ICD-10-CM | POA: Diagnosis not present

## 2024-08-06 DIAGNOSIS — I350 Nonrheumatic aortic (valve) stenosis: Secondary | ICD-10-CM

## 2024-08-06 DIAGNOSIS — Z9181 History of falling: Secondary | ICD-10-CM | POA: Diagnosis not present

## 2024-08-06 DIAGNOSIS — I110 Hypertensive heart disease with heart failure: Secondary | ICD-10-CM | POA: Diagnosis not present

## 2024-08-06 DIAGNOSIS — Z95810 Presence of automatic (implantable) cardiac defibrillator: Secondary | ICD-10-CM | POA: Diagnosis present

## 2024-08-06 DIAGNOSIS — I472 Ventricular tachycardia, unspecified: Secondary | ICD-10-CM | POA: Diagnosis not present

## 2024-08-06 DIAGNOSIS — E1169 Type 2 diabetes mellitus with other specified complication: Secondary | ICD-10-CM | POA: Diagnosis not present

## 2024-08-06 DIAGNOSIS — M5416 Radiculopathy, lumbar region: Secondary | ICD-10-CM | POA: Diagnosis present

## 2024-08-06 DIAGNOSIS — I5042 Chronic combined systolic (congestive) and diastolic (congestive) heart failure: Secondary | ICD-10-CM | POA: Diagnosis not present

## 2024-08-06 DIAGNOSIS — I10 Essential (primary) hypertension: Secondary | ICD-10-CM | POA: Diagnosis present

## 2024-08-06 DIAGNOSIS — Z9861 Coronary angioplasty status: Secondary | ICD-10-CM | POA: Diagnosis not present

## 2024-08-06 DIAGNOSIS — Z952 Presence of prosthetic heart valve: Secondary | ICD-10-CM | POA: Diagnosis not present

## 2024-08-06 DIAGNOSIS — Z860100 Personal history of colon polyps, unspecified: Secondary | ICD-10-CM | POA: Diagnosis not present

## 2024-08-06 DIAGNOSIS — F4310 Post-traumatic stress disorder, unspecified: Secondary | ICD-10-CM | POA: Diagnosis not present

## 2024-08-06 DIAGNOSIS — Z7982 Long term (current) use of aspirin: Secondary | ICD-10-CM | POA: Diagnosis not present

## 2024-08-06 DIAGNOSIS — Z7984 Long term (current) use of oral hypoglycemic drugs: Secondary | ICD-10-CM | POA: Diagnosis not present

## 2024-08-06 HISTORY — PX: INTRAOPERATIVE TRANSTHORACIC ECHOCARDIOGRAM: SHX6523

## 2024-08-06 LAB — POCT ACTIVATED CLOTTING TIME
Activated Clotting Time: 286 s
Activated Clotting Time: 133 s

## 2024-08-06 LAB — GLUCOSE, CAPILLARY
Glucose-Capillary: 115 mg/dL — ABNORMAL HIGH (ref 70–99)
Glucose-Capillary: 117 mg/dL — ABNORMAL HIGH (ref 70–99)
Glucose-Capillary: 137 mg/dL — ABNORMAL HIGH (ref 70–99)
Glucose-Capillary: 140 mg/dL — ABNORMAL HIGH (ref 70–99)

## 2024-08-06 LAB — POCT I-STAT, CHEM 8
BUN: 8 mg/dL (ref 8–23)
Calcium, Ion: 1.33 mmol/L (ref 1.15–1.40)
Chloride: 102 mmol/L (ref 98–111)
Creatinine, Ser: 0.6 mg/dL — ABNORMAL LOW (ref 0.61–1.24)
Glucose, Bld: 166 mg/dL — ABNORMAL HIGH (ref 70–99)
HCT: 41 % (ref 39.0–52.0)
Hemoglobin: 13.9 g/dL (ref 13.0–17.0)
Potassium: 3.6 mmol/L (ref 3.5–5.1)
Sodium: 138 mmol/L (ref 135–145)
TCO2: 23 mmol/L (ref 22–32)

## 2024-08-06 SURGERY — TRANSCATHETER AORTIC VALVE REPLACEMENT, TRANSFEMORAL (CATHLAB)
Anesthesia: Monitor Anesthesia Care

## 2024-08-06 MED ORDER — MORPHINE SULFATE (PF) 2 MG/ML IV SOLN: 1.0000 mg | INTRAVENOUS | Status: DC | PRN

## 2024-08-06 MED ORDER — TRAMADOL HCL 50 MG PO TABS: 50.0000 mg | ORAL_TABLET | ORAL | Status: DC | PRN

## 2024-08-06 MED ORDER — SODIUM CHLORIDE 0.9 % IV BOLUS: 300.0000 mL | Freq: Once | INTRAVENOUS | Status: DC

## 2024-08-06 MED ORDER — CHLORHEXIDINE GLUCONATE 0.12 % MT SOLN
15.0000 mL | Freq: Once | OROMUCOSAL | Status: AC
  Administered 2024-08-06: 15 mL via OROMUCOSAL
  Filled 2024-08-06: qty 15

## 2024-08-06 MED ORDER — ASPIRIN 81 MG PO TBEC
81.0000 mg | DELAYED_RELEASE_TABLET | Freq: Every day | ORAL | Status: DC
  Administered 2024-08-07: 81 mg via ORAL
  Filled 2024-08-06: qty 1

## 2024-08-06 MED ORDER — ACETAMINOPHEN 650 MG RE SUPP: 650.0000 mg | Freq: Four times a day (QID) | RECTAL | Status: DC | PRN

## 2024-08-06 MED ORDER — SODIUM CHLORIDE 0.9 % IV SOLN: INTRAVENOUS | Status: DC | PRN

## 2024-08-06 MED ORDER — FINASTERIDE 5 MG PO TABS
5.0000 mg | ORAL_TABLET | Freq: Every day | ORAL | Status: DC
  Administered 2024-08-07: 5 mg via ORAL
  Filled 2024-08-06: qty 1

## 2024-08-06 MED ORDER — PROPOFOL 500 MG/50ML IV EMUL
INTRAVENOUS | Status: DC | PRN
  Administered 2024-08-06: 30 ug/kg/min via INTRAVENOUS

## 2024-08-06 MED ORDER — CHLORHEXIDINE GLUCONATE 4 % EX SOLN
60.0000 mL | Freq: Once | CUTANEOUS | Status: DC
  Filled 2024-08-06: qty 60

## 2024-08-06 MED ORDER — PANTOPRAZOLE SODIUM 40 MG PO TBEC
40.0000 mg | DELAYED_RELEASE_TABLET | Freq: Two times a day (BID) | ORAL | Status: DC
  Administered 2024-08-06 – 2024-08-07 (×2): 40 mg via ORAL
  Filled 2024-08-06 (×2): qty 1

## 2024-08-06 MED ORDER — SODIUM CHLORIDE 0.9% FLUSH
3.0000 mL | Freq: Two times a day (BID) | INTRAVENOUS | Status: DC
  Administered 2024-08-07 (×2): 3 mL via INTRAVENOUS

## 2024-08-06 MED ORDER — LIDOCAINE HCL (PF) 1 % IJ SOLN
INTRAMUSCULAR | Status: AC
  Filled 2024-08-06: qty 30

## 2024-08-06 MED ORDER — INSULIN ASPART 100 UNIT/ML IJ SOLN: 0.0000 [IU] | Freq: Three times a day (TID) | INTRAMUSCULAR | Status: DC

## 2024-08-06 MED ORDER — MONTELUKAST SODIUM 10 MG PO TABS
10.0000 mg | ORAL_TABLET | Freq: Every day | ORAL | Status: DC
  Administered 2024-08-06: 10 mg via ORAL
  Filled 2024-08-06: qty 1

## 2024-08-06 MED ORDER — PHENYLEPHRINE HCL (PRESSORS) 10 MG/ML IV SOLN
INTRAVENOUS | Status: DC | PRN
  Administered 2024-08-06 (×2): 80 ug via INTRAVENOUS

## 2024-08-06 MED ORDER — HEPARIN (PORCINE) IN NACL 1000-0.9 UT/500ML-% IV SOLN
INTRAVENOUS | Status: DC | PRN
  Administered 2024-08-06 (×3): 500 mL

## 2024-08-06 MED ORDER — CHLORHEXIDINE GLUCONATE CLOTH 2 % EX PADS: 6.0000 | MEDICATED_PAD | Freq: Once | CUTANEOUS | Status: DC

## 2024-08-06 MED ORDER — NOREPINEPHRINE 4 MG/250ML-% IV SOLN: 0.0000 ug/min | INTRAVENOUS | Status: DC

## 2024-08-06 MED ORDER — VERAPAMIL HCL 2.5 MG/ML IV SOLN
INTRAVENOUS | Status: DC | PRN
  Administered 2024-08-06: 10 mL via INTRA_ARTERIAL

## 2024-08-06 MED ORDER — ONDANSETRON HCL 4 MG/2ML IJ SOLN: 4.0000 mg | Freq: Four times a day (QID) | INTRAMUSCULAR | Status: DC | PRN

## 2024-08-06 MED ORDER — SODIUM CHLORIDE 0.9% FLUSH: 3.0000 mL | INTRAVENOUS | Status: DC | PRN

## 2024-08-06 MED ORDER — CIPROFLOXACIN IN D5W 400 MG/200ML IV SOLN: 400.0000 mg | INTRAVENOUS | Status: DC

## 2024-08-06 MED ORDER — CEFAZOLIN SODIUM-DEXTROSE 2-4 GM/100ML-% IV SOLN
2.0000 g | Freq: Three times a day (TID) | INTRAVENOUS | Status: AC
  Administered 2024-08-07 (×2): 2 g via INTRAVENOUS
  Filled 2024-08-06 (×2): qty 100

## 2024-08-06 MED ORDER — IODIXANOL 320 MG/ML IV SOLN
INTRAVENOUS | Status: DC | PRN
  Administered 2024-08-06: 80 mL via INTRA_ARTERIAL

## 2024-08-06 MED ORDER — HEPARIN SODIUM (PORCINE) 1000 UNIT/ML IJ SOLN
INTRAMUSCULAR | Status: DC | PRN
  Administered 2024-08-06: 12000 [IU] via INTRAVENOUS

## 2024-08-06 MED ORDER — ACETAMINOPHEN 325 MG PO TABS
650.0000 mg | ORAL_TABLET | Freq: Four times a day (QID) | ORAL | Status: DC | PRN
  Administered 2024-08-07: 650 mg via ORAL
  Filled 2024-08-06: qty 2

## 2024-08-06 MED ORDER — SODIUM CHLORIDE 0.9 % IV SOLN: INTRAVENOUS | Status: DC

## 2024-08-06 MED ORDER — CHLORHEXIDINE GLUCONATE 4 % EX SOLN: 30.0000 mL | CUTANEOUS | Status: DC

## 2024-08-06 MED ORDER — SODIUM CHLORIDE 0.9 % IV SOLN: 250.0000 mL | INTRAVENOUS | Status: DC | PRN

## 2024-08-06 MED ORDER — CITALOPRAM HYDROBROMIDE 20 MG PO TABS
10.0000 mg | ORAL_TABLET | Freq: Every day | ORAL | Status: DC
  Administered 2024-08-07: 10 mg via ORAL
  Filled 2024-08-06: qty 1

## 2024-08-06 MED ORDER — CHLORHEXIDINE GLUCONATE 4 % EX SOLN: 60.0000 mL | Freq: Once | CUTANEOUS | Status: DC

## 2024-08-06 MED ORDER — SODIUM CHLORIDE 0.9 % IV SOLN: INTRAVENOUS | Status: AC

## 2024-08-06 MED ORDER — OXYCODONE HCL 5 MG PO TABS: 5.0000 mg | ORAL_TABLET | ORAL | Status: DC | PRN

## 2024-08-06 MED ORDER — LIDOCAINE HCL (PF) 1 % IJ SOLN
INTRAMUSCULAR | Status: DC | PRN
  Administered 2024-08-06: 2 mL
  Administered 2024-08-06: 12 mL

## 2024-08-06 MED ORDER — NITROGLYCERIN IN D5W 200-5 MCG/ML-% IV SOLN: 0.0000 ug/min | INTRAVENOUS | Status: DC

## 2024-08-06 MED ORDER — VERAPAMIL HCL 2.5 MG/ML IV SOLN
INTRAVENOUS | Status: AC
  Filled 2024-08-06: qty 2

## 2024-08-06 MED ORDER — ROSUVASTATIN CALCIUM 20 MG PO TABS
40.0000 mg | ORAL_TABLET | Freq: Every day | ORAL | Status: DC
  Administered 2024-08-07: 40 mg via ORAL
  Filled 2024-08-06: qty 2

## 2024-08-06 MED ORDER — INSULIN ASPART 100 UNIT/ML IJ SOLN: 0.0000 [IU] | INTRAMUSCULAR | Status: DC | PRN

## 2024-08-06 MED ORDER — PROTAMINE SULFATE 10 MG/ML IV SOLN
INTRAVENOUS | Status: DC | PRN
  Administered 2024-08-06: 120 mg via INTRAVENOUS

## 2024-08-06 SURGICAL SUPPLY — 33 items
BAG SNAP BAND KOVER 36X36 (MISCELLANEOUS) ×4 IMPLANT
CABLE SURGICAL S-101-97-12 (CABLE) IMPLANT
CATH DIAG 6FR JR4 (CATHETERS) IMPLANT
CATH DIAG 6FR PIGTAIL ANGLED (CATHETERS) IMPLANT
CATH INFINITI 5 FR STR PIGTAIL (CATHETERS) IMPLANT
CATH INFINITI 6F AL1 (CATHETERS) IMPLANT
CLOSURE PERCLOSE PROSTYLE (Vascular Products) IMPLANT
DEVICE RAD COMP TR BAND LRG (VASCULAR PRODUCTS) IMPLANT
ELECT DEFIB PAD ADLT CADENCE (PAD) IMPLANT
GLIDESHEATH SLEND SS 6F .021 (SHEATH) IMPLANT
GUIDEWIRE ANGLED .035 180CM (WIRE) IMPLANT
PACK CARDIAC CATHETERIZATION (CUSTOM PROCEDURE TRAY) ×2 IMPLANT
PAD SORBX EP SHIELD 16.5X12 (MISCELLANEOUS) IMPLANT
SET ATX-X65L (MISCELLANEOUS) IMPLANT
SHEATH DRYSEAL FLEX 18FR 33CM (SHEATH) IMPLANT
SHEATH DRYSEAL FLEX 22FR 33CM (SHEATH) IMPLANT
SHEATH PINNACLE 8F 10CM (SHEATH) IMPLANT
SHEATH PINNACLE 9F 10CM (SHEATH) IMPLANT
SHEATH PROBE COVER 6X72 (BAG) IMPLANT
STOPCOCK MORSE 400PSI 3WAY (MISCELLANEOUS) ×4 IMPLANT
SYR CONTROL 10ML ANGIOGRAPHIC (SYRINGE) IMPLANT
SYSTEM EVOLUT FX DELIVERY 34 (CATHETERS) IMPLANT
SYSTEM EVOLUT FX LOADING 34 (CATHETERS) IMPLANT
TRANSDUCER W/STOPCOCK (MISCELLANEOUS) IMPLANT
TUBING ART PRESS 72 MALE/FEM (TUBING) IMPLANT
TUBING CIL FLEX 10 FLL-RA (TUBING) IMPLANT
VALVE EVOLUT FX+ TRANS 34 (Valve) IMPLANT
WIRE AMPLATZ SS-J .035X180CM (WIRE) IMPLANT
WIRE EMERALD 3MM-J .035X150CM (WIRE) IMPLANT
WIRE EMERALD 3MM-J .035X260CM (WIRE) IMPLANT
WIRE EMERALD ST .035X260CM (WIRE) IMPLANT
WIRE MICRO SET SILHO 5FR 7 (SHEATH) IMPLANT
WIRE SAFARI SM CURVE 275 (WIRE) IMPLANT

## 2024-08-06 NOTE — Progress Notes (Signed)
  Echocardiogram 2D Echocardiogram has been performed.  Cameron Daniel 08/06/2024, 6:44 PM

## 2024-08-06 NOTE — Op Note (Signed)
 [facility]  TAVR OPERATIVE NOTE   Date of Procedure:  08/06/2024  Preoperative Diagnosis: Severe Aortic Stenosis   Postoperative Diagnosis: Same   Procedure:   Transcatheter Aortic Valve Replacement - Transfemoral Approach  Medtronic Evolut FX + (size 34 mm, serial # [id number] )   Co-Surgeons:  Lurena Red, MD and Dorise LOIS Fellers, MD   Anesthesiologist:  Leonce  Echocardiographer:  Delford  Pre-operative Echo Findings: Severe low-flow low gradient aortic stenosis Severe left ventricular systolic dysfunction  Post-operative Echo Findings: No paravalvular leak Unchanged left ventricular systolic function  Left Hearth Catheterization:  LVEDP      BRIEF CLINICAL NOTE AND INDICATIONS FOR SURGERY  The patient is a 78 year old male with a history of coronary artery disease status post CABG with a LIMA to LAD, vein graft to obtuse marginal, and vein graft to PDA, ischemic cardiomyopathy ejection fraction 25%, VT status post ablation and ICD, hypertension, type 2 diabetes, hyperlipidemia and low-flow low gradient symptomatic aortic stenosis who is referred for an elective transcatheter aortic valve replacement with a 34 mm Evolut FX+ bioprosthesis.  During the course of the patient's preoperative work up they have been evaluated comprehensively by a multidisciplinary team of specialists coordinated through the Multid[facility] in the [facility].  They have been demonstrated to suffer from symptomatic severe aortic stenosis as noted above. The patient has been counseled extensively as to the relative risks and benefits of all options for the treatment of severe aortic stenosis including long term medical therapy, conventional surgery for aortic valve replacement, and transcatheter aortic valve replacement.  The patient has been independently evaluated by Dr. Fellers with CT surgery and they are felt to be at high risk for  conventional surgical aortic valve replacement. The surgeon indicated the patient would be a poor candidate for conventional surgery. Based upon review of all of the patient's preoperative diagnostic tests they are felt to be candidate for transcatheter aortic valve replacement using the transfemoral approach as an alternative to high risk conventional surgery.    Following the decision to proceed with transcatheter aortic valve replacement, a discussion has been held regarding what types of management strategies would be attempted intraoperatively in the event of life-threatening complications, including whether or not the patient would be considered a candidate for the use of cardiopulmonary bypass and/or conversion to open sternotomy for attempted surgical intervention.  The patient has been advised of a variety of complications that might develop peculiar to this approach including but not limited to risks of death, stroke, paravalvular leak, aortic dissection or other major vascular complications, aortic annulus rupture, device embolization, cardiac rupture or perforation, acute myocardial infarction, arrhythmia, heart block or bradycardia requiring permanent pacemaker placement, congestive heart failure, respiratory failure, renal failure, pneumonia, infection, other late complications related to structural valve deterioration or migration, or other complications that might ultimately cause a temporary or permanent loss of functional independence or other long term morbidity.  The patient provides full informed consent for the procedure as described and all questions were answered preoperatively.    DETAILS OF THE OPERATIVE PROCEDURE  PREPARATION:   The patient is brought to the operating room on the above mentioned date and central monitoring was established by the anesthesia team including placement of a radial arterial line. The patient is placed in the supine position on the operating table.   Intravenous antibiotics are administered. Conscious sedation is used.   Baseline transthoracic echocardiogram was performed. The  patient's chest, abdomen, both groins, and both lower extremities are prepared and draped in a sterile manner. A time out procedure is performed.   PERIPHERAL ACCESS:   Using the modified Seldinger technique, radial arterial access was obtained with placement of a 6 Fr sheath in the left radial artery using u/s guidance.  A pigtail diagnostic catheter was passed through the femoral arterial sheath under fluoroscopic guidance into the aortic root.  Aortic root angiography was performed in order to determine the optimal angiographic angle for valve deployment.  TRANSFEMORAL ACCESS:  A micropuncture kit was used to gain access to the left femoral artery using u/s guidance. Position confirmed with angiography. Pre-closure with double ProGlide closure devices. The patient was heparinized systemically and ACT verified > 250 seconds.    A 2 Fr transfemoral Dry Seal sheath was introduced into the left femoral artery over an Amplatz superstiff wire. An AL-1 catheter was used to direct a straight-tip exchange length wire across the native aortic valve into the left ventricle. This was exchanged out for a pigtail catheter and position was confirmed in the LV apex. Simultaneous LV and Ao pressures were recorded.  The pigtail catheter was then exchanged for an Safari wire in the LV apex. Direct LV pacing thresholds were tested and found to be adequate.  TRANSCATHETER HEART VALVE DEPLOYMENT:  A Medtronic Evolut FX+ THV size 34 mm was prepared and per manufacturer's guidelines, and the proper orientation of the valve is confirmed on the delivery system.  The valve was advanced through the introducer sheath into the descending aorta. The valve was then advanced across the aortic arch. The valve was carefully positioned across the aortic valve annulus. Once final position of the valve has  been confirmed with angiographic assessment in the cusp overlap view and in the LAO view, the valve is deployed with controlled rapid pacing. The valve is taken to 80% deployment and appropriate depth is confirmed. The valve is then released from each paddle. There is no valvular leak and no central aortic insufficiency.  The patient's hemodynamic recovery following valve deployment is good.  Echo demostrated acceptable post-procedural gradients, stable mitral valve function, and no AI.   PROCEDURE COMPLETION:  The sheath was then removed and closure devices were completed. Protamine  was administered once femoral arterial repair was complete. The tpigtail catheters and femoral sheaths were removed with TR band  closure device placed to manage the left radial artery.  The patient tolerated the procedure well and is transported to the surgical intensive care in stable condition. There were no immediate intraoperative complications. All sponge instrument and needle counts are verified correct at completion of the operation.   No blood products were administered during the operation.  The patient received a total of 80 mL of intravenous contrast during the procedure.  Zamere Pasternak K Zaneta Lightcap MD 08/06/2024 7:22 PM

## 2024-08-06 NOTE — Anesthesia Postprocedure Evaluation (Signed)
 Anesthesia Post Note  Patient: DEAGLAN LILE  Procedure(s) Performed: Transcatheter Aortic Valve Replacement, Transfemoral (Left) ECHOCARDIOGRAM, TRANSTHORACIC     Patient location during evaluation: Cath Lab Anesthesia Type: MAC Level of consciousness: awake and alert, oriented and patient cooperative Pain management: pain level controlled Vital Signs Assessment: post-procedure vital signs reviewed and stable Respiratory status: spontaneous breathing, nonlabored ventilation and respiratory function stable Cardiovascular status: stable Postop Assessment: no apparent nausea or vomiting Anesthetic complications: no   There were no known notable events for this encounter.  Last Vitals:  Vitals:   08/06/24 2050 08/06/24 2055  BP: (!) 86/52 (!) 87/52  Pulse: 60 60  Resp: (!) 0 15  Temp:    SpO2: 98% 95%    Last Pain:  Vitals:   08/06/24 1947  TempSrc: Axillary  PainSc:                  Raevyn Sokol,E. Maycie Luera

## 2024-08-06 NOTE — Anesthesia Preprocedure Evaluation (Addendum)
 Anesthesia Evaluation  Patient identified by MRN, date of birth, ID band Patient awake    Reviewed: Allergy  & Precautions, NPO status , Patient's Chart, lab work & pertinent test results, reviewed documented beta blocker date and time   History of Anesthesia Complications Negative for: history of anesthetic complications  Airway Mallampati: II  TM Distance: >3 FB     Dental  (+) Edentulous Upper, Edentulous Lower   Pulmonary shortness of breath and with exertion, asthma , former smoker   breath sounds clear to auscultation       Cardiovascular hypertension, + angina with exertion + CAD, + Past MI and +CHF  + dysrhythmias + pacemaker + Cardiac Defibrillator + Valvular Problems/Murmurs AS  Rhythm:Regular + Systolic murmurs  Mid LM to Dist LM lesion is 50% stenosed.   Ost LAD lesion is 80% stenosed.   Prox LAD to Mid LAD lesion is 90% stenosed.   Mid LAD lesion is 95% stenosed.   Mid Cx to Dist Cx lesion is 95% stenosed.   Prox Cx to Mid Cx lesion is 100% stenosed.   Mid RCA lesion is 85% stenosed.   Prox Graft lesion is 20% stenosed.   SVG graft was visualized by angiography.   SVG graft was visualized by angiography.   LIMA graft was visualized by angiography.  IMPRESSIONS     1. Left ventricular ejection fraction, by estimation, is 25 to 30%. The  left ventricle has severely decreased function. The left ventricle  demonstrates global hypokinesis. There is severe concentric left  ventricular hypertrophy. Left ventricular  diastolic parameters are indeterminate.   2. Right ventricular systolic function is low normal. The right  ventricular size is normal. There is normal pulmonary artery systolic  pressure.   3. Left atrial size was mildly dilated.   4. Right atrial size was mildly dilated.   5. 2D MVA 4.06 cm2; calcified chords. The mitral valve is degenerative.  Mild mitral valve regurgitation. Severe mitral  annular calcification.   6. Low stroke volume index with DVI 0.24; consistent with low flow low  gradient aortic stenosis. The aortic valve is calcified. Aortic valve  regurgitation is not visualized. Severe aortic valve stenosis. Aortic  valve area, by VTI measures 0.84 cm.      Neuro/Psych  PSYCHIATRIC DISORDERS Anxiety Depression     Neuromuscular disease    GI/Hepatic ,GERD  ,,(+) neg Cirrhosis        Endo/Other  diabetes, Type 2    Renal/GU Renal disease     Musculoskeletal  (+) Arthritis ,    Abdominal   Peds  Hematology  (+) Blood dyscrasia, anemia   Anesthesia Other Findings   Reproductive/Obstetrics                              Anesthesia Physical Anesthesia Plan  ASA: 4  Anesthesia Plan: MAC   Post-op Pain Management:    Induction: Intravenous  PONV Risk Score and Plan: 1 and Ondansetron   Airway Management Planned: Natural Airway and Simple Face Mask  Additional Equipment:   Intra-op Plan:   Post-operative Plan:   Informed Consent: I have reviewed the patients History and Physical, chart, labs and discussed the procedure including the risks, benefits and alternatives for the proposed anesthesia with the patient or authorized representative who has indicated his/her understanding and acceptance.     Dental advisory given  Plan Discussed with: CRNA  Anesthesia Plan Comments:  Anesthesia Quick Evaluation

## 2024-08-06 NOTE — Discharge Instructions (Signed)

## 2024-08-06 NOTE — Discharge Summary (Incomplete)
 HEART AND VASCULAR CENTER   MULTIDISCIPLINARY HEART VALVE TEAM  Discharge Summary    Patient ID: Cameron Daniel MRN: [id number]; DOB: [birth year]  Admit date: 08/06/2024 Discharge date: 08/06/2024  PCP:  Cameron Butler DASEN, MD  [facility] Cardiologist:  Cameron Daniel Decent, MD  [facility] Structural heart: Cameron MARLA Red, MD [facility] Electrophysiologist:  Cameron Daniel HOLTS, MD   Discharge Diagnoses    Active Problems:   Type 2 diabetes mellitus with hyperlipidemia (HCC)   HLD (hyperlipidemia)   HYPERTENSION   Coronary artery disease involving native coronary artery of native heart with angina pectoris   GERD (gastroesophageal reflux disease)   HFrEF (heart failure with reduced ejection fraction) (HCC)   S/P CABG x 3   Lumbar radiculopathy   Asthma without status asthmaticus   Cardiac resynchronization therapy defibrillator (CRT-D) in place   Allergies Allergies  Allergen Reactions   Doxazosin Mesylate Other (See Comments)    Drops in blood pressure    Losartan  Other (See Comments)    muscle pain   Testosterone  Other (See Comments)     Erythrocytosis    Flomax  [Tamsulosin  Hcl] Itching and Other (See Comments)    Drops in blood pressure     Penicillins Rash and Other (See Comments)    Has patient had a PCN reaction causing immediate rash, facial/tongue/throat swelling, SOB or lightheadedness with hypotension: No Has patient had a PCN reaction causing severe rash involving mucus membranes or skin necrosis: No Has patient had a PCN reaction that required hospitalization No Has patient had a PCN reaction occurring within the last 10 years: No If all of the above answers are NO, then may proceed with Cephalosporin use.    Sulfonamide Derivatives Rash   Trulicity  [Dulaglutide ] Nausea And Vomiting    N&V, Dizziness    Diagnostic Studies/Procedures    *** _____________    Echo 08/07/24: completed but pending formal read at the time of discharge    History of Present Illness     Cameron Daniel is a 78 y.o. male with a history of CAD s/p 3V CABG, ischemic cardiomyopathy, chronic systolic CHF, VT s/p ablation with ICD in place, HTN, DMT2, HLD, parathyroid  adenoma requiring parathyroidectomy and severe LFLG AS who presented to [facility] on 08/06/24 for planned TAVR.   He had a NSTEMI in 03/2019 and was found to have three vessel CAD. He underwent 3V CABG (LIMA to LAD, SVG to OM, SVG to RCA) in August 2020. Echo in 2020 with LVEF 25-30%. He was admitted with VT in 2021 with stable CAD on cath at that time and had a VT ablation and ICD placed. Echo in 2023 with LVEF 40-45%. Cardiac cath July 2024 following an abnormal Cardiac PET/CT with 3 patent grafts. Echo in October 2024 with LVEF 35-40% and moderate aortic stenosis. He has done well over the last year. He was seen in our office 04/30/24 by Cameron Louis, NP and had no complaints with a pending parathyroid  surgery for a nodule c/w parathyroid  adenoma. Echo October 2025 with LVEF 25-30% with global hypokinesis, severe MAC with mild MR and severe low flow, low gradient aortic stenosis with mean gradient 13 mmHg, AVA 0.84 cm2, SVI 24, DI 0.24. His parathyroid  surgery has been delayed with his valve issue. Cath 07/19/24 with three vessel CAD with 3/3 patent grafts.   The patient was evaluated by the multidisciplinary valve team and felt to have severe, symptomatic aortic stenosis and to be a suitable candidate for TAVR, which  was set up for 08/07/27.   Hospital Course     Consultants: none   Severe AS:  -- S/p TAVR with a *** mm Edwards Sapien 3 Ultra Resilia *** THV via the TF*** approach on ***.  -- Post operative echo completed but pending formal read. -- Groin sites are stable.  -- ECG with *** and no high grade heart block. -- Continue Asprin 81mg  daily. -- Met with cardiac rehab to discuss CRP phase II.  -- Plan for discharge home today with close follow up in the outpatient setting.    HTN: -- BP well controlled. ***.  -- Treated in the context of CHF GDMT.   HFrEF/ischemic CM: -- EF ***.  -- S/p ICD.  -- Continue Jardiance  25mg  daily, Toprol  XL 12.5mg  daily, Spiro 12.5mg  daily.  Ventricular tachycardia s/p ICD: -- Continue follow up with EP.    HLD: -- LDL 35 on 11/2023 and well-controlled. -- Continue rosuvastatin  40 mg daily.  DMT2:  -- Treated with SSI while admitted.  -- Resume home meds at discharge.   Right lower extremity AV fistula: -- Noted on pre TAVR CTs.  -- Will order arterial duplex ultrasound for further characterization as an outpatient.   _____________  Discharge Vitals Blood pressure 132/81, pulse 77, temperature (!) 97.5 F (36.4 C), temperature source Oral, resp. rate 20, height 5' 10 (1.778 m), weight 80.3 kg, SpO2 99%.  Filed Weights   08/06/24 1053  Weight: 80.3 kg     GEN: Well nourished, well developed in no acute distress NECK: No JVD CARDIAC: ***RRR, no murmurs, rubs, gallops RESPIRATORY:  Clear to auscultation without rales, wheezing or rhonchi  ABDOMEN: Soft, non-tender, non-distended EXTREMITIES:  No edema; No deformity.  Groin sites clear without hematoma or ecchymosis. ****   Disposition   Pt is being discharged home today in good condition.  Follow-up Plans & Appointments     Follow-up Information     Sebastian Lamarr SAUNDERS, PA-C. Go on 08/12/2024.   Specialties: Cardiology, Radiology Why: @ 10:45am, please arrive at least 20 minutes early. Contact information: [address] [place] [address] [phone]                  Discharge Medications   Allergies as of 08/06/2024       Reactions   Doxazosin Mesylate Other (See Comments)   Drops in blood pressure    Losartan  Other (See Comments)   muscle pain   Testosterone  Other (See Comments)    Erythrocytosis   Flomax  [tamsulosin  Hcl] Itching, Other (See Comments)   Drops in blood pressure    Penicillins Rash, Other (See  Comments)   Has patient had a PCN reaction causing immediate rash, facial/tongue/throat swelling, SOB or lightheadedness with hypotension: No Has patient had a PCN reaction causing severe rash involving mucus membranes or skin necrosis: No Has patient had a PCN reaction that required hospitalization No Has patient had a PCN reaction occurring within the last 10 years: No If all of the above answers are NO, then may proceed with Cephalosporin use.   Sulfonamide Derivatives Rash   Trulicity  [dulaglutide ] Nausea And Vomiting   N&V, Dizziness     Med Rec must be completed prior to using this [facility]***           Outstanding Labs/Studies   ***  ______________________  Duration of Discharge Encounter: APP Time: *** minutes    Signed, Lamarr Sebastian, PA-C 08/06/2024, 3:57 PM [phone]

## 2024-08-06 NOTE — Progress Notes (Signed)
 Dr. Leonce by; ordered to administer remainder of 0.9NS(300cc) at 999cc/hr.

## 2024-08-06 NOTE — Anesthesia Procedure Notes (Signed)
 Procedure Name: MAC Date/Time: 08/06/2024 5:42 PM  Performed by: Arvell Edsel HERO, CRNAPre-anesthesia Checklist: Patient identified, Emergency Drugs available, Suction available, Patient being monitored and Timeout performed Patient Re-evaluated:Patient Re-evaluated prior to induction Oxygen Delivery Method: Simple face mask

## 2024-08-06 NOTE — Op Note (Signed)
 HEART AND VASCULAR CENTER   MULTIDISCIPLINARY HEART VALVE TEAM     TAVR OPERATIVE NOTE   NAME@ [id number]  Date of Procedure:                 08/06/2024   Preoperative Diagnosis:      Severe Aortic Stenosis    Postoperative Diagnosis:    Same    Procedure:        Transcatheter Aortic Valve Replacement - Percutaneous *** Transfemoral Approach             Medtronic Evolut FX  (size *** mm, serial # ***)              Co-Surgeons:            Dorise LOIS Fellers, MD and ***, MD     Anesthesiologist:                  ***, MD   Echocardiographer:              ***, MD   Pre-operative Echo Findings: Severe aortic stenosis  Normal left ventricular systolic function   Post-operative Echo Findings: *** paravalvular leak Normal left ventricular systolic function      BRIEF CLINICAL NOTE AND INDICATIONS FOR SURGERY   ***        DETAILS OF THE OPERATIVE PROCEDURE    PREPARATION:    The patient was brought to the operating room on the above mentioned date and appropriate monitoring was established by the anesthesia team. The patient was placed in the supine position on the operating table.  Intravenous antibiotics were administered. The patient was monitored closely throughout the procedure under conscious sedation.***  General endotracheal anesthesia was induced uneventfully.  Baseline transesophageal***transthoracic*** echocardiogram was performed. The patient's abdomen and both groins were prepped and draped in a sterile manner. A time out procedure was performed.   PERIPHERAL ACCESS:    Using the modified Seldinger technique, femoral arterial and venous access was obtained with placement of 6 Fr sheaths on the *** side.  A pigtail diagnostic catheter was passed through the *** arterial sheath under fluoroscopic guidance into the aortic root.  A temporary transvenous pacemaker catheter was passed through the *** femoral venous sheath under fluoroscopic guidance into the right  ventricle.  The pacemaker was tested to ensure stable lead placement and pacemaker capture. Aortic root angiography was performed in order to determine the optimal angiographic angle for valve deployment.    TRANSFEMORAL ACCESS:    Percutaneous transfemoral access and sheath placement was performed using ultrasound guidance.  The *** common femoral artery was cannulated using a micropuncture needle.  A pair of Abbott Perclose percutaneous closure devices were placed and a 6 French sheath replaced into the femoral artery.  The patient was heparinized systemically and ACT verified > 250 seconds.     An 18 Fr transfemoral Gore Dry-Seal sheath was introduced into the *** femoral artery after progressively dilating over an Amplatz superstiff wire. An AL-1 catheter was used to direct a straight-tip exchange length wire across the native aortic valve into the left ventricle. This was exchanged out for a pigtail catheter and position was confirmed in the LV apex. Simultaneous LV and Ao pressures were recorded.  The pigtail catheter was exchanged for a Safari wire in the LV apex.    BALLOON AORTIC VALVULOPLASTY:    Not performed   TRANSCATHETER HEART VALVE DEPLOYMENT:    A Medtronic Evolut FX transcatheter heart valve (size ***  mm) was prepared and loaded into the delivery catheter system per manufacturer's guidelines and the proper orientation of the valve is confirmed under fluoroscopy. The delivery system and inline sheath were inserted into the right common femoral artery over the [facility] wire and the inline sheath advanced into the abdominal aorta under fluoroscopic guidance. The delivery catheter was advanced around the aortic arch and the valve was carefully positioned across the aortic valve annulus. An aortic root injection was performed to confirm position and the valve deployed using cusp overlap technique under fluoroscopic guidance. Intermittent pacing was used during valve deployment. The delivery  system and guidewire were retracted into the descending aorta and the nosecone re-sheathed. Valve function was assessed using echocardiography. There is felt to be *** paravalvular leak and no central aortic insufficiency. The patient's hemodynamic recovery following valve deployment is good.        PROCEDURE COMPLETION:    The delivery system and in-line sheath were removed and femoral artery closure performed using the Perclose devices.  Protamine  was administered once femoral arterial repair was complete. The temporary pacemaker, pigtail catheters and femoral sheaths were removed with manual pressure used venous for hemostasis and a Mynx closure device for contralateral arterial hemostasis.    The patient tolerated the procedure well and is transported to the cath lab recovery area in stable condition. There were no immediate intraoperative complications. All sponge instrument and needle counts are verified correct at completion of the operation.    No blood products were administered during the operation.   The patient received a total of *** mL of intravenous contrast during the procedure.     Dorise MARLA Fellers, MD

## 2024-08-06 NOTE — Progress Notes (Signed)
 Spoke with Delon KIDD' Rosalynn, Danielle Draper and Rockey Cornea about needing the ICD rep present before surgery. It was determine that a magnet would be used during the procedure and reprogramming of the ICD was not needed.

## 2024-08-06 NOTE — Interval H&P Note (Signed)
 History and Physical Interval Note:  08/06/2024 5:21 PM  Cameron Daniel  has presented today for surgery, with the diagnosis of Severe Aortic Stenosis.  The various methods of treatment have been discussed with the patient and family. After consideration of risks, benefits and other options for treatment, the patient has consented to  Procedure(s): Transcatheter Aortic Valve Replacement, Transfemoral (Left) ECHOCARDIOGRAM, TRANSTHORACIC (N/A) as a surgical intervention.  The patient's history has been reviewed, patient examined, no change in status, stable for surgery.  I have reviewed the patient's chart and labs.  Questions were answered to the patient's satisfaction.     Mercedez Boule K Durwin Davisson

## 2024-08-06 NOTE — Transfer of Care (Signed)
 Immediate Anesthesia Transfer of Care Note  Patient: Cameron Daniel  Procedure(s) Performed: Transcatheter Aortic Valve Replacement, Transfemoral (Left) ECHOCARDIOGRAM, TRANSTHORACIC  Patient Location: Cath Lab  Anesthesia Type:MAC  Level of Consciousness: awake and alert   Airway & Oxygen Therapy: Patient Spontanous Breathing  Post-op Assessment: Report given to RN and Post -op Vital signs reviewed and stable  Post vital signs: Reviewed and stable  Last Vitals:  Vitals Value Taken Time  BP 89/57 08/06/24 19:20  Temp 36.7 C 08/06/24 19:17  Pulse 60 08/06/24 19:20  Resp 25 08/06/24 19:20  SpO2 92 % 08/06/24 19:20  Vitals shown include unfiled device data.  Last Pain:  Vitals:   08/06/24 1917  TempSrc: Axillary  PainSc: Asleep         Complications: There were no known notable events for this encounter.

## 2024-08-07 ENCOUNTER — Inpatient Hospital Stay (HOSPITAL_COMMUNITY)

## 2024-08-07 ENCOUNTER — Encounter (HOSPITAL_COMMUNITY): Payer: Self-pay | Admitting: Internal Medicine

## 2024-08-07 DIAGNOSIS — Z952 Presence of prosthetic heart valve: Secondary | ICD-10-CM

## 2024-08-07 LAB — GLUCOSE, CAPILLARY
Glucose-Capillary: 105 mg/dL — ABNORMAL HIGH (ref 70–99)
Glucose-Capillary: 150 mg/dL — ABNORMAL HIGH (ref 70–99)

## 2024-08-07 LAB — CBC
HCT: 43.7 % (ref 39.0–52.0)
Hemoglobin: 13.9 g/dL (ref 13.0–17.0)
MCH: 27.7 pg (ref 26.0–34.0)
MCHC: 31.8 g/dL (ref 30.0–36.0)
MCV: 87.1 fL (ref 80.0–100.0)
Platelets: 96 K/uL — ABNORMAL LOW (ref 150–400)
RBC: 5.02 MIL/uL (ref 4.22–5.81)
RDW: 15.7 % — ABNORMAL HIGH (ref 11.5–15.5)
WBC: 5.7 K/uL (ref 4.0–10.5)
nRBC: 0 % (ref 0.0–0.2)

## 2024-08-07 LAB — ECHOCARDIOGRAM COMPLETE
AR max vel: 2.21 cm2
AV Area VTI: 2.08 cm2
AV Area mean vel: 2.18 cm2
AV Mean grad: 6 mmHg
AV Peak grad: 10.9 mmHg
Ao pk vel: 1.65 m/s
Area-P 1/2: 3.61 cm2
Calc EF: 46.4 %
Height: 70 in
MV VTI: 1.9 cm2
S' Lateral: 4.3 cm
Single Plane A2C EF: 45 %
Single Plane A4C EF: 49.1 %
Weight: 3142.88 [oz_av]

## 2024-08-07 LAB — BASIC METABOLIC PANEL WITH GFR
Anion gap: 8 (ref 5–15)
BUN: 7 mg/dL — ABNORMAL LOW (ref 8–23)
CO2: 28 mmol/L (ref 22–32)
Calcium: 9.9 mg/dL (ref 8.9–10.3)
Chloride: 103 mmol/L (ref 98–111)
Creatinine, Ser: 0.67 mg/dL (ref 0.61–1.24)
GFR, Estimated: >60 mL/min (ref >60)
Glucose, Bld: 124 mg/dL — ABNORMAL HIGH (ref 70–99)
Potassium: 3.9 mmol/L (ref 3.5–5.1)
Sodium: 139 mmol/L (ref 135–145)

## 2024-08-07 LAB — MAGNESIUM: Magnesium: 1.3 mg/dL — ABNORMAL LOW (ref 1.7–2.4)

## 2024-08-07 MED ORDER — MAGNESIUM SULFATE IN D5W 1-5 GM/100ML-% IV SOLN
1.0000 g | Freq: Once | INTRAVENOUS | Status: AC
  Administered 2024-08-07: 1 g via INTRAVENOUS
  Filled 2024-08-07: qty 100

## 2024-08-07 NOTE — Progress Notes (Signed)
 Patient received d/c instructions from d/c RN, vascular sites were C/D/I, had no any concerns during d/c, patient received a RW at bedside, spouse will transport pt home.

## 2024-08-07 NOTE — Progress Notes (Signed)
 Mobility Specialist Progress Note;   08/07/24 0857  Mobility  Activity Ambulated with assistance  Level of Assistance Standby assist, set-up cues, supervision of patient - no hands on  Assistive Device Front wheel walker  Distance Ambulated (ft) 250 ft  Activity Response Tolerated well  Mobility Referral Yes  Mobility visit 1 Mobility  Mobility Specialist Start Time (ACUTE ONLY) 0857  Mobility Specialist Stop Time (ACUTE ONLY) 0912  Mobility Specialist Time Calculation (min) (ACUTE ONLY) 15 min   Pt pleasant and agreeable to mobility. Required no physical assistance during ambulation, SV for safety. VSS throughout and no c/o when asked. No bleeding at site when checked. Pt returned to bed and left with all needs met.   Lauraine Erm Mobility Specialist Please contact via Secure[employer] or [employer] [phone]

## 2024-08-07 NOTE — Progress Notes (Signed)
 Discussed with pt restrictions, exercise guidelines, and CRPII. Pt receptive. He has a bad knee but will work on walking. Gave DM diet and reviewed. Not interested in CRPII at this time. Gave brochure. [phone] Aliene Aris BS, ACSM-CEP 08/07/2024 10:28 AM

## 2024-08-07 NOTE — Progress Notes (Signed)
°  Echocardiogram 2D Echocardiogram has been performed.  Cameron Daniel 08/07/2024, 9:50 AM

## 2024-08-07 NOTE — Progress Notes (Signed)
 Discharge instructions (including medications) discussed with and copy provided to patient/caregiver

## 2024-08-07 NOTE — TOC Transition Note (Signed)
 Transition of Care (TOC) - Discharge Note Rayfield Gobble RN, BSN Inpatient Care Management Unit 4E- RN Case Manager See Treatment Team for direct phone #   Patient Details  Name: Cameron Daniel MRN: [id number] Date of Birth: [birth year]  Transition of Care [name]) CM/SW Contact:  Gobble Rayfield Hurst, RN Phone Number: 08/07/2024, 12:30 PM   Clinical Narrative:    Pt s/p TAVR, stable for transition home today, per bedside RN pt asking for RW- order has been placed per attending team.  DME Referral sent to [employer] via [employer] and msg to [employer] as well. RW to be delivered to pt prior to discharge.   Spouse to transport home.   IP CM interventions have been completed no further needs noted.   Final next level of care: Home/Self Care Barriers to Discharge: No Barriers Identified   Patient Goals and CMS Choice Patient states their goals for this hospitalization and ongoing recovery are:: return home   Choice offered to / list presented to : Patient      Discharge Placement                 Home      Discharge Plan and Services Additional resources added to the After Visit Summary for     Discharge Planning Services: CM Consult Post Acute Care Choice: Durable Medical Equipment          DME Arranged: Vannie rolling DME Agency: [facility] Date DME Agency Contacted: 08/07/24 Time DME Agency Contacted: 1130 Representative spoke with at DME Agency: [facility] HH Arranged: NA HH Agency: NA        Social Drivers of Health (SDOH) Interventions SDOH Screenings   Food Insecurity: No Food Insecurity (12/29/2023)  Housing: Unknown (04/10/2024)   Received from [facility]  Transportation Needs: No Transportation Needs (12/29/2023)  Utilities: Not At Risk (12/29/2023)  Alcohol Screen: Low Risk  (12/22/2022)  Depression (PHQ2-9): Low Risk  (03/18/2024)  Recent Concern: Depression (PHQ2-9) - Medium Risk (02/06/2024)  Financial Resource Strain:  Low Risk  (12/22/2022)  Physical Activity: Insufficiently Active (12/22/2022)  Social Connections: Moderately Isolated (12/29/2023)  Stress: No Stress Concern Present (12/22/2022)  Tobacco Use: Medium Risk (08/06/2024)     Readmission Risk Interventions    08/07/2024   12:30 PM  Readmission Risk Prevention Plan  Post Dischage Appt Complete  Medication Screening Complete  Transportation Screening Complete

## 2024-08-07 NOTE — Plan of Care (Signed)

## 2024-08-08 ENCOUNTER — Telehealth: Payer: Self-pay | Admitting: *Deleted

## 2024-08-08 ENCOUNTER — Telehealth: Payer: Self-pay

## 2024-08-08 NOTE — Transitions of Care (Post Inpatient/ED Visit) (Signed)
 08/08/2024  Name: Cameron Daniel MRN: [id number] DOB: [birth year]  Today's TOC FU Call Status: Today's TOC FU Call Status:: Successful TOC FU Call Completed TOC FU Call Complete Date: 08/08/24  Patient's Name and Date of Birth confirmed. Name, DOB  Transition Care Management Follow-up Telephone Call Date of Discharge: 08/07/24 Discharge Facility: [facility]) Type of Discharge: Inpatient Admission Primary Inpatient Discharge Diagnosis:: S/P TAVR (transcatheter aortic valve replacement) How have you been since you were released from the hospital?: Better (feeling okay   appetite fair and getting better, no issues with bowel/ bladder, ambulating with walker) Any questions or concerns?: No  Items Reviewed: Did you receive and understand the discharge instructions provided?: Yes Any new allergies since your discharge?: No Dietary orders reviewed?: Yes Type of Diet Ordered:: heart healthy,  carbohydrate modified Do you have support at home?: Yes People in Home [RPT]: spouse Name of Support/Comfort Primary Source: Rickey Shephered  Medications Reviewed Today: Medications Reviewed Today     Reviewed by Aura Mliss LABOR, RN (Registered Nurse) on 08/08/24 at 0920  Med List Status: <None>   Medication Order Taking? Sig Documenting Provider Last Dose Status Informant  acetaminophen  (TYLENOL ) 500 MG tablet [id number] Yes Take 1,000 mg by mouth every 6 (six) hours as needed for mild pain (pain score 1-3). [provider]  Active Self  aspirin  EC 81 MG tablet [id number] Yes Take 1 tablet (81 mg total) by mouth daily. Dunn, Dayna N, PA-C  Active Self  citalopram  (CELEXA ) 10 MG tablet [id number] Yes Take 1 tablet (10 mg total) by mouth daily. Duanne Butler DASEN, MD  Active Self  docusate sodium  (COLACE) 100 MG capsule [id number] Yes Take 100-200 mg by mouth 2 (two) times daily as needed (constipation). [provider]  Active Self  empagliflozin  (JARDIANCE ) 25 MG TABS  tablet [id number] Yes TAKE 25 MG (1 TABLET) BY MOUTH DAILY BEFORE BREAKFAST. STOP PIOGLITAZONE  Lucien Orren SAILOR, PA-C  Active Self  finasteride  (PROSCAR ) 5 MG tablet [id number] Yes TAKE 1 TABLET BY MOUTH EVERY DAY Duanne Butler DASEN, MD  Active Self  metoprolol  succinate (TOPROL  XL) 25 MG 24 hr tablet [id number] Yes Take 0.5 tablets (12.5 mg total) by mouth daily. Duanne Butler DASEN, MD  Active Self  montelukast  (SINGULAIR ) 10 MG tablet [id number] Yes Take 1 tablet (10 mg total) by mouth at bedtime. Duanne Butler DASEN, MD  Active Self  nitroGLYCERIN  (NITROSTAT ) 0.4 MG SL tablet [id number] Yes Place 1 tablet (0.4 mg total) under the tongue every 5 (five) minutes x 3 doses as needed for chest pain. Lelon Hamilton T, PA-C  Active Self  olopatadine  (PATADAY ) 0.1 % ophthalmic solution [id number] Yes Place 1 drop into both eyes 2 (two) times daily as needed for allergies. [provider]  Active Self  pantoprazole  (PROTONIX ) 40 MG tablet [id number] Yes TAKE 1 TABLET BY MOUTH TWICE A DAY Duanne Butler DASEN, MD  Active Self  rosuvastatin  (CRESTOR ) 40 MG tablet [id number] Yes Take 1 tablet (40 mg total) by mouth daily. Duanne Butler DASEN, MD  Active Self  sitaGLIPtin  (JANUVIA ) 100 MG tablet [id number] Yes Take 100 mg by mouth in the morning. [provider]  Active Self  spironolactone  (ALDACTONE ) 25 MG tablet [id number] Yes TAKE 1/2 TABLET BY MOUTH EVERY DAY O'Neal, Darryle Ned, MD  Active Self            Home Care and Equipment/Supplies: Were Home Health Services Ordered?: No  Functional Questionnaire: Do you need assistance with getting out of bed/getting  out of a chair/moving?: Yes Do you have difficulty managing or taking your medications?: No  Follow up appointments reviewed: PCP Follow-up appointment confirmed?: No (pt declines for RN CM to schedule post hospital appointment) MD Provider Line Number:[phone] Given: No Specialist Hospital Follow-up appointment confirmed?: Yes Date of  Specialist follow-up appointment?: 08/15/24 Do you need transportation to your follow-up appointment?: No Do you understand care options if your condition(s) worsen?: Yes-patient verbalized understanding  SDOH Interventions Today    Flowsheet Row Most Recent Value  SDOH Interventions   Food Insecurity Interventions Intervention Not Indicated  Housing Interventions Intervention Not Indicated  Transportation Interventions Intervention Not Indicated  Utilities Interventions Intervention Not Indicated     Goals Addressed             This Visit's Progress    VBCI Transitions of Care (TOC) Care Plan       Problems:  Recent Hospitalization for treatment of s/p TAVR  Goal:  Over the next 30 days, the patient will not experience hospital readmission  Interventions:  Evaluation of current treatment plan related to s/p TAVR, self-management and patient's adherence to plan as established by provider. Discussed plans with patient for ongoing care management follow up and provided patient with direct contact information for care management team Evaluation of current treatment plan related to s/p TAVR and patient's adherence to plan as established by provider Provided education to patient re: signs/ symptoms of infection Reviewed medications with patient and discussed importance of taking as prescribed Discussed plans with patient for ongoing care management follow up and provided patient with direct contact information for care management team Advised patient to discuss any change in health status, concerns with overall health/ HF, DM with provider Screening for signs and symptoms of depression related to chronic disease state  Assessed social determinant of health barriers Pain assessment completed Reviewed safety precautions Reviewed signs /symptoms of infection  Patient Self Care Activities:  Attend all scheduled provider appointments Call pharmacy for medication refills 3-7 days in  advance of running out of medications Call provider office for new concerns or questions  Notify RN Care Manager of TOC call rescheduling needs Participate in Transition of Care Program/Attend TOC scheduled calls Take medications as prescribed   Report any signs of infection to your provider  Plan:  Telephone follow up appointment with care management team member scheduled for:  08/15/24 @ 915 am The patient has been provided with contact information for the care management team and has been advised to call with any health related questions or concerns.         Mliss Creed [facility], BSN RN Care Manager/ Transition of Care [place]/ [facility] [phone]

## 2024-08-08 NOTE — Telephone Encounter (Signed)
 Patient contacted regarding discharge from [facility] on 08/07/24  Patient understands to follow up with provider Izetta Hummer, PA-C on 08/15/24 at 1:05PM at [address] Location. Patient understands discharge instructions? Yes Patient understands medications and regiment? Yes Patient understands to bring all medications to this visit? Yes

## 2024-08-09 ENCOUNTER — Other Ambulatory Visit

## 2024-08-12 ENCOUNTER — Ambulatory Visit: Admitting: Physician Assistant

## 2024-08-14 NOTE — Progress Notes (Unsigned)
 [facility]   MULTIDISCIPLINARY HEART VALVE CLINIC                                     Cardiology Office Note:    Date:  08/15/2024   ID:  JORMA TASSINARI, DOB 1946-05-13, MRN [id number]  PCP:  Duanne Butler DASEN, MD  [facility] Cardiologist:  Darryle DASEN Decent, MD  [facility] Structural heart: Lurena MARLA Red, MD [facility] Electrophysiologist:  OLE DASEN HOLTS, MD   Referring MD: Duanne Butler DASEN, MD   [facility] s/p TAVR   History of Present Illness:    Cameron Daniel is a 78 y.o. 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   He had an NSTEMI in 03/2019 and was found to have three vessel CAD. He underwent 3V CABG (LIMA to LAD, SVG to OM, SVG to RCA). Echo in 2020 with LVEF 25-30%. He was admitted with VT in 2021 with stable CAD on cath at that time and had a VT ablation and ICD placed. Echo in 2023 with LVEF 40-45%. Cardiac cath July 2024 following an abnormal Cardiac PET/CT with 3 patent grafts. Echo in October 2024 with LVEF 35-40% and moderate aortic stenosis. He was seen in our office 04/30/24 by Lum Louis, NP and had no complaints with a pending parathyroid  surgery for a nodule c/w parathyroid  adenoma. Echo 05/2024 with LVEF 25-30% with global hypokinesis, severe MAC with mild MR and severe LFLG aortic stenosis with mean gradient 13 mmHg, AVA 0.84 cm2, SVI 24, DI 0.24. His parathyroid  surgery has been delayed with his valve issue. Cath 07/19/24 with three vessel CAD with 3/3 patent grafts. S/p TAVR 34 mm Medtronic FX+ THV via the TF approach on 08/07/24. Post operative echo showed EF 40-45%, G3DD, mod MAC, normally functioning TAVR with a mean gradient of 6 mmHg and no PVL.  Today the patient presents to clinic for follow up. Has had some  swelling in his left scrotum. Hasn't had a good appetite. Weight is down by home scales. Normal is around 178 and now 169. He can breath better since TAVR and has a bit more energy. Still not sleeping well. Can lay there for hours starting at wall. No CP. No LE edema, orthopnea or PND. No dizziness or syncope. No blood in stool or urine. No palpitations.      Past Medical History:  Diagnosis Date   Anxiety    Arthritis    Cataract    Chronic combined systolic (congestive) and diastolic (congestive) heart failure (HCC)    Coronary atherosclerosis of unspecified type of vessel, native or graft    a. MI 1985 with unclear details. b. CABG 03/2019 with LIMA -> LAD, SVG -> OM, SVG to dRCA.   Depression    PTSD    Diverticulosis of colon (without mention of hemorrhage)    Esophageal reflux    Esophageal stricture    Essential hypertension    Former tobacco use    Hemorrhoids    Hyperlipidemia    ICD (implantable cardioverter-defibrillator) in place    Irritable bowel syndrome    Ischemic cardiomyopathy    Mild carotid artery disease    a. 1-39% bilaterally 03/2019 duplex.  Nocturia    Obesity, unspecified    Orthostasis    Personal history of colonic polyps    PTSD (post-traumatic stress disorder)    Retroperitoneal bleed    Type II or unspecified type diabetes mellitus without mention of complication, not stated as uncontrolled    VT (ventricular tachycardia) (HCC)      Current Medications: Active Medications[1]    ROS:   Please see the history of present illness.    All other systems reviewed and are negative.  EKGs   EKG Interpretation Date/Time:  Thursday August 15 2024 13:02:44 EST Ventricular Rate:  77 PR Interval:  128 QRS Duration:  190 QT Interval:  472 QTC Calculation: 534 R Axis:   224  Text Interpretation: Atrial-sensed ventricular-paced rhythm When compared with ECG of 07-Aug-2024 02:33, Vent. rate has increased BY  14 BPM Confirmed by Sebastian Collar  [phone]) on 08/15/2024 1:31:18 PM   Risk Assessment/Calculations:           Physical Exam:    VS:  BP 110/64   Pulse 77   Ht 5' 10 (1.778 m)   Wt 175 lb 6.4 oz (79.6 kg)   SpO2 98%   BMI 25.17 kg/m     Wt Readings from Last 3 Encounters:  08/15/24 175 lb 6.4 oz (79.6 kg)  08/15/24 169 lb (76.7 kg)  08/08/24 177 lb (80.3 kg)     GEN: Well nourished, well developed in no acute distress NECK: No JVD CARDIAC: RRR, no murmurs, rubs, gallops RESPIRATORY:  Clear to auscultation without rales, wheezing or rhonchi  ABDOMEN: Soft, non-tender, non-distended EXTREMITIES:  No edema; No deformity.  Groin sites clear without hematoma. Ecchymosis noted in left groin and into left testicle.   ASSESSMENT:    1. S/P TAVR (transcatheter aortic valve replacement)   2. HYPERTENSION   3. Ischemic cardiomyopathy   4. S/P ICD (internal cardiac defibrillator) procedure   5. Hyperlipidemia, unspecified hyperlipidemia type   6. A-V fistula   7. Parathyroid  adenoma     PLAN:    In order of problems listed above:  Severe AS s/p TAVR:  -- Pt doing well s/p TAVR.  -- ECG with paced rhythm.  -- Groin sites healing well.  -- SBE prophylaxis discussed; the patient is edentulous and does not go to the dentist.  -- Continue Aspirin  81mg  daily. -- Cleared to resume all activities without restriction. -- I will see back for 1 month echo and OV.  HTN: -- BP 110/64 -- Treated in the context of CHF GDMT.    HFrEF/ischemic CM: -- EF improved to 40-45% after TAVR. -- S/p ICD.  -- Continue Jardiance  25mg  daily, Toprol  XL 12.5mg  daily, Spiro 12.5mg  daily.   Ventricular tachycardia s/p ICD: -- Continue follow up with EP.  -- Continue Toprol  XL 12.5mg  daily.   HLD: -- LDL 35 on 11/2023. -- Continue rosuvastatin  40 mg daily.  Right lower extremity AV fistula: -- Noted on pre TAVR CTs.  -- Discussed with pt and Dr. Verlin who did not feel like further work up indicated.   Parathyroid   adenoma: -- Will require parathyroidectomy. -- He will be cleared from a cardiac standpoint at office visit with Dr. Barbaraann on 09/17/24.   Medication Adjustments/Labs and Tests Ordered: Current medicines are reviewed at length with the patient today.  Concerns regarding medicines are outlined above.  Orders Placed This Encounter  Procedures   EKG 12-Lead   ECHOCARDIOGRAM COMPLETE   No orders of the defined types were  placed in this encounter.   Patient Instructions  Medication Instructions:  Your physician recommends that you continue on your current medications as directed. Please refer to the Current Medication list given to you today.  *If you need a refill on your cardiac medications before your next appointment, please call your pharmacy*  Lab Work: None needed If you have labs (blood work) drawn today and your tests are completely normal, you will receive your results only by: MyChart Message (if you have MyChart) OR A paper copy in the mail If you have any lab test that is abnormal or we need to change your treatment, we will call you to review the results.  Testing/Procedures: 09/17/24 Your physician has requested that you have an echocardiogram. Echocardiography is a painless test that uses sound waves to create images of your heart. It provides your doctor with information about the size and shape of your heart and how well your hearts chambers and valves are working. This procedure takes approximately one hour. There are no restrictions for this procedure. Please do NOT wear cologne, perfume, aftershave, or lotions (deodorant is allowed). Please arrive 15 minutes prior to your appointment time.  Please note: We ask at that you not bring children with you during ultrasound (echo/ vascular) testing. Due to room size and safety concerns, children are not allowed in the ultrasound rooms during exams. Our front office staff cannot provide observation of children in our lobby area  while testing is being conducted. An adult accompanying a patient to their appointment will only be allowed in the ultrasound room at the discretion of the ultrasound technician under special circumstances. We apologize for any inconvenience.   Follow-Up: At [facility], you and your health needs are our priority.  As part of our continuing mission to provide you with exceptional heart care, our providers are all part of one team.  This team includes your primary Cardiologist (physician) and Advanced Practice Providers or APPs (Physician Assistants and Nurse Practitioners) who all work together to provide you with the care you need, when you need it.  Your next appointment:   As scheduled on 09/18/23  Provider:   Izetta Hummer, PA-C  We recommend signing up for the patient portal called MyChart.  Sign up information is provided on this After Visit Summary.  MyChart is used to connect with patients for Virtual Visits (Telemedicine).  Patients are able to view lab/test results, encounter notes, upcoming appointments, etc.  Non-urgent messages can be sent to your provider as well.   To learn more about what you can do with MyChart, go to [email or web address].          Signed, Lamarr Hummer, PA-C  08/15/2024 2:13 PM    [facility]     [1]  Current Meds  Medication Sig   acetaminophen  (TYLENOL ) 500 MG tablet Take 1,000 mg by mouth every 6 (six) hours as needed for mild pain (pain score 1-3).   aspirin  EC 81 MG tablet Take 1 tablet (81 mg total) by mouth daily.   citalopram  (CELEXA ) 10 MG tablet Take 1 tablet (10 mg total) by mouth daily.   docusate sodium  (COLACE) 100 MG capsule Take 100-200 mg by mouth 2 (two) times daily as needed (constipation).   empagliflozin  (JARDIANCE ) 25 MG TABS tablet TAKE 25 MG (1 TABLET) BY MOUTH DAILY BEFORE BREAKFAST. STOP PIOGLITAZONE    finasteride  (PROSCAR ) 5 MG tablet TAKE 1 TABLET BY MOUTH EVERY DAY   metoprolol   succinate (TOPROL   XL) 25 MG 24 hr tablet Take 0.5 tablets (12.5 mg total) by mouth daily.   montelukast  (SINGULAIR ) 10 MG tablet Take 1 tablet (10 mg total) by mouth at bedtime.   nitroGLYCERIN  (NITROSTAT ) 0.4 MG SL tablet Place 1 tablet (0.4 mg total) under the tongue every 5 (five) minutes x 3 doses as needed for chest pain.   olopatadine  (PATADAY ) 0.1 % ophthalmic solution Place 1 drop into both eyes 2 (two) times daily as needed for allergies.   pantoprazole  (PROTONIX ) 40 MG tablet TAKE 1 TABLET BY MOUTH TWICE A DAY   rosuvastatin  (CRESTOR ) 40 MG tablet Take 1 tablet (40 mg total) by mouth daily.   sitaGLIPtin  (JANUVIA ) 100 MG tablet Take 100 mg by mouth in the morning.   spironolactone  (ALDACTONE ) 25 MG tablet TAKE 1/2 TABLET BY MOUTH EVERY DAY

## 2024-08-15 ENCOUNTER — Other Ambulatory Visit: Admitting: *Deleted

## 2024-08-15 ENCOUNTER — Ambulatory Visit: Attending: Cardiovascular Disease | Admitting: Physician Assistant

## 2024-08-15 VITALS — BP 110/64 | HR 77 | Ht 70.0 in | Wt 175.4 lb

## 2024-08-15 DIAGNOSIS — I770 Arteriovenous fistula, acquired: Secondary | ICD-10-CM

## 2024-08-15 DIAGNOSIS — E785 Hyperlipidemia, unspecified: Secondary | ICD-10-CM

## 2024-08-15 DIAGNOSIS — Z95810 Presence of automatic (implantable) cardiac defibrillator: Secondary | ICD-10-CM | POA: Diagnosis not present

## 2024-08-15 DIAGNOSIS — I10 Essential (primary) hypertension: Secondary | ICD-10-CM | POA: Diagnosis not present

## 2024-08-15 DIAGNOSIS — Z952 Presence of prosthetic heart valve: Secondary | ICD-10-CM | POA: Diagnosis not present

## 2024-08-15 DIAGNOSIS — D351 Benign neoplasm of parathyroid gland: Secondary | ICD-10-CM | POA: Diagnosis not present

## 2024-08-15 DIAGNOSIS — I255 Ischemic cardiomyopathy: Secondary | ICD-10-CM

## 2024-08-15 NOTE — Patient Instructions (Signed)
 Visit Information  Thank you for taking time to visit with me today. Please don't hesitate to contact me if I can be of assistance to you before our next scheduled telephone appointment.  Our next appointment is by telephone on 08/28/24 @ 115 pm  Following is a copy of your care plan:   Goals Addressed             This Visit's Progress    VBCI Transitions of Care (TOC) Care Plan       Problems:  Recent Hospitalization for treatment of s/p TAVR 08/15/24- spoke with pt who reports he continues weighing daily, checking CBG daily, pt to follow up with cardiologist today  Goal:  Over the next 30 days, the patient will not experience hospital readmission  Interventions:  Evaluation of current treatment plan related to s/p TAVR, self-management and patient's adherence to plan as established by provider. Discussed plans with patient for ongoing care management follow up and provided patient with direct contact information for care management team Evaluation of current treatment plan related to s/p TAVR and patient's adherence to plan as established by provider Provided education to patient re: signs/ symptoms of infection Reviewed medications with patient and discussed importance of taking as prescribed Discussed plans with patient for ongoing care management follow up and provided patient with direct contact information for care management team Advised patient to discuss any change in health status, concerns with overall health/ HF, DM with provider Pain assessment completed Reinforced safety precautions Reinforced signs /symptoms of infection  Patient Self Care Activities:  Attend all scheduled provider appointments Call pharmacy for medication refills 3-7 days in advance of running out of medications Call provider office for new concerns or questions  Notify RN Care Manager of TOC call rescheduling needs Participate in Transition of Care Program/Attend TOC scheduled calls Take  medications as prescribed   Report any signs of infection to your provider  Plan:  Telephone follow up appointment with care management team member scheduled for:  08/28/24 @ 115 pm The patient has been provided with contact information for the care management team and has been advised to call with any health related questions or concerns.         Care plan and visit instructions communicated with the patient verbally today. The patient DECLINED to receive copy of care plan and patient instructions in any format.   Telephone follow up appointment with care management team member scheduled for: 08/28/24 @ 115 pm  Please call the care guide team at [phone] if you need to cancel or reschedule your appointment.   Please call the Suicide and Crisis Lifeline: 988 call the [place]  National Suicide Prevention Lifeline: 7138361923 or TTY: 810 153 7555 TTY 838-609-8463) to talk to a trained counselor call 1-800-273-TALK (toll free, 24 hour hotline) go to [facility] [address] [place] [phone]) call 911 if you are experiencing a Mental Health or Behavioral Health Crisis or need someone to talk to.  Mliss Creed [facility], BSN RN Care Manager/ Transition of Care [place]/ [facility] [phone]

## 2024-08-15 NOTE — Patient Outreach (Addendum)
 Transition of Care week 3  Visit Note  08/15/2024  Name: Cameron Daniel MRN: [id number]          DOB: [birth year]  Situation: Patient enrolled in [facility] 30-day program. Visit completed with patient by telephone.   Background:  Discharge Date and Diagnosis: 08/07/24, S/P TAVR (transcatheter aortic valve replacement)   Past Medical History:  Diagnosis Date   Anxiety    Arthritis    Cataract    Chronic combined systolic (congestive) and diastolic (congestive) heart failure (HCC)    Coronary atherosclerosis of unspecified type of vessel, native or graft    a. MI 1985 with unclear details. b. CABG 03/2019 with LIMA -> LAD, SVG -> OM, SVG to dRCA.   Depression    PTSD    Diverticulosis of colon (without mention of hemorrhage)    Esophageal reflux    Esophageal stricture    Essential hypertension    Former tobacco use    Hemorrhoids    Hyperlipidemia    ICD (implantable cardioverter-defibrillator) in place    Irritable bowel syndrome    Ischemic cardiomyopathy    Mild carotid artery disease    a. 1-39% bilaterally 03/2019 duplex.   Nocturia    Obesity, unspecified    Orthostasis    Personal history of colonic polyps    PTSD (post-traumatic stress disorder)    Retroperitoneal bleed    Type II or unspecified type diabetes mellitus without mention of complication, not stated as uncontrolled    VT (ventricular tachycardia) (HCC)     Assessment: Patient Reported Symptoms: Cognitive Cognitive Status: No symptoms reported, Alert and oriented to person, place, and time, Able to follow simple commands, Normal speech and language skills      Neurological Neurological Review of Symptoms: No symptoms reported    HEENT HEENT Symptoms Reported: No symptoms reported      Cardiovascular Cardiovascular Symptoms Reported: No symptoms reported Does patient have uncontrolled Hypertension?: No Cardiovascular Management Strategies: Adequate rest Weight: 169 lb (76.7 kg) Cardiovascular  Self-Management Outcome: 4 (good)  Respiratory Respiratory Symptoms Reported: No symptoms reported    Endocrine Endocrine Symptoms Reported: No symptoms reported Is patient diabetic?: Yes Is patient checking blood sugars at home?: Yes List most recent blood sugar readings, include date and time of day: FBS 108-118 range Endocrine Self-Management Outcome: 4 (good)  Gastrointestinal Gastrointestinal Symptoms Reported: No symptoms reported      Genitourinary Genitourinary Symptoms Reported: No symptoms reported    Integumentary Integumentary Symptoms Reported: Incision Additional Integumentary Details: pt reports incision to left groin is WNL Skin Management Strategies: Adequate rest, Routine screening Skin Self-Management Outcome: 4 (good) Skin Comment: reinforced signs/ symptoms of infection  Musculoskeletal Musculoskelatal Symptoms Reviewed: Limited mobility Additional Musculoskeletal Details: uses walker Musculoskeletal Management Strategies: Adequate rest Musculoskeletal Self-Management Outcome: 4 (good) Musculoskeletal Comment: reviewed safety precautions      Psychosocial Psychosocial Symptoms Reported: No symptoms reported         Today's Vitals   08/15/24 0928  Weight: 169 lb (76.7 kg)   Pain Scale: 0-10 Pain Score: 0-No pain  Medications Reviewed Today     Reviewed by Aura Mliss LABOR, RN (Registered Nurse) on 08/15/24 at [phone]  Med List Status: <None>   Medication Order Taking? Sig Documenting Provider Last Dose Status Informant  acetaminophen  (TYLENOL ) 500 MG tablet [id number]  Take 1,000 mg by mouth every 6 (six) hours as needed for mild pain (pain score 1-3). [provider]  Active Self  aspirin  EC 81 MG tablet  [id number]  Take 1 tablet (81 mg total) by mouth daily. Dunn, Dayna N, PA-C  Active Self  citalopram  (CELEXA ) 10 MG tablet [id number]  Take 1 tablet (10 mg total) by mouth daily. Duanne Butler DASEN, MD  Active Self  docusate sodium  (COLACE) 100 MG  capsule [id number]  Take 100-200 mg by mouth 2 (two) times daily as needed (constipation). [provider]  Active Self  empagliflozin  (JARDIANCE ) 25 MG TABS tablet [id number]  TAKE 25 MG (1 TABLET) BY MOUTH DAILY BEFORE BREAKFAST. STOP PIOGLITAZONE  Lucien Orren SAILOR, PA-C  Active Self  finasteride  (PROSCAR ) 5 MG tablet [id number]  TAKE 1 TABLET BY MOUTH EVERY DAY Duanne Butler DASEN, MD  Active Self  metoprolol  succinate (TOPROL  XL) 25 MG 24 hr tablet [id number]  Take 0.5 tablets (12.5 mg total) by mouth daily. Duanne Butler DASEN, MD  Active Self  montelukast  (SINGULAIR ) 10 MG tablet [id number]  Take 1 tablet (10 mg total) by mouth at bedtime. Duanne Butler DASEN, MD  Active Self  nitroGLYCERIN  (NITROSTAT ) 0.4 MG SL tablet [id number]  Place 1 tablet (0.4 mg total) under the tongue every 5 (five) minutes x 3 doses as needed for chest pain. Lelon Hamilton T, PA-C  Active Self  olopatadine  (PATADAY ) 0.1 % ophthalmic solution [id number]  Place 1 drop into both eyes 2 (two) times daily as needed for allergies. [provider]  Active Self  pantoprazole  (PROTONIX ) 40 MG tablet [id number]  TAKE 1 TABLET BY MOUTH TWICE A DAY Duanne Butler DASEN, MD  Active Self  rosuvastatin  (CRESTOR ) 40 MG tablet [id number]  Take 1 tablet (40 mg total) by mouth daily. Duanne Butler DASEN, MD  Active Self  sitaGLIPtin  (JANUVIA ) 100 MG tablet [id number]  Take 100 mg by mouth in the morning. [provider]  Active Self  spironolactone  (ALDACTONE ) 25 MG tablet [id number]  TAKE 1/2 TABLET BY MOUTH EVERY DAY O'Neal, Darryle Ned, MD  Active Self            Goals Addressed             This Visit's Progress    VBCI Transitions of Care (TOC) Care Plan       Problems:  Recent Hospitalization for treatment of s/p TAVR 08/15/24- spoke with pt who reports he continues weighing daily, checking CBG daily, pt to follow up with cardiologist today  Goal:  Over the next 30 days, the patient will not experience hospital  readmission  Interventions:  Evaluation of current treatment plan related to s/p TAVR, self-management and patient's adherence to plan as established by provider. Discussed plans with patient for ongoing care management follow up and provided patient with direct contact information for care management team Evaluation of current treatment plan related to s/p TAVR and patient's adherence to plan as established by provider Provided education to patient re: signs/ symptoms of infection Reviewed medications with patient and discussed importance of taking as prescribed Discussed plans with patient for ongoing care management follow up and provided patient with direct contact information for care management team Advised patient to discuss any change in health status, concerns with overall health/ HF, DM with provider Pain assessment completed Reinforced safety precautions Reinforced signs /symptoms of infection  Patient Self Care Activities:  Attend all scheduled provider appointments Call pharmacy for medication refills 3-7 days in advance of running out of medications Call provider office for new concerns or questions  Notify RN Care Manager of TOC call rescheduling needs Participate in Transition of Care Program/Attend TOC scheduled  calls Take medications as prescribed   Report any signs of infection to your provider  Plan:  Telephone follow up appointment with care management team member scheduled for:  08/28/24 @ 115 pm The patient has been provided with contact information for the care management team and has been advised to call with any health related questions or concerns.         Recommendation:   PCP Follow-up  Follow Up Plan:   Telephone follow-up 08/28/24 @ 115 pm, will not outreach pt the week of Christmas, pt agreeable with plan, will outreach week of 08/26/24  Mliss Creed [facility], BSN RN Care Manager/ Transition of Care [place]/ [facility] [phone]

## 2024-08-15 NOTE — Patient Instructions (Signed)
 Medication Instructions:  Your physician recommends that you continue on your current medications as directed. Please refer to the Current Medication list given to you today.  *If you need a refill on your cardiac medications before your next appointment, please call your pharmacy*  Lab Work: None needed If you have labs (blood work) drawn today and your tests are completely normal, you will receive your results only by: MyChart Message (if you have MyChart) OR A paper copy in the mail If you have any lab test that is abnormal or we need to change your treatment, we will call you to review the results.  Testing/Procedures: 09/17/24 Your physician has requested that you have an echocardiogram. Echocardiography is a painless test that uses sound waves to create images of your heart. It provides your doctor with information about the size and shape of your heart and how well your hearts chambers and valves are working. This procedure takes approximately one hour. There are no restrictions for this procedure. Please do NOT wear cologne, perfume, aftershave, or lotions (deodorant is allowed). Please arrive 15 minutes prior to your appointment time.  Please note: We ask at that you not bring children with you during ultrasound (echo/ vascular) testing. Due to room size and safety concerns, children are not allowed in the ultrasound rooms during exams. Our front office staff cannot provide observation of children in our lobby area while testing is being conducted. An adult accompanying a patient to their appointment will only be allowed in the ultrasound room at the discretion of the ultrasound technician under special circumstances. We apologize for any inconvenience.   Follow-Up: At [facility], you and your health needs are our priority.  As part of our continuing mission to provide you with exceptional heart care, our providers are all part of one team.  This team includes your primary  Cardiologist (physician) and Advanced Practice Providers or APPs (Physician Assistants and Nurse Practitioners) who all work together to provide you with the care you need, when you need it.  Your next appointment:   As scheduled on 09/18/23  Provider:   Izetta Hummer, PA-C  We recommend signing up for the patient portal called MyChart.  Sign up information is provided on this After Visit Summary.  MyChart is used to connect with patients for Virtual Visits (Telemedicine).  Patients are able to view lab/test results, encounter notes, upcoming appointments, etc.  Non-urgent messages can be sent to your provider as well.   To learn more about what you can do with MyChart, go to [email or web address].

## 2024-08-19 ENCOUNTER — Ambulatory Visit: Admitting: "Endocrinology

## 2024-08-19 DIAGNOSIS — E119 Type 2 diabetes mellitus without complications: Secondary | ICD-10-CM | POA: Diagnosis not present

## 2024-08-28 ENCOUNTER — Encounter: Payer: Self-pay | Admitting: *Deleted

## 2024-08-28 ENCOUNTER — Telehealth: Payer: Self-pay | Admitting: *Deleted

## 2024-08-30 ENCOUNTER — Telehealth: Payer: Self-pay | Admitting: Internal Medicine

## 2024-08-30 ENCOUNTER — Telehealth: Payer: Self-pay | Admitting: *Deleted

## 2024-08-30 NOTE — Patient Instructions (Signed)
 Visit Information  Thank you for taking time to visit with me today. Please don't hesitate to contact me if I can be of assistance to you before our next scheduled telephone appointment.  Following are the goals we discussed today:   Goals Addressed             This Visit's Progress    VBCI Transitions of Care (TOC) Care Plan       Problems:  Recent Hospitalization for treatment of s/p TAVR 08/30/24-- spoke with pt who reports he continues weighing daily, checking CBG daily, no new concerns reported  Goal:  Over the next 30 days, the patient will not experience hospital readmission  Interventions:  Evaluation of current treatment plan related to s/p TAVR, self-management and patient's adherence to plan as established by provider. Discussed plans with patient for ongoing care management follow up and provided patient with direct contact information for care management team Evaluation of current treatment plan related to s/p TAVR and patient's adherence to plan as established by provider Provided education to patient re: signs/ symptoms of infection Reviewed medications with patient and discussed importance of taking as prescribed Discussed plans with patient for ongoing care management follow up and provided patient with direct contact information for care management team Advised patient to discuss any change in health status, concerns with overall health/ HF, DM with provider Pain assessment completed Reviewed safety precautions Reviewed signs /symptoms of infection  Patient Self Care Activities:  Attend all scheduled provider appointments Call pharmacy for medication refills 3-7 days in advance of running out of medications Call provider office for new concerns or questions  Notify RN Care Manager of TOC call rescheduling needs Participate in Transition of Care Program/Attend TOC scheduled calls Take medications as prescribed   Report any signs of infection to your  provider  Plan:  Telephone follow up appointment with care management team member scheduled for:  09/05/24 @ 1030 am The patient has been provided with contact information for the care management team and has been advised to call with any health related questions or concerns.          Our next appointment is by telephone on 09/05/24 @ 1030 am  Please call the care guide team at [phone] if you need to cancel or reschedule your appointment.   If you are experiencing a Mental Health or Behavioral Health Crisis or need someone to talk to, please call the Suicide and Crisis Lifeline: 988 call the [place]  National Suicide Prevention Lifeline: 647 485 0918 or TTY: 916 507 3729 TTY 670-467-3127) to talk to a trained counselor call 1-800-273-TALK (toll free, 24 hour hotline) call 911   Care plan and visit instructions communicated with the patient verbally today. The patient DECLINED to receive copy of care plan and patient instructions in any format.   Telephone follow up appointment with care management team member scheduled for: 09/05/24 @ 1030 am  Mliss Creed [facility], BSN RN Care Manager/ Transition of Care [place]/ [facility] [phone]

## 2024-08-30 NOTE — Telephone Encounter (Signed)
 Pt would like to know if it is okay to get a steroid shot in his knee. Please advise.

## 2024-08-30 NOTE — Patient Outreach (Signed)
 " Transition of Care week 3  Visit Note  08/30/2024  Name: Cameron Daniel MRN: [id number]          DOB: 04/11/1946  Situation: Patient enrolled in [facility] 30-day program. Visit completed with patient by telephone.   Background:   Initial Transition Care Management Follow-up Telephone Call Discharge Date and Diagnosis: 08/07/24, S/P TAVR (transcatheter aortic valve replacement)   Past Medical History:  Diagnosis Date   Anxiety    Arthritis    Cataract    Chronic combined systolic (congestive) and diastolic (congestive) heart failure (HCC)    Coronary atherosclerosis of unspecified type of vessel, native or graft    a. MI 1985 with unclear details. b. CABG 03/2019 with LIMA -> LAD, SVG -> OM, SVG to dRCA.   Depression    PTSD    Diverticulosis of colon (without mention of hemorrhage)    Esophageal reflux    Esophageal stricture    Essential hypertension    Former tobacco use    Hemorrhoids    Hyperlipidemia    ICD (implantable cardioverter-defibrillator) in place    Irritable bowel syndrome    Ischemic cardiomyopathy    Mild carotid artery disease    a. 1-39% bilaterally 03/2019 duplex.   Nocturia    Obesity, unspecified    Orthostasis    Personal history of colonic polyps    PTSD (post-traumatic stress disorder)    Retroperitoneal bleed    Type II or unspecified type diabetes mellitus without mention of complication, not stated as uncontrolled    VT (ventricular tachycardia) (HCC)     Assessment: Patient Reported Symptoms: Cognitive Cognitive Status: No symptoms reported, Alert and oriented to person, place, and time, Able to follow simple commands, Normal speech and language skills      Neurological Neurological Review of Symptoms: No symptoms reported    HEENT HEENT Symptoms Reported: No symptoms reported      Cardiovascular Cardiovascular Symptoms Reported: No symptoms reported Does patient have uncontrolled Hypertension?: No Weight: 170 lb (77.1  kg) Cardiovascular Comment: s/p TAVR- no concerns reported  Respiratory Respiratory Symptoms Reported: No symptoms reported    Endocrine Endocrine Symptoms Reported: No symptoms reported Is patient diabetic?: Yes Is patient checking blood sugars at home?: Yes List most recent blood sugar readings, include date and time of day: FBS range 98-120 Endocrine Self-Management Outcome: 4 (good) Endocrine Comment: reinforced importance of good CBG control for healing  Gastrointestinal Gastrointestinal Symptoms Reported: No symptoms reported      Genitourinary Genitourinary Symptoms Reported: No symptoms reported    Integumentary Integumentary Symptoms Reported: Incision Additional Integumentary Details: pt reports incision to left groin is looking good, swelling has gone down Skin Management Strategies: Adequate rest Skin Self-Management Outcome: 4 (good) Skin Comment: reviewed signs/ symptoms of infection  Musculoskeletal Musculoskelatal Symptoms Reviewed: Limited mobility Additional Musculoskeletal Details: uses walker Musculoskeletal Management Strategies: Adequate rest Musculoskeletal Self-Management Outcome: 4 (good) Musculoskeletal Comment: reinforced safety precautions      Psychosocial Psychosocial Symptoms Reported: No symptoms reported         Today's Vitals   08/30/24 1147  Weight: 170 lb (77.1 kg)   Pain Scale: 0-10 Pain Score: 0-No pain  Medications Reviewed Today     Reviewed by Aura Mliss LABOR, RN (Registered Nurse) on 08/30/24 at 1144  Med List Status: <None>   Medication Order Taking? Sig Documenting Provider Last Dose Status Informant  acetaminophen  (TYLENOL ) 500 MG tablet [id number]  Take 1,000 mg by mouth every 6 (six) hours as  needed for mild pain (pain score 1-3). [provider]  Active Self  aspirin  EC 81 MG tablet [id number]  Take 1 tablet (81 mg total) by mouth daily. Dunn, Dayna N, PA-C  Active Self  citalopram  (CELEXA ) 10 MG tablet [id number]   Take 1 tablet (10 mg total) by mouth daily. Duanne Butler DASEN, MD  Active Self  docusate sodium  (COLACE) 100 MG capsule [id number]  Take 100-200 mg by mouth 2 (two) times daily as needed (constipation). [provider]  Active Self  empagliflozin  (JARDIANCE ) 25 MG TABS tablet [id number]  TAKE 25 MG (1 TABLET) BY MOUTH DAILY BEFORE BREAKFAST. STOP PIOGLITAZONE  Lucien Orren SAILOR, PA-C  Active Self  finasteride  (PROSCAR ) 5 MG tablet [id number]  TAKE 1 TABLET BY MOUTH EVERY DAY Duanne Butler DASEN, MD  Active Self  metoprolol  succinate (TOPROL  XL) 25 MG 24 hr tablet [id number]  Take 0.5 tablets (12.5 mg total) by mouth daily. Duanne Butler DASEN, MD  Active Self  montelukast  (SINGULAIR ) 10 MG tablet [id number]  Take 1 tablet (10 mg total) by mouth at bedtime. Duanne Butler DASEN, MD  Active Self  nitroGLYCERIN  (NITROSTAT ) 0.4 MG SL tablet [id number]  Place 1 tablet (0.4 mg total) under the tongue every 5 (five) minutes x 3 doses as needed for chest pain. Lelon Hamilton T, PA-C  Active Self  olopatadine  (PATADAY ) 0.1 % ophthalmic solution [id number]  Place 1 drop into both eyes 2 (two) times daily as needed for allergies. [provider]  Active Self  pantoprazole  (PROTONIX ) 40 MG tablet [id number]  TAKE 1 TABLET BY MOUTH TWICE A DAY Duanne Butler DASEN, MD  Active Self  rosuvastatin  (CRESTOR ) 40 MG tablet [id number]  Take 1 tablet (40 mg total) by mouth daily. Duanne Butler DASEN, MD  Active Self  sitaGLIPtin  (JANUVIA ) 100 MG tablet [id number]  Take 100 mg by mouth in the morning. [provider]  Active Self  spironolactone  (ALDACTONE ) 25 MG tablet [id number]  TAKE 1/2 TABLET BY MOUTH EVERY DAY O'Neal, Darryle Ned, MD  Active Self            Goals Addressed             This Visit's Progress    VBCI Transitions of Care (TOC) Care Plan       Problems:  Recent Hospitalization for treatment of s/p TAVR 08/30/24-- spoke with pt who reports he continues weighing daily, checking CBG daily, no new  concerns reported  Goal:  Over the next 30 days, the patient will not experience hospital readmission  Interventions:  Evaluation of current treatment plan related to s/p TAVR, self-management and patient's adherence to plan as established by provider. Discussed plans with patient for ongoing care management follow up and provided patient with direct contact information for care management team Evaluation of current treatment plan related to s/p TAVR and patient's adherence to plan as established by provider Provided education to patient re: signs/ symptoms of infection Reviewed medications with patient and discussed importance of taking as prescribed Discussed plans with patient for ongoing care management follow up and provided patient with direct contact information for care management team Advised patient to discuss any change in health status, concerns with overall health/ HF, DM with provider Pain assessment completed Reviewed safety precautions Reviewed signs /symptoms of infection  Patient Self Care Activities:  Attend all scheduled provider appointments Call pharmacy for medication refills 3-7 days in advance of running out of medications Call provider office for new concerns or questions  Notify  RN Care Manager of TOC call rescheduling needs Participate in Transition of Care Program/Attend TOC scheduled calls Take medications as prescribed   Report any signs of infection to your provider  Plan:  Telephone follow up appointment with care management team member scheduled for:  09/05/24 @ 1030 am The patient has been provided with contact information for the care management team and has been advised to call with any health related questions or concerns.         Recommendation:   PCP Follow-up  Follow Up Plan:   Telephone follow-up 09/05/24 @ 1030 am  Mliss Creed [facility], BSN RN Care Manager/ Transition of Care [place]/ [facility] [phone]     "

## 2024-08-31 ENCOUNTER — Other Ambulatory Visit: Payer: Self-pay | Admitting: Family Medicine

## 2024-09-03 LAB — OPHTHALMOLOGY REPORT-SCANNED

## 2024-09-03 NOTE — Telephone Encounter (Signed)
"  Left message to call back.   "

## 2024-09-04 NOTE — Telephone Encounter (Signed)
 Spoke with pt and let him know it is okay to proceed with getting a steroid shot in his knee. Pt was very appreciative for the call back.

## 2024-09-05 ENCOUNTER — Other Ambulatory Visit: Payer: Self-pay | Admitting: *Deleted

## 2024-09-05 NOTE — Patient Instructions (Signed)
 Visit Information  Thank you for taking time to visit with me today. Please don't hesitate to contact me if I can be of assistance to you before our next scheduled telephone appointment.   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 Discussed plans with patient for ongoing care management follow up and provided patient with direct contact information for care management team Evaluation of current treatment plan related to s/p TAVR and patient's adherence to plan as established by provider Reviewed medications with patient and discussed importance of taking as prescribed Advised patient to discuss any change in health status, concerns with overall health/ HF, DM with provider Pain assessment completed Reinforced safety precautions Reinforced signs /symptoms of infection Reviewed plan of care including case closure  Patient Self Care Activities:  Attend all scheduled provider appointments Call pharmacy for medication refills 3-7 days in advance of running out of medications Call provider office for new concerns or questions  Notify RN Care Manager of TOC call rescheduling needs Participate in Transition of Care Program/Attend TOC scheduled calls Take medications as prescribed   Report any signs of infection to your provider  Plan:  Case closure        Care plan and visit instructions  communicated with the patient verbally today. The patient DECLINED to receive copy of care plan and patient instructions in any format.   No further follow up required: case closure  Please call the care guide team at [phone] if you need to cancel or reschedule your appointment.   Please call the Suicide and Crisis Lifeline: 988 call the [place]  National Suicide Prevention Lifeline: 515-655-6911 or TTY: 810-787-6794 TTY 810-607-0266) to talk to a trained counselor call 1-800-273-TALK (toll free, 24 hour hotline) go to [facility] [address], [place] [phone]) call 911 if you are experiencing a Mental Health or Behavioral Health Crisis or need someone to talk to.  Mliss Creed [facility], BSN RN Care Manager/ Transition of Care [place]/ [facility] [phone]

## 2024-09-05 NOTE — Patient Outreach (Signed)
 " Transition of Care week 4  Visit Note  09/05/2024  Name: Cameron Daniel MRN: [id number]          DOB: April 07, 1946  Situation: Patient enrolled in [facility] 30-day program. Visit completed with patient by telephone.   Background:   Initial Transition Care Management Follow-up Telephone Call Discharge Date and Diagnosis: 08/07/24, S/P TAVR (transcatheter aortic valve replacement)   Past Medical History:  Diagnosis Date   Anxiety    Arthritis    Cataract    Chronic combined systolic (congestive) and diastolic (congestive) heart failure (HCC)    Coronary atherosclerosis of unspecified type of vessel, native or graft    a. MI 1985 with unclear details. b. CABG 03/2019 with LIMA -> LAD, SVG -> OM, SVG to dRCA.   Depression    PTSD    Diverticulosis of colon (without mention of hemorrhage)    Esophageal reflux    Esophageal stricture    Essential hypertension    Former tobacco use    Hemorrhoids    Hyperlipidemia    ICD (implantable cardioverter-defibrillator) in place    Irritable bowel syndrome    Ischemic cardiomyopathy    Mild carotid artery disease    a. 1-39% bilaterally 03/2019 duplex.   Nocturia    Obesity, unspecified    Orthostasis    Personal history of colonic polyps    PTSD (post-traumatic stress disorder)    Retroperitoneal bleed    Type II or unspecified type diabetes mellitus without mention of complication, not stated as uncontrolled    VT (ventricular tachycardia) (HCC)     Assessment: Patient Reported Symptoms: Cognitive Cognitive Status: No symptoms reported, Alert and oriented to person, place, and time, Normal speech and language skills      Neurological Neurological Review of Symptoms: No symptoms reported    HEENT HEENT Symptoms Reported: No symptoms reported      Cardiovascular Cardiovascular Symptoms Reported: No symptoms reported Does patient have uncontrolled Hypertension?: No Weight: 170 lb (77.1 kg) Cardiovascular Self-Management Outcome: 4  (good) Cardiovascular Comment: s/p TAVR- no concerns reported  Respiratory Respiratory Symptoms Reported: No symptoms reported    Endocrine Endocrine Symptoms Reported: No symptoms reported Is patient diabetic?: Yes Is patient checking blood sugars at home?: Yes List most recent blood sugar readings, include date and time of day: FBS range is low 100's Endocrine Self-Management Outcome: 4 (good) Endocrine Comment: reviewed carbohydate modified diet  Gastrointestinal Gastrointestinal Symptoms Reported: No symptoms reported      Genitourinary Genitourinary Symptoms Reported: No symptoms reported    Integumentary Integumentary Symptoms Reported: Incision Additional Integumentary Details: left groin incision per pt looks good, slightly tender  denies any signs/ symptoms of infection Skin Management Strategies: Adequate rest, Routine screening Skin Self-Management Outcome: 4 (good) Skin Comment: reinforced signs/ symptoms of infection  Musculoskeletal Musculoskelatal Symptoms Reviewed: Limited mobility Additional Musculoskeletal Details: uses walker due to knee issues- scheduled for cortisone injection beginning of February Musculoskeletal Management Strategies: Adequate rest Musculoskeletal Self-Management Outcome: 4 (good) Musculoskeletal Comment: reviewed safety precautions      Psychosocial Psychosocial Symptoms Reported: No symptoms reported         Today's Vitals   09/05/24 1042  Weight: 170 lb (77.1 kg)   Pain Scale: 0-10 Pain Score: 0-No pain  Medications Reviewed Today     Reviewed by Aura Mliss LABOR, RN (Registered Nurse) on 09/05/24 at 1040  Med List Status: <None>   Medication Order Taking? Sig Documenting Provider Last Dose Status Informant  acetaminophen  (TYLENOL ) 500  MG tablet [id number]  Take 1,000 mg by mouth every 6 (six) hours as needed for mild pain (pain score 1-3). [provider]  Active Self  aspirin  EC 81 MG tablet [id number]  Take 1 tablet  (81 mg total) by mouth daily. Dunn, Dayna N, PA-C  Active Self  citalopram  (CELEXA ) 10 MG tablet [id number]  Take 1 tablet (10 mg total) by mouth daily. Duanne Butler DASEN, MD  Active Self  docusate sodium  (COLACE) 100 MG capsule [id number]  Take 100-200 mg by mouth 2 (two) times daily as needed (constipation). [provider]  Active Self  empagliflozin  (JARDIANCE ) 25 MG TABS tablet [id number]  TAKE 25 MG (1 TABLET) BY MOUTH DAILY BEFORE BREAKFAST. STOP PIOGLITAZONE  Lucien Orren SAILOR, PA-C  Active Self  finasteride  (PROSCAR ) 5 MG tablet [id number]  TAKE 1 TABLET BY MOUTH EVERY DAY Duanne Butler DASEN, MD  Active Self  metoprolol  succinate (TOPROL  XL) 25 MG 24 hr tablet [id number]  Take 0.5 tablets (12.5 mg total) by mouth daily. Duanne Butler DASEN, MD  Active Self  montelukast  (SINGULAIR ) 10 MG tablet [id number]  TAKE 1 TABLET BY MOUTH EVERYDAY AT BEDTIME Duanne Butler DASEN, MD  Active   nitroGLYCERIN  (NITROSTAT ) 0.4 MG SL tablet [id number]  Place 1 tablet (0.4 mg total) under the tongue every 5 (five) minutes x 3 doses as needed for chest pain. Lelon Hamilton T, PA-C  Active Self  olopatadine  (PATADAY ) 0.1 % ophthalmic solution [id number]  Place 1 drop into both eyes 2 (two) times daily as needed for allergies. [provider]  Active Self  pantoprazole  (PROTONIX ) 40 MG tablet [id number]  TAKE 1 TABLET BY MOUTH TWICE A DAY Duanne Butler DASEN, MD  Active Self  rosuvastatin  (CRESTOR ) 40 MG tablet [id number]  Take 1 tablet (40 mg total) by mouth daily. Duanne Butler DASEN, MD  Active Self  sitaGLIPtin  (JANUVIA ) 100 MG tablet [id number]  Take 100 mg by mouth in the morning. 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 Discussed plans with patient for ongoing care management follow up and provided patient with direct contact information for care management team Evaluation of current treatment plan related to s/p TAVR and patient's adherence to plan as established by provider Reviewed medications with patient and discussed importance of taking as prescribed Advised patient to discuss any change in health status, concerns with overall health/ HF, DM with provider Pain assessment completed Reinforced safety precautions Reinforced signs /symptoms of infection Reviewed plan of care including case closure  Patient Self Care Activities:  Attend all scheduled provider appointments Call pharmacy for medication refills 3-7 days in advance of running out of medications Call provider office for new concerns or questions  Notify RN Care Manager of TOC call rescheduling needs Participate in Transition of Care Program/Attend TOC scheduled calls Take medications as prescribed   Report any signs of infection to your provider  Plan:  Case closure         Recommendation:   PCP Follow-up  Follow Up Plan:   Closing From:  Transitions of Care Program  Mliss Creed [facility], BSN RN Care Manager/ Transition of Care [place]/ [facility] [phone]     "

## 2024-09-09 NOTE — Progress Notes (Signed)
 " Cardiology Office Note:  .   Date:  09/17/2024  ID:  Cameron Daniel, DOB March 25, 1946, MRN [id number] PCP: Duanne Butler DASEN, MD  [facility] Providers Cardiologist:  Darryle DASEN Decent, MD Cardiology APP:  Rana Lum CROME, NP  Electrophysiologist:  OLE DASEN HOLTS, MD (Inactive)  Advanced Heart Failure:  Toribio Fuel, MD  Structural Heart:  Arun K Thukkani, MD  History of Present Illness: .    Chief Complaint  Patient presents with   Follow-up    Cameron Daniel is a 79 y.o. male with below history who presents for follow-up.   History of Present Illness   Cameron Daniel is a 79 year old male with coronary artery disease, severe aortic stenosis, and atrial fibrillation who presents for follow-up.  He underwent a transcatheter aortic valve replacement (TAVR) on August 06, 2024, and his heart function has been stable since then with an improved ejection fraction of 40-45%. No chest pain or difficulty breathing. He is currently taking aspirin , Jardiance , Januvia , metoprolol , and spironolactone .  He has knee pain with swelling, which is affecting his ability to stay active.  He has experienced a loss of appetite and reports eating very little, leading to a decrease in muscle mass. He enjoys sweet foods and cheese dogs, but his overall intake is low.   No CP or SOB.           Problem List CAD s/p CABG -LIMA-LAD, SVG-OM, SVG-dRCA (04/26/2019) -patent grafts 03/08/2023 Systolic HF -EF 40-45% 08/10/2022 -EF 35-40% 06/19/2023 -EF 40-45% 08/07/2024 -s/p CRT-D -BP precludes GDMT VT s/p ablation  HTN HLD -T chol 120, HDL 63, LDL 35, TG 124 6. DM -A1c 6.6 7. Severe AS -34 mm Evolut Pro 08/06/2024    ROS: All other ROS reviewed and negative. Pertinent positives noted in the HPI.     Studies Reviewed: SABRA   EKG Interpretation Date/Time:  Tuesday September 17 2024 13:13:31 EST Ventricular Rate:  72 PR Interval:  120 QRS Duration:  194 QT  Interval:  494 QTC Calculation: 540 R Axis:   261  Text Interpretation: Atrial-sensed ventricular-paced rhythm When compared with ECG of 15-Aug-2024 13:02, Vent. rate has decreased BY   5 BPM Confirmed by Decent Darryle [phone]) on 09/17/2024 1:17:44 PM   LHC 07/19/2024 Severe three vessel CAD s/p 3V CABG  Severe disease in the proximal to mid LAD. Patent LIMA to LAD Chronic occlusion mid Circumflex. Patent vein graft to the obtuse marginal.  Severe mid RCA stenosis. Patent vein graft to the distal PDA  TTE 08/07/2024  1. Left ventricular ejection fraction, by estimation, is 40 to 45%. The  left ventricle has mildly decreased function. The left ventricle  demonstrates regional wall motion abnormalities (see scoring  diagram/findings for description). Left ventricular  diastolic parameters are consistent with Grade III diastolic dysfunction  (restrictive). Elevated left atrial pressure.   2. Right ventricular systolic function is normal. The right ventricular  size is normal.   3. Left atrial size was moderately dilated.   4. Right atrial size was moderately dilated.   5. The mitral valve is degenerative. Trivial mitral valve regurgitation.  No evidence of mitral stenosis. Moderate mitral annular calcification.   6. The aortic valve has been repaired/replaced. Aortic valve  regurgitation is not visualized. No aortic stenosis is present. There is a  34 stented (TAVR) valve present in the aortic position. Procedure Date:  08/06/2024. Echo findings are consistent with  normal structure and function of the  aortic valve prosthesis. Aortic valve  area, by VTI measures 2.08 cm. Aortic valve mean gradient measures 6.0  mmHg. Aortic valve Vmax measures 1.65 m/s.   7. The inferior vena cava is normal in size with greater than 50%  respiratory variability, suggesting right atrial pressure of 3 mmHg.   Physical Exam:   VS:  Ht 5' 10 (1.778 m)   Wt 172 lb 14.4 oz (78.4 kg)   BMI 24.81 kg/m     Wt Readings from Last 3 Encounters:  09/17/24 172 lb 14.4 oz (78.4 kg)  09/05/24 170 lb (77.1 kg)  08/30/24 170 lb (77.1 kg)    GEN: Well nourished, well developed in no acute distress NECK: No JVD; No carotid bruits CARDIAC: RRR, no murmurs, rubs, gallops RESPIRATORY:  Clear to auscultation without rales, wheezing or rhonchi  ABDOMEN: Soft, non-tender, non-distended EXTREMITIES:  No edema; No deformity  ASSESSMENT AND PLAN: .   Assessment and Plan    Preoperative cardiovascular assessment Cardiovascular status well-managed. Ejection fraction 45%. Bypass grafts patent. - Proceed with planned surgeries. - Hold aspirin  as needed before surgery.  Ischemic cardiomyopathy with reduced ejection fraction Ejection fraction 45%. No heart failure symptoms. Limited GDMT due to hypotension. Stable on current management. - Continue metoprolol  succinate 12.5 mg daily. - Continue spironolactone  12.5 mg daily. - Encouraged physical activity as tolerated.  Coronary artery disease, status post CABG Status post CABG with patent grafts. No angina. Stable on current management. - Continue aspirin  therapy.  Presence of prosthetic aortic valve (TAVR) Status post TAVR with 34 mm Evolut valve. Cleared for surgery. - Continue aspirin  therapy. Abx before dental work/surgery.   Presence of implantable cardiac defibrillator, CRT-D Implantable cardiac defibrillator in place. - Continue current management. - QRS is wide. May need to consider optimizing LV lead.   Hyperlipidemia LDL 35, well-controlled. Current regimen effective. - Continue current lipid-lowering therapy.                Follow-up: Return in about 6 months (around 03/17/2025).  Signed, Darryle DASEN. Barbaraann, MD, [facility]  [facility]  [address] [place], [place] [address] [phone]  1:44 PM   "

## 2024-09-17 ENCOUNTER — Encounter: Payer: Self-pay | Admitting: Cardiovascular Disease

## 2024-09-17 ENCOUNTER — Ambulatory Visit: Payer: Self-pay | Admitting: Physician Assistant

## 2024-09-17 ENCOUNTER — Ambulatory Visit: Admitting: Cardiovascular Disease

## 2024-09-17 ENCOUNTER — Ambulatory Visit (HOSPITAL_COMMUNITY)
Admission: RE | Admit: 2024-09-17 | Discharge: 2024-09-17 | Disposition: A | Discharge status: Home / Self Care | Source: Ambulatory Visit | Attending: Cardiology | Admitting: Cardiology

## 2024-09-17 VITALS — Ht 70.0 in | Wt 172.9 lb

## 2024-09-17 DIAGNOSIS — Z952 Presence of prosthetic heart valve: Secondary | ICD-10-CM | POA: Diagnosis not present

## 2024-09-17 DIAGNOSIS — I255 Ischemic cardiomyopathy: Secondary | ICD-10-CM

## 2024-09-17 DIAGNOSIS — E785 Hyperlipidemia, unspecified: Secondary | ICD-10-CM | POA: Diagnosis not present

## 2024-09-17 DIAGNOSIS — Z01810 Encounter for preprocedural cardiovascular examination: Secondary | ICD-10-CM | POA: Diagnosis not present

## 2024-09-17 DIAGNOSIS — Z95810 Presence of automatic (implantable) cardiac defibrillator: Secondary | ICD-10-CM | POA: Diagnosis not present

## 2024-09-17 LAB — ECHOCARDIOGRAM COMPLETE
AR max vel: 1.75 cm2
AV Area VTI: 1.67 cm2
AV Area mean vel: 1.6 cm2
AV Mean grad: 4 mmHg
AV Peak grad: 8.1 mmHg
Ao pk vel: 1.42 m/s
Area-P 1/2: 4.64 cm2
MV M vel: 3.77 m/s
MV Peak grad: 56.9 mmHg
MV VTI: 1.47 cm2
S' Lateral: 3.9 cm

## 2024-09-17 NOTE — Patient Instructions (Signed)
 Medication Instructions:  No changes *If you need a refill on your cardiac medications before your next appointment, please call your pharmacy*  Lab Work: None ordered If you have labs (blood work) drawn today and your tests are completely normal, you will receive your results only by: MyChart Message (if you have MyChart) OR A paper copy in the mail If you have any lab test that is abnormal or we need to change your treatment, we will call you to review the results.  Testing/Procedures: None ordered  Follow-Up: At [facility], you and your health needs are our priority.  As part of our continuing mission to provide you with exceptional heart care, our providers are all part of one team.  This team includes your primary Cardiologist (physician) and Advanced Practice Providers or APPs (Physician Assistants and Nurse Practitioners) who all work together to provide you with the care you need, when you need it.  Your next appointment:   6 mo with APP  1 yr with Dr Barbaraann  We recommend signing up for the patient portal called MyChart.  Sign up information is provided on this After Visit Summary.  MyChart is used to connect with patients for Virtual Visits (Telemedicine).  Patients are able to view lab/test results, encounter notes, upcoming appointments, etc.  Non-urgent messages can be sent to your provider as well.   To learn more about what you can do with MyChart, go to [email or web address].

## 2024-09-19 ENCOUNTER — Ambulatory Visit

## 2024-09-19 VITALS — Ht 70.0 in | Wt 167.0 lb

## 2024-09-19 DIAGNOSIS — Z0000 Encounter for general adult medical examination without abnormal findings: Secondary | ICD-10-CM | POA: Diagnosis not present

## 2024-09-19 NOTE — Patient Instructions (Signed)
 Cameron Daniel,  Thank you for taking the time for your Medicare Wellness Visit. I appreciate your continued commitment to your health goals. Please review the care plan we discussed, and feel free to reach out if I can assist you further.  Please note that Annual Wellness Visits do not include a physical exam. Some assessments may be limited, especially if the visit was conducted virtually. If needed, we may recommend an in-person follow-up with your provider.  Ongoing Care Seeing your primary care provider every 3 to 6 months helps us  monitor your health and provide consistent, personalized care.   Referrals If a referral was made during today's visit and you haven't received any updates within two weeks, please contact the referred provider directly to check on the status.  Recommended Screenings:  Health Maintenance  Topic Date Due   Kidney health urinalysis for diabetes  Never done   Hepatitis C Screening  Never done   Eye exam for diabetics  12/21/2023   COVID-19 Vaccine (7 - 2025-26 season) 04/29/2024   Hemoglobin A1C  12/09/2024   Yearly kidney function blood test for diabetes  08/07/2025   Medicare Annual Wellness Visit  09/19/2025   Pneumococcal Vaccine for age over 89  Completed   Flu Shot  Completed   Zoster (Shingles) Vaccine  Completed   Meningitis B Vaccine  Aged Out   DTaP/Tdap/Td vaccine  Discontinued   Complete foot exam   Discontinued   Colon Cancer Screening  Discontinued   Cologuard (Stool DNA test)  Discontinued       09/19/2024   11:41 AM  Advanced Directives  Does Patient Have a Medical Advance Directive? No  Would patient like information on creating a medical advance directive? Yes (MAU/Ambulatory/Procedural Areas - Information given)   Information on Advanced Care Planning can be found at [place]  Secretary of [facility] Advance Health Care Directives Advance Health Care Directives ([email or web address]   Vision: Annual vision screenings are recommended for  early detection of glaucoma, cataracts, and diabetic retinopathy. These exams can also reveal signs of chronic conditions such as diabetes and high blood pressure.  Dental: Annual dental screenings help detect early signs of oral cancer, gum disease, and other conditions linked to overall health, including heart disease and diabetes.  Please see the attached documents for additional preventive care recommendations.

## 2024-09-19 NOTE — Progress Notes (Signed)
 "  Chief Complaint  Patient presents with   Medicare Wellness     Subjective:   Cameron Daniel is a 79 y.o. male who presents for a Medicare Annual Wellness Visit.  Visit info / Clinical Intake: Medicare Wellness Visit Type:: Subsequent Annual Wellness Visit Persons participating in visit and providing information:: patient Medicare Wellness Visit Mode:: Telephone If telephone:: video declined Since this visit was completed virtually, some vitals may be partially provided or unavailable. Missing vitals are due to the limitations of the virtual format.: Documented vitals are patient reported If Telephone or Video please confirm:: I connected with patient using audio/video enable telemedicine. I verified patient identity with two identifiers, discussed telehealth limitations, and patient agreed to proceed. Patient Location:: home Provider Location:: office Interpreter Needed?: No Pre-visit prep was completed: yes AWV questionnaire completed by patient prior to visit?: no Living arrangements:: lives with spouse/significant other Patient's Overall Health Status Rating: good Typical amount of pain: some Does pain affect daily life?: no Are you currently prescribed opioids?: no  Dietary Habits and Nutritional Risks How many meals a day?: 3 Eats fruit and vegetables daily?: yes Most meals are obtained by: having others provide food In the last 2 weeks, have you had any of the following?: unintentional weight gain Diabetic:: (!) yes Any non-healing wounds?: no How often do you check your BS?: as needed Would you like to be referred to a Nutritionist or for Diabetic Management? : no  Functional Status Activities of Daily Living (to include ambulation/medication): Independent Ambulation: Independent Medication Administration: Independent Home Management (perform basic housework or laundry): Independent Manage your own finances?: yes Primary transportation is: driving Concerns  about vision?: no *vision screening is required for WTM* Concerns about hearing?: no  Fall Screening Falls in the past year?: 0 Number of falls in past year: 0 Was there an injury with Fall?: 0 Fall Risk Category Calculator: 0 Patient Fall Risk Level: Low Fall Risk  Fall Risk Patient at Risk for Falls Due to: No Fall Risks Fall risk Follow up: Falls evaluation completed; Education provided; Falls prevention discussed  Home and Transportation Safety: All rugs have non-skid backing?: yes All stairs or steps have railings?: yes Grab bars in the bathtub or shower?: yes Have non-skid surface in bathtub or shower?: yes Good home lighting?: yes Regular seat belt use?: yes Hospital stays in the last year:: (!) yes How many hospital stays:: 2 Reason: Cardiac  Cognitive Assessment Difficulty concentrating, remembering, or making decisions? : no Will 6CIT or Mini Cog be Completed: yes What year is it?: 0 points What month is it?: 0 points Give patient an address phrase to remember (5 components): [address] [place] [address] About what time is it?: 0 points Count backwards from 20 to 1: 0 points Say the months of the year in reverse: 0 points Repeat the address phrase from earlier: 2 points 6 CIT Score: 2 points  Advance Directives (For Healthcare) Does Patient Have a Medical Advance Directive?: No Does patient want to make changes to medical advance directive?: No - Guardian declined Type of Advance Directive: Healthcare Power of [place]; Living will Copy of Healthcare Power of Attorney in Chart?: No - copy requested Would patient like information on creating a medical advance directive?: Yes (MAU/Ambulatory/Procedural Areas - Information given)  Reviewed/Updated  Reviewed/Updated: Reviewed All (Medical, Surgical, Family, Medications, Allergies, Care Teams, Patient Goals)    Allergies (verified) Doxazosin mesylate, Losartan , Testosterone , Flomax  [tamsulosin  hcl],  Penicillins, Sulfonamide derivatives, and Trulicity  [dulaglutide ]  Current Medications (verified) Outpatient Encounter Medications as of 09/19/2024  Medication Sig   acetaminophen  (TYLENOL ) 500 MG tablet Take 1,000 mg by mouth every 6 (six) hours as needed for mild pain (pain score 1-3).   aspirin  EC 81 MG tablet Take 1 tablet (81 mg total) by mouth daily.   citalopram  (CELEXA ) 10 MG tablet Take 1 tablet (10 mg total) by mouth daily.   docusate sodium  (COLACE) 100 MG capsule Take 100-200 mg by mouth 2 (two) times daily as needed (constipation).   finasteride  (PROSCAR ) 5 MG tablet TAKE 1 TABLET BY MOUTH EVERY DAY   metoprolol  succinate (TOPROL  XL) 25 MG 24 hr tablet Take 0.5 tablets (12.5 mg total) by mouth daily.   montelukast  (SINGULAIR ) 10 MG tablet TAKE 1 TABLET BY MOUTH EVERYDAY AT BEDTIME   nitroGLYCERIN  (NITROSTAT ) 0.4 MG SL tablet Place 1 tablet (0.4 mg total) under the tongue every 5 (five) minutes x 3 doses as needed for chest pain.   olopatadine  (PATADAY ) 0.1 % ophthalmic solution Place 1 drop into both eyes 2 (two) times daily as needed for allergies.   pantoprazole  (PROTONIX ) 40 MG tablet TAKE 1 TABLET BY MOUTH TWICE A DAY   rosuvastatin  (CRESTOR ) 40 MG tablet Take 1 tablet (40 mg total) by mouth daily.   sitaGLIPtin  (JANUVIA ) 100 MG tablet Take 100 mg by mouth in the morning.   spironolactone  (ALDACTONE ) 25 MG tablet TAKE 1/2 TABLET BY MOUTH EVERY DAY   empagliflozin  (JARDIANCE ) 25 MG TABS tablet TAKE 25 MG (1 TABLET) BY MOUTH DAILY BEFORE BREAKFAST. STOP PIOGLITAZONE  (Patient not taking: Reported on 09/19/2024)   No facility-administered encounter medications on file as of 09/19/2024.    History: Past Medical History:  Diagnosis Date   Anxiety    Arthritis    Cataract    Chronic combined systolic (congestive) and diastolic (congestive) heart failure (HCC)    Coronary atherosclerosis of unspecified type of vessel, native or graft    a. MI 1985 with unclear details. b. CABG  03/2019 with LIMA -> LAD, SVG -> OM, SVG to dRCA.   Depression    PTSD    Diverticulosis of colon (without mention of hemorrhage)    Esophageal reflux    Esophageal stricture    Essential hypertension    Former tobacco use    Hemorrhoids    Hyperlipidemia    ICD (implantable cardioverter-defibrillator) in place    Irritable bowel syndrome    Ischemic cardiomyopathy    Mild carotid artery disease    a. 1-39% bilaterally 03/2019 duplex.   Nocturia    Obesity, unspecified    Orthostasis    Personal history of colonic polyps    PTSD (post-traumatic stress disorder)    Retroperitoneal bleed    Type II or unspecified type diabetes mellitus without mention of complication, not stated as uncontrolled    VT (ventricular tachycardia) (HCC)    Past Surgical History:  Procedure Laterality Date   ADENOIDECTOMY     CARPAL TUNNEL RELEASE     left    CHOLECYSTECTOMY     COLON RESECTION     18 inches   COLON SURGERY  2013   CORONARY ANGIOPLASTY     1985    CORONARY ARTERY BYPASS GRAFT N/A 04/26/2019   Procedure: CORONARY ARTERY BYPASS GRAFTING (CABG) x Three, using left internal mammary artery and right leg greater saphenous vein harvested endoscopically;  Surgeon: Lucas Dorise POUR, MD;  Location: [facility] OR;  Service: Open Heart Surgery;  Laterality: N/A;  CORONARY/GRAFT ANGIOGRAPHY N/A 07/19/2024   Procedure: CORONARY/GRAFT ANGIOGRAPHY;  Surgeon: Verlin Lonni BIRCH, MD;  Location: [facility] INVASIVE CV LAB;  Service: Cardiovascular;  Laterality: N/A;   ELBOW BURSA SURGERY     EYE SURGERY     ICD GENERATOR CHANGEOUT N/A 12/28/2023   Procedure: ICD GENERATOR CHANGEOUT;  Surgeon: Cindie Ole DASEN, MD;  Location: [facility] INVASIVE CV LAB;  Service: Cardiovascular;  Laterality: N/A;   ICD IMPLANT N/A 06/25/2020   Procedure: ICD IMPLANT;  Surgeon: Cindie Ole DASEN, MD;  Location: [facility] INVASIVE CV LAB;  Service: Cardiovascular;  Laterality: N/A;   INTRAOPERATIVE TRANSTHORACIC ECHOCARDIOGRAM N/A 08/06/2024    Procedure: ECHOCARDIOGRAM, TRANSTHORACIC;  Surgeon: Wendel Lurena POUR, MD;  Location: [facility] INVASIVE CV LAB;  Service: Cardiovascular;  Laterality: N/A;   LEAD INSERTION N/A 12/28/2023   Procedure: LEAD INSERTION;  Surgeon: Cindie Ole DASEN, MD;  Location: [facility] INVASIVE CV LAB;  Service: Cardiovascular;  Laterality: N/A;   LEFT HEART CATH AND CORS/GRAFTS ANGIOGRAPHY N/A 06/19/2020   Procedure: LEFT HEART CATH AND CORS/GRAFTS ANGIOGRAPHY;  Surgeon: Burnard Debby LABOR, MD;  Location: [facility] INVASIVE CV LAB;  Service: Cardiovascular;  Laterality: N/A;   LEFT HEART CATH AND CORS/GRAFTS ANGIOGRAPHY N/A 03/08/2023   Procedure: LEFT HEART CATH AND CORS/GRAFTS ANGIOGRAPHY;  Surgeon: Darron Deatrice LABOR, MD;  Location: [facility] INVASIVE CV LAB;  Service: Cardiovascular;  Laterality: N/A;   POLYPECTOMY     RIGHT/LEFT HEART CATH AND CORONARY ANGIOGRAPHY N/A 04/16/2019   Procedure: RIGHT/LEFT HEART CATH AND CORONARY ANGIOGRAPHY;  Surgeon: Claudene Victory ORN, MD;  Location: [facility] INVASIVE CV LAB;  Service: Cardiovascular;  Laterality: N/A;   SHOULDER SURGERY     Rt. Shoulder   TEE WITHOUT CARDIOVERSION N/A 04/26/2019   Procedure: TRANSESOPHAGEAL ECHOCARDIOGRAM (TEE);  Surgeon: Lucas Dorise POUR, MD;  Location: [facility] OR;  Service: Open Heart Surgery;  Laterality: N/A;   TONSILLECTOMY     V TACH ABLATION N/A 06/22/2020   Procedure: V TACH ABLATION;  Surgeon: Cindie Ole DASEN, MD;  Location: [facility] INVASIVE CV LAB;  Service: Cardiovascular;  Laterality: N/A;   Family History  Problem Relation Age of Onset   Lung cancer Father    Allergic rhinitis Father    Coronary artery disease Mother    Colon cancer Neg Hx    Heart attack Neg Hx    Hypertension Neg Hx    Stroke Neg Hx    Esophageal cancer Neg Hx    Rectal cancer Neg Hx    Stomach cancer Neg Hx    Pancreatic cancer Neg Hx    Social History   Occupational History   Occupation: Therapist, Sports- retired  Tobacco Use   Smoking status: Former    Current packs/day: 0.00    Types: Cigarettes     Quit date: 08/30/1983    Years since quitting: 41.0   Smokeless tobacco: Former   Tobacco comments:    Quit 30 years ago.  Vaping Use   Vaping status: Never Used  Substance and Sexual Activity   Alcohol use: Not Currently    Comment: rare   Drug use: No   Sexual activity: Yes   Tobacco Counseling Counseling given: Not Answered Tobacco comments: Quit 30 years ago.  SDOH Screenings   Food Insecurity: No Food Insecurity (09/19/2024)  Housing: Low Risk (09/19/2024)  Transportation Needs: No Transportation Needs (09/19/2024)  Utilities: Not At Risk (09/19/2024)  Alcohol Screen: Low Risk (12/22/2022)  Depression (PHQ2-9): Low Risk (09/19/2024)  Financial Resource Strain: Low Risk (12/22/2022)  Physical Activity: Insufficiently Active (  09/19/2024)  Social Connections: Moderately Isolated (09/19/2024)  Stress: No Stress Concern Present (09/19/2024)  Tobacco Use: Medium Risk (09/19/2024)  Health Literacy: Adequate Health Literacy (09/19/2024)   See flowsheets for full screening details  Depression Screen PHQ 2 & 9 Depression Scale- Over the past 2 weeks, how often have you been bothered by any of the following problems? Little interest or pleasure in doing things: 0 Feeling down, depressed, or hopeless (PHQ Adolescent also includes...irritable): 0 PHQ-2 Total Score: 0 Trouble falling or staying asleep, or sleeping too much: 0 Feeling tired or having little energy: 0 Poor appetite or overeating (PHQ Adolescent also includes...weight loss): 0 Feeling bad about yourself - or that you are a failure or have let yourself or your family down: 0 Trouble concentrating on things, such as reading the newspaper or watching television (PHQ Adolescent also includes...like school work): 0 Moving or speaking so slowly that other people could have noticed. Or the opposite - being so fidgety or restless that you have been moving around a lot more than usual: 0 Thoughts that you would be better off dead, or  of hurting yourself in some way: 0 PHQ-9 Total Score: 0 If you checked off any problems, how difficult have these problems made it for you to do your work, take care of things at home, or get along with other people?: Not difficult at all  Depression Treatment Depression Interventions/Treatment : EYV7-0 Score <4 Follow-up Not Indicated     Goals Addressed             This Visit's Progress    COMPLETED: Exercise 3x per week (30 min per time)       Increase exercise as tolerated. Get boat fixed.      Maintain independence   On track            Objective:    Today's Vitals   09/19/24 1113  Weight: 167 lb (75.8 kg)  Height: 5' 10 (1.778 m)   Body mass index is 23.96 kg/m.  Hearing/Vision screen No results found. Immunizations and Health Maintenance Health Maintenance  Topic Date Due   Diabetic kidney evaluation - Urine ACR  Never done   Hepatitis C Screening  Never done   OPHTHALMOLOGY EXAM  12/21/2023   COVID-19 Vaccine (7 - 2025-26 season) 04/29/2024   HEMOGLOBIN A1C  12/09/2024   Diabetic kidney evaluation - eGFR measurement  08/07/2025   Medicare Annual Wellness (AWV)  09/19/2025   Pneumococcal Vaccine: 50+ Years  Completed   Influenza Vaccine  Completed   Zoster Vaccines- Shingrix   Completed   Meningococcal B Vaccine  Aged Out   DTaP/Tdap/Td  Discontinued   FOOT EXAM  Discontinued   Colonoscopy  Discontinued   Fecal DNA (Cologuard)  Discontinued        Assessment/Plan:  This is a routine wellness examination for Cameron Daniel.  Patient Care Team: Duanne Butler DASEN, MD as PCP - General (Family Medicine) Bensimhon, Toribio SAUNDERS, MD as PCP - Advanced Heart Failure (Cardiology) O'Neal, Darryle Ned, MD as PCP - Cardiology (Cardiology) Wendel Lurena POUR, MD as PCP - Structural Heart (Cardiology) Center, [facility] as Referring Physician (General Practice) Cleotilde Norleen BIRCH, OD (Optometry) Rana Lum CROME, NP as Nurse Practitioner (Cardiology) Dartha Ernst, MD as Consulting Physician (Endocrinology) Iva Marty Saltness, MD as Consulting Physician (Allergy  and Immunology) Melodi Lerner, MD as Consulting Physician (Orthopedic Surgery) Kinsinger, Herlene Righter, MD as Consulting Physician (General Surgery)  I have personally reviewed and noted the following  in the patients chart:   Medical and social history Use of alcohol, tobacco or illicit drugs  Current medications and supplements including opioid prescriptions. Functional ability and status Nutritional status Physical activity Advanced directives List of other physicians Hospitalizations, surgeries, and ER visits in previous 12 months Vitals Screenings to include cognitive, depression, and falls Referrals and appointments  No orders of the defined types were placed in this encounter.  In addition, I have reviewed and discussed with patient certain preventive protocols, quality metrics, and best practice recommendations. A written personalized care plan for preventive services as well as general preventive health recommendations were provided to patient.   Lavelle Charmaine Browner, [place]   8/77/7973   Return in 1 year (on 09/19/2025).  After Visit Summary: (Mail) Due to this being a telephonic visit, the after visit summary with patients personalized plan was offered to patient via mail   Nurse Notes: HM Addressed: Request sent for last diabetic eye exam FYI: Patient is scheduled to see surgeon for thyroid  in February, states that he continues to lose weight   "

## 2024-09-20 NOTE — Progress Notes (Signed)
 NYHA 1, per Izetta Hummer PA-C

## 2024-09-30 ENCOUNTER — Ambulatory Visit

## 2024-10-01 LAB — CUP PACEART REMOTE DEVICE CHECK
Battery Remaining Longevity: 92 mo
Battery Voltage: 2.98 V
Brady Statistic AP VP Percent: 1.52 %
Brady Statistic AP VS Percent: 0.01 %
Brady Statistic AS VP Percent: 97.92 %
Brady Statistic AS VS Percent: 0.56 %
Brady Statistic RA Percent Paced: 1.79 %
Brady Statistic RV Percent Paced: 99.43 %
Date Time Interrogation Session: 20260201223319
HighPow Impedance: 60 Ohm
Implantable Lead Connection Status: 753985
Implantable Lead Connection Status: 753985
Implantable Lead Connection Status: 753985
Implantable Lead Implant Date: 20211028
Implantable Lead Implant Date: 20250501
Implantable Lead Implant Date: 20250501
Implantable Lead Location: 753858
Implantable Lead Location: 753859
Implantable Lead Location: 753860
Implantable Lead Model: 4598
Implantable Lead Model: 5076
Implantable Pulse Generator Implant Date: 20250501
Lead Channel Impedance Value: 304 Ohm
Lead Channel Impedance Value: 342 Ohm
Lead Channel Impedance Value: 399 Ohm
Lead Channel Impedance Value: 399 Ohm
Lead Channel Impedance Value: 418 Ohm
Lead Channel Impedance Value: 437 Ohm
Lead Channel Impedance Value: 532 Ohm
Lead Channel Impedance Value: 532 Ohm
Lead Channel Impedance Value: 627 Ohm
Lead Channel Impedance Value: 665 Ohm
Lead Channel Impedance Value: 684 Ohm
Lead Channel Impedance Value: 779 Ohm
Lead Channel Impedance Value: 798 Ohm
Lead Channel Pacing Threshold Amplitude: 0.5 V
Lead Channel Pacing Threshold Amplitude: 0.625 V
Lead Channel Pacing Threshold Amplitude: 1 V
Lead Channel Pacing Threshold Pulse Width: 0.4 ms
Lead Channel Pacing Threshold Pulse Width: 0.4 ms
Lead Channel Pacing Threshold Pulse Width: 0.8 ms
Lead Channel Sensing Intrinsic Amplitude: 2.3 mV
Lead Channel Sensing Intrinsic Amplitude: 5.6 mV
Lead Channel Setting Pacing Amplitude: 1.5 V
Lead Channel Setting Pacing Amplitude: 2 V
Lead Channel Setting Pacing Amplitude: 2 V
Lead Channel Setting Pacing Pulse Width: 0.4 ms
Lead Channel Setting Pacing Pulse Width: 0.8 ms
Lead Channel Setting Sensing Sensitivity: 0.3 mV
Zone Setting Status: 755011
Zone Setting Status: 755011

## 2024-10-03 ENCOUNTER — Telehealth: Payer: Self-pay

## 2024-10-03 ENCOUNTER — Telehealth: Payer: Self-pay | Admitting: Family Medicine

## 2024-10-03 ENCOUNTER — Other Ambulatory Visit: Payer: Self-pay

## 2024-10-03 DIAGNOSIS — K219 Gastro-esophageal reflux disease without esophagitis: Secondary | ICD-10-CM

## 2024-10-03 DIAGNOSIS — R140 Abdominal distension (gaseous): Secondary | ICD-10-CM

## 2024-10-03 DIAGNOSIS — R6881 Early satiety: Secondary | ICD-10-CM

## 2024-10-03 DIAGNOSIS — Z01810 Encounter for preprocedural cardiovascular examination: Secondary | ICD-10-CM

## 2024-10-03 MED ORDER — PANTOPRAZOLE SODIUM 40 MG PO TBEC: 40.0000 mg | DELAYED_RELEASE_TABLET | Freq: Two times a day (BID) | ORAL | 3 refills | Status: AC

## 2024-10-03 NOTE — Telephone Encounter (Signed)
" ° °  [name]  DOB: 08-16-1946 MRN: [id number]  Primary Cardiologist: Darryle ONEIDA Decent, MD  Chart reviewed as part of pre-operative protocol coverage. Pre-op clearance already addressed by colleagues in earlier phone notes. To summarize recommendations:  Preoperative cardiovascular assessment Cardiovascular status well-managed. Ejection fraction 45%. Bypass grafts patent. - Proceed with planned surgeries. - Hold aspirin  as needed before surgery.  -Dr. Decent  Will route this bundled recommendation to requesting provider via Epic fax function and remove from pre-op pool. Please call with questions.  Orren LOISE Fabry, PA-C 10/03/2024, 3:30 PM  "

## 2024-10-03 NOTE — Telephone Encounter (Signed)
 Prescription Request  10/03/2024  LOV: 06/13/2024  What is the name of the medication or equipment? pantoprazole  (PROTONIX ) 40 MG tablet   Have you contacted your pharmacy to request a refill? Yes   Which pharmacy would you like this sent to?  [facility] [address] [place] - [address] [place] [address] Phone: [phone] Fax: [phone]    Patient notified that their request is being sent to the clinical staff for review and that they should receive a response within 2 business days.   Please advise at [facility] [phone]

## 2024-10-03 NOTE — Telephone Encounter (Signed)
"  ° °  Pre-operative Risk Assessment    [name]  DOB: August 28, 1946 MRN: [id number]   Date of last office visit: 09/17/24 Date of next office visit: Not scheduled   Request for Surgical Clearance    Procedure:  Parathyroidectomy  Date of Surgery:  Clearance TBD                                Surgeon:  Herlene Bureau, MD Surgeon's Group or Practice Name:  [facility] Phone number:  [phone] Fax number:  [phone]   Type of Clearance Requested:   - Medical  - Pharmacy:  Hold Aspirin      Type of Anesthesia:  General    Additional requests/questions:    [name]   10/03/2024, 3:12 PM   "

## 2024-10-04 ENCOUNTER — Telehealth: Payer: Self-pay

## 2024-10-04 NOTE — Telephone Encounter (Signed)
 Copied from CRM [id number]. Topic: Clinical - Medical Advice >> Oct 04, 2024 11:20 AM Montie POUR wrote: Reason for CRM:  Mr. Messina would like for Dr. Duanne to call him to answer a question about his thyroid . He wants to know about a surgeon that is going to do thyroid  and Mr. Littlefield does not know anything about the surgeon. Please call him at [phone] or [phone] to discuss

## 2024-12-30 ENCOUNTER — Encounter

## 2025-03-31 ENCOUNTER — Encounter

## 2025-06-30 ENCOUNTER — Encounter

## 2025-09-29 ENCOUNTER — Encounter

## 2025-12-29 ENCOUNTER — Encounter
